# Patient Record
Sex: Female | Born: 1955 | Race: White | Hispanic: No | Marital: Single | Smoking: Current every day smoker
Health system: Southern US, Community
[De-identification: ages and names within clinical notes are randomized; demographics above are authoritative.]

## PROBLEM LIST (undated history)

## (undated) ENCOUNTER — Emergency Department (HOSPITAL_COMMUNITY): Admission: EM | Payer: Self-pay | Source: Home / Self Care

## (undated) DIAGNOSIS — R569 Unspecified convulsions: Secondary | ICD-10-CM

## (undated) DIAGNOSIS — M5020 Other cervical disc displacement, unspecified cervical region: Secondary | ICD-10-CM

## (undated) DIAGNOSIS — K859 Acute pancreatitis without necrosis or infection, unspecified: Secondary | ICD-10-CM

## (undated) DIAGNOSIS — G8929 Other chronic pain: Secondary | ICD-10-CM

## (undated) DIAGNOSIS — F32A Depression, unspecified: Secondary | ICD-10-CM

## (undated) DIAGNOSIS — K5792 Diverticulitis of intestine, part unspecified, without perforation or abscess without bleeding: Secondary | ICD-10-CM

## (undated) DIAGNOSIS — F1020 Alcohol dependence, uncomplicated: Secondary | ICD-10-CM

## (undated) DIAGNOSIS — F329 Major depressive disorder, single episode, unspecified: Secondary | ICD-10-CM

## (undated) DIAGNOSIS — F419 Anxiety disorder, unspecified: Secondary | ICD-10-CM

## (undated) HISTORY — PX: CERVICAL FUSION: SHX112

## (undated) HISTORY — PX: CHOLECYSTECTOMY: SHX55

## (undated) HISTORY — PX: CARPAL TUNNEL RELEASE: SHX101

## (undated) HISTORY — PX: ANKLE SURGERY: SHX546

## (undated) HISTORY — PX: KNEE SURGERY: SHX244

## (undated) HISTORY — PX: ABDOMINAL HYSTERECTOMY: SHX81

---

## 1998-02-06 ENCOUNTER — Ambulatory Visit (HOSPITAL_COMMUNITY): Admission: RE | Admit: 1998-02-06 | Discharge: 1998-02-06 | Payer: Self-pay | Admitting: *Deleted

## 1998-07-07 ENCOUNTER — Emergency Department (HOSPITAL_COMMUNITY): Admission: EM | Admit: 1998-07-07 | Discharge: 1998-07-07 | Payer: Self-pay | Admitting: Emergency Medicine

## 1998-07-11 ENCOUNTER — Encounter: Payer: Self-pay | Admitting: Gastroenterology

## 1998-07-11 ENCOUNTER — Inpatient Hospital Stay (HOSPITAL_COMMUNITY): Admission: EM | Admit: 1998-07-11 | Discharge: 1998-07-14 | Payer: Self-pay | Admitting: Gastroenterology

## 1998-07-12 ENCOUNTER — Encounter: Payer: Self-pay | Admitting: Gastroenterology

## 1998-08-16 ENCOUNTER — Encounter: Payer: Self-pay | Admitting: Gastroenterology

## 1998-08-16 ENCOUNTER — Ambulatory Visit (HOSPITAL_COMMUNITY): Admission: RE | Admit: 1998-08-16 | Discharge: 1998-08-16 | Payer: Self-pay | Admitting: Gastroenterology

## 1998-08-17 ENCOUNTER — Emergency Department (HOSPITAL_COMMUNITY): Admission: EM | Admit: 1998-08-17 | Discharge: 1998-08-17 | Payer: Self-pay | Admitting: Emergency Medicine

## 1998-08-17 ENCOUNTER — Encounter: Payer: Self-pay | Admitting: Emergency Medicine

## 1998-08-21 ENCOUNTER — Encounter: Payer: Self-pay | Admitting: Gastroenterology

## 1998-08-21 ENCOUNTER — Ambulatory Visit (HOSPITAL_COMMUNITY): Admission: RE | Admit: 1998-08-21 | Discharge: 1998-08-21 | Payer: Self-pay | Admitting: Gastroenterology

## 2002-01-29 ENCOUNTER — Emergency Department (HOSPITAL_COMMUNITY): Admission: EM | Admit: 2002-01-29 | Discharge: 2002-01-29 | Payer: Self-pay | Admitting: Emergency Medicine

## 2002-03-13 ENCOUNTER — Emergency Department (HOSPITAL_COMMUNITY): Admission: EM | Admit: 2002-03-13 | Discharge: 2002-03-13 | Payer: Self-pay | Admitting: Emergency Medicine

## 2002-03-13 ENCOUNTER — Encounter: Payer: Self-pay | Admitting: Emergency Medicine

## 2002-03-21 ENCOUNTER — Emergency Department (HOSPITAL_COMMUNITY): Admission: EM | Admit: 2002-03-21 | Discharge: 2002-03-21 | Payer: Self-pay | Admitting: Emergency Medicine

## 2002-04-03 ENCOUNTER — Emergency Department (HOSPITAL_COMMUNITY): Admission: EM | Admit: 2002-04-03 | Discharge: 2002-04-03 | Payer: Self-pay | Admitting: Emergency Medicine

## 2002-10-22 ENCOUNTER — Emergency Department (HOSPITAL_COMMUNITY): Admission: EM | Admit: 2002-10-22 | Discharge: 2002-10-22 | Payer: Self-pay | Admitting: Emergency Medicine

## 2002-11-05 ENCOUNTER — Encounter: Payer: Self-pay | Admitting: *Deleted

## 2002-11-05 ENCOUNTER — Emergency Department (HOSPITAL_COMMUNITY): Admission: EM | Admit: 2002-11-05 | Discharge: 2002-11-05 | Payer: Self-pay | Admitting: Emergency Medicine

## 2002-12-10 ENCOUNTER — Emergency Department (HOSPITAL_COMMUNITY): Admission: EM | Admit: 2002-12-10 | Discharge: 2002-12-10 | Payer: Self-pay | Admitting: Emergency Medicine

## 2002-12-13 ENCOUNTER — Ambulatory Visit (HOSPITAL_BASED_OUTPATIENT_CLINIC_OR_DEPARTMENT_OTHER): Admission: RE | Admit: 2002-12-13 | Discharge: 2002-12-13 | Payer: Self-pay | Admitting: Orthopedic Surgery

## 2002-12-16 ENCOUNTER — Emergency Department (HOSPITAL_COMMUNITY): Admission: EM | Admit: 2002-12-16 | Discharge: 2002-12-16 | Payer: Self-pay | Admitting: Emergency Medicine

## 2002-12-16 ENCOUNTER — Encounter: Payer: Self-pay | Admitting: Emergency Medicine

## 2002-12-23 ENCOUNTER — Emergency Department (HOSPITAL_COMMUNITY): Admission: EM | Admit: 2002-12-23 | Discharge: 2002-12-23 | Payer: Self-pay | Admitting: Emergency Medicine

## 2002-12-28 ENCOUNTER — Emergency Department (HOSPITAL_COMMUNITY): Admission: EM | Admit: 2002-12-28 | Discharge: 2002-12-28 | Payer: Self-pay | Admitting: Emergency Medicine

## 2002-12-28 ENCOUNTER — Emergency Department (HOSPITAL_COMMUNITY): Admission: EM | Admit: 2002-12-28 | Discharge: 2002-12-29 | Payer: Self-pay | Admitting: *Deleted

## 2002-12-28 ENCOUNTER — Encounter: Payer: Self-pay | Admitting: Emergency Medicine

## 2003-01-13 ENCOUNTER — Emergency Department (HOSPITAL_COMMUNITY): Admission: EM | Admit: 2003-01-13 | Discharge: 2003-01-13 | Payer: Self-pay | Admitting: Emergency Medicine

## 2003-02-02 ENCOUNTER — Emergency Department (HOSPITAL_COMMUNITY): Admission: EM | Admit: 2003-02-02 | Discharge: 2003-02-02 | Payer: Self-pay | Admitting: Emergency Medicine

## 2003-02-03 ENCOUNTER — Encounter: Payer: Self-pay | Admitting: Emergency Medicine

## 2003-02-16 ENCOUNTER — Encounter: Payer: Self-pay | Admitting: Emergency Medicine

## 2003-02-16 ENCOUNTER — Emergency Department (HOSPITAL_COMMUNITY): Admission: EM | Admit: 2003-02-16 | Discharge: 2003-02-16 | Payer: Self-pay | Admitting: Emergency Medicine

## 2003-03-19 ENCOUNTER — Emergency Department (HOSPITAL_COMMUNITY): Admission: EM | Admit: 2003-03-19 | Discharge: 2003-03-19 | Payer: Self-pay | Admitting: Emergency Medicine

## 2003-03-25 ENCOUNTER — Emergency Department (HOSPITAL_COMMUNITY): Admission: EM | Admit: 2003-03-25 | Discharge: 2003-03-25 | Payer: Self-pay | Admitting: Emergency Medicine

## 2003-05-13 ENCOUNTER — Emergency Department (HOSPITAL_COMMUNITY): Admission: EM | Admit: 2003-05-13 | Discharge: 2003-05-13 | Payer: Self-pay | Admitting: Emergency Medicine

## 2005-04-10 ENCOUNTER — Emergency Department (HOSPITAL_COMMUNITY): Admission: EM | Admit: 2005-04-10 | Discharge: 2005-04-10 | Payer: Self-pay | Admitting: Emergency Medicine

## 2005-04-22 ENCOUNTER — Emergency Department (HOSPITAL_COMMUNITY): Admission: EM | Admit: 2005-04-22 | Discharge: 2005-04-22 | Payer: Self-pay | Admitting: Emergency Medicine

## 2005-04-23 ENCOUNTER — Emergency Department (HOSPITAL_COMMUNITY): Admission: EM | Admit: 2005-04-23 | Discharge: 2005-04-23 | Payer: Self-pay | Admitting: Emergency Medicine

## 2005-04-27 ENCOUNTER — Emergency Department (HOSPITAL_COMMUNITY): Admission: EM | Admit: 2005-04-27 | Discharge: 2005-04-27 | Payer: Self-pay | Admitting: Emergency Medicine

## 2005-05-01 ENCOUNTER — Emergency Department (HOSPITAL_COMMUNITY): Admission: EM | Admit: 2005-05-01 | Discharge: 2005-05-02 | Payer: Self-pay | Admitting: Emergency Medicine

## 2005-05-03 ENCOUNTER — Emergency Department (HOSPITAL_COMMUNITY): Admission: EM | Admit: 2005-05-03 | Discharge: 2005-05-04 | Payer: Self-pay | Admitting: *Deleted

## 2005-05-05 ENCOUNTER — Inpatient Hospital Stay (HOSPITAL_COMMUNITY): Admission: EM | Admit: 2005-05-05 | Discharge: 2005-05-11 | Payer: Self-pay | Admitting: Emergency Medicine

## 2005-05-16 ENCOUNTER — Emergency Department (HOSPITAL_COMMUNITY): Admission: EM | Admit: 2005-05-16 | Discharge: 2005-05-16 | Payer: Self-pay | Admitting: Emergency Medicine

## 2005-05-20 ENCOUNTER — Emergency Department (HOSPITAL_COMMUNITY): Admission: EM | Admit: 2005-05-20 | Discharge: 2005-05-20 | Payer: Self-pay | Admitting: Emergency Medicine

## 2005-06-09 ENCOUNTER — Emergency Department (HOSPITAL_COMMUNITY): Admission: EM | Admit: 2005-06-09 | Discharge: 2005-06-10 | Payer: Self-pay | Admitting: Emergency Medicine

## 2005-07-07 ENCOUNTER — Emergency Department (HOSPITAL_COMMUNITY): Admission: EM | Admit: 2005-07-07 | Discharge: 2005-07-08 | Payer: Self-pay | Admitting: Emergency Medicine

## 2005-11-06 ENCOUNTER — Emergency Department (HOSPITAL_COMMUNITY): Admission: EM | Admit: 2005-11-06 | Discharge: 2005-11-06 | Payer: Self-pay | Admitting: Emergency Medicine

## 2005-12-08 ENCOUNTER — Emergency Department (HOSPITAL_COMMUNITY): Admission: EM | Admit: 2005-12-08 | Discharge: 2005-12-09 | Payer: Self-pay | Admitting: Emergency Medicine

## 2005-12-28 ENCOUNTER — Emergency Department (HOSPITAL_COMMUNITY): Admission: EM | Admit: 2005-12-28 | Discharge: 2005-12-29 | Payer: Self-pay | Admitting: Emergency Medicine

## 2006-01-31 ENCOUNTER — Inpatient Hospital Stay (HOSPITAL_COMMUNITY): Admission: EM | Admit: 2006-01-31 | Discharge: 2006-02-04 | Payer: Self-pay | Admitting: Emergency Medicine

## 2006-02-04 ENCOUNTER — Inpatient Hospital Stay (HOSPITAL_COMMUNITY): Admission: RE | Admit: 2006-02-04 | Discharge: 2006-02-11 | Payer: Self-pay | Admitting: Psychiatry

## 2006-02-05 ENCOUNTER — Ambulatory Visit: Payer: Self-pay | Admitting: Psychiatry

## 2006-02-17 ENCOUNTER — Encounter: Payer: Self-pay | Admitting: Gastroenterology

## 2006-02-23 ENCOUNTER — Emergency Department (HOSPITAL_COMMUNITY): Admission: EM | Admit: 2006-02-23 | Discharge: 2006-02-23 | Payer: Self-pay | Admitting: Emergency Medicine

## 2006-10-22 ENCOUNTER — Encounter: Payer: Self-pay | Admitting: Emergency Medicine

## 2006-10-23 ENCOUNTER — Inpatient Hospital Stay (HOSPITAL_COMMUNITY): Admission: EM | Admit: 2006-10-23 | Discharge: 2006-10-27 | Payer: Self-pay | Admitting: Internal Medicine

## 2007-01-03 ENCOUNTER — Emergency Department (HOSPITAL_COMMUNITY): Admission: EM | Admit: 2007-01-03 | Discharge: 2007-01-03 | Payer: Self-pay | Admitting: Emergency Medicine

## 2007-01-03 ENCOUNTER — Inpatient Hospital Stay (HOSPITAL_COMMUNITY): Admission: AD | Admit: 2007-01-03 | Discharge: 2007-01-07 | Payer: Self-pay | Admitting: Psychiatry

## 2007-01-03 ENCOUNTER — Ambulatory Visit: Payer: Self-pay | Admitting: Psychiatry

## 2007-01-06 ENCOUNTER — Emergency Department (HOSPITAL_COMMUNITY): Admission: EM | Admit: 2007-01-06 | Discharge: 2007-01-06 | Payer: Self-pay | Admitting: Emergency Medicine

## 2007-04-14 ENCOUNTER — Emergency Department (HOSPITAL_COMMUNITY): Admission: EM | Admit: 2007-04-14 | Discharge: 2007-04-14 | Payer: Self-pay | Admitting: Emergency Medicine

## 2007-07-26 ENCOUNTER — Emergency Department (HOSPITAL_COMMUNITY): Admission: EM | Admit: 2007-07-26 | Discharge: 2007-07-26 | Payer: Self-pay | Admitting: Emergency Medicine

## 2007-08-08 ENCOUNTER — Emergency Department (HOSPITAL_COMMUNITY): Admission: EM | Admit: 2007-08-08 | Discharge: 2007-08-08 | Payer: Self-pay | Admitting: Family Medicine

## 2008-02-26 ENCOUNTER — Emergency Department (HOSPITAL_COMMUNITY): Admission: EM | Admit: 2008-02-26 | Discharge: 2008-02-26 | Payer: Self-pay | Admitting: Emergency Medicine

## 2008-03-02 ENCOUNTER — Emergency Department (HOSPITAL_COMMUNITY): Admission: EM | Admit: 2008-03-02 | Discharge: 2008-03-02 | Payer: Self-pay | Admitting: Emergency Medicine

## 2008-03-10 ENCOUNTER — Emergency Department (HOSPITAL_COMMUNITY): Admission: EM | Admit: 2008-03-10 | Discharge: 2008-03-10 | Payer: Self-pay | Admitting: Emergency Medicine

## 2009-04-01 ENCOUNTER — Emergency Department (HOSPITAL_COMMUNITY): Admission: EM | Admit: 2009-04-01 | Discharge: 2009-04-01 | Payer: Self-pay | Admitting: Emergency Medicine

## 2009-06-25 ENCOUNTER — Emergency Department (HOSPITAL_BASED_OUTPATIENT_CLINIC_OR_DEPARTMENT_OTHER): Admission: EM | Admit: 2009-06-25 | Discharge: 2009-06-25 | Payer: Self-pay | Admitting: Emergency Medicine

## 2009-06-27 ENCOUNTER — Emergency Department (HOSPITAL_COMMUNITY): Admission: EM | Admit: 2009-06-27 | Discharge: 2009-06-29 | Payer: Self-pay | Admitting: Emergency Medicine

## 2009-10-18 ENCOUNTER — Emergency Department (HOSPITAL_COMMUNITY): Admission: EM | Admit: 2009-10-18 | Discharge: 2009-10-18 | Payer: Self-pay | Admitting: Emergency Medicine

## 2009-10-22 ENCOUNTER — Inpatient Hospital Stay (HOSPITAL_COMMUNITY): Admission: EM | Admit: 2009-10-22 | Discharge: 2009-10-26 | Payer: Self-pay | Admitting: Emergency Medicine

## 2009-12-06 ENCOUNTER — Emergency Department (HOSPITAL_COMMUNITY): Admission: EM | Admit: 2009-12-06 | Discharge: 2009-12-06 | Payer: Self-pay | Admitting: Emergency Medicine

## 2010-01-23 ENCOUNTER — Emergency Department (HOSPITAL_COMMUNITY): Admission: EM | Admit: 2010-01-23 | Discharge: 2010-01-24 | Payer: Self-pay | Admitting: Emergency Medicine

## 2010-04-10 ENCOUNTER — Emergency Department (HOSPITAL_COMMUNITY): Admission: EM | Admit: 2010-04-10 | Discharge: 2010-04-10 | Payer: Self-pay | Admitting: Emergency Medicine

## 2011-01-04 LAB — POCT I-STAT, CHEM 8
Chloride: 104 mEq/L (ref 96–112)
Creatinine, Ser: 0.7 mg/dL (ref 0.4–1.2)
Glucose, Bld: 85 mg/dL (ref 70–99)
Potassium: 3.8 mEq/L (ref 3.5–5.1)

## 2011-01-04 LAB — DIFFERENTIAL
Basophils Relative: 1 % (ref 0–1)
Basophils Relative: 1 % (ref 0–1)
Eosinophils Absolute: 0.4 10*3/uL (ref 0.0–0.7)
Monocytes Absolute: 0.2 10*3/uL (ref 0.1–1.0)
Monocytes Absolute: 0.3 10*3/uL (ref 0.1–1.0)
Monocytes Relative: 4 % (ref 3–12)
Monocytes Relative: 6 % (ref 3–12)
Neutro Abs: 1.6 10*3/uL — ABNORMAL LOW (ref 1.7–7.7)
Neutrophils Relative %: 57 % (ref 43–77)

## 2011-01-04 LAB — BASIC METABOLIC PANEL
BUN: 8 mg/dL (ref 6–23)
CO2: 30 mEq/L (ref 19–32)
CO2: 30 mEq/L (ref 19–32)
Calcium: 9.6 mg/dL (ref 8.4–10.5)
Chloride: 103 mEq/L (ref 96–112)
GFR calc Af Amer: >60 mL/min (ref >60)
GFR calc Af Amer: >60 mL/min (ref >60)
GFR calc non Af Amer: >60 mL/min (ref >60)
GFR calc non Af Amer: >60 mL/min (ref >60)
Glucose, Bld: 91 mg/dL (ref 70–99)
Glucose, Bld: 97 mg/dL (ref 70–99)
Potassium: 3.6 mEq/L (ref 3.5–5.1)
Potassium: 3.6 mEq/L (ref 3.5–5.1)
Potassium: 4.7 mEq/L (ref 3.5–5.1)
Sodium: 139 mEq/L (ref 135–145)
Sodium: 140 mEq/L (ref 135–145)
Sodium: 142 mEq/L (ref 135–145)

## 2011-01-04 LAB — CBC
HCT: 33.9 % — ABNORMAL LOW (ref 36.0–46.0)
HCT: 34.7 % — ABNORMAL LOW (ref 36.0–46.0)
Hemoglobin: 11.4 g/dL — ABNORMAL LOW (ref 12.0–15.0)
Hemoglobin: 11.5 g/dL — ABNORMAL LOW (ref 12.0–15.0)
Hemoglobin: 11.6 g/dL — ABNORMAL LOW (ref 12.0–15.0)
MCHC: 33.5 g/dL (ref 30.0–36.0)
MCHC: 33.6 g/dL (ref 30.0–36.0)
MCV: 95.3 fL (ref 78.0–100.0)
MCV: 95.4 fL (ref 78.0–100.0)
MCV: 95.6 fL (ref 78.0–100.0)
RBC: 3.39 MIL/uL — ABNORMAL LOW (ref 3.87–5.11)
RBC: 3.55 MIL/uL — ABNORMAL LOW (ref 3.87–5.11)
RBC: 3.6 MIL/uL — ABNORMAL LOW (ref 3.87–5.11)
RBC: 3.64 MIL/uL — ABNORMAL LOW (ref 3.87–5.11)
RBC: 3.96 MIL/uL (ref 3.87–5.11)
RDW: 13.8 % (ref 11.5–15.5)
RDW: 14 % (ref 11.5–15.5)
WBC: 3.7 10*3/uL — ABNORMAL LOW (ref 4.0–10.5)
WBC: 4.1 10*3/uL (ref 4.0–10.5)
WBC: 4.8 10*3/uL (ref 4.0–10.5)

## 2011-01-04 LAB — COMPREHENSIVE METABOLIC PANEL
ALT: 14 U/L (ref 0–35)
Albumin: 3.3 g/dL — ABNORMAL LOW (ref 3.5–5.2)
Alkaline Phosphatase: 72 U/L (ref 39–117)
BUN: 7 mg/dL (ref 6–23)
Potassium: 3.4 mEq/L — ABNORMAL LOW (ref 3.5–5.1)
Sodium: 144 mEq/L (ref 135–145)
Total Protein: 6.1 g/dL (ref 6.0–8.3)

## 2011-01-04 LAB — RAPID URINE DRUG SCREEN, HOSP PERFORMED
Amphetamines: NOT DETECTED
Barbiturates: NOT DETECTED
Cocaine: NOT DETECTED
Opiates: NOT DETECTED

## 2011-01-04 LAB — LIPID PANEL
HDL: 88 mg/dL (ref >39)
VLDL: 14 mg/dL (ref 0–40)

## 2011-01-04 LAB — TROPONIN I
Troponin I: 0.01 ng/mL (ref 0.00–0.06)
Troponin I: 0.01 ng/mL (ref 0.00–0.06)
Troponin I: 0.02 ng/mL (ref 0.00–0.06)

## 2011-01-04 LAB — CARDIAC PANEL(CRET KIN+CKTOT+MB+TROPI)
CK, MB: 1 ng/mL (ref 0.3–4.0)
Total CK: 48 U/L (ref 7–177)
Troponin I: 0.02 ng/mL (ref 0.00–0.06)

## 2011-01-04 LAB — CK TOTAL AND CKMB (NOT AT ARMC)
CK, MB: 1.3 ng/mL (ref 0.3–4.0)
CK, MB: 1.4 ng/mL (ref 0.3–4.0)
Relative Index: INVALID (ref 0.0–2.5)
Total CK: 60 U/L (ref 7–177)

## 2011-01-04 LAB — FERRITIN: Ferritin: 123 ng/mL (ref 10–291)

## 2011-01-04 LAB — HEMOGLOBIN A1C: Hgb A1c MFr Bld: 5.4 % (ref 4.6–6.1)

## 2011-01-04 LAB — PHOSPHORUS: Phosphorus: 3.7 mg/dL (ref 2.3–4.6)

## 2011-01-04 LAB — RETICULOCYTES: Retic Count, Absolute: 42.4 10*3/uL (ref 19.0–186.0)

## 2011-01-04 LAB — IRON AND TIBC
Iron: 59 ug/dL (ref 42–135)
TIBC: 261 ug/dL (ref 250–470)

## 2011-01-04 LAB — APTT: aPTT: 32 seconds (ref 24–37)

## 2011-01-04 LAB — VITAMIN B12: Vitamin B-12: 675 pg/mL (ref 211–911)

## 2011-01-07 LAB — URINALYSIS, ROUTINE W REFLEX MICROSCOPIC
Glucose, UA: NEGATIVE mg/dL
Ketones, ur: NEGATIVE mg/dL
Leukocytes, UA: NEGATIVE
Nitrite: NEGATIVE
Protein, ur: NEGATIVE mg/dL

## 2011-01-07 LAB — COMPREHENSIVE METABOLIC PANEL
AST: 29 U/L (ref 0–37)
Albumin: 4.6 g/dL (ref 3.5–5.2)
BUN: 6 mg/dL (ref 6–23)
Calcium: 9.3 mg/dL (ref 8.4–10.5)
Creatinine, Ser: 0.63 mg/dL (ref 0.4–1.2)
GFR calc Af Amer: >60 mL/min (ref >60)
Total Bilirubin: 0.6 mg/dL (ref 0.3–1.2)
Total Protein: 7.4 g/dL (ref 6.0–8.3)

## 2011-01-07 LAB — RAPID URINE DRUG SCREEN, HOSP PERFORMED
Benzodiazepines: NOT DETECTED
Cocaine: NOT DETECTED
Tetrahydrocannabinol: NOT DETECTED

## 2011-01-07 LAB — URINE MICROSCOPIC-ADD ON

## 2011-01-07 LAB — DIFFERENTIAL
Basophils Absolute: 0 10*3/uL (ref 0.0–0.1)
Lymphocytes Relative: 32 % (ref 12–46)
Lymphs Abs: 2.8 10*3/uL (ref 0.7–4.0)
Monocytes Absolute: 0.4 10*3/uL (ref 0.1–1.0)
Neutro Abs: 5.1 10*3/uL (ref 1.7–7.7)

## 2011-01-07 LAB — CBC
HCT: 44.9 % (ref 36.0–46.0)
MCHC: 33.6 g/dL (ref 30.0–36.0)
MCV: 92.9 fL (ref 78.0–100.0)
Platelets: 343 10*3/uL (ref 150–400)
RDW: 13.2 % (ref 11.5–15.5)

## 2011-01-19 LAB — CBC
Hemoglobin: 14.1 g/dL (ref 12.0–15.0)
MCHC: 33.6 g/dL (ref 30.0–36.0)
MCV: 95.1 fL (ref 78.0–100.0)
RBC: 4.4 MIL/uL (ref 3.87–5.11)

## 2011-01-19 LAB — URINE MICROSCOPIC-ADD ON

## 2011-01-19 LAB — URINE CULTURE

## 2011-01-19 LAB — COMPREHENSIVE METABOLIC PANEL
ALT: 15 U/L (ref 0–35)
Alkaline Phosphatase: 74 U/L (ref 39–117)
CO2: 26 mEq/L (ref 19–32)
Chloride: 112 mEq/L (ref 96–112)
GFR calc non Af Amer: >60 mL/min (ref >60)
Glucose, Bld: 77 mg/dL (ref 70–99)
Potassium: 4.1 mEq/L (ref 3.5–5.1)
Sodium: 146 mEq/L — ABNORMAL HIGH (ref 135–145)

## 2011-01-19 LAB — DIFFERENTIAL
Basophils Relative: 1 % (ref 0–1)
Eosinophils Absolute: 0.2 10*3/uL (ref 0.0–0.7)
Neutrophils Relative %: 58 % (ref 43–77)

## 2011-01-19 LAB — URINALYSIS, ROUTINE W REFLEX MICROSCOPIC
Specific Gravity, Urine: 1.016 (ref 1.005–1.030)
Urobilinogen, UA: 0.2 mg/dL (ref 0.0–1.0)
pH: 5.5 (ref 5.0–8.0)

## 2011-01-19 LAB — TROPONIN I: Troponin I: 0.01 ng/mL (ref 0.00–0.06)

## 2011-01-19 LAB — LIPASE, BLOOD: Lipase: 21 U/L (ref 11–59)

## 2011-01-19 LAB — RAPID URINE DRUG SCREEN, HOSP PERFORMED
Benzodiazepines: POSITIVE — AB
Cocaine: NOT DETECTED
Tetrahydrocannabinol: NOT DETECTED

## 2011-01-23 LAB — URINE MICROSCOPIC-ADD ON

## 2011-01-23 LAB — POCT TOXICOLOGY PANEL

## 2011-01-23 LAB — CBC
HCT: 37.6 % (ref 36.0–46.0)
Hemoglobin: 12.8 g/dL (ref 12.0–15.0)
MCHC: 34.2 g/dL (ref 30.0–36.0)
MCHC: 34.9 g/dL (ref 30.0–36.0)
MCV: 94.4 fL (ref 78.0–100.0)
MCV: 93.7 fL (ref 78.0–100.0)
Platelets: 229 10*3/uL (ref 150–400)
RBC: 3.98 MIL/uL (ref 3.87–5.11)
RBC: 4.1 MIL/uL (ref 3.87–5.11)
RDW: 13.4 % (ref 11.5–15.5)
WBC: 5.6 10*3/uL (ref 4.0–10.5)

## 2011-01-23 LAB — DIFFERENTIAL
Eosinophils Absolute: 0.1 10*3/uL (ref 0.0–0.7)
Eosinophils Absolute: 0.1 10*3/uL (ref 0.0–0.7)
Eosinophils Relative: 3 % (ref 0–5)
Lymphocytes Relative: 38 % (ref 12–46)
Lymphs Abs: 2.3 10*3/uL (ref 0.7–4.0)
Lymphs Abs: 2.1 10*3/uL (ref 0.7–4.0)
Monocytes Absolute: 0.3 10*3/uL (ref 0.1–1.0)
Monocytes Relative: 5 % (ref 3–12)
Neutro Abs: 2.3 10*3/uL (ref 1.7–7.7)
Neutrophils Relative %: 46 % (ref 43–77)

## 2011-01-23 LAB — COMPREHENSIVE METABOLIC PANEL
ALT: 33 U/L (ref 0–35)
AST: 60 U/L — ABNORMAL HIGH (ref 0–37)
Albumin: 4.4 g/dL (ref 3.5–5.2)
BUN: 9 mg/dL (ref 6–23)
CO2: 24 mEq/L (ref 19–32)
CO2: 26 mEq/L (ref 19–32)
Calcium: 8.6 mg/dL (ref 8.4–10.5)
Calcium: 9.5 mg/dL (ref 8.4–10.5)
Chloride: 105 mEq/L (ref 96–112)
Creatinine, Ser: 0.6 mg/dL (ref 0.4–1.2)
Creatinine, Ser: 0.6 mg/dL (ref 0.4–1.2)
GFR calc Af Amer: >60 mL/min (ref >60)
GFR calc non Af Amer: >60 mL/min (ref >60)
Glucose, Bld: 86 mg/dL (ref 70–99)
Sodium: 142 mEq/L (ref 135–145)
Total Protein: 6.7 g/dL (ref 6.0–8.3)

## 2011-01-23 LAB — URINALYSIS, ROUTINE W REFLEX MICROSCOPIC
Bilirubin Urine: NEGATIVE
Glucose, UA: NEGATIVE mg/dL
Protein, ur: NEGATIVE mg/dL
Specific Gravity, Urine: 1.003 — ABNORMAL LOW (ref 1.005–1.030)
Urobilinogen, UA: 0.2 mg/dL (ref 0.0–1.0)
pH: 6 (ref 5.0–8.0)
pH: 6 (ref 5.0–8.0)

## 2011-01-23 LAB — HEMOCCULT GUIAC POC 1CARD (OFFICE): Fecal Occult Bld: NEGATIVE

## 2011-01-23 LAB — RAPID URINE DRUG SCREEN, HOSP PERFORMED
Amphetamines: NOT DETECTED
Barbiturates: NOT DETECTED
Benzodiazepines: NOT DETECTED
Cocaine: NOT DETECTED
Opiates: NOT DETECTED

## 2011-01-23 LAB — LIPASE, BLOOD: Lipase: 20 U/L (ref 11–59)

## 2011-01-23 LAB — URINE CULTURE

## 2011-01-23 LAB — ACETAMINOPHEN LEVEL: Acetaminophen (Tylenol), Serum: <10 ug/mL — ABNORMAL LOW (ref 10–30)

## 2011-01-23 LAB — SALICYLATE LEVEL: Salicylate Lvl: <4 mg/dL (ref 2.8–20.0)

## 2011-01-26 LAB — URINALYSIS, ROUTINE W REFLEX MICROSCOPIC
Bilirubin Urine: NEGATIVE
Ketones, ur: NEGATIVE mg/dL
Protein, ur: NEGATIVE mg/dL
Specific Gravity, Urine: 1.001 — ABNORMAL LOW (ref 1.005–1.030)
Urobilinogen, UA: 0.2 mg/dL (ref 0.0–1.0)

## 2011-01-26 LAB — URINE MICROSCOPIC-ADD ON

## 2011-01-26 LAB — URINE CULTURE: Colony Count: >=100000

## 2011-03-03 NOTE — Op Note (Signed)
[name], [name]              ACCOUNT NO.:  [id number]   MEDICAL RECORD NO.:  [id number]          PATIENT TYPE:  EMS   LOCATION:  MINO                         FACILITY:  [facility]   PHYSICIAN:  Harvie Junior, M.D.   DATE OF BIRTH:  07-Feb-1956   DATE OF PROCEDURE:  03/09/2008  DATE OF DISCHARGE:  03/10/2008                               OPERATIVE REPORT   PREOPERATIVE DIAGNOSIS:  Carpal tunnel syndrome, right.   POSTOPERATIVE DIAGNOSIS:  Carpal tunnel syndrome, right.   PRINCIPAL PROCEDURE:  Right carpal tunnel release....   SURGEON:  Harvie Junior, MD.   ASSISTANT:  Marshia Ly, P.A.   ANESTHESIA:  General.   BRIEF HISTORY:  [name] is a female with a long history of having  had work-related injuries, which ultimately led to her making a  diagnosis of carpal tunnel syndrome, which was treated conservatively  for a period of time.  Because of the failure of conservative care, she  is also taken to the operating room for a right carpal tunnel release...   PROCEDURE:  The patient was taken to the operating room, and after  adequate anesthesia was obtained with general anesthetic, the patient  was placed supine on the operating table.  The right arm was prepped and  draped in the usual sterile fashion.  Following this, a curved incision  was made just ulnar to the midline wrist crease, subcutaneous tissue,  taken down to the level of the lower carpal ligaments clearly  identified.  A small rent was made.  A freer elevator was used to make  sure the nerve was not adherent on the undersurface, and then the volar  carpal ligament was extended proximally and distally.  The nerve was  identified.  The motor branch of the nerve was identified.  The wound  was copiously and thoroughly irrigated and suction dry.  The skin was  then closed with a combination of interrupted and running 4-0 nylon  suture.  A sterile compressive dressing was applied as well as a volar  plaster.   The patient was taken to the recovery room and was noted to be  in a satisfactory condition.  Estimated blood loss for this procedure  was none.      Harvie Junior, M.D.     Ranae Plumber  D:  03/28/2008  T:  03/29/2008  Job:  [id number]

## 2011-03-06 NOTE — Op Note (Signed)
NAME:  [name], RAUTIO                        ACCOUNT NO.:  [id number]   MEDICAL RECORD NO.:  [id number]                   PATIENT TYPE:  AMB   LOCATION:  DSC                                  FACILITY:  [facility]   PHYSICIAN:  Feliberto Gottron. Turner Daniels, M.D.                DATE OF BIRTH:  [birth year]   DATE OF PROCEDURE:  12/13/2002  DATE OF DISCHARGE:                                 OPERATIVE REPORT   PREOPERATIVE DIAGNOSES:  Right knee medial meniscal tear.   POSTOPERATIVE DIAGNOSES:  Right knee chondromalacia patella, grade 3.   OPERATION PERFORMED:  Right knee arthroscopic debridement of chondromalacia,  grade 3 from the patella.   SURGEON:  Feliberto Gottron. Turner Daniels, M.D.   ASSISTANTLaural Benes. Jannet Mantis.   ANESTHESIA:  General LMA.   ESTIMATED BLOOD LOSS:  Minimal.   FLUIDS REPLACED:  1L crystalloids.   DRAINS:  None.   TOURNIQUET TIME:  None.   INDICATIONS FOR PROCEDURE:  The patient is a 55 year old woman who sustained  a blow to the anterior aspect of the right knee while at work.  There was a  laceration that went on to heal.  She continued to have peripatellar knee  pain.  MRI scan was accomplished, showing some evidence of chondromalacia  patella and supposedly a posterior horn medial meniscal tear.  I read the  MRI scan and it was consistent with a posterior horn medial meniscal tear.  She was taken for surgical intervention having failed conservative treatment  with anti-inflammatory medicines, observation and exercises.   DESCRIPTION OF PROCEDURE:  The patient was identified by arm band and take  to the operating room at [facility].  Appropriate  anesthetic monitors were attached.  General LMA anesthesia was induced with  the patient in the supine position.  Lateral post applied to the table.  The  right lower extremity was prepped and draped in the usual sterile fashion  from the ankle to the midthigh and the inferomedial and inferolateral  peripatellar  portal region was infiltrated with 2 to 3 cc of 0.5% Marcaine  with epinephrine solution and then standard portals were made with a #11  blade in the inferomedial and inferolateral peripatellar regions.  The  arthroscope was introduced through the inferolateral portal, the outflow  through the inferomedial portal and diagnostic arthroscopy revealed grade 3  chondromalacia of the patella, somewhat extensive requiring debridement with  a 3.5 gator sucker shaver back to stable margins along the apex then the  medial facet.  The trochlea, medial and lateral compartments were in  excellent condition.  There was some grade 1 chondromalacia of the lateral  tibial condyle but this did not require debridement.  The menisci were  thoroughly probed and found to be stable and despite looking at the medial  meniscus from both the medial and lateral portals, no significant tearing of  the medial meniscus  was noted.  Photographic documentation was made of these  findings.  The gutters were cleared.  The scope was taken to the medial side  of the PCL clearing the posterior compartment.  At this point the knee was  washed out with normal saline solution, the arthroscopic instruments  removed.  A dressing of Xeroform, 4 x 4 dressing sponges, Webril and Ace  wrap was applied.  The patient was awakened and taken to the recovery room  without difficulty.                                                 Feliberto Gottron. Turner Daniels, M.D.    Ovid Curd  D:  12/13/2002  T:  12/13/2002  Job:  [id number]

## 2011-03-06 NOTE — Discharge Summary (Signed)
[name], [name]              ACCOUNT NO.:  [id number]   MEDICAL RECORD NO.:  [id number]          PATIENT TYPE:  INP   LOCATION:  4742                         FACILITY:  [facility]   PHYSICIAN:  Michaelyn Barter, M.D. DATE OF BIRTH:  [birth year]   DATE OF ADMISSION:  01/30/2006  DATE OF DISCHARGE:  02/04/2006                                 DISCHARGE SUMMARY   PRIMARY CARE PHYSICIAN:  Unassigned.   FINAL DIAGNOSES:  1.  Alcohol abuse.  2.  Depression.  3.  Suicidal ideation.  4.  Right arm pain.   SECONDARY DIAGNOSIS:  Hypertension.   PROCEDURES:  1.  X-ray of the right humerus, January 30, 2006.  2.  MRI of the right shoulder, February 02, 2006.   HISTORY OF PRESENT ILLNESS:  [name] is a 55 year old female who states  that approximately 10 months ago she lost her job, her car and her house.  After this, she began to drink heavily.  For at least the past 10 months  prior to this admission, she has drank at least one 6-pack per day.  On some  days, she has consumed up to two 6-packs per day.  She indicated that she  felt as though she wanted to stop this.  On the date of her admission, she  complained of some suicidal ideation.  She complained of feeling worthless,  and she requested help for her alcohol abuse.  In addition, she complained  of some chest pain which was characterized as sharp in nature.  No radiation  was accompanied by this.  For past medical history, please see note dictated  by Dr. Freeman Caldron on January 31, 2006.   HOSPITAL COURSE:  1.  Alcohol intoxication.  The patient was admitted to telemetry bed and      started on a detox protocol using Ativan.  IV fluids were also provided      to the patient.  The patient did not demonstrate any symptoms of alcohol      withdrawal or delirium tremens over the course of her hospitalization.      She did, however, complain of some shakes, but otherwise, she manifested      no other symptoms or signs of hallucinations or  signs of full blown      withdrawal.  2.  Right shoulder pain.  The patient had an x-ray completed of her right      humerus which revealed no acute findings and mild degenerative joint      disease.  She requested to have an MRI completed of her right shoulder.      The impression was that there was a bone contusion of the greater      tuberosity.  Subacromial and subdeltoid bursitis which is probably post      traumatic.  Small glenohumeral joint effusion which is nonspecific but      probably represents some mild post-traumatic synovitis.  She received      p.r.n. medications for her shoulder pain throughout the course of      hospitalization  3.  Chest pain.  When the patient initially arrived into the hospital, she      complained of some chest pain.  Cardiac enzymes were drawn, and her CK-      MBs were negative.  She did not complain of any other additional chest      pain throughout the course of her hospitalization.  4.  Depression/suicidal ideation.  Psychiatry was consulted, and Dr.      Providence Crosby nurse practitioner responded to the consultation.  The      patient was provided with a one-to-one sitter during her      hospitalization.   CONDITION AT THE TIME OF DISCHARGE:  Was stable.  On the date of discharge,  the patient indicated that she felt better.  Her shoulder was less painful,  and she had complained of some abdominal discomfort; however, that had  resolved.  The patient was transferred to Behavior[facility] at [facility].      Michaelyn Barter, M.D.  Electronically Signed     OR/MEDQ  D:  03/19/2006  T:  03/19/2006  Job:  [id number]

## 2011-03-06 NOTE — Discharge Summary (Signed)
[name], SCHREURS              ACCOUNT NO.:  [id number]   MEDICAL RECORD NO.:  [id number]          PATIENT TYPE:  IPS   LOCATION:  0506                          FACILITY:  [facility]   PHYSICIAN:  Anselm Jungling, MD  DATE OF BIRTH:  Nov 12, 1955   DATE OF ADMISSION:  02/04/2006  DATE OF DISCHARGE:  02/11/2006                                 DISCHARGE SUMMARY   IDENTIFYING DATA/REASON FOR ADMISSION:  The patient is a 55 year old  separated Caucasian female, homeless, with a history of chronic alcoholism  and depression.  She was admitted for alcohol detoxification and treatment  of depression.  She indicated that she had hit rock bottom.  Her husband  had lost her job approximately a year ago, and they had lost her home.  They  subsequently separated, and the patient had been living in a tent for 10  months.  She had previously been treated with Prozac with relatively good  results but had not been on any medication for 10 months.  Her last  alcoholic drink was four days prior to admission.  She had also injured her  shoulder shortly prior to admission.  Please refer to the admission note for  further details pertaining to the symptoms, circumstances and history that  led to her hospitalization.   INITIAL DIAGNOSTIC IMPRESSION:  She was given an initial AXIS I diagnosis of  alcohol dependence, and major depressive disorder recurrent.   MEDICAL/LABORATORY:  The patient was medically and physically assessed by  the psychiatric nurse practitioner upon admission.  She came to [place] with her  shoulder injury, for which she was given Percocet p.r.n. pain.  The patient  was also given Protonix 40 mg daily for GERD, and her usual Premarin 0.3 mg  daily.  There were no significant acute medical issues during her inpatient  psychiatric stay.   HOSPITAL COURSE:  The patient was admitted to the adult inpatient  psychiatric service.  She was placed on a Librium withdrawal protocol and  restarted on  Prozac 20 mg daily.  Later in her stay, Risperdal 0.25 mg  b.i.d. was added to her regimen to address ongoing anxiety that continued  beyond what appeared to be the end of her alcohol detoxification.   She presented as a well-nourished, well-developed, pleasant but very  depressed and sad woman.  Her thoughts and speech were normally organized.  There were no signs or symptoms of psychosis.  She was not exhibiting  suicidal ideation at any time during her stay.  She very much wanted help,  and to get into long term sobriety.   Her hospital course proceeded normally and uneventfully.  At the time of  discharge, she was free of any alcohol withdrawal symptoms and was reporting  good benefit from Risperdal.  She felt ready to continue in outpatient  treatment.  She was given assistance in finding an appropriate, safe  residential situation.   AFTERCARE:  The patient was discharged with a plan to follow-up at the  [facility] for psychiatric medication management on February 15, 2006.   DISCHARGE MEDICATIONS:  1.  Protonix 40 mg daily.  2.  Prozac 20 mg daily.  3.  Premarin 0.3 mg daily.  4.  Risperdal 0.25 mg b.i.d.  5.  Percocet 2 tablets every eight hours as needed for pain.   She was instructed to follow-up with [facility] for shoulder evaluation  following discharge.   DISCHARGE DIAGNOSES:  AXIS I:  Alcohol dependence, early remission.  Major  depressive disorder, recurrent without psychotic features.  AXIS II:  Deferred.  AXIS III:  No acute or chronic illnesses, shoulder injury, status post.  AXIS IV:  Stressors:  Severe.  AXIS V:  GAF on discharge 60.           ______________________________  Anselm Jungling, MD  Electronically Signed     SPB/MEDQ  D:  02/12/2006  T:  02/13/2006  Job:  [id number]

## 2011-03-06 NOTE — H&P (Signed)
[name], [name]              ACCOUNT NO.:  [id number]   MEDICAL RECORD NO.:  [id number]          PATIENT TYPE:  INP   LOCATION:  1829                         FACILITY:  [facility]   PHYSICIAN:  Toby L. Fugate, D.O.   DATE OF BIRTH:  11/02/1955   DATE OF ADMISSION:  01/30/2006  DATE OF DISCHARGE:                                HISTORY & PHYSICAL   PRIMARY CARE PHYSICIAN:  None.   HISTORY OF PRESENT ILLNESS:  [name] is a 55 year old Caucasian female  who has a known history of alcohol dependency. She quit drinking several  years ago. Back about 10 months ago she lost her car, her house, and her  job. After that she began to drink again.  Since that time she has drank at  least one six pack each day. On some days she has had two sick packs. Most  recently over the past week or two she has been begun to have feelings of  suicidal  ideation. She feels like she cannot go on. As a result she came to  the ED. She currently denies suicidal or homicidal ideation. She does have  feelings of worthlessness. She states that she truly needs help for her  alcohol abuse. She does complain of some chest pain. The chest pain is sharp  in nature and there is no radiation and there are no associated symptoms.  The patient says that when she tries to stop drinking she does get the  shakes.   PAST MEDICAL HISTORY:  1.  Colitis.  2.  Gastritis.  3.  Hypertension.   MEDICATIONS:  None.   ALLERGIES:  None.   SOCIAL HISTORY:  The patient drinks one to two six packs a day. She does  smoke one to two packs of cigarettes per day. She denies IV drug abuse. She  is homeless. She occasionally uses marijuana.   FAMILY HISTORY:  Noncontributory.   REVIEW OF SYSTEMS:  A complete 12-point review of systems is obtained and  review is negative except for that stated in the HPI.   PHYSICAL EXAMINATION:  VITAL SIGNS: Temperature 98.4, blood pressure 121/78,  pulse 78, respirations 18.  HEENT: Pupils  equal, round, and reactive to light. Extraocular movements  intact. No scleral icterus. Oropharynx is clear, moist. No erythema or  thrush.  NECK: No JVD. No carotid bruits. No adenopathy.  HEART: Regular rate and rhythm without murmurs, rubs, or gallops.  LUNGS: Clear to auscultation bilaterally with no rales, rhonchi, or wheezes.  ABDOMEN: Positive bowel sounds, nontender, nondistended.  EXTREMITIES: No clubbing, cyanosis, or edema.  NEUROLOGIC: Cranial nerves II-XII intact. No focal deficits. DTRs 2/4 in all  extremities. Strength 5/5 in all extremities.   LABORATORY DATA:  Sodium 140, potassium 3.9, chloride 107, CO2 25, glucose  84, BUN 6, creatinine 0.7, calcium 9.5, total protein 6.9, albumin 4.1, AST  41, ALT 27, amylase 98, lipase 35. Alcohol level 44. Troponin negative times  one. Urine drug screen positive for marijuana.   EKG shows normal sinus rhythm with left anterior fascicular block.   ASSESSMENT/PLAN:  1.  Alcohol intoxication  with history of delirium tremens. I will admit the      patient to a telemetry bed. I will start DT protocol using Ativan. I      would provide IV fluids.  2.  Chest pain. Likely this is due to anxiety. However, cardiac etiology      cannot be ruled out at this time. I will follow cardiac enzymes. I would      repeat an EKG tomorrow.  3.  Hypertension. There is decent control at this point and I will monitor.  4.  The patient is a full code.      Toby L. Fugate, D.O.  Electronically Signed     TLF/MEDQ  D:  01/31/2006  T:  01/31/2006  Job:  [id number]
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
## 2011-03-06 NOTE — H&P (Signed)
[name], NOVIELLO NO.:  [id number]   MEDICAL RECORD NO.:  [id number]          PATIENT TYPE:  IPS   LOCATION:  0508                          FACILITY:  [facility]   PHYSICIAN:  Geoffery Lyons, M.D.      DATE OF BIRTH:  1956/02/25   DATE OF ADMISSION:  01/03/2007  DATE OF DISCHARGE:                       PSYCHIATRIC ADMISSION ASSESSMENT   IDENTIFYING INFORMATION:  This is a voluntary admission to the services  of Dr. Geoffery Lyons.  This is a 55 year old divorced white female.  She  presented to the ED at [facility] at approximately 12:30 on  January 03, 2007.  She stated that she had been experiencing nausea,  vomiting and diarrhea for two days.  She states I don't feel right at  all.  She has also been drinking a 12-pack a day, requesting rehab.  She also complained of feeling dizzy and lightheaded.  In the emergency  room, her UDS was negative.  Her alcohol level was 196.  She was noted  to have a large amount of hemoglobin in her urine and also she was noted  to be homeless.  There was no sponsor available.  The [facility]  money is used up for the month and, hence, she was admitted on an  indigent basis.  Ms. Wherley was most recently hosted by the [facility]  system October 23, 2006 to October 27, 2006 when she was admitted to the  main hospital for treatment of alcoholism by Ativan taper.  After three  days on the medicine service, she was ready for admission to rehab.  However, she refused to go and was released to the care of her daughter.   PAST PSYCHIATRIC HISTORY:  As already stated, her most recent detox that  she acknowledges is October 23, 2006 to October 27, 2006.  She states she  was in the hospital in [place] in February.  She states that that  hospitalization was for a staph infection of her right arm.  She did not  mention alcohol but it is hard to know.  She was also with [place] in 2007,  April 19th to April 26th.  She has not been to [facility] for at least 18 months.   SOCIAL HISTORY:  She went to the 10th grade.  She has a GED.  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  She has been married and divorced four  times.  She has a son 42, a daughter 68.  She states that she is  homeless with no income because no one will let her live with them.   FAMILY HISTORY:  She states her father was an alcoholic.   ALCOHOL/DRUG HISTORY:  She began drinking beer at age 76.  She is  drinking 12 24-ounce beers daily for the past three weeks at least.  She  also smokes a half pack of cigarettes per day.   PRIMARY CARE PHYSICIAN:  She denies.   MEDICAL PROBLEMS:  When admitted October 23, 2006, she was noted to have  alcoholic gastritis and hepatitis, hyponatremia and hypothyroidism.  She  also gives a history for a recent staph infection in her right arm,  pancreatitis, gall bladder removal, hysterectomy and carpal tunnel  repair.   MEDICATIONS:  She was discharged in January on Synthroid 50 mcg, 1 p.o.  q.d. and Prilosec over-the-counter 10 mg, 1 p.o. q.d.   ALLERGIES:  No known drug allergies.   POSITIVE PHYSICAL FINDINGS:  She was medically cleared in the ED.  Tonight, significantly, she does not have tremor.  She does not appear  to be having withdrawal.  She claims to have total body pain.  Earlier,  she was complaining of kidney pain.  As already mentioned, she does in  fact have hematuria.  She was given some Pyridium to help with the  bladder spasm and a repeat UA is ordered.  Unfortunately, that is not  yet available.  Her TSH was checked and that is normal at 2.768 although  she denies having taken her thyroid medicine for at least a month.   MENTAL STATUS EXAM:  Tonight, she is alert and oriented x3.  She is  appropriately groomed, dressed and nourished.  Although she has an odd  color, I would almost wonder if she does not have Addison's disease.  Her mood is depressed.  Her affect is depressed.  Her  thought processes  are clear but not completely logical.  Judgment and insight are poor.  Concentration and memory are intact.  She is not suicidal or homicidal.  She does not have auditory or visual hallucinations.   DIAGNOSES:  AXIS I:  Alcohol dependence.  AXIS II:  Deferred.  AXIS III:  Complaining of bladder pain.  She does in fact have blood in  her urine.  We need to rule out bladder cancer.  She does have a history  for alcoholic gastritis and hepatitis, also hypothyroidism and  hyponatremia in the past.  AXIS IV:  Severe (problems with primary support group, occupational,  housing, and economic issues, the patient states that she is currently  homeless).  AXIS V:  45.   PLAN:  To admit to support through detox and she may need to have a  urology evaluation regarding her hematuria.  Casemanagement will have to  help with post-discharge placement as she states she is homeless.      Mickie Leonarda Salon, P.A.-C.      Geoffery Lyons, M.D.  Electronically Signed    MD/MEDQ  D:  01/04/2007  T:  01/04/2007  Job:  [id number]
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
## 2011-03-06 NOTE — H&P (Signed)
[name], [name]:  [id number]   MEDICAL RECORD NO.:  [id number]          PATIENT TYPE:  EMS   LOCATION:  ED                           FACILITY:  [facility]   PHYSICIAN:  Hettie Holstein, D.O.    DATE OF BIRTH:  08-09-1956   DATE OF ADMISSION:  10/22/2006  DATE OF DISCHARGE:                              HISTORY & PHYSICAL   PRIMARY CARE PHYSICIAN:  Unassigned.   CHIEF COMPLAINT:  The patient seeking help for alcohol dependency and  nausea and vomiting and diarrhea in addition to abdominal pain.   HISTORY OF PRESENT ILLNESS:  [name] is a pleasant 55 year old  female who is homeless at present and known history of alcoholism who  had restarted drinking about 15 months ago. She drinks over a 12-pack on  a daily basis and she had been having inability to tolerate p.o. due to  severe mid epigastric pain. She was diagnosed initial [place] by  upper endoscopy according to the patient with gastritis.  She had been  instructed to take proton pump inhibitors in the past though has not  been able to comply with this.  In any event, she was seen in the  emergency room seeking assistance for detoxification.  She underwent  initially evaluation in the emergency department including a non-  contrast CT scan of the abdomen and evaluation of microscopic hematuria  and there were no stones noted and the patient does not report any flank  tenderness or flank pain.  She is status post total abdominal  hysterectomy and bilateral salpingo-oophorectomy as well as  cholecystectomy.  In any event she is being admitted for detoxification  and management of refractory abdominal pain.   PAST MEDICAL HISTORY:  She is noted to have gastritis and diverticulosis  per the patient's recollection. No known heart disease or kidney or lung  disease that she is aware of.   MEDICATIONS:  She takes no medications.   ALLERGIES:  No known drug allergies.   SOCIAL HISTORY:  She drinks  greater than a 12-pack of __________ per  day.  She denies any present crack cocaine or heroin use.  She is  currently homeless. She does have two children though she does not make  any contact.  A couple of years ago she was living in [place], [place].   FAMILY HISTORY:  Mother is living at age 19.  Had a CVA at age 4.  Father died at age 36. He had lung cancer and also was alcoholic.  She  does have coronary artery disease in her grandparents, age uncertain.   REVIEW OF SYSTEMS:  The patient has had some mid epigastric pain and she  describes no exertional chest discomfort nor any pleuritic discomfort.  Further review negative except for that described in the history of  presenting illness.  She has noticed some tingling in her left arm.   PHYSICAL EXAMINATION:  VITAL SIGNS:  Vital signs revealed a blood  pressure of 121/82, temperature 98, heart rate 78, respirations 18.  Oxygen saturation 95%.  GENERAL:  [name] is quite pleasant in providing  her history.  She  is oriented x3, in no acute distress.  No evidence of DTs at present.  She does smell of alcohol.  NECK:  Supple, nontender.  No palpable thyromegaly or masses.  CARDIOVASCULAR:  Normal S1 and S2.  LUNGS:  Clear bilaterally.  ABDOMEN:  Diffusely tender but most specifically in the left upper as  well as mid epigastric region.  There is no rebound.  EXTREMITIES:  Lower extremities reveal no edema.  NEUROLOGIC:  As noted above.  She appear euthymic and her affect is  flattened.  She moves all four extremities spontaneously.   LABORATORY DATA:  She was noted to have WBC 4.8, hemoglobin 14, platelet  count 260,000, MCV 96.  Sodium 134, potassium 3.5, BUN 7, creatinine  0.6, glucose 89.  Lipase 44.  Alcohol is at the level of __________ .  Urinalysis revealed 2150 wbc's per high-power field.   ASSESSMENT:  1. Alcoholic gastritis.  2. Alcoholism.  3. Homelessness.  4. Hematuria, microscopic.  5. Poor oral  intake.  6. Left hand paresthesias.   PLAN:  At this time we are going to admit [name] for further  observation in alignment with outpatient detox services and will  administer IV fluids with thiamine and folate as well as check a B12  level.  Will follow closely and place her on a DT protocol and get a  routine chest x-ray as well as administer screening for HIV and  hepatitis C and check her urine cytology.      Hettie Holstein, D.O.  Electronically Signed     ESS/MEDQ  D:  10/23/2006  T:  10/23/2006  Job:  [id number]

## 2011-03-06 NOTE — H&P (Signed)
[name], [name]              ACCOUNT NO.:  [id number]   MEDICAL RECORD NO.:  [id number]          PATIENT TYPE:  INP   LOCATION:  1829                         FACILITY:  [facility]   PHYSICIAN:  Toby L. Fugate, D.O.   DATE OF BIRTH:  04/15/1956   DATE OF ADMISSION:  01/30/2006  DATE OF DISCHARGE:                                HISTORY & PHYSICAL   PRIMARY CARE PHYSICIAN:  None.   HISTORY OF PRESENT ILLNESS:  [name] is a 55 year old Caucasian female  who has a known history of alcohol dependency.  She quit drinking several  years ago.  Back about 10 months ago, she lost her job, her car, and her  house.  After this, she began to drink again.  For the past 10 months, she  has drank at least a six pack a day.  On some days, she has drank two six  packs.  She has continued to do this throughout the month.  She feels like  now she cannot go on doing this.  Today she did have suicidal ideations.  Currently, she denies suicidal ideations and homicidal ideations.   DICTATION ENDS HERE.      Toby L. Fugate, D.O.     TLF/MEDQ  D:  01/31/2006  T:  01/31/2006  Job:  [id number]
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
## 2011-03-06 NOTE — Discharge Summary (Signed)
[name], [name]              ACCOUNT NO.:  [id number]   MEDICAL RECORD NO.:  [id number]          PATIENT TYPE:  IPS   LOCATION:  0508                          FACILITY:  [facility]   PHYSICIAN:  Anselm Jungling, MD  DATE OF BIRTH:  14-Apr-1956   DATE OF ADMISSION:  01/03/2007  DATE OF DISCHARGE:  01/07/2007                               DISCHARGE SUMMARY   IDENTIFYING DATA AND REASON FOR ADMISSION:  This was an inpatient  psychiatric admission for [name], a 55 year old single white female  admitted for alcohol dependence.  Her last drink had been on the day  prior to admission.  She was homeless.  Please refer to the admission  note for further details pertaining to the symptoms, circumstances and  history that led to her hospitalization.  She was given an initial Axis  I diagnosis of alcohol dependence.   MEDICAL AND LABORATORY:  The patient came to [place] with a history of  pancreatitis, presumably secondary to alcoholism, and she had been  hospitalized 3 weeks prior to this hospitalization for treatment of  pancreatitis.  She also had history of alcohol-related seizures, GERD,  and hypothyroidism.  She was medically and physically assessed by the  psychiatric nurse practitioner.   The patient had a good deal of gastritic symptoms, including nausea and  burning.  When amylase was checked it was found to be elevated.  The  patient appeared to be increasingly dehydrated due to her inability to  keep food or liquid down.  She was sent to the emergency room briefly,  where she was evaluated and given intravenous rehydration.  She was  returned to the inpatient psychiatric service and reported feeling much,  much better after this.  Apparently there were no signs or symptoms in  the emergency department of any acute pancreatic process.  There were no  further medical issues.   HOSPITAL COURSE:  The patient was admitted to the adult inpatient  psychiatric service.  She presented as a  well-nourished, well-developed  adult female who initially appeared quite ill, but otherwise was alert,  fully oriented, without any signs or symptoms of delirium or psychosis.  Her mood was moderately depressed with a normal range of affect,  appropriate to the situation.  She was absent suicidal ideation.  She  verbalized a desire for help.   She was placed on a Librium withdrawal protocol, given Protonix for  GERD, and restarted on Prozac 20 mg daily.  She was continued on her  usual Synthroid.   She felt ill for the first 48 hours as described above, but after  returning from the emergency room visit felt much better and was able to  participate in various therapeutic groups and activities, including  those geared towards 12-step recovery.   By the fourth hospital day the patient stated that she felt better but  still complained of sweats and jerky sleep with some nausea.   On the fifth hospital day the patient reported feeling much better, and  on the Librium taper she was not experiencing any significant tremor.  She appeared to be appropriate  for discharge and agreed to the following  aftercare plan.   AFTERCARE:  The patient was to follow up at the [facility] program where she  was to see Mr. Cameron Sprang on January 07, 2007, the day of discharge.  She was also given contact information for [employer], the  homeless coalition.   DISCHARGE MEDICATIONS:  1. Protonix 40 mg daily.  2. Prozac 20 mg daily.  3. Synthroid 50 mcg daily.   DISCHARGE DIAGNOSES:  AXIS I:  Alcohol dependence, early remission.  AXIS II:  History of pancreatitis, history of renal stones,  hypothyroidism, gastroesophageal reflux disease, alcoholic gastritis,  history of alcohol-induced seizures.  AXIS IV:  Stressors severe.  AXIS V:  Global assessment of functioning on discharge 60.      Anselm Jungling, MD  Electronically Signed     SPB/MEDQ  D:  01/10/2007  T:  01/10/2007  Job:   [id number]

## 2011-07-30 LAB — URINALYSIS, ROUTINE W REFLEX MICROSCOPIC
Nitrite: NEGATIVE
Specific Gravity, Urine: 1.007
Urobilinogen, UA: 0.2

## 2011-07-30 LAB — COMPREHENSIVE METABOLIC PANEL
Albumin: 4
BUN: 5 — ABNORMAL LOW
CO2: 22
Chloride: 101
Creatinine, Ser: 0.65
GFR calc non Af Amer: >60
Total Bilirubin: 1.1

## 2011-07-30 LAB — DIFFERENTIAL
Basophils Absolute: 0.1
Lymphocytes Relative: 26
Neutro Abs: 4

## 2011-07-30 LAB — CBC
HCT: 42.4
MCHC: 34.6
MCV: 97.3
Platelets: 217
WBC: 6

## 2011-07-30 LAB — TSH: TSH: 2.282

## 2011-07-30 LAB — LIPASE, BLOOD: Lipase: 25

## 2016-05-03 ENCOUNTER — Encounter (HOSPITAL_COMMUNITY): Payer: Self-pay | Admitting: Oncology

## 2016-05-03 ENCOUNTER — Emergency Department (HOSPITAL_COMMUNITY)
Admission: EM | Admit: 2016-05-03 | Discharge: 2016-05-03 | Disposition: A | Discharge status: Home / Self Care | Payer: Medicaid Other | Attending: Physician Assistant | Admitting: Physician Assistant

## 2016-05-03 DIAGNOSIS — M549 Dorsalgia, unspecified: Secondary | ICD-10-CM | POA: Diagnosis not present

## 2016-05-03 DIAGNOSIS — M542 Cervicalgia: Secondary | ICD-10-CM | POA: Insufficient documentation

## 2016-05-03 DIAGNOSIS — R197 Diarrhea, unspecified: Secondary | ICD-10-CM | POA: Diagnosis not present

## 2016-05-03 DIAGNOSIS — F1123 Opioid dependence with withdrawal: Secondary | ICD-10-CM | POA: Insufficient documentation

## 2016-05-03 DIAGNOSIS — Z79899 Other long term (current) drug therapy: Secondary | ICD-10-CM | POA: Insufficient documentation

## 2016-05-03 DIAGNOSIS — R000 Tachycardia, unspecified: Secondary | ICD-10-CM | POA: Diagnosis not present

## 2016-05-03 DIAGNOSIS — F17200 Nicotine dependence, unspecified, uncomplicated: Secondary | ICD-10-CM | POA: Insufficient documentation

## 2016-05-03 DIAGNOSIS — R112 Nausea with vomiting, unspecified: Secondary | ICD-10-CM | POA: Diagnosis present

## 2016-05-03 DIAGNOSIS — F1193 Opioid use, unspecified with withdrawal: Secondary | ICD-10-CM

## 2016-05-03 HISTORY — DX: Other chronic pain: G89.29

## 2016-05-03 HISTORY — DX: Other cervical disc displacement, unspecified cervical region: M50.20

## 2016-05-03 MED ORDER — LOPERAMIDE HCL 2 MG PO CAPS: 2.0000 mg | ORAL_CAPSULE | Freq: Once | ORAL | Status: DC

## 2016-05-03 MED ORDER — ONDANSETRON 8 MG PO TBDP
8.0000 mg | ORAL_TABLET | Freq: Once | ORAL | Status: AC
  Administered 2016-05-03: 8 mg via ORAL
  Filled 2016-05-03: qty 1

## 2016-05-03 MED ORDER — LOPERAMIDE HCL 2 MG PO CAPS
2.0000 mg | ORAL_CAPSULE | Freq: Once | ORAL | Status: AC
  Administered 2016-05-03: 2 mg via ORAL
  Filled 2016-05-03: qty 1

## 2016-05-03 MED ORDER — CYCLOBENZAPRINE HCL 10 MG PO TABS
10.0000 mg | ORAL_TABLET | Freq: Once | ORAL | Status: AC
  Administered 2016-05-03: 10 mg via ORAL
  Filled 2016-05-03: qty 1

## 2016-05-03 MED ORDER — ONDANSETRON 8 MG PO TBDP: 8.0000 mg | ORAL_TABLET | Freq: Once | ORAL | Status: DC

## 2016-05-03 MED ORDER — CYCLOBENZAPRINE HCL 10 MG PO TABS: 10.0000 mg | ORAL_TABLET | Freq: Once | ORAL | Status: DC

## 2016-05-03 NOTE — ED Provider Notes (Signed)
CSN: [id number]     Arrival date & time 05/03/16  2039 History   First MD Initiated Contact with Patient 05/03/16 2130     Chief Complaint  Patient presents with  . Nausea  . Emesis     (Consider location/radiation/quality/duration/timing/severity/associated sxs/prior Treatment) HPI Comments: Patient presents to the emergency department with chief complaint of chronic pain. She states that she has run out of her pain medication. She states that she has been out for the past 6 days. States that she is having withdrawal symptoms including nausea, muscle cramps, and diarrhea. She states that she is trying to find a new pain specialist. She states that she is currently on disability, but used to be a hairdresser and reports this is also her chronic neck pain. There are no other associated symptoms.  The history is provided by the patient. No language interpreter was used.    Past Medical History  Diagnosis Date  . Chronic pain   . Herniated cervical disc    Past Surgical History  Procedure Laterality Date  . Cervical fusion    . Carpel tunnel release Bilateral   . Knee surgery    . Abdominal hysterectomy    . Ankle surgey (right)     No family history on file. Social History  Substance Use Topics  . Smoking status: Current Every Day Smoker  . Smokeless tobacco: Never Used  . Alcohol Use: Yes   OB History    No data available     Review of Systems  Gastrointestinal: Positive for nausea and diarrhea.  Musculoskeletal: Positive for myalgias.  All other systems reviewed and are negative.     Allergies  Ketorolac and Tramadol  Home Medications   Prior to Admission medications   Medication Sig Start Date End Date Taking? Authorizing Provider  FLUoxetine (PROZAC) 40 MG capsule Take 40 mg by mouth daily.   Yes Historical Provider, MD  gabapentin (NEURONTIN) 800 MG tablet Take 800 mg by mouth 4 (four) times daily.   Yes Historical Provider, MD  ondansetron (ZOFRAN) 8 MG  tablet Take 8 mg by mouth daily.   Yes Historical Provider, MD  oxyCODONE-acetaminophen (PERCOCET) 7.5-325 MG tablet Take 1 tablet by mouth every 6 (six) hours as needed for severe pain.   Yes Historical Provider, MD   BP 144/91 mmHg  Pulse 108  Temp(Src) 98.1 F (36.7 C) (Axillary)  Resp 20  SpO2 97% Physical Exam  Constitutional: She is oriented to person, place, and time. She appears well-developed and well-nourished.  HENT:  Head: Normocephalic and atraumatic.  Eyes: Conjunctivae and EOM are normal. Pupils are equal, round, and reactive to light.  Neck: Normal range of motion. Neck supple.  Cardiovascular: Regular rhythm.  Exam reveals no gallop and no friction rub.   No murmur heard. Mild tachycardia  Pulmonary/Chest: Effort normal and breath sounds normal. No respiratory distress. She has no wheezes. She has no rales. She exhibits no tenderness.  Abdominal: Soft. Bowel sounds are normal. She exhibits no distension and no mass. There is no tenderness. There is no rebound and no guarding.  No focal abdominal tenderness, no RLQ tenderness or pain at McBurney's point, no RUQ tenderness or Murphy's sign, no left-sided abdominal tenderness, no fluid wave, or signs of peritonitis   Musculoskeletal: Normal range of motion. She exhibits no edema or tenderness.  Neurological: She is alert and oriented to person, place, and time.  Skin: Skin is warm and dry.  Psychiatric: She has a normal mood  and affect. Her behavior is normal. Judgment and thought content normal.  Nursing note and vitals reviewed.   ED Course  Procedures (including critical care time)   MDM   Final diagnoses:  Opiate withdrawal [facility](HCC)    Patient presents with withdrawal symptoms.  Ran out of her pain meds 6 days ago.  Doubt acute emergent process.  Will treat with flexeril, zofran, and imodium. Chronic pain management follow-up.    Roxy Horsemanobert Tashon Capp, PA-C 05/03/16 2146  Courteney Randall AnLyn Mackuen, MD 05/03/16  2238

## 2016-05-03 NOTE — Discharge Instructions (Signed)
Opioid Withdrawal Opioids are a group of narcotic drugs. They include the street drug heroin. They also include pain medicines, such as morphine, hydrocodone, oxycodone, and fentanyl. Opioid withdrawal is a group of characteristic physical and mental signs and symptoms. It typically occurs if you have been using opioids daily for several weeks or longer and stop using or rapidly decrease use. Opioid withdrawal can also occur if you have used opioids daily for a long time and are given a medicine to block the effect.  SIGNS AND SYMPTOMS Opioid withdrawal includes three or more of the following symptoms:   Depressed, anxious, or irritable mood.  Nausea or vomiting.  Muscle aches or spasms.   Watery eyes.   Runny nose.  Dilated pupils, sweating, or hairs standing on end.  Diarrhea or intestinal cramping.  Yawning.   Fever.  Increased blood pressure.  Fast pulse.  Restlessness or trouble sleeping. These signs and symptoms occur within several hours of stopping or reducing short-acting opioids, such as heroin. They can occur within 3 days of stopping or reducing long-acting opioids, such as methadone. Withdrawal begins within minutes of receiving a drug that blocks the effects of opioids, such as naltrexone or naloxone. DIAGNOSIS  Opioid use disorder is diagnosed by your health care provider. You will be asked about your symptoms, drug and alcohol use, medical history, and use of medicines. A physical exam may be done. Lab tests may be ordered. Your health care provider may have you see a mental health professional.  TREATMENT  The treatment for opioid withdrawal is usually provided by medical doctors with special training in substance use disorders (addiction specialists). The following medicines may be included in treatment:  Opioids given in place of the abused opioid. They turn on opioid receptors in the brain and lessen or prevent withdrawal symptoms. They are gradually  decreased (opioid substitution and taper).  Non-opioids that can lessen certain opioid withdrawal symptoms. They may be used alone or with opioid substitution and taper. Successful long-term recovery usually requires medicine, counseling, and group support. HOME CARE INSTRUCTIONS   Take medicines only as directed by your health care provider.  Check with your health care provider before starting new medicines.  Keep all follow-up visits as directed by your health care provider. SEEK MEDICAL CARE IF:  You are not able to take your medicines as directed.  Your symptoms get worse.  You relapse. SEEK IMMEDIATE MEDICAL CARE IF:  You have serious thoughts about hurting yourself or others.  You have a seizure.  You lose consciousness.   This information is not intended to replace advice given to you by your health care provider. Make sure you discuss any questions you have with your health care provider.   Document Released: 10/08/2003 Document Revised: 10/26/2014 Document Reviewed: 10/18/2013 Elsevier Interactive Patient Education 2016 [employer]. [employer] Guide Chronic Pain [employer] is a great source of information about community services available.  Access by dialing 2-1-1 from anywhere in [place], or by website -  PooledIncome.pl.   Other Local Resources (Updated 10/2015)   Chronic Pain   Services     Phone Number and Address  [facility]  Pain management is offered by board-certified physician specialists, physical therapists, occupational therapists, and other professionals  Support groups [phone] [address], [address], [place], [place] [address]  [facility]  [facility]  Various pain management strategies are offered,  including medication, acupuncture, and manual therapy  Must be referred by primary care doctor  [phone] [address], [address] [place], [place]   [facility] [place]  Pain management clinic  Must be referred by primary care doctor [phone] [address], [address] [place], [place]  [facility]  Pain management is offered by board-certified physician specialists [phone] [address] [place], [place] [id number]

## 2016-05-03 NOTE — ED Notes (Signed)
Per EMS patient c/o nausea and vomiting due to medication w/d.  Patient has severe neck and back pain that is prescribed percocet.  Last refill on medication on 04/15/16 and patient prescription bottle is empty x6 days per EMS.  VS en route BP 130/84 PR 110, O2 97, CBG 121.

## 2016-05-03 NOTE — ED Notes (Signed)
Pt admits to taking 6 of her pain pills per day however d/t date filled and date pt ran out of medication it appears pt was taking 10 pills per day.

## 2016-06-11 ENCOUNTER — Emergency Department (HOSPITAL_COMMUNITY)
Admission: EM | Admit: 2016-06-11 | Discharge: 2016-06-11 | Disposition: A | Discharge status: Home / Self Care | Payer: Medicaid Other | Attending: Emergency Medicine | Admitting: Emergency Medicine

## 2016-06-11 ENCOUNTER — Emergency Department (HOSPITAL_COMMUNITY): Payer: Medicaid Other

## 2016-06-11 ENCOUNTER — Encounter (HOSPITAL_COMMUNITY): Payer: Self-pay | Admitting: Emergency Medicine

## 2016-06-11 DIAGNOSIS — M545 Low back pain, unspecified: Secondary | ICD-10-CM

## 2016-06-11 DIAGNOSIS — G8929 Other chronic pain: Secondary | ICD-10-CM | POA: Insufficient documentation

## 2016-06-11 DIAGNOSIS — M542 Cervicalgia: Secondary | ICD-10-CM | POA: Insufficient documentation

## 2016-06-11 DIAGNOSIS — R51 Headache: Secondary | ICD-10-CM | POA: Insufficient documentation

## 2016-06-11 DIAGNOSIS — F1010 Alcohol abuse, uncomplicated: Secondary | ICD-10-CM | POA: Diagnosis not present

## 2016-06-11 DIAGNOSIS — M549 Dorsalgia, unspecified: Secondary | ICD-10-CM

## 2016-06-11 DIAGNOSIS — F17210 Nicotine dependence, cigarettes, uncomplicated: Secondary | ICD-10-CM | POA: Insufficient documentation

## 2016-06-11 NOTE — ED Notes (Signed)
Patient reports that she fell down an embankment and now has neck and back pain. Patient states she last drank 6 hours ago and had one 24 ounce beer. Patient denies SI/HI, but states she wants detox.from alcohol. Patient states she she lost her house and is now homeless. MAE.

## 2016-06-11 NOTE — ED Triage Notes (Signed)
Per [facility]- Pt seen standing outside the [employer] store states she fell about 2 hrs ago and has neck and back pain hx of spinal fusion. Requesting detox No obvious injury noted and requests pain meds from EMT.   Vitals  118/88 78 hr

## 2016-06-11 NOTE — ED Provider Notes (Signed)
[facility]-EMERGENCY DEPT Provider Note   CSN: [id number] Arrival date & time: 06/11/16  1259     History   Chief Complaint Chief Complaint  Patient presents with  . Back Pain  . Neck Pain  . Alcohol Problem    HPI [name] is a 60 y.o. female.  HPI 60 yo F with PMHx of chronic alcoholism and chronic pain who presents with acute on chronic neck pain after fall. Pt states she has been homeless for last several weeks due to losing her home. She has run out of her pain meds and has been drinking alcohol for her pain. She had several shots this morning then fell while outside of the [facility] store. She states that she tripped and fell backward onto her head and neck. She currently has an aching, throbbing pain in her right>left paraspinal neck. No numbness or weakness. Pain made worse with movement or palpation. No alleviating factors. She is requesting narcotics. No SI, HI, AVH.  Past Medical History:  Diagnosis Date  . Chronic pain   . Herniated cervical disc     There are no active problems to display for this patient.   Past Surgical History:  Procedure Laterality Date  . ABDOMINAL HYSTERECTOMY    . ankle surgey (right)    . carpel tunnel release Bilateral   . CERVICAL FUSION    . KNEE SURGERY      OB History    No data available       Home Medications    Prior to Admission medications   Medication Sig Start Date End Date Taking? Authorizing Provider  cyclobenzaprine (FLEXERIL) 10 MG tablet Take 1 tablet (10 mg total) by mouth once. Patient not taking: Reported on 06/11/2016 05/03/16   Roxy Horseman, PA-C  loperamide (IMODIUM) 2 MG capsule Take 1 capsule (2 mg total) by mouth once. Patient not taking: Reported on 06/11/2016 05/03/16   Roxy Horseman, PA-C  ondansetron (ZOFRAN-ODT) 8 MG disintegrating tablet Take 1 tablet (8 mg total) by mouth once. Patient not taking: Reported on 06/11/2016 05/03/16   Roxy Horseman, PA-C    Family History No family history on  file.  Social History Social History  Substance Use Topics  . Smoking status: Current Every Day Smoker    Packs/day: 1.00    Types: Cigarettes  . Smokeless tobacco: Never Used  . Alcohol use Yes     Allergies   Ketorolac and Tramadol   Review of Systems Review of Systems  Constitutional: Negative for chills and fever.  HENT: Negative for congestion, rhinorrhea and sore throat.   Eyes: Negative for visual disturbance.  Respiratory: Negative for cough, shortness of breath and wheezing.   Cardiovascular: Negative for chest pain and leg swelling.  Gastrointestinal: Negative for abdominal pain, diarrhea, nausea and vomiting.  Genitourinary: Negative for dysuria, flank pain, vaginal bleeding and vaginal discharge.  Musculoskeletal: Positive for back pain, neck pain and neck stiffness.  Skin: Negative for rash.  Allergic/Immunologic: Negative for immunocompromised state.  Neurological: Positive for headaches. Negative for syncope.  Hematological: Does not bruise/bleed easily.  All other systems reviewed and are negative.    Physical Exam Updated Vital Signs BP 113/77 (BP Location: Left Arm)   Pulse 71   Temp 98.1 F (36.7 C) (Oral)   Resp 18   SpO2 99%   Physical Exam  Constitutional: She is oriented to person, place, and time. She appears well-developed and well-nourished. No distress.  HENT:  Head: Normocephalic and atraumatic.  Eyes: Conjunctivae  are normal.  Neck: Neck supple. Spinous process tenderness (mild) and muscular tenderness (right>left) present. Normal range of motion present.  Cardiovascular: Normal rate, regular rhythm and normal heart sounds.  Exam reveals no friction rub.   No murmur heard. Pulmonary/Chest: Effort normal and breath sounds normal. No respiratory distress. She has no wheezes. She has no rales.  Abdominal: She exhibits no distension.  Musculoskeletal: She exhibits no edema.       Lumbar back: She exhibits tenderness (paraspinal  bilaterally). She exhibits no bony tenderness, no swelling and no deformity.  Neurological: She is alert and oriented to person, place, and time. She has normal strength. No cranial nerve deficit or sensory deficit. She exhibits normal muscle tone. GCS eye subscore is 4. GCS verbal subscore is 5. GCS motor subscore is 6.  Skin: Skin is warm. Capillary refill takes less than 2 seconds.  Psychiatric: She has a normal mood and affect.  Nursing note and vitals reviewed.    ED Treatments / Results  Labs (all labs ordered are listed, but only abnormal results are displayed) Labs Reviewed - No data to display  EKG  EKG Interpretation None       Radiology Dg Lumbar Spine Complete  Result Date: 06/11/2016 CLINICAL DATA:  Patient reports that she fell down an embankment today. Low back pain since then. EXAM: LUMBAR SPINE - COMPLETE 4+ VIEW COMPARISON:  CT, 04/03/2010 FINDINGS: No fracture.  No spondylolisthesis. Mild loss of disc height at L2-L3 and L3-L4. Remaining disc spaces are relatively well preserved. There are small endplate osteophytes at L2-L3 and L3-L4. Mild facet degenerative change is noted at L4-L5 and L5-S1. Soft tissues are unremarkable. IMPRESSION: 1. No fracture, spondylolisthesis or acute finding. Electronically Signed   By: Amie Portlandavid  Ormond M.D.   On: 06/11/2016 15:33   Ct Head Wo Contrast  Result Date: 06/11/2016 CLINICAL DATA:  Patient reports that she fell down an embankment and now has neck and back pain, pt. C/o headaches and neck pain, no c-collar in place, Hysterectomy EXAM: CT HEAD WITHOUT CONTRAST CT CERVICAL SPINE WITHOUT CONTRAST TECHNIQUE: Multidetector CT imaging of the head and cervical spine was performed following the standard protocol without intravenous contrast. Multiplanar CT image reconstructions of the cervical spine were also generated. COMPARISON:  05/01/2005 FINDINGS: CT HEAD FINDINGS The ventricles are normal in configuration. There is ventricular and  sulcal enlargement reflecting mild atrophy. No hydrocephalus. There are no parenchymal masses or mass effect. There is no evidence of an infarct. There are no extra-axial masses or abnormal fluid collections. There is no intracranial hemorrhage. No skull fracture. The visualized sinuses and mastoid air cells are clear. Globes and orbits are unremarkable. CT CERVICAL SPINE FINDINGS No acute fracture.  No spondylolisthesis. An anterior cervical spine fusion has been performed from C4 through C7. The fixation screws and associated anterior fusion plate appear well seated without evidence of loosening. Radiolucent disc spacers shows some subsidence at diffuse levels with at least moderate loss of the disc space height. There is minor loss of disc height at C3-C4 and mild loss disc height at C7-T1. Uncovertebral spurring leads to neural foraminal narrowing, greatest on the left at C5-C6 and C6-C7, moderate, and on the right at C4-C5, mild. Soft tissues are unremarkable.  Lung apices are clear. IMPRESSION: HEAD CT:  No acute intracranial abnormalities.  No skull fracture. CERVICAL CT:  No fracture or acute finding. Electronically Signed   By: Amie Portlandavid  Ormond M.D.   On: 06/11/2016 15:30   Ct Cervical  Spine Wo Contrast  Result Date: 06/11/2016 CLINICAL DATA:  Patient reports that she fell down an embankment and now has neck and back pain, pt. C/o headaches and neck pain, no c-collar in place, Hysterectomy EXAM: CT HEAD WITHOUT CONTRAST CT CERVICAL SPINE WITHOUT CONTRAST TECHNIQUE: Multidetector CT imaging of the head and cervical spine was performed following the standard protocol without intravenous contrast. Multiplanar CT image reconstructions of the cervical spine were also generated. COMPARISON:  05/01/2005 FINDINGS: CT HEAD FINDINGS The ventricles are normal in configuration. There is ventricular and sulcal enlargement reflecting mild atrophy. No hydrocephalus. There are no parenchymal masses or mass effect. There  is no evidence of an infarct. There are no extra-axial masses or abnormal fluid collections. There is no intracranial hemorrhage. No skull fracture. The visualized sinuses and mastoid air cells are clear. Globes and orbits are unremarkable. CT CERVICAL SPINE FINDINGS No acute fracture.  No spondylolisthesis. An anterior cervical spine fusion has been performed from C4 through C7. The fixation screws and associated anterior fusion plate appear well seated without evidence of loosening. Radiolucent disc spacers shows some subsidence at diffuse levels with at least moderate loss of the disc space height. There is minor loss of disc height at C3-C4 and mild loss disc height at C7-T1. Uncovertebral spurring leads to neural foraminal narrowing, greatest on the left at C5-C6 and C6-C7, moderate, and on the right at C4-C5, mild. Soft tissues are unremarkable.  Lung apices are clear. IMPRESSION: HEAD CT:  No acute intracranial abnormalities.  No skull fracture. CERVICAL CT:  No fracture or acute finding. Electronically Signed   By: Amie Portland M.D.   On: 06/11/2016 15:30    Procedures Procedures (including critical care time)  Medications Ordered in ED Medications - No data to display   Initial Impression / Assessment and Plan / ED Course  I have reviewed the triage vital signs and the nursing notes.  Pertinent labs & imaging results that were available during my care of the patient were reviewed by me and considered in my medical decision making (see chart for details).  Clinical Course     60 yo F with PMHx of chronic back pain, alcohol abuse who p/w fall while intoxicated. No LOC. Pt well-appearing with no signs of external trauma. VSS and WNL. CT Head, C-Spine show no acute fx. Plain films of L spine unremarkable as well. Lab work shows +ETOH but is o/w unremarkable. No numbness, weakness, or red flag sx to suggest acute radiculopathy or cord compression. Will allow pt to sober.  Pt increasingly  sober and can now ambulate. She admits her complaints are chronic. Confirms no LOC. No SI, HI, or AVH. Will consult SW for assistance with obtaining meds, shelter.  Pt noted to elope while waiting for SW consult. She was amblatory with steady gate. No SI, HI, AVH or indications for IVC. She was HDS with unremarkable labs and imaging.  Final Clinical Impressions(s) / ED Diagnoses   Final diagnoses:  Bilateral low back pain without sciatica  Alcohol abuse  Chronic back pain  Neck pain    New Prescriptions Discharge Medication List as of 06/11/2016  6:29 PM       Shaune Pollack, MD 06/12/16 [phone]

## 2016-06-11 NOTE — ED Notes (Signed)
Writer came out of another patient's room and the patient was not in the hall bed. Bed rails up

## 2016-06-11 NOTE — ED Notes (Signed)
Charge nurse notified

## 2016-07-27 SURGERY — Surgical Case
Anesthesia: *Unknown

## 2018-05-15 ENCOUNTER — Emergency Department (HOSPITAL_COMMUNITY): Payer: Medicaid Other

## 2018-05-15 ENCOUNTER — Emergency Department (HOSPITAL_COMMUNITY)
Admission: EM | Admit: 2018-05-15 | Discharge: 2018-05-16 | Disposition: A | Discharge status: Home / Self Care | Payer: Medicaid Other | Attending: Emergency Medicine | Admitting: Emergency Medicine

## 2018-05-15 ENCOUNTER — Other Ambulatory Visit: Payer: Self-pay

## 2018-05-15 DIAGNOSIS — R4589 Other symptoms and signs involving emotional state: Secondary | ICD-10-CM | POA: Insufficient documentation

## 2018-05-15 DIAGNOSIS — F17210 Nicotine dependence, cigarettes, uncomplicated: Secondary | ICD-10-CM | POA: Insufficient documentation

## 2018-05-15 DIAGNOSIS — F10230 Alcohol dependence with withdrawal, uncomplicated: Secondary | ICD-10-CM | POA: Insufficient documentation

## 2018-05-15 DIAGNOSIS — Z79899 Other long term (current) drug therapy: Secondary | ICD-10-CM | POA: Insufficient documentation

## 2018-05-15 DIAGNOSIS — F10920 Alcohol use, unspecified with intoxication, uncomplicated: Secondary | ICD-10-CM

## 2018-05-15 DIAGNOSIS — F1020 Alcohol dependence, uncomplicated: Secondary | ICD-10-CM | POA: Diagnosis present

## 2018-05-15 LAB — CBC
HCT: 40.5 % (ref 36.0–46.0)
Hemoglobin: 13.8 g/dL (ref 12.0–15.0)
MCH: 33.8 pg (ref 26.0–34.0)
MCHC: 34.1 g/dL (ref 30.0–36.0)
MCV: 99.3 fL (ref 78.0–100.0)
PLATELETS: 287 10*3/uL (ref 150–400)
RBC: 4.08 MIL/uL (ref 3.87–5.11)
RDW: 13.6 % (ref 11.5–15.5)
WBC: 5.7 10*3/uL (ref 4.0–10.5)

## 2018-05-15 LAB — MAGNESIUM: MAGNESIUM: 1.9 mg/dL (ref 1.7–2.4)

## 2018-05-15 LAB — URINALYSIS, ROUTINE W REFLEX MICROSCOPIC
Bilirubin Urine: NEGATIVE
GLUCOSE, UA: NEGATIVE mg/dL
HGB URINE DIPSTICK: NEGATIVE
Ketones, ur: NEGATIVE mg/dL
Leukocytes, UA: NEGATIVE
Nitrite: POSITIVE — AB
PROTEIN: NEGATIVE mg/dL
Specific Gravity, Urine: 1.004 — ABNORMAL LOW (ref 1.005–1.030)
pH: 6 (ref 5.0–8.0)

## 2018-05-15 LAB — COMPREHENSIVE METABOLIC PANEL
ALBUMIN: 3.9 g/dL (ref 3.5–5.0)
ALK PHOS: 59 U/L (ref 38–126)
ALT: 77 U/L — AB (ref 0–44)
AST: 161 U/L — AB (ref 15–41)
Anion gap: 14 (ref 5–15)
BUN: 6 mg/dL — AB (ref 8–23)
CALCIUM: 8.8 mg/dL — AB (ref 8.9–10.3)
CO2: 20 mmol/L — AB (ref 22–32)
CREATININE: 0.46 mg/dL (ref 0.44–1.00)
Chloride: 105 mmol/L (ref 98–111)
GFR calc Af Amer: >60 mL/min (ref >60)
GFR calc non Af Amer: >60 mL/min (ref >60)
Glucose, Bld: 103 mg/dL — ABNORMAL HIGH (ref 70–99)
Potassium: 3.9 mmol/L (ref 3.5–5.1)
SODIUM: 139 mmol/L (ref 135–145)
Total Bilirubin: 0.5 mg/dL (ref 0.3–1.2)
Total Protein: 6.9 g/dL (ref 6.5–8.1)

## 2018-05-15 LAB — RAPID URINE DRUG SCREEN, HOSP PERFORMED
Amphetamines: NOT DETECTED
BARBITURATES: NOT DETECTED
Benzodiazepines: NOT DETECTED
Cocaine: NOT DETECTED
Opiates: NOT DETECTED
Tetrahydrocannabinol: NOT DETECTED

## 2018-05-15 LAB — SALICYLATE LEVEL: Salicylate Lvl: <7 mg/dL (ref 2.8–30.0)

## 2018-05-15 LAB — ACETAMINOPHEN LEVEL: Acetaminophen (Tylenol), Serum: <10 ug/mL — ABNORMAL LOW (ref 10–30)

## 2018-05-15 LAB — ETHANOL: Alcohol, Ethyl (B): 435 mg/dL (ref <10)

## 2018-05-15 LAB — LIPASE, BLOOD: Lipase: 42 U/L (ref 11–51)

## 2018-05-15 MED ORDER — CEPHALEXIN 500 MG PO CAPS: 500.0000 mg | ORAL_CAPSULE | Freq: Four times a day (QID) | ORAL | 0 refills | Status: DC

## 2018-05-15 MED ORDER — CEPHALEXIN 500 MG PO CAPS
500.0000 mg | ORAL_CAPSULE | Freq: Once | ORAL | Status: AC
  Administered 2018-05-15: 500 mg via ORAL
  Filled 2018-05-15: qty 1

## 2018-05-15 MED ORDER — THIAMINE HCL 100 MG/ML IJ SOLN
Freq: Once | INTRAVENOUS | Status: AC
  Administered 2018-05-15: 20:00:00 via INTRAVENOUS
  Filled 2018-05-15: qty 1000

## 2018-05-15 NOTE — ED Notes (Signed)
Patient's husband called. Asked permission from patient to talk to him. Husband Oswald Hillockony Wilhide [phone] Rm 215 ( hotel ) Dennison BullaKenneth Dewalt [phone] ( close friend's cell phone )

## 2018-05-15 NOTE — ED Provider Notes (Addendum)
[facility]-EMERGENCY DEPT Provider Note   CSN: [id number] Arrival date & time: 05/15/18  1713     History   Chief Complaint Chief Complaint  Patient presents with  . Alcohol Intoxication    HPI [name] is a 62 y.o. female with history of chronic pain, depression, alcohol abuse is here for evaluation of "severe depression".  Patient states that in the last year she has been very depressed.  Notes she has had significant family stressors, death of her brother and mother and currently homeless.  She has been self-medicating with alcohol.  She cannot tell me how much she drinks daily but admits to drinking earlier today.  She has been thinking about not wanting to be alive but denies active and current suicidal thoughts or plan, homicidal ideation, auditory or visual hallucinations.  Denies illicit drug use.  Reports a chronic, wet sounding cough.  She denies any recent falls.  She has no fevers, headache, pain in her chest, shortness of breath, abdominal pain, burning with urination.  Per triage, patient was picked up from [facility] [employer] after she vomited at the bar.  There was initial concern for possible seizure prior to arrival.  Patient states that she has a long history of seizure and used to be on medications more than 3 years ago but not currently.  She cannot tell me when the last time she had a seizure was or what medication she used to be on. Per EMS patient did not appear to be postictal on arrival to the scene.  Level 5 caveat applies given possible intoxication.   HPI  Past Medical History:  Diagnosis Date  . Chronic pain   . Herniated cervical disc     There are no active problems to display for this patient.   Past Surgical History:  Procedure Laterality Date  . ABDOMINAL HYSTERECTOMY    . ankle surgey (right)    . carpel tunnel release Bilateral   . CERVICAL FUSION    . KNEE SURGERY       OB History   None      Home Medications     Prior to Admission medications   Medication Sig Start Date End Date Taking? Authorizing Provider  cephALEXin (KEFLEX) 500 MG capsule Take 1 capsule (500 mg total) by mouth 4 (four) times daily for 5 days. 05/15/18 05/20/18  Liberty HandyGibbons, Ajdin Macke J, PA-C  cyclobenzaprine (FLEXERIL) 10 MG tablet Take 1 tablet (10 mg total) by mouth once. Patient not taking: Reported on 06/11/2016 05/03/16   Roxy HorsemanBrowning, Robert, PA-C  loperamide (IMODIUM) 2 MG capsule Take 1 capsule (2 mg total) by mouth once. Patient not taking: Reported on 06/11/2016 05/03/16   Roxy HorsemanBrowning, Robert, PA-C  ondansetron (ZOFRAN-ODT) 8 MG disintegrating tablet Take 1 tablet (8 mg total) by mouth once. Patient not taking: Reported on 06/11/2016 05/03/16   Roxy HorsemanBrowning, Robert, PA-C    Family History No family history on file.  Social History Social History   Tobacco Use  . Smoking status: Current Every Day Smoker    Packs/day: 1.00    Types: Cigarettes  . Smokeless tobacco: Never Used  Substance Use Topics  . Alcohol use: Yes  . Drug use: No     Allergies   Ketorolac and Tramadol   Review of Systems Review of Systems  Unable to perform ROS: Other (question ETOH on board)  Psychiatric/Behavioral: Positive for dysphoric mood.  All other systems reviewed and are negative.    Physical Exam Updated Vital  Signs BP 101/66 (BP Location: Left Arm)   Pulse 81   Temp 97.9 F (36.6 C) (Oral)   Resp 20   SpO2 93%   Physical Exam  Constitutional: She is oriented to person, place, and time. She appears well-developed and well-nourished.  Found asleep in no distress, easily arousable.  Looks disheveled.  HENT:  Head: Normocephalic and atraumatic.  Right Ear: External ear normal.  Left Ear: External ear normal.  Nose: Nose normal.  Moist mucous membranes.  No lateral tongue injury.  No intraoral bleeding.  Eyes: Conjunctivae and EOM are normal.  Neck: Normal range of motion. Neck supple.  No midline C-spine tenderness.  Full active  range of motion of the neck without observed difficulty.  Cardiovascular: Normal rate, regular rhythm and normal heart sounds.  2+ radial and DP pulses bilaterally.  Pulmonary/Chest: Effort normal. She has rales.  Faint inspiratory crackles to lower lobes anteriorly, these clear with forceful cough.  Abdominal: Soft. There is no tenderness.  No suprapubic or CVA tenderness.  Soft.  Musculoskeletal: Normal range of motion. She exhibits no deformity.  Neurological: She is alert and oriented to person, place, and time.  Alert and oriented to self, place, time and event.  Speech is fluent without obvious dysarthria or dysphasia. Strength 5/5 with hand grip and ankle F/E.   Sensation to light touch intact in hands and feet. No truncal sway. No pronator drift. No leg drop.  Normal finger-to-nose and finger tapping.  CN I and VIII not tested. CN II-XII grossly intact bilaterally.   Skin: Skin is warm and dry. Capillary refill takes less than 2 seconds.  Psychiatric: She has a normal mood and affect. Her behavior is normal. Judgment and thought content normal.  Nursing note and vitals reviewed.    ED Treatments / Results  Labs (all labs ordered are listed, but only abnormal results are displayed) Labs Reviewed  COMPREHENSIVE METABOLIC PANEL - Abnormal; Notable for the following components:      Result Value   CO2 20 (*)    Glucose, Bld 103 (*)    BUN 6 (*)    Calcium 8.8 (*)    AST 161 (*)    ALT 77 (*)    All other components within normal limits  ETHANOL - Abnormal; Notable for the following components:   Alcohol, Ethyl (B) 435 (*)    All other components within normal limits  URINALYSIS, ROUTINE W REFLEX MICROSCOPIC - Abnormal; Notable for the following components:   Specific Gravity, Urine 1.004 (*)    Nitrite POSITIVE (*)    Bacteria, UA MANY (*)    All other components within normal limits  ACETAMINOPHEN LEVEL - Abnormal; Notable for the following components:   Acetaminophen  (Tylenol), Serum <10 (*)    All other components within normal limits  CBC  RAPID URINE DRUG SCREEN, HOSP PERFORMED  SALICYLATE LEVEL  LIPASE, BLOOD  MAGNESIUM    EKG None  Radiology Dg Chest 2 View  Result Date: 05/15/2018 CLINICAL DATA:  LOWER lobe crackles on auscultation. EXAM: CHEST - 2 VIEW COMPARISON:  10/21/2009 chest radiograph FINDINGS: The cardiomediastinal silhouette is unremarkable. There is no evidence of focal airspace disease, pulmonary edema, suspicious pulmonary nodule/mass, pleural effusion, or pneumothorax. No acute bony abnormalities are identified. LEFT rib fractures appear remote. Cervical spine fusion hardware noted. IMPRESSION: No active cardiopulmonary disease. Electronically Signed   By: Harmon Pier M.D.   On: 05/15/2018 19:11    Procedures Procedures (including critical care time)  Medications Ordered in ED Medications  sodium chloride 0.9 % 1,000 mL with thiamine 100 mg, folic acid 1 mg, multivitamins adult 10 mL infusion ( Intravenous New Bag/Given 05/15/18 2020)  cephALEXin (KEFLEX) capsule 500 mg (500 mg Oral Given 05/15/18 2020)     Initial Impression / Assessment and Plan / ED Course  I have reviewed the triage vital signs and the nursing notes.  Pertinent labs & imaging results that were available during my care of the patient were reviewed by me and considered in my medical decision making (see chart for details).  Clinical Course as of May 15 2142  Wynelle Link May 15, 2018  1927 AST(!): 161 [CG]  1927 ALT(!): 77 [CG]  1927 Nitrite(!): POSITIVE [CG]  1927 Bacteria, UA(!): MANY [CG]  1934 Alcohol, Ethyl (B)(!!): 435 [CG]    Clinical Course User Index [CG] Liberty Handy, PA-C   62 year old female brought to the ER by [employer] after she vomited at a bar today.  She admits to EtOH use tonight.  Additionally reports depression.  I suspect her vomiting is from acute EtOH intoxication.  She has no fever, abdominal tenderness.  No suprapubic or CVA  tenderness.  Low suspicion for acute abdominal pathology today.  However, given risk of we will obtain abdominal pain lab work including a lipase, urinalysis.  2027: EtOH 435, AST 161, ALT 77, positive nitrates and urine.  Pending lipase.  I reevaluated patient and she is in no distress, no tremors or signs of active withdrawal.  She is receiving Keflex for UTI, banana bag.  Repeat abdominal exam benign.  We will await clinical sober and patient will be considered medically cleared for TTS/psych evaluation.  2145: Re-evaluated pt who is found asleep, NAD.  Labs reviewed and unremarkable.  Continues to reports "severe depression".  No episodes of emesis. No signs of tremors, tachycardia, anxiety or signs of withdrawal.  Discharge per TTS recommendations. Patient will need keflex 500 mg BID x 5 days for UTI. Rx ordered to pharmacy. Final Clinical Impressions(s) / ED Diagnoses   Final diagnoses:  Alcoholic intoxication without complication (HCC)  Dysphoric mood    ED Discharge Orders        Ordered    cephALEXin (KEFLEX) 500 MG capsule  4 times daily     05/15/18 2143         Jerrell Mylar 05/15/18 2223    Gwyneth Sprout, MD 05/15/18 2250

## 2018-05-15 NOTE — ED Triage Notes (Signed)
Patient was picked up at [facility] after she vomited at the bar.  The bartender said she had 3-4 alcoholic drinks prior to the episode.  A companion, who EMS stated was a poor historian, said patient may have had a seizure.  Patient was yelling, "I need detox."  Patient did not appear to be post ictal according to EMS.  [id number].

## 2018-05-16 DIAGNOSIS — F10230 Alcohol dependence with withdrawal, uncomplicated: Secondary | ICD-10-CM | POA: Diagnosis present

## 2018-05-16 DIAGNOSIS — F1020 Alcohol dependence, uncomplicated: Secondary | ICD-10-CM | POA: Diagnosis present

## 2018-05-16 MED ORDER — LORAZEPAM 2 MG/ML IJ SOLN: 0.0000 mg | Freq: Two times a day (BID) | INTRAMUSCULAR | Status: DC

## 2018-05-16 MED ORDER — GABAPENTIN 300 MG PO CAPS: 300.0000 mg | ORAL_CAPSULE | Freq: Four times a day (QID) | ORAL | 0 refills | Status: DC

## 2018-05-16 MED ORDER — LORAZEPAM 1 MG PO TABS: 0.0000 mg | ORAL_TABLET | Freq: Two times a day (BID) | ORAL | Status: DC

## 2018-05-16 MED ORDER — LORAZEPAM 2 MG/ML IJ SOLN: 0.0000 mg | Freq: Four times a day (QID) | INTRAMUSCULAR | Status: DC

## 2018-05-16 MED ORDER — LORAZEPAM 1 MG PO TABS
2.0000 mg | ORAL_TABLET | Freq: Once | ORAL | Status: AC
  Administered 2018-05-16: 2 mg via ORAL
  Filled 2018-05-16: qty 2

## 2018-05-16 MED ORDER — CEPHALEXIN 500 MG PO CAPS: 500.0000 mg | ORAL_CAPSULE | Freq: Four times a day (QID) | ORAL | 0 refills | Status: AC

## 2018-05-16 MED ORDER — VITAMIN B-1 100 MG PO TABS
100.0000 mg | ORAL_TABLET | Freq: Every day | ORAL | Status: DC
  Administered 2018-05-16: 100 mg via ORAL
  Filled 2018-05-16: qty 1

## 2018-05-16 MED ORDER — CEPHALEXIN 500 MG PO CAPS: 500.0000 mg | ORAL_CAPSULE | Freq: Four times a day (QID) | ORAL | 0 refills | Status: DC

## 2018-05-16 MED ORDER — THIAMINE HCL 100 MG/ML IJ SOLN: 100.0000 mg | Freq: Every day | INTRAMUSCULAR | Status: DC

## 2018-05-16 MED ORDER — LORAZEPAM 1 MG PO TABS
0.0000 mg | ORAL_TABLET | Freq: Four times a day (QID) | ORAL | Status: DC
  Administered 2018-05-16: 2 mg via ORAL
  Filled 2018-05-16: qty 2

## 2018-05-16 MED ORDER — GABAPENTIN 300 MG PO CAPS: 300.0000 mg | ORAL_CAPSULE | Freq: Four times a day (QID) | ORAL | Status: DC

## 2018-05-16 NOTE — ED Notes (Signed)
Pt oriented to room and unit.  Pt is calm and cooperative.   Pt denies S/I, H/I, and AVH at this time.  Pt is in no acute distress.  Fluids were encouraged. 15 minute checks and video monitoring in place.

## 2018-05-16 NOTE — BH Assessment (Signed)
Reviewed with Donell SievertSpencer Simon, PA, patient meets inpatient criteria. Per AC, no beds. Will review in morning for bed availability. Dr. Read DriversMolpus and RN notified of disposition. TTS will secure placement.

## 2018-05-16 NOTE — ED Notes (Signed)
Bed: [facility]37 Expected date:  Expected time:  Means of arrival:  Comments: Hold for 28 

## 2018-05-16 NOTE — ED Notes (Signed)
Pt discharged safely after reviewing RX and discharge instructions.  All belongings were returned to patient.  Bus pass given.
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
## 2018-05-16 NOTE — [facility] Suicide Risk Assessment (Signed)
Suicide Risk Assessment  Discharge Assessment   [facility]   Principal Problem: Alcohol dependence with uncomplicated withdrawal [facility](HCC) Discharge Diagnoses:  Patient Active Problem List   Diagnosis Date Noted  . Alcohol dependence with uncomplicated withdrawal (HCC) [F10.230] 05/16/2018    Priority: High    Total Time spent with patient: 45 minutes  Musculoskeletal: Strength & Muscle Tone: within normal limits Gait & Station: normal Patient leans: N/A  Psychiatric Specialty Exam:   Blood pressure 140/81, pulse (!) 106, temperature 98.8 F (37.1 C), temperature source Oral, resp. rate (!) 24, SpO2 97 %.There is no height or weight on file to calculate BMI.  General Appearance: Casual  Eye Contact::  Good  Speech:  Normal Rate409  Volume:  Normal  Mood:  Euthymic  Affect:  Congruent  Thought Process:  Coherent and Descriptions of Associations: Intact  Orientation:  Full (Time, Place, and Person)  Thought Content:  WDL and Logical  Suicidal Thoughts:  No  Homicidal Thoughts:  No  Memory:  Immediate;   Good Recent;   Good Remote;   Good  Judgement:  Fair  Insight:  Fair  Psychomotor Activity:  Normal  Concentration:  Good  Recall:  Good  Fund of Knowledge:Good  Language: Good  Akathisia:  No  Handed:  Right  AIMS (if indicated):     Assets:  Leisure Time Physical Health Resilience Social Support  Sleep:     Cognition: WNL  ADL's:  Intact   Mental Status Per Nursing Assessment::   On Admission:   62 yo female who presented to the ED with alcohol intoxication.  Denies suicidal/homicidal ideations, hallucinations, and withdrawal symptoms.  She initially wanted to go to rehab/recovery but told the Peer support person she did not, wanted to stay here.  This provider explained to her that we do not do detox in the ED but would be happy to help her get to rehab/recovery but she refused the offer, requested discharge.  Given gabapentin Rx at  discharge and explained to her she needed to take it to prevent withdrawal symptoms.  Nodded agreement.  Demographic Factors:  Caucasian  Loss Factors: NA  Historical Factors: NA  Risk Reduction Factors:   Sense of responsibility to family, Positive social support and Positive therapeutic relationship  Continued Clinical Symptoms:  None  Cognitive Features That Contribute To Risk:  None    Suicide Risk:  Minimal: No identifiable suicidal ideation.  Patients presenting with no risk factors but with morbid ruminations; may be classified as minimal risk based on the severity of the depressive symptoms    Plan Of Care/Follow-up recommendations:  Activity:  as tolerated Diet:  heart healthy diet  Emily Jeune, NP 05/16/2018, 4:19 PM
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
## 2018-05-26 ENCOUNTER — Ambulatory Visit (HOSPITAL_COMMUNITY)
Admission: EM | Admit: 2018-05-26 | Discharge: 2018-05-26 | Disposition: A | Discharge status: Home / Self Care | Payer: Medicaid Other | Attending: Family Medicine | Admitting: Family Medicine

## 2018-05-26 ENCOUNTER — Encounter (HOSPITAL_COMMUNITY): Payer: Self-pay | Admitting: Emergency Medicine

## 2018-05-26 DIAGNOSIS — Z760 Encounter for issue of repeat prescription: Secondary | ICD-10-CM | POA: Diagnosis not present

## 2018-05-26 DIAGNOSIS — G629 Polyneuropathy, unspecified: Secondary | ICD-10-CM

## 2018-05-26 DIAGNOSIS — R350 Frequency of micturition: Secondary | ICD-10-CM

## 2018-05-26 DIAGNOSIS — M62838 Other muscle spasm: Secondary | ICD-10-CM | POA: Diagnosis not present

## 2018-05-26 DIAGNOSIS — G9009 Other idiopathic peripheral autonomic neuropathy: Secondary | ICD-10-CM

## 2018-05-26 LAB — POCT URINALYSIS DIP (DEVICE)
BILIRUBIN URINE: NEGATIVE
Glucose, UA: NEGATIVE mg/dL
HGB URINE DIPSTICK: NEGATIVE
Ketones, ur: NEGATIVE mg/dL
LEUKOCYTES UA: NEGATIVE
Nitrite: NEGATIVE
Protein, ur: NEGATIVE mg/dL
Specific Gravity, Urine: 1.01 (ref 1.005–1.030)
Urobilinogen, UA: 0.2 mg/dL (ref 0.0–1.0)
pH: 5.5 (ref 5.0–8.0)

## 2018-05-26 LAB — GLUCOSE, CAPILLARY: Glucose-Capillary: 79 mg/dL (ref 70–99)

## 2018-05-26 MED ORDER — FLUOXETINE HCL 20 MG PO TABS: 20.0000 mg | ORAL_TABLET | Freq: Every day | ORAL | 1 refills | Status: DC

## 2018-05-26 MED ORDER — CYCLOBENZAPRINE HCL 10 MG PO TABS: 10.0000 mg | ORAL_TABLET | Freq: Three times a day (TID) | ORAL | 0 refills | Status: DC

## 2018-05-26 MED ORDER — PANTOPRAZOLE SODIUM 20 MG PO TBEC: 20.0000 mg | DELAYED_RELEASE_TABLET | Freq: Every day | ORAL | 1 refills | Status: DC

## 2018-05-26 MED ORDER — GABAPENTIN 800 MG PO TABS: 800.0000 mg | ORAL_TABLET | Freq: Four times a day (QID) | ORAL | 0 refills | Status: DC

## 2018-05-26 NOTE — Discharge Instructions (Addendum)
Be aware, muscle relaxer medications may cause drowsiness. Please do not drive, operate heavy machinery or make important decisions while on this medication, it can cloud your judgement. ° °

## 2018-05-26 NOTE — ED Provider Notes (Signed)
[facility]   [id number] 05/26/18 Arrival Time: 1352  ASSESSMENT & PLAN:  1. Medication refill   2. Urinary frequency   3. Cervical paraspinal muscle spasm   4. Neuropathy     Meds ordered this encounter  Medications  . FLUoxetine (PROZAC) 20 MG tablet    Sig: Take 1 tablet (20 mg total) by mouth daily.    Dispense:  30 tablet    Refill:  1  . pantoprazole (PROTONIX) 20 MG tablet    Sig: Take 1 tablet (20 mg total) by mouth daily.    Dispense:  30 tablet    Refill:  1  . gabapentin (NEURONTIN) 800 MG tablet    Sig: Take 1 tablet (800 mg total) by mouth 4 (four) times daily.    Dispense:  120 tablet    Refill:  0  . cyclobenzaprine (FLEXERIL) 10 MG tablet    Sig: Take 1 tablet (10 mg total) by mouth 3 (three) times daily.    Dispense:  20 tablet    Refill:  0   Encouraged her to est care with a PCP. May f/u here as needed.  Reviewed expectations re: course of current medical issues. Questions answered. Outlined signs and symptoms indicating need for more acute intervention. Patient verbalized understanding. After Visit Summary given.   SUBJECTIVE: History from: patient. [name] is a 62 y.o. female who presents requesting medication refill. Gabapentin Prozac Protonix Has been on these medicines for many years.  Also reports intermittent urinary frequency without dysuria. On/off for the past several months. Afebrile. No abdominal/pelvic/flank pain reported. Normal PO intake. Occasional caffeine intake.  Also feels that upper back muscle spasms are flaring up recently. Usu takes a muscle relaxer with relief. Requests Rx be sent. No new injury.  Current medical problems include:  Past Medical History:  Diagnosis Date  . Chronic pain   . Herniated cervical disc   Neuropathy of LE bilaterally.  Current Outpatient Medications (Other):  .  cyclobenzaprine (FLEXERIL) 10 MG tablet, Take 1 tablet (10 mg total) by mouth 3 (three) times daily. Marland Kitchen.   FLUoxetine (PROZAC) 20 MG tablet, Take 1 tablet (20 mg total) by mouth daily. Marland Kitchen.  gabapentin (NEURONTIN) 800 MG tablet, Take 1 tablet (800 mg total) by mouth 4 (four) times daily. .  pantoprazole (PROTONIX) 20 MG tablet, Take 1 tablet (20 mg total) by mouth daily.   ROS: As per HPI.   OBJECTIVE:  Vitals:   05/26/18 1425  BP: 125/79  Pulse: 80  Resp: 16  Temp: 97.9 F (36.6 C)  TempSrc: Oral  SpO2: 100%    General appearance: alert; no distress Eyes: PERRLA; EOMI; conjunctiva normal Neck: supple; some muscle "soreness" to left neck musculature; FROM Lungs: clear to auscultation bilaterally Heart: regular rate and rhythm Abdomen: soft, non-tender Back: no CVA tenderness Extremities: no edema; symmetrical with no gross deformities; all extremities with FROM Skin: warm and dry Neurologic: normal gait; normal symmetric reflexes Psychological: alert and cooperative; normal mood and affect  Labs Reviewed  GLUCOSE, CAPILLARY  POCT URINALYSIS DIP (DEVICE)    Allergies  Allergen Reactions  . Ketorolac Hives  . Tramadol Hives    Past Medical History:  Diagnosis Date  . Chronic pain   . Herniated cervical disc    Social History   Socioeconomic History  . Marital status: Legally Separated    Spouse name: Not on file  . Number of children: Not on file  . Years of education: Not on file  .  Highest education level: Not on file  Occupational History  . Not on file  Social Needs  . Financial resource strain: Not on file  . Food insecurity:    Worry: Not on file    Inability: Not on file  . Transportation needs:    Medical: Not on file    Non-medical: Not on file  Tobacco Use  . Smoking status: Current Every Day Smoker    Packs/day: 1.00    Types: Cigarettes  . Smokeless tobacco: Never Used  Substance and Sexual Activity  . Alcohol use: Yes  . Drug use: No  . Sexual activity: Never  Lifestyle  . Physical activity:    Days per week: Not on file    Minutes per  session: Not on file  . Stress: Not on file  Relationships  . Social connections:    Talks on phone: Not on file    Gets together: Not on file    Attends religious service: Not on file    Active member of club or organization: Not on file    Attends meetings of clubs or organizations: Not on file    Relationship status: Not on file  . Intimate partner violence:    Fear of current or ex partner: Not on file    Emotionally abused: Not on file    Physically abused: Not on file    Forced sexual activity: Not on file  Other Topics Concern  . Not on file  Social History Narrative  . Not on file   No family history on file. Past Surgical History:  Procedure Laterality Date  . ABDOMINAL HYSTERECTOMY    . ankle surgey (right)    . carpel tunnel release Bilateral   . CERVICAL FUSION    . KNEE SURGERY       Mardella Layman, MD 05/26/18 [id number]

## 2018-05-26 NOTE — ED Triage Notes (Signed)
Pt states she needs a refill of gabapentin, she has an old bottle from 11/15/2016 for gabapentin 800mg  4 times a day. Pt also states she takes prozac 20mg  once a day. Pt thinks shes supposed to be on protonix.

## 2018-08-05 ENCOUNTER — Encounter (HOSPITAL_COMMUNITY): Payer: Self-pay | Admitting: Emergency Medicine

## 2018-08-05 ENCOUNTER — Ambulatory Visit (HOSPITAL_COMMUNITY)
Admission: EM | Admit: 2018-08-05 | Discharge: 2018-08-05 | Disposition: A | Discharge status: Home / Self Care | Payer: Medicaid Other | Attending: Family Medicine | Admitting: Family Medicine

## 2018-08-05 DIAGNOSIS — G8929 Other chronic pain: Secondary | ICD-10-CM | POA: Diagnosis not present

## 2018-08-05 DIAGNOSIS — Z760 Encounter for issue of repeat prescription: Secondary | ICD-10-CM

## 2018-08-05 DIAGNOSIS — M542 Cervicalgia: Secondary | ICD-10-CM

## 2018-08-05 MED ORDER — FLUOXETINE HCL 20 MG PO TABS: 20.0000 mg | ORAL_TABLET | Freq: Every day | ORAL | 1 refills | Status: DC

## 2018-08-05 MED ORDER — GABAPENTIN 800 MG PO TABS: 800.0000 mg | ORAL_TABLET | Freq: Four times a day (QID) | ORAL | 1 refills | Status: DC

## 2018-08-05 MED ORDER — PANTOPRAZOLE SODIUM 20 MG PO TBEC: 20.0000 mg | DELAYED_RELEASE_TABLET | Freq: Every day | ORAL | 1 refills | Status: DC

## 2018-08-05 MED ORDER — CYCLOBENZAPRINE HCL 10 MG PO TABS: 10.0000 mg | ORAL_TABLET | Freq: Three times a day (TID) | ORAL | 1 refills | Status: DC

## 2018-08-05 NOTE — Discharge Instructions (Addendum)
Medication refills were sent to the pharmacy I put in multiple contacts for primary care providers on your discharge instructions Please try to call around and establish with a primary care for further medication refills.

## 2018-08-05 NOTE — ED Provider Notes (Signed)
[facility]    CSN: [id number] Arrival date & time: 08/05/18  1356     History   Chief Complaint Chief Complaint  Patient presents with  . Medication Refill    HPI [name] is a 62 y.o. female.   Patient is a 62 year old female that presents today for medication refill on all her medicines.  She reports that she is currently in the process of trying a final primary care but it is hard with her Medicaid.  She reports that she called me a health wellness and there was like 200 people in front of her and she would be able to be seen for 4 to 5 months.  She is having chronic neck pain that is constant every day and she takes gabapentin for her pain.  She also takes her Flexeril at bedtime as needed.  She denies any associated or new symptoms to include numbness, tingling, weakness of extremities, saddle paresthesias, fever, body aches, fatigue, night sweats.  Denies any headaches, dizziness, chest pain or shortness of breath.  ROS per HPI        Past Medical History:  Diagnosis Date  . Chronic pain   . Herniated cervical disc     Patient Active Problem List   Diagnosis Date Noted  . Alcohol dependence with uncomplicated withdrawal (HCC) 05/16/2018    Past Surgical History:  Procedure Laterality Date  . ABDOMINAL HYSTERECTOMY    . ankle surgey (right)    . carpel tunnel release Bilateral   . CERVICAL FUSION    . KNEE SURGERY      OB History   None      Home Medications    Prior to Admission medications   Medication Sig Start Date End Date Taking? Authorizing Provider  cyclobenzaprine (FLEXERIL) 10 MG tablet Take 1 tablet (10 mg total) by mouth 3 (three) times daily. 08/05/18   Dahlia Byes A, NP  FLUoxetine (PROZAC) 20 MG tablet Take 1 tablet (20 mg total) by mouth daily. 08/05/18   Dahlia Byes A, NP  gabapentin (NEURONTIN) 800 MG tablet Take 1 tablet (800 mg total) by mouth 4 (four) times daily. 08/05/18   Lakeria Starkman, Gloris Manchester A, NP  pantoprazole  (PROTONIX) 20 MG tablet Take 1 tablet (20 mg total) by mouth daily. 08/05/18   Janace Aris, NP    Family History No family history on file.  Social History Social History   Tobacco Use  . Smoking status: Current Every Day Smoker    Packs/day: 1.00    Types: Cigarettes  . Smokeless tobacco: Never Used  Substance Use Topics  . Alcohol use: Yes  . Drug use: No     Allergies   Ketorolac and Tramadol   Review of Systems Review of Systems   Physical Exam Triage Vital Signs ED Triage Vitals  Enc Vitals Group     BP 08/05/18 1426 (!) 142/87     Pulse Rate 08/05/18 1426 80     Resp 08/05/18 1426 16     Temp 08/05/18 1426 98.5 F (36.9 C)     Temp Source 08/05/18 1426 Oral     SpO2 08/05/18 1426 100 %     Weight --      Height --      Head Circumference --      Peak Flow --      Pain Score 08/05/18 1434 0     Pain Loc --      Pain Edu? --  Excl. in GC? --    No data found.  Updated Vital Signs BP (!) 142/87 (BP Location: Left Arm)   Pulse 80   Temp 98.5 F (36.9 C) (Oral)   Resp 16   SpO2 100%   Visual Acuity Right Eye Distance:   Left Eye Distance:   Bilateral Distance:    Right Eye Near:   Left Eye Near:    Bilateral Near:     Physical Exam  Constitutional: She appears well-developed and well-nourished.  Very pleasant. Non toxic or ill appearing.   HENT:  Head: Normocephalic and atraumatic.  Eyes: Conjunctivae are normal.  Neck:  ROM Limited due to discomfort and previous cervical fusion.   Pulmonary/Chest: Effort normal.  Musculoskeletal: Normal range of motion.  Neurological: She is alert.  Skin: Skin is warm and dry.  Psychiatric: She has a normal mood and affect.  Nursing note and vitals reviewed.    UC Treatments / Results  Labs (all labs ordered are listed, but only abnormal results are displayed) Labs Reviewed - No data to display  EKG None  Radiology No results found.  Procedures Procedures (including critical care  time)  Medications Ordered in UC Medications - No data to display  Initial Impression / Assessment and Plan / UC Course  I have reviewed the triage vital signs and the nursing notes.  Pertinent labs & imaging results that were available during my care of the patient were reviewed by me and considered in my medical decision making (see chart for details).     Refilled pt medications and gave her 4 different primary care contacts to follow up.  Final Clinical Impressions(s) / UC Diagnoses   Final diagnoses:  Medication refill     Discharge Instructions     Medication refills were sent to the pharmacy I put in multiple contacts for primary care providers on your discharge instructions Please try to call around and establish with a primary care for further medication refills.     ED Prescriptions    Medication Sig Dispense Auth. Provider   cyclobenzaprine (FLEXERIL) 10 MG tablet Take 1 tablet (10 mg total) by mouth 3 (three) times daily. 20 tablet Anae Hams A, NP   FLUoxetine (PROZAC) 20 MG tablet Take 1 tablet (20 mg total) by mouth daily. 30 tablet Ladawn Boullion A, NP   gabapentin (NEURONTIN) 800 MG tablet Take 1 tablet (800 mg total) by mouth 4 (four) times daily. 120 tablet Josue Kass A, NP   pantoprazole (PROTONIX) 20 MG tablet Take 1 tablet (20 mg total) by mouth daily. 30 tablet Dahlia Byes A, NP     Controlled Substance Prescriptions [place] Controlled Substance Registry consulted? Not Applicable   Janace Aris, NP 08/05/18 1506

## 2018-08-05 NOTE — ED Triage Notes (Signed)
Pt needs all her medications refilled. Prozac, gabapentin, flexeril, and protonix.

## 2018-08-08 ENCOUNTER — Emergency Department (HOSPITAL_COMMUNITY)
Admission: EM | Admit: 2018-08-08 | Discharge: 2018-08-09 | Disposition: A | Discharge status: Home / Self Care | Payer: Medicaid Other | Attending: Emergency Medicine | Admitting: Emergency Medicine

## 2018-08-08 ENCOUNTER — Encounter (HOSPITAL_COMMUNITY): Payer: Self-pay | Admitting: Emergency Medicine

## 2018-08-08 ENCOUNTER — Emergency Department (HOSPITAL_COMMUNITY): Payer: Medicaid Other

## 2018-08-08 DIAGNOSIS — F17210 Nicotine dependence, cigarettes, uncomplicated: Secondary | ICD-10-CM | POA: Diagnosis not present

## 2018-08-08 DIAGNOSIS — K5792 Diverticulitis of intestine, part unspecified, without perforation or abscess without bleeding: Secondary | ICD-10-CM

## 2018-08-08 DIAGNOSIS — Z79899 Other long term (current) drug therapy: Secondary | ICD-10-CM | POA: Insufficient documentation

## 2018-08-08 DIAGNOSIS — R1030 Lower abdominal pain, unspecified: Secondary | ICD-10-CM | POA: Diagnosis present

## 2018-08-08 LAB — COMPREHENSIVE METABOLIC PANEL
ALK PHOS: 70 U/L (ref 38–126)
ALT: 19 U/L (ref 0–44)
AST: 23 U/L (ref 15–41)
Albumin: 4.2 g/dL (ref 3.5–5.0)
Anion gap: 9 (ref 5–15)
BUN: 7 mg/dL — ABNORMAL LOW (ref 8–23)
CALCIUM: 9.7 mg/dL (ref 8.9–10.3)
CO2: 24 mmol/L (ref 22–32)
CREATININE: 0.71 mg/dL (ref 0.44–1.00)
Chloride: 102 mmol/L (ref 98–111)
Glucose, Bld: 102 mg/dL — ABNORMAL HIGH (ref 70–99)
Potassium: 3.7 mmol/L (ref 3.5–5.1)
Sodium: 135 mmol/L (ref 135–145)
Total Bilirubin: 0.6 mg/dL (ref 0.3–1.2)
Total Protein: 7.8 g/dL (ref 6.5–8.1)

## 2018-08-08 LAB — CBC
HCT: 44.7 % (ref 36.0–46.0)
Hemoglobin: 14.1 g/dL (ref 12.0–15.0)
MCH: 32.9 pg (ref 26.0–34.0)
MCHC: 31.5 g/dL (ref 30.0–36.0)
MCV: 104.4 fL — ABNORMAL HIGH (ref 80.0–100.0)
PLATELETS: 330 10*3/uL (ref 150–400)
RBC: 4.28 MIL/uL (ref 3.87–5.11)
RDW: 13.2 % (ref 11.5–15.5)
WBC: 7.9 10*3/uL (ref 4.0–10.5)
nRBC: 0 % (ref 0.0–0.2)

## 2018-08-08 LAB — URINALYSIS, ROUTINE W REFLEX MICROSCOPIC
Bilirubin Urine: NEGATIVE
Glucose, UA: NEGATIVE mg/dL
Hgb urine dipstick: NEGATIVE
KETONES UR: NEGATIVE mg/dL
Leukocytes, UA: NEGATIVE
NITRITE: NEGATIVE
PROTEIN: NEGATIVE mg/dL
Specific Gravity, Urine: 1.009 (ref 1.005–1.030)
pH: 6 (ref 5.0–8.0)

## 2018-08-08 LAB — LIPASE, BLOOD: LIPASE: 33 U/L (ref 11–51)

## 2018-08-08 MED ORDER — IOHEXOL 300 MG/ML  SOLN
100.0000 mL | Freq: Once | INTRAMUSCULAR | Status: AC | PRN
  Administered 2018-08-08: 100 mL via INTRAVENOUS

## 2018-08-08 MED ORDER — METRONIDAZOLE 500 MG PO TABS
500.0000 mg | ORAL_TABLET | Freq: Once | ORAL | Status: AC
  Administered 2018-08-09: 500 mg via ORAL
  Filled 2018-08-08: qty 1

## 2018-08-08 MED ORDER — OXYCODONE-ACETAMINOPHEN 5-325 MG PO TABS
1.0000 | ORAL_TABLET | Freq: Once | ORAL | Status: AC
  Administered 2018-08-09: 1 via ORAL
  Filled 2018-08-08: qty 1

## 2018-08-08 MED ORDER — ONDANSETRON 4 MG PO TBDP
4.0000 mg | ORAL_TABLET | Freq: Once | ORAL | Status: AC
  Administered 2018-08-09: 4 mg via ORAL
  Filled 2018-08-08: qty 1

## 2018-08-08 MED ORDER — HYDROMORPHONE HCL 1 MG/ML IJ SOLN
1.0000 mg | Freq: Once | INTRAMUSCULAR | Status: AC
  Administered 2018-08-08: 1 mg via INTRAVENOUS
  Filled 2018-08-08: qty 1

## 2018-08-08 MED ORDER — ONDANSETRON HCL 4 MG/2ML IJ SOLN
4.0000 mg | Freq: Once | INTRAMUSCULAR | Status: AC
  Administered 2018-08-08: 4 mg via INTRAVENOUS
  Filled 2018-08-08: qty 2

## 2018-08-08 MED ORDER — CIPROFLOXACIN HCL 500 MG PO TABS
500.0000 mg | ORAL_TABLET | Freq: Once | ORAL | Status: AC
  Administered 2018-08-09: 500 mg via ORAL
  Filled 2018-08-08: qty 1

## 2018-08-08 NOTE — ED Triage Notes (Signed)
Pt presents with lower abd pain x 2 days with dysuria, pain with coughing, pain with walking; hx of complete hysterectomy; normal BM this morning

## 2018-08-08 NOTE — ED Provider Notes (Signed)
[facility] EMERGENCY DEPARTMENT Provider Note   CSN: [id number] Arrival date & time: 08/08/18  1656     History   Chief Complaint Chief Complaint  Patient presents with  . Abdominal Pain  . Dysuria    HPI [name] is a 62 y.o. female.  The history is provided by the patient and medical records.     62 year old female with history of chronic pain, herniated disks in the neck, presenting to the ED with abdominal pain.  Reports this began yesterday and has been precipitously worsening.  Pain localized to lower abdomen, more so on left side but somewhat in the suprapubic region.  She reports some nausea but denies vomiting.  States she has worsening pain with movement or attempting to have a bowel movement.  She has not had any melena or hematochezia.  No fever or chills.  States she also feels a lot of "pressure" when trying to urinate.  She has not noticed any blood in her urine or dysuria.  Prior abdominal surgeries include hysterectomy and cholecystectomy.  States she has never had a colonoscopy.  Past Medical History:  Diagnosis Date  . Chronic pain   . Herniated cervical disc     Patient Active Problem List   Diagnosis Date Noted  . Alcohol dependence with uncomplicated withdrawal (HCC) 05/16/2018    Past Surgical History:  Procedure Laterality Date  . ABDOMINAL HYSTERECTOMY    . ankle surgey (right)    . carpel tunnel release Bilateral   . CERVICAL FUSION    . CHOLECYSTECTOMY    . KNEE SURGERY       OB History   None      Home Medications    Prior to Admission medications   Medication Sig Start Date End Date Taking? Authorizing Provider  cyclobenzaprine (FLEXERIL) 10 MG tablet Take 1 tablet (10 mg total) by mouth 3 (three) times daily. 08/05/18  Yes Bast, Traci A, NP  FLUoxetine (PROZAC) 20 MG tablet Take 1 tablet (20 mg total) by mouth daily. Patient taking differently: Take 20 mg by mouth at bedtime.  08/05/18  Yes Bast, Traci  A, NP  gabapentin (NEURONTIN) 800 MG tablet Take 1 tablet (800 mg total) by mouth 4 (four) times daily. 08/05/18  Yes Bast, Traci A, NP  pantoprazole (PROTONIX) 20 MG tablet Take 1 tablet (20 mg total) by mouth daily. 08/05/18  Yes Janace Aris, NP    Family History History reviewed. No pertinent family history.  Social History Social History   Tobacco Use  . Smoking status: Current Every Day Smoker    Packs/day: 0.50    Types: Cigarettes  . Smokeless tobacco: Never Used  Substance Use Topics  . Alcohol use: Yes  . Drug use: No     Allergies   Ketorolac and Tramadol   Review of Systems Review of Systems  Gastrointestinal: Positive for abdominal pain and nausea.  All other systems reviewed and are negative.    Physical Exam Updated Vital Signs BP 128/84   Pulse 73   Temp 99 F (37.2 C) (Oral)   Resp 16   Ht 5\' 1"  (1.549 m)   Wt 59 kg   SpO2 100%   BMI 24.56 kg/m   Physical Exam  Constitutional: She is oriented to person, place, and time. She appears well-developed and well-nourished.  HENT:  Head: Normocephalic and atraumatic.  Mouth/Throat: Oropharynx is clear and moist.  Eyes: Pupils are equal, round, and reactive to light. Conjunctivae  and EOM are normal.  Neck: Normal range of motion.  Cardiovascular: Normal rate, regular rhythm and normal heart sounds.  Pulmonary/Chest: Effort normal and breath sounds normal.  Abdominal: Soft. Bowel sounds are normal. There is tenderness in the suprapubic area and left lower quadrant. There is guarding (Voluntary). There is no rigidity.  Musculoskeletal: Normal range of motion.  Neurological: She is alert and oriented to person, place, and time.  Skin: Skin is warm and dry.  Psychiatric: She has a normal mood and affect.  Nursing note and vitals reviewed.    ED Treatments / Results  Labs (all labs ordered are listed, but only abnormal results are displayed) Labs Reviewed  COMPREHENSIVE METABOLIC PANEL -  Abnormal; Notable for the following components:      Result Value   Glucose, Bld 102 (*)    BUN 7 (*)    All other components within normal limits  CBC - Abnormal; Notable for the following components:   MCV 104.4 (*)    All other components within normal limits  LIPASE, BLOOD  URINALYSIS, ROUTINE W REFLEX MICROSCOPIC    EKG None  Radiology Ct Abdomen Pelvis W Contrast  Result Date: 08/08/2018 CLINICAL DATA:  Lower abdominal pain EXAM: CT ABDOMEN AND PELVIS WITH CONTRAST TECHNIQUE: Multidetector CT imaging of the abdomen and pelvis was performed using the standard protocol following bolus administration of intravenous contrast. CONTRAST:  OMNIPAQUE IOHEXOL 300 MG/ML  SOLN COMPARISON:  04/03/2010 FINDINGS: Lower chest: Lung bases are clear. No effusions. Heart is normal size. Hepatobiliary: Prior cholecystectomy. Diffuse fatty infiltration of the liver. No focal hepatic abnormality. Pancreas: No focal abnormality or ductal dilatation. Spleen: No focal abnormality.  Normal size. Adrenals/Urinary Tract: No adrenal abnormality. No focal renal abnormality. No stones or hydronephrosis. Urinary bladder is unremarkable. Stomach/Bowel: Colonic diverticulosis in the descending colon and sigmoid colon. Inflammation noted around the upper sigmoid colon compatible with active diverticulitis. Stomach and small bowel decompressed, unremarkable. Appendix is normal. Vascular/Lymphatic: No evidence of aneurysm or adenopathy. Reproductive: Prior hysterectomy.  No adnexal masses. Other: Trace free fluid in the pelvis.  No free air. Musculoskeletal: No acute bony abnormality. IMPRESSION: Descending colonic and sigmoid diverticulosis. Inflammatory stranding around the upper sigmoid colon compatible with active diverticulitis. Trace free fluid in the pelvis. Fatty infiltration of the liver. Electronically Signed   By: Charlett Nose M.D.   On: 08/08/2018 20:32    Procedures Procedures (including critical care  time)  Medications Ordered in ED Medications  HYDROmorphone (DILAUDID) injection 1 mg (1 mg Intravenous Given 08/08/18 1955)  ondansetron (ZOFRAN) injection 4 mg (4 mg Intravenous Given 08/08/18 1955)  iohexol (OMNIPAQUE) 300 MG/ML solution 100 mL (100 mLs Intravenous Contrast Given 08/08/18 2014)  oxyCODONE-acetaminophen (PERCOCET/ROXICET) 5-325 MG per tablet 1 tablet (1 tablet Oral Given 08/09/18 0008)  ondansetron (ZOFRAN-ODT) disintegrating tablet 4 mg (4 mg Oral Given 08/09/18 0008)  ciprofloxacin (CIPRO) tablet 500 mg (500 mg Oral Given 08/09/18 0008)  metroNIDAZOLE (FLAGYL) tablet 500 mg (500 mg Oral Given 08/09/18 0008)     Initial Impression / Assessment and Plan / ED Course  I have reviewed the triage vital signs and the nursing notes.  Pertinent labs & imaging results that were available during my care of the patient were reviewed by me and considered in my medical decision making (see chart for details).  62 year old female here with lower abdominal pain.  Began yesterday and has been worsening.  She is afebrile and nontoxic but does appear somewhat uncomfortable.  She has  tenderness in left lower abdomen and somewhat in the suprapubic region as well.  Voluntary guarding but no rigidity.  Labs overall reassuring.  CT obtained from triage and reveals diverticulitis without abscess or perforation.  No other acute findings.  Discussed results with patient, she does report eating corn and peanuts last night which may have been precipitating factor.  We discussed diet modification, treatment with Cipro/Flagyl as well as pain and nausea control.  She tolerated oral medications here without issue.  She remains afebrile and nontoxic.  Given her reassuring lab work, stable vital signs, and no evidence of complications noted on CT I do feel she is appropriate for trial of outpatient therapy.  She will need close follow-up with her primary care doctor.  She has also never had a colonoscopy so  will give GI follow-up.  She will return here for any new or worsening symptoms.  Final Clinical Impressions(s) / ED Diagnoses   Final diagnoses:  Diverticulitis    ED Discharge Orders         Ordered    ciprofloxacin (CIPRO) 500 MG tablet  Every 12 hours     08/09/18 0012    metroNIDAZOLE (FLAGYL) 500 MG tablet  2 times daily     08/09/18 0012    oxyCODONE-acetaminophen (PERCOCET) 5-325 MG tablet  Every 4 hours PRN     08/09/18 0012    ondansetron (ZOFRAN ODT) 4 MG disintegrating tablet  Every 8 hours PRN     08/09/18 0012           Garlon Hatchet, PA-C 08/09/18 0231    Ward, Layla Maw, DO 08/09/18 [phone]

## 2018-08-08 NOTE — ED Provider Notes (Signed)
Patient placed in Quick Look pathway, seen and evaluated   Chief Complaint: pain with urination.  HPI:  [name] is a 62 y.o. female who presents with lower abd pain x 2 days. Patient reports that the pain is in the lower abdomen and radiates to the left side of the back. Pain increases with coughing, walking. Hx of  hysterectomy; patient reports pain with urination.  ROS: GI: abdominal pain  GU: pain with urination  Physical Exam:  BP 116/79   Pulse 73   Temp 99 F (37.2 C) (Oral)   Resp 18   Ht 5\' 1"  (1.549 m)   Wt 59 kg   SpO2 100%   BMI 24.56 kg/m    Gen: No distress  Neuro: Awake and Alert  Skin: Warm and dry  Abdomen: soft, tender with palpation of the lower abdomen worse on the left.      Initiation of care has begun. The patient has been counseled on the process, plan, and necessity for staying for the completion/evaluation, and the remainder of the medical screening examination    Janne Napoleon, NP 08/08/18 1610    Gwyneth Sprout, MD 08/09/18 1427

## 2018-08-08 NOTE — ED Notes (Signed)
Pt asked RN for more pain medications.  Pt aware that she has been moved to a room and can ask the EDP when they are seen

## 2018-08-09 MED ORDER — ONDANSETRON 4 MG PO TBDP: 4.0000 mg | ORAL_TABLET | Freq: Three times a day (TID) | ORAL | 0 refills | Status: DC | PRN

## 2018-08-09 MED ORDER — OXYCODONE-ACETAMINOPHEN 5-325 MG PO TABS: 1.0000 | ORAL_TABLET | ORAL | 0 refills | Status: DC | PRN

## 2018-08-09 MED ORDER — CIPROFLOXACIN HCL 500 MG PO TABS: 500.0000 mg | ORAL_TABLET | Freq: Two times a day (BID) | ORAL | 0 refills | Status: DC

## 2018-08-09 MED ORDER — METRONIDAZOLE 500 MG PO TABS: 500.0000 mg | ORAL_TABLET | Freq: Two times a day (BID) | ORAL | 0 refills | Status: DC

## 2018-08-09 NOTE — ED Notes (Signed)
Patient verbalizes understanding of discharge instructions. Opportunity for questioning and answers were provided. Armband removed by staff, pt discharged from ED ambulatory.   

## 2018-08-09 NOTE — Discharge Instructions (Signed)
Take the prescribed medication as directed.  Monitor diet like we talked about-- see papers on back for reference. Follow-up with your primary care doctor.  You may also benefit from follow-up with GI doctor Return to the ED for new or worsening symptoms.

## 2018-09-26 ENCOUNTER — Observation Stay (HOSPITAL_COMMUNITY)
Admission: EM | Admit: 2018-09-26 | Discharge: 2018-09-28 | Disposition: A | Discharge status: Home / Self Care | Payer: Medicaid Other | Attending: Physician Assistant | Admitting: Physician Assistant

## 2018-09-26 ENCOUNTER — Emergency Department (HOSPITAL_COMMUNITY): Payer: Medicaid Other

## 2018-09-26 ENCOUNTER — Encounter (HOSPITAL_COMMUNITY): Payer: Self-pay | Admitting: Emergency Medicine

## 2018-09-26 DIAGNOSIS — R109 Unspecified abdominal pain: Secondary | ICD-10-CM

## 2018-09-26 DIAGNOSIS — Z79899 Other long term (current) drug therapy: Secondary | ICD-10-CM | POA: Diagnosis not present

## 2018-09-26 DIAGNOSIS — F17210 Nicotine dependence, cigarettes, uncomplicated: Secondary | ICD-10-CM | POA: Diagnosis not present

## 2018-09-26 DIAGNOSIS — R1032 Left lower quadrant pain: Secondary | ICD-10-CM | POA: Diagnosis present

## 2018-09-26 DIAGNOSIS — M549 Dorsalgia, unspecified: Secondary | ICD-10-CM | POA: Diagnosis present

## 2018-09-26 DIAGNOSIS — Z888 Allergy status to other drugs, medicaments and biological substances status: Secondary | ICD-10-CM | POA: Diagnosis not present

## 2018-09-26 DIAGNOSIS — Z885 Allergy status to narcotic agent status: Secondary | ICD-10-CM | POA: Insufficient documentation

## 2018-09-26 DIAGNOSIS — K760 Fatty (change of) liver, not elsewhere classified: Secondary | ICD-10-CM | POA: Diagnosis not present

## 2018-09-26 HISTORY — DX: Anxiety disorder, unspecified: F41.9

## 2018-09-26 HISTORY — DX: Alcohol dependence, uncomplicated: F10.20

## 2018-09-26 HISTORY — DX: Major depressive disorder, single episode, unspecified: F32.9

## 2018-09-26 HISTORY — DX: Depression, unspecified: F32.A

## 2018-09-26 LAB — CBC
HEMATOCRIT: 42.5 % (ref 36.0–46.0)
Hemoglobin: 13.4 g/dL (ref 12.0–15.0)
MCH: 33.1 pg (ref 26.0–34.0)
MCHC: 31.5 g/dL (ref 30.0–36.0)
MCV: 104.9 fL — AB (ref 80.0–100.0)
Platelets: 305 10*3/uL (ref 150–400)
RBC: 4.05 MIL/uL (ref 3.87–5.11)
RDW: 14.3 % (ref 11.5–15.5)
WBC: 5.1 10*3/uL (ref 4.0–10.5)
nRBC: 0 % (ref 0.0–0.2)

## 2018-09-26 LAB — COMPREHENSIVE METABOLIC PANEL
ALBUMIN: 4 g/dL (ref 3.5–5.0)
ALT: 17 U/L (ref 0–44)
AST: 28 U/L (ref 15–41)
Alkaline Phosphatase: 62 U/L (ref 38–126)
Anion gap: 15 (ref 5–15)
BUN: 5 mg/dL — AB (ref 8–23)
CHLORIDE: 102 mmol/L (ref 98–111)
CO2: 21 mmol/L — AB (ref 22–32)
CREATININE: 0.73 mg/dL (ref 0.44–1.00)
Calcium: 9.5 mg/dL (ref 8.9–10.3)
GFR calc Af Amer: >60 mL/min (ref >60)
GFR calc non Af Amer: >60 mL/min (ref >60)
Glucose, Bld: 102 mg/dL — ABNORMAL HIGH (ref 70–99)
POTASSIUM: 3.9 mmol/L (ref 3.5–5.1)
SODIUM: 138 mmol/L (ref 135–145)
Total Bilirubin: 0.7 mg/dL (ref 0.3–1.2)
Total Protein: 7.4 g/dL (ref 6.5–8.1)

## 2018-09-26 LAB — URINALYSIS, ROUTINE W REFLEX MICROSCOPIC
BILIRUBIN URINE: NEGATIVE
GLUCOSE, UA: NEGATIVE mg/dL
Hgb urine dipstick: NEGATIVE
KETONES UR: NEGATIVE mg/dL
LEUKOCYTES UA: NEGATIVE
Nitrite: NEGATIVE
PH: 6.5 (ref 5.0–8.0)
Protein, ur: NEGATIVE mg/dL

## 2018-09-26 LAB — LIPASE, BLOOD: LIPASE: 37 U/L (ref 11–51)

## 2018-09-26 MED ORDER — CALCIUM CARBONATE ANTACID 500 MG PO CHEW
1.0000 | CHEWABLE_TABLET | ORAL | Status: DC | PRN
  Filled 2018-09-26: qty 1

## 2018-09-26 MED ORDER — ENOXAPARIN SODIUM 40 MG/0.4ML ~~LOC~~ SOLN
40.0000 mg | SUBCUTANEOUS | Status: DC
  Administered 2018-09-27 – 2018-09-28 (×2): 40 mg via SUBCUTANEOUS
  Filled 2018-09-26 (×3): qty 0.4

## 2018-09-26 MED ORDER — HYDROMORPHONE HCL 1 MG/ML IJ SOLN
0.5000 mg | INTRAMUSCULAR | Status: DC | PRN
  Administered 2018-09-26 – 2018-09-28 (×10): 0.5 mg via INTRAVENOUS
  Filled 2018-09-26 (×9): qty 1

## 2018-09-26 MED ORDER — MORPHINE SULFATE (PF) 4 MG/ML IV SOLN
4.0000 mg | Freq: Once | INTRAVENOUS | Status: AC
  Administered 2018-09-26: 4 mg via INTRAVENOUS
  Filled 2018-09-26: qty 1

## 2018-09-26 MED ORDER — SODIUM CHLORIDE 0.9 % IV SOLN
2.0000 g | INTRAVENOUS | Status: DC
  Administered 2018-09-26 – 2018-09-27 (×2): 2 g via INTRAVENOUS
  Filled 2018-09-26 (×3): qty 20

## 2018-09-26 MED ORDER — ONDANSETRON HCL 4 MG/2ML IJ SOLN
4.0000 mg | Freq: Once | INTRAMUSCULAR | Status: AC
  Administered 2018-09-26: 4 mg via INTRAVENOUS
  Filled 2018-09-26: qty 2

## 2018-09-26 MED ORDER — METRONIDAZOLE IN NACL 5-0.79 MG/ML-% IV SOLN
500.0000 mg | Freq: Three times a day (TID) | INTRAVENOUS | Status: DC
  Administered 2018-09-26 – 2018-09-28 (×6): 500 mg via INTRAVENOUS
  Filled 2018-09-26 (×6): qty 100

## 2018-09-26 MED ORDER — PANTOPRAZOLE SODIUM 20 MG PO TBEC
20.0000 mg | DELAYED_RELEASE_TABLET | Freq: Every day | ORAL | Status: DC
  Administered 2018-09-27 – 2018-09-28 (×2): 20 mg via ORAL
  Filled 2018-09-26 (×2): qty 1

## 2018-09-26 MED ORDER — METOPROLOL TARTRATE 5 MG/5ML IV SOLN: 5.0000 mg | Freq: Four times a day (QID) | INTRAVENOUS | Status: DC | PRN

## 2018-09-26 MED ORDER — IOHEXOL 300 MG/ML  SOLN: 100.0000 mL | Freq: Once | INTRAMUSCULAR | Status: DC | PRN

## 2018-09-26 MED ORDER — GABAPENTIN 800 MG PO TABS
800.0000 mg | ORAL_TABLET | Freq: Four times a day (QID) | ORAL | Status: DC
  Filled 2018-09-26 (×3): qty 1

## 2018-09-26 MED ORDER — DIPHENHYDRAMINE HCL 12.5 MG/5ML PO ELIX: 12.5000 mg | ORAL_SOLUTION | Freq: Four times a day (QID) | ORAL | Status: DC | PRN

## 2018-09-26 MED ORDER — ONDANSETRON HCL 4 MG/2ML IJ SOLN
4.0000 mg | Freq: Four times a day (QID) | INTRAMUSCULAR | Status: DC | PRN
  Administered 2018-09-26 – 2018-09-28 (×6): 4 mg via INTRAVENOUS
  Filled 2018-09-26 (×6): qty 2

## 2018-09-26 MED ORDER — HYDROMORPHONE HCL 1 MG/ML IJ SOLN
0.5000 mg | INTRAMUSCULAR | Status: DC | PRN
  Filled 2018-09-26: qty 1

## 2018-09-26 MED ORDER — CYCLOBENZAPRINE HCL 10 MG PO TABS: 10.0000 mg | ORAL_TABLET | Freq: Three times a day (TID) | ORAL | Status: DC | PRN

## 2018-09-26 MED ORDER — ACETAMINOPHEN 325 MG PO TABS: 650.0000 mg | ORAL_TABLET | Freq: Four times a day (QID) | ORAL | Status: DC | PRN

## 2018-09-26 MED ORDER — ONDANSETRON 4 MG PO TBDP
4.0000 mg | ORAL_TABLET | Freq: Four times a day (QID) | ORAL | Status: DC | PRN
  Filled 2018-09-26: qty 1

## 2018-09-26 MED ORDER — HYDROMORPHONE HCL 1 MG/ML IJ SOLN
0.5000 mg | Freq: Once | INTRAMUSCULAR | Status: AC
  Administered 2018-09-26: 0.5 mg via INTRAVENOUS
  Filled 2018-09-26: qty 1

## 2018-09-26 MED ORDER — DIPHENHYDRAMINE HCL 50 MG/ML IJ SOLN: 12.5000 mg | Freq: Four times a day (QID) | INTRAMUSCULAR | Status: DC | PRN

## 2018-09-26 MED ORDER — POLYETHYLENE GLYCOL 3350 17 G PO PACK: 17.0000 g | PACK | Freq: Every day | ORAL | Status: DC | PRN

## 2018-09-26 MED ORDER — ACETAMINOPHEN 650 MG RE SUPP: 650.0000 mg | Freq: Four times a day (QID) | RECTAL | Status: DC | PRN

## 2018-09-26 MED ORDER — IOHEXOL 300 MG/ML  SOLN
100.0000 mL | Freq: Once | INTRAMUSCULAR | Status: AC | PRN
  Administered 2018-09-26: 100 mL via INTRAVENOUS

## 2018-09-26 MED ORDER — OXYCODONE HCL 5 MG PO TABS
5.0000 mg | ORAL_TABLET | ORAL | Status: DC | PRN
  Administered 2018-09-27 – 2018-09-28 (×3): 10 mg via ORAL
  Filled 2018-09-26 (×3): qty 2

## 2018-09-26 MED ORDER — FLUOXETINE HCL 20 MG PO TABS: 20.0000 mg | ORAL_TABLET | Freq: Every day | ORAL | Status: DC

## 2018-09-26 MED ORDER — NICOTINE 21 MG/24HR TD PT24
21.0000 mg | MEDICATED_PATCH | Freq: Every day | TRANSDERMAL | Status: DC
  Administered 2018-09-26 – 2018-09-27 (×2): 21 mg via TRANSDERMAL
  Filled 2018-09-26 (×2): qty 1

## 2018-09-26 MED ORDER — KCL IN DEXTROSE-NACL 20-5-0.45 MEQ/L-%-% IV SOLN
INTRAVENOUS | Status: DC
  Administered 2018-09-26 – 2018-09-28 (×2): via INTRAVENOUS
  Filled 2018-09-26 (×3): qty 1000

## 2018-09-26 NOTE — ED Notes (Signed)
Patient transported to CT 

## 2018-09-26 NOTE — H&P (Signed)
[facility] Consult/Admission Note  [name] July 20, 1956  [id number].    Requesting MD: Effie Shy Chief Complaint/Reason for Consult: abdominal pain   HPI:  Patient is a 63 year old female who presented to [facility] today with sudden onset LLQ pain radiating into L flank and back. Patient reports pain started 2 days ago and has progressively worsened. Worse with movement, coughing, urinating, having BM's. Denies fever, chills. Reports some nausea and dark diarrhea this AM. This feels more severe than past episode of diverticulitis. Denies urinary or vaginal symptoms as well.   She was recently (08/08/18) treated for diverticulitis with PO cipro/flagyl, this appears improved on today's CT but now has minimal prominence of appendix.  PMH significant for chronic pain with herniated disk. Allergic to tramadol and toradol. Not on any blood thinning medications. Past abdominal surgeries include cholecystectomy and abdominal hysterectomy.   ROS: Review of Systems  Constitutional: Negative for chills, diaphoresis and fever.  HENT: Negative for sore throat.   Respiratory: Negative for cough and shortness of breath.   Cardiovascular: Negative for chest pain.  Gastrointestinal: Positive for abdominal pain, melena and nausea. Negative for blood in stool, constipation, diarrhea and vomiting.  Genitourinary: Negative for dysuria.  Skin: Negative for rash.  Neurological: Negative for dizziness and loss of consciousness.  All other systems reviewed and are negative.   No family history on file.  Past Medical History:  Diagnosis Date  . Chronic pain   . Herniated cervical disc     Past Surgical History:  Procedure Laterality Date  . ABDOMINAL HYSTERECTOMY    . ankle surgey (right)    . carpel tunnel release Bilateral   . CERVICAL FUSION    . CHOLECYSTECTOMY    . KNEE SURGERY      Social History:  reports that she has been smoking cigarettes. She has been smoking about 0.50  packs per day. She has never used smokeless tobacco. She reports that she drinks alcohol. She reports that she does not use drugs.  Allergies:  Allergies  Allergen Reactions  . Ketorolac Hives  . Tramadol Hives     (Not in a hospital admission)  Blood pressure (!) 114/100, pulse 64, temperature 98.3 F (36.8 C), temperature source Oral, resp. rate 16, SpO2 100 %. Physical Exam: Physical Exam  Constitutional: She is oriented to person, place, and time. She appears well-developed and well-nourished.  Non-toxic appearance. She does not appear ill. No distress.  HENT:  Head: Normocephalic and atraumatic.  Nose: Nose normal.  Mouth/Throat: Oropharynx is clear and moist and mucous membranes are normal. No oropharyngeal exudate.  Eyes: Pupils are equal, round, and reactive to light. Conjunctivae are normal. Right eye exhibits no discharge. Left eye exhibits no discharge. No scleral icterus.  Neck: Normal range of motion. Neck supple. No thyromegaly present.  Cardiovascular: Normal rate, regular rhythm, normal heart sounds and intact distal pulses.  No murmur heard. Pulses:      Radial pulses are 2+ on the right side, and 2+ on the left side.  Pulmonary/Chest: Effort normal and breath sounds normal. No respiratory distress. She has no wheezes. She has no rhonchi. She has no rales.  Abdominal: Soft. Normal appearance and bowel sounds are normal. She exhibits no distension. There is no hepatosplenomegaly. There is tenderness in the right lower quadrant, suprapubic area and left lower quadrant. There is no rigidity and no guarding.  Worse in LLQ. Midline well healed scar  Musculoskeletal: Normal range of motion. She exhibits no edema, tenderness  or deformity.  Lymphadenopathy:    She has no cervical adenopathy.  Neurological: She is alert and oriented to person, place, and time.  Skin: Skin is warm and dry. No rash noted. She is not diaphoretic.  Psychiatric: Her speech is normal. Her mood  appears anxious.  Nursing note and vitals reviewed.   Results for orders placed or performed during the hospital encounter of 09/26/18 (from the past 48 hour(s))  Lipase, blood     Status: None   Collection Time: 09/26/18 10:59 AM  Result Value Ref Range   Lipase 37 11 - 51 U/L    Comment: Performed at [facility], [address] [place], [place] [address]  Comprehensive metabolic panel     Status: Abnormal   Collection Time: 09/26/18 10:59 AM  Result Value Ref Range   Sodium 138 135 - 145 mmol/L   Potassium 3.9 3.5 - 5.1 mmol/L   Chloride 102 98 - 111 mmol/L   CO2 21 (L) 22 - 32 mmol/L   Glucose, Bld 102 (H) 70 - 99 mg/dL   BUN 5 (L) 8 - 23 mg/dL   Creatinine, Ser 6.040.73 0.44 - 1.00 mg/dL   Calcium 9.5 8.9 - 54.010.3 mg/dL   Total Protein 7.4 6.5 - 8.1 g/dL   Albumin 4.0 3.5 - 5.0 g/dL   AST 28 15 - 41 U/L   ALT 17 0 - 44 U/L   Alkaline Phosphatase 62 38 - 126 U/L   Total Bilirubin 0.7 0.3 - 1.2 mg/dL   GFR calc non Af Amer >60 >60 mL/min   GFR calc Af Amer >60 >60 mL/min   Anion gap 15 5 - 15    Comment: Performed at [facility], [address] [place], [place] [address]  CBC     Status: Abnormal   Collection Time: 09/26/18 10:59 AM  Result Value Ref Range   WBC 5.1 4.0 - 10.5 K/uL   RBC 4.05 3.87 - 5.11 MIL/uL   Hemoglobin 13.4 12.0 - 15.0 g/dL   HCT 14.742.5 82.936.0 - 56.246.0 %   MCV 104.9 (H) 80.0 - 100.0 fL   MCH 33.1 26.0 - 34.0 pg   MCHC 31.5 30.0 - 36.0 g/dL   RDW 13.014.3 86.511.5 - 78.415.5 %   Platelets 305 150 - 400 K/uL   nRBC 0.0 0.0 - 0.2 %    Comment: Performed at [facility], [address] [place], [place] [address]  Urinalysis, Routine w reflex microscopic     Status: Abnormal   Collection Time: 09/26/18 11:07 AM  Result Value Ref Range   Color, Urine YELLOW YELLOW   APPearance CLEAR CLEAR   Specific Gravity, Urine <1.005 (L) 1.005 - 1.030   pH 6.5 5.0 - 8.0   Glucose, UA NEGATIVE NEGATIVE mg/dL   Hgb urine dipstick NEGATIVE NEGATIVE   Bilirubin  Urine NEGATIVE NEGATIVE   Ketones, ur NEGATIVE NEGATIVE mg/dL   Protein, ur NEGATIVE NEGATIVE mg/dL   Nitrite NEGATIVE NEGATIVE   Leukocytes, UA NEGATIVE NEGATIVE    Comment: Microscopic not done on urines with negative protein, blood, leukocytes, nitrite, or glucose < 500 mg/dL. Performed at [facility], [address] [place], [place] [address]    Ct Abdomen Pelvis W Contrast  Result Date: 09/26/2018 CLINICAL DATA:  Left lower quadrant pain. EXAM: CT ABDOMEN AND PELVIS WITH CONTRAST TECHNIQUE: Multidetector CT imaging of the abdomen and pelvis was performed using the standard protocol following bolus administration of  intravenous contrast. CONTRAST:  OMNIPAQUE IOHEXOL 300 MG/ML  SOLN COMPARISON:  08/08/2018 FINDINGS: Lower chest: Normal. Hepatobiliary: Diffuse slight hepatic steatosis. Cholecystectomy. Chronic dilatation of the common hepatic and common bile duct. Pancreas: Unremarkable. No pancreatic ductal dilatation or surrounding inflammatory changes. Spleen: Normal in size without focal abnormality. Adrenals/Urinary Tract: Adrenal glands are normal. Stable 7 mm cyst in the upper pole of the left kidney. The kidneys are otherwise normal. Bladder is normal. Stomach/Bowel: Findings of acute diverticulitis noted in the left lower quadrant on the prior study have resolved. There are multiple diverticula in the sigmoid portion of the colon. There is a tiny amount of residual fluid in the pelvic cul-de-sac,, improved. The stomach and small bowel appear normal including the terminal ileum. The appendix is slightly more prominent than on the prior study and slightly enhances but there is no periappendiceal inflammation. Vascular/Lymphatic: No significant vascular findings are present. No enlarged abdominal or pelvic lymph nodes. Reproductive: Status post hysterectomy. No adnexal masses. Other: No abdominal wall hernia. No free air. Reduced tiny amount of free fluid in the pelvis.  Musculoskeletal: No acute or significant osseous findings. IMPRESSION: 1. Resolution of diverticulitis in proximal sigmoid portion of the colon. 2. Minimal new prominence of the appendix with slight enhancement of the mucosa but without periappendiceal inflammation. 3. Mild chronic hepatic steatosis. Electronically Signed   By: Francene Boyers M.D.   On: 09/26/2018 15:17      Assessment/Plan Hx of diverticulitis Chronic pain  Possible early appendicitis - CT: resolution of diverticulitis, minimal new prominence of appendix with slight mucosal enhancement, no periappendiceal inflammation - this CT finding could be more related to reactive inflammation from recent diverticulitis rather than true appendicitis - WBC 5, afebrile - abdominal exam showed more pain in LLQ than RLQ - can admit for observation and place on abx - if pain more localized tomorrow may proceed with lap appy  FEN: IVF,  VTE: SCDs ID: Rocephin & Flagyl 12/09>> Follow up: TBD  Wells Guiles, [facility] 09/26/2018, 3:59 PM Pager: [phone] Consults: [phone] Mon-Fri 7:00 am-4:30 pm Sat-Sun 7:00 am-11:30 am

## 2018-09-26 NOTE — ED Provider Notes (Signed)
[facility] EMERGENCY DEPARTMENT Provider Note   CSN: [id number] Arrival date & time: 09/26/18  1018     History   Chief Complaint Chief Complaint  Patient presents with  . Abdominal Pain    HPI [name] is a 62 y.o. female.  HPI   62 year old female presents today with complaints of left-sided lower abdominal pain.  Patient notes approximately 2 days ago she developed a sharp pain in the left lower quadrant radiating up to the left upper quadrant and right lower quadrant.  She denies any associated vomiting or fever, but notes nausea..  She reports some dark diarrhea this morning.  She denies any fever at home.  Patient notes a history of diverticulitis in the past, but notes this feels more severe.  She denies any urinary symptoms, denies any vaginal symptoms.   Past Medical History:  Diagnosis Date  . Chronic pain   . Herniated cervical disc     Patient Active Problem List   Diagnosis Date Noted  . Abdominal pain 09/26/2018  . Alcohol dependence with uncomplicated withdrawal (HCC) 05/16/2018    Past Surgical History:  Procedure Laterality Date  . ABDOMINAL HYSTERECTOMY    . ankle surgey (right)    . carpel tunnel release Bilateral   . CERVICAL FUSION    . CHOLECYSTECTOMY    . KNEE SURGERY       OB History   None      Home Medications    Prior to Admission medications   Medication Sig Start Date End Date Taking? Authorizing Provider  calcium carbonate (TUMS - DOSED IN MG ELEMENTAL CALCIUM) 500 MG chewable tablet Chew 1 tablet by mouth as needed for indigestion or heartburn.   Yes [provider]  cyclobenzaprine (FLEXERIL) 10 MG tablet Take 1 tablet (10 mg total) by mouth 3 (three) times daily. 08/05/18  Yes Bast, Traci A, NP  gabapentin (NEURONTIN) 800 MG tablet Take 1 tablet (800 mg total) by mouth 4 (four) times daily. 08/05/18  Yes Bast, Traci A, NP  pantoprazole (PROTONIX) 20 MG tablet Take 1 tablet (20 mg total) by  mouth daily. 08/05/18  Yes Bast, Traci A, NP  FLUoxetine (PROZAC) 20 MG tablet Take 1 tablet (20 mg total) by mouth daily. Patient taking differently: Take 20 mg by mouth at bedtime.  08/05/18   Janace Aris, NP    Family History No family history on file.  Social History Social History   Tobacco Use  . Smoking status: Current Every Day Smoker    Packs/day: 0.50    Types: Cigarettes  . Smokeless tobacco: Never Used  Substance Use Topics  . Alcohol use: Yes  . Drug use: No     Allergies   Ketorolac and Tramadol   Review of Systems Review of Systems  All other systems reviewed and are negative.    Physical Exam Updated Vital Signs BP (!) 114/100   Pulse 64   Temp 98.3 F (36.8 C) (Oral)   Resp 16   SpO2 100%   Physical Exam  Constitutional: She is oriented to person, place, and time. She appears well-developed and well-nourished.  HENT:  Head: Normocephalic and atraumatic.  Eyes: Pupils are equal, round, and reactive to light. Conjunctivae are normal. Right eye exhibits no discharge. Left eye exhibits no discharge. No scleral icterus.  Neck: Normal range of motion. No JVD present. No tracheal deviation present.  Pulmonary/Chest: Effort normal. No stridor.  Neurological: She is alert and oriented to  person, place, and time. Coordination normal.  Psychiatric: She has a normal mood and affect. Her behavior is normal. Judgment and thought content normal.  Nursing note and vitals reviewed.    ED Treatments / Results  Labs (all labs ordered are listed, but only abnormal results are displayed) Labs Reviewed  COMPREHENSIVE METABOLIC PANEL - Abnormal; Notable for the following components:      Result Value   CO2 21 (*)    Glucose, Bld 102 (*)    BUN 5 (*)    All other components within normal limits  CBC - Abnormal; Notable for the following components:   MCV 104.9 (*)    All other components within normal limits  URINALYSIS, ROUTINE W REFLEX MICROSCOPIC -  Abnormal; Notable for the following components:   Specific Gravity, Urine <1.005 (*)    All other components within normal limits  LIPASE, BLOOD  CBC  CREATININE, SERUM    EKG None  Radiology Ct Abdomen Pelvis W Contrast  Result Date: 09/26/2018 CLINICAL DATA:  Left lower quadrant pain. EXAM: CT ABDOMEN AND PELVIS WITH CONTRAST TECHNIQUE: Multidetector CT imaging of the abdomen and pelvis was performed using the standard protocol following bolus administration of intravenous contrast. CONTRAST:  OMNIPAQUE IOHEXOL 300 MG/ML  SOLN COMPARISON:  08/08/2018 FINDINGS: Lower chest: Normal. Hepatobiliary: Diffuse slight hepatic steatosis. Cholecystectomy. Chronic dilatation of the common hepatic and common bile duct. Pancreas: Unremarkable. No pancreatic ductal dilatation or surrounding inflammatory changes. Spleen: Normal in size without focal abnormality. Adrenals/Urinary Tract: Adrenal glands are normal. Stable 7 mm cyst in the upper pole of the left kidney. The kidneys are otherwise normal. Bladder is normal. Stomach/Bowel: Findings of acute diverticulitis noted in the left lower quadrant on the prior study have resolved. There are multiple diverticula in the sigmoid portion of the colon. There is a tiny amount of residual fluid in the pelvic cul-de-sac,, improved. The stomach and small bowel appear normal including the terminal ileum. The appendix is slightly more prominent than on the prior study and slightly enhances but there is no periappendiceal inflammation. Vascular/Lymphatic: No significant vascular findings are present. No enlarged abdominal or pelvic lymph nodes. Reproductive: Status post hysterectomy. No adnexal masses. Other: No abdominal wall hernia. No free air. Reduced tiny amount of free fluid in the pelvis. Musculoskeletal: No acute or significant osseous findings. IMPRESSION: 1. Resolution of diverticulitis in proximal sigmoid portion of the colon. 2. Minimal new prominence of the  appendix with slight enhancement of the mucosa but without periappendiceal inflammation. 3. Mild chronic hepatic steatosis. Electronically Signed   By: Francene Boyers M.D.   On: 09/26/2018 15:17    Procedures Procedures (including critical care time)  Medications Ordered in ED Medications  enoxaparin (LOVENOX) injection 40 mg (has no administration in time range)  dextrose 5 % and 0.45 % NaCl with KCl 20 mEq/L infusion (has no administration in time range)  cefTRIAXone (ROCEPHIN) 2 g in sodium chloride 0.9 % 100 mL IVPB (has no administration in time range)    And  metroNIDAZOLE (FLAGYL) IVPB 500 mg (has no administration in time range)  acetaminophen (TYLENOL) tablet 650 mg (has no administration in time range)    Or  acetaminophen (TYLENOL) suppository 650 mg (has no administration in time range)  oxyCODONE (Oxy IR/ROXICODONE) immediate release tablet 5-10 mg (has no administration in time range)  HYDROmorphone (DILAUDID) injection 0.5 mg (has no administration in time range)  diphenhydrAMINE (BENADRYL) 12.5 MG/5ML elixir 12.5 mg (has no administration in time range)  Or  diphenhydrAMINE (BENADRYL) injection 12.5 mg (has no administration in time range)  polyethylene glycol (MIRALAX / GLYCOLAX) packet 17 g (has no administration in time range)  ondansetron (ZOFRAN-ODT) disintegrating tablet 4 mg (has no administration in time range)    Or  ondansetron (ZOFRAN) injection 4 mg (has no administration in time range)  nicotine (NICODERM CQ - dosed in mg/24 hours) patch 21 mg (has no administration in time range)  metoprolol tartrate (LOPRESSOR) injection 5 mg (has no administration in time range)  calcium carbonate (TUMS - dosed in mg elemental calcium) chewable tablet 200 mg of elemental calcium (has no administration in time range)  cyclobenzaprine (FLEXERIL) tablet 10 mg (has no administration in time range)  FLUoxetine (PROZAC) tablet 20 mg (has no administration in time range)    gabapentin (NEURONTIN) tablet 800 mg (has no administration in time range)  pantoprazole (PROTONIX) EC tablet 20 mg (has no administration in time range)  ondansetron (ZOFRAN) injection 4 mg (4 mg Intravenous Given 09/26/18 1128)  morphine 4 MG/ML injection 4 mg (4 mg Intravenous Given 09/26/18 1129)  HYDROmorphone (DILAUDID) injection 0.5 mg (0.5 mg Intravenous Given 09/26/18 1418)  iohexol (OMNIPAQUE) 300 MG/ML solution 100 mL (100 mLs Intravenous Contrast Given 09/26/18 1507)     Initial Impression / Assessment and Plan / ED Course  I have reviewed the triage vital signs and the nursing notes.  Pertinent labs & imaging results that were available during my care of the patient were reviewed by me and considered in my medical decision making (see chart for details).     Labs: UA, Lipase, CMP, CBC  Imaging: CT abdomen pelvis with contrast  Consults: General surgery  Therapeutics:  Discharge Meds:   Assessment/Plan: 62 year old female presents today with abdominal pain.  Although she has left sided vomiting this does radiate over to the right.  Her labs are reassuring with no elevation in white count.  She is acutely uncomfortable and was given pain medicine here.  Her CT scan is reassuring with no signs of acute diverticulitis although she does have new prominence of the appendix.  General surgery consulted who recommends overnight observation.   Final Clinical Impressions(s) / ED Diagnoses   Final diagnoses:  Abdominal pain, unspecified abdominal location    ED Discharge Orders    None       Rosalio LoudHedges, Lonnell Chaput, PA-C 09/26/18 1619    Gerhard MunchLockwood, Robert, MD 09/27/18 1815

## 2018-09-26 NOTE — ED Triage Notes (Signed)
Pr reports Saturday she had a sudden onset of Left lower pain that radiates into left flank. Pt states it has been constant- helps to lay on left side.

## 2018-09-27 ENCOUNTER — Encounter (HOSPITAL_COMMUNITY): Payer: Self-pay | Admitting: *Deleted

## 2018-09-27 ENCOUNTER — Other Ambulatory Visit: Payer: Self-pay

## 2018-09-27 LAB — BASIC METABOLIC PANEL
Anion gap: 10 (ref 5–15)
BUN: 5 mg/dL — AB (ref 8–23)
CO2: 22 mmol/L (ref 22–32)
Calcium: 9 mg/dL (ref 8.9–10.3)
Chloride: 109 mmol/L (ref 98–111)
Creatinine, Ser: 0.82 mg/dL (ref 0.44–1.00)
GFR calc Af Amer: >60 mL/min (ref >60)
Glucose, Bld: 93 mg/dL (ref 70–99)
Potassium: 4.9 mmol/L (ref 3.5–5.1)
Sodium: 141 mmol/L (ref 135–145)

## 2018-09-27 LAB — CBC
HCT: 40.5 % (ref 36.0–46.0)
Hemoglobin: 12.8 g/dL (ref 12.0–15.0)
MCH: 32.7 pg (ref 26.0–34.0)
MCHC: 31.6 g/dL (ref 30.0–36.0)
MCV: 103.6 fL — ABNORMAL HIGH (ref 80.0–100.0)
Platelets: 139 10*3/uL — ABNORMAL LOW (ref 150–400)
RBC: 3.91 MIL/uL (ref 3.87–5.11)
RDW: 14.3 % (ref 11.5–15.5)
WBC: 3.8 10*3/uL — ABNORMAL LOW (ref 4.0–10.5)
nRBC: 0 % (ref 0.0–0.2)

## 2018-09-27 LAB — SURGICAL PCR SCREEN
MRSA, PCR: NEGATIVE
Staphylococcus aureus: NEGATIVE

## 2018-09-27 MED ORDER — GABAPENTIN 400 MG PO CAPS
800.0000 mg | ORAL_CAPSULE | Freq: Four times a day (QID) | ORAL | Status: DC
  Administered 2018-09-27 – 2018-09-28 (×6): 800 mg via ORAL
  Filled 2018-09-27 (×6): qty 2

## 2018-09-27 NOTE — Progress Notes (Signed)
[facility] Progress Note     Subjective: CC: abdominal pain Patient still describes a shooting pain in L abdomen. Feels hungry. Has not had a BM but is passing flatus. Reports she was having some dark stools prior to coming in. Scheduled with a new PCP in Jan, hoping they can help her get set up for colonoscopy.   Objective: Vital signs in last 24 hours: Temp:  [97.4 F (36.3 C)-98 F (36.7 C)] 98 F (36.7 C) (12/10 0612) Pulse Rate:  [63-72] 72 (12/10 0612) Resp:  [14-17] 17 (12/10 0612) BP: (114-157)/(66-100) 123/66 (12/10 0612) SpO2:  [97 %-100 %] 98 % (12/10 0612) Weight:  [58.1 kg] 58.1 kg (12/09 2338) Last BM Date: 09/26/18  Intake/Output from previous day: 12/09 0701 - 12/10 0700 In: 882.8 [I.V.:557.8; IV Piggyback:325] Out: -  Intake/Output this shift: No intake/output data recorded.  PE: Gen:  Alert, NAD, pleasant Card:  Regular rate and rhythm, pedal pulses 2+ BL Pulm:  Normal effort, clear to auscultation bilaterally Abd: Soft, minimally tender L side, non-distended, bowel sounds present Skin: warm and dry, no rashes  Psych: A&Ox3   Lab Results:  Recent Labs    09/26/18 1059 09/27/18 0205  WBC 5.1 3.8*  HGB 13.4 12.8  HCT 42.5 40.5  PLT 305 139*   BMET Recent Labs    09/26/18 1059 09/27/18 0205  NA 138 141  K 3.9 4.9  CL 102 109  CO2 21* 22  GLUCOSE 102* 93  BUN 5* 5*  CREATININE 0.73 0.82  CALCIUM 9.5 9.0   PT/INR No results for input(s): LABPROT, INR in the last 72 hours. CMP     Component Value Date/Time   NA 141 09/27/2018 0205   K 4.9 09/27/2018 0205   CL 109 09/27/2018 0205   CO2 22 09/27/2018 0205   GLUCOSE 93 09/27/2018 0205   BUN 5 (L) 09/27/2018 0205   CREATININE 0.82 09/27/2018 0205   CALCIUM 9.0 09/27/2018 0205   PROT 7.4 09/26/2018 1059   ALBUMIN 4.0 09/26/2018 1059   AST 28 09/26/2018 1059   ALT 17 09/26/2018 1059   ALKPHOS 62 09/26/2018 1059   BILITOT 0.7 09/26/2018 1059   GFRNONAA >60 09/27/2018  0205   GFRAA >60 09/27/2018 0205   Lipase     Component Value Date/Time   LIPASE 37 09/26/2018 1059       Studies/Results: Ct Abdomen Pelvis W Contrast  Result Date: 09/26/2018 CLINICAL DATA:  Left lower quadrant pain. EXAM: CT ABDOMEN AND PELVIS WITH CONTRAST TECHNIQUE: Multidetector CT imaging of the abdomen and pelvis was performed using the standard protocol following bolus administration of intravenous contrast. CONTRAST:  100mL OMNIPAQUE IOHEXOL 300 MG/ML  SOLN COMPARISON:  08/08/2018 FINDINGS: Lower chest: Normal. Hepatobiliary: Diffuse slight hepatic steatosis. Cholecystectomy. Chronic dilatation of the common hepatic and common bile duct. Pancreas: Unremarkable. No pancreatic ductal dilatation or surrounding inflammatory changes. Spleen: Normal in size without focal abnormality. Adrenals/Urinary Tract: Adrenal glands are normal. Stable 7 mm cyst in the upper pole of the left kidney. The kidneys are otherwise normal. Bladder is normal. Stomach/Bowel: Findings of acute diverticulitis noted in the left lower quadrant on the prior study have resolved. There are multiple diverticula in the sigmoid portion of the colon. There is a tiny amount of residual fluid in the pelvic cul-de-sac,, improved. The stomach and small bowel appear normal including the terminal ileum. The appendix is slightly more prominent than on the prior study and slightly enhances but there is no periappendiceal  inflammation. Vascular/Lymphatic: No significant vascular findings are present. No enlarged abdominal or pelvic lymph nodes. Reproductive: Status post hysterectomy. No adnexal masses. Other: No abdominal wall hernia. No free air. Reduced tiny amount of free fluid in the pelvis. Musculoskeletal: No acute or significant osseous findings. IMPRESSION: 1. Resolution of diverticulitis in proximal sigmoid portion of the colon. 2. Minimal new prominence of the appendix with slight enhancement of the mucosa but without  periappendiceal inflammation. 3. Mild chronic hepatic steatosis. Electronically Signed   By: Francene Boyers M.D.   On: 09/26/2018 15:17    Anti-infectives: Anti-infectives (From admission, onward)   Start     Dose/Rate Route Frequency Ordered Stop   09/26/18 1700  cefTRIAXone (ROCEPHIN) 2 g in sodium chloride 0.9 % 100 mL IVPB     2 g 200 mL/hr over 30 Minutes Intravenous Every 24 hours 09/26/18 1617     09/26/18 1700  metroNIDAZOLE (FLAGYL) IVPB 500 mg     500 mg 100 mL/hr over 60 Minutes Intravenous Every 8 hours 09/26/18 1617         Assessment/Plan Hx of diverticulitis Chronic pain  Abdominal pain - CT: resolution of diverticulitis, minimal new prominence of appendix with slight mucosal enhancement, no periappendiceal inflammation - more likely recurrent pain/inflammation from recent diverticulitis - WBC 3, afebrile - abdominal exam pretty benign today - feed and if tolerating d/c PM vs tomorrow AM  FEN: IVF, soft diet VTE: SCDs ID: Rocephin & Flagyl 12/09>> Follow up: TBD  LOS: 0 days    Wells Guiles , [facility] 09/27/2018, 11:52 AM Pager: [phone] Consults: [phone] Mon-Fri 7:00 am-4:30 pm Sat-Sun 7:00 am-11:30 am

## 2018-09-28 ENCOUNTER — Encounter (HOSPITAL_COMMUNITY): Payer: Self-pay | Admitting: General Practice

## 2018-09-28 MED ORDER — ONDANSETRON 4 MG PO TBDP: 4.0000 mg | ORAL_TABLET | Freq: Four times a day (QID) | ORAL | 0 refills | Status: DC | PRN

## 2018-09-28 MED ORDER — CIPROFLOXACIN HCL 500 MG PO TABS: 500.0000 mg | ORAL_TABLET | Freq: Two times a day (BID) | ORAL | 0 refills | Status: DC

## 2018-09-28 MED ORDER — METRONIDAZOLE 500 MG PO TABS: 500.0000 mg | ORAL_TABLET | Freq: Two times a day (BID) | ORAL | 0 refills | Status: DC

## 2018-09-28 MED ORDER — OXYCODONE HCL 5 MG PO TABS: 5.0000 mg | ORAL_TABLET | Freq: Four times a day (QID) | ORAL | 0 refills | Status: DC | PRN

## 2018-09-28 MED ORDER — NICOTINE 21 MG/24HR TD PT24: 21.0000 mg | MEDICATED_PATCH | Freq: Every day | TRANSDERMAL | 0 refills | Status: DC

## 2018-09-28 MED ORDER — FLUOXETINE HCL 20 MG PO TABS: 20.0000 mg | ORAL_TABLET | Freq: Every day | ORAL | 0 refills | Status: DC

## 2018-09-28 MED ORDER — GABAPENTIN 800 MG PO TABS: 800.0000 mg | ORAL_TABLET | Freq: Four times a day (QID) | ORAL | 0 refills | Status: DC

## 2018-09-28 NOTE — Discharge Instructions (Signed)
Take entire antibiotic prescription. Follow soft/low fiber diet plan for the next 5-6 weeks, then transition to high fiber diet.   Diverticulitis Diverticulitis is when small pockets in your large intestine (colon) get infected or swollen. This causes stomach pain and watery poop (diarrhea). These pouches are called diverticula. They form in people who have a condition called diverticulosis. Follow these instructions at home: Medicines  Take over-the-counter and prescription medicines only as told by your doctor. These include: ? Antibiotics. ? Pain medicines. ? Fiber pills. ? Probiotics. ? Stool softeners.  Do not drive or use heavy machinery while taking prescription pain medicine.  If you were prescribed an antibiotic, take it as told. Do not stop taking it even if you feel better. General instructions  Follow a diet as told by your doctor.  When you feel better, your doctor may tell you to change your diet. You may need to eat a lot of fiber. Fiber makes it easier to poop (have bowel movements). Healthy foods with fiber include: ? Berries. ? Beans. ? Lentils. ? Green vegetables.  Exercise 3 or more times a week. Aim for 30 minutes each time. Exercise enough to sweat and make your heart beat faster.  Keep all follow-up visits as told. This is important. You may need to have an exam of the large intestine. This is called a colonoscopy. Contact a doctor if:  Your pain does not get better.  You have a hard time eating or drinking.  You are not pooping like normal. Get help right away if:  Your pain gets worse.  Your problems do not get better.  Your problems get worse very fast.  You have a fever.  You throw up (vomit) more than one time.  You have poop that is: ? Bloody. ? Black. ? Tarry. Summary  Diverticulitis is when small pockets in your large intestine (colon) get infected or swollen.  Take medicines only as told by your doctor.  Follow a diet as told  by your doctor. This information is not intended to replace advice given to you by your health care provider. Make sure you discuss any questions you have with your health care provider. Document Released: 03/23/2008 Document Revised: 10/22/2016 Document Reviewed: 10/22/2016 Elsevier Interactive Patient Education  2017 Elsevier Inc.  Soft-Food Meal Plan A soft-food meal plan includes foods that are safe and easy to swallow. This meal plan typically is used:  If you are having trouble chewing or swallowing foods.  As a transition meal plan after only having had liquid meals for a long period.  What do I need to know about the soft-food meal plan? A soft-food meal plan includes tender foods that are soft and easy to chew and swallow. In most cases, bite-sized pieces of food are easier to swallow. A bite-sized piece is about  inch or smaller. Foods in this plan do not need to be ground or pureed. Foods that are very hard, crunchy, or sticky should be avoided. Also, breads, cereals, yogurts, and desserts with nuts, seeds, or fruits should be avoided. What foods can I eat? Grains Rice and wild rice. Moist bread, dressing, pasta, and noodles. Well-moistened dry or cooked cereals, such as farina (cooked wheat cereal), oatmeal, or grits. Biscuits, breads, muffins, pancakes, and waffles that have been well moistened. Vegetables Shredded lettuce. Cooked, tender vegetables, including potatoes without skins. Vegetable juices. Broths or creamed soups made with vegetables that are not stringy or chewy. Strained tomatoes (without seeds). Fruits Canned or well-cooked  fruits. Soft (ripe), peeled fresh fruits, such as peaches, nectarines, kiwi, cantaloupe, honeydew melon, and watermelon (without seeds). Soft berries with small seeds, such as strawberries. Fruit juices (without pulp). Meats and Other Protein Sources Moist, tender, lean beef. Mutton. Lamb. Veal. Chicken. [place]. Liver. Ham. Fish without bones.  Eggs. Dairy Milk, milk drinks, and cream. Plain cream cheese and cottage cheese. Plain yogurt. Sweets/Desserts Flavored gelatin desserts. Custard. Plain ice cream, frozen yogurt, sherbet, milk shakes, and malts. Plain cakes and cookies. Plain hard candy. Other Butter, margarine (without trans fat), and cooking oils. Mayonnaise. Cream sauces. Mild spices, salt, and sugar. Syrup, molasses, honey, and jelly. The items listed above may not be a complete list of recommended foods or beverages. Contact your dietitian for more options. What foods are not recommended? Grains Dry bread, toast, crackers that have not been moistened. Coarse or dry cereals, such as bran, granola, and shredded wheat. Tough or chewy crusty breads, such as [place] bread or baguettes. Vegetables Corn. Raw vegetables except shredded lettuce. Cooked vegetables that are tough or stringy. Tough, crisp, fried potatoes and potato skins. Fruits Fresh fruits with skins or seeds or both, such as apples, pears, or grapes. Stringy, high-pulp fruits, such as papaya, pineapple, coconut, or mango. Fruit leather, fruit roll-ups, and all dried fruits. Meats and Other Protein Sources Sausages and hot dogs. Meats with gristle. Fish with bones. Nuts, seeds, and chunky peanut or other nut butters. Sweets/Desserts Cakes or cookies that are very dry or chewy. The items listed above may not be a complete list of foods and beverages to avoid. Contact your dietitian for more information. This information is not intended to replace advice given to you by your health care provider. Make sure you discuss any questions you have with your health care provider. Document Released: 01/12/2008 Document Revised: 03/12/2016 Document Reviewed: 09/01/2013 Elsevier Interactive Patient Education  2017 Elsevier Inc.  Low-Fiber Diet Fiber is found in fruits, vegetables, and whole grains. A low-fiber diet restricts fibrous foods that are not digested in the small  intestine. A diet containing about 10-15 grams of fiber per day is considered low fiber. Low-fiber diets may be used to:  Promote healing and rest the bowel during intestinal flare-ups.  Prevent blockage of a partially obstructed or narrowed gastrointestinal tract.  Reduce fecal weight and volume.  Slow the movement of feces.  You may be on a low-fiber diet as a transitional diet following surgery, after an injury (trauma), or because of a short (acute) or lifelong (chronic) illness. Your health care provider will determine the length of time you need to stay on this diet. What do I need to know about a low-fiber diet? Always check the fiber content on the packaging's Nutrition Facts label, especially on foods from the grains list. Ask your dietitian if you have questions about specific foods that are related to your condition, especially if the food is not listed below. In general, a low-fiber food will have less than 2 g of fiber. What foods can I eat? Grains All breads and crackers made with white flour. Sweet rolls, doughnuts, waffles, pancakes, [place] toast, bagels. Pretzels, Melba toast, zwieback. Well-cooked cereals, such as cornmeal, farina, or cream cereals. Dry cereals that do not contain whole grains, fruit, or nuts, such as refined corn, wheat, rice, and oat cereals. Potatoes prepared any way without skins, plain pastas and noodles, refined white rice. Use white flour for baking and making sauces. Use allowed list of grains for casseroles, dumplings, and puddings. Vegetables  Strained tomato and vegetable juices. Fresh lettuce, cucumber, spinach. Well-cooked (no skin or pulp) or canned vegetables, such as asparagus, bean sprouts, beets, carrots, green beans, mushrooms, potatoes, pumpkin, spinach, yellow squash, tomato sauce/puree, turnips, yams, and zucchini. Keep servings limited to  cup. Fruits All fruit juices except prune juice. Cooked or canned fruits without skin and seeds, such  as applesauce, apricots, cherries, fruit cocktail, grapefruit, grapes, mandarin oranges, melons, peaches, pears, pineapple, and plums. Fresh fruits without skin, such as apricots, avocados, bananas, melons, pineapple, nectarines, and peaches. Keep servings limited to  cup or 1 piece. Meat and Other Protein Sources Ground or well-cooked tender beef, ham, veal, lamb, pork, or poultry. Eggs, plain cheese. Fish, oysters, shrimp, lobster, and other seafood. Liver, organ meats. Smooth nut butters. Dairy All milk products and alternative dairy substitutes, such as soy, rice, almond, and coconut, not containing added whole nuts, seeds, or added fruit. Beverages Decaf coffee, fruit, and vegetable juices or smoothies (small amounts, with no pulp or skins, and with fruits from allowed list), sports drinks, herbal tea. Condiments Ketchup, mustard, vinegar, cream sauce, cheese sauce, cocoa powder. Spices in moderation, such as allspice, basil, bay leaves, celery powder or leaves, cinnamon, cumin powder, curry powder, ginger, mace, marjoram, onion or garlic powder, oregano, paprika, parsley flakes, ground pepper, rosemary, sage, savory, tarragon, thyme, and turmeric. Sweets and Desserts Plain cakes and cookies, pie made with allowed fruit, pudding, custard, cream pie. Gelatin, fruit, ice, sherbet, frozen ice pops. Ice cream, ice milk without nuts. Plain hard candy, honey, jelly, molasses, syrup, sugar, chocolate syrup, gumdrops, marshmallows. Limit overall sugar intake. Fats and Oil Margarine, butter, cream, mayonnaise, salad oils, plain salad dressings made from allowed foods. Choose healthy fats such as olive oil, canola oil, and omega-3 fatty acids (such as found in salmon or tuna) when possible. Other Bouillon, broth, or cream soups made from allowed foods. Any strained soup. Casseroles or mixed dishes made with allowed foods. The items listed above may not be a complete list of recommended foods or beverages.  Contact your dietitian for more options. What foods are not recommended? Grains All whole wheat and whole grain breads and crackers. Multigrains, rye, bran seeds, nuts, or coconut. Cereals containing whole grains, multigrains, bran, coconut, nuts, raisins. Cooked or dry oatmeal, steel-cut oats. Coarse wheat cereals, granola. Cereals advertised as high fiber. Potato skins. Whole grain pasta, wild or brown rice. Popcorn. Coconut flour. Bran, buckwheat, corn bread, multigrains, rye, wheat germ. Vegetables Fresh, cooked or canned vegetables, such as artichokes, asparagus, beet greens, broccoli, Brussels sprouts, cabbage, celery, cauliflower, corn, eggplant, kale, legumes or beans, okra, peas, and tomatoes. Avoid large servings of any vegetables, especially raw vegetables. Fruits Fresh fruits, such as apples with or without skin, berries, cherries, figs, grapes, grapefruit, guavas, kiwis, mangoes, oranges, papayas, pears, persimmons, pineapple, and pomegranate. Prune juice and juices with pulp, stewed or dried prunes. Dried fruits, dates, raisins. Fruit seeds or skins. Avoid large servings of all fresh fruits. Meats and Other Protein Sources Tough, fibrous meats with gristle. Chunky nut butter. Cheese made with seeds, nuts, or other foods not recommended. Nuts, seeds, legumes (beans, including baked beans), dried peas, beans, lentils. Dairy Yogurt or cheese that contains nuts, seeds, or added fruit. Beverages Fruit juices with high pulp, prune juice. Caffeinated coffee and teas. Condiments Coconut, maple syrup, pickles, olives. Sweets and Desserts Desserts, cookies, or candies that contain nuts or coconut, chunky peanut butter, dried fruits. Jams, preserves with seeds, marmalade. Large amounts of sugar and sweets.  Any other dessert made with fruits from the not recommended list. Other Soups made from vegetables that are not recommended or that contain other foods not recommended. The items listed  above may not be a complete list of foods and beverages to avoid. Contact your dietitian for more information. This information is not intended to replace advice given to you by your health care provider. Make sure you discuss any questions you have with your health care provider. Document Released: 03/27/2002 Document Revised: 03/12/2016 Document Reviewed: 08/28/2013 Elsevier Interactive Patient Education  2017 Elsevier Inc.   High-Fiber Diet Fiber, also called dietary fiber, is a type of carbohydrate found in fruits, vegetables, whole grains, and beans. A high-fiber diet can have many health benefits. Your health care provider may recommend a high-fiber diet to help:  Prevent constipation. Fiber can make your bowel movements more regular.  Lower your cholesterol.  Relieve hemorrhoids, uncomplicated diverticulosis, or irritable bowel syndrome.  Prevent overeating as part of a weight-loss plan.  Prevent heart disease, type 2 diabetes, and certain cancers.  What is my plan? The recommended daily intake of fiber includes:  38 grams for men under age 57.  30 grams for men over age 13.  25 grams for women under age 51.  21 grams for women over age 73.  You can get the recommended daily intake of dietary fiber by eating a variety of fruits, vegetables, grains, and beans. Your health care provider may also recommend a fiber supplement if it is not possible to get enough fiber through your diet. What do I need to know about a high-fiber diet?  Fiber supplements have not been widely studied for their effectiveness, so it is better to get fiber through food sources.  Always check the fiber content on thenutrition facts label of any prepackaged food. Look for foods that contain at least 5 grams of fiber per serving.  Ask your dietitian if you have questions about specific foods that are related to your condition, especially if those foods are not listed in the following  section.  Increase your daily fiber consumption gradually. Increasing your intake of dietary fiber too quickly may cause bloating, cramping, or gas.  Drink plenty of water. Water helps you to digest fiber. What foods can I eat? Grains Whole-grain breads. Multigrain cereal. Oats and oatmeal. Brown rice. Barley. Bulgur wheat. Millet. Bran muffins. Popcorn. Rye wafer crackers. Vegetables Sweet potatoes. Spinach. Kale. Artichokes. Cabbage. Broccoli. Green peas. Carrots. Squash. Fruits Berries. Pears. Apples. Oranges. Avocados. Prunes and raisins. Dried figs. Meats and Other Protein Sources [place], kidney, pinto, and soy beans. Split peas. Lentils. Nuts and seeds. Dairy Fiber-fortified yogurt. Beverages Fiber-fortified soy milk. Fiber-fortified orange juice. Other Fiber bars. The items listed above may not be a complete list of recommended foods or beverages. Contact your dietitian for more options. What foods are not recommended? Grains White bread. Pasta made with refined flour. White rice. Vegetables Fried potatoes. Canned vegetables. Well-cooked vegetables. Fruits Fruit juice. Cooked, strained fruit. Meats and Other Protein Sources Fatty cuts of meat. Fried Environmental education officer or fried fish. Dairy Milk. Yogurt. Cream cheese. Sour cream. Beverages Soft drinks. Other Cakes and pastries. Butter and oils. The items listed above may not be a complete list of foods and beverages to avoid. Contact your dietitian for more information. What are some tips for including high-fiber foods in my diet?  Eat a wide variety of high-fiber foods.  Make sure that half of all grains consumed each day are whole grains.  Replace breads and cereals made from refined flour or white flour with whole-grain breads and cereals.  Replace white rice with brown rice, bulgur wheat, or millet.  Start the day with a breakfast that is high in fiber, such as a cereal that contains at least 5 grams of fiber per  serving.  Use beans in place of meat in soups, salads, or pasta.  Eat high-fiber snacks, such as berries, raw vegetables, nuts, or popcorn. This information is not intended to replace advice given to you by your health care provider. Make sure you discuss any questions you have with your health care provider. Document Released: 10/05/2005 Document Revised: 03/12/2016 Document Reviewed: 03/20/2014 Elsevier Interactive Patient Education  [employer].

## 2018-09-28 NOTE — Social Work (Signed)
CSW spoke with pt at bedside, pt given a bus pass to discharge home.  She is aware of which bus to take and was requesting to speak with the RN to decide whether or not she would like to get the flu shot before she leaves today.  CSW signing off. Please consult if any additional needs arise.  Doy HutchingIsabel H Asli Tokarski, LCSWA [facility] Clinical Social Work [phone]

## 2018-09-28 NOTE — Progress Notes (Signed)
[name] to be D/C'd  per MD order. Discussed with the patient and all questions fully answered.  VSS, Skin clean, dry and intact without evidence of skin break down, no evidence of skin tears noted.  IV catheter discontinued intact. Site without signs and symptoms of complications. Dressing and pressure applied.  An After Visit Summary was printed and given to the patient. Patient received prescription.  D/c education completed with patient/family including follow up instructions, medication list, d/c activities limitations if indicated, with other d/c instructions as indicated by MD - patient able to verbalize understanding, all questions fully answered.   Patient instructed to return to ED, call 911, or call MD for any changes in condition.   Patient preferred to walk and D/C home via private auto.

## 2018-09-28 NOTE — Discharge Summary (Signed)
[facility] Discharge Summary   Patient ID: Emily FlakeDarlene Bordon MRN: [id number] DOB/AGE: [id number] y.o.  Admit date: 09/26/2018 Discharge date: 09/28/2018  Admitting Diagnosis: Abdominal pain  Discharge Diagnosis Patient Active Problem List   Diagnosis Date Noted  . Abdominal pain 09/26/2018  . Alcohol dependence with uncomplicated withdrawal (HCC) 05/16/2018    Consultants None  Imaging: Ct Abdomen Pelvis W Contrast  Result Date: 09/26/2018 CLINICAL DATA:  Left lower quadrant pain. EXAM: CT ABDOMEN AND PELVIS WITH CONTRAST TECHNIQUE: Multidetector CT imaging of the abdomen and pelvis was performed using the standard protocol following bolus administration of intravenous contrast. CONTRAST:  100mL OMNIPAQUE IOHEXOL 300 MG/ML  SOLN COMPARISON:  08/08/2018 FINDINGS: Lower chest: Normal. Hepatobiliary: Diffuse slight hepatic steatosis. Cholecystectomy. Chronic dilatation of the common hepatic and common bile duct. Pancreas: Unremarkable. No pancreatic ductal dilatation or surrounding inflammatory changes. Spleen: Normal in size without focal abnormality. Adrenals/Urinary Tract: Adrenal glands are normal. Stable 7 mm cyst in the upper pole of the left kidney. The kidneys are otherwise normal. Bladder is normal. Stomach/Bowel: Findings of acute diverticulitis noted in the left lower quadrant on the prior study have resolved. There are multiple diverticula in the sigmoid portion of the colon. There is a tiny amount of residual fluid in the pelvic cul-de-sac,, improved. The stomach and small bowel appear normal including the terminal ileum. The appendix is slightly more prominent than on the prior study and slightly enhances but there is no periappendiceal inflammation. Vascular/Lymphatic: No significant vascular findings are present. No enlarged abdominal or pelvic lymph nodes. Reproductive: Status post hysterectomy. No adnexal masses. Other: No abdominal wall hernia. No free air.  Reduced tiny amount of free fluid in the pelvis. Musculoskeletal: No acute or significant osseous findings. IMPRESSION: 1. Resolution of diverticulitis in proximal sigmoid portion of the colon. 2. Minimal new prominence of the appendix with slight enhancement of the mucosa but without periappendiceal inflammation. 3. Mild chronic hepatic steatosis. Electronically Signed   By: Francene BoyersJames  Maxwell M.D.   On: 09/26/2018 15:17    Procedures None   Hospital Course:  Patient is a 62 year old female recently treated as an outpatient for diverticulitis who [facility].  Workup showed possible inflammation of appendix but patient's history and exam were not consistent with appendicitis.  Patient was admitted for observation and still not felt to have acute appendicitis, rather some residual pain and inflammation from diverticulitis.  Treated with antibiotics.  Diet was advanced as tolerated.  On 09/28/18, the patient was voiding well, tolerating diet, ambulating well, pain well controlled, vital signs stable and felt stable for discharge home.  Patient will follow up in our office as needed and knows to call with questions or concerns. She will call to set up an appointment after she is scheduled for a colonoscopy.   Physical Exam: Gen:  Alert, NAD, pleasant Card:  Regular rate and rhythm, pedal pulses 2+ BL Pulm:  Normal effort, clear to auscultation bilaterally Abd: Soft, non-tender, non-distended, bowel sounds present Skin: warm and dry, no rashes  Psych: A&Ox3   Allergies as of 09/28/2018      Reactions   Ketorolac Hives   Tramadol Hives      Medication List    TAKE these medications   calcium carbonate 500 MG chewable tablet Commonly known as:  TUMS - dosed in mg elemental calcium Chew 1 tablet by mouth as needed for indigestion or heartburn.   ciprofloxacin 500 MG tablet Commonly known as:  CIPRO Take 1 tablet (500 mg total) by mouth 2 (two) times daily for 14  days.   cyclobenzaprine 10 MG tablet Commonly known as:  FLEXERIL Take 1 tablet (10 mg total) by mouth 3 (three) times daily.   FLUoxetine 20 MG tablet Commonly known as:  PROZAC Take 1 tablet (20 mg total) by mouth at bedtime.   gabapentin 800 MG tablet Commonly known as:  NEURONTIN Take 1 tablet (800 mg total) by mouth 4 (four) times daily.   metroNIDAZOLE 500 MG tablet Commonly known as:  FLAGYL Take 1 tablet (500 mg total) by mouth 2 (two) times daily with a meal for 14 days. DO NOT CONSUME ALCOHOL WHILE TAKING THIS MEDICATION.   nicotine 21 mg/24hr patch Commonly known as:  NICODERM CQ - dosed in mg/24 hours Place 1 patch (21 mg total) onto the skin daily.   ondansetron 4 MG disintegrating tablet Commonly known as:  ZOFRAN-ODT Take 1 tablet (4 mg total) by mouth every 6 (six) hours as needed for nausea.   oxyCODONE 5 MG immediate release tablet Commonly known as:  Oxy IR/ROXICODONE Take 1 tablet (5 mg total) by mouth every 6 (six) hours as needed for moderate pain.   pantoprazole 20 MG tablet Commonly known as:  PROTONIX Take 1 tablet (20 mg total) by mouth daily.        Follow-up Information    [facility]. Call.   Specialty:  General Surgery Why:  Call and schedule to be seen by a colorectal surgeon after colonoscopy.  Contact information: [address] [place] [address] [phone]           Signed: Wells Guiles, [facility] 09/28/2018, 9:22 AM Pager: [phone] Consults: [phone] Mon-Fri 7:00 am-4:30 pm Sat-Sun 7:00 am-11:30 am

## 2018-10-11 ENCOUNTER — Emergency Department (HOSPITAL_COMMUNITY): Payer: Medicaid Other

## 2018-10-11 ENCOUNTER — Emergency Department (HOSPITAL_COMMUNITY)
Admission: EM | Admit: 2018-10-11 | Discharge: 2018-10-12 | Disposition: A | Discharge status: Other Acute Inpatient Hospital | Payer: Medicaid Other | Attending: Emergency Medicine | Admitting: Emergency Medicine

## 2018-10-11 ENCOUNTER — Other Ambulatory Visit: Payer: Self-pay

## 2018-10-11 ENCOUNTER — Encounter (HOSPITAL_COMMUNITY): Payer: Self-pay

## 2018-10-11 DIAGNOSIS — Z79899 Other long term (current) drug therapy: Secondary | ICD-10-CM | POA: Insufficient documentation

## 2018-10-11 DIAGNOSIS — F332 Major depressive disorder, 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 psychotic features: Secondary | ICD-10-CM | POA: Diagnosis present

## 2018-10-11 DIAGNOSIS — R45851 Suicidal ideations: Secondary | ICD-10-CM | POA: Insufficient documentation

## 2018-10-11 DIAGNOSIS — F10230 Alcohol dependence with withdrawal, uncomplicated: Secondary | ICD-10-CM | POA: Diagnosis present

## 2018-10-11 DIAGNOSIS — F1020 Alcohol dependence, uncomplicated: Secondary | ICD-10-CM | POA: Diagnosis present

## 2018-10-11 DIAGNOSIS — F1010 Alcohol abuse, uncomplicated: Secondary | ICD-10-CM | POA: Insufficient documentation

## 2018-10-11 DIAGNOSIS — F17210 Nicotine dependence, cigarettes, uncomplicated: Secondary | ICD-10-CM | POA: Insufficient documentation

## 2018-10-11 NOTE — ED Notes (Signed)
[facility] Expected date:  Expected time:  Means of arrival:  Comments: EMS 62 yo female intoxicated and SI

## 2018-10-11 NOTE — ED Triage Notes (Addendum)
PT BIB EMS FROM A [facility]OTEL C/O SUICIDAL THOUGHTS, ETOH, AND PARANOIA.  PER EMS, PT STS SHE TOOK "SOMETHING THAT WAS LACED", THEN DENIED DRUG USE. PER PT, SHE IS DEPRESSED BECAUSE ALL OF HER FAMILY IS DECEASED. PT STS, "I'LL DRINK MYSELF TO DEATH." PT STS SHE HAS BEEN DRINKING ALL DAY, AS WELL.

## 2018-10-11 NOTE — ED Provider Notes (Signed)
[facility]-EMERGENCY DEPT Provider Note   CSN: [id number] Arrival date & time: 10/11/18  2228     History   Chief Complaint Chief Complaint  Patient presents with  . Suicidal    HPI [name] is a 62 y.o. female with a past medical history of alcohol abuse and dependence, alcohol withdrawal seizures, who presents today for evaluation of suicidal ideations.  She was reportedly brought in by EMS from a hotel.  Patient tells me that she drank "a lot" and that she drank so much she is forgotten how much she drank.  She tells me that she smoked something about 3 hours ago, she is unsure what it was however she is concerned it might of been laced.  She reports she smokes about 4 cigarettes a day.  She tells me that she wants to kill herself by cutting the veins in her arms.  She denies any HI.  She denies AVH.   She reports chronic, unchanged abdominal pain.      HPI  Past Medical History:  Diagnosis Date  . Alcohol dependence (HCC)   . Anxiety   . Chronic pain   . Depression   . Herniated cervical disc     Patient Active Problem List   Diagnosis Date Noted  . Abdominal pain 09/26/2018  . Alcohol dependence with uncomplicated withdrawal (HCC) 05/16/2018    Past Surgical History:  Procedure Laterality Date  . ABDOMINAL HYSTERECTOMY    . ANKLE SURGERY Right   . CARPAL TUNNEL RELEASE Bilateral   . CERVICAL FUSION    . CHOLECYSTECTOMY    . KNEE SURGERY       OB History   No obstetric history on file.      Home Medications    Prior to Admission medications   Medication Sig Start Date End Date Taking? Authorizing Provider  calcium carbonate (TUMS - DOSED IN MG ELEMENTAL CALCIUM) 500 MG chewable tablet Chew 1 tablet by mouth as needed for indigestion or heartburn.    [provider]  ciprofloxacin (CIPRO) 500 MG tablet Take 1 tablet (500 mg total) by mouth 2 (two) times daily for 14 days. 09/28/18 10/12/18  Rayburn, Alphonsus SiasKelly A, PA-C    cyclobenzaprine (FLEXERIL) 10 MG tablet Take 1 tablet (10 mg total) by mouth 3 (three) times daily. 08/05/18   Dahlia ByesBast, Traci A, NP  FLUoxetine (PROZAC) 20 MG tablet Take 1 tablet (20 mg total) by mouth at bedtime. 09/28/18   Rayburn, Alphonsus SiasKelly A, PA-C  gabapentin (NEURONTIN) 800 MG tablet Take 1 tablet (800 mg total) by mouth 4 (four) times daily. 09/28/18   Rayburn, Alphonsus SiasKelly A, PA-C  metroNIDAZOLE (FLAGYL) 500 MG tablet Take 1 tablet (500 mg total) by mouth 2 (two) times daily with a meal for 14 days. DO NOT CONSUME ALCOHOL WHILE TAKING THIS MEDICATION. 09/28/18 10/12/18  Rayburn, Alphonsus SiasKelly A, PA-C  nicotine (NICODERM CQ - DOSED IN MG/24 HOURS) 21 mg/24hr patch Place 1 patch (21 mg total) onto the skin daily. 09/28/18   Rayburn, Tresa EndoKelly A, PA-C  ondansetron (ZOFRAN-ODT) 4 MG disintegrating tablet Take 1 tablet (4 mg total) by mouth every 6 (six) hours as needed for nausea. 09/28/18   Rayburn, Alphonsus SiasKelly A, PA-C  oxyCODONE (OXY IR/ROXICODONE) 5 MG immediate release tablet Take 1 tablet (5 mg total) by mouth every 6 (six) hours as needed for moderate pain. 09/28/18   Rayburn, Alphonsus SiasKelly A, PA-C  pantoprazole (PROTONIX) 20 MG tablet Take 1 tablet (20 mg total) by mouth daily.  08/05/18   Janace ArisBast, Traci A, NP    Family History History reviewed. No pertinent family history.  Social History Social History   Tobacco Use  . Smoking status: Current Every Day Smoker    Packs/day: 0.50    Years: 7.00    Pack years: 3.50    Types: Cigarettes  . Smokeless tobacco: Never Used  Substance Use Topics  . Alcohol use: Yes    Comment: " trying to quit "  . Drug use: Yes    Types: Marijuana     Allergies   Ketorolac and Tramadol   Review of Systems Review of Systems  Constitutional: Negative for chills and fever.  HENT: Negative for congestion.   Eyes: Negative for visual disturbance.  Respiratory: Negative for chest tightness and shortness of breath.   Cardiovascular: Negative for chest pain.  Gastrointestinal: Positive  for abdominal pain (Chronic, unchanged. ). Negative for constipation, diarrhea and nausea.  Musculoskeletal: Positive for neck pain (Chronic, unchanged). Negative for back pain.  Neurological: Negative for weakness and headaches.  Psychiatric/Behavioral: Positive for behavioral problems, dysphoric mood and suicidal ideas.  All other systems reviewed and are negative.    Physical Exam Updated Vital Signs BP 125/80 (BP Location: Right Arm)   Pulse 65   Temp (!) 97.5 F (36.4 C) (Oral)   Resp (!) 24   Ht 5\' 1"  (1.549 m)   Wt 56.7 kg   SpO2 99%   BMI 23.62 kg/m   Physical Exam Vitals signs and nursing note reviewed.  Constitutional:      Appearance: She is well-developed.     Comments: Appears disheveled.   HENT:     Head: Normocephalic and atraumatic.  Eyes:     Conjunctiva/sclera: Conjunctivae normal.  Neck:     Musculoskeletal: Normal range of motion and neck supple. No neck rigidity.  Cardiovascular:     Rate and Rhythm: Normal rate and regular rhythm.     Heart sounds: No murmur.  Pulmonary:     Effort: Pulmonary effort is normal. No respiratory distress.     Breath sounds: Normal breath sounds.  Abdominal:     General: Abdomen is flat. There is no distension.     Palpations: Abdomen is soft.     Tenderness: There is no abdominal tenderness.  Skin:    General: Skin is warm and dry.  Neurological:     Mental Status: She is alert.     Comments: Speech is slightly slurred.   Psychiatric:        Mood and Affect: Affect is tearful.        Speech: Speech is slurred.        Behavior: Behavior is agitated and withdrawn.        Thought Content: Thought content includes suicidal ideation. Thought content does not include homicidal ideation. Thought content includes suicidal plan. Thought content does not include homicidal plan.      ED Treatments / Results  Labs (all labs ordered are listed, but only abnormal results are displayed) Labs Reviewed  CBC WITH  DIFFERENTIAL/PLATELET - Abnormal; Notable for the following components:      Result Value   MCV 101.7 (*)    Neutro Abs 1.3 (*)    All other components within normal limits  RAPID URINE DRUG SCREEN, HOSP PERFORMED  COMPREHENSIVE METABOLIC PANEL  ETHANOL  ACETAMINOPHEN LEVEL  SALICYLATE LEVEL    EKG EKG Interpretation  Date/Time:  Tuesday October 11 2018 23:49:56 EST Ventricular Rate:  68 PR  Interval:    QRS Duration: 101 QT Interval:  446 QTC Calculation: 475 R Axis:   -41 Text Interpretation:  Sinus rhythm Left anterior fascicular block Abnormal R-wave progression, late transition No significant change was found Confirmed by Glynn Octave [phone]) on 10/11/2018 11:53:53 PM   Radiology Dg Chest 2 View  Result Date: 10/11/2018 CLINICAL DATA:  Chest pressure EXAM: CHEST - 2 VIEW COMPARISON:  05/15/2018 FINDINGS: Cardiac shadow is within normal limits. Lungs are well aerated bilaterally. No focal infiltrate or sizable effusion is seen. No acute bony abnormality noted. IMPRESSION: No active cardiopulmonary disease. Electronically Signed   By: Alcide Clever M.D.   On: 10/11/2018 23:24    Procedures Procedures (including critical care time)  Medications Ordered in ED Medications - No data to display   Initial Impression / Assessment and Plan / ED Course  I have reviewed the triage vital signs and the nursing notes.  Pertinent labs & imaging results that were available during my care of the patient were reviewed by me and considered in my medical decision making (see chart for details).    [name] presents today for evaluation of suicidal ideation.  She tells me that she is currently suicidal with a plan to cut the veins in her arms.  She tells me that she feels like she is alone, does not have any family left living and does not have any reason to live.  She also appears intoxicated, tells me that she drank so much that she forgot how much she had.  She denies any new  physical complaints or concerns.  She does have chronic abdominal pain which has been previously evaluated.  At shift change care was transferred to Sharilyn Sites PA-C  who will follow pending studies, re-evaulate and determine disposition.     Final Clinical Impressions(s) / ED Diagnoses   Final diagnoses:  None    ED Discharge Orders    None       Norman Clay 10/12/18 0043    Glynn Octave, MD 10/12/18 [phone]

## 2018-10-12 ENCOUNTER — Inpatient Hospital Stay (HOSPITAL_COMMUNITY)
Admission: AD | Admit: 2018-10-12 | Discharge: 2018-10-14 | DRG: 885 | Disposition: A | Discharge status: Home / Self Care | Payer: Medicaid Other | Source: Intra-hospital | Attending: Psychiatry | Admitting: Psychiatry

## 2018-10-12 ENCOUNTER — Encounter (HOSPITAL_COMMUNITY): Payer: Self-pay | Admitting: *Deleted

## 2018-10-12 ENCOUNTER — Other Ambulatory Visit: Payer: Self-pay

## 2018-10-12 DIAGNOSIS — F332 Major depressive disorder, 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 psychotic features: Secondary | ICD-10-CM | POA: Diagnosis present

## 2018-10-12 DIAGNOSIS — Z79899 Other long term (current) drug therapy: Secondary | ICD-10-CM | POA: Diagnosis not present

## 2018-10-12 DIAGNOSIS — F4310 Post-traumatic stress disorder, unspecified: Secondary | ICD-10-CM | POA: Diagnosis present

## 2018-10-12 DIAGNOSIS — F17210 Nicotine dependence, cigarettes, uncomplicated: Secondary | ICD-10-CM | POA: Diagnosis present

## 2018-10-12 DIAGNOSIS — E876 Hypokalemia: Secondary | ICD-10-CM | POA: Diagnosis present

## 2018-10-12 DIAGNOSIS — F1024 Alcohol dependence with alcohol-induced mood disorder: Secondary | ICD-10-CM | POA: Diagnosis present

## 2018-10-12 DIAGNOSIS — Z90710 Acquired absence of both cervix and uterus: Secondary | ICD-10-CM

## 2018-10-12 DIAGNOSIS — F419 Anxiety disorder, unspecified: Secondary | ICD-10-CM | POA: Diagnosis present

## 2018-10-12 DIAGNOSIS — K219 Gastro-esophageal reflux disease without esophagitis: Secondary | ICD-10-CM | POA: Diagnosis present

## 2018-10-12 DIAGNOSIS — G4700 Insomnia, unspecified: Secondary | ICD-10-CM | POA: Diagnosis present

## 2018-10-12 LAB — CBC WITH DIFFERENTIAL/PLATELET
Abs Immature Granulocytes: 0.01 10*3/uL (ref 0.00–0.07)
Basophils Absolute: 0.1 10*3/uL (ref 0.0–0.1)
Basophils Relative: 1 %
EOS PCT: 3 %
Eosinophils Absolute: 0.2 10*3/uL (ref 0.0–0.5)
HCT: 41.1 % (ref 36.0–46.0)
HEMOGLOBIN: 13.4 g/dL (ref 12.0–15.0)
Immature Granulocytes: 0 %
Lymphocytes Relative: 63 %
Lymphs Abs: 3 10*3/uL (ref 0.7–4.0)
MCH: 33.2 pg (ref 26.0–34.0)
MCHC: 32.6 g/dL (ref 30.0–36.0)
MCV: 101.7 fL — ABNORMAL HIGH (ref 80.0–100.0)
Monocytes Absolute: 0.3 10*3/uL (ref 0.1–1.0)
Monocytes Relative: 5 %
Neutro Abs: 1.3 10*3/uL — ABNORMAL LOW (ref 1.7–7.7)
Neutrophils Relative %: 28 %
Platelets: 296 10*3/uL (ref 150–400)
RBC: 4.04 MIL/uL (ref 3.87–5.11)
RDW: 13.7 % (ref 11.5–15.5)
WBC: 4.8 10*3/uL (ref 4.0–10.5)
nRBC: 0 % (ref 0.0–0.2)

## 2018-10-12 LAB — ACETAMINOPHEN LEVEL

## 2018-10-12 LAB — COMPREHENSIVE METABOLIC PANEL
ALT: 15 U/L (ref 0–44)
AST: 26 U/L (ref 15–41)
Albumin: 4.1 g/dL (ref 3.5–5.0)
Alkaline Phosphatase: 43 U/L (ref 38–126)
Anion gap: 13 (ref 5–15)
BUN: 6 mg/dL — AB (ref 8–23)
CO2: 27 mmol/L (ref 22–32)
Calcium: 9.1 mg/dL (ref 8.9–10.3)
Chloride: 108 mmol/L (ref 98–111)
Creatinine, Ser: 0.55 mg/dL (ref 0.44–1.00)
GFR calc Af Amer: >60 mL/min (ref >60)
GFR calc non Af Amer: >60 mL/min (ref >60)
Glucose, Bld: 88 mg/dL (ref 70–99)
Potassium: 2.7 mmol/L — CL (ref 3.5–5.1)
Sodium: 148 mmol/L — ABNORMAL HIGH (ref 135–145)
Total Bilirubin: 0.5 mg/dL (ref 0.3–1.2)
Total Protein: 6.9 g/dL (ref 6.5–8.1)

## 2018-10-12 LAB — RAPID URINE DRUG SCREEN, HOSP PERFORMED
Amphetamines: NOT DETECTED
Barbiturates: NOT DETECTED
Benzodiazepines: NOT DETECTED
Cocaine: NOT DETECTED
Opiates: NOT DETECTED
Tetrahydrocannabinol: NOT DETECTED

## 2018-10-12 LAB — SALICYLATE LEVEL: Salicylate Lvl: <7 mg/dL (ref 2.8–30.0)

## 2018-10-12 LAB — ETHANOL: Alcohol, Ethyl (B): 346 mg/dL (ref <10)

## 2018-10-12 MED ORDER — GABAPENTIN 800 MG PO TABS
800.0000 mg | ORAL_TABLET | Freq: Four times a day (QID) | ORAL | Status: DC
  Filled 2018-10-12 (×3): qty 1

## 2018-10-12 MED ORDER — LORAZEPAM 1 MG PO TABS: 0.0000 mg | ORAL_TABLET | Freq: Two times a day (BID) | ORAL | Status: DC

## 2018-10-12 MED ORDER — VITAMIN B-1 100 MG PO TABS
100.0000 mg | ORAL_TABLET | Freq: Every day | ORAL | Status: DC
  Administered 2018-10-13 – 2018-10-14 (×2): 100 mg via ORAL
  Filled 2018-10-12 (×4): qty 1

## 2018-10-12 MED ORDER — FLUOXETINE HCL 20 MG PO CAPS
20.0000 mg | ORAL_CAPSULE | Freq: Every day | ORAL | Status: DC
  Filled 2018-10-12: qty 1

## 2018-10-12 MED ORDER — LORAZEPAM 2 MG/ML IJ SOLN: 0.0000 mg | Freq: Two times a day (BID) | INTRAMUSCULAR | Status: DC

## 2018-10-12 MED ORDER — ACETAMINOPHEN 325 MG PO TABS
650.0000 mg | ORAL_TABLET | Freq: Four times a day (QID) | ORAL | Status: DC | PRN
  Administered 2018-10-12: 650 mg via ORAL
  Filled 2018-10-12: qty 2

## 2018-10-12 MED ORDER — GABAPENTIN 300 MG PO CAPS
300.0000 mg | ORAL_CAPSULE | Freq: Three times a day (TID) | ORAL | Status: DC
  Administered 2018-10-12 – 2018-10-13 (×2): 300 mg via ORAL
  Filled 2018-10-12 (×6): qty 1

## 2018-10-12 MED ORDER — LORAZEPAM 1 MG PO TABS
1.0000 mg | ORAL_TABLET | Freq: Three times a day (TID) | ORAL | Status: AC
  Administered 2018-10-13 (×3): 1 mg via ORAL
  Filled 2018-10-12 (×3): qty 1

## 2018-10-12 MED ORDER — THIAMINE HCL 100 MG/ML IJ SOLN: 100.0000 mg | Freq: Every day | INTRAMUSCULAR | Status: DC

## 2018-10-12 MED ORDER — FOLIC ACID 1 MG PO TABS
1.0000 mg | ORAL_TABLET | Freq: Every day | ORAL | Status: DC
  Administered 2018-10-13 – 2018-10-14 (×2): 1 mg via ORAL
  Filled 2018-10-12 (×4): qty 1

## 2018-10-12 MED ORDER — PANTOPRAZOLE SODIUM 20 MG PO TBEC
20.0000 mg | DELAYED_RELEASE_TABLET | Freq: Every day | ORAL | Status: DC
  Administered 2018-10-13 – 2018-10-14 (×2): 20 mg via ORAL
  Filled 2018-10-12 (×2): qty 1
  Filled 2018-10-12: qty 7
  Filled 2018-10-12 (×2): qty 1

## 2018-10-12 MED ORDER — HYDROXYZINE HCL 25 MG PO TABS
25.0000 mg | ORAL_TABLET | Freq: Four times a day (QID) | ORAL | Status: DC | PRN
  Administered 2018-10-13 (×2): 25 mg via ORAL
  Filled 2018-10-12: qty 1
  Filled 2018-10-12: qty 10
  Filled 2018-10-12: qty 1

## 2018-10-12 MED ORDER — LOPERAMIDE HCL 2 MG PO CAPS: 2.0000 mg | ORAL_CAPSULE | ORAL | Status: DC | PRN

## 2018-10-12 MED ORDER — LORAZEPAM 2 MG/ML IJ SOLN: 1.0000 mg | Freq: Four times a day (QID) | INTRAMUSCULAR | Status: DC | PRN

## 2018-10-12 MED ORDER — POTASSIUM CHLORIDE CRYS ER 20 MEQ PO TBCR
40.0000 meq | EXTENDED_RELEASE_TABLET | Freq: Once | ORAL | Status: AC
  Administered 2018-10-12: 40 meq via ORAL
  Filled 2018-10-12: qty 2

## 2018-10-12 MED ORDER — DULOXETINE HCL 30 MG PO CPEP
30.0000 mg | ORAL_CAPSULE | Freq: Every day | ORAL | Status: DC
  Administered 2018-10-13 – 2018-10-14 (×2): 30 mg via ORAL
  Filled 2018-10-12 (×4): qty 1
  Filled 2018-10-12: qty 7

## 2018-10-12 MED ORDER — DICYCLOMINE HCL 10 MG PO CAPS
10.0000 mg | ORAL_CAPSULE | Freq: Once | ORAL | Status: AC
  Administered 2018-10-12: 10 mg via ORAL
  Filled 2018-10-12: qty 1

## 2018-10-12 MED ORDER — ADULT MULTIVITAMIN W/MINERALS CH
1.0000 | ORAL_TABLET | Freq: Every day | ORAL | Status: DC
  Filled 2018-10-12: qty 1

## 2018-10-12 MED ORDER — ONDANSETRON 4 MG PO TBDP: 4.0000 mg | ORAL_TABLET | Freq: Four times a day (QID) | ORAL | Status: DC | PRN

## 2018-10-12 MED ORDER — VITAMIN B-1 100 MG PO TABS: 100.0000 mg | ORAL_TABLET | Freq: Every day | ORAL | Status: DC

## 2018-10-12 MED ORDER — LORAZEPAM 1 MG PO TABS
0.0000 mg | ORAL_TABLET | Freq: Four times a day (QID) | ORAL | Status: DC
  Administered 2018-10-12: 1 mg via ORAL
  Filled 2018-10-12: qty 1

## 2018-10-12 MED ORDER — ACETAMINOPHEN 325 MG PO TABS
650.0000 mg | ORAL_TABLET | Freq: Once | ORAL | Status: AC
  Administered 2018-10-12: 650 mg via ORAL
  Filled 2018-10-12: qty 2

## 2018-10-12 MED ORDER — ONDANSETRON 4 MG PO TBDP
4.0000 mg | ORAL_TABLET | Freq: Once | ORAL | Status: AC
  Administered 2018-10-12: 4 mg via ORAL
  Filled 2018-10-12: qty 1

## 2018-10-12 MED ORDER — VITAMIN B-1 100 MG PO TABS
100.0000 mg | ORAL_TABLET | Freq: Every day | ORAL | Status: DC
  Filled 2018-10-12: qty 1

## 2018-10-12 MED ORDER — THIAMINE HCL 100 MG/ML IJ SOLN: 100.0000 mg | Freq: Once | INTRAMUSCULAR | Status: DC

## 2018-10-12 MED ORDER — LORAZEPAM 1 MG PO TABS: 0.0000 mg | ORAL_TABLET | Freq: Four times a day (QID) | ORAL | Status: DC

## 2018-10-12 MED ORDER — FOLIC ACID 1 MG PO TABS
1.0000 mg | ORAL_TABLET | Freq: Every day | ORAL | Status: DC
  Administered 2018-10-12: 1 mg via ORAL
  Filled 2018-10-12: qty 1

## 2018-10-12 MED ORDER — LORAZEPAM 1 MG PO TABS
1.0000 mg | ORAL_TABLET | Freq: Four times a day (QID) | ORAL | Status: AC
  Administered 2018-10-12 (×3): 1 mg via ORAL
  Filled 2018-10-12 (×3): qty 1

## 2018-10-12 MED ORDER — NICOTINE 21 MG/24HR TD PT24
21.0000 mg | MEDICATED_PATCH | Freq: Every day | TRANSDERMAL | Status: DC
  Administered 2018-10-12 – 2018-10-14 (×3): 21 mg via TRANSDERMAL
  Filled 2018-10-12 (×6): qty 1

## 2018-10-12 MED ORDER — ADULT MULTIVITAMIN W/MINERALS CH
1.0000 | ORAL_TABLET | Freq: Every day | ORAL | Status: DC
  Administered 2018-10-12 – 2018-10-14 (×3): 1 via ORAL
  Filled 2018-10-12 (×6): qty 1

## 2018-10-12 MED ORDER — METRONIDAZOLE 500 MG PO TABS: 500.0000 mg | ORAL_TABLET | Freq: Two times a day (BID) | ORAL | Status: DC

## 2018-10-12 MED ORDER — VITAMIN B-1 100 MG PO TABS
100.0000 mg | ORAL_TABLET | Freq: Every day | ORAL | Status: DC
  Administered 2018-10-12: 100 mg via ORAL
  Filled 2018-10-12: qty 1

## 2018-10-12 MED ORDER — LORAZEPAM 1 MG PO TABS
1.0000 mg | ORAL_TABLET | Freq: Two times a day (BID) | ORAL | Status: DC
  Administered 2018-10-14: 1 mg via ORAL
  Filled 2018-10-12: qty 1

## 2018-10-12 MED ORDER — ONDANSETRON 4 MG PO TBDP
4.0000 mg | ORAL_TABLET | Freq: Four times a day (QID) | ORAL | Status: DC | PRN
  Administered 2018-10-12: 4 mg via ORAL
  Filled 2018-10-12: qty 1

## 2018-10-12 MED ORDER — GABAPENTIN 400 MG PO CAPS
800.0000 mg | ORAL_CAPSULE | Freq: Four times a day (QID) | ORAL | Status: DC
  Filled 2018-10-12: qty 2

## 2018-10-12 MED ORDER — LORAZEPAM 2 MG/ML IJ SOLN: 0.0000 mg | Freq: Four times a day (QID) | INTRAMUSCULAR | Status: DC

## 2018-10-12 MED ORDER — METRONIDAZOLE 500 MG PO TABS
500.0000 mg | ORAL_TABLET | Freq: Two times a day (BID) | ORAL | Status: DC
  Filled 2018-10-12 (×2): qty 1

## 2018-10-12 MED ORDER — MAGNESIUM HYDROXIDE 400 MG/5ML PO SUSP: 30.0000 mL | Freq: Every day | ORAL | Status: DC | PRN

## 2018-10-12 MED ORDER — LORAZEPAM 1 MG PO TABS: 1.0000 mg | ORAL_TABLET | Freq: Every day | ORAL | Status: DC

## 2018-10-12 MED ORDER — NICOTINE 21 MG/24HR TD PT24: 21.0000 mg | MEDICATED_PATCH | Freq: Every day | TRANSDERMAL | Status: DC

## 2018-10-12 MED ORDER — ADULT MULTIVITAMIN W/MINERALS CH
1.0000 | ORAL_TABLET | Freq: Every day | ORAL | Status: DC
  Administered 2018-10-12: 1 via ORAL
  Filled 2018-10-12: qty 1

## 2018-10-12 MED ORDER — FLUOXETINE HCL 20 MG PO CAPS: 20.0000 mg | ORAL_CAPSULE | Freq: Every day | ORAL | Status: DC

## 2018-10-12 MED ORDER — POTASSIUM CHLORIDE CRYS ER 20 MEQ PO TBCR
20.0000 meq | EXTENDED_RELEASE_TABLET | Freq: Two times a day (BID) | ORAL | Status: AC
  Administered 2018-10-12 – 2018-10-13 (×3): 20 meq via ORAL
  Filled 2018-10-12 (×5): qty 1

## 2018-10-12 MED ORDER — LOPERAMIDE HCL 2 MG PO CAPS
4.0000 mg | ORAL_CAPSULE | Freq: Once | ORAL | Status: AC
  Administered 2018-10-12: 4 mg via ORAL
  Filled 2018-10-12: qty 2

## 2018-10-12 MED ORDER — GABAPENTIN 800 MG PO TABS
800.0000 mg | ORAL_TABLET | Freq: Four times a day (QID) | ORAL | Status: DC
  Filled 2018-10-12 (×2): qty 1

## 2018-10-12 MED ORDER — PANTOPRAZOLE SODIUM 20 MG PO TBEC
20.0000 mg | DELAYED_RELEASE_TABLET | Freq: Every day | ORAL | Status: DC
  Administered 2018-10-12: 20 mg via ORAL
  Filled 2018-10-12: qty 1

## 2018-10-12 MED ORDER — ALUM & MAG HYDROXIDE-SIMETH 200-200-20 MG/5ML PO SUSP: 30.0000 mL | ORAL | Status: DC | PRN

## 2018-10-12 MED ORDER — LORAZEPAM 1 MG PO TABS: 1.0000 mg | ORAL_TABLET | Freq: Four times a day (QID) | ORAL | Status: DC | PRN

## 2018-10-12 MED ORDER — CIPROFLOXACIN HCL 500 MG PO TABS
500.0000 mg | ORAL_TABLET | Freq: Two times a day (BID) | ORAL | Status: DC
  Filled 2018-10-12 (×2): qty 1

## 2018-10-12 NOTE — ED Notes (Signed)
Bed: [facility]34 Expected date:  Expected time:  Means of arrival:  Comments: Room 11 
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
## 2018-10-12 NOTE — Tx Team (Signed)
Initial Treatment Plan 10/12/2018 2:16 PM [name] [id number]    PATIENT STRESSORS: Health problems Medication change or noncompliance Substance abuse   PATIENT STRENGTHS: Ability for insight Average or above average intelligence Capable of independent living General fund of knowledge Motivation for treatment/growth   PATIENT IDENTIFIED PROBLEMS: Depression Suicidal thoughts Alcohol abuse "I need to get setup with mental health" "Find some local AA meetings"                     DISCHARGE CRITERIA:  Ability to meet basic life and health needs Improved stabilization in mood, thinking, and/or behavior Reduction of life-threatening or endangering symptoms to within safe limits Verbal commitment to aftercare and medication compliance Withdrawal symptoms are absent or subacute and managed without 24-hour nursing intervention  PRELIMINARY DISCHARGE PLAN: Attend aftercare/continuing care group  PATIENT/FAMILY INVOLVEMENT: This treatment plan has been presented to and reviewed with the patient, Emily FlakeDarlene Gilday, and/or family member, .  The patient and family have been given the opportunity to ask questions and make suggestions.  Twinkle Sockwell, [place], [place]RN 10/12/2018, 2:16 PM

## 2018-10-12 NOTE — Progress Notes (Signed)
[name] is a 62 year old female pt admitted on voluntary basis. On admission, she reports that she has been feeling depressed, has been drinking heavily and does report that she has made some suicidal comments recently. She denies SI currently and is able to contract for safety while in the hospital. She reports that she was on anti-depressants and reports that she stopped them and increased her alcohol consumption after her mother and brother passed away. She reports [place] [facility] [place]. She is visibly tremulous on admission and does appear to be in withdrawal at this time. She reports that she lives in a hotel and reports that her plan is to be able to go back there once she is discharged. Shylah was escorted to the unit, oriented to the milieu and safety maintained.

## 2018-10-12 NOTE — ED Notes (Addendum)
Pt ambulatory w/o difficulty to [facility] with [facility], belongings given to driver. 

## 2018-10-12 NOTE — ED Notes (Signed)
Pt ambulatory w/o difficulty from ED to room 34

## 2018-10-12 NOTE — ED Notes (Signed)
Resting quietly, nad.  PT [facility], pt reports her stomach pain has improved with the protonix.

## 2018-10-12 NOTE — BH Assessment (Signed)
[facility]   10[facility]12/18: [facility] patient has been [facility] to [facility]1 at 1200 today.

## 2018-10-12 NOTE — ED Notes (Signed)
Tom CSW into see 

## 2018-10-12 NOTE — ED Notes (Signed)
[facility] at 1300 [facility]

## 2018-10-12 NOTE — ED Notes (Signed)
Up to the bathroom 

## 2018-10-12 NOTE — ED Notes (Signed)
Pt alert/oriented, denies si/hi/avh at this time.  Pt reports that she lives alone and most of her family is deceased.  Pt reports that she has been drinking heavily since last year after the death of her brother and mother.  [facility]  Pt reports chronic stomach pain, neck/back pain that she takes neurotin and protonix for with some relief.  Pt states that she has been drinking about a fifth every 2 days recently.  Pt also reports hx of depression and has been in treatment previously.  Pt reports that the medications did help, but she has been off of them for several years.  Pt also reports that she has been in treatment for ETOH before (many yrs ago) and at on point she was able to stay sober for 2 yrs.  Pt also reports history of dt/withdrawal seizures. NAD procedures explained, oriented to unit.

## 2018-10-12 NOTE — ED Notes (Signed)
[facility] contacted for transport 
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
## 2018-10-12 NOTE — B[facility] Counselor (Signed)
Clinician attempted to contact's RN to discuss pt's disposiotin however the phone continued ringing.   Redmond Pullingreylese D Trajon Rosete, MS, [facility], [facility] Triage Specialist [phone]

## 2018-10-12 NOTE — ED Notes (Signed)
Up the bathroom  

## 2018-10-12 NOTE — Progress Notes (Signed)
Patient ID: Vilinda FlakeDarlene Vice, female   DOB: [birth year], 62 y.o.   MRN: [id number] DAR Note: Pt observed in be resting with eyes closed; "I have not been able to sleep since yesterday; just a little tired." Pt denied any anxiety, depression, SI, HI or pain at the time of assessment; "I feel much better today." Pt was med compliant. All patient's questions and concerns addressed. Support, encouragement, and safe environment provided. Will continue to monitor for any changes. 15-minute safety checks continue.

## 2018-10-12 NOTE — ED Provider Notes (Addendum)
Assumed care from PA BartleyHammond at shift change.  See prior notes for full H&P.  Briefly, 62 y.o. F here with suicidal ideation.  She is currently intoxicated, forgot how much she drank.  States she wants to kill herself by cutting.  Seems depressed about being alone for the holidays. Labs with ethanol of 346.  Potassium low at 2.7-- was given PO supplementation already.  Plan:  TTS eval pending.    Results for orders placed or performed during the hospital encounter of 10/11/18  Comprehensive metabolic panel  Result Value Ref Range   Sodium 148 (H) 135 - 145 mmol/L   Potassium 2.7 (LL) 3.5 - 5.1 mmol/L   Chloride 108 98 - 111 mmol/L   CO2 27 22 - 32 mmol/L   Glucose, Bld 88 70 - 99 mg/dL   BUN 6 (L) 8 - 23 mg/dL   Creatinine, Ser 1.610.55 0.44 - 1.00 mg/dL   Calcium 9.1 8.9 - 09.610.3 mg/dL   Total Protein 6.9 6.5 - 8.1 g/dL   Albumin 4.1 3.5 - 5.0 g/dL   AST 26 15 - 41 U/L   ALT 15 0 - 44 U/L   Alkaline Phosphatase 43 38 - 126 U/L   Total Bilirubin 0.5 0.3 - 1.2 mg/dL   GFR calc non Af Amer >60 >60 mL/min   GFR calc Af Amer >60 >60 mL/min   Anion gap 13 5 - 15  Ethanol  Result Value Ref Range   Alcohol, Ethyl (B) 346 (HH) <10 mg/dL  Urine rapid drug screen (hosp performed)  Result Value Ref Range   Opiates NONE DETECTED NONE DETECTED   Cocaine NONE DETECTED NONE DETECTED   Benzodiazepines NONE DETECTED NONE DETECTED   Amphetamines NONE DETECTED NONE DETECTED   Tetrahydrocannabinol NONE DETECTED NONE DETECTED   Barbiturates NONE DETECTED NONE DETECTED  CBC with Diff  Result Value Ref Range   WBC 4.8 4.0 - 10.5 K/uL   RBC 4.04 3.87 - 5.11 MIL/uL   Hemoglobin 13.4 12.0 - 15.0 g/dL   HCT 04.541.1 40.936.0 - 81.146.0 %   MCV 101.7 (H) 80.0 - 100.0 fL   MCH 33.2 26.0 - 34.0 pg   MCHC 32.6 30.0 - 36.0 g/dL   RDW 91.413.7 78.211.5 - 95.615.5 %   Platelets 296 150 - 400 K/uL   nRBC 0.0 0.0 - 0.2 %   Neutrophils Relative % 28 %   Neutro Abs 1.3 (L) 1.7 - 7.7 K/uL   Lymphocytes Relative 63 %   Lymphs Abs 3.0  0.7 - 4.0 K/uL   Monocytes Relative 5 %   Monocytes Absolute 0.3 0.1 - 1.0 K/uL   Eosinophils Relative 3 %   Eosinophils Absolute 0.2 0.0 - 0.5 K/uL   Basophils Relative 1 %   Basophils Absolute 0.1 0.0 - 0.1 K/uL   Immature Granulocytes 0 %   Abs Immature Granulocytes 0.01 0.00 - 0.07 K/uL  Acetaminophen level  Result Value Ref Range   Acetaminophen (Tylenol), Serum <10 (L) 10 - 30 ug/mL  Salicylate level  Result Value Ref Range   Salicylate Lvl <7.0 2.8 - 30.0 mg/dL   Dg Chest 2 View  Result Date: 10/11/2018 CLINICAL DATA:  Chest pressure EXAM: CHEST - 2 VIEW COMPARISON:  05/15/2018 FINDINGS: Cardiac shadow is within normal limits. Lungs are well aerated bilaterally. No focal infiltrate or sizable effusion is seen. No acute bony abnormality noted. IMPRESSION: No active cardiopulmonary disease. Electronically Signed   By: Alcide CleverMark  Lukens M.D.   On: 10/11/2018  23:24    TTS has evaluated, recommends overnight observation and reassessment by psychiatry once completely sober.  Oncoming team made aware and will follow-up on psych recommendations.   Garlon HatchetSanders, Khia Dieterich M, PA-C 10/12/18 0530    Garlon HatchetSanders, Lakesha Levinson M, PA-C 10/12/18 [id number]    Glynn Octaveancour, Stephen, MD 10/12/18 [phone]

## 2018-10-12 NOTE — ED Notes (Signed)
Report called to Marchel[facility]manda [facility] [place] [facility]

## 2018-10-12 NOTE — BH Assessment (Signed)
[facility]  Per Emily BeetsJacqueline Norman, DO, this pt requires psychiatric hospitalization at this time.  Emily DageShaleta Forrest, RN, [facility]; [facility] will be ready to receive pt at 12:00.  Pt has signed Voluntary Admission and Consent for Treatment, as well as Consent to Release Information to no one, and signed [facility].  Pt's nurse, Emily CelesteJanie, has been notified, and agrees to send original paperwork along with pt via [name], and to call report to [phone].  [name], [place] Behavioral Health Coordinator [phone]

## 2018-10-12 NOTE — ED Notes (Signed)
Pt is aware of delay in transport

## 2018-10-13 LAB — BASIC METABOLIC PANEL
ANION GAP: 12 (ref 5–15)
BUN: 7 mg/dL — ABNORMAL LOW (ref 8–23)
CO2: 23 mmol/L (ref 22–32)
Calcium: 9.3 mg/dL (ref 8.9–10.3)
Chloride: 105 mmol/L (ref 98–111)
Creatinine, Ser: 0.66 mg/dL (ref 0.44–1.00)
GFR calc non Af Amer: >60 mL/min (ref >60)
Glucose, Bld: 111 mg/dL — ABNORMAL HIGH (ref 70–99)
Potassium: 3.4 mmol/L — ABNORMAL LOW (ref 3.5–5.1)
Sodium: 140 mmol/L (ref 135–145)

## 2018-10-13 LAB — TSH: TSH: 5.187 u[IU]/mL — ABNORMAL HIGH (ref 0.350–4.500)

## 2018-10-13 MED ORDER — GABAPENTIN 400 MG PO CAPS
400.0000 mg | ORAL_CAPSULE | Freq: Three times a day (TID) | ORAL | Status: DC
  Administered 2018-10-13 – 2018-10-14 (×4): 400 mg via ORAL
  Filled 2018-10-13: qty 21
  Filled 2018-10-13 (×5): qty 1
  Filled 2018-10-13: qty 21
  Filled 2018-10-13 (×3): qty 1
  Filled 2018-10-13: qty 21
  Filled 2018-10-13 (×2): qty 1

## 2018-10-13 NOTE — Progress Notes (Addendum)
Patient ID: [name], female   DOB: 08/23/1956, 62 y.o.   MRN: [id number]  Nursing Progress Note [id number]  Data: On initial approach, patient was seen up in the dayroom. Patient presents with flat affect and depressed mood. Patient compliant with scheduled medications. Patient does endorse on-going chronic pain from the previous surgeries to her neck. Patient does have mild withdrawal symptoms, detox protocol in place. Patient completed self-inventory sheet and rates depression, hopelessness, and anxiety 5,0,5 respectively. Patient rates their sleep and appetite as good/fair respectively. Patient states goal for today is to "feel more positive, stay open-minded". Patient is seen attending groups and visible in the milieu. Patient currently denies SI/HI/AVH.   Action: Patient is educated about and provided medication per provider's orders. Patient safety maintained with q15 min safety checks and frequent rounding. High fall risk precautions in place. Emotional support given. 1:1 interaction and active listening provided. Patient encouraged to attend meals, groups, and work on treatment plan and goals. Labs, vital signs and patient behavior monitored throughout shift.   Response: Patient remains safe on the unit at this time and agrees to come to staff with any issues/concerns. Patient is interacting with peers appropriately on the unit. Will continue to support and monitor.

## 2018-10-13 NOTE — [facility] Suicide Risk Assessment (Signed)
[facility] INPATIENT:  Family/Significant Other Suicide Prevention Education  Suicide Prevention Education:  Patient Refusal for Family/Significant Other Suicide Prevention Education: The patient Emily FlakeDarlene Tietje has refused to provide written consent for family/significant other to be provided Family/Significant Other Suicide Prevention Education during admission and/or prior to discharge.  Physician notified.  SPE completed with pt, as pt refused to consent to family contact. SPI pamphlet provided to pt and pt was encouraged to share information with support network, ask questions, and talk about any concerns relating to SPE. Pt denies access to guns/firearms and verbalized understanding of information provided. Mobile Crisis information also provided to pt.   Rona RavensHeather S Bette Brienza LCSW 10/13/2018, 10:44 AM
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
## 2018-10-13 NOTE — Progress Notes (Addendum)
[facility] MD Progress Note  10/13/2018 10:15 AM [name]  MRN:  [id number] Subjective: Patient reports feeling better than she did on admission.  Denies suicidal ideations.  Currently focusing on discharge, expressing concern that if she does not leave within the next 2 days she might lose her hotel room where she resides. Denies significant withdrawal symptoms at this time.  Objective: I have discussed case with treatment team and have met with patient. 62 year old female, presented to ED voluntarily reporting daily , heavy alcohol consumption, with worsening ability to function , associated symptoms, frequent blackouts.Admission BAL 346. Reports depression , particularly after mother and brother passed away last year.  Patient is presenting with partially improved mood, more reactive affect, denies suicidal ideations.  Does endorse chronic sense of sadness following death of loved ones and expresses interest in grief therapy following discharge. She states she feels that alcohol withdrawal symptoms have improved/abated and currently is not in any acute distress or discomfort.  Presents calm, comfortable.  Denies visual disturbances, denies palpitations, denies diarrhea.  Distal tremors are minimal at this time, no diaphoresis is noted. Has been visible on unit, spending time in dayroom. States she slept better last night.  Does not endorse medication side effects- currently on Neurontin, Cymbalta, and on Ativan detox protocol. Labs reviewed-potassium improved to 3.4. TSH mildly elevated at 5.187 Principal Problem:  Alcohol Use Disorder, Alcohol Induced Mood Disorder versus MDD Diagnosis: Active Problems:   Major depressive disorder, 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 psychotic features ([place])  Total Time spent with patient: 20 minutes  Past Psychiatric History:   Past Medical History:  Past Medical History:  Diagnosis Date  . Alcohol dependence ([place])   . Anxiety   . Chronic pain   . Depression    . Herniated cervical disc     Past Surgical History:  Procedure Laterality Date  . ABDOMINAL HYSTERECTOMY    . ANKLE SURGERY Right   . CARPAL TUNNEL RELEASE Bilateral   . CERVICAL FUSION    . CHOLECYSTECTOMY    . KNEE SURGERY     Family History: History reviewed. No pertinent family history. Family Psychiatric  History: Social History:  Social History   Substance and Sexual Activity  Alcohol Use Yes   Comment: " trying to quit "     Social History   Substance and Sexual Activity  Drug Use Yes  . Types: Marijuana    Social History   Socioeconomic History  . Marital status: Single    Spouse name: Not on file  . Number of children: Not on file  . Years of education: Not on file  . Highest education level: Not on file  Occupational History  . Not on file  Social Needs  . Financial resource strain: Somewhat hard  . Food insecurity:    Worry: Sometimes true    Inability: Sometimes true  . Transportation needs:    Medical: No    Non-medical: No  Tobacco Use  . Smoking status: Current Every Day Smoker    Packs/day: 0.50    Years: 7.00    Pack years: 3.50    Types: Cigarettes  . Smokeless tobacco: Never Used  Substance and Sexual Activity  . Alcohol use: Yes    Comment: " trying to quit "  . Drug use: Yes    Types: Marijuana  . Sexual activity: Not Currently  Lifestyle  . Physical activity:    Days per week: Patient refused    Minutes per session: Patient  refused  . Stress: Only a little  Relationships  . Social connections:    Talks on phone: Patient refused    Gets together: Patient refused    Attends religious service: Patient refused    Active member of club or organization: Patient refused    Attends meetings of clubs or organizations: Patient refused    Relationship status: Patient refused  Other Topics Concern  . Not on file  Social History Narrative  . Not on file   Additional Social History:   Sleep: improving   Appetite:  improving    Current Medications: Current Facility-Administered Medications  Medication Dose Route Frequency Provider Last Rate Last Dose  . acetaminophen (TYLENOL) tablet 650 mg  650 mg Oral Q6H PRN Patrecia Pour, NP   650 mg at 10/12/18 1719  . alum & mag hydroxide-simeth (MAALOX/MYLANTA) 200-200-20 MG/5ML suspension 30 mL  30 mL Oral Q4H PRN Patrecia Pour, NP      . DULoxetine (CYMBALTA) DR capsule 30 mg  30 mg Oral Daily Chevonne Bostrom, Myer Peer, MD   30 mg at 10/13/18 0752  . folic acid (FOLVITE) tablet 1 mg  1 mg Oral Daily Patrecia Pour, NP   1 mg at 10/13/18 1017  . gabapentin (NEURONTIN) capsule 400 mg  400 mg Oral TID Serafin Decatur, Myer Peer, MD      . hydrOXYzine (ATARAX/VISTARIL) tablet 25 mg  25 mg Oral Q6H PRN Sharma Covert, MD   25 mg at 10/13/18 5102  . loperamide (IMODIUM) capsule 2-4 mg  2-4 mg Oral PRN Sharma Covert, MD      . LORazepam (ATIVAN) tablet 1 mg  1 mg Oral TID Sharma Covert, MD   1 mg at 10/13/18 0754   Followed by  . [START ON 10/14/2018] LORazepam (ATIVAN) tablet 1 mg  1 mg Oral BID Sharma Covert, MD       Followed by  . [START ON 10/15/2018] LORazepam (ATIVAN) tablet 1 mg  1 mg Oral Daily Clary, Cordie Grice, MD      . magnesium hydroxide (MILK OF MAGNESIA) suspension 30 mL  30 mL Oral Daily PRN Patrecia Pour, NP      . multivitamin with minerals tablet 1 tablet  1 tablet Oral Daily Sharma Covert, MD   1 tablet at 10/13/18 [phone]  . nicotine (NICODERM CQ - dosed in mg/24 hours) patch 21 mg  21 mg Transdermal Daily Jorryn Hershberger, Myer Peer, MD   21 mg at 10/13/18 0759  . ondansetron (ZOFRAN-ODT) disintegrating tablet 4 mg  4 mg Oral Q6H PRN Sharma Covert, MD      . pantoprazole (PROTONIX) EC tablet 20 mg  20 mg Oral Daily Patrecia Pour, NP   20 mg at 10/13/18 0752  . potassium chloride SA (K-DUR,KLOR-CON) CR tablet 20 mEq  20 mEq Oral BID Cleopatra Sardo, Myer Peer, MD   20 mEq at 10/13/18 0752  . thiamine (B-1) injection 100 mg  100 mg Intramuscular Once Sharma Covert, MD      . thiamine (VITAMIN B-1) tablet 100 mg  100 mg Oral Daily Sharma Covert, MD   100 mg at 10/13/18 7782    Lab Results:  Results for orders placed or performed during the hospital encounter of 10/12/18 (from the past 48 hour(s))  Basic metabolic panel     Status: Abnormal   Collection Time: 10/13/18  6:30 AM  Result Value Ref Range   Sodium 140 135 -  145 mmol/L   Potassium 3.4 (L) 3.5 - 5.1 mmol/L   Chloride 105 98 - 111 mmol/L   CO2 23 22 - 32 mmol/L   Glucose, Bld 111 (H) 70 - 99 mg/dL   BUN 7 (L) 8 - 23 mg/dL   Creatinine, Ser 0.66 0.44 - 1.00 mg/dL   Calcium 9.3 8.9 - 10.3 mg/dL   GFR calc non Af Amer >60 >60 mL/min   GFR calc Af Amer >60 >60 mL/min   Anion gap 12 5 - 15    Comment: Performed at [facility], [place] [address], [place], [place] [address]  TSH     Status: Abnormal   Collection Time: 10/13/18  6:30 AM  Result Value Ref Range   TSH 5.187 (H) 0.350 - 4.500 uIU/mL    Comment: Performed by a 3rd Generation assay with a functional sensitivity of <=0.01 uIU/mL. Performed at [facility], [place] [address] [place], [place] [address]     Blood Alcohol level:  Lab Results  Component Value Date   SKA 768 [facility]) 10/11/2018   ETH 435 (HH) 11/57/2620    Metabolic Disorder Labs: Lab Results  Component Value Date   HGBA1C  10/23/2009    5.4 (NOTE) The ADA recommends the following therapeutic goal for glycemic control related to Hgb A1c measurement: Goal of therapy: <6.5 Hgb A1c  Reference: American Diabetes Association: Clinical Practice Recommendations 2010, Diabetes Care, 2010, 33: (Suppl  1).   MPG 108 10/23/2009   No results found for: PROLACTIN Lab Results  Component Value Date   CHOL  10/23/2009    181        ATP III CLASSIFICATION:  <200     mg/dL   Desirable  200-239  mg/dL   Borderline High  >=240    mg/dL   High          TRIG 69 10/23/2009   HDL 88 10/23/2009   CHOLHDL 2.1 10/23/2009   VLDL  14 10/23/2009   LDLCALC  10/23/2009    79        Total Cholesterol/HDL:CHD Risk Coronary Heart Disease Risk Table                     Men   Women  1/2 Average Risk   3.4   3.3  Average Risk       5.0   4.4  2 X Average Risk   9.6   7.1  3 X Average Risk  23.4   11.0        Use the calculated Patient Ratio above and the CHD Risk Table to determine the patient's CHD Risk.        ATP III CLASSIFICATION (LDL):  <100     mg/dL   Optimal  100-129  mg/dL   Near or Above                    Optimal  130-159  mg/dL   Borderline  160-189  mg/dL   High  >190     mg/dL   Very High    Physical Findings: AIMS: Facial and Oral Movements Muscles of Facial Expression: None, normal Lips and Perioral Area: None, normal Jaw: None, normal Tongue: None, normal,Extremity Movements Upper (arms, wrists, hands, fingers): None, normal Lower (legs, knees, ankles, toes): None, normal, Trunk Movements Neck, shoulders, hips: None, normal, Overall Severity Severity of abnormal movements (highest score from questions above): None, normal Incapacitation due  to abnormal movements: None, normal Patient's awareness of abnormal movements (rate only patient's report): No Awareness, Dental Status Current problems with teeth and/or dentures?: No Does patient usually wear dentures?: No  CIWA:  CIWA-Ar Total: 5 COWS:     Musculoskeletal: Strength & Muscle Tone: within normal limits mild distal tremors, no diaphoresis, no psychomotor agitation Gait & Station: normal Patient leans: N/A  Psychiatric Specialty Exam: Physical Exam  ROS does not endorse headache or visual disturbances, no chest pain, no shortness of breath, no diarrhea, no vomiting  Blood pressure (!) 164/92, pulse 96, temperature 98.1 F (36.7 C), temperature source Oral, resp. rate 20, height 5' 1"  (1.549 m), weight 64.9 kg.Body mass index is 27.02 kg/m.  General Appearance: Fairly Groomed  Eye Contact:  Good  Speech:  Normal Rate  Volume:   Normal  Mood:  Reports improving mood  Affect:  More reactive today, smiles briefly at times  Thought Process:  Linear and Descriptions of Associations: Intact  Orientation:  Other:  Fully alert and attentive  Thought Content:  No hallucinations, no delusions, not internally preoccupied  Suicidal Thoughts:  No-at this time denies suicidal or self-injurious ideations, contracts for safety on unit, denies homicidal or violent ideations  Homicidal Thoughts:  No  Memory:  Recent and remote grossly intact  Judgement:  Other:  improving  Insight:  improving   Psychomotor Activity:  Normal  Concentration:  Concentration: Good and Attention Span: Good  Recall:  Good  Fund of Knowledge:  Good  Language:  Good  Akathisia:  Negative  Handed:  Right  AIMS (if indicated):     Assets:  Communication Skills Desire for Improvement Resilience  ADL's:  Intact  Cognition:  WNL  Sleep:  Number of Hours: 6.75   Assessment -  62 year old female, presented to ED voluntarily reporting daily , heavy alcohol consumption, with worsening ability to function , associated symptoms, frequent blackouts.Admission BAL 346. Reports depression , particularly after mother and brother passed away last year.  Patient is reporting improving mood, affect presents reactive, denies suicidal ideations at this time and presents future oriented, focusing on being discharged soon in order not to lose her hotel room where she has been residing.  Currently not presenting with significant withdrawal symptoms and appears calm, comfortable.  Tolerating medications well thus far, denies sedation and presents fully alert and attentive. TSH mildly elevated.  Treatment Plan Summary: Daily contact with patient to assess and evaluate symptoms and progress in treatment, Medication management, Plan Inpatient treatment and Medications as below Encourage group and milieu participation to work on coping skills and symptom reduction Encourage  efforts to work on sobriety and relapse prevention Continue Ativan detox protocol to address alcohol withdrawal symptoms Increase Neurontin to 400 mg 3 times daily for anxiety, pain. Continue Cymbalta 30 mg daily for depression Completing potassium supplementation for hypokalemia Continue Protonix for gastric protection/GERD symptoms Treatment team working on disposition planning options Check T3 and T4.   Jenne Campus, MD 10/13/2018, 10:15 AM

## 2018-10-14 DIAGNOSIS — F1024 Alcohol dependence with alcohol-induced mood disorder: Secondary | ICD-10-CM

## 2018-10-14 LAB — T4, FREE: Free T4: 0.74 ng/dL — ABNORMAL LOW (ref 0.82–1.77)

## 2018-10-14 MED ORDER — METOPROLOL SUCCINATE ER 25 MG PO TB24
25.0000 mg | ORAL_TABLET | Freq: Every day | ORAL | Status: DC
  Administered 2018-10-14: 25 mg via ORAL
  Filled 2018-10-14 (×3): qty 1

## 2018-10-14 MED ORDER — PANTOPRAZOLE SODIUM 20 MG PO TBEC: 20.0000 mg | DELAYED_RELEASE_TABLET | Freq: Every day | ORAL | 1 refills | Status: DC

## 2018-10-14 MED ORDER — METOPROLOL SUCCINATE ER 25 MG PO TB24: 25.0000 mg | ORAL_TABLET | Freq: Every day | ORAL | 0 refills | Status: DC

## 2018-10-14 MED ORDER — HYDROXYZINE HCL 25 MG PO TABS: 25.0000 mg | ORAL_TABLET | Freq: Four times a day (QID) | ORAL | 0 refills | Status: DC | PRN

## 2018-10-14 MED ORDER — DULOXETINE HCL 30 MG PO CPEP: 30.0000 mg | ORAL_CAPSULE | Freq: Every day | ORAL | 0 refills | Status: DC

## 2018-10-14 MED ORDER — NICOTINE 21 MG/24HR TD PT24: 21.0000 mg | MEDICATED_PATCH | Freq: Every day | TRANSDERMAL | 0 refills | Status: DC

## 2018-10-14 MED ORDER — GABAPENTIN 400 MG PO CAPS: 400.0000 mg | ORAL_CAPSULE | Freq: Three times a day (TID) | ORAL | 0 refills | Status: DC

## 2018-10-14 NOTE — Progress Notes (Signed)
D: Patient observed resting in bed at the start of shift. Promptly participated in assessment. States withdrawal is manageable and improving with the doses of ativan. Currently scored at a "5." BP remains elevated as has been the trend during her admission. Patient states she hopes to discharge tomorrow. "I have some family things to take care of. The staff is aware. I need to get home." Patient's affect anxious, flat with depressed and anxious mood. Patient is pleasant and cooperative.  Denies pain.   A: Medicated per orders, prn vistaril given for anxiety. Medication education provided. Level III obs in place for safety. Emotional support offered. Patient encouraged to complete Suicide Safety Plan before discharge. Encouraged to attend and participate in unit programming.  Fall prevention plan in place and reviewed with patient as pt is a high fall risk due to detox protocol.   R: Patient verbalizes understanding of POC, falls prevention education. On reassess, patient is asleep. Patient denies SI/HI/AVH and remains safe on level III obs. Will continue to monitor throughout the night.

## 2018-10-14 NOTE — Progress Notes (Signed)
Patient ID: [name], female   DOB: 12-Jul-1956, 62 y.o.   MRN: [id number]  Nursing Progress Note [id number]  Data: On initial approach, patient is seen up in the dayroom watching television. Patient presents with flat affect and anxious mood. Patient compliant with scheduled medications. Patient denies pain/physical complaints but does appear to have some withdrawal symptoms. Patient completed self-inventory sheet and rates depression, hopelessness, and anxiety 3,0,2 respectively. Patient rates their sleep and appetite as good/good respectively. Patient states goal for today is to "get released and go back to my motel off [address] Patient is seen attending groups and visible in the milieu. Patient currently denies SI/HI/AVH. Patient appears adamant about discharge this morning reporting, "I've already talked to everyone, my plan is in place".  Action: Patient is educated about and provided medication per provider's orders. Patient safety maintained with q15 min safety checks and frequent rounding. High fall risk precautions in place. Emotional support given. 1:1 interaction and active listening provided. Patient encouraged to attend meals, groups, and work on treatment plan and goals. Labs, vital signs and patient behavior monitored throughout shift.   Response: Patient remains safe on the unit at this time and agrees to come to staff with any issues/concerns. Patient is interacting with peers appropriately on the unit. Will continue to support and monitor.

## 2018-10-14 NOTE — Tx Team (Addendum)
Interdisciplinary Treatment and Diagnostic Plan Update  10/14/2018 Time of Session: [id number] Emily FlakeDarlene Harrison MRN: [id number]  Principal Diagnosis: MDD, recurrent, severe  Secondary Diagnoses: Active Problems:   Major depressive disorder, 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 psychotic features (HCC)   Current Medications:  Current Facility-Administered Medications  Medication Dose Route Frequency Provider Last Rate Last Dose  . acetaminophen (TYLENOL) tablet 650 mg  650 mg Oral Q6H PRN Charm RingsLord, Jamison Y, NP   650 mg at 10/12/18 1719  . alum & mag hydroxide-simeth (MAALOX/MYLANTA) 200-200-20 MG/5ML suspension 30 mL  30 mL Oral Q4H PRN Charm RingsLord, Jamison Y, NP      . DULoxetine (CYMBALTA) DR capsule 30 mg  30 mg Oral Daily Cobos, Rockey SituFernando A, MD   30 mg at 10/14/18 0754  . folic acid (FOLVITE) tablet 1 mg  1 mg Oral Daily Charm RingsLord, Jamison Y, NP   1 mg at 10/14/18 0754  . gabapentin (NEURONTIN) capsule 400 mg  400 mg Oral TID Cobos, Rockey SituFernando A, MD   400 mg at 10/14/18 0754  . hydrOXYzine (ATARAX/VISTARIL) tablet 25 mg  25 mg Oral Q6H PRN Antonieta Pertlary, Greg Lawson, MD   25 mg at 10/13/18 2015  . loperamide (IMODIUM) capsule 2-4 mg  2-4 mg Oral PRN Antonieta Pertlary, Greg Lawson, MD      . LORazepam (ATIVAN) tablet 1 mg  1 mg Oral BID Antonieta Pertlary, Greg Lawson, MD   1 mg at 10/14/18 0754   Followed by  . [START ON 10/15/2018] LORazepam (ATIVAN) tablet 1 mg  1 mg Oral Daily Clary, Marlane MingleGreg Lawson, MD      . magnesium hydroxide (MILK OF MAGNESIA) suspension 30 mL  30 mL Oral Daily PRN Charm RingsLord, Jamison Y, NP      . multivitamin with minerals tablet 1 tablet  1 tablet Oral Daily Antonieta Pertlary, Greg Lawson, MD   1 tablet at 10/14/18 0754  . nicotine (NICODERM CQ - dosed in mg/24 hours) patch 21 mg  21 mg Transdermal Daily Cobos, Rockey SituFernando A, MD   21 mg at 10/14/18 0755  . ondansetron (ZOFRAN-ODT) disintegrating tablet 4 mg  4 mg Oral Q6H PRN Antonieta Pertlary, Greg Lawson, MD      . pantoprazole (PROTONIX) EC tablet 20 mg  20 mg Oral Daily Charm RingsLord, Jamison Y, NP   20 mg at  10/14/18 0754  . thiamine (B-1) injection 100 mg  100 mg Intramuscular Once Antonieta Pertlary, Greg Lawson, MD      . thiamine (VITAMIN B-1) tablet 100 mg  100 mg Oral Daily Antonieta Pertlary, Greg Lawson, MD   100 mg at 10/14/18 [id number]   PTA Medications: Medications Prior to Admission  Medication Sig Dispense Refill Last Dose  . calcium carbonate (TUMS - DOSED IN MG ELEMENTAL CALCIUM) 500 MG chewable tablet Chew 1 tablet by mouth as needed for indigestion or heartburn.   Past Month at Unknown time  . [EXPIRED] ciprofloxacin (CIPRO) 500 MG tablet Take 1 tablet (500 mg total) by mouth 2 (two) times daily for 14 days. 28 tablet 0 Past Week at Unknown time  . cyclobenzaprine (FLEXERIL) 10 MG tablet Take 1 tablet (10 mg total) by mouth 3 (three) times daily. 20 tablet 1 Past Week at Unknown time  . FLUoxetine (PROZAC) 20 MG tablet Take 1 tablet (20 mg total) by mouth at bedtime. 30 tablet 0 Past Week at Unknown time  . gabapentin (NEURONTIN) 800 MG tablet Take 1 tablet (800 mg total) by mouth 4 (four) times daily. 60 tablet 0 10/11/2018 at Unknown time  . [EXPIRED] metroNIDAZOLE (  FLAGYL) 500 MG tablet Take 1 tablet (500 mg total) by mouth 2 (two) times daily with a meal for 14 days. DO NOT CONSUME ALCOHOL WHILE TAKING THIS MEDICATION. 28 tablet 0 Past Week at Unknown time  . nicotine (NICODERM CQ - DOSED IN MG/24 HOURS) 21 mg/24hr patch Place 1 patch (21 mg total) onto the skin daily. 28 patch 0   . ondansetron (ZOFRAN-ODT) 4 MG disintegrating tablet Take 1 tablet (4 mg total) by mouth every 6 (six) hours as needed for nausea. 20 tablet 0 Past Week at Unknown time  . pantoprazole (PROTONIX) 20 MG tablet Take 1 tablet (20 mg total) by mouth daily. 30 tablet 1 10/11/2018 at Unknown time    Patient Stressors: Health problems Medication change or noncompliance Substance abuse  Patient Strengths: Ability for insight Average or above average intelligence Capable of independent living General fund of knowledge Motivation for  treatment/growth  Treatment Modalities: Medication Management, Group therapy, Case management,  1 to 1 session with clinician, Psychoeducation, Recreational therapy.   Physician Treatment Plan for Primary Diagnosis: MDD, recurrent, severe Long Term Goal(s): Improvement in symptoms so as ready for discharge Improvement in symptoms so as ready for discharge   Short Term Goals: Ability to identify changes in lifestyle to reduce recurrence of condition will improve Ability to maintain clinical measurements within normal limits will improve Ability to identify changes in lifestyle to reduce recurrence of condition will improve Ability to verbalize feelings will improve Ability to disclose and discuss suicidal ideas Ability to demonstrate self-control will improve Ability to identify and develop effective coping behaviors will improve  Medication Management: Evaluate patient's response, side effects, and tolerance of medication regimen.  Therapeutic Interventions: 1 to 1 sessions, Unit Group sessions and Medication administration.  Evaluation of Outcomes: Adequate for discharge  Physician Treatment Plan for Secondary Diagnosis: Active Problems:   Major depressive disorder, 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 psychotic features (HCC)  Long Term Goal(s): Improvement in symptoms so as ready for discharge Improvement in symptoms so as ready for discharge   Short Term Goals: Ability to identify changes in lifestyle to reduce recurrence of condition will improve Ability to maintain clinical measurements within normal limits will improve Ability to identify changes in lifestyle to reduce recurrence of condition will improve Ability to verbalize feelings will improve Ability to disclose and discuss suicidal ideas Ability to demonstrate self-control will improve Ability to identify and develop effective coping behaviors will improve     Medication Management: Evaluate patient's response, side effects,  and tolerance of medication regimen.  Therapeutic Interventions: 1 to 1 sessions, Unit Group sessions and Medication administration.  Evaluation of Outcomes: Adequate for discharge  RN Treatment Plan for Primary Diagnosis: MDD, recurrent, severe Long Term Goal(s): Knowledge of disease and therapeutic regimen to maintain health will improve  Short Term Goals: Ability to remain free from injury will improve, Ability to verbalize feelings will improve and Ability to disclose and discuss suicidal ideas  Medication Management: RN will administer medications as ordered by provider, will assess and evaluate patient's response and provide education to patient for prescribed medication. RN will report any adverse and/or side effects to prescribing provider.  Therapeutic Interventions: 1 on 1 counseling sessions, Psychoeducation, Medication administration, Evaluate responses to treatment, Monitor vital signs and CBGs as ordered, Perform/monitor CIWA, COWS, AIMS and Fall Risk screenings as ordered, Perform wound care treatments as ordered.  Evaluation of Outcomes: Adequate for discharge  LCSW Treatment Plan for Primary Diagnosis:MDD, recurrent, severe Long Term Goal(s): Safe transition  to appropriate next level of care at discharge, Engage patient in therapeutic group addressing interpersonal concerns.  Short Term Goals: Engage patient in aftercare planning with referrals and resources, Facilitate patient progression through stages of change regarding substance use diagnoses and concerns, Identify triggers associated with mental health/substance abuse issues and Increase skills for wellness and recovery  Therapeutic Interventions: Assess for all discharge needs, 1 to 1 time with Social worker, Explore available resources and support systems, Assess for adequacy in community support network, Educate family and significant other(s) on suicide prevention, Complete Psychosocial Assessment, Interpersonal group  therapy.  Evaluation of Outcomes: Adequate for discharge  Progress in Treatment: Attending groups: Yes. Participating in groups: Yes. Taking medication as prescribed: Yes. Toleration medication: Yes. Family/Significant other contact made: SPE completed with pt; pt declined to consent to collateral contact.  Patient understands diagnosis: Yes. Discussing patient identified problems/goals with staff: Yes. Medical problems stabilized or resolved: Yes. Denies suicidal/homicidal ideation: Yes. Issues/concerns per patient self-inventory: No. Other: n/a  New problem(s) identified: No, Describe:  n/a  New Short Term/Long Term Goal(s): detox, medication management for mood stabilization; elimination of SI thoughts; development of comprehensive mental wellness/sobriety plan.   Patient Goals:  "To detox safely from alcohol and get back home."   Discharge Plan or Barriers: Pt has follow-up appt with her PCP at [facility]: Lambert Rhonda [place]. PT declined additional referrals but was receptive to Hospice Grief counseling and AA information. MHAG pamphlet, Mobile Crisis information, and AA/NA information provided to patient for additional community support and resources.   Reason for Continuation of Hospitalization: none  Estimated Length of Stay: today 10/14/18  Attendees: Patient: [name] 10/14/2018 9:05 AM  Physician: Dr. Jama Flavorsobos MD 10/14/2018 9:05 AM  Nursing: Alexia FreestonePatty RN; Marchelle Folksmanda RN 10/14/2018 9:05 AM  RN Care Manager:x 10/14/2018 9:05 AM  Social Worker: Corrie MckusickHeather Krishav Mamone LCSW 10/14/2018 9:05 AM  Recreational Therapist: x 10/14/2018 9:05 AM  Other: Armandina StammerAgnes Nwoko NP; Marciano SequinJanet Sykes NP 10/14/2018 9:05 AM  Other:  10/14/2018 9:05 AM  Other: 10/14/2018 9:05 AM    Scribe for Treatment Team: Rona RavensHeather S Aliyha Fornes, LCSW 10/14/2018 9:05 AM

## 2018-10-14 NOTE — Progress Notes (Signed)
Recreation Therapy Notes  Date: 12.27.19 Time: 0930 Location: [address] Dayroom  Group Topic: Stress Management  Goal Area(s) Addresses:  Patient will engage in stress management practice. Patient will be able to identify stress management techniques.  Behavioral Response: Engaged  Intervention: Stress Management  Activity :  Mindful Meditation.  LRT introduced the stress management technique of meditation.  LRT played a meditation that allowed patients to observe the sounds around them, sensations they may have been feeling on their skin and how they were feeling in the moment.  Patients were to listen and follow along as meditation played to engage in the activity.    Education:  Stress Management, Discharge Planning.   Education Outcome: Acknowledges Education  Clinical Observations/Feedback:  Pt attended and participated in group.    Daneisha Surges, LRT/CTRS         Honore Wipperfurth A 10/14/2018 11:38 AM 

## 2018-10-14 NOTE — Plan of Care (Signed)
  Problem: Education: Goal: Knowledge of [facility] Education information/materials will improve Outcome: Progressing Goal: Verbalization of understanding the information provided will improve Outcome: Progressing   

## 2018-10-14 NOTE — Discharge Summary (Addendum)
Physician Discharge Summary Note  Patient:  [name] is an 62 y.o., female  MRN:  [id number]  DOB:  March 27, 1956  Patient phone:  [phone] (home)   Patient address:   [address] [place] [address],   Total Time spent with patient: Greater than 30 minutes  Date of Admission:  10/12/2018  Date of Discharge: 10-14-18  Reason for Admission: Worsening alcoholism & depression.  Principal Problem: Alcohol dependence with alcohol-induced mood disorder [facility])  Discharge Diagnoses: Principal Problem:   Alcohol dependence with alcohol-induced mood disorder (HCC) Active Problems:   Major depressive disorder, 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 psychotic features (HCC)  Past Psychiatric History: Alcoholism, Major depression  Past Medical History:  Past Medical History:  Diagnosis Date  . Alcohol dependence (HCC)   . Anxiety   . Chronic pain   . Depression   . Herniated cervical disc     Past Surgical History:  Procedure Laterality Date  . ABDOMINAL HYSTERECTOMY    . ANKLE SURGERY Right   . CARPAL TUNNEL RELEASE Bilateral   . CERVICAL FUSION    . CHOLECYSTECTOMY    . KNEE SURGERY     Family History: History reviewed. No pertinent family history.  Family Psychiatric  History: See H&P  Social History:  Social History   Substance and Sexual Activity  Alcohol Use Yes   Comment: " trying to quit "     Social History   Substance and Sexual Activity  Drug Use Yes  . Types: Marijuana    Social History   Socioeconomic History  . Marital status: Single    Spouse name: Not on file  . Number of children: Not on file  . Years of education: Not on file  . Highest education level: Not on file  Occupational History  . Not on file  Social Needs  . Financial resource strain: Somewhat hard  . Food insecurity:    Worry: Sometimes true    Inability: Sometimes true  . Transportation needs:    Medical: No    Non-medical: No  Tobacco Use  . Smoking  status: Current Every Day Smoker    Packs/day: 0.50    Years: 7.00    Pack years: 3.50    Types: Cigarettes  . Smokeless tobacco: Never Used  Substance and Sexual Activity  . Alcohol use: Yes    Comment: " trying to quit "  . Drug use: Yes    Types: Marijuana  . Sexual activity: Not Currently  Lifestyle  . Physical activity:    Days per week: Patient refused    Minutes per session: Patient refused  . Stress: Only a little  Relationships  . Social connections:    Talks on phone: Patient refused    Gets together: Patient refused    Attends religious service: Patient refused    Active member of club or organization: Patient refused    Attends meetings of clubs or organizations: Patient refused    Relationship status: Patient refused  Other Topics Concern  . Not on file  Social History Narrative  . Not on file   Hospital Course: (Per Md's admission evaluation): 62 year old female, presented to the ED via EMS on 12/24, reporting alcohol dependence, depression. Currently denies having suicidal ideations. States she has been drinking daily, heavily. Admission BAL 346, UDS negative. Reports " I could not speak clearly, I could not walk, I have been blacking out more often, I realize I was going to die if I  did not stop drinking ". She also reports depression related to loss of loved ones, particularly since 2018, when her mother and a brother died within a month of each other. States she has been drinking more heavily since then. Endorses some neuro-vegetative symptoms as below.   [name] [facility] detox[facility] & mood stabilization treatments. She came to the hospital ED complaining of drinking too much alcohol & her depression was worsening. Her BAL on admission was 346 per the toxicology tests reports. She was recommended for alcohol detox. Her detoxification treatment was achieved using Ativan detox protocols on a tapering dose format.  Besides the  detoxification treatments, [name] also was medicated and discharged on; Duloxetine 30 mg for depression, Neurontin 400 mg for substance withdrawal syndrome/agitation, Hydroxyzine 25 mg prn for anxiety & Nicotine patch 21 mg for smoking cessation. She also received other medication regimen for her other medical issues presented. She tolerated her treatment regimen without any significant adverse effects and or reactions reported. She was enrolled & participated in the AA/NA meetings & group counseling sessions being offered and held on this unit. She learned coping skills.  [name] has completed detoxification treatments & her mood is currently stable. She is being discharged to her home to continue mental health & substance abuse treatments on an outpatient basis as noted below. She has been given all the necessary information needed to make this appointments without problems.   Upon discharge, [name] denies any SIHI, AVH, delusional thoughts, paranoia & or substance withdrawal symptoms.She received a 7 days worth supple samples of her [facility] discharge medications.  She was able to engage in safety planning including [facility] or contact emergency services if she feels unable to maintain her own safety or the safety of others. Pt had no further questions, comments or concerns. [facility] personal belongings in no apparent distress with all personal belongings.   Physical Findings: AIMS: Facial and Oral Movements Muscles of Facial Expression: None, normal Lips and Perioral Area: None, normal Jaw: None, normal Tongue: None, normal,Extremity Movements Upper (arms, wrists, hands, fingers): None, normal Lower (legs, knees, ankles, toes): None, normal, Trunk Movements Neck, shoulders, hips: None, normal, Overall Severity Severity of abnormal movements (highest score from questions above): None, normal Incapacitation due to abnormal movements: None, normal Patient's awareness of  abnormal movements (rate only patient's report): No Awareness, Dental Status Current problems with teeth and/or dentures?: No Does patient usually wear dentures?: No  CIWA:  CIWA-Ar Total: 4 COWS:     Musculoskeletal: Strength & Muscle Tone: within normal limits Gait & Station: normal Patient leans: N/A  Psychiatric Specialty Exam: Physical Exam  Nursing note and vitals reviewed. Constitutional: She appears well-developed.  HENT:  Head: Normocephalic.  Eyes: Pupils are equal, round, and reactive to light.  Neck: Normal range of motion.  Cardiovascular:  Hx. HTN  Respiratory: Effort normal.  GI: Soft.  Genitourinary:    Genitourinary Comments: Deferred   Musculoskeletal: Normal range of motion.  Neurological: She is alert.  Skin: Skin is warm.    Review of Systems  Constitutional: Negative.   HENT: Negative.   Eyes: Negative.   Respiratory: Negative.  Negative for cough and shortness of breath.   Cardiovascular: Negative.  Negative for chest pain and palpitations.  Gastrointestinal: Negative.  Negative for abdominal pain, heartburn, nausea and vomiting.  Genitourinary: Negative.   Musculoskeletal: Negative.   Skin: Negative.   Neurological: Negative.   Endo/Heme/Allergies: Negative.  Psychiatric/Behavioral: Positive for depression (Stable) and substance abuse (Hx. alcoholism). Negative for hallucinations, memory loss and suicidal ideas. The patient has insomnia (Stable). The patient is not nervous/anxious (Stable).     Blood pressure (!) 166/103, pulse 82, temperature 98.9 F (37.2 C), resp. rate 18, height 5\' 1"  (1.549 m), weight 64.9 kg.Body mass index is 27.02 kg/m.  See Md's discharge SRA   Have you used any form of tobacco in the last 30 days? (Cigarettes, Smokeless Tobacco, Cigars, and/or Pipes): Yes  Has this patient used any form of tobacco in the last 30 days? (Cigarettes, Smokeless Tobacco, Cigars, and/or Pipes): Yes, an FDA-approved tobacco cessation  medication was offered at discharge.  Blood Alcohol level:  Lab Results  Component Value Date   ETH 346 [facility]) 10/11/2018   ETH 435 (HH) 05/15/2018   Metabolic Disorder Labs:  Lab Results  Component Value Date   HGBA1C  10/23/2009    5.4 (NOTE) The ADA recommends the following therapeutic goal for glycemic control related to Hgb A1c measurement: Goal of therapy: <6.5 Hgb A1c  Reference: American Diabetes Association: Clinical Practice Recommendations 2010, Diabetes Care, 2010, 33: (Suppl  1).   MPG 108 10/23/2009   No results found for: PROLACTIN Lab Results  Component Value Date   CHOL  10/23/2009    181        ATP III CLASSIFICATION:  <200     mg/dL   Desirable  161-096  mg/dL   Borderline High  >=045    mg/dL   High          TRIG 69 10/23/2009   HDL 88 10/23/2009   CHOLHDL 2.1 10/23/2009   VLDL 14 10/23/2009   LDLCALC  10/23/2009    79        Total Cholesterol/HDL:CHD Risk Coronary Heart Disease Risk Table                     Men   Women  1/2 Average Risk   3.4   3.3  Average Risk       5.0   4.4  2 X Average Risk   9.6   7.1  3 X Average Risk  23.4   11.0        Use the calculated Patient Ratio above and the CHD Risk Table to determine the patient's CHD Risk.        ATP III CLASSIFICATION (LDL):  <100     mg/dL   Optimal  409-811  mg/dL   Near or Above                    Optimal  130-159  mg/dL   Borderline  914-782  mg/dL   High  >956     mg/dL   Very High   See Psychiatric Specialty Exam and Suicide Risk Assessment completed by Attending Physician prior to discharge.  Discharge destination:  Home  Is patient on multiple antipsychotic therapies at discharge:  No   Has Patient had three or more failed trials of antipsychotic monotherapy by history:  No  Recommended Plan for Multiple Antipsychotic Therapies: NA  Allergies as of 10/14/2018      Reactions   Ketorolac Hives   Tramadol Hives      Medication List    STOP taking these medications    calcium carbonate 500 MG chewable tablet Commonly known as:  TUMS - dosed in mg elemental calcium   ciprofloxacin 500 MG tablet Commonly known  as:  CIPRO   cyclobenzaprine 10 MG tablet Commonly known as:  FLEXERIL   FLUoxetine 20 MG tablet Commonly known as:  PROZAC   gabapentin 800 MG tablet Commonly known as:  NEURONTIN Replaced by:  gabapentin 400 MG capsule   metroNIDAZOLE 500 MG tablet Commonly known as:  FLAGYL   ondansetron 4 MG disintegrating tablet Commonly known as:  ZOFRAN-ODT     TAKE these medications     Indication  DULoxetine 30 MG capsule Commonly known as:  CYMBALTA Take 1 capsule (30 mg total) by mouth daily. For depression Start taking on:  October 15, 2018  Indication:  Major Depressive Disorder   gabapentin 400 MG capsule Commonly known as:  NEURONTIN Take 1 capsule (400 mg total) by mouth 3 (three) times daily. For agitation Replaces:  gabapentin 800 MG tablet  Indication:  Agitation   hydrOXYzine 25 MG tablet Commonly known as:  ATARAX/VISTARIL Take 1 tablet (25 mg total) by mouth every 6 (six) hours as needed. For anxiety  Indication:  Feeling Anxious   nicotine 21 mg/24hr patch Commonly known as:  NICODERM CQ - dosed in mg/24 hours Place 1 patch (21 mg total) onto the skin daily. (May buy from over the counter): Smoking cessation What changed:  additional instructions  Indication:  Nicotine Addiction   pantoprazole 20 MG tablet Commonly known as:  PROTONIX Take 1 tablet (20 mg total) by mouth daily. For acid reflux What changed:  additional instructions  Indication:  Gastroesophageal Reflux Disease      Follow-up Information    [facility] Follow up on 10/20/2018.   Specialty:  Family Medicine Why:  Hospital follow-up/medication management with Dr. Leretha PolSantiago on Thursday, 10/20/2018 at 9:20AM. Please arrive 15 minutes early and bring: photo ID, Medicaid card, and hospital discharge paperwork to this appt. Thank you.  Contact  information: [address] [place] [id number] [phone]         Follow-up recommendations: Activity:  As tolerated Diet: As recommended by your primary care doctor. Keep all scheduled follow-up appointments as recommended.   Comments: Patient is instructed prior to discharge to: Take all medications as prescribed by his/her mental healthcare provider. Report any adverse effects and or reactions from the medicines to his/her outpatient provider promptly. Patient has been instructed & cautioned: To not engage in alcohol and or illegal drug use while on prescription medicines. In the event of worsening symptoms, patient is instructed to call the crisis hotline, 911 and or go to the nearest ED for appropriate evaluation and treatment of symptoms. To follow-up with his/her primary care provider for your other medical issues, concerns and or health care needs.   Signed: Armandina StammerAgnes Nwoko, NP, PMHNP, FNP-BC 10/14/2018, 11:52 AM   Patient seen, Suicide Assessment Completed.  Disposition Plan Reviewed

## 2018-10-14 NOTE — Progress Notes (Signed)
Patient ID: Emily FlakeDarlene Cayton, female   DOB: 1955/11/16, 62 y.o.   MRN: [id number]  Discharge Note  D) Patient discharged to lobby. Patient states readiness for discharge. Patient denies SI/HI, AVH and is not delusional or psychotic.   A) Written and verbal discharge instructions given to the patient. Patient accepting to information and verbalized understanding. Patient agrees to the discharge plan. Opportunity for questions and concerns presented to patient. Patient denied any further questions or concerns. All belongings returned to patient. Patient signed for return of belongings and discharge paperwork. Patient has completed their Suicide Safety Plan and has been provided Suicide Prevention Education. Patient provided an opportunity to complete and return Patient Satisfaction Survey.   R) Patient safely escorted to the lobby. [facility], prescriptions, personal belongings, bus pass, follow-up appointment in place and discharge paperwork.   **Patient refused to wait for her sample of Metoprolol (due to traveling by bus in the dark) but was provided with prescription. Patient was provided with first dose before discharge.

## 2018-10-14 NOTE — [facility] Suicide Risk Assessment (Addendum)
[facility]   Principal Problem:  Alcohol Use Disorder, Alcohol Induced Mood Disorder versus MDD Discharge Diagnoses: Active Problems:   Major depressive disorder, 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 psychotic features (HCC)   Total Time spent with patient: 30 minutes  Musculoskeletal: Strength & Muscle Tone: within normal limits Gait & Station: normal Patient leans: N/A  Psychiatric Specialty Exam: ROS denies headache , no chest pain, no shortness of breath, no vomiting   Blood pressure (!) 166/103, pulse 82, temperature 98.9 F (37.2 C), resp. rate 18, height 5\' 1"  (1.549 m), weight 64.9 kg.Body mass index is 27.02 kg/m.  General Appearance: improving grooming   Eye Contact::  Good  Speech:  Normal Rate409  Volume:  Normal  Mood:  reports she is feeling better  Affect:  improving , vaguely anxious  Thought Process:  Linear and Descriptions of Associations: Intact  Orientation:  Other:  fully alert and attentive, oriented x 3   Thought Content:  no hallucinations, no delusions, not internally preoccupied   Suicidal Thoughts:  No denies suicidal or self injurious ideations, denies homicidal or violent ideations  Homicidal Thoughts:  No  Memory:  recent and remote grossly intact   Judgement:  Fair  Insight:  Fair  Psychomotor Activity:  improved, minimal tremors, no diaphoresis, no resltlessness or agitation  Concentration:  Good  Recall:  Good  Fund of Knowledge:Good  Language: Good  Akathisia:  Negative  Handed:  Right  AIMS (if indicated):     Assets:  Desire for Improvement Resilience  Sleep:  Number of Hours: 6.75  Cognition: WNL  ADL's:  Intact   Mental Status Per Nursing Assessment::   On Admission:  NA  Demographic Factors:  62, lives in a motel, has two adult children, currently on SSI  Loss Factors: Alcohol dependence, loss of loved ones   Historical Factors: History of alcohol dependence, history of depression. Denies prior history of  suicide attempts  Risk Reduction Factors:   Living with another person, especially a relative and Positive coping skills or problem solving skills  Continued Clinical Symptoms:  At this time patient reports she is feeling better. Does present calmer, with improving mood and a more reactive affect . No thought disorder, no suicidal or self injurious ideations, no homicidal or violent ideations, no hallucinations, no delusions , does not appear internally preoccupied. Future oriented, and insists on leaving today because states if she does not return to motel room she is renting today her belongings will be thrown out, room will be given to someone else, and she will have nowhere else to go. No disruptive or agitated behaviors on unit . Minimal distal tremors, no diaphoresis or restlessness, oriented x 3. Denies headache, denies visual disturbances, denies vomiting . BP remains elevated,currently not tachycardic. Denies medication side effects.  Cognitive Features That Contribute To Risk:  No gross cognitive deficits noted upon discharge. Is alert , attentive, and oriented x 3    Suicide Risk:   Mild  Follow-up Information    [facility] Follow up on 10/20/2018.   Specialty:  Family Medicine Why:  Hospital follow-up/medication management with Dr. Leretha PolSantiago on Thursday, 10/20/2018 at 9:20AM. Please arrive 15 minutes early and bring: photo ID, Medicaid card, and hospital discharge paperwork to this appt. Thank you.  Contact information: [address] [place] [place] [phone]          Plan Of Care/Follow-up recommendations:  Activity:  as tolerated  Diet:  heart healthy Tests:  NA Other:  See below Patient is requesting discharge today and has submitted letter requesting discharge . As above, she fears losing her current place of residence if she does not return today. At this time there are no grounds for involuntary commitment . I have reviewed with  patient risk of residual/persistent  withdrawal symptoms, to include serious complications of WDL such as seizures or DTs. She states she feels her WDL symptoms have improved significantly already. We reviewed lab findings , including slightly increased TSH, low T4. Patient to follow up with PCP for retesting and management. Patient also aware BP remains elevated ( no associated symptoms at this time). Not tachycardic at this time, and she does state she has been told she had elevated BP in the past, even when sober  . We discussed starting medications for this , and reviewed with hospitalist. She agrees to start Toprol XL 25 mgrs QDAY for HTN.   Side effects reviewed- she will follow up with  her PCP , has appointment on 1/2 . She agrees to go to ED if any worsening or new symptoms. Craige CottaFernando A Cobos, MD 10/14/2018, 11:26 AM

## 2018-10-14 NOTE — Progress Notes (Addendum)
  [facility] :  Will you be returning to the same living situation after discharge:  Yes,  home ([employer]) At discharge, do you have transportation home?: Yes,  bus Do you have the ability to pay for your medications: Medicaid  Release of information consent forms completed and submitted to medical records by CSW.   Patient to Follow up at: Follow-up Information    [facility] Follow up on 10/20/2018.   Specialty:  Family Medicine Why:  Hospital follow-up/medication management with Dr. Leretha PolSantiago on Thursday, 10/20/2018 at 9:20AM. Please arrive 15 minutes early and bring: photo ID, Medicaid card, and hospital discharge paperwork to this appt. Thank you.  Contact information: [address] [place] [phone]        Pt declined additional referrals but was provided with Hospice Grief counseling information and [place].  Next level of care provider has [facility] to [facility] Link:no  Safety Planning and Suicide Prevention discussed: Yes,  SPE completed with pt; pt declined to consent to family contact.   Have you used any form of tobacco in the last 30 days? (Cigarettes, Smokeless Tobacco, Cigars, and/or Pipes): Yes  Has patient been referred to the Quitline?: Patient refused referral  Patient has been referred for addiction treatment: Pt. refused referral  Rona RavensHeather S Yena Tisby, LCSW 10/14/2018, 9:09 AM

## 2018-10-15 LAB — T3, FREE: T3, Free: 3.2 pg/mL (ref 2.0–4.4)

## 2018-10-20 ENCOUNTER — Ambulatory Visit: Payer: Self-pay | Admitting: Family Medicine

## 2018-10-21 ENCOUNTER — Other Ambulatory Visit: Payer: Self-pay

## 2018-10-21 ENCOUNTER — Emergency Department (HOSPITAL_COMMUNITY)
Admission: EM | Admit: 2018-10-21 | Discharge: 2018-10-21 | Disposition: A | Discharge status: Home / Self Care | Payer: Medicaid Other | Attending: Emergency Medicine | Admitting: Emergency Medicine

## 2018-10-21 ENCOUNTER — Emergency Department (HOSPITAL_COMMUNITY)
Admission: EM | Admit: 2018-10-21 | Discharge: 2018-10-22 | Disposition: A | Discharge status: Home / Self Care | Payer: Medicaid Other | Source: Home / Self Care | Attending: Emergency Medicine | Admitting: Emergency Medicine

## 2018-10-21 ENCOUNTER — Encounter (HOSPITAL_COMMUNITY): Payer: Self-pay

## 2018-10-21 ENCOUNTER — Encounter (HOSPITAL_COMMUNITY): Payer: Self-pay | Admitting: Emergency Medicine

## 2018-10-21 DIAGNOSIS — Y908 Blood alcohol level of 240 mg/100 ml or more: Secondary | ICD-10-CM | POA: Insufficient documentation

## 2018-10-21 DIAGNOSIS — Z79899 Other long term (current) drug therapy: Secondary | ICD-10-CM | POA: Insufficient documentation

## 2018-10-21 DIAGNOSIS — F1010 Alcohol abuse, uncomplicated: Secondary | ICD-10-CM

## 2018-10-21 DIAGNOSIS — F10229 Alcohol dependence with intoxication, unspecified: Secondary | ICD-10-CM | POA: Insufficient documentation

## 2018-10-21 DIAGNOSIS — F17210 Nicotine dependence, cigarettes, uncomplicated: Secondary | ICD-10-CM | POA: Diagnosis not present

## 2018-10-21 DIAGNOSIS — Z5321 Procedure and treatment not carried out due to patient leaving prior to being seen by health care provider: Secondary | ICD-10-CM | POA: Insufficient documentation

## 2018-10-21 DIAGNOSIS — F332 Major depressive disorder, 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 psychotic features: Secondary | ICD-10-CM | POA: Insufficient documentation

## 2018-10-21 DIAGNOSIS — F1020 Alcohol dependence, uncomplicated: Secondary | ICD-10-CM | POA: Diagnosis present

## 2018-10-21 DIAGNOSIS — F10920 Alcohol use, unspecified with intoxication, uncomplicated: Secondary | ICD-10-CM

## 2018-10-21 DIAGNOSIS — F331 Major depressive disorder, recurrent, moderate: Secondary | ICD-10-CM

## 2018-10-21 LAB — COMPREHENSIVE METABOLIC PANEL
ALK PHOS: 52 U/L (ref 38–126)
ALT: 19 U/L (ref 0–44)
AST: 73 U/L — ABNORMAL HIGH (ref 15–41)
Albumin: 4.2 g/dL (ref 3.5–5.0)
Anion gap: 18 — ABNORMAL HIGH (ref 5–15)
BUN: 6 mg/dL — ABNORMAL LOW (ref 8–23)
CALCIUM: 9.6 mg/dL (ref 8.9–10.3)
CO2: 20 mmol/L — ABNORMAL LOW (ref 22–32)
Chloride: 100 mmol/L (ref 98–111)
Creatinine, Ser: 0.84 mg/dL (ref 0.44–1.00)
GFR calc Af Amer: >60 mL/min (ref >60)
GFR calc non Af Amer: >60 mL/min (ref >60)
Glucose, Bld: 88 mg/dL (ref 70–99)
Potassium: 4.9 mmol/L (ref 3.5–5.1)
Sodium: 138 mmol/L (ref 135–145)
Total Bilirubin: 1.8 mg/dL — ABNORMAL HIGH (ref 0.3–1.2)
Total Protein: 7.6 g/dL (ref 6.5–8.1)

## 2018-10-21 LAB — ETHANOL: Alcohol, Ethyl (B): 346 mg/dL (ref <10)

## 2018-10-21 LAB — CBC
HCT: 40.3 % (ref 36.0–46.0)
Hemoglobin: 13.7 g/dL (ref 12.0–15.0)
MCH: 34.1 pg — AB (ref 26.0–34.0)
MCHC: 34 g/dL (ref 30.0–36.0)
MCV: 100.2 fL — AB (ref 80.0–100.0)
Platelets: 271 10*3/uL (ref 150–400)
RBC: 4.02 MIL/uL (ref 3.87–5.11)
RDW: 13.8 % (ref 11.5–15.5)
WBC: 6.4 10*3/uL (ref 4.0–10.5)
nRBC: 0.3 % — ABNORMAL HIGH (ref 0.0–0.2)

## 2018-10-21 LAB — ACETAMINOPHEN LEVEL: Acetaminophen (Tylenol), Serum: <10 ug/mL — ABNORMAL LOW (ref 10–30)

## 2018-10-21 LAB — SALICYLATE LEVEL: Salicylate Lvl: <7 mg/dL (ref 2.8–30.0)

## 2018-10-21 MED ORDER — LORAZEPAM 1 MG PO TABS: 0.0000 mg | ORAL_TABLET | Freq: Four times a day (QID) | ORAL | Status: DC

## 2018-10-21 MED ORDER — ONDANSETRON HCL 4 MG PO TABS: 4.0000 mg | ORAL_TABLET | Freq: Three times a day (TID) | ORAL | Status: DC | PRN

## 2018-10-21 MED ORDER — VITAMIN B-1 100 MG PO TABS: 100.0000 mg | ORAL_TABLET | Freq: Every day | ORAL | Status: DC

## 2018-10-21 MED ORDER — LORAZEPAM 2 MG/ML IJ SOLN: 0.0000 mg | Freq: Four times a day (QID) | INTRAMUSCULAR | Status: DC

## 2018-10-21 MED ORDER — LORAZEPAM 2 MG/ML IJ SOLN: 0.0000 mg | Freq: Two times a day (BID) | INTRAMUSCULAR | Status: DC

## 2018-10-21 MED ORDER — LORAZEPAM 1 MG PO TABS: 0.0000 mg | ORAL_TABLET | Freq: Two times a day (BID) | ORAL | Status: DC

## 2018-10-21 MED ORDER — ACETAMINOPHEN 325 MG PO TABS: 650.0000 mg | ORAL_TABLET | ORAL | Status: DC | PRN

## 2018-10-21 MED ORDER — ONDANSETRON 4 MG PO TBDP
4.0000 mg | ORAL_TABLET | Freq: Once | ORAL | Status: AC
  Administered 2018-10-21: 4 mg via ORAL
  Filled 2018-10-21: qty 1

## 2018-10-21 MED ORDER — THIAMINE HCL 100 MG/ML IJ SOLN: 100.0000 mg | Freq: Every day | INTRAMUSCULAR | Status: DC

## 2018-10-21 MED ORDER — ACETAMINOPHEN 325 MG PO TABS
650.0000 mg | ORAL_TABLET | Freq: Once | ORAL | Status: AC
  Administered 2018-10-21: 650 mg via ORAL
  Filled 2018-10-21: qty 2

## 2018-10-21 NOTE — ED Notes (Signed)
TTS at bedside. 

## 2018-10-21 NOTE — ED Triage Notes (Signed)
Pt left earlier after she was asked to wait in the lobby. She presents now stating that she drank a fifth of vodka and is feeling suicidal and depressed. A&Ox3.

## 2018-10-21 NOTE — ED Notes (Signed)
EDP at bedside  

## 2018-10-21 NOTE — ED Provider Notes (Signed)
[facility]-EMERGENCY DEPT Provider Note   CSN: [id number] Arrival date & time: 10/21/18  2027     History   Chief Complaint Chief Complaint  Patient presents with  . Suicidal    HPI [name] is a 63 y.o. female.  HPI Patient reports she has been binge drinking for about 4 days.  She is not sure what time she had her last drink.  She reports that she has been drinking because she is severely depressed.  She reports she has had deaths in the family and her [facility] with [facility].  She reports she really wants to stop drinking.  He reports he does not want to "die like this".  Patient does not have any plan for hurting or killing herself.  She denies any desire to hurt or kill anybody else.  Reports she just wants something to help her sleep.  She reports that she is very afraid that she will go into withdrawal seizures if she stops drinking. Past Medical History:  Diagnosis Date  . Alcohol dependence (HCC)   . Anxiety   . Chronic pain   . Depression   . Herniated cervical disc     Patient Active Problem List   Diagnosis Date Noted  . Alcohol dependence with alcohol-induced mood disorder (HCC)   . 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 Abdominal pain 09/26/2018  . Alcohol dependence with uncomplicated withdrawal (HCC) 05/16/2018    Past Surgical History:  Procedure Laterality Date  . ABDOMINAL HYSTERECTOMY    . ANKLE SURGERY Right   . CARPAL TUNNEL RELEASE Bilateral   . CERVICAL FUSION    . CHOLECYSTECTOMY    . KNEE SURGERY       OB History   No obstetric history on file.      Home Medications    Prior to Admission medications   Medication Sig Start Date End Date Taking? Authorizing Provider  DULoxetine (CYMBALTA) 30 MG capsule Take 1 capsule (30 mg total) by  mouth daily. For depression 10/15/18   Armandina StammerNwoko, Agnes I, NP  gabapentin (NEURONTIN) 400 MG capsule Take 1 capsule (400 mg total) by mouth 3 (three) times daily. For agitation 10/14/18   Armandina StammerNwoko, Agnes I, NP  hydrOXYzine (ATARAX/VISTARIL) 25 MG tablet Take 1 tablet (25 mg total) by mouth every 6 (six) hours as needed. For anxiety 10/14/18   Armandina StammerNwoko, Agnes I, NP  metoprolol succinate (TOPROL-XL) 25 MG 24 hr tablet Take 1 tablet (25 mg total) by mouth daily. For blood pressure 10/14/18   Aldean BakerSykes, Janet E, NP  nicotine (NICODERM CQ - DOSED IN MG/24 HOURS) 21 mg/24hr patch Place 1 patch (21 mg total) onto the skin daily. (May buy from over the counter): Smoking cessation 10/14/18   Armandina StammerNwoko, Agnes I, NP  pantoprazole (PROTONIX) 20 MG tablet Take 1 tablet (20 mg total) by mouth daily. For acid reflux 10/14/18   Sanjuana KavaNwoko, Agnes I, NP    Family History History reviewed. No pertinent family history.  Social History Social History   Tobacco Use  . Smoking status: Current Every Day Smoker    Packs/day: 0.50    Years: 7.00    Pack years: 3.50    Types: Cigarettes  . Smokeless tobacco: Never Used  Substance Use Topics  . Alcohol use: Yes    Comment: " trying to quit "  . Drug  use: Yes    Types: Marijuana     Allergies   Ketorolac and Tramadol   Review of Systems Review of Systems 10 Systems reviewed and are negative for acute change except as noted in the HPI.  Physical Exam Updated Vital Signs BP 132/85 (BP Location: Left Arm)   Pulse 82   Temp 98.7 F (37.1 C) (Oral)   Resp 16   SpO2 98%   Physical Exam Constitutional:      Comments: Patient is sleeping quietly as I enter the room.  No respiratory distress.  Patient awakened to voice and light stimulus.  Patient smells strongly of alcohol.  HENT:     Head: Normocephalic and atraumatic.     Mouth/Throat:     Mouth: Mucous membranes are moist.  Eyes:     Extraocular Movements: Extraocular movements intact.  Cardiovascular:     Rate and  Rhythm: Normal rate and regular rhythm.  Pulmonary:     Effort: Pulmonary effort is normal.     Breath sounds: Normal breath sounds.  Abdominal:     General: There is no distension.     Palpations: Abdomen is soft.     Tenderness: There is no abdominal tenderness. There is no guarding.  Musculoskeletal: Normal range of motion.        General: No swelling, tenderness, deformity or signs of injury.     Right lower leg: No edema.     Left lower leg: No edema.  Skin:    General: Skin is warm and dry.  Neurological:     Mental Status: She is oriented to person, place, and time.     Motor: No weakness.     Coordination: Coordination normal.  Psychiatric:     Comments: Patient is clearly intoxicated.  She is cooperative and interactive.      ED Treatments / Results  Labs (all labs ordered are listed, but only abnormal results are displayed) Labs Reviewed  COMPREHENSIVE METABOLIC PANEL - Abnormal; Notable for the following components:      Result Value   CO2 20 (*)    BUN 6 (*)    AST 73 (*)    Total Bilirubin 1.8 (*)    Anion gap 18 (*)    All other components within normal limits  CBC - Abnormal; Notable for the following components:   MCV 100.2 (*)    MCH 34.1 (*)    nRBC 0.3 (*)    All other components within normal limits  ETHANOL  SALICYLATE LEVEL  ACETAMINOPHEN LEVEL  RAPID URINE DRUG SCREEN, HOSP PERFORMED    EKG None  Radiology No results found.  Procedures Procedures (including critical care time)  Medications Ordered in ED Medications - No data to display   Initial Impression / Assessment and Plan / ED Course  I have reviewed the triage vital signs and the nursing notes.  Pertinent labs & imaging results that were available during my care of the patient were reviewed by me and considered in my medical decision making (see chart for details).    Patient presents acutely intoxicated.  She reports that she wants help to stop drinking.  Patient does  not endorse being actively suicidal or homicidal.  She does report being severely depressed.  She will need to clear alcohol slightly before TTS evaluation.  She is acutely intoxicated.  She had reported suicidal ideation upon presentation.  At this time she reports severe depression due to loss of family members but is not  actively endorsing suicidal ideation to me currently. She will need to clear alcohol adequately for meaningful interview as she is currently severely intoxicated. Patient does not meet IVC criteria at this time. If patient clears alcohol adequately for safe discharge, steady gait and oriented, does not endorse SI/HI, will be appropriate for D/C with resources.  Final Clinical Impressions(s) / ED Diagnoses   Final diagnoses:  Acute alcoholic intoxication without complication (HCC)  Alcohol abuse  Moderate episode of recurrent major depressive disorder [facility])    ED Discharge Orders    None       Arby Barrette, MD 10/21/18 2337

## 2018-10-21 NOTE — ED Triage Notes (Signed)
Per EMS-states she drank 1/2 of a %th of Vodka today-wants to get sober-has court on 1/6

## 2018-10-21 NOTE — ED Notes (Signed)
Pt sleeping in floor.

## 2018-10-22 NOTE — ED Notes (Signed)
Pt denies SI/HI/AVH. Pt given discharge instructions including f/u appointments. Pt states understanding. Pt states receipt of all belongings.   

## 2018-10-22 NOTE — BHH Counselor (Signed)
Per Everardo Pacific, RN pt is demanding to leave and will be discharged by EDP.   Redmond Pulling, MS, [facility], [facility] Triage Specialist [phone]

## 2018-10-22 NOTE — ED Notes (Signed)
Patient awakened requesting discharge.  MD notified.  [name] taxi called for transport.  Patient is paying for transport.
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
## 2018-10-27 ENCOUNTER — Encounter (HOSPITAL_COMMUNITY): Payer: Self-pay

## 2018-10-27 ENCOUNTER — Emergency Department (HOSPITAL_COMMUNITY)
Admission: EM | Admit: 2018-10-27 | Discharge: 2018-10-27 | Discharge status: Against Medical Advice | Payer: Medicaid Other | Attending: Emergency Medicine | Admitting: Emergency Medicine

## 2018-10-27 DIAGNOSIS — F329 Major depressive disorder, single episode, unspecified: Secondary | ICD-10-CM | POA: Insufficient documentation

## 2018-10-27 DIAGNOSIS — Y908 Blood alcohol level of 240 mg/100 ml or more: Secondary | ICD-10-CM | POA: Insufficient documentation

## 2018-10-27 DIAGNOSIS — Z046 Encounter for general psychiatric examination, requested by authority: Secondary | ICD-10-CM | POA: Insufficient documentation

## 2018-10-27 DIAGNOSIS — Z5329 Procedure and treatment not carried out because of patient's decision for other reasons: Secondary | ICD-10-CM | POA: Insufficient documentation

## 2018-10-27 DIAGNOSIS — F10920 Alcohol use, unspecified with intoxication, uncomplicated: Secondary | ICD-10-CM

## 2018-10-27 LAB — CBC
HCT: 37.8 % (ref 36.0–46.0)
Hemoglobin: 12.9 g/dL (ref 12.0–15.0)
MCH: 32.7 pg (ref 26.0–34.0)
MCHC: 34.1 g/dL (ref 30.0–36.0)
MCV: 95.7 fL (ref 80.0–100.0)
Platelets: 198 10*3/uL (ref 150–400)
RBC: 3.95 MIL/uL (ref 3.87–5.11)
RDW: 14 % (ref 11.5–15.5)
WBC: 5.5 10*3/uL (ref 4.0–10.5)
nRBC: 0 % (ref 0.0–0.2)

## 2018-10-27 LAB — COMPREHENSIVE METABOLIC PANEL
ALT: 58 U/L — AB (ref 0–44)
AST: 117 U/L — ABNORMAL HIGH (ref 15–41)
Albumin: 4.1 g/dL (ref 3.5–5.0)
Alkaline Phosphatase: 45 U/L (ref 38–126)
Anion gap: 15 (ref 5–15)
BUN: <5 mg/dL — ABNORMAL LOW (ref 8–23)
CO2: 23 mmol/L (ref 22–32)
CREATININE: 0.65 mg/dL (ref 0.44–1.00)
Calcium: 9 mg/dL (ref 8.9–10.3)
Chloride: 101 mmol/L (ref 98–111)
GFR calc Af Amer: >60 mL/min (ref >60)
GFR calc non Af Amer: >60 mL/min (ref >60)
Glucose, Bld: 108 mg/dL — ABNORMAL HIGH (ref 70–99)
Potassium: 3.2 mmol/L — ABNORMAL LOW (ref 3.5–5.1)
Sodium: 139 mmol/L (ref 135–145)
Total Bilirubin: 0.7 mg/dL (ref 0.3–1.2)
Total Protein: 7 g/dL (ref 6.5–8.1)

## 2018-10-27 LAB — ETHANOL: Alcohol, Ethyl (B): 413 mg/dL (ref <10)

## 2018-10-27 NOTE — ED Notes (Signed)
Pt ambulated without difficulty; steady gait noted. Getting dressed to leave with [employer]

## 2018-10-27 NOTE — ED Triage Notes (Signed)
Pt here voluntary with [employer] for depression and alcohol intoxication. Pt denies SI/HI.

## 2018-10-27 NOTE — ED Triage Notes (Signed)
Dr.Tegeler came to evaluate pt in triage. Pt currently denying all thoughts of self harm or thoughts. Pt requesting to leave with [employer]. Per MD, pt is safe to leave at this time

## 2018-10-27 NOTE — ED Provider Notes (Signed)
8:57 PM Nursing requested I come see the patient in triage due to concern for possible SI.  On my initial evaluation, patient denies any suicidal ideation.  She reports that she does not want herself and was recently seen for similar symptoms.  She says that she does not have any suicidal ideation and needs to go pay her bills tomorrow.  She reports she is not having any other complaints.  She denies any other symptoms on arrival.  Patient was able to ambulate without difficulty and although alcohol level was elevated, she clinically does not appear significantly intoxicated.  She was able to ambulate without any assistance and did not have any gait abnormality.  Given her denial of suicidal ideation currently and her otherwise well appearance, do not feel patient needs involuntary commitment at this time and we feel she is safe to go home with law enforcement without any further work-up in the emergency department at this time.  We feel she is able to leave against medical days at this time.     Clinical Impression: 1. Acute alcoholic intoxication without complication (HCC)     Disposition: AMA  Condition: Stable  I have discussed the results, Dx and Tx plan with the pt(& family if present). He/she/they expressed understanding and agree(s) with the plan. Discharge instructions discussed at great length. Strict return precautions discussed and pt &/or family have verbalized understanding of the instructions. No further questions at time of discharge.    New Prescriptions   No medications on file    Follow Up: [facility] [address] [place] [address] [phone] Schedule an appointment as soon as possible for a visit        Tegeler, Canary Brim, MD 10/28/18 0140

## 2018-10-27 NOTE — ED Notes (Signed)
Pt now c.o Suicidal ideation

## 2018-10-27 NOTE — Discharge Instructions (Signed)
Your work-up today showed intoxication however otherwise you are well-appearing.  As you denied any suicidal ideation currently, we do not feel it is necessary to have you speak with psychiatry or do further work-up at this time.  As we did not do a full exam and work-up, you will be leaving AGAINST MEDICAL ADVICE however if you have any new symptoms or symptoms change, please return to the nearest emergency department.

## 2018-11-01 ENCOUNTER — Emergency Department (HOSPITAL_COMMUNITY)
Admission: EM | Admit: 2018-11-01 | Discharge: 2018-11-02 | Discharge status: Against Medical Advice | Payer: Medicaid Other | Attending: Emergency Medicine | Admitting: Emergency Medicine

## 2018-11-01 ENCOUNTER — Encounter (HOSPITAL_COMMUNITY): Payer: Self-pay | Admitting: Emergency Medicine

## 2018-11-01 ENCOUNTER — Other Ambulatory Visit: Payer: Self-pay

## 2018-11-01 DIAGNOSIS — Z79899 Other long term (current) drug therapy: Secondary | ICD-10-CM | POA: Diagnosis not present

## 2018-11-01 DIAGNOSIS — F1010 Alcohol abuse, uncomplicated: Secondary | ICD-10-CM

## 2018-11-01 DIAGNOSIS — F10920 Alcohol use, unspecified with intoxication, uncomplicated: Secondary | ICD-10-CM | POA: Diagnosis not present

## 2018-11-01 DIAGNOSIS — F332 Major depressive disorder, 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 psychotic features: Secondary | ICD-10-CM | POA: Insufficient documentation

## 2018-11-01 DIAGNOSIS — F329 Major depressive disorder, single episode, unspecified: Secondary | ICD-10-CM

## 2018-11-01 DIAGNOSIS — F1020 Alcohol dependence, uncomplicated: Secondary | ICD-10-CM | POA: Diagnosis not present

## 2018-11-01 DIAGNOSIS — F32A Depression, unspecified: Secondary | ICD-10-CM

## 2018-11-01 DIAGNOSIS — F17210 Nicotine dependence, cigarettes, uncomplicated: Secondary | ICD-10-CM | POA: Insufficient documentation

## 2018-11-01 DIAGNOSIS — F10929 Alcohol use, unspecified with intoxication, unspecified: Secondary | ICD-10-CM | POA: Diagnosis present

## 2018-11-01 NOTE — ED Notes (Signed)
[facility] Expected date:  Expected time:  Means of arrival:  Comments: 63 yr old ETOH

## 2018-11-01 NOTE — ED Triage Notes (Signed)
Pt arriving via [facility] from [facility] for intoxication. Pt requesting detox and states she is heavily depressed.

## 2018-11-02 ENCOUNTER — Encounter (HOSPITAL_COMMUNITY): Payer: Self-pay | Admitting: Behavioral Health

## 2018-11-02 LAB — COMPREHENSIVE METABOLIC PANEL
ALT: 19 U/L (ref 0–44)
AST: 33 U/L (ref 15–41)
Albumin: 3.7 g/dL (ref 3.5–5.0)
Alkaline Phosphatase: 36 U/L — ABNORMAL LOW (ref 38–126)
Anion gap: 11 (ref 5–15)
BUN: 8 mg/dL (ref 8–23)
CO2: 24 mmol/L (ref 22–32)
Calcium: 9.3 mg/dL (ref 8.9–10.3)
Chloride: 109 mmol/L (ref 98–111)
Creatinine, Ser: 0.57 mg/dL (ref 0.44–1.00)
GFR calc non Af Amer: >60 mL/min (ref >60)
Glucose, Bld: 90 mg/dL (ref 70–99)
Potassium: 3.7 mmol/L (ref 3.5–5.1)
Sodium: 144 mmol/L (ref 135–145)
Total Bilirubin: 0.7 mg/dL (ref 0.3–1.2)
Total Protein: 6.3 g/dL — ABNORMAL LOW (ref 6.5–8.1)

## 2018-11-02 LAB — CBC WITH DIFFERENTIAL/PLATELET
Abs Immature Granulocytes: 0.01 10*3/uL (ref 0.00–0.07)
Basophils Absolute: 0 10*3/uL (ref 0.0–0.1)
Basophils Relative: 1 %
Eosinophils Absolute: 0.1 10*3/uL (ref 0.0–0.5)
Eosinophils Relative: 2 %
HCT: 35.4 % — ABNORMAL LOW (ref 36.0–46.0)
HEMOGLOBIN: 11.5 g/dL — AB (ref 12.0–15.0)
Immature Granulocytes: 0 %
LYMPHS PCT: 54 %
Lymphs Abs: 2.4 10*3/uL (ref 0.7–4.0)
MCH: 33.4 pg (ref 26.0–34.0)
MCHC: 32.5 g/dL (ref 30.0–36.0)
MCV: 102.9 fL — ABNORMAL HIGH (ref 80.0–100.0)
Monocytes Absolute: 0.3 10*3/uL (ref 0.1–1.0)
Monocytes Relative: 6 %
Neutro Abs: 1.7 10*3/uL (ref 1.7–7.7)
Neutrophils Relative %: 37 %
Platelets: 177 10*3/uL (ref 150–400)
RBC: 3.44 MIL/uL — AB (ref 3.87–5.11)
RDW: 14.5 % (ref 11.5–15.5)
WBC: 4.6 10*3/uL (ref 4.0–10.5)
nRBC: 0 % (ref 0.0–0.2)

## 2018-11-02 LAB — ETHANOL: Alcohol, Ethyl (B): 302 mg/dL (ref <10)

## 2018-11-02 MED ORDER — DULOXETINE HCL 30 MG PO CPEP: 30.0000 mg | ORAL_CAPSULE | Freq: Every day | ORAL | Status: DC

## 2018-11-02 MED ORDER — PANTOPRAZOLE SODIUM 20 MG PO TBEC: 20.0000 mg | DELAYED_RELEASE_TABLET | Freq: Every day | ORAL | Status: DC

## 2018-11-02 MED ORDER — METOPROLOL SUCCINATE ER 25 MG PO TB24
25.0000 mg | ORAL_TABLET | Freq: Every day | ORAL | Status: DC
  Administered 2018-11-02: 25 mg via ORAL
  Filled 2018-11-02: qty 1

## 2018-11-02 MED ORDER — LORAZEPAM 1 MG PO TABS: 0.0000 mg | ORAL_TABLET | Freq: Two times a day (BID) | ORAL | Status: DC

## 2018-11-02 MED ORDER — GABAPENTIN 400 MG PO CAPS: 400.0000 mg | ORAL_CAPSULE | Freq: Three times a day (TID) | ORAL | Status: DC

## 2018-11-02 MED ORDER — VITAMIN B-1 100 MG PO TABS: 100.0000 mg | ORAL_TABLET | Freq: Every day | ORAL | Status: DC

## 2018-11-02 MED ORDER — HYDROXYZINE HCL 25 MG PO TABS: 25.0000 mg | ORAL_TABLET | Freq: Four times a day (QID) | ORAL | Status: DC | PRN

## 2018-11-02 MED ORDER — LORAZEPAM 1 MG PO TABS
0.0000 mg | ORAL_TABLET | Freq: Four times a day (QID) | ORAL | Status: DC
  Administered 2018-11-02: 1 mg via ORAL
  Filled 2018-11-02: qty 1

## 2018-11-02 MED ORDER — NICOTINE 21 MG/24HR TD PT24: 21.0000 mg | MEDICATED_PATCH | Freq: Every day | TRANSDERMAL | Status: DC
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
## 2018-11-02 NOTE — ED Provider Notes (Signed)
[facility]-EMERGENCY DEPT Provider Note   CSN: [id number] Arrival date & time: 11/01/18  2234  Time seen 11:50 PM   History   Chief Complaint Chief Complaint  Patient presents with  . Alcohol Intoxication    HPI [name] is a 63 y.o. female.  HPI patient has a history of alcoholism and this is her fourth ED visit in January for being intoxicated and requesting to be detoxed and then is discharged.  When I ask her what is going on tonight she states "I am an alcoholic and I have seizures".  She cannot remember when the last time she had detox or rehab and cannot tell me the last time she was sober.  She has been seen in the ED several times this month and has not followed through on any outpatient treatment that was recommended.  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  She states "I want help".  She denies feeling suicidal or homicidal.  She states she has a long history of depression.  She states she called [facility] today and they told her she would have to be sober before they could evaluate her.  [facility]  PCP Patient, No Pcp Per   Past Medical History:  Diagnosis Date  . Alcohol dependence (HCC)   . Anxiety   . Chronic pain   . Depression   . Herniated cervical disc     Patient Active Problem List   Diagnosis Date Noted  . Alcohol dependence with alcohol-induced mood disorder (HCC)   . 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 Abdominal pain 09/26/2018  . Alcohol dependence with uncomplicated withdrawal (HCC) 05/16/2018    Past Surgical History:  Procedure Laterality Date  . ABDOMINAL HYSTERECTOMY    . ANKLE SURGERY Right   . CARPAL TUNNEL RELEASE Bilateral   . CERVICAL FUSION    . CHOLECYSTECTOMY    . KNEE SURGERY        OB History   No obstetric history on file.      Home Medications    Prior to Admission medications   Medication Sig Start Date End Date Taking? Authorizing Provider  DULoxetine (CYMBALTA) 30 MG capsule Take 1 capsule (30 mg total) by mouth daily. For depression 10/15/18   Armandina StammerNwoko, Agnes I, NP  gabapentin (NEURONTIN) 400 MG capsule Take 1 capsule (400 mg total) by mouth 3 (three) times daily. For agitation 10/14/18   Armandina StammerNwoko, Agnes I, NP  hydrOXYzine (ATARAX/VISTARIL) 25 MG tablet Take 1 tablet (25 mg total) by mouth every 6 (six) hours as needed. For anxiety 10/14/18   Armandina StammerNwoko, Agnes I, NP  metoprolol succinate (TOPROL-XL) 25 MG 24 hr tablet Take 1 tablet (25 mg total) by mouth daily. For blood pressure 10/14/18   Aldean BakerSykes, Janet E, NP  nicotine (NICODERM CQ - DOSED IN MG/24 HOURS) 21 mg/24hr patch Place 1 patch (21 mg total) onto the skin daily. (May buy from over the counter): Smoking cessation 10/14/18   Armandina StammerNwoko, Agnes I, NP  pantoprazole (PROTONIX) 20 MG tablet Take 1 tablet (20 mg total) by mouth daily. For acid reflux 10/14/18   Armandina StammerNwoko, Agnes I, NP    Family History No family history on file.  Social History Social History   Tobacco Use  . Smoking status: Current Every Day Smoker  Packs/day: 0.50    Years: 7.00    Pack years: 3.50    Types: Cigarettes  . Smokeless tobacco: Never Used  Substance Use Topics  . Alcohol use: Yes    Comment: " trying to quit "  . Drug use: Yes    Types: Marijuana  Patient states she is on disability because she has plates in her spine   Allergies   Ketorolac and Tramadol   Review of Systems Review of Systems  All other systems reviewed and are negative.    Physical Exam Updated Vital Signs BP 120/81 (BP Location: Right Arm)   Pulse 76   Temp 97.7 F (36.5 C) (Oral)   SpO2 96%   Vital signs normal    Physical Exam Vitals signs and nursing note reviewed.  Constitutional:      General: She is not in acute distress.     Appearance: Normal appearance. She is well-developed. She is not ill-appearing or toxic-appearing.     Comments: Patient appears to be intoxicated.  HENT:     Head: Normocephalic and atraumatic.     Right Ear: External ear normal.     Left Ear: External ear normal.     Nose: Nose normal. No mucosal edema or rhinorrhea.     Mouth/Throat:     Dentition: No dental abscesses.     Pharynx: No uvula swelling.  Eyes:     Conjunctiva/sclera: Conjunctivae normal.     Pupils: Pupils are equal, round, and reactive to light.  Neck:     Musculoskeletal: Full passive range of motion without pain, normal range of motion and neck supple.  Cardiovascular:     Rate and Rhythm: Normal rate and regular rhythm.     Heart sounds: Normal heart sounds. No murmur. No friction rub. No gallop.   Pulmonary:     Effort: Pulmonary effort is normal. No respiratory distress.     Breath sounds: Normal breath sounds. No wheezing, rhonchi or rales.  Chest:     Chest wall: No tenderness or crepitus.  Abdominal:     General: Bowel sounds are normal. There is no distension.     Palpations: Abdomen is soft.     Tenderness: There is no abdominal tenderness. There is no guarding or rebound.  Musculoskeletal: Normal range of motion.        General: No tenderness.     Comments: Moves all extremities well.   Skin:    General: Skin is warm and dry.     Coloration: Skin is not pale.     Findings: No erythema or rash.  Neurological:     Mental Status: She is alert and oriented to person, place, and time.     Cranial Nerves: No cranial nerve deficit.  Psychiatric:        Mood and Affect: Affect is flat.        Speech: Speech is slurred.        Behavior: Behavior normal.      ED Treatments / Results  Labs (all labs ordered are listed, but only abnormal results are displayed) Labs Reviewed  COMPREHENSIVE METABOLIC PANEL - Abnormal; Notable for the following components:      Result Value   Total Protein 6.3 (*)     Alkaline Phosphatase 36 (*)    All other components within normal limits  ETHANOL - Abnormal; Notable for the following components:   Alcohol, Ethyl (B) 302 (*)    All other components within normal limits  CBC WITH DIFFERENTIAL/PLATELET - Abnormal; Notable for the following components:   RBC 3.44 (*)    Hemoglobin 11.5 (*)    HCT 35.4 (*)    MCV 102.9 (*)    All other components within normal limits    EKG None  Radiology No results found.  Procedures Procedures (including critical care time)  Medications Ordered in ED Medications  LORazepam (ATIVAN) tablet 0-4 mg (1 mg Oral Given 11/02/18 0705)  LORazepam (ATIVAN) tablet 0-4 mg (has no administration in time range)  thiamine (VITAMIN B-1) tablet 100 mg (has no administration in time range)     Initial Impression / Assessment and Plan / ED Course  I have reviewed the triage vital signs and the nursing notes.  Pertinent labs & imaging results that were available during my care of the patient were reviewed by me and considered in my medical decision making (see chart for details).     Laboratory testing was done.  CIWA protocol was started.  Recheck at 5:30 AM patient sleeping, she is easily awakened.  I asked her how she feels and she goes "not well".  She states she is having some discomfort in her stomach from being stressed.  She states she is afraid that she is going to start having the shakes forearm withdrawal.  She feels at this point she is going to be able to talk to TTS.  When I look at her she is not having any tremor.  7 AM patient is sleeping in her stretcher in the hall.  She is waiting for TTS evaluation.  If patient decides she wants to leave she can, she has expressed no suicidal or homicidal ideation.  Final Clinical Impressions(s) / ED Diagnoses   Final diagnoses:  Alcoholic intoxication without complication (HCC)  ETOH abuse  Depression, unspecified depression type    Disposition pending  Devoria Albe, MD, Concha Pyo, MD 11/02/18 [id number]

## 2018-11-02 NOTE — ED Notes (Signed)
Patient states she would like to leave.  States she has an appointment at [facility] today and would like to go ahead and leave.  Currently alert and oriented, ate breakfast without difficulty.  Given bus pass.

## 2018-11-02 NOTE — Discharge Instructions (Signed)
For your behavioral health needs you are advised to follow up with [facility].  New patients are seen at their walk-in clinic.  Walk-in hours are Monday - Friday from 8:00 am - 12:00 pm, and from 1:00 pm - 3:00 pm.  Walk-in patients are seen on a first come, first served basis, so try to arrive as early as possible for the best chance of being seen the same day.  There is an initial fee of $22.50: ° °     [facility] °     [address] °     [place], [place] [address] °     [phone] °

## 2018-11-02 NOTE — BH Assessment (Signed)
[facility] Assessment Progress Note  Per Caryn Beeakia Starkes-Perry, PMHNP, this pt does not require psychiatric hospitalization at this time.  [facility] to [facility].  This has been included in pt's discharge instructions.  Pt would also benefit from seeing [facility]; they will be asked to speak to pt.  Pt's nurse has been notified.  Emily Canninghomas Mckenze Slone, MA Triage Specialist [phone]

## 2018-11-14 ENCOUNTER — Encounter (HOSPITAL_COMMUNITY): Payer: Self-pay

## 2018-11-14 ENCOUNTER — Other Ambulatory Visit: Payer: Self-pay

## 2018-11-14 ENCOUNTER — Emergency Department (HOSPITAL_COMMUNITY): Payer: Medicaid Other

## 2018-11-14 ENCOUNTER — Emergency Department (HOSPITAL_COMMUNITY)
Admission: EM | Admit: 2018-11-14 | Discharge: 2018-11-14 | Disposition: A | Discharge status: Home / Self Care | Payer: Medicaid Other | Attending: Emergency Medicine | Admitting: Emergency Medicine

## 2018-11-14 DIAGNOSIS — Z79899 Other long term (current) drug therapy: Secondary | ICD-10-CM | POA: Insufficient documentation

## 2018-11-14 DIAGNOSIS — Y9259 Other trade areas as the place of occurrence of the external cause: Secondary | ICD-10-CM | POA: Insufficient documentation

## 2018-11-14 DIAGNOSIS — Y999 Unspecified external cause status: Secondary | ICD-10-CM | POA: Diagnosis not present

## 2018-11-14 DIAGNOSIS — F17210 Nicotine dependence, cigarettes, uncomplicated: Secondary | ICD-10-CM | POA: Diagnosis not present

## 2018-11-14 DIAGNOSIS — S0990XA Unspecified injury of head, initial encounter: Secondary | ICD-10-CM | POA: Diagnosis present

## 2018-11-14 DIAGNOSIS — Y9389 Activity, other specified: Secondary | ICD-10-CM | POA: Insufficient documentation

## 2018-11-14 DIAGNOSIS — F10920 Alcohol use, unspecified with intoxication, uncomplicated: Secondary | ICD-10-CM | POA: Insufficient documentation

## 2018-11-14 LAB — CBC WITH DIFFERENTIAL/PLATELET
Abs Immature Granulocytes: 0.02 10*3/uL (ref 0.00–0.07)
Basophils Absolute: 0.1 10*3/uL (ref 0.0–0.1)
Basophils Relative: 1 %
EOS ABS: 0.1 10*3/uL (ref 0.0–0.5)
EOS PCT: 1 %
HCT: 42.5 % (ref 36.0–46.0)
Hemoglobin: 13.5 g/dL (ref 12.0–15.0)
Immature Granulocytes: 0 %
Lymphocytes Relative: 55 %
Lymphs Abs: 2.7 10*3/uL (ref 0.7–4.0)
MCH: 32.2 pg (ref 26.0–34.0)
MCHC: 31.8 g/dL (ref 30.0–36.0)
MCV: 101.4 fL — ABNORMAL HIGH (ref 80.0–100.0)
Monocytes Absolute: 0.3 10*3/uL (ref 0.1–1.0)
Monocytes Relative: 6 %
Neutro Abs: 1.8 10*3/uL (ref 1.7–7.7)
Neutrophils Relative %: 37 %
PLATELETS: 368 10*3/uL (ref 150–400)
RBC: 4.19 MIL/uL (ref 3.87–5.11)
RDW: 14.5 % (ref 11.5–15.5)
WBC: 4.9 10*3/uL (ref 4.0–10.5)
nRBC: 0 % (ref 0.0–0.2)

## 2018-11-14 LAB — COMPREHENSIVE METABOLIC PANEL
ALT: 18 U/L (ref 0–44)
ANION GAP: 12 (ref 5–15)
AST: 37 U/L (ref 15–41)
Albumin: 4.1 g/dL (ref 3.5–5.0)
Alkaline Phosphatase: 54 U/L (ref 38–126)
BUN: 5 mg/dL — ABNORMAL LOW (ref 8–23)
CO2: 23 mmol/L (ref 22–32)
Calcium: 9 mg/dL (ref 8.9–10.3)
Chloride: 108 mmol/L (ref 98–111)
Creatinine, Ser: 0.71 mg/dL (ref 0.44–1.00)
GFR calc Af Amer: >60 mL/min (ref >60)
GFR calc non Af Amer: >60 mL/min (ref >60)
GLUCOSE: 89 mg/dL (ref 70–99)
Potassium: 4.1 mmol/L (ref 3.5–5.1)
Sodium: 143 mmol/L (ref 135–145)
Total Bilirubin: 0.9 mg/dL (ref 0.3–1.2)
Total Protein: 7 g/dL (ref 6.5–8.1)

## 2018-11-14 LAB — ETHANOL: Alcohol, Ethyl (B): 341 mg/dL (ref <10)

## 2018-11-14 MED ORDER — IBUPROFEN 400 MG PO TABS
400.0000 mg | ORAL_TABLET | Freq: Once | ORAL | Status: AC
  Administered 2018-11-14: 400 mg via ORAL
  Filled 2018-11-14: qty 1

## 2018-11-14 MED ORDER — VITAMIN B-1 100 MG PO TABS
100.0000 mg | ORAL_TABLET | Freq: Once | ORAL | Status: AC
  Administered 2018-11-14: 100 mg via ORAL
  Filled 2018-11-14: qty 1

## 2018-11-14 MED ORDER — ONDANSETRON HCL 4 MG/2ML IJ SOLN
4.0000 mg | Freq: Once | INTRAMUSCULAR | Status: AC
  Administered 2018-11-14: 4 mg via INTRAVENOUS
  Filled 2018-11-14: qty 2

## 2018-11-14 MED ORDER — SODIUM CHLORIDE 0.9 % IV BOLUS
500.0000 mL | Freq: Once | INTRAVENOUS | Status: AC
  Administered 2018-11-14: 500 mL via INTRAVENOUS
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
## 2018-11-24 ENCOUNTER — Encounter

## 2018-11-24 ENCOUNTER — Ambulatory Visit: Payer: Self-pay | Admitting: Family Medicine

## 2018-11-26 ENCOUNTER — Other Ambulatory Visit: Payer: Self-pay

## 2018-11-26 ENCOUNTER — Encounter (HOSPITAL_COMMUNITY): Payer: Self-pay | Admitting: Emergency Medicine

## 2018-11-26 ENCOUNTER — Emergency Department (HOSPITAL_COMMUNITY)
Admission: EM | Admit: 2018-11-26 | Discharge: 2018-11-26 | Disposition: A | Discharge status: Home / Self Care | Payer: Medicaid Other | Attending: Emergency Medicine | Admitting: Emergency Medicine

## 2018-11-26 DIAGNOSIS — F329 Major depressive disorder, single episode, unspecified: Secondary | ICD-10-CM

## 2018-11-26 DIAGNOSIS — F332 Major depressive disorder, 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 psychotic features: Secondary | ICD-10-CM | POA: Diagnosis not present

## 2018-11-26 DIAGNOSIS — Z79899 Other long term (current) drug therapy: Secondary | ICD-10-CM | POA: Insufficient documentation

## 2018-11-26 DIAGNOSIS — F1010 Alcohol abuse, uncomplicated: Secondary | ICD-10-CM | POA: Diagnosis not present

## 2018-11-26 DIAGNOSIS — F17210 Nicotine dependence, cigarettes, uncomplicated: Secondary | ICD-10-CM | POA: Diagnosis not present

## 2018-11-26 DIAGNOSIS — R4589 Other symptoms and signs involving emotional state: Secondary | ICD-10-CM

## 2018-11-26 MED ORDER — CHLORDIAZEPOXIDE HCL 25 MG PO CAPS: ORAL_CAPSULE | ORAL | 0 refills | Status: DC

## 2018-11-26 MED ORDER — GABAPENTIN 400 MG PO CAPS: 400.0000 mg | ORAL_CAPSULE | Freq: Three times a day (TID) | ORAL | 0 refills | Status: DC

## 2018-11-26 MED ORDER — HYDROXYZINE HCL 25 MG PO TABS: 25.0000 mg | ORAL_TABLET | Freq: Four times a day (QID) | ORAL | 0 refills | Status: DC | PRN

## 2018-11-26 MED ORDER — DULOXETINE HCL 30 MG PO CPEP: 30.0000 mg | ORAL_CAPSULE | Freq: Every day | ORAL | 0 refills | Status: DC

## 2018-11-26 NOTE — ED Provider Notes (Signed)
[facility]-EMERGENCY DEPT Provider Note   CSN: [id number] Arrival date & time: 11/26/18  1935     History   Chief Complaint Chief Complaint  Patient presents with  . Medical Clearance    HPI [name] is a 63 y.o. female.  The history is provided by the patient and medical records. No language interpreter was used.   [name] is a 63 y.o. female  with a PMH of alcohol abuse, depression who presents to the Emergency Department complaining of depressed mood lately.  Patient states that she would never hurt herself or hurt anyone else, but struggles with depression and has lost several family members in the last 2 years which has been tough for her.  She was on antidepressants, but ran out of them last week and feels like this is made her depression worse.  She states that she often uses alcohol to numb her pain.  She does not like how she feels when she is drinking, but is scared to quit for concerns of having a seizure.  Denies any homicidal thoughts.  No auditory or visual hallucinations.  She called [facility] who informed her that she will come in the morning for a walk-in appointment Monday through Friday.  She plans on doing this.  She really did not want to wait until Monday to start back on her medications for concerns that her depression may get worse.  Past Medical History:  Diagnosis Date  . Alcohol dependence (HCC)   . Anxiety   . Chronic pain   . Depression   . Herniated cervical disc     Patient Active Problem List   Diagnosis Date Noted  . Alcohol dependence with alcohol-induced mood disorder (HCC)   . 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 Abdominal pain 09/26/2018  . Alcohol dependence with uncomplicated withdrawal (HCC) 05/16/2018    Past Surgical History:  Procedure Laterality Date  .  ABDOMINAL HYSTERECTOMY    . ANKLE SURGERY Right   . CARPAL TUNNEL RELEASE Bilateral   . CERVICAL FUSION    . CHOLECYSTECTOMY    . KNEE SURGERY       OB History   No obstetric history on file.      Home Medications    Prior to Admission medications   Medication Sig Start Date End Date Taking? Authorizing Provider  chlordiazePOXIDE (LIBRIUM) 25 MG capsule 50mg  PO TID x 1D, then 25-50mg  PO BID X 1D, then 25-50mg  PO QD X 1D 11/26/18   Mako Pelfrey, Chase PicketJaime Pilcher, PA-C  DULoxetine (CYMBALTA) 30 MG capsule Take 1 capsule (30 mg total) by mouth daily. For depression 11/26/18   Thamara Leger, Chase PicketJaime Pilcher, PA-C  gabapentin (NEURONTIN) 400 MG capsule Take 1 capsule (400 mg total) by mouth 3 (three) times daily. For agitation 11/26/18   Lavene Penagos, Chase PicketJaime Pilcher, PA-C  hydrOXYzine (ATARAX/VISTARIL) 25 MG tablet Take 1 tablet (25 mg total) by mouth every 6 (six) hours as needed. For anxiety 11/26/18   Cheila Wickstrom, Chase PicketJaime Pilcher, PA-C  metoprolol succinate (TOPROL-XL) 25 MG 24 hr tablet Take 1 tablet (25 mg total) by mouth daily. For blood pressure 10/14/18   Aldean BakerSykes, Janet E, NP  nicotine (NICODERM CQ - DOSED IN MG/24 HOURS) 21 mg/24hr patch Place 1 patch (21 mg total) onto the skin daily. (May buy from over the counter): Smoking cessation 10/14/18   Armandina StammerNwoko, Agnes I, NP  pantoprazole (PROTONIX) 20  MG tablet Take 1 tablet (20 mg total) by mouth daily. For acid reflux 10/14/18   Armandina Stammer I, NP    Family History No family history on file.  Social History Social History   Tobacco Use  . Smoking status: Current Every Day Smoker    Packs/day: 0.50    Years: 7.00    Pack years: 3.50    Types: Cigarettes  . Smokeless tobacco: Never Used  Substance Use Topics  . Alcohol use: Yes    Alcohol/week: 3.0 standard drinks    Types: 3 Standard drinks or equivalent per week    Comment: Daily use ''alcohol'' -- BAAC was 302  . Drug use: Yes    Types: Marijuana     Allergies   Ketorolac and Tramadol   Review of Systems Review  of Systems  Psychiatric/Behavioral: Negative for suicidal ideas.       + Depressed mood  All other systems reviewed and are negative.    Physical Exam Updated Vital Signs BP 120/81 (BP Location: Left Arm)   Pulse 70   Temp 97.8 F (36.6 C) (Oral)   Resp 18   SpO2 98%   Physical Exam Vitals signs and nursing note reviewed.  Constitutional:      General: She is not in acute distress.    Appearance: She is well-developed.  HENT:     Head: Normocephalic and atraumatic.  Neck:     Musculoskeletal: Neck supple.  Cardiovascular:     Rate and Rhythm: Normal rate and regular rhythm.     Heart sounds: Normal heart sounds. No murmur.  Pulmonary:     Effort: Pulmonary effort is normal. No respiratory distress.     Breath sounds: Normal breath sounds.  Abdominal:     General: There is no distension.     Palpations: Abdomen is soft.     Tenderness: There is no abdominal tenderness.  Skin:    General: Skin is warm and dry.  Neurological:     Mental Status: She is alert and oriented to person, place, and time.      ED Treatments / Results  Labs (all labs ordered are listed, but only abnormal results are displayed) Labs Reviewed - No data to display  EKG None  Radiology No results found.  Procedures Procedures (including critical care time)  Medications Ordered in ED Medications - No data to display   Initial Impression / Assessment and Plan / ED Course  I have reviewed the triage vital signs and the nursing notes.  Pertinent labs & imaging results that were available during my care of the patient were reviewed by me and considered in my medical decision making (see chart for details).     [name] is a 63 y.o. female who presents to ED for depressed mood and alcohol abuse.  Afebrile and hemodynamically stable with reassuring exam.  She denies any suicidal or homicidal thoughts.  No auditory or visual hallucinations.  Does not seem to be in acute alcoholic  withdrawal currently.  She does express interest in alcohol cessation.  We discussed Librium as her main concern is going into withdrawals if she attempts to quit.  She would like to try this.  As far as her depression goes, she has been out of her medications for a little over a week now and feels as if her depression is worse than baseline due to this.  I will refill her medications, and recommended that she go to [facility] first thing Monday  morning for further discussion of this.  I again asked if she was having any thoughts of harming herself and she reports that she would never hurt herself, she just feels very lonely and would like to feel better.  She contracts for safety.  Follow-up care, home care instructions and return precautions were discussed.  Outpatient resources provided.  All questions answered.   Final Clinical Impressions(s) / ED Diagnoses   Final diagnoses:  Alcohol abuse  Depressed mood    ED Discharge Orders         Ordered    DULoxetine (CYMBALTA) 30 MG capsule  Daily     11/26/18 2052    gabapentin (NEURONTIN) 400 MG capsule  3 times daily     11/26/18 2052    hydrOXYzine (ATARAX/VISTARIL) 25 MG tablet  Every 6 hours PRN     11/26/18 2052    chlordiazePOXIDE (LIBRIUM) 25 MG capsule     11/26/18 2052           Jacqulyne Gladue, Chase Picket, PA-C 11/26/18 2113    Tilden Fossa, MD 11/27/18 [phone]

## 2018-11-26 NOTE — ED Notes (Signed)
Bed: WLPT1 Expected date:  Expected time:  Means of arrival:  Comments: 

## 2018-11-26 NOTE — ED Notes (Signed)
Refused vitals  [employer] service on the way

## 2018-11-26 NOTE — ED Triage Notes (Signed)
Per [facility], Pt has been drinking today, is a resident of [employer], she said she would be okay, not living.

## 2018-11-26 NOTE — Discharge Instructions (Signed)
It was my pleasure taking care of you today!   Take Librium as directed to help prevent alcohol withdrawal symptoms.  I have refilled your home medications as we discussed.  Take them as directed.  Please follow-up with [facility] for further discussion of your depression.  Return to the emergency department for thoughts of hurting yourself, new or worsening symptoms, any additional concerns.

## 2018-11-26 NOTE — ED Notes (Signed)
Bed: WLPT3 Expected date:  Expected time:  Means of arrival:  Comments: 

## 2018-12-10 ENCOUNTER — Encounter (HOSPITAL_COMMUNITY): Payer: Self-pay | Admitting: Emergency Medicine

## 2018-12-10 ENCOUNTER — Emergency Department (HOSPITAL_COMMUNITY)
Admission: EM | Admit: 2018-12-10 | Discharge: 2018-12-10 | Disposition: A | Discharge status: Home / Self Care | Payer: Medicaid Other | Source: Home / Self Care | Attending: Emergency Medicine | Admitting: Emergency Medicine

## 2018-12-10 DIAGNOSIS — F1010 Alcohol abuse, uncomplicated: Secondary | ICD-10-CM

## 2018-12-10 DIAGNOSIS — F17210 Nicotine dependence, cigarettes, uncomplicated: Secondary | ICD-10-CM | POA: Insufficient documentation

## 2018-12-10 DIAGNOSIS — Z79899 Other long term (current) drug therapy: Secondary | ICD-10-CM | POA: Insufficient documentation

## 2018-12-10 DIAGNOSIS — F329 Major depressive disorder, single episode, unspecified: Secondary | ICD-10-CM

## 2018-12-10 DIAGNOSIS — R109 Unspecified abdominal pain: Secondary | ICD-10-CM

## 2018-12-10 DIAGNOSIS — R112 Nausea with vomiting, unspecified: Secondary | ICD-10-CM

## 2018-12-10 DIAGNOSIS — F32A Depression, unspecified: Secondary | ICD-10-CM

## 2018-12-10 LAB — URINALYSIS, ROUTINE W REFLEX MICROSCOPIC
Bilirubin Urine: NEGATIVE
Glucose, UA: NEGATIVE mg/dL
Ketones, ur: 20 mg/dL — AB
LEUKOCYTE UA: NEGATIVE
Nitrite: NEGATIVE
Protein, ur: NEGATIVE mg/dL
SPECIFIC GRAVITY, URINE: 1.013 (ref 1.005–1.030)
pH: 5 (ref 5.0–8.0)

## 2018-12-10 LAB — CBC WITH DIFFERENTIAL/PLATELET
Abs Immature Granulocytes: 0 10*3/uL (ref 0.00–0.07)
BASOS PCT: 1 %
Basophils Absolute: 0 10*3/uL (ref 0.0–0.1)
Eosinophils Absolute: 0 10*3/uL (ref 0.0–0.5)
Eosinophils Relative: 0 %
HEMATOCRIT: 43.6 % (ref 36.0–46.0)
Hemoglobin: 14 g/dL (ref 12.0–15.0)
IMMATURE GRANULOCYTES: 0 %
Lymphocytes Relative: 55 %
Lymphs Abs: 2.2 10*3/uL (ref 0.7–4.0)
MCH: 33.3 pg (ref 26.0–34.0)
MCHC: 32.1 g/dL (ref 30.0–36.0)
MCV: 103.8 fL — ABNORMAL HIGH (ref 80.0–100.0)
Monocytes Absolute: 0.2 10*3/uL (ref 0.1–1.0)
Monocytes Relative: 5 %
Neutro Abs: 1.6 10*3/uL — ABNORMAL LOW (ref 1.7–7.7)
Neutrophils Relative %: 39 %
Platelets: 144 10*3/uL — ABNORMAL LOW (ref 150–400)
RBC: 4.2 MIL/uL (ref 3.87–5.11)
RDW: 14 % (ref 11.5–15.5)
WBC: 3.9 10*3/uL — ABNORMAL LOW (ref 4.0–10.5)
nRBC: 0 % (ref 0.0–0.2)

## 2018-12-10 LAB — COMPREHENSIVE METABOLIC PANEL
ALT: 107 U/L — ABNORMAL HIGH (ref 0–44)
AST: 223 U/L — ABNORMAL HIGH (ref 15–41)
Albumin: 4.7 g/dL (ref 3.5–5.0)
Alkaline Phosphatase: 79 U/L (ref 38–126)
Anion gap: 17 — ABNORMAL HIGH (ref 5–15)
BUN: 8 mg/dL (ref 8–23)
CO2: 19 mmol/L — ABNORMAL LOW (ref 22–32)
Calcium: 8.5 mg/dL — ABNORMAL LOW (ref 8.9–10.3)
Chloride: 101 mmol/L (ref 98–111)
Creatinine, Ser: 0.59 mg/dL (ref 0.44–1.00)
GFR calc Af Amer: >60 mL/min (ref >60)
GFR calc non Af Amer: >60 mL/min (ref >60)
Glucose, Bld: 84 mg/dL (ref 70–99)
Potassium: 3.9 mmol/L (ref 3.5–5.1)
Sodium: 137 mmol/L (ref 135–145)
TOTAL PROTEIN: 7.3 g/dL (ref 6.5–8.1)
Total Bilirubin: 0.7 mg/dL (ref 0.3–1.2)

## 2018-12-10 LAB — LIPASE, BLOOD: Lipase: 41 U/L (ref 11–51)

## 2018-12-10 MED ORDER — ACETAMINOPHEN 325 MG PO TABS
650.0000 mg | ORAL_TABLET | Freq: Once | ORAL | Status: AC
  Administered 2018-12-10: 650 mg via ORAL
  Filled 2018-12-10: qty 2

## 2018-12-10 MED ORDER — ONDANSETRON HCL 4 MG/2ML IJ SOLN
4.0000 mg | Freq: Once | INTRAMUSCULAR | Status: AC
  Administered 2018-12-10: 4 mg via INTRAVENOUS
  Filled 2018-12-10: qty 2

## 2018-12-10 MED ORDER — THIAMINE HCL 100 MG/ML IJ SOLN
Freq: Once | INTRAVENOUS | Status: AC
  Administered 2018-12-10: 19:00:00 via INTRAVENOUS
  Filled 2018-12-10: qty 1000

## 2018-12-10 MED ORDER — ONDANSETRON 4 MG PO TBDP: 4.0000 mg | ORAL_TABLET | Freq: Three times a day (TID) | ORAL | 0 refills | Status: DC | PRN

## 2018-12-10 NOTE — ED Triage Notes (Signed)
Per [facility] pt from motel room for detox from ETOH. Reports last use 2 hours ago. Reports she has been out of her medications for a while now. Denies SI or HI but is depressed.

## 2018-12-10 NOTE — Discharge Instructions (Addendum)
Stop drinking alcohol! Use zofran as directed as needed for nausea. Stay well hydrated. Alternate between tylenol and ibuprofen as needed for pain. Use the list below to help find assistance with your depression and with your alcohol use. Return to the ER for emergent changes or worsening symptoms.

## 2018-12-10 NOTE — ED Provider Notes (Signed)
[facility]-EMERGENCY DEPT Provider Note   CSN: [id number] Arrival date & time: 12/10/18  1617    History   Chief Complaint Chief Complaint  Patient presents with  . detox  . Depression    HPI    [name] is a 63 y.o. female with a PMHx of alcohol abuse, anxiety, depression, and other conditions listed below, who presents to the ED with complaints of being an alcoholic and wanting to detox.  Patient states that she drinks a 12 pack of beer per day, today she had 4 beers with the last set being just prior to arrival.  She reports being depressed but denies SI, HI, or AVH.  Does not want to hurt herself or anybody else.  She reports having intermittent abdominal pain which she describes as 7/10 intermittent shooting nonradiating left-sided abdominal pain that worsens with movement, with no treatments tried prior to arrival.  She also reports associated nausea and 5 episodes of nonbloody nonbilious emesis this morning.  She was seen on 11/26/2018 in the ED for complaints of wanting detox and being depressed, she contracted for safety at that time, she was given refills of Cymbalta, gabapentin, and Vistaril, and given a Librium taper.  She never filled those prescriptions and still has them.  She states she has been off of her medications for the last 2 weeks including Prozac, gabapentin, and Protonix.  Unclear whether she supposed to be on Cymbalta or not.  She does not use drugs and does not smoke anymore.  She denies any fevers, chills, chest pain, shortness of breath, hematemesis, diarrhea, constipation, dysuria, hematuria, numbness, tingling, focal weakness, SI, HI, AVH, or any other complaints at this time.  The history is provided by the patient and medical records. No language interpreter was used.  Depression  Associated symptoms include abdominal pain. Pertinent negatives include no chest pain and no shortness of breath.    Past Medical History:  Diagnosis  Date  . Alcohol dependence (HCC)   . Anxiety   . Chronic pain   . Depression   . Herniated cervical disc     Patient Active Problem List   Diagnosis Date Noted  . Alcohol dependence with alcohol-induced mood disorder (HCC)   . 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 Abdominal pain 09/26/2018  . Alcohol dependence with uncomplicated withdrawal (HCC) 05/16/2018    Past Surgical History:  Procedure Laterality Date  . ABDOMINAL HYSTERECTOMY    . ANKLE SURGERY Right   . CARPAL TUNNEL RELEASE Bilateral   . CERVICAL FUSION    . CHOLECYSTECTOMY    . KNEE SURGERY       OB History   No obstetric history on file.      Home Medications    Prior to Admission medications   Medication Sig Start Date End Date Taking? Authorizing Provider  chlordiazePOXIDE (LIBRIUM) 25 MG capsule 50mg  PO TID x 1D, then 25-50mg  PO BID X 1D, then 25-50mg  PO QD X 1D 11/26/18   Ward, Chase PicketJaime Pilcher, PA-C  DULoxetine (CYMBALTA) 30 MG capsule Take 1 capsule (30 mg total) by mouth daily. For depression 11/26/18   Ward, Chase PicketJaime Pilcher, PA-C  gabapentin (NEURONTIN) 400 MG capsule Take 1 capsule (400 mg total) by mouth 3 (three) times daily. For agitation 11/26/18   Ward, Chase PicketJaime Pilcher, PA-C  hydrOXYzine (ATARAX/VISTARIL) 25 MG tablet Take 1 tablet (25 mg total) by  mouth every 6 (six) hours as needed. For anxiety 11/26/18   Ward, Chase Picket, PA-C  metoprolol succinate (TOPROL-XL) 25 MG 24 hr tablet Take 1 tablet (25 mg total) by mouth daily. For blood pressure 10/14/18   Aldean Baker, NP  nicotine (NICODERM CQ - DOSED IN MG/24 HOURS) 21 mg/24hr patch Place 1 patch (21 mg total) onto the skin daily. (May buy from over the counter): Smoking cessation 10/14/18   Armandina Stammer I, NP  pantoprazole (PROTONIX) 20 MG tablet Take 1 tablet (20 mg total) by mouth daily. For acid reflux 10/14/18    Armandina Stammer I, NP    Family History No family history on file.  Social History Social History   Tobacco Use  . Smoking status: Current Every Day Smoker    Packs/day: 0.50    Years: 7.00    Pack years: 3.50    Types: Cigarettes  . Smokeless tobacco: Never Used  Substance Use Topics  . Alcohol use: Yes    Alcohol/week: 3.0 standard drinks    Types: 3 Standard drinks or equivalent per week    Comment: Daily use ''alcohol'' -- BAAC was 302  . Drug use: Yes    Types: Marijuana     Allergies   Ketorolac and Tramadol   Review of Systems Review of Systems  Constitutional: Negative for chills and fever.  Respiratory: Negative for shortness of breath.   Cardiovascular: Negative for chest pain.  Gastrointestinal: Positive for abdominal pain, nausea and vomiting. Negative for constipation and diarrhea.  Genitourinary: Negative for dysuria and hematuria.  Musculoskeletal: Negative for arthralgias and myalgias.  Skin: Negative for color change.  Allergic/Immunologic: Negative for immunocompromised state.  Neurological: Negative for weakness and numbness.  Psychiatric/Behavioral: Positive for depression. Negative for confusion, hallucinations and suicidal ideas.   All other systems reviewed and are negative for acute change except as noted in the HPI.    Physical Exam Updated Vital Signs BP (!) 139/99 (BP Location: Right Arm)   Pulse 86   Temp 97.9 F (36.6 C) (Oral)   Resp 19   SpO2 95%   Physical Exam Vitals signs and nursing note reviewed.  Constitutional:      General: She is not in acute distress.    Appearance: Normal appearance. She is well-developed. She is not toxic-appearing.     Comments: Afebrile, nontoxic, NAD  HENT:     Head: Normocephalic and atraumatic.  Eyes:     General:        Right eye: No discharge.        Left eye: No discharge.     Conjunctiva/sclera: Conjunctivae normal.  Neck:     Musculoskeletal: Normal range of motion and neck supple.    Cardiovascular:     Rate and Rhythm: Normal rate and regular rhythm.     Pulses: Normal pulses.     Heart sounds: Normal heart sounds, S1 normal and S2 normal. No murmur. No friction rub. No gallop.   Pulmonary:     Effort: Pulmonary effort is normal. No respiratory distress.     Breath sounds: Normal breath sounds. No decreased breath sounds, wheezing, rhonchi or rales.  Abdominal:     General: Bowel sounds are normal. There is no distension.     Palpations: Abdomen is soft. Abdomen is not rigid.     Tenderness: There is abdominal tenderness in the left upper quadrant and left lower quadrant. There is no right CVA tenderness, left CVA tenderness, guarding or rebound. Negative signs  include Murphy's sign and McBurney's sign.     Comments: Soft, nondistended, +BS throughout, with mild L lateral abd TTP, no r/g/r, neg murphy's, neg mcburney's, no CVA TTP   Musculoskeletal: Normal range of motion.  Skin:    General: Skin is warm and dry.     Findings: No rash.  Neurological:     Mental Status: She is alert and oriented to person, place, and time.     Sensory: Sensation is intact. No sensory deficit.     Motor: Motor function is intact.  Psychiatric:        Attention and Perception: She does not perceive auditory or visual hallucinations.        Mood and Affect: Affect normal. Mood is depressed.        Behavior: Behavior normal.        Thought Content: Thought content does not include homicidal or suicidal ideation. Thought content does not include homicidal or suicidal plan.     Comments: Depressed mood but denies SI/HI/AVH      ED Treatments / Results  Labs (all labs ordered are listed, but only abnormal results are displayed) Labs Reviewed  CBC WITH DIFFERENTIAL/PLATELET - Abnormal; Notable for the following components:      Result Value   WBC 3.9 (*)    MCV 103.8 (*)    Platelets 144 (*)    Neutro Abs 1.6 (*)    All other components within normal limits  COMPREHENSIVE  METABOLIC PANEL - Abnormal; Notable for the following components:   CO2 19 (*)    Calcium 8.5 (*)    AST 223 (*)    ALT 107 (*)    Anion gap 17 (*)    All other components within normal limits  URINALYSIS, ROUTINE W REFLEX MICROSCOPIC - Abnormal; Notable for the following components:   Hgb urine dipstick SMALL (*)    Ketones, ur 20 (*)    Bacteria, UA RARE (*)    All other components within normal limits  LIPASE, BLOOD    EKG None  Radiology No results found.  Procedures Procedures (including critical care time)  Medications Ordered in ED Medications  ondansetron (ZOFRAN) injection 4 mg (4 mg Intravenous Given 12/10/18 1922)  sodium chloride 0.9 % 1,000 mL with thiamine 100 mg, folic acid 1 mg, multivitamins adult 10 mL infusion ( Intravenous New Bag/Given 12/10/18 1922)  acetaminophen (TYLENOL) tablet 650 mg (650 mg Oral Given 12/10/18 1909)     Initial Impression / Assessment and Plan / ED Course  I have reviewed the triage vital signs and the nursing notes.  Pertinent labs & imaging results that were available during my care of the patient were reviewed by me and considered in my medical decision making (see chart for details).        63 y.o. female here with alcohol abuse, depression, nausea, vomiting, and abdominal pain.  She reports that she wants to stop drinking.  Was seen here about 2 weeks ago for very similar complaint and was given Librium as well as refills of Cymbalta and Vistaril and gabapentin, but has not filled them.  On exam, mild abdominal tenderness mostly on the left side, non-peritoneal, otherwise well-appearing.  She denies SI, HI, AVH.  Will get labs but doubt need for imaging, suspect abdominal pain is from alcohol abuse and potentially withdrawal.  She is not tremulous and does not appear to be acutely withdrawing.  She contracts for safety at this time.  Will give banana bag  of fluids as well as Zofran and tylenol and reassess shortly.  9:28 PM CBC  w/diff with mild thrombocytopenia likely from alcohol abuse, otherwise fairly unremarkable. CMP with bicarb 19, AST 223, ALT 107 both likely from alcohol abuse, anion gap mildly elevated at 17 likely from alcohol use. Lipase WNL. U/A without evidence of UTI. Pt feeling better and tolerating PO well. Doubt need for further emergent work up or intervention. Long discussion had with pt regarding importance of alcohol cessation. Will send home with zofran. Use home meds as directed. Outpatient resources given. OTC remedies advised. F/up with resources for help with alcohol use and depression. Pt contracts for safety, doubt need for TTS consultation. I explained the diagnosis and have given explicit precautions to return to the ER including for any other new or worsening symptoms. The patient understands and accepts the medical plan as it's been dictated and I have answered their questions. Discharge instructions concerning home care and prescriptions have been given. The patient is STABLE and is discharged to home in good condition.    Final Clinical Impressions(s) / ED Diagnoses   Final diagnoses:  Alcohol abuse  Left lateral abdominal pain  Nausea and vomiting in adult patient  Depression, unspecified depression type    ED Discharge Orders         Ordered    ondansetron (ZOFRAN ODT) 4 MG disintegrating tablet  Every 8 hours PRN     12/10/18 [address] [place], [place] 12/10/18 2128    Shaune Pollack, MD 12/14/18 0005

## 2018-12-11 ENCOUNTER — Other Ambulatory Visit: Payer: Self-pay

## 2018-12-11 ENCOUNTER — Inpatient Hospital Stay (HOSPITAL_COMMUNITY)
Admission: EM | Admit: 2018-12-11 | Discharge: 2018-12-15 | DRG: 083 | Disposition: A | Discharge status: Home / Self Care | Payer: Medicaid Other | Attending: Internal Medicine | Admitting: Internal Medicine

## 2018-12-11 ENCOUNTER — Emergency Department (HOSPITAL_COMMUNITY): Payer: Medicaid Other

## 2018-12-11 ENCOUNTER — Encounter (HOSPITAL_COMMUNITY): Payer: Self-pay

## 2018-12-11 DIAGNOSIS — W19XXXA Unspecified fall, initial encounter: Secondary | ICD-10-CM | POA: Diagnosis present

## 2018-12-11 DIAGNOSIS — F329 Major depressive disorder, single episode, unspecified: Secondary | ICD-10-CM | POA: Diagnosis present

## 2018-12-11 DIAGNOSIS — S066X9A Traumatic subarachnoid hemorrhage with loss of consciousness of unspecified duration, initial encounter: Secondary | ICD-10-CM | POA: Diagnosis present

## 2018-12-11 DIAGNOSIS — G4089 Other seizures: Secondary | ICD-10-CM | POA: Diagnosis present

## 2018-12-11 DIAGNOSIS — F10231 Alcohol dependence with withdrawal delirium: Secondary | ICD-10-CM | POA: Diagnosis present

## 2018-12-11 DIAGNOSIS — Z9114 Patient's other noncompliance with medication regimen: Secondary | ICD-10-CM | POA: Diagnosis not present

## 2018-12-11 DIAGNOSIS — F17210 Nicotine dependence, cigarettes, uncomplicated: Secondary | ICD-10-CM | POA: Diagnosis present

## 2018-12-11 DIAGNOSIS — G629 Polyneuropathy, unspecified: Secondary | ICD-10-CM

## 2018-12-11 DIAGNOSIS — F32A Depression, unspecified: Secondary | ICD-10-CM

## 2018-12-11 DIAGNOSIS — D6959 Other secondary thrombocytopenia: Secondary | ICD-10-CM | POA: Diagnosis present

## 2018-12-11 DIAGNOSIS — H1130 Conjunctival hemorrhage, unspecified eye: Secondary | ICD-10-CM | POA: Diagnosis present

## 2018-12-11 DIAGNOSIS — E860 Dehydration: Secondary | ICD-10-CM | POA: Diagnosis present

## 2018-12-11 DIAGNOSIS — F10230 Alcohol dependence with withdrawal, uncomplicated: Secondary | ICD-10-CM | POA: Diagnosis present

## 2018-12-11 DIAGNOSIS — I609 Nontraumatic subarachnoid hemorrhage, unspecified: Secondary | ICD-10-CM

## 2018-12-11 DIAGNOSIS — Y9289 Other specified places as the place of occurrence of the external cause: Secondary | ICD-10-CM | POA: Diagnosis not present

## 2018-12-11 DIAGNOSIS — E871 Hypo-osmolality and hyponatremia: Secondary | ICD-10-CM | POA: Diagnosis present

## 2018-12-11 DIAGNOSIS — S062X9A Diffuse traumatic brain injury with loss of consciousness of unspecified duration, initial encounter: Secondary | ICD-10-CM | POA: Diagnosis present

## 2018-12-11 DIAGNOSIS — F10939 Alcohol use, unspecified with withdrawal, unspecified: Secondary | ICD-10-CM

## 2018-12-11 DIAGNOSIS — F1020 Alcohol dependence, uncomplicated: Secondary | ICD-10-CM | POA: Diagnosis present

## 2018-12-11 DIAGNOSIS — K7010 Alcoholic hepatitis without ascites: Secondary | ICD-10-CM | POA: Diagnosis present

## 2018-12-11 DIAGNOSIS — G8929 Other chronic pain: Secondary | ICD-10-CM | POA: Diagnosis present

## 2018-12-11 DIAGNOSIS — Z5900 Homelessness unspecified: Secondary | ICD-10-CM

## 2018-12-11 DIAGNOSIS — D696 Thrombocytopenia, unspecified: Secondary | ICD-10-CM

## 2018-12-11 DIAGNOSIS — R569 Unspecified convulsions: Secondary | ICD-10-CM | POA: Diagnosis not present

## 2018-12-11 DIAGNOSIS — F10931 Alcohol use, unspecified with withdrawal delirium: Secondary | ICD-10-CM

## 2018-12-11 DIAGNOSIS — R109 Unspecified abdominal pain: Secondary | ICD-10-CM | POA: Diagnosis present

## 2018-12-11 DIAGNOSIS — F10239 Alcohol dependence with withdrawal, unspecified: Secondary | ICD-10-CM

## 2018-12-11 LAB — CBC WITH DIFFERENTIAL/PLATELET
Abs Immature Granulocytes: 0.04 10*3/uL (ref 0.00–0.07)
BASOS PCT: 1 %
Basophils Absolute: 0 10*3/uL (ref 0.0–0.1)
EOS ABS: 0 10*3/uL (ref 0.0–0.5)
Eosinophils Relative: 0 %
HCT: 37 % (ref 36.0–46.0)
Hemoglobin: 12.3 g/dL (ref 12.0–15.0)
Immature Granulocytes: 1 %
Lymphocytes Relative: 12 %
Lymphs Abs: 0.7 10*3/uL (ref 0.7–4.0)
MCH: 33.5 pg (ref 26.0–34.0)
MCHC: 33.2 g/dL (ref 30.0–36.0)
MCV: 100.8 fL — ABNORMAL HIGH (ref 80.0–100.0)
Monocytes Absolute: 0.3 10*3/uL (ref 0.1–1.0)
Monocytes Relative: 6 %
Neutro Abs: 4.7 10*3/uL (ref 1.7–7.7)
Neutrophils Relative %: 80 %
Platelets: 136 10*3/uL — ABNORMAL LOW (ref 150–400)
RBC: 3.67 MIL/uL — ABNORMAL LOW (ref 3.87–5.11)
RDW: 13.7 % (ref 11.5–15.5)
WBC: 5.8 10*3/uL (ref 4.0–10.5)
nRBC: 0 % (ref 0.0–0.2)

## 2018-12-11 LAB — ETHANOL: Alcohol, Ethyl (B): <10 mg/dL (ref <10)

## 2018-12-11 LAB — URINALYSIS, ROUTINE W REFLEX MICROSCOPIC
BACTERIA UA: NONE SEEN
Bilirubin Urine: NEGATIVE
GLUCOSE, UA: NEGATIVE mg/dL
Ketones, ur: 20 mg/dL — AB
Leukocytes,Ua: NEGATIVE
Nitrite: NEGATIVE
PH: 7 (ref 5.0–8.0)
PROTEIN: 30 mg/dL — AB
Specific Gravity, Urine: 1.012 (ref 1.005–1.030)

## 2018-12-11 LAB — COMPREHENSIVE METABOLIC PANEL
ALT: 88 U/L — ABNORMAL HIGH (ref 0–44)
AST: 147 U/L — ABNORMAL HIGH (ref 15–41)
Albumin: 4.4 g/dL (ref 3.5–5.0)
Alkaline Phosphatase: 69 U/L (ref 38–126)
Anion gap: 12 (ref 5–15)
BUN: 6 mg/dL — ABNORMAL LOW (ref 8–23)
CO2: 25 mmol/L (ref 22–32)
Calcium: 9 mg/dL (ref 8.9–10.3)
Chloride: 96 mmol/L — ABNORMAL LOW (ref 98–111)
Creatinine, Ser: 0.6 mg/dL (ref 0.44–1.00)
GFR calc non Af Amer: >60 mL/min (ref >60)
Glucose, Bld: 133 mg/dL — ABNORMAL HIGH (ref 70–99)
Potassium: 3.6 mmol/L (ref 3.5–5.1)
Sodium: 133 mmol/L — ABNORMAL LOW (ref 135–145)
TOTAL PROTEIN: 7.1 g/dL (ref 6.5–8.1)
Total Bilirubin: 1.5 mg/dL — ABNORMAL HIGH (ref 0.3–1.2)

## 2018-12-11 LAB — RAPID URINE DRUG SCREEN, HOSP PERFORMED
AMPHETAMINES: NOT DETECTED
Barbiturates: NOT DETECTED
Benzodiazepines: NOT DETECTED
Cocaine: NOT DETECTED
Opiates: NOT DETECTED
Tetrahydrocannabinol: NOT DETECTED

## 2018-12-11 LAB — MRSA PCR SCREENING: MRSA by PCR: NEGATIVE

## 2018-12-11 LAB — CBG MONITORING, ED: GLUCOSE-CAPILLARY: 141 mg/dL — AB (ref 70–99)

## 2018-12-11 LAB — MAGNESIUM: Magnesium: 1.9 mg/dL (ref 1.7–2.4)

## 2018-12-11 MED ORDER — IBUPROFEN 800 MG PO TABS
800.0000 mg | ORAL_TABLET | Freq: Once | ORAL | Status: AC
  Administered 2018-12-11: 800 mg via ORAL
  Filled 2018-12-11: qty 1

## 2018-12-11 MED ORDER — ADULT MULTIVITAMIN W/MINERALS CH
1.0000 | ORAL_TABLET | Freq: Every day | ORAL | Status: DC
  Administered 2018-12-12 – 2018-12-15 (×4): 1 via ORAL
  Filled 2018-12-11 (×4): qty 1

## 2018-12-11 MED ORDER — THIAMINE HCL 100 MG/ML IJ SOLN: 100.0000 mg | Freq: Every day | INTRAMUSCULAR | Status: DC

## 2018-12-11 MED ORDER — OXYCODONE HCL 5 MG PO TABS
5.0000 mg | ORAL_TABLET | Freq: Once | ORAL | Status: AC
  Administered 2018-12-11: 5 mg via ORAL
  Filled 2018-12-11: qty 1

## 2018-12-11 MED ORDER — FOLIC ACID 1 MG PO TABS
1.0000 mg | ORAL_TABLET | Freq: Every day | ORAL | Status: DC
  Administered 2018-12-12 – 2018-12-15 (×4): 1 mg via ORAL
  Filled 2018-12-11 (×4): qty 1

## 2018-12-11 MED ORDER — LORAZEPAM 2 MG/ML IJ SOLN: 1.0000 mg | Freq: Four times a day (QID) | INTRAMUSCULAR | Status: AC | PRN

## 2018-12-11 MED ORDER — PANTOPRAZOLE SODIUM 40 MG PO TBEC
40.0000 mg | DELAYED_RELEASE_TABLET | Freq: Every day | ORAL | Status: DC
  Administered 2018-12-11 – 2018-12-15 (×5): 40 mg via ORAL
  Filled 2018-12-11 (×5): qty 1

## 2018-12-11 MED ORDER — ONDANSETRON 4 MG PO TBDP
4.0000 mg | ORAL_TABLET | Freq: Three times a day (TID) | ORAL | Status: DC | PRN
  Administered 2018-12-11 – 2018-12-15 (×7): 4 mg via ORAL
  Filled 2018-12-11 (×7): qty 1

## 2018-12-11 MED ORDER — VITAMIN B-1 100 MG PO TABS
100.0000 mg | ORAL_TABLET | Freq: Every day | ORAL | Status: DC
  Administered 2018-12-12 – 2018-12-15 (×4): 100 mg via ORAL
  Filled 2018-12-11 (×4): qty 1

## 2018-12-11 MED ORDER — ORAL CARE MOUTH RINSE
15.0000 mL | Freq: Two times a day (BID) | OROMUCOSAL | Status: DC
  Administered 2018-12-11 – 2018-12-14 (×5): 15 mL via OROMUCOSAL

## 2018-12-11 MED ORDER — LORAZEPAM 1 MG PO TABS
1.0000 mg | ORAL_TABLET | Freq: Four times a day (QID) | ORAL | Status: AC | PRN
  Administered 2018-12-12 – 2018-12-13 (×4): 1 mg via ORAL
  Filled 2018-12-11 (×4): qty 1

## 2018-12-11 MED ORDER — THIAMINE HCL 100 MG/ML IJ SOLN: Freq: Once | INTRAVENOUS | Status: DC

## 2018-12-11 MED ORDER — THIAMINE HCL 100 MG/ML IJ SOLN
Freq: Once | INTRAVENOUS | Status: AC
  Administered 2018-12-11: 17:00:00 via INTRAVENOUS
  Filled 2018-12-11: qty 1000

## 2018-12-11 MED ORDER — LEVETIRACETAM 500 MG PO TABS
500.0000 mg | ORAL_TABLET | Freq: Two times a day (BID) | ORAL | Status: DC
  Administered 2018-12-11 – 2018-12-15 (×9): 500 mg via ORAL
  Filled 2018-12-11 (×2): qty 1
  Filled 2018-12-11: qty 2
  Filled 2018-12-11 (×5): qty 1

## 2018-12-11 MED ORDER — LACTATED RINGERS IV SOLN
INTRAVENOUS | Status: DC
  Administered 2018-12-11: 21:00:00 via INTRAVENOUS

## 2018-12-11 NOTE — ED Triage Notes (Signed)
[place] witnessed seizure with fall face first, visible hematoma and facial abrasions. Hx of seizures.  BP 192/100 HR 102 RR 18 Sp02 92 CBG 1870

## 2018-12-11 NOTE — ED Notes (Signed)
EDEKG taken but not printed 

## 2018-12-11 NOTE — ED Notes (Signed)
ED TO INPATIENT HANDOFF REPORT  Name/Age/Gender [name] 63 y.o. female  Code Status Code Status History    Date Active Date Inactive Code Status Order ID Comments User Context   11/02/2018 0538 11/02/2018 1231 Full Code [id number]  Devoria Albe, MD ED   10/21/2018 2330 10/22/2018 0906 Full Code [id number]  Arby Barrette, MD ED   10/12/2018 1408 10/14/2018 1900 Full Code [id number]  Charm Rings, NP Inpatient   10/12/2018 1408 10/12/2018 1408 Full Code [id number]  Charm Rings, NP Inpatient   10/12/2018 0859 10/12/2018 1350 Full Code [id number]  Charm Rings, NP ED   09/26/2018 1617 09/28/2018 2144 Full Code [id number]  Marvel Plan, PA-C ED   05/16/2018 1214 05/16/2018 2019 Full Code [id number]  Charm Rings, NP ED      Home/SNF/Other Home  Chief Complaint seizure; fall  Level of Care/Admitting Diagnosis ED Disposition    ED Disposition Condition Comment   Admit  Hospital Area: [facility] [[id number]]  Level of Care: Stepdown [14]  Admit to SDU based on following criteria: Severe physiological/psychological symptoms:  Any diagnosis requiring assessment & intervention at least every 4 hours on an ongoing basis to obtain desired patient outcomes including stability and rehabilitation  Diagnosis: Subarachnoid hemorrhage (HCC) [430.ICD-9-CM]  Admitting Physician: Rometta Emery [[id number]]  Attending Physician: Rometta Emery [[id number]]  Estimated length of stay: past midnight tomorrow  Certification:: I certify this patient will need inpatient services for at least 2 midnights  PT Class (Do Not Modify): Inpatient [101]  PT Acc Code (Do Not Modify): Private [1]       Medical History Past Medical History:  Diagnosis Date  . Alcohol dependence (HCC)   . Anxiety   . Chronic pain   . Depression   . Herniated cervical disc     Allergies Allergies  Allergen Reactions  . Ketorolac Hives  . Tramadol Hives    IV Location/Drains/Wounds Patient  Lines/Drains/Airways Status   Active Line/Drains/Airways    Name:   Placement date:   Placement time:   Site:   Days:   Peripheral IV 12/11/18 Right Wrist   12/11/18    1549    Wrist   less than 1          Labs/Imaging Results for orders placed or performed during the hospital encounter of 12/11/18 (from the past 48 hour(s))  CBC with Differential     Status: Abnormal   Collection Time: 12/11/18  3:23 PM  Result Value Ref Range   WBC 5.8 4.0 - 10.5 K/uL   RBC 3.67 (L) 3.87 - 5.11 MIL/uL   Hemoglobin 12.3 12.0 - 15.0 g/dL   HCT 01.6 01.0 - 93.2 %   MCV 100.8 (H) 80.0 - 100.0 fL   MCH 33.5 26.0 - 34.0 pg   MCHC 33.2 30.0 - 36.0 g/dL   RDW 35.5 73.2 - 20.2 %   Platelets 136 (L) 150 - 400 K/uL   nRBC 0.0 0.0 - 0.2 %   Neutrophils Relative % 80 %   Neutro Abs 4.7 1.7 - 7.7 K/uL   Lymphocytes Relative 12 %   Lymphs Abs 0.7 0.7 - 4.0 K/uL   Monocytes Relative 6 %   Monocytes Absolute 0.3 0.1 - 1.0 K/uL   Eosinophils Relative 0 %   Eosinophils Absolute 0.0 0.0 - 0.5 K/uL   Basophils Relative 1 %   Basophils Absolute 0.0 0.0 - 0.1 K/uL   Immature Granulocytes 1 %  Abs Immature Granulocytes 0.04 0.00 - 0.07 K/uL    Comment: Performed at [facility], [address] [place], [place] [address]  Comprehensive metabolic panel     Status: Abnormal   Collection Time: 12/11/18  3:23 PM  Result Value Ref Range   Sodium 133 (L) 135 - 145 mmol/L   Potassium 3.6 3.5 - 5.1 mmol/L   Chloride 96 (L) 98 - 111 mmol/L   CO2 25 22 - 32 mmol/L   Glucose, Bld 133 (H) 70 - 99 mg/dL   BUN 6 (L) 8 - 23 mg/dL   Creatinine, Ser 6.040.60 0.44 - 1.00 mg/dL   Calcium 9.0 8.9 - 54.010.3 mg/dL   Total Protein 7.1 6.5 - 8.1 g/dL   Albumin 4.4 3.5 - 5.0 g/dL   AST 981147 (H) 15 - 41 U/L   ALT 88 (H) 0 - 44 U/L   Alkaline Phosphatase 69 38 - 126 U/L   Total Bilirubin 1.5 (H) 0.3 - 1.2 mg/dL   GFR calc non Af Amer >60 >60 mL/min   GFR calc Af Amer >60 >60 mL/min   Anion gap 12 5 - 15     Comment: Performed at [facility], [address] [place], [place] [address]  Magnesium     Status: None   Collection Time: 12/11/18  3:23 PM  Result Value Ref Range   Magnesium 1.9 1.7 - 2.4 mg/dL    Comment: Performed at [facility], [address] [place], [place] [address]  Ethanol     Status: None   Collection Time: 12/11/18  3:24 PM  Result Value Ref Range   Alcohol, Ethyl (B) <10 <10 mg/dL    Comment: (NOTE) Lowest detectable limit for serum alcohol is 10 mg/dL. For medical purposes only. Performed at [facility], [address] [place], [place] [address]   CBG monitoring, ED     Status: Abnormal   Collection Time: 12/11/18  3:50 PM  Result Value Ref Range   Glucose-Capillary 141 (H) 70 - 99 mg/dL   Dg Chest 2 View  Result Date: 12/11/2018 CLINICAL DATA:  Fall.  Seizure. EXAM: CHEST - 2 VIEW COMPARISON:  10/11/2018 FINDINGS: The lungs are clear without focal pneumonia, edema, pneumothorax or pleural effusion. Interstitial markings are diffusely coarsened with chronic features. The cardiopericardial silhouette is within normal limits for size. The visualized bony structures of the thorax are intact. IMPRESSION: No active cardiopulmonary disease. Electronically Signed   By: Kennith CenterEric  Mansell M.D.   On: 12/11/2018 16:38   Ct Head Wo Contrast  Result Date: 12/11/2018 CLINICAL DATA:  Status post seizure today with a fall and blow to the face. Facial abrasions. Initial encounter. EXAM: CT HEAD WITHOUT CONTRAST CT MAXILLOFACIAL WITHOUT CONTRAST CT CERVICAL SPINE WITHOUT CONTRAST TECHNIQUE: Multidetector CT imaging of the head, cervical spine, and maxillofacial structures were performed using the standard protocol without intravenous contrast. Multiplanar CT image reconstructions of the cervical spine and maxillofacial structures were also generated. COMPARISON:  Head and cervical spine CT scans 11/14/2018. FINDINGS: CT HEAD  FINDINGS Brain: A small parenchymal contusion and small amount of subarachnoid hemorrhage are seen in the paramedian left frontal lobe. No other acute abnormality including subdural hemorrhage, infarct, mass lesion, intraventricular hemorrhage, midline shift or hydrocephalus. Cortical atrophy and chronic microvascular ischemic change noted. Vascular: No hyperdense vessel or unexpected calcification. Skull: Intact.  No focal lesion. Other: None. CT MAXILLOFACIAL FINDINGS Osseous: No fracture or mandibular dislocation. No destructive process.  Orbits: The globes are intact and lenses are located. Orbital fat is clear. There is some preseptal hematoma about the periphery of the left eye. Sinuses: Clear. Soft tissues: Soft tissue contusion is seen over the scratch the soft tissue contusion on the left side of the head and forehead is identified. CT CERVICAL SPINE FINDINGS Alignment: Maintained with straightening of lordosis noted. Skull base and vertebrae: No acute fracture. No primary bone lesion or focal pathologic process. Soft tissues and spinal canal: No prevertebral fluid or swelling. No visible canal hematoma. Disc levels: The patient is status post C4-7 discectomy and fusion. As on the prior exam, no osseous fusion across the C4-5 disc interspace is identified. Osseous fusion is seen across C5-6 and C6-7. Hardware is intact. Upper chest: Lung apices are clear. Other: None. IMPRESSION: Small parenchymal contusion in the left frontal lobe with associated small volume of subarachnoid hemorrhage. Negative for midline shift or hydrocephalus. No other acute intracranial normality. Soft tissue contusions about the left side of the head and face without facial bone fracture. No acute abnormality cervical spine. Atrophy and chronic microvascular ischemic change. Status post C4-7 ACDF. Critical Value/emergent results were called by telephone at the time of interpretation on 12/11/2018 at 4:37 pm to [facility] who  verbally acknowledged these results. Electronically Signed   By: Drusilla Kanner M.D.   On: 12/11/2018 16:43   Ct Cervical Spine Wo Contrast  Result Date: 12/11/2018 CLINICAL DATA:  Status post seizure today with a fall and blow to the face. Facial abrasions. Initial encounter. EXAM: CT HEAD WITHOUT CONTRAST CT MAXILLOFACIAL WITHOUT CONTRAST CT CERVICAL SPINE WITHOUT CONTRAST TECHNIQUE: Multidetector CT imaging of the head, cervical spine, and maxillofacial structures were performed using the standard protocol without intravenous contrast. Multiplanar CT image reconstructions of the cervical spine and maxillofacial structures were also generated. COMPARISON:  Head and cervical spine CT scans 11/14/2018. FINDINGS: CT HEAD FINDINGS Brain: A small parenchymal contusion and small amount of subarachnoid hemorrhage are seen in the paramedian left frontal lobe. No other acute abnormality including subdural hemorrhage, infarct, mass lesion, intraventricular hemorrhage, midline shift or hydrocephalus. Cortical atrophy and chronic microvascular ischemic change noted. Vascular: No hyperdense vessel or unexpected calcification. Skull: Intact.  No focal lesion. Other: None. CT MAXILLOFACIAL FINDINGS Osseous: No fracture or mandibular dislocation. No destructive process. Orbits: The globes are intact and lenses are located. Orbital fat is clear. There is some preseptal hematoma about the periphery of the left eye. Sinuses: Clear. Soft tissues: Soft tissue contusion is seen over the scratch the soft tissue contusion on the left side of the head and forehead is identified. CT CERVICAL SPINE FINDINGS Alignment: Maintained with straightening of lordosis noted. Skull base and vertebrae: No acute fracture. No primary bone lesion or focal pathologic process. Soft tissues and spinal canal: No prevertebral fluid or swelling. No visible canal hematoma. Disc levels: The patient is status post C4-7 discectomy and fusion. As on the prior  exam, no osseous fusion across the C4-5 disc interspace is identified. Osseous fusion is seen across C5-6 and C6-7. Hardware is intact. Upper chest: Lung apices are clear. Other: None. IMPRESSION: Small parenchymal contusion in the left frontal lobe with associated small volume of subarachnoid hemorrhage. Negative for midline shift or hydrocephalus. No other acute intracranial normality. Soft tissue contusions about the left side of the head and face without facial bone fracture. No acute abnormality cervical spine. Atrophy and chronic microvascular ischemic change. Status post C4-7 ACDF. Critical Value/emergent results were called by  telephone at the time of interpretation on 12/11/2018 at 4:37 pm to [facility] who verbally acknowledged these results. Electronically Signed   By: Drusilla Kanner M.D.   On: 12/11/2018 16:43   Ct Maxillofacial Wo Contrast  Result Date: 12/11/2018 CLINICAL DATA:  Status post seizure today with a fall and blow to the face. Facial abrasions. Initial encounter. EXAM: CT HEAD WITHOUT CONTRAST CT MAXILLOFACIAL WITHOUT CONTRAST CT CERVICAL SPINE WITHOUT CONTRAST TECHNIQUE: Multidetector CT imaging of the head, cervical spine, and maxillofacial structures were performed using the standard protocol without intravenous contrast. Multiplanar CT image reconstructions of the cervical spine and maxillofacial structures were also generated. COMPARISON:  Head and cervical spine CT scans 11/14/2018. FINDINGS: CT HEAD FINDINGS Brain: A small parenchymal contusion and small amount of subarachnoid hemorrhage are seen in the paramedian left frontal lobe. No other acute abnormality including subdural hemorrhage, infarct, mass lesion, intraventricular hemorrhage, midline shift or hydrocephalus. Cortical atrophy and chronic microvascular ischemic change noted. Vascular: No hyperdense vessel or unexpected calcification. Skull: Intact.  No focal lesion. Other: None. CT MAXILLOFACIAL FINDINGS  Osseous: No fracture or mandibular dislocation. No destructive process. Orbits: The globes are intact and lenses are located. Orbital fat is clear. There is some preseptal hematoma about the periphery of the left eye. Sinuses: Clear. Soft tissues: Soft tissue contusion is seen over the scratch the soft tissue contusion on the left side of the head and forehead is identified. CT CERVICAL SPINE FINDINGS Alignment: Maintained with straightening of lordosis noted. Skull base and vertebrae: No acute fracture. No primary bone lesion or focal pathologic process. Soft tissues and spinal canal: No prevertebral fluid or swelling. No visible canal hematoma. Disc levels: The patient is status post C4-7 discectomy and fusion. As on the prior exam, no osseous fusion across the C4-5 disc interspace is identified. Osseous fusion is seen across C5-6 and C6-7. Hardware is intact. Upper chest: Lung apices are clear. Other: None. IMPRESSION: Small parenchymal contusion in the left frontal lobe with associated small volume of subarachnoid hemorrhage. Negative for midline shift or hydrocephalus. No other acute intracranial normality. Soft tissue contusions about the left side of the head and face without facial bone fracture. No acute abnormality cervical spine. Atrophy and chronic microvascular ischemic change. Status post C4-7 ACDF. Critical Value/emergent results were called by telephone at the time of interpretation on 12/11/2018 at 4:37 pm to [facility] who verbally acknowledged these results. Electronically Signed   By: Drusilla Kanner M.D.   On: 12/11/2018 16:43   None  Pending Labs Unresulted Labs (From admission, onward)    Start     Ordered   12/11/18 1524  Urine rapid drug screen (hosp performed)  ONCE - STAT,   STAT     12/11/18 1524   12/11/18 1523  Urinalysis, Routine w reflex microscopic  (ED ALOC)  Once,   STAT     12/11/18 1524   Signed and Held  HIV antibody (Routine Testing)  Once,   R     Signed  and Held   Signed and Held  Comprehensive metabolic panel  Tomorrow morning,   R     Signed and Held   Signed and Held  CBC  Tomorrow morning,   R     Signed and Held          Vitals/Pain Today's Vitals   12/11/18 1550 12/11/18 1748 12/11/18 1800 12/11/18 1830  BP: 135/81 136/80 136/77 136/73  Pulse: 85 91 86 86  Resp:  Temp:      TempSrc:      SpO2: 100% 97% 91% 99%  PainSc:        Isolation Precautions No active isolations  Medications Medications  levETIRAcetam (KEPPRA) tablet 500 mg (has no administration in time range)  sodium chloride 0.9 % 1,000 mL with thiamine 100 mg, folic acid 1 mg, multivitamins adult 10 mL infusion ( Intravenous New Bag/Given 12/11/18 1637)  ibuprofen (ADVIL,MOTRIN) tablet 800 mg (800 mg Oral Given 12/11/18 1637)    Mobility Walks

## 2018-12-11 NOTE — ED Provider Notes (Signed)
[facility]-EMERGENCY DEPT Provider Note   CSN: [id number] Arrival date & time: 12/11/18  1338    History   Chief Complaint Chief Complaint  Patient presents with  . Seizures  . Fall    HPI [name] is a 63 y.o. female.     63 y/o F with PMH alcohol dependence with withdrawal seizures and DTS, anxiety, depression, chronic pain who presents to the ED via EMS after having a seizure. Patient has no recollection of the vent. Is currently alert and oriented but states the last thing she remembers was waiting to go and get her prescriptions filled. Per EMS, she had a witnessed seizure at the bus stop and fell, hitting her head on the cement. Patient reports she has not been taking any medication for weeks and was trying to quit alcohol on her own. Last drink 2 days ago per her recollection. Reports n/v the last few days and only tolerating water. No hemetemesis or melena. She is supposed to be taking Prozac, protonix, gabapentin but has not been. She was seen 11/26/2018 and given a taper for librium which she never filled. She was seen yesterday in ED for wanting detox. She was stable at that time and sent home and advised to fill her prescriptions and take them to help with w/d. She stated she was going to get them today but had not gotten them yet.      Past Medical History:  Diagnosis Date  . Alcohol dependence (HCC)   . Anxiety   . Chronic pain   . Depression   . Herniated cervical disc     Patient Active Problem List   Diagnosis Date Noted  . Subarachnoid hemorrhage (HCC) 12/11/2018  . Seizure due to alcohol withdrawal (HCC) 12/11/2018  . Alcohol dependence with alcohol-induced mood disorder (HCC)   . 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 Abdominal pain 09/26/2018  . Alcohol dependence with uncomplicated  withdrawal (HCC) 05/16/2018    Past Surgical History:  Procedure Laterality Date  . ABDOMINAL HYSTERECTOMY    . ANKLE SURGERY Right   . CARPAL TUNNEL RELEASE Bilateral   . CERVICAL FUSION    . CHOLECYSTECTOMY    . KNEE SURGERY       OB History   No obstetric history on file.      Home Medications    Prior to Admission medications   Medication Sig Start Date End Date Taking? Authorizing Provider  ondansetron (ZOFRAN ODT) 4 MG disintegrating tablet Take 1 tablet (4 mg total) by mouth every 8 (eight) hours as needed for nausea or vomiting. 12/10/18  Yes Street, Archer, PA-C  chlordiazePOXIDE (LIBRIUM) 25 MG capsule  PO TID x 1D, then 25-50mg  PO BID X 1D, then 25-50mg  PO QD X 1D Patient not taking: Reported on 12/10/2018 11/26/18   Ward, Chase Picket, PA-C  DULoxetine (CYMBALTA) 30 MG capsule Take 1 capsule (30 mg total) by mouth daily. For depression Patient not taking: Reported on 12/10/2018 11/26/18   Ward, Chase Picket, PA-C  gabapentin (NEURONTIN) 400 MG capsule Take 1 capsule (400 mg total) by mouth 3 (three) times daily. For agitation Patient not taking: Reported on 12/10/2018 11/26/18   Ward, Chase Picket, PA-C  hydrOXYzine (ATARAX/VISTARIL) 25 MG tablet Take 1 tablet (25 mg total) by mouth every 6 (six) hours as needed. For anxiety Patient not taking: Reported on 12/10/2018 11/26/18  Ward, Chase Picket, PA-C  metoprolol succinate (TOPROL-XL) 25 MG 24 hr tablet Take 1 tablet (25 mg total) by mouth daily. For blood pressure Patient not taking: Reported on 12/10/2018 10/14/18   Aldean Baker, NP  nicotine (NICODERM CQ - DOSED IN MG/24 HOURS) 21 mg/24hr patch Place 1 patch (21 mg total) onto the skin daily. (May buy from over the counter): Smoking cessation Patient not taking: Reported on 12/10/2018 10/14/18   Armandina Stammer I, NP  pantoprazole (PROTONIX) 20 MG tablet Take 1 tablet (20 mg total) by mouth daily. For acid reflux Patient not taking: Reported on 12/10/2018 10/14/18    Sanjuana Kava, NP    Family History History reviewed. No pertinent family history.  Social History Social History   Tobacco Use  . Smoking status: Current Every Day Smoker    Packs/day: 0.50    Years: 7.00    Pack years: 3.50    Types: Cigarettes  . Smokeless tobacco: Never Used  Substance Use Topics  . Alcohol use: Yes    Alcohol/week: 3.0 standard drinks    Types: 3 Standard drinks or equivalent per week    Comment: Daily use ''alcohol'' -- BAAC was 302  . Drug use: Yes    Types: Marijuana     Allergies   Ketorolac and Tramadol   Review of Systems Review of Systems  Constitutional: Positive for appetite change. Negative for chills and fever.  Eyes: Negative for photophobia, pain, discharge and redness.  Respiratory: Negative for cough and shortness of breath.   Cardiovascular: Negative for chest pain.  Gastrointestinal: Positive for abdominal pain (chronic), nausea and vomiting. Negative for diarrhea.  Genitourinary: Negative for dysuria and flank pain.  Musculoskeletal: Negative for arthralgias, back pain, joint swelling, neck pain and neck stiffness.  Skin: Positive for wound. Negative for rash.  Allergic/Immunologic: Negative for environmental allergies and food allergies.  Neurological: Positive for dizziness, seizures and headaches. Negative for tremors, facial asymmetry, speech difficulty and light-headedness.     Physical Exam Updated Vital Signs BP 137/75   Pulse 68   Temp 97.6 F (36.4 C) (Oral)   Resp 12   Ht 5\' 1"  (1.549 m)   Wt 58.5 kg   SpO2 95%   BMI 24.37 kg/m   Physical Exam Vitals signs and nursing note reviewed.  Constitutional:      General: She is not in acute distress.    Appearance: Normal appearance. She is not ill-appearing, toxic-appearing or diaphoretic.  HENT:     Head: Abrasion and contusion present. No raccoon eyes, Battle's sign, masses, right periorbital erythema, left periorbital erythema or laceration. Hair is normal.      Jaw: There is normal jaw occlusion. No trismus, tenderness, pain on movement or malocclusion.      Comments: No pain with eye movements. Full exreaoccular movements    Right Ear: No hemotympanum.     Left Ear: No hemotympanum.     Mouth/Throat:     Tongue: No lesions. Tongue does not protrude in midline.  Skin:    Comments: Other than her head, there are no obvious signs of injury or trauma. No deformities. Patient moving all 4 extremities without pain  Neurological:     Mental Status: She is alert.     Cranial Nerves: No cranial nerve deficit, dysarthria or facial asymmetry.     Sensory: Sensation is intact.     Motor: Motor function is intact.     Comments: No nystagmus  ED Treatments / Results  Labs (all labs ordered are listed, but only abnormal results are displayed) Labs Reviewed  CBC WITH DIFFERENTIAL/PLATELET - Abnormal; Notable for the following components:      Result Value   RBC 3.67 (*)    MCV 100.8 (*)    Platelets 136 (*)    All other components within normal limits  COMPREHENSIVE METABOLIC PANEL - Abnormal; Notable for the following components:   Sodium 133 (*)    Chloride 96 (*)    Glucose, Bld 133 (*)    BUN 6 (*)    AST 147 (*)    ALT 88 (*)    Total Bilirubin 1.5 (*)    All other components within normal limits  URINALYSIS, ROUTINE W REFLEX MICROSCOPIC - Abnormal; Notable for the following components:   Hgb urine dipstick SMALL (*)    Ketones, ur 20 (*)    Protein, ur 30 (*)    All other components within normal limits  COMPREHENSIVE METABOLIC PANEL - Abnormal; Notable for the following components:   BUN 5 (*)    Calcium 8.5 (*)    Total Protein 6.3 (*)    AST 103 (*)    ALT 68 (*)    Total Bilirubin 1.4 (*)    All other components within normal limits  CBC - Abnormal; Notable for the following components:   RBC 3.52 (*)    Hemoglobin 11.7 (*)    HCT 35.4 (*)    MCV 100.6 (*)    Platelets 125 (*)    All other components within  normal limits  CBG MONITORING, ED - Abnormal; Notable for the following components:   Glucose-Capillary 141 (*)    All other components within normal limits  MRSA PCR SCREENING  RAPID URINE DRUG SCREEN, HOSP PERFORMED  ETHANOL  MAGNESIUM  HIV ANTIBODY (ROUTINE TESTING W REFLEX)    EKG None  Radiology Dg Chest 2 View  Result Date: 12/11/2018 CLINICAL DATA:  Fall.  Seizure. EXAM: CHEST - 2 VIEW COMPARISON:  10/11/2018 FINDINGS: The lungs are clear without focal pneumonia, edema, pneumothorax or pleural effusion. Interstitial markings are diffusely coarsened with chronic features. The cardiopericardial silhouette is within normal limits for size. The visualized bony structures of the thorax are intact. IMPRESSION: No active cardiopulmonary disease. Electronically Signed   By: Kennith Center M.D.   On: 12/11/2018 16:38   Ct Head Wo Contrast  Result Date: 12/12/2018 CLINICAL DATA:  Subarachnoid hemorrhage. EXAM: CT HEAD WITHOUT CONTRAST TECHNIQUE: Contiguous axial images were obtained from the base of the skull through the vertex without intravenous contrast. COMPARISON:  CT scan of December 11, 2018. FINDINGS: Brain: Mild diffuse cortical atrophy is noted. Mild chronic ischemic white matter disease is noted. Stable small left frontal contusion is noted. Stable small amount of associated subarachnoid hemorrhage. No mass effect or midline shift is noted. Ventricular size is within normal limits. No acute infarction or mass lesion is noted. Vascular: No hyperdense vessel or unexpected calcification. Skull: Normal. Negative for fracture or focal lesion. Sinuses/Orbits: No acute finding. Other: Stable contusion involving soft tissues overlying left frontal scalp. IMPRESSION: Stable small left frontal intraparenchymal contusion is noted with associated small amount of subarachnoid hemorrhage. Mild diffuse cortical atrophy and mild chronic ischemic white matter disease is noted as well. Electronically  Signed   By: Lupita Raider, M.D.   On: 12/12/2018 08:27   Ct Head Wo Contrast  Result Date: 12/11/2018 CLINICAL DATA:  Status post seizure today with  a fall and blow to the face. Facial abrasions. Initial encounter. EXAM: CT HEAD WITHOUT CONTRAST CT MAXILLOFACIAL WITHOUT CONTRAST CT CERVICAL SPINE WITHOUT CONTRAST TECHNIQUE: Multidetector CT imaging of the head, cervical spine, and maxillofacial structures were performed using the standard protocol without intravenous contrast. Multiplanar CT image reconstructions of the cervical spine and maxillofacial structures were also generated. COMPARISON:  Head and cervical spine CT scans 11/14/2018. FINDINGS: CT HEAD FINDINGS Brain: A small parenchymal contusion and small amount of subarachnoid hemorrhage are seen in the paramedian left frontal lobe. No other acute abnormality including subdural hemorrhage, infarct, mass lesion, intraventricular hemorrhage, midline shift or hydrocephalus. Cortical atrophy and chronic microvascular ischemic change noted. Vascular: No hyperdense vessel or unexpected calcification. Skull: Intact.  No focal lesion. Other: None. CT MAXILLOFACIAL FINDINGS Osseous: No fracture or mandibular dislocation. No destructive process. Orbits: The globes are intact and lenses are located. Orbital fat is clear. There is some preseptal hematoma about the periphery of the left eye. Sinuses: Clear. Soft tissues: Soft tissue contusion is seen over the scratch the soft tissue contusion on the left side of the head and forehead is identified. CT CERVICAL SPINE FINDINGS Alignment: Maintained with straightening of lordosis noted. Skull base and vertebrae: No acute fracture. No primary bone lesion or focal pathologic process. Soft tissues and spinal canal: No prevertebral fluid or swelling. No visible canal hematoma. Disc levels: The patient is status post C4-7 discectomy and fusion. As on the prior exam, no osseous fusion across the C4-5 disc interspace is  identified. Osseous fusion is seen across C5-6 and C6-7. Hardware is intact. Upper chest: Lung apices are clear. Other: None. IMPRESSION: Small parenchymal contusion in the left frontal lobe with associated small volume of subarachnoid hemorrhage. Negative for midline shift or hydrocephalus. No other acute intracranial normality. Soft tissue contusions about the left side of the head and face without facial bone fracture. No acute abnormality cervical spine. Atrophy and chronic microvascular ischemic change. Status post C4-7 ACDF. Critical Value/emergent results were called by telephone at the time of interpretation on 12/11/2018 at 4:37 pm to [facility] who verbally acknowledged these results. Electronically Signed   By: Drusilla Kanner M.D.   On: 12/11/2018 16:43   Ct Cervical Spine Wo Contrast  Result Date: 12/11/2018 CLINICAL DATA:  Status post seizure today with a fall and blow to the face. Facial abrasions. Initial encounter. EXAM: CT HEAD WITHOUT CONTRAST CT MAXILLOFACIAL WITHOUT CONTRAST CT CERVICAL SPINE WITHOUT CONTRAST TECHNIQUE: Multidetector CT imaging of the head, cervical spine, and maxillofacial structures were performed using the standard protocol without intravenous contrast. Multiplanar CT image reconstructions of the cervical spine and maxillofacial structures were also generated. COMPARISON:  Head and cervical spine CT scans 11/14/2018. FINDINGS: CT HEAD FINDINGS Brain: A small parenchymal contusion and small amount of subarachnoid hemorrhage are seen in the paramedian left frontal lobe. No other acute abnormality including subdural hemorrhage, infarct, mass lesion, intraventricular hemorrhage, midline shift or hydrocephalus. Cortical atrophy and chronic microvascular ischemic change noted. Vascular: No hyperdense vessel or unexpected calcification. Skull: Intact.  No focal lesion. Other: None. CT MAXILLOFACIAL FINDINGS Osseous: No fracture or mandibular dislocation. No destructive  process. Orbits: The globes are intact and lenses are located. Orbital fat is clear. There is some preseptal hematoma about the periphery of the left eye. Sinuses: Clear. Soft tissues: Soft tissue contusion is seen over the scratch the soft tissue contusion on the left side of the head and forehead is identified. CT CERVICAL SPINE FINDINGS Alignment: Maintained  with straightening of lordosis noted. Skull base and vertebrae: No acute fracture. No primary bone lesion or focal pathologic process. Soft tissues and spinal canal: No prevertebral fluid or swelling. No visible canal hematoma. Disc levels: The patient is status post C4-7 discectomy and fusion. As on the prior exam, no osseous fusion across the C4-5 disc interspace is identified. Osseous fusion is seen across C5-6 and C6-7. Hardware is intact. Upper chest: Lung apices are clear. Other: None. IMPRESSION: Small parenchymal contusion in the left frontal lobe with associated small volume of subarachnoid hemorrhage. Negative for midline shift or hydrocephalus. No other acute intracranial normality. Soft tissue contusions about the left side of the head and face without facial bone fracture. No acute abnormality cervical spine. Atrophy and chronic microvascular ischemic change. Status post C4-7 ACDF. Critical Value/emergent results were called by telephone at the time of interpretation on 12/11/2018 at 4:37 pm to [facility] who verbally acknowledged these results. Electronically Signed   By: Drusilla Kannerhomas  Dalessio M.D.   On: 12/11/2018 16:43   Ct Maxillofacial Wo Contrast  Result Date: 12/11/2018 CLINICAL DATA:  Status post seizure today with a fall and blow to the face. Facial abrasions. Initial encounter. EXAM: CT HEAD WITHOUT CONTRAST CT MAXILLOFACIAL WITHOUT CONTRAST CT CERVICAL SPINE WITHOUT CONTRAST TECHNIQUE: Multidetector CT imaging of the head, cervical spine, and maxillofacial structures were performed using the standard protocol without intravenous  contrast. Multiplanar CT image reconstructions of the cervical spine and maxillofacial structures were also generated. COMPARISON:  Head and cervical spine CT scans 11/14/2018. FINDINGS: CT HEAD FINDINGS Brain: A small parenchymal contusion and small amount of subarachnoid hemorrhage are seen in the paramedian left frontal lobe. No other acute abnormality including subdural hemorrhage, infarct, mass lesion, intraventricular hemorrhage, midline shift or hydrocephalus. Cortical atrophy and chronic microvascular ischemic change noted. Vascular: No hyperdense vessel or unexpected calcification. Skull: Intact.  No focal lesion. Other: None. CT MAXILLOFACIAL FINDINGS Osseous: No fracture or mandibular dislocation. No destructive process. Orbits: The globes are intact and lenses are located. Orbital fat is clear. There is some preseptal hematoma about the periphery of the left eye. Sinuses: Clear. Soft tissues: Soft tissue contusion is seen over the scratch the soft tissue contusion on the left side of the head and forehead is identified. CT CERVICAL SPINE FINDINGS Alignment: Maintained with straightening of lordosis noted. Skull base and vertebrae: No acute fracture. No primary bone lesion or focal pathologic process. Soft tissues and spinal canal: No prevertebral fluid or swelling. No visible canal hematoma. Disc levels: The patient is status post C4-7 discectomy and fusion. As on the prior exam, no osseous fusion across the C4-5 disc interspace is identified. Osseous fusion is seen across C5-6 and C6-7. Hardware is intact. Upper chest: Lung apices are clear. Other: None. IMPRESSION: Small parenchymal contusion in the left frontal lobe with associated small volume of subarachnoid hemorrhage. Negative for midline shift or hydrocephalus. No other acute intracranial normality. Soft tissue contusions about the left side of the head and face without facial bone fracture. No acute abnormality cervical spine. Atrophy and  chronic microvascular ischemic change. Status post C4-7 ACDF. Critical Value/emergent results were called by telephone at the time of interpretation on 12/11/2018 at 4:37 pm to [facility] Debanhi Blaker[facility] P.A., who verbally acknowledged these results. Electronically Signed   By: Drusilla Kannerhomas  Dalessio M.D.   On: 12/11/2018 16:43    Procedures Procedures (including critical care time)  Medications Ordered in ED Medications  levETIRAcetam (KEPPRA) tablet 500 mg (500 mg Oral Given  12/12/18 0935)  ondansetron (ZOFRAN-ODT) disintegrating tablet 4 mg (4 mg Oral Given 12/12/18 0606)  LORazepam (ATIVAN) tablet 1 mg (has no administration in time range)    Or  LORazepam (ATIVAN) injection 1 mg (has no administration in time range)  thiamine (VITAMIN B-1) tablet 100 mg (100 mg Oral Given 12/12/18 0935)    Or  thiamine (B-1) injection 100 mg ( Intravenous See Alternative 12/12/18 0935)  folic acid (FOLVITE) tablet 1 mg (1 mg Oral Given 12/12/18 0935)  multivitamin with minerals tablet 1 tablet (1 tablet Oral Given 12/12/18 0935)  pantoprazole (PROTONIX) EC tablet 40 mg (40 mg Oral Given 12/12/18 0935)  MEDLINE mouth rinse (15 mLs Mouth Rinse Not Given 12/12/18 4536)  oxyCODONE (Oxy IR/ROXICODONE) immediate release tablet 5 mg (5 mg Oral Given 12/12/18 1051)  sodium chloride 0.9 % 1,000 mL with thiamine 100 mg, folic acid 1 mg, multivitamins adult 10 mL infusion (150 mL/hr Intravenous Restarted 12/11/18 1922)  ibuprofen (ADVIL,MOTRIN) tablet 800 mg (800 mg Oral Given 12/11/18 1637)  oxyCODONE (Oxy IR/ROXICODONE) immediate release tablet 5 mg (5 mg Oral Given 12/11/18 2136)     Initial Impression / Assessment and Plan / ED Course  I have reviewed the triage vital signs and the nursing notes.  Pertinent labs & imaging results that were available during my care of the patient were reviewed by me and considered in my medical decision making (see chart for details).  Clinical Course as of Dec 12 1525  Sun Dec 11, 2018  1739 I spoke  with neurosurgery on call who advised to admit patient here and repeat CT head in the morning. Keppra 500mg  bid.   [KM]    Clinical Course User Index [KM] Arlyn Dunning, PA-C      CRITICAL CARE Performed by: Arlyn Dunning   Total critical care time: 45 minutes  Critical care time was exclusive of separately billable procedures and treating other patients.  Critical care was necessary to treat or prevent imminent or life-threatening deterioration.  Critical care was time spent personally by me on the following activities: development of treatment plan with patient and/or surrogate as well as nursing, discussions with consultants, evaluation of patient's response to treatment, examination of patient, obtaining history from patient or surrogate, ordering and performing treatments and interventions, ordering and review of laboratory studies, ordering and review of radiographic studies, pulse oximetry and re-evaluation of patient's condition.   Final Clinical Impressions(s) / ED Diagnoses   Final diagnoses:  Subarachnoid hemorrhage (HCC)  Alcohol withdrawal seizure with complication [facility])    ED Discharge Orders    None       Jeral Pinch 12/12/18 1527    Tegeler, Canary Brim, MD 12/13/18 [phone]

## 2018-12-11 NOTE — H&P (Signed)
History and Physical   [name] ZOX:[id number] DOB: [birth year] DOA: 12/11/2018  Referring MD/NP/PA: Dr Rush Landmark  PCP: Patient, No Pcp Per   Outpatient Specialists: None   Patient coming from: Home  Chief Complaint: Seizure  HPI: [name] is a 63 y.o. female with medical history significant of Alcohol abuse with frequent alcohol withdrawal seizures who has been trying to self detoxify.  She was in the ER only yesterday complaining of depression and wanting to quit alcohol.  She has been drinking 12 pack of beer per day.  She had 4 beers yesterday prior to arrival.  She denied any suicidal ideation or homicidal ideation.  She is having abdominal pain a 7 out of 10 when she came in last night.  Patient seen and evaluated.  She was supposed to be on multiple medications including Cymbalta and Protonix which she has not been taking.  [place] have seizure.  She fell and hit her head and brought to the ER for evaluation.  She is now found to have Small parenchymal contusion in the left frontal lobe.  This is associated with subarachnoid hemorrhage.  No midline shift.  Also multiple injuries.  Patient is being admitted to the hospital with alcohol withdrawal as well as observation for the subarachnoid hemorrhage.  ED Course: Temperature is 98 with blood pressure 147/84 her pulse 91 respiratory rate of 20 oxygen sats 95% room air.  White count 5.8 hemoglobin 12.3 but platelets 136.  Sodium is 133 potassium is 3.6 chloride 96 BUN 6 creatinine 0.6 and glucose 133.  Alcohol level is less than 10.  CT head neck and maxillofacial showed the left frontal lobe contusion with subarachnoid hemorrhage.  Also soft tissue contusions about the left side of the head and face but no bony fractures.  Neurosurgery was consulted by the ER and suggested Keppra and admission for observation.  Repeat head CT in the morning.  Patient is therefore being admitted to stepdown  unit.  Review of Systems: As per HPI otherwise 10 point review of systems negative.    Past Medical History:  Diagnosis Date  . Alcohol dependence (HCC)   . Anxiety   . Chronic pain   . Depression   . Herniated cervical disc     Past Surgical History:  Procedure Laterality Date  . ABDOMINAL HYSTERECTOMY    . ANKLE SURGERY Right   . CARPAL TUNNEL RELEASE Bilateral   . CERVICAL FUSION    . CHOLECYSTECTOMY    . KNEE SURGERY       reports that she has been smoking cigarettes. She has a 3.50 pack-year smoking history. She has never used smokeless tobacco. She reports current alcohol use of about 3.0 standard drinks of alcohol per week. She reports current drug use. Drug: Marijuana.  Allergies  Allergen Reactions  . Ketorolac Hives  . Tramadol Hives    History reviewed. No pertinent family history.   Prior to Admission medications   Medication Sig Start Date End Date Taking? Authorizing Provider  ondansetron (ZOFRAN ODT) 4 MG disintegrating tablet Take 1 tablet (4 mg total) by mouth every 8 (eight) hours as needed for nausea or vomiting. 12/10/18  Yes Street, Marshall, PA-C  chlordiazePOXIDE (LIBRIUM) 25 MG capsule  PO TID x 1D, then 25-50mg  PO BID X 1D, then 25-50mg  PO QD X 1D Patient not taking: Reported on 12/10/2018 11/26/18   Ward, Chase Picket, PA-C  DULoxetine (CYMBALTA) 30 MG capsule Take 1 capsule (  30 mg total) by mouth daily. For depression Patient not taking: Reported on 12/10/2018 11/26/18   Ward, Chase Picket, PA-C  gabapentin (NEURONTIN) 400 MG capsule Take 1 capsule (400 mg total) by mouth 3 (three) times daily. For agitation Patient not taking: Reported on 12/10/2018 11/26/18   Ward, Chase Picket, PA-C  hydrOXYzine (ATARAX/VISTARIL) 25 MG tablet Take 1 tablet (25 mg total) by mouth every 6 (six) hours as needed. For anxiety Patient not taking: Reported on 12/10/2018 11/26/18   Ward, Chase Picket, PA-C  metoprolol succinate (TOPROL-XL) 25 MG 24 hr tablet Take 1  tablet (25 mg total) by mouth daily. For blood pressure Patient not taking: Reported on 12/10/2018 10/14/18   Aldean Baker, NP  nicotine (NICODERM CQ - DOSED IN MG/24 HOURS) 21 mg/24hr patch Place 1 patch (21 mg total) onto the skin daily. (May buy from over the counter): Smoking cessation Patient not taking: Reported on 12/10/2018 10/14/18   Armandina Stammer I, NP  pantoprazole (PROTONIX) 20 MG tablet Take 1 tablet (20 mg total) by mouth daily. For acid reflux Patient not taking: Reported on 12/10/2018 10/14/18   Armandina Stammer I, NP    Physical Exam: Vitals:   12/11/18 1500 12/11/18 1530 12/11/18 1550 12/11/18 1748  BP: 140/81 135/81 135/81 136/80  Pulse: 86 86 85 91  Resp: Temp:      TempSrc:      SpO2: 98% 96% 100% 97%      Constitutional: NAD, calm, comfortable, tremulous Vitals:   12/11/18 1500 12/11/18 1530 12/11/18 1550 12/11/18 1748  BP: 140/81 135/81 135/81 136/80  Pulse: 86 86 85 91  Resp: Temp:      TempSrc:      SpO2: 98% 96% 100% 97%   Eyes: PERRL, lids and conjunctivae normal, left frontal area laceration with swelling in the supraorbital area ENMT: Mucous membranes are moist. Posterior pharynx clear of any exudate or lesions.Normal dentition.  Neck: normal, supple, no masses, no thyromegaly Respiratory: clear to auscultation bilaterally, no wheezing, no crackles. Normal respiratory effort. No accessory muscle use.  Cardiovascular: Regular rate and rhythm, no murmurs / rubs / gallops. No extremity edema. 2+ pedal pulses. No carotid bruits.  Abdomen: no tenderness, no masses palpated. No hepatosplenomegaly. Bowel sounds positive.  Musculoskeletal: no clubbing / cyanosis. No joint deformity upper and lower extremities. Good ROM, no contractures. Normal muscle tone.  Skin: no rashes, lesions, ulcers. No induration Neurologic: CN 2-12 grossly intact. Sensation intact, DTR normal. Strength 5/5 in all 4.  Psychiatric: Depressed with slight tremor  and mild agitation    Labs on Admission: I have personally reviewed following labs and imaging studies  CBC: Recent Labs  Lab 12/10/18 1923 12/11/18 1523  WBC 3.9* 5.8  NEUTROABS 1.6* 4.7  HGB 14.0 12.3  HCT 43.6 37.0  MCV 103.8* 100.8*  PLT 144* 136*   Basic Metabolic Panel: Recent Labs  Lab 12/10/18 1923 12/11/18 1523  NA 137 133*  K 3.9 3.6  CL 101 96*  CO2 19* 25  GLUCOSE 84 133*  BUN 8 6*  CREATININE 0.59 0.60  CALCIUM 8.5* 9.0  MG  --  1.9   GFR: CrCl cannot be calculated (Unknown ideal weight.). Liver Function Tests: Recent Labs  Lab 12/10/18 1923 12/11/18 1523  AST 223* 147*  ALT 107* 88*  ALKPHOS 79 69  BILITOT 0.7 1.5*  PROT 7.3 7.1  ALBUMIN 4.7 4.4   Recent Labs  Lab 12/10/18  1923  LIPASE 41   No results for input(s): AMMONIA in the last 168 hours. Coagulation Profile: No results for input(s): INR, PROTIME in the last 168 hours. Cardiac Enzymes: No results for input(s): CKTOTAL, CKMB, CKMBINDEX, TROPONINI in the last 168 hours. BNP (last 3 results) No results for input(s): PROBNP in the last 8760 hours. HbA1C: No results for input(s): HGBA1C in the last 72 hours. CBG: Recent Labs  Lab 12/11/18 1550  GLUCAP 141*   Lipid Profile: No results for input(s): CHOL, HDL, LDLCALC, TRIG, CHOLHDL, LDLDIRECT in the last 72 hours. Thyroid Function Tests: No results for input(s): TSH, T4TOTAL, FREET4, T3FREE, THYROIDAB in the last 72 hours. Anemia Panel: No results for input(s): VITAMINB12, FOLATE, FERRITIN, TIBC, IRON, RETICCTPCT in the last 72 hours. Urine analysis:    Component Value Date/Time   COLORURINE YELLOW 12/10/2018 1923   APPEARANCEUR CLEAR 12/10/2018 1923   LABSPEC 1.013 12/10/2018 1923   PHURINE 5.0 12/10/2018 1923   GLUCOSEU NEGATIVE 12/10/2018 1923   HGBUR SMALL (A) 12/10/2018 1923   BILIRUBINUR NEGATIVE 12/10/2018 1923   KETONESUR 20 (A) 12/10/2018 1923   PROTEINUR NEGATIVE 12/10/2018 1923   UROBILINOGEN 0.2 05/26/2018  1505   NITRITE NEGATIVE 12/10/2018 1923   LEUKOCYTESUR NEGATIVE 12/10/2018 1923   Sepsis Labs: @LABRCNTIP (procalcitonin:4,lacticidven:4) )No results found for this or any previous visit (from the past 240 hour(s)).   Radiological Exams on Admission: Dg Chest 2 View  Result Date: 12/11/2018 CLINICAL DATA:  Fall.  Seizure. EXAM: CHEST - 2 VIEW COMPARISON:  10/11/2018 FINDINGS: The lungs are clear without focal pneumonia, edema, pneumothorax or pleural effusion. Interstitial markings are diffusely coarsened with chronic features. The cardiopericardial silhouette is within normal limits for size. The visualized bony structures of the thorax are intact. IMPRESSION: No active cardiopulmonary disease. Electronically Signed   By: Kennith Center M.D.   On: 12/11/2018 16:38   Ct Head Wo Contrast  Result Date: 12/11/2018 CLINICAL DATA:  Status post seizure today with a fall and blow to the face. Facial abrasions. Initial encounter. EXAM: CT HEAD WITHOUT CONTRAST CT MAXILLOFACIAL WITHOUT CONTRAST CT CERVICAL SPINE WITHOUT CONTRAST TECHNIQUE: Multidetector CT imaging of the head, cervical spine, and maxillofacial structures were performed using the standard protocol without intravenous contrast. Multiplanar CT image reconstructions of the cervical spine and maxillofacial structures were also generated. COMPARISON:  Head and cervical spine CT scans 11/14/2018. FINDINGS: CT HEAD FINDINGS Brain: A small parenchymal contusion and small amount of subarachnoid hemorrhage are seen in the paramedian left frontal lobe. No other acute abnormality including subdural hemorrhage, infarct, mass lesion, intraventricular hemorrhage, midline shift or hydrocephalus. Cortical atrophy and chronic microvascular ischemic change noted. Vascular: No hyperdense vessel or unexpected calcification. Skull: Intact.  No focal lesion. Other: None. CT MAXILLOFACIAL FINDINGS Osseous: No fracture or mandibular dislocation. No destructive process.  Orbits: The globes are intact and lenses are located. Orbital fat is clear. There is some preseptal hematoma about the periphery of the left eye. Sinuses: Clear. Soft tissues: Soft tissue contusion is seen over the scratch the soft tissue contusion on the left side of the head and forehead is identified. CT CERVICAL SPINE FINDINGS Alignment: Maintained with straightening of lordosis noted. Skull base and vertebrae: No acute fracture. No primary bone lesion or focal pathologic process. Soft tissues and spinal canal: No prevertebral fluid or swelling. No visible canal hematoma. Disc levels: The patient is status post C4-7 discectomy and fusion. As on the prior exam, no osseous fusion across the C4-5 disc interspace is identified.  Osseous fusion is seen across C5-6 and C6-7. Hardware is intact. Upper chest: Lung apices are clear. Other: None. IMPRESSION: Small parenchymal contusion in the left frontal lobe with associated small volume of subarachnoid hemorrhage. Negative for midline shift or hydrocephalus. No other acute intracranial normality. Soft tissue contusions about the left side of the head and face without facial bone fracture. No acute abnormality cervical spine. Atrophy and chronic microvascular ischemic change. Status post C4-7 ACDF. Critical Value/emergent results were called by telephone at the time of interpretation on 12/11/2018 at 4:37 pm to [facility] who verbally acknowledged these results. Electronically Signed   By: Drusilla Kanner M.D.   On: 12/11/2018 16:43   Ct Cervical Spine Wo Contrast  Result Date: 12/11/2018 CLINICAL DATA:  Status post seizure today with a fall and blow to the face. Facial abrasions. Initial encounter. EXAM: CT HEAD WITHOUT CONTRAST CT MAXILLOFACIAL WITHOUT CONTRAST CT CERVICAL SPINE WITHOUT CONTRAST TECHNIQUE: Multidetector CT imaging of the head, cervical spine, and maxillofacial structures were performed using the standard protocol without intravenous  contrast. Multiplanar CT image reconstructions of the cervical spine and maxillofacial structures were also generated. COMPARISON:  Head and cervical spine CT scans 11/14/2018. FINDINGS: CT HEAD FINDINGS Brain: A small parenchymal contusion and small amount of subarachnoid hemorrhage are seen in the paramedian left frontal lobe. No other acute abnormality including subdural hemorrhage, infarct, mass lesion, intraventricular hemorrhage, midline shift or hydrocephalus. Cortical atrophy and chronic microvascular ischemic change noted. Vascular: No hyperdense vessel or unexpected calcification. Skull: Intact.  No focal lesion. Other: None. CT MAXILLOFACIAL FINDINGS Osseous: No fracture or mandibular dislocation. No destructive process. Orbits: The globes are intact and lenses are located. Orbital fat is clear. There is some preseptal hematoma about the periphery of the left eye. Sinuses: Clear. Soft tissues: Soft tissue contusion is seen over the scratch the soft tissue contusion on the left side of the head and forehead is identified. CT CERVICAL SPINE FINDINGS Alignment: Maintained with straightening of lordosis noted. Skull base and vertebrae: No acute fracture. No primary bone lesion or focal pathologic process. Soft tissues and spinal canal: No prevertebral fluid or swelling. No visible canal hematoma. Disc levels: The patient is status post C4-7 discectomy and fusion. As on the prior exam, no osseous fusion across the C4-5 disc interspace is identified. Osseous fusion is seen across C5-6 and C6-7. Hardware is intact. Upper chest: Lung apices are clear. Other: None. IMPRESSION: Small parenchymal contusion in the left frontal lobe with associated small volume of subarachnoid hemorrhage. Negative for midline shift or hydrocephalus. No other acute intracranial normality. Soft tissue contusions about the left side of the head and face without facial bone fracture. No acute abnormality cervical spine. Atrophy and  chronic microvascular ischemic change. Status post C4-7 ACDF. Critical Value/emergent results were called by telephone at the time of interpretation on 12/11/2018 at 4:37 pm to [facility] who verbally acknowledged these results. Electronically Signed   By: Drusilla Kanner M.D.   On: 12/11/2018 16:43   Ct Maxillofacial Wo Contrast  Result Date: 12/11/2018 CLINICAL DATA:  Status post seizure today with a fall and blow to the face. Facial abrasions. Initial encounter. EXAM: CT HEAD WITHOUT CONTRAST CT MAXILLOFACIAL WITHOUT CONTRAST CT CERVICAL SPINE WITHOUT CONTRAST TECHNIQUE: Multidetector CT imaging of the head, cervical spine, and maxillofacial structures were performed using the standard protocol without intravenous contrast. Multiplanar CT image reconstructions of the cervical spine and maxillofacial structures were also generated. COMPARISON:  Head and cervical spine  CT scans 11/14/2018. FINDINGS: CT HEAD FINDINGS Brain: A small parenchymal contusion and small amount of subarachnoid hemorrhage are seen in the paramedian left frontal lobe. No other acute abnormality including subdural hemorrhage, infarct, mass lesion, intraventricular hemorrhage, midline shift or hydrocephalus. Cortical atrophy and chronic microvascular ischemic change noted. Vascular: No hyperdense vessel or unexpected calcification. Skull: Intact.  No focal lesion. Other: None. CT MAXILLOFACIAL FINDINGS Osseous: No fracture or mandibular dislocation. No destructive process. Orbits: The globes are intact and lenses are located. Orbital fat is clear. There is some preseptal hematoma about the periphery of the left eye. Sinuses: Clear. Soft tissues: Soft tissue contusion is seen over the scratch the soft tissue contusion on the left side of the head and forehead is identified. CT CERVICAL SPINE FINDINGS Alignment: Maintained with straightening of lordosis noted. Skull base and vertebrae: No acute fracture. No primary bone lesion or  focal pathologic process. Soft tissues and spinal canal: No prevertebral fluid or swelling. No visible canal hematoma. Disc levels: The patient is status post C4-7 discectomy and fusion. As on the prior exam, no osseous fusion across the C4-5 disc interspace is identified. Osseous fusion is seen across C5-6 and C6-7. Hardware is intact. Upper chest: Lung apices are clear. Other: None. IMPRESSION: Small parenchymal contusion in the left frontal lobe with associated small volume of subarachnoid hemorrhage. Negative for midline shift or hydrocephalus. No other acute intracranial normality. Soft tissue contusions about the left side of the head and face without facial bone fracture. No acute abnormality cervical spine. Atrophy and chronic microvascular ischemic change. Status post C4-7 ACDF. Critical Value/emergent results were called by telephone at the time of interpretation on 12/11/2018 at 4:37 pm to [facility] who verbally acknowledged these results. Electronically Signed   By: Drusilla Kanner M.D.   On: 12/11/2018 16:43   Assessment/Plan Principal Problem:   Subarachnoid hemorrhage (HCC) Active Problems:   Alcohol dependence with uncomplicated withdrawal (HCC)   Abdominal pain   Seizure due to alcohol withdrawal (HCC)     #1 subarachnoid hemorrhage: Secondary to trauma.  Will follow recommendations by neurosurgery.  Initiate Keppra 500 twice daily and recheck head CT in the morning  #2 alcohol withdrawal seizure: Patient's alcohol level is less than 10.  We will initiate CIWA protocol.  She is tremulous and evidence of early DTs.  Thiamine and folic acid.  Ativan as needed in addition to the CIWA.  #3 depression: Patient was supposed to be on Cymbalta.  We will discuss if patient is willing to continue taking it instead of ordering it in the hospital but she refused to take it in the outpatient setting.  #4 hyponatremia and dehydration: Secondary to alcoholism and withdrawal.  Hydrate  patient.  #5 tobacco abuse: Initiate nicotine patch.  Counseling provided.   DVT prophylaxis: SCD Code Status: Full code Family Communication: No family at bedside Disposition Plan: To be determined Consults called: Neurosurgery,. Admission status: Inpatient to stepdown bed  Severity of Illness: The appropriate patient status for this patient is INPATIENT. Inpatient status is judged to be reasonable and necessary in order to provide the required intensity of service to ensure the patient's safety. The patient's presenting symptoms, physical exam findings, and initial radiographic and laboratory data in the context of their chronic comorbidities is felt to place them at high risk for further clinical deterioration. Furthermore, it is not anticipated that the patient will be medically stable for discharge from the hospital within 2 midnights of admission. The following factors  support the patient status of inpatient.   " The patient's presenting symptoms include seizure. " The worrisome physical exam findings include multiple bruises in the face. " The initial radiographic and laboratory data are worrisome because of alcohol level less than 10 with CT evidence of subarachnoid bleed. " The chronic co-morbidities include chronic alcoholism.   * I certify that at the point of admission it is my clinical judgment that the patient will require inpatient hospital care spanning beyond 2 midnights from the point of admission due to high intensity of service, high risk for further deterioration and high frequency of surveillance required.Lonia Blood MD [facility] Pager [phone]  If 7PM-7AM, please contact night-coverage [email or web address] Password [facility]  12/11/2018, 6:31 PM

## 2018-12-11 NOTE — ED Notes (Signed)
Bed: UT65 Expected date:  Expected time:  Means of arrival:  Comments: 63 yo seizures, facial injury

## 2018-12-11 NOTE — ED Notes (Signed)
Pt face cleaned, bandage placed on small cut below pt's left eyebrow

## 2018-12-12 ENCOUNTER — Inpatient Hospital Stay (HOSPITAL_COMMUNITY)
Admit: 2018-12-12 | Discharge: 2018-12-12 | Disposition: A | Discharge status: Home / Self Care | Payer: Medicaid Other | Attending: Family Medicine | Admitting: Family Medicine

## 2018-12-12 ENCOUNTER — Encounter (HOSPITAL_COMMUNITY): Payer: Self-pay | Admitting: Radiology

## 2018-12-12 ENCOUNTER — Inpatient Hospital Stay (HOSPITAL_COMMUNITY): Payer: Medicaid Other

## 2018-12-12 DIAGNOSIS — F10239 Alcohol dependence with withdrawal, unspecified: Secondary | ICD-10-CM

## 2018-12-12 DIAGNOSIS — K7010 Alcoholic hepatitis without ascites: Secondary | ICD-10-CM

## 2018-12-12 DIAGNOSIS — D696 Thrombocytopenia, unspecified: Secondary | ICD-10-CM

## 2018-12-12 DIAGNOSIS — R569 Unspecified convulsions: Secondary | ICD-10-CM

## 2018-12-12 DIAGNOSIS — F10230 Alcohol dependence with withdrawal, uncomplicated: Secondary | ICD-10-CM

## 2018-12-12 LAB — COMPREHENSIVE METABOLIC PANEL
ALT: 68 U/L — ABNORMAL HIGH (ref 0–44)
AST: 103 U/L — AB (ref 15–41)
Albumin: 3.8 g/dL (ref 3.5–5.0)
Alkaline Phosphatase: 62 U/L (ref 38–126)
Anion gap: 10 (ref 5–15)
BUN: 5 mg/dL — ABNORMAL LOW (ref 8–23)
CO2: 26 mmol/L (ref 22–32)
Calcium: 8.5 mg/dL — ABNORMAL LOW (ref 8.9–10.3)
Chloride: 100 mmol/L (ref 98–111)
Creatinine, Ser: 0.53 mg/dL (ref 0.44–1.00)
GFR calc Af Amer: >60 mL/min (ref >60)
GFR calc non Af Amer: >60 mL/min (ref >60)
GLUCOSE: 77 mg/dL (ref 70–99)
Potassium: 3.5 mmol/L (ref 3.5–5.1)
Sodium: 136 mmol/L (ref 135–145)
TOTAL PROTEIN: 6.3 g/dL — AB (ref 6.5–8.1)
Total Bilirubin: 1.4 mg/dL — ABNORMAL HIGH (ref 0.3–1.2)

## 2018-12-12 LAB — CBC
HCT: 35.4 % — ABNORMAL LOW (ref 36.0–46.0)
Hemoglobin: 11.7 g/dL — ABNORMAL LOW (ref 12.0–15.0)
MCH: 33.2 pg (ref 26.0–34.0)
MCHC: 33.1 g/dL (ref 30.0–36.0)
MCV: 100.6 fL — ABNORMAL HIGH (ref 80.0–100.0)
Platelets: 125 10*3/uL — ABNORMAL LOW (ref 150–400)
RBC: 3.52 MIL/uL — ABNORMAL LOW (ref 3.87–5.11)
RDW: 13.4 % (ref 11.5–15.5)
WBC: 4 10*3/uL (ref 4.0–10.5)
nRBC: 0 % (ref 0.0–0.2)

## 2018-12-12 MED ORDER — OXYCODONE HCL 5 MG PO TABS
5.0000 mg | ORAL_TABLET | Freq: Four times a day (QID) | ORAL | Status: DC | PRN
  Administered 2018-12-12 – 2018-12-15 (×11): 5 mg via ORAL
  Filled 2018-12-12 (×11): qty 1

## 2018-12-12 NOTE — Evaluation (Signed)
Physical Therapy Evaluation Patient Details Name: [name] MRN: [id number] DOB: 08/11/1956 Today's Date: 12/12/2018   History of Present Illness   [name] is a 63 y.o. female with medical history significant of Alcohol abuse with frequent alcohol withdrawal seizures who has been trying to self detoxify.  Admitted with fall at bus stop due to seizure.  Clinical Impression  Patient presents with decreased mobility due to imbalance and some c/o dizziness.  Needs further work up to exclude vestibular cause, but limited due to EEG.  Feel she will benefit from skilled PT in the acute setting to maximize mobility/safety and balance prior to d/c as limited support available after d/c.     Follow Up Recommendations No PT follow up    Equipment Recommendations  Other (comment)(tba)    Recommendations for Other Services       Precautions / Restrictions Precautions Precautions: Fall Precaution Comments: seizure      Mobility  Bed Mobility Overal bed mobility: Needs Assistance Bed Mobility: Supine to Sit     Supine to sit: Supervision;HOB elevated     General bed mobility comments: cues to rise slowly, c/o dizziness when sitting up initially  Transfers Overall transfer level: Needs assistance Equipment used: None Transfers: Sit to/from Stand Sit to Stand: Min guard         General transfer comment: for balance; mild LOB initially when standing but improved after ambulation in hallway  Ambulation/Gait Ambulation/Gait assistance: Min guard;Supervision Gait Distance (Feet): 150 Feet Assistive device: None Gait Pattern/deviations: Step-through pattern;Decreased stride length;Drifts right/left     General Gait Details: mild imbalance noted, but no true LOB, minguard occasionally given for safety; limited testing due to tech arriving for EEG  Stairs            Wheelchair Mobility    Modified Rankin (Stroke Patients Only)       Balance Overall balance  assessment: Needs assistance   Sitting balance-Leahy Scale: Good       Standing balance-Leahy Scale: Good Standing balance comment: able to place her purse on the floor prior to entering bed, but encouraged to be careful due to SDH                             Pertinent Vitals/Pain Pain Assessment: Faces Faces Pain Scale: Hurts even more Pain Location: L eye/face after fall Pain Descriptors / Indicators: Sore Pain Intervention(s): Monitored during session    Home Living Family/patient expects to be discharged to:: Other (Comment)(possibly alcohol rehab)                      Prior Function Level of Independence: Independent               Hand Dominance        Extremity/Trunk Assessment   Upper Extremity Assessment Upper Extremity Assessment: Overall WFL for tasks assessed    Lower Extremity Assessment Lower Extremity Assessment: Overall WFL for tasks assessed       Communication   Communication: No difficulties  Cognition Arousal/Alertness: Awake/alert Behavior During Therapy: WFL for tasks assessed/performed Overall Cognitive Status: Within Functional Limits for tasks assessed                                        General Comments      Exercises  Assessment/Plan    PT Assessment Patient needs continued PT services  PT Problem List Decreased mobility;Decreased safety awareness;Decreased balance;Decreased knowledge of use of DME;Other (comment)(dizziness)       PT Treatment Interventions Gait training;Therapeutic activities;Patient/family education;Balance training;Functional mobility training;Other (comment)(vestibular rehab)    PT Goals (Current goals can be found in the Care Plan section)  Acute Rehab PT Goals Patient Stated Goal: to go to rehab PT Goal Formulation: With patient Time For Goal Achievement: 12/19/18 Potential to Achieve Goals: Good    Frequency Min 3X/week   Barriers to discharge         Co-evaluation               AM-PAC PT "6 Clicks" Mobility  Outcome Measure Help needed turning from your back to your side while in a flat bed without using bedrails?: None Help needed moving from lying on your back to sitting on the side of a flat bed without using bedrails?: None Help needed moving to and from a bed to a chair (including a wheelchair)?: A Little Help needed standing up from a chair using your arms (e.g., wheelchair or bedside chair)?: A Little Help needed to walk in hospital room?: A Little Help needed climbing 3-5 steps with a railing? : A Little 6 Click Score: 20    End of Session Equipment Utilized During Treatment: Gait belt Activity Tolerance: Patient tolerated treatment well Patient left: in bed;with call bell/phone within reach   PT Visit Diagnosis: Other abnormalities of gait and mobility (R26.89)    Time: 5597-4163 PT Time Calculation (min) (ACUTE ONLY): 18 min   Charges:   PT Evaluation $PT Eval Moderate Complexity: 1 Mod          [name], [facility] [phone] 12/12/2018   [name] 12/12/2018, 4:39 PM

## 2018-12-12 NOTE — Progress Notes (Signed)
PROGRESS NOTE  [name] ZCH:[id number] DOB: 05-11-1956 DOA: 12/11/2018 PCP: Patient, No Pcp Per  Brief History   63 year old woman PMH alcohol dependence, withdrawal seizures, depression, chronic pain presented to the ED department after a seizure in the outpatient setting at a bus stop with a fall and closed head injury striking cement.  We have been attempting to "self detox" and was seen in the emergency department 2/22 and discharged in stable condition at that time.  She was admitted for further evaluation of seizure and treatment of alcohol withdrawal.  A & P  Seizure, presumed secondary to alcohol withdrawal/delirium tremens.  Patient reports history of seizure in the past. --No recurrent seizures thus far.  Minimal withdrawal symptoms.   --Continue Keppra --EEG --Outpatient follow-up with neurology  Traumatic subarachnoid hemorrhage secondary to fall secondary to seizure --CT head shows stability, neurologic exam is nonfocal.  Monitor clinically.  Mild alcoholic hepatitis with elevated transaminases and total bilirubin. --No PT checked but total bilirubin is minimally elevated, good prognosis.  Follow clinically.  Thrombocytopenia probably secondary to myelosuppression from alcohol use  Social. --She reports being homeless.  Social work consultation, case Biochemist, clinical.   Seems to be improving.  Follow-up EEG today.  Continue Keppra.  Continue treatment for withdrawal.  Transfer to telemetry.  DVT prophylaxis: SCDs Code Status: Full Family Communication: none Disposition Plan: home    Brendia Sacks, MD  [facility] Direct contact: see [email or web address]  7PM-7AM contact night coverage as above 12/12/2018, 12:37 PM  LOS: 1 day   Consultants  . Neurosurgery by telephone with EDP  Procedures  .   Antibiotics  .   Interval History/Subjective  Has a headache on the left side pain around her left eye and face from fall.  Otherwise feels okay and  has no other musculoskeletal complaints.  No numbness.  Does feel lightheaded.  Reports that she is homeless, feeling depressed.  Not currently on any medications.  Reports that she had a seizure about 2 years ago.  Objective   Vitals:  Vitals:   12/12/18 0750 12/12/18 0800  BP:  (!) 152/71  Pulse: 65 67  Resp: 18 13  Temp:  97.7 F (36.5 C)  SpO2: 100% 97%    Exam:  Constitutional:  . Appears mildly anxious but comfortable Eyes:  . pupils and irises appear normal . Normal lids  ENMT:  . grossly normal hearing  . Lips appear normal . Bruising to the face on the left noted, forehead, left zygomatic arch, cheek Respiratory:  . CTA bilaterally, no w/r/r.  . Respiratory effort normal.  Cardiovascular:  . RRR, no m/r/g. . Telemetry sinus rhythm . No LE extremity edema   Abdomen:  . Soft, nontender, nondistended Musculoskeletal:  . RUE, LUE, RLE, LLE   o strength and tone normal, no atrophy, no abnormal movements Skin:  . Ecchymosis to the face noted as above Neurologic:  . CN appear grossly intact Psychiatric:  . Mental status o Mood, affect appropriate . judgment and insight appear intact    I have personally reviewed the following:   Today's Data  . Complete metabolic panel notable while for elevated AST, ALT which are stable.  Total bilirubin stable at 1.5. . Hemoglobin 11.7, platelets 125, remainder CBC unremarkable  Lab Data  . Urine drug screen and alcohol were negative  Micro Data  . MRSA PCR negative  Imaging   2/23 CT head, neck, maxillofacial noted.  Small parenchymal contusion left frontal lobe, small volume subarachnoid hemorrhage  without midline shift or hydrocephalus.  Soft tissue contusions left side of the head and face without facial bone fracture.  No acute abnormalities of the cervical spine.  2/24 CT head stable small left frontal intraparenchymal contusion with associated small amount of subarachnoid hemorrhage.  No mass-effect or midline  shift.  Chest x-ray no acute disease  Cardiology Data  . None  Other Data  .   Scheduled Meds: . folic acid  1 mg Oral Daily  . levETIRAcetam  500 mg Oral BID  . mouth rinse  15 mL Mouth Rinse BID  . multivitamin with minerals  1 tablet Oral Daily  . pantoprazole  40 mg Oral Daily  . thiamine  100 mg Oral Daily   Or  . thiamine  100 mg Intravenous Daily   Continuous Infusions:  Principal Problem:   Subarachnoid hemorrhage (HCC) Active Problems:   Alcohol dependence with uncomplicated withdrawal (HCC)   Abdominal pain   Seizure due to alcohol withdrawal (HCC)   LOS: 1 day

## 2018-12-12 NOTE — Care Management Note (Addendum)
Case Management Note  Patient Details  Name: [name] MRN: [id number] Date of Birth: July 23, 1956  Subjective/Objective:                  Discharge Readiness Return to top of Subarachnoid Hemorrhage, Nonsurgical Treatment RRG - ISC  Discharge readiness is indicated by patient meeting Recovery Milestones, including ALL of the following: ? Hemodynamic stability YES ? Pain absent or managed MANAGED ? Mental status at baseline or acceptable for next level of care  ? Neurologic deficit absent or acceptable for next level of care TREMORS AND AGITATION ? Volume or electrolyte abnormalities absent or acceptable for next level of care YES ? Ambulatory[Q] NO ? Oral hydration, medications, and diet ? REGULAR DIET, MEDS  ? Discharge plans and education understood   Action/Plan: Will follow for progression of care and clinical status. Will follow for case management needs none present at this time.  Expected Discharge Date:                  Expected Discharge Plan:     In-House Referral:     Discharge planning Services     Post Acute Care Choice:    Choice offered to:     DME Arranged:    DME Agency:     HH Arranged:    HH Agency:     Status of Service:     If discussed at [employer], dates discussed:    Additional Comments:  Golda Acre, RN 12/12/2018, 10:44 AM

## 2018-12-12 NOTE — Procedures (Signed)
ELECTROENCEPHALOGRAM REPORT   Patient: [name]       Room #: 1418 EEG No. ID: [id number] Age: 64 y.o.        Sex: female Referring Physician: Irene Limbo Report Date:  12/12/2018        Interpreting Physician: Thana Farr  History: [name] is an 63 y.o. female with a history of ETOH abuse presenting with seizure  Medications:  Folic acid, Thiamine, Keppra, MVI, Protonix  Conditions of Recording:  This is a 21 channel routine scalp EEG performed with bipolar and monopolar montages arranged in accordance to the international 10/20 system of electrode placement. One channel was dedicated to EKG recording.  The patient is in the awake state.  Description:  The waking background activity consists of a low voltage, symmetrical, fairly well organized, 10 Hz alpha activity, seen from the parieto-occipital and posterior temporal regions.  Low voltage fast activity, poorly organized, is seen anteriorly and is at times superimposed on more posterior regions.  A mixture of theta and alpha rhythms are seen from the central and temporal regions. The patient does not drowse or sleep. No epileptiform activity is noted.   Hyperventilation and intermittent photic stimulation were not performed.  IMPRESSION: This is a normal awake electroencephalogram. There are no focal lateralizing or epileptiform features.   Thana Farr, MD Neurology [phone] 12/12/2018, 5:33 PM

## 2018-12-12 NOTE — Progress Notes (Signed)
EEG Completed; Results Pending  

## 2018-12-13 DIAGNOSIS — G629 Polyneuropathy, unspecified: Secondary | ICD-10-CM

## 2018-12-13 DIAGNOSIS — F10931 Alcohol use, unspecified with withdrawal delirium: Secondary | ICD-10-CM

## 2018-12-13 DIAGNOSIS — Z5900 Homelessness unspecified: Secondary | ICD-10-CM

## 2018-12-13 DIAGNOSIS — D696 Thrombocytopenia, unspecified: Secondary | ICD-10-CM

## 2018-12-13 DIAGNOSIS — F10939 Alcohol use, unspecified with withdrawal, unspecified: Secondary | ICD-10-CM

## 2018-12-13 DIAGNOSIS — F32A Depression, unspecified: Secondary | ICD-10-CM

## 2018-12-13 DIAGNOSIS — F10231 Alcohol dependence with withdrawal delirium: Secondary | ICD-10-CM

## 2018-12-13 DIAGNOSIS — F329 Major depressive disorder, single episode, unspecified: Secondary | ICD-10-CM

## 2018-12-13 LAB — COMPREHENSIVE METABOLIC PANEL
ALT: 51 U/L — ABNORMAL HIGH (ref 0–44)
AST: 59 U/L — ABNORMAL HIGH (ref 15–41)
Albumin: 3.8 g/dL (ref 3.5–5.0)
Alkaline Phosphatase: 61 U/L (ref 38–126)
Anion gap: 7 (ref 5–15)
BUN: <5 mg/dL — ABNORMAL LOW (ref 8–23)
CO2: 28 mmol/L (ref 22–32)
Calcium: 9.1 mg/dL (ref 8.9–10.3)
Chloride: 103 mmol/L (ref 98–111)
Creatinine, Ser: 0.57 mg/dL (ref 0.44–1.00)
GFR calc Af Amer: >60 mL/min (ref >60)
GFR calc non Af Amer: >60 mL/min (ref >60)
Glucose, Bld: 85 mg/dL (ref 70–99)
Potassium: 2.9 mmol/L — ABNORMAL LOW (ref 3.5–5.1)
Sodium: 138 mmol/L (ref 135–145)
Total Bilirubin: 1.2 mg/dL (ref 0.3–1.2)
Total Protein: 6.3 g/dL — ABNORMAL LOW (ref 6.5–8.1)

## 2018-12-13 LAB — HIV ANTIBODY (ROUTINE TESTING W REFLEX): HIV SCREEN 4TH GENERATION: NONREACTIVE

## 2018-12-13 MED ORDER — POTASSIUM CHLORIDE CRYS ER 20 MEQ PO TBCR
40.0000 meq | EXTENDED_RELEASE_TABLET | ORAL | Status: AC
  Administered 2018-12-13 (×2): 40 meq via ORAL
  Filled 2018-12-13 (×2): qty 2

## 2018-12-13 MED ORDER — GABAPENTIN 300 MG PO CAPS
300.0000 mg | ORAL_CAPSULE | Freq: Once | ORAL | Status: AC
  Administered 2018-12-13: 300 mg via ORAL
  Filled 2018-12-13: qty 1

## 2018-12-13 MED ORDER — DULOXETINE HCL 30 MG PO CPEP
30.0000 mg | ORAL_CAPSULE | Freq: Every day | ORAL | Status: DC
  Administered 2018-12-13 – 2018-12-15 (×3): 30 mg via ORAL
  Filled 2018-12-13 (×3): qty 1

## 2018-12-13 MED ORDER — GABAPENTIN 300 MG PO CAPS
300.0000 mg | ORAL_CAPSULE | Freq: Three times a day (TID) | ORAL | Status: DC
  Administered 2018-12-15 (×2): 300 mg via ORAL
  Filled 2018-12-13 (×2): qty 1

## 2018-12-13 MED ORDER — GABAPENTIN 300 MG PO CAPS
300.0000 mg | ORAL_CAPSULE | Freq: Two times a day (BID) | ORAL | Status: AC
  Administered 2018-12-14 (×2): 300 mg via ORAL
  Filled 2018-12-13 (×2): qty 1

## 2018-12-13 MED ORDER — GABAPENTIN 300 MG PO CAPS: 300.0000 mg | ORAL_CAPSULE | Freq: Three times a day (TID) | ORAL | Status: DC

## 2018-12-13 MED ORDER — GABAPENTIN 300 MG PO CAPS: 300.0000 mg | ORAL_CAPSULE | Freq: Two times a day (BID) | ORAL | Status: DC

## 2018-12-13 NOTE — Progress Notes (Signed)
Spiritual Care Note  Referred by RN with request for shoes and change of clothes from clothes closet. Provided outfit, boots, rain jacket, and courtesy toiletry bag, as well as empathic listening. Tamikia verbalized appreciation for all, finding encouragement in the tangible and emotional support. She is using gratitude, perspective, and hope in a better-supported future to cope.    12/13/18 1200  Clinical Encounter Type  Visited With Patient  Visit Type  (clothes closet request)  Referral From Nurse  Spiritual Encounters  Spiritual Needs Emotional  Stress Factors  Patient Stress Factors Major life changes;Loss of control;Health changes;Financial concerns    Chaplain Hillery Aldo, [facility] 24/7 pager [phone]

## 2018-12-13 NOTE — Evaluation (Signed)
Occupational Therapy Evaluation Patient Details Name: Karri Zola MRN: [id number] DOB: 24-May-1956 Today's Date: 12/13/2018    History of Present Illness  [name] is a 63 y.o. female with medical history significant of Alcohol abuse with frequent alcohol withdrawal seizures who has been trying to self detoxify.  Admitted with fall at bus stop due to seizure.   Clinical Impression   Pt admitted with fall and seizure. Pt currently with functional limitations due to the deficits listed below (see OT Problem List).  Pt will benefit from skilled OT to increase their safety and independence with ADL and functional mobility for ADL to facilitate discharge to venue listed below.      Follow Up Recommendations  Supervision - Intermittent    Equipment Recommendations  None recommended by OT    Recommendations for Other Services       Precautions / Restrictions Precautions Precautions: Fall Precaution Comments: seizure      Mobility Bed Mobility Overal bed mobility: Modified Independent                Transfers Overall transfer level: Needs assistance Equipment used: None Transfers: Sit to/from Stand;Stand Pivot Transfers Sit to Stand: Supervision Stand pivot transfers: Supervision            Balance Overall balance assessment: Needs assistance   Sitting balance-Leahy Scale: Good       Standing balance-Leahy Scale: Good Standing balance comment: able to place her purse on the floor prior to entering bed, but encouraged to be careful due to SDH                           ADL either performed or assessed with clinical judgement   ADL Overall ADL's : Needs assistance/impaired Eating/Feeding: Set up;Sitting   Grooming: Wash/dry face;Set up;Standing   Upper Body Bathing: Set up   Lower Body Bathing: Minimal assistance;Cueing for safety;Cueing for sequencing;Sit to/from stand   Upper Body Dressing : Set up;Sitting   Lower Body Dressing:  Minimal assistance;Sit to/from stand;Cueing for compensatory techniques;Cueing for sequencing;Cueing for safety   Toilet Transfer: Supervision/safety;Cueing for sequencing;Cueing for safety   Toileting- Clothing Manipulation and Hygiene: Supervision/safety;Cueing for safety;Sit to/from stand         General ADL Comments: pt has not A at home and was living at [facility].  Pt concerned they threw away her items.  Pt unsure what her Dc plan will be     Vision Patient Visual Report: No change from baseline              Pertinent Vitals/Pain Pain Score: 3  Pain Location: L eye/face after fall Pain Descriptors / Indicators: Sore Pain Intervention(s): Limited activity within patient's tolerance     Hand Dominance     Extremity/Trunk Assessment Upper Extremity Assessment Upper Extremity Assessment: Overall WFL for tasks assessed           Communication     Cognition Arousal/Alertness: Awake/alert Behavior During Therapy: WFL for tasks assessed/performed Overall Cognitive Status: Within Functional Limits for tasks assessed                                                Home Living Family/patient expects to be discharged to:: Unsure  Prior Functioning/Environment Level of Independence: Independent                 OT Problem List: Decreased strength;Decreased activity tolerance;Impaired balance (sitting and/or standing)      OT Treatment/Interventions: Self-care/ADL training;Patient/family education;DME and/or AE instruction    OT Goals(Current goals can be found in the care plan section) Acute Rehab OT Goals Patient Stated Goal: to go to rehab OT Goal Formulation: With patient Time For Goal Achievement: 12/27/18  OT Frequency: Min 2X/week   Barriers to D/C: Decreased caregiver support       AM-PAC OT "6 Clicks" Daily Activity     Outcome Measure Help from another person eating  meals?: None Help from another person taking care of personal grooming?: None Help from another person toileting, which includes using toliet, bedpan, or urinal?: A Little Help from another person bathing (including washing, rinsing, drying)?: A Little Help from another person to put on and taking off regular upper body clothing?: A Little Help from another person to put on and taking off regular lower body clothing?: A Little 6 Click Score: 20   End of Session Equipment Utilized During Treatment: Gait belt Nurse Communication: Mobility status  Activity Tolerance: Patient tolerated treatment well Patient left: in bed;with bed alarm set;with call bell/phone within reach  OT Visit Diagnosis: Unsteadiness on feet (R26.81);History of falling (Z91.81)                Time:  -    Charges:  OT General Charges $OT Visit: 1 Visit OT Evaluation $OT Eval Low Complexity: 1 Low  Lise Auer, OT Acute Rehabilitation Services [phone] Office- [phone]     Lecretia Buczek, [place] [facility] 12/13/2018, 6:38 PM

## 2018-12-13 NOTE — Progress Notes (Signed)
Nutrition Brief Note  Consult for assessment of nutrition requirements and status.   Wt Readings from Last 15 Encounters:  12/11/18 58.5 kg  11/14/18 64 kg  10/11/18 56.7 kg  09/26/18 58.1 kg  08/08/18 59 kg    Body mass index is 24.37 kg/m. Patient meets criteria for normal weight based on current BMI. Current weight is 129 lb, weight 1/27 was 141 lb, weight 12/24 was 125 lb, weight 12/9 was 128 lb, and weight 10/21 was 130 lb. Question the accuracy of weight recording from 1/27.  Current diet order is Regular. She consumed 80% of breakfast this AM (355 kcal, 11 grams of protein) without any issue. Labs and medications reviewed.   Patient is homeless. History includes alcohol abuse, withdrawal seizures, depression, and chronic pain. Current diagnoses of seizure which is thought to be 2/2 alcohol withdrawal, traumatic subarachnoid hemorrhage 2/2 fall, mild alcoholic hepatitis, and thrombocytopenia.   No nutrition interventions warranted at this time. If nutrition issues arise, please consult RD.     Trenton Gammon, MS, RD, LDN, [facility] Inpatient Clinical Dietitian Pager # [phone] After hours/weekend pager # [phone]

## 2018-12-13 NOTE — Progress Notes (Signed)
PROGRESS NOTE  [name] ZOX:[id number] DOB: 1956-01-20 DOA: 12/11/2018 PCP: Patient, No Pcp Per  Brief History   63 year old woman PMH alcohol dependence, withdrawal seizures, depression, chronic pain presented to the ED department after a seizure in the outpatient setting at a bus stop with a fall and closed head injury striking cement.  We have been attempting to "self detox" and was seen in the emergency department 2/22 and discharged in stable condition at that time.  She was admitted for further evaluation of seizure and treatment of alcohol withdrawal.  A & P  Seizure, presumed secondary to alcohol withdrawal/delirium tremens.  Patient reports history of several seizures in the past associated with alcohol withdrawal. --No recurrence.  EEG was unremarkable --d/w Dr. Wilford Corner neuro hospitalist, given several episodes of seizures in the past, as well as subarachnoid hemorrhage on scan, he does recommend Keppra and outpatient follow-up with neurology.  Traumatic subarachnoid hemorrhage secondary to fall secondary to seizure --Repeat CT head showed stability.  Neurologic exam nonfocal.  No further evaluation suggested.  Alcohol withdrawal, delirium tremens --Improving.  Continue CIWA.  Mild alcoholic hepatitis with elevated transaminases and total bilirubin. --Resolving.  Alcohol use disorder --Patient desires to remain abstinent.  Social work consultation pending.  Thrombocytopenia probably secondary to myelosuppression from alcohol use --Follow-up as an outpatient  Depression --Previously on Cymbalta.  Patient request to restart.  Discussed with Dr. Wilford Corner, okay to resume.  In the antidepressant can lower the seizure threshold but the risk/benefit suggest starting.  Chronic neuropathy --Deviously on gabapentin.  This has been restarted  Social. --Homeless.  Discussed with Child psychotherapist and case management.  Will arrange outpatient follow-up prior to discharge.   Overall  improving.  Anticipate discharge next 48 hours.  DVT prophylaxis: SCDs Code Status: Full Family Communication: none Disposition Plan: home    Brendia Sacks, MD  [facility] Direct contact: see [email or web address]  7PM-7AM contact night coverage as above 12/13/2018, 2:04 PM  LOS: 2 days   Consultants  . Neurosurgery by telephone with EDP  Procedures  . EEG IMPRESSION: This is a normal awake electroencephalogram. There are no focal lateralizing or epileptiform features.  Antibiotics  .   Interval History/Subjective  Overall feels okay but continues to have left-sided head pain and face pain from fall.  Some mild signs of withdrawal per nursing  Objective   Vitals:  Vitals:   12/13/18 0555 12/13/18 1205  BP: 125/77 125/78  Pulse: 68 69  Resp: 20 20  Temp: 98.4 F (36.9 C) 98.5 F (36.9 C)  SpO2: 98% 98%    Exam: Constitutional:   . Appears depressed, anxious Eyes:  . pupils and irises appear normal . Ecchymosis left side of the face, eyelids noted ENMT:  . grossly normal hearing  . Lips appear normal Respiratory:  . CTA bilaterally, no w/r/r.  . Respiratory effort normal.  Cardiovascular:  . RRR, no m/r/g . Telemetry sinus rhythm . No LE extremity edema   Musculoskeletal:  . RUE, LUE, RLE, LLE   o strength and tone normal, no atrophy, no abnormal movements o No tenderness, masses Skin:  . As above Neurologic:  . Grossly nonfocal.  Cranial nerves appear grossly intact. Psychiatric:  . Mental status o Mood depressed, affect appropriate  I have personally reviewed the following:   Today's Data  . Potassium 2.9, AST and ALT trending down.  Total bilirubin is normalized.  Lab Data  . Urine drug screen and alcohol were negative  Micro Data  . MRSA  PCR negative  Imaging   2/23 CT head, neck, maxillofacial noted.  Small parenchymal contusion left frontal lobe, small volume subarachnoid hemorrhage without midline shift or hydrocephalus.  Soft  tissue contusions left side of the head and face without facial bone fracture.  No acute abnormalities of the cervical spine.  2/24 CT head stable small left frontal intraparenchymal contusion with associated small amount of subarachnoid hemorrhage.  No mass-effect or midline shift.  Chest x-ray no acute disease  Cardiology Data  . None  Other Data  .   Scheduled Meds: . DULoxetine  30 mg Oral Daily  . folic acid  1 mg Oral Daily  . gabapentin  300 mg Oral Once   And  . [START ON 12/14/2018] gabapentin  300 mg Oral BID   And  . [START ON 12/15/2018] gabapentin  300 mg Oral TID  . levETIRAcetam  500 mg Oral BID  . mouth rinse  15 mL Mouth Rinse BID  . multivitamin with minerals  1 tablet Oral Daily  . pantoprazole  40 mg Oral Daily  . potassium chloride  40 mEq Oral Q4H  . thiamine  100 mg Oral Daily   Or  . thiamine  100 mg Intravenous Daily   Continuous Infusions:  Principal Problem:   Subarachnoid hemorrhage (HCC) Active Problems:   Alcohol dependence with uncomplicated withdrawal (HCC)   Abdominal pain   Seizure due to alcohol withdrawal (HCC)   LOS: 2 days    Time greater than 35 minutes, greater than 50% counseling patient at bedside, discussion with social work, case management and neurology

## 2018-12-14 LAB — MAGNESIUM: Magnesium: 1.6 mg/dL — ABNORMAL LOW (ref 1.7–2.4)

## 2018-12-14 LAB — BASIC METABOLIC PANEL
Anion gap: 10 (ref 5–15)
BUN: 6 mg/dL — ABNORMAL LOW (ref 8–23)
CO2: 23 mmol/L (ref 22–32)
Calcium: 9.4 mg/dL (ref 8.9–10.3)
Chloride: 101 mmol/L (ref 98–111)
Creatinine, Ser: 0.7 mg/dL (ref 0.44–1.00)
GFR calc Af Amer: >60 mL/min (ref >60)
GFR calc non Af Amer: >60 mL/min (ref >60)
Glucose, Bld: 97 mg/dL (ref 70–99)
Potassium: 4.5 mmol/L (ref 3.5–5.1)
Sodium: 134 mmol/L — ABNORMAL LOW (ref 135–145)

## 2018-12-14 LAB — GLUCOSE, CAPILLARY
GLUCOSE-CAPILLARY: 116 mg/dL — AB (ref 70–99)
Glucose-Capillary: 103 mg/dL — ABNORMAL HIGH (ref 70–99)
Glucose-Capillary: 83 mg/dL (ref 70–99)

## 2018-12-14 MED ORDER — POTASSIUM CHLORIDE CRYS ER 20 MEQ PO TBCR
40.0000 meq | EXTENDED_RELEASE_TABLET | ORAL | Status: DC
  Administered 2018-12-14: 40 meq via ORAL
  Filled 2018-12-14: qty 2

## 2018-12-14 NOTE — Progress Notes (Signed)
Physical Therapy Treatment Patient Details Name: [name] MRN: [id number] DOB: 12/29/1955 Today's Date: 12/14/2018    History of Present Illness  [name] is a 63 y.o. female with medical history significant of Alcohol abuse with frequent alcohol withdrawal seizures who has been trying to self detoxify.  Admitted with fall at bus stop due to seizure.    PT Comments    Pt used bathroom independently and then ambulated in hallway.  Pt appears more steady and denies dizziness with mobility today.  Pt encouraged to ambulate with nursing staff  (for safety due to seizures) during acute stay.   Follow Up Recommendations  No PT follow up     Equipment Recommendations  None recommended by PT    Recommendations for Other Services       Precautions / Restrictions Precautions Precautions: Fall Precaution Comments: seizure    Mobility  Bed Mobility Overal bed mobility: Modified Independent                Transfers Overall transfer level: Modified independent Equipment used: None Transfers: Sit to/from [employer] Sit to Stand: Supervision Stand pivot transfers: Supervision          Ambulation/Gait Ambulation/Gait assistance: Min guard;Supervision Gait Distance (Feet): 340 Feet Assistive device: None Gait Pattern/deviations: Step-through pattern     General Gait Details: no unsteadiness or LOB observed, min/guard for safety initially, pt denies any dizziness with mobility   Stairs             Wheelchair Mobility    Modified Rankin (Stroke Patients Only)       Balance Overall balance assessment: No apparent balance deficits (not formally assessed)(no unsteadiness observed today, no dizziness reported, gait smooth)   Sitting balance-Leahy Scale: Good       Standing balance-Leahy Scale: Good                              Cognition Arousal/Alertness: Awake/alert Behavior During Therapy: WFL for tasks  assessed/performed Overall Cognitive Status: Within Functional Limits for tasks assessed                                        Exercises      General Comments        Pertinent Vitals/Pain Pain Assessment: Faces Pain Score: 2  Faces Pain Scale: Hurts little more Pain Location: L eye/face after fall Pain Descriptors / Indicators: Sore;Tender Pain Intervention(s): Monitored during session;Repositioned    Home Living                      Prior Function            PT Goals (current goals can now be found in the care plan section) Progress towards PT goals: Progressing toward goals    Frequency    Min 3X/week      PT Plan Current plan remains appropriate    Co-evaluation              AM-PAC PT "6 Clicks" Mobility   Outcome Measure  Help needed turning from your back to your side while in a flat bed without using bedrails?: None Help needed moving from lying on your back to sitting on the side of a flat bed without using bedrails?: None Help needed moving to and from a bed to a  chair (including a wheelchair)?: None Help needed standing up from a chair using your arms (e.g., wheelchair or bedside chair)?: A Little Help needed to walk in hospital room?: A Little Help needed climbing 3-5 steps with a railing? : A Little 6 Click Score: 21    End of Session   Activity Tolerance: Patient tolerated treatment well Patient left: in bed;with call bell/phone within reach;with bed alarm set   PT Visit Diagnosis: Other abnormalities of gait and mobility (R26.89)     Time: 9629-5284 PT Time Calculation (min) (ACUTE ONLY): 10 min  Charges:  $Gait Training: 8-22 mins                    [name], PT, DPT Acute Rehabilitation Services Office: [phone] Pager: [phone]  [name] 12/14/2018, 1:05 PM

## 2018-12-14 NOTE — Progress Notes (Signed)
Occupational Therapy Treatment Patient Details Name: [name] MRN: [id number] DOB: Mar 31, 1956 Today's Date: 12/14/2018    History of present illness  Caryl Sidell is a 63 y.o. female with medical history significant of Alcohol abuse with frequent alcohol withdrawal seizures who has been trying to self detoxify.  Admitted with fall at bus stop due to seizure.   OT comments  Pt doing much better this day.  Pt sad regarding lack of Dc plans but is looking forward to discussing with SW  Follow Up Recommendations  Supervision - Intermittent;Home health OT;SNF    Equipment Recommendations  None recommended by OT    Recommendations for Other Services      Precautions / Restrictions Precautions Precautions: Fall Precaution Comments: seizure       Mobility Bed Mobility Overal bed mobility: Modified Independent                Transfers Overall transfer level: Needs assistance Equipment used: None Transfers: Sit to/from [employer] Transfers Sit to Stand: Supervision Stand pivot transfers: Supervision            Balance Overall balance assessment: Needs assistance   Sitting balance-Leahy Scale: Good       Standing balance-Leahy Scale: Good                             ADL either performed or assessed with clinical judgement   ADL Overall ADL's : Needs assistance/impaired Eating/Feeding: Independent   Grooming: Wash/dry face;Set up;Standing   Upper Body Bathing: Set up;Sitting   Lower Body Bathing: Supervison/ safety;Sit to/from stand;Cueing for sequencing;Cueing for safety   Upper Body Dressing : Set up;Sitting   Lower Body Dressing: Supervision/safety;Sit to/from stand;Cueing for sequencing;Cueing for safety       Toileting- Clothing Manipulation and Hygiene: Supervision/safety;Sit to/from stand;Cueing for sequencing;Cueing for safety         General ADL Comments: pt has not A at home and was living at [facility].   Pt unsure  what her Dc plan will be               Cognition Arousal/Alertness: Awake/alert Behavior During Therapy: WFL for tasks assessed/performed Overall Cognitive Status: Within Functional Limits for tasks assessed                                                     Pertinent Vitals/ Pain       Pain Score: 2  Pain Location: L eye/face after fall Pain Descriptors / Indicators: Sore Pain Intervention(s): Limited activity within patient's tolerance;Monitored during session     Prior Functioning/Environment              Frequency  Min 2X/week        Progress Toward Goals  OT Goals(current goals can now be found in the care plan section)  Progress towards OT goals: Progressing toward goals     Plan Discharge plan remains appropriate    Co-evaluation                 AM-PAC OT "6 Clicks" Daily Activity     Outcome Measure   Help from another person eating meals?: None Help from another person taking care of personal grooming?: None Help from another person toileting, which includes using toliet, bedpan, or urinal?:  A Little Help from another person bathing (including washing, rinsing, drying)?: A Little Help from another person to put on and taking off regular upper body clothing?: A Little Help from another person to put on and taking off regular lower body clothing?: A Little 6 Click Score: 20    End of Session    OT Visit Diagnosis: Unsteadiness on feet (R26.81);History of falling (Z91.81)   Activity Tolerance     Patient Left  in chair- with call bell with inreach           Time: 6759-1638 OT Time Calculation (min): 12 min  Charges: OT General Charges $OT Visit: 1 Visit OT Treatments $Self Care/Home Management : 8-22 mins Lise Auer, OT Acute Rehabilitation Services [phone] Office- [phone]      Nylene Inlow, Karin Golden D 12/14/2018, 12:57 PM

## 2018-12-14 NOTE — Progress Notes (Signed)
PROGRESS NOTE  [name] EXN:[id number] DOB: 10-12-1956 DOA: 12/11/2018 PCP: Patient, No Pcp Per   LOS: 3 days   Brief narrative: 63 year old woman PMH alcohol dependence, withdrawal seizures, depression, chronic pain presented to the ED department after a seizure in the outpatient setting at a bus stop with a fall and closed head injury striking cement.  She had  been attempting to "self detox" and was seen in the emergency department 2/22 and discharged in stable condition at that time.  She was admitted for further evaluation of seizure and treatment of alcohol withdrawal.  Subjective: Patient was seen and examined this morning.  Not in distress.  Showed an left orbital area because of underlying contusion and ecchymosis.  Assessment/Plan:  Principal Problem:   Subarachnoid hemorrhage (HCC) Active Problems:   Seizure due to alcohol withdrawal (HCC)   Delirium tremens (HCC)   Thrombocytopenia (HCC)   Homeless   Depression   Neuropathy  Seizure, presumed secondary to alcohol withdrawal/delirium tremens.  Patient reports history of several seizures in the past associated with alcohol withdrawal. --No recurrence.  EEG was unremarkable --d/w Dr. Wilford Corner neuro hospitalist, given several episodes of seizures in the past, as well as subarachnoid hemorrhage on scan, he does recommend Keppra and outpatient follow-up with neurology.  Continue Keppra.  Traumatic subarachnoid hemorrhage secondary to fall secondary to seizure --Repeat CT head showed stability.  Neurologic exam nonfocal.  No further evaluation suggested.  Alcohol withdrawal, delirium tremens --Improving.  Continue CIWA.  Mild alcoholic hepatitis with elevated transaminases and total bilirubin. --Resolving.  Alcohol use disorder --Patient desires to remain abstinent.  Social work consultation obtained.  Resources provided.  Thrombocytopenia probably secondary to myelosuppression from alcohol use --Follow-up as an  outpatient  Depression --Previously on Cymbalta.  Patient request to restart.  Discussed with Dr. Wilford Corner, okay to resume.  In the antidepressant can lower the seizure threshold but the risk/benefit suggest starting.  Chronic neuropathy --Deviously on gabapentin which has been resumed.  Social. --Homeless.  Discussed with Child psychotherapist and case management.  Will arrange outpatient follow-up prior to discharge.  Mobility: Encourage ambulation Diet: Regular diet DVT prophylaxis:  SCDs Code Status:   Code Status: Full Code  Family Communication:  No family at bedside Disposition Plan:  Discharge to shelter tomorrow  Antimicrobials:  Anti-infectives (From admission, onward)   None      Infusions: None   Scheduled Meds: . DULoxetine  30 mg Oral Daily  . folic acid  1 mg Oral Daily  . gabapentin  300 mg Oral BID   And  . [START ON 12/15/2018] gabapentin  300 mg Oral TID  . levETIRAcetam  500 mg Oral BID  . mouth rinse  15 mL Mouth Rinse BID  . multivitamin with minerals  1 tablet Oral Daily  . pantoprazole  40 mg Oral Daily  . thiamine  100 mg Oral Daily   Or  . thiamine  100 mg Intravenous Daily    PRN meds: LORazepam **OR** LORazepam, ondansetron, oxyCODONE   Objective: Vitals:   12/14/18 0539 12/14/18 1342  BP: 126/76 110/73  Pulse: 72 75  Resp: 19 18  Temp: 98.1 F (36.7 C) 98.1 F (36.7 C)  SpO2: 99% 95%    Intake/Output Summary (Last 24 hours) at 12/14/2018 1655 Last data filed at 12/13/2018 2150 Gross per 24 hour  Intake 120 ml  Output 1 ml  Net 119 ml   02/25 0701 - 02/26 0700 In: 720 [P.O.:720] Out: 1 [Urine:1] No intake/output data  recorded. Filed Weights   12/11/18 2215  Weight: 58.5 kg   Body mass index is 24.37 kg/m.   Physical Exam: General exam: Appears calm and comfortable.  Skin: No rashes, lesions or ulcers. Respiratory system: Clear to auscultation bilaterally Cardiovascular system: Regular rate and rhythm, no  murmur Gastrointestinal system: Soft, nontender, nondistended, bowel sound present Central nervous system: Alert, awake, oriented x3 Psychiatry: Mood & affect appropriate.  Extremities: No pedal edema, no calf tenderness  Data Review: I have personally reviewed the laboratory data and studies available.  Recent Labs  Lab 12/10/18 1923 12/11/18 1523 12/12/18 0451  WBC 3.9* 5.8 4.0  NEUTROABS 1.6* 4.7  --   HGB 14.0 12.3 11.7*  HCT 43.6 37.0 35.4*  MCV 103.8* 100.8* 100.6*  PLT 144* 136* 125*   Recent Labs  Lab 12/10/18 1923 12/11/18 1523 12/12/18 0335 12/13/18 0551 12/14/18 1206  NA 137 133* 136 138 134*  K 3.9 3.6 3.5 2.9* 4.5  CL 101 96* 100 103 101  CO2 19* 25 26 28 23   GLUCOSE 84 133* 77 85 97  BUN 8 6* 5* <5* 6*  CREATININE 0.59 0.60 0.53 0.57 0.70  CALCIUM 8.5* 9.0 8.5* 9.1 9.4  MG  --  1.9  --   --  1.6*    Lorin Glass, MD  [facility] 12/14/2018

## 2018-12-15 MED ORDER — ADULT MULTIVITAMIN W/MINERALS CH: 1.0000 | ORAL_TABLET | Freq: Every day | ORAL | 0 refills | Status: AC

## 2018-12-15 MED ORDER — THIAMINE HCL 100 MG PO TABS: 100.0000 mg | ORAL_TABLET | Freq: Every day | ORAL | 0 refills | Status: AC

## 2018-12-15 MED ORDER — MAGNESIUM OXIDE 400 (241.3 MG) MG PO TABS
400.0000 mg | ORAL_TABLET | Freq: Once | ORAL | Status: AC
  Administered 2018-12-15: 400 mg via ORAL
  Filled 2018-12-15: qty 1

## 2018-12-15 MED ORDER — FOLIC ACID 1 MG PO TABS: 1.0000 mg | ORAL_TABLET | Freq: Every day | ORAL | 0 refills | Status: DC

## 2018-12-15 MED ORDER — LEVETIRACETAM 500 MG PO TABS: 500.0000 mg | ORAL_TABLET | Freq: Two times a day (BID) | ORAL | 0 refills | Status: DC

## 2018-12-15 NOTE — Progress Notes (Signed)
Clinical Social Worker following patient for support and discharge needs. CSW met patient at bedside with RN present to discuss discharge to a shelter. CSW gave patient a list of shelters in the area. Patient stated that she receives SSI every month and will receive her check tomorrow. Patient stated she does not want to go to a shelter if its not guaranteed she will get a bed. Patient stated she will not be able to afford a hotel room tonight but will be able to in the morning. Patient asked if she could stay in the ED overnight.   CSW requested that [place] [employer] coverage) come into the room and speak with patient. [place] stated that patient can go into the ED waiting room and sit but stated that patient may or may not be asked to leave. patient stated she is willing to take that chance. CSW explained to patient that if she stay in the ED waiting room she is no longer a patient of the hospital. Patient stated she understands and is willing to just sit in the waiting room until tomorrow. Patient was also encouraged to use resources to find treatment for her substance abuse. CSW signing off.    Rhea Pink, MSW,  [facility]

## 2018-12-15 NOTE — Discharge Instructions (Signed)
Stop alcohol

## 2018-12-15 NOTE — Discharge Summary (Signed)
Physician Discharge Summary  [name] AQT:[id number] DOB: April 15, 1956 DOA: 12/11/2018  PCP: Patient, No Pcp Per  Admit date: 12/11/2018 Discharge date: 12/15/2018  Admitted From: Home Discharge disposition: Homeless shelter   Discharge Diagnosis:   Principal Problem:   Subarachnoid hemorrhage (HCC) Active Problems:   Seizure due to alcohol withdrawal (HCC)   Delirium tremens (HCC)   Thrombocytopenia (HCC)   Homeless   Depression   Neuropathy  Discharge Condition: Improved. Diet recommendation: Regular diet Wound care: None Code status: Full.  History of Present Illness / Brief narrative:   63 year old woman PMH alcohol dependence, withdrawal seizures, depression, chronic pain presented to the ED department after a seizure in the outpatient setting at a bus stop with a fall and closed head injury striking cement. She had  been attempting to "self detox" and was seen in the emergency department 2/22 and discharged in stable condition at that time. She was admitted for further evaluation of seizure and treatment of alcohol withdrawal.   Hospital Course:   Seizure, presumed secondary to alcohol withdrawal/delirium tremens. Patient reports history ofseveral seizures in the past associated with alcohol withdrawal. --No recurrence. EEG was unremarkable --d/w Dr. Claris Pong hospitalist, given several episodes of seizures in the past, as well as subarachnoid hemorrhage on scan, he does recommend Keppra and outpatient follow-up with neurology. Continue Keppra.  Traumatic subarachnoid hemorrhage- secondary to fall secondary to seizure --RepeatCT headshowed stability. Neurologic exam nonfocal. No further evaluation suggested.  Patient has left orbital area ecchymosis and subconjunctival hemorrhage.  No impairment of vision.  Alcohol withdrawal, delirium tremens --Improving. Continue CIWA.  Mild alcoholic hepatitiswith elevated transaminases and total  bilirubin. --Resolving.  Alcohol use disorder --Patient desires to remain abstinent. Social work consultation obtained.  Resources provided.  Thrombocytopeniaprobably secondary to myelosuppression from alcohol use --Follow-up as an outpatient  Depression --Previously on Cymbalta. Patient request to restart. Discussed with Dr. Wilford Corner, okay to resume. In the antidepressant can lower the seizure threshold but the risk/benefit suggest starting.  Chronic neuropathy --Previously on gabapentin which has been resumed.  Social. --Homeless. Discussed with Child psychotherapist and case management. Will arrange outpatient follow-up prior to discharge.   Medical Consultants:    None.   Discharge Exam:   Vitals:   12/14/18 2253 12/15/18 0454  BP: 126/82 122/72  Pulse: 68 74  Resp: 16 14  Temp: 98.4 F (36.9 C) 97.7 F (36.5 C)  SpO2: 100% 97%   Vitals:   12/14/18 0539 12/14/18 1342 12/14/18 2253 12/15/18 0454  BP: 126/76 110/73 126/82 122/72  Pulse: 72 75 68 74  Resp: 19 18 16 14   Temp: 98.1 F (36.7 C) 98.1 F (36.7 C) 98.4 F (36.9 C) 97.7 F (36.5 C)  TempSrc: Oral Oral Oral Oral  SpO2: 99% 95% 100% 97%  Weight:      Height:        General exam: Appears calm and comfortable.  Skin: No rashes, lesions or ulcers. HEENT: Left orbital area ecchymosis, left eye subconjunctival hemorrhage.  No impairment of vision Respiratory system: Clear to auscultation bilaterally Cardiovascular system: Regular rate and rhythm, no murmur Gastrointestinal system: Soft, nontender, nondistended, bowel sound present Central nervous system: Alert, awake monitor x3 Psychiatry: Mood & affect appropriate.  Extremities: No pedal edema, no calf tenderness   The results of significant diagnostics from this hospitalization (including imaging, microbiology, ancillary and laboratory) are listed below for reference.    Procedures and Diagnostic Studies:   Ct Head Wo Contrast  Result Date:  12/12/2018 CLINICAL  DATA:  Subarachnoid hemorrhage. EXAM: CT HEAD WITHOUT CONTRAST TECHNIQUE: Contiguous axial images were obtained from the base of the skull through the vertex without intravenous contrast. COMPARISON:  CT scan of December 11, 2018. FINDINGS: Brain: Mild diffuse cortical atrophy is noted. Mild chronic ischemic white matter disease is noted. Stable small left frontal contusion is noted. Stable small amount of associated subarachnoid hemorrhage. No mass effect or midline shift is noted. Ventricular size is within normal limits. No acute infarction or mass lesion is noted. Vascular: No hyperdense vessel or unexpected calcification. Skull: Normal. Negative for fracture or focal lesion. Sinuses/Orbits: No acute finding. Other: Stable contusion involving soft tissues overlying left frontal scalp. IMPRESSION: Stable small left frontal intraparenchymal contusion is noted with associated small amount of subarachnoid hemorrhage. Mild diffuse cortical atrophy and mild chronic ischemic white matter disease is noted as well. Electronically Signed   By: Lupita Raider, M.D.   On: 12/12/2018 08:27     Labs:   Basic Metabolic Panel: Recent Labs  Lab 12/10/18 1923 12/11/18 1523 12/12/18 0335 12/13/18 0551 12/14/18 1206  NA 137 133* 136 138 134*  K 3.9 3.6 3.5 2.9* 4.5  CL 101 96* 100 103 101  CO2 19* GLUCOSE 84 133* 77 85 97  BUN 8 6* 5* <5* 6*  CREATININE 0.59 0.60 0.53 0.57 0.70  CALCIUM 8.5* 9.0 8.5* 9.1 9.4  MG  --  1.9  --   --  1.6*   GFR Estimated Creatinine Clearance: 59.2 mL/min (by C-G formula based on SCr of 0.7 mg/dL). Liver Function Tests: Recent Labs  Lab 12/10/18 1923 12/11/18 1523 12/12/18 0335 12/13/18 0551  AST 223* 147* 103* 59*  ALT 107* 88* 68* 51*  ALKPHOS 79 69 62 61  BILITOT 0.7 1.5* 1.4* 1.2  PROT 7.3 7.1 6.3* 6.3*  ALBUMIN 4.7 4.4 3.8 3.8   Recent Labs  Lab 12/10/18 1923  LIPASE 41   No results for input(s): AMMONIA in the last 168  hours. Coagulation profile No results for input(s): INR, PROTIME in the last 168 hours.  CBC: Recent Labs  Lab 12/10/18 1923 12/11/18 1523 12/12/18 0451  WBC 3.9* 5.8 4.0  NEUTROABS 1.6* 4.7  --   HGB 14.0 12.3 11.7*  HCT 43.6 37.0 35.4*  MCV 103.8* 100.8* 100.6*  PLT 144* 136* 125*   Cardiac Enzymes: No results for input(s): CKTOTAL, CKMB, CKMBINDEX, TROPONINI in the last 168 hours. BNP: Invalid input(s): POCBNP CBG: Recent Labs  Lab 12/11/18 1550 12/14/18 0748 12/14/18 1156 12/14/18 1638  GLUCAP 141* 103* 83 116*   D-Dimer No results for input(s): DDIMER in the last 72 hours. Hgb A1c No results for input(s): HGBA1C in the last 72 hours. Lipid Profile No results for input(s): CHOL, HDL, LDLCALC, TRIG, CHOLHDL, LDLDIRECT in the last 72 hours. Thyroid function studies No results for input(s): TSH, T4TOTAL, T3FREE, THYROIDAB in the last 72 hours.  Invalid input(s): FREET3 Anemia work up No results for input(s): VITAMINB12, FOLATE, FERRITIN, TIBC, IRON, RETICCTPCT in the last 72 hours. Microbiology Recent Results (from the past 240 hour(s))  MRSA PCR Screening     Status: None   Collection Time: 12/11/18  8:28 PM  Result Value Ref Range Status   MRSA by PCR NEGATIVE NEGATIVE Final    Comment:        The GeneXpert MRSA Assay (FDA approved for NASAL specimens only), is one component of a comprehensive MRSA colonization surveillance program. It is not intended to  diagnose MRSA infection nor to guide or monitor treatment for MRSA infections. Performed at [facility] [address], [place], [place] [address]      Discharge Instructions:   Discharge Instructions    Ambulatory referral to Neurology   Complete by:  As directed    An appointment is requested in approximately: 4 weeks to follow-up seizure   Diet - low sodium heart healthy   Complete by:  As directed    Increase activity slowly   Complete by:  As directed       Allergies as of 12/15/2018      Reactions   Ketorolac Hives   Tramadol Hives      Medication List    STOP taking these medications   chlordiazePOXIDE 25 MG capsule Commonly known as:  LIBRIUM   metoprolol succinate 25 MG 24 hr tablet Commonly known as:  TOPROL-XL     TAKE these medications   DULoxetine 30 MG capsule Commonly known as:  CYMBALTA Take 1 capsule (30 mg total) by mouth daily. For depression   folic acid 1 MG tablet Commonly known as:  FOLVITE Take 1 tablet (1 mg total) by mouth daily for 30 days. Start taking on:  December 16, 2018   gabapentin 400 MG capsule Commonly known as:  NEURONTIN Take 1 capsule (400 mg total) by mouth 3 (three) times daily. For agitation   hydrOXYzine 25 MG tablet Commonly known as:  ATARAX/VISTARIL Take 1 tablet (25 mg total) by mouth every 6 (six) hours as needed. For anxiety   levETIRAcetam 500 MG tablet Commonly known as:  KEPPRA Take 1 tablet (500 mg total) by mouth 2 (two) times daily for 30 days.   multivitamin with minerals Tabs tablet Take 1 tablet by mouth daily for 30 days. Start taking on:  December 16, 2018   nicotine 21 mg/24hr patch Commonly known as:  NICODERM CQ - dosed in mg/24 hours Place 1 patch (21 mg total) onto the skin daily. (May buy from over the counter): Smoking cessation   ondansetron 4 MG disintegrating tablet Commonly known as:  ZOFRAN ODT Take 1 tablet (4 mg total) by mouth every 8 (eight) hours as needed for nausea or vomiting.   pantoprazole 20 MG tablet Commonly known as:  PROTONIX Take 1 tablet (20 mg total) by mouth daily. For acid reflux   thiamine 100 MG tablet Take 1 tablet (100 mg total) by mouth daily for 30 days. Start taking on:  December 16, 2018       Time coordinating discharge: 39 minutes  Signed:  Alexandros Ewan  [facility] 12/15/2018, 11:56 AM

## 2018-12-16 ENCOUNTER — Emergency Department (HOSPITAL_COMMUNITY)
Admission: EM | Admit: 2018-12-16 | Discharge: 2018-12-17 | Disposition: A | Discharge status: Other Acute Inpatient Hospital | Payer: Medicaid Other | Attending: Emergency Medicine | Admitting: Emergency Medicine

## 2018-12-16 ENCOUNTER — Encounter (HOSPITAL_COMMUNITY): Payer: Self-pay

## 2018-12-16 ENCOUNTER — Emergency Department (HOSPITAL_COMMUNITY): Payer: Medicaid Other

## 2018-12-16 ENCOUNTER — Other Ambulatory Visit: Payer: Self-pay

## 2018-12-16 DIAGNOSIS — Z79899 Other long term (current) drug therapy: Secondary | ICD-10-CM | POA: Diagnosis not present

## 2018-12-16 DIAGNOSIS — R51 Headache: Secondary | ICD-10-CM | POA: Diagnosis present

## 2018-12-16 DIAGNOSIS — F32A Depression, unspecified: Secondary | ICD-10-CM

## 2018-12-16 DIAGNOSIS — F332 Major depressive disorder, 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 psychotic features: Secondary | ICD-10-CM | POA: Diagnosis present

## 2018-12-16 DIAGNOSIS — F17210 Nicotine dependence, cigarettes, uncomplicated: Secondary | ICD-10-CM | POA: Diagnosis not present

## 2018-12-16 DIAGNOSIS — W010XXD Fall on same level from slipping, tripping and stumbling without subsequent striking against object, subsequent encounter: Secondary | ICD-10-CM | POA: Insufficient documentation

## 2018-12-16 DIAGNOSIS — S0083XD Contusion of other part of head, subsequent encounter: Secondary | ICD-10-CM | POA: Diagnosis not present

## 2018-12-16 DIAGNOSIS — F1020 Alcohol dependence, uncomplicated: Secondary | ICD-10-CM | POA: Diagnosis not present

## 2018-12-16 DIAGNOSIS — R45851 Suicidal ideations: Secondary | ICD-10-CM | POA: Diagnosis not present

## 2018-12-16 DIAGNOSIS — F333 Major depressive disorder, recurrent, severe with psychotic symptoms: Secondary | ICD-10-CM | POA: Insufficient documentation

## 2018-12-16 DIAGNOSIS — Z5900 Homelessness unspecified: Secondary | ICD-10-CM

## 2018-12-16 DIAGNOSIS — F329 Major depressive disorder, single episode, unspecified: Secondary | ICD-10-CM

## 2018-12-16 DIAGNOSIS — Z634 Disappearance and death of family member: Secondary | ICD-10-CM | POA: Insufficient documentation

## 2018-12-16 DIAGNOSIS — F1024 Alcohol dependence with alcohol-induced mood disorder: Secondary | ICD-10-CM | POA: Diagnosis present

## 2018-12-16 LAB — COMPREHENSIVE METABOLIC PANEL
ALT: 45 U/L — AB (ref 0–44)
AST: 46 U/L — ABNORMAL HIGH (ref 15–41)
Albumin: 4.2 g/dL (ref 3.5–5.0)
Alkaline Phosphatase: 57 U/L (ref 38–126)
Anion gap: 7 (ref 5–15)
BUN: 7 mg/dL — ABNORMAL LOW (ref 8–23)
CO2: 29 mmol/L (ref 22–32)
CREATININE: 0.67 mg/dL (ref 0.44–1.00)
Calcium: 9.8 mg/dL (ref 8.9–10.3)
Chloride: 101 mmol/L (ref 98–111)
GFR calc non Af Amer: >60 mL/min (ref >60)
Glucose, Bld: 90 mg/dL (ref 70–99)
Potassium: 3.4 mmol/L — ABNORMAL LOW (ref 3.5–5.1)
Sodium: 137 mmol/L (ref 135–145)
Total Bilirubin: 0.6 mg/dL (ref 0.3–1.2)
Total Protein: 7.1 g/dL (ref 6.5–8.1)

## 2018-12-16 LAB — SALICYLATE LEVEL

## 2018-12-16 LAB — RAPID URINE DRUG SCREEN, HOSP PERFORMED
Amphetamines: NOT DETECTED
Barbiturates: NOT DETECTED
Benzodiazepines: NOT DETECTED
Cocaine: NOT DETECTED
Opiates: NOT DETECTED
Tetrahydrocannabinol: NOT DETECTED

## 2018-12-16 LAB — CBC
HCT: 36.8 % (ref 36.0–46.0)
Hemoglobin: 11.9 g/dL — ABNORMAL LOW (ref 12.0–15.0)
MCH: 33.7 pg (ref 26.0–34.0)
MCHC: 32.3 g/dL (ref 30.0–36.0)
MCV: 104.2 fL — ABNORMAL HIGH (ref 80.0–100.0)
Platelets: 190 10*3/uL (ref 150–400)
RBC: 3.53 MIL/uL — ABNORMAL LOW (ref 3.87–5.11)
RDW: 13.3 % (ref 11.5–15.5)
WBC: 4.4 10*3/uL (ref 4.0–10.5)
nRBC: 0 % (ref 0.0–0.2)

## 2018-12-16 LAB — ACETAMINOPHEN LEVEL: Acetaminophen (Tylenol), Serum: <10 ug/mL — ABNORMAL LOW (ref 10–30)

## 2018-12-16 LAB — ETHANOL: Alcohol, Ethyl (B): <10 mg/dL (ref <10)

## 2018-12-16 MED ORDER — LEVETIRACETAM 500 MG PO TABS
500.0000 mg | ORAL_TABLET | Freq: Two times a day (BID) | ORAL | Status: DC
  Administered 2018-12-16 – 2018-12-17 (×2): 500 mg via ORAL
  Filled 2018-12-16 (×2): qty 1

## 2018-12-16 MED ORDER — ONDANSETRON HCL 4 MG/2ML IJ SOLN
4.0000 mg | Freq: Once | INTRAMUSCULAR | Status: AC
  Administered 2018-12-16: 4 mg via INTRAVENOUS
  Filled 2018-12-16: qty 2

## 2018-12-16 MED ORDER — LORAZEPAM 1 MG PO TABS
0.0000 mg | ORAL_TABLET | Freq: Four times a day (QID) | ORAL | Status: DC
  Administered 2018-12-17: 1 mg via ORAL
  Filled 2018-12-16: qty 1

## 2018-12-16 MED ORDER — ACETAMINOPHEN 325 MG PO TABS
650.0000 mg | ORAL_TABLET | Freq: Once | ORAL | Status: AC
  Administered 2018-12-16: 650 mg via ORAL
  Filled 2018-12-16: qty 2

## 2018-12-16 MED ORDER — ONDANSETRON 4 MG PO TBDP
4.0000 mg | ORAL_TABLET | Freq: Three times a day (TID) | ORAL | Status: DC | PRN
  Administered 2018-12-16: 4 mg via ORAL
  Filled 2018-12-16: qty 1

## 2018-12-16 MED ORDER — VITAMIN B-1 100 MG PO TABS
100.0000 mg | ORAL_TABLET | Freq: Every day | ORAL | Status: DC
  Administered 2018-12-16 – 2018-12-17 (×2): 100 mg via ORAL
  Filled 2018-12-16 (×2): qty 1

## 2018-12-16 MED ORDER — LORAZEPAM 2 MG/ML IJ SOLN
0.0000 mg | Freq: Four times a day (QID) | INTRAMUSCULAR | Status: DC
  Administered 2018-12-16: 2 mg via INTRAVENOUS
  Filled 2018-12-16: qty 1

## 2018-12-16 MED ORDER — THIAMINE HCL 100 MG/ML IJ SOLN
100.0000 mg | Freq: Every day | INTRAMUSCULAR | Status: DC
  Filled 2018-12-16: qty 2

## 2018-12-16 MED ORDER — LORAZEPAM 1 MG PO TABS: 0.0000 mg | ORAL_TABLET | Freq: Two times a day (BID) | ORAL | Status: DC

## 2018-12-16 MED ORDER — LORAZEPAM 2 MG/ML IJ SOLN: 0.0000 mg | Freq: Two times a day (BID) | INTRAMUSCULAR | Status: DC

## 2018-12-16 NOTE — ED Triage Notes (Addendum)
Patient states "I don't want to live anymore. I don't have anyone." Patient states she last drank alcohol 4 days ago. Patient c/o headache and states she had a subdural hematoma recently. Patient states she has called [facility], [facility], and [facility] and no one will help me. Patient states her plan is take sleeping pills and drink lots of alcohol. Patient denies HI, visual or auditory hallucinations.

## 2018-12-16 NOTE — ED Notes (Signed)
Bed: [facility] Expected date:  Expected time:  Means of arrival:  Comments: Hold for triage 4
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
## 2018-12-16 NOTE — ED Provider Notes (Signed)
[facility]-EMERGENCY DEPT Provider Note   CSN: [id number] Arrival date & time: 12/16/18  1824    History   Chief Complaint Chief Complaint  Patient presents with  . Suicidal  . Headache    HPI [name] is a 63 y.o. female with a past medical history alcohol abuse, depression, anxiety, prior subarachnoid hemorrhage diagnosed on 12/10/2018 after head injury presents to ED for headache and suicidal ideation.  Patient states that she continues to have a headache since her fall.  She has not been taking medications at home to help with her symptoms.  Her last alcoholic drink was 4 days ago.  She Ethiopia suicide he should with the plan of wanting to "drink a lot and take a lot of pills."  States that she has been drinking daily since the death of her mother and her brother.  She states that her son is not speaking to her and that her daughter is going through health issues. States "I've hit rock bottom."  She states that she is also been having visual hallucinations for the past 2 days.  She denies any HI, AH, tremors, seizures, additional head injuries or falls.     HPI  Past Medical History:  Diagnosis Date  . Alcohol dependence (HCC)   . Anxiety   . Chronic pain   . Depression   . Herniated cervical disc     Patient Active Problem List   Diagnosis Date Noted  . Delirium tremens (HCC) 12/13/2018  . Thrombocytopenia (HCC) 12/13/2018  . Homeless 12/13/2018  . Depression 12/13/2018  . Neuropathy 12/13/2018  . Subarachnoid hemorrhage (HCC) 12/11/2018  . Seizure due to alcohol withdrawal (HCC) 12/11/2018  . Alcohol dependence with alcohol-induced mood disorder (HCC)   . 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 Major depressive disorder, 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    Past Surgical History:  Procedure Laterality Date  . ABDOMINAL HYSTERECTOMY    . ANKLE SURGERY Right   .  CARPAL TUNNEL RELEASE Bilateral   . CERVICAL FUSION    . CHOLECYSTECTOMY    . KNEE SURGERY       OB History   No obstetric history on file.      Home Medications    Prior to Admission medications   Medication Sig Start Date End Date Taking? Authorizing Provider  DULoxetine (CYMBALTA) 30 MG capsule Take 1 capsule (30 mg total) by mouth daily. For depression Patient not taking: Reported on 12/10/2018 11/26/18   Ward, Chase Picket, PA-C  folic acid (FOLVITE) 1 MG tablet Take 1 tablet (1 mg total) by mouth daily for 30 days. 12/16/18 01/15/19  Lorin Glass, MD  gabapentin (NEURONTIN) 400 MG capsule Take 1 capsule (400 mg total) by mouth 3 (three) times daily. For agitation Patient not taking: Reported on 12/10/2018 11/26/18   Ward, Chase Picket, PA-C  hydrOXYzine (ATARAX/VISTARIL) 25 MG tablet Take 1 tablet (25 mg total) by mouth every 6 (six) hours as needed. For anxiety Patient not taking: Reported on 12/10/2018 11/26/18   Ward, Chase Picket, PA-C  levETIRAcetam (KEPPRA) 500 MG tablet Take 1 tablet (500 mg total) by mouth 2 (two) times daily for 30 days. 12/15/18 01/14/19  Lorin Glass, MD  Multiple Vitamin (MULTIVITAMIN WITH MINERALS) TABS tablet Take 1 tablet by mouth daily for 30 days. 12/16/18 01/15/19  Lorin Glass, MD  nicotine (NICODERM CQ - DOSED IN MG/24 HOURS) 21 mg/24hr patch Place 1 patch (21 mg total)  onto the skin daily. (May buy from over the counter): Smoking cessation Patient not taking: Reported on 12/10/2018 10/14/18   Armandina Stammer I, NP  ondansetron (ZOFRAN ODT) 4 MG disintegrating tablet Take 1 tablet (4 mg total) by mouth every 8 (eight) hours as needed for nausea or vomiting. 12/10/18   Street, [place], PA-C  pantoprazole (PROTONIX) 20 MG tablet Take 1 tablet (20 mg total) by mouth daily. For acid reflux Patient not taking: Reported on 12/10/2018 10/14/18   Armandina Stammer I, NP  thiamine 100 MG tablet Take 1 tablet (100 mg total) by mouth daily for 30 days. 12/16/18 01/15/19   Lorin Glass, MD    Family History History reviewed. No pertinent family history.  Social History Social History   Tobacco Use  . Smoking status: Current Every Day Smoker    Packs/day: 0.50    Years: 7.00    Pack years: 3.50    Types: Cigarettes  . Smokeless tobacco: Never Used  Substance Use Topics  . Alcohol use: Yes    Alcohol/week: 3.0 standard drinks    Types: 3 Standard drinks or equivalent per week    Comment: daily use.  . Drug use: Yes    Types: Marijuana     Allergies   Ketorolac and Tramadol   Review of Systems Review of Systems  Constitutional: Negative for appetite change, chills and fever.  HENT: Negative for ear pain, rhinorrhea, sneezing and sore throat.   Eyes: Negative for photophobia and visual disturbance.  Respiratory: Negative for cough, chest tightness, shortness of breath and wheezing.   Cardiovascular: Negative for chest pain and palpitations.  Gastrointestinal: Negative for abdominal pain, blood in stool, constipation, diarrhea, nausea and vomiting.  Genitourinary: Negative for dysuria, hematuria and urgency.  Musculoskeletal: Negative for myalgias.  Skin: Negative for rash.  Neurological: Positive for headaches. Negative for dizziness, weakness and light-headedness.  Psychiatric/Behavioral: Positive for suicidal ideas. The patient is nervous/anxious.      Physical Exam Updated Vital Signs BP (!) 158/92   Pulse (!) 101   Temp 98.4 F (36.9 C)   Resp 16   Ht 5\' 1"  (1.549 m)   Wt 58.5 kg   SpO2 100%   BMI 24.37 kg/m   Physical Exam Vitals signs and nursing note reviewed.  Constitutional:      General: She is not in acute distress.    Appearance: She is well-developed.  HENT:     Head: Normocephalic.     Comments: Hematoma to left temple.    Nose: Nose normal.  Eyes:     General: No scleral icterus.       Right eye: No discharge.        Left eye: No discharge.     Conjunctiva/sclera: Conjunctivae normal.     Pupils:  Pupils are equal, round, and reactive to light.  Neck:     Musculoskeletal: Normal range of motion and neck supple.  Cardiovascular:     Rate and Rhythm: Normal rate and regular rhythm.     Heart sounds: Normal heart sounds. No murmur. No friction rub. No gallop.   Pulmonary:     Effort: Pulmonary effort is normal. No respiratory distress.     Breath sounds: Normal breath sounds.  Abdominal:     General: Bowel sounds are normal. There is no distension.     Palpations: Abdomen is soft.     Tenderness: There is no abdominal tenderness. There is no guarding.  Musculoskeletal: Normal range of motion.  Skin:  General: Skin is warm and dry.     Findings: No rash.  Neurological:     Mental Status: She is alert.     Motor: No abnormal muscle tone.     Coordination: Coordination normal.  Psychiatric:        Attention and Perception: She does not perceive auditory or visual hallucinations.        Mood and Affect: Mood is depressed.        Thought Content: Thought content includes suicidal ideation. Thought content includes suicidal plan.      ED Treatments / Results  Labs (all labs ordered are listed, but only abnormal results are displayed) Labs Reviewed  COMPREHENSIVE METABOLIC PANEL - Abnormal; Notable for the following components:      Result Value   Potassium 3.4 (*)    BUN 7 (*)    AST 46 (*)    ALT 45 (*)    All other components within normal limits  ACETAMINOPHEN LEVEL - Abnormal; Notable for the following components:   Acetaminophen (Tylenol), Serum <10 (*)    All other components within normal limits  CBC - Abnormal; Notable for the following components:   RBC 3.53 (*)    Hemoglobin 11.9 (*)    MCV 104.2 (*)    All other components within normal limits  ETHANOL  SALICYLATE LEVEL  RAPID URINE DRUG SCREEN, HOSP PERFORMED    EKG None  Radiology Ct Head Wo Contrast  Result Date: 12/16/2018 CLINICAL DATA:  Headache EXAM: CT HEAD WITHOUT CONTRAST TECHNIQUE:  Contiguous axial images were obtained from the base of the skull through the vertex without intravenous contrast. COMPARISON:  12/12/2018 FINDINGS: Brain: Previously seen small anterior left frontal intraparenchymal hemorrhage again noted. No new hemorrhage, hydrocephalus or midline shift. Mild cerebral atrophy. Vascular: No hyperdense vessel or unexpected calcification. Skull: No acute calvarial abnormality. Sinuses/Orbits: Visualized paranasal sinuses and mastoids clear. Orbital soft tissues unremarkable. Other: None IMPRESSION: Stable small left frontal intraparenchymal hematoma. No new hemorrhage. Electronically Signed   By: Charlett Nose M.D.   On: 12/16/2018 20:43    Procedures Procedures (including critical care time)  Medications Ordered in ED Medications  LORazepam (ATIVAN) injection 0-4 mg (2 mg Intravenous Given 12/16/18 2018)    Or  LORazepam (ATIVAN) tablet 0-4 mg ( Oral See Alternative 12/16/18 2018)  LORazepam (ATIVAN) injection 0-4 mg (has no administration in time range)    Or  LORazepam (ATIVAN) tablet 0-4 mg (has no administration in time range)  thiamine (VITAMIN B-1) tablet 100 mg (100 mg Oral Given 12/16/18 2017)    Or  thiamine (B-1) injection 100 mg ( Intravenous See Alternative 12/16/18 2017)  acetaminophen (TYLENOL) tablet 650 mg (has no administration in time range)  ondansetron (ZOFRAN) injection 4 mg (4 mg Intravenous Given 12/16/18 2018)     Initial Impression / Assessment and Plan / ED Course  I have reviewed the triage vital signs and the nursing notes.  Pertinent labs & imaging results that were available during my care of the patient were reviewed by me and considered in my medical decision making (see chart for details).        63 year old female with a past medical history of alcohol abuse presents to ED for suicidal ideation and continued headache.  Patient seen and evaluated last week after mechanical fall.  She was found to have a subarachnoid  hemorrhage.  She states that she was not discharged home with pain medication.  She endorses suicidal ideation with a  plan of wanting to "drink and take pills."  Her last drink was 4 days ago.  She states that she has been having visual hallucinations but is not currently hallucinating.  Denies any homicidal ideations.  Denies any subsequent head injury, changes sensation, chest pain, tremors or seizures.  On my exam patient is tearful speaking about her worsening depression related to passing of family members and health issues with her children.  No deficits on neurological exam noted.  She is requesting "a shot of pain medicine and something for nausea."  CT of the head is negative for acute abnormality.  EtOH level here is less than 10.  Remainder medical screening lab work is similar to baseline.  Patient is medically cleared for TTS evaluation.    9:39 PM TTS recommends evaluation in the morning by NP.  Please see their note for further detail.    Portions of this note were generated with Scientist, clinical (histocompatibility and immunogenetics). Dictation errors may occur despite best attempts at proofreading.   Final Clinical Impressions(s) / ED Diagnoses   Final diagnoses:  Suicidal ideation    ED Discharge Orders    None       Dietrich Pates, PA-C 12/16/18 2141    Sabas Sous, MD 12/16/18 2246

## 2018-12-16 NOTE — [facility] Counselor (Addendum)
Clinician asked the pt if she could contact family, friend supports to obtain collateral information. Pt declined due to no supports.   Clinician attempted to call pt's nurse to discuss pt's disposition.   Redmond Pulling, MS, [facility], [facility] Triage Specialist [phone]

## 2018-12-17 ENCOUNTER — Encounter (HOSPITAL_COMMUNITY): Payer: Self-pay

## 2018-12-17 ENCOUNTER — Inpatient Hospital Stay (HOSPITAL_COMMUNITY)
Admission: AD | Admit: 2018-12-17 | Discharge: 2018-12-27 | DRG: 885 | Disposition: A | Discharge status: Home / Self Care | Payer: Medicaid Other | Source: Intra-hospital | Attending: Psychiatry | Admitting: Psychiatry

## 2018-12-17 DIAGNOSIS — W1839XD Other fall on same level, subsequent encounter: Secondary | ICD-10-CM

## 2018-12-17 DIAGNOSIS — Z590 Homelessness: Secondary | ICD-10-CM

## 2018-12-17 DIAGNOSIS — S065X9D Traumatic subdural hemorrhage with loss of consciousness of unspecified duration, subsequent encounter: Secondary | ICD-10-CM

## 2018-12-17 DIAGNOSIS — I609 Nontraumatic subarachnoid hemorrhage, unspecified: Secondary | ICD-10-CM

## 2018-12-17 DIAGNOSIS — F332 Major depressive disorder, 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 psychotic features: Principal | ICD-10-CM | POA: Diagnosis present

## 2018-12-17 DIAGNOSIS — R45851 Suicidal ideations: Secondary | ICD-10-CM | POA: Diagnosis present

## 2018-12-17 DIAGNOSIS — Z9049 Acquired absence of other specified parts of digestive tract: Secondary | ICD-10-CM | POA: Diagnosis not present

## 2018-12-17 DIAGNOSIS — G4700 Insomnia, unspecified: Secondary | ICD-10-CM | POA: Diagnosis present

## 2018-12-17 DIAGNOSIS — F1024 Alcohol dependence with alcohol-induced mood disorder: Secondary | ICD-10-CM | POA: Diagnosis present

## 2018-12-17 DIAGNOSIS — Z888 Allergy status to other drugs, medicaments and biological substances status: Secondary | ICD-10-CM

## 2018-12-17 DIAGNOSIS — Z885 Allergy status to narcotic agent status: Secondary | ICD-10-CM

## 2018-12-17 DIAGNOSIS — Z981 Arthrodesis status: Secondary | ICD-10-CM | POA: Diagnosis not present

## 2018-12-17 DIAGNOSIS — F1290 Cannabis use, unspecified, uncomplicated: Secondary | ICD-10-CM | POA: Diagnosis present

## 2018-12-17 DIAGNOSIS — Z79899 Other long term (current) drug therapy: Secondary | ICD-10-CM

## 2018-12-17 DIAGNOSIS — R51 Headache: Secondary | ICD-10-CM | POA: Diagnosis not present

## 2018-12-17 DIAGNOSIS — G8929 Other chronic pain: Secondary | ICD-10-CM | POA: Diagnosis present

## 2018-12-17 DIAGNOSIS — K219 Gastro-esophageal reflux disease without esophagitis: Secondary | ICD-10-CM | POA: Diagnosis present

## 2018-12-17 DIAGNOSIS — F419 Anxiety disorder, unspecified: Secondary | ICD-10-CM | POA: Diagnosis present

## 2018-12-17 DIAGNOSIS — Z90710 Acquired absence of both cervix and uterus: Secondary | ICD-10-CM

## 2018-12-17 MED ORDER — VITAMIN B-1 100 MG PO TABS
100.0000 mg | ORAL_TABLET | Freq: Every day | ORAL | Status: DC
  Administered 2018-12-18 – 2018-12-27 (×10): 100 mg via ORAL
  Filled 2018-12-17 (×13): qty 1

## 2018-12-17 MED ORDER — ALUM & MAG HYDROXIDE-SIMETH 200-200-20 MG/5ML PO SUSP: 30.0000 mL | ORAL | Status: DC | PRN

## 2018-12-17 MED ORDER — THIAMINE HCL 100 MG/ML IJ SOLN: 100.0000 mg | Freq: Every day | INTRAMUSCULAR | Status: DC

## 2018-12-17 MED ORDER — LEVETIRACETAM 500 MG PO TABS
500.0000 mg | ORAL_TABLET | Freq: Two times a day (BID) | ORAL | Status: DC
  Administered 2018-12-17 – 2018-12-27 (×21): 500 mg via ORAL
  Filled 2018-12-17 (×25): qty 1

## 2018-12-17 MED ORDER — QUETIAPINE FUMARATE 50 MG PO TABS
50.0000 mg | ORAL_TABLET | Freq: Once | ORAL | Status: AC
  Administered 2018-12-17: 50 mg via ORAL
  Filled 2018-12-17 (×2): qty 1

## 2018-12-17 MED ORDER — ACETAMINOPHEN 325 MG PO TABS: 650.0000 mg | ORAL_TABLET | Freq: Four times a day (QID) | ORAL | Status: DC | PRN

## 2018-12-17 MED ORDER — ACETAMINOPHEN 325 MG PO TABS
650.0000 mg | ORAL_TABLET | ORAL | Status: DC | PRN
  Administered 2018-12-17: 650 mg via ORAL
  Filled 2018-12-17: qty 2

## 2018-12-17 MED ORDER — GABAPENTIN 400 MG PO CAPS
400.0000 mg | ORAL_CAPSULE | Freq: Three times a day (TID) | ORAL | Status: DC
  Administered 2018-12-17: 400 mg via ORAL
  Filled 2018-12-17: qty 1

## 2018-12-17 MED ORDER — MAGNESIUM HYDROXIDE 400 MG/5ML PO SUSP: 30.0000 mL | Freq: Every day | ORAL | Status: DC | PRN

## 2018-12-17 MED ORDER — TRAZODONE HCL 50 MG PO TABS: 50.0000 mg | ORAL_TABLET | Freq: Every evening | ORAL | Status: DC | PRN

## 2018-12-17 MED ORDER — HYDROXYZINE HCL 25 MG PO TABS
25.0000 mg | ORAL_TABLET | Freq: Three times a day (TID) | ORAL | Status: DC | PRN
  Administered 2018-12-17 – 2018-12-23 (×8): 25 mg via ORAL
  Filled 2018-12-17 (×3): qty 1
  Filled 2018-12-17: qty 4
  Filled 2018-12-17 (×4): qty 1

## 2018-12-17 MED ORDER — CHLORDIAZEPOXIDE HCL 25 MG PO CAPS
25.0000 mg | ORAL_CAPSULE | Freq: Four times a day (QID) | ORAL | Status: DC | PRN
  Administered 2018-12-17 – 2018-12-23 (×6): 25 mg via ORAL
  Filled 2018-12-17 (×5): qty 1

## 2018-12-17 MED ORDER — PANTOPRAZOLE SODIUM 40 MG PO TBEC
40.0000 mg | DELAYED_RELEASE_TABLET | Freq: Every day | ORAL | Status: DC
  Administered 2018-12-17: 40 mg via ORAL
  Filled 2018-12-17: qty 1

## 2018-12-17 MED ORDER — ACETAMINOPHEN 325 MG PO TABS
650.0000 mg | ORAL_TABLET | Freq: Four times a day (QID) | ORAL | Status: DC | PRN
  Administered 2018-12-17 – 2018-12-19 (×3): 650 mg via ORAL
  Filled 2018-12-17 (×3): qty 2

## 2018-12-17 MED ORDER — NICOTINE 21 MG/24HR TD PT24
21.0000 mg | MEDICATED_PATCH | Freq: Every day | TRANSDERMAL | Status: DC
  Administered 2018-12-18 – 2018-12-27 (×10): 21 mg via TRANSDERMAL
  Filled 2018-12-17 (×12): qty 1

## 2018-12-17 MED ORDER — ACETAMINOPHEN 325 MG PO TABS: 650.0000 mg | ORAL_TABLET | ORAL | Status: DC | PRN

## 2018-12-17 MED ORDER — ONDANSETRON 4 MG PO TBDP
4.0000 mg | ORAL_TABLET | Freq: Three times a day (TID) | ORAL | Status: DC | PRN
  Administered 2018-12-18 – 2018-12-24 (×2): 4 mg via ORAL
  Filled 2018-12-17 (×2): qty 1

## 2018-12-17 MED ORDER — RISPERIDONE 0.5 MG PO TABS: 0.5000 mg | ORAL_TABLET | Freq: Two times a day (BID) | ORAL | Status: DC | PRN

## 2018-12-17 MED ORDER — NICOTINE 21 MG/24HR TD PT24
21.0000 mg | MEDICATED_PATCH | Freq: Every day | TRANSDERMAL | Status: DC
  Administered 2018-12-17: 21 mg via TRANSDERMAL
  Filled 2018-12-17: qty 1

## 2018-12-17 MED ORDER — PANTOPRAZOLE SODIUM 40 MG PO TBEC
40.0000 mg | DELAYED_RELEASE_TABLET | Freq: Every day | ORAL | Status: DC
  Administered 2018-12-18 – 2018-12-27 (×10): 40 mg via ORAL
  Filled 2018-12-17 (×13): qty 1

## 2018-12-17 MED ORDER — DULOXETINE HCL 30 MG PO CPEP
30.0000 mg | ORAL_CAPSULE | Freq: Every day | ORAL | Status: DC
  Administered 2018-12-17 – 2018-12-19 (×3): 30 mg via ORAL
  Filled 2018-12-17 (×7): qty 1

## 2018-12-17 MED ORDER — GABAPENTIN 400 MG PO CAPS
400.0000 mg | ORAL_CAPSULE | Freq: Three times a day (TID) | ORAL | Status: DC
  Administered 2018-12-17 – 2018-12-27 (×30): 400 mg via ORAL
  Filled 2018-12-17 (×39): qty 1
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
## 2018-12-17 NOTE — Progress Notes (Addendum)
Pt awake in bed on initial contact. Presents flat with depressed mood. A & O X4 when assessed. Denies HI. Endorsed + SI "If I was to hurt myself, I will just take a bunch of pill and drink some vodka so I'll sleep and don't wake up". Pt verbally contracts for safety. Reports +AVH of "shadows standing over my bed, I hear repetitive music and voices that I can't make out". Rates her pain 8/10 (headache) Left eye is still reddened with bruise to left side of her face sustained from a fall when intoxicated; per pt. Emotional support and encouragement offered to pt. Q 15 minutes safety checks continues with assigned 1:1 staff in attendance at all time.  Pt receptive to care. Tolerated breakfast well. Remains safe on unit without self harm gestures or outburst.

## 2018-12-17 NOTE — Tx Team (Signed)
Initial Treatment Plan 12/17/2018 7:30 PM [name] PVV:[id number]    PATIENT STRESSORS: Financial difficulties Marital or family conflict Substance abuse   PATIENT STRENGTHS: Capable of independent living Manufacturing systems engineer Motivation for treatment/growth   PATIENT IDENTIFIED PROBLEMS:      " I don't want to live"    " I don't have nobody"    "I'm sad"         DISCHARGE CRITERIA:  Adequate post-discharge living arrangements Improved stabilization in mood, thinking, and/or behavior Motivation to continue treatment in a less acute level of care Reduction of life-threatening or endangering symptoms to within safe limits Verbal commitment to aftercare and medication compliance  PRELIMINARY DISCHARGE PLAN: Attend 12-step recovery group Outpatient therapy Placement in alternative living arrangements  PATIENT/FAMILY INVOLVEMENT: This treatment plan has been presented to and reviewed with the patient, [name],  .  The patient has been given the opportunity to ask questions and make suggestions.  Shela Nevin, RN 12/17/2018, 7:30 PM

## 2018-12-17 NOTE — H&P (Signed)
Psychiatric Admission Assessment Adult  Patient Identification: Emily FlakeDarlene Gradilla MRN:  [id number] Date of Evaluation:  12/17/2018 Chief Complaint:  MDD Alcohol use disorder Principal Diagnosis: <principal problem not specified> Diagnosis:  Active Problems:   MDD (major depressive disorder), recurrent severe, without psychosis (HCC)  History of Present Illness: Patient is seen and examined.  Patient is a 63 year old female with a past psychiatric history significant for alcohol dependence with withdrawal seizures, history of DTs, anxiety, depression, chronic pain who originally presented to the [facility] emergency department on 12/11/2018 after having a seizure.  The patient stated she had no recollection of the event.  She was alert and oriented, but per EMS she had a witnessed seizure at a bus stop.  The patient stated she had been treated for seizure disorders in the past as well as depression but had not had any medication for several weeks.  She was also trying to quit alcohol on her own.  Her last drink was reportedly on 12/09/2018.  She was [facility] to [facility] long medical service where she was evaluated after her seizure.  Unfortunately she was found to have a subarachnoid hemorrhage which was diagnosed on 2/22 after her head injury.  She was discharged home on 2/27, but returned to the emergency department on 2/28 because of headache and suicidal ideation.  She stated she continued to have a headache after her fall.  She told the emergency room department that she was suicidal and "I have hit rock bottom".  She was transferred to our facility for evaluation and stabilization.  The patient stated that she felt as though she had multiple losses with death of family members for several years, and her boyfriend was returning to prison.  She had been living at a long-term hotel, but had been evicted because she did not have any money.  She stated she was wanting to go to an [place]  house after discharge.  Associated Signs/Symptoms: Depression Symptoms:  depressed mood, anhedonia, insomnia, psychomotor retardation, fatigue, feelings of worthlessness/guilt, difficulty concentrating, hopelessness, suicidal thoughts without plan, anxiety, loss of energy/fatigue, disturbed sleep, (Hypo) Manic Symptoms:  Hallucinations, Impulsivity, Anxiety Symptoms:  Excessive Worry, Psychotic Symptoms:  Hallucinations: Auditory PTSD Symptoms: Negative Total Time spent with patient: 45 minutes  Past Psychiatric History: Patient has had several hospitalizations at our facility.  The last was in December 2019.  She has been treated with several medications for depression including Cymbalta.  She has a long history of alcohol dependence with hospitalizations dating back as far as 2011.  She stated that after her last hospitalization she was sober for 2 weeks after discharge.  She admitted to multiple times of being and detox as well as substance rehabilitation.  Is the patient at risk to self? Yes.    Has the patient been a risk to self in the past 6 months? Yes.    Has the patient been a risk to self within the distant past? Yes.    Is the patient a risk to others? No.  Has the patient been a risk to others in the past 6 months? No.  Has the patient been a risk to others within the distant past? No.   Prior Inpatient Therapy:   Prior Outpatient Therapy:    Alcohol Screening:   Substance Abuse History in the last 12 months:  Yes.   Consequences of Substance Abuse: Medical Consequences:  : Alcohol related withdrawal seizures, elevated liver function enzymes, cognitive problems. Family Consequences:  : Essentially  no contact with the family that she has left. Blackouts:  : She does not recall the events of this most recent seizure secondary to a blackout. DT's: : She admitted to history of withdrawal hallucinations as well as seizures. Withdrawal Symptoms:    Cramps Diaphoresis Diarrhea Headaches Nausea Tremors Vomiting Previous Psychotropic Medications: Yes  Psychological Evaluations: Yes  Past Medical History:  Past Medical History:  Diagnosis Date  . Alcohol dependence (HCC)   . Anxiety   . Chronic pain   . Depression   . Herniated cervical disc     Past Surgical History:  Procedure Laterality Date  . ABDOMINAL HYSTERECTOMY    . ANKLE SURGERY Right   . CARPAL TUNNEL RELEASE Bilateral   . CERVICAL FUSION    . CHOLECYSTECTOMY    . KNEE SURGERY     Family History: No family history on file. Family Psychiatric  History: Noncontributory Tobacco Screening:   Social History:  Social History   Substance and Sexual Activity  Alcohol Use Yes  . Alcohol/week: 3.0 standard drinks  . Types: 3 Standard drinks or equivalent per week   Comment: daily use.     Social History   Substance and Sexual Activity  Drug Use Yes  . Types: Marijuana    Additional Social History:                           Allergies:   Allergies  Allergen Reactions  . Ketorolac Hives  . Tramadol Hives   Lab Results:  Results for orders placed or performed during the hospital encounter of 12/16/18 (from the past 48 hour(s))  Comprehensive metabolic panel     Status: Abnormal   Collection Time: 12/16/18  8:18 PM  Result Value Ref Range   Sodium 137 135 - 145 mmol/L   Potassium 3.4 (L) 3.5 - 5.1 mmol/L   Chloride 101 98 - 111 mmol/L   CO2 29 22 - 32 mmol/L   Glucose, Bld 90 70 - 99 mg/dL   BUN 7 (L) 8 - 23 mg/dL   Creatinine, Ser 1.61 0.44 - 1.00 mg/dL   Calcium 9.8 8.9 - 09.6 mg/dL   Total Protein 7.1 6.5 - 8.1 g/dL   Albumin 4.2 3.5 - 5.0 g/dL   AST 46 (H) 15 - 41 U/L   ALT 45 (H) 0 - 44 U/L   Alkaline Phosphatase 57 38 - 126 U/L   Total Bilirubin 0.6 0.3 - 1.2 mg/dL   GFR calc non Af Amer >60 >60 mL/min   GFR calc Af Amer >60 >60 mL/min   Anion gap 7 5 - 15    Comment: Performed at [facility], [address], [place], [place] [address]  cbc     Status: Abnormal   Collection Time: 12/16/18  8:18 PM  Result Value Ref Range   WBC 4.4 4.0 - 10.5 K/uL   RBC 3.53 (L) 3.87 - 5.11 MIL/uL   Hemoglobin 11.9 (L) 12.0 - 15.0 g/dL   HCT 98.1 19.1 - 47.8 %   MCV 104.2 (H) 80.0 - 100.0 fL   MCH 33.7 26.0 - 34.0 pg   MCHC 32.3 30.0 - 36.0 g/dL   RDW 29.5 62.1 - 30.8 %   Platelets 190 150 - 400 K/uL   nRBC 0.0 0.0 - 0.2 %    Comment: Performed at [facility], [address] [place], [place] [address]  Ethanol  Status: None   Collection Time: 12/16/18  8:19 PM  Result Value Ref Range   Alcohol, Ethyl (B) <10 <10 mg/dL    Comment: (NOTE) Lowest detectable limit for serum alcohol is 10 mg/dL. For medical purposes only. Performed at [facility], [address] [place], [place] [address]   Salicylate level     Status: None   Collection Time: 12/16/18  8:19 PM  Result Value Ref Range   Salicylate Lvl <7.0 2.8 - 30.0 mg/dL    Comment: Performed at [facility], [address] [place], [place] [address]  Acetaminophen level     Status: Abnormal   Collection Time: 12/16/18  8:19 PM  Result Value Ref Range   Acetaminophen (Tylenol), Serum <10 (L) 10 - 30 ug/mL    Comment: (NOTE) Therapeutic concentrations vary significantly. A range of 10-30 ug/mL  may be an effective concentration for many patients. However, some  are best treated at concentrations outside of this range. Acetaminophen concentrations >150 ug/mL at 4 hours after ingestion  and >50 ug/mL at 12 hours after ingestion are often associated with  toxic reactions. Performed at [facility], [address] [place], [place] [address]   Rapid urine drug screen (hospital performed)     Status: None   Collection Time: 12/16/18 10:30 PM  Result Value Ref Range   Opiates NONE DETECTED NONE DETECTED   Cocaine NONE DETECTED NONE DETECTED   Benzodiazepines  NONE DETECTED NONE DETECTED   Amphetamines NONE DETECTED NONE DETECTED   Tetrahydrocannabinol NONE DETECTED NONE DETECTED   Barbiturates NONE DETECTED NONE DETECTED    Comment: (NOTE) DRUG SCREEN FOR MEDICAL PURPOSES ONLY.  IF CONFIRMATION IS NEEDED FOR ANY PURPOSE, NOTIFY LAB WITHIN 5 DAYS. LOWEST DETECTABLE LIMITS FOR URINE DRUG SCREEN Drug Class                     Cutoff (ng/mL) Amphetamine and metabolites    1000 Barbiturate and metabolites    200 Benzodiazepine                 200 Tricyclics and metabolites     300 Opiates and metabolites        300 Cocaine and metabolites        300 THC                            50 Performed at [facility], [address], [place], [place] [address]     Blood Alcohol level:  Lab Results  Component Value Date   [facility] <10 12/16/2018   ETH <10 12/11/2018    Metabolic Disorder Labs:  Lab Results  Component Value Date   HGBA1C  10/23/2009    5.4 (NOTE) The ADA recommends the following therapeutic goal for glycemic control related to Hgb A1c measurement: Goal of therapy: <6.5 Hgb A1c  Reference: American Diabetes Association: Clinical Practice Recommendations 2010, Diabetes Care, 2010, 33: (Suppl  1).   MPG 108 10/23/2009   No results found for: PROLACTIN Lab Results  Component Value Date   CHOL  10/23/2009    181        ATP III CLASSIFICATION:  <200     mg/dL   Desirable  696-295  mg/dL   Borderline High  >=284    mg/dL   High          TRIG 69 10/23/2009   HDL  88 10/23/2009   CHOLHDL 2.1 10/23/2009   VLDL 14 10/23/2009   LDLCALC  10/23/2009    79        Total Cholesterol/HDL:CHD Risk Coronary Heart Disease Risk Table                     Men   Women  1/2 Average Risk   3.4   3.3  Average Risk       5.0   4.4  2 X Average Risk   9.6   7.1  3 X Average Risk  23.4   11.0        Use the calculated Patient Ratio above and the CHD Risk Table to determine the patient's CHD Risk.        ATP III  CLASSIFICATION (LDL):  <100     mg/dL   Optimal  960-454  mg/dL   Near or Above                    Optimal  130-159  mg/dL   Borderline  098-119  mg/dL   High  >147     mg/dL   Very High    Current Medications: Current Facility-Administered Medications  Medication Dose Route Frequency Provider Last Rate Last Dose  . acetaminophen (TYLENOL) tablet 650 mg  650 mg Oral Q6H PRN Antonieta Pert, MD   650 mg at 12/17/18 1512  . alum & mag hydroxide-simeth (MAALOX/MYLANTA) 200-200-20 MG/5ML suspension 30 mL  30 mL Oral Q4H PRN Laveda Abbe, NP      . chlordiazePOXIDE (LIBRIUM) capsule 25 mg  25 mg Oral QID PRN Antonieta Pert, MD      . DULoxetine (CYMBALTA) DR capsule 30 mg  30 mg Oral Daily Antonieta Pert, MD      . gabapentin (NEURONTIN) capsule 400 mg  400 mg Oral TID Laveda Abbe, NP      . hydrOXYzine (ATARAX/VISTARIL) tablet 25 mg  25 mg Oral TID PRN Laveda Abbe, NP      . levETIRAcetam (KEPPRA) tablet 500 mg  500 mg Oral BID Laveda Abbe, NP      . magnesium hydroxide (MILK OF MAGNESIA) suspension 30 mL  30 mL Oral Daily PRN Laveda Abbe, NP      . Melene Muller ON 12/18/2018] nicotine (NICODERM CQ - dosed in mg/24 hours) patch 21 mg  21 mg Transdermal Daily Laveda Abbe, NP      . ondansetron (ZOFRAN-ODT) disintegrating tablet 4 mg  4 mg Oral Q8H PRN Laveda Abbe, NP      . Melene Muller ON 12/18/2018] pantoprazole (PROTONIX) EC tablet 40 mg  40 mg Oral Daily Laveda Abbe, NP      . risperiDONE (RISPERDAL) tablet 0.5 mg  0.5 mg Oral BID PRN Antonieta Pert, MD      . Melene Muller ON 12/18/2018] thiamine (VITAMIN B-1) tablet 100 mg  100 mg Oral Daily Laveda Abbe, NP       Or  . Melene Muller ON 12/18/2018] thiamine (B-1) injection 100 mg  100 mg Intravenous Daily Laveda Abbe, NP      . traZODone (DESYREL) tablet 50 mg  50 mg Oral QHS PRN Laveda Abbe, NP       PTA Medications: Medications Prior to Admission   Medication Sig Dispense Refill Last Dose  . DULoxetine (CYMBALTA) 30 MG capsule Take 1 capsule (30 mg total) by mouth daily. For depression (  Patient not taking: Reported on 12/10/2018) 30 capsule 0 Not Taking at Unknown time  . folic acid (FOLVITE) 1 MG tablet Take 1 tablet (1 mg total) by mouth daily for 30 days. 30 tablet 0   . gabapentin (NEURONTIN) 400 MG capsule Take 1 capsule (400 mg total) by mouth 3 (three) times daily. For agitation (Patient not taking: Reported on 12/10/2018) 90 capsule 0 Not Taking at Unknown time  . hydrOXYzine (ATARAX/VISTARIL) 25 MG tablet Take 1 tablet (25 mg total) by mouth every 6 (six) hours as needed. For anxiety (Patient not taking: Reported on 12/10/2018) 60 tablet 0 Not Taking at Unknown time  . levETIRAcetam (KEPPRA) 500 MG tablet Take 1 tablet (500 mg total) by mouth 2 (two) times daily for 30 days. 60 tablet 0 12/15/2018  . Multiple Vitamin (MULTIVITAMIN WITH MINERALS) TABS tablet Take 1 tablet by mouth daily for 30 days. 30 tablet 0 unknown  . nicotine (NICODERM CQ - DOSED IN MG/24 HOURS) 21 mg/24hr patch Place 1 patch (21 mg total) onto the skin daily. (May buy from over the counter): Smoking cessation (Patient not taking: Reported on 12/10/2018) 1 patch 0 Not Taking at Unknown time  . ondansetron (ZOFRAN ODT) 4 MG disintegrating tablet Take 1 tablet (4 mg total) by mouth every 8 (eight) hours as needed for nausea or vomiting. 15 tablet 0 Past Week at Unknown time  . pantoprazole (PROTONIX) 20 MG tablet Take 1 tablet (20 mg total) by mouth daily. For acid reflux (Patient not taking: Reported on 12/10/2018) 30 tablet 1 Not Taking at Unknown time  . thiamine 100 MG tablet Take 1 tablet (100 mg total) by mouth daily for 30 days. 30 tablet 0     Musculoskeletal: Strength & Muscle Tone: within normal limits Gait & Station: normal Patient leans: N/A  Psychiatric Specialty Exam: Physical Exam  Nursing note and vitals reviewed. Constitutional: She is oriented to  person, place, and time. She appears well-developed and well-nourished.  HENT:  Head: Normocephalic.  Respiratory: Effort normal.  Neurological: She is alert and oriented to person, place, and time.    ROS  There were no vitals taken for this visit.There is no height or weight on file to calculate BMI.  General Appearance: Disheveled  Eye Contact:  Fair  Speech:  Normal Rate  Volume:  Decreased  Mood:  Depressed  Affect:  Congruent  Thought Process:  Coherent and Descriptions of Associations: Intact  Orientation:  Full (Time, Place, and Person)  Thought Content:  Logical  Suicidal Thoughts:  Yes.  without intent/plan  Homicidal Thoughts:  No  Memory:  Immediate;   Fair Recent;   Fair Remote;   Fair  Judgement:  Impaired  Insight:  Fair  Psychomotor Activity:  Decreased and Psychomotor Retardation  Concentration:  Concentration: Fair and Attention Span: Fair  Recall:  [employer] of Knowledge:  Fair  Language:  Fair  Akathisia:  Negative  Handed:  Right  AIMS (if indicated):     Assets:  Desire for Improvement Resilience  ADL's:  Intact  Cognition:  WNL  Sleep:       Treatment Plan Summary: Daily contact with patient to assess and evaluate symptoms and progress in treatment, Medication management and Plan : Patient is seen and examined.  Patient is a 63 year old female with the above-stated past psychiatric history who presented to the [facility] long emergency department with suicidal ideation.  She will be admitted to the psychiatric unit.  She will be integrated into  the milieu.  She will be encouraged to attend groups.  Her Cymbalta will be continued, and should be given Librium 25 mg p.o. 4 times daily as needed withdrawal symptoms with a CIWA greater than 10.  She will also be continued on gabapentin 400 mg p.o. 3 times daily and her seizure medications specifically Keppra 500 mg p.o. twice daily.  She also complained of some auditory hallucinations while she was in the  medical side of the hospital, and she will have available Risperdal 0.5 mg p.o. twice daily as needed psychosis.  She will also receive thiamine and folic acid during the course of the hospitalization.  She will also have available hydroxyzine for anxiety and trazodone.  Repeat CT scan of her head on 2/28 revealed a small subdural hematoma.  Her drug screen was negative, and her blood alcohol was less than 10.  Her CBC showed a mild anemia with an increased MCV of 104.2.  Her liver function enzymes were elevated with an AST of 46 and an ALT of 45.  Observation Level/Precautions:  Detox 15 minute checks Seizure  Laboratory:  Chemistry Profile  Psychotherapy:    Medications:    Consultations:    Discharge Concerns:    Estimated LOS:  Other:     Physician Treatment Plan for Primary Diagnosis: <principal problem not specified> Long Term Goal(s): Improvement in symptoms so as ready for discharge  Short Term Goals: Ability to identify changes in lifestyle to reduce recurrence of condition will improve, Ability to verbalize feelings will improve, Ability to disclose and discuss suicidal ideas, Ability to demonstrate self-control will improve, Ability to identify and develop effective coping behaviors will improve, Ability to maintain clinical measurements within normal limits will improve and Ability to identify triggers associated with substance abuse/mental health issues will improve  Physician Treatment Plan for Secondary Diagnosis: Active Problems:   MDD (major depressive disorder), recurrent severe, without psychosis (HCC)  Long Term Goal(s): Improvement in symptoms so as ready for discharge  Short Term Goals: Ability to identify changes in lifestyle to reduce recurrence of condition will improve, Ability to verbalize feelings will improve, Ability to disclose and discuss suicidal ideas, Ability to demonstrate self-control will improve, Ability to identify and develop effective coping behaviors  will improve, Ability to maintain clinical measurements within normal limits will improve and Ability to identify triggers associated with substance abuse/mental health issues will improve  I certify that inpatient services furnished can reasonably be expected to improve the patient's condition.    Antonieta Pert, MD 2/29/20203:18 PM

## 2018-12-17 NOTE — [facility] Suicide Risk Assessment (Signed)
[facility] Admission Suicide Risk Assessment   Nursing information obtained from:    Demographic factors:    Current Mental Status:    Loss Factors:    Historical Factors:    Risk Reduction Factors:     Total Time spent with patient: 45 minutes Principal Problem: <principal problem not specified> Diagnosis:  Active Problems:   MDD (major depressive disorder), recurrent severe, without psychosis (HCC)  Subjective Data: Patient is seen and examined.  Patient is a 63 year old female with a past psychiatric history significant for alcohol dependence with withdrawal seizures, history of DTs, anxiety, depression, chronic pain who originally presented to the [facility] emergency department on 12/11/2018 after having a seizure.  The patient stated she had no recollection of the event.  She was alert and oriented, but per EMS she had a witnessed seizure at a bus stop.  The patient stated she had been treated for seizure disorders in the past as well as depression but had not had any medication for several weeks.  She was also trying to quit alcohol on her own.  Her last drink was reportedly on 12/09/2018.  She was admitted to the [facility] long medical service where she was evaluated after her seizure.  Unfortunately she was found to have a subarachnoid hemorrhage which was diagnosed on 2/22 after her head injury.  She was discharged home on 2/27, but returned to the emergency department on 2/28 because of headache and suicidal ideation.  She stated she continued to have a headache after her fall.  She told the emergency room department that she was suicidal and "I have hit rock bottom".  She was transferred to our facility for evaluation and stabilization.  The patient stated that she felt as though she had multiple losses with death of family members for several years, and her boyfriend was returning to prison.  She had been living at a long-term hotel, but had been evicted because she did not have  any money.  She stated she was wanting to go to an [place] house after discharge.  Continued Clinical Symptoms:    The "Alcohol Use Disorders Identification Test", Guidelines for Use in Primary Care, Second Edition.  World Science writer [facility]). Score between 0-7:  no or low risk or alcohol related problems. Score between 8-15:  moderate risk of alcohol related problems. Score between 16-19:  high risk of alcohol related problems. Score 20 or above:  warrants further diagnostic evaluation for alcohol dependence and treatment.   CLINICAL FACTORS:   Depression:   Anhedonia Comorbid alcohol abuse/dependence Hopelessness Impulsivity Insomnia Alcohol/Substance Abuse/Dependencies   Musculoskeletal: Strength & Muscle Tone: within normal limits Gait & Station: normal Patient leans: N/A  Psychiatric Specialty Exam: Physical Exam  Nursing note and vitals reviewed. Constitutional: She is oriented to person, place, and time. She appears well-developed and well-nourished.  HENT:  Head: Normocephalic.  Respiratory: Effort normal.  Neurological: She is alert and oriented to person, place, and time.    ROS  There were no vitals taken for this visit.There is no height or weight on file to calculate BMI.  General Appearance: Disheveled  Eye Contact:  Fair  Speech:  Normal Rate  Volume:  Decreased  Mood:  Depressed  Affect:  Congruent  Thought Process:  Coherent and Descriptions of Associations: Intact  Orientation:  Full (Time, Place, and Person)  Thought Content:  Logical  Suicidal Thoughts:  Yes.  without intent/plan  Homicidal Thoughts:  No  Memory:  Immediate;  Poor Recent;   Poor Remote;   Poor  Judgement:  Intact  Insight:  Fair  Psychomotor Activity:  Decreased and Psychomotor Retardation  Concentration:  Concentration: Fair and Attention Span: Fair  Recall:  [employer] of Knowledge:  Fair  Language:  Fair  Akathisia:  Negative  Handed:  Right  AIMS (if indicated):      Assets:  Desire for Improvement Leisure Time Resilience  ADL's:  Intact  Cognition:  WNL  Sleep:         COGNITIVE FEATURES THAT CONTRIBUTE TO RISK:  None    SUICIDE RISK:   Minimal: No identifiable suicidal ideation.  Patients presenting with no risk factors but with morbid ruminations; may be classified as minimal risk based on the severity of the depressive symptoms  PLAN OF CARE: Patient is seen and examined.  Patient is a 63 year old female with the above-stated past psychiatric history who presented to the [facility] long emergency department with suicidal ideation.  She will be admitted to the psychiatric unit.  She will be integrated into the milieu.  She will be encouraged to attend groups.  Her Cymbalta will be continued, and should be given Librium 25 mg p.o. 4 times daily as needed withdrawal symptoms with a CIWA greater than 10.  She will also be continued on gabapentin 400 mg p.o. 3 times daily and her seizure medications specifically Keppra 500 mg p.o. twice daily.  She also complained of some auditory hallucinations while she was in the medical side of the hospital, and she will have available Risperdal 0.5 mg p.o. twice daily as needed psychosis.  She will also receive thiamine and folic acid during the course of the hospitalization.  She will also have available hydroxyzine for anxiety and trazodone.  Repeat CT scan of her head on 2/28 revealed a small subdural hematoma.  Her drug screen was negative, and her blood alcohol was less than 10.  Her CBC showed a mild anemia with an increased MCV of 104.2.  Her liver function enzymes were elevated with an AST of 46 and an ALT of 45.  I certify that inpatient services furnished can reasonably be expected to improve the patient's condition.   Antonieta Pert, MD 12/17/2018, 3:07 PM

## 2018-12-17 NOTE — Progress Notes (Signed)
Adult Psychoeducational Group Note  Date:  12/17/2018 Time:  9:39 PM  Group Topic/Focus:  Wrap-Up Group:   The focus of this group is to help patients review their daily goal of treatment and discuss progress on daily workbooks.  Participation Level:  Active  Participation Quality:  Appropriate  Affect:  Appropriate  Cognitive:  Appropriate  Insight: Appropriate  Engagement in Group:  Engaged  Modes of Intervention:  Discussion  Additional Comments:  Pt stated her goal was to get admitted and speak with the social worker about sobering up and the psychiatrist about getting back on her meds.  Pt rated the day at a 2/10 because she is feeling depressed.  Kristine Linea 12/17/2018, 9:39 PM
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
## 2018-12-17 NOTE — Progress Notes (Addendum)
Patient is a 63 year old female who had recently been discharged from [facility], presented to [facility]  intoxicated and making suicidal threats- Pt has a hx of chronic depression, alcohol abuse and withdrawal seizures -reports drinking a "1/5 a day"- reports that her last drink was 12/09/18. Pt presents today with a depressed effect/mood, voicing hopelessness, "I don't want to live". Pt reports, if she were out of here she would "overdose on pills and vodka". Pt was calm and cooperative, answering questions appropriately  throughout the admission interview. Patient endorses passive SI- verbally contracting for safety. VS taken- Skin assessment revealed facial bruising and extensive bruising on left thigh (from a recent fall after having a seizure). Admission paperwork completed and signed. Verbal understanding expressed..Fall precautions initiated for safety.

## 2018-12-17 NOTE — [facility] Suicide Risk Assessment (Signed)
[facility] INPATIENT:  Family/Significant Other Suicide Prevention Education  Suicide Prevention Education:  Patient Refusal for Family/Significant Other Suicide Prevention Education: The patient [name] has refused to provide written consent for family/significant other to be provided Family/Significant Other Suicide Prevention Education during admission and/or prior to discharge.  Physician notified.  Patient did not actually refuse, but stated there is nobody to contact.  Her daughter is currently hospitalized at [facility].  Suicide Prevention Education was reviewed thoroughly with patient, including risk factors, warning signs, and what to do.  Mobile Crisis services were described and that telephone number pointed out, with encouragement to patient to put this number in personal cell phone.  Brochure was provided to patient to share with natural supports.  Patient acknowledged the ways in which they are at risk, and how working through each of their issues can gradually start to reduce their risk factors.  Patient was encouraged to think of the information in the context of people in their own lives.  Patient denied having access to firearms  Patient verbalized understanding of information provided.  Patient endorsed a desire to live.      Carloyn Jaeger Grossman-Orr 12/17/2018, 4:49 PM

## 2018-12-17 NOTE — Progress Notes (Signed)
D: Pt denies SI/HI/AVH. Pt stated she was going through withdrawals. Pt stated she had PRN Librium, but pt was informed that her Tox screen and ETOH level was -ve.  A: Pt was offered support and encouragement. Pt was given scheduled medications. Pt was encourage to attend groups. Q 15 minute checks were done for safety.   R: safety maintained on unit.

## 2018-12-18 DIAGNOSIS — F332 Major depressive disorder, 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 psychotic features: Principal | ICD-10-CM

## 2018-12-18 MED ORDER — QUETIAPINE FUMARATE 50 MG PO TABS
50.0000 mg | ORAL_TABLET | Freq: Once | ORAL | Status: AC
  Administered 2018-12-18: 50 mg via ORAL
  Filled 2018-12-18 (×2): qty 1

## 2018-12-18 NOTE — Progress Notes (Signed)
D Pt is observed OOB UAL on the 300 hall today. She endorses a quiet, flat affect and sad demeanor. She wears hospital-issued red patient scrubs. SHe walks up to the med window- approaching this Clinical research associate cautiously and slowly. She avoids making initial eye contact with this Clinical research associate and speaks almost inaudibly.     A SHe completed her daily assessment and on this she wrote she denied SI today and she rated her depressionk, hopelessness and anxiety " 07/28/09", respectively. SHe takes all her scheudled meds as planned, she was given a prn dose of tylenol ( for c/o headache on the right side - from the fall she sustained prior to her admission here to this faicility. She shares that she is deling with feelings of sadness, grief and loneliness related to the recent death of both her mother and her brother. She speaks of how alone she feels.     R Emotional support offered by this Clinical research associate. Pt allowed to share feelings in space. Safeyt maintained.

## 2018-12-18 NOTE — Progress Notes (Signed)
Patient attended AA group meeting.  

## 2018-12-18 NOTE — Progress Notes (Signed)
D: Pt denies SI/HI/AVH. Pt is pleasant and cooperative. Pt stated she had a good day and was feeling better, trying to go to groups and taking one day at a time. "making progress A: Pt was offered support and encouragement. Pt was given scheduled medications. Pt was encourage to attend groups. Q 15 minute checks were done for safety.  R:Pt attends groups and interacts well with peers and staff. Pt is taking medication. Pt has no complaints.Pt receptive to treatment and safety maintained on unit.  Problem: Education: Goal: Emotional status will improve Outcome: Progressing   Problem: Education: Goal: Mental status will improve Outcome: Progressing   Problem: Activity: Goal: Interest or engagement in activities will improve Outcome: Progressing

## 2018-12-18 NOTE — Plan of Care (Signed)
  Problem: Education: Goal: Knowledge of [facility] Education information/materials will improve Outcome: Progressing Goal: Emotional status will improve Outcome: Progressing Goal: Mental status will improve Outcome: Progressing   

## 2018-12-18 NOTE — [facility] Group Notes (Signed)
[facility] LCSW Group Therapy Note  12/18/2018   10:00-11:00AM  Type of Therapy and Topic:  Group Therapy:  Unhealthy versus Healthy Supports, Which Am I?  Participation Level:  Active   Description of Group:  Patients in this group were introduced to the concept that additional supports including self-support are an essential part of recovery.  Initially a discussion was held about the differences between healthy versus unhealthy supports.  Patients were asked to share what unhealthy supports in their lives need to be addressed, as well as what additional healthy supports could be added for greater help in reaching their goals.   A song entitled "My Own Hero" was played and a group discussion ensued in which patients stated they could relate to the song and it inspired them to realize they have be willing to help themselves in order to succeed, because other people cannot achieve sobriety or stability for them.  We discussed adding a variety of healthy supports to address the various needs in patient lives, including becoming more self-supportive.  Therapeutic Goals: 1)  Highlight the differences between healthy and unhealthy supports 2)  Suggest the importance of being a part of one's own support system 2)  Discuss reasons people in one's life may eventually be unable to be continually supportive  3)  Identify the patient's current support system and   4)  elicit commitments to add healthy supports and to become more conscious of being self-supportive   Summary of Patient Progress:  The patient expressed that the unhealthy support which needs to be addressed includes her own self-support, adding that all her family supports are dead.  Healthy supports which could be added for increased stability and happiness include staying at an [facility] (list was given to her this morning), going to Alcoholics Anonymous, getting mental health treatment from a psychiatrist and therapist, and staying on  medicines.  Therapeutic Modalities:   Motivational Interviewing Activity  Lynnell Chad

## 2018-12-18 NOTE — [facility] Counselor (Signed)
Clinical Social Work Note  Pt was given a printed copy of [employer] in [place] and was asked to start making phone calls to secure housing for when she is discharged.  Ambrose Mantle, LCSW 12/18/2018, 9:38 AM

## 2018-12-18 NOTE — Progress Notes (Signed)
[facility] MD Progress Note  12/18/2018 9:39 AM [name]  MRN:  [id number]   Subjective:  [name] reports " I am depressed and I needed to get help."  Evaluation: [name] seen resting in dayroom. Patient present flat, guarded and depressed. Reports a history of seizure and alcohol abuse. States she feels motivated to " get clean" states she is hopeful to get into a [facility] after discharge. Reports she was recently evicted from a motel [address]. Rates her depression 8/10 with 10 being the worse. Denies suicidal or homicidal ideations. Denies auidtory or visual hallunication. Reports a poor appetite. States she is not resting well throughout the night. Patient denies any withdrawal symptoms or alcohol cravings. Support, encouragement and reassurance was provided.   History: per assessment note- Patient is a 63 year old female with a past psychiatric history significant for alcohol dependence with withdrawal seizures, history of DTs, anxiety, depression, chronic pain who originally presented to the [facility] emergency department on 12/11/2018 after having a seizure. The patient stated she had no recollection of the event. She was alert and oriented, but per EMS she had a witnessed seizure at a bus stop. The patient stated she had been treated for seizure disorders in the past as well as depression but had not had any medication for several weekse.  Principal Problem: MDD (major depressive disorder), recurrent severe, without psychosis (HCC) Diagnosis: Principal Problem:   MDD (major depressive disorder), recurrent severe, without psychosis (HCC)  Total Time spent with patient: 15 minutes  Past Psychiatric History:   Past Medical History:  Past Medical History:  Diagnosis Date  . Alcohol dependence (HCC)   . Anxiety   . Chronic pain   . Depression   . Herniated cervical disc     Past Surgical History:  Procedure Laterality Date  . ABDOMINAL HYSTERECTOMY    . ANKLE SURGERY  Right   . CARPAL TUNNEL RELEASE Bilateral   . CERVICAL FUSION    . CHOLECYSTECTOMY    . KNEE SURGERY     Family History: History reviewed. No pertinent family history. Family Psychiatric  History:  Social History:  Social History   Substance and Sexual Activity  Alcohol Use Yes  . Alcohol/week: 3.0 standard drinks  . Types: 3 Standard drinks or equivalent per week   Comment: daily use.     Social History   Substance and Sexual Activity  Drug Use Yes  . Types: Marijuana    Social History   Socioeconomic History  . Marital status: Single    Spouse name: Not on file  . Number of children: Not on file  . Years of education: Not on file  . Highest education level: Not on file  Occupational History  . Not on file  Social Needs  . Financial resource strain: Somewhat hard  . Food insecurity:    Worry: Sometimes true    Inability: Sometimes true  . Transportation needs:    Medical: No    Non-medical: No  Tobacco Use  . Smoking status: Current Every Day Smoker    Packs/day: 0.50    Years: 7.00    Pack years: 3.50    Types: Cigarettes  . Smokeless tobacco: Never Used  Substance and Sexual Activity  . Alcohol use: Yes    Alcohol/week: 3.0 standard drinks    Types: 3 Standard drinks or equivalent per week    Comment: daily use.  . Drug use: Yes    Types: Marijuana  . Sexual activity: Not  Currently  Lifestyle  . Physical activity:    Days per week: Patient refused    Minutes per session: Patient refused  . Stress: Only a little  Relationships  . Social connections:    Talks on phone: Patient refused    Gets together: Patient refused    Attends religious service: Patient refused    Active member of club or organization: Patient refused    Attends meetings of clubs or organizations: Patient refused    Relationship status: Patient refused  Other Topics Concern  . Not on file  Social History Narrative   Pt lives in a [employer]; lives with boyfriend.   Additional  Social History:                         Sleep: Fair  Appetite:  Fair  Current Medications: Current Facility-Administered Medications  Medication Dose Route Frequency Provider Last Rate Last Dose  . acetaminophen (TYLENOL) tablet 650 mg  650 mg Oral Q6H PRN Antonieta Pert, MD   650 mg at 12/18/18 0752  . alum & mag hydroxide-simeth (MAALOX/MYLANTA) 200-200-20 MG/5ML suspension 30 mL  30 mL Oral Q4H PRN Laveda Abbe, NP      . chlordiazePOXIDE (LIBRIUM) capsule 25 mg  25 mg Oral QID PRN Antonieta Pert, MD   25 mg at 12/18/18 0752  . DULoxetine (CYMBALTA) DR capsule 30 mg  30 mg Oral Daily Antonieta Pert, MD   30 mg at 12/18/18 9476  . gabapentin (NEURONTIN) capsule 400 mg  400 mg Oral TID Laveda Abbe, NP   400 mg at 12/18/18 0747  . hydrOXYzine (ATARAX/VISTARIL) tablet 25 mg  25 mg Oral TID PRN Laveda Abbe, NP   25 mg at 12/17/18 2152  . levETIRAcetam (KEPPRA) tablet 500 mg  500 mg Oral BID Laveda Abbe, NP   500 mg at 12/18/18 0747  . magnesium hydroxide (MILK OF MAGNESIA) suspension 30 mL  30 mL Oral Daily PRN Laveda Abbe, NP      . nicotine (NICODERM CQ - dosed in mg/24 hours) patch 21 mg  21 mg Transdermal Daily Laveda Abbe, NP   21 mg at 12/18/18 0746  . ondansetron (ZOFRAN-ODT) disintegrating tablet 4 mg  4 mg Oral Q8H PRN Laveda Abbe, NP   4 mg at 12/18/18 5465  . pantoprazole (PROTONIX) EC tablet 40 mg  40 mg Oral Daily Laveda Abbe, NP   40 mg at 12/18/18 0748  . risperiDONE (RISPERDAL) tablet 0.5 mg  0.5 mg Oral BID PRN Antonieta Pert, MD      . thiamine (VITAMIN B-1) tablet 100 mg  100 mg Oral Daily Laveda Abbe, NP   100 mg at 12/18/18 0354   Or  . thiamine (B-1) injection 100 mg  100 mg Intravenous Daily Laveda Abbe, NP        Lab Results:  Results for orders placed or performed during the hospital encounter of 12/16/18 (from the past 48 hour(s))   Comprehensive metabolic panel     Status: Abnormal   Collection Time: 12/16/18  8:18 PM  Result Value Ref Range   Sodium 137 135 - 145 mmol/L   Potassium 3.4 (L) 3.5 - 5.1 mmol/L   Chloride 101 98 - 111 mmol/L   CO2 29 22 - 32 mmol/L   Glucose, Bld 90 70 - 99 mg/dL   BUN 7 (L) 8 - 23 mg/dL  Creatinine, Ser 0.67 0.44 - 1.00 mg/dL   Calcium 9.8 8.9 - 47.4 mg/dL   Total Protein 7.1 6.5 - 8.1 g/dL   Albumin 4.2 3.5 - 5.0 g/dL   AST 46 (H) 15 - 41 U/L   ALT 45 (H) 0 - 44 U/L   Alkaline Phosphatase 57 38 - 126 U/L   Total Bilirubin 0.6 0.3 - 1.2 mg/dL   GFR calc non Af Amer >60 >60 mL/min   GFR calc Af Amer >60 >60 mL/min   Anion gap 7 5 - 15    Comment: Performed at [facility], [address] [place], [place] [address]  cbc     Status: Abnormal   Collection Time: 12/16/18  8:18 PM  Result Value Ref Range   WBC 4.4 4.0 - 10.5 K/uL   RBC 3.53 (L) 3.87 - 5.11 MIL/uL   Hemoglobin 11.9 (L) 12.0 - 15.0 g/dL   HCT 38.7 56.4 - 33.2 %   MCV 104.2 (H) 80.0 - 100.0 fL   MCH 33.7 26.0 - 34.0 pg   MCHC 32.3 30.0 - 36.0 g/dL   RDW 95.1 88.4 - 16.6 %   Platelets 190 150 - 400 K/uL   nRBC 0.0 0.0 - 0.2 %    Comment: Performed at [facility], [address] [place], [place] [address]  Ethanol     Status: None   Collection Time: 12/16/18  8:19 PM  Result Value Ref Range   Alcohol, Ethyl (B) <10 <10 mg/dL    Comment: (NOTE) Lowest detectable limit for serum alcohol is 10 mg/dL. For medical purposes only. Performed at [facility], [address] [place], [place] [address]   Salicylate level     Status: None   Collection Time: 12/16/18  8:19 PM  Result Value Ref Range   Salicylate Lvl <7.0 2.8 - 30.0 mg/dL    Comment: Performed at [facility], [address] [place], [place] [address]  Acetaminophen level     Status: Abnormal   Collection Time: 12/16/18  8:19 PM  Result Value Ref Range   Acetaminophen  (Tylenol), Serum <10 (L) 10 - 30 ug/mL    Comment: (NOTE) Therapeutic concentrations vary significantly. A range of 10-30 ug/mL  may be an effective concentration for many patients. However, some  are best treated at concentrations outside of this range. Acetaminophen concentrations >150 ug/mL at 4 hours after ingestion  and >50 ug/mL at 12 hours after ingestion are often associated with  toxic reactions. Performed at [facility], [address] [place], [place] [address]   Rapid urine drug screen (hospital performed)     Status: None   Collection Time: 12/16/18 10:30 PM  Result Value Ref Range   Opiates NONE DETECTED NONE DETECTED   Cocaine NONE DETECTED NONE DETECTED   Benzodiazepines NONE DETECTED NONE DETECTED   Amphetamines NONE DETECTED NONE DETECTED   Tetrahydrocannabinol NONE DETECTED NONE DETECTED   Barbiturates NONE DETECTED NONE DETECTED    Comment: (NOTE) DRUG SCREEN FOR MEDICAL PURPOSES ONLY.  IF CONFIRMATION IS NEEDED FOR ANY PURPOSE, NOTIFY LAB WITHIN 5 DAYS. LOWEST DETECTABLE LIMITS FOR URINE DRUG SCREEN Drug Class                     Cutoff (ng/mL) Amphetamine and metabolites    1000 Barbiturate and metabolites    200 Benzodiazepine  200 Tricyclics and metabolites     300 Opiates and metabolites        300 Cocaine and metabolites        300 THC                            50 Performed at [facility], [address] [place], [place] [address]     Blood Alcohol level:  Lab Results  Component Value Date   [facility] <10 12/16/2018   ETH <10 12/11/2018    Metabolic Disorder Labs: Lab Results  Component Value Date   HGBA1C  10/23/2009    5.4 (NOTE) The ADA recommends the following therapeutic goal for glycemic control related to Hgb A1c measurement: Goal of therapy: <6.5 Hgb A1c  Reference: American Diabetes Association: Clinical Practice Recommendations 2010, Diabetes Care, 2010, 33: (Suppl  1).   MPG  108 10/23/2009   No results found for: PROLACTIN Lab Results  Component Value Date   CHOL  10/23/2009    181        ATP III CLASSIFICATION:  <200     mg/dL   Desirable  604-540  mg/dL   Borderline High  >=981    mg/dL   High          TRIG 69 10/23/2009   HDL 88 10/23/2009   CHOLHDL 2.1 10/23/2009   VLDL 14 10/23/2009   LDLCALC  10/23/2009    79        Total Cholesterol/HDL:CHD Risk Coronary Heart Disease Risk Table                     Men   Women  1/2 Average Risk   3.4   3.3  Average Risk       5.0   4.4  2 X Average Risk   9.6   7.1  3 X Average Risk  23.4   11.0        Use the calculated Patient Ratio above and the CHD Risk Table to determine the patient's CHD Risk.        ATP III CLASSIFICATION (LDL):  <100     mg/dL   Optimal  191-478  mg/dL   Near or Above                    Optimal  130-159  mg/dL   Borderline  295-621  mg/dL   High  >308     mg/dL   Very High    Physical Findings: AIMS:  , ,  ,  ,    CIWA:    COWS:     Musculoskeletal: Strength & Muscle Tone: within normal limits Gait & Station: normal Patient leans: N/A  Psychiatric Specialty Exam: Physical Exam  Vitals reviewed. Constitutional: She appears well-developed.  Cardiovascular: Normal rate.  Neurological: She is alert.  Psychiatric: She has a normal mood and affect. Her behavior is normal.    Review of Systems  Psychiatric/Behavioral: Positive for depression. Negative for suicidal ideas. The patient is nervous/anxious.   All other systems reviewed and are negative.   Blood pressure 108/85, pulse 79, temperature 98.6 F (37 C), temperature source Oral, resp. rate 20, height  (1.549 m), weight 59.4 kg.Body mass index is 24.75 kg/m.  General Appearance: Casual  Eye Contact:  Fair  Speech:  Clear and Coherent  Volume:  Normal  Mood:  Anxious and Depressed  Affect:  Congruent  Thought Process:  Coherent  Orientation:  Full (Time, Place, and Person)  Thought Content:   Logical  Suicidal Thoughts:  No  Homicidal Thoughts:  No  Memory:  Immediate;   Fair Recent;   Fair Remote;   Fair  Judgement:  Fair  Insight:  Fair  Psychomotor Activity:  Normal  Concentration:  Concentration: Fair  Recall:  [employer]Fair  Fund of Knowledge:  Fair  Language:  Fair  Akathisia:  No  Handed:  Right  AIMS (if indicated):     Assets:  Communication Skills Desire for Improvement Resilience Social Support  ADL's:  Intact  Cognition:  WNL  Sleep:  Number of Hours: 6.5     Treatment Plan Summary: Daily contact with patient to assess and evaluate symptoms and progress in treatment and Medication management   Continue with current treatment plan on 12/18/2018 as listed below except where noted  Major depressive disorder:   Continue Cymbalta 30 mg p.o daily  Continue Neurontin 400 mg p.o. 3 times daily  Continue Zyprexa 50 mg p.o.  Continue Resporal 0.5 mg twice daily as needed  CIWA protocol alcohol Librium as needed monitoring s    CSW to continue working on discharge disposition Patient encouraged to participate with daily milieu  Emily Rackanika N Leilanee Righetti, NP 12/18/2018, 9:39 AM

## 2018-12-18 NOTE — [facility] Group Notes (Signed)
[facility] Group Notes:  (Nursing/MHT/Case Management/Adjunct)  Date:  12/18/2018  Time:  6:10 PM  Type of Therapy:  Nurse Education  Participation Level:  Active  Participation Quality:  Appropriate and Attentive  Affect:  Appropriate  Cognitive:  Alert and Appropriate  Insight:  Good  Engagement in Group:  Engaged  Modes of Intervention:  Activity and Discussion  Summary of Progress/Problems: In this group, the RN reviewed healthy coping strategies. The patients were taught why they struggle with symptoms of depression and how to work through them. The patient was cooperative and participated.  Kirstie Mirza 12/18/2018, 6:10 PM

## 2018-12-19 MED ORDER — DULOXETINE HCL 60 MG PO CPEP
60.0000 mg | ORAL_CAPSULE | Freq: Every day | ORAL | Status: DC
  Administered 2018-12-20 – 2018-12-27 (×8): 60 mg via ORAL
  Filled 2018-12-19 (×10): qty 1

## 2018-12-19 MED ORDER — HYDROCODONE-ACETAMINOPHEN 5-325 MG PO TABS
1.0000 | ORAL_TABLET | Freq: Four times a day (QID) | ORAL | Status: DC | PRN
  Filled 2018-12-19: qty 2

## 2018-12-19 MED ORDER — RISPERIDONE 2 MG PO TABS
2.0000 mg | ORAL_TABLET | Freq: Two times a day (BID) | ORAL | Status: DC | PRN
  Administered 2018-12-19 – 2018-12-20 (×2): 2 mg via ORAL
  Filled 2018-12-19 (×2): qty 1

## 2018-12-19 MED ORDER — DULOXETINE HCL 30 MG PO CPEP
30.0000 mg | ORAL_CAPSULE | Freq: Once | ORAL | Status: AC
  Administered 2018-12-19: 30 mg via ORAL
  Filled 2018-12-19 (×2): qty 1

## 2018-12-19 MED ORDER — HYDROCODONE-ACETAMINOPHEN 5-325 MG PO TABS
2.0000 | ORAL_TABLET | Freq: Four times a day (QID) | ORAL | Status: DC | PRN
  Administered 2018-12-19 – 2018-12-21 (×7): 2 via ORAL
  Filled 2018-12-19 (×6): qty 2

## 2018-12-19 NOTE — [facility] Group Notes (Signed)
     Spiritual Care Group Therapy   Type of Therapy and Topic: Chaplain group.Chaplain engaged group in discussion regarding grief, loss, and supports.   Participation level: Active   Modes of Intervention: Discussion, Education and Socialization   Summary of Progress/Problems: Patient remained present throughout group and participated appropriately to the group discussion.  [name], LCSW-A Clinical Social Worker

## 2018-12-19 NOTE — Progress Notes (Signed)
[facility] MD Progress Note  12/19/2018 12:43 PM [name]  MRN:  [id number] Subjective: Patient is a 63 year old female with a past psychiatric history significant for alcohol dependence, major depression versus substance-induced mood disorder, recent alcohol related withdrawal seizure.  She was admitted on 12/17/2018 with suicidal ideation.  Objective: Patient is seen and examined.  Patient is a 63 year old female with the above-stated past psychiatric history who is seen in follow-up.  Her biggest complaint today is pain.  She is having trouble from the pain from the injury to her skull from the fall at the seizure.  I had previously reviewed her medications and she keeps coming up with a contraindication for any narcotics because of an allergy to tramadol, and she has a history of reflux with a self-reported history of a GI bleed from ibuprofen or other nonsteroidals.  She stated today that she could take hydrocodone, and I told her that she could have that.  I ask about hot packs as well as ice packs and she said it just makes it hurt worse.  She stated she is ready to go to rehab, and social work said this morning there were some openings at [facility].  Hopefully she will get excepted there quickly.  She is also having problems sleeping.  I asked her which medication had been more helpful the Risperdal or the Seroquel and she said the Risperdal had been.  I told her we would increase that.  She continues to complain of anxiety symptoms, and I increased her Cymbalta to 60 mg p.o. daily.  She denied any auditory or visual hallucinations since she was admitted to the hospital.  Her vital signs are stable, she is afebrile.  Principal Problem: MDD (major depressive disorder), recurrent severe, without psychosis (HCC) Diagnosis: Principal Problem:   MDD (major depressive disorder), recurrent severe, without psychosis (HCC)  Total Time spent with patient: 15 minutes  Past Psychiatric History: See admission  H&P  Past Medical History:  Past Medical History:  Diagnosis Date  . Alcohol dependence (HCC)   . Anxiety   . Chronic pain   . Depression   . Herniated cervical disc     Past Surgical History:  Procedure Laterality Date  . ABDOMINAL HYSTERECTOMY    . ANKLE SURGERY Right   . CARPAL TUNNEL RELEASE Bilateral   . CERVICAL FUSION    . CHOLECYSTECTOMY    . KNEE SURGERY     Family History: History reviewed. No pertinent family history. Family Psychiatric  History: See admission H&P Social History:  Social History   Substance and Sexual Activity  Alcohol Use Yes  . Alcohol/week: 3.0 standard drinks  . Types: 3 Standard drinks or equivalent per week   Comment: daily use.     Social History   Substance and Sexual Activity  Drug Use Yes  . Types: Marijuana    Social History   Socioeconomic History  . Marital status: Single    Spouse name: Not on file  . Number of children: Not on file  . Years of education: Not on file  . Highest education level: Not on file  Occupational History  . Not on file  Social Needs  . Financial resource strain: Somewhat hard  . Food insecurity:    Worry: Sometimes true    Inability: Sometimes true  . Transportation needs:    Medical: No    Non-medical: No  Tobacco Use  . Smoking status: Current Every Day Smoker    Packs/day: 0.50  Years: 7.00    Pack years: 3.50    Types: Cigarettes  . Smokeless tobacco: Never Used  Substance and Sexual Activity  . Alcohol use: Yes    Alcohol/week: 3.0 standard drinks    Types: 3 Standard drinks or equivalent per week    Comment: daily use.  . Drug use: Yes    Types: Marijuana  . Sexual activity: Not Currently  Lifestyle  . Physical activity:    Days per week: Patient refused    Minutes per session: Patient refused  . Stress: Only a little  Relationships  . Social connections:    Talks on phone: Patient refused    Gets together: Patient refused    Attends religious service: Patient  refused    Active member of club or organization: Patient refused    Attends meetings of clubs or organizations: Patient refused    Relationship status: Patient refused  Other Topics Concern  . Not on file  Social History Narrative   Pt lives in a [employer]; lives with boyfriend.   Additional Social History:                         Sleep: Fair  Appetite:  Fair  Current Medications: Current Facility-Administered Medications  Medication Dose Route Frequency Provider Last Rate Last Dose  . acetaminophen (TYLENOL) tablet 650 mg  650 mg Oral Q6H PRN Antonieta Pert, MD   650 mg at 12/19/18 0328  . alum & mag hydroxide-simeth (MAALOX/MYLANTA) 200-200-20 MG/5ML suspension 30 mL  30 mL Oral Q4H PRN Laveda Abbe, NP      . chlordiazePOXIDE (LIBRIUM) capsule 25 mg  25 mg Oral QID PRN Antonieta Pert, MD   25 mg at 12/19/18 0744  . DULoxetine (CYMBALTA) DR capsule 30 mg  30 mg Oral Daily Antonieta Pert, MD   30 mg at 12/19/18 0744  . gabapentin (NEURONTIN) capsule 400 mg  400 mg Oral TID Laveda Abbe, NP   400 mg at 12/19/18 1156  . hydrOXYzine (ATARAX/VISTARIL) tablet 25 mg  25 mg Oral TID PRN Laveda Abbe, NP   25 mg at 12/18/18 2145  . levETIRAcetam (KEPPRA) tablet 500 mg  500 mg Oral BID Laveda Abbe, NP   500 mg at 12/19/18 0744  . magnesium hydroxide (MILK OF MAGNESIA) suspension 30 mL  30 mL Oral Daily PRN Laveda Abbe, NP      . nicotine (NICODERM CQ - dosed in mg/24 hours) patch 21 mg  21 mg Transdermal Daily Laveda Abbe, NP   21 mg at 12/19/18 0744  . ondansetron (ZOFRAN-ODT) disintegrating tablet 4 mg  4 mg Oral Q8H PRN Laveda Abbe, NP   4 mg at 12/18/18 6384  . pantoprazole (PROTONIX) EC tablet 40 mg  40 mg Oral Daily Laveda Abbe, NP   40 mg at 12/19/18 0744  . risperiDONE (RISPERDAL) tablet 0.5 mg  0.5 mg Oral BID PRN Antonieta Pert, MD      . thiamine (VITAMIN B-1) tablet 100 mg  100 mg  Oral Daily Laveda Abbe, NP   100 mg at 12/19/18 5364   Or  . thiamine (B-1) injection 100 mg  100 mg Intravenous Daily Laveda Abbe, NP        Lab Results: No results found for this or any previous visit (from the past 48 hour(s)).  Blood Alcohol level:  Lab Results  Component Value Date  ETH <10 12/16/2018   ETH <10 12/11/2018    Metabolic Disorder Labs: Lab Results  Component Value Date   HGBA1C  10/23/2009    5.4 (NOTE) The ADA recommends the following therapeutic goal for glycemic control related to Hgb A1c measurement: Goal of therapy: <6.5 Hgb A1c  Reference: American Diabetes Association: Clinical Practice Recommendations 2010, Diabetes Care, 2010, 33: (Suppl  1).   MPG 108 10/23/2009   No results found for: PROLACTIN Lab Results  Component Value Date   CHOL  10/23/2009    181        ATP III CLASSIFICATION:  <200     mg/dL   Desirable  [id number]  mg/dL   Borderline High  >=045>=240    mg/dL   High          TRIG 69 10/23/2009   HDL 88 10/23/2009   CHOLHDL 2.1 10/23/2009   VLDL 14 10/23/2009   LDLCALC  10/23/2009    79        Total Cholesterol/HDL:CHD Risk Coronary Heart Disease Risk Table                     Men   Women  1/2 Average Risk   3.4   3.3  Average Risk       5.0   4.4  2 X Average Risk   9.6   7.1  3 X Average Risk  23.4   11.0        Use the calculated Patient Ratio above and the CHD Risk Table to determine the patient's CHD Risk.        ATP III CLASSIFICATION (LDL):  <100     mg/dL   Optimal  [id number]  mg/dL   Near or Above                    Optimal  130-159  mg/dL   Borderline  [id number]  mg/dL   High  >956>190     mg/dL   Very High    Physical Findings: AIMS:  , ,  ,  ,    CIWA:  CIWA-Ar Total: 11 COWS:     Musculoskeletal: Strength & Muscle Tone: within normal limits Gait & Station: normal Patient leans: N/A  Psychiatric Specialty Exam: Physical Exam  Nursing note and vitals reviewed. Constitutional: She is oriented  to person, place, and time. She appears well-developed and well-nourished.  HENT:  Head: Normocephalic and atraumatic.  Respiratory: Effort normal.  Neurological: She is alert and oriented to person, place, and time.    ROS  Blood pressure 106/78, pulse 85, temperature 98.3 F (36.8 C), temperature source Oral, resp. rate 20, height 5\' 1"  (1.549 m), weight 59.4 kg.Body mass index is 24.75 kg/m.  General Appearance: Casual  Eye Contact:  Fair  Speech:  Normal Rate  Volume:  Normal  Mood:  Anxious and Depressed  Affect:  Congruent  Thought Process:  Coherent and Descriptions of Associations: Intact  Orientation:  Full (Time, Place, and Person)  Thought Content:  Logical  Suicidal Thoughts:  No  Homicidal Thoughts:  No  Memory:  Immediate;   Fair Recent;   Fair Remote;   Fair  Judgement:  Intact  Insight:  Fair  Psychomotor Activity:  Increased  Concentration:  Concentration: Fair and Attention Span: Fair  Recall:  FiservFair  Fund of Knowledge:  Fair  Language:  Fair  Akathisia:  Negative  Handed:  Right  AIMS (if indicated):  Assets:  Communication Skills Desire for Improvement Physical Health Resilience  ADL's:  Intact  Cognition:  WNL  Sleep:  Number of Hours: 5.75     Treatment Plan Summary: Daily contact with patient to assess and evaluate symptoms and progress in treatment, Medication management and Plan : Patient is seen and examined.  Patient is a 63 year old female with the above-stated past psychiatric history who is seen in follow-up.  Patient continues to complain from pain from the fall that led to her external hematoma as well as her subdural hematoma.  Initially on admission her medications flagged allergies to tramadol as well as nonsteroidal anti-inflammatories.  She stated she can take hydrocodone.  I have written for hydrocodone today for pain.  I have also increased her Cymbalta to 60 mg p.o. daily for her anxiety and depression.  She also is not sleeping  well, and I asked her today about which medication had been better either the Risperdal or the Seroquel.  She felt like the Risperdal was better, so I have increased her Risperdal dosage to 2 mg p.o. nightly. 1.  Continue Librium 25 mg p.o. 4 times daily as needed withdrawal symptoms CIWA greater than 10. 2.  Increase Cymbalta to 60 mg p.o. daily for anxiety and depression. 3.  Increase gabapentin to 600 mg p.o. 3 times daily for anxiety and chronic pain. 4.  Continue Keppra 500 mg p.o. twice daily for seizure disorder. 5.  Add hydrocodone 5/325 1-2 tabs p.o. every 6 hours as needed pain. 6.  Continue pantoprazole 40 mg p.o. daily for GERD. 7.  Continue Zofran 4 mg p.o. every 8 hours as needed nausea and vomiting. 8.  Stop Seroquel 50 mg p.o. nightly. 9.  Increase Risperdal to 2 mg p.o. nightly for auditory hallucinations and sleep. 10.  Disposition planning-hopefully she will get into [facility] for substance abuse rehabilitation. 11.  Recheck CMP for abnormalities and liver function enzymes and electrolytes.  Antonieta Pert, MD 12/19/2018, 12:43 PM

## 2018-12-19 NOTE — Plan of Care (Signed)
Progress note  D: pt presented to the medication window; compliant with medication administration. Pt states she slept well, except that she woke up with head pain that wouldn't allow her to go back to sleep. Pt also has complaints of dizziness, blurred vision, tremors, diarrhea, cravings, agitation, nausea and irritability. Pt rates her depression/hopelessness/anxiety a 2/3/2 out of 10 respectively. Pt states her goal for today is to talk to the dr and try and to make the right decisions. Pt will achieve this by talking to staff who can advise her. Pt denies si/hi/ah/vh and verbally agrees to approach staff if these become apparent or before harming herself/others while at [facility].  A: pt provided support and encouragement. Pt given medication per protocol and standing orders. Q62m safety checks implemented and continued.   Pt progressing in the following metrics  Problem: Education: Goal: Verbalization of understanding the information provided will improve Outcome: Progressing   Problem: Activity: Goal: Sleeping patterns will improve Outcome: Progressing   Problem: Coping: Goal: Ability to verbalize frustrations and anger appropriately will improve Outcome: Progressing Goal: Ability to demonstrate self-control will improve Outcome: Progressing

## 2018-12-19 NOTE — Progress Notes (Signed)
D: Pt denies SI/HI/AVH. Pt is pleasant and cooperative. Pt focused on pain medication. Pt stated she wanted to go to [facility] on D/C A: Pt was offered support and encouragement. Pt was given scheduled medications. Pt was encourage to attend groups. Q 15 minute checks were done for safety.  R:Pt attends groups and interacts well with peers and staff. Pt is taking medication. Pt has no complaints.Pt receptive to treatment and safety maintained on unit.

## 2018-12-19 NOTE — Progress Notes (Signed)
Recreation Therapy Notes  Date:  3.2.20 Time: 0930 Location: [address] Hall Dayroom  Group Topic: Stress Management  Goal Area(s) Addresses:  Patient will identify positive stress management techniques. Patient will identify benefits of using stress management post d/c.  Behavioral Response:  Engaged    Intervention: Stress Management  Activity :  Meditation.  LRT introduced the stress management technique of meditation.  LRT played a meditation that focused on having a productive and meaningful day.  Patients were to listen and follow along as meditation played.  Education:  Stress Management, Discharge Planning.   Education Outcome: Acknowledges Education  Clinical Observations/Feedback: Pt attend and participated in group.     Caroll Rancher, LRT/CTRS     Caroll Rancher A 12/19/2018 11:15 AM

## 2018-12-19 NOTE — Tx Team (Signed)
Interdisciplinary Treatment and Diagnostic Plan Update  12/19/2018 Time of Session: 9:00am [name] MRN: [id number]  Principal Diagnosis: MDD (major depressive disorder), recurrent severe, without psychosis (HCC)  Secondary Diagnoses: Principal Problem:   MDD (major depressive disorder), recurrent severe, without psychosis (HCC)   Current Medications:  Current Facility-Administered Medications  Medication Dose Route Frequency Provider Last Rate Last Dose  . acetaminophen (TYLENOL) tablet 650 mg  650 mg Oral Q6H PRN Antonieta Pert, MD   650 mg at 12/19/18 0328  . alum & mag hydroxide-simeth (MAALOX/MYLANTA) 200-200-20 MG/5ML suspension 30 mL  30 mL Oral Q4H PRN Laveda Abbe, NP      . chlordiazePOXIDE (LIBRIUM) capsule 25 mg  25 mg Oral QID PRN Antonieta Pert, MD   25 mg at 12/19/18 0744  . DULoxetine (CYMBALTA) DR capsule 30 mg  30 mg Oral Daily Antonieta Pert, MD   30 mg at 12/19/18 0744  . gabapentin (NEURONTIN) capsule 400 mg  400 mg Oral TID Laveda Abbe, NP   400 mg at 12/19/18 0744  . hydrOXYzine (ATARAX/VISTARIL) tablet 25 mg  25 mg Oral TID PRN Laveda Abbe, NP   25 mg at 12/18/18 2145  . levETIRAcetam (KEPPRA) tablet 500 mg  500 mg Oral BID Laveda Abbe, NP   500 mg at 12/19/18 0744  . magnesium hydroxide (MILK OF MAGNESIA) suspension 30 mL  30 mL Oral Daily PRN Laveda Abbe, NP      . nicotine (NICODERM CQ - dosed in mg/24 hours) patch 21 mg  21 mg Transdermal Daily Laveda Abbe, NP   21 mg at 12/19/18 0744  . ondansetron (ZOFRAN-ODT) disintegrating tablet 4 mg  4 mg Oral Q8H PRN Laveda Abbe, NP   4 mg at 12/18/18 8295  . pantoprazole (PROTONIX) EC tablet 40 mg  40 mg Oral Daily Laveda Abbe, NP   40 mg at 12/19/18 0744  . risperiDONE (RISPERDAL) tablet 0.5 mg  0.5 mg Oral BID PRN Antonieta Pert, MD      . thiamine (VITAMIN B-1) tablet 100 mg  100 mg Oral Daily Laveda Abbe, NP    100 mg at 12/19/18 6213   Or  . thiamine (B-1) injection 100 mg  100 mg Intravenous Daily Laveda Abbe, NP       PTA Medications: Medications Prior to Admission  Medication Sig Dispense Refill Last Dose  . DULoxetine (CYMBALTA) 30 MG capsule Take 1 capsule (30 mg total) by mouth daily. For depression (Patient not taking: Reported on 12/10/2018) 30 capsule 0 Not Taking at Unknown time  . folic acid (FOLVITE) 1 MG tablet Take 1 tablet (1 mg total) by mouth daily for 30 days. 30 tablet 0   . gabapentin (NEURONTIN) 400 MG capsule Take 1 capsule (400 mg total) by mouth 3 (three) times daily. For agitation (Patient not taking: Reported on 12/10/2018) 90 capsule 0 Not Taking at Unknown time  . hydrOXYzine (ATARAX/VISTARIL) 25 MG tablet Take 1 tablet (25 mg total) by mouth every 6 (six) hours as needed. For anxiety (Patient not taking: Reported on 12/10/2018) 60 tablet 0 Not Taking at Unknown time  . levETIRAcetam (KEPPRA) 500 MG tablet Take 1 tablet (500 mg total) by mouth 2 (two) times daily for 30 days. 60 tablet 0 12/15/2018  . Multiple Vitamin (MULTIVITAMIN WITH MINERALS) TABS tablet Take 1 tablet by mouth daily for 30 days. 30 tablet 0 unknown  . nicotine (NICODERM CQ - DOSED IN  MG/24 HOURS) 21 mg/24hr patch Place 1 patch (21 mg total) onto the skin daily. (May buy from over the counter): Smoking cessation (Patient not taking: Reported on 12/10/2018) 1 patch 0 Not Taking at Unknown time  . ondansetron (ZOFRAN ODT) 4 MG disintegrating tablet Take 1 tablet (4 mg total) by mouth every 8 (eight) hours as needed for nausea or vomiting. 15 tablet 0 Past Week at Unknown time  . pantoprazole (PROTONIX) 20 MG tablet Take 1 tablet (20 mg total) by mouth daily. For acid reflux (Patient not taking: Reported on 12/10/2018) 30 tablet 1 Not Taking at Unknown time  . thiamine 100 MG tablet Take 1 tablet (100 mg total) by mouth daily for 30 days. 30 tablet 0     Patient Stressors: Financial difficulties Marital  or family conflict Substance abuse  Patient Strengths: Capable of independent living Barrister's clerk for treatment/growth  Treatment Modalities: Medication Management, Group therapy, Case management,  1 to 1 session with clinician, Psychoeducation, Recreational therapy.   Physician Treatment Plan for Primary Diagnosis: MDD (major depressive disorder), recurrent severe, without psychosis (HCC) Long Term Goal(s): Improvement in symptoms so as ready for discharge Improvement in symptoms so as ready for discharge   Short Term Goals: Ability to identify changes in lifestyle to reduce recurrence of condition will improve Ability to verbalize feelings will improve Ability to disclose and discuss suicidal ideas Ability to demonstrate self-control will improve Ability to identify and develop effective coping behaviors will improve Ability to maintain clinical measurements within normal limits will improve Ability to identify triggers associated with substance abuse/mental health issues will improve Ability to identify changes in lifestyle to reduce recurrence of condition will improve Ability to verbalize feelings will improve Ability to disclose and discuss suicidal ideas Ability to demonstrate self-control will improve Ability to identify and develop effective coping behaviors will improve Ability to maintain clinical measurements within normal limits will improve Ability to identify triggers associated with substance abuse/mental health issues will improve  Medication Management: Evaluate patient's response, side effects, and tolerance of medication regimen.  Therapeutic Interventions: 1 to 1 sessions, Unit Group sessions and Medication administration.  Evaluation of Outcomes: Progressing  Physician Treatment Plan for Secondary Diagnosis: Principal Problem:   MDD (major depressive disorder), recurrent severe, without psychosis (HCC)  Long Term Goal(s): Improvement in  symptoms so as ready for discharge Improvement in symptoms so as ready for discharge   Short Term Goals: Ability to identify changes in lifestyle to reduce recurrence of condition will improve Ability to verbalize feelings will improve Ability to disclose and discuss suicidal ideas Ability to demonstrate self-control will improve Ability to identify and develop effective coping behaviors will improve Ability to maintain clinical measurements within normal limits will improve Ability to identify triggers associated with substance abuse/mental health issues will improve Ability to identify changes in lifestyle to reduce recurrence of condition will improve Ability to verbalize feelings will improve Ability to disclose and discuss suicidal ideas Ability to demonstrate self-control will improve Ability to identify and develop effective coping behaviors will improve Ability to maintain clinical measurements within normal limits will improve Ability to identify triggers associated with substance abuse/mental health issues will improve     Medication Management: Evaluate patient's response, side effects, and tolerance of medication regimen.  Therapeutic Interventions: 1 to 1 sessions, Unit Group sessions and Medication administration.  Evaluation of Outcomes: Progressing   RN Treatment Plan for Primary Diagnosis: MDD (major depressive disorder), recurrent severe, without psychosis (HCC) Long Term Goal(s):  Knowledge of disease and therapeutic regimen to maintain health will improve  Short Term Goals: Ability to demonstrate self-control, Ability to participate in decision making will improve and Ability to identify and develop effective coping behaviors will improve  Medication Management: RN will administer medications as ordered by provider, will assess and evaluate patient's response and provide education to patient for prescribed medication. RN will report any adverse and/or side effects to  prescribing provider.  Therapeutic Interventions: 1 on 1 counseling sessions, Psychoeducation, Medication administration, Evaluate responses to treatment, Monitor vital signs and CBGs as ordered, Perform/monitor CIWA, COWS, AIMS and Fall Risk screenings as ordered, Perform wound care treatments as ordered.  Evaluation of Outcomes: Progressing   LCSW Treatment Plan for Primary Diagnosis: MDD (major depressive disorder), recurrent severe, without psychosis (HCC) Long Term Goal(s): Safe transition to appropriate next level of care at discharge, Engage patient in therapeutic group addressing interpersonal concerns.  Short Term Goals: Engage patient in aftercare planning with referrals and resources, Increase social support, Increase emotional regulation, Identify triggers associated with mental health/substance abuse issues and Increase skills for wellness and recovery  Therapeutic Interventions: Assess for all discharge needs, 1 to 1 time with Social worker, Explore available resources and support systems, Assess for adequacy in community support network, Educate family and significant other(s) on suicide prevention, Complete Psychosocial Assessment, Interpersonal group therapy.  Evaluation of Outcomes: Progressing   Progress in Treatment: Attending groups: Yes. Participating in groups: Yes. Taking medication as prescribed: Yes. Toleration medication: Yes. Family/Significant other contact made: No, will contact:  completed with patient. Patient understands diagnosis: Yes. Discussing patient identified problems/goals with staff: Yes. Medical problems stabilized or resolved: Yes. Denies suicidal/homicidal ideation: Yes. Issues/concerns per patient self-inventory: Yes.  New problem(s) identified: Yes, Describe:  Patient is homeless. She is requesting residential treatment. She has court dates later this month, so [facility] may be her best option.  New Short Term/Long Term Goal(s): detox,  medication management for mood stabilization; elimination of SI thoughts; development of comprehensive mental wellness/sobriety plan.  Patient Goals:  "Stay sober, or I will die." Wants residential treatment.  Discharge Plan or Barriers: [facility] referral, [facility] with outpatient follow up as a back up.  Reason for Continuation of Hospitalization: Anxiety Depression Withdrawal symptoms  Estimated Length of Stay: 3-5 days  Attendees: Patient: [name] 12/19/2018 10:19 AM  Physician: Marguerita Merles 12/19/2018 10:19 AM  Nursing: Casimiro Needle, RN 12/19/2018 10:19 AM  RN Care Manager: 12/19/2018 10:19 AM  Social Worker: [name], LCSWA 12/19/2018 10:19 AM  Recreational Therapist:  12/19/2018 10:19 AM  Other:  12/19/2018 10:19 AM  Other:  12/19/2018 10:19 AM  Other: 12/19/2018 10:19 AM    Scribe for Treatment Team: Darreld Mclean, LCSWA 12/19/2018 10:19 AM

## 2018-12-19 NOTE — Progress Notes (Signed)
CSW faxed [facility] referral. [facility] confirms they have female bed availability, but don't expect to review patient's referral until tomorrow.   CSW to follow up.  [name], LCSW-A Clinical Social Worker

## 2018-12-20 LAB — COMPREHENSIVE METABOLIC PANEL
ALT: 32 U/L (ref 0–44)
AST: 31 U/L (ref 15–41)
Albumin: 4 g/dL (ref 3.5–5.0)
Alkaline Phosphatase: 54 U/L (ref 38–126)
Anion gap: 10 (ref 5–15)
BUN: 10 mg/dL (ref 8–23)
CHLORIDE: 108 mmol/L (ref 98–111)
CO2: 19 mmol/L — ABNORMAL LOW (ref 22–32)
Calcium: 9.1 mg/dL (ref 8.9–10.3)
Creatinine, Ser: 0.73 mg/dL (ref 0.44–1.00)
GFR calc Af Amer: >60 mL/min (ref >60)
GFR calc non Af Amer: >60 mL/min (ref >60)
GLUCOSE: 147 mg/dL — AB (ref 70–99)
Potassium: 3.8 mmol/L (ref 3.5–5.1)
Sodium: 137 mmol/L (ref 135–145)
Total Bilirubin: 0.4 mg/dL (ref 0.3–1.2)
Total Protein: 6.6 g/dL (ref 6.5–8.1)

## 2018-12-20 NOTE — Progress Notes (Signed)
D: Pt denies SI/HI/AVH. Pt is pleasant and cooperative. Pt stated she was ready to go to [facility] . Pt visible in dayroom watching TV with peers. Pt stated she had good day.  A: Pt was offered support and encouragement. Pt was given scheduled medications. Pt was encourage to attend groups. Q 15 minute checks were done for safety.   R:Pt attends groups and interacts well with peers and staff. Pt is taking medication. Pt has no complaints.Pt receptive to treatment and safety maintained on unit.

## 2018-12-20 NOTE — [facility] Group Notes (Signed)
Adult Psychoeducational Group Note  Date:  12/20/2018 Time:  8:43 PM  Group Topic/Focus:  Wrap-Up Group:   The focus of this group is to help patients review their daily goal of treatment and discuss progress on daily workbooks.  Participation Level:  Active  Participation Quality:  Appropriate and Attentive  Affect:  Appropriate  Cognitive:  Alert and Appropriate  Insight: Appropriate and Good  Engagement in Group:  Engaged  Modes of Intervention:  Discussion and Education  Additional Comments:  Pt attended and participated in wrap up group this evening. Pt rated their day a 5/10, due to them getting helpful information from mental health group. Pt completed their goal to feel better about themselves and to have hope.   Chrisandra Netters 12/20/2018, 8:43 PM

## 2018-12-20 NOTE — Plan of Care (Signed)
  Problem: Activity: Goal: Interest or engagement in activities will improve Outcome: Progressing   Problem: Coping: Goal: Ability to verbalize frustrations and anger appropriately will improve Outcome: Progressing   D: Pt alert and oriented on the unit. Pt engaging with RN staff and other pts. Pt denies SI/HI, A/VH. Pt's goal for today is "Go to a rehab center." Pt also participated during unit groups and activities. Pt is pleasant and cooperative. A: Education, support and encouragement provided, q15 minute safety checks remain in effect. Medications administered per MD orders. R: No reactions/side effects to medicine noted. Pt denies any concerns at this time, and verbally contracts for safety. Pt ambulating on the unit with no issues. Pt remains safe on and off the unit.

## 2018-12-20 NOTE — Progress Notes (Addendum)
[facility] MD Progress Note  12/20/2018 10:59 AM [name]  MRN:  [id number] Subjective:  "I'm starting to get some hope back."  [name] found sitting in dayroom. She reports improving mood and shows fuller range of affect. She does report some anxiety regarding discharge planning. Pain (leg and headache) from seizure-related fall prior to admission continues to be a concern but is improving with PRN Norco started yesterday. Gabapentin and Cymbalta were also increased yesterday. She appears future-oriented, stating she is looking forward to [facility] admission and returning to AA. She has been participating in groups. States she has realized she needs to stay sober for daughter and grandchildren.  Her fiance is currently in prison, which she states will help her stay sober as they typically drink together. Risperdal was restarted last night. Denies AVH. She reports improved sleep but waking up twice due to pain. Record shows 6.5 hours of sleep. Denies SI. Denies medication side effects. Denies withdrawal symptoms.  From admission H&P: Patient is a 63 year old female with a past psychiatric history significant for alcohol dependence with withdrawal seizures, history of DTs, anxiety, depression, chronic pain who originally presented to the [facility] emergency department on 12/11/2018 after having a seizure. The patient stated she had no recollection of the event. She was alert and oriented, but per EMS she had a witnessed seizure at a bus stop. The patient stated she had been treated for seizure disorders in the past as well as depression but had not had any medication for several weeks. She was also trying to quit alcohol on her own. Her last drink was reportedly on 12/09/2018. She was admitted to the [facility] long medical service where she was evaluated after her seizure.Unfortunately she was found to have a subarachnoid hemorrhage which was diagnosed on 2/22 after her head injury. She  was discharged home on 2/27, but returned to the emergency department on 2/28 because of headache and suicidal ideation. She stated she continued to have a headache after her fall.   Principal Problem: MDD (major depressive disorder), recurrent severe, without psychosis (HCC) Diagnosis: Principal Problem:   MDD (major depressive disorder), recurrent severe, without psychosis (HCC)  Total Time spent with patient: 20 minutes  Past Psychiatric History: See admission H&P  Past Medical History:  Past Medical History:  Diagnosis Date  . Alcohol dependence (HCC)   . Anxiety   . Chronic pain   . Depression   . Herniated cervical disc     Past Surgical History:  Procedure Laterality Date  . ABDOMINAL HYSTERECTOMY    . ANKLE SURGERY Right   . CARPAL TUNNEL RELEASE Bilateral   . CERVICAL FUSION    . CHOLECYSTECTOMY    . KNEE SURGERY     Family History: History reviewed. No pertinent family history. Family Psychiatric  History: See admission H&P Social History:  Social History   Substance and Sexual Activity  Alcohol Use Yes  . Alcohol/week: 3.0 standard drinks  . Types: 3 Standard drinks or equivalent per week   Comment: daily use.     Social History   Substance and Sexual Activity  Drug Use Yes  . Types: Marijuana    Social History   Socioeconomic History  . Marital status: Single    Spouse name: Not on file  . Number of children: Not on file  . Years of education: Not on file  . Highest education level: Not on file  Occupational History  . Not on file  Social Needs  .  Financial resource strain: Somewhat hard  . Food insecurity:    Worry: Sometimes true    Inability: Sometimes true  . Transportation needs:    Medical: No    Non-medical: No  Tobacco Use  . Smoking status: Current Every Day Smoker    Packs/day: 0.50    Years: 7.00    Pack years: 3.50    Types: Cigarettes  . Smokeless tobacco: Never Used  Substance and Sexual Activity  . Alcohol use: Yes     Alcohol/week: 3.0 standard drinks    Types: 3 Standard drinks or equivalent per week    Comment: daily use.  . Drug use: Yes    Types: Marijuana  . Sexual activity: Not Currently  Lifestyle  . Physical activity:    Days per week: Patient refused    Minutes per session: Patient refused  . Stress: Only a little  Relationships  . Social connections:    Talks on phone: Patient refused    Gets together: Patient refused    Attends religious service: Patient refused    Active member of club or organization: Patient refused    Attends meetings of clubs or organizations: Patient refused    Relationship status: Patient refused  Other Topics Concern  . Not on file  Social History Narrative   Pt lives in a [employer]; lives with boyfriend.   Additional Social History:         Sleep: Fair  Appetite:  Fair  Current Medications: Current Facility-Administered Medications  Medication Dose Route Frequency Provider Last Rate Last Dose  . alum & mag hydroxide-simeth (MAALOX/MYLANTA) 200-200-20 MG/5ML suspension 30 mL  30 mL Oral Q4H PRN Laveda Abbe, NP      . chlordiazePOXIDE (LIBRIUM) capsule 25 mg  25 mg Oral QID PRN Antonieta Pert, MD   25 mg at 12/19/18 0744  . DULoxetine (CYMBALTA) DR capsule 60 mg  60 mg Oral Daily Antonieta Pert, MD   60 mg at 12/20/18 0744  . gabapentin (NEURONTIN) capsule 400 mg  400 mg Oral TID Laveda Abbe, NP   400 mg at 12/20/18 0744  . HYDROcodone-acetaminophen (NORCO/VICODIN) 5-325 MG per tablet 2 tablet  2 tablet Oral Q6H PRN Antonieta Pert, MD   2 tablet at 12/20/18 0601  . hydrOXYzine (ATARAX/VISTARIL) tablet 25 mg  25 mg Oral TID PRN Laveda Abbe, NP   25 mg at 12/19/18 2125  . levETIRAcetam (KEPPRA) tablet 500 mg  500 mg Oral BID Laveda Abbe, NP   500 mg at 12/20/18 0744  . magnesium hydroxide (MILK OF MAGNESIA) suspension 30 mL  30 mL Oral Daily PRN Laveda Abbe, NP      . nicotine (NICODERM CQ -  dosed in mg/24 hours) patch 21 mg  21 mg Transdermal Daily Laveda Abbe, NP   21 mg at 12/20/18 0744  . ondansetron (ZOFRAN-ODT) disintegrating tablet 4 mg  4 mg Oral Q8H PRN Laveda Abbe, NP   4 mg at 12/18/18 0677  . pantoprazole (PROTONIX) EC tablet 40 mg  40 mg Oral Daily Laveda Abbe, NP   40 mg at 12/20/18 0744  . risperiDONE (RISPERDAL) tablet 2 mg  2 mg Oral BID PRN Antonieta Pert, MD   2 mg at 12/19/18 2125  . thiamine (VITAMIN B-1) tablet 100 mg  100 mg Oral Daily Laveda Abbe, NP   100 mg at 12/20/18 0744   Or  . thiamine (B-1) injection 100  mg  100 mg Intravenous Daily Laveda AbbeParks, Laurie Britton, NP        Lab Results:  Results for orders placed or performed during the hospital encounter of 12/17/18 (from the past 48 hour(s))  Comprehensive metabolic panel     Status: Abnormal   Collection Time: 12/20/18  6:34 AM  Result Value Ref Range   Sodium 137 135 - 145 mmol/L   Potassium 3.8 3.5 - 5.1 mmol/L   Chloride 108 98 - 111 mmol/L   CO2 19 (L) 22 - 32 mmol/L   Glucose, Bld 147 (H) 70 - 99 mg/dL   BUN 10 8 - 23 mg/dL   Creatinine, Ser 4.090.73 0.44 - 1.00 mg/dL   Calcium 9.1 8.9 - 81.110.3 mg/dL   Total Protein 6.6 6.5 - 8.1 g/dL   Albumin 4.0 3.5 - 5.0 g/dL   AST 31 15 - 41 U/L   ALT 32 0 - 44 U/L   Alkaline Phosphatase 54 38 - 126 U/L   Total Bilirubin 0.4 0.3 - 1.2 mg/dL   GFR calc non Af Amer >60 >60 mL/min   GFR calc Af Amer >60 >60 mL/min   Anion gap 10 5 - 15    Comment: [facility], [address] [place], [place] [address]    Blood Alcohol level:  Lab Results  Component Value Date   [facility] <10 12/16/2018   ETH <10 12/11/2018    Metabolic Disorder Labs: Lab Results  Component Value Date   HGBA1C  10/23/2009    5.4 (NOTE) The ADA recommends the following therapeutic goal for glycemic control related to Hgb A1c measurement: Goal of therapy: <6.5 Hgb A1c  Reference: American Diabetes Association:  Clinical Practice Recommendations 2010, Diabetes Care, 2010, 33: (Suppl  1).   MPG 108 10/23/2009   No results found for: PROLACTIN Lab Results  Component Value Date   CHOL  10/23/2009    181        ATP III CLASSIFICATION:  <200     mg/dL   Desirable  [id number]  mg/dL   Borderline High  >=308>=240    mg/dL   High          TRIG 69 10/23/2009   HDL 88 10/23/2009   CHOLHDL 2.1 10/23/2009   VLDL 14 10/23/2009   LDLCALC  10/23/2009    79        Total Cholesterol/HDL:CHD Risk Coronary Heart Disease Risk Table                     Men   Women  1/2 Average Risk   3.4   3.3  Average Risk       5.0   4.4  2 X Average Risk   9.6   7.1  3 X Average Risk  23.4   11.0        Use the calculated Patient Ratio above and the CHD Risk Table to determine the patient's CHD Risk.        ATP III CLASSIFICATION (LDL):  <100     mg/dL   Optimal  [id number]  mg/dL   Near or Above                    Optimal  130-159  mg/dL   Borderline  [phone]  mg/dL   High  >841>190     mg/dL   Very High    Physical Findings: AIMS:  , ,  ,  ,  CIWA:  CIWA-Ar Total: 11 COWS:     Musculoskeletal: Strength & Muscle Tone: within normal limits Gait & Station: normal Patient leans: N/A  Psychiatric Specialty Exam: Physical Exam  Nursing note and vitals reviewed. Constitutional: She is oriented to person, place, and time. She appears well-developed and well-nourished.  Cardiovascular: Normal rate.  Respiratory: Effort normal.  Neurological: She is alert and oriented to person, place, and time.    Review of Systems  Constitutional: Negative.   Respiratory: Negative for cough and shortness of breath.   Cardiovascular: Negative for chest pain.  Gastrointestinal: Negative for nausea and vomiting.  Musculoskeletal: Positive for myalgias.  Neurological: Positive for headaches.  Psychiatric/Behavioral: Positive for depression and substance abuse (ETOH). Negative for hallucinations, memory loss and suicidal ideas. The  patient is nervous/anxious. The patient does not have insomnia.     Blood pressure 117/84, pulse (!) 110, temperature 99 F (37.2 C), temperature source Oral, resp. rate 16, height 5\' 1"  (1.549 m), weight 59.4 kg.Body mass index is 24.75 kg/m.  General Appearance: Fairly Groomed  Eye Contact:  Good  Speech:  Normal Rate  Volume:  Normal  Mood:  Anxious  Affect:  Congruent  Thought Process:  Coherent  Orientation:  Full (Time, Place, and Person)  Thought Content:  WDL  Suicidal Thoughts:  No  Homicidal Thoughts:  No  Memory:  Immediate;   Good Recent;   Fair  Judgement:  Intact  Insight:  Fair  Psychomotor Activity:  Normal  Concentration:  Concentration: Good  Recall:  Good  Fund of Knowledge:  Fair  Language:  Good  Akathisia:  No  Handed:  Right  AIMS (if indicated):     Assets:  Communication Skills Desire for Improvement Leisure Time Resilience  ADL's:  Intact  Cognition:  WNL  Sleep:  Number of Hours: 6.5     Treatment Plan Summary: Daily contact with patient to assess and evaluate symptoms and progress in treatment and Medication management   Continue inpatient hospitalization.  Continue Cymbalta 60 mg PO daily for mood/anxiety Continue Vistaril 25 mg PO TID PRN anxiety Continue gabapentin 400 mg PO TID for pain Continue Risperdal 2 mg PO BID PRN psychosis Continue Librium 25 mg PO QID PRN CIWA>10 Continue Norco 5-325 mg PO Q6HR PRN pain Continue Keppra 500 mg PO BID for seizures Continue Protonix 40 mg PO daily for GERD Continue thiamine 100 mg PO daily for supplementation  Patient will participate in the therapeutic group milieu.  Discharge disposition in progress.   Aldean Baker, NP 12/20/2018, 10:59 AM   With NP progress note

## 2018-12-21 MED ORDER — HYDROCODONE-ACETAMINOPHEN 5-325 MG PO TABS
1.0000 | ORAL_TABLET | Freq: Three times a day (TID) | ORAL | Status: DC | PRN
  Administered 2018-12-21 – 2018-12-22 (×2): 1 via ORAL
  Filled 2018-12-21 (×2): qty 1

## 2018-12-21 MED ORDER — HYDROCODONE-ACETAMINOPHEN 5-325 MG PO TABS: 2.0000 | ORAL_TABLET | Freq: Three times a day (TID) | ORAL | Status: DC | PRN

## 2018-12-21 NOTE — Progress Notes (Signed)
The patient attended the evening A.A.meeting and was appropriate.  

## 2018-12-21 NOTE — Progress Notes (Signed)
CSW left voicemail for Amy, intake coordinator at [facility]. CSW spoke with desk staff who confirms patient has been accepted and is waiting on an available bed (CSW told on Monday 03/02 that beds were available). CSW will follow up with [facility] in the hopes of patient discharging directly to the facility as soon as tomorrow.  [name], LCSW-A Clinical Social Worker

## 2018-12-21 NOTE — Progress Notes (Signed)
Patient ID: [name], female   DOB: [birth year], 63 y.o.   MRN: [id number] D: Patient in dayroom on approach. Pt reports she is upset that her pain medicine dosage was decreased without her knowledge. Pt mood and affect appeared anxious. Pt denies SI/HI/AVH and pain. Pt attended and participated in evening NA group. Cooperative with assessment. No acute distressed noted at this time.   A: Medications administered as prescribed. Support and encouragement provided as needed. Pt encouraged to discuss feelings and come to staff with any question or concerns.   R: Patient remains safe and complaint with medications.

## 2018-12-21 NOTE — Progress Notes (Addendum)
[facility] MD Progress Note  12/21/2018 4:34 PM Tanishia Lemaster  MRN:  [id number] Subjective: Patient reports some improvement compared to how she felt at admission.  In particular states that her mood has improved partially and that she now feels more hopeful, motivated and going to a rehab setting, hopeful to get into [facility].  Currently does not endorse medication side effects.  Reports some residual withdrawal symptoms, mainly a vague sense of restlessness, but overall acknowledges significant improvement.  Reports headache-still significant but gradually improving-secondary to recent head trauma.  Objective: I have discussed case with treatment team and have met with patient 63 year old female, history of alcohol dependence, recent seizure, felt to be related to withdrawal.  Recent head injury/subarachnoid hemorrhage on 2/22 following a witnessed seizure.  Describes history of depression/ grief symptoms related to death of loved ones.  Today describes feeling better than she did on admission.  She does present with improving grooming, improved eye contact and a partially improved range of affect.  Currently future oriented and as above interested and motivated and going to a rehab setting at discharge.  Currently presents calm and in no acute distress but states she still feels vaguely/subjectively restless.  Mild tachycardia.  No diaphoresis.  No distal tremors noted.  Denies visual disturbances.  Headache post head injury gradually improving but still significant and requiring analgesia.  No disruptive or agitated behaviors on unit.  Pleasant on approach.  Really tolerating medications well, denies side effects. More visible on unit, spending time in dayroom, more interactive with peers and staff.  Principal Problem: MDD (major depressive disorder), recurrent severe, without psychosis ([place]) Diagnosis: Principal Problem:   MDD (major depressive disorder), recurrent severe, without psychosis  ([place])  Total Time spent with patient: 20 minutes  Past Psychiatric History: See admission H&P  Past Medical History:  Past Medical History:  Diagnosis Date  . Alcohol dependence ([place])   . Anxiety   . Chronic pain   . Depression   . Herniated cervical disc     Past Surgical History:  Procedure Laterality Date  . ABDOMINAL HYSTERECTOMY    . ANKLE SURGERY Right   . CARPAL TUNNEL RELEASE Bilateral   . CERVICAL FUSION    . CHOLECYSTECTOMY    . KNEE SURGERY     Family History: History reviewed. No pertinent family history. Family Psychiatric  History: See admission H&P Social History:  Social History   Substance and Sexual Activity  Alcohol Use Yes  . Alcohol/week: 3.0 standard drinks  . Types: 3 Standard drinks or equivalent per week   Comment: daily use.     Social History   Substance and Sexual Activity  Drug Use Yes  . Types: Marijuana    Social History   Socioeconomic History  . Marital status: Single    Spouse name: Not on file  . Number of children: Not on file  . Years of education: Not on file  . Highest education level: Not on file  Occupational History  . Not on file  Social Needs  . Financial resource strain: Somewhat hard  . Food insecurity:    Worry: Sometimes true    Inability: Sometimes true  . Transportation needs:    Medical: No    Non-medical: No  Tobacco Use  . Smoking status: Current Every Day Smoker    Packs/day: 0.50    Years: 7.00    Pack years: 3.50    Types: Cigarettes  . Smokeless tobacco: Never Used  Substance and Sexual  Activity  . Alcohol use: Yes    Alcohol/week: 3.0 standard drinks    Types: 3 Standard drinks or equivalent per week    Comment: daily use.  . Drug use: Yes    Types: Marijuana  . Sexual activity: Not Currently  Lifestyle  . Physical activity:    Days per week: Patient refused    Minutes per session: Patient refused  . Stress: Only a little  Relationships  . Social connections:    Talks on phone:  Patient refused    Gets together: Patient refused    Attends religious service: Patient refused    Active member of club or organization: Patient refused    Attends meetings of clubs or organizations: Patient refused    Relationship status: Patient refused  Other Topics Concern  . Not on file  Social History Narrative   Pt lives in a [place] [address]; lives with boyfriend.   Additional Social History:         Sleep: Improving  Appetite:  Improving  Current Medications: Current Facility-Administered Medications  Medication Dose Route Frequency Provider Last Rate Last Dose  . alum & mag hydroxide-simeth (MAALOX/MYLANTA) 200-200-20 MG/5ML suspension 30 mL  30 mL Oral Q4H PRN Ethelene Hal, NP      . chlordiazePOXIDE (LIBRIUM) capsule 25 mg  25 mg Oral QID PRN Sharma Covert, MD   25 mg at 12/19/18 0744  . DULoxetine (CYMBALTA) DR capsule 60 mg  60 mg Oral Daily Sharma Covert, MD   60 mg at 12/21/18 0746  . gabapentin (NEURONTIN) capsule 400 mg  400 mg Oral TID Ethelene Hal, NP   400 mg at 12/21/18 1214  . HYDROcodone-acetaminophen (NORCO/VICODIN) 5-325 MG per tablet 2 tablet  2 tablet Oral Q6H PRN Sharma Covert, MD   2 tablet at 12/21/18 1214  . hydrOXYzine (ATARAX/VISTARIL) tablet 25 mg  25 mg Oral TID PRN Ethelene Hal, NP   25 mg at 12/21/18 0746  . levETIRAcetam (KEPPRA) tablet 500 mg  500 mg Oral BID Ethelene Hal, NP   500 mg at 12/21/18 0746  . magnesium hydroxide (MILK OF MAGNESIA) suspension 30 mL  30 mL Oral Daily PRN Ethelene Hal, NP      . nicotine (NICODERM CQ - dosed in mg/24 hours) patch 21 mg  21 mg Transdermal Daily Ethelene Hal, NP   21 mg at 12/21/18 0748  . ondansetron (ZOFRAN-ODT) disintegrating tablet 4 mg  4 mg Oral Q8H PRN Ethelene Hal, NP   4 mg at 12/18/18 9323  . pantoprazole (PROTONIX) EC tablet 40 mg  40 mg Oral Daily Ethelene Hal, NP   40 mg at 12/21/18 0746  . risperiDONE  (RISPERDAL) tablet 2 mg  2 mg Oral BID PRN Sharma Covert, MD   2 mg at 12/20/18 2119  . thiamine (VITAMIN B-1) tablet 100 mg  100 mg Oral Daily Ethelene Hal, NP   100 mg at 12/21/18 5573   Or  . thiamine (B-1) injection 100 mg  100 mg Intravenous Daily Ethelene Hal, NP        Lab Results:  Results for orders placed or performed during the hospital encounter of 12/17/18 (from the past 48 hour(s))  Comprehensive metabolic panel     Status: Abnormal   Collection Time: 12/20/18  6:34 AM  Result Value Ref Range   Sodium 137 135 - 145 mmol/L   Potassium 3.8 3.5 - 5.1 mmol/L  Chloride 108 98 - 111 mmol/L   CO2 19 (L) 22 - 32 mmol/L   Glucose, Bld 147 (H) 70 - 99 mg/dL   BUN 10 8 - 23 mg/dL   Creatinine, Ser 0.73 0.44 - 1.00 mg/dL   Calcium 9.1 8.9 - 10.3 mg/dL   Total Protein 6.6 6.5 - 8.1 g/dL   Albumin 4.0 3.5 - 5.0 g/dL   AST 31 15 - 41 U/L   ALT 32 0 - 44 U/L   Alkaline Phosphatase 54 38 - 126 U/L   Total Bilirubin 0.4 0.3 - 1.2 mg/dL   GFR calc non Af Amer >60 >60 mL/min   GFR calc Af Amer >60 >60 mL/min   Anion gap 10 5 - 15    Comment: Performed at [facility], [place] [address], [place], [place] [address]    Blood Alcohol level:  Lab Results  Component Value Date   [facility] <10 12/16/2018   ETH <10 77/82/4235    Metabolic Disorder Labs: Lab Results  Component Value Date   HGBA1C  10/23/2009    5.4 (NOTE) The ADA recommends the following therapeutic goal for glycemic control related to Hgb A1c measurement: Goal of therapy: <6.5 Hgb A1c  Reference: American Diabetes Association: Clinical Practice Recommendations 2010, Diabetes Care, 2010, 33: (Suppl  1).   MPG 108 10/23/2009   No results found for: PROLACTIN Lab Results  Component Value Date   CHOL  10/23/2009    181        ATP III CLASSIFICATION:  <200     mg/dL   Desirable  200-239  mg/dL   Borderline High  >=240    mg/dL   High          TRIG 69 10/23/2009   HDL 88  10/23/2009   CHOLHDL 2.1 10/23/2009   VLDL 14 10/23/2009   LDLCALC  10/23/2009    79        Total Cholesterol/HDL:CHD Risk Coronary Heart Disease Risk Table                     Men   Women  1/2 Average Risk   3.4   3.3  Average Risk       5.0   4.4  2 X Average Risk   9.6   7.1  3 X Average Risk  23.4   11.0        Use the calculated Patient Ratio above and the CHD Risk Table to determine the patient's CHD Risk.        ATP III CLASSIFICATION (LDL):  <100     mg/dL   Optimal  100-129  mg/dL   Near or Above                    Optimal  130-159  mg/dL   Borderline  160-189  mg/dL   High  >190     mg/dL   Very High    Physical Findings: AIMS:  , ,  ,  ,    CIWA:  CIWA-Ar Total: 11 COWS:     Musculoskeletal: Strength & Muscle Tone: within normal limits-no significant psychomotor agitation or restlessness noted, no distal tremors noted. Gait & Station: normal Patient leans: N/A  Psychiatric Specialty Exam: Physical Exam  Nursing note and vitals reviewed. Constitutional: She is oriented to person, place, and time. She appears well-developed and well-nourished.  Cardiovascular: Normal rate.  Respiratory: Effort normal.  Neurological: She is alert  and oriented to person, place, and time.    Review of Systems  Constitutional: Negative.   Respiratory: Negative for cough and shortness of breath.   Cardiovascular: Negative for chest pain.  Gastrointestinal: Negative for nausea and vomiting.  Musculoskeletal: Positive for myalgias.  Neurological: Positive for headaches.  Psychiatric/Behavioral: Positive for depression and substance abuse (ETOH). Negative for hallucinations, memory loss and suicidal ideas. The patient is nervous/anxious. The patient does not have insomnia.   Reports headache stemming from recent head injury.  No chest pain, no shortness of breath, no vomiting.  Blood pressure 112/77, pulse (!) 110, temperature 98.8 F (37.1 C), temperature source Oral, resp.  rate 16, height 5' 1"  (1.549 m), weight 59.4 kg.Body mass index is 24.75 kg/m.  General Appearance: Improving grooming  Eye Contact:  Good  Speech:  Normal Rate  Volume:  Normal  Mood:  Improving mood, reports she is feeling better  Affect:  Affect noted to be more reactive, smiles at times appropriately during session  Thought Process:  Linear and Descriptions of Associations: Intact  Orientation:  Other:  Fully alert and attentive  Thought Content:  No hallucinations, no delusions  Suicidal Thoughts:  No today denies suicidal or self-injurious ideations, contracts for safety on unit, does not endorse any homicidal or violent ideations  Homicidal Thoughts:  No  Memory:  Recent and remote grossly intact  Judgement:  Other:  Improving  Insight:  Fair and Improving  Psychomotor Activity:  Normal-no psychomotor restlessness or agitation  Concentration:  Concentration: Good and Attention Span: Good  Recall:  Good  Fund of Knowledge:  Good  Language:  Good  Akathisia:  No  Handed:  Right  AIMS (if indicated):     Assets:  Communication Skills Desire for Improvement Leisure Time Resilience  ADL's:  Intact  Cognition:  WNL  Sleep:  Number of Hours: 6   Assessment:  63 year old female, history of alcohol dependence, recent seizure, felt to be related to withdrawal.  Recent head injury/subarachnoid hemorrhage on 2/22 following a witnessed seizure.  Describes history of depression/ grief symptoms related to death of loved ones.   Patient reports improving mood and does present with a fuller range of affect, smiling at times during session, more future oriented and focused on going to a rehab setting at discharge.  Feels motivated in working on sobriety/relapse prevention efforts.  She continues to report some mild subjective symptoms of withdrawal but does not appear to be in any acute distress or discomfort at this time.  She does complain of a headache following her recent head trauma/head  injury, which she states is gradually subsiding.  Of note she is fully alert, attentive, no evidence of delirium or confusion at this time.  There have been no seizures since her admission.   Treatment Plan Summary: Daily contact with patient to assess and evaluate symptoms and progress in treatment and Medication management  Treatment plan reviewed as below today 3/4 Encourage efforts to work on sobriety and relapse prevention Encourage group and milieu participation to work on Radiographer, therapeutic and symptom reduction Treatment team working on disposition planning options-as above interested in going to [facility]  Continue Cymbalta 60 mg PO daily for mood/anxiety Continue Vistaril 25 mg PO TID PRN anxiety Continue Nabapentin 400 mg PO TID for pain Continue Risperdal 2 mg PO BID PRN psychosis Continue Librium 25 mg PO QID PRN CIWA>10 Decrease Norco to 5-325 mg PO Q8HR PRN pain, as reports pain level gradually subsiding  Continue  Keppra 500 mg PO BID for seizures Continue Protonix 40 mg PO daily for GERD Continue Thiamine 100 mg PO daily for supplementation   Jenne Campus, MD 12/21/2018, 4:34 PM   Patient ID: Luvenia Heller, female   DOB: [birth year], 63 y.o.   MRN: [id number]

## 2018-12-21 NOTE — Plan of Care (Signed)
  Problem: Activity: Goal: Interest or engagement in activities will improve Outcome: Progressing   Problem: Coping: Goal: Ability to verbalize frustrations and anger appropriately will improve Outcome: Progressing   D: Pt alert and oriented on the unit and denies SI/HI, A/VH. Pt participated during unit groups and activities and is cooperative. Pt rated her depression a 4.5, feelings of hopelessness a 5, and anxiety a 4, with 10 being the worst. Pt's goal for today is "to get into [facility] as soon as possible and go from there."  A: Education, support and encouragement provided, q15 minute safety checks remain in effect. Medications administered per MD orders.  R: No reactions/side effects to medicine noted. Pt denies any concerns at this time, and verbally contracts for safety. Pt ambulating on the unit with no issues. Pt remains safe on and off the unit.

## 2018-12-21 NOTE — BHH Group Notes (Signed)
Occupational Therapy Group Note  Date:  12/21/2018 Time:  12:27 PM  Group Topic/Focus:  Stress Management  Participation Level:  Active  Participation Quality:  Appropriate  Affect:  Blunted  Cognitive:  Appropriate  Insight: Improving  Engagement in Group:  Engaged  Modes of Intervention:  Activity, Discussion, Education and Socialization  Additional Comments:    S: "When I am drinking, I turn mean and cannot act like I used "  O: Stress management group completed to use as productive coping strategy, to help mitigate maladaptive coping to integrate in functional BADL/IADL. Stress management tool worksheet discussed to educate on unhealthy vs healthy coping skills to manage stress to improve community integration. Coping strategies taught include: relaxation based- deep breathing, counting to 10, taking a 1 minute vacation, acceptance, stress balls, relaxation audio/video, visual/mental imagery. Positive mental attitude- gratitude, acceptance, cognitive reframing, positive self talk, anger management. Coping skills bingo played with education given on variety of coping skills between bingo calls. Pts encouraged to share experience with various coping skills and share what has worked for them with others. Coloring and relaxation guide handouts given at the end of the session.   A: Pt presents with blunted affect, engaged and participatory offering many personal examples pertaining to her daily life. She shares that she often uses substance use and suppression in regards to stress. She is willing to practice some thought based management strategies as well as relaxation.  P: OT group will be x1 per week while pt inpatient.  Dalphine Handing, MSOT, OTR/L Behavioral Health OT/ Acute Relief OT PHP Office: [phone]  Dalphine Handing 12/21/2018, 12:27 PM

## 2018-12-22 MED ORDER — HYDROCODONE-ACETAMINOPHEN 5-325 MG PO TABS
1.0000 | ORAL_TABLET | Freq: Four times a day (QID) | ORAL | Status: DC | PRN
  Administered 2018-12-22 – 2018-12-25 (×9): 1 via ORAL
  Filled 2018-12-22 (×9): qty 1

## 2018-12-22 NOTE — Plan of Care (Signed)
D: Adalyn has been concerned about her daughter, who she said was undergoing emergency surgery at [facility] today. She denied SI, HI, and AVH. Her concern was her head pain, and she felt she had not been adequately apprised of the hydrocodone dosing change. She rated her depression 3/10, hopelessness 3/10, and anxiety 3/10. She has been pleasant and cooperative today.   A: Meds given as ordered, including PRN hydrocodone with relief noted. Q15 safety checks maintained. Support/encouragement offered.  R: Pt remains free from harm and continues with treatment. Will continue to monitor for needs/safety.   Problem: Education: Goal: Knowledge of [facility] Education information/materials will improve Outcome: Progressing Goal: Emotional status will improve Outcome: Progressing Goal: Mental status will improve Outcome: Progressing Goal: Verbalization of understanding the information provided will improve Outcome: Progressing   Problem: Coping: Goal: Ability to verbalize frustrations and anger appropriately will improve Outcome: Progressing Goal: Ability to demonstrate self-control will improve Outcome: Adequate for Discharge   Problem: Physical Regulation: Goal: Ability to maintain clinical measurements within normal limits will improve Outcome: Adequate for Discharge   Problem: Physical Regulation: Goal: Ability to maintain clinical measurements within normal limits will improve Outcome: Adequate for Discharge   Problem: Safety: Goal: Periods of time without injury will increase Outcome: Adequate for Discharge   Problem: Education: Goal: Knowledge of disease or condition will improve Outcome: Progressing Goal: Understanding of discharge needs will improve Outcome: Progressing   Problem: Safety: Goal: Ability to remain free from injury will improve Outcome: Adequate for Discharge

## 2018-12-22 NOTE — Progress Notes (Addendum)
[facility] MD Progress Note  12/22/2018 9:44 AM [name]  MRN:  [id number]   Subjective: Patient reports today that she is feeling much better.  She denies any suicidal homicidal ideations and denies any hallucinations.  She reports that her appetite is been fair but she only eats small meals to begin with.  She reports her sleep is not good because of the pain in the side of her head from her subdural hematoma.  She reports that her biggest concern at the moment is that her pain medication was decreased.  She states that my problem was vodka not pills.  She denies any withdrawal symptoms and denies any medication side effects.  She feels that the Cymbalta has greatly assisted her and her improvement.  She states that she is hoping to get a bed at [facility] today.  Objective: Patient's chart and findings reviewed and discussed with treatment team.  Patient presents in the day room interacting with peers appropriately.  Patient is pleasant, calm, and cooperative.  Discussed and educated patient on her pain medications about why they were decreased and that she would not be allowed to have these medications when she goes to [facility].  States understanding, but was informed that she did not have to worry about her pain while she was at the hospital.  Principal Problem: MDD (major depressive disorder), recurrent severe, without psychosis (HCC) Diagnosis: Principal Problem:   MDD (major depressive disorder), recurrent severe, without psychosis (HCC)  Total Time spent with patient: 30 minutes  Past Psychiatric History: See H&P  Past Medical History:  Past Medical History:  Diagnosis Date  . Alcohol dependence (HCC)   . Anxiety   . Chronic pain   . Depression   . Herniated cervical disc     Past Surgical History:  Procedure Laterality Date  . ABDOMINAL HYSTERECTOMY    . ANKLE SURGERY Right   . CARPAL TUNNEL RELEASE Bilateral   . CERVICAL FUSION    . CHOLECYSTECTOMY    . KNEE SURGERY     Family  History: History reviewed. No pertinent family history. Family Psychiatric  History: See H&P Social History:  Social History   Substance and Sexual Activity  Alcohol Use Yes  . Alcohol/week: 3.0 standard drinks  . Types: 3 Standard drinks or equivalent per week   Comment: daily use.     Social History   Substance and Sexual Activity  Drug Use Yes  . Types: Marijuana    Social History   Socioeconomic History  . Marital status: Single    Spouse name: Not on file  . Number of children: Not on file  . Years of education: Not on file  . Highest education level: Not on file  Occupational History  . Not on file  Social Needs  . Financial resource strain: Somewhat hard  . Food insecurity:    Worry: Sometimes true    Inability: Sometimes true  . Transportation needs:    Medical: No    Non-medical: No  Tobacco Use  . Smoking status: Current Every Day Smoker    Packs/day: 0.50    Years: 7.00    Pack years: 3.50    Types: Cigarettes  . Smokeless tobacco: Never Used  Substance and Sexual Activity  . Alcohol use: Yes    Alcohol/week: 3.0 standard drinks    Types: 3 Standard drinks or equivalent per week    Comment: daily use.  . Drug use: Yes    Types: Marijuana  . Sexual  activity: Not Currently  Lifestyle  . Physical activity:    Days per week: Patient refused    Minutes per session: Patient refused  . Stress: Only a little  Relationships  . Social connections:    Talks on phone: Patient refused    Gets together: Patient refused    Attends religious service: Patient refused    Active member of club or organization: Patient refused    Attends meetings of clubs or organizations: Patient refused    Relationship status: Patient refused  Other Topics Concern  . Not on file  Social History Narrative   Pt lives in a [employer]; lives with boyfriend.   Additional Social History:                         Sleep: Fair  Appetite:  Fair  Current  Medications: Current Facility-Administered Medications  Medication Dose Route Frequency Provider Last Rate Last Dose  . alum & mag hydroxide-simeth (MAALOX/MYLANTA) 200-200-20 MG/5ML suspension 30 mL  30 mL Oral Q4H PRN Laveda Abbe, NP      . chlordiazePOXIDE (LIBRIUM) capsule 25 mg  25 mg Oral QID PRN Antonieta Pert, MD   25 mg at 12/22/18 1324  . DULoxetine (CYMBALTA) DR capsule 60 mg  60 mg Oral Daily Antonieta Pert, MD   60 mg at 12/22/18 0753  . gabapentin (NEURONTIN) capsule 400 mg  400 mg Oral TID Laveda Abbe, NP   400 mg at 12/22/18 0753  . HYDROcodone-acetaminophen (NORCO/VICODIN) 5-325 MG per tablet 1 tablet  1 tablet Oral Q6H PRN Money, [name], FNP      . hydrOXYzine (ATARAX/VISTARIL) tablet 25 mg  25 mg Oral TID PRN Laveda Abbe, NP   25 mg at 12/21/18 2119  . levETIRAcetam (KEPPRA) tablet 500 mg  500 mg Oral BID Laveda Abbe, NP   500 mg at 12/22/18 0753  . magnesium hydroxide (MILK OF MAGNESIA) suspension 30 mL  30 mL Oral Daily PRN Laveda Abbe, NP      . nicotine (NICODERM CQ - dosed in mg/24 hours) patch 21 mg  21 mg Transdermal Daily Laveda Abbe, NP   21 mg at 12/22/18 0754  . ondansetron (ZOFRAN-ODT) disintegrating tablet 4 mg  4 mg Oral Q8H PRN Laveda Abbe, NP   4 mg at 12/18/18 4010  . pantoprazole (PROTONIX) EC tablet 40 mg  40 mg Oral Daily Laveda Abbe, NP   40 mg at 12/22/18 0753  . thiamine (VITAMIN B-1) tablet 100 mg  100 mg Oral Daily Laveda Abbe, NP   100 mg at 12/22/18 2725   Or  . thiamine (B-1) injection 100 mg  100 mg Intravenous Daily Laveda Abbe, NP        Lab Results: No results found for this or any previous visit (from the past 48 hour(s)).  Blood Alcohol level:  Lab Results  Component Value Date   ETH <10 12/16/2018   ETH <10 12/11/2018    Metabolic Disorder Labs: Lab Results  Component Value Date   HGBA1C  10/23/2009    5.4 (NOTE) The ADA  recommends the following therapeutic goal for glycemic control related to Hgb A1c measurement: Goal of therapy: <6.5 Hgb A1c  Reference: American Diabetes Association: Clinical Practice Recommendations 2010, Diabetes Care, 2010, 33: (Suppl  1).   MPG 108 10/23/2009   No results found for: PROLACTIN Lab Results  Component Value Date  CHOL  10/23/2009    181        ATP III CLASSIFICATION:  <200     mg/dL   Desirable  117-356  mg/dL   Borderline High  >=701    mg/dL   High          TRIG 69 10/23/2009   HDL 88 10/23/2009   CHOLHDL 2.1 10/23/2009   VLDL 14 10/23/2009   LDLCALC  10/23/2009    79        Total Cholesterol/HDL:CHD Risk Coronary Heart Disease Risk Table                     Men   Women  1/2 Average Risk   3.4   3.3  Average Risk       5.0   4.4  2 X Average Risk   9.6   7.1  3 X Average Risk  23.4   11.0        Use the calculated Patient Ratio above and the CHD Risk Table to determine the patient's CHD Risk.        ATP III CLASSIFICATION (LDL):  <100     mg/dL   Optimal  410-301  mg/dL   Near or Above                    Optimal  130-159  mg/dL   Borderline  314-388  mg/dL   High  >875     mg/dL   Very High    Physical Findings: AIMS:  , ,  ,  ,    CIWA:  CIWA-Ar Total: 9 COWS:     Musculoskeletal: Strength & Muscle Tone: within normal limits Gait & Station: normal Patient leans: N/A  Psychiatric Specialty Exam: Physical Exam  Nursing note and vitals reviewed. Constitutional: She is oriented to person, place, and time. She appears well-developed and well-nourished.  Cardiovascular: Normal rate.  Respiratory: Effort normal.  Musculoskeletal: Normal range of motion.  Neurological: She is alert and oriented to person, place, and time.  Skin: Skin is warm.    Review of Systems  Constitutional: Negative.   HENT: Negative.   Eyes: Negative.   Respiratory: Negative.   Cardiovascular: Negative.   Gastrointestinal: Negative.   Genitourinary: Negative.    Musculoskeletal: Negative.   Skin: Negative.   Neurological: Negative.   Endo/Heme/Allergies: Negative.   Psychiatric/Behavioral: Positive for depression and substance abuse. Negative for hallucinations and suicidal ideas. The patient is nervous/anxious.     Blood pressure (!) 130/92, pulse 87, temperature (!) 97.5 F (36.4 C), temperature source Oral, resp. rate 16, height 5\' 1"  (1.549 m), weight 59.4 kg.Body mass index is 24.75 kg/m.  General Appearance: Casual  Eye Contact:  Good  Speech:  Clear and Coherent and Garbled  Volume:  Normal  Mood:  Anxious and Depressed  Affect:  Congruent  Thought Process:  Coherent and Descriptions of Associations: Intact  Orientation:  Full (Time, Place, and Person)  Thought Content:  WDL  Suicidal Thoughts:  No  Homicidal Thoughts:  No  Memory:  Immediate;   Good Recent;   Good Remote;   Good  Judgement:  Fair  Insight:  Good  Psychomotor Activity:  Normal  Concentration:  Concentration: Good and Attention Span: Good  Recall:  Good  Fund of Knowledge:  Good  Language:  Good  Akathisia:  No  Handed:  Right  AIMS (if indicated):     Assets:  Communication Skills Desire  for Improvement Financial Resources/Insurance Resilience Social Support Transportation  ADL's:  Intact  Cognition:  WNL  Sleep:  Number of Hours: 6.5   Problems addressed MDD severe recurrent without psychosis 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 Jama Flavors and decided to increase the frequency to every 6 hours as needed. Increase Vicodin 5-325 mg every 6 hours as needed for pain Continue Cymbalta 60 mg p.o. daily for anxiety and depression Continue Neurontin 400 mg p.o. 3 times daily for withdrawal symptoms Continue Librium 25 mg p.o.  4 times a day as needed for withdrawal symptoms Continue Vistaril 25 mg p.o. 3 times daily PRN for anxiety Continue Keppra 500 mg p.o. twice daily for seizure disorder Encourage group therapy participation Consult with CSW for placement at [facility] was informed that they will contact him back at 10:30 AM for bed availability  Maryfrances Bunnell, FNP 12/22/2018, 9:44 AM   Agree with NP progress note.

## 2018-12-22 NOTE — Progress Notes (Signed)
D: Patient observed up and visible on unit. Patient states, "My preference is to go to [facility] however I was in touch with an [address] tonight who says they have a bed. They were going to discuss my case tonight and I left the unit phone number and my code with them. I'm hoping they call tomorrow. I plan to do my 90 meetings in 90 days. It worked last time. I am intent on staying clean. I am lucky I didn't die when I fell and hit my head." Patient's affect anxious with congruent mood. Pleasant and cooperative.  Headache presently at a 5/10 after patient received pain medication shortly before shift change. No other physical complaints.   A: Medicated per orders, prn vistaril given for anxiety and in hopes it will help her sleep. Medication education provided. Level III obs in place for safety. Emotional support offered. Patient encouraged to complete Suicide Safety Plan before discharge. Encouraged to attend and participate in unit programming. Encouraged to speak with SW tomorrow and this Clinical research associate will pass to day RN information patient provided this evening.   R: Patient verbalizes understanding of POC. On reassess, patient is resting in bed. Patient denies SI/HI/AVH and remains safe on level III obs. Will continue to monitor throughout the night.

## 2018-12-22 NOTE — [facility] Group Notes (Signed)
[facility] LCSW Group Therapy Note  Date/Time 12/22/2018 1:30 PM  Type of Therapy/Topic:  Group Therapy:  Feelings about Diagnosis  Participation Level:  Active   Mood: Exasperated  Description of Group:    This group will allow patients to explore their thoughts and feelings about diagnoses they have received. Patients will be guided to explore their level of understanding and acceptance of these diagnoses. Facilitator will encourage patients to process their thoughts and feelings about the reactions of others to their diagnosis, and will guide patients in identifying ways to discuss their diagnosis with significant others in their lives. This group will be process-oriented, with patients participating in exploration of their own experiences as well as giving and receiving support and challenge from other group members.   Therapeutic Goals: 1. Patient will demonstrate understanding of diagnosis as evidence by identifying two or more symptoms of the disorder:  2. Patient will be able to express two feelings regarding the diagnosis 3. Patient will demonstrate ability to communicate their needs through discussion and/or role plays  Summary of Patient Progress: [name] attended the entire group. She stated that she has been feeling exasperated with her Alcohol Use Disorder because she does not want to have this disease. [name] would like to be able to communicate in a more positive manner with other people.   Therapeutic Modalities:   Cognitive Behavioral Therapy Brief Therapy Feelings Identification    Marian Sorrow, MSW Intern 12/22/2018 4:25 PM

## 2018-12-22 NOTE — Progress Notes (Addendum)
CSW spoke with patient on unit. Patient is waiting to go to [facility], she has been accepted and is waiting for an open bed. Per Amy at [facility], there were no discharges today and the earliest patient would be able to come would be Monday. This is contingent upon weekend discharges.   Patient reports anxiety and depression. Her daughter is reportedly having surgery to remove a cyst on her pancrease today at [facility]. Patient reports she will call her daughter after surgery, around 6pm and she is hoping for the best. She reports she has prayed and written in her journal and remains hopeful.   CSW spoke with patient regarding [facility] update. Patient understanding and hopeful she can go directly to treatment. Expresses anxiety and depression but hope for entering treatment and remaining sober. She reports she went to [facility] years ago and maintained 3 years of sobriety afterward.  CSW asked patient to start calling [facility] as a back up plan. CSW will follow up with [facility] again tomorrow.   [name], LCSW-A Clinical Social Worker

## 2018-12-23 MED ORDER — ACAMPROSATE CALCIUM 333 MG PO TBEC
666.0000 mg | DELAYED_RELEASE_TABLET | Freq: Three times a day (TID) | ORAL | Status: DC
  Administered 2018-12-23 – 2018-12-27 (×11): 666 mg via ORAL
  Filled 2018-12-23 (×19): qty 2
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
## 2018-12-23 NOTE — Progress Notes (Signed)
CSW met with patient on unit. Patient expressed situational SI and fear of relapsing if discharged due to loneliness. Patient reports she is afraid to relapse on alcohol and fears she may fall or injure herself fatally.   She reports additional anxiety relating to her adult daughter being medically hospitalized at [facility]. She hopes to hear from her daughter this evening.  She is forthcoming, pleasant, and cooperative with treatment.  Stephanie Acre, LCSW-A Clinical Social Worker

## 2018-12-23 NOTE — Tx Team (Signed)
Interdisciplinary Treatment and Diagnostic Plan Update  12/23/2018 Time of Session: 9:00am [name] MRN: [id number]  Principal Diagnosis: MDD (major depressive disorder), recurrent severe, without psychosis (HCC)  Secondary Diagnoses: Principal Problem:   MDD (major depressive disorder), recurrent severe, without psychosis (HCC) Active Problems:   Alcohol dependence with alcohol-induced mood disorder (HCC)   Subarachnoid hemorrhage (HCC)   Current Medications:  Current Facility-Administered Medications  Medication Dose Route Frequency Provider Last Rate Last Dose  . alum & mag hydroxide-simeth (MAALOX/MYLANTA) 200-200-20 MG/5ML suspension 30 mL  30 mL Oral Q4H PRN Laveda Abbe, NP      . chlordiazePOXIDE (LIBRIUM) capsule 25 mg  25 mg Oral QID PRN Antonieta Pert, MD   25 mg at 12/22/18 1191  . DULoxetine (CYMBALTA) DR capsule 60 mg  60 mg Oral Daily Antonieta Pert, MD   60 mg at 12/23/18 0753  . gabapentin (NEURONTIN) capsule 400 mg  400 mg Oral TID Laveda Abbe, NP   400 mg at 12/23/18 0753  . HYDROcodone-acetaminophen (NORCO/VICODIN) 5-325 MG per tablet 1 tablet  1 tablet Oral Q6H PRN Money, [name], FNP   1 tablet at 12/23/18 0537  . hydrOXYzine (ATARAX/VISTARIL) tablet 25 mg  25 mg Oral TID PRN Laveda Abbe, NP   25 mg at 12/22/18 2131  . levETIRAcetam (KEPPRA) tablet 500 mg  500 mg Oral BID Laveda Abbe, NP   500 mg at 12/23/18 0754  . magnesium hydroxide (MILK OF MAGNESIA) suspension 30 mL  30 mL Oral Daily PRN Laveda Abbe, NP      . nicotine (NICODERM CQ - dosed in mg/24 hours) patch 21 mg  21 mg Transdermal Daily Laveda Abbe, NP   21 mg at 12/23/18 0753  . ondansetron (ZOFRAN-ODT) disintegrating tablet 4 mg  4 mg Oral Q8H PRN Laveda Abbe, NP   4 mg at 12/18/18 4782  . pantoprazole (PROTONIX) EC tablet 40 mg  40 mg Oral Daily Laveda Abbe, NP   40 mg at 12/23/18 0753  . thiamine (VITAMIN B-1)  tablet 100 mg  100 mg Oral Daily Laveda Abbe, NP   100 mg at 12/23/18 9562   Or  . thiamine (B-1) injection 100 mg  100 mg Intravenous Daily Laveda Abbe, NP       PTA Medications: Medications Prior to Admission  Medication Sig Dispense Refill Last Dose  . DULoxetine (CYMBALTA) 30 MG capsule Take 1 capsule (30 mg total) by mouth daily. For depression (Patient not taking: Reported on 12/10/2018) 30 capsule 0 Not Taking at Unknown time  . folic acid (FOLVITE) 1 MG tablet Take 1 tablet (1 mg total) by mouth daily for 30 days. 30 tablet 0   . gabapentin (NEURONTIN) 400 MG capsule Take 1 capsule (400 mg total) by mouth 3 (three) times daily. For agitation (Patient not taking: Reported on 12/10/2018) 90 capsule 0 Not Taking at Unknown time  . hydrOXYzine (ATARAX/VISTARIL) 25 MG tablet Take 1 tablet (25 mg total) by mouth every 6 (six) hours as needed. For anxiety (Patient not taking: Reported on 12/10/2018) 60 tablet 0 Not Taking at Unknown time  . levETIRAcetam (KEPPRA) 500 MG tablet Take 1 tablet (500 mg total) by mouth 2 (two) times daily for 30 days. 60 tablet 0 12/15/2018  . Multiple Vitamin (MULTIVITAMIN WITH MINERALS) TABS tablet Take 1 tablet by mouth daily for 30 days. 30 tablet 0 unknown  . nicotine (NICODERM CQ - DOSED IN MG/24 HOURS)  21 mg/24hr patch Place 1 patch (21 mg total) onto the skin daily. (May buy from over the counter): Smoking cessation (Patient not taking: Reported on 12/10/2018) 1 patch 0 Not Taking at Unknown time  . ondansetron (ZOFRAN ODT) 4 MG disintegrating tablet Take 1 tablet (4 mg total) by mouth every 8 (eight) hours as needed for nausea or vomiting. 15 tablet 0 Past Week at Unknown time  . pantoprazole (PROTONIX) 20 MG tablet Take 1 tablet (20 mg total) by mouth daily. For acid reflux (Patient not taking: Reported on 12/10/2018) 30 tablet 1 Not Taking at Unknown time  . thiamine 100 MG tablet Take 1 tablet (100 mg total) by mouth daily for 30 days. 30 tablet  0     Patient Stressors: Financial difficulties Marital or family conflict Substance abuse  Patient Strengths: Capable of independent living Barrister's clerk for treatment/growth  Treatment Modalities: Medication Management, Group therapy, Case management,  1 to 1 session with clinician, Psychoeducation, Recreational therapy.   Physician Treatment Plan for Primary Diagnosis: MDD (major depressive disorder), recurrent severe, without psychosis (HCC) Long Term Goal(s): Improvement in symptoms so as ready for discharge Improvement in symptoms so as ready for discharge   Short Term Goals: Ability to identify changes in lifestyle to reduce recurrence of condition will improve Ability to verbalize feelings will improve Ability to disclose and discuss suicidal ideas Ability to demonstrate self-control will improve Ability to identify and develop effective coping behaviors will improve Ability to maintain clinical measurements within normal limits will improve Ability to identify triggers associated with substance abuse/mental health issues will improve Ability to identify changes in lifestyle to reduce recurrence of condition will improve Ability to verbalize feelings will improve Ability to disclose and discuss suicidal ideas Ability to demonstrate self-control will improve Ability to identify and develop effective coping behaviors will improve Ability to maintain clinical measurements within normal limits will improve Ability to identify triggers associated with substance abuse/mental health issues will improve  Medication Management: Evaluate patient's response, side effects, and tolerance of medication regimen.  Therapeutic Interventions: 1 to 1 sessions, Unit Group sessions and Medication administration.  Evaluation of Outcomes: Progressing  Physician Treatment Plan for Secondary Diagnosis: Principal Problem:   MDD (major depressive disorder), recurrent severe,  without psychosis (HCC) Active Problems:   Alcohol dependence with alcohol-induced mood disorder (HCC)   Subarachnoid hemorrhage (HCC)  Long Term Goal(s): Improvement in symptoms so as ready for discharge Improvement in symptoms so as ready for discharge   Short Term Goals: Ability to identify changes in lifestyle to reduce recurrence of condition will improve Ability to verbalize feelings will improve Ability to disclose and discuss suicidal ideas Ability to demonstrate self-control will improve Ability to identify and develop effective coping behaviors will improve Ability to maintain clinical measurements within normal limits will improve Ability to identify triggers associated with substance abuse/mental health issues will improve Ability to identify changes in lifestyle to reduce recurrence of condition will improve Ability to verbalize feelings will improve Ability to disclose and discuss suicidal ideas Ability to demonstrate self-control will improve Ability to identify and develop effective coping behaviors will improve Ability to maintain clinical measurements within normal limits will improve Ability to identify triggers associated with substance abuse/mental health issues will improve     Medication Management: Evaluate patient's response, side effects, and tolerance of medication regimen.  Therapeutic Interventions: 1 to 1 sessions, Unit Group sessions and Medication administration.  Evaluation of Outcomes: Progressing   RN Treatment Plan for  Primary Diagnosis: MDD (major depressive disorder), recurrent severe, without psychosis (HCC) Long Term Goal(s): Knowledge of disease and therapeutic regimen to maintain health will improve  Short Term Goals: Ability to demonstrate self-control, Ability to participate in decision making will improve and Ability to identify and develop effective coping behaviors will improve  Medication Management: RN will administer medications as  ordered by provider, will assess and evaluate patient's response and provide education to patient for prescribed medication. RN will report any adverse and/or side effects to prescribing provider.  Therapeutic Interventions: 1 on 1 counseling sessions, Psychoeducation, Medication administration, Evaluate responses to treatment, Monitor vital signs and CBGs as ordered, Perform/monitor CIWA, COWS, AIMS and Fall Risk screenings as ordered, Perform wound care treatments as ordered.  Evaluation of Outcomes: Progressing   LCSW Treatment Plan for Primary Diagnosis: MDD (major depressive disorder), recurrent severe, without psychosis (HCC) Long Term Goal(s): Safe transition to appropriate next level of care at discharge, Engage patient in therapeutic group addressing interpersonal concerns.  Short Term Goals: Engage patient in aftercare planning with referrals and resources, Increase social support, Increase emotional regulation, Identify triggers associated with mental health/substance abuse issues and Increase skills for wellness and recovery  Therapeutic Interventions: Assess for all discharge needs, 1 to 1 time with Social worker, Explore available resources and support systems, Assess for adequacy in community support network, Educate family and significant other(s) on suicide prevention, Complete Psychosocial Assessment, Interpersonal group therapy.  Evaluation of Outcomes: Progressing   Progress in Treatment: Attending groups: Yes. Participating in groups: Yes. Taking medication as prescribed: Yes. Toleration medication: Yes. Family/Significant other contact made: No, will contact:  completed with patient. Patient understands diagnosis: Yes. Discussing patient identified problems/goals with staff: Yes. Medical problems stabilized or resolved: Yes. Denies suicidal/homicidal ideation: Yes. Issues/concerns per patient self-inventory: Yes.  New problem(s) identified: Yes, Describe:  Patient  is homeless. She is requesting residential treatment. She has court dates later this month, so [facility] may be her best option.  New Short Term/Long Term Goal(s): detox, medication management for mood stabilization; elimination of SI thoughts; development of comprehensive mental wellness/sobriety plan.  Patient Goals:  "Stay sober, or I will die." Wants residential treatment.  Discharge Plan or Barriers: [facility] referral, [facility] with outpatient follow up as a back up.  Reason for Continuation of Hospitalization: Anxiety Depression Withdrawal symptoms  Estimated Length of Stay: Monday  Attendees: Patient: [name] 12/23/2018 8:33 AM  Physician: Marguerita Merles 12/23/2018 8:33 AM  Nursing: Casimiro Needle RN 12/23/2018 8:33 AM  RN Care Manager: 12/23/2018 8:33 AM  Social Worker: [name], Theresia Majors 12/23/2018 8:33 AM  Recreational Therapist:  12/23/2018 8:33 AM  Other:  12/23/2018 8:33 AM  Other:  12/23/2018 8:33 AM  Other: 12/23/2018 8:33 AM    Scribe for Treatment Team: Darreld Mclean, LCSWA 12/23/2018 8:33 AM

## 2018-12-23 NOTE — Progress Notes (Signed)
CSW spoke with [name] in admissions at [facility]. There were no discharges yesterday. Admissions reports that if a patient discharges over the weekend, they will call CSW. Patient would not be able to come to [facility] earlier than Tuesday (03/10).  [name], LCSW-A Clinical Social Worker

## 2018-12-23 NOTE — Plan of Care (Signed)
  Problem: Safety: Goal: Ability to remain free from injury will improve Outcome: Progressing   Problem: Education: Goal: Utilization of techniques to improve thought processes will improve Outcome: Progressing Goal: Knowledge of the prescribed therapeutic regimen will improve Outcome: Progressing   Problem: Activity: Goal: Interest or engagement in leisure activities will improve Outcome: Progressing Goal: Imbalance in normal sleep/wake cycle will improve Outcome: Progressing   

## 2018-12-23 NOTE — Progress Notes (Signed)
[facility] MD Progress Note  12/23/2018 2:55 PM [name]  MRN:  [id number] Subjective: Reports gradual/partial improvement since admission.  Continues to describe significant headache stemming from recent head trauma.  Currently denies suicidal ideations and is future oriented, expressing high level of motivation in going to a rehab at discharge or also willing to consider going to an [facility] setting. States she feels she is at high risk of relapsing if returning to the community at this time, and states" I know if I go back to drinking I would likely die".  Currently does not endorse medication side effects.  Objective: I have discussed case with treatment team and have met with patient 63 year old female, history of alcohol dependence, recent seizure, felt to be related to withdrawal- sustained head injury/subarachnoid hemorrhage on 2/22 following a witnessed seizure.  Describes history of depression/ grief symptoms related to death of loved ones.  As above, patient describes partially improved mood although does remain vaguely depressed and constricted, briefly tearful when discussing losses/loss of loved ones.  Denies suicidal ideations and is future oriented and focusing on disposition options.  Describes subjective lingering symptoms of withdrawal such as feeling vaguely jittery but does not appear to be in any acute distress or discomfort and vitals are stable.. Currently does not endorse medication side effects.  As above, expresses significant concern regarding potential for relapse should she return to the community at this time, and a high level of interest in going to a sober/safe/structured setting No disruptive or agitated behaviors on unit, polite on approach.  Principal Problem: MDD (major depressive disorder), recurrent severe, without psychosis ([place]) Diagnosis: Principal Problem:   MDD (major depressive disorder), recurrent severe, without psychosis ([place]) Active Problems:    Alcohol dependence with alcohol-induced mood disorder ([place])   Subarachnoid hemorrhage ([place])  Total Time spent with patient: 20 minutes  Past Psychiatric History: See admission H&P  Past Medical History:  Past Medical History:  Diagnosis Date  . Alcohol dependence ([place])   . Anxiety   . Chronic pain   . Depression   . Herniated cervical disc     Past Surgical History:  Procedure Laterality Date  . ABDOMINAL HYSTERECTOMY    . ANKLE SURGERY Right   . CARPAL TUNNEL RELEASE Bilateral   . CERVICAL FUSION    . CHOLECYSTECTOMY    . KNEE SURGERY     Family History: History reviewed. No pertinent family history. Family Psychiatric  History: See admission H&P Social History:  Social History   Substance and Sexual Activity  Alcohol Use Yes  . Alcohol/week: 3.0 standard drinks  . Types: 3 Standard drinks or equivalent per week   Comment: daily use.     Social History   Substance and Sexual Activity  Drug Use Yes  . Types: Marijuana    Social History   Socioeconomic History  . Marital status: Single    Spouse name: Not on file  . Number of children: Not on file  . Years of education: Not on file  . Highest education level: Not on file  Occupational History  . Not on file  Social Needs  . Financial resource strain: Somewhat hard  . Food insecurity:    Worry: Sometimes true    Inability: Sometimes true  . Transportation needs:    Medical: No    Non-medical: No  Tobacco Use  . Smoking status: Current Every Day Smoker    Packs/day: 0.50    Years: 7.00    Pack years: 3.50  Types: Cigarettes  . Smokeless tobacco: Never Used  Substance and Sexual Activity  . Alcohol use: Yes    Alcohol/week: 3.0 standard drinks    Types: 3 Standard drinks or equivalent per week    Comment: daily use.  . Drug use: Yes    Types: Marijuana  . Sexual activity: Not Currently  Lifestyle  . Physical activity:    Days per week: Patient refused    Minutes per session: Patient refused   . Stress: Only a little  Relationships  . Social connections:    Talks on phone: Patient refused    Gets together: Patient refused    Attends religious service: Patient refused    Active member of club or organization: Patient refused    Attends meetings of clubs or organizations: Patient refused    Relationship status: Patient refused  Other Topics Concern  . Not on file  Social History Narrative   Pt lives in a [address]; lives with boyfriend.   Additional Social History:         Sleep: Improving  Appetite:  Improving  Current Medications: Current Facility-Administered Medications  Medication Dose Route Frequency Provider Last Rate Last Dose  . alum & mag hydroxide-simeth (MAALOX/MYLANTA) 200-200-20 MG/5ML suspension 30 mL  30 mL Oral Q4H PRN Ethelene Hal, NP      . chlordiazePOXIDE (LIBRIUM) capsule 25 mg  25 mg Oral QID PRN Sharma Covert, MD   25 mg at 12/23/18 1210  . DULoxetine (CYMBALTA) DR capsule 60 mg  60 mg Oral Daily Sharma Covert, MD   60 mg at 12/23/18 0753  . gabapentin (NEURONTIN) capsule 400 mg  400 mg Oral TID Ethelene Hal, NP   400 mg at 12/23/18 1210  . HYDROcodone-acetaminophen (NORCO/VICODIN) 5-325 MG per tablet 1 tablet  1 tablet Oral Q6H PRN Money, Lowry Ram, FNP   1 tablet at 12/23/18 1132  . hydrOXYzine (ATARAX/VISTARIL) tablet 25 mg  25 mg Oral TID PRN Ethelene Hal, NP   25 mg at 12/22/18 2131  . levETIRAcetam (KEPPRA) tablet 500 mg  500 mg Oral BID Ethelene Hal, NP   500 mg at 12/23/18 0754  . magnesium hydroxide (MILK OF MAGNESIA) suspension 30 mL  30 mL Oral Daily PRN Ethelene Hal, NP      . nicotine (NICODERM CQ - dosed in mg/24 hours) patch 21 mg  21 mg Transdermal Daily Ethelene Hal, NP   21 mg at 12/23/18 0753  . ondansetron (ZOFRAN-ODT) disintegrating tablet 4 mg  4 mg Oral Q8H PRN Ethelene Hal, NP   4 mg at 12/18/18 5809  . pantoprazole (PROTONIX) EC tablet 40 mg  40 mg Oral  Daily Ethelene Hal, NP   40 mg at 12/23/18 0753  . thiamine (VITAMIN B-1) tablet 100 mg  100 mg Oral Daily Ethelene Hal, NP   100 mg at 12/23/18 9833   Or  . thiamine (B-1) injection 100 mg  100 mg Intravenous Daily Ethelene Hal, NP        Lab Results:  No results found for this or any previous visit (from the past 48 hour(s)).  Blood Alcohol level:  Lab Results  Component Value Date   ETH <10 12/16/2018   ETH <10 82/50/5397    Metabolic Disorder Labs: Lab Results  Component Value Date   HGBA1C  10/23/2009    5.4 (NOTE) The ADA recommends the following therapeutic goal for glycemic control related to  Hgb A1c measurement: Goal of therapy: <6.5 Hgb A1c  Reference: American Diabetes Association: Clinical Practice Recommendations 2010, Diabetes Care, 2010, 33: (Suppl  1).   MPG 108 10/23/2009   No results found for: PROLACTIN Lab Results  Component Value Date   CHOL  10/23/2009    181        ATP III CLASSIFICATION:  <200     mg/dL   Desirable  200-239  mg/dL   Borderline High  >=240    mg/dL   High          TRIG 69 10/23/2009   HDL 88 10/23/2009   CHOLHDL 2.1 10/23/2009   VLDL 14 10/23/2009   LDLCALC  10/23/2009    79        Total Cholesterol/HDL:CHD Risk Coronary Heart Disease Risk Table                     Men   Women  1/2 Average Risk   3.4   3.3  Average Risk       5.0   4.4  2 X Average Risk   9.6   7.1  3 X Average Risk  23.4   11.0        Use the calculated Patient Ratio above and the CHD Risk Table to determine the patient's CHD Risk.        ATP III CLASSIFICATION (LDL):  <100     mg/dL   Optimal  100-129  mg/dL   Near or Above                    Optimal  130-159  mg/dL   Borderline  160-189  mg/dL   High  >190     mg/dL   Very High    Physical Findings: AIMS:  , ,  ,  ,    CIWA:  CIWA-Ar Total: 9 COWS:     Musculoskeletal: Strength & Muscle Tone: within normal limits-no significant psychomotor agitation or  restlessness noted, no distal tremors noted. Gait & Station: normal Patient leans: N/A  Psychiatric Specialty Exam: Physical Exam  Nursing note and vitals reviewed. Constitutional: She is oriented to person, place, and time. She appears well-developed and well-nourished.  Cardiovascular: Normal rate.  Respiratory: Effort normal.  Neurological: She is alert and oriented to person, place, and time.    Review of Systems  Constitutional: Negative.   Respiratory: Negative for cough and shortness of breath.   Cardiovascular: Negative for chest pain.  Gastrointestinal: Negative for nausea and vomiting.  Musculoskeletal: Positive for myalgias.  Neurological: Positive for headaches.  Psychiatric/Behavioral: Positive for depression and substance abuse (ETOH). Negative for hallucinations, memory loss and suicidal ideas. The patient is nervous/anxious. The patient does not have insomnia.   Reports headache stemming from recent head injury.  No chest pain, no shortness of breath, no vomiting.  Blood pressure 128/89, pulse 76, temperature 98.1 F (36.7 C), resp. rate 18, height 5' 1"  (1.549 m), weight 59.4 kg.Body mass index is 24.75 kg/m.  General Appearance: Improving grooming  Eye Contact:  Good  Speech:  Normal Rate  Volume:  Normal  Mood:  Less depressed than on admission, partially improved mood  Affect:  Anxious, vaguely constricted, overall more reactive, smiles at times appropriately during session  Thought Process:  Linear and Descriptions of Associations: Intact  Orientation:  Other:  Fully alert and attentive  Thought Content:  No hallucinations, no delusions  Suicidal Thoughts:  No today denies  suicidal or self-injurious ideations, contracts for safety on unit, does not endorse any homicidal or violent ideations  Homicidal Thoughts:  No  Memory:  Recent and remote grossly intact  Judgement:  Other:  Improving  Insight:  Fair and Improving  Psychomotor Activity:  Normal-no  psychomotor restlessness or agitation  Concentration:  Concentration: Good and Attention Span: Good  Recall:  Good  Fund of Knowledge:  Good  Language:  Good  Akathisia:  No  Handed:  Right  AIMS (if indicated):     Assets:  Communication Skills Desire for Improvement Leisure Time Resilience  ADL's:  Intact  Cognition:  WNL  Sleep:  Number of Hours: 7   Assessment:  63 year old female, history of alcohol dependence, recent seizure, felt to be related to withdrawal.  Recent head injury/subarachnoid hemorrhage on 2/22 following a witnessed seizure.  Describes history of depression/ grief symptoms related to death of loved ones.   Patient is presenting with partially improved mood and range of affect, although remains vaguely anxious and constricted.  She currently denies suicidal ideations, and is future oriented, focused mainly on ensuring that she is discharged to a safe/sober/structured setting such as a rehab or possibly an [employer]. She describes concerns regarding potential for relapse, and fear of medical/health consequences should this occur.  I have discussed case with treatment team and with CSW.  Patient has been tentatively accepted into [facility] rehab setting, but no beds are available as of today yet.  Currently denies medication side effects.  Agrees to Campral trial for alcohol dependence/potential cravings.  Side effects reviewed.  Treatment Plan Summary: Daily contact with patient to assess and evaluate symptoms and progress in treatment and Medication management  Treatment plan reviewed as below today 3/6 Encourage efforts to work on sobriety and relapse prevention Encourage group and milieu participation to work on Radiographer, therapeutic and symptom reduction Treatment team working on disposition planning options-as above interested in going to [facility]  Continue Cymbalta 60 mg PO daily for mood/anxiety Continue Vistaril 25 mg PO TID PRN anxiety Continue Gabapentin  400 mg PO TID for pain Continue Librium 25 mg PO QID PRN CIWA>10 Continue Norco  5-325 mg PO Q8HR PRN pain Continue Keppra 500 mg PO BID for seizures Continue Protonix 40 mg PO daily for GERD Continue Thiamine 100 mg PO daily for supplementation Start Campral 666 mgrs TID for alcohol dependence   Jenne Campus, MD 12/23/2018, 2:55 PM   Patient ID: [name], female   DOB: 02-18-1956, 63 y.o.   MRN: [id number]

## 2018-12-23 NOTE — Progress Notes (Signed)
D: Pt was in dayroom upon initial approach.  Pt presents with anxious affect and mood.  She reports her day was "pretty good" and her goal is "trying to get into [facility]."  Pt reports "hopefully Monday something will happen."  Pt denies SI/HI, denies hallucinations, reports pain from headache of 5/10.  Pt has been visible in milieu interacting with peers and staff appropriately.  Pt attended evening group.    A: Introduced self to pt.  Met with pt 1:1.  Actively listened to pt and offered support and encouragement.  PRN medication administered for anxiety.  Q15 minute safety checks maintained.  R: Pt is safe on the unit.  Pt is compliant with medication.  Pt verbally contracts for safety.  Will continue to monitor and assess.

## 2018-12-23 NOTE — Progress Notes (Signed)
D Patient is observed by this writer- OOB UAL On the 300 hall today. SHe tolerates this well. She meets Clinical research associate in cafe' during breakfast and speaks to Clinical research associate appropriately, she remains depressed, flat and sad. She sqays ' Im feeling so much better.... Im so glad I came in the hospital".     A She cont to c/o "bad" pain at her head ( where she fell and hit her head prior to admission. She completed her daily assessment and on this she wrote she deneid SI today and she rated her depression, hopelessness and anxeity " 5/5/4", respectively. She is planning to be discharged at the beginning of next week.     R Safety in place.

## 2018-12-24 ENCOUNTER — Encounter (HOSPITAL_COMMUNITY): Payer: Self-pay | Admitting: Behavioral Health

## 2018-12-24 MED ORDER — HYDROXYZINE HCL 50 MG PO TABS
50.0000 mg | ORAL_TABLET | Freq: Every evening | ORAL | Status: DC | PRN
  Administered 2018-12-24 – 2018-12-26 (×3): 50 mg via ORAL
  Filled 2018-12-24 (×3): qty 1

## 2018-12-24 MED ORDER — HYDROXYZINE HCL 25 MG PO TABS: 25.0000 mg | ORAL_TABLET | Freq: Two times a day (BID) | ORAL | Status: DC | PRN

## 2018-12-24 NOTE — Progress Notes (Signed)
D: Pt was in dayroom upon initial approach.  Pt presents with anxious affect and appropriate mood.  She reports her day has been "all right."  Her goal is to "listen to NA if they come."  She reports the best part of her day was "the 2 meetings we had, they were both good."  Pt denies SI/HI, denies hallucinations.  Pt has been visible in milieu interacting with peers and staff appropriately.  Pt attended evening group.    A: Met with pt 1:1.  Actively listened to pt and offered support and encouragement.  PRN medication administered for sleep.  Q15 minute safety checks maintained.  R: Pt is safe on the unit.  Pt is compliant with medication.  Pt verbally contracts for safety.  Will continue to monitor and assess.

## 2018-12-24 NOTE — Progress Notes (Signed)
[facility]  12/24/2018 8:46 AM Emily FlakeDarlene Harrison  MRN:  [id number]  Subjective: Patient states, " I feel better. I really have high hopes of going to treatment after I leave and getting better. I am very motivated this time and I do not want to die."    Objective: Face to face evaluation completed, case discussed with treatment team and chart reviewed. In brief; this is a 63 year old female, history of alcohol dependence, recent seizure, felt to be related to withdrawal- sustained head injury/subarachnoid hemorrhage on 2/22 following a witnessed seizure.  Describes history of depression/ grief symptoms related to death of loved ones.     Today, patient is alert and oriented x3, calm and cooperative. She describes mood as improved, feeling l;ess depressed and less anxious. She enies suicidal ideations, psychotic symptoms, or homicidal thoughts. We spent an extensive amount of time talking about her after care plans and she remains fut re oriented and motivated to receive treatment for her alcohol abuse. She has expressed her goal is to go to [facility] following discharge and LCSW continues to work on this plan. She reports some interruptions in sleep despite taking Vistaril 25 mg as needed. We discussed other treatment options and she reports she had tried Trazodone and Seroquel in the past which were not effective. We discussed increasing Vistaril and she agreed. She denies concerns with appetite. Reports medications are tolerated well without side effects. She is active for all unit activities presenting without severe emotional instability. She is negative for withdrawal symptoms. There are no disruptive or agitated behaviors on unit.      Principal Problem: MDD (major depressive disorder), recurrent severe, without psychosis (HCC) Diagnosis: Principal Problem:   MDD (major depressive disorder), recurrent severe, without psychosis (HCC) Active Problems:   Alcohol dependence with  alcohol-induced mood disorder (HCC)   Subarachnoid hemorrhage (HCC)  Total Time spent with patient: 20 minutes  Past Psychiatric History: See admission H&P  Past Medical History:  Past Medical History:  Diagnosis Date  . Alcohol dependence (HCC)   . Anxiety   . Chronic pain   . Depression   . Herniated cervical disc     Past Surgical History:  Procedure Laterality Date  . ABDOMINAL HYSTERECTOMY    . ANKLE SURGERY Right   . CARPAL TUNNEL RELEASE Bilateral   . CERVICAL FUSION    . CHOLECYSTECTOMY    . KNEE SURGERY     Family History: History reviewed. No pertinent family history. Family Psychiatric  History: See admission H&P Social History:  Social History   Substance and Sexual Activity  Alcohol Use Yes  . Alcohol/week: 3.0 standard drinks  . Types: 3 Standard drinks or equivalent per week   Comment: daily use.     Social History   Substance and Sexual Activity  Drug Use Yes  . Types: Marijuana    Social History   Socioeconomic History  . Marital status: Single    Spouse name: Not on file  . Number of children: Not on file  . Years of education: Not on file  . Highest education level: Not on file  Occupational History  . Not on file  Social Needs  . Financial resource strain: Somewhat hard  . Food insecurity:    Worry: Sometimes true    Inability: Sometimes true  . Transportation needs:    Medical: No    Non-medical: No  Tobacco Use  . Smoking status: Current Every Day Smoker    Packs/day: 0.50  Years: 7.00    Pack years: 3.50    Types: Cigarettes  . Smokeless tobacco: Never Used  Substance and Sexual Activity  . Alcohol use: Yes    Alcohol/week: 3.0 standard drinks    Types: 3 Standard drinks or equivalent per week    Comment: daily use.  . Drug use: Yes    Types: Marijuana  . Sexual activity: Not Currently  Lifestyle  . Physical activity:    Days per week: Patient refused    Minutes per session: Patient refused  . Stress: Only a  little  Relationships  . Social connections:    Talks on phone: Patient refused    Gets together: Patient refused    Attends religious service: Patient refused    Active member of club or organization: Patient refused    Attends meetings of clubs or organizations: Patient refused    Relationship status: Patient refused  Other Topics Concern  . Not on file  Social History Narrative   Pt lives in a [employer] [employer]; lives with boyfriend.   Additional Social History:         Sleep: Improving  Appetite:  Improving  Current Medications: Current Facility-Administered Medications  Medication Dose Route Frequency Provider Last Rate Last Dose  . acamprosate (CAMPRAL) tablet 666 mg  666 mg Oral TID WC Cobos, Rockey Situ, MD   666 mg at 12/24/18 0612  . alum & mag hydroxide-simeth (MAALOX/MYLANTA) 200-200-20 MG/5ML suspension 30 mL  30 mL Oral Q4H PRN Laveda Abbe, NP      . chlordiazePOXIDE (LIBRIUM) capsule 25 mg  25 mg Oral QID PRN Antonieta Pert, MD   25 mg at 12/23/18 1210  . DULoxetine (CYMBALTA) DR capsule 60 mg  60 mg Oral Daily Antonieta Pert, MD   60 mg at 12/24/18 0743  . gabapentin (NEURONTIN) capsule 400 mg  400 mg Oral TID Laveda Abbe, NP   400 mg at 12/24/18 0743  . HYDROcodone-acetaminophen (NORCO/VICODIN) 5-325 MG per tablet 1 tablet  1 tablet Oral Q6H PRN Money, [name], FNP   1 tablet at 12/24/18 1610  . hydrOXYzine (ATARAX/VISTARIL) tablet 25 mg  25 mg Oral BID PRN Denzil Magnuson, NP      . hydrOXYzine (ATARAX/VISTARIL) tablet 50 mg  50 mg Oral QHS PRN Denzil Magnuson, NP      . levETIRAcetam (KEPPRA) tablet 500 mg  500 mg Oral BID Laveda Abbe, NP   500 mg at 12/24/18 0743  . magnesium hydroxide (MILK OF MAGNESIA) suspension 30 mL  30 mL Oral Daily PRN Laveda Abbe, NP      . nicotine (NICODERM CQ - dosed in mg/24 hours) patch 21 mg  21 mg Transdermal Daily Laveda Abbe, NP   21 mg at 12/24/18 0743  . ondansetron  (ZOFRAN-ODT) disintegrating tablet 4 mg  4 mg Oral Q8H PRN Laveda Abbe, NP   4 mg at 12/18/18 9604  . pantoprazole (PROTONIX) EC tablet 40 mg  40 mg Oral Daily Laveda Abbe, NP   40 mg at 12/24/18 5409  . thiamine (VITAMIN B-1) tablet 100 mg  100 mg Oral Daily Laveda Abbe, NP   100 mg at 12/24/18 8119   Or  . thiamine (B-1) injection 100 mg  100 mg Intravenous Daily Laveda Abbe, NP        Lab Results:  No results found for this or any previous visit (from the past 48 hour(s)).  Blood Alcohol level:  Lab Results  Component Value Date   ETH <10 12/16/2018   ETH <10 12/11/2018    Metabolic Disorder Labs: Lab Results  Component Value Date   HGBA1C  10/23/2009    5.4 (NOTE) The ADA recommends the following therapeutic goal for glycemic control related to Hgb A1c measurement: Goal of therapy: <6.5 Hgb A1c  Reference: American Diabetes Association: Clinical Practice Recommendations 2010, Diabetes Care, 2010, 33: (Suppl  1).   MPG 108 10/23/2009   No results found for: PROLACTIN Lab Results  Component Value Date   CHOL  10/23/2009    181        ATP III CLASSIFICATION:  <200     mg/dL   Desirable  748-270  mg/dL   Borderline High  >=786    mg/dL   High          TRIG 69 10/23/2009   HDL 88 10/23/2009   CHOLHDL 2.1 10/23/2009   VLDL 14 10/23/2009   LDLCALC  10/23/2009    79        Total Cholesterol/HDL:CHD Risk Coronary Heart Disease Risk Table                     Men   Women  1/2 Average Risk   3.4   3.3  Average Risk       5.0   4.4  2 X Average Risk   9.6   7.1  3 X Average Risk  23.4   11.0        Use the calculated Patient Ratio above and the CHD Risk Table to determine the patient's CHD Risk.        ATP III CLASSIFICATION (LDL):  <100     mg/dL   Optimal  754-492  mg/dL   Near or Above                    Optimal  130-159  mg/dL   Borderline  010-071  mg/dL   High  >219     mg/dL   Very High    Physical Findings: AIMS:   , ,  ,  ,    CIWA:  CIWA-Ar Total: 9 COWS:     Musculoskeletal: Strength & Muscle Tone: within normal limits-no significant psychomotor agitation or restlessness noted, no distal tremors noted. Gait & Station: normal Patient leans: N/A  Psychiatric Specialty Exam: Physical Exam  Nursing note and vitals reviewed. Constitutional: She is oriented to person, place, and time. She appears well-developed and well-nourished.  Cardiovascular: Normal rate.  Respiratory: Effort normal.  Neurological: She is alert and oriented to person, place, and time.    Review of Systems  Constitutional: Negative.   Respiratory: Negative for cough and shortness of breath.   Cardiovascular: Negative for chest pain.  Gastrointestinal: Negative for nausea and vomiting.  Musculoskeletal: Positive for myalgias.  Neurological: Positive for headaches.  Psychiatric/Behavioral: Positive for depression and substance abuse (ETOH). Negative for hallucinations, memory loss and suicidal ideas. The patient is nervous/anxious. The patient does not have insomnia.   Reports headache stemming from recent head injury.  No chest pain, no shortness of breath, no vomiting.  Blood pressure 137/89, pulse 74, temperature 97.9 F (36.6 C), temperature source Oral, resp. rate 16, height 5\' 1"  (1.549 m), weight 59.4 kg.Body mass index is 24.75 kg/m.  General Appearance: Improving grooming  Eye Contact:  Good  Speech:  Normal Rate  Volume:  Normal  Mood:  " mood is better.  Less depressed and anxious."  Affect:  anxious but approrpaite   Thought Process:  Linear and Descriptions of Associations: Intact  Orientation:  Other:  Fully alert and attentive  Thought Content:  No hallucinations, no delusions  Suicidal Thoughts:  No today denies suicidal or self-injurious ideations  Homicidal Thoughts:  No  Memory:  Recent and remote grossly intact  Judgement:  Other:  Improving  Insight:  Fair and Improving  Psychomotor Activity:   Normal-no psychomotor restlessness or agitation  Concentration:  Concentration: Good and Attention Span: Good  Recall:  Good  Fund of Knowledge:  Good  Language:  Good  Akathisia:  No  Handed:  Right  AIMS (if indicated):     Assets:  Communication Skills Desire for Improvement Leisure Time Resilience  ADL's:  Intact  Cognition:  WNL  Sleep:  Number of Hours: 6.75    Treatment Plan Summary: Daily contact with patient to assess and evaluate symptoms and progress in treatment and Medication management  Treatment plan reviewed 12/24/2018.  Continue to encourage efforts to work on sobriety and relapse prevention Continue Encourage group and milieu participation to work on Pharmacologist and symptom reduction Treatment team working on disposition planning options-as above interested in going to [facility]  Continue Cymbalta 60 mg PO daily for mood/anxiety Continue Vistaril 25 mg PO TID PRN anxiety Continue Gabapentin 400 mg PO TID for pain Continue Librium 25 mg PO QID PRN CIWA>10 Continue Norco  5-325 mg PO Q8HR PRN pain Continue Keppra 500 mg PO BID for seizures Continue Protonix 40 mg PO daily for GERD Continue Thiamine 100 mg PO daily for supplementation Continue Campral 666 mgrs TID for alcohol dependence   Denzil Magnuson, NP 12/24/2018, 8:46 AM    Patient ID: [name], female   DOB: 25-Dec-1955, 63 y.o.   MRN: [id number]

## 2018-12-24 NOTE — Progress Notes (Signed)
Patient did attend the evening speaker AA meeting.  

## 2018-12-24 NOTE — [facility] Group Notes (Signed)
LCSW Group Therapy Note  12/24/2018   10:00-11:00am   Type of Therapy and Topic:  Group Therapy: Anger Cues and Responses  Participation Level:  Active   Description of Group:   In this group, patients learned how to recognize the physical, cognitive, emotional, and behavioral responses they have to anger-provoking situations.  They identified a recent time they became angry and how they reacted.  They analyzed how their reaction was possibly beneficial and how it was possibly unhelpful.  The group discussed a variety of healthier coping skills that could help with such a situation in the future.  An emphasis was placed on medication management, therapy and support groups.  Therapeutic Goals: 1. Patients will remember their last incident of anger and how they felt emotionally and physically, what their thoughts were at the time, and how they behaved. 2. Patients will identify how their behavior at that time worked for them, as well as how it worked against them. 3. Patients will explore possible new behaviors to use in future anger situations. 4. Patients will learn that anger itself is normal and cannot be eliminated, and that healthier reactions can assist with resolving conflict rather than worsening situations.  Summary of Patient Progress:  Patient was present and engaged in discussion. Patient reported struggling with grief and wanting to "not die now". Patient talked about going to treatment at discharge. Patient shared she is opening up the therapy and to really learning how to say things and cope with emotions instead of drinking.Patient appeared to really benefit from today's group.  Therapeutic Modalities:   Cognitive Behavioral Therapy  Shellia Cleverly

## 2018-12-24 NOTE — [facility] Group Notes (Signed)
[facility] Group Notes:  (Nursing)  Date:  12/24/2018  Time: 1:15 Type of Therapy:  Nurse Education  Participation Level:  Active  Participation Quality:  Attentive  Affect:  Appropriate  Cognitive:  Appropriate  Insight:  Appropriate  Engagement in Group:  Engaged  Modes of Intervention:  Education  Summary of Progress/Problems: Identifying Needs/ Life Skills group  Shela Nevin 12/24/2018, 2:10 PM

## 2018-12-24 NOTE — Plan of Care (Signed)
  Problem: Health Behavior/Discharge Planning: Goal: Identification of resources available to assist in meeting health care needs will improve Outcome: Progressing   

## 2018-12-24 NOTE — Progress Notes (Signed)
D Pt is observed OOB UAL on the 300 hall today. She tolerates this fairly well. She wears hospital-issue patient scrubs . She endorses a flat, depressed affect. She is very emneshd and focused on what her daughter thinks .     A She completed her daily assessment and on this she wrote she denied SI today, She rated   her depression, hopelessness and anxiety    " 5/4/5", respectively. She remains quite depressed, guarded and worried about ehr sobriety and her future.     R Safety in place.

## 2018-12-25 MED ORDER — HYDROCODONE-ACETAMINOPHEN 7.5-325 MG/15ML PO SOLN: 10.0000 mL | Freq: Four times a day (QID) | ORAL | Status: DC | PRN

## 2018-12-25 MED ORDER — HYDROCODONE-ACETAMINOPHEN 5-325 MG PO TABS: 1.5000 | ORAL_TABLET | Freq: Four times a day (QID) | ORAL | Status: DC | PRN

## 2018-12-25 MED ORDER — HYDROCODONE-ACETAMINOPHEN 7.5-325 MG/15ML PO SOLN
10.0000 mL | Freq: Four times a day (QID) | ORAL | Status: DC | PRN
  Administered 2018-12-25 – 2018-12-26 (×4): 10 mL via ORAL
  Filled 2018-12-25 (×4): qty 15

## 2018-12-25 NOTE — Progress Notes (Signed)
D Pt is observed on the unit. She presents in hospital-issued red patient scrubs. SHe has a healing left eye ecchymotic bruise ( x about 2 weeks) s/p fall with hematoma formation- why she is receiving liquid vicodin. She says she still " hurts all of the time" and says her head pain is mostly " unbearable " without the pain medicine.      A Her affect remains flat, depressed and disjointed. SHe is irritable. Unhappy. She complains constantly about her daughter and her head pain. She did complete her daily assessment and on it she wrote she has experienced SI today - but she contracts with this nurse to not hurt herslef and to ask for staff help if she can no longer stay safe. S he rated her depression,  Hopelessness and anxiety " 5/3/6", respectively and says she is hoping to " go to [facility]" when she leaves [facility].      R Safety is in place.

## 2018-12-25 NOTE — [facility] Group Notes (Signed)
LCSW Group Therapy Note   12/25/2018 1:15pm   Type of Therapy and Topic:  Group Therapy:  Overcoming Obstacles   Participation Level:  Active   Description of Group:    In this group patients will be encouraged to explore what they see as obstacles to their own wellness and recovery. They will be guided to discuss their thoughts, feelings, and behaviors related to these obstacles. The group will process together ways to cope with barriers, with attention given to specific choices patients can make. Each patient will be challenged to identify changes they are motivated to make in order to overcome their obstacles. This group will be process-oriented, with patients participating in exploration of their own experiences as well as giving and receiving support and challenge from other group members.   Therapeutic Goals: 1. Patient will identify personal and current obstacles as they relate to admission. 2. Patient will identify barriers that currently interfere with their wellness or overcoming obstacles.  3. Patient will identify feelings, thought process and behaviors related to these barriers. 4. Patient will identify two changes they are willing to make to overcome these obstacles:      Summary of Patient Progress   [name] was attentive and engaged during today's processing group. She shared that her biggest obstacle involves getting into [facility]. "I'm hoping to get in on Tuesday. I really need more days of structure and sober time before I try my luck at an [facility]." [name] continues to show progress and improving insight in the group setting. She plans to follow-up at [facility] and [facility] for support. She is also active in [place].    Therapeutic Modalities:   Cognitive Behavioral Therapy Solution Focused Therapy Motivational Interviewing Relapse Prevention Therapy  Rona Ravens, LCSW 12/25/2018 11:20 AM

## 2018-12-25 NOTE — Progress Notes (Signed)
[facility] MD Progress Note  12/25/2018 12:13 PM [name]  MRN:  [id number]  Subjective: Patient states, " I am just really worried about my daughter. I feel better as far as my plan but her health concerns me "    Objective: Face to face evaluation completed, case discussed with treatment team and chart reviewed. In brief; this is a 63 year old female, history of alcohol dependence, recent seizure, felt to be related to withdrawal- sustained head injury/subarachnoid hemorrhage on 2/22 following a witnessed seizure.  Describes history of depression/ grief symptoms related to death of loved ones.     Today, patient is alert and oriented x3, calm and cooperative. As per staff, patient was given her hydrocodone 5 mg/ acetaminophen 325 mg PO although it was noted that she removed it from her mother and it was found in her hand. This was discussed with MD so the medication was discontinued. Per nursing, patient was upset that the medication was discontinued and she asked ot speak with the MD. Medication remains discontinued at this time. She continues to present with a anxious affect although continues to endorse overall improvement in mood compared to admission.  She has been on cam[pral for several days however, reports a rash that started yesterday although reports she is unsure if the rash was secondary to Campral or body wash. She did not take morning dose of Campral.  She denies suicidal ideations, psychotic symptoms, or homicidal thoughts. She appears to be somewhat somatic at times. Her discharge  goal is to go to [facility] following discharge there are no updates at this time. She denies any active withdrawal sym,tpoms. She reports no medication related side effects. She continues to endorse wakefulness throughout the night. She denies concerns with appetite. She is active for all unit activities presenting without severe emotional instability.There are no disruptive or agitated behaviors on unit. She is  contracting for safety.       Principal Problem: MDD (major depressive disorder), recurrent severe, without psychosis (HCC) Diagnosis: Principal Problem:   MDD (major depressive disorder), recurrent severe, without psychosis (HCC) Active Problems:   Alcohol dependence with alcohol-induced mood disorder (HCC)   Subarachnoid hemorrhage (HCC)  Total Time spent with patient: 20 minutes  Past Psychiatric History: See admission H&P  Past Medical History:  Past Medical History:  Diagnosis Date  . Alcohol dependence (HCC)   . Anxiety   . Chronic pain   . Depression   . Herniated cervical disc     Past Surgical History:  Procedure Laterality Date  . ABDOMINAL HYSTERECTOMY    . ANKLE SURGERY Right   . CARPAL TUNNEL RELEASE Bilateral   . CERVICAL FUSION    . CHOLECYSTECTOMY    . KNEE SURGERY     Family History: History reviewed. No pertinent family history. Family Psychiatric  History: See admission H&P Social History:  Social History   Substance and Sexual Activity  Alcohol Use Yes  . Alcohol/week: 3.0 standard drinks  . Types: 3 Standard drinks or equivalent per week   Comment: daily use.     Social History   Substance and Sexual Activity  Drug Use Yes  . Types: Marijuana    Social History   Socioeconomic History  . Marital status: Single    Spouse name: Not on file  . Number of children: Not on file  . Years of education: Not on file  . Highest education level: Not on file  Occupational History  . Not on file  Social Needs  .  Financial resource strain: Somewhat hard  . Food insecurity:    Worry: Sometimes true    Inability: Sometimes true  . Transportation needs:    Medical: No    Non-medical: No  Tobacco Use  . Smoking status: Current Every Day Smoker    Packs/day: 0.50    Years: 7.00    Pack years: 3.50    Types: Cigarettes  . Smokeless tobacco: Never Used  Substance and Sexual Activity  . Alcohol use: Yes    Alcohol/week: 3.0 standard drinks     Types: 3 Standard drinks or equivalent per week    Comment: daily use.  . Drug use: Yes    Types: Marijuana  . Sexual activity: Not Currently  Lifestyle  . Physical activity:    Days per week: Patient refused    Minutes per session: Patient refused  . Stress: Only a little  Relationships  . Social connections:    Talks on phone: Patient refused    Gets together: Patient refused    Attends religious service: Patient refused    Active member of club or organization: Patient refused    Attends meetings of clubs or organizations: Patient refused    Relationship status: Patient refused  Other Topics Concern  . Not on file  Social History Narrative   Pt lives in a [employer]; lives with boyfriend.   Additional Social History:         Sleep: some sleep disturbance   Appetite:  Improving  Current Medications: Current Facility-Administered Medications  Medication Dose Route Frequency Provider Last Rate Last Dose  . acamprosate (CAMPRAL) tablet 666 mg  666 mg Oral TID WC Cobos, Rockey SituFernando A, MD   666 mg at 12/25/18 1129  . alum & mag hydroxide-simeth (MAALOX/MYLANTA) 200-200-20 MG/5ML suspension 30 mL  30 mL Oral Q4H PRN Laveda AbbeParks, Laurie Britton, NP      . chlordiazePOXIDE (LIBRIUM) capsule 25 mg  25 mg Oral QID PRN Antonieta Pertlary, Greg Lawson, MD   25 mg at 12/23/18 1210  . DULoxetine (CYMBALTA) DR capsule 60 mg  60 mg Oral Daily Antonieta Pertlary, Greg Lawson, MD   60 mg at 12/25/18 0743  . gabapentin (NEURONTIN) capsule 400 mg  400 mg Oral TID Laveda AbbeParks, Laurie Britton, NP   400 mg at 12/25/18 1129  . hydrOXYzine (ATARAX/VISTARIL) tablet 25 mg  25 mg Oral BID PRN Denzil Magnusonhomas, Jennelle Pinkstaff, NP      . hydrOXYzine (ATARAX/VISTARIL) tablet 50 mg  50 mg Oral QHS PRN Denzil Magnusonhomas, Jemell Town, NP   50 mg at 12/24/18 2134  . levETIRAcetam (KEPPRA) tablet 500 mg  500 mg Oral BID Laveda AbbeParks, Laurie Britton, NP   500 mg at 12/25/18 0743  . magnesium hydroxide (MILK OF MAGNESIA) suspension 30 mL  30 mL Oral Daily PRN Laveda AbbeParks, Laurie Britton, NP       . nicotine (NICODERM CQ - dosed in mg/24 hours) patch 21 mg  21 mg Transdermal Daily Laveda AbbeParks, Laurie Britton, NP   21 mg at 12/25/18 0744  . ondansetron (ZOFRAN-ODT) disintegrating tablet 4 mg  4 mg Oral Q8H PRN Laveda AbbeParks, Laurie Britton, NP   4 mg at 12/24/18 1015  . pantoprazole (PROTONIX) EC tablet 40 mg  40 mg Oral Daily Laveda AbbeParks, Laurie Britton, NP   40 mg at 12/25/18 0743  . thiamine (VITAMIN B-1) tablet 100 mg  100 mg Oral Daily Laveda AbbeParks, Laurie Britton, NP   100 mg at 12/25/18 [id number]   Or  . thiamine (B-1) injection 100 mg  100 mg Intravenous  Daily Laveda Abbe, NP        Lab Results:  No results found for this or any previous visit (from the past 48 hour(s)).  Blood Alcohol level:  Lab Results  Component Value Date   ETH <10 12/16/2018   ETH <10 12/11/2018    Metabolic Disorder Labs: Lab Results  Component Value Date   HGBA1C  10/23/2009    5.4 (NOTE) The ADA recommends the following therapeutic goal for glycemic control related to Hgb A1c measurement: Goal of therapy: <6.5 Hgb A1c  Reference: American Diabetes Association: Clinical Practice Recommendations 2010, Diabetes Care, 2010, 33: (Suppl  1).   MPG 108 10/23/2009   No results found for: PROLACTIN Lab Results  Component Value Date   CHOL  10/23/2009    181        ATP III CLASSIFICATION:  <200     mg/dL   Desirable  269-485  mg/dL   Borderline High  >=462    mg/dL   High          TRIG 69 10/23/2009   HDL 88 10/23/2009   CHOLHDL 2.1 10/23/2009   VLDL 14 10/23/2009   LDLCALC  10/23/2009    79        Total Cholesterol/HDL:CHD Risk Coronary Heart Disease Risk Table                     Men   Women  1/2 Average Risk   3.4   3.3  Average Risk       5.0   4.4  2 X Average Risk   9.6   7.1  3 X Average Risk  23.4   11.0        Use the calculated Patient Ratio above and the CHD Risk Table to determine the patient's CHD Risk.        ATP III CLASSIFICATION (LDL):  <100     mg/dL   Optimal  703-500  mg/dL    Near or Above                    Optimal  130-159  mg/dL   Borderline  938-182  mg/dL   High  >993     mg/dL   Very High    Physical Findings: AIMS:  , ,  ,  ,    CIWA:  CIWA-Ar Total: 9 COWS:     Musculoskeletal: Strength & Muscle Tone: within normal limits-no significant psychomotor agitation or restlessness noted, no distal tremors noted. Gait & Station: normal Patient leans: N/A  Psychiatric Specialty Exam: Physical Exam  Nursing note and vitals reviewed. Constitutional: She is oriented to person, place, and time. She appears well-developed and well-nourished.  Cardiovascular: Normal rate.  Respiratory: Effort normal.  Neurological: She is alert and oriented to person, place, and time.    Review of Systems  Constitutional: Negative.   Respiratory: Negative for cough and shortness of breath.   Cardiovascular: Negative for chest pain.  Gastrointestinal: Negative for nausea and vomiting.  Musculoskeletal: Positive for myalgias.  Neurological: Positive for headaches.  Psychiatric/Behavioral: Positive for depression and substance abuse (ETOH). Negative for hallucinations, memory loss and suicidal ideas. The patient is nervous/anxious. The patient does not have insomnia.   Reports headache stemming from recent head injury.  No chest pain, no shortness of breath, no vomiting.  Blood pressure 130/90, pulse 78, temperature 98.2 F (36.8 C), temperature source Oral, resp. rate 16, height 5\' 1"  (1.549 m),  weight 59.4 kg.Body mass index is 24.75 kg/m.  General Appearance: Improving grooming  Eye Contact:  Good  Speech:  Normal Rate  Volume:  Normal  Mood:  " mood is better. Less depressed and anxious."  Affect:  anxious   Thought Process:  Linear and Descriptions of Associations: Intact  Orientation:  Other:  Fully alert and attentive  Thought Content:  No hallucinations, no delusions  Suicidal Thoughts:  No today denies suicidal or self-injurious ideations  Homicidal Thoughts:   No  Memory:  Recent and remote grossly intact  Judgement:  Other:  Improving  Insight:  Fair and Improving  Psychomotor Activity:  Normal-no psychomotor restlessness or agitation  Concentration:  Concentration: Good and Attention Span: Good  Recall:  Good  Fund of Knowledge:  Good  Language:  Good  Akathisia:  No  Handed:  Right  AIMS (if indicated):     Assets:  Communication Skills Desire for Improvement Leisure Time Resilience  ADL's:  Intact  Cognition:  WNL  Sleep:  Number of Hours: 5.25    Treatment Plan Summary: Daily contact with patient to assess and evaluate symptoms and progress in treatment and Medication management  Treatment plan reviewed 12/25/2018.  Continue to encourage efforts to work on sobriety and relapse prevention Continue Encourage group and milieu participation to work on Pharmacologist and symptom reduction Treatment team working on disposition planning options-as above interested in going to [facility]  Continue Cymbalta 60 mg PO daily for mood/anxiety Continue Vistaril 25 mg PO BID PRN anxiety and Vistaril 50 mg po daily at bedtime as needed for insomnia  Continue Gabapentin 400 mg PO TID for pain Continue Librium 25 mg PO QID PRN CIWA>10 Disconitnued Norco  5-325 mg PO Q8HR PRN pain Continue Keppra 500 mg PO BID for seizures Continue Protonix 40 mg PO daily for GERD Continue Thiamine 100 mg PO daily for supplementation Continue Campral 666 mgrs TID for alcohol dependence   Denzil Magnuson, NP 12/25/2018, 12:13 PM    Patient ID: [name], female   DOB: 1955-12-26, 63 y.o.   MRN: [id number]

## 2018-12-25 NOTE — Progress Notes (Signed)
Pt walked away from medication window after writer gave her hydrocodone 5 mg/ acetaminophen 325 mg PO.  Writer saw her move her hand to her mouth as she was walking away and asked pt to return to medication window.  Writer asked to see her hand and the medication was in her hand.  Pt then took the medication and was monitored for cheeking.  Pt swallowed the tablet.  MHT heard pt's roommate ask her "are they going to give you anything?" right after this took place.

## 2018-12-25 NOTE — Progress Notes (Addendum)
D: Pt was in dayroom upon initial approach.  Pt presents with anxious affect and mood.  She discussed how "I met my goals today.  I read my AA book, read the blue book, rested when I had a headache."  Pt denies SI/HI, denies hallucinations, reports pain from headache of 8/10.  Pt has been visible in milieu interacting with peers and staff appropriately.  Pt attended evening group.    A: Met with pt 1:1.  Actively listened to pt and offered support and encouragement.   PRN medication administered for pain and sleep.  Q15 minute safety checks maintained.  R: Pt is safe on the unit.  Pt is compliant with medications.  Pt verbally contracts for safety.  Will continue to monitor and assess.

## 2018-12-26 MED ORDER — HYDROCODONE-ACETAMINOPHEN 7.5-325 MG/15ML PO SOLN
10.0000 mL | Freq: Two times a day (BID) | ORAL | Status: DC | PRN
  Administered 2018-12-26 – 2018-12-27 (×2): 10 mL via ORAL
  Filled 2018-12-26 (×2): qty 15

## 2018-12-26 NOTE — Progress Notes (Signed)
Adult Psychoeducational Group Note  Date:  12/26/2018 Time:  1:56 PM  Group Topic/Focus:  Goals Group:   The focus of this group is to help patients establish daily goals to achieve during treatment and discuss how the patient can incorporate goal setting into their daily lives to aide in recovery.  Participation Level:  Active  Participation Quality:  Appropriate  Affect:  Appropriate  Cognitive:  Alert  Insight: Appropriate  Engagement in Group:  Engaged  Modes of Intervention:  Discussion  Additional Comments:  Pt attended group and participated in discussion.  Zigmund Linse R Yaseen Gilberg 12/26/2018, 1:56 PM

## 2018-12-26 NOTE — Progress Notes (Signed)
D:  Patient's self inventory sheet, patient has poor sleep, sleep medication not heolpful.  Fair appetite, normal energy level, good concentration.  Rated depression, hopeless and anxiety #4.  Denied withdrawals.  Checked cramping, agitation, irritability, anxious, worried.  SI sometimes.  Physical problems, lightheaded, pain, dizziness, headaches, blurred vision, left head trauma, left leg pain.  Patient denied SI while talking to nurse this morning, contracts for safety  Plans to find out about [facility], be happy, positive and talk to MD/SW.  Staff has been great/helpful.  Does have discharge plans. A:  Medications administered per MD orders.  Emotional support and encouragement given patient. R:  Denied SI and HI while talking to patient today, contracts for safety.  Denied A/V hallucinations.   Safety maintained with 15 minute checks.

## 2018-12-26 NOTE — Progress Notes (Signed)
Patient has been accepted to [facility], she can arrive at 1:00pm tomorrow.  Per Dr.Cobos, patient does not meet criteria to be IVC'd for transportation. CSW will follow up with Social Work Chiropodist regarding a Conservation officer, nature.   [name], LCSW-A Clinical Social Worker

## 2018-12-26 NOTE — Plan of Care (Signed)
  Problem: Safety: Goal: Periods of time without injury will increase Outcome: Progressing Note:  Pt has not harmed self or others tonight.  She denies SI/HI and verbally contracts for safety.    

## 2018-12-26 NOTE — Progress Notes (Addendum)
[facility] Progress Note  12/26/2018 2:13 PM [name]  MRN:  [id number] Subjective:  "I'm good. I'm going to [facility] tomorrow."  [name] found sitting in the dayroom. Reports improving mood and appears future-oriented with plan to go to [facility] tomorrow for rehab. She does continue to report headache from seizure-related fall prior to admission. Per nursing report she has been requesting pain medication frequently. Patient states understanding that [facility] and that plan is to titrate down for discontinuation prior to discharge. Denies SI/HI/AVH. Denies medication side effects. No agitated or disruptive behaviors on the unit.  From admission H&P: Patient is a 63 year old female with a past psychiatric history significant for alcohol dependence with withdrawal seizures, history of DTs, anxiety, depression, chronic pain who [facility] department on 12/11/2018 after having a seizure. The patient stated she had no recollection of the event. She was alert and oriented, but per EMS she had a witnessed seizure at a bus stop. The patient stated she had been treated for seizure disorders in the past as well as depression but had not had any medication for several weeks. She was also trying to quit alcohol on her own. Her last drink was reportedly on 12/09/2018. [facility] where she was evaluated after her seizure.Unfortunately she was found to have a subarachnoid hemorrhage which was diagnosed on 2/22 after her head injury. She was discharged home on 2/27, but returned to the emergency department on 2/28 because of headache and suicidal ideation. She stated she continued to have a headache after her fall.   Principal Problem: MDD (major depressive disorder), recurrent severe, without psychosis (HCC) Diagnosis: Principal Problem:   MDD (major depressive  disorder), recurrent severe, without psychosis (HCC) Active Problems:   Alcohol dependence with alcohol-induced mood disorder (HCC)   Subarachnoid hemorrhage (HCC)  Total Time spent with patient: 15 minutes  Past Psychiatric History: See admission H&P  Past Medical History:  Past Medical History:  Diagnosis Date  . Alcohol dependence (HCC)   . Anxiety   . Chronic pain   . Depression   . Herniated cervical disc     Past Surgical History:  Procedure Laterality Date  . ABDOMINAL HYSTERECTOMY    . ANKLE SURGERY Right   . CARPAL TUNNEL RELEASE Bilateral   . CERVICAL FUSION    . CHOLECYSTECTOMY    . KNEE SURGERY     Family History: History reviewed. No pertinent family history. Family Psychiatric  History: See admission H&P Social History:  Social History   Substance and Sexual Activity  Alcohol Use Yes  . Alcohol/week: 3.0 standard drinks  . Types: 3 Standard drinks or equivalent per week   Comment: daily use.     Social History   Substance and Sexual Activity  Drug Use Yes  . Types: Marijuana    Social History   Socioeconomic History  . Marital status: Single    Spouse name: Not on file  . Number of children: Not on file  . Years of education: Not on file  . Highest education level: Not on file  Occupational History  . Not on file  Social Needs  . Financial resource strain: Somewhat hard  . Food insecurity:    Worry: Sometimes true    Inability: Sometimes true  . Transportation needs:    Medical: No    Non-medical: No  Tobacco Use  .  Smoking status: Current Every Day Smoker    Packs/day: 0.50    Years: 7.00    Pack years: 3.50    Types: Cigarettes  . Smokeless tobacco: Never Used  Substance and Sexual Activity  . Alcohol use: Yes    Alcohol/week: 3.0 standard drinks    Types: 3 Standard drinks or equivalent per week    Comment: daily use.  . Drug use: Yes    Types: Marijuana  . Sexual activity: Not Currently  Lifestyle  . Physical activity:     Days per week: Patient refused    Minutes per session: Patient refused  . Stress: Only a little  Relationships  . Social connections:    Talks on phone: Patient refused    Gets together: Patient refused    Attends religious service: Patient refused    Active member of club or organization: Patient refused    Attends meetings of clubs or organizations: Patient refused    Relationship status: Patient refused  Other Topics Concern  . Not on file  Social History Narrative   Pt lives in a [employer]; lives with boyfriend.   Additional Social History:                         Sleep: Good  Appetite:  Good  Current Medications: Current Facility-Administered Medications  Medication Dose Route Frequency Provider Last Rate Last Dose  . acamprosate (CAMPRAL) tablet 666 mg  666 mg Oral TID WC Porshe Fleagle, Rockey Situ, MD   666 mg at 12/26/18 1216  . alum & mag hydroxide-simeth (MAALOX/MYLANTA) 200-200-20 MG/5ML suspension 30 mL  30 mL Oral Q4H PRN Laveda Abbe, NP      . DULoxetine (CYMBALTA) DR capsule 60 mg  60 mg Oral Daily Antonieta Pert, MD   60 mg at 12/26/18 1610  . gabapentin (NEURONTIN) capsule 400 mg  400 mg Oral TID Laveda Abbe, NP   400 mg at 12/26/18 1218  . HYDROcodone-acetaminophen (HYCET) 7.5-325 mg/15 ml solution 10 mL  10 mL Oral Q12H PRN Melton Walls, Rockey Situ, MD      . hydrOXYzine (ATARAX/VISTARIL) tablet 25 mg  25 mg Oral BID PRN Denzil Magnuson, NP      . hydrOXYzine (ATARAX/VISTARIL) tablet 50 mg  50 mg Oral QHS PRN Denzil Magnuson, NP   50 mg at 12/25/18 2116  . levETIRAcetam (KEPPRA) tablet 500 mg  500 mg Oral BID Laveda Abbe, NP   500 mg at 12/26/18 9604  . magnesium hydroxide (MILK OF MAGNESIA) suspension 30 mL  30 mL Oral Daily PRN Laveda Abbe, NP      . nicotine (NICODERM CQ - dosed in mg/24 hours) patch 21 mg  21 mg Transdermal Daily Laveda Abbe, NP   21 mg at 12/26/18 0806  . ondansetron (ZOFRAN-ODT)  disintegrating tablet 4 mg  4 mg Oral Q8H PRN Laveda Abbe, NP   4 mg at 12/24/18 1015  . pantoprazole (PROTONIX) EC tablet 40 mg  40 mg Oral Daily Laveda Abbe, NP   40 mg at 12/26/18 5409  . thiamine (VITAMIN B-1) tablet 100 mg  100 mg Oral Daily Laveda Abbe, NP   100 mg at 12/26/18 8119   Or  . thiamine (B-1) injection 100 mg  100 mg Intravenous Daily Laveda Abbe, NP        Lab Results: No results found for this or any previous visit (from the past 48  hour(s)).  Blood Alcohol level:  Lab Results  Component Value Date   ETH <10 12/16/2018   ETH <10 12/11/2018    Metabolic Disorder Labs: Lab Results  Component Value Date   HGBA1C  10/23/2009    5.4 (NOTE) The ADA recommends the following therapeutic goal for glycemic control related to Hgb A1c measurement: Goal of therapy: <6.5 Hgb A1c  Reference: American Diabetes Association: Clinical Practice Recommendations 2010, Diabetes Care, 2010, 33: (Suppl  1).   MPG 108 10/23/2009   No results found for: PROLACTIN Lab Results  Component Value Date   CHOL  10/23/2009    181        ATP III CLASSIFICATION:  <200     mg/dL   Desirable  737-106  mg/dL   Borderline High  >=269    mg/dL   High          TRIG 69 10/23/2009   HDL 88 10/23/2009   CHOLHDL 2.1 10/23/2009   VLDL 14 10/23/2009   LDLCALC  10/23/2009    79        Total Cholesterol/HDL:CHD Risk Coronary Heart Disease Risk Table                     Men   Women  1/2 Average Risk   3.4   3.3  Average Risk       5.0   4.4  2 X Average Risk   9.6   7.1  3 X Average Risk  23.4   11.0        Use the calculated Patient Ratio above and the CHD Risk Table to determine the patient's CHD Risk.        ATP III CLASSIFICATION (LDL):  <100     mg/dL   Optimal  485-462  mg/dL   Near or Above                    Optimal  130-159  mg/dL   Borderline  703-500  mg/dL   High  >938     mg/dL   Very High    Physical Findings: AIMS: Facial and Oral  Movements Muscles of Facial Expression: None, normal Lips and Perioral Area: None, normal Jaw: None, normal Tongue: None, normal,Extremity Movements Upper (arms, wrists, hands, fingers): None, normal Lower (legs, knees, ankles, toes): None, normal, Trunk Movements Neck, shoulders, hips: None, normal, Overall Severity Severity of abnormal movements (highest score from questions above): None, normal Incapacitation due to abnormal movements: None, normal Patient's awareness of abnormal movements (rate only patient's report): No Awareness, Dental Status Current problems with teeth and/or dentures?: No Does patient usually wear dentures?: No  CIWA:  CIWA-Ar Total: 1 COWS:  COWS Total Score: 1  Musculoskeletal: Strength & Muscle Tone: within normal limits Gait & Station: normal Patient leans: N/A  Psychiatric Specialty Exam: Physical Exam  Nursing note and vitals reviewed. Constitutional: She is oriented to person, place, and time. She appears well-developed and well-nourished.  Cardiovascular: Normal rate.  Respiratory: Effort normal.  Neurological: She is alert and oriented to person, place, and time.    Review of Systems  Constitutional: Negative.   Neurological: Positive for headaches.  Psychiatric/Behavioral: Positive for substance abuse (hx ETOH). Negative for depression, hallucinations, memory loss and suicidal ideas. The patient is not nervous/anxious and does not have insomnia.     Blood pressure 102/83, pulse 72, temperature 97.7 F (36.5 C), temperature source Oral, resp. rate 16, height 5\' 1"  (1.549  m), weight 59.4 kg.Body mass index is 24.75 kg/m.  General Appearance: Casual  Eye Contact:  Fair  Speech:  Normal Rate  Volume:  Normal  Mood:  Euthymic  Affect:  Congruent  Thought Process:  Coherent  Orientation:  Full (Time, Place, and Person)  Thought Content:  WDL  Suicidal Thoughts:  No  Homicidal Thoughts:  No  Memory:  Immediate;   Fair Recent;   Fair   Judgement:  Fair  Insight:  Fair  Psychomotor Activity:  Normal  Concentration:  Concentration: Good  Recall:  Fair  Fund of Knowledge:  Fair  Language:  Good  Akathisia:  No  Handed:  Right  AIMS (if indicated):     Assets:  Communication Skills Desire for Improvement Leisure Time Resilience  ADL's:  Intact  Cognition:  WNL  Sleep:  Number of Hours: 6     Treatment Plan Summary: Daily contact with patient to assess and evaluate symptoms and progress in treatment and Medication management   Continue inpatient hospitalization.  Decrease Hycet 7.5-325 mg/15 mL to 10 mL Q12HR PRN pain Continue Cymbalta 60 mg PO daily for mood Continue gabapentin 400 mg PO TID for pain Continue Vistaril 25 mg PO BID PRN anxiety and 50 mg PO QHS PRN insomnia Continue Keppra 500 mg PO BID for seizure disorder Continue Protonix 40 mg PO daily for GERD  Patient will participate in the therapeutic group milieu.  Discharge disposition in progress.   Aldean Baker, NP 12/26/2018, 2:13 PM   Agree with NP progress note

## 2018-12-26 NOTE — Progress Notes (Signed)
Patient attended grief and loss group facilitated by Ethel Rana, MS, [facility], N[facility].   Group focuses on change and loss experienced by group members; topics include loss due to death, loss of relationships, loss of a place, loss of sense of self, etc. Group members are invited to share the changes and losses that are currently affecting their lives. Facilitators tie together shared experiences between group members and validate emotions felt by participants.  Group members connected their experiences of grieving themselves, grieving changes in relationships, and dealing with others' expectations for how the individuals should grieve to one another. Group members created connections based on their grief experiences. Group members expressed gratitude for a group devoted to grief and reported it was the first time grief was addressed in treatment.  Patient [name] was present through and participated in group discussion. Pt discussed the loss of her family members and how she used alcohol to cope with her grief. Pt discussed losing her housing and her boyfriend's recent prison sentence and how she is grieving these large changes in her life. Pt expressed anxiety over not knowing where she will go next or what her plan is following discharge. Pt reported she wants to seek grief services at hospice following discharge.  Ethel Rana, MS, [facility], [facility]

## 2018-12-27 MED ORDER — DULOXETINE HCL 60 MG PO CPEP: 60.0000 mg | ORAL_CAPSULE | Freq: Every day | ORAL | 0 refills | Status: DC

## 2018-12-27 MED ORDER — LEVETIRACETAM 500 MG PO TABS: 500.0000 mg | ORAL_TABLET | Freq: Two times a day (BID) | ORAL | 0 refills | Status: DC

## 2018-12-27 MED ORDER — HYDROXYZINE HCL 50 MG PO TABS: 50.0000 mg | ORAL_TABLET | Freq: Every evening | ORAL | 0 refills | Status: DC | PRN

## 2018-12-27 MED ORDER — HYDROXYZINE HCL 25 MG PO TABS: 25.0000 mg | ORAL_TABLET | Freq: Two times a day (BID) | ORAL | 0 refills | Status: DC | PRN

## 2018-12-27 MED ORDER — ACAMPROSATE CALCIUM 333 MG PO TBEC: 666.0000 mg | DELAYED_RELEASE_TABLET | Freq: Three times a day (TID) | ORAL | 0 refills | Status: DC

## 2018-12-27 MED ORDER — PANTOPRAZOLE SODIUM 40 MG PO TBEC: 40.0000 mg | DELAYED_RELEASE_TABLET | Freq: Every day | ORAL | 0 refills | Status: DC

## 2018-12-27 MED ORDER — GABAPENTIN 400 MG PO CAPS: 400.0000 mg | ORAL_CAPSULE | Freq: Three times a day (TID) | ORAL | 0 refills | Status: DC

## 2018-12-27 NOTE — Progress Notes (Signed)
D:  Patient's self inventory sheet, patient has fair sleep, sleep medication not helpful.  Fair appetite, normal energy level, good concentration.  Rated depression and hopeless 4, anxiety 5.  Denied withdrawals, then checked cramping, agitation, irritability, worried, anxious.  Denied SI.  Physical problems, lightheaded, pain, headaches, blurred vision, L head trauma, leg, neck fusion.  Physical pain, worst pain in past 24 hours is #8.   Head, neck, L leg, pain medication helpful.  Goal is [facility] today.  Call [facility] today.  Plans to listen to others and take their advice.  Concerned about medications for head pain. A:  Medications administered per MD orders.  Emotional support and encouragement given patient. R:  Denied SI and HI, contracts for safety.  Denied A/V hallucinations.  Safety maintained with 15 minute checks.

## 2018-12-27 NOTE — Progress Notes (Signed)
  [facility] Adult Case Management Discharge Plan :  Will you be returning to the same living situation after discharge:  No. Going to [facility] At discharge, do you have transportation home?: Yes,  taxi voucher Do you have the ability to pay for your medications: Yes,  Medicaid.  Release of information consent forms completed and in the chart. Taxi voucher on chart.  Patient to Follow up at: Follow-up Information    [facility]. Go on 12/27/2018.   Why:  Your intake appointment is at 1:00pm on 12/27/2018. Referral faxed 03/02. Auth # [id number] SH [id number] Contact information: [address] [place] [address] [phone]           Next level of care provider has access to [facility] Link:no  Safety Planning and Suicide Prevention discussed: Yes,  with patient  Have you used any form of tobacco in the last 30 days? (Cigarettes, Smokeless Tobacco, Cigars, and/or Pipes): Yes  Has patient been referred to the Quitline?: Patient refused referral  Patient has been referred for addiction treatment: Yes  Darreld Mclean, LCSWA 12/27/2018, 10:31 AM

## 2018-12-27 NOTE — [facility] Suicide Risk Assessment (Signed)
[facility] Discharge Suicide Risk Assessment   Principal Problem: MDD (major depressive disorder), recurrent severe, without psychosis (HCC) Discharge Diagnoses: Principal Problem:   MDD (major depressive disorder), recurrent severe, without psychosis (HCC) Active Problems:   Alcohol dependence with alcohol-induced mood disorder (HCC)   Subarachnoid hemorrhage (HCC)   Total Time spent with patient: 15 minutes  Musculoskeletal: Strength & Muscle Tone: within normal limits Gait & Station: normal Patient leans: N/A  Psychiatric Specialty Exam: Review of Systems  Neurological: Positive for headaches.  All other systems reviewed and are negative.   Blood pressure 126/87, pulse 63, temperature 97.8 F (36.6 C), temperature source Oral, resp. rate 16, height 5\' 1"  (1.549 m), weight 59.4 kg.Body mass index is 24.75 kg/m.  General Appearance: Casual  Eye Contact::  Fair  Speech:  Normal Rate409  Volume:  Normal  Mood:  Anxious  Affect:  Congruent  Thought Process:  Coherent and Descriptions of Associations: Intact  Orientation:  Full (Time, Place, and Person)  Thought Content:  Logical  Suicidal Thoughts:  No  Homicidal Thoughts:  No  Memory:  Immediate;   Fair Recent;   Fair Remote;   Fair  Judgement:  Intact  Insight:  Fair  Psychomotor Activity:  Increased  Concentration:  Fair  Recall:  [employer] of Knowledge:Fair  Language: Fair  Akathisia:  Negative  Handed:  Right  AIMS (if indicated):     Assets:  Communication Skills Desire for Improvement Resilience  Sleep:  Number of Hours: 6.25  Cognition: WNL  ADL's:  Intact   Mental Status Per Nursing Assessment::   On Admission:  Suicidal ideation indicated by patient  Demographic Factors:  Divorced or widowed, Caucasian and Living alone  Loss Factors: Financial problems/change in socioeconomic status  Historical Factors: Impulsivity  Risk Reduction Factors:   Positive coping skills or problem solving  skills  Continued Clinical Symptoms:  Depression:   Comorbid alcohol abuse/dependence Impulsivity Alcohol/Substance Abuse/Dependencies  Cognitive Features That Contribute To Risk:  None    Suicide Risk:  Minimal: No identifiable suicidal ideation.  Patients presenting with no risk factors but with morbid ruminations; may be classified as minimal risk based on the severity of the depressive symptoms  Follow-up Information    [facility]. Go on 12/27/2018.   Why:  Your intake appointment is at 1:00pm on 12/27/2018. Referral faxed 03/02. Auth # [id number] SH [id number] Contact information: [address] [place] [address] [phone]           Plan Of Care/Follow-up recommendations:  Activity:  ad lib  Antonieta Pert, MD 12/27/2018, 8:23 AM

## 2018-12-27 NOTE — Progress Notes (Signed)
Patient ID: [name], female   DOB: [birth year], 63 y.o.   MRN: [id number]   D: Patient anxious on approach. Fixating on when she can get the pain medication. Assured me that she wasn't craving it because of hx of alcohol abuse but seems to have some trouble understanding that it is an addictive drug and it is being tapered down. Does not appear to be in any physical distress when observed interacting and walking around on the unit. Talked about going to [facility] tomorrow.  A: Staff will monitor on q 15 minute, follow treatment plan, and give medications as ordered. R: Cooperative on the unit.

## 2018-12-27 NOTE — [facility] Group Notes (Signed)
Adult Psychoeducational Group Note  Date:  12/27/2018 Time:  10:07 AM  Group Topic/Focus:  Goals Group:   The focus of this group is to help patients establish daily goals to achieve during treatment and discuss how the patient can incorporate goal setting into their daily lives to aide in recovery.  Participation Level:  Active  Participation Quality:  Appropriate  Affect:  Appropriate  Cognitive:  Appropriate  Insight: Appropriate  Engagement in Group:  Engaged  Modes of Intervention:  Discussion  Additional Comments:  Pt participated in morning goals group.   [name] [name] 12/27/2018, 10:07 AM

## 2018-12-27 NOTE — Discharge Summary (Signed)
Physician Discharge Summary Note  Patient:  [name] is an 63 y.o., female MRN:  [id number] DOB:  March 30, 1956 Patient phone:  [phone] (home)  Patient address:   [address] [place] [address],  Total Time spent with patient: 20 minutes  Date of Admission:  12/17/2018 Date of Discharge: 12/27/18  Reason for Admission:  Worsening depression with SI  Principal Problem: MDD (major depressive disorder), recurrent severe, without psychosis (HCC) Discharge Diagnoses: Principal Problem:   MDD (major depressive disorder), recurrent severe, without psychosis (HCC) Active Problems:   Alcohol dependence with alcohol-induced mood disorder (HCC)   Subarachnoid hemorrhage (HCC)   Past Psychiatric History: Patient has had several hospitalizations at our facility.  The last was in December 2019.  She has been treated with several medications for depression including Cymbalta.  She has a long history of alcohol dependence with hospitalizations dating back as far as 2011.  She stated that after her last hospitalization she was sober for 2 weeks after discharge.  She admitted to multiple times of being and detox as well as substance rehabilitation.  Past Medical History:  Past Medical History:  Diagnosis Date  . Alcohol dependence (HCC)   . Anxiety   . Chronic pain   . Depression   . Herniated cervical disc     Past Surgical History:  Procedure Laterality Date  . ABDOMINAL HYSTERECTOMY    . ANKLE SURGERY Right   . CARPAL TUNNEL RELEASE Bilateral   . CERVICAL FUSION    . CHOLECYSTECTOMY    . KNEE SURGERY     Family History: History reviewed. No pertinent family history. Family Psychiatric  History: Denies Social History:  Social History   Substance and Sexual Activity  Alcohol Use Yes  . Alcohol/week: 3.0 standard drinks  . Types: 3 Standard drinks or equivalent per week   Comment: daily use.     Social History   Substance and Sexual Activity  Drug Use  Yes  . Types: Marijuana    Social History   Socioeconomic History  . Marital status: Single    Spouse name: Not on file  . Number of children: Not on file  . Years of education: Not on file  . Highest education level: Not on file  Occupational History  . Not on file  Social Needs  . Financial resource strain: Somewhat hard  . Food insecurity:    Worry: Sometimes true    Inability: Sometimes true  . Transportation needs:    Medical: No    Non-medical: No  Tobacco Use  . Smoking status: Current Every Day Smoker    Packs/day: 0.50    Years: 7.00    Pack years: 3.50    Types: Cigarettes  . Smokeless tobacco: Never Used  Substance and Sexual Activity  . Alcohol use: Yes    Alcohol/week: 3.0 standard drinks    Types: 3 Standard drinks or equivalent per week    Comment: daily use.  . Drug use: Yes    Types: Marijuana  . Sexual activity: Not Currently  Lifestyle  . Physical activity:    Days per week: Patient refused    Minutes per session: Patient refused  . Stress: Only a little  Relationships  . Social connections:    Talks on phone: Patient refused    Gets together: Patient refused    Attends religious service: Patient refused    Active member of club or organization: Patient refused    Attends meetings of clubs  or organizations: Patient refused    Relationship status: Patient refused  Other Topics Concern  . Not on file  Social History Narrative   Pt lives in a [employer]; lives with boyfriend.    Hospital Course:   12/17/18 [facility] MD Assessment: 63 year old female with a past psychiatric history significant for alcohol dependence with withdrawal seizures, history of DTs, anxiety, depression, chronic pain who originally presented to the [facility] emergency department on 12/11/2018 after having a seizure. The patient stated she had no recollection of the event. She was alert and oriented, but per EMS she had a witnessed seizure at a bus stop. The  patient stated she had been treated for seizure disorders in the past as well as depression but had not had any medication for several weeks. She was also trying to quit alcohol on her own. Her last drink was reportedly on 12/09/2018. She was admitted to the [facility] long medical service where she was evaluated after her seizure.Unfortunately she was found to have a subarachnoid hemorrhage which was diagnosed on 2/22 after her head injury. She was discharged home on 2/27, but returned to the emergency department on 2/28 because of headache and suicidal ideation. She stated she continued to have a headache after her fall. She told the emergency room department that she was suicidal and "I have hit rock bottom". She was transferred to our facility for evaluation and stabilization. The patient stated that she felt as though she had multiple losses with death of family members for several years, and her boyfriend was returning to prison. She had been living at a long-term hotel, but had been evicted because she did not have any Areya Lemmerman. She stated she was wanting to go to an [facility] after discharge.  Patient remained on the [facility] unit for 11 days. The patient stabilized on medication and therapy. Patient was discharged on Campral 666 mg TID, Cymbalta 60 mg Daily, Gabapentin 400 mg TID, Vistaril 25 mg BID and 50 mg QHS, Keppra 500 BID. Patient has shown improvement with improved mood, affect, sleep, appetite, and interaction. Patient has attended group and participated. Patient has been seen in the day room interacting with peers and staff appropriately. Patient denies any SI/HI/AVH and contracts for safety. Patient agrees to follow up at [facility] abuse treat[facility] center. Patient is provided with prescriptions for their medications upon discharge.   Physical Findings: AIMS: Facial and Oral Movements Muscles of Facial Expression: None, normal Lips and Perioral Area: None, normal Jaw:  None, normal Tongue: None, normal,Extremity Movements Upper (arms, wrists, hands, fingers): None, normal Lower (legs, knees, ankles, toes): None, normal, Trunk Movements Neck, shoulders, hips: None, normal, Overall Severity Severity of abnormal movements (highest score from questions above): None, normal Incapacitation due to abnormal movements: None, normal Patient's awareness of abnormal movements (rate only patient's report): No Awareness, Dental Status Current problems with teeth and/or dentures?: No Does patient usually wear dentures?: No  CIWA:  CIWA-Ar Total: 1 COWS:  COWS Total Score: 1  Musculoskeletal: Strength & Muscle Tone: within normal limits Gait & Station: normal Patient leans: N/A  Psychiatric Specialty Exam: Physical Exam  Nursing note and vitals reviewed. Constitutional: She is oriented to person, place, and time. She appears well-developed and well-nourished.  Cardiovascular: Normal rate.  Respiratory: Effort normal.  Musculoskeletal: Normal range of motion.  Neurological: She is alert and oriented to person, place, and time.  Skin: Skin is warm.    Review of Systems  Constitutional: Negative.   HENT: Negative.   Eyes: Negative.   Respiratory: Negative.   Cardiovascular: Negative.   Gastrointestinal: Negative.   Genitourinary: Negative.   Musculoskeletal: Negative.   Skin: Negative.   Neurological: Negative.   Endo/Heme/Allergies: Negative.   Psychiatric/Behavioral: Negative.     Blood pressure 126/87, pulse 63, temperature 97.8 F (36.6 C), temperature source Oral, resp. rate 16, height  (1.549 m), weight 59.4 kg.Body mass index is 24.75 kg/m.  General Appearance: Casual  Eye Contact:  Good  Speech:  Clear and Coherent and Normal Rate  Volume:  Normal  Mood:  Euthymic  Affect:  Congruent  Thought Process:  Coherent and Descriptions of Associations: Intact  Orientation:  Full (Time, Place, and Person)  Thought Content:  WDL  Suicidal  Thoughts:  No  Homicidal Thoughts:  No  Memory:  Immediate;   Good Recent;   Good Remote;   Good  Judgement:  Fair  Insight:  Fair  Psychomotor Activity:  Normal  Concentration:  Concentration: Good and Attention Span: Good  Recall:  Good  Fund of Knowledge:  Good  Language:  Good  Akathisia:  No  Handed:  Right  AIMS (if indicated):     Assets:  Communication Skills Desire for Improvement Financial Resources/Insurance Housing Physical Health Social Support Transportation  ADL's:  Intact  Cognition:  WNL  Sleep:  Number of Hours: 6.25     Have you used any form of tobacco in the last 30 days? (Cigarettes, Smokeless Tobacco, Cigars, and/or Pipes): Yes  Has this patient used any form of tobacco in the last 30 days? (Cigarettes, Smokeless Tobacco, Cigars, and/or Pipes) Yes, Yes, A prescription for an FDA-approved tobacco cessation medication was offered at discharge and the patient refused  Blood Alcohol level:  Lab Results  Component Value Date   [facility] <10 12/16/2018   ETH <10 12/11/2018    Metabolic Disorder Labs:  Lab Results  Component Value Date   HGBA1C  10/23/2009    5.4 (NOTE) The ADA recommends the following therapeutic goal for glycemic control related to Hgb A1c measurement: Goal of therapy: <6.5 Hgb A1c  Reference: American Diabetes Association: Clinical Practice Recommendations 2010, Diabetes Care, 2010, 33: (Suppl  1).   MPG 108 10/23/2009   No results found for: PROLACTIN Lab Results  Component Value Date   CHOL  10/23/2009    181        ATP III CLASSIFICATION:  <200     mg/dL   Desirable  161-096  mg/dL   Borderline High  >=045    mg/dL   High          TRIG 69 10/23/2009   HDL 88 10/23/2009   CHOLHDL 2.1 10/23/2009   VLDL 14 10/23/2009   LDLCALC  10/23/2009    79        Total Cholesterol/HDL:CHD Risk Coronary Heart Disease Risk Table                     Men   Women  1/2 Average Risk   3.4   3.3  Average Risk       5.0   4.4  2 X Average  Risk   9.6   7.1  3 X Average Risk  23.4   11.0        Use the calculated Patient Ratio above and the CHD Risk Table to determine the patient's CHD Risk.        ATP III CLASSIFICATION (  LDL):  <100     mg/dL   Optimal  354-656  mg/dL   Near or Above                    Optimal  130-159  mg/dL   Borderline  812-751  mg/dL   High  >700     mg/dL   Very High    See Psychiatric Specialty Exam and Suicide Risk Assessment completed by Attending Physician prior to discharge.  Discharge destination:  Home  Is patient on multiple antipsychotic therapies at discharge:  No   Has Patient had three or more failed trials of antipsychotic monotherapy by history:  No  Recommended Plan for Multiple Antipsychotic Therapies: NA   Allergies as of 12/27/2018      Reactions   Ketorolac Hives   Tramadol Hives      Medication List    STOP taking these medications   folic acid 1 MG tablet Commonly known as:  FOLVITE   nicotine 21 mg/24hr patch Commonly known as:  NICODERM CQ - dosed in mg/24 hours   ondansetron 4 MG disintegrating tablet Commonly known as:  Zofran ODT     TAKE these medications     Indication  acamprosate 333 MG tablet Commonly known as:  CAMPRAL Take 2 tablets (666 mg total) by mouth 3 (three) times daily with meals.  Indication:  Excessive Use of Alcohol   DULoxetine 60 MG capsule Commonly known as:  CYMBALTA Take 1 capsule (60 mg total) by mouth daily. Start taking on:  December 28, 2018 What changed:    medication strength  how much to take  additional instructions  Indication:  Major Depressive Disorder   gabapentin 400 MG capsule Commonly known as:  NEURONTIN Take 1 capsule (400 mg total) by mouth 3 (three) times daily. What changed:  additional instructions  Indication:  Abuse or Misuse of Alcohol, Alcohol Withdrawal Syndrome   hydrOXYzine 25 MG tablet Commonly known as:  ATARAX/VISTARIL Take 1 tablet (25 mg total) by mouth 2 (two) times daily as  needed for anxiety. What changed:    when to take this  reasons to take this  additional instructions  Indication:  Feeling Anxious   hydrOXYzine 50 MG tablet Commonly known as:  ATARAX/VISTARIL Take 1 tablet (50 mg total) by mouth at bedtime as needed (insomnia). What changed:  You were already taking a medication with the same name, and this prescription was added. Make sure you understand how and when to take each.  Indication:  Feeling Anxious   levETIRAcetam 500 MG tablet Commonly known as:  KEPPRA Take 1 tablet (500 mg total) by mouth 2 (two) times daily for 30 days.  Indication:  Per PCP   multivitamin with minerals Tabs tablet Take 1 tablet by mouth daily for 30 days.  Indication:  Per PCP   pantoprazole 40 MG tablet Commonly known as:  PROTONIX Take 1 tablet (40 mg total) by mouth daily. Start taking on:  December 28, 2018 What changed:    medication strength  how much to take  additional instructions  Indication:  Indigestion, Gastroesophageal Reflux Disease   thiamine 100 MG tablet Take 1 tablet (100 mg total) by mouth daily for 30 days.  Indication:  Per PCP      Follow-up Information    [facility]. Go on 12/27/2018.   Why:  Your intake appointment is at 1:00pm on 12/27/2018. Referral faxed 03/02. Auth # [id number]  [facility] [address] Contact information: [address] [place] [address] [phone]           Follow-up recommendations: Continue activity as tolerated. Continue diet as recommended by your PCP. Ensure to keep all appointments with outpatient providers.    Comments: Patient is instructed prior to discharge to: Take all medications as prescribed by his/her mental healthcare provider. Report any adverse effects and or reactions from the medicines to his/her outpatient provider promptly. Patient has been instructed & cautioned: To not engage in alcohol and or illegal drug use while on prescription medicines. In  the event of worsening symptoms, patient is instructed to call the crisis hotline, 911 and or go to the nearest ED for appropriate evaluation and treatment of symptoms. To follow-up with his/her primary care provider for your other medical issues, concerns and or health care needs.   Signed: [name] Narayan Scull, FNP 12/27/2018, 9:03 AM

## 2018-12-27 NOTE — Progress Notes (Signed)
Discharge Note:  Patient discharged with taxi voucher.  Patient denied SI and HI.  Denied A/V hallucinations.  Suicide prevention information given and discussed with patient who stated she understood and had no questions.  My3 app also given to patient.  Patient stated she received all her belongings, clothing, toiletries, misc items, etc.  Patient stated she appreciated all assistance received from [facility] staff.  All required discharge information given to patient.  Survey completed by patient and put in appropriate box.

## 2019-01-05 ENCOUNTER — Ambulatory Visit (HOSPITAL_COMMUNITY)
Admission: EM | Admit: 2019-01-05 | Discharge: 2019-01-05 | Disposition: A | Discharge status: Home / Self Care | Payer: Medicaid Other | Attending: Family Medicine | Admitting: Family Medicine

## 2019-01-05 ENCOUNTER — Other Ambulatory Visit: Payer: Self-pay

## 2019-01-05 ENCOUNTER — Encounter (HOSPITAL_COMMUNITY): Payer: Self-pay

## 2019-01-05 DIAGNOSIS — M545 Low back pain, unspecified: Secondary | ICD-10-CM

## 2019-01-05 DIAGNOSIS — M542 Cervicalgia: Secondary | ICD-10-CM

## 2019-01-05 DIAGNOSIS — G8929 Other chronic pain: Secondary | ICD-10-CM

## 2019-01-05 MED ORDER — PREDNISONE 20 MG PO TABS: ORAL_TABLET | ORAL | 0 refills | Status: DC

## 2019-01-05 MED ORDER — CYCLOBENZAPRINE HCL 5 MG PO TABS: 5.0000 mg | ORAL_TABLET | Freq: Every day | ORAL | 0 refills | Status: DC

## 2019-01-05 MED ORDER — FLUOXETINE HCL 20 MG PO TABS: 20.0000 mg | ORAL_TABLET | Freq: Every day | ORAL | 4 refills | Status: DC

## 2019-01-05 MED ORDER — GABAPENTIN 400 MG PO CAPS: 400.0000 mg | ORAL_CAPSULE | Freq: Three times a day (TID) | ORAL | 0 refills | Status: DC

## 2019-01-05 NOTE — Discharge Instructions (Addendum)
Call the orthopedic office listed below for help with your back pain

## 2019-01-05 NOTE — ED Provider Notes (Signed)
[facility]    CSN: [id number] Arrival date & time: 01/05/19  1721     History   Chief Complaint Chief Complaint  Patient presents with  . Back Pain  . Neck Pain    HPI [name] is a 63 y.o. female.   This is a 63 year old woman who has multiple visits for alcohol intoxication.  She comes in today complaining about neck pain.  Patient presents to Urgent Care with complaints of acute exacerbation of chronic neck and lower back pain, worse since 5 days ago. Patient states she has had multiple surgeries, she has been lifting objects recently and this has irritated her sore areas.    Patient's most recent surgery was done in [place] where a 14-hour procedure was done on her cervical spine followed by many months in a cervical collar.  She says she has neuropathy and takes Keppra and gabapentin for that.  She has had no new sensory changes or motor changes.  She complains of soreness in her back bilaterally as well as in her neck.  She is able to move her neck freely in all directions.  She has had no fever, cough, urinary symptoms or saddle anesthesia.  She states that in the past she has been given hydrocodone or Vicodin for her pain.  She does not have a primary care doctor.  She does not have a neurology consultant helping her.  Patient recently moved and said she had to lift a number of heavy boxes.  She is done moving now.  She does have a roommate.     Past Medical History:  Diagnosis Date  . Alcohol dependence (HCC)   . Anxiety   . Chronic pain   . Depression   . Herniated cervical disc     Patient Active Problem List   Diagnosis Date Noted  . Suicidal ideation   . Delirium tremens (HCC) 12/13/2018  . Thrombocytopenia (HCC) 12/13/2018  . Homeless 12/13/2018  . Depression 12/13/2018  . Neuropathy 12/13/2018  . Subarachnoid hemorrhage (HCC) 12/11/2018  . Seizure due to alcohol withdrawal (HCC) 12/11/2018  . Alcohol dependence with alcohol-induced  mood disorder (HCC)   . 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 Major depressive disorder, 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    Past Surgical History:  Procedure Laterality Date  . ABDOMINAL HYSTERECTOMY    . ANKLE SURGERY Right   . CARPAL TUNNEL RELEASE Bilateral   . CERVICAL FUSION    . CHOLECYSTECTOMY    . KNEE SURGERY      OB History   No obstetric history on file.      Home Medications    Prior to Admission medications   Medication Sig Start Date End Date Taking? Authorizing Provider  levETIRAcetam (KEPPRA) 500 MG tablet Take 1 tablet (500 mg total) by mouth 2 (two) times daily for 30 days. 12/27/18 01/26/19 Yes Money, [name], FNP  Multiple Vitamin (MULTIVITAMIN WITH MINERALS) TABS tablet Take 1 tablet by mouth daily for 30 days. 12/16/18 01/15/19 Yes Dahal, Melina Schools, MD  pantoprazole (PROTONIX) 40 MG tablet Take 1 tablet (40 mg total) by mouth daily. 12/28/18  Yes Money, [name], FNP  thiamine 100 MG tablet Take 1 tablet (100 mg total) by mouth daily for 30 days. 12/16/18 01/15/19 Yes Dahal, Melina Schools, MD  cyclobenzaprine (FLEXERIL) 5 MG tablet Take 1 tablet (5 mg total) by mouth at bedtime. 01/05/19   Elvina Sidle, MD  FLUoxetine (PROZAC) 20  MG tablet Take 1 tablet (20 mg total) by mouth daily. 01/05/19   Elvina Sidle, MD  gabapentin (NEURONTIN) 400 MG capsule Take 1 capsule (400 mg total) by mouth 3 (three) times daily. 01/05/19   Elvina Sidle, MD  predniSONE (DELTASONE) 20 MG tablet Two daily with food 01/05/19   Elvina Sidle, MD    Family History History reviewed. No pertinent family history.  Social History Social History   Tobacco Use  . Smoking status: Current Every Day Smoker    Packs/day: 0.50    Years: 7.00    Pack years: 3.50    Types: Cigarettes  . Smokeless tobacco: Never Used  Substance Use Topics  . Alcohol use: Yes    Alcohol/week: 3.0 standard drinks    Types: 3  Standard drinks or equivalent per week    Comment: daily use.  . Drug use: Yes    Types: Marijuana     Allergies   Ketorolac and Tramadol   Review of Systems Review of Systems  Musculoskeletal: Positive for back pain and neck pain.  All other systems reviewed and are negative.    Physical Exam Triage Vital Signs ED Triage Vitals [01/05/19 1733]  Enc Vitals Group     BP      Pulse      Resp      Temp      Temp src      SpO2      Weight 128 lb 15.5 oz (58.5 kg)     Height 5\' 1"  (1.549 m)     Head Circumference      Peak Flow      Pain Score 10     Pain Loc      Pain Edu?      Excl. in GC?    No data found.  Updated Vital Signs BP 129/85 (BP Location: Right Arm)   Pulse 82   Temp 98.1 F (36.7 C) (Oral)   Resp 18   Ht 5\' 1"  (1.549 m)   Wt 58.5 kg   SpO2 98%   BMI 24.37 kg/m    Physical Exam Vitals signs and nursing note reviewed.  Constitutional:      Appearance: Normal appearance.  HENT:     Head: Normocephalic and atraumatic.     Right Ear: External ear normal.     Left Ear: External ear normal.     Nose: Nose normal.     Mouth/Throat:     Mouth: Mucous membranes are moist.  Eyes:     Conjunctiva/sclera: Conjunctivae normal.  Neck:     Musculoskeletal: Normal range of motion and neck supple.  Cardiovascular:     Rate and Rhythm: Normal rate and regular rhythm.     Heart sounds: Normal heart sounds.  Pulmonary:     Effort: Pulmonary effort is normal.     Breath sounds: Normal breath sounds.  Musculoskeletal: Normal range of motion.     Comments: Patient moves her neck in all directions normally.  She is tender over the superior iliac crests posteriorly just below the belt line.  There is no deformity at the back and no surgical scars there.  Skin:    General: Skin is warm and dry.  Neurological:     General: No focal deficit present.     Mental Status: She is alert and oriented to person, place, and time.  Psychiatric:        Mood and  Affect: Mood normal.  Thought Content: Thought content normal.      UC Treatments / Results  Labs (all labs ordered are listed, but only abnormal results are displayed) Labs Reviewed - No data to display  EKG None  Radiology No results found.  Procedures Procedures (including critical care time)  Medications Ordered in UC Medications - No data to display  Initial Impression / Assessment and Plan / UC Course  I have reviewed the triage vital signs and the nursing notes.  Pertinent labs & imaging results that were available during my care of the patient were reviewed by me and considered in my medical decision making (see chart for details).    Final Clinical Impressions(s) / UC Diagnoses   Final diagnoses:  Chronic neck pain  Recurrent low back pain     Discharge Instructions     Call the orthopedic office listed below for help with your back pain    ED Prescriptions    Medication Sig Dispense Auth. Provider   cyclobenzaprine (FLEXERIL) 5 MG tablet Take 1 tablet (5 mg total) by mouth at bedtime. 7 tablet Elvina Sidle, MD   predniSONE (DELTASONE) 20 MG tablet Two daily with food 10 tablet Elvina Sidle, MD   gabapentin (NEURONTIN) 400 MG capsule Take 1 capsule (400 mg total) by mouth 3 (three) times daily. 90 capsule Elvina Sidle, MD   FLUoxetine (PROZAC) 20 MG tablet Take 1 tablet (20 mg total) by mouth daily. 30 tablet Elvina Sidle, MD     Controlled Substance Prescriptions [place] Controlled Substance Registry consulted? Not Applicable   Elvina Sidle, MD 01/05/19 1752

## 2019-01-05 NOTE — ED Triage Notes (Signed)
Patient presents to Urgent Care with complaints of acute exacerbation of chronic neck and lower back pain, worse since 5 days ago. Patient states she has had multiple surgeries, she has been lifting objects recently and this has irritated her sore areas.

## 2019-01-05 NOTE — ED Notes (Signed)
Patient verbalizes understanding of discharge instructions. Opportunity for questioning and answers were provided. Patient discharged from [facility] by MD. 

## 2019-01-08 ENCOUNTER — Encounter (HOSPITAL_COMMUNITY): Payer: Self-pay

## 2019-01-08 ENCOUNTER — Emergency Department (HOSPITAL_COMMUNITY)
Admission: EM | Admit: 2019-01-08 | Discharge: 2019-01-08 | Disposition: A | Discharge status: Home / Self Care | Payer: Medicaid Other | Attending: Emergency Medicine | Admitting: Emergency Medicine

## 2019-01-08 ENCOUNTER — Other Ambulatory Visit: Payer: Self-pay

## 2019-01-08 DIAGNOSIS — G8929 Other chronic pain: Secondary | ICD-10-CM | POA: Insufficient documentation

## 2019-01-08 DIAGNOSIS — M545 Low back pain, unspecified: Secondary | ICD-10-CM

## 2019-01-08 DIAGNOSIS — F17210 Nicotine dependence, cigarettes, uncomplicated: Secondary | ICD-10-CM | POA: Insufficient documentation

## 2019-01-08 DIAGNOSIS — G894 Chronic pain syndrome: Secondary | ICD-10-CM

## 2019-01-08 MED ORDER — HYDROMORPHONE HCL 1 MG/ML IJ SOLN
1.0000 mg | Freq: Once | INTRAMUSCULAR | Status: AC
  Administered 2019-01-08: 1 mg via INTRAMUSCULAR
  Filled 2019-01-08: qty 1

## 2019-01-08 MED ORDER — HYDROCODONE-ACETAMINOPHEN 5-325 MG PO TABS: 1.0000 | ORAL_TABLET | Freq: Four times a day (QID) | ORAL | 0 refills | Status: DC | PRN

## 2019-01-08 NOTE — ED Provider Notes (Signed)
[facility]-EMERGENCY DEPT Provider Note   CSN: [id number] Arrival date & time: 01/08/19  1157    History   Chief Complaint Chief Complaint  Patient presents with  . multiple pain sites    HPI [name] is a 63 y.o. female.     Patient presenting with a complaint of lumbar low back pain that started to become more severe just a few days ago.  Patient was seen at urgent care on March 19 predominantly for the neck pain at that time.  Patient states now the low back pain is worse however that was hurting at that time.  She was put on Flexeril and prednisone which she is taking but is not helping.  Patient does have a history of chronic pain problems.  Chronic pain herniated cervical disc.  Was admitted for depression February 28 but it was not for an overdose.  Patient has had some difficulty with alcohol dependence.  Patient denies any numbness or weakness to her feet.  But the pain does radiate into the buttocks area bilaterally.  But more so to the left.  Patient also with complaint of headache but that is unchanged her neck pain is also unchanged and that is more to her baseline chronic pain she states.     Past Medical History:  Diagnosis Date  . Alcohol dependence (HCC)   . Anxiety   . Chronic pain   . Depression   . Herniated cervical disc     Patient Active Problem List   Diagnosis Date Noted  . Suicidal ideation   . Delirium tremens (HCC) 12/13/2018  . Thrombocytopenia (HCC) 12/13/2018  . Homeless 12/13/2018  . Depression 12/13/2018  . Neuropathy 12/13/2018  . Subarachnoid hemorrhage (HCC) 12/11/2018  . Seizure due to alcohol withdrawal (HCC) 12/11/2018  . Alcohol dependence with alcohol-induced mood disorder (HCC)   . 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 Major depressive disorder, 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    Past Surgical History:  Procedure  Laterality Date  . ABDOMINAL HYSTERECTOMY    . ANKLE SURGERY Right   . CARPAL TUNNEL RELEASE Bilateral   . CERVICAL FUSION    . CHOLECYSTECTOMY    . KNEE SURGERY       OB History   No obstetric history on file.      Home Medications    Prior to Admission medications   Medication Sig Start Date End Date Taking? Authorizing Provider  cyclobenzaprine (FLEXERIL) 5 MG tablet Take 1 tablet (5 mg total) by mouth at bedtime. 01/05/19   Elvina Sidle, MD  FLUoxetine (PROZAC) 20 MG tablet Take 1 tablet (20 mg total) by mouth daily. 01/05/19   Elvina Sidle, MD  gabapentin (NEURONTIN) 400 MG capsule Take 1 capsule (400 mg total) by mouth 3 (three) times daily. 01/05/19   Elvina Sidle, MD  HYDROcodone-acetaminophen (NORCO/VICODIN) 5-325 MG tablet Take 1 tablet by mouth every 6 (six) hours as needed for moderate pain. 01/08/19   Vanetta Mulders, MD  levETIRAcetam (KEPPRA) 500 MG tablet Take 1 tablet (500 mg total) by mouth 2 (two) times daily for 30 days. 12/27/18 01/26/19  Money, [name], FNP  Multiple Vitamin (MULTIVITAMIN WITH MINERALS) TABS tablet Take 1 tablet by mouth daily for 30 days. 12/16/18 01/15/19  Lorin Glass, MD  pantoprazole (PROTONIX) 40 MG tablet Take 1 tablet (40 mg total) by mouth daily. 12/28/18   Money, [name], FNP  predniSONE (DELTASONE) 20 MG tablet  Two daily with food 01/05/19   Elvina Sidle, MD  thiamine 100 MG tablet Take 1 tablet (100 mg total) by mouth daily for 30 days. 12/16/18 01/15/19  Lorin Glass, MD    Family History History reviewed. No pertinent family history.  Social History Social History   Tobacco Use  . Smoking status: Current Every Day Smoker    Packs/day: 0.50    Years: 7.00    Pack years: 3.50    Types: Cigarettes  . Smokeless tobacco: Never Used  Substance Use Topics  . Alcohol use: Not Currently    Alcohol/week: 3.0 standard drinks    Types: 3 Standard drinks or equivalent per week    Comment: patient last drank 35 days ago  .  Drug use: Not Currently    Types: Marijuana     Allergies   Ketorolac and Tramadol   Review of Systems Review of Systems  Constitutional: Negative for chills and fever.  HENT: Negative for rhinorrhea and sore throat.   Eyes: Negative for visual disturbance.  Respiratory: Negative for cough and shortness of breath.   Cardiovascular: Negative for chest pain and leg swelling.  Gastrointestinal: Negative for abdominal pain, diarrhea, nausea and vomiting.  Genitourinary: Negative for dysuria and hematuria.  Musculoskeletal: Positive for back pain and neck pain.  Skin: Negative for rash.  Neurological: Positive for headaches. Negative for dizziness, weakness and light-headedness.  Hematological: Does not bruise/bleed easily.  Psychiatric/Behavioral: Negative for confusion.     Physical Exam Updated Vital Signs BP (!) 141/91 (BP Location: Right Arm)   Pulse 67   Temp 97.8 F (36.6 C) (Oral)   Resp 17   Ht 1.549 m ( )   Wt 61.2 kg   SpO2 98%   BMI 25.51 kg/m   Physical Exam Vitals signs and nursing note reviewed.  Constitutional:      General: She is not in acute distress.    Appearance: Normal appearance. She is well-developed. She is not toxic-appearing.  HENT:     Head: Normocephalic and atraumatic.     Nose: No congestion.     Mouth/Throat:     Mouth: Mucous membranes are moist.  Eyes:     Extraocular Movements: Extraocular movements intact.     Conjunctiva/sclera: Conjunctivae normal.     Pupils: Pupils are equal, round, and reactive to light.  Neck:     Musculoskeletal: Normal range of motion and neck supple.  Cardiovascular:     Rate and Rhythm: Normal rate and regular rhythm.     Heart sounds: Normal heart sounds. No murmur.  Pulmonary:     Effort: Pulmonary effort is normal. No respiratory distress.     Breath sounds: Normal breath sounds.  Abdominal:     General: Bowel sounds are normal.     Palpations: Abdomen is soft.     Tenderness: There is no  abdominal tenderness.  Musculoskeletal: Normal range of motion.     Right lower leg: No edema.     Left lower leg: No edema.     Comments: Lumbar area with no point tenderness.  Skin:    General: Skin is warm and dry.     Capillary Refill: Capillary refill takes less than 2 seconds.  Neurological:     General: No focal deficit present.     Mental Status: She is alert and oriented to person, place, and time.     Cranial Nerves: No cranial nerve deficit.     Sensory: No sensory deficit.  Motor: No weakness.      ED Treatments / Results  Labs (all labs ordered are listed, but only abnormal results are displayed) Labs Reviewed - No data to display  EKG None  Radiology No results found.  Procedures Procedures (including critical care time)  Medications Ordered in ED Medications  HYDROmorphone (DILAUDID) injection 1 mg (1 mg Intramuscular Given 01/08/19 1424)     Initial Impression / Assessment and Plan / ED Course  I have reviewed the triage vital signs and the nursing notes.  Pertinent labs & imaging results that were available during my care of the patient were reviewed by me and considered in my medical decision making (see chart for details).       Patient's narcotic database was screened.  Patient has not had any narcotics recently.  Patient does have a history of alcohol abuse.  But does not really have a history of narcotic abuse.  Does have a history of chronic pain but states that the low back pain suddenly got worse shortly prior to the visit by acute care clinic.  No fall or injury no neuro deficits of the lower extremities.  No palpable back pain along the lumbar area.  Patient currently taking prednisone and Flexeril.  Will add on a short course of hydrocodone to help with pain relief and give referral to orthopedics and referral to wellness clinic for temporary primary care.  Review of her admission on February 28 shows that it was for depression she did  voice some suicidal ideation at that time but there was no overdose attempt.  Patient denies any suicidal thoughts at this time.   Final Clinical Impressions(s) / ED Diagnoses   Final diagnoses:  Acute midline low back pain without sciatica  Chronic pain syndrome    ED Discharge Orders         Ordered    HYDROcodone-acetaminophen (NORCO/VICODIN) 5-325 MG tablet  Every 6 hours PRN     01/08/19 1506           Vanetta Mulders, MD 01/08/19 1516

## 2019-01-08 NOTE — ED Triage Notes (Signed)
Patient c/o having a seizure a month ago and is now having pain in her head, neck, hips, and legs. Patient states she has been taking aleve, Goody's and Tylenol with no relief.

## 2019-01-08 NOTE — Discharge Instructions (Addendum)
Take the hydrocodone short course to try to help with the back pain.  Continue to take the prednisone is provided by the urgent care.  Follow-up with the wellness clinic as a primary care doctor while you are seeking a permanent primary care doctor.  Also make a call to orthopedics to follow-up for the back pain.

## 2019-01-08 NOTE — ED Notes (Signed)
ED Provider at bedside. 

## 2019-01-10 ENCOUNTER — Emergency Department (HOSPITAL_COMMUNITY)
Admission: EM | Admit: 2019-01-10 | Discharge: 2019-01-10 | Disposition: A | Discharge status: Home / Self Care | Payer: Medicaid Other | Attending: Emergency Medicine | Admitting: Emergency Medicine

## 2019-01-10 ENCOUNTER — Encounter (HOSPITAL_COMMUNITY): Payer: Self-pay | Admitting: Emergency Medicine

## 2019-01-10 ENCOUNTER — Other Ambulatory Visit: Payer: Self-pay

## 2019-01-10 DIAGNOSIS — M545 Low back pain: Secondary | ICD-10-CM | POA: Diagnosis present

## 2019-01-10 DIAGNOSIS — M5416 Radiculopathy, lumbar region: Secondary | ICD-10-CM | POA: Diagnosis not present

## 2019-01-10 DIAGNOSIS — Z79899 Other long term (current) drug therapy: Secondary | ICD-10-CM | POA: Diagnosis not present

## 2019-01-10 DIAGNOSIS — Z590 Homelessness: Secondary | ICD-10-CM | POA: Insufficient documentation

## 2019-01-10 DIAGNOSIS — F17210 Nicotine dependence, cigarettes, uncomplicated: Secondary | ICD-10-CM | POA: Diagnosis not present

## 2019-01-10 MED ORDER — HYDROMORPHONE HCL 1 MG/ML IJ SOLN
1.0000 mg | Freq: Once | INTRAMUSCULAR | Status: AC
  Administered 2019-01-10: 1 mg via INTRAMUSCULAR

## 2019-01-10 MED ORDER — HYDROCODONE-ACETAMINOPHEN 5-325 MG PO TABS: 1.0000 | ORAL_TABLET | Freq: Four times a day (QID) | ORAL | 0 refills | Status: DC | PRN

## 2019-01-10 MED ORDER — HYDROMORPHONE HCL 1 MG/ML IJ SOLN
1.0000 mg | Freq: Once | INTRAMUSCULAR | Status: DC
  Filled 2019-01-10: qty 1

## 2019-01-10 MED ORDER — ONDANSETRON 4 MG PO TBDP
4.0000 mg | ORAL_TABLET | Freq: Once | ORAL | Status: AC
  Administered 2019-01-10: 4 mg via ORAL
  Filled 2019-01-10: qty 1

## 2019-01-10 NOTE — ED Triage Notes (Addendum)
Reports chronic back pain with this episode lasting 4 days.  Has appt with orthopedist on Thursday. Seen at [facility] for same 2 days ago.  States they did not give her enough pain medication to last her.

## 2019-01-10 NOTE — ED Provider Notes (Signed)
[facility] EMERGENCY DEPARTMENT Provider Note   CSN: [id number] Arrival date & time: 01/10/19  1416    History   Chief Complaint Chief Complaint  Patient presents with  . Back Pain    HPI [name] is a 63 y.o. female.     The history is provided by the patient.  Back Pain  Location:  Lumbar spine Quality:  Aching, shooting, stabbing and stiffness Stiffness is present:  All day Radiates to:  R posterior upper leg, R knee and R foot Pain severity:  Severe Pain is:  Same all the time Onset quality:  Gradual Duration:  1 week Timing:  Constant Progression:  Unchanged Chronicity:  Recurrent Context comment:  Moved to a new home prior to the bad pain starting and is not supposed to lift but did that day while moving. Relieved by:  Nothing Worsened by:  Bending, ambulation and movement Ineffective treatments: flexeril, aleve, prednisone. Associated symptoms: leg pain and tingling   Associated symptoms: no abdominal pain, no abdominal swelling, no bladder incontinence, no bowel incontinence, no dysuria, no fever, no numbness, no pelvic pain and no weakness   Risk factors comment:  Hx of chronic neck pain and fusion, known DDD in the lower back   Past Medical History:  Diagnosis Date  . Alcohol dependence (HCC)   . Anxiety   . Chronic pain   . Depression   . Herniated cervical disc     Patient Active Problem List   Diagnosis Date Noted  . Suicidal ideation   . Delirium tremens (HCC) 12/13/2018  . Thrombocytopenia (HCC) 12/13/2018  . Homeless 12/13/2018  . Depression 12/13/2018  . Neuropathy 12/13/2018  . Subarachnoid hemorrhage (HCC) 12/11/2018  . Seizure due to alcohol withdrawal (HCC) 12/11/2018  . Alcohol dependence with alcohol-induced mood disorder (HCC)   . 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 Major depressive disorder, 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     Past Surgical History:  Procedure Laterality Date  . ABDOMINAL HYSTERECTOMY    . ANKLE SURGERY Right   . CARPAL TUNNEL RELEASE Bilateral   . CERVICAL FUSION    . CHOLECYSTECTOMY    . KNEE SURGERY       OB History   No obstetric history on file.      Home Medications    Prior to Admission medications   Medication Sig Start Date End Date Taking? Authorizing Provider  cyclobenzaprine (FLEXERIL) 5 MG tablet Take 1 tablet (5 mg total) by mouth at bedtime. 01/05/19   Elvina Sidle, MD  FLUoxetine (PROZAC) 20 MG tablet Take 1 tablet (20 mg total) by mouth daily. 01/05/19   Elvina Sidle, MD  gabapentin (NEURONTIN) 400 MG capsule Take 1 capsule (400 mg total) by mouth 3 (three) times daily. 01/05/19   Elvina Sidle, MD  HYDROcodone-acetaminophen (NORCO/VICODIN) 5-325 MG tablet Take 1 tablet by mouth every 6 (six) hours as needed for moderate pain. 01/08/19   Vanetta Mulders, MD  levETIRAcetam (KEPPRA) 500 MG tablet Take 1 tablet (500 mg total) by mouth 2 (two) times daily for 30 days. 12/27/18 01/26/19  Money, [name], FNP  Multiple Vitamin (MULTIVITAMIN WITH MINERALS) TABS tablet Take 1 tablet by mouth daily for 30 days. 12/16/18 01/15/19  Lorin Glass, MD  pantoprazole (PROTONIX) 40 MG tablet Take 1 tablet (40 mg total) by mouth daily. 12/28/18   Money, [name], FNP  predniSONE (DELTASONE) 20 MG tablet Two daily with food 01/05/19  Elvina SidleLauenstein, Kurt, MD  thiamine 100 MG tablet Take 1 tablet (100 mg total) by mouth daily for 30 days. 12/16/18 01/15/19  Lorin Glassahal, Binaya, MD    Family History History reviewed. No pertinent family history.  Social History Social History   Tobacco Use  . Smoking status: Current Every Day Smoker    Packs/day: 0.50    Years: 7.00    Pack years: 3.50    Types: Cigarettes  . Smokeless tobacco: Never Used  Substance Use Topics  . Alcohol use: Not Currently    Alcohol/week: 3.0 standard drinks    Types: 3 Standard drinks or equivalent per week     Comment: patient last drank 35 days ago  . Drug use: Not Currently    Types: Marijuana     Allergies   Ketorolac and Tramadol   Review of Systems Review of Systems  Constitutional: Negative for fever.  Gastrointestinal: Negative for abdominal pain and bowel incontinence.  Genitourinary: Negative for bladder incontinence, dysuria and pelvic pain.  Musculoskeletal: Positive for back pain.  Neurological: Positive for tingling. Negative for weakness and numbness.  All other systems reviewed and are negative.    Physical Exam Updated Vital Signs BP (!) 136/107 (BP Location: Right Arm)   Pulse (!) 101   Temp 98.6 F (37 C) (Oral)   Resp 16   Ht 5\' 1"  (1.549 m)   Wt 61.2 kg   SpO2 99%   BMI 25.51 kg/m   Physical Exam Vitals signs and nursing note reviewed.  Constitutional:      General: She is not in acute distress.    Appearance: She is well-developed.  HENT:     Head: Normocephalic and atraumatic.  Eyes:     Pupils: Pupils are equal, round, and reactive to light.  Neck:     Musculoskeletal: Normal range of motion and neck supple. Normal range of motion. No neck rigidity, spinous process tenderness or muscular tenderness.  Cardiovascular:     Rate and Rhythm: Normal rate and regular rhythm.     Heart sounds: Normal heart sounds. No murmur.  Pulmonary:     Effort: Pulmonary effort is normal.     Breath sounds: Normal breath sounds. No wheezing or rales.  Abdominal:     General: There is no distension.     Palpations: Abdomen is soft.     Tenderness: There is no abdominal tenderness.  Musculoskeletal:        General: No tenderness.     Lumbar back: She exhibits decreased range of motion and pain. She exhibits no bony tenderness, no swelling, no deformity and normal pulse.       Back:     Right lower leg: No edema.     Left lower leg: No edema.  Skin:    General: Skin is warm and dry.     Findings: No rash.  Neurological:     General: No focal deficit present.      Mental Status: She is alert and oriented to person, place, and time.     Coordination: Coordination normal.     Deep Tendon Reflexes:     Reflex Scores:      Patellar reflexes are 1+ on the right side and 1+ on the left side.    Comments: 5/5 strength in bilateral lower ext.  Mild decreased sensation subjectively on the right lower ext.  Normal color and DP pulse  Psychiatric:        Mood and Affect: Mood normal.  Thought Content: Thought content normal.      ED Treatments / Results  Labs (all labs ordered are listed, but only abnormal results are displayed) Labs Reviewed - No data to display  EKG None  Radiology No results found.  Procedures Procedures (including critical care time)  Medications Ordered in ED Medications  HYDROmorphone (DILAUDID) injection 1 mg (has no administration in time range)  ondansetron (ZOFRAN-ODT) disintegrating tablet 4 mg (has no administration in time range)     Initial Impression / Assessment and Plan / ED Course  I have reviewed the triage vital signs and the nursing notes.  Pertinent labs & imaging results that were available during my care of the patient were reviewed by me and considered in my medical decision making (see chart for details).       Pt with gradual onset of back pain suggestive of radiculopathy.  No neurovascular compromise and no incontinence.  Pt has no infectious sx, hx of CA  or other red flags concerning for pathologic back pain.  Pt is able to ambulate but is painful.  Normal strength and reflexes on exam.  Denies trauma. Will give pt pain control and to return for developement of above sx.  Pt has appointment with Dr. Betha Loa on Thursday and out of pain meds from the ER from Sunday.  Pt does not belong to pain clinic and not routinely on pain killers.  Will give ppx for 2 more days until she sees the orthopeadist.     Final Clinical Impressions(s) / ED Diagnoses   Final diagnoses:  Lumbar  radiculopathy, acute    ED Discharge Orders         Ordered    HYDROcodone-acetaminophen (NORCO/VICODIN) 5-325 MG tablet  Every 6 hours PRN     03 /24/20 1441           Gwyneth Sprout, MD 01/10/19 1443

## 2019-01-10 NOTE — Discharge Instructions (Addendum)
Continue to use aleve or ibuprofen as well as pain medication

## 2019-01-12 ENCOUNTER — Other Ambulatory Visit: Payer: Self-pay

## 2019-01-12 ENCOUNTER — Emergency Department (HOSPITAL_COMMUNITY)
Admission: EM | Admit: 2019-01-12 | Discharge: 2019-01-12 | Disposition: A | Discharge status: Home / Self Care | Payer: Medicaid Other | Attending: Emergency Medicine | Admitting: Emergency Medicine

## 2019-01-12 ENCOUNTER — Encounter (HOSPITAL_COMMUNITY): Payer: Self-pay | Admitting: *Deleted

## 2019-01-12 DIAGNOSIS — M545 Low back pain: Secondary | ICD-10-CM | POA: Diagnosis present

## 2019-01-12 DIAGNOSIS — Z79899 Other long term (current) drug therapy: Secondary | ICD-10-CM | POA: Insufficient documentation

## 2019-01-12 DIAGNOSIS — M5441 Lumbago with sciatica, right side: Secondary | ICD-10-CM | POA: Diagnosis not present

## 2019-01-12 DIAGNOSIS — G8929 Other chronic pain: Secondary | ICD-10-CM | POA: Insufficient documentation

## 2019-01-12 DIAGNOSIS — F17210 Nicotine dependence, cigarettes, uncomplicated: Secondary | ICD-10-CM | POA: Diagnosis not present

## 2019-01-12 MED ORDER — LIDOCAINE 5 % EX PTCH
1.0000 | MEDICATED_PATCH | CUTANEOUS | Status: DC
  Administered 2019-01-12: 1 via TRANSDERMAL
  Filled 2019-01-12: qty 1

## 2019-01-12 MED ORDER — HYDROCODONE-ACETAMINOPHEN 5-325 MG PO TABS
1.0000 | ORAL_TABLET | Freq: Once | ORAL | Status: AC
  Administered 2019-01-12: 1 via ORAL
  Filled 2019-01-12: qty 1

## 2019-01-12 NOTE — ED Notes (Signed)
Pt unahappy about care received. Gave pt experience paperwork. Pt has orthopedic appt today at 1415. Was given pain medicine here

## 2019-01-12 NOTE — ED Provider Notes (Signed)
[facility] EMERGENCY DEPARTMENT Provider Note   CSN: [id number] Arrival date & time: 01/12/19  1118    History   Chief Complaint Chief Complaint  Patient presents with  . Back Pain    HPI [name] is a 63 y.o. female.     The history is provided by the patient and medical records. No language interpreter was used.  Back Pain  Associated symptoms: no fever and no numbness      63 year old female with history of chronic back pain, alcohol abuse, anxiety presenting for evaluation of back pain.  Patient states she performed some heavy lifting approximately a week ago and since then she has had persistent worsening back pain.  She also report been diagnosed with early signs of degenerative disc disease.  She states that she has chronic back pain.  She denies having fever chills bowel bladder incontinence or saddle anesthesia.  She mentioned she is able to ambulate.  This is patient's fourth visits for similar complaint.  Patient initially seen at her PCPs office 1 week ago.  Patient received Flexeril, prednisone, gabapentin, and Prozac.  She was seen 3 days later for the same complaint.  Subsequently discharged home with a prescription for hydrocodone.  2 days after that she was seen in the ED again and once again was also prescribed another short course of hydrocodone after receiving pain medication in the ED which includes Dilaudid.  Past Medical History:  Diagnosis Date  . Alcohol dependence (HCC)   . Anxiety   . Chronic pain   . Depression   . Herniated cervical disc     Patient Active Problem List   Diagnosis Date Noted  . Suicidal ideation   . Delirium tremens (HCC) 12/13/2018  . Thrombocytopenia (HCC) 12/13/2018  . Homeless 12/13/2018  . Depression 12/13/2018  . Neuropathy 12/13/2018  . Subarachnoid hemorrhage (HCC) 12/11/2018  . Seizure due to alcohol withdrawal (HCC) 12/11/2018  . Alcohol dependence with alcohol-induced mood disorder (HCC)    . 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 Major depressive disorder, 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    Past Surgical History:  Procedure Laterality Date  . ABDOMINAL HYSTERECTOMY    . ANKLE SURGERY Right   . CARPAL TUNNEL RELEASE Bilateral   . CERVICAL FUSION    . CHOLECYSTECTOMY    . KNEE SURGERY       OB History   No obstetric history on file.      Home Medications    Prior to Admission medications   Medication Sig Start Date End Date Taking? Authorizing Provider  cyclobenzaprine (FLEXERIL) 5 MG tablet Take 1 tablet (5 mg total) by mouth at bedtime. 01/05/19   Elvina Sidle, MD  FLUoxetine (PROZAC) 20 MG tablet Take 1 tablet (20 mg total) by mouth daily. 01/05/19   Elvina Sidle, MD  gabapentin (NEURONTIN) 400 MG capsule Take 1 capsule (400 mg total) by mouth 3 (three) times daily. 01/05/19   Elvina Sidle, MD  HYDROcodone-acetaminophen (NORCO/VICODIN) 5-325 MG tablet Take 1 tablet by mouth every 6 (six) hours as needed for moderate pain. 01/10/19   Gwyneth Sprout, MD  levETIRAcetam (KEPPRA) 500 MG tablet Take 1 tablet (500 mg total) by mouth 2 (two) times daily for 30 days. 12/27/18 01/26/19  Money, [name], FNP  Multiple Vitamin (MULTIVITAMIN WITH MINERALS) TABS tablet Take 1 tablet by mouth daily for 30 days. 12/16/18 01/15/19  Lorin Glass, MD  pantoprazole (PROTONIX) 40 MG  tablet Take 1 tablet (40 mg total) by mouth daily. 12/28/18   Money, [name], FNP  predniSONE (DELTASONE) 20 MG tablet Two daily with food 01/05/19   Elvina Sidle, MD  thiamine 100 MG tablet Take 1 tablet (100 mg total) by mouth daily for 30 days. 12/16/18 01/15/19  Lorin Glass, MD    Family History No family history on file.  Social History Social History   Tobacco Use  . Smoking status: Current Every Day Smoker    Packs/day: 0.50    Years: 7.00    Pack years: 3.50    Types: Cigarettes  . Smokeless  tobacco: Never Used  Substance Use Topics  . Alcohol use: Not Currently    Alcohol/week: 3.0 standard drinks    Types: 3 Standard drinks or equivalent per week    Comment: patient last drank 35 days ago  . Drug use: Not Currently    Types: Marijuana     Allergies   Ketorolac and Tramadol   Review of Systems Review of Systems  Constitutional: Negative for fever.  Musculoskeletal: Positive for back pain.  Neurological: Negative for numbness.     Physical Exam Updated Vital Signs There were no vitals taken for this visit.  Physical Exam Vitals signs and nursing note reviewed.  Constitutional:      General: She is not in acute distress.    Appearance: She is well-developed.  HENT:     Head: Atraumatic.  Eyes:     Conjunctiva/sclera: Conjunctivae normal.  Neck:     Musculoskeletal: Neck supple.  Abdominal:     Palpations: Abdomen is soft.     Tenderness: There is no abdominal tenderness.  Musculoskeletal:        General: Tenderness (Back: Tenderness to lumbar spine and paralumbar spinal muscle with full range of motion and no overlying skin changes.  Normal hip flexion extension bilaterally.) present.  Skin:    Capillary Refill: Capillary refill takes less than 2 seconds.     Findings: No rash.  Neurological:     Mental Status: She is alert.     Comments: Able to ambulate.  Patellar deep tendon reflex intact bilaterally      ED Treatments / Results  Labs (all labs ordered are listed, but only abnormal results are displayed) Labs Reviewed - No data to display  EKG None  Radiology No results found.  Procedures Procedures (including critical care time)  Medications Ordered in ED Medications  lidocaine (LIDODERM) 5 % 1 patch (1 patch Transdermal Patch Applied 01/12/19 1144)  HYDROcodone-acetaminophen (NORCO/VICODIN) 5-325 MG per tablet 1 tablet (1 tablet Oral Given 01/12/19 1143)     Initial Impression / Assessment and Plan / ED Course  I have reviewed  the triage vital signs and the nursing notes.  Pertinent labs & imaging results that were available during my care of the patient were reviewed by me and considered in my medical decision making (see chart for details).        BP (!) 116/92 (BP Location: Right Arm)   Pulse 89   Temp 98 F (36.7 C) (Oral)   Resp 18   Ht  (1.549 m)   Wt 61.2 kg   SpO2 100%   BMI 25.49 kg/m    Final Clinical Impressions(s) / ED Diagnoses   Final diagnoses:  Chronic right-sided low back pain with right-sided sciatica    ED Discharge Orders    None     11:42 AM Patient here with low back  pain likely radicular pain.  This is ongoing for the past week.  This is her fourth ER visits for the same complaint.  She does have an appointment with orthopedic in 3 hours.  She is here requesting for pain control.  She does not have any red flags.  I offered x-ray patient declined.  Will provide Lidoderm patch and 1 hydrocodone for pain.  Encourage patient to follow-up with orthopedist for further care.   Fayrene Helper, PA-C 01/12/19 1203    Cathren Laine, MD 01/12/19 1440

## 2019-01-12 NOTE — ED Triage Notes (Signed)
Pt reports sever back pain . Pt has an appt today but pain feels like a knife in her back.

## 2019-01-12 NOTE — Discharge Instructions (Signed)
Please follow up with your orthopedist today at 2:15 as previously scheduled.

## 2019-01-16 NOTE — Progress Notes (Signed)
Patient ID: [name], female   DOB: 1956/10/03, 63 y.o.   MRN: [id number]     [name], is a 63 y.o. female  GYB:[id number]  AJG:[id number]  DOB - 23-May-1956  Subjective:  Chief Complaint and HPI: [name] is a 63 y.o. female here today to establish care and for a follow up visit After being seen in the ED multiple times; most recently 01/12/2019. H/o C-spine fusion.  +LBP and R leg radiculopathy.  This is not new.  She was previously managed by pain clinic.  She would like a referral.  Says Bp is normal when she is not in pain.  Hydrocodone helps some with pain.  Sober from alcohol almost 2 months.  No urinary s/sx.    From ED note: 63 year old female with history of chronic back pain, alcohol abuse, anxiety presenting for evaluation of back pain.  Patient states she performed some heavy lifting approximately a week ago and since then she has had persistent worsening back pain.  She also report been diagnosed with early signs of degenerative disc disease.  She states that she has chronic back pain.  She denies having fever chills bowel bladder incontinence or saddle anesthesia.  She mentioned she is able to ambulate.  This is patient's fourth visits for similar complaint.  Patient initially seen at her PCPs office 1 week ago.  Patient received Flexeril, prednisone, gabapentin, and Prozac.  She was seen 3 days later for the same complaint.  Subsequently discharged home with a prescription for hydrocodone.  2 days after that she was seen in the ED again and once again was also prescribed another short course of hydrocodone after receiving pain medication in the ED which includes Dilaudid.  From A/P: Patient here with low back pain likely radicular pain.  This is ongoing for the past week.  This is her fourth ER visits for the same complaint.  She does have an appointment with orthopedic in 3 hours.  She is here requesting for pain control.  She does not have any red flags.  I  offered x-ray patient declined.  Will provide Lidoderm patch and 1 hydrocodone for pain.  Encourage patient to follow-up with orthopedist for further care  ED/Hospital notes reviewed.     ROS:   Constitutional:  No f/c, No night sweats, No unexplained weight loss. EENT:  No vision changes, No blurry vision, No hearing changes. No mouth, throat, or ear problems.  Respiratory: No cough, No SOB Cardiac: No CP, no palpitations GI:  No abd pain, No N/V/D. GU: No Urinary s/sx Musculoskeletal: chronic neck and lower back Neuro: No headache, no dizziness, no motor weakness.  Skin: No rash Endocrine:  No polydipsia. No polyuria.  Psych: Denies SI/HI  No problems updated.  ALLERGIES: Allergies  Allergen Reactions   Ketorolac Hives   Tramadol Hives    PAST MEDICAL HISTORY: Past Medical History:  Diagnosis Date   Alcohol dependence (HCC)    Anxiety    Chronic pain    Depression    Herniated cervical disc     MEDICATIONS AT HOME: Prior to Admission medications   Medication Sig Start Date End Date Taking? Authorizing Provider  acetaminophen-codeine (TYLENOL #3) 300-30 MG tablet Take 1 tablet by mouth every 4 (four) hours as needed for moderate pain. 01/18/19   Anders Simmonds, PA-C  cyclobenzaprine (FLEXERIL) 5 MG tablet Take 1 tablet (5 mg total) by mouth at bedtime. 01/05/19   Elvina Sidle, MD  FLUoxetine (PROZAC) 20 MG tablet  Take 1 tablet (20 mg total) by mouth daily. 01/05/19   Elvina Sidle, MD  gabapentin (NEURONTIN) 400 MG capsule Take 1 capsule (400 mg total) by mouth 3 (three) times daily. 01/05/19   Elvina Sidle, MD  HYDROcodone-acetaminophen (NORCO/VICODIN) 5-325 MG tablet Take 1 tablet by mouth every 6 (six) hours as needed for moderate pain. 01/10/19   Gwyneth Sprout, MD  levETIRAcetam (KEPPRA) 500 MG tablet Take 1 tablet (500 mg total) by mouth 2 (two) times daily for 30 days. 12/27/18 01/26/19  Money, [name], FNP  methocarbamol (ROBAXIN) 500 MG tablet  Take 2 tablets (1,000 mg total) by mouth every 8 (eight) hours as needed for muscle spasms. 01/18/19   Anders Simmonds, PA-C  pantoprazole (PROTONIX) 40 MG tablet Take 1 tablet (40 mg total) by mouth daily. 12/28/18   Money, [name], FNP     Objective:  EXAM:   Vitals:   01/18/19 1107  BP: (!) 147/97  Pulse: 82  Resp: 16  Temp: 98 F (36.7 C)  TempSrc: Oral  SpO2: 100%  Weight: 134 lb 6.4 oz (61 kg)  Height:  (1.549 m)    General appearance : A&OX3. NAD. Non-toxic-appearing HEENT: Atraumatic and Normocephalic.  PERRLA. EOM intact.   Neck: supple, no JVD. No cervical lymphadenopathy. No thyromegaly Chest/Lungs:  Breathing-non-labored, Good air entry bilaterally, breath sounds normal without rales, rhonchi, or wheezing  CVS: S1 S2 regular, no murmurs, gallops, rubs  Extremities: Bilateral Lower Ext shows no edema, both legs are warm to touch with = pulse throughout Neurology:  CN II-XII grossly intact, Non focal.   Psych:  TP linear. J/I WNL. Normal speech. Appropriate eye contact and affect.  Skin:  No Rash  Data Review Lab Results  Component Value Date   HGBA1C  10/23/2009    5.4 (NOTE) The ADA recommends the following therapeutic goal for glycemic control related to Hgb A1c measurement: Goal of therapy: <6.5 Hgb A1c  Reference: American Diabetes Association: Clinical Practice Recommendations 2010, Diabetes Care, 2010, 33: (Suppl  1).     Assessment & Plan   1. Degenerative disc disease, cervical No red flags - Ambulatory referral to Pain Clinic - methocarbamol (ROBAXIN) 500 MG tablet; Take 2 tablets (1,000 mg total) by mouth every 8 (eight) hours as needed for muscle spasms.  Dispense: 90 tablet; Refill: 1 - acetaminophen-codeine (TYLENOL #3) 300-30 MG tablet; Take 1 tablet by mouth every 4 (four) hours as needed for moderate pain.  Dispense: 20 tablet; Refill: 0  2. Degenerative disc disease, lumbar No red flags - Ambulatory referral to Pain Clinic -  methocarbamol (ROBAXIN) 500 MG tablet; Take 2 tablets (1,000 mg total) by mouth every 8 (eight) hours as needed for muscle spasms.  Dispense: 90 tablet; Refill: 1 - acetaminophen-codeine (TYLENOL #3) 300-30 MG tablet; Take 1 tablet by mouth every 4 (four) hours as needed for moderate pain.  Dispense: 20 tablet; Refill: 0  3. Alcohol dependence with alcohol-induced mood disorder (HCC) Sober X 2 months-congratulated and keep up cessation  4. Radiculopathy, unspecified spinal region - Ambulatory referral to Pain Clinic - methocarbamol (ROBAXIN) 500 MG tablet; Take 2 tablets (1,000 mg total) by mouth every 8 (eight) hours as needed for muscle spasms.  Dispense: 90 tablet; Refill: 1 - acetaminophen-codeine (TYLENOL #3) 300-30 MG tablet; Take 1 tablet by mouth every 4 (four) hours as needed for moderate pain.  Dispense: 20 tablet; Refill: 0  5. Elevated BP without diagnosis of hypertension Check BP OOO and record 3-5  times/week   6. Encounter for examination following treatment at hospital No change  Patient have been counseled extensively about nutrition and exercise  Return in about 2 months (around 03/20/2019) for assign PCP;  f/up BP.  The patient was given clear instructions to go to ER or return to medical center if symptoms don't improve, worsen or new problems develop. The patient verbalized understanding. The patient was told to call to get lab results if they haven't heard anything in the next week.     Georgian Co, PA-C [facility] [place], [place] [phone]   01/18/2019, 11:36 AM

## 2019-01-18 ENCOUNTER — Ambulatory Visit: Payer: Medicaid Other | Attending: Family Medicine | Admitting: Physician Assistant

## 2019-01-18 ENCOUNTER — Other Ambulatory Visit: Payer: Self-pay

## 2019-01-18 VITALS — BP 147/97 | HR 82 | Temp 98.0°F | Resp 16 | Ht 61.0 in | Wt 134.4 lb

## 2019-01-18 DIAGNOSIS — F1024 Alcohol dependence with alcohol-induced mood disorder: Secondary | ICD-10-CM | POA: Diagnosis not present

## 2019-01-18 DIAGNOSIS — G8929 Other chronic pain: Secondary | ICD-10-CM | POA: Insufficient documentation

## 2019-01-18 DIAGNOSIS — Z79899 Other long term (current) drug therapy: Secondary | ICD-10-CM | POA: Insufficient documentation

## 2019-01-18 DIAGNOSIS — M5136 Other intervertebral disc degeneration, lumbar region: Secondary | ICD-10-CM | POA: Diagnosis not present

## 2019-01-18 DIAGNOSIS — M5030 Other cervical disc degeneration, unspecified cervical region: Secondary | ICD-10-CM | POA: Insufficient documentation

## 2019-01-18 DIAGNOSIS — Z09 Encounter for follow-up examination after completed treatment for conditions other than malignant neoplasm: Secondary | ICD-10-CM

## 2019-01-18 DIAGNOSIS — M5410 Radiculopathy, site unspecified: Secondary | ICD-10-CM

## 2019-01-18 DIAGNOSIS — M5116 Intervertebral disc disorders with radiculopathy, lumbar region: Secondary | ICD-10-CM | POA: Insufficient documentation

## 2019-01-18 DIAGNOSIS — F329 Major depressive disorder, single episode, unspecified: Secondary | ICD-10-CM | POA: Insufficient documentation

## 2019-01-18 DIAGNOSIS — R030 Elevated blood-pressure reading, without diagnosis of hypertension: Secondary | ICD-10-CM | POA: Diagnosis not present

## 2019-01-18 MED ORDER — ACETAMINOPHEN-CODEINE #3 300-30 MG PO TABS: 1.0000 | ORAL_TABLET | ORAL | 0 refills | Status: DC | PRN

## 2019-01-18 MED ORDER — METHOCARBAMOL 500 MG PO TABS: 1000.0000 mg | ORAL_TABLET | Freq: Three times a day (TID) | ORAL | 1 refills | Status: DC | PRN

## 2019-01-18 NOTE — Patient Instructions (Signed)
Check blood pressure out of the office 3 times/week and record and bring to next visit

## 2019-01-20 ENCOUNTER — Encounter (HOSPITAL_COMMUNITY): Payer: Self-pay

## 2019-01-20 ENCOUNTER — Emergency Department (HOSPITAL_COMMUNITY)
Admission: EM | Admit: 2019-01-20 | Discharge: 2019-01-20 | Disposition: A | Discharge status: Home / Self Care | Payer: Medicaid Other | Attending: Emergency Medicine | Admitting: Emergency Medicine

## 2019-01-20 ENCOUNTER — Other Ambulatory Visit: Payer: Self-pay

## 2019-01-20 DIAGNOSIS — M542 Cervicalgia: Secondary | ICD-10-CM | POA: Insufficient documentation

## 2019-01-20 DIAGNOSIS — F17210 Nicotine dependence, cigarettes, uncomplicated: Secondary | ICD-10-CM | POA: Insufficient documentation

## 2019-01-20 DIAGNOSIS — G8929 Other chronic pain: Secondary | ICD-10-CM | POA: Diagnosis not present

## 2019-01-20 DIAGNOSIS — M549 Dorsalgia, unspecified: Secondary | ICD-10-CM | POA: Diagnosis present

## 2019-01-20 DIAGNOSIS — Z79899 Other long term (current) drug therapy: Secondary | ICD-10-CM | POA: Insufficient documentation

## 2019-01-20 MED ORDER — OXYCODONE HCL 5 MG PO TABS
5.0000 mg | ORAL_TABLET | Freq: Once | ORAL | Status: AC
  Administered 2019-01-20: 5 mg via ORAL
  Filled 2019-01-20: qty 1

## 2019-01-20 MED ORDER — ACETAMINOPHEN 325 MG PO TABS
650.0000 mg | ORAL_TABLET | Freq: Once | ORAL | Status: AC
  Administered 2019-01-20: 650 mg via ORAL
  Filled 2019-01-20: qty 2

## 2019-01-20 MED ORDER — LIDOCAINE 5 % EX PTCH: 1.0000 | MEDICATED_PATCH | CUTANEOUS | 0 refills | Status: AC

## 2019-01-20 MED ORDER — IBUPROFEN 800 MG PO TABS
800.0000 mg | ORAL_TABLET | Freq: Once | ORAL | Status: AC
  Administered 2019-01-20: 800 mg via ORAL
  Filled 2019-01-20: qty 1

## 2019-01-20 MED ORDER — LIDOCAINE 5 % EX PTCH
1.0000 | MEDICATED_PATCH | CUTANEOUS | Status: DC
  Administered 2019-01-20: 1 via TRANSDERMAL
  Filled 2019-01-20: qty 1

## 2019-01-20 MED ORDER — IBUPROFEN 600 MG PO TABS: 600.0000 mg | ORAL_TABLET | Freq: Three times a day (TID) | ORAL | 0 refills | Status: AC | PRN

## 2019-01-20 NOTE — ED Provider Notes (Signed)
[facility]-EMERGENCY DEPT Provider Note   CSN: [id number] Arrival date & time: 01/20/19  1221    History   Chief Complaint Chief Complaint  Patient presents with   Neck Pain   Back Pain    HPI [name] is a 63 y.o. female.     The history is provided by the patient.  Neck Pain  Pain location:  Generalized neck (diffuse back and neck pain, chronic) Quality:  Aching Pain radiates to:  Does not radiate Pain severity:  Mild Pain is:  Unable to specify Onset quality:  Gradual Timing:  Intermittent Progression:  Waxing and waning Chronicity:  Chronic Context comment:  Awaiting to see new pain specialist next week. Use to be at other pain office but has moved. Used to be on opiods. No new trauma or symptoms, just worsening pain today than usual. Relieved by:  NSAIDs Worsened by:  Twisting Ineffective treatments:  None tried Associated symptoms: no bladder incontinence, no bowel incontinence, no chest pain, no fever, no headaches, no leg pain, no numbness, no paresis, no photophobia, no syncope, no tingling, no visual change, no weakness and no weight loss   Back Pain  Associated symptoms: no abdominal pain, no bladder incontinence, no bowel incontinence, no chest pain, no dysuria, no fever, no headaches, no leg pain, no numbness, no tingling, no weakness and no weight loss     Past Medical History:  Diagnosis Date   Alcohol dependence (HCC)    Anxiety    Chronic pain    Depression    Herniated cervical disc     Patient Active Problem List   Diagnosis Date Noted   Suicidal ideation    Delirium tremens (HCC) 12/13/2018   Thrombocytopenia (HCC) 12/13/2018   Homeless 12/13/2018   Depression 12/13/2018   Neuropathy 12/13/2018   Subarachnoid hemorrhage (HCC) 12/11/2018   Seizure due to alcohol withdrawal (HCC) 12/11/2018   Alcohol dependence with alcohol-induced mood disorder (HCC)    MDD (major depressive disorder), recurrent  severe, without psychosis (HCC) 10/12/2018   Major depressive disorder, 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    Past Surgical History:  Procedure Laterality Date   ABDOMINAL HYSTERECTOMY     ANKLE SURGERY Right    CARPAL TUNNEL RELEASE Bilateral    CERVICAL FUSION     CHOLECYSTECTOMY     KNEE SURGERY       OB History   No obstetric history on file.      Home Medications    Prior to Admission medications   Medication Sig Start Date End Date Taking? Authorizing Provider  acetaminophen-codeine (TYLENOL #3) 300-30 MG tablet Take 1 tablet by mouth every 4 (four) hours as needed for moderate pain. 01/18/19   Anders Simmonds, PA-C  celecoxib (CELEBREX) 200 MG capsule Take 200 mg by mouth 2 (two) times daily. 01/12/19   [provider]  cyclobenzaprine (FLEXERIL) 5 MG tablet Take 1 tablet (5 mg total) by mouth at bedtime. 01/05/19   Elvina Sidle, MD  FLUoxetine (PROZAC) 20 MG capsule Take 20 mg by mouth daily. 01/16/19   [provider]  FLUoxetine (PROZAC) 20 MG tablet Take 1 tablet (20 mg total) by mouth daily. 01/05/19   Elvina Sidle, MD  gabapentin (NEURONTIN) 400 MG capsule Take 1 capsule (400 mg total) by mouth 3 (three) times daily. 01/05/19   Elvina Sidle, MD  gabapentin (NEURONTIN) 800 MG tablet Take 800 mg by mouth 3 (three) times daily. 01/16/19   [provider]  HYDROcodone-acetaminophen (NORCO/VICODIN) 5-325 MG tablet Take 1 tablet by mouth every 6 (six) hours as needed for moderate pain. 01/10/19   Gwyneth Sprout, MD  ibuprofen (ADVIL,MOTRIN) 600 MG tablet Take 1 tablet (600 mg total) by mouth every 8 (eight) hours as needed for up to 20 days. 01/20/19 02/09/19  Izzac Rockett, DO  levETIRAcetam (KEPPRA) 500 MG tablet Take 1 tablet (500 mg total) by mouth 2 (two) times daily for 30 days. 12/27/18 01/26/19  Money, [name], FNP  lidocaine (LIDODERM) 5 % Place 1 patch onto the skin daily for 30 days. Remove & Discard  patch within 12 hours or as directed by MD 01/20/19 02/19/19  Virgina Norfolk, DO  methocarbamol (ROBAXIN) 500 MG tablet Take 2 tablets (1,000 mg total) by mouth every 8 (eight) hours as needed for muscle spasms. 01/18/19   Anders Simmonds, PA-C  pantoprazole (PROTONIX) 40 MG tablet Take 1 tablet (40 mg total) by mouth daily. 12/28/18   Money, [name], FNP    Family History History reviewed. No pertinent family history.  Social History Social History   Tobacco Use   Smoking status: Current Every Day Smoker    Packs/day: 0.50    Years: 7.00    Pack years: 3.50    Types: Cigarettes   Smokeless tobacco: Never Used  Substance Use Topics   Alcohol use: Not Currently    Alcohol/week: 3.0 standard drinks    Types: 3 Standard drinks or equivalent per week    Comment: patient last drank 35 days ago   Drug use: Not Currently    Types: Marijuana     Allergies   Cephalexin; Ketorolac; and Tramadol   Review of Systems Review of Systems  Constitutional: Negative for chills, fever and weight loss.  HENT: Negative for ear pain and sore throat.   Eyes: Negative for photophobia, pain and visual disturbance.  Respiratory: Negative for cough and shortness of breath.   Cardiovascular: Negative for chest pain, palpitations and syncope.  Gastrointestinal: Negative for abdominal pain, bowel incontinence and vomiting.  Genitourinary: Negative for bladder incontinence, dysuria and hematuria.  Musculoskeletal: Positive for back pain and neck pain. Negative for arthralgias.  Skin: Negative for color change and rash.  Neurological: Negative for tingling, seizures, syncope, weakness, numbness and headaches.  All other systems reviewed and are negative.    Physical Exam Updated Vital Signs BP (!) 143/95    Pulse 89    Temp 98.3 F (36.8 C) (Oral)    Resp 16    Ht 5\' 1"  (1.549 m)    Wt 60.9 kg    SpO2 99%    BMI 25.37 kg/m   Physical Exam Vitals signs and nursing note reviewed.    Constitutional:      General: She is not in acute distress.    Appearance: She is well-developed.  HENT:     Head: Normocephalic and atraumatic.     Mouth/Throat:     Mouth: Mucous membranes are moist.  Eyes:     Conjunctiva/sclera: Conjunctivae normal.     Pupils: Pupils are equal, round, and reactive to light.  Neck:     Musculoskeletal: Normal range of motion and neck supple. Muscular tenderness present.     Comments: No midline spinal pain, TTP to paraspinal muscles bilaterally Cardiovascular:     Rate and Rhythm: Normal rate and regular rhythm.     Pulses: Normal pulses.     Heart sounds: Normal heart sounds. No murmur.  Pulmonary:  Effort: Pulmonary effort is normal. No respiratory distress.     Breath sounds: Normal breath sounds.  Abdominal:     Palpations: Abdomen is soft.     Tenderness: There is no abdominal tenderness.  Musculoskeletal: Normal range of motion.        General: Tenderness present. No swelling or deformity.     Comments: No midline spinal tenderness, paraspinal muscle tenderness in upper thoracic spine  Skin:    General: Skin is warm and dry.  Neurological:     Mental Status: She is alert and oriented to person, place, and time.     Cranial Nerves: No cranial nerve deficit.     Motor: No weakness.     Comments: 5+/5 strength, normal sensation, no drift, normal finger to nose finger  Psychiatric:        Mood and Affect: Mood normal.      ED Treatments / Results  Labs (all labs ordered are listed, but only abnormal results are displayed) Labs Reviewed - No data to display  EKG None  Radiology No results found.  Procedures Procedures (including critical care time)  Medications Ordered in ED Medications  oxyCODONE (Oxy IR/ROXICODONE) immediate release tablet 5 mg (has no administration in time range)  ibuprofen (ADVIL,MOTRIN) tablet 800 mg (has no administration in time range)  acetaminophen (TYLENOL) tablet 650 mg (has no  administration in time range)  lidocaine (LIDODERM) 5 % 1 patch (has no administration in time range)     Initial Impression / Assessment and Plan / ED Course  I have reviewed the triage vital signs and the nursing notes.  Pertinent labs & imaging results that were available during my care of the patient were reviewed by me and considered in my medical decision making (see chart for details).     [name] is a 63 year old female with history of chronic pain who presents to the ED with chronic back and neck pain.  Denies any new trauma.  Normal vitals.  Normal neurological exam.  Denies any numbness, tingling, weakness.  No loss of bowel or bladder.  No saddle anesthesia.  Pain is mostly in the upper back and neck as well.  No signs to suggest meningitis.  She used to be on chronic opioids and then moved and is awaiting new pain medicine doctor which she is supposed to see on Monday.  Patient was given dose of Roxicodone here in the ED.  Given Motrin, Tylenol lidocaine patch.  Given prescription for lidocaine patch and Motrin.  Patient has no midline spinal pain.  Overall likely visit today is for chronic issues.  Given return precautions and discharged from ED in good condition.  This chart was dictated using voice recognition software.  Despite best efforts to proofread,  errors can occur which can change the documentation meaning.    Final Clinical Impressions(s) / ED Diagnoses   Final diagnoses:  Chronic back pain, unspecified back location, unspecified back pain laterality    ED Discharge Orders         Ordered    lidocaine (LIDODERM) 5 %  Every 24 hours     01/20/19 1252    ibuprofen (ADVIL,MOTRIN) 600 MG tablet  Every 8 hours PRN     01/20/19 1252           Virgina Norfolk, DO 01/20/19 1254

## 2019-01-20 NOTE — ED Triage Notes (Addendum)
Patient c/o right neck pain that radiates into the right neck and right lower back pain that radiates into the right hip x 2 years, but worse in the past 3 weeks. Patient states she has an appointment with pain management in 3 days at 1500.

## 2019-01-20 NOTE — Discharge Instructions (Addendum)
Continue to use Motrin, Tylenol, lidocaine patches for her chronic back and neck pain.  Make sure you make your appointment with your chronic pain physician on Monday.

## 2019-01-31 ENCOUNTER — Encounter (HOSPITAL_COMMUNITY): Payer: Self-pay

## 2019-01-31 ENCOUNTER — Other Ambulatory Visit: Payer: Self-pay

## 2019-01-31 ENCOUNTER — Emergency Department (HOSPITAL_COMMUNITY)
Admission: EM | Admit: 2019-01-31 | Discharge: 2019-01-31 | Discharge status: Against Medical Advice | Payer: Medicaid Other | Attending: Emergency Medicine | Admitting: Emergency Medicine

## 2019-01-31 DIAGNOSIS — F17210 Nicotine dependence, cigarettes, uncomplicated: Secondary | ICD-10-CM | POA: Diagnosis not present

## 2019-01-31 DIAGNOSIS — Z79899 Other long term (current) drug therapy: Secondary | ICD-10-CM | POA: Insufficient documentation

## 2019-01-31 DIAGNOSIS — F1010 Alcohol abuse, uncomplicated: Secondary | ICD-10-CM | POA: Diagnosis not present

## 2019-01-31 DIAGNOSIS — M542 Cervicalgia: Secondary | ICD-10-CM | POA: Diagnosis present

## 2019-01-31 HISTORY — DX: Unspecified convulsions: R56.9

## 2019-01-31 HISTORY — DX: Diverticulitis of intestine, part unspecified, without perforation or abscess without bleeding: K57.92

## 2019-01-31 LAB — COMPREHENSIVE METABOLIC PANEL
ALT: 21 U/L (ref 0–44)
AST: 36 U/L (ref 15–41)
Albumin: 4.1 g/dL (ref 3.5–5.0)
Alkaline Phosphatase: 50 U/L (ref 38–126)
Anion gap: 12 (ref 5–15)
BUN: 9 mg/dL (ref 8–23)
CO2: 23 mmol/L (ref 22–32)
Calcium: 8.8 mg/dL — ABNORMAL LOW (ref 8.9–10.3)
Chloride: 110 mmol/L (ref 98–111)
Creatinine, Ser: 0.53 mg/dL (ref 0.44–1.00)
GFR calc Af Amer: >60 mL/min (ref >60)
GFR calc non Af Amer: >60 mL/min (ref >60)
Glucose, Bld: 94 mg/dL (ref 70–99)
Potassium: 3.2 mmol/L — ABNORMAL LOW (ref 3.5–5.1)
Sodium: 145 mmol/L (ref 135–145)
Total Bilirubin: 0.5 mg/dL (ref 0.3–1.2)
Total Protein: 6.8 g/dL (ref 6.5–8.1)

## 2019-01-31 LAB — CBC
HCT: 42.1 % (ref 36.0–46.0)
Hemoglobin: 14 g/dL (ref 12.0–15.0)
MCH: 32.6 pg (ref 26.0–34.0)
MCHC: 33.3 g/dL (ref 30.0–36.0)
MCV: 97.9 fL (ref 80.0–100.0)
Platelets: 347 10*3/uL (ref 150–400)
RBC: 4.3 MIL/uL (ref 3.87–5.11)
RDW: 13 % (ref 11.5–15.5)
WBC: 5.5 10*3/uL (ref 4.0–10.5)
nRBC: 0 % (ref 0.0–0.2)

## 2019-01-31 LAB — ETHANOL: Alcohol, Ethyl (B): 397 mg/dL (ref <10)

## 2019-01-31 MED ORDER — POTASSIUM CHLORIDE CRYS ER 20 MEQ PO TBCR: 40.0000 meq | EXTENDED_RELEASE_TABLET | Freq: Once | ORAL | Status: DC

## 2019-01-31 MED ORDER — SODIUM CHLORIDE 0.9 % IV BOLUS: 1000.0000 mL | Freq: Once | INTRAVENOUS | Status: DC

## 2019-01-31 NOTE — ED Notes (Signed)
Bed: WA09 Expected date:  Expected time:  Means of arrival:  Comments: EMS ETOH

## 2019-01-31 NOTE — ED Notes (Addendum)
Date and time results received: 01/31/19 1213 (use smartphrase ".now" to insert current time)  Test: ETOH Critical Value: 397  Name of Provider Notified: Nanavati  Orders Received? Or Actions Taken? no

## 2019-01-31 NOTE — ED Notes (Signed)
RN attempted IV start x2. Pt moving around and RN unable to get bloodwork or IV.  Nurse tech attempting blood draw at this time.

## 2019-01-31 NOTE — ED Triage Notes (Signed)
Pt reports drinking a "fifth of vodka" daily, was found on the ground outside by [employer] , pt is lethargic and slurring words. Pt states "please help me or I'll die"

## 2019-01-31 NOTE — ED Notes (Signed)
Upon entering pt room, found pt to be gone. All belongings were taken, and pt mask was lying on the bed. Pt was seen by staff leaving the department and sitting outside. Pt did not have IV access.

## 2019-01-31 NOTE — ED Provider Notes (Signed)
[facility]-EMERGENCY DEPT Provider Note   CSN: [id number] Arrival date & time: 01/31/19  1153    History   Chief Complaint Chief Complaint  Patient presents with  . Alcohol Problem    wants detox from alcohol    HPI [name] is a 63 y.o. female.     HPI   [name] is a 63 y.o. female, with a history of EtOH abuse, anxiety, chronic pain, presenting to the ED with chronic neck pain.  She states, "I am in so much pain I cannot move," though she would turn her head to follow me throughout the interview.  She confirms she has had this pain before. Patient states she is cared for by pain management, [facility] [place]. States, "I usually have percocet, but there was a mix up with Medicaid and they would not refill my medicine." She states she typically takes one 5-325 mg oxycodone-APAP every 6 hours.  Her last dose was 4 days ago.  She also states she would like detox from alcohol.  Drinks a fifth of vodka daily with last drink this morning shortly prior to coming to the ED. States she would like to go to rehab.  Denies illicit drug use.  Denies recent illness.  Denies SI/HI. Denies falls/trauma, weakness, numbness, chest pain, shortness of breath, abdominal pain, cough, fever, N/V/C/D, diaphoresis, seizures, tremors, or any other complaints.    Past Medical History:  Diagnosis Date  . Alcohol dependence (HCC)   . Anxiety   . Chronic pain   . Depression   . Diverticulitis   . Herniated cervical disc   . Seizures (HCC)    alcoholic seizures    Patient Active Problem List   Diagnosis Date Noted  . Suicidal ideation   . Delirium tremens (HCC) 12/13/2018  . Thrombocytopenia (HCC) 12/13/2018  . Homeless 12/13/2018  . Depression 12/13/2018  . Neuropathy 12/13/2018  . Subarachnoid hemorrhage (HCC) 12/11/2018  . Seizure due to alcohol withdrawal (HCC) 12/11/2018  . Alcohol dependence with alcohol-induced mood disorder (HCC)    . 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 Major depressive disorder, 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    Past Surgical History:  Procedure Laterality Date  . ABDOMINAL HYSTERECTOMY    . ANKLE SURGERY Right   . CARPAL TUNNEL RELEASE Bilateral   . CERVICAL FUSION    . CHOLECYSTECTOMY    . KNEE SURGERY       OB History   No obstetric history on file.      Home Medications    Prior to Admission medications   Medication Sig Start Date End Date Taking? Authorizing Provider  acetaminophen-codeine (TYLENOL #3) 300-30 MG tablet Take 1 tablet by mouth every 4 (four) hours as needed for moderate pain. 01/18/19   Anders SimmondsMcClung, Angela M, PA-C  celecoxib (CELEBREX) 200 MG capsule Take 200 mg by mouth 2 (two) times daily. 01/12/19   [provider]  cyclobenzaprine (FLEXERIL) 5 MG tablet Take 1 tablet (5 mg total) by mouth at bedtime. 01/05/19   Elvina SidleLauenstein, Kurt, MD  FLUoxetine (PROZAC) 20 MG tablet Take 1 tablet (20 mg total) by mouth daily. 01/05/19   Elvina SidleLauenstein, Kurt, MD  gabapentin (NEURONTIN) 400 MG capsule Take 1 capsule (400 mg total) by mouth 3 (three) times daily. 01/05/19   Elvina SidleLauenstein, Kurt, MD  gabapentin (NEURONTIN) 800 MG tablet Take 800 mg by mouth 3 (three) times daily. 01/16/19   [provider]  HYDROcodone-acetaminophen (NORCO/VICODIN) 5-325 MG tablet Take 1 tablet by mouth every 6 (six) hours as needed for moderate pain. 01/10/19   Gwyneth Sprout, MD  ibuprofen (ADVIL,MOTRIN) 600 MG tablet Take 1 tablet (600 mg total) by mouth every 8 (eight) hours as needed for up to 20 days. Patient taking differently: Take 600 mg by mouth every 8 (eight) hours as needed for fever, headache or moderate pain.  01/20/19 02/09/19  Curatolo, Adam, DO  levETIRAcetam (KEPPRA) 500 MG tablet Take 1 tablet (500 mg total) by mouth 2 (two) times daily for 30 days. 12/27/18 01/26/19  Money, [name], FNP  lidocaine  (LIDODERM) 5 % Place 1 patch onto the skin daily for 30 days. Remove & Discard patch within 12 hours or as directed by MD 01/20/19 02/19/19  Virgina Norfolk, DO  methocarbamol (ROBAXIN) 500 MG tablet Take 2 tablets (1,000 mg total) by mouth every 8 (eight) hours as needed for muscle spasms. 01/18/19   Anders Simmonds, PA-C  pantoprazole (PROTONIX) 40 MG tablet Take 1 tablet (40 mg total) by mouth daily. 12/28/18   Money, [name], FNP    Family History No family history on file.  Social History Social History   Tobacco Use  . Smoking status: Current Every Day Smoker    Packs/day: 0.50    Years: 7.00    Pack years: 3.50    Types: Cigarettes  . Smokeless tobacco: Never Used  Substance Use Topics  . Alcohol use: Yes    Comment: drinks a fifth of vodka daily  . Drug use: Not Currently    Types: Marijuana     Allergies   Cephalexin; Ketorolac; and Tramadol   Review of Systems Review of Systems  Constitutional: Negative for chills, diaphoresis and fever.  Respiratory: Negative for cough and shortness of breath.   Cardiovascular: Negative for chest pain and leg swelling.  Gastrointestinal: Negative for abdominal pain, diarrhea, nausea and vomiting.  Musculoskeletal: Positive for neck pain.  Neurological: Negative for dizziness, tremors, syncope, weakness, light-headedness, numbness and headaches.  Psychiatric/Behavioral: Negative for confusion.  All other systems reviewed and are negative.    Physical Exam Updated Vital Signs BP 127/87 (BP Location: Right Arm)   Pulse 70   Temp 98.4 F (36.9 C) (Oral)   Resp 13   Ht 5\' 1"  (1.549 m)   Wt 60.9 kg   SpO2 98%   BMI 25.37 kg/m   Physical Exam Vitals signs and nursing note reviewed.  Constitutional:      General: She is not in acute distress.    Appearance: She is well-developed. She is not diaphoretic.  HENT:     Head: Normocephalic and atraumatic.     Mouth/Throat:     Mouth: Mucous membranes are moist.     Pharynx:  Oropharynx is clear.  Eyes:     Extraocular Movements: Extraocular movements intact.     Conjunctiva/sclera: Conjunctivae normal.     Pupils: Pupils are equal, round, and reactive to light.  Neck:     Musculoskeletal: Neck supple.  Cardiovascular:     Rate and Rhythm: Normal rate and regular rhythm.     Pulses: Normal pulses.          Radial pulses are 2+ on the right side and 2+ on the left side.       Posterior tibial pulses are 2+ on the right side and 2+ on the left side.     Heart sounds: Normal heart sounds.  Comments: Tactile temperature in the extremities appropriate and equal bilaterally. Pulmonary:     Effort: Pulmonary effort is normal. No respiratory distress.     Breath sounds: Normal breath sounds.  Abdominal:     Palpations: Abdomen is soft.     Tenderness: There is no abdominal tenderness. There is no guarding.  Musculoskeletal:     Right lower leg: No edema.     Left lower leg: No edema.     Comments: Patient indicates she has tenderness throughout the neck and back.  No swelling, color change, deformity, or instability noted. Normal motor function intact in all extremities. No midline spinal tenderness.  Despite the patient's complaint of pain, she is noted to rotate her head to follow me around the room, as well as tilt her head to look at her feet and up at the ceiling, all without noted hesitation or difficulty. She is able to sit up from a supine position without assistance and at various points during the interview was noted to be able to twist in her back to roll from side to side.  Lymphadenopathy:     Cervical: No cervical adenopathy.  Skin:    General: Skin is warm and dry.  Neurological:     Mental Status: She is alert and oriented to person, place, and time.     Comments: Sensation grossly intact to light touch in the extremities.  Grip strengths equal bilaterally.  Strength 5/5 in all extremities. No gait disturbance. Coordination intact. Cranial  nerves III-XII grossly intact. No facial droop.  No tremor.  Psychiatric:        Mood and Affect: Mood and affect normal.        Speech: Speech normal.        Behavior: Behavior normal.      ED Treatments / Results  Labs (all labs ordered are listed, but only abnormal results are displayed) Labs Reviewed  COMPREHENSIVE METABOLIC PANEL - Abnormal; Notable for the following components:      Result Value   Potassium 3.2 (*)    Calcium 8.8 (*)    All other components within normal limits  ETHANOL - Abnormal; Notable for the following components:   Alcohol, Ethyl (B) 397 (*)    All other components within normal limits  CBC  RAPID URINE DRUG SCREEN, HOSP PERFORMED    EKG None  Radiology No results found.  Procedures Procedures (including critical care time)  Medications Ordered in ED Medications  potassium chloride SA (K-DUR,KLOR-CON) CR tablet 40 mEq (has no administration in time range)  sodium chloride 0.9 % bolus 1,000 mL (has no administration in time range)     Initial Impression / Assessment and Plan / ED Course  I have reviewed the triage vital signs and the nursing notes.  Pertinent labs & imaging results that were available during my care of the patient were reviewed by me and considered in my medical decision making (see chart for details).  Clinical Course as of Jan 31 1531  Tue Jan 31, 2019  1220 When SPO2 when drop to this level, it was noted it was associated with poor or unorganized waveform. SPO2 96-98% with good waveform.  SpO2: 92 % [SJ]  1438 Patient no longer in the room. Her purse is gone, monitoring wires are on the bed, and identification wristband is on the floor next to the bed.   [SJ]    Clinical Course User Index [SJ] Shanell Aden C, PA-C  Patient presents with a complaint of chronic neck pain.  She states she is out of her home pain medication. Patient is nontoxic appearing, afebrile, not tachycardic, not tachypneic, not  hypotensive, maintains adequate SPO2 on room air, and is in no apparent distress.  When I spoke with the patient, she actually denied alcohol abuse multiple times.  Only when the RN asked about the information patient had given triage, did the patient admit to alcohol abuse and stated, "Oh, and I would like detox." Patient does not present with signs of acute alcohol or opioid withdrawal. Patient was given outpatient detox resources by behavioral health counselor  Ethanol noted to be 397, however, patient was noted to be spontaneously alert, oriented, have adequate coordination, and was noted to ambulate without assistance or signs of difficulty. She did have some slurred speech, but this improved over ED course and was thought to be due to alcohol consumption.   Patient asked for narcotic prescription on multiple occasions while here in the ED, but I declined to honor this request, explaining my position each time.  Patient eloped. She clearly asked the triage RN if the door in front of her was the exit. Triage RN noted patient walked quickly and with a steady gait out of the department.  Patient eloped prior to potassium administration.    Final Clinical Impressions(s) / ED Diagnoses   Final diagnoses:  Alcohol abuse    ED Discharge Orders    None       Concepcion Living 01/31/19 1533    Derwood Kaplan, MD 01/31/19 1649

## 2019-01-31 NOTE — ED Notes (Addendum)
Pt aware of urine sample 

## 2019-02-08 ENCOUNTER — Encounter (HOSPITAL_COMMUNITY): Payer: Self-pay

## 2019-02-08 ENCOUNTER — Emergency Department (HOSPITAL_COMMUNITY)
Admission: EM | Admit: 2019-02-08 | Discharge: 2019-02-09 | Disposition: A | Discharge status: Home / Self Care | Payer: Medicaid Other | Attending: Emergency Medicine | Admitting: Emergency Medicine

## 2019-02-08 ENCOUNTER — Other Ambulatory Visit: Payer: Self-pay

## 2019-02-08 DIAGNOSIS — S0101XA Laceration without foreign body of scalp, initial encounter: Secondary | ICD-10-CM | POA: Insufficient documentation

## 2019-02-08 DIAGNOSIS — Y999 Unspecified external cause status: Secondary | ICD-10-CM | POA: Insufficient documentation

## 2019-02-08 DIAGNOSIS — Y939 Activity, unspecified: Secondary | ICD-10-CM | POA: Insufficient documentation

## 2019-02-08 DIAGNOSIS — D62 Acute posthemorrhagic anemia: Secondary | ICD-10-CM

## 2019-02-08 DIAGNOSIS — S0003XA Contusion of scalp, initial encounter: Secondary | ICD-10-CM | POA: Insufficient documentation

## 2019-02-08 DIAGNOSIS — F17210 Nicotine dependence, cigarettes, uncomplicated: Secondary | ICD-10-CM | POA: Insufficient documentation

## 2019-02-08 DIAGNOSIS — Z79899 Other long term (current) drug therapy: Secondary | ICD-10-CM | POA: Insufficient documentation

## 2019-02-08 DIAGNOSIS — Y929 Unspecified place or not applicable: Secondary | ICD-10-CM | POA: Insufficient documentation

## 2019-02-08 DIAGNOSIS — F10220 Alcohol dependence with intoxication, uncomplicated: Secondary | ICD-10-CM

## 2019-02-08 DIAGNOSIS — S0990XA Unspecified injury of head, initial encounter: Secondary | ICD-10-CM | POA: Diagnosis present

## 2019-02-08 DIAGNOSIS — S40021A Contusion of right upper arm, initial encounter: Secondary | ICD-10-CM
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
## 2019-02-09 ENCOUNTER — Emergency Department (HOSPITAL_COMMUNITY): Payer: Medicaid Other

## 2019-02-09 LAB — CBC WITH DIFFERENTIAL/PLATELET
Abs Immature Granulocytes: 0.04 10*3/uL (ref 0.00–0.07)
Basophils Absolute: 0 10*3/uL (ref 0.0–0.1)
Basophils Relative: 0 %
Eosinophils Absolute: 0 10*3/uL (ref 0.0–0.5)
Eosinophils Relative: 0 %
HCT: 31.4 % — ABNORMAL LOW (ref 36.0–46.0)
Hemoglobin: 10.6 g/dL — ABNORMAL LOW (ref 12.0–15.0)
Immature Granulocytes: 1 %
Lymphocytes Relative: 25 %
Lymphs Abs: 1.8 10*3/uL (ref 0.7–4.0)
MCH: 32.8 pg (ref 26.0–34.0)
MCHC: 33.8 g/dL (ref 30.0–36.0)
MCV: 97.2 fL (ref 80.0–100.0)
Monocytes Absolute: 0.3 10*3/uL (ref 0.1–1.0)
Monocytes Relative: 4 %
Neutro Abs: 4.8 10*3/uL (ref 1.7–7.7)
Neutrophils Relative %: 70 %
Platelets: 154 10*3/uL (ref 150–400)
RBC: 3.23 MIL/uL — ABNORMAL LOW (ref 3.87–5.11)
RDW: 12.9 % (ref 11.5–15.5)
WBC: 6.9 10*3/uL (ref 4.0–10.5)
nRBC: 0 % (ref 0.0–0.2)

## 2019-02-09 LAB — BASIC METABOLIC PANEL WITH GFR
Anion gap: 14 (ref 5–15)
BUN: 7 mg/dL — ABNORMAL LOW (ref 8–23)
CO2: 22 mmol/L (ref 22–32)
Calcium: 8.1 mg/dL — ABNORMAL LOW (ref 8.9–10.3)
Chloride: 102 mmol/L (ref 98–111)
Creatinine, Ser: 0.56 mg/dL (ref 0.44–1.00)
GFR calc Af Amer: >60 mL/min
GFR calc non Af Amer: >60 mL/min
Glucose, Bld: 98 mg/dL (ref 70–99)
Potassium: 3.3 mmol/L — ABNORMAL LOW (ref 3.5–5.1)
Sodium: 138 mmol/L (ref 135–145)

## 2019-02-09 LAB — ETHANOL: Alcohol, Ethyl (B): 290 mg/dL — ABNORMAL HIGH (ref <10)

## 2019-02-09 MED ORDER — HYDROGEN PEROXIDE 3 % EX SOLN
CUTANEOUS | Status: AC
  Administered 2019-02-09: 1
  Filled 2019-02-09: qty 946

## 2019-02-09 MED ORDER — LIDOCAINE-EPINEPHRINE (PF) 2 %-1:200000 IJ SOLN
20.0000 mL | Freq: Once | INTRAMUSCULAR | Status: AC
  Administered 2019-02-09: 20 mL
  Filled 2019-02-09: qty 20

## 2019-02-09 MED ORDER — ONDANSETRON 8 MG PO TBDP
8.0000 mg | ORAL_TABLET | Freq: Once | ORAL | Status: AC
  Administered 2019-02-09: 8 mg via ORAL
  Filled 2019-02-09: qty 1

## 2019-02-09 MED ORDER — SODIUM CHLORIDE 0.9 % IV BOLUS
1000.0000 mL | Freq: Once | INTRAVENOUS | Status: AC
  Administered 2019-02-09: 1000 mL via INTRAVENOUS
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
## 2019-02-09 NOTE — ED Notes (Signed)
Obtained vitals for discharge and had a manual BP of 74/54. Patient complaining of dizziness and lightheadedness. MD made aware. Will continue to monitor patient.

## 2019-02-09 NOTE — ED Notes (Signed)
Patient states "I am not feeling well, I threw up." Appears that patient spit water out on the floor. MD notified. Will continue to monitor patient.

## 2019-02-09 NOTE — ED Notes (Signed)
Patient able to ambulate with minimal assistance and A&O x4 upon discharge.

## 2019-02-16 ENCOUNTER — Encounter (HOSPITAL_COMMUNITY): Payer: Self-pay | Admitting: Emergency Medicine

## 2019-02-16 ENCOUNTER — Other Ambulatory Visit: Payer: Self-pay

## 2019-02-16 ENCOUNTER — Emergency Department (HOSPITAL_COMMUNITY)
Admission: EM | Admit: 2019-02-16 | Discharge: 2019-02-16 | Disposition: A | Discharge status: Home / Self Care | Payer: Medicaid Other | Attending: Emergency Medicine | Admitting: Emergency Medicine

## 2019-02-16 DIAGNOSIS — Z4802 Encounter for removal of sutures: Secondary | ICD-10-CM | POA: Diagnosis not present

## 2019-02-16 DIAGNOSIS — R51 Headache: Secondary | ICD-10-CM | POA: Diagnosis not present

## 2019-02-16 DIAGNOSIS — F17210 Nicotine dependence, cigarettes, uncomplicated: Secondary | ICD-10-CM | POA: Diagnosis not present

## 2019-02-16 DIAGNOSIS — Z79899 Other long term (current) drug therapy: Secondary | ICD-10-CM | POA: Diagnosis not present

## 2019-02-16 DIAGNOSIS — Z5189 Encounter for other specified aftercare: Secondary | ICD-10-CM

## 2019-02-16 DIAGNOSIS — S0101XD Laceration without foreign body of scalp, subsequent encounter: Secondary | ICD-10-CM | POA: Diagnosis not present

## 2019-02-16 MED ORDER — ONDANSETRON 4 MG PO TBDP
4.0000 mg | ORAL_TABLET | Freq: Once | ORAL | Status: AC
  Administered 2019-02-16: 4 mg via ORAL
  Filled 2019-02-16: qty 1

## 2019-02-16 MED ORDER — ACETAMINOPHEN 500 MG PO TABS: 500.0000 mg | ORAL_TABLET | Freq: Four times a day (QID) | ORAL | 0 refills | Status: DC | PRN

## 2019-02-16 MED ORDER — DOXYCYCLINE HYCLATE 100 MG PO CAPS: 100.0000 mg | ORAL_CAPSULE | Freq: Two times a day (BID) | ORAL | 0 refills | Status: AC

## 2019-02-16 MED ORDER — DOXYCYCLINE HYCLATE 100 MG PO TABS
100.0000 mg | ORAL_TABLET | Freq: Once | ORAL | Status: AC
  Administered 2019-02-16: 100 mg via ORAL
  Filled 2019-02-16: qty 1

## 2019-02-16 MED ORDER — ACETAMINOPHEN 325 MG PO TABS
650.0000 mg | ORAL_TABLET | Freq: Once | ORAL | Status: AC
  Administered 2019-02-16: 650 mg via ORAL
  Filled 2019-02-16: qty 2

## 2019-02-16 NOTE — ED Notes (Signed)
ED Provider at bedside. 

## 2019-02-16 NOTE — Discharge Instructions (Addendum)
Keep your wound clean. Take the antibiotic as prescribed until gone. Report to the urgent care in 2-3 days for wound recheck. If you develop worsening pain, redness, or swelling surrounding your wound, or persistent milky yellow drainage from your wound, report back to the emergency department for further evaluation.
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
## 2019-02-16 NOTE — ED Triage Notes (Signed)
Pt here to get staples out of posterior head that she had placed on 4/22.

## 2019-03-15 ENCOUNTER — Ambulatory Visit: Payer: Medicaid Other

## 2019-03-29 ENCOUNTER — Encounter (HOSPITAL_COMMUNITY): Payer: Self-pay

## 2019-03-29 ENCOUNTER — Other Ambulatory Visit: Payer: Self-pay

## 2019-03-29 ENCOUNTER — Emergency Department (HOSPITAL_COMMUNITY)
Admission: EM | Admit: 2019-03-29 | Discharge: 2019-03-29 | Disposition: A | Discharge status: Home / Self Care | Payer: Medicaid Other | Attending: Emergency Medicine | Admitting: Emergency Medicine

## 2019-03-29 DIAGNOSIS — Z5321 Procedure and treatment not carried out due to patient leaving prior to being seen by health care provider: Secondary | ICD-10-CM | POA: Insufficient documentation

## 2019-03-29 DIAGNOSIS — R51 Headache: Secondary | ICD-10-CM | POA: Diagnosis present
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
## 2019-04-05 ENCOUNTER — Emergency Department (HOSPITAL_COMMUNITY)
Admission: EM | Admit: 2019-04-05 | Discharge: 2019-04-06 | Disposition: A | Discharge status: Home / Self Care | Payer: Medicaid Other | Attending: Emergency Medicine | Admitting: Emergency Medicine

## 2019-04-05 DIAGNOSIS — Y939 Activity, unspecified: Secondary | ICD-10-CM | POA: Diagnosis not present

## 2019-04-05 DIAGNOSIS — Z590 Homelessness: Secondary | ICD-10-CM | POA: Diagnosis not present

## 2019-04-05 DIAGNOSIS — Y999 Unspecified external cause status: Secondary | ICD-10-CM | POA: Insufficient documentation

## 2019-04-05 DIAGNOSIS — Y929 Unspecified place or not applicable: Secondary | ICD-10-CM | POA: Insufficient documentation

## 2019-04-05 DIAGNOSIS — S0990XA Unspecified injury of head, initial encounter: Secondary | ICD-10-CM | POA: Diagnosis present

## 2019-04-05 DIAGNOSIS — F17210 Nicotine dependence, cigarettes, uncomplicated: Secondary | ICD-10-CM | POA: Insufficient documentation

## 2019-04-05 DIAGNOSIS — S0101XA Laceration without foreign body of scalp, initial encounter: Secondary | ICD-10-CM | POA: Insufficient documentation

## 2019-04-06 ENCOUNTER — Encounter (HOSPITAL_COMMUNITY): Payer: Self-pay | Admitting: Emergency Medicine

## 2019-04-06 ENCOUNTER — Emergency Department (HOSPITAL_COMMUNITY): Payer: Medicaid Other

## 2019-04-06 ENCOUNTER — Other Ambulatory Visit: Payer: Self-pay

## 2019-04-06 LAB — CBC WITH DIFFERENTIAL/PLATELET
Abs Immature Granulocytes: 0.03 10*3/uL (ref 0.00–0.07)
Basophils Absolute: 0.1 10*3/uL (ref 0.0–0.1)
Basophils Relative: 1 %
Eosinophils Absolute: 0.1 10*3/uL (ref 0.0–0.5)
Eosinophils Relative: 1 %
HCT: 37.8 % (ref 36.0–46.0)
Hemoglobin: 12.6 g/dL (ref 12.0–15.0)
Immature Granulocytes: 0 %
Lymphocytes Relative: 26 %
Lymphs Abs: 2.7 10*3/uL (ref 0.7–4.0)
MCH: 31 pg (ref 26.0–34.0)
MCHC: 33.3 g/dL (ref 30.0–36.0)
MCV: 92.9 fL (ref 80.0–100.0)
Monocytes Absolute: 0.4 10*3/uL (ref 0.1–1.0)
Monocytes Relative: 4 %
Neutro Abs: 7.2 10*3/uL (ref 1.7–7.7)
Neutrophils Relative %: 68 %
Platelets: 213 10*3/uL (ref 150–400)
RBC: 4.07 MIL/uL (ref 3.87–5.11)
RDW: 15.1 % (ref 11.5–15.5)
WBC: 10.5 10*3/uL (ref 4.0–10.5)
nRBC: 0 % (ref 0.0–0.2)

## 2019-04-06 LAB — BASIC METABOLIC PANEL
Anion gap: 19 — ABNORMAL HIGH (ref 5–15)
BUN: 7 mg/dL — ABNORMAL LOW (ref 8–23)
CO2: 21 mmol/L — ABNORMAL LOW (ref 22–32)
Calcium: 10.5 mg/dL — ABNORMAL HIGH (ref 8.9–10.3)
Chloride: 95 mmol/L — ABNORMAL LOW (ref 98–111)
Creatinine, Ser: 0.7 mg/dL (ref 0.44–1.00)
GFR calc Af Amer: >60 mL/min (ref >60)
GFR calc non Af Amer: >60 mL/min (ref >60)
Glucose, Bld: 113 mg/dL — ABNORMAL HIGH (ref 70–99)
Potassium: 4.3 mmol/L (ref 3.5–5.1)
Sodium: 135 mmol/L (ref 135–145)

## 2019-04-06 LAB — PROTIME-INR
INR: 1.1 (ref 0.8–1.2)
Prothrombin Time: 13.6 seconds (ref 11.4–15.2)

## 2019-04-06 LAB — ETHANOL: Alcohol, Ethyl (B): 336 mg/dL (ref <10)

## 2019-04-06 MED ORDER — METOCLOPRAMIDE HCL 10 MG PO TABS: 10.0000 mg | ORAL_TABLET | Freq: Four times a day (QID) | ORAL | 0 refills | Status: DC | PRN

## 2019-04-06 MED ORDER — HYDROCODONE-ACETAMINOPHEN 5-325 MG PO TABS
1.0000 | ORAL_TABLET | Freq: Once | ORAL | Status: AC
  Administered 2019-04-06: 1 via ORAL
  Filled 2019-04-06: qty 1

## 2019-04-06 MED ORDER — LIDOCAINE-EPINEPHRINE (PF) 2 %-1:200000 IJ SOLN
20.0000 mL | Freq: Once | INTRAMUSCULAR | Status: AC
  Administered 2019-04-06: 20 mL
  Filled 2019-04-06: qty 20

## 2019-04-06 MED ORDER — FENTANYL CITRATE (PF) 100 MCG/2ML IJ SOLN
50.0000 ug | Freq: Once | INTRAMUSCULAR | Status: AC
  Administered 2019-04-06: 02:00:00 50 ug via INTRAVENOUS
  Filled 2019-04-06 (×2): qty 2

## 2019-04-06 MED ORDER — HYDROCODONE-ACETAMINOPHEN 5-325 MG PO TABS: 1.0000 | ORAL_TABLET | Freq: Four times a day (QID) | ORAL | 0 refills | Status: DC | PRN

## 2019-04-06 MED ORDER — ONDANSETRON 4 MG PO TBDP
4.0000 mg | ORAL_TABLET | Freq: Once | ORAL | Status: AC
  Administered 2019-04-06: 04:00:00 4 mg via ORAL
  Filled 2019-04-06: qty 1

## 2019-04-06 MED ORDER — METOCLOPRAMIDE HCL 10 MG PO TABS
5.0000 mg | ORAL_TABLET | Freq: Once | ORAL | Status: AC
  Administered 2019-04-06: 5 mg via ORAL
  Filled 2019-04-06: qty 1
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
## 2019-04-06 NOTE — Discharge Instructions (Addendum)
As discussed as not able to fully close your wounds secondary to the extensive swelling in the back of your head.  This will cause the scarring to be a little bit worse than previously but should heal fine otherwise.  Please keep the wounds clean and dry as much as possible if you must cover them and put some type of Vaseline or Neosporin on it.
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
## 2019-04-06 NOTE — ED Notes (Signed)
Suture cart at bedside 

## 2019-04-06 NOTE — ED Notes (Signed)
Pt able to ambulate on the hallway with steady gate.

## 2019-05-02 ENCOUNTER — Encounter (HOSPITAL_COMMUNITY): Payer: Self-pay

## 2019-05-02 ENCOUNTER — Other Ambulatory Visit: Payer: Self-pay

## 2019-05-02 ENCOUNTER — Emergency Department (HOSPITAL_COMMUNITY)
Admission: EM | Admit: 2019-05-02 | Discharge: 2019-05-02 | Disposition: A | Discharge status: Home / Self Care | Payer: Medicaid Other | Attending: Emergency Medicine | Admitting: Emergency Medicine

## 2019-05-02 DIAGNOSIS — Z79899 Other long term (current) drug therapy: Secondary | ICD-10-CM | POA: Insufficient documentation

## 2019-05-02 DIAGNOSIS — S0101XD Laceration without foreign body of scalp, subsequent encounter: Secondary | ICD-10-CM | POA: Diagnosis present

## 2019-05-02 DIAGNOSIS — R42 Dizziness and giddiness: Secondary | ICD-10-CM | POA: Diagnosis not present

## 2019-05-02 DIAGNOSIS — R51 Headache: Secondary | ICD-10-CM | POA: Diagnosis not present

## 2019-05-02 DIAGNOSIS — Z5189 Encounter for other specified aftercare: Secondary | ICD-10-CM

## 2019-05-02 DIAGNOSIS — Z4802 Encounter for removal of sutures: Secondary | ICD-10-CM | POA: Diagnosis not present

## 2019-05-02 DIAGNOSIS — F17210 Nicotine dependence, cigarettes, uncomplicated: Secondary | ICD-10-CM | POA: Insufficient documentation

## 2019-05-02 LAB — BASIC METABOLIC PANEL
Anion gap: 14 (ref 5–15)
BUN: <5 mg/dL — ABNORMAL LOW (ref 8–23)
CO2: 18 mmol/L — ABNORMAL LOW (ref 22–32)
Calcium: 8.7 mg/dL — ABNORMAL LOW (ref 8.9–10.3)
Chloride: 101 mmol/L (ref 98–111)
Creatinine, Ser: 0.52 mg/dL (ref 0.44–1.00)
GFR calc Af Amer: >60 mL/min (ref >60)
GFR calc non Af Amer: >60 mL/min (ref >60)
Glucose, Bld: 114 mg/dL — ABNORMAL HIGH (ref 70–99)
Potassium: 3.4 mmol/L — ABNORMAL LOW (ref 3.5–5.1)
Sodium: 133 mmol/L — ABNORMAL LOW (ref 135–145)

## 2019-05-02 LAB — CBC
HCT: 37.7 % (ref 36.0–46.0)
Hemoglobin: 11.9 g/dL — ABNORMAL LOW (ref 12.0–15.0)
MCH: 29.5 pg (ref 26.0–34.0)
MCHC: 31.6 g/dL (ref 30.0–36.0)
MCV: 93.3 fL (ref 80.0–100.0)
Platelets: 202 10*3/uL (ref 150–400)
RBC: 4.04 MIL/uL (ref 3.87–5.11)
RDW: 16.7 % — ABNORMAL HIGH (ref 11.5–15.5)
WBC: 3.8 10*3/uL — ABNORMAL LOW (ref 4.0–10.5)
nRBC: 0 % (ref 0.0–0.2)

## 2019-05-02 MED ORDER — ONDANSETRON 4 MG PO TBDP
4.0000 mg | ORAL_TABLET | Freq: Once | ORAL | Status: AC
  Administered 2019-05-02: 4 mg via ORAL
  Filled 2019-05-02: qty 1

## 2019-05-02 MED ORDER — SODIUM CHLORIDE 0.9% FLUSH: 3.0000 mL | Freq: Once | INTRAVENOUS | Status: DC

## 2019-05-02 NOTE — ED Notes (Signed)
Pt given dc instructions pt verbalizes understanding. Pt wheeled out to the lobby by wheelchair.
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
## 2019-05-08 ENCOUNTER — Encounter (HOSPITAL_COMMUNITY): Payer: Self-pay

## 2019-05-08 ENCOUNTER — Emergency Department (HOSPITAL_COMMUNITY): Payer: Medicaid Other

## 2019-05-08 ENCOUNTER — Other Ambulatory Visit: Payer: Self-pay

## 2019-05-08 ENCOUNTER — Emergency Department (HOSPITAL_COMMUNITY)
Admission: EM | Admit: 2019-05-08 | Discharge: 2019-05-09 | Disposition: A | Discharge status: Home / Self Care | Payer: Medicaid Other | Attending: Emergency Medicine | Admitting: Emergency Medicine

## 2019-05-08 DIAGNOSIS — F0781 Postconcussional syndrome: Secondary | ICD-10-CM

## 2019-05-08 DIAGNOSIS — R5381 Other malaise: Secondary | ICD-10-CM | POA: Diagnosis not present

## 2019-05-08 DIAGNOSIS — R42 Dizziness and giddiness: Secondary | ICD-10-CM | POA: Diagnosis present

## 2019-05-08 DIAGNOSIS — F17210 Nicotine dependence, cigarettes, uncomplicated: Secondary | ICD-10-CM | POA: Insufficient documentation

## 2019-05-08 DIAGNOSIS — Z79899 Other long term (current) drug therapy: Secondary | ICD-10-CM | POA: Diagnosis not present

## 2019-05-08 LAB — URINALYSIS, ROUTINE W REFLEX MICROSCOPIC
Bacteria, UA: NONE SEEN
Bilirubin Urine: NEGATIVE
Glucose, UA: NEGATIVE mg/dL
Hgb urine dipstick: NEGATIVE
Ketones, ur: NEGATIVE mg/dL
Leukocytes,Ua: NEGATIVE
Nitrite: NEGATIVE
Protein, ur: NEGATIVE mg/dL
Specific Gravity, Urine: 1.001 — ABNORMAL LOW (ref 1.005–1.030)
pH: 7 (ref 5.0–8.0)

## 2019-05-08 LAB — RAPID URINE DRUG SCREEN, HOSP PERFORMED
Amphetamines: NOT DETECTED
Barbiturates: NOT DETECTED
Benzodiazepines: NOT DETECTED
Cocaine: NOT DETECTED
Opiates: NOT DETECTED
Tetrahydrocannabinol: NOT DETECTED

## 2019-05-08 LAB — CBC WITH DIFFERENTIAL/PLATELET
Abs Immature Granulocytes: 0.01 10*3/uL (ref 0.00–0.07)
Basophils Absolute: 0 10*3/uL (ref 0.0–0.1)
Basophils Relative: 1 %
Eosinophils Absolute: 0 10*3/uL (ref 0.0–0.5)
Eosinophils Relative: 1 %
HCT: 34.4 % — ABNORMAL LOW (ref 36.0–46.0)
Hemoglobin: 10.6 g/dL — ABNORMAL LOW (ref 12.0–15.0)
Immature Granulocytes: 0 %
Lymphocytes Relative: 43 %
Lymphs Abs: 1.7 10*3/uL (ref 0.7–4.0)
MCH: 29.1 pg (ref 26.0–34.0)
MCHC: 30.8 g/dL (ref 30.0–36.0)
MCV: 94.5 fL (ref 80.0–100.0)
Monocytes Absolute: 0.6 10*3/uL (ref 0.1–1.0)
Monocytes Relative: 15 %
Neutro Abs: 1.5 10*3/uL — ABNORMAL LOW (ref 1.7–7.7)
Neutrophils Relative %: 40 %
Platelets: 192 10*3/uL (ref 150–400)
RBC: 3.64 MIL/uL — ABNORMAL LOW (ref 3.87–5.11)
RDW: 17.1 % — ABNORMAL HIGH (ref 11.5–15.5)
WBC: 3.9 10*3/uL — ABNORMAL LOW (ref 4.0–10.5)
nRBC: 0 % (ref 0.0–0.2)

## 2019-05-08 MED ORDER — HYDROCODONE-ACETAMINOPHEN 5-325 MG PO TABS
1.0000 | ORAL_TABLET | Freq: Once | ORAL | Status: AC
  Administered 2019-05-08: 23:00:00 1 via ORAL
  Filled 2019-05-08: qty 1

## 2019-05-08 MED ORDER — SODIUM CHLORIDE 0.9 % IV BOLUS
500.0000 mL | Freq: Once | INTRAVENOUS | Status: AC
  Administered 2019-05-08: 500 mL via INTRAVENOUS
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
## 2019-05-08 NOTE — ED Triage Notes (Signed)
Pt BIBA from home. Pt has multiple sources of chronic pain: head, neck, ankle, tremors, hands, nausea, etc.  Pt requesting 3 5-325 oxy.

## 2019-05-08 NOTE — ED Notes (Addendum)
Pt dressed into a gown and on monitor. Pt has call bell within reach and knows to call staff if assistance is needed.

## 2019-05-08 NOTE — ED Notes (Signed)
Pt ambulatory from triage to room 11

## 2019-05-08 NOTE — ED Notes (Signed)
Pt asked security to recheck her vital signs claiming she was feeling like she was going to pass out. This Probation officer check pt's vitals and pt said she was feeling dizzy and claims she had a seizure at 1600.

## 2019-05-09 LAB — COMPREHENSIVE METABOLIC PANEL
ALT: 28 U/L (ref 0–44)
AST: 36 U/L (ref 15–41)
Albumin: 4 g/dL (ref 3.5–5.0)
Alkaline Phosphatase: 77 U/L (ref 38–126)
Anion gap: 12 (ref 5–15)
BUN: <5 mg/dL — ABNORMAL LOW (ref 8–23)
CO2: 25 mmol/L (ref 22–32)
Calcium: 9.1 mg/dL (ref 8.9–10.3)
Chloride: 96 mmol/L — ABNORMAL LOW (ref 98–111)
Creatinine, Ser: 0.79 mg/dL (ref 0.44–1.00)
GFR calc Af Amer: >60 mL/min (ref >60)
GFR calc non Af Amer: >60 mL/min (ref >60)
Glucose, Bld: 103 mg/dL — ABNORMAL HIGH (ref 70–99)
Potassium: 3 mmol/L — ABNORMAL LOW (ref 3.5–5.1)
Sodium: 133 mmol/L — ABNORMAL LOW (ref 135–145)
Total Bilirubin: 0.7 mg/dL (ref 0.3–1.2)
Total Protein: 6.7 g/dL (ref 6.5–8.1)

## 2019-05-09 LAB — LIPASE, BLOOD: Lipase: 50 U/L (ref 11–51)

## 2019-05-09 MED ORDER — ACETAMINOPHEN 325 MG PO TABS
650.0000 mg | ORAL_TABLET | Freq: Once | ORAL | Status: AC
  Administered 2019-05-09: 650 mg via ORAL
  Filled 2019-05-09: qty 2

## 2019-05-09 MED ORDER — ONDANSETRON 4 MG PO TBDP
4.0000 mg | ORAL_TABLET | Freq: Once | ORAL | Status: AC
  Administered 2019-05-09: 4 mg via ORAL
  Filled 2019-05-09: qty 1

## 2019-05-09 MED ORDER — POTASSIUM CHLORIDE CRYS ER 20 MEQ PO TBCR
60.0000 meq | EXTENDED_RELEASE_TABLET | Freq: Once | ORAL | Status: AC
  Administered 2019-05-09: 01:00:00 60 meq via ORAL
  Filled 2019-05-09: qty 3

## 2019-05-09 NOTE — ED Notes (Signed)
Pt given fluid and crackers. Pt knows to notify staff with how she tolerates food/fluids.

## 2019-05-09 NOTE — ED Notes (Signed)
Reviewed discharge instructions with pt. Pt verbalized understanding. Pt provided with sandwich, crackers and drink. Pt ambulated to lobby with steady gait.

## 2019-05-09 NOTE — ED Notes (Signed)
Pt tolerated PO challenge
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
## 2019-05-15 ENCOUNTER — Encounter (HOSPITAL_COMMUNITY): Payer: Self-pay

## 2019-05-15 ENCOUNTER — Ambulatory Visit (HOSPITAL_COMMUNITY)
Admission: EM | Admit: 2019-05-15 | Discharge: 2019-05-15 | Disposition: A | Discharge status: Home / Self Care | Payer: Medicaid Other | Attending: Emergency Medicine | Admitting: Emergency Medicine

## 2019-05-15 ENCOUNTER — Other Ambulatory Visit: Payer: Self-pay

## 2019-05-15 DIAGNOSIS — L0291 Cutaneous abscess, unspecified: Secondary | ICD-10-CM

## 2019-05-15 MED ORDER — MUPIROCIN CALCIUM 2 % EX CREA: 1.0000 "application " | TOPICAL_CREAM | Freq: Two times a day (BID) | CUTANEOUS | 0 refills | Status: DC

## 2019-05-15 MED ORDER — HYDROCODONE-ACETAMINOPHEN 5-325 MG PO TABS: 2.0000 | ORAL_TABLET | ORAL | 0 refills | Status: DC | PRN

## 2019-05-15 MED ORDER — DOXYCYCLINE HYCLATE 100 MG PO CAPS: 100.0000 mg | ORAL_CAPSULE | Freq: Two times a day (BID) | ORAL | 0 refills | Status: DC

## 2019-05-15 MED ORDER — LIDOCAINE-EPINEPHRINE (PF) 2 %-1:200000 IJ SOLN
INTRAMUSCULAR | Status: AC
  Filled 2019-05-15: qty 20
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
## 2019-05-15 NOTE — Discharge Instructions (Signed)
Keep your wound clean and dry; wash it gently twice a day with soap and water.  Apply the antibiotic cream prescribed and a bandage twice a day.    Take the antibiotic doxycycline twice a day for 10 days.    Follow-up with the surgeon listed in 2 to 3 days for wound recheck.
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
## 2019-05-24 ENCOUNTER — Other Ambulatory Visit: Payer: Self-pay

## 2019-05-24 ENCOUNTER — Ambulatory Visit (HOSPITAL_COMMUNITY)
Admission: EM | Admit: 2019-05-24 | Discharge: 2019-05-24 | Disposition: A | Discharge status: Home / Self Care | Payer: Medicaid Other | Attending: Emergency Medicine | Admitting: Emergency Medicine

## 2019-05-24 ENCOUNTER — Encounter (HOSPITAL_COMMUNITY): Payer: Self-pay | Admitting: Emergency Medicine

## 2019-05-24 DIAGNOSIS — G44329 Chronic post-traumatic headache, not intractable: Secondary | ICD-10-CM | POA: Diagnosis not present

## 2019-05-24 MED ORDER — MELOXICAM 7.5 MG PO TABS: 7.5000 mg | ORAL_TABLET | Freq: Every day | ORAL | 0 refills | Status: DC

## 2019-05-24 NOTE — Discharge Instructions (Signed)
May try daily meloxicam. Don't take additional ibuprofen or aleve.  Continue with daily wound care.  Please follow up with a PCP to establish care and manage your medications.

## 2019-05-24 NOTE — ED Provider Notes (Signed)
[facility]    CSN: [id number] Arrival date & time: 05/24/19  1513     History   Chief Complaint Chief Complaint  Patient presents with  . Headache    HPI [name] is a 63 y.o. female.   [name] presents with complaints of headache. She has had frequent headaches, especially in relation to two different head trauma's. Has had three head CT's since 4/22. Last head ct was 7/20, negative. This is not a new or changing of headache. Her dizziness has improved. No vision changes. Has been taking aleve which helps some. She was seen here 7/27 and had I&D to the area which had been sutured previously, due to abscess presence. Finished the antibiotics and has been applying topical ointment as well. Without significant drainage. Did go see a Careers advisersurgeon and was told "there is nothing else I can do."  She states she was discharged as a patient from her p[facility]. She is requesting medication refills today, also requesting tylenol with codeine refill. She is allergic to toradol, states she has been taking aleve and tolerates, however. History of alcohol dependence, anxiety, chronic pain, depression, seizures, delirium tremens.     ROS per HPI, negative if not otherwise mentioned.      Past Medical History:  Diagnosis Date  . Alcohol dependence (HCC)   . Anxiety   . Chronic pain   . Depression   . Diverticulitis   . Herniated cervical disc   . Seizures (HCC)    alcoholic seizures    Patient Active Problem List   Diagnosis Date Noted  . Suicidal ideation   . Delirium tremens (HCC) 12/13/2018  . Thrombocytopenia (HCC) 12/13/2018  . Homeless 12/13/2018  . Depression 12/13/2018  . Neuropathy 12/13/2018  . Subarachnoid hemorrhage (HCC) 12/11/2018  . Seizure due to alcohol withdrawal (HCC) 12/11/2018  . Alcohol dependence with alcohol-induced mood disorder (HCC)   . 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  Major depressive disorder, 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    Past Surgical History:  Procedure Laterality Date  . ABDOMINAL HYSTERECTOMY    . ANKLE SURGERY Right   . CARPAL TUNNEL RELEASE Bilateral   . CERVICAL FUSION    . CHOLECYSTECTOMY    . KNEE SURGERY      OB History   No obstetric history on file.      Home Medications    Prior to Admission medications   Medication Sig Start Date End Date Taking? Authorizing Provider  acetaminophen (TYLENOL) 500 MG tablet Take 1 tablet (500 mg total) by mouth every 6 (six) hours as needed for moderate pain. 02/16/19  Yes Robinson, [place] N, PA-C  FLUoxetine (PROZAC) 20 MG tablet Take 1 tablet (20 mg total) by mouth daily. 01/05/19  Yes Elvina SidleLauenstein, Kurt, MD  pantoprazole (PROTONIX) 40 MG tablet Take 1 tablet (40 mg total) by mouth daily. 12/28/18  Yes Money, Gerlene Burdockravis B, FNP  gabapentin (NEURONTIN) 400 MG capsule Take 1 capsule (400 mg total) by mouth 3 (three) times daily. Patient taking differently: Take 400 mg by mouth 3 (three) times daily. Ran out of this medicine 01/05/19   Elvina SidleLauenstein, Kurt, MD  gabapentin (NEURONTIN) 800 MG tablet Take 800 mg by mouth 3 (three) times daily. 01/16/19   [provider]  levETIRAcetam (KEPPRA) 500 MG tablet Take 1 tablet (500 mg total) by mouth 2 (two) times daily for 30 days. 12/27/18 01/26/19  Money, Gerlene Burdockravis B, FNP  meloxicam (MOBIC) 7.5 MG tablet Take 1 tablet (7.5 mg total) by mouth daily. 05/24/19   Georgetta HaberBurky,  B, NP  metoCLOPramide (REGLAN) 10 MG tablet Take 1 tablet (10 mg total) by mouth every 6 (six) hours as needed for nausea (nausea/headache). 04/06/19 05/24/19  Mesner, Barbara CowerJason, MD    Family History History reviewed. No pertinent family history.  Social History Social History   Tobacco Use  . Smoking status: Current Every Day Smoker    Packs/day: 0.50    Years: 7.00    Pack years: 3.50    Types: Cigarettes  . Smokeless tobacco: Never Used  Substance Use Topics   . Alcohol use: Yes    Comment: drinks a fifth of vodka daily  . Drug use: Not Currently    Types: Marijuana     Allergies   Cephalexin, Ketorolac, and Tramadol   Review of Systems Review of Systems   Physical Exam Triage Vital Signs ED Triage Vitals  Enc Vitals Group     BP 05/24/19 1603 (!) 151/95     Pulse Rate 05/24/19 1603 68     Resp 05/24/19 1603 18     Temp 05/24/19 1603 98.4 F (36.9 C)     Temp Source 05/24/19 1603 Oral     SpO2 05/24/19 1603 100 %     Weight --      Height --      Head Circumference --      Peak Flow --      Pain Score 05/24/19 1557 8     Pain Loc --      Pain Edu? --      Excl. in GC? --    No data found.  Updated Vital Signs BP (!) 151/95 (BP Location: Left Arm)   Pulse 68   Temp 98.4 F (36.9 C) (Oral)   Resp 18   SpO2 100%    Physical Exam Constitutional:      General: She is not in acute distress.    Appearance: She is well-developed.  HENT:     Head:     Comments: Healing wound, no obvious fluctuance, some granulated tissue still present but no significant redness or drainage visualized. See photo  Cardiovascular:     Rate and Rhythm: Normal rate.     Heart sounds: Normal heart sounds.  Pulmonary:     Effort: Pulmonary effort is normal.  Skin:    General: Skin is warm.  Neurological:     Mental Status: She is alert and oriented to person, place, and time.     Cranial Nerves: No cranial nerve deficit.     Sensory: No sensory deficit.        UC Treatments / Results  Labs (all labs ordered are listed, but only abnormal results are displayed) Labs Reviewed - No data to display  EKG   Radiology No results found.  Procedures Procedures (including critical care time)  Medications Ordered in UC Medications - No data to display  Initial Impression / Assessment and Plan / UC Course  I have reviewed the triage vital signs and the nursing notes.  Pertinent labs & imaging results that were available during  my care of the patient were reviewed by me and considered in my medical decision making (see chart for details).     Posterior head wound appears to be well healing. No acute neurological findings today. Discussed that we won't be continuing controlled substances for headache at his time. meloxicam provided with precautions  given. Resources for PCP provided. Return precautions provided. Patient verbalized understanding and agreeable to plan. Ambulatory out of clinic without difficulty.    Final Clinical Impressions(s) / UC Diagnoses   Final diagnoses:  Chronic post-traumatic headache, not intractable     Discharge Instructions     May try daily meloxicam. Don't take additional ibuprofen or aleve.  Continue with daily wound care.  Please follow up with a PCP to establish care and manage your medications.     ED Prescriptions    Medication Sig Dispense Auth. Provider   meloxicam (MOBIC) 7.5 MG tablet Take 1 tablet (7.5 mg total) by mouth daily. 20 tablet Zigmund Gottron, NP     Controlled Substance Prescriptions [place] Controlled Substance Registry consulted? Not Applicable   Zigmund Gottron, NP 05/24/19 1751

## 2019-05-24 NOTE — ED Triage Notes (Signed)
Patient seen at [facility] 05/15/2019  Patient has seen a surgeon-no change to current care plan  Patient continues to have pain.  No new symptoms

## 2019-06-13 ENCOUNTER — Ambulatory Visit: Payer: Medicaid Other | Admitting: Family Medicine

## 2019-06-29 ENCOUNTER — Ambulatory Visit: Payer: Medicaid Other | Admitting: Family Medicine

## 2019-06-29 NOTE — Progress Notes (Deleted)
   Subjective:    Patient ID: [name], female    DOB: [birth year], 63 y.o.   MRN: [id number]   CC: Establish care  HPI: [name] is a 63 year old female presenting to establish care:  Past medical history:  MDD without psychotic features  History of homelessness  Alcohol use, with history of delirium tremens  Subarachnoid hemorrhage  Chronic pain  Previously followed with [facility], discharged from this practice.  Social history:  Medications:  Previous surgeries:  Significant family history:  Health maintenance: Due for hepatitis C screening, Tdap, Pap smear, mammogram, colonoscopy, and flu shot  PMP aware reviewed --   Smoking status reviewed  Review of Systems Per HPI    Objective:  There were no vitals taken for this visit. Vitals and nursing note reviewed  General: NAD, pleasant Cardiac: RRR, normal heart sounds, no murmurs Respiratory: CTAB, normal effort Abdomen: soft, nontender, nondistended Extremities: no edema or cyanosis. WWP. Skin: warm and dry, no rashes noted Neuro: alert and oriented, no focal deficits Psych: normal affect  Assessment & Plan:    No problem-specific Assessment & Plan notes found for this encounter.    [facility] Resident PGY-2

## 2019-07-14 ENCOUNTER — Other Ambulatory Visit: Payer: Self-pay

## 2019-07-14 ENCOUNTER — Encounter (HOSPITAL_COMMUNITY): Payer: Self-pay

## 2019-07-14 ENCOUNTER — Ambulatory Visit (HOSPITAL_COMMUNITY)
Admission: EM | Admit: 2019-07-14 | Discharge: 2019-07-14 | Disposition: A | Discharge status: Home / Self Care | Payer: Medicaid Other | Attending: Emergency Medicine | Admitting: Emergency Medicine

## 2019-07-14 DIAGNOSIS — M545 Low back pain, unspecified: Secondary | ICD-10-CM

## 2019-07-14 DIAGNOSIS — G8929 Other chronic pain: Secondary | ICD-10-CM

## 2019-07-14 DIAGNOSIS — R300 Dysuria: Secondary | ICD-10-CM | POA: Diagnosis not present

## 2019-07-14 DIAGNOSIS — N898 Other specified noninflammatory disorders of vagina: Secondary | ICD-10-CM

## 2019-07-14 DIAGNOSIS — R1032 Left lower quadrant pain: Secondary | ICD-10-CM | POA: Diagnosis not present

## 2019-07-14 LAB — POCT URINALYSIS DIP (DEVICE)
Bilirubin Urine: NEGATIVE
Glucose, UA: NEGATIVE mg/dL
Hgb urine dipstick: NEGATIVE
Ketones, ur: NEGATIVE mg/dL
Leukocytes,Ua: NEGATIVE
Nitrite: NEGATIVE
Protein, ur: NEGATIVE mg/dL
Specific Gravity, Urine: 1.01 (ref 1.005–1.030)
Urobilinogen, UA: 0.2 mg/dL (ref 0.0–1.0)
pH: 7 (ref 5.0–8.0)

## 2019-07-14 MED ORDER — MICONAZOLE NITRATE 2 % EX CREA: 1.0000 "application " | TOPICAL_CREAM | Freq: Two times a day (BID) | CUTANEOUS | 0 refills | Status: DC
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
## 2019-07-14 NOTE — Discharge Instructions (Signed)
Apply the prescribed cream to your perineal area for the irritation.    Stay hydrated with 8 to 10 glasses of water each day.    Go to the emergency department if you develop acute worsening pain.  Return here or follow-up with your primary care provider if you have continued symptoms or new symptoms such as fever, chills, vaginal discharge, pelvic pain, vomiting, diarrhea, constipation, or other concerns.
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
## 2019-08-04 ENCOUNTER — Emergency Department (HOSPITAL_COMMUNITY)
Admission: EM | Admit: 2019-08-04 | Discharge: 2019-08-04 | Disposition: A | Discharge status: Home / Self Care | Payer: Medicaid Other | Attending: Emergency Medicine | Admitting: Emergency Medicine

## 2019-08-04 ENCOUNTER — Emergency Department (HOSPITAL_COMMUNITY): Payer: Medicaid Other

## 2019-08-04 ENCOUNTER — Other Ambulatory Visit: Payer: Self-pay

## 2019-08-04 ENCOUNTER — Encounter (HOSPITAL_COMMUNITY): Payer: Self-pay | Admitting: Emergency Medicine

## 2019-08-04 DIAGNOSIS — M791 Myalgia, unspecified site: Secondary | ICD-10-CM | POA: Diagnosis not present

## 2019-08-04 DIAGNOSIS — R059 Cough, unspecified: Secondary | ICD-10-CM

## 2019-08-04 DIAGNOSIS — R110 Nausea: Secondary | ICD-10-CM | POA: Insufficient documentation

## 2019-08-04 DIAGNOSIS — R5383 Other fatigue: Secondary | ICD-10-CM | POA: Diagnosis not present

## 2019-08-04 DIAGNOSIS — R531 Weakness: Secondary | ICD-10-CM | POA: Insufficient documentation

## 2019-08-04 DIAGNOSIS — F17210 Nicotine dependence, cigarettes, uncomplicated: Secondary | ICD-10-CM | POA: Insufficient documentation

## 2019-08-04 DIAGNOSIS — Z20828 Contact with and (suspected) exposure to other viral communicable diseases: Secondary | ICD-10-CM | POA: Diagnosis not present

## 2019-08-04 DIAGNOSIS — R509 Fever, unspecified: Secondary | ICD-10-CM | POA: Diagnosis not present

## 2019-08-04 DIAGNOSIS — R519 Headache, unspecified: Secondary | ICD-10-CM | POA: Insufficient documentation

## 2019-08-04 DIAGNOSIS — M25559 Pain in unspecified hip: Secondary | ICD-10-CM | POA: Insufficient documentation

## 2019-08-04 DIAGNOSIS — R05 Cough: Secondary | ICD-10-CM | POA: Diagnosis not present

## 2019-08-04 DIAGNOSIS — Z79899 Other long term (current) drug therapy: Secondary | ICD-10-CM | POA: Diagnosis not present

## 2019-08-04 DIAGNOSIS — M542 Cervicalgia: Secondary | ICD-10-CM | POA: Insufficient documentation

## 2019-08-04 DIAGNOSIS — R630 Anorexia: Secondary | ICD-10-CM | POA: Insufficient documentation

## 2019-08-04 DIAGNOSIS — R197 Diarrhea, unspecified: Secondary | ICD-10-CM | POA: Insufficient documentation

## 2019-08-04 DIAGNOSIS — R42 Dizziness and giddiness: Secondary | ICD-10-CM | POA: Insufficient documentation

## 2019-08-04 HISTORY — DX: Acute pancreatitis without necrosis or infection, unspecified: K85.90

## 2019-08-04 LAB — COMPREHENSIVE METABOLIC PANEL
ALT: 10 U/L (ref 0–44)
AST: 19 U/L (ref 15–41)
Albumin: 3.9 g/dL (ref 3.5–5.0)
Alkaline Phosphatase: 51 U/L (ref 38–126)
Anion gap: 10 (ref 5–15)
BUN: 5 mg/dL — ABNORMAL LOW (ref 8–23)
CO2: 21 mmol/L — ABNORMAL LOW (ref 22–32)
Calcium: 9.4 mg/dL (ref 8.9–10.3)
Chloride: 107 mmol/L (ref 98–111)
Creatinine, Ser: 0.61 mg/dL (ref 0.44–1.00)
GFR calc Af Amer: >60 mL/min (ref >60)
GFR calc non Af Amer: >60 mL/min (ref >60)
Glucose, Bld: 149 mg/dL — ABNORMAL HIGH (ref 70–99)
Potassium: 3.6 mmol/L (ref 3.5–5.1)
Sodium: 138 mmol/L (ref 135–145)
Total Bilirubin: 0.3 mg/dL (ref 0.3–1.2)
Total Protein: 6.6 g/dL (ref 6.5–8.1)

## 2019-08-04 LAB — CBC
HCT: 35.7 % — ABNORMAL LOW (ref 36.0–46.0)
Hemoglobin: 11.4 g/dL — ABNORMAL LOW (ref 12.0–15.0)
MCH: 29.5 pg (ref 26.0–34.0)
MCHC: 31.9 g/dL (ref 30.0–36.0)
MCV: 92.2 fL (ref 80.0–100.0)
Platelets: 200 10*3/uL (ref 150–400)
RBC: 3.87 MIL/uL (ref 3.87–5.11)
RDW: 20.3 % — ABNORMAL HIGH (ref 11.5–15.5)
WBC: 6 10*3/uL (ref 4.0–10.5)
nRBC: 0 % (ref 0.0–0.2)

## 2019-08-04 LAB — LIPASE, BLOOD: Lipase: 47 U/L (ref 11–51)

## 2019-08-04 LAB — URINALYSIS, ROUTINE W REFLEX MICROSCOPIC
Bilirubin Urine: NEGATIVE
Glucose, UA: NEGATIVE mg/dL
Hgb urine dipstick: NEGATIVE
Ketones, ur: NEGATIVE mg/dL
Leukocytes,Ua: NEGATIVE
Nitrite: NEGATIVE
Protein, ur: NEGATIVE mg/dL
Specific Gravity, Urine: 1.016 (ref 1.005–1.030)
pH: 6 (ref 5.0–8.0)

## 2019-08-04 MED ORDER — GABAPENTIN 100 MG PO CAPS: 200.0000 mg | ORAL_CAPSULE | Freq: Three times a day (TID) | ORAL | Status: DC

## 2019-08-04 MED ORDER — GABAPENTIN 100 MG PO CAPS: 200.0000 mg | ORAL_CAPSULE | Freq: Three times a day (TID) | ORAL | 0 refills | Status: DC

## 2019-08-04 MED ORDER — HYDROCODONE-ACETAMINOPHEN 5-325 MG PO TABS
1.0000 | ORAL_TABLET | Freq: Once | ORAL | Status: AC
  Administered 2019-08-04: 1 via ORAL
  Filled 2019-08-04: qty 1

## 2019-08-04 MED ORDER — SODIUM CHLORIDE 0.9% FLUSH
3.0000 mL | Freq: Once | INTRAVENOUS | Status: AC
  Administered 2019-08-04: 3 mL via INTRAVENOUS

## 2019-08-04 MED ORDER — ONDANSETRON 4 MG PO TBDP
4.0000 mg | ORAL_TABLET | Freq: Once | ORAL | Status: AC
  Administered 2019-08-04: 4 mg via ORAL
  Filled 2019-08-04: qty 1

## 2019-08-04 MED ORDER — PANTOPRAZOLE SODIUM 40 MG PO TBEC: 40.0000 mg | DELAYED_RELEASE_TABLET | Freq: Every day | ORAL | Status: DC

## 2019-08-04 MED ORDER — PANTOPRAZOLE SODIUM 40 MG PO TBEC: 40.0000 mg | DELAYED_RELEASE_TABLET | Freq: Every day | ORAL | 0 refills | Status: DC

## 2019-08-04 NOTE — Discharge Instructions (Addendum)
Your gabapentin and pantoprazole has been reordered. Please take, as prescribed.   Please return to the ED or seek medical attention should you develop any fevers or chills uncontrolled with Tylenol or ibuprofen, new or worsening chest pain, shortness of breath, intractable nausea vomiting, or inability to tolerate food or liquid by mouth.  You were given narcotic and or sedative medications while in the emergency department. Do not drive. Do not use machinery or power tools. Do not sign legal documents. Do not drink alcohol. Do not take sleeping pills. Do not supervise children by yourself. Do not participate in activities that require climbing or being in high places.

## 2019-08-04 NOTE — ED Triage Notes (Addendum)
Left sided abd pain , diziness , loer back pain hip pain and weakness n/v and fever states is out all her meds since july
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
## 2019-08-05 LAB — NOVEL CORONAVIRUS, NAA (HOSP ORDER, SEND-OUT TO REF LAB; TAT 18-24 HRS): SARS-CoV-2, NAA: NOT DETECTED

## 2019-08-07 ENCOUNTER — Telehealth (HOSPITAL_COMMUNITY): Payer: Self-pay

## 2019-10-10 ENCOUNTER — Other Ambulatory Visit: Payer: Self-pay | Admitting: Nurse Practitioner

## 2019-10-10 DIAGNOSIS — Z1231 Encounter for screening mammogram for malignant neoplasm of breast: Secondary | ICD-10-CM

## 2019-11-24 ENCOUNTER — Ambulatory Visit: Payer: Medicaid Other

## 2020-01-01 ENCOUNTER — Ambulatory Visit: Payer: Medicaid Other

## 2020-02-01 ENCOUNTER — Ambulatory Visit: Payer: Medicaid Other

## 2020-02-28 ENCOUNTER — Inpatient Hospital Stay (HOSPITAL_COMMUNITY)
Admission: EM | Admit: 2020-02-28 | Discharge: 2020-03-04 | DRG: 392 | Disposition: A | Discharge status: Home / Self Care | Payer: Medicaid Other | Attending: Internal Medicine | Admitting: Internal Medicine

## 2020-02-28 ENCOUNTER — Emergency Department (HOSPITAL_COMMUNITY): Payer: Medicaid Other

## 2020-02-28 ENCOUNTER — Encounter (HOSPITAL_COMMUNITY): Payer: Self-pay | Admitting: Internal Medicine

## 2020-02-28 DIAGNOSIS — G629 Polyneuropathy, unspecified: Secondary | ICD-10-CM

## 2020-02-28 DIAGNOSIS — R630 Anorexia: Secondary | ICD-10-CM | POA: Diagnosis present

## 2020-02-28 DIAGNOSIS — F332 Major depressive disorder, 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 psychotic features: Secondary | ICD-10-CM | POA: Diagnosis present

## 2020-02-28 DIAGNOSIS — F419 Anxiety disorder, unspecified: Secondary | ICD-10-CM | POA: Diagnosis present

## 2020-02-28 DIAGNOSIS — Z888 Allergy status to other drugs, medicaments and biological substances status: Secondary | ICD-10-CM

## 2020-02-28 DIAGNOSIS — K219 Gastro-esophageal reflux disease without esophagitis: Secondary | ICD-10-CM | POA: Diagnosis not present

## 2020-02-28 DIAGNOSIS — K5732 Diverticulitis of large intestine without perforation or abscess without bleeding: Principal | ICD-10-CM | POA: Diagnosis present

## 2020-02-28 DIAGNOSIS — R110 Nausea: Secondary | ICD-10-CM | POA: Diagnosis present

## 2020-02-28 DIAGNOSIS — G4089 Other seizures: Secondary | ICD-10-CM | POA: Diagnosis present

## 2020-02-28 DIAGNOSIS — F1021 Alcohol dependence, in remission: Secondary | ICD-10-CM | POA: Diagnosis present

## 2020-02-28 DIAGNOSIS — Z20822 Contact with and (suspected) exposure to covid-19: Secondary | ICD-10-CM | POA: Diagnosis present

## 2020-02-28 DIAGNOSIS — F1024 Alcohol dependence with alcohol-induced mood disorder: Secondary | ICD-10-CM | POA: Diagnosis present

## 2020-02-28 DIAGNOSIS — Z79899 Other long term (current) drug therapy: Secondary | ICD-10-CM

## 2020-02-28 DIAGNOSIS — G8929 Other chronic pain: Secondary | ICD-10-CM | POA: Diagnosis present

## 2020-02-28 DIAGNOSIS — F17210 Nicotine dependence, cigarettes, uncomplicated: Secondary | ICD-10-CM | POA: Diagnosis present

## 2020-02-28 DIAGNOSIS — K5792 Diverticulitis of intestine, part unspecified, without perforation or abscess without bleeding: Secondary | ICD-10-CM | POA: Diagnosis not present

## 2020-02-28 LAB — COMPREHENSIVE METABOLIC PANEL
ALT: 28 U/L (ref 0–44)
AST: 33 U/L (ref 15–41)
Albumin: 4.2 g/dL (ref 3.5–5.0)
Alkaline Phosphatase: 65 U/L (ref 38–126)
Anion gap: 13 (ref 5–15)
BUN: 6 mg/dL — ABNORMAL LOW (ref 8–23)
CO2: 25 mmol/L (ref 22–32)
Calcium: 9.9 mg/dL (ref 8.9–10.3)
Chloride: 101 mmol/L (ref 98–111)
Creatinine, Ser: 0.71 mg/dL (ref 0.44–1.00)
GFR calc Af Amer: >60 mL/min (ref >60)
GFR calc non Af Amer: >60 mL/min (ref >60)
Glucose, Bld: 89 mg/dL (ref 70–99)
Potassium: 3.8 mmol/L (ref 3.5–5.1)
Sodium: 139 mmol/L (ref 135–145)
Total Bilirubin: 0.8 mg/dL (ref 0.3–1.2)
Total Protein: 7.4 g/dL (ref 6.5–8.1)

## 2020-02-28 LAB — CBC
HCT: 42.2 % (ref 36.0–46.0)
Hemoglobin: 13.5 g/dL (ref 12.0–15.0)
MCH: 32.4 pg (ref 26.0–34.0)
MCHC: 32 g/dL (ref 30.0–36.0)
MCV: 101.2 fL — ABNORMAL HIGH (ref 80.0–100.0)
Platelets: 262 10*3/uL (ref 150–400)
RBC: 4.17 MIL/uL (ref 3.87–5.11)
RDW: 14.6 % (ref 11.5–15.5)
WBC: 6.4 10*3/uL (ref 4.0–10.5)
nRBC: 0 % (ref 0.0–0.2)

## 2020-02-28 LAB — URINALYSIS, ROUTINE W REFLEX MICROSCOPIC
Bilirubin Urine: NEGATIVE
Glucose, UA: NEGATIVE mg/dL
Hgb urine dipstick: NEGATIVE
Ketones, ur: NEGATIVE mg/dL
Leukocytes,Ua: NEGATIVE
Nitrite: NEGATIVE
Protein, ur: NEGATIVE mg/dL
Specific Gravity, Urine: 1.003 — ABNORMAL LOW (ref 1.005–1.030)
pH: 6 (ref 5.0–8.0)

## 2020-02-28 LAB — LIPASE, BLOOD: Lipase: 33 U/L (ref 11–51)

## 2020-02-28 LAB — TROPONIN I (HIGH SENSITIVITY)
Troponin I (High Sensitivity): 3 ng/L (ref <18)
Troponin I (High Sensitivity): 4 ng/L (ref <18)

## 2020-02-28 MED ORDER — ACETAMINOPHEN 325 MG PO TABS: 650.0000 mg | ORAL_TABLET | Freq: Four times a day (QID) | ORAL | Status: DC | PRN

## 2020-02-28 MED ORDER — ALBUTEROL SULFATE (2.5 MG/3ML) 0.083% IN NEBU: 2.5000 mg | INHALATION_SOLUTION | RESPIRATORY_TRACT | Status: DC | PRN

## 2020-02-28 MED ORDER — IOHEXOL 300 MG/ML  SOLN
100.0000 mL | Freq: Once | INTRAMUSCULAR | Status: AC | PRN
  Administered 2020-02-28: 100 mL via INTRAVENOUS

## 2020-02-28 MED ORDER — PANTOPRAZOLE SODIUM 40 MG PO TBEC
40.0000 mg | DELAYED_RELEASE_TABLET | Freq: Every day | ORAL | Status: DC
  Administered 2020-02-29 – 2020-03-03 (×4): 40 mg via ORAL
  Filled 2020-02-28 (×4): qty 1

## 2020-02-28 MED ORDER — SODIUM CHLORIDE 0.9% FLUSH
3.0000 mL | Freq: Once | INTRAVENOUS | Status: AC
  Administered 2020-02-28: 3 mL via INTRAVENOUS

## 2020-02-28 MED ORDER — HYDROCODONE-ACETAMINOPHEN 5-325 MG PO TABS
1.0000 | ORAL_TABLET | ORAL | Status: DC | PRN
  Administered 2020-02-29 – 2020-03-02 (×4): 2 via ORAL
  Filled 2020-02-28 (×4): qty 2

## 2020-02-28 MED ORDER — SODIUM CHLORIDE 0.9 % IV BOLUS
500.0000 mL | Freq: Once | INTRAVENOUS | Status: AC
  Administered 2020-02-28: 500 mL via INTRAVENOUS

## 2020-02-28 MED ORDER — ONDANSETRON HCL 4 MG/2ML IJ SOLN
4.0000 mg | Freq: Four times a day (QID) | INTRAMUSCULAR | Status: DC | PRN
  Administered 2020-02-29 – 2020-03-02 (×5): 4 mg via INTRAVENOUS
  Filled 2020-02-28 (×5): qty 2

## 2020-02-28 MED ORDER — METRONIDAZOLE IN NACL 5-0.79 MG/ML-% IV SOLN
500.0000 mg | Freq: Once | INTRAVENOUS | Status: AC
  Administered 2020-02-28: 500 mg via INTRAVENOUS
  Filled 2020-02-28: qty 100

## 2020-02-28 MED ORDER — HYDROMORPHONE HCL 1 MG/ML IJ SOLN
0.5000 mg | Freq: Once | INTRAMUSCULAR | Status: AC
  Administered 2020-02-28: 0.5 mg via INTRAVENOUS
  Filled 2020-02-28: qty 1

## 2020-02-28 MED ORDER — CIPROFLOXACIN IN D5W 400 MG/200ML IV SOLN
400.0000 mg | Freq: Two times a day (BID) | INTRAVENOUS | Status: DC
  Administered 2020-02-29 – 2020-03-03 (×8): 400 mg via INTRAVENOUS
  Filled 2020-02-28 (×9): qty 200

## 2020-02-28 MED ORDER — HYDROMORPHONE HCL 1 MG/ML IJ SOLN
1.0000 mg | INTRAMUSCULAR | Status: DC | PRN
  Administered 2020-02-29 – 2020-03-02 (×12): 1 mg via INTRAVENOUS
  Filled 2020-02-28 (×12): qty 1

## 2020-02-28 MED ORDER — ACETAMINOPHEN 650 MG RE SUPP: 650.0000 mg | Freq: Four times a day (QID) | RECTAL | Status: DC | PRN

## 2020-02-28 MED ORDER — CIPROFLOXACIN IN D5W 400 MG/200ML IV SOLN
400.0000 mg | Freq: Once | INTRAVENOUS | Status: AC
  Administered 2020-02-28: 400 mg via INTRAVENOUS
  Filled 2020-02-28: qty 200

## 2020-02-28 MED ORDER — HYDROMORPHONE HCL 1 MG/ML IJ SOLN
1.0000 mg | Freq: Once | INTRAMUSCULAR | Status: AC
  Administered 2020-02-28: 1 mg via INTRAVENOUS
  Filled 2020-02-28: qty 1

## 2020-02-28 MED ORDER — ONDANSETRON HCL 4 MG/2ML IJ SOLN
4.0000 mg | Freq: Once | INTRAMUSCULAR | Status: AC
  Administered 2020-02-28: 4 mg via INTRAVENOUS
  Filled 2020-02-28: qty 2

## 2020-02-28 MED ORDER — METRONIDAZOLE IN NACL 5-0.79 MG/ML-% IV SOLN
500.0000 mg | Freq: Two times a day (BID) | INTRAVENOUS | Status: DC
  Administered 2020-02-29 – 2020-03-04 (×8): 500 mg via INTRAVENOUS
  Filled 2020-02-28 (×8): qty 100

## 2020-02-28 MED ORDER — ONDANSETRON HCL 4 MG PO TABS
4.0000 mg | ORAL_TABLET | Freq: Four times a day (QID) | ORAL | Status: DC | PRN
  Administered 2020-02-29: 4 mg via ORAL
  Filled 2020-02-28: qty 1

## 2020-02-28 MED ORDER — GABAPENTIN 100 MG PO CAPS
200.0000 mg | ORAL_CAPSULE | Freq: Three times a day (TID) | ORAL | Status: DC
  Administered 2020-02-28 – 2020-03-03 (×13): 200 mg via ORAL
  Filled 2020-02-28 (×13): qty 2

## 2020-02-28 MED ORDER — FENTANYL CITRATE (PF) 100 MCG/2ML IJ SOLN
25.0000 ug | Freq: Once | INTRAMUSCULAR | Status: AC
  Administered 2020-02-28: 25 ug via INTRAVENOUS
  Filled 2020-02-28: qty 2

## 2020-02-28 MED ORDER — ENOXAPARIN SODIUM 40 MG/0.4ML ~~LOC~~ SOLN
40.0000 mg | SUBCUTANEOUS | Status: DC
  Administered 2020-02-29 – 2020-03-03 (×4): 40 mg via SUBCUTANEOUS
  Filled 2020-02-28 (×4): qty 0.4

## 2020-02-28 MED ORDER — POTASSIUM CHLORIDE IN NACL 20-0.45 MEQ/L-% IV SOLN
INTRAVENOUS | Status: DC
  Filled 2020-02-28 (×3): qty 1000

## 2020-02-28 MED ORDER — POLYETHYLENE GLYCOL 3350 17 G PO PACK: 17.0000 g | PACK | Freq: Every day | ORAL | Status: DC | PRN

## 2020-02-28 NOTE — ED Notes (Signed)
Previous triage note written by this RN

## 2020-02-28 NOTE — ED Provider Notes (Signed)
[facility] EMERGENCY DEPARTMENT Provider Note   CSN: [id number] Arrival date & time: 02/28/20  1451     History No chief complaint on file.   [name] is a 64 y.o. female.  HPI    Patient with multiple medical issues including history of prior abdominal surgery, pancreatitis, diverticulitis, now presents with left-sided abdominal pain. Pain began about 4 days ago.  Since about the time it has been persistent, with associated anorexia, nausea.  Pain is focally in the left side, radiating from the flank towards the inguinal area.  Pain is worse with bowel movements, and not improved with anything in particular. No associated fever, no chest pain, no dyspnea.  Patient has a primary care appointment scheduled in 2 days, but due to persistency of pain she presents for evaluation. Past Medical History:  Diagnosis Date  . Alcohol dependence (HCC)   . Anxiety   . Chronic pain   . Depression   . Diverticulitis   . Herniated cervical disc   . Pancreatitis   . Seizures (HCC)    alcoholic seizures    Patient Active Problem List   Diagnosis Date Noted  . Acute diverticulitis 02/28/2020  . GERD (gastroesophageal reflux disease) 02/28/2020  . Suicidal ideation   . Delirium tremens (HCC) 12/13/2018  . Thrombocytopenia (HCC) 12/13/2018  . Homeless 12/13/2018  . Depression 12/13/2018  . Neuropathy 12/13/2018  . Subarachnoid hemorrhage (HCC) 12/11/2018  . Seizure due to alcohol withdrawal (HCC) 12/11/2018  . Alcohol dependence with alcohol-induced mood disorder (HCC)   . 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 Major depressive disorder, 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    Past Surgical History:  Procedure Laterality Date  . ABDOMINAL HYSTERECTOMY    . ANKLE SURGERY Right   . CARPAL TUNNEL RELEASE Bilateral   . CERVICAL FUSION    . CHOLECYSTECTOMY    . KNEE SURGERY       OB  History   No obstetric history on file.     History reviewed. No pertinent family history.  Social History   Tobacco Use  . Smoking status: Current Every Day Smoker    Packs/day: 0.50    Years: 7.00    Pack years: 3.50    Types: Cigarettes  . Smokeless tobacco: Never Used  Substance Use Topics  . Alcohol use: Yes    Comment: drinks a fifth of vodka daily  . Drug use: Not Currently    Types: Marijuana    Home Medications Prior to Admission medications   Medication Sig Start Date End Date Taking? Authorizing Provider  gabapentin (NEURONTIN) 100 MG capsule Take 2 capsules (200 mg total) by mouth 3 (three) times daily for 14 days. 08/04/19 02/28/20 Yes Lorelee New, PA-C  gabapentin (NEURONTIN) 800 MG tablet Take 800 mg by mouth 3 (three) times daily.   Yes [provider]  naproxen sodium (ALEVE) 220 MG tablet Take 220 mg by mouth daily as needed (For pain).   Yes [provider]  pantoprazole (PROTONIX) 40 MG tablet Take 1 tablet (40 mg total) by mouth daily. 08/04/19  Yes Lorelee New, PA-C  levETIRAcetam (KEPPRA) 500 MG tablet Take 1 tablet (500 mg total) by mouth 2 (two) times daily for 30 days. Patient not taking: Reported on 02/28/2020 12/27/18 01/26/19  Money, [name], FNP  metoCLOPramide (REGLAN) 10 MG tablet Take 1 tablet (10 mg total) by mouth every 6 (six) hours as needed for  nausea (nausea/headache). 04/06/19 05/24/19  Mesner, Barbara Cower, MD    Allergies    Cephalexin, Ketorolac, Prednisone, and Tramadol  Review of Systems   Review of Systems  Constitutional:       Per HPI, otherwise negative  HENT:       Per HPI, otherwise negative  Respiratory:       Per HPI, otherwise negative  Cardiovascular:       Per HPI, otherwise negative  Gastrointestinal: Positive for abdominal pain and nausea. Negative for vomiting.  Endocrine:       Negative aside from HPI  Genitourinary:       Neg aside from HPI   Musculoskeletal:       Per HPI, otherwise  negative  Skin: Negative.   Neurological: Negative for syncope.    Physical Exam Updated Vital Signs BP 128/82   Pulse 66   Temp 98.2 F (36.8 C) (Oral)   Resp 16   SpO2 97%   Physical Exam Vitals and nursing note reviewed.  Constitutional:      General: She is not in acute distress.    Appearance: She is well-developed.  HENT:     Head: Normocephalic and atraumatic.  Eyes:     Conjunctiva/sclera: Conjunctivae normal.  Cardiovascular:     Rate and Rhythm: Normal rate and regular rhythm.  Pulmonary:     Effort: Pulmonary effort is normal. No respiratory distress.     Breath sounds: Normal breath sounds. No stridor.  Abdominal:     General: There is no distension.     Tenderness: There is abdominal tenderness in the periumbilical area, left upper quadrant and left lower quadrant.  Skin:    General: Skin is warm and dry.  Neurological:     Mental Status: She is alert and oriented to person, place, and time.     Cranial Nerves: No cranial nerve deficit.      ED Results / Procedures / Treatments   Labs (all labs ordered are listed, but only abnormal results are displayed) Labs Reviewed  COMPREHENSIVE METABOLIC PANEL - Abnormal; Notable for the following components:      Result Value   BUN 6 (*)    All other components within normal limits  CBC - Abnormal; Notable for the following components:   MCV 101.2 (*)    All other components within normal limits  URINALYSIS, ROUTINE W REFLEX MICROSCOPIC - Abnormal; Notable for the following components:   Specific Gravity, Urine 1.003 (*)    All other components within normal limits  SARS CORONAVIRUS 2 BY RT PCR (HOSPITAL ORDER, PERFORMED IN [facility])  LIPASE, BLOOD  BASIC METABOLIC PANEL  CBC  TROPONIN I (HIGH SENSITIVITY)  TROPONIN I (HIGH SENSITIVITY)    EKG EKG Interpretation  Date/Time:  Wednesday Feb 28 2020 15:27:34 EDT Ventricular Rate:  70 PR Interval:  146 QRS Duration: 80 QT  Interval:  392 QTC Calculation: 423 R Axis:   -42 Text Interpretation: Normal sinus rhythm Left axis deviation Baseline wander Artifact Abnormal ECG Confirmed by Gerhard Munch [phone]) on 02/28/2020 5:59:44 PM   Radiology DG Chest 2 View  Result Date: 02/28/2020 CLINICAL DATA:  Chest pain. EXAM: CHEST - 2 VIEW COMPARISON:  August 04, 2019 FINDINGS: There is no evidence of acute infiltrate, pleural effusion or pneumothorax. The heart size and mediastinal contours are within normal limits. A radiopaque fusion plate and screws are seen overlying the lower cervical spine. The visualized skeletal structures are unremarkable. Radiopaque surgical clips are  seen overlying the right upper quadrant. IMPRESSION: No active cardiopulmonary disease. Electronically Signed   By: Aram Candela M.D.   On: 02/28/2020 16:34   CT Abdomen Pelvis W Contrast  Result Date: 02/28/2020 CLINICAL DATA:  64 year old female with history of abdominal pain and dysuria. Suspected abdominal infection or abscess. EXAM: CT ABDOMEN AND PELVIS WITH CONTRAST TECHNIQUE: Multidetector CT imaging of the abdomen and pelvis was performed using the standard protocol following bolus administration of intravenous contrast. CONTRAST:  OMNIPAQUE IOHEXOL 300 MG/ML  SOLN COMPARISON:  CT the abdomen and pelvis 09/26/2018. FINDINGS: Lower chest: Unremarkable. Hepatobiliary: Diffuse low attenuation throughout the hepatic parenchyma, indicative of hepatic steatosis. No suspicious cystic or solid hepatic lesions. No intra or extrahepatic biliary ductal dilatation. Status post cholecystectomy. Pancreas: No pancreatic mass. No pancreatic ductal dilatation. No pancreatic or peripancreatic fluid collections or inflammatory changes. Spleen: Unremarkable. Adrenals/Urinary Tract: Subcentimeter low-attenuation lesion in the upper pole the left kidney, too small to characterize, statistically likely to represent a tiny cyst. Right kidney and bilateral  adrenal glands are normal in appearance. No hydroureteronephrosis. Urinary bladder is normal in appearance. Stomach/Bowel: Normal appearance of the stomach. No pathologic dilatation of small bowel or colon. Numerous colonic diverticulae are noted, particularly in the sigmoid colon where there are some subtle surrounding inflammatory changes in the sigmoid mesocolon, concerning for acute diverticulitis. No discrete diverticular abscess or signs of frank perforation are noted at this time. Normal appendix. Vascular/Lymphatic: Aortic atherosclerosis, without evidence of aneurysm or dissection in the abdominal or pelvic vasculature. No lymphadenopathy noted in the abdomen or pelvis. Reproductive: Status post hysterectomy. Ovaries are not confidently identified may be surgically absent or atrophic. Other: No significant volume of ascites.  No pneumoperitoneum. Musculoskeletal: There are no aggressive appearing lytic or blastic lesions noted in the visualized portions of the skeleton. IMPRESSION: 1. Findings are concerning for very early acute diverticulitis. No diverticular abscess or signs of frank perforation at this time. 2. Hepatic steatosis. 3. Aortic atherosclerosis. Electronically Signed   By: Trudie Reed M.D.   On: 02/28/2020 20:24    Procedures Procedures (including critical care time)  Medications Ordered in ED Medications  ciprofloxacin (CIPRO) IVPB 400 mg (has no administration in time range)  metroNIDAZOLE (FLAGYL) IVPB 500 mg (has no administration in time range)  HYDROmorphone (DILAUDID) injection 1 mg (has no administration in time range)  0.45 % NaCl with KCl 20 mEq / L infusion (has no administration in time range)  pantoprazole (PROTONIX) EC tablet 40 mg (has no administration in time range)  gabapentin (NEURONTIN) capsule 200 mg (has no administration in time range)  enoxaparin (LOVENOX) injection 40 mg (has no administration in time range)  acetaminophen (TYLENOL) tablet 650 mg  (has no administration in time range)    Or  acetaminophen (TYLENOL) suppository 650 mg (has no administration in time range)  HYDROcodone-acetaminophen (NORCO/VICODIN) 5-325 MG per tablet 1-2 tablet (has no administration in time range)  polyethylene glycol (MIRALAX / GLYCOLAX) packet 17 g (has no administration in time range)  ondansetron (ZOFRAN) tablet 4 mg (has no administration in time range)    Or  ondansetron (ZOFRAN) injection 4 mg (has no administration in time range)  albuterol (PROVENTIL) (2.5 MG/3ML) 0.083% nebulizer solution 2.5 mg (has no administration in time range)  sodium chloride flush (NS) 0.9 % injection 3 mL (3 mLs Intravenous Given 02/28/20 1859)  sodium chloride 0.9 % bolus 500 mL (0 mLs Intravenous Stopped 02/28/20 2140)  ondansetron (ZOFRAN) injection 4 mg (4 mg  Intravenous Given 02/28/20 1900)  fentaNYL (SUBLIMAZE) injection 25 mcg (25 mcg Intravenous Given 02/28/20 1900)  HYDROmorphone (DILAUDID) injection 0.5 mg (0.5 mg Intravenous Given 02/28/20 2048)  iohexol (OMNIPAQUE) 300 MG/ML solution 100 mL (100 mLs Intravenous Contrast Given 02/28/20 2008)  ciprofloxacin (CIPRO) IVPB 400 mg (0 mg Intravenous Stopped 02/28/20 2236)  metroNIDAZOLE (FLAGYL) IVPB 500 mg (0 mg Intravenous Stopped 02/28/20 2236)  HYDROmorphone (DILAUDID) injection 1 mg (1 mg Intravenous Given 02/28/20 2253)    ED Course  I have reviewed the triage vital signs and the nursing notes.  Pertinent labs & imaging results that were available during my care of the patient were reviewed by me and considered in my medical decision making (see chart for details).  Clinical Course as of Feb 28 2344  Wed Feb 28, 2020  1726 Appearance: CLEAR [BS]    Clinical Course User Index [BS] Gabrielle Dare, [place]   MDM Rules/Calculators/A&P                      11:45 PM Patient continues to complain of substantial abdominal pain. We have reviewed findings again, including CT, labs. Patient has received  initial IV antibiotic, ciprofloxacin, Flagyl, and multiple doses of analgesia. Given persistent pain, though the patient's vital signs have improved since arrival, she will require admission for pain control, ongoing therapy.  MDM Number of Diagnoses or Management Options Acute diverticulitis: new, needed workup   Amount and/or Complexity of Data Reviewed Clinical lab tests: reviewed Tests in the radiology section of CPT: reviewed Tests in the medicine section of CPT: reviewed Decide to obtain previous medical records or to obtain history from someone other than the patient: yes Review and summarize past medical records: yes Discuss the patient with other providers: yes Independent visualization of images, tracings, or specimens: yes  Risk of Complications, Morbidity, and/or Mortality Presenting problems: high Diagnostic procedures: high Management options: high   Final Clinical Impression(s) / ED Diagnoses Final diagnoses:  Acute diverticulitis     Carmin Muskrat, MD 02/28/20 2347

## 2020-02-28 NOTE — ED Triage Notes (Signed)
Pt arrives pov with reports of chest pain, abd pain, dysuria, pain when having bowel movements. Hx diverticulitis, pancreatitis. States her bladder feels "like its going to fall out". Reports back pain with 2 degenerative discs. Pt has an appointment on Friday but states she could not wait due to pain.

## 2020-02-28 NOTE — H&P (Signed)
History and Physical    Kalasia Crafton NID:[id number] DOB: Jan 04, 1956 DOA: 02/28/2020  PCP: Patient, No Pcp Per   Patient coming from:  Home  Chief Complaint:  Abdominal pain   HPI: Felisia Balcom is a 64 y.o. female with medical history significant of diverticulosis with prior history of acute diverticulitis, approximately 10 years ago, anxiety/depression, severe alcohol use disorder (ceased 2 months ago) with prior history of alcohol withdrawal seizures, delirium tremens who came to hospital for evaluation of abdominal pain.  Patient stated that over the past 5 days, she started to experience left lower quadrant abdominal pain, which she described as sharp, colicky, but 10/10 in intensity, radiating towards umbilicus, worse with bowel movements and urination.  Reports associated nausea but no vomiting.  Denies any fever chills, loss of breath or chest pain.  She appears to be very anxious from above-mentioned pain.  Due to discomfort, she decided to come to ED tonight for evaluation. In the ED she was febrile, overall stable hemodynamically.  No hypoxia or tachypnea noted.  CBC and chemistry within normal limits, without leukocytosis.  Negative troponin level. Urinalysis negative for infectious process CT abdomen and pelvis demonstrated acute sigmoid diverticulitis, in early stage.  Patient treated with IV Cipro and Flagyl, pain control and due to severity of her symptoms, she will be admitted to hospital for further evaluation and treatment   Review of Systems: Review of Systems  Constitutional: Negative for chills and fever.  HENT: Negative.   Eyes: Negative.   Respiratory: Negative for cough, shortness of breath and wheezing.   Cardiovascular: Negative for chest pain, palpitations and leg swelling.  Gastrointestinal: Positive for abdominal pain, nausea and vomiting.  Genitourinary: Negative.   Musculoskeletal: Negative.   Skin: Negative.   Neurological: Negative.     Endo/Heme/Allergies: Negative.   Psychiatric/Behavioral: Positive for depression.   Past Medical History:  Diagnosis Date  . Alcohol dependence (HCC)   . Anxiety   . Chronic pain   . Depression   . Diverticulitis   . Herniated cervical disc   . Pancreatitis   . Seizures (HCC)    alcoholic seizures    Past Surgical History:  Procedure Laterality Date  . ABDOMINAL HYSTERECTOMY    . ANKLE SURGERY Right   . CARPAL TUNNEL RELEASE Bilateral   . CERVICAL FUSION    . CHOLECYSTECTOMY    . KNEE SURGERY     Social History  reports that she has been smoking cigarettes. She has a 3.50 pack-year smoking history. She has never used smokeless tobacco. She reports current alcohol use. She reports previous drug use. Drug: Marijuana.  Allergies  Allergen Reactions  . Cephalexin Other (See Comments)    Unknown  . Ketorolac Hives  . Prednisone Swelling  . Tramadol Hives   History reviewed. No pertinent family history. Patient does not have immediate family, her parents are deceased.  Prior to Admission medications   Medication Sig Start Date End Date Taking? Authorizing Provider  gabapentin (NEURONTIN) 100 MG capsule Take 2 capsules (200 mg total) by mouth 3 (three) times daily for 14 days. 08/04/19 02/28/20 Yes Lorelee New, PA-C  gabapentin (NEURONTIN) 800 MG tablet Take 800 mg by mouth 3 (three) times daily.   Yes [provider]  naproxen sodium (ALEVE) 220 MG tablet Take 220 mg by mouth daily as needed (For pain).   Yes [provider]  pantoprazole (PROTONIX) 40 MG tablet Take 1 tablet (40 mg total) by mouth daily. 08/04/19  Yes Green,  Sharion Settler, PA-C  levETIRAcetam (KEPPRA) 500 MG tablet Take 1 tablet (500 mg total) by mouth 2 (two) times daily for 30 days. Patient not taking: Reported on 02/28/2020 12/27/18 01/26/19  Money, [name], FNP  metoCLOPramide (REGLAN) 10 MG tablet Take 1 tablet (10 mg total) by mouth every 6 (six) hours as needed for nausea  (nausea/headache). 04/06/19 05/24/19  Marily Memos, MD   Physical Exam: Vitals:   02/28/20 2100 02/28/20 2130 02/28/20 2200 02/28/20 2230  BP: (!) 147/86 (!) 148/108 (!) 152/90 128/82  Pulse: 66 66 81 66  Resp: 15 16 17 16   Temp:      TempSrc:      SpO2: 98% 100% 99% 97%    Constitutional: NAD, calm, appears uncomfortable due to pain Vitals:   02/28/20 2100 02/28/20 2130 02/28/20 2200 02/28/20 2230  BP: (!) 147/86 (!) 148/108 (!) 152/90 128/82  Pulse: 66 66 81 66  Resp: 15 16 17 16   Temp:      TempSrc:      SpO2: 98% 100% 99% 97%   Eyes: PERRL, lids and conjunctivae normal ENMT: Mucous membranes are moist. Posterior pharynx clear of any exudate or lesions.Normal dentition.  Neck: normal, supple, no masses, no thyromegaly Respiratory: clear to auscultation bilaterally, no wheezing, no crackles. Normal respiratory effort. No accessory muscle use.  Cardiovascular: Regular rate and rhythm, no murmurs / rubs / gallops. No extremity edema. 2+ pedal pulses. No carotid bruits.  Abdomen: Tenderness in left lower quadrant, no masses palpated. No hepatosplenomegaly. Bowel sounds positive.  Musculoskeletal: no clubbing / cyanosis. No joint deformity upper and lower extremities. Good ROM, no contractures. Normal muscle tone.  Skin: no rashes, lesions, ulcers. No induration Neurologic: CN 2-12 grossly intact. Sensation intact, DTR normal. Strength 5/5 in all 4.  Psychiatric: Normal judgment and insight. Alert and oriented x 3.  Appears to be anxious   Labs on Admission: I have personally reviewed following labs and imaging studies  CBC: Recent Labs  Lab 02/28/20 1557  WBC 6.4  HGB 13.5  HCT 42.2  MCV 101.2*  PLT 262    Basic Metabolic Panel: Recent Labs  Lab 02/28/20 1557  NA 139  K 3.8  CL 101  CO2 25  GLUCOSE 89  BUN 6*  CREATININE 0.71  CALCIUM 9.9    GFR: CrCl cannot be calculated (Unknown ideal weight.).  Liver Function Tests: Recent Labs  Lab 02/28/20 1557    AST 33  ALT 28  ALKPHOS 65  BILITOT 0.8  PROT 7.4  ALBUMIN 4.2    Urine analysis:    Component Value Date/Time   COLORURINE YELLOW 02/28/2020 1618   APPEARANCEUR CLEAR 02/28/2020 1618   LABSPEC 1.003 (L) 02/28/2020 1618   PHURINE 6.0 02/28/2020 1618   GLUCOSEU NEGATIVE 02/28/2020 1618   HGBUR NEGATIVE 02/28/2020 1618   BILIRUBINUR NEGATIVE 02/28/2020 1618   KETONESUR NEGATIVE 02/28/2020 1618   PROTEINUR NEGATIVE 02/28/2020 1618   UROBILINOGEN 0.2 07/14/2019 1451   NITRITE NEGATIVE 02/28/2020 1618   LEUKOCYTESUR NEGATIVE 02/28/2020 1618    Radiological Exams on Admission: DG Chest 2 View  Result Date: 02/28/2020 CLINICAL DATA:  Chest pain. EXAM: CHEST - 2 VIEW COMPARISON:  August 04, 2019 FINDINGS: There is no evidence of acute infiltrate, pleural effusion or pneumothorax. The heart size and mediastinal contours are within normal limits. A radiopaque fusion plate and screws are seen overlying the lower cervical spine. The visualized skeletal structures are unremarkable. Radiopaque surgical clips are seen overlying the right upper  quadrant. IMPRESSION: No active cardiopulmonary disease. Electronically Signed   By: Virgina Norfolk M.D.   On: 02/28/2020 16:34   CT Abdomen Pelvis W Contrast  Result Date: 02/28/2020 CLINICAL DATA:  64 year old female with history of abdominal pain and dysuria. Suspected abdominal infection or abscess. EXAM: CT ABDOMEN AND PELVIS WITH CONTRAST TECHNIQUE: Multidetector CT imaging of the abdomen and pelvis was performed using the standard protocol following bolus administration of intravenous contrast. CONTRAST:  14mL OMNIPAQUE IOHEXOL 300 MG/ML  SOLN COMPARISON:  CT the abdomen and pelvis 09/26/2018. FINDINGS: Lower chest: Unremarkable. Hepatobiliary: Diffuse low attenuation throughout the hepatic parenchyma, indicative of hepatic steatosis. No suspicious cystic or solid hepatic lesions. No intra or extrahepatic biliary ductal dilatation. Status post  cholecystectomy. Pancreas: No pancreatic mass. No pancreatic ductal dilatation. No pancreatic or peripancreatic fluid collections or inflammatory changes. Spleen: Unremarkable. Adrenals/Urinary Tract: Subcentimeter low-attenuation lesion in the upper pole the left kidney, too small to characterize, statistically likely to represent a tiny cyst. Right kidney and bilateral adrenal glands are normal in appearance. No hydroureteronephrosis. Urinary bladder is normal in appearance. Stomach/Bowel: Normal appearance of the stomach. No pathologic dilatation of small bowel or colon. Numerous colonic diverticulae are noted, particularly in the sigmoid colon where there are some subtle surrounding inflammatory changes in the sigmoid mesocolon, concerning for acute diverticulitis. No discrete diverticular abscess or signs of frank perforation are noted at this time. Normal appendix. Vascular/Lymphatic: Aortic atherosclerosis, without evidence of aneurysm or dissection in the abdominal or pelvic vasculature. No lymphadenopathy noted in the abdomen or pelvis. Reproductive: Status post hysterectomy. Ovaries are not confidently identified may be surgically absent or atrophic. Other: No significant volume of ascites.  No pneumoperitoneum. Musculoskeletal: There are no aggressive appearing lytic or blastic lesions noted in the visualized portions of the skeleton. IMPRESSION: 1. Findings are concerning for very early acute diverticulitis. No diverticular abscess or signs of frank perforation at this time. 2. Hepatic steatosis. 3. Aortic atherosclerosis. Electronically Signed   By: Vinnie Langton M.D.   On: 02/28/2020 20:24   EKG: Independently reviewed.   Assessment/Plan  Principal Problem:   Acute diverticulitis Active Problems:   Major depressive disorder, 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 psychotic features ([place])   Alcohol dependence with alcohol-induced mood disorder (HCC)   Neuropathy   GERD (gastroesophageal reflux  disease)   Acute sigmoid diverticulitis Due to severe pain, patient would require admission under observation for mostly pain control. Continue IV antibiotics, Cipro Flagyl.  IV Dilaudid for pain control, as well IV fluids. Clear liquid diet and advance as tolerated  Anxiety/depression Patient appears to be very anxious, although does not take any chronic medication.  History of severe alcohol use disorder History of prior delirium tremens and alcohol withdrawal seizures Patient stated that she has been sober for 2 months.   Currently does not have symptoms of alcohol withdrawal  Peripheral neuropathy      Continue gabapentin Gastroesophageal reflux disease       Continue pantoprazole   DVT prophylaxis: Lovenox  Code Status:        Full code Family Communication:  No immediate family  Disposition Plan:   Patient is from:  Home   Anticipated DC to:  Home  Anticipated DC date:  03/01/2020  Anticipated DC barriers: None Consults called:  None Admission status:  Observation   Severity of Illness: The appropriate patient status for this patient is OBSERVATION. Observation status is judged to be reasonable and necessary in order to provide the required intensity of service  to ensure the patient's safety. The patient's presenting symptoms, physical exam findings, and initial radiographic and laboratory data in the context of their medical condition is felt to place them at decreased risk for further clinical deterioration. Furthermore, it is anticipated that the patient will be medically stable for discharge from the hospital within 2 midnights of admission. The following factors support the patient status of observation.   " The patient's presenting symptoms include: Abdominal pain, nausea " The physical exam findings include: Severe left lower quad abdominal pain " The initial radiographic and laboratory data are : Mild acute diverticulitis on CT  Youlanda Roys MD [facility]  How to contact the [facility] Attending or Consulting provider 7A - 7P or covering provider during after hours 7P -7A, for this patient?   1. Check the care team in [facility] and look for a) attending/consulting [facility] provider listed and b) the [facility] team listed 2. Log into [email or web address] and use [facility]'s universal password to access. If you do not have the password, please contact the hospital operator. 3. Locate the [facility] provider you are looking for under [facility] and page to a number that you can be directly reached. 4. If you still have difficulty reaching the provider, please page the [facility] (Director on Call) for the Hospitalists listed on amion for assistance.  02/28/2020, 11:24 PM

## 2020-02-29 ENCOUNTER — Other Ambulatory Visit: Payer: Self-pay

## 2020-02-29 ENCOUNTER — Encounter (HOSPITAL_COMMUNITY): Payer: Self-pay | Admitting: Internal Medicine

## 2020-02-29 DIAGNOSIS — K5792 Diverticulitis of intestine, part unspecified, without perforation or abscess without bleeding: Secondary | ICD-10-CM | POA: Diagnosis present

## 2020-02-29 DIAGNOSIS — F1021 Alcohol dependence, in remission: Secondary | ICD-10-CM | POA: Diagnosis present

## 2020-02-29 DIAGNOSIS — G629 Polyneuropathy, unspecified: Secondary | ICD-10-CM | POA: Diagnosis present

## 2020-02-29 DIAGNOSIS — Z888 Allergy status to other drugs, medicaments and biological substances status: Secondary | ICD-10-CM | POA: Diagnosis not present

## 2020-02-29 DIAGNOSIS — F332 Major depressive disorder, 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 psychotic features: Secondary | ICD-10-CM | POA: Diagnosis present

## 2020-02-29 DIAGNOSIS — K219 Gastro-esophageal reflux disease without esophagitis: Secondary | ICD-10-CM | POA: Diagnosis present

## 2020-02-29 DIAGNOSIS — G8929 Other chronic pain: Secondary | ICD-10-CM | POA: Diagnosis present

## 2020-02-29 DIAGNOSIS — R630 Anorexia: Secondary | ICD-10-CM | POA: Diagnosis present

## 2020-02-29 DIAGNOSIS — F17210 Nicotine dependence, cigarettes, uncomplicated: Secondary | ICD-10-CM | POA: Diagnosis present

## 2020-02-29 DIAGNOSIS — F1024 Alcohol dependence with alcohol-induced mood disorder: Secondary | ICD-10-CM | POA: Diagnosis present

## 2020-02-29 DIAGNOSIS — Z79899 Other long term (current) drug therapy: Secondary | ICD-10-CM | POA: Diagnosis not present

## 2020-02-29 DIAGNOSIS — K5732 Diverticulitis of large intestine without perforation or abscess without bleeding: Secondary | ICD-10-CM | POA: Diagnosis present

## 2020-02-29 DIAGNOSIS — R110 Nausea: Secondary | ICD-10-CM | POA: Diagnosis present

## 2020-02-29 DIAGNOSIS — F419 Anxiety disorder, unspecified: Secondary | ICD-10-CM | POA: Diagnosis present

## 2020-02-29 DIAGNOSIS — G4089 Other seizures: Secondary | ICD-10-CM | POA: Diagnosis present

## 2020-02-29 DIAGNOSIS — Z20822 Contact with and (suspected) exposure to covid-19: Secondary | ICD-10-CM | POA: Diagnosis present

## 2020-02-29 LAB — CBC
HCT: 43.9 % (ref 36.0–46.0)
Hemoglobin: 14 g/dL (ref 12.0–15.0)
MCH: 32.6 pg (ref 26.0–34.0)
MCHC: 31.9 g/dL (ref 30.0–36.0)
MCV: 102.1 fL — ABNORMAL HIGH (ref 80.0–100.0)
Platelets: 264 10*3/uL (ref 150–400)
RBC: 4.3 MIL/uL (ref 3.87–5.11)
RDW: 14.7 % (ref 11.5–15.5)
WBC: 4.5 10*3/uL (ref 4.0–10.5)
nRBC: 0 % (ref 0.0–0.2)

## 2020-02-29 LAB — BASIC METABOLIC PANEL
Anion gap: 13 (ref 5–15)
BUN: 6 mg/dL — ABNORMAL LOW (ref 8–23)
CO2: 21 mmol/L — ABNORMAL LOW (ref 22–32)
Calcium: 9.4 mg/dL (ref 8.9–10.3)
Chloride: 105 mmol/L (ref 98–111)
Creatinine, Ser: 0.67 mg/dL (ref 0.44–1.00)
GFR calc Af Amer: >60 mL/min (ref >60)
GFR calc non Af Amer: >60 mL/min (ref >60)
Glucose, Bld: 98 mg/dL (ref 70–99)
Potassium: 3.7 mmol/L (ref 3.5–5.1)
Sodium: 139 mmol/L (ref 135–145)

## 2020-02-29 LAB — SARS CORONAVIRUS 2 BY RT PCR (HOSPITAL ORDER, PERFORMED IN ~~LOC~~ HOSPITAL LAB): SARS Coronavirus 2: NEGATIVE

## 2020-02-29 MED ORDER — LACTATED RINGERS IV SOLN: INTRAVENOUS | Status: DC

## 2020-02-29 NOTE — Progress Notes (Signed)
Pt arrived to 4e10 from ED. Pt ambulated to the bed independently. CHG bath given. VSS. Pt c/o of left sided abd pain. Pt was educated to the unit and room. Pt's belongings in the closet. Call light within reach. Will continue to monitor.   Judithann Sheen, RN

## 2020-02-29 NOTE — TOC Initial Note (Signed)
Transition of Care [facility]) - Initial/Assessment Note    Patient Details  Name: [name] MRN: [id number] Date of Birth: 05-21-1956  [facility] of [facility]) CM/SW Contact:    [name], [place] Phone Number: 02/29/2020, 5:16 PM  Clinical Narrative:                  CSW visit with patient at bedside. CSW introduced self and explained reason for consult. Patient states lives with  her significant other, [name]  at [employer] in [place]. She receives SSI and he receives disability. Patient explained, at one point she was the only one with income and paying Extended Stay what she could until [name] started getting disability. She states now the owe over 7,000.00 in rent to the hotel and  they can not afford to pay. She states they have court on Monday and then 30 days to leave. Patient states she has contacted [facility]. Patient states she does not want to stay in shelter. CSW encourage patient to seek assistance from the [employer]) and [employer]. CSW explained housing is a major issue and affordable housing usually has long waiting list. CSW encourage to consider other extended stay lodges in the area or in surrounding cities. Patient was advise she can use [place] or  the [facility] as their permeant address while in transition (but must have picture ID). Patient states she understands. Patient inquired about apartments and studios for rent, CSW explained, she can visit the [employer] and search afford housing, etc on the Internet. CSW advised she will be given some affordable housing resources tomorrow but most of them require applicant to apply online. Patient states she understands.  [name], MSW, LCSWA Clinical Social Worker     Barriers to Discharge: Continued Medical Work up   Patient Goals and CMS Choice        Expected Discharge Plan and Services         Living  arrangements for the past 2 months: Hotel/Motel                                      Prior Living Arrangements/Services Living arrangements for the past 2 months: Hotel/Motel Lives with:: Self, Significant Other                   Activities of Daily Living Home Assistive Devices/Equipment: Eyeglasses ADL Screening (condition at time of admission) Patient's cognitive ability adequate to safely complete daily activities?: Yes Is the patient deaf or have difficulty hearing?: No Does the patient have difficulty seeing, even when wearing glasses/contacts?: No Does the patient have difficulty concentrating, remembering, or making decisions?: No Patient able to express need for assistance with ADLs?: Yes Does the patient have difficulty dressing or bathing?: No Independently performs ADLs?: Yes (appropriate for developmental age) Does the patient have difficulty walking or climbing stairs?: Yes Weakness of Legs: Both Weakness of Arms/Hands: None  Permission Sought/Granted                  Emotional Assessment Appearance:: Appears stated age Attitude/Demeanor/Rapport: Engaged Affect (typically observed): Appropriate, Pleasant Orientation: : Oriented to Self, Oriented to Place, Oriented to  Time, Oriented to Situation Alcohol / Substance Use: Alcohol Use Psych Involvement: No (comment)  Admission diagnosis:  Acute diverticulitis [K57.92] Diverticulitis [K57.92] Patient Active Problem List   Diagnosis  Date Noted  . Diverticulitis 02/29/2020  . Acute diverticulitis 02/28/2020  . GERD (gastroesophageal reflux disease) 02/28/2020  . Suicidal ideation   . Delirium tremens (HCC) 12/13/2018  . Thrombocytopenia (HCC) 12/13/2018  . Homeless 12/13/2018  . Depression 12/13/2018  . Neuropathy 12/13/2018  . Subarachnoid hemorrhage (HCC) 12/11/2018  . Seizure due to alcohol withdrawal (HCC) 12/11/2018  . Alcohol dependence with alcohol-induced mood disorder (HCC)   . MDD  (major depressive disorder), recurrent severe, without psychosis (HCC) 10/12/2018  . Major depressive disorder, 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   PCP:  Patient, No Pcp Per Pharmacy:   [id number] - [place], [place] - [address] [place] [address] Phone: [phone] Fax: [phone]     Social Determinants of Health (SDOH) Interventions    Readmission Risk Interventions No flowsheet data found.

## 2020-02-29 NOTE — Progress Notes (Signed)
PROGRESS NOTE    [name]    Code Status: Full Code  JKD:[id number] DOB: [birth year] DOA: 02/28/2020 LOS: 0 days  PCP: Patient, No Pcp Per CC: No chief complaint on file.      Hospital Summary   This is a 64 year old female with history of diverticulosis, anxiety/depression, alcohol abuse currently in remission and history of alcohol withdrawal seizures who came to the hospital for evaluation of abdominal pain and was admitted on 5/12 for sigmoid diverticulitis without abscess on imaging and was started on Cipro and Flagyl and clear liquid diet.  Never had a colonoscopy before.  Continue to have abdominal pain and was changed to n.p.o. on 5/13.    A & P   Principal Problem:   Acute diverticulitis Active Problems:   Major depressive disorder, 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 psychotic features (HCC)   Alcohol dependence with alcohol-induced mood disorder (HCC)   Neuropathy   GERD (gastroesophageal reflux disease)   Diverticulitis   1. Acute sigmoid diverticulitis without abscess a. Continues to require frequent IV Dilaudid for pain control b. No leukocytosis or fevers c. Not tolerating clear liquid diet d. Continue Cipro/Flagyl e. De-escalate diet to n.p.o. except for sips and ice chips and hopefully can advance tomorrow f. will change half-normal saline with KCl to lactated Ringer's g. She will need a colonoscopy in 6 to 8 weeks post hospitalization  2. Anxiety/depression a. Currently stable off medications  3. Peripheral neuropathy on gabapentin  4. GERD on pantoprazole  5. History of severe alcohol abuse with delirium tremens and alcohol withdrawal seizures, currently in remission a. Reportedly last drink 2 months ago b. No signs of alcohol withdrawal  DVT prophylaxis: Lovenox Family Communication: Patient updated at bedside Disposition Plan:  Status is: Inpatient  Remains inpatient appropriate because:IV treatments appropriate due to intensity of illness or  inability to take PO   Dispo: The patient is from: Home              Anticipated d/c is to: Home              Anticipated d/c date is: 2 days              Patient currently is not medically stable to d/c.           Pressure injury documentation    None  Consultants  None  Procedures  None  Antibiotics   Anti-infectives (From admission, onward)   Start     Dose/Rate Route Frequency Ordered Stop   02/29/20 1000  ciprofloxacin (CIPRO) IVPB 400 mg     400 mg 200 mL/hr over 60 Minutes Intravenous Every 12 hours 02/28/20 2305 03/07/20 0959   02/29/20 1000  metroNIDAZOLE (FLAGYL) IVPB 500 mg     500 mg 100 mL/hr over 60 Minutes Intravenous Every 12 hours 02/28/20 2305 03/07/20 0959   02/28/20 2130  ciprofloxacin (CIPRO) IVPB 400 mg     400 mg 200 mL/hr over 60 Minutes Intravenous  Once 02/28/20 2122 02/28/20 2236   02/28/20 2130  metroNIDAZOLE (FLAGYL) IVPB 500 mg     500 mg 100 mL/hr over 60 Minutes Intravenous  Once 02/28/20 2122 02/28/20 2236        Subjective   States that she is having significant left lower quadrant pain requiring frequent IV Dilaudid with minimal improvement from admission and poor p.o. intake.  Denies fevers, nausea or vomiting.  Has not had a bowel movement since yesterday morning.  No issues overnight and no other  complaints. Objective   Vitals:   02/29/20 0328 02/29/20 0407 02/29/20 0822 02/29/20 1124  BP:  119/82 113/80 (!) 157/86  Pulse:  72 72 71  Resp: 16 19 18 20   Temp:  (!) 97.5 F (36.4 C) (!) 97.5 F (36.4 C) 97.6 F (36.4 C)  TempSrc:  Oral Oral Oral  SpO2: 96% 96% 92% 100%  Weight:      Height:        Intake/Output Summary (Last 24 hours) at 02/29/2020 1654 Last data filed at 02/29/2020 1517 Gross per 24 hour  Intake 2284.65 ml  Output --  Net 2284.65 ml   Filed Weights   02/29/20 0228  Weight: 61.7 kg    Examination:  Physical Exam Vitals and nursing note reviewed.  Constitutional:      Appearance:  Normal appearance.  HENT:     Head: Normocephalic and atraumatic.  Eyes:     Conjunctiva/sclera: Conjunctivae normal.  Cardiovascular:     Rate and Rhythm: Normal rate and regular rhythm.  Pulmonary:     Effort: Pulmonary effort is normal.     Breath sounds: Normal breath sounds.  Abdominal:     Tenderness: There is abdominal tenderness.     Comments: LLQ tenderness  Musculoskeletal:        General: No swelling or tenderness.  Skin:    Coloration: Skin is not jaundiced or pale.  Neurological:     Mental Status: She is alert. Mental status is at baseline.  Psychiatric:        Mood and Affect: Mood normal.        Behavior: Behavior normal.     Data Reviewed: I have personally reviewed following labs and imaging studies  CBC: Recent Labs  Lab 02/28/20 1557 02/29/20 0111  WBC 6.4 4.5  HGB 13.5 14.0  HCT 42.2 43.9  MCV 101.2* 102.1*  PLT 262 264   Basic Metabolic Panel: Recent Labs  Lab 02/28/20 1557 02/29/20 0111  NA 139 139  K 3.8 3.7  CL 101 105  CO2 25 21*  GLUCOSE 89 98  BUN 6* 6*  CREATININE 0.71 0.67  CALCIUM 9.9 9.4   GFR: Estimated Creatinine Clearance: 59.9 mL/min (by C-G formula based on SCr of 0.67 mg/dL). Liver Function Tests: Recent Labs  Lab 02/28/20 1557  AST 33  ALT 28  ALKPHOS 65  BILITOT 0.8  PROT 7.4  ALBUMIN 4.2   Recent Labs  Lab 02/28/20 1557  LIPASE 33   No results for input(s): AMMONIA in the last 168 hours. Coagulation Profile: No results for input(s): INR, PROTIME in the last 168 hours. Cardiac Enzymes: No results for input(s): CKTOTAL, CKMB, CKMBINDEX, TROPONINI in the last 168 hours. BNP (last 3 results) No results for input(s): PROBNP in the last 8760 hours. HbA1C: No results for input(s): HGBA1C in the last 72 hours. CBG: No results for input(s): GLUCAP in the last 168 hours. Lipid Profile: No results for input(s): CHOL, HDL, LDLCALC, TRIG, CHOLHDL, LDLDIRECT in the last 72 hours. Thyroid Function Tests: No  results for input(s): TSH, T4TOTAL, FREET4, T3FREE, THYROIDAB in the last 72 hours. Anemia Panel: No results for input(s): VITAMINB12, FOLATE, FERRITIN, TIBC, IRON, RETICCTPCT in the last 72 hours. Sepsis Labs: No results for input(s): PROCALCITON, LATICACIDVEN in the last 168 hours.  Recent Results (from the past 240 hour(s))  SARS Coronavirus 2 by RT PCR (hospital order, performed in [facility] hospital lab) Nasopharyngeal Nasopharyngeal Swab     Status: None   Collection  Time: 02/28/20 10:56 PM   Specimen: Nasopharyngeal Swab  Result Value Ref Range Status   SARS Coronavirus 2 NEGATIVE NEGATIVE Final    Comment: (NOTE) SARS-CoV-2 target nucleic acids are NOT DETECTED. The SARS-CoV-2 RNA is generally detectable in upper and lower respiratory specimens during the acute phase of infection. The lowest concentration of SARS-CoV-2 viral copies this assay can detect is 250 copies / mL. A negative result does not preclude SARS-CoV-2 infection and should not be used as the sole basis for treatment or other patient management decisions.  A negative result may occur with improper specimen collection / handling, submission of specimen other than nasopharyngeal swab, presence of viral mutation(s) within the areas targeted by this assay, and inadequate number of viral copies (<250 copies / mL). A negative result must be combined with clinical observations, patient history, and epidemiological information. Fact Sheet for Patients:   BoilerBrush.com.cy Fact Sheet for Healthcare Providers: https://pope.com/ This test is not yet approved or cleared  by the [place] FDA and has been authorized for detection and/or diagnosis of SARS-CoV-2 by FDA under an Emergency Use Authorization (EUA).  This EUA will remain in effect (meaning this test can be used) for the duration of the COVID-19 declaration under Section 564(b)(1) of the Act, 21  U.S.C. section 360bbb-3(b)(1), unless the authorization is terminated or revoked sooner. Performed at [facility], [address], [place], [place] [address]          Radiology Studies: DG Chest 2 View  Result Date: 02/28/2020 CLINICAL DATA:  Chest pain. EXAM: CHEST - 2 VIEW COMPARISON:  August 04, 2019 FINDINGS: There is no evidence of acute infiltrate, pleural effusion or pneumothorax. The heart size and mediastinal contours are within normal limits. A radiopaque fusion plate and screws are seen overlying the lower cervical spine. The visualized skeletal structures are unremarkable. Radiopaque surgical clips are seen overlying the right upper quadrant. IMPRESSION: No active cardiopulmonary disease. Electronically Signed   By: Aram Candela M.D.   On: 02/28/2020 16:34   CT Abdomen Pelvis W Contrast  Result Date: 02/28/2020 CLINICAL DATA:  64 year old female with history of abdominal pain and dysuria. Suspected abdominal infection or abscess. EXAM: CT ABDOMEN AND PELVIS WITH CONTRAST TECHNIQUE: Multidetector CT imaging of the abdomen and pelvis was performed using the standard protocol following bolus administration of intravenous contrast. CONTRAST:  OMNIPAQUE IOHEXOL 300 MG/ML  SOLN COMPARISON:  CT the abdomen and pelvis 09/26/2018. FINDINGS: Lower chest: Unremarkable. Hepatobiliary: Diffuse low attenuation throughout the hepatic parenchyma, indicative of hepatic steatosis. No suspicious cystic or solid hepatic lesions. No intra or extrahepatic biliary ductal dilatation. Status post cholecystectomy. Pancreas: No pancreatic mass. No pancreatic ductal dilatation. No pancreatic or peripancreatic fluid collections or inflammatory changes. Spleen: Unremarkable. Adrenals/Urinary Tract: Subcentimeter low-attenuation lesion in the upper pole the left kidney, too small to characterize, statistically likely to represent a tiny cyst. Right kidney and bilateral adrenal glands are normal in  appearance. No hydroureteronephrosis. Urinary bladder is normal in appearance. Stomach/Bowel: Normal appearance of the stomach. No pathologic dilatation of small bowel or colon. Numerous colonic diverticulae are noted, particularly in the sigmoid colon where there are some subtle surrounding inflammatory changes in the sigmoid mesocolon, concerning for acute diverticulitis. No discrete diverticular abscess or signs of frank perforation are noted at this time. Normal appendix. Vascular/Lymphatic: Aortic atherosclerosis, without evidence of aneurysm or dissection in the abdominal or pelvic vasculature. No lymphadenopathy noted in the abdomen or pelvis. Reproductive: Status post hysterectomy. Ovaries are  not confidently identified may be surgically absent or atrophic. Other: No significant volume of ascites.  No pneumoperitoneum. Musculoskeletal: There are no aggressive appearing lytic or blastic lesions noted in the visualized portions of the skeleton. IMPRESSION: 1. Findings are concerning for very early acute diverticulitis. No diverticular abscess or signs of frank perforation at this time. 2. Hepatic steatosis. 3. Aortic atherosclerosis. Electronically Signed   By: Vinnie Langton M.D.   On: 02/28/2020 20:24        Scheduled Meds: . enoxaparin (LOVENOX) injection  40 mg Subcutaneous Q24H  . gabapentin  200 mg Oral TID  . pantoprazole  40 mg Oral Daily   Continuous Infusions: . 0.45 % NaCl with KCl 20 mEq / L 125 mL/hr at 02/29/20 0847  . ciprofloxacin 400 mg (02/29/20 0843)  . metronidazole 500 mg (02/29/20 1122)     Time spent: 28 minutes with over 50% of the time coordinating the patient's care    Harold Hedge, DO [facility] Pager [phone]  Call night coverage person covering after 7pm

## 2020-03-01 LAB — CBC
HCT: 40.1 % (ref 36.0–46.0)
Hemoglobin: 12.3 g/dL (ref 12.0–15.0)
MCH: 31.9 pg (ref 26.0–34.0)
MCHC: 30.7 g/dL (ref 30.0–36.0)
MCV: 104.2 fL — ABNORMAL HIGH (ref 80.0–100.0)
Platelets: 220 10*3/uL (ref 150–400)
RBC: 3.85 MIL/uL — ABNORMAL LOW (ref 3.87–5.11)
RDW: 14.2 % (ref 11.5–15.5)
WBC: 4.4 10*3/uL (ref 4.0–10.5)
nRBC: 0 % (ref 0.0–0.2)

## 2020-03-01 LAB — BASIC METABOLIC PANEL
Anion gap: 8 (ref 5–15)
BUN: <5 mg/dL — ABNORMAL LOW (ref 8–23)
CO2: 25 mmol/L (ref 22–32)
Calcium: 9.3 mg/dL (ref 8.9–10.3)
Chloride: 112 mmol/L — ABNORMAL HIGH (ref 98–111)
Creatinine, Ser: 0.71 mg/dL (ref 0.44–1.00)
GFR calc Af Amer: >60 mL/min (ref >60)
GFR calc non Af Amer: >60 mL/min (ref >60)
Glucose, Bld: 88 mg/dL (ref 70–99)
Potassium: 5 mmol/L (ref 3.5–5.1)
Sodium: 145 mmol/L (ref 135–145)

## 2020-03-01 LAB — MAGNESIUM: Magnesium: 1.7 mg/dL (ref 1.7–2.4)

## 2020-03-01 NOTE — TOC Progression Note (Addendum)
Transition of Care [facility]) - Progression Note    Patient Details  Name: [name] MRN: [id number] Date of Birth: [birth year]  Transition [facility]) CM/SW [place], [place] Phone Number: 03/01/2020, 10:05 AM  Clinical Narrative:     CSW met with patient today and provided [place] programs, [place] [facility], and other Housing resources in [place]. Patient states no other questions or concerns art this time.  Clinical Social Worker will sign off for now as social work intervention is no longer needed. Please consult [place] if new need arises.    Thurmond Butts, LCSWA Clinical Social Worker [phone]      Barriers to Discharge: Continued Medical Work up  Expected Discharge Plan and Services         Living arrangements for the past 2 months: Hotel/Motel                                       Social Determinants of Health (SDOH) Interventions    Readmission Risk Interventions No flowsheet data found.

## 2020-03-01 NOTE — Progress Notes (Signed)
PROGRESS NOTE    [name]    Code Status: Full Code  XBD:[id number] DOB: 07/19/1956 DOA: 02/28/2020 LOS: 1 days  PCP: Patient, No Pcp Per CC: No chief complaint on file.      Hospital Summary   This is a 64 year old female with history of diverticulosis, anxiety/depression, alcohol abuse currently in remission and history of alcohol withdrawal seizures who came to the hospital for evaluation of abdominal pain and was admitted on 5/12 for sigmoid diverticulitis without abscess on imaging and was started on Cipro and Flagyl and clear liquid diet.  Never had a colonoscopy before.  Continue to have abdominal pain and was changed to n.p.o. on 5/13.    A & P   Principal Problem:   Acute diverticulitis Active Problems:   Major depressive disorder, 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 psychotic features (HCC)   Alcohol dependence with alcohol-induced mood disorder (HCC)   Neuropathy   GERD (gastroesophageal reflux disease)   Diverticulitis   1. Acute sigmoid diverticulitis without abscess a. Improved pain today and able to walk around a bit b. Continue Cipro/Flagyl c. Tolerated n.p.o., will slowly advance diet and start with clear liquid diet today.  Advance to full liquids tomorrow if pain improved d. She will need a colonoscopy in 6 to 8 weeks post hospitalization  2. Anxiety/depression a. Currently stable off medications  3. Peripheral neuropathy on gabapentin  4. GERD on pantoprazole  5. History of severe alcohol abuse with delirium tremens and alcohol withdrawal seizures, currently in remission a. Reportedly last drink 2 months ago b. No signs of alcohol withdrawal  DVT prophylaxis: Lovenox Family Communication: Patient updated at bedside Disposition Plan:  Status is: Inpatient  Remains inpatient appropriate because:IV treatments appropriate due to intensity of illness or inability to take PO   Dispo: The patient is from: Home              Anticipated d/c is to: Home           Anticipated d/c date is: 2 days              Patient currently is not medically stable to d/c.           Pressure injury documentation    None  Consultants  None  Procedures  None  Antibiotics   Anti-infectives (From admission, onward)   Start     Dose/Rate Route Frequency Ordered Stop   02/29/20 1000  ciprofloxacin (CIPRO) IVPB 400 mg     400 mg 200 mL/hr over 60 Minutes Intravenous Every 12 hours 02/28/20 2305 03/07/20 0959   02/29/20 1000  metroNIDAZOLE (FLAGYL) IVPB 500 mg     500 mg 100 mL/hr over 60 Minutes Intravenous Every 12 hours 02/28/20 2305 03/07/20 0959   02/28/20 2130  ciprofloxacin (CIPRO) IVPB 400 mg     400 mg 200 mL/hr over 60 Minutes Intravenous  Once 02/28/20 2122 02/28/20 2236   02/28/20 2130  metroNIDAZOLE (FLAGYL) IVPB 500 mg     500 mg 100 mL/hr over 60 Minutes Intravenous  Once 02/28/20 2122 02/28/20 2236        Subjective   Walking around the room states that her pain has improved but still present.  Had an episode of diarrhea yesterday but no recurrent issues.  She thinks she can tolerate clear liquid diet today.  No other issues. Objective   Vitals:   02/29/20 2342 03/01/20 0341 03/01/20 0408 03/01/20 0802  BP: 113/79 (!) 162/91 126/73 (!) 154/80  Pulse:  89 90 72  Resp: 16 16  16   Temp: 97.8 F (36.6 C) 98.2 F (36.8 C)  98.4 F (36.9 C)  TempSrc: Oral Oral  Oral  SpO2: 95% 99%  95%  Weight:  62.6 kg    Height:        Intake/Output Summary (Last 24 hours) at 03/01/2020 1743 Last data filed at 03/01/2020 0845 Gross per 24 hour  Intake 0 ml  Output --  Net 0 ml   Filed Weights   02/29/20 0228 03/01/20 0341  Weight: 61.7 kg 62.6 kg    Examination:  Physical Exam Vitals and nursing note reviewed.  Constitutional:      Appearance: Normal appearance.  HENT:     Head: Normocephalic and atraumatic.  Eyes:     Conjunctiva/sclera: Conjunctivae normal.  Cardiovascular:     Rate and Rhythm: Normal rate and  regular rhythm.  Pulmonary:     Effort: Pulmonary effort is normal.     Breath sounds: Normal breath sounds.  Abdominal:     General: Abdomen is flat.     Comments: LLQ mild tenderness to palpation  Musculoskeletal:        General: No swelling or tenderness.  Skin:    Coloration: Skin is not jaundiced or pale.  Neurological:     Mental Status: She is alert. Mental status is at baseline.  Psychiatric:        Mood and Affect: Mood normal.        Behavior: Behavior normal.     Data Reviewed: I have personally reviewed following labs and imaging studies  CBC: Recent Labs  Lab 02/28/20 1557 02/29/20 0111 03/01/20 0433  WBC 6.4 4.5 4.4  HGB 13.5 14.0 12.3  HCT 42.2 43.9 40.1  MCV 101.2* 102.1* 104.2*  PLT 262 264 220   Basic Metabolic Panel: Recent Labs  Lab 02/28/20 1557 02/29/20 0111 03/01/20 0433  NA 139 139 145  K 3.8 3.7 5.0  CL 101 105 112*  CO2 25 21* 25  GLUCOSE 89 98 88  BUN 6* 6* <5*  CREATININE 0.71 0.67 0.71  CALCIUM 9.9 9.4 9.3  MG  --   --  1.7   GFR: Estimated Creatinine Clearance: 60.2 mL/min (by C-G formula based on SCr of 0.71 mg/dL). Liver Function Tests: Recent Labs  Lab 02/28/20 1557  AST 33  ALT 28  ALKPHOS 65  BILITOT 0.8  PROT 7.4  ALBUMIN 4.2   Recent Labs  Lab 02/28/20 1557  LIPASE 33   No results for input(s): AMMONIA in the last 168 hours. Coagulation Profile: No results for input(s): INR, PROTIME in the last 168 hours. Cardiac Enzymes: No results for input(s): CKTOTAL, CKMB, CKMBINDEX, TROPONINI in the last 168 hours. BNP (last 3 results) No results for input(s): PROBNP in the last 8760 hours. HbA1C: No results for input(s): HGBA1C in the last 72 hours. CBG: No results for input(s): GLUCAP in the last 168 hours. Lipid Profile: No results for input(s): CHOL, HDL, LDLCALC, TRIG, CHOLHDL, LDLDIRECT in the last 72 hours. Thyroid Function Tests: No results for input(s): TSH, T4TOTAL, FREET4, T3FREE, THYROIDAB in the  last 72 hours. Anemia Panel: No results for input(s): VITAMINB12, FOLATE, FERRITIN, TIBC, IRON, RETICCTPCT in the last 72 hours. Sepsis Labs: No results for input(s): PROCALCITON, LATICACIDVEN in the last 168 hours.  Recent Results (from the past 240 hour(s))  SARS Coronavirus 2 by RT PCR (hospital order, performed in [facility] hospital lab) Nasopharyngeal Nasopharyngeal Swab  Status: None   Collection Time: 02/28/20 10:56 PM   Specimen: Nasopharyngeal Swab  Result Value Ref Range Status   SARS Coronavirus 2 NEGATIVE NEGATIVE Final    Comment: (NOTE) SARS-CoV-2 target nucleic acids are NOT DETECTED. The SARS-CoV-2 RNA is generally detectable in upper and lower respiratory specimens during the acute phase of infection. The lowest concentration of SARS-CoV-2 viral copies this assay can detect is 250 copies / mL. A negative result does not preclude SARS-CoV-2 infection and should not be used as the sole basis for treatment or other patient management decisions.  A negative result may occur with improper specimen collection / handling, submission of specimen other than nasopharyngeal swab, presence of viral mutation(s) within the areas targeted by this assay, and inadequate number of viral copies (<250 copies / mL). A negative result must be combined with clinical observations, patient history, and epidemiological information. Fact Sheet for Patients:   StrictlyIdeas.no Fact Sheet for Healthcare Providers: BankingDealers.co.za This test is not yet approved or cleared  by the [place] FDA and has been authorized for detection and/or diagnosis of SARS-CoV-2 by FDA under an Emergency Use Authorization (EUA).  This EUA will remain in effect (meaning this test can be used) for the duration of the COVID-19 declaration under Section 564(b)(1) of the Act, 21 U.S.C. section 360bbb-3(b)(1), unless the authorization is terminated or revoked  sooner. Performed at [facility], [place] [address], [place], [place] [address]          Radiology Studies: CT Abdomen Pelvis W Contrast  Result Date: 02/28/2020 CLINICAL DATA:  64 year old female with history of abdominal pain and dysuria. Suspected abdominal infection or abscess. EXAM: CT ABDOMEN AND PELVIS WITH CONTRAST TECHNIQUE: Multidetector CT imaging of the abdomen and pelvis was performed using the standard protocol following bolus administration of intravenous contrast. CONTRAST:  141mL OMNIPAQUE IOHEXOL 300 MG/ML  SOLN COMPARISON:  CT the abdomen and pelvis 09/26/2018. FINDINGS: Lower chest: Unremarkable. Hepatobiliary: Diffuse low attenuation throughout the hepatic parenchyma, indicative of hepatic steatosis. No suspicious cystic or solid hepatic lesions. No intra or extrahepatic biliary ductal dilatation. Status post cholecystectomy. Pancreas: No pancreatic mass. No pancreatic ductal dilatation. No pancreatic or peripancreatic fluid collections or inflammatory changes. Spleen: Unremarkable. Adrenals/Urinary Tract: Subcentimeter low-attenuation lesion in the upper pole the left kidney, too small to characterize, statistically likely to represent a tiny cyst. Right kidney and bilateral adrenal glands are normal in appearance. No hydroureteronephrosis. Urinary bladder is normal in appearance. Stomach/Bowel: Normal appearance of the stomach. No pathologic dilatation of small bowel or colon. Numerous colonic diverticulae are noted, particularly in the sigmoid colon where there are some subtle surrounding inflammatory changes in the sigmoid mesocolon, concerning for acute diverticulitis. No discrete diverticular abscess or signs of frank perforation are noted at this time. Normal appendix. Vascular/Lymphatic: Aortic atherosclerosis, without evidence of aneurysm or dissection in the abdominal or pelvic vasculature. No lymphadenopathy noted in the abdomen or pelvis. Reproductive: Status post  hysterectomy. Ovaries are not confidently identified may be surgically absent or atrophic. Other: No significant volume of ascites.  No pneumoperitoneum. Musculoskeletal: There are no aggressive appearing lytic or blastic lesions noted in the visualized portions of the skeleton. IMPRESSION: 1. Findings are concerning for very early acute diverticulitis. No diverticular abscess or signs of frank perforation at this time. 2. Hepatic steatosis. 3. Aortic atherosclerosis. Electronically Signed   By: Vinnie Langton M.D.   On: 02/28/2020 20:24        Scheduled Meds:  enoxaparin (LOVENOX) injection  40 mg Subcutaneous Q24H   gabapentin  200 mg Oral TID   pantoprazole  40 mg Oral Daily   Continuous Infusions:  ciprofloxacin 400 mg (03/01/20 1217)   metronidazole 500 mg (03/01/20 1058)     Time spent: 25 minutes with over 50% of the time coordinating the patient's care    Jae Dire, DO [facility] Pager [phone]  Call night coverage person covering after 7pm

## 2020-03-01 NOTE — Progress Notes (Signed)
Chaplain responded to Spiritual Consult indicating patient is experiencing major life transition.  Chaplain initiated ministry of presence by listening to [name]'s cares and concerns.  Her husband was present at bedside and spoke of his concerns as well.  Chaplain prayed with [name], carrying their cares, concerns and fears to the [facility], beseeching relief from their current situation and restoration to a fruitful life.  Chaplain is standing by for additional support as needed.  Vernell Morgans Chaplain Resident

## 2020-03-02 LAB — CBC
HCT: 37.8 % (ref 36.0–46.0)
Hemoglobin: 11.9 g/dL — ABNORMAL LOW (ref 12.0–15.0)
MCH: 32.1 pg (ref 26.0–34.0)
MCHC: 31.5 g/dL (ref 30.0–36.0)
MCV: 101.9 fL — ABNORMAL HIGH (ref 80.0–100.0)
Platelets: 242 10*3/uL (ref 150–400)
RBC: 3.71 MIL/uL — ABNORMAL LOW (ref 3.87–5.11)
RDW: 13.6 % (ref 11.5–15.5)
WBC: 3.7 10*3/uL — ABNORMAL LOW (ref 4.0–10.5)
nRBC: 0 % (ref 0.0–0.2)

## 2020-03-02 LAB — BASIC METABOLIC PANEL
Anion gap: 8 (ref 5–15)
BUN: <5 mg/dL — ABNORMAL LOW (ref 8–23)
CO2: 27 mmol/L (ref 22–32)
Calcium: 9.3 mg/dL (ref 8.9–10.3)
Chloride: 107 mmol/L (ref 98–111)
Creatinine, Ser: 0.67 mg/dL (ref 0.44–1.00)
GFR calc Af Amer: >60 mL/min (ref >60)
GFR calc non Af Amer: >60 mL/min (ref >60)
Glucose, Bld: 84 mg/dL (ref 70–99)
Potassium: 4.2 mmol/L (ref 3.5–5.1)
Sodium: 142 mmol/L (ref 135–145)

## 2020-03-02 MED ORDER — OXYCODONE-ACETAMINOPHEN 7.5-325 MG PO TABS
1.0000 | ORAL_TABLET | Freq: Four times a day (QID) | ORAL | Status: DC | PRN
  Administered 2020-03-02 – 2020-03-04 (×5): 1 via ORAL
  Filled 2020-03-02 (×5): qty 1

## 2020-03-02 MED ORDER — HYDROMORPHONE HCL 1 MG/ML IJ SOLN
0.5000 mg | INTRAMUSCULAR | Status: DC | PRN
  Administered 2020-03-02 – 2020-03-04 (×7): 0.5 mg via INTRAVENOUS
  Filled 2020-03-02 (×7): qty 1

## 2020-03-02 MED ORDER — SODIUM CHLORIDE 0.9 % IV SOLN
INTRAVENOUS | Status: DC | PRN
  Administered 2020-03-02 – 2020-03-03 (×2): 500 mL via INTRAVENOUS

## 2020-03-02 NOTE — Progress Notes (Signed)
PROGRESS NOTE    [name]    Code Status: Full Code  OHY:[id number] DOB: 27-Sep-1956 DOA: 02/28/2020 LOS: 2 days  PCP: Patient, No Pcp Per CC: No chief complaint on file.      Hospital Summary   This is a 64 year old female with history of diverticulosis, anxiety/depression, alcohol abuse currently in remission and history of alcohol withdrawal seizures who came to the hospital for evaluation of abdominal pain and was admitted on 5/12 for sigmoid diverticulitis without abscess on imaging and was started on Cipro and Flagyl and clear liquid diet.  Never had a colonoscopy before.  Continue to have abdominal pain and was changed to n.p.o. on 5/13.    A & P   Principal Problem:   Acute diverticulitis Active Problems:   Major depressive disorder, 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 psychotic features ([place])   Alcohol dependence with alcohol-induced mood disorder (HCC)   Neuropathy   GERD (gastroesophageal reflux disease)   Diverticulitis   1. Acute sigmoid diverticulitis without abscess a. Pain improving but still persistent requiring IV pain meds b. Continue Cipro/Flagyl c. Patient wishes to advance diet, advance to full liquid  d. Change Vicodin for Percocet (allergy to Percocet in the past when she was following with pain management-and is to reestablish with pain management once discharged) e. titrate off Dilaudid if able, will change to 0.5 mg as needed f. Once she is tolerating pain on p.o. meds, remains afebrile and tolerating p.o. intake will discharge on 7 to 10 days of Augmentin, hopefully in the next day or 2 g. Never had a colonoscopy. she will need a colonoscopy in 6 to 8 weeks post hospitalization  2. Anxiety/depression a. Currently stable off medications  3. Peripheral neuropathy on gabapentin  4. GERD on pantoprazole  5. History of severe alcohol abuse with delirium tremens and alcohol withdrawal seizures, currently in remission a. Reportedly last drink 2 months  ago b. No signs of alcohol withdrawal  DVT prophylaxis: Lovenox Family Communication: Patient updated at bedside Disposition Plan: Continues to require IV antibiotics and pain meds, hopefully discharge in the next 24 to 48 hours if tolerating advancement of diet and pain. Status is: Inpatient  Remains inpatient appropriate because:IV treatments appropriate due to intensity of illness or inability to take PO   Dispo: The patient is from: Home              Anticipated d/c is to: Home              Anticipated d/c date is: 2 days              Patient currently is not medically stable to d/c.      Pressure injury documentation    None  Consultants  None  Procedures  None  Antibiotics   Anti-infectives (From admission, onward)   Start     Dose/Rate Route Frequency Ordered Stop   02/29/20 1000  ciprofloxacin (CIPRO) IVPB 400 mg     400 mg 200 mL/hr over 60 Minutes Intravenous Every 12 hours 02/28/20 2305 03/07/20 0959   02/29/20 1000  metroNIDAZOLE (FLAGYL) IVPB 500 mg     500 mg 100 mL/hr over 60 Minutes Intravenous Every 12 hours 02/28/20 2305 03/07/20 0959   02/28/20 2130  ciprofloxacin (CIPRO) IVPB 400 mg     400 mg 200 mL/hr over 60 Minutes Intravenous  Once 02/28/20 2122 02/28/20 2236   02/28/20 2130  metroNIDAZOLE (FLAGYL) IVPB 500 mg     500 mg 100 mL/hr  over 60 Minutes Intravenous  Once 02/28/20 2122 02/28/20 2236        Subjective   Need a long discussion regarding her pain today and how she is very frustrated she still hospitalized.  She does not want to continue using IV pain meds and wants to go home but understands she needs to remain hospitalized until this can get under better control.  She states that Vicodin does not help her and Percocet used to help in the past when she follow with pain management.  She is wondering if she can change Vicodin for Percocet for better tolerance.  She is going to try and hold off on further IV Dilaudid unless absolutely  necessary to help with discharge planning.  Also states that she wishes to advance her diet and feels she could tolerate full liquids.  Objective   Vitals:   03/01/20 2323 03/02/20 0416 03/02/20 0727 03/02/20 1128  BP: (!) 141/87 124/79 (!) 143/83 121/72  Pulse: 60 72 (!) 59 72  Resp: 14 14 20 20   Temp: 97.8 F (36.6 C) 97.6 F (36.4 C) 97.6 F (36.4 C) 97.6 F (36.4 C)  TempSrc: Oral Oral Oral Oral  SpO2: 98% 100% 94% 100%  Weight:      Height:       No intake or output data in the 24 hours ending 03/02/20 1525 Filed Weights   02/29/20 0228 03/01/20 0341  Weight: 61.7 kg 62.6 kg    Examination:  Physical Exam Vitals and nursing note reviewed.  Constitutional:      Appearance: Normal appearance.  HENT:     Head: Normocephalic and atraumatic.  Eyes:     Conjunctiva/sclera: Conjunctivae normal.  Cardiovascular:     Rate and Rhythm: Normal rate and regular rhythm.  Pulmonary:     Effort: Pulmonary effort is normal.     Breath sounds: Normal breath sounds.  Abdominal:     General: Abdomen is flat.     Palpations: Abdomen is soft.     Tenderness: There is abdominal tenderness.  Musculoskeletal:        General: No swelling or tenderness.  Skin:    Coloration: Skin is not jaundiced or pale.  Neurological:     Mental Status: She is alert. Mental status is at baseline.  Psychiatric:        Mood and Affect: Mood normal.        Behavior: Behavior normal.     Data Reviewed: I have personally reviewed following labs and imaging studies  CBC: Recent Labs  Lab 02/28/20 1557 02/29/20 0111 03/01/20 0433 03/02/20 0653  WBC 6.4 4.5 4.4 3.7*  HGB 13.5 14.0 12.3 11.9*  HCT 42.2 43.9 40.1 37.8  MCV 101.2* 102.1* 104.2* 101.9*  PLT 262 264 220 242   Basic Metabolic Panel: Recent Labs  Lab 02/28/20 1557 02/29/20 0111 03/01/20 0433 03/02/20 0653  NA 139 139 145 142  K 3.8 3.7 5.0 4.2  CL 101 105 112* 107  CO2 25 21* 25 27  GLUCOSE 89 98 88 84  BUN 6* 6* <5*  <5*  CREATININE 0.71 0.67 0.71 0.67  CALCIUM 9.9 9.4 9.3 9.3  MG  --   --  1.7  --    GFR: Estimated Creatinine Clearance: 60.2 mL/min (by C-G formula based on SCr of 0.67 mg/dL). Liver Function Tests: Recent Labs  Lab 02/28/20 1557  AST 33  ALT 28  ALKPHOS 65  BILITOT 0.8  PROT 7.4  ALBUMIN 4.2  Recent Labs  Lab 02/28/20 1557  LIPASE 33   No results for input(s): AMMONIA in the last 168 hours. Coagulation Profile: No results for input(s): INR, PROTIME in the last 168 hours. Cardiac Enzymes: No results for input(s): CKTOTAL, CKMB, CKMBINDEX, TROPONINI in the last 168 hours. BNP (last 3 results) No results for input(s): PROBNP in the last 8760 hours. HbA1C: No results for input(s): HGBA1C in the last 72 hours. CBG: No results for input(s): GLUCAP in the last 168 hours. Lipid Profile: No results for input(s): CHOL, HDL, LDLCALC, TRIG, CHOLHDL, LDLDIRECT in the last 72 hours. Thyroid Function Tests: No results for input(s): TSH, T4TOTAL, FREET4, T3FREE, THYROIDAB in the last 72 hours. Anemia Panel: No results for input(s): VITAMINB12, FOLATE, FERRITIN, TIBC, IRON, RETICCTPCT in the last 72 hours. Sepsis Labs: No results for input(s): PROCALCITON, LATICACIDVEN in the last 168 hours.  Recent Results (from the past 240 hour(s))  SARS Coronavirus 2 by RT PCR (hospital order, performed in [facility] hospital lab) Nasopharyngeal Nasopharyngeal Swab     Status: None   Collection Time: 02/28/20 10:56 PM   Specimen: Nasopharyngeal Swab  Result Value Ref Range Status   SARS Coronavirus 2 NEGATIVE NEGATIVE Final    Comment: (NOTE) SARS-CoV-2 target nucleic acids are NOT DETECTED. The SARS-CoV-2 RNA is generally detectable in upper and lower respiratory specimens during the acute phase of infection. The lowest concentration of SARS-CoV-2 viral copies this assay can detect is 250 copies / mL. A negative result does not preclude SARS-CoV-2 infection and should not be used as  the sole basis for treatment or other patient management decisions.  A negative result may occur with improper specimen collection / handling, submission of specimen other than nasopharyngeal swab, presence of viral mutation(s) within the areas targeted by this assay, and inadequate number of viral copies (<250 copies / mL). A negative result must be combined with clinical observations, patient history, and epidemiological information. Fact Sheet for Patients:   BoilerBrush.com.cy Fact Sheet for Healthcare Providers: https://pope.com/ This test is not yet approved or cleared  by the [place] FDA and has been authorized for detection and/or diagnosis of SARS-CoV-2 by FDA under an Emergency Use Authorization (EUA).  This EUA will remain in effect (meaning this test can be used) for the duration of the COVID-19 declaration under Section 564(b)(1) of the Act, 21 U.S.C. section 360bbb-3(b)(1), unless the authorization is terminated or revoked sooner. Performed at [facility], [address], [place], [place] [address]          Radiology Studies: No results found.      Scheduled Meds: . enoxaparin (LOVENOX) injection  40 mg Subcutaneous Q24H  . gabapentin  200 mg Oral TID  . pantoprazole  40 mg Oral Daily   Continuous Infusions: . ciprofloxacin 400 mg (03/02/20 1101)  . metronidazole 500 mg (03/02/20 0919)     Time spent: 35 minutes with over 50% of the time coordinating the patient's care    Jae Dire, DO [facility] Pager [phone]  Call night coverage person covering after 7pm

## 2020-03-03 NOTE — Plan of Care (Signed)
Plan of care reviewed:  Problem: Clinical Measurements: Goal: Will remain free from infection Outcome: Progressing: remained afebrile. IV antibiotics Flagyl and Ciprofloxacin q 12 hr.   Problem: Clinical Measurements: Goal: Cardiovascular complication will be avoided Outcome: Progressing: hemodynamically stable. BP with in normal limits. Not on Tele-cardiac monitor.   Problem: Clinical Measurements: Goal: Respiratory complications will improve Outcome: Progressing: on room air, no SOB.   Problem: Activity: Goal: Risk for activity intolerance will decrease Outcome: Progressing: ambulated independently. Tolerated activities well.   Problem: Nutrition: Goal: Adequate nutrition will be maintained Outcome: Progressing: on full liquid diet with good appetite. Denied nausea, no vomiting. Active bowel sound all 4 quadrants.    Problem: Pain Managment: Goal: General experience of comfort will improve Outcome: Progressing: pain tolerated well, Dilaudid and Percocet pain med given PRN. Pt able to sleep well tonight.   Problem: Elimination: Goal: Will not experience complications related to bowel motility Outcome: Progressing: stated having 3 times of watery diarrhea yesterday. She hasn't eaten any solid food. Normal urination.   No acute distress noted. We will continue to monitor.  Filiberto Pinks, RN

## 2020-03-03 NOTE — Progress Notes (Signed)
PROGRESS NOTE    [name]    Code Status: Full Code  BMW:[id number] DOB: 12/14/1955 DOA: 02/28/2020 LOS: 3 days  PCP: Patient, No Pcp Per CC: No chief complaint on file.      Hospital Summary   This is a 64 year old female with history of diverticulosis, anxiety/depression, alcohol abuse currently in remission and history of alcohol withdrawal seizures who came to the hospital for evaluation of abdominal pain and was admitted on 5/12 for sigmoid diverticulitis without abscess on imaging and was started on Cipro and Flagyl and clear liquid diet.  Never had a colonoscopy before.  Continue to have abdominal pain and was changed to n.p.o. on 5/13.    A & P   Principal Problem:   Acute diverticulitis Active Problems:   Major depressive disorder, 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 psychotic features (HCC)   Alcohol dependence with alcohol-induced mood disorder (HCC)   Neuropathy   GERD (gastroesophageal reflux disease)   Diverticulitis   1. Acute sigmoid diverticulitis without abscess a. Pain improving but still persistent requiring IV pain meds b. Day 5/7-10 antibiotics, currently IV Cipro/Flagyl. c. Once she is tolerating pain on p.o. meds, remains afebrile and tolerating p.o. intake will discharge on 7 to 10 days of Augmentin, hopefully in the next day or 2 d. Advance to soft diet today e. Having diarrhea occasionally since admission with decreasing white count no signs of C. difficile. Hold laxatives f. Never had a colonoscopy. she will need a colonoscopy in 6 to 8 weeks post hospitalization  2. Anxiety/depression a. Currently stable off medications  3. Peripheral neuropathy on gabapentin  4. GERD on pantoprazole  5. History of severe alcohol abuse with delirium tremens and alcohol withdrawal seizures, currently in remission a. Reportedly last drink 2 months ago b. No signs of alcohol withdrawal  DVT prophylaxis: Lovenox Family Communication: Patient updated at  bedside Disposition Plan: Continues to require IV antibiotics and IV pain meds for persistent but improved left lower quadrant pain. Hopefully discharge tomorrow on oral antibiotics if tolerating diet Status is: Inpatient  Remains inpatient appropriate because:IV treatments appropriate due to intensity of illness or inability to take PO   Dispo: The patient is from: Home              Anticipated d/c is to: Home              Anticipated d/c date is: 2 days              Patient currently is not medically stable to d/c.      Pressure injury documentation    None  Consultants  None  Procedures  None  Antibiotics   Anti-infectives (From admission, onward)   Start     Dose/Rate Route Frequency Ordered Stop   02/29/20 1000  ciprofloxacin (CIPRO) IVPB 400 mg     400 mg 200 mL/hr over 60 Minutes Intravenous Every 12 hours 02/28/20 2305 03/07/20 0959   02/29/20 1000  metroNIDAZOLE (FLAGYL) IVPB 500 mg     500 mg 100 mL/hr over 60 Minutes Intravenous Every 12 hours 02/28/20 2305 03/07/20 0959   02/28/20 2130  ciprofloxacin (CIPRO) IVPB 400 mg     400 mg 200 mL/hr over 60 Minutes Intravenous  Once 02/28/20 2122 02/28/20 2236   02/28/20 2130  metroNIDAZOLE (FLAGYL) IVPB 500 mg     500 mg 100 mL/hr over 60 Minutes Intravenous  Once 02/28/20 2122 02/28/20 2236        Subjective  Continues to complain of left lower quadrant pain. She is hungry and wishes to eat something today. She is really hoping to go home today but is concerned about leaving with persistent pain and is hoping to stay one more night for IV antibiotics which I agree after discussion with her. No fever, nausea or vomiting. Is having some diarrhea. No other issues.  Objective   Vitals:   03/02/20 2355 03/03/20 0332 03/03/20 0811 03/03/20 1150  BP: 132/78 (!) 144/81 (!) 145/91 123/81  Pulse: 67 (!) 58 66 65  Resp: 12 12 19 20   Temp: 97.9 F (36.6 C) 97.8 F (36.6 C) 99 F (37.2 C) 97.6 F (36.4 C)   TempSrc: Oral Oral Axillary Oral  SpO2: 96% 94% 100% 100%  Weight:  61.4 kg    Height:        Intake/Output Summary (Last 24 hours) at 03/03/2020 1404 Last data filed at 03/03/2020 0900 Gross per 24 hour  Intake 1224.79 ml  Output --  Net 1224.79 ml   Filed Weights   02/29/20 0228 03/01/20 0341 03/03/20 0332  Weight: 61.7 kg 62.6 kg 61.4 kg    Examination:  Physical Exam Vitals and nursing note reviewed.  Constitutional:      Appearance: Normal appearance.  HENT:     Head: Normocephalic and atraumatic.  Eyes:     Conjunctiva/sclera: Conjunctivae normal.  Cardiovascular:     Rate and Rhythm: Normal rate and regular rhythm.  Pulmonary:     Effort: Pulmonary effort is normal.     Breath sounds: Normal breath sounds.  Abdominal:     General: Abdomen is flat.     Tenderness: There is abdominal tenderness.  Musculoskeletal:        General: No swelling or tenderness.  Skin:    Coloration: Skin is not jaundiced or pale.  Neurological:     Mental Status: She is alert. Mental status is at baseline.  Psychiatric:        Mood and Affect: Mood normal.        Behavior: Behavior normal.     Data Reviewed: I have personally reviewed following labs and imaging studies  CBC: Recent Labs  Lab 02/28/20 1557 02/29/20 0111 03/01/20 0433 03/02/20 0653  WBC 6.4 4.5 4.4 3.7*  HGB 13.5 14.0 12.3 11.9*  HCT 42.2 43.9 40.1 37.8  MCV 101.2* 102.1* 104.2* 101.9*  PLT 262 264 220 242   Basic Metabolic Panel: Recent Labs  Lab 02/28/20 1557 02/29/20 0111 03/01/20 0433 03/02/20 0653  NA 139 139 145 142  K 3.8 3.7 5.0 4.2  CL 101 105 112* 107  CO2 25 21* 25 27  GLUCOSE 89 98 88 84  BUN 6* 6* <5* <5*  CREATININE 0.71 0.67 0.71 0.67  CALCIUM 9.9 9.4 9.3 9.3  MG  --   --  1.7  --    GFR: Estimated Creatinine Clearance: 59.7 mL/min (by C-G formula based on SCr of 0.67 mg/dL). Liver Function Tests: Recent Labs  Lab 02/28/20 1557  AST 33  ALT 28  ALKPHOS 65  BILITOT  0.8  PROT 7.4  ALBUMIN 4.2   Recent Labs  Lab 02/28/20 1557  LIPASE 33   No results for input(s): AMMONIA in the last 168 hours. Coagulation Profile: No results for input(s): INR, PROTIME in the last 168 hours. Cardiac Enzymes: No results for input(s): CKTOTAL, CKMB, CKMBINDEX, TROPONINI in the last 168 hours. BNP (last 3 results) No results for input(s): PROBNP in the last 8760  hours. HbA1C: No results for input(s): HGBA1C in the last 72 hours. CBG: No results for input(s): GLUCAP in the last 168 hours. Lipid Profile: No results for input(s): CHOL, HDL, LDLCALC, TRIG, CHOLHDL, LDLDIRECT in the last 72 hours. Thyroid Function Tests: No results for input(s): TSH, T4TOTAL, FREET4, T3FREE, THYROIDAB in the last 72 hours. Anemia Panel: No results for input(s): VITAMINB12, FOLATE, FERRITIN, TIBC, IRON, RETICCTPCT in the last 72 hours. Sepsis Labs: No results for input(s): PROCALCITON, LATICACIDVEN in the last 168 hours.  Recent Results (from the past 240 hour(s))  SARS Coronavirus 2 by RT PCR (hospital order, performed in [facility] hospital lab) Nasopharyngeal Nasopharyngeal Swab     Status: None   Collection Time: 02/28/20 10:56 PM   Specimen: Nasopharyngeal Swab  Result Value Ref Range Status   SARS Coronavirus 2 NEGATIVE NEGATIVE Final    Comment: (NOTE) SARS-CoV-2 target nucleic acids are NOT DETECTED. The SARS-CoV-2 RNA is generally detectable in upper and lower respiratory specimens during the acute phase of infection. The lowest concentration of SARS-CoV-2 viral copies this assay can detect is 250 copies / mL. A negative result does not preclude SARS-CoV-2 infection and should not be used as the sole basis for treatment or other patient management decisions.  A negative result may occur with improper specimen collection / handling, submission of specimen other than nasopharyngeal swab, presence of viral mutation(s) within the areas targeted by this assay, and  inadequate number of viral copies (<250 copies / mL). A negative result must be combined with clinical observations, patient history, and epidemiological information. Fact Sheet for Patients:   StrictlyIdeas.no Fact Sheet for Healthcare Providers: BankingDealers.co.za This test is not yet approved or cleared  by the [place] FDA and has been authorized for detection and/or diagnosis of SARS-CoV-2 by FDA under an Emergency Use Authorization (EUA).  This EUA will remain in effect (meaning this test can be used) for the duration of the COVID-19 declaration under Section 564(b)(1) of the Act, 21 U.S.C. section 360bbb-3(b)(1), unless the authorization is terminated or revoked sooner. Performed at [facility], [address], [place], [place] [address]          Radiology Studies: No results found.      Scheduled Meds: . enoxaparin (LOVENOX) injection  40 mg Subcutaneous Q24H  . gabapentin  200 mg Oral TID  . pantoprazole  40 mg Oral Daily   Continuous Infusions: . sodium chloride 500 mL (03/02/20 2224)  . ciprofloxacin 400 mg (03/03/20 1310)  . metronidazole 500 mg (03/03/20 1037)     Time spent: 30 minutes with over 50% of the time coordinating the patient's care    Harold Hedge, DO [facility] Pager [phone]  Call night coverage person covering after 7pm

## 2020-03-04 LAB — CBC
HCT: 37.3 % (ref 36.0–46.0)
Hemoglobin: 12.3 g/dL (ref 12.0–15.0)
MCH: 33 pg (ref 26.0–34.0)
MCHC: 33 g/dL (ref 30.0–36.0)
MCV: 100 fL (ref 80.0–100.0)
Platelets: 280 10*3/uL (ref 150–400)
RBC: 3.73 MIL/uL — ABNORMAL LOW (ref 3.87–5.11)
RDW: 14 % (ref 11.5–15.5)
WBC: 3.8 10*3/uL — ABNORMAL LOW (ref 4.0–10.5)
nRBC: 0 % (ref 0.0–0.2)

## 2020-03-04 MED ORDER — AMOXICILLIN-POT CLAVULANATE 875-125 MG PO TABS: 1.0000 | ORAL_TABLET | Freq: Two times a day (BID) | ORAL | 0 refills | Status: AC

## 2020-03-04 MED ORDER — OXYCODONE-ACETAMINOPHEN 7.5-325 MG PO TABS: 1.0000 | ORAL_TABLET | Freq: Four times a day (QID) | ORAL | 0 refills | Status: AC | PRN

## 2020-03-04 NOTE — Plan of Care (Signed)
Continue to monitor

## 2020-03-04 NOTE — Discharge Summary (Signed)
Physician Discharge Summary  [name] DDU:[id number] DOB: [birth year]   PCP: Patient, No Pcp Per  Admit date: 02/28/2020 Discharge date: 03/04/2020 Length of Stay: 4 days   Code Status: Full Code  Admitted From:  Home Discharged to:   Home Home Health:  None  Equipment/Devices:  None Discharge Condition: Improvinge  Recommendations for Outpatient Follow-up   1. Follow up with PCP in 1 week 2. Follow up BMP/CBC  3. Needs outpatient colonoscopy scheduled  Hospital Summary  This is a 64 year old female with history of diverticulosis, anxiety/depression, alcohol abuse currently in remission and history of alcohol withdrawal seizures who came to the hospital for evaluation of abdominal pain and was admitted on 5/12 for sigmoid diverticulitis without abscess on imaging and was started on Cipro and Flagyl and clear liquid diet.  Never had a colonoscopy before.  Continue to have abdominal pain and was changed to n.p.o. on 5/13.  Diet was slowly advanced following 5/13 and patient was able to tolerate p.o. intake and soft diet prior to discharge on 5/17.  However, she did admit to some occasional left lower quadrant pain but stated that she wanted to go home and continue medical therapy at home after discussion plan was to go home with Augmentin for total 10 days antibiotics, continue soft diet, follow-up labs in PCP appointment in the next week and return to the hospital if any worsening symptoms.  A & P   Principal Problem:   Acute diverticulitis Active Problems:   Major depressive disorder, 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 psychotic features (HCC)   Alcohol dependence with alcohol-induced mood disorder (HCC)   Neuropathy   GERD (gastroesophageal reflux disease)   Diverticulitis     1. Acute sigmoid diverticulitis without abscess a. Completed 5/10 days IV antibiotics on Cipro/Flagyl with improved but not resolved symptoms and tolerated soft diet b. Discharged on Augmentin for total 10  days antibiotics c. Follow-up CBC and BMP with PCP appointment this week d. GI referral as she has never had a colonoscopy. She will need a colonoscopy in 6 to 8 weeks post hospitalization  2. Anxiety/depression a. Currently stable off medications  3. Peripheral neuropathy on gabapentin  4. GERD on pantoprazole  5. History of severe alcohol abuse with delirium tremens and alcohol withdrawal seizures, currently in remission a. Reportedly last drink 2 months ago b. No signs of alcohol withdrawal     Consultants  . None  Procedures  . None  Antibiotics   Anti-infectives (From admission, onward)   Start     Dose/Rate Route Frequency Ordered Stop   03/04/20 0000  amoxicillin-clavulanate (AUGMENTIN) 875-125 MG tablet     1 tablet Oral 2 times daily 03/04/20 0846 03/09/20 2359   02/29/20 1000  ciprofloxacin (CIPRO) IVPB 400 mg     400 mg 200 mL/hr over 60 Minutes Intravenous Every 12 hours 02/28/20 2305 03/07/20 0959   02/29/20 1000  metroNIDAZOLE (FLAGYL) IVPB 500 mg     500 mg 100 mL/hr over 60 Minutes Intravenous Every 12 hours 02/28/20 2305 03/07/20 0959   02/28/20 2130  ciprofloxacin (CIPRO) IVPB 400 mg     400 mg 200 mL/hr over 60 Minutes Intravenous  Once 02/28/20 2122 02/28/20 2236   02/28/20 2130  metroNIDAZOLE (FLAGYL) IVPB 500 mg     500 mg 100 mL/hr over 60 Minutes Intravenous  Once 02/28/20 2122 02/28/20 2236       Subjective  Patient seen and examined at bedside no acute distress and resting comfortably.  No events  overnight.  Tolerating diet. In good spirits.  She is requesting to be discharged and complete medical therapy at home which we discussed at length.  Denies any chest pain, shortness of breath, fever, nausea, vomiting, urinary.  Admits to intermittent mild left lower quadrant abdominal pain and tolerating p.o. intake.  Had pain last night after having broccoli and gas pains. Otherwise ROS negative    Objective   Discharge Exam: Vitals:    03/04/20 0437 03/04/20 0812  BP: 138/84 113/77  Pulse: 72 72  Resp: 18 18  Temp: 98.2 F (36.8 C) 97.9 F (36.6 C)  SpO2: 96% 97%   Vitals:   03/04/20 0013 03/04/20 0437 03/04/20 0533 03/04/20 0812  BP: 137/83 138/84  113/77  Pulse: 81 72  72  Resp:  18  18  Temp: 98 F (36.7 C) 98.2 F (36.8 C)  97.9 F (36.6 C)  TempSrc: Oral Oral  Oral  SpO2: 98% 96%  97%  Weight:   61.9 kg   Height:        Physical Exam Vitals and nursing note reviewed.  Constitutional:      Appearance: Normal appearance.  HENT:     Head: Normocephalic and atraumatic.  Eyes:     Conjunctiva/sclera: Conjunctivae normal.  Cardiovascular:     Rate and Rhythm: Normal rate and regular rhythm.  Pulmonary:     Effort: Pulmonary effort is normal.     Breath sounds: Normal breath sounds.  Abdominal:     General: Abdomen is flat. There is no distension.     Palpations: Abdomen is soft.     Comments: Mild left lower quadrant tenderness to palpation  Musculoskeletal:        General: No swelling or tenderness.  Skin:    Coloration: Skin is not jaundiced or pale.  Neurological:     Mental Status: She is alert. Mental status is at baseline.  Psychiatric:        Mood and Affect: Mood normal.        Behavior: Behavior normal.       The results of significant diagnostics from this hospitalization (including imaging, microbiology, ancillary and laboratory) are listed below for reference.     Microbiology: Recent Results (from the past 240 hour(s))  SARS Coronavirus 2 by RT PCR (hospital order, performed in [facility] hospital lab) Nasopharyngeal Nasopharyngeal Swab     Status: None   Collection Time: 02/28/20 10:56 PM   Specimen: Nasopharyngeal Swab  Result Value Ref Range Status   SARS Coronavirus 2 NEGATIVE NEGATIVE Final    Comment: (NOTE) SARS-CoV-2 target nucleic acids are NOT DETECTED. The SARS-CoV-2 RNA is generally detectable in upper and lower respiratory specimens during the acute phase  of infection. The lowest concentration of SARS-CoV-2 viral copies this assay can detect is 250 copies / mL. A negative result does not preclude SARS-CoV-2 infection and should not be used as the sole basis for treatment or other patient management decisions.  A negative result may occur with improper specimen collection / handling, submission of specimen other than nasopharyngeal swab, presence of viral mutation(s) within the areas targeted by this assay, and inadequate number of viral copies (<250 copies / mL). A negative result must be combined with clinical observations, patient history, and epidemiological information. Fact Sheet for Patients:   StrictlyIdeas.no Fact Sheet for Healthcare Providers: BankingDealers.co.za This test is not yet approved or cleared  by the [place] FDA and has been authorized for detection and/or diagnosis of SARS-CoV-2  by FDA under an Emergency Use Authorization (EUA).  This EUA will remain in effect (meaning this test can be used) for the duration of the COVID-19 declaration under Section 564(b)(1) of the Act, 21 U.S.C. section 360bbb-3(b)(1), unless the authorization is terminated or revoked sooner. Performed at [facility], [address] [place], [place] [address]      Labs: BNP (last 3 results) No results for input(s): BNP in the last 8760 hours. Basic Metabolic Panel: Recent Labs  Lab 02/28/20 1557 02/29/20 0111 03/01/20 0433 03/02/20 0653  NA 139 139 145 142  K 3.8 3.7 5.0 4.2  CL 101 105 112* 107  CO2 25 21* 25 27  GLUCOSE 89 98 88 84  BUN 6* 6* <5* <5*  CREATININE 0.71 0.67 0.71 0.67  CALCIUM 9.9 9.4 9.3 9.3  MG  --   --  1.7  --    Liver Function Tests: Recent Labs  Lab 02/28/20 1557  AST 33  ALT 28  ALKPHOS 65  BILITOT 0.8  PROT 7.4  ALBUMIN 4.2   Recent Labs  Lab 02/28/20 1557  LIPASE 33   No results for input(s): AMMONIA in the last 168  hours. CBC: Recent Labs  Lab 02/28/20 1557 02/29/20 0111 03/01/20 0433 03/02/20 0653 03/04/20 0242  WBC 6.4 4.5 4.4 3.7* 3.8*  HGB 13.5 14.0 12.3 11.9* 12.3  HCT 42.2 43.9 40.1 37.8 37.3  MCV 101.2* 102.1* 104.2* 101.9* 100.0  PLT 262 264 220 242 280   Cardiac Enzymes: No results for input(s): CKTOTAL, CKMB, CKMBINDEX, TROPONINI in the last 168 hours. BNP: Invalid input(s): POCBNP CBG: No results for input(s): GLUCAP in the last 168 hours. D-Dimer No results for input(s): DDIMER in the last 72 hours. Hgb A1c No results for input(s): HGBA1C in the last 72 hours. Lipid Profile No results for input(s): CHOL, HDL, LDLCALC, TRIG, CHOLHDL, LDLDIRECT in the last 72 hours. Thyroid function studies No results for input(s): TSH, T4TOTAL, T3FREE, THYROIDAB in the last 72 hours.  Invalid input(s): FREET3 Anemia work up No results for input(s): VITAMINB12, FOLATE, FERRITIN, TIBC, IRON, RETICCTPCT in the last 72 hours. Urinalysis    Component Value Date/Time   COLORURINE YELLOW 02/28/2020 1618   APPEARANCEUR CLEAR 02/28/2020 1618   LABSPEC 1.003 (L) 02/28/2020 1618   PHURINE 6.0 02/28/2020 1618   GLUCOSEU NEGATIVE 02/28/2020 1618   HGBUR NEGATIVE 02/28/2020 1618   BILIRUBINUR NEGATIVE 02/28/2020 1618   KETONESUR NEGATIVE 02/28/2020 1618   PROTEINUR NEGATIVE 02/28/2020 1618   UROBILINOGEN 0.2 07/14/2019 1451   NITRITE NEGATIVE 02/28/2020 1618   LEUKOCYTESUR NEGATIVE 02/28/2020 1618   Sepsis Labs Invalid input(s): PROCALCITONIN,  WBC,  LACTICIDVEN Microbiology Recent Results (from the past 240 hour(s))  SARS Coronavirus 2 by RT PCR (hospital order, performed in [facility] hospital lab) Nasopharyngeal Nasopharyngeal Swab     Status: None   Collection Time: 02/28/20 10:56 PM   Specimen: Nasopharyngeal Swab  Result Value Ref Range Status   SARS Coronavirus 2 NEGATIVE NEGATIVE Final    Comment: (NOTE) SARS-CoV-2 target nucleic acids are NOT DETECTED. The SARS-CoV-2 RNA is  generally detectable in upper and lower respiratory specimens during the acute phase of infection. The lowest concentration of SARS-CoV-2 viral copies this assay can detect is 250 copies / mL. A negative result does not preclude SARS-CoV-2 infection and should not be used as the sole basis for treatment or other patient management decisions.  A negative result may occur with improper specimen collection / handling, submission  of specimen other than nasopharyngeal swab, presence of viral mutation(s) within the areas targeted by this assay, and inadequate number of viral copies (<250 copies / mL). A negative result must be combined with clinical observations, patient history, and epidemiological information. Fact Sheet for Patients:   BoilerBrush.com.cy Fact Sheet for Healthcare Providers: https://pope.com/ This test is not yet approved or cleared  by the [place] FDA and has been authorized for detection and/or diagnosis of SARS-CoV-2 by FDA under an Emergency Use Authorization (EUA).  This EUA will remain in effect (meaning this test can be used) for the duration of the COVID-19 declaration under Section 564(b)(1) of the Act, 21 U.S.C. section 360bbb-3(b)(1), unless the authorization is terminated or revoked sooner. Performed at [facility], [address], [place], [place] [address]     Discharge Instructions     Discharge Instructions    Ambulatory referral to Gastroenterology   Complete by: As directed    Screening/diagnostic colonoscopy 6-8 weeks post diverticulitis. Never had a colonoscopy   What is the reason for referral?: Colonoscopy   DIET SOFT   Complete by: As directed    Diet - low sodium heart healthy   Complete by: As directed    Discharge instructions   Complete by: As directed    You were seen and examined in the hospital for diverticulitis and cared for by a hospitalist.   Upon Discharge:  - take  Augmentin twice daily for 5 days for a total of 10 days of antibiotics. If you have persistent/worsening/unctonrollable diarrhea while taking this medication then you should contact your primary care physician as this can be a sign of a GI infection.  - if your abdominal symptoms worsen, you have vomiting and are not tolerating eating then you should return to the ED, as we discussed - you can take Percocet for pain over the next three days and after that switch to Tylenol if still needed - Make an appointment with your primary care physician within 7 days and Get lab work prior to your follow up appointment with your PCP - establish care with a GI doctor for a colonoscopy in 6-8 weeks Bring all home medications to your appointment to review Request that your primary physician go over all hospital tests and procedures/radiological results at the follow up.   Please get all hospital records sent to your physician by signing a hospital release before you go home.   Read the complete instructions along with all the possible side effects for all the medicines you take and that have been prescribed to you. Take any new medicines after you have completely understood and accept all the possible adverse reactions/side effects.   If you have any questions about your discharge medications or the care you received while you were in the hospital, you can call the unit and asked to speak with the hospitalist on call. Once you are discharged, your primary care physician will handle any further medical issues. Please note that NO REFILLS for any discharge medications will be authorized, as it is imperative that you return to your primary care physician (or establish a relationship with a primary care physician if you do not have one) for your aftercare needs so that they can reassess your need for medications and monitor your lab values.   Do not drive, operate heavy machinery, perform activities at heights, swimming  or participation in water activities or provide baby sitting services if your were admitted for loss of consciousness/seizures  or if you are on sedating medications including, but not limited to benzodiazepines, sleep medications, narcotic pain medications, etc., until you have been cleared to do so by a medical doctor.   Do not take more than prescribed medications.   Wear a seat belt while driving.  If you have smoked or chewed Tobacco in the last 2 years please stop smoking; also stop any regular Alcohol and/or any Recreational drug use including marijuana.  If you experience worsening of your admission symptoms or develop shortness of breath, chest pain, suicidal or homicidal thoughts or experience a life threatening emergency, you must seek medical attention immediately by calling 911 or calling your PCP immediately.   Increase activity slowly   Complete by: As directed      Allergies as of 03/04/2020      Reactions   Cephalexin Other (See Comments)   Unknown   Ketorolac Hives   Prednisone Swelling   Tramadol Hives      Medication List    TAKE these medications   amoxicillin-clavulanate 875-125 MG tablet Commonly known as: Augmentin Take 1 tablet by mouth 2 (two) times daily for 5 days.   gabapentin 800 MG tablet Commonly known as: NEURONTIN Take 800 mg by mouth 3 (three) times daily.   gabapentin 100 MG capsule Commonly known as: NEURONTIN Take 2 capsules (200 mg total) by mouth 3 (three) times daily for 14 days.   levETIRAcetam 500 MG tablet Commonly known as: KEPPRA Take 1 tablet (500 mg total) by mouth 2 (two) times daily for 30 days.   naproxen sodium 220 MG tablet Commonly known as: ALEVE Take 220 mg by mouth daily as needed (For pain).   oxyCODONE-acetaminophen 7.5-325 MG tablet Commonly known as: PERCOCET Take 1 tablet by mouth every 6 (six) hours as needed for up to 3 days for moderate pain.   pantoprazole 40 MG tablet Commonly known as: PROTONIX Take  1 tablet (40 mg total) by mouth daily.       Allergies  Allergen Reactions  . Cephalexin Other (See Comments)    Unknown  . Ketorolac Hives  . Prednisone Swelling  . Tramadol Hives    Time coordinating discharge: Over 30 minutes   SIGNED:   Jae DireJared E Takahiro Godinho, D.O. [facility] Pager: [phone]  03/04/2020, 11:50 AM

## 2020-03-04 NOTE — Progress Notes (Signed)
IV discontinued at this time. Discharge instructions reviewed with patient. All questions answered.

## 2020-03-04 NOTE — Progress Notes (Signed)
Plan of care reviewed. Pt's progressing. Neuro intact, alert and oriented x 4. Ambulated independently in her room. She's hemodynamically stable. Remained afebrile.   Complained left lower quadrant abdominal pain, pain med Dilaudid and Percocet given PRN, able to tolerated well. Pt stated she had 3 times of watery stool today.  Continue to treat with ATB Ciprofloxacin and Metronidazole IV q 12 hrs.   No immediate distress noted tonight.  Will continue to monitor.  Filiberto Pinks, RN

## 2020-03-04 NOTE — Plan of Care (Signed)
Discharge to home °

## 2020-03-05 ENCOUNTER — Encounter: Payer: Self-pay | Admitting: Gastroenterology

## 2020-03-11 ENCOUNTER — Emergency Department (HOSPITAL_COMMUNITY)
Admission: EM | Admit: 2020-03-11 | Discharge: 2020-03-12 | Discharge status: Against Medical Advice | Payer: Medicaid Other | Attending: Emergency Medicine | Admitting: Emergency Medicine

## 2020-03-11 ENCOUNTER — Other Ambulatory Visit: Payer: Self-pay

## 2020-03-11 ENCOUNTER — Encounter (HOSPITAL_COMMUNITY): Payer: Self-pay | Admitting: Emergency Medicine

## 2020-03-11 DIAGNOSIS — Z5321 Procedure and treatment not carried out due to patient leaving prior to being seen by health care provider: Secondary | ICD-10-CM | POA: Insufficient documentation

## 2020-03-11 DIAGNOSIS — R109 Unspecified abdominal pain: Secondary | ICD-10-CM | POA: Insufficient documentation

## 2020-03-11 LAB — COMPREHENSIVE METABOLIC PANEL
ALT: 37 U/L (ref 0–44)
AST: 56 U/L — ABNORMAL HIGH (ref 15–41)
Albumin: 4 g/dL (ref 3.5–5.0)
Alkaline Phosphatase: 68 U/L (ref 38–126)
Anion gap: 11 (ref 5–15)
BUN: 6 mg/dL — ABNORMAL LOW (ref 8–23)
CO2: 22 mmol/L (ref 22–32)
Calcium: 9.6 mg/dL (ref 8.9–10.3)
Chloride: 105 mmol/L (ref 98–111)
Creatinine, Ser: 0.61 mg/dL (ref 0.44–1.00)
GFR calc Af Amer: >60 mL/min (ref >60)
GFR calc non Af Amer: >60 mL/min (ref >60)
Glucose, Bld: 90 mg/dL (ref 70–99)
Potassium: 3.7 mmol/L (ref 3.5–5.1)
Sodium: 138 mmol/L (ref 135–145)
Total Bilirubin: 0.4 mg/dL (ref 0.3–1.2)
Total Protein: 6.9 g/dL (ref 6.5–8.1)

## 2020-03-11 LAB — CBC
HCT: 40 % (ref 36.0–46.0)
Hemoglobin: 13 g/dL (ref 12.0–15.0)
MCH: 32.3 pg (ref 26.0–34.0)
MCHC: 32.5 g/dL (ref 30.0–36.0)
MCV: 99.5 fL (ref 80.0–100.0)
Platelets: 422 10*3/uL — ABNORMAL HIGH (ref 150–400)
RBC: 4.02 MIL/uL (ref 3.87–5.11)
RDW: 13.5 % (ref 11.5–15.5)
WBC: 6.2 10*3/uL (ref 4.0–10.5)
nRBC: 0 % (ref 0.0–0.2)

## 2020-03-11 LAB — URINALYSIS, ROUTINE W REFLEX MICROSCOPIC
Bilirubin Urine: NEGATIVE
Glucose, UA: NEGATIVE mg/dL
Hgb urine dipstick: NEGATIVE
Ketones, ur: NEGATIVE mg/dL
Leukocytes,Ua: NEGATIVE
Nitrite: POSITIVE — AB
Protein, ur: NEGATIVE mg/dL
Specific Gravity, Urine: <1.005 — ABNORMAL LOW (ref 1.005–1.030)
pH: 5.5 (ref 5.0–8.0)

## 2020-03-11 LAB — URINALYSIS, MICROSCOPIC (REFLEX)
Bacteria, UA: NONE SEEN
RBC / HPF: NONE SEEN RBC/hpf (ref 0–5)
WBC, UA: NONE SEEN WBC/hpf (ref 0–5)

## 2020-03-11 LAB — LIPASE, BLOOD: Lipase: 32 U/L (ref 11–51)

## 2020-03-11 MED ORDER — SODIUM CHLORIDE 0.9% FLUSH: 3.0000 mL | Freq: Once | INTRAVENOUS | Status: DC

## 2020-03-11 NOTE — ED Notes (Signed)
Patient stated that she was going home,asn asked for a taxi to be called.

## 2020-03-11 NOTE — ED Triage Notes (Signed)
Pt has ETOH, called EMS X2, from hotel.  2nd time states she has abdominal pain ever since being discharged 6 days ago from hospital.  140/96 88Pulse 18RR 98%RA 97.8 temp 95CBG

## 2020-03-12 NOTE — ED Notes (Signed)
Pt did not answer for vitals.  

## 2020-03-23 ENCOUNTER — Emergency Department (HOSPITAL_COMMUNITY)
Admission: EM | Admit: 2020-03-23 | Discharge: 2020-03-23 | Disposition: A | Discharge status: Home / Self Care | Payer: Medicaid Other | Attending: Emergency Medicine | Admitting: Emergency Medicine

## 2020-03-23 ENCOUNTER — Other Ambulatory Visit: Payer: Self-pay

## 2020-03-23 ENCOUNTER — Encounter (HOSPITAL_COMMUNITY): Payer: Self-pay | Admitting: Emergency Medicine

## 2020-03-23 DIAGNOSIS — Z5321 Procedure and treatment not carried out due to patient leaving prior to being seen by health care provider: Secondary | ICD-10-CM | POA: Insufficient documentation

## 2020-03-23 DIAGNOSIS — R1032 Left lower quadrant pain: Secondary | ICD-10-CM | POA: Insufficient documentation

## 2020-03-23 LAB — CBC
HCT: 41 % (ref 36.0–46.0)
Hemoglobin: 13.8 g/dL (ref 12.0–15.0)
MCH: 32.5 pg (ref 26.0–34.0)
MCHC: 33.7 g/dL (ref 30.0–36.0)
MCV: 96.5 fL (ref 80.0–100.0)
Platelets: 366 10*3/uL (ref 150–400)
RBC: 4.25 MIL/uL (ref 3.87–5.11)
RDW: 13.2 % (ref 11.5–15.5)
WBC: 5.7 10*3/uL (ref 4.0–10.5)
nRBC: 0 % (ref 0.0–0.2)

## 2020-03-23 LAB — URINALYSIS, ROUTINE W REFLEX MICROSCOPIC
Bilirubin Urine: NEGATIVE
Glucose, UA: NEGATIVE mg/dL
Hgb urine dipstick: NEGATIVE
Ketones, ur: NEGATIVE mg/dL
Leukocytes,Ua: NEGATIVE
Nitrite: NEGATIVE
Protein, ur: NEGATIVE mg/dL
Specific Gravity, Urine: 1.002 — ABNORMAL LOW (ref 1.005–1.030)
pH: 5 (ref 5.0–8.0)

## 2020-03-23 LAB — COMPREHENSIVE METABOLIC PANEL
ALT: 62 U/L — ABNORMAL HIGH (ref 0–44)
AST: 57 U/L — ABNORMAL HIGH (ref 15–41)
Albumin: 4 g/dL (ref 3.5–5.0)
Alkaline Phosphatase: 120 U/L (ref 38–126)
Anion gap: 11 (ref 5–15)
BUN: 5 mg/dL — ABNORMAL LOW (ref 8–23)
CO2: 22 mmol/L (ref 22–32)
Calcium: 9.3 mg/dL (ref 8.9–10.3)
Chloride: 104 mmol/L (ref 98–111)
Creatinine, Ser: 0.55 mg/dL (ref 0.44–1.00)
GFR calc Af Amer: >60 mL/min (ref >60)
GFR calc non Af Amer: >60 mL/min (ref >60)
Glucose, Bld: 88 mg/dL (ref 70–99)
Potassium: 3.9 mmol/L (ref 3.5–5.1)
Sodium: 137 mmol/L (ref 135–145)
Total Bilirubin: 0.4 mg/dL (ref 0.3–1.2)
Total Protein: 7.1 g/dL (ref 6.5–8.1)

## 2020-03-23 LAB — LIPASE, BLOOD: Lipase: 30 U/L (ref 11–51)

## 2020-03-23 MED ORDER — SODIUM CHLORIDE 0.9% FLUSH: 3.0000 mL | Freq: Once | INTRAVENOUS | Status: DC

## 2020-03-23 NOTE — ED Notes (Signed)
Pt said she will be okay she does not need to be seen by a doctor

## 2020-03-23 NOTE — ED Triage Notes (Signed)
Pt states she was just dc from the hospital for diverticulitis and her pain is not getting any better left lower quadrant pain.

## 2020-04-16 ENCOUNTER — Ambulatory Visit: Payer: Medicaid Other | Admitting: Gastroenterology

## 2020-06-05 ENCOUNTER — Other Ambulatory Visit: Payer: Self-pay

## 2020-06-05 ENCOUNTER — Emergency Department (HOSPITAL_COMMUNITY)
Admission: EM | Admit: 2020-06-05 | Discharge: 2020-06-06 | Disposition: A | Discharge status: Home / Self Care | Payer: Medicaid Other | Attending: Emergency Medicine | Admitting: Emergency Medicine

## 2020-06-05 DIAGNOSIS — S92355A Nondisplaced fracture of fifth metatarsal bone, left foot, initial encounter for closed fracture: Secondary | ICD-10-CM | POA: Diagnosis not present

## 2020-06-05 DIAGNOSIS — Z590 Homelessness: Secondary | ICD-10-CM | POA: Diagnosis not present

## 2020-06-05 DIAGNOSIS — W19XXXA Unspecified fall, initial encounter: Secondary | ICD-10-CM | POA: Insufficient documentation

## 2020-06-05 DIAGNOSIS — Y939 Activity, unspecified: Secondary | ICD-10-CM | POA: Insufficient documentation

## 2020-06-05 DIAGNOSIS — F17210 Nicotine dependence, cigarettes, uncomplicated: Secondary | ICD-10-CM | POA: Diagnosis not present

## 2020-06-05 DIAGNOSIS — Y929 Unspecified place or not applicable: Secondary | ICD-10-CM | POA: Insufficient documentation

## 2020-06-05 DIAGNOSIS — Y999 Unspecified external cause status: Secondary | ICD-10-CM | POA: Diagnosis not present

## 2020-06-05 DIAGNOSIS — F1010 Alcohol abuse, uncomplicated: Secondary | ICD-10-CM

## 2020-06-05 DIAGNOSIS — S99922A Unspecified injury of left foot, initial encounter: Secondary | ICD-10-CM | POA: Diagnosis present

## 2020-06-05 DIAGNOSIS — R109 Unspecified abdominal pain: Secondary | ICD-10-CM | POA: Diagnosis not present

## 2020-06-05 DIAGNOSIS — S40012A Contusion of left shoulder, initial encounter: Secondary | ICD-10-CM

## 2020-06-05 LAB — COMPREHENSIVE METABOLIC PANEL
ALT: 10 U/L (ref 0–44)
AST: 24 U/L (ref 15–41)
Albumin: 3.5 g/dL (ref 3.5–5.0)
Alkaline Phosphatase: 79 U/L (ref 38–126)
Anion gap: 12 (ref 5–15)
BUN: <5 mg/dL — ABNORMAL LOW (ref 8–23)
CO2: 23 mmol/L (ref 22–32)
Calcium: 9.1 mg/dL (ref 8.9–10.3)
Chloride: 96 mmol/L — ABNORMAL LOW (ref 98–111)
Creatinine, Ser: 0.71 mg/dL (ref 0.44–1.00)
GFR calc Af Amer: >60 mL/min (ref >60)
GFR calc non Af Amer: >60 mL/min (ref >60)
Glucose, Bld: 98 mg/dL (ref 70–99)
Potassium: 3.1 mmol/L — ABNORMAL LOW (ref 3.5–5.1)
Sodium: 131 mmol/L — ABNORMAL LOW (ref 135–145)
Total Bilirubin: 0.6 mg/dL (ref 0.3–1.2)
Total Protein: 6.8 g/dL (ref 6.5–8.1)

## 2020-06-05 LAB — URINALYSIS, ROUTINE W REFLEX MICROSCOPIC
Bilirubin Urine: NEGATIVE
Glucose, UA: NEGATIVE mg/dL
Hgb urine dipstick: NEGATIVE
Ketones, ur: NEGATIVE mg/dL
Leukocytes,Ua: NEGATIVE
Nitrite: NEGATIVE
Protein, ur: NEGATIVE mg/dL
Specific Gravity, Urine: 1.002 — ABNORMAL LOW (ref 1.005–1.030)
pH: 6 (ref 5.0–8.0)

## 2020-06-05 LAB — CBC
HCT: 44.5 % (ref 36.0–46.0)
Hemoglobin: 14.6 g/dL (ref 12.0–15.0)
MCH: 32.8 pg (ref 26.0–34.0)
MCHC: 32.8 g/dL (ref 30.0–36.0)
MCV: 100 fL (ref 80.0–100.0)
Platelets: 283 10*3/uL (ref 150–400)
RBC: 4.45 MIL/uL (ref 3.87–5.11)
RDW: 13.4 % (ref 11.5–15.5)
WBC: 5.5 10*3/uL (ref 4.0–10.5)
nRBC: 0 % (ref 0.0–0.2)

## 2020-06-05 LAB — CBG MONITORING, ED: Glucose-Capillary: 94 mg/dL (ref 70–99)

## 2020-06-05 LAB — LIPASE, BLOOD: Lipase: 34 U/L (ref 11–51)

## 2020-06-05 NOTE — ED Triage Notes (Signed)
Pt reports flare up of diverticulitis. EMS was called for seizure while walking to the bus stop. Seizure was not witnessed and pt was totally A/O x 4 on FD arrival. Also reports alcohol problem and being depressed. Has had a twisted tea today.

## 2020-06-06 ENCOUNTER — Emergency Department (HOSPITAL_COMMUNITY): Payer: Medicaid Other

## 2020-06-06 MED ORDER — GABAPENTIN 300 MG PO CAPS
300.0000 mg | ORAL_CAPSULE | Freq: Three times a day (TID) | ORAL | 0 refills | Status: DC
Start: 2020-06-06 — End: 2020-06-06

## 2020-06-06 MED ORDER — PANTOPRAZOLE SODIUM 40 MG PO TBEC
40.0000 mg | DELAYED_RELEASE_TABLET | Freq: Every day | ORAL | 2 refills | Status: DC
Start: 2020-06-06 — End: 2020-06-09

## 2020-06-06 MED ORDER — OXYCODONE-ACETAMINOPHEN 5-325 MG PO TABS
1.0000 | ORAL_TABLET | Freq: Once | ORAL | Status: AC
  Administered 2020-06-06: 1 via ORAL
  Filled 2020-06-06: qty 1

## 2020-06-06 MED ORDER — GABAPENTIN 300 MG PO CAPS
300.0000 mg | ORAL_CAPSULE | Freq: Three times a day (TID) | ORAL | 0 refills | Status: DC
Start: 2020-06-06 — End: 2020-09-02

## 2020-06-06 MED ORDER — HYDROCODONE-ACETAMINOPHEN 5-325 MG PO TABS: 1.0000 | ORAL_TABLET | Freq: Four times a day (QID) | ORAL | 0 refills | Status: DC | PRN

## 2020-06-06 MED ORDER — ONDANSETRON 4 MG PO TBDP
4.0000 mg | ORAL_TABLET | Freq: Once | ORAL | Status: AC
  Administered 2020-06-06: 4 mg via ORAL
  Filled 2020-06-06: qty 1

## 2020-06-06 NOTE — ED Notes (Signed)
Got patient into a gown on the monitor patient is resting with call bell in reach 

## 2020-06-08 ENCOUNTER — Ambulatory Visit (HOSPITAL_COMMUNITY)
Admission: EM | Admit: 2020-06-08 | Discharge: 2020-06-09 | Disposition: A | Discharge status: Other Acute Inpatient Hospital | Payer: Medicaid Other | Attending: Behavioral Health | Admitting: Behavioral Health

## 2020-06-08 ENCOUNTER — Other Ambulatory Visit: Payer: Self-pay

## 2020-06-08 DIAGNOSIS — Z20822 Contact with and (suspected) exposure to covid-19: Secondary | ICD-10-CM | POA: Insufficient documentation

## 2020-06-08 DIAGNOSIS — F17210 Nicotine dependence, cigarettes, uncomplicated: Secondary | ICD-10-CM | POA: Insufficient documentation

## 2020-06-08 DIAGNOSIS — G8929 Other chronic pain: Secondary | ICD-10-CM | POA: Insufficient documentation

## 2020-06-08 DIAGNOSIS — I444 Left anterior fascicular block: Secondary | ICD-10-CM | POA: Insufficient documentation

## 2020-06-08 DIAGNOSIS — F332 Major depressive disorder, 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 psychotic features: Secondary | ICD-10-CM | POA: Insufficient documentation

## 2020-06-08 DIAGNOSIS — F1020 Alcohol dependence, uncomplicated: Secondary | ICD-10-CM | POA: Insufficient documentation

## 2020-06-08 DIAGNOSIS — F322 Major depressive disorder, single episode, severe without psychotic features: Secondary | ICD-10-CM

## 2020-06-08 DIAGNOSIS — F419 Anxiety disorder, unspecified: Secondary | ICD-10-CM | POA: Insufficient documentation

## 2020-06-08 DIAGNOSIS — R45851 Suicidal ideations: Secondary | ICD-10-CM | POA: Insufficient documentation

## 2020-06-09 ENCOUNTER — Other Ambulatory Visit: Payer: Self-pay

## 2020-06-09 ENCOUNTER — Emergency Department (HOSPITAL_COMMUNITY)
Admission: EM | Admit: 2020-06-09 | Discharge: 2020-06-09 | Disposition: A | Discharge status: Home / Self Care | Payer: Medicaid Other | Attending: Emergency Medicine | Admitting: Emergency Medicine

## 2020-06-09 ENCOUNTER — Ambulatory Visit (HOSPITAL_COMMUNITY)
Admission: EM | Admit: 2020-06-09 | Discharge: 2020-06-10 | Disposition: A | Discharge status: Home / Self Care | Payer: Medicaid Other | Source: Home / Self Care

## 2020-06-09 ENCOUNTER — Encounter (HOSPITAL_COMMUNITY): Payer: Self-pay | Admitting: Emergency Medicine

## 2020-06-09 DIAGNOSIS — G8929 Other chronic pain: Secondary | ICD-10-CM | POA: Insufficient documentation

## 2020-06-09 DIAGNOSIS — F419 Anxiety disorder, unspecified: Secondary | ICD-10-CM | POA: Diagnosis not present

## 2020-06-09 DIAGNOSIS — R45851 Suicidal ideations: Secondary | ICD-10-CM | POA: Insufficient documentation

## 2020-06-09 DIAGNOSIS — F1010 Alcohol abuse, uncomplicated: Secondary | ICD-10-CM | POA: Insufficient documentation

## 2020-06-09 DIAGNOSIS — Z590 Homelessness: Secondary | ICD-10-CM | POA: Insufficient documentation

## 2020-06-09 DIAGNOSIS — R569 Unspecified convulsions: Secondary | ICD-10-CM | POA: Insufficient documentation

## 2020-06-09 DIAGNOSIS — F1024 Alcohol dependence with alcohol-induced mood disorder: Secondary | ICD-10-CM

## 2020-06-09 DIAGNOSIS — R109 Unspecified abdominal pain: Secondary | ICD-10-CM | POA: Insufficient documentation

## 2020-06-09 DIAGNOSIS — M2550 Pain in unspecified joint: Secondary | ICD-10-CM | POA: Diagnosis not present

## 2020-06-09 DIAGNOSIS — Z79899 Other long term (current) drug therapy: Secondary | ICD-10-CM | POA: Diagnosis not present

## 2020-06-09 DIAGNOSIS — E876 Hypokalemia: Secondary | ICD-10-CM | POA: Insufficient documentation

## 2020-06-09 DIAGNOSIS — F332 Major depressive disorder, 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 psychotic features: Secondary | ICD-10-CM

## 2020-06-09 DIAGNOSIS — F329 Major depressive disorder, single episode, unspecified: Secondary | ICD-10-CM | POA: Diagnosis present

## 2020-06-09 DIAGNOSIS — F17210 Nicotine dependence, cigarettes, uncomplicated: Secondary | ICD-10-CM | POA: Insufficient documentation

## 2020-06-09 DIAGNOSIS — K5792 Diverticulitis of intestine, part unspecified, without perforation or abscess without bleeding: Secondary | ICD-10-CM | POA: Insufficient documentation

## 2020-06-09 DIAGNOSIS — Z20822 Contact with and (suspected) exposure to covid-19: Secondary | ICD-10-CM | POA: Diagnosis not present

## 2020-06-09 DIAGNOSIS — K859 Acute pancreatitis without necrosis or infection, unspecified: Secondary | ICD-10-CM | POA: Insufficient documentation

## 2020-06-09 LAB — CBC
HCT: 37.1 % (ref 36.0–46.0)
Hemoglobin: 12.2 g/dL (ref 12.0–15.0)
MCH: 33.2 pg (ref 26.0–34.0)
MCHC: 32.9 g/dL (ref 30.0–36.0)
MCV: 100.8 fL — ABNORMAL HIGH (ref 80.0–100.0)
Platelets: 311 10*3/uL (ref 150–400)
RBC: 3.68 MIL/uL — ABNORMAL LOW (ref 3.87–5.11)
RDW: 13.3 % (ref 11.5–15.5)
WBC: 4.2 10*3/uL (ref 4.0–10.5)
nRBC: 0 % (ref 0.0–0.2)

## 2020-06-09 LAB — COMPREHENSIVE METABOLIC PANEL
ALT: 11 U/L (ref 0–44)
AST: 24 U/L (ref 15–41)
Albumin: 3.2 g/dL — ABNORMAL LOW (ref 3.5–5.0)
Alkaline Phosphatase: 71 U/L (ref 38–126)
Anion gap: 15 (ref 5–15)
BUN: 9 mg/dL (ref 8–23)
CO2: 21 mmol/L — ABNORMAL LOW (ref 22–32)
Calcium: 9 mg/dL (ref 8.9–10.3)
Chloride: 94 mmol/L — ABNORMAL LOW (ref 98–111)
Creatinine, Ser: 0.93 mg/dL (ref 0.44–1.00)
GFR calc Af Amer: >60 mL/min (ref >60)
GFR calc non Af Amer: >60 mL/min (ref >60)
Glucose, Bld: 122 mg/dL — ABNORMAL HIGH (ref 70–99)
Potassium: 2.6 mmol/L — CL (ref 3.5–5.1)
Sodium: 130 mmol/L — ABNORMAL LOW (ref 135–145)
Total Bilirubin: 0.6 mg/dL (ref 0.3–1.2)
Total Protein: 6.3 g/dL — ABNORMAL LOW (ref 6.5–8.1)

## 2020-06-09 LAB — POCT URINE DRUG SCREEN - MANUAL ENTRY (I-SCREEN)
POC Amphetamine UR: NOT DETECTED
POC Buprenorphine (BUP): NOT DETECTED
POC Cocaine UR: NOT DETECTED
POC Marijuana UR: NOT DETECTED
POC Methadone UR: NOT DETECTED
POC Methamphetamine UR: NOT DETECTED
POC Morphine: POSITIVE — AB
POC Oxazepam (BZO): NOT DETECTED
POC Oxycodone UR: NOT DETECTED
POC Secobarbital (BAR): NOT DETECTED

## 2020-06-09 LAB — BASIC METABOLIC PANEL
Anion gap: 9 (ref 5–15)
BUN: 8 mg/dL (ref 8–23)
CO2: 27 mmol/L (ref 22–32)
Calcium: 8.9 mg/dL (ref 8.9–10.3)
Chloride: 100 mmol/L (ref 98–111)
Creatinine, Ser: 0.77 mg/dL (ref 0.44–1.00)
GFR calc Af Amer: >60 mL/min (ref >60)
GFR calc non Af Amer: >60 mL/min (ref >60)
Glucose, Bld: 104 mg/dL — ABNORMAL HIGH (ref 70–99)
Potassium: 3.2 mmol/L — ABNORMAL LOW (ref 3.5–5.1)
Sodium: 136 mmol/L (ref 135–145)

## 2020-06-09 LAB — CBC WITH DIFFERENTIAL/PLATELET
Abs Immature Granulocytes: 0.03 10*3/uL (ref 0.00–0.07)
Basophils Absolute: 0 10*3/uL (ref 0.0–0.1)
Basophils Relative: 0 %
Eosinophils Absolute: 0.1 10*3/uL (ref 0.0–0.5)
Eosinophils Relative: 1 %
HCT: 37.7 % (ref 36.0–46.0)
Hemoglobin: 12.2 g/dL (ref 12.0–15.0)
Immature Granulocytes: 0 %
Lymphocytes Relative: 25 %
Lymphs Abs: 1.7 10*3/uL (ref 0.7–4.0)
MCH: 33.5 pg (ref 26.0–34.0)
MCHC: 32.4 g/dL (ref 30.0–36.0)
MCV: 103.6 fL — ABNORMAL HIGH (ref 80.0–100.0)
Monocytes Absolute: 0.5 10*3/uL (ref 0.1–1.0)
Monocytes Relative: 7 %
Neutro Abs: 4.5 10*3/uL (ref 1.7–7.7)
Neutrophils Relative %: 67 %
Platelets: 302 10*3/uL (ref 150–400)
RBC: 3.64 MIL/uL — ABNORMAL LOW (ref 3.87–5.11)
RDW: 13.3 % (ref 11.5–15.5)
WBC: 6.9 10*3/uL (ref 4.0–10.5)
nRBC: 0 % (ref 0.0–0.2)

## 2020-06-09 LAB — HEMOGLOBIN A1C
Hgb A1c MFr Bld: 5.3 % (ref 4.8–5.6)
Mean Plasma Glucose: 105.41 mg/dL

## 2020-06-09 LAB — SARS CORONAVIRUS 2 BY RT PCR (HOSPITAL ORDER, PERFORMED IN ~~LOC~~ HOSPITAL LAB): SARS Coronavirus 2: NEGATIVE

## 2020-06-09 LAB — MAGNESIUM
Magnesium: 1.6 mg/dL — ABNORMAL LOW (ref 1.7–2.4)
Magnesium: 1.8 mg/dL (ref 1.7–2.4)

## 2020-06-09 LAB — LIPID PANEL
Cholesterol: 148 mg/dL (ref 0–200)
HDL: 74 mg/dL (ref >40)
LDL Cholesterol: 58 mg/dL (ref 0–99)
Total CHOL/HDL Ratio: 2 RATIO
Triglycerides: 79 mg/dL (ref <150)
VLDL: 16 mg/dL (ref 0–40)

## 2020-06-09 LAB — POC SARS CORONAVIRUS 2 AG: SARS Coronavirus 2 Ag: NEGATIVE

## 2020-06-09 LAB — BILIRUBIN, DIRECT: Bilirubin, Direct: 0.1 mg/dL (ref 0.0–0.2)

## 2020-06-09 LAB — POC SARS CORONAVIRUS 2 AG -  ED: SARS Coronavirus 2 Ag: NEGATIVE

## 2020-06-09 LAB — TSH: TSH: 2.625 u[IU]/mL (ref 0.350–4.500)

## 2020-06-09 LAB — ETHANOL: Alcohol, Ethyl (B): <10 mg/dL (ref <10)

## 2020-06-09 MED ORDER — POTASSIUM CHLORIDE 10 MEQ/100ML IV SOLN
10.0000 meq | INTRAVENOUS | Status: AC
  Administered 2020-06-09 (×2): 10 meq via INTRAVENOUS
  Filled 2020-06-09 (×2): qty 100

## 2020-06-09 MED ORDER — IBUPROFEN 200 MG PO TABS
600.0000 mg | ORAL_TABLET | Freq: Once | ORAL | Status: AC
  Administered 2020-06-09: 600 mg via ORAL
  Filled 2020-06-09: qty 3

## 2020-06-09 MED ORDER — TRAZODONE HCL 50 MG PO TABS: 50.0000 mg | ORAL_TABLET | Freq: Every evening | ORAL | Status: DC | PRN

## 2020-06-09 MED ORDER — HYDROCODONE-ACETAMINOPHEN 5-325 MG PO TABS
1.0000 | ORAL_TABLET | Freq: Once | ORAL | Status: AC
  Administered 2020-06-09: 1 via ORAL
  Filled 2020-06-09: qty 1

## 2020-06-09 MED ORDER — MAGNESIUM HYDROXIDE 400 MG/5ML PO SUSP: 30.0000 mL | Freq: Every day | ORAL | Status: DC | PRN

## 2020-06-09 MED ORDER — ALUM & MAG HYDROXIDE-SIMETH 200-200-20 MG/5ML PO SUSP: 30.0000 mL | ORAL | Status: DC | PRN

## 2020-06-09 MED ORDER — MAGNESIUM SULFATE 2 GM/50ML IV SOLN
2.0000 g | Freq: Once | INTRAVENOUS | Status: AC
  Administered 2020-06-09: 2 g via INTRAVENOUS
  Filled 2020-06-09: qty 50

## 2020-06-09 MED ORDER — SODIUM CHLORIDE 0.9 % IV BOLUS
500.0000 mL | Freq: Once | INTRAVENOUS | Status: AC
  Administered 2020-06-09: 500 mL via INTRAVENOUS

## 2020-06-09 MED ORDER — GABAPENTIN 400 MG PO CAPS: 800.0000 mg | ORAL_CAPSULE | Freq: Three times a day (TID) | ORAL | Status: DC

## 2020-06-09 MED ORDER — ACETAMINOPHEN 325 MG PO TABS: 650.0000 mg | ORAL_TABLET | Freq: Four times a day (QID) | ORAL | Status: DC | PRN

## 2020-06-09 MED ORDER — ACETAMINOPHEN 325 MG PO TABS
650.0000 mg | ORAL_TABLET | Freq: Four times a day (QID) | ORAL | Status: DC | PRN
  Administered 2020-06-09 – 2020-06-10 (×2): 650 mg via ORAL
  Filled 2020-06-09 (×3): qty 2

## 2020-06-09 MED ORDER — MELATONIN 5 MG PO TABS: 5.0000 mg | ORAL_TABLET | Freq: Every evening | ORAL | Status: DC | PRN

## 2020-06-09 MED ORDER — HYDROXYZINE HCL 25 MG PO TABS
25.0000 mg | ORAL_TABLET | Freq: Once | ORAL | Status: AC
  Administered 2020-06-09: 25 mg via ORAL
  Filled 2020-06-09: qty 1

## 2020-06-09 MED ORDER — POTASSIUM CHLORIDE CRYS ER 20 MEQ PO TBCR
40.0000 meq | EXTENDED_RELEASE_TABLET | Freq: Once | ORAL | Status: AC
  Administered 2020-06-09: 40 meq via ORAL
  Filled 2020-06-09: qty 2

## 2020-06-09 NOTE — ED Notes (Signed)
A&O x4. Cooperative and calm. Wanting to make sure she gets help. States "if she doesn't get help, it'll be the end of me." oriented to staff and unit. Will continue to monitor for safety. In restroom when provider came to see pt. Provider stated would come back when pt is out

## 2020-06-09 NOTE — ED Notes (Signed)
Pt requesting additional pain medication. PA aware.

## 2020-06-09 NOTE — ED Triage Notes (Signed)
Pt states that she was sent over due to her low potassium that was drawn at [facility].
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
## 2020-06-09 NOTE — ED Triage Notes (Signed)
Pt presents with SI, depression, requesting rehab.

## 2020-06-09 NOTE — ED Notes (Signed)
CHARGE NURSE JENEEN AND CHARGE NURSE SUSAN NOTIFIED, PT BEING TRANSFERRED BY [facility] FOR EVALUATION FOR CRITICAL POTASSIUM 2.6.

## 2020-06-09 NOTE — ED Notes (Signed)
Attempted IV times 2. Unsuccessful  

## 2020-06-09 NOTE — ED Notes (Addendum)
Patient given water and coffee.

## 2020-06-09 NOTE — ED Provider Notes (Signed)
[facility]-EMERGENCY DEPT Provider Note   CSN: [id number] Arrival date & time: 06/09/20  9024     History Chief Complaint  Patient presents with  . Abnormal Lab    [name] is a 64 y.o. female.  [name] is a 64 y.o. female with a history of depression, anxiety, alcohol dependence, diverticulitis, pancreatitis, who presents to the emergency department from [facility] for evaluation of low potassium and magnesium.  She presented to the behavioral [facility] overnight expressing depression and anxiety with suicidal ideations, they evaluated her and plan to continue to observe her overnight and reevaluate in the morning, they checked basic lab work and patient was found to have a potassium of 2.6 and a magnesium of 1.6.  She was previously having diarrhea, but she states this has been improving.  When she was seen in the hospital on 8/18 she had a potassium 3.1.  She states she has had some fatigue, denies any diarrhea or blood in the stool today, no vomiting.  Does not appear that she takes any diuretics.  States she was having some abdominal pain but this is been overall improving, no fevers or chills.  No other aggravating or alleviating factors.  She continues to endorse anxiety, depression and suicidal ideations.        Past Medical History:  Diagnosis Date  . Alcohol dependence (HCC)   . Anxiety   . Chronic pain   . Depression   . Diverticulitis   . Herniated cervical disc   . Pancreatitis   . Seizures (HCC)    alcoholic seizures    Patient Active Problem List   Diagnosis Date Noted  . Diverticulitis 02/29/2020  . Acute diverticulitis 02/28/2020  . GERD (gastroesophageal reflux disease) 02/28/2020  . Suicidal ideation   . Delirium tremens (HCC) 12/13/2018  . Thrombocytopenia (HCC) 12/13/2018  . Homeless 12/13/2018  . Depression 12/13/2018  . Neuropathy 12/13/2018  . Subarachnoid hemorrhage (HCC) 12/11/2018  .  Seizure due to alcohol withdrawal (HCC) 12/11/2018  . Alcohol dependence with alcohol-induced mood disorder (HCC)   . 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 Major depressive disorder, 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    Past Surgical History:  Procedure Laterality Date  . ABDOMINAL HYSTERECTOMY    . ANKLE SURGERY Right   . CARPAL TUNNEL RELEASE Bilateral   . CERVICAL FUSION    . CHOLECYSTECTOMY    . KNEE SURGERY       OB History   No obstetric history on file.     No family history on file.  Social History   Tobacco Use  . Smoking status: Current Every Day Smoker    Packs/day: 0.50    Years: 7.00    Pack years: 3.50    Types: Cigarettes  . Smokeless tobacco: Never Used  Vaping Use  . Vaping Use: Never used  Substance Use Topics  . Alcohol use: Yes    Comment: drinks a fifth of vodka daily  . Drug use: Not Currently    Types: Marijuana    Home Medications Prior to Admission medications   Medication Sig Start Date End Date Taking? Authorizing Provider  gabapentin (NEURONTIN) 300 MG capsule Take 1 capsule (300 mg total) by mouth 3 (three) times daily. 06/06/20  Yes Raeford Razor, MD  pantoprazole (PROTONIX) 40 MG tablet Take 1 tablet (40 mg total) by mouth daily. 08/04/19  Yes Lorelee New, PA-C  HYDROcodone-acetaminophen (NORCO/VICODIN)  5-325 MG tablet Take 1 tablet by mouth every 6 (six) hours as needed. Patient not taking: Reported on 06/09/2020 06/06/20   Raeford Razor, MD  metoCLOPramide (REGLAN) 10 MG tablet Take 1 tablet (10 mg total) by mouth every 6 (six) hours as needed for nausea (nausea/headache). 04/06/19 05/24/19  Mesner, Barbara Cower, MD    Allergies    Cephalexin, Ketorolac, Prednisone, and Tramadol  Review of Systems   Review of Systems  Constitutional: Positive for fatigue. Negative for chills and fever.  HENT: Negative.   Respiratory: Negative for cough and shortness of  breath.   Cardiovascular: Negative for chest pain.  Gastrointestinal: Positive for abdominal pain and diarrhea. Negative for nausea and vomiting.  Genitourinary: Negative for dysuria and frequency.  Musculoskeletal: Positive for arthralgias (Recent foot fracture). Negative for myalgias.  Skin: Negative for color change and rash.  Neurological: Negative for dizziness, syncope and light-headedness.  Psychiatric/Behavioral: Positive for dysphoric mood and suicidal ideas. The patient is nervous/anxious.     Physical Exam Updated Vital Signs BP (!) 143/88   Pulse 93   Temp 97.6 F (36.4 C) (Oral)   Resp 18   SpO2 100%   Physical Exam Vitals and nursing note reviewed.  Constitutional:      General: She is not in acute distress.    Appearance: Normal appearance. She is well-developed. She is not ill-appearing or diaphoretic.  HENT:     Head: Normocephalic and atraumatic.  Eyes:     General:        Right eye: No discharge.        Left eye: No discharge.     Pupils: Pupils are equal, round, and reactive to light.  Cardiovascular:     Rate and Rhythm: Normal rate and regular rhythm.     Heart sounds: Normal heart sounds.  Pulmonary:     Effort: Pulmonary effort is normal. No respiratory distress.     Breath sounds: Normal breath sounds. No wheezing or rales.     Comments: Respirations equal and unlabored, patient able to speak in full sentences, lungs clear to auscultation bilaterally Abdominal:     General: Bowel sounds are normal. There is no distension.     Palpations: Abdomen is soft. There is no mass.     Tenderness: There is no abdominal tenderness. There is no guarding.     Comments: Abdomen soft, nondistended, patient endorses, but has no guarding or peritoneal signs, with distraction patient does not endorse pain  Musculoskeletal:        General: No deformity.     Cervical back: Neck supple.     Comments: Right foot with mild swelling over the fifth metatarsal, history of  recent fracture, CAM Walker boot at bedside with patient  Skin:    General: Skin is warm and dry.     Capillary Refill: Capillary refill takes less than 2 seconds.  Neurological:     Mental Status: She is alert.     Coordination: Coordination normal.     Comments: Speech is clear, able to follow commands Moves extremities without ataxia, coordination intact  Psychiatric:        Attention and Perception: She does not perceive auditory or visual hallucinations.        Mood and Affect: Mood is depressed. Affect is flat.        Speech: Speech normal.        Behavior: Behavior normal.        Thought Content: Thought content includes suicidal ideation.  Thought content does not include homicidal ideation.     ED Results / Procedures / Treatments   Labs (all labs ordered are listed, but only abnormal results are displayed) Labs Reviewed  BASIC METABOLIC PANEL - Abnormal; Notable for the following components:      Result Value   Potassium 3.2 (*)    Glucose, Bld 104 (*)    All other components within normal limits  CBC - Abnormal; Notable for the following components:   RBC 3.68 (*)    MCV 100.8 (*)    All other components within normal limits  MAGNESIUM    EKG None  Radiology No results found.  Procedures Procedures (including critical care time)  Medications Ordered in ED Medications  potassium chloride 10 mEq in 100 mL IVPB (0 mEq Intravenous Stopped 06/09/20 1355)  magnesium sulfate IVPB 2 g 50 mL (0 g Intravenous Stopped 06/09/20 1202)  potassium chloride SA (KLOR-CON) CR tablet 40 mEq (40 mEq Oral Given 06/09/20 0915)  sodium chloride 0.9 % bolus 500 mL (0 mLs Intravenous Stopped 06/09/20 1203)  HYDROcodone-acetaminophen (NORCO/VICODIN) 5-325 MG per tablet 1 tablet (1 tablet Oral Given 06/09/20 0946)  ibuprofen (ADVIL) tablet 600 mg (600 mg Oral Given 06/09/20 1159)    ED Course  I have reviewed the triage vital signs and the nursing notes.  Pertinent labs & imaging  results that were available during my care of the patient were reviewed by me and considered in my medical decision making (see chart for details).    MDM Rules/Calculators/A&P                          64 year old sent from behavioral health urgent care for evaluation of low potassium and magnesium.  She presented there overnight with worsening depression and anxiety endorsing SI.  They had planned to observe the patient and reevaluate in the morning, but with electrolyte abnormalities on screening labs she was transferred to the ED for further evaluation.  Patient is not on any diuretics, she was recently having diarrhea, and during recent ED visit had a potassium of 3.1, her diarrhea is improving now but I suspect this is the cause of her electrolyte derangements.  EKG with some nonspecific T wave abnormalities likely due to electrolyte derangements, no other acute abnormalities.  Will recheck electrolytes but in the meantime will give p.o. as well as IV potassium and IV magnesium replacement as well as small fluid bolus.  Patient complaining of pain in her foot, she was prescribed Norco for this fracture, will give dose of pain medication here in the ED while treating electrolyte derangements.  Patient continues to endorse SI.  Lab work repeated here shows a potassium of 3.2 and normal magnesium which is reassuring, no other significant electrolyte derangements, patient has received 2 rounds of IV potassium, 40 mEqs of oral potassium and 2 g of IV magnesium.  She has remained hemodynamically stable during her evaluation.  She continues to endorse suicidal ideations.  At this time she is medically cleared, I have spoken with NP Malachy Chamber at behavioral health urgent care who states that patient can be transferred back for continued observation.  Final Clinical Impression(s) / ED Diagnoses Final diagnoses:  Hypokalemia  Hypomagnesemia  Suicidal ideation    Rx / DC Orders ED Discharge Orders     None       Legrand Rams 06/09/20 1420    Raeford Razor, MD 06/11/20 1116

## 2020-06-09 NOTE — ED Notes (Signed)
Pt resting at present.  No distress noted, monitoring for safety.

## 2020-06-09 NOTE — ED Notes (Signed)
Pt arrived from [facility] after being transported there this morning because of critical lab values. Pt was anxious and worried about being discharged when she returned to [facility] Pt stated that she was confused about what was happening. RN explained why pt left for the ED this morning and reassured her that she is not being discharged from the observation unit and will be reassessed by the Provider. Education, support, and encouragement provided and pt verbally contracts for safety. Pt ambulating on the unit with no issues. Pt remains safe on the unit.

## 2020-06-09 NOTE — ED Notes (Signed)
RN assessments completed with LPN and MHT present. 

## 2020-06-09 NOTE — ED Notes (Signed)
Patient given sandwich and soda. 

## 2020-06-09 NOTE — ED Notes (Signed)
Pt resting at present, no distress noted, monitoring for safety. 

## 2020-06-09 NOTE — ED Provider Notes (Signed)
Behavioral Health Admission [facility]BC & OBS)  Date: 06/09/20 Patient Name: [name] MRN: [id number] Chief Complaint:  Chief Complaint  Patient presents with  . Suicidal  . Depression   Chief Complaint/Presenting Problem: Pt states she does not want to live anymore. She has plan to overdose on muscle relaxers and alcohol.  Diagnoses: MMD Final diagnoses:  None   HPI: [name]  64 y.o. Who [facility] health services. The patient arrived with a fracture boot to her right foot and complained of having a steel plate in her neck and shoulder. The patience she has been drinking consistently every day for an entire year. She denies using substances, and she is positive for Morphine. The patient voiced she has no one who supports her and is there for her.  The patient does not appear to be responding to internal or external stimuli. Neither is the patient presenting with any delusional thinking. The patient admits to auditory or visual hallucinations--the patient admits to Suicidal Ideation, but she denies homicidal or self-harm ideations. The patient is not presenting with any psychotic or paranoid behaviors. During an encounter with the patient, she was able to answer questions appropriately. . PHQ 2-9:    [facility]  Thoughts that you would be better off dead, or of hurting yourself in some way Nearly every day  PHQ-9 Total Score 27        [facility]) [facility] from 10/21/2018 in [facility]  C-SSRS RISK CATEGORY High Risk High Risk High Risk       Total Time spent with patient: 45 minutes  Musculoskeletal  Strength & Muscle Tone: within normal limits Gait & Station: normal Patient leans: N/A  Psychiatric Specialty Exam   Presentation General Appearance: Appropriate for Environment;Casual  Eye Contact:Fair  Speech:Clear and Coherent  Speech Volume:Normal  Handedness:Right   Mood and Affect  Mood:Anxious;Depressed;Hopeless  Affect:Appropriate;Depressed;Congruent;Labile   Thought Process  Thought Processes:Coherent;Goal Directed  Descriptions of Associations:Intact  Orientation:Full (Time, Place and Person)  Thought Content:Logical  Hallucinations:Hallucinations: None  Ideas of Reference:None  Suicidal Thoughts:Suicidal Thoughts: Yes, Active SI Active Intent and/or Plan: With Plan SI Passive Intent and/or Plan: Without Plan  Homicidal Thoughts:No data recorded  Sensorium  Memory:Remote Good  Judgment:Poor  Insight:Lacking   Executive Functions  Concentration:Poor  Attention Span:Good  Recall:Other (comment)  Fund of Knowledge:Good  Language:Good   Psychomotor Activity  Psychomotor Activity:Psychomotor Activity: Normal   Assets  Assets:Communication Skills   Sleep  Sleep:Sleep: Poor Number of Hours of Sleep: 4   Physical Exam Constitutional:      Appearance: Normal appearance.  HENT:     Head: Normocephalic.     Right Ear: Tympanic membrane normal.     Left Ear: Tympanic membrane normal.     Nose: Nose normal.  Eyes:     Conjunctiva/sclera: Conjunctivae normal.     Pupils: Pupils are equal, round, and reactive to light.  Cardiovascular:     Rate and Rhythm: Normal rate.  Pulmonary:     Effort: Pulmonary effort is normal.  Musculoskeletal:        General: Signs of injury present.     Cervical back: Rigidity and tenderness present.  Neurological:     Mental Status: She is alert.    Review of Systems  Musculoskeletal: Positive for back pain.  Psychiatric/Behavioral: Positive for depression, substance abuse and suicidal ideas. The patient is nervous/anxious and has insomnia.   All other systems reviewed and are negative.   Blood pressure  127/88, pulse 84, temperature 98.6 F (37 C), temperature source Oral, resp. rate 16, SpO2 100 %. There is no height or weight on file to calculate BMI.  Past Psychiatric History:   Is the patient at risk to self? Yes   Has the patient been a risk to self in the past 6 months? No .    Has the patient been a risk to self within the distant past? Yes   Is the patient a risk to others? Yes   Has the patient been a risk to others in the past 6 months? Yes   Has the patient been a risk to others within the distant past? No   Past Medical History:  Past Medical History:  Diagnosis Date  . Alcohol dependence (HCC)   . Anxiety   . Chronic pain   . Depression   . Diverticulitis   . Herniated cervical disc   . Pancreatitis   . Seizures (HCC)    alcoholic seizures    Past Surgical History:  Procedure Laterality Date  . ABDOMINAL HYSTERECTOMY    . ANKLE SURGERY Right   . CARPAL TUNNEL RELEASE Bilateral   . CERVICAL FUSION    . CHOLECYSTECTOMY    . KNEE SURGERY      Family History: History reviewed. No pertinent family history.  Social History:  Social History   Socioeconomic History  . Marital status: Single    Spouse name: Not on file  . Number of children: Not on file  . Years of education: Not on file  . Highest education level: Not on file  Occupational History  . Not on file  Tobacco Use  . Smoking status: Current Every Day Smoker    Packs/day: 0.50    Years: 7.00    Pack years: 3.50    Types: Cigarettes  . Smokeless tobacco: Never Used  Vaping Use  . Vaping Use: Never used  Substance and Sexual Activity  . Alcohol use: Yes    Comment: drinks a fifth of vodka daily  . Drug use: Not Currently    Types: Marijuana  . Sexual activity: Not Currently  Other Topics Concern  . Not on file  Social History Narrative   Pt lives in a [employer]; lives with boyfriend.   Social Determinants of Health   Financial Resource Strain:   . Difficulty of Paying Living Expenses:  Not on file  Food Insecurity:   . Worried About Programme researcher, broadcasting/film/video in the Last Year: Not on file  . Ran Out of Food in the Last Year: Not on file  Transportation Needs:   . Lack of Transportation (Medical): Not on file  . Lack of Transportation (Non-Medical): Not on file  Physical Activity:   . Days of Exercise per Week: Not on file  . Minutes of Exercise per Session: Not on file  Stress:   . Feeling of Stress : Not on file  Social Connections:   . Frequency of Communication with Friends and Family: Not on file  . Frequency of Social Gatherings with Friends and Family: Not on file  . Attends Religious Services: Not on file  . Active Member of Clubs or Organizations: Not on file  . Attends Banker Meetings: Not on file  . Marital Status: Not on file  Intimate  Partner Violence:   . Fear of Current or Ex-Partner: Not on file  . Emotionally Abused: Not on file  . Physically Abused: Not on file  . Sexually Abused: Not on file    SDOH:  SDOH Screenings   Alcohol Screen:   . Last Alcohol Screening Score (AUDIT): Not on file  Depression (PHQ2-9): Medium Risk  . PHQ-2 Score: 27  Financial Resource Strain:   . Difficulty of Paying Living Expenses: Not on file  Food Insecurity:   . Worried About Programme researcher, broadcasting/film/video in the Last Year: Not on file  . Ran Out of Food in the Last Year: Not on file  Housing:   . Last Housing Risk Score: Not on file  Physical Activity:   . Days of Exercise per Week: Not on file  . Minutes of Exercise per Session: Not on file  Social Connections:   . Frequency of Communication with Friends and Family: Not on file  . Frequency of Social Gatherings with Friends and Family: Not on file  . Attends Religious Services: Not on file  . Active Member of Clubs or Organizations: Not on file  . Attends Banker Meetings: Not on file  . Marital Status: Not on file  Stress:   . Feeling of Stress : Not on file  Tobacco Use: High Risk  .  Smoking Tobacco Use: Current Every Day Smoker  . Smokeless Tobacco Use: Never Used  Transportation Needs:   . Freight forwarder (Medical): Not on file  . Lack of Transportation (Non-Medical): Not on file    Last Labs:  Admission on 06/08/2020  Component Date Value Ref Range Status  . POC Amphetamine UR 06/09/2020 None Detected  None Detected Preliminary  . POC Secobarbital (BAR) 06/09/2020 None Detected  None Detected Preliminary  . POC Buprenorphine (BUP) 06/09/2020 None Detected  None Detected Preliminary  . POC Oxazepam (BZO) 06/09/2020 None Detected  None Detected Preliminary  . POC Cocaine UR 06/09/2020 None Detected  None Detected Preliminary  . POC Methamphetamine UR 06/09/2020 None Detected  None Detected Preliminary  . POC Morphine 06/09/2020 Positive* None Detected Preliminary  . POC Oxycodone UR 06/09/2020 None Detected  None Detected Preliminary  . POC Methadone UR 06/09/2020 None Detected  None Detected Preliminary  . POC Marijuana UR 06/09/2020 None Detected  None Detected Preliminary  . SARS Coronavirus 2 Ag 06/09/2020 Negative  Negative Preliminary  . SARS Coronavirus 2 Ag 06/09/2020 NEGATIVE  NEGATIVE Final   Comment: (NOTE) SARS-CoV-2 antigen NOT DETECTED.   Negative results are presumptive.  Negative results do not preclude SARS-CoV-2 infection and should not be used as the sole basis for treatment or other patient management decisions, including infection  control decisions, particularly in the presence of clinical signs and  symptoms consistent with COVID-19, or in those who have been in contact with the virus.  Negative results must be combined with clinical observations, patient history, and epidemiological information. The expected result is Negative.  Fact Sheet for Patients: https://sanders-williams.net/  Fact Sheet for Healthcare Providers: https://martinez.com/   This test is not yet approved or cleared by the  [place] FDA and  has been authorized for detection and/or diagnosis of SARS-CoV-2 by FDA under an Emergency Use Authorization (EUA).  This EUA will remain in effect (meaning this test can be used) for the duration of  the C  OVID-19 declaration under Section 564(b)(1) of the Act, 21 U.S.C. section 360bbb-3(b)(1), unless the authorization is terminated or revoked sooner.    Admission on 06/05/2020, Discharged on 06/06/2020  Component Date Value Ref Range Status  . Lipase 06/05/2020 34  11 - 51 U/L Final   Performed at [facility], [address] [place], [place] [address]  . Sodium 06/05/2020 131* 135 - 145 mmol/L Final  . Potassium 06/05/2020 3.1* 3.5 - 5.1 mmol/L Final  . Chloride 06/05/2020 96* 98 - 111 mmol/L Final  . CO2 06/05/2020 23  22 - 32 mmol/L Final  . Glucose, Bld 06/05/2020 98  70 - 99 mg/dL Final   Glucose reference range applies only to samples taken after fasting for at least 8 hours.  . BUN 06/05/2020 <5* 8 - 23 mg/dL Final  . Creatinine, Ser 06/05/2020 0.71  0.44 - 1.00 mg/dL Final  . Calcium 95/62/1308 9.1  8.9 - 10.3 mg/dL Final  . Total Protein 06/05/2020 6.8  6.5 - 8.1 g/dL Final  . Albumin 65/78/4696 3.5  3.5 - 5.0 g/dL Final  . AST 29/52/8413 24  15 - 41 U/L Final  . ALT 06/05/2020 10  0 - 44 U/L Final  . Alkaline Phosphatase 06/05/2020 79  38 - 126 U/L Final  . Total Bilirubin 06/05/2020 0.6  0.3 - 1.2 mg/dL Final  . GFR calc non Af Amer 06/05/2020 >60  >60 mL/min Final  . GFR calc Af Amer 06/05/2020 >60  >60 mL/min Final  . Anion gap 06/05/2020 12  5 - 15 Final   Performed at [facility], [address], [place], [place] [address]  . WBC 06/05/2020 5.5  4.0 - 10.5 K/uL Final  . RBC 06/05/2020 4.45  3.87 - 5.11 MIL/uL Final  . Hemoglobin 06/05/2020 14.6  12.0 - 15.0 g/dL Final  . HCT 02/72/5366 44.5  36 - 46 % Final  . MCV 06/05/2020 100.0  80.0 - 100.0 fL Final  . MCH 06/05/2020 32.8  26.0 - 34.0 pg Final  .  MCHC 06/05/2020 32.8  30.0 - 36.0 g/dL Final  . RDW 44/12/4740 13.4  11.5 - 15.5 % Final  . Platelets 06/05/2020 283  150 - 400 K/uL Final  . nRBC 06/05/2020 0.0  0.0 - 0.2 % Final   Performed at [facility], [address] [place], [place] [address]  . Color, Urine 06/05/2020 STRAW* YELLOW Final  . APPearance 06/05/2020 CLEAR  CLEAR Final  . Specific Gravity, Urine 06/05/2020 1.002* 1.005 - 1.030 Final  . pH 06/05/2020 6.0  5.0 - 8.0 Final  . Glucose, UA 06/05/2020 NEGATIVE  NEGATIVE mg/dL Final  . Hgb urine dipstick 06/05/2020 NEGATIVE  NEGATIVE Final  . Bilirubin Urine 06/05/2020 NEGATIVE  NEGATIVE Final  . Ketones, ur 06/05/2020 NEGATIVE  NEGATIVE mg/dL Final  . Protein, ur 87/56/4332 NEGATIVE  NEGATIVE mg/dL Final  . Nitrite 95/18/8416 NEGATIVE  NEGATIVE Final  . Glori Luis 06/05/2020 NEGATIVE  NEGATIVE Final   Performed at [facility], [address], [place], [place] [address]  . Glucose-Capillary 06/05/2020 94  70 - 99 mg/dL Final   Glucose reference range applies only to samples taken after fasting for at least 8 hours.  Admission on 03/23/2020, Discharged on 03/23/2020  Component Date Value Ref Range Status  . Lipase 03/23/2020 30  11 - 51 U/L Final   Performed at [facility], [address], [place], [place] [address]  . Sodium 03/23/2020  137  135 - 145 mmol/L Final  . Potassium 03/23/2020 3.9  3.5 - 5.1 mmol/L Final  . Chloride 03/23/2020 104  98 - 111 mmol/L Final  . CO2 03/23/2020 22  22 - 32 mmol/L Final  . Glucose, Bld 03/23/2020 88  70 - 99 mg/dL Final   Glucose reference range applies only to samples taken after fasting for at least 8 hours.  . BUN 03/23/2020 5* 8 - 23 mg/dL Final  . Creatinine, Ser 03/23/2020 0.55  0.44 - 1.00 mg/dL Final  . Calcium 69/62/9528 9.3  8.9 - 10.3 mg/dL Final  . Total Protein 03/23/2020 7.1  6.5 - 8.1 g/dL Final  . Albumin 41/32/4401 4.0  3.5 - 5.0 g/dL Final  . AST 02/72/5366 57* 15 - 41 U/L Final  . ALT  03/23/2020 62* 0 - 44 U/L Final  . Alkaline Phosphatase 03/23/2020 120  38 - 126 U/L Final  . Total Bilirubin 03/23/2020 0.4  0.3 - 1.2 mg/dL Final  . GFR calc non Af Amer 03/23/2020 >60  >60 mL/min Final  . GFR calc Af Amer 03/23/2020 >60  >60 mL/min Final  . Anion gap 03/23/2020 11  5 - 15 Final   Performed at [facility], [address], [place], [place] [address]  . WBC 03/23/2020 5.7  4.0 - 10.5 K/uL Final  . RBC 03/23/2020 4.25  3.87 - 5.11 MIL/uL Final  . Hemoglobin 03/23/2020 13.8  12.0 - 15.0 g/dL Final  . HCT 74/25/9563 41.0  36 - 46 % Final  . MCV 03/23/2020 96.5  80.0 - 100.0 fL Final  . MCH 03/23/2020 32.5  26.0 - 34.0 pg Final  . MCHC 03/23/2020 33.7  30.0 - 36.0 g/dL Final  . RDW 87/56/4332 13.2  11.5 - 15.5 % Final  . Platelets 03/23/2020 366  150 - 400 K/uL Final  . nRBC 03/23/2020 0.0  0.0 - 0.2 % Final   Performed at [facility], [address] [place], [place] [address]  . Color, Urine 03/23/2020 STRAW* YELLOW Final  . APPearance 03/23/2020 CLEAR  CLEAR Final  . Specific Gravity, Urine 03/23/2020 1.002* 1.005 - 1.030 Final  . pH 03/23/2020 5.0  5.0 - 8.0 Final  . Glucose, UA 03/23/2020 NEGATIVE  NEGATIVE mg/dL Final  . Hgb urine dipstick 03/23/2020 NEGATIVE  NEGATIVE Final  . Bilirubin Urine 03/23/2020 NEGATIVE  NEGATIVE Final  . Ketones, ur 03/23/2020 NEGATIVE  NEGATIVE mg/dL Final  . Protein, ur 41/66/0630 NEGATIVE  NEGATIVE mg/dL Final  . Nitrite 16/10/930 NEGATIVE  NEGATIVE Final  . Glori Luis 03/23/2020 NEGATIVE  NEGATIVE Final   Performed at [facility], [address] [place], [place] [address]  Admission on 03/11/2020, Discharged on 03/12/2020  Component Date Value Ref Range Status  . Lipase 03/11/2020 32  11 - 51 U/L Final   Performed at [facility], [address], [place], [place] [address]  . Sodium 03/11/2020 138  135 - 145 mmol/L Final  . Potassium 03/11/2020 3.7  3.5 - 5.1 mmol/L Final  . Chloride 03/11/2020 105   98 - 111 mmol/L Final  . CO2 03/11/2020 22  22 - 32 mmol/L Final  . Glucose, Bld 03/11/2020 90  70 - 99 mg/dL Final   Glucose reference range applies only to samples taken after fasting for at least 8 hours.  . BUN 03/11/2020 6* 8 - 23 mg/dL Final  . Creatinine, Ser 03/11/2020 0.61  0.44 - 1.00 mg/dL Final  .  Calcium 03/11/2020 9.6  8.9 - 10.3 mg/dL Final  . Total Protein 03/11/2020 6.9  6.5 - 8.1 g/dL Final  . Albumin 16/07/9603 4.0  3.5 - 5.0 g/dL Final  . AST 54/06/8118 56* 15 - 41 U/L Final  . ALT 03/11/2020 37  0 - 44 U/L Final  . Alkaline Phosphatase 03/11/2020 68  38 - 126 U/L Final  . Total Bilirubin 03/11/2020 0.4  0.3 - 1.2 mg/dL Final  . GFR calc non Af Amer 03/11/2020 >60  >60 mL/min Final  . GFR calc Af Amer 03/11/2020 >60  >60 mL/min Final  . Anion gap 03/11/2020 11  5 - 15 Final   Performed at [facility], [address], [place], [place] [address]  . WBC 03/11/2020 6.2  4.0 - 10.5 K/uL Final  . RBC 03/11/2020 4.02  3.87 - 5.11 MIL/uL Final  . Hemoglobin 03/11/2020 13.0  12.0 - 15.0 g/dL Final  . HCT 95/62/1308 40.0  36 - 46 % Final  . MCV 03/11/2020 99.5  80.0 - 100.0 fL Final  . MCH 03/11/2020 32.3  26.0 - 34.0 pg Final  . MCHC 03/11/2020 32.5  30.0 - 36.0 g/dL Final  . RDW 65/78/4696 13.5  11.5 - 15.5 % Final  . Platelets 03/11/2020 422* 150 - 400 K/uL Final  . nRBC 03/11/2020 0.0  0.0 - 0.2 % Final   Performed at [facility], [address], [place], [place] [address]  . Color, Urine 03/11/2020 YELLOW  YELLOW Final  . APPearance 03/11/2020 CLEAR  CLEAR Final  . Specific Gravity, Urine 03/11/2020 <1.005* 1.005 - 1.030 Final  . pH 03/11/2020 5.5  5.0 - 8.0 Final  . Glucose, UA 03/11/2020 NEGATIVE  NEGATIVE mg/dL Final  . Hgb urine dipstick 03/11/2020 NEGATIVE  NEGATIVE Final  . Bilirubin Urine 03/11/2020 NEGATIVE  NEGATIVE Final  . Ketones, ur 03/11/2020 NEGATIVE  NEGATIVE mg/dL Final  . Protein, ur 41/32/4401 NEGATIVE  NEGATIVE mg/dL Final  .  Nitrite 02/72/5366 POSITIVE* NEGATIVE Final  . Glori Luis 03/11/2020 NEGATIVE  NEGATIVE Final   Performed at [facility], [address], [place], [place] [address]  . RBC / HPF 03/11/2020 NONE SEEN  0 - 5 RBC/hpf Final  . WBC, UA 03/11/2020 NONE SEEN  0 - 5 WBC/hpf Final  . Bacteria, UA 03/11/2020 NONE SEEN  NONE SEEN Final  . Squamous Epithelial / LPF 03/11/2020 0-5  0 - 5 Final   Performed at [facility], [address], [place], [place] [address]  Admission on 02/28/2020, Discharged on 03/04/2020  Component Date Value Ref Range Status  . Lipase 02/28/2020 33  11 - 51 U/L Final   Performed at [facility], [address] [place], [place] [address]  . Sodium 02/28/2020 139  135 - 145 mmol/L Final  . Potassium 02/28/2020 3.8  3.5 - 5.1 mmol/L Final  . Chloride 02/28/2020 101  98 - 111 mmol/L Final  . CO2 02/28/2020 25  22 - 32 mmol/L Final  . Glucose, Bld 02/28/2020 89  70 - 99 mg/dL Final   Glucose reference range applies only to samples taken after fasting for at least 8 hours.  . BUN 02/28/2020 6* 8 - 23 mg/dL Final  . Creatinine, Ser 02/28/2020 0.71  0.44 - 1.00 mg/dL Final  . Calcium 56/43/3295 9.9  8.9 - 10.3 mg/dL Final  . Total Protein 02/28/2020 7.4  6.5 - 8.1 g/dL Final  . Albumin 18/84/1660 4.2  3.5 - 5.0 g/dL Final  . AST 80/99/8338 33  15 - 41 U/L Final  . ALT 02/28/2020 28  0 - 44 U/L Final  . Alkaline Phosphatase 02/28/2020 65  38 - 126 U/L Final  . Total Bilirubin 02/28/2020 0.8  0.3 - 1.2 mg/dL Final  . GFR calc non Af Amer 02/28/2020 >60  >60 mL/min Final  . GFR calc Af Amer 02/28/2020 >60  >60 mL/min Final  . Anion gap 02/28/2020 13  5 - 15 Final   Performed at [facility], [address], [place], [place] [address]  . WBC 02/28/2020 6.4  4.0 - 10.5 K/uL Final  . RBC 02/28/2020 4.17  3.87 - 5.11 MIL/uL Final  . Hemoglobin 02/28/2020 13.5  12.0 - 15.0 g/dL Final  . HCT 97/67/3419 42.2  36 - 46 % Final  . MCV 02/28/2020 101.2* 80.0 -  100.0 fL Final  . MCH 02/28/2020 32.4  26.0 - 34.0 pg Final  . MCHC 02/28/2020 32.0  30.0 - 36.0 g/dL Final  . RDW 37/90/2409 14.6  11.5 - 15.5 % Final  . Platelets 02/28/2020 262  150 - 400 K/uL Final  . nRBC 02/28/2020 0.0  0.0 - 0.2 % Final   Performed at [facility], [address] [place], [place] [address]  . Color, Urine 02/28/2020 YELLOW  YELLOW Final  . APPearance 02/28/2020 CLEAR  CLEAR Final  . Specific Gravity, Urine 02/28/2020 1.003* 1.005 - 1.030 Final  . pH 02/28/2020 6.0  5.0 - 8.0 Final  . Glucose, UA 02/28/2020 NEGATIVE  NEGATIVE mg/dL Final  . Hgb urine dipstick 02/28/2020 NEGATIVE  NEGATIVE Final  . Bilirubin Urine 02/28/2020 NEGATIVE  NEGATIVE Final  . Ketones, ur 02/28/2020 NEGATIVE  NEGATIVE mg/dL Final  . Protein, ur 99/24/2683 NEGATIVE  NEGATIVE mg/dL Final  . Nitrite 41/96/2229 NEGATIVE  NEGATIVE Final  . Glori Luis 02/28/2020 NEGATIVE  NEGATIVE Final   Performed at [facility], [address], [place], [place] [address]  . Troponin I (High Sensitivity) 02/28/2020 3  <18 ng/L Final   Comment: (NOTE) Elevated high sensitivity troponin I (hsTnI) values and significant  changes across serial measurements may suggest ACS but many other  chronic and acute conditions are known to elevate hsTnI results.  Refer to the "Links" section for chest pain algorithms and additional  guidance. Performed at [facility], [address], [place], [place] [address]   . Troponin I (High Sensitivity) 02/28/2020 4  <18 ng/L Final   Comment: (NOTE) Elevated high sensitivity troponin I (hsTnI) values and significant  changes across serial measurements may suggest ACS but many other  chronic and acute conditions are known to elevate hsTnI results.  Refer to the "Links" section for chest pain algorithms and additional  guidance. Performed at [facility], [address] [place], [place] [address]   . SARS Coronavirus 2 02/28/2020 NEGATIVE  NEGATIVE  Final   Comment: (NOTE) SARS-CoV-2 target nucleic acids are NOT DETECTED. The SARS-CoV-2 RNA is generally detectable in upper and lower respiratory specimens during the acute phase of infection. The lowest concentration of SARS-CoV-2 viral copies this assay can detect is 250 copies / mL. A negative result does not preclude SARS-CoV-2 infection and should not be used as the sole basis for treatment or other patient management decisions.  A negative result may occur with improper specimen collection / handling, submission of specimen other than nasopharyngeal swab, presence of viral mutation(s) within the  areas targeted by this assay, and inadequate number of viral copies (<250 copies / mL). A negative result must be combined with clinical observations, patient history, and epidemiological information. Fact Sheet for Patients:   BoilerBrush.com.cy Fact Sheet for Healthcare Providers: https://pope.com/ This test is not yet approved or cleared                           by the [place] FDA and has been authorized for detection and/or diagnosis of SARS-CoV-2 by FDA under an Emergency Use Authorization (EUA).  This EUA will remain in effect (meaning this test can be used) for the duration of the COVID-19 declaration under Section 564(b)(1) of the Act, 21 U.S.C. section 360bbb-3(b)(1), unless the authorization is terminated or revoked sooner. Performed at [facility], [address] [place], [place] [address]   . Sodium 02/29/2020 139  135 - 145 mmol/L Final  . Potassium 02/29/2020 3.7  3.5 - 5.1 mmol/L Final  . Chloride 02/29/2020 105  98 - 111 mmol/L Final  . CO2 02/29/2020 21* 22 - 32 mmol/L Final  . Glucose, Bld 02/29/2020 98  70 - 99 mg/dL Final   Glucose reference range applies only to samples taken after fasting for at least 8 hours.  . BUN 02/29/2020 6* 8 - 23 mg/dL Final  . Creatinine, Ser 02/29/2020 0.67  0.44 - 1.00  mg/dL Final  . Calcium 09/32/3557 9.4  8.9 - 10.3 mg/dL Final  . GFR calc non Af Amer 02/29/2020 >60  >60 mL/min Final  . GFR calc Af Amer 02/29/2020 >60  >60 mL/min Final  . Anion gap 02/29/2020 13  5 - 15 Final   Performed at [facility], [address], [place], [place] [address]  . WBC 02/29/2020 4.5  4.0 - 10.5 K/uL Final  . RBC 02/29/2020 4.30  3.87 - 5.11 MIL/uL Final  . Hemoglobin 02/29/2020 14.0  12.0 - 15.0 g/dL Final  . HCT 54/27/0623 43.9  36 - 46 % Final  . MCV 02/29/2020 102.1* 80.0 - 100.0 fL Final  . MCH 02/29/2020 32.6  26.0 - 34.0 pg Final  . MCHC 02/29/2020 31.9  30.0 - 36.0 g/dL Final  . RDW 76/28/3151 14.7  11.5 - 15.5 % Final  . Platelets 02/29/2020 264  150 - 400 K/uL Final  . nRBC 02/29/2020 0.0  0.0 - 0.2 % Final   Performed at [facility], [address], [place], [place] [address]  . WBC 03/01/2020 4.4  4.0 - 10.5 K/uL Final  . RBC 03/01/2020 3.85* 3.87 - 5.11 MIL/uL Final  . Hemoglobin 03/01/2020 12.3  12.0 - 15.0 g/dL Final  . HCT 73/71/0626 40.1  36 - 46 % Final  . MCV 03/01/2020 104.2* 80.0 - 100.0 fL Final  . MCH 03/01/2020 31.9  26.0 - 34.0 pg Final  . MCHC 03/01/2020 30.7  30.0 - 36.0 g/dL Final  . RDW 94/85/4627 14.2  11.5 - 15.5 % Final  . Platelets 03/01/2020 220  150 - 400 K/uL Final  . nRBC 03/01/2020 0.0  0.0 - 0.2 % Final   Performed at [facility], [address], [place], [place] [address]  . Sodium 03/01/2020 145  135 - 145 mmol/L Final  . Potassium 03/01/2020 5.0  3.5 - 5.1 mmol/L Final   NO VISIBLE HEMOLYSIS  . Chloride 03/01/2020 112* 98 - 111 mmol/L Final  . CO2 03/01/2020 25  22 -  32 mmol/L Final  . Glucose, Bld 03/01/2020 88  70 - 99 mg/dL Final   Glucose reference range applies only to samples taken after fasting for at least 8 hours.  . BUN 03/01/2020 <5* 8 - 23 mg/dL Final  . Creatinine, Ser 03/01/2020 0.71  0.44 - 1.00 mg/dL Final  . Calcium 81/19/1478 9.3  8.9 - 10.3 mg/dL Final  . GFR calc non Af Amer  03/01/2020 >60  >60 mL/min Final  . GFR calc Af Amer 03/01/2020 >60  >60 mL/min Final  . Anion gap 03/01/2020 8  5 - 15 Final   Performed at [facility], [address] [place], [place] [address]  . Magnesium 03/01/2020 1.7  1.7 - 2.4 mg/dL Final   Performed at [facility], [address], [place], [place] [address]  . WBC 03/02/2020 3.7* 4.0 - 10.5 K/uL Final  . RBC 03/02/2020 3.71* 3.87 - 5.11 MIL/uL Final  . Hemoglobin 03/02/2020 11.9* 12.0 - 15.0 g/dL Final  . HCT 57/84/6962 37.8  36 - 46 % Final  . MCV 03/02/2020 101.9* 80.0 - 100.0 fL Final  . MCH 03/02/2020 32.1  26.0 - 34.0 pg Final  . MCHC 03/02/2020 31.5  30.0 - 36.0 g/dL Final  . RDW 95/28/4132 13.6  11.5 - 15.5 % Final  . Platelets 03/02/2020 242  150 - 400 K/uL Final  . nRBC 03/02/2020 0.0  0.0 - 0.2 % Final   Performed at [facility], [address] [place], [place] [address]  . Sodium 03/02/2020 142  135 - 145 mmol/L Final  . Potassium 03/02/2020 4.2  3.5 - 5.1 mmol/L Final  . Chloride 03/02/2020 107  98 - 111 mmol/L Final  . CO2 03/02/2020 27  22 - 32 mmol/L Final  . Glucose, Bld 03/02/2020 84  70 - 99 mg/dL Final   Glucose reference range applies only to samples taken after fasting for at least 8 hours.  . BUN 03/02/2020 <5* 8 - 23 mg/dL Final  . Creatinine, Ser 03/02/2020 0.67  0.44 - 1.00 mg/dL Final  . Calcium 27/25/3664 9.3  8.9 - 10.3 mg/dL Final  . GFR calc non Af Amer 03/02/2020 >60  >60 mL/min Final  . GFR calc Af Amer 03/02/2020 >60  >60 mL/min Final  . Anion gap 03/02/2020 8  5 - 15 Final   Performed at [facility], [address] [place], [place] [address]  . WBC 03/04/2020 3.8* 4.0 - 10.5 K/uL Final  . RBC 03/04/2020 3.73* 3.87 - 5.11 MIL/uL Final  . Hemoglobin 03/04/2020 12.3  12.0 - 15.0 g/dL Final  . HCT 42/59/5638 37.3  36 - 46 % Final  . MCV 03/04/2020 100.0  80.0 - 100.0 fL Final  . MCH 03/04/2020 33.0  26.0 - 34.0 pg Final  . MCHC 03/04/2020 33.0  30.0 - 36.0 g/dL  Final  . RDW 75/64/3329 14.0  11.5 - 15.5 % Final  . Platelets 03/04/2020 280  150 - 400 K/uL Final  . nRBC 03/04/2020 0.0  0.0 - 0.2 % Final   Performed at [facility], [address], [place], [place] [address]    Allergies: Cephalexin, Ketorolac, Prednisone, and Tramadol  PTA Medications: (Not in a hospital admission)   Medical Decision Making  The patient will remain under observation overnight and reassess in the A.M. to determine if she meets the criteria for psychiatric inpatient admission or can be discharged.  Recommendations  Based on  my evaluation the patient does not appear to have an emergency medical condition.  Gillermo Murdoch, NP 06/09/20  3:16 AM

## 2020-06-09 NOTE — ED Notes (Signed)
Safe transport called to take patient to [facility] 

## 2020-06-09 NOTE — ED Notes (Signed)
Pt sleeping at present, no distress noted, calm & cooperative, monitoring for safety. °

## 2020-06-09 NOTE — ED Provider Notes (Addendum)
Report received from Dr. Marvene Staff, and patient has been cleared to return to [facility].  Patient has been accepted. Provider to provider call completed, this nurse practitioner communicated with nursing staff that patient is returning to [facility]. WIll continue to observe patient and reassess tomorrow as needed for inpatient or outpatient level of care.

## 2020-06-09 NOTE — ED Notes (Signed)
Pt presents with SI, plan to overdose on pills and drink alcohol.  Denies HI or AVH.  Complaint of depression, crying spells and hopelessness. History of alcohol abuse, drinking alcohol daily for past year.  Reports she stopped 3 days ago and is requesting rehab.  Fracture of right foot noted.  Pt with rt shoe boot on.  Pt is high fall risk.  Skin search completed. Pt pleasant, cooperative.  Monitoring for safety, no distress noted.

## 2020-06-09 NOTE — ED Notes (Addendum)
LAB CALLED TO REPORT A CRITICAL SERUM POTASSIUM OF 2.6. NP JACQUELINE THOMPSON NOTIFIED.

## 2020-06-10 MED ORDER — DULOXETINE HCL 30 MG PO CPEP
30.0000 mg | ORAL_CAPSULE | Freq: Every day | ORAL | Status: DC
  Administered 2020-06-10: 30 mg via ORAL
  Filled 2020-06-10: qty 14
  Filled 2020-06-10: qty 1

## 2020-06-10 MED ORDER — GABAPENTIN 300 MG PO CAPS: 300.0000 mg | ORAL_CAPSULE | Freq: Three times a day (TID) | ORAL | Status: DC

## 2020-06-10 MED ORDER — PANTOPRAZOLE SODIUM 40 MG PO TBEC
40.0000 mg | DELAYED_RELEASE_TABLET | Freq: Every day | ORAL | Status: DC
  Administered 2020-06-10: 40 mg via ORAL
  Filled 2020-06-10: qty 1

## 2020-06-10 MED ORDER — DULOXETINE HCL 30 MG PO CPEP: 30.0000 mg | ORAL_CAPSULE | Freq: Every day | ORAL | 0 refills | Status: DC

## 2020-06-10 NOTE — Discharge Instructions (Addendum)
Take medications as prescribed

## 2020-06-10 NOTE — ED Notes (Signed)
Pt sleeping at present, no distress noted, monitoring for safety. 

## 2020-06-10 NOTE — ED Notes (Addendum)
Pt alert and oriented on the unit. Education, support, and encouragement provided. Discharge summary, medications and follow up appointments reviewed with pt and uicide prevention resources provided. Pt's belongings in locker returned and belongings sheet signed. Pt denies SI/HI, A/VH or any concerns at this time. Pt ambulatory on and off unit. Pt discharged to [place] to be transported to [facility] via [employer].

## 2020-06-10 NOTE — ED Provider Notes (Signed)
FBC/OBS ASAP Discharge Summary  Date and Time: 06/10/2020 12:35 PM  Name: [name]  MRN:  [id number]   Discharge Diagnoses:  Final diagnoses:  None    Subjective: Patient reports today that she feels much better after she got some sleep last night.  She states that she was going to [facility] but states that she is open to going to anyone else.  She states that she does not feel like the Prozac really helped her that much.  She states that she is continued to take the Neurontin as well as her Protonix that was previously prescribed to her.  She states that she would like to try Cymbalta because of the discussed starting her on it but she is not going back to see them because her appointment was canceled.  She denies having any suicidal or homicidal ideations and denies any hallucinations today.  Patient reports that she has been spending a lot of her Detron Carras by staying in hotels because she has been to other shelters but feels like some of them are not a good appropriate place for her to be at.  She is informed her [employer] and is informed of the criteria for being there and patient states that she can follow-up with them and feels that that would be a safe place for her to go.  Stay Summary: Patient is a 64 year old female who presented to the [facility] voluntarily.  Patient showed a potassium level and was sent to the ED for evaluation and was given potassium her potassium level was 3.2.  Patient has a history of substance abuse with alcohol.  She was then transferred back to the [facility] and reports today that she is feeling better.  Patient was started on Cymbalta 30 mg p.o. daily and was instructed continue her Neurontin 300 mg p.o. 4 times daily.  Daily.  Patient had continued to deny any suicidal homicidal ideations and denied any hallucinations.  Patient stated that she had nowhere to go and had no family in this area anymore to where she had housing.  Patient has been using her [employer]gett to  pay for hotels and felt that she needed to have a separate.  Patient has agreed to go to [facility] rescue mission if she is informed of their criteria for admission.  Patient stated that she would be able to do this.  Patient was seen ambulating multiple times and ambulated with this provider across the courtyard outside with just socks on and showed no deficits in ambulation.  Patient was discharged to [facility] rescue mission with prescriptions for her medication, samples, as well as provided transportation via safe transport. Also did note during rescue mission contacted [place] and stated that the patient had difficulty climbing a ladder to get onto a second bunk.  Informed them that the patient had ambulated fine here and they stated understanding and agreement but however they were going to have to find someone else for her to stay because they had no bottom bunks open.  They stated that they were going to refer her to [facility].  Total Time spent with patient: 30 minutes  Past Psychiatric History: SI, homeless, MDD, ETOH abuse Past Medical History:  Past Medical History:  Diagnosis Date  . Alcohol dependence (HCC)   . Anxiety   . Chronic pain   . Depression   . Diverticulitis   . Herniated cervical disc   . Pancreatitis   . Seizures (HCC)    alcoholic seizures  Past Surgical History:  Procedure Laterality Date  . ABDOMINAL HYSTERECTOMY    . ANKLE SURGERY Right   . CARPAL TUNNEL RELEASE Bilateral   . CERVICAL FUSION    . CHOLECYSTECTOMY    . KNEE SURGERY     Family History: No family history on file. Family Psychiatric History: None reported Social History:  Social History   Substance and Sexual Activity  Alcohol Use Yes   Comment: drinks a fifth of vodka daily     Social History   Substance and Sexual Activity  Drug Use Not Currently  . Types: Marijuana    Social History   Socioeconomic History  . Marital status: Single    Spouse name: Not on file  . Number of  children: Not on file  . Years of education: Not on file  . Highest education level: Not on file  Occupational History  . Not on file  Tobacco Use  . Smoking status: Current Every Day Smoker    Packs/day: 0.50    Years: 7.00    Pack years: 3.50    Types: Cigarettes  . Smokeless tobacco: Never Used  Vaping Use  . Vaping Use: Never used  Substance and Sexual Activity  . Alcohol use: Yes    Comment: drinks a fifth of vodka daily  . Drug use: Not Currently    Types: Marijuana  . Sexual activity: Not Currently  Other Topics Concern  . Not on file  Social History Narrative   Pt lives in a [employer]; lives with boyfriend.   Social Determinants of Health   Financial Resource Strain:   . Difficulty of Paying Living Expenses: Not on file  Food Insecurity:   . Worried About Programme researcher, broadcasting/film/video in the Last Year: Not on file  . Ran Out of Food in the Last Year: Not on file  Transportation Needs:   . Lack of Transportation (Medical): Not on file  . Lack of Transportation (Non-Medical): Not on file  Physical Activity:   . Days of Exercise per Week: Not on file  . Minutes of Exercise per Session: Not on file  Stress:   . Feeling of Stress : Not on file  Social Connections:   . Frequency of Communication with Friends and Family: Not on file  . Frequency of Social Gatherings with Friends and Family: Not on file  . Attends Religious Services: Not on file  . Active Member of Clubs or Organizations: Not on file  . Attends Banker Meetings: Not on file  . Marital Status: Not on file   SDOH:  SDOH Screenings   Alcohol Screen:   . Last Alcohol Screening Score (AUDIT): Not on file  Depression (PHQ2-9): Medium Risk  . PHQ-2 Score: 27  Financial Resource Strain:   . Difficulty of Paying Living Expenses: Not on file  Food Insecurity:   . Worried About Programme researcher, broadcasting/film/video in the Last Year: Not on file  . Ran Out of Food in the Last Year: Not on file  Housing:   . Last  Housing Risk Score: Not on file  Physical Activity:   . Days of Exercise per Week: Not on file  . Minutes of Exercise per Session: Not on file  Social Connections:   . Frequency of Communication with Friends and Family: Not on file  . Frequency of Social Gatherings with Friends and Family: Not on file  . Attends Religious Services: Not on file  . Active Member of Clubs  or Organizations: Not on file  . Attends Banker Meetings: Not on file  . Marital Status: Not on file  Stress:   . Feeling of Stress : Not on file  Tobacco Use: High Risk  . Smoking Tobacco Use: Current Every Day Smoker  . Smokeless Tobacco Use: Never Used  Transportation Needs:   . Freight forwarder (Medical): Not on file  . Lack of Transportation (Non-Medical): Not on file    Has this patient used any form of tobacco in the last 30 days? (Cigarettes, Smokeless Tobacco, Cigars, and/or Pipes) A prescription for an FDA-approved tobacco cessation medication was offered at discharge and the patient refused  Current Medications:  Current Facility-Administered Medications  Medication Dose Route Frequency Provider Last Rate Last Admin  . acetaminophen (TYLENOL) tablet 650 mg  650 mg Oral Q6H PRN Maryagnes Amos, FNP   650 mg at 06/10/20 2440  . alum & mag hydroxide-simeth (MAALOX/MYLANTA) 200-200-20 MG/5ML suspension 30 mL  30 mL Oral Q4H PRN Rosario Adie, Juel Burrow, FNP      . DULoxetine (CYMBALTA) DR capsule 30 mg  30 mg Oral Daily Melquisedec Journey, [name], FNP   30 mg at 06/10/20 1221  . gabapentin (NEURONTIN) capsule 300 mg  300 mg Oral TID Quantel Mcinturff, Feliz Beam B, FNP      . magnesium hydroxide (MILK OF MAGNESIA) suspension 30 mL  30 mL Oral Daily PRN Rosario Adie, Juel Burrow, FNP      . pantoprazole (PROTONIX) EC tablet 40 mg  40 mg Oral Daily Broc Caspers, [name], FNP   40 mg at 06/10/20 1221   Current Outpatient Medications  Medication Sig Dispense Refill  . gabapentin (NEURONTIN) 300 MG capsule Take 1 capsule (300  mg total) by mouth 3 (three) times daily. 90 capsule 0  . HYDROcodone-acetaminophen (NORCO/VICODIN) 5-325 MG tablet Take 1 tablet by mouth every 6 (six) hours as needed. (Patient not taking: Reported on 06/09/2020) 15 tablet 0  . pantoprazole (PROTONIX) 40 MG tablet Take 1 tablet (40 mg total) by mouth daily. 30 tablet 0    PTA Medications: (Not in a hospital admission)   Musculoskeletal  Strength & Muscle Tone: within normal limits Gait & Station: normal Patient leans: N/A  Psychiatric Specialty Exam  Presentation  General Appearance: Appropriate for Environment;Casual  Eye Contact:Good  Speech:Clear and Coherent;Normal Rate  Speech Volume:Normal  Handedness:Right   Mood and Affect  Mood:Anxious;Depressed  Affect:Congruent;Appropriate   Thought Process  Thought Processes:Coherent  Descriptions of Associations:Intact  Orientation:Full (Time, Place and Person)  Thought Content:WDL  Hallucinations:Hallucinations: None  Ideas of Reference:None  Suicidal Thoughts:Suicidal Thoughts: No SI Active Intent and/or Plan: With Plan SI Passive Intent and/or Plan: Without Plan  Homicidal Thoughts:Homicidal Thoughts: No   Sensorium  Memory:Immediate Good;Recent Good;Remote Good  Judgment:Good  Insight:Good   Executive Functions  Concentration:Good  Attention Span:Good  Recall:Good  Fund of Knowledge:Good  Language:Good   Psychomotor Activity  Psychomotor Activity:Psychomotor Activity: Normal   Assets  Assets:Communication Skills;Desire for Improvement;Financial Resources/Insurance;Transportation   Sleep  Sleep:Sleep: Good Number of Hours of Sleep: 4   Physical Exam  Physical Exam Vitals and nursing note reviewed.  Constitutional:      Appearance: She is well-developed.  Cardiovascular:     Rate and Rhythm: Normal rate.  Pulmonary:     Effort: Pulmonary effort is normal.  Musculoskeletal:        General: Normal range of motion.  Skin:     General: Skin is warm.  Neurological:  Mental Status: She is alert and oriented to person, place, and time.    Review of Systems  Constitutional: Negative.   HENT: Negative.   Eyes: Negative.   Respiratory: Negative.   Cardiovascular: Negative.   Gastrointestinal: Negative.   Genitourinary: Negative.   Musculoskeletal: Negative.   Skin: Negative.   Neurological: Negative.   Endo/Heme/Allergies: Negative.   Psychiatric/Behavioral: Positive for depression and substance abuse.   Blood pressure (!) 156/74, pulse 63, temperature 97.8 F (36.6 C), temperature source Temporal, resp. rate 18, SpO2 100 %. There is no height or weight on file to calculate BMI.  Demographic Factors:  Caucasian and Low socioeconomic status  Loss Factors: NA  Historical Factors: NA  Risk Reduction Factors:   Religious beliefs about death, Living with another person, especially a relative, Positive social support, Positive therapeutic relationship and receives funds through SSI  Continued Clinical Symptoms:  Alcohol/Substance Abuse/Dependencies Previous Psychiatric Diagnoses and Treatments  Cognitive Features That Contribute To Risk:  None    Suicide Risk:  Mild:  Suicidal ideation of limited frequency, intensity, duration, and specificity.  There are no identifiable plans, no associated intent, mild dysphoria and related symptoms, good self-control (both objective and subjective assessment), few other risk factors, and identifiable protective factors, including available and accessible social support.  Plan Of Care/Follow-up recommendations:  Continue activity as tolerated. Continue diet as recommended by your PCP. Ensure to keep all appointments with outpatient providers.  [facility]  Maryfrances Bunnellravis B Keaundre Thelin, [place] 06/10/2020, 12:35 PM

## 2020-06-10 NOTE — ED Notes (Signed)
Pt ambulatory, alert, oriented and resting in her chair bed comfortably. Pt denies SI/HI, A/VH. Education, support and encouragement provided. No reactions/side effects to medicine noted. Pt denies any concerns at this time, and verbally contracts for safety. Pt ambulating on the unit with no issues. Pt remains safe on the unit.Marland Kitchen

## 2020-06-10 NOTE — ED Notes (Signed)
Patient given meal; sandwich, soda

## 2020-06-10 NOTE — ED Notes (Signed)
Patient is outside  

## 2020-06-12 NOTE — ED Provider Notes (Signed)
[facility] EMERGENCY DEPARTMENT Provider Note   CSN: [id number] Arrival date & time: 06/05/20  1320     History No chief complaint on file.   [name] is a 64 y.o. female.  HPI   64 year old female with multiple complaints.  She reports recent fall with right shoulder and right foot pain.  History alcohol abuse and she is concerned that she continues to drink.  Also some abdominal pain and think she is having a flare of diverticulitis.  No vomiting or diarrhea.  No blood in stool.  No acute urinary complaints.  Past Medical History:  Diagnosis Date  . Alcohol dependence (HCC)   . Anxiety   . Chronic pain   . Depression   . Diverticulitis   . Herniated cervical disc   . Pancreatitis   . Seizures (HCC)    alcoholic seizures    Patient Active Problem List   Diagnosis Date Noted  . Diverticulitis 02/29/2020  . Acute diverticulitis 02/28/2020  . GERD (gastroesophageal reflux disease) 02/28/2020  . Suicidal ideation   . Delirium tremens (HCC) 12/13/2018  . Thrombocytopenia (HCC) 12/13/2018  . Homeless 12/13/2018  . Depression 12/13/2018  . Neuropathy 12/13/2018  . Subarachnoid hemorrhage (HCC) 12/11/2018  . Seizure due to alcohol withdrawal (HCC) 12/11/2018  . Alcohol dependence with alcohol-induced mood disorder (HCC)   . 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 Major depressive disorder, 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    Past Surgical History:  Procedure Laterality Date  . ABDOMINAL HYSTERECTOMY    . ANKLE SURGERY Right   . CARPAL TUNNEL RELEASE Bilateral   . CERVICAL FUSION    . CHOLECYSTECTOMY    . KNEE SURGERY       OB History   No obstetric history on file.     No family history on file.  Social History   Tobacco Use  . Smoking status: Current Every Day Smoker    Packs/day: 0.50    Years: 7.00    Pack years: 3.50    Types: Cigarettes  .  Smokeless tobacco: Never Used  Vaping Use  . Vaping Use: Never used  Substance Use Topics  . Alcohol use: Yes    Comment: drinks a fifth of vodka daily  . Drug use: Not Currently    Types: Marijuana    Home Medications Prior to Admission medications   Medication Sig Start Date End Date Taking? Authorizing Provider  DULoxetine (CYMBALTA) 30 MG capsule Take 1 capsule (30 mg total) by mouth daily. 06/11/20   Money, [name], FNP  gabapentin (NEURONTIN) 300 MG capsule Take 1 capsule (300 mg total) by mouth 3 (three) times daily. 06/06/20   Raeford Razor, MD  HYDROcodone-acetaminophen (NORCO/VICODIN) 5-325 MG tablet Take 1 tablet by mouth every 6 (six) hours as needed. Patient not taking: Reported on 06/09/2020 06/06/20   Raeford Razor, MD  pantoprazole (PROTONIX) 40 MG tablet Take 1 tablet (40 mg total) by mouth daily. 08/04/19   Lorelee New, PA-C  metoCLOPramide (REGLAN) 10 MG tablet Take 1 tablet (10 mg total) by mouth every 6 (six) hours as needed for nausea (nausea/headache). 04/06/19 05/24/19  Mesner, Barbara Cower, MD    Allergies    Cephalexin, Ketorolac, Prednisone, and Tramadol  Review of Systems   Review of Systems All systems reviewed and negative, other than as noted in HPI.  Physical Exam Updated Vital Signs BP (!) 126/92   Pulse 91   Temp 98.4  F (36.9 C) (Oral)   Resp 18   SpO2 100%   Physical Exam Vitals and nursing note reviewed.  Constitutional:      General: She is not in acute distress.    Appearance: She is well-developed.  HENT:     Head: Normocephalic and atraumatic.  Eyes:     General:        Right eye: No discharge.        Left eye: No discharge.     Conjunctiva/sclera: Conjunctivae normal.  Cardiovascular:     Rate and Rhythm: Normal rate and regular rhythm.     Heart sounds: Normal heart sounds. No murmur heard.  No friction rub. No gallop.   Pulmonary:     Effort: Pulmonary effort is normal. No respiratory distress.     Breath sounds: Normal  breath sounds.  Abdominal:     General: There is no distension.     Palpations: Abdomen is soft.     Tenderness: There is no abdominal tenderness.  Musculoskeletal:        General: Tenderness present.     Cervical back: Neck supple.     Comments: Tenderness to palpation over mid to lateral right foot.  Can actively range of the ankle.  Tenderness to palpation of proximal humerus/shoulder.  Increased pain with range of motion.  No midline spinal tenderness.  Skin:    General: Skin is warm and dry.  Neurological:     Mental Status: She is alert.  Psychiatric:        Behavior: Behavior normal.        Thought Content: Thought content normal.     ED Results / Procedures / Treatments   Labs (all labs ordered are listed, but only abnormal results are displayed) Labs Reviewed  COMPREHENSIVE METABOLIC PANEL - Abnormal; Notable for the following components:      Result Value   Sodium 131 (*)    Potassium 3.1 (*)    Chloride 96 (*)    BUN <5 (*)    All other components within normal limits  URINALYSIS, ROUTINE W REFLEX MICROSCOPIC - Abnormal; Notable for the following components:   Color, Urine STRAW (*)    Specific Gravity, Urine 1.002 (*)    All other components within normal limits  LIPASE, BLOOD  CBC  CBG MONITORING, ED    EKG None  Radiology No results found.  Procedures Procedures (including critical care time)  Medications Ordered in ED Medications  oxyCODONE-acetaminophen (PERCOCET/ROXICET) 5-325 MG per tablet 1 tablet (1 tablet Oral Given 06/06/20 1252)  ondansetron (ZOFRAN-ODT) disintegrating tablet 4 mg (4 mg Oral Given 06/06/20 1252)    ED Course  I have reviewed the triage vital signs and the nursing notes.  Pertinent labs & imaging results that were available during my care of the patient were reviewed by me and considered in my medical decision making (see chart for details).    MDM Rules/Calculators/A&P                          64 year old female with  right foot and shoulder pain after mechanical fall.  Fifth metatarsal fracture.  Ortho follow-up..  Pain medication.  Plain abdominal pain although abdominal exam is benign.  Alcohol abuse.  She can follow-up as an outpatient with resources for this.  Final Clinical Impression(s) / ED Diagnoses Final diagnoses:  Closed nondisplaced fracture of fifth metatarsal bone of left foot, initial encounter  Contusion of left  shoulder, initial encounter  Abdominal pain, unspecified abdominal location  Alcohol abuse    Rx / DC Orders ED Discharge Orders         Ordered    HYDROcodone-acetaminophen (NORCO/VICODIN) 5-325 MG tablet  Every 6 hours PRN        06/06/20 1342    pantoprazole (PROTONIX) 40 MG tablet  Daily,   Status:  Discontinued        06/06/20 1354    gabapentin (NEURONTIN) 300 MG capsule  3 times daily,   Status:  Discontinued        06/06/20 1354    gabapentin (NEURONTIN) 300 MG capsule  3 times daily        06/06/20 1355           Raeford Razor, MD 06/12/20 1016

## 2020-06-13 IMAGING — CR DG CHEST 2V
2 series · 2 of 2 positions shown · non-contrast
Comparison: 10/11/2018

CLINICAL DATA: Fall.  Seizure.

EXAM:
CHEST - 2 VIEW

[w chest lat]
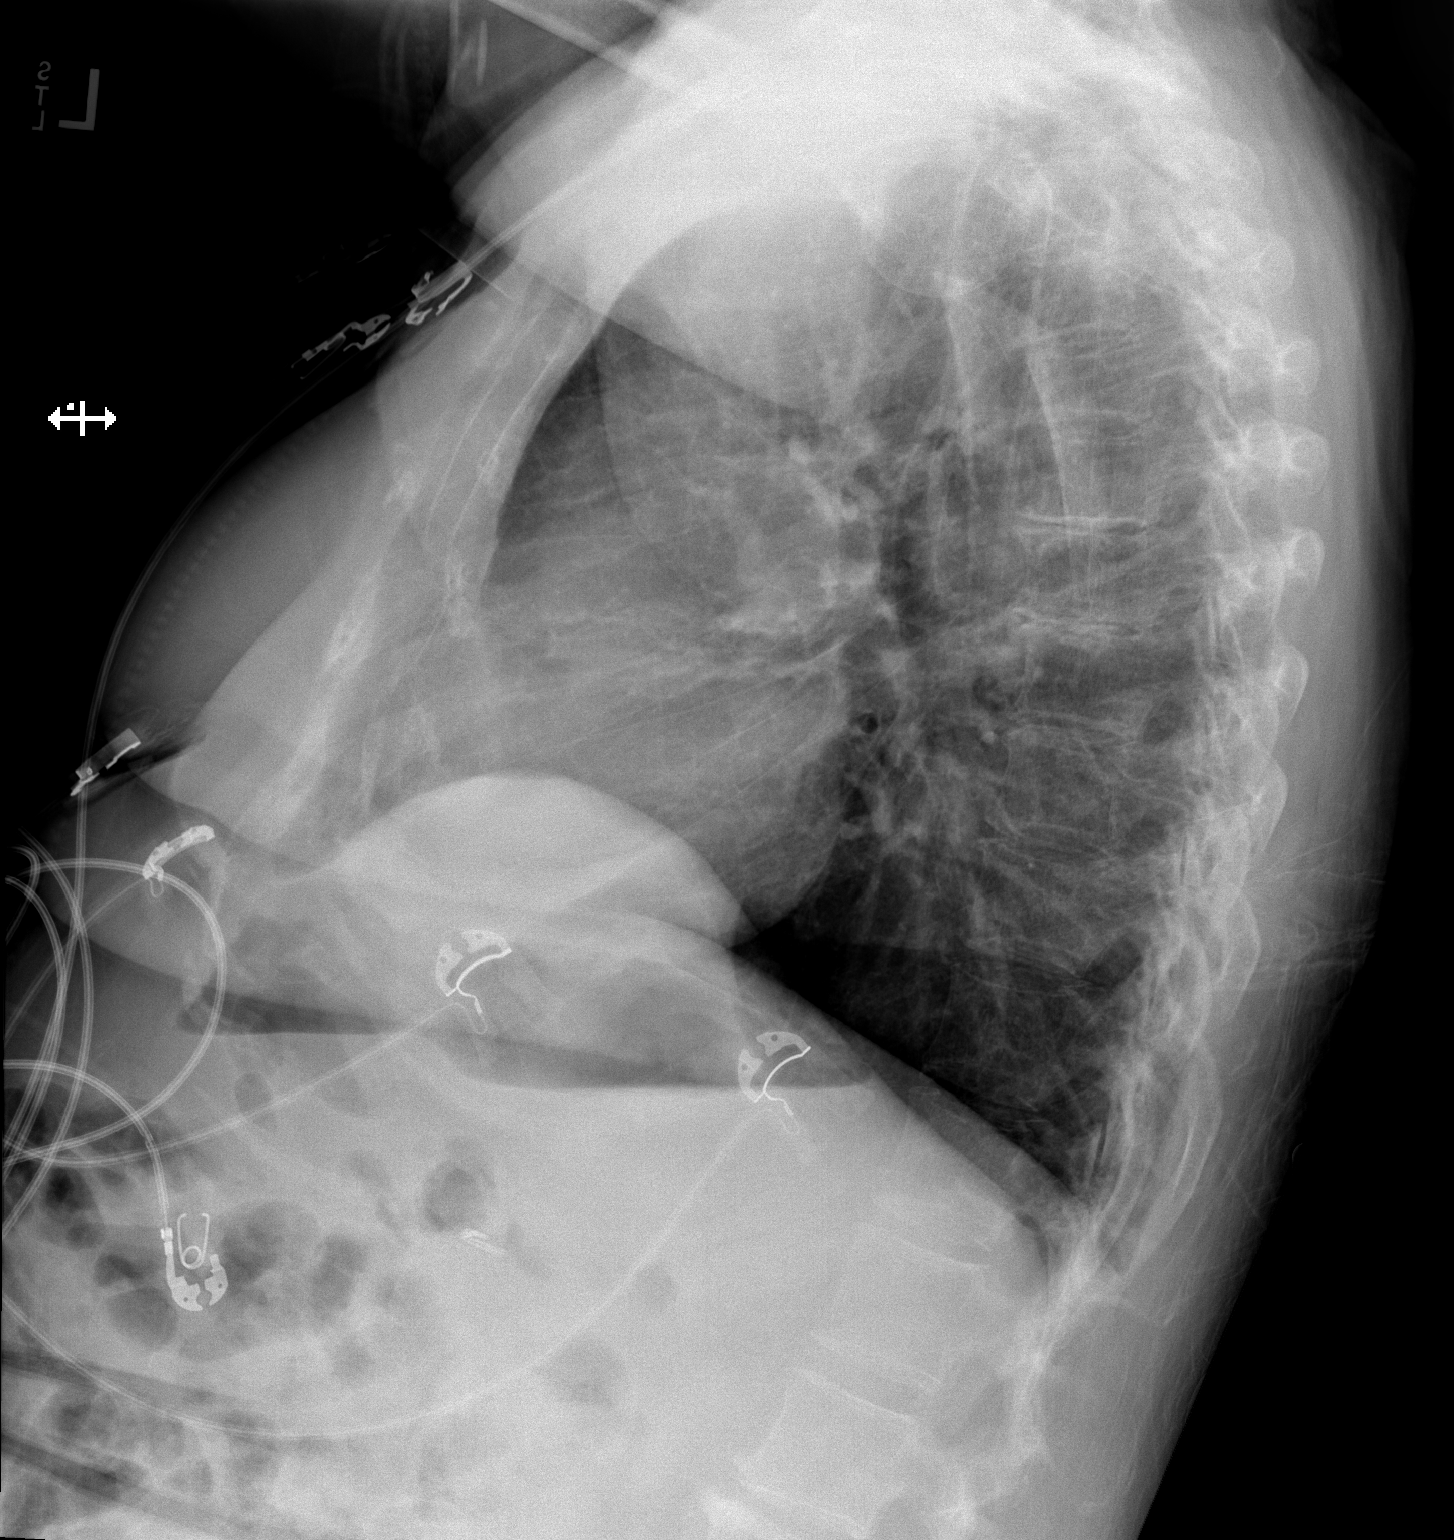

[x chest ap]
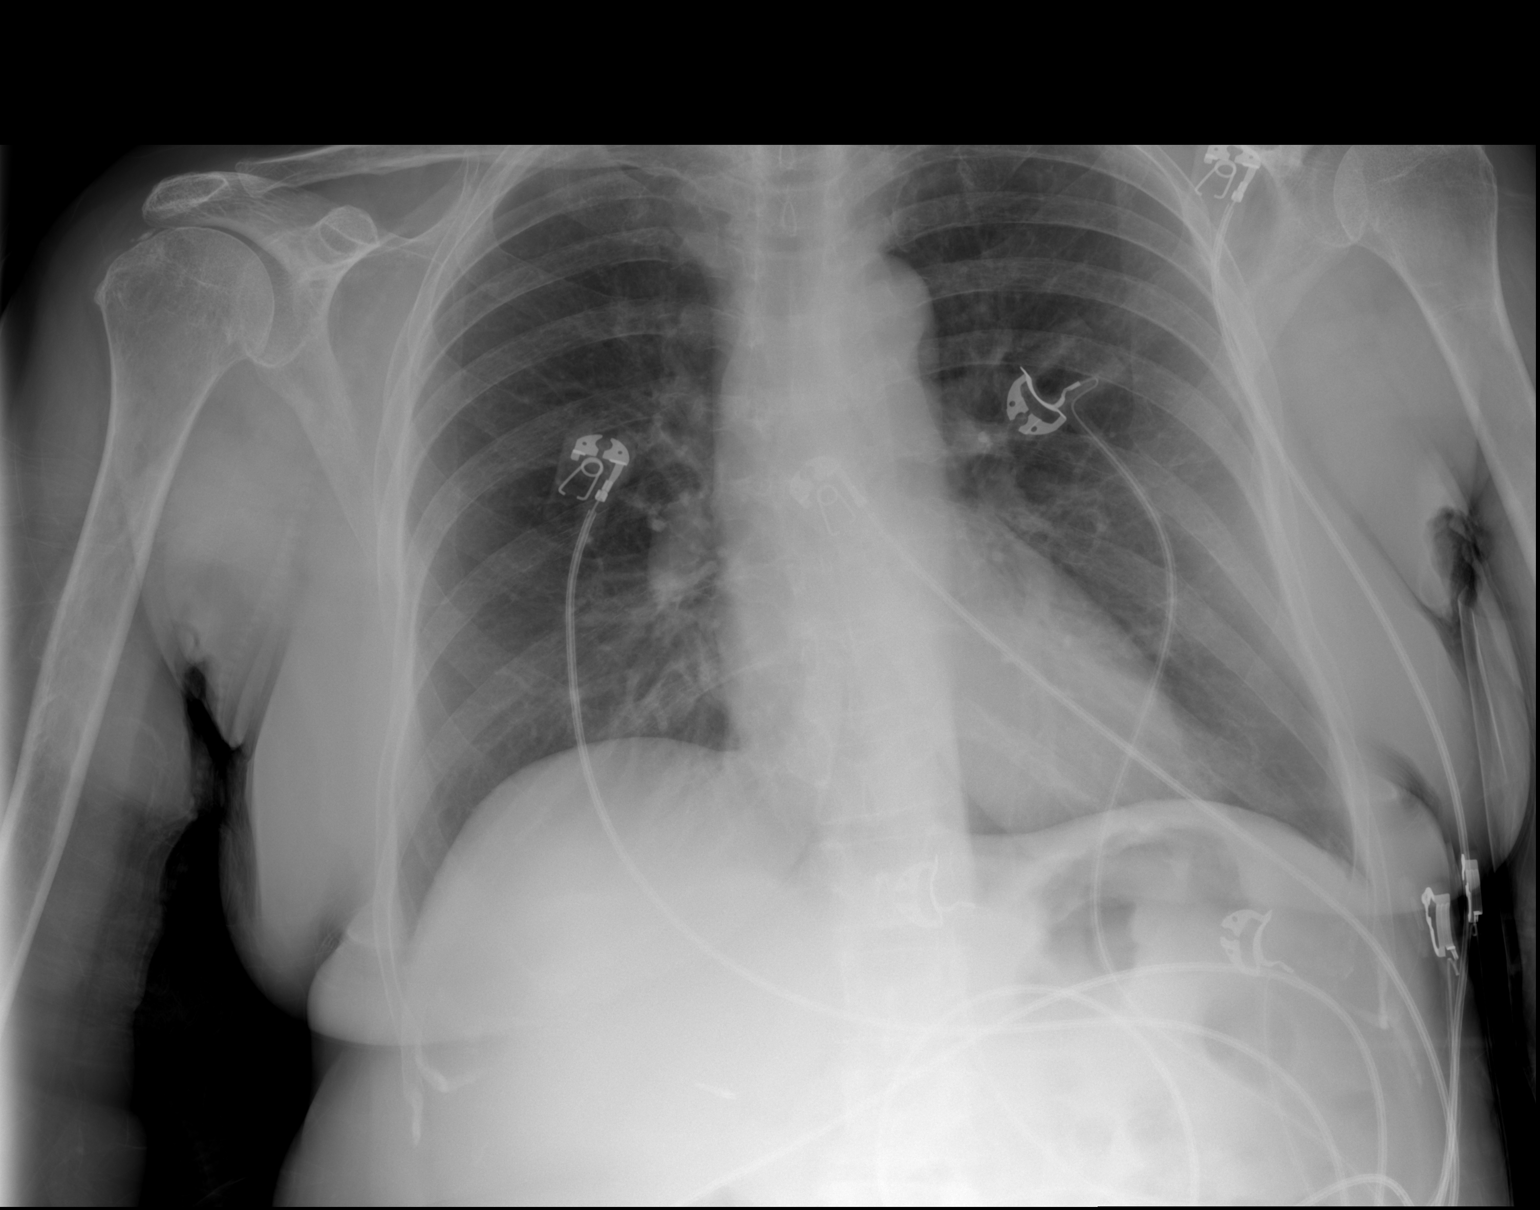

[2 of 2 positions shown; findings below may reference images not displayed]

FINDINGS: The lungs are clear without focal pneumonia, edema, pneumothorax or
pleural effusion. Interstitial markings are diffusely coarsened with
chronic features. The cardiopericardial silhouette is within normal
limits for size. The visualized bony structures of the thorax are
intact.
IMPRESSION: No active cardiopulmonary disease.

## 2020-06-18 IMAGING — CT CT HEAD W/O CM
3 series · 15 of 47 positions shown, 18 images · non-contrast
Comparison: 12/12/2018

CLINICAL DATA: Headache

EXAM:
CT HEAD WITHOUT CONTRAST
TECHNIQUE: Contiguous axial images were obtained from the base of the skull
through the vertex without intravenous contrast.

[Series 2: head wo · axial · 0.47mm/px · z∈[+1464,+1589]mm · 9 of 30 slices shown, 12 images]
[im 3/30  brain]
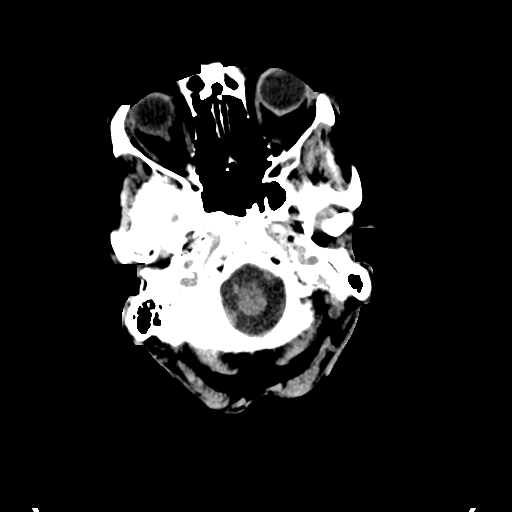
[im 3/30  bone]
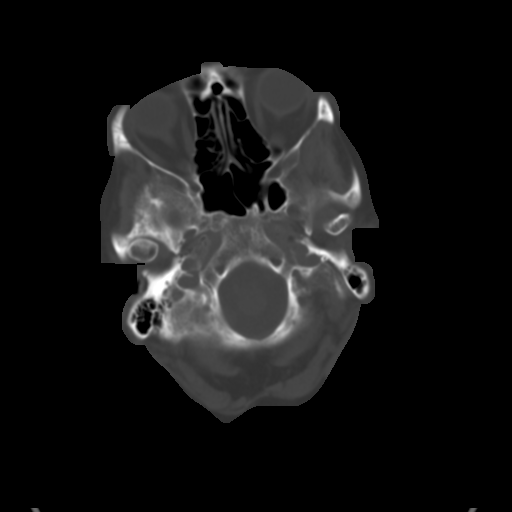
[im 6/30  brain]
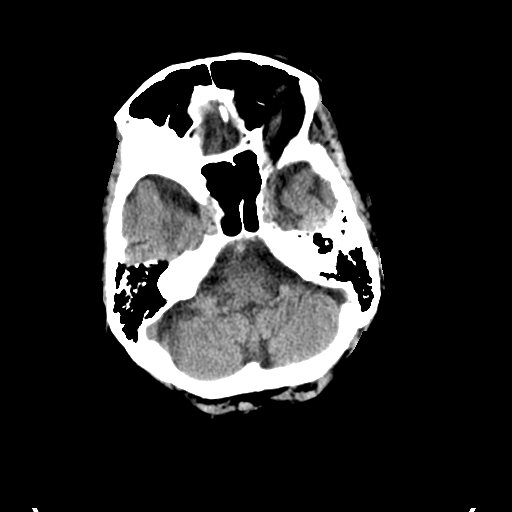
[im 9/30  brain]
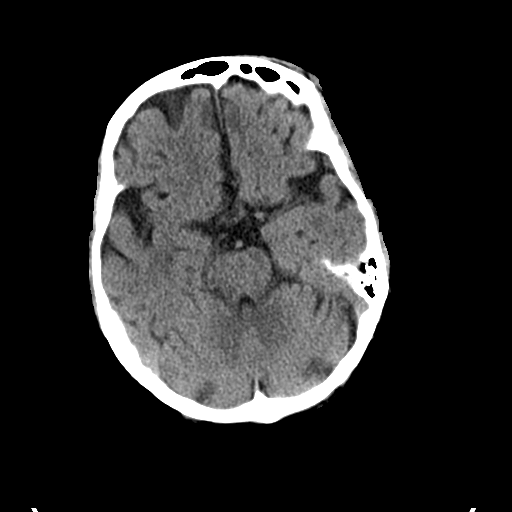
[im 12/30  brain]
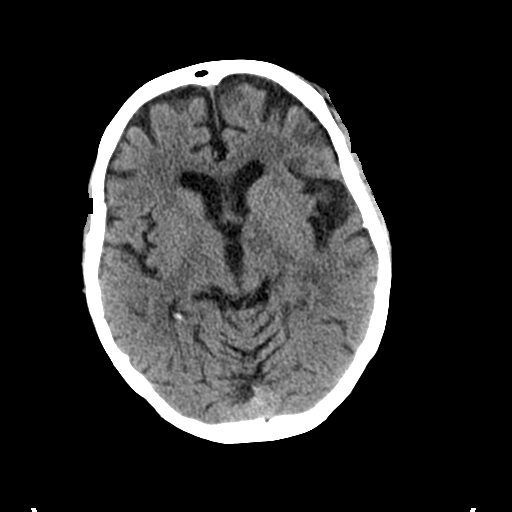
[im 16/30  brain]
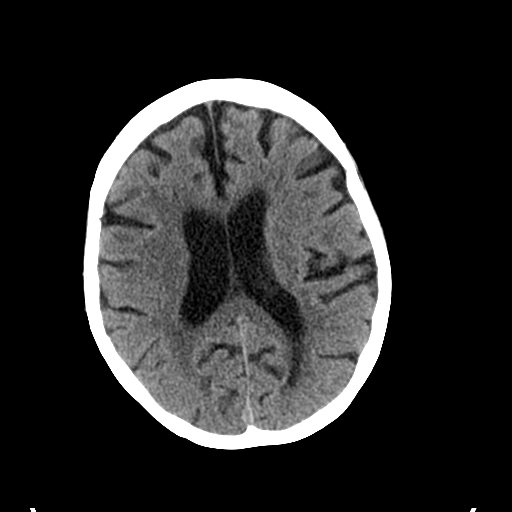
[im 16/30  bone]
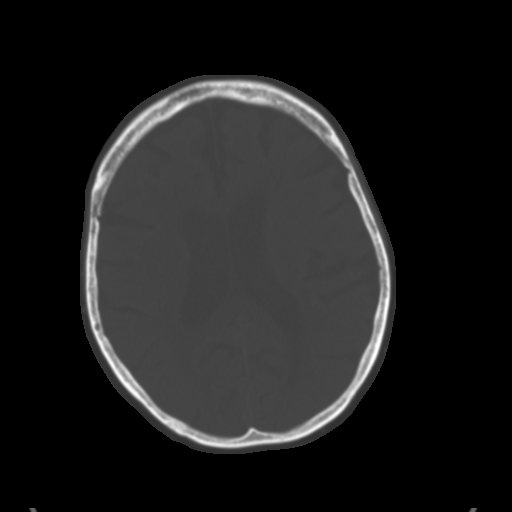
[im 19/30  brain]
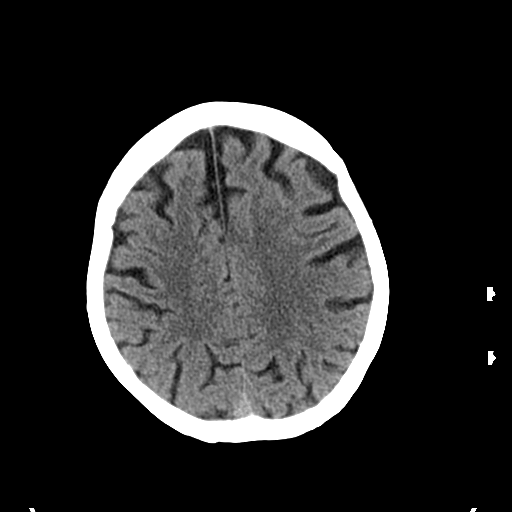
[im 22/30  brain]
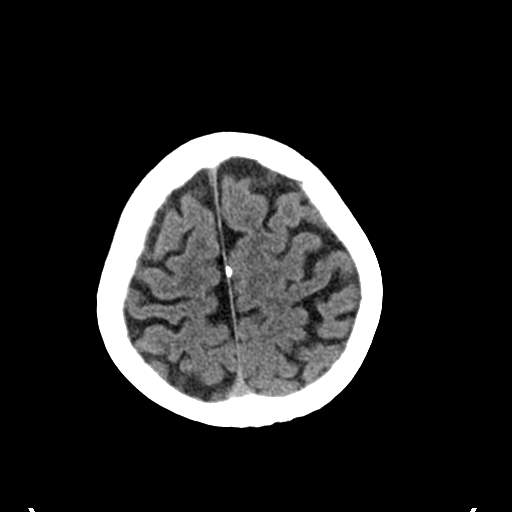
[im 25/30  brain]
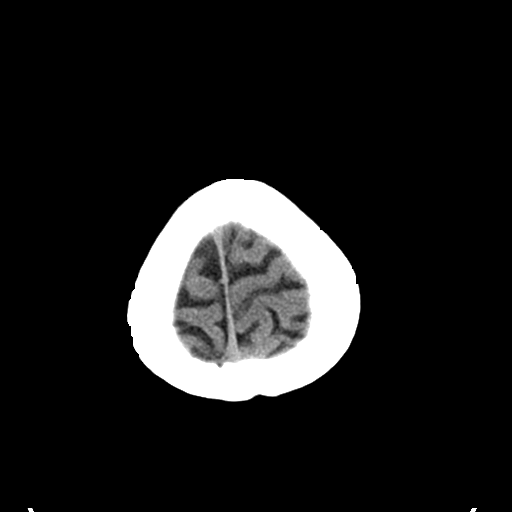
[im 28/30  brain]
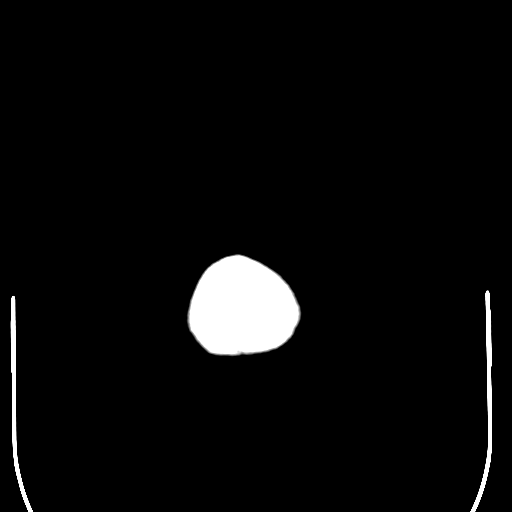
[im 28/30  bone]
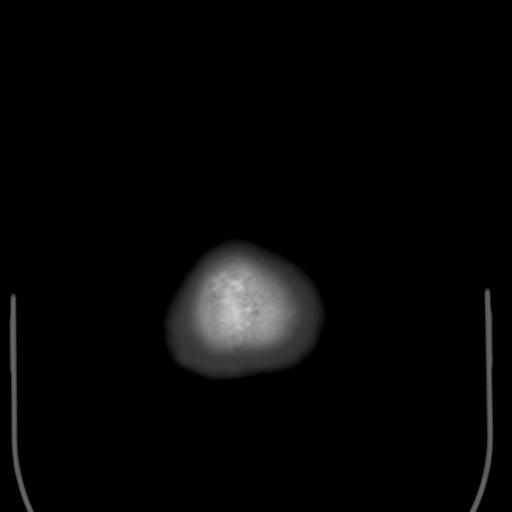

[Series 5: coronal soft tissue · coronal · 0.29mm/px · 3 of 69 slices shown]
[im 23/69  brain]
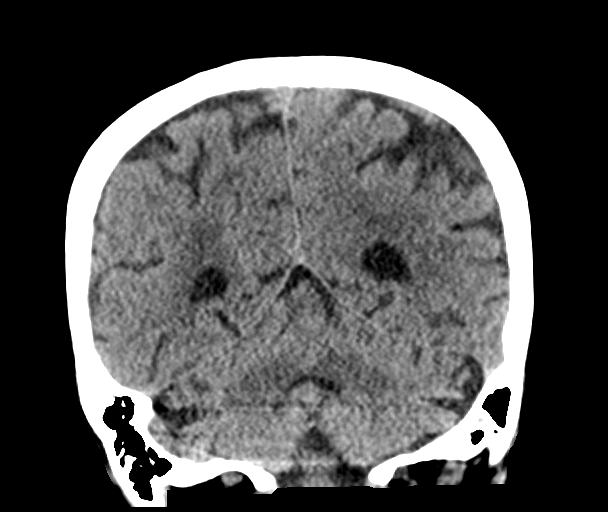
[im 31/69  brain]
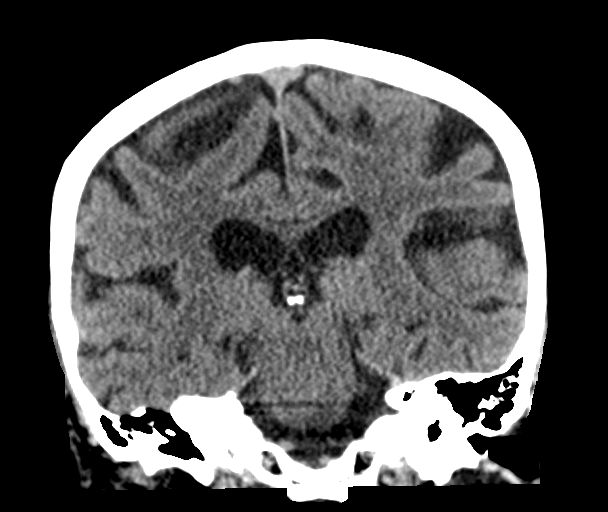
[im 38/69  brain]
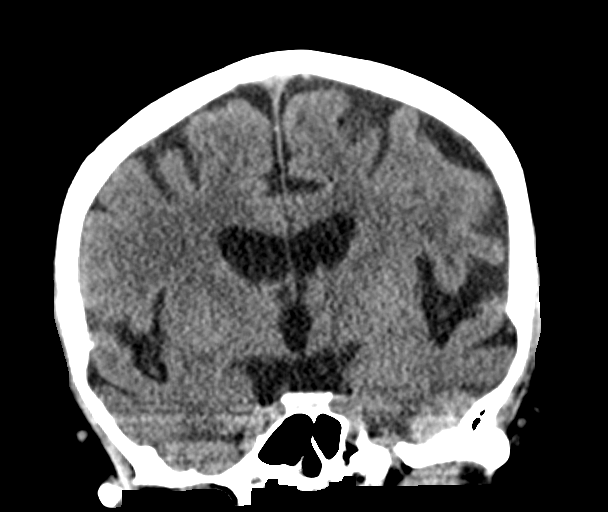

[Series 6: sagittal soft tissue · sagittal · 0.29mm/px · 3 of 53 slices shown]
[im 18/53  brain]
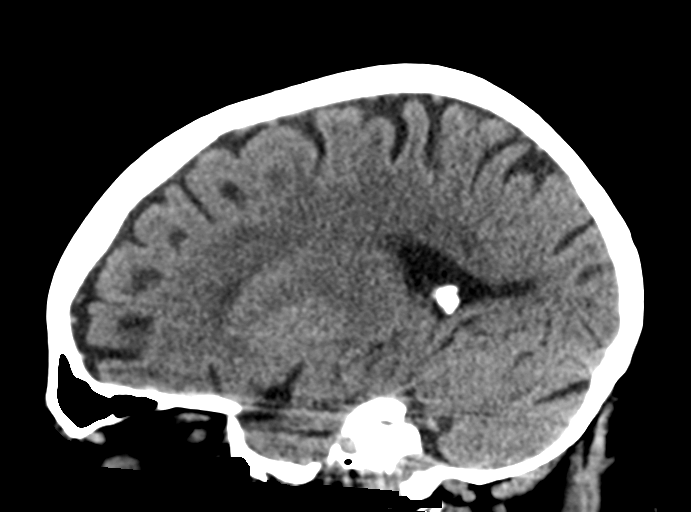
[im 27/53  brain]
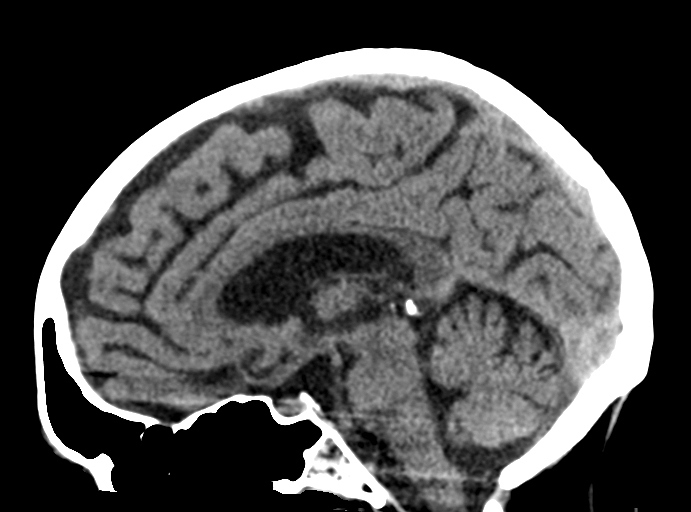
[im 35/53  brain]
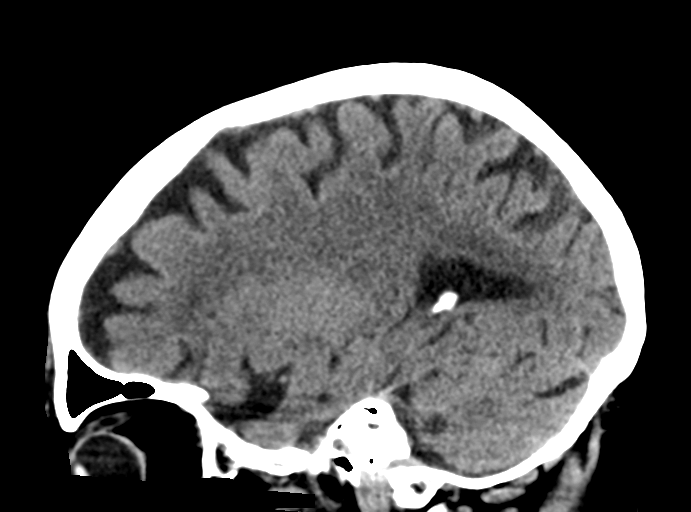

[15 of 47 positions shown; findings below may reference images not displayed]

FINDINGS: Brain: Previously seen small anterior left frontal intraparenchymal
hemorrhage again noted. No new hemorrhage, hydrocephalus or midline
shift. Mild cerebral atrophy.

Vascular: No hyperdense vessel or unexpected calcification.

Skull: No acute calvarial abnormality.

Sinuses/Orbits: Visualized paranasal sinuses and mastoids clear.
Orbital soft tissues unremarkable.

Other: None
IMPRESSION: Stable small left frontal intraparenchymal hematoma. No new
hemorrhage.

## 2020-07-14 ENCOUNTER — Encounter (HOSPITAL_COMMUNITY): Payer: Self-pay | Admitting: *Deleted

## 2020-07-14 ENCOUNTER — Ambulatory Visit (HOSPITAL_COMMUNITY)
Admission: EM | Admit: 2020-07-14 | Discharge: 2020-07-14 | Disposition: A | Discharge status: Home / Self Care | Payer: Medicaid Other

## 2020-07-14 ENCOUNTER — Other Ambulatory Visit: Payer: Self-pay

## 2020-07-14 DIAGNOSIS — S42492D Other displaced fracture of lower end of left humerus, subsequent encounter for fracture with routine healing: Secondary | ICD-10-CM

## 2020-07-14 DIAGNOSIS — M79602 Pain in left arm: Secondary | ICD-10-CM

## 2020-07-14 NOTE — ED Triage Notes (Signed)
PT reports Pain control needs. PT  Reports broke Rt arm at end of August. Pt had Rt pain on RT arm at [facility]. Pt reports pain 10/10 rt arm.

## 2020-07-14 NOTE — ED Provider Notes (Signed)
[facility]    CSN: [id number] Arrival date & time: 07/14/20  1114      History   Chief Complaint Chief Complaint  Patient presents with  . Arm Pain    HPI [name] is a 64 y.o. female.   Broke left arm about a month ago, had surgical repair 3 weeks ago through [facility] and since has had poorly controlled pain. Was on hydromorphone the first 2 weeks post-operatively, and at post-op appt with Surgeon 4 days ago was given norco and told that she would need to establish with Pain Mgmt for any further pain medication as it's been several weeks now since operation and incision has healed well without complication. Was then seen in the ED the following day for her pain and no new cause for pain was found - told to take norco given prior day. She states she cannot pick this script up because of her insurance and is here requesting pain medication. Takes high dose gabapentin and cymbalta daily for years for neuropathic pain and has tried tylenol and voltaren gel without benefit. States she's allergic to NSAIDs, tramadol and prednisone. Has not yet been able to establish with Pain Mgmt as she moved to [place] area recently. Denies fever, chills, drainage from wound, discoloration.      Past Medical History:  Diagnosis Date  . Alcohol dependence (HCC)   . Anxiety   . Chronic pain   . Depression   . Diverticulitis   . Herniated cervical disc   . Pancreatitis   . Seizures (HCC)    alcoholic seizures    Patient Active Problem List   Diagnosis Date Noted  . Diverticulitis 02/29/2020  . Acute diverticulitis 02/28/2020  . GERD (gastroesophageal reflux disease) 02/28/2020  . Suicidal ideation   . Delirium tremens (HCC) 12/13/2018  . Thrombocytopenia (HCC) 12/13/2018  . Homeless 12/13/2018  . Depression 12/13/2018  . Neuropathy 12/13/2018  . Subarachnoid hemorrhage (HCC) 12/11/2018  . Seizure due to alcohol withdrawal (HCC) 12/11/2018  . Alcohol dependence  with alcohol-induced mood disorder (HCC)   . 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 Major depressive disorder, 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    Past Surgical History:  Procedure Laterality Date  . ABDOMINAL HYSTERECTOMY    . ANKLE SURGERY Right   . CARPAL TUNNEL RELEASE Bilateral   . CERVICAL FUSION    . CHOLECYSTECTOMY    . KNEE SURGERY      OB History   No obstetric history on file.      Home Medications    Prior to Admission medications   Medication Sig Start Date End Date Taking? Authorizing Provider  DULoxetine (CYMBALTA) 30 MG capsule Take 1 capsule (30 mg total) by mouth daily. 06/11/20  Yes Money, [name], FNP  gabapentin (NEURONTIN) 300 MG capsule Take 1 capsule (300 mg total) by mouth 3 (three) times daily. 06/06/20  Yes Raeford Razor, MD  pantoprazole (PROTONIX) 40 MG tablet Take 1 tablet (40 mg total) by mouth daily. 08/04/19  Yes Lorelee New, PA-C  gabapentin (NEURONTIN) 800 MG tablet Take 800 mg by mouth 3 (three) times daily. 07/12/20   [provider]  HYDROcodone-acetaminophen (NORCO/VICODIN) 5-325 MG tablet Take 1 tablet by mouth every 6 (six) hours as needed. Patient not taking: Reported on 06/09/2020 06/06/20   Raeford Razor, MD  metoCLOPramide (REGLAN) 10 MG tablet Take 1 tablet (10 mg total) by mouth every 6 (six) hours as  needed for nausea (nausea/headache). 04/06/19 05/24/19  Mesner, Barbara Cower, MD    Family History History reviewed. No pertinent family history.  Social History Social History   Tobacco Use  . Smoking status: Current Every Day Smoker    Packs/day: 0.50    Years: 7.00    Pack years: 3.50    Types: Cigarettes  . Smokeless tobacco: Never Used  Vaping Use  . Vaping Use: Never used  Substance Use Topics  . Alcohol use: Yes    Comment: drinks a fifth of vodka daily  . Drug use: Not Currently    Types: Marijuana     Allergies   Cephalexin,  Ketorolac, Prednisone, and Tramadol   Review of Systems Review of Systems PER HPI    Physical Exam Triage Vital Signs ED Triage Vitals  Enc Vitals Group     BP 07/14/20 1305 (!) 164/103     Pulse Rate 07/14/20 1305 78     Resp 07/14/20 1305 16     Temp 07/14/20 1305 98.4 F (36.9 C)     Temp Source 07/14/20 1305 Oral     SpO2 --      Weight 07/14/20 1308 130 lb (59 kg)     Height 07/14/20 1308 5\' 1"  (1.549 m)     Head Circumference --      Peak Flow --      Pain Score 07/14/20 1307 10     Pain Loc --      Pain Edu? --      Excl. in GC? --    No data found.  Updated Vital Signs BP (!) 164/103 (BP Location: Left Arm)   Pulse 78   Temp 98.4 F (36.9 C) (Oral)   Resp 16   Ht 5\' 1"  (1.549 m)   Wt 130 lb (59 kg)   BMI 24.56 kg/m   Visual Acuity Right Eye Distance:   Left Eye Distance:   Bilateral Distance:    Right Eye Near:   Left Eye Near:    Bilateral Near:     Physical Exam Vitals and nursing note reviewed.  Constitutional:      Appearance: Normal appearance. She is not ill-appearing.  HENT:     Head: Atraumatic.     Mouth/Throat:     Mouth: Mucous membranes are moist.  Eyes:     Extraocular Movements: Extraocular movements intact.     Conjunctiva/sclera: Conjunctivae normal.  Cardiovascular:     Rate and Rhythm: Normal rate and regular rhythm.     Pulses: Normal pulses.     Heart sounds: Normal heart sounds.  Pulmonary:     Effort: Pulmonary effort is normal.     Breath sounds: Normal breath sounds.  Musculoskeletal:        General: No swelling.     Cervical back: Normal range of motion and neck supple.     Comments: Left wrist wrapped in ace wrap Surgical incision appears to be healing well, no edema, erythema, drainage at site Good ROM of fingers left hand  Skin:    General: Skin is warm and dry.     Coloration: Skin is not pale.     Findings: No erythema.  Neurological:     Mental Status: She is alert and oriented to person, place, and  time.     Sensory: No sensory deficit.  Psychiatric:        Thought Content: Thought content normal.        Judgment: Judgment normal.  Comments: agitated    UC Treatments / Results  Labs (all labs ordered are listed, but only abnormal results are displayed) Labs Reviewed - No data to display  EKG   Radiology No results found.  Procedures Procedures (including critical care time)  Medications Ordered in UC Medications - No data to display  Initial Impression / Assessment and Plan / UC Course  I have reviewed the triage vital signs and the nursing notes.  Pertinent labs & imaging results that were available during my care of the patient were reviewed by me and considered in my medical decision making (see chart for details).     Patient 3 weeks post-op at this point and no apparent complications with injury or procedure that should warrant her pain being this significantly poorly controlled. Reviewed ED chart, imaging and exam there stable as well. Discussed with patient to continue her current pain regimen and tylenol, RICE and call surgeon in the AM and see if they can transfer the script to her insurance's preferred pharmacy or make other pain recommendations while she awaits establishing with Pain Mgmt for ongoing pain control. Patient is very dissatisfied with this and leaves prior to discharge paperwork.   Final Clinical Impressions(s) / UC Diagnoses   Final diagnoses:  Closed bicondylar fracture of distal end of left humerus with routine healing, subsequent encounter  Left arm pain   Discharge Instructions   None    ED Prescriptions    None     I have reviewed the PDMP during this encounter.   Particia Nearing, [place] 07/14/20 2014

## 2020-07-24 ENCOUNTER — Other Ambulatory Visit: Payer: Self-pay | Admitting: Physician Assistant

## 2020-07-24 ENCOUNTER — Ambulatory Visit: Payer: Self-pay

## 2020-07-24 ENCOUNTER — Telehealth: Payer: Self-pay | Admitting: Physician Assistant

## 2020-07-24 ENCOUNTER — Telehealth: Payer: Self-pay

## 2020-07-24 ENCOUNTER — Telehealth: Payer: Self-pay | Admitting: *Deleted

## 2020-07-24 ENCOUNTER — Encounter: Payer: Self-pay | Admitting: Orthopaedic Surgery

## 2020-07-24 ENCOUNTER — Ambulatory Visit (INDEPENDENT_AMBULATORY_CARE_PROVIDER_SITE_OTHER): Payer: Medicaid Other | Admitting: Orthopaedic Surgery

## 2020-07-24 DIAGNOSIS — M79602 Pain in left arm: Secondary | ICD-10-CM

## 2020-07-24 MED ORDER — HYDROCODONE-ACETAMINOPHEN 5-325 MG PO TABS: 1.0000 | ORAL_TABLET | Freq: Every day | ORAL | 0 refills | Status: DC | PRN

## 2020-07-24 NOTE — Telephone Encounter (Signed)
Called [facility]. She has not picked up yet. Rx (hydrocodone) has been canceled.

## 2020-07-24 NOTE — Telephone Encounter (Signed)
See other message

## 2020-07-24 NOTE — Telephone Encounter (Signed)
Can someone please send this Rx to correct pharm. She has called several times. I did call her back to let her know yall are in the OR this PM.

## 2020-07-24 NOTE — Telephone Encounter (Signed)
[facility]  [facility] you [facility] to make sure she didn't already pick it up there as she specifically told  me to make sure and send there.

## 2020-07-24 NOTE — Telephone Encounter (Signed)
She specifically states [facility].  [facility] will you make sure [facility] not [facility]?

## 2020-07-24 NOTE — Telephone Encounter (Signed)
Pharmacy called regarding a Rx from an August 2021 visit.  RNCM advised not to fill Rx more than 71 days old.

## 2020-07-24 NOTE — Progress Notes (Signed)
Office Visit Note   Patient: [name]           Date of Birth: 1956/01/08           MRN: [id number] Visit Date: 07/24/2020              Requested by: [facility] [address] [place],  [place] [address] PCP: [facility]   Assessment & Plan: Visit Diagnoses:  1. Pain of left upper extremity     Plan: Impression is 4 weeks status post ORIF left humeral shaft fracture and radial nerve palsy.  At this point, we will reinitiate physical therapy and an internal prescription has been sent done.  We will also provide her with a removable wrist splint so that she does not develop a flexion contracture from her radial nerve palsy.  She will follow up with [place] in 2 weeks time for repeat evaluation and 2 view x-rays of the left humerus.  At that point, we will refer her to Dr. Alvester Morin for nerve conduction study.  I have agreed to call in 1 narcotic pain medicine prescription.  Follow-Up Instructions: Return in about 2 weeks (around 08/07/2020).   Orders:  Orders Placed This Encounter  Procedures  . XR Humerus Left  . XR Cervical Spine 2 or 3 views  . XR Forearm Left   No orders of the defined types were placed in this encounter.     Procedures: No procedures performed   Clinical Data: No additional findings.   Subjective: Chief Complaint  Patient presents with  . 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  Injury occurred 2 days prior and was treated operatively at [facility].  She has since moved to [facility] and is here to establish care.  She notes that she has had significant pain to the left upper extremity as well as weakness and paresthesias.  She was given 1 prescription of Norco postoperatively but has been taking Tylenol over the past few weeks.  She notes remote history of being established at [facility] pain  cl[facility] but was discharged about 1 year ago.  She has been to 1 physical therapy session and is currently wearing a glove and stockinette to her left hand.  She notes remote history of cervical spine surgery to include fusion at C4-5, C5-6 and C6-7 several years ago.  The left upper extremity weakness and paresthesias she is currently having started following the humeral shaft fracture.  Review of Systems as detailed in HPI.  All others reviewed and are negative.   Objective: Vital Signs: There were no vitals taken for this visit.  Physical Exam well-developed well-nourished female no acute distress.  Alert and oriented x3.  Ortho Exam examination of her left upper extremity reveals moderate tenderness to the fracture site.  She has moderate swelling to the hand.  She has had decreased sensation to the forearm and dorsum of the hand.  She has very minimal wrist extension.  Specialty Comments:  No specialty comments available.  Imaging: XR Cervical Spine 2 or 3 views  Result Date: 07/24/2020 X-rays demonstrate previous fusion C4-5, C5-6 and C6-7 with moderate degenerative disc disease above and below  XR Forearm Left  Result Date: 07/24/2020 No acute or structural abnormalities  XR Humerus Left  Result Date: 07/24/2020 X-rays demonstrate stable hardware and fracture  PMFS History: Patient Active Problem List   Diagnosis Date Noted  . Diverticulitis 02/29/2020  . Acute diverticulitis 02/28/2020  . GERD (gastroesophageal reflux disease) 02/28/2020  . Suicidal ideation   . Delirium tremens (HCC) 12/13/2018  . Thrombocytopenia (HCC) 12/13/2018  . Homeless 12/13/2018  . Depression 12/13/2018  . Neuropathy 12/13/2018  . Subarachnoid hemorrhage (HCC) 12/11/2018  . Seizure due to alcohol withdrawal (HCC) 12/11/2018  . Alcohol dependence with alcohol-induced mood disorder (HCC)   . 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 Major  depressive disorder, 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   Past Medical History:  Diagnosis Date  . Alcohol dependence (HCC)   . Anxiety   . Chronic pain   . Depression   . Diverticulitis   . Herniated cervical disc   . Pancreatitis   . Seizures (HCC)    alcoholic seizures    History reviewed. No pertinent family history.  Past Surgical History:  Procedure Laterality Date  . ABDOMINAL HYSTERECTOMY    . ANKLE SURGERY Right   . CARPAL TUNNEL RELEASE Bilateral   . CERVICAL FUSION    . CHOLECYSTECTOMY    . KNEE SURGERY     Social History   Occupational History  . Not on file  Tobacco Use  . Smoking status: Current Every Day Smoker    Packs/day: 0.50    Years: 7.00    Pack years: 3.50    Types: Cigarettes  . Smokeless tobacco: Never Used  Vaping Use  . Vaping Use: Never used  Substance and Sexual Activity  . Alcohol use: Yes    Comment: drinks a fifth of vodka daily  . Drug use: Not Currently    Types: Marijuana  . Sexual activity: Not Currently

## 2020-07-24 NOTE — Telephone Encounter (Signed)
Patient came in. She stated prescription for [facility] she [facility] [address] address is [address] Call back:[phone]

## 2020-07-24 NOTE — Telephone Encounter (Signed)
[facility]. Please call patient when [facility]. Patient phone number is [phone]. She does not want any medications to [place] on [address].

## 2020-07-24 NOTE — Telephone Encounter (Signed)
Patient called. Says the hydrocodone should had been sent to [id number] - [place], [place] - [address]

## 2020-07-24 NOTE — Telephone Encounter (Signed)
Can you send over to correct pharm. Thanks.

## 2020-07-25 NOTE — Telephone Encounter (Signed)
Ok, thanks.

## 2020-07-26 ENCOUNTER — Telehealth: Payer: Self-pay | Admitting: Orthopaedic Surgery

## 2020-07-26 ENCOUNTER — Telehealth: Payer: Self-pay | Admitting: Physician Assistant

## 2020-07-26 NOTE — Telephone Encounter (Signed)
Patient called. She would like a refill on hydrocodone. Says she will run out tomorrow. Her call back number is [phone].

## 2020-07-26 NOTE — Telephone Encounter (Signed)
See message.

## 2020-07-26 NOTE — Telephone Encounter (Signed)
Ok to call in tramadol if not history of seizures.   50mg  1 tas every 6 hours #30 no refills

## 2020-07-26 NOTE — Telephone Encounter (Signed)
See previous message

## 2020-07-26 NOTE — Telephone Encounter (Signed)
Patient called. Says she can not take tramadol and has 1 pain pill left. She would like a call back. [phone]

## 2020-07-27 ENCOUNTER — Other Ambulatory Visit: Payer: Self-pay

## 2020-07-27 ENCOUNTER — Emergency Department (HOSPITAL_COMMUNITY): Payer: Medicaid Other

## 2020-07-27 ENCOUNTER — Emergency Department (HOSPITAL_COMMUNITY)
Admission: EM | Admit: 2020-07-27 | Discharge: 2020-07-27 | Disposition: A | Discharge status: Home / Self Care | Payer: Medicaid Other | Attending: Emergency Medicine | Admitting: Emergency Medicine

## 2020-07-27 DIAGNOSIS — G8929 Other chronic pain: Secondary | ICD-10-CM | POA: Diagnosis not present

## 2020-07-27 DIAGNOSIS — Y92811 Bus as the place of occurrence of the external cause: Secondary | ICD-10-CM | POA: Insufficient documentation

## 2020-07-27 DIAGNOSIS — F17210 Nicotine dependence, cigarettes, uncomplicated: Secondary | ICD-10-CM | POA: Diagnosis not present

## 2020-07-27 DIAGNOSIS — W228XXA Striking against or struck by other objects, initial encounter: Secondary | ICD-10-CM | POA: Diagnosis not present

## 2020-07-27 DIAGNOSIS — M79602 Pain in left arm: Secondary | ICD-10-CM | POA: Insufficient documentation

## 2020-07-27 MED ORDER — DICLOFENAC SODIUM 1 % EX GEL: 2.0000 g | Freq: Four times a day (QID) | CUTANEOUS | 0 refills | Status: DC

## 2020-07-27 MED ORDER — HYDROCODONE-ACETAMINOPHEN 5-325 MG PO TABS
1.0000 | ORAL_TABLET | Freq: Once | ORAL | Status: AC
  Administered 2020-07-27: 1 via ORAL
  Filled 2020-07-27: qty 1

## 2020-07-27 NOTE — Discharge Instructions (Signed)
You can use the Voltaren gel as needed. Follow-up with your orthopedist and pain management provider. Return to the ER if you start to experience additional injuries, pain, swelling or fever.

## 2020-07-27 NOTE — ED Provider Notes (Signed)
[facility] EMERGENCY DEPARTMENT Provider Note   CSN: [id number] Arrival date & time: 07/27/20  0725     History Chief Complaint  Patient presents with   Arm Pain    [name] is a 64 y.o. female with a past medical history of chronic pain, alcohol abuse presenting to the ED with left arm pain.  Patient had surgery on left humerus for a humeral shaft fracture and radial nerve palsy status post ORIF 4 weeks ago.  Evaluated by orthopedic surgery 3 days ago.  Recommended physical therapy and was given medications for pain.  Reports ongoing pain.  States that she is trying to establish care with pain management.  Reports "I have chronic pain and it is really bad."  Reports ongoing weakness or numbness related to her radial nerve palsy.  Had an incident yesterday when she accidentally hit her left elbow on a chair in the bus as the bus driver started before she sat down.  HPI     Past Medical History:  Diagnosis Date   Alcohol dependence (HCC)    Anxiety    Chronic pain    Depression    Diverticulitis    Herniated cervical disc    Pancreatitis    Seizures (HCC)    alcoholic seizures    Patient Active Problem List   Diagnosis Date Noted   Diverticulitis 02/29/2020   Acute diverticulitis 02/28/2020   GERD (gastroesophageal reflux disease) 02/28/2020   Suicidal ideation    Delirium tremens (HCC) 12/13/2018   Thrombocytopenia (HCC) 12/13/2018   Homeless 12/13/2018   Depression 12/13/2018   Neuropathy 12/13/2018   Subarachnoid hemorrhage (HCC) 12/11/2018   Seizure due to alcohol withdrawal (HCC) 12/11/2018   Alcohol dependence with alcohol-induced mood disorder (HCC)    MDD (major depressive disorder), recurrent severe, without psychosis (HCC) 10/12/2018   Major depressive disorder, 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    Past Surgical History:  Procedure Laterality Date   ABDOMINAL HYSTERECTOMY      ANKLE SURGERY Right    CARPAL TUNNEL RELEASE Bilateral    CERVICAL FUSION     CHOLECYSTECTOMY     KNEE SURGERY       OB History   No obstetric history on file.     No family history on file.  Social History   Tobacco Use   Smoking status: Current Every Day Smoker    Packs/day: 0.50    Years: 7.00    Pack years: 3.50    Types: Cigarettes   Smokeless tobacco: Never Used  Vaping Use   Vaping Use: Never used  Substance Use Topics   Alcohol use: Yes    Comment: drinks a fifth of vodka daily   Drug use: Not Currently    Types: Marijuana    Home Medications Prior to Admission medications   Medication Sig Start Date End Date Taking? Authorizing Provider  DULoxetine (CYMBALTA) 30 MG capsule Take 1 capsule (30 mg total) by mouth daily. 06/11/20   Money, [name], FNP  gabapentin (NEURONTIN) 300 MG capsule Take 1 capsule (300 mg total) by mouth 3 (three) times daily. 06/06/20   Raeford Razor, MD  gabapentin (NEURONTIN) 800 MG tablet Take 800 mg by mouth 3 (three) times daily. 07/12/20   [provider]  HYDROcodone-acetaminophen (NORCO) 5-325 MG tablet Take 1-2 tablets by mouth daily as needed. 07/24/20   Cristie Hem, PA-C  pantoprazole (PROTONIX) 40 MG tablet Take 1 tablet (40 mg total) by mouth daily.  08/04/19   Lorelee New, PA-C  metoCLOPramide (REGLAN) 10 MG tablet Take 1 tablet (10 mg total) by mouth every 6 (six) hours as needed for nausea (nausea/headache). 04/06/19 05/24/19  Mesner, Barbara Cower, MD    Allergies    Cephalexin, Ketorolac, Prednisone, and Tramadol  Review of Systems   Review of Systems  Constitutional: Negative for chills and fever.  Musculoskeletal: Positive for arthralgias and myalgias.  Skin: Negative for wound.  Neurological: Positive for numbness.    Physical Exam Updated Vital Signs BP (!) 139/91    Pulse 85    Temp 98.1 F (36.7 C) (Oral)    Resp 18    Ht 5\' 1"  (1.549 m)    Wt 59 kg    SpO2 100%    BMI 24.56 kg/m   Physical  Exam Vitals and nursing note reviewed.  Constitutional:      General: She is not in acute distress.    Appearance: She is well-developed. She is not diaphoretic.  HENT:     Head: Normocephalic and atraumatic.  Eyes:     General: No scleral icterus.    Conjunctiva/sclera: Conjunctivae normal.  Pulmonary:     Effort: Pulmonary effort is normal. No respiratory distress.  Musculoskeletal:        General: Tenderness present.     Cervical back: Normal range of motion.     Comments: Tenderness to the left elbow with some swelling noted.  Reports decreased sensation to left forearm laterally.  Pain with extension of the wrist noted.  2+ radial pulse noted bilaterally.  Compartments are soft.  Full range of motion of bilateral elbows and shoulder.  Skin:    Findings: No rash.  Neurological:     Mental Status: She is alert.     Sensory: Sensory deficit present.     ED Results / Procedures / Treatments   Labs (all labs ordered are listed, but only abnormal results are displayed) Labs Reviewed - No data to display  EKG None  Radiology DG Elbow Complete Left  Result Date: 07/27/2020 CLINICAL DATA:  Injury to the left elbow with left arm pain. EXAM: LEFT ELBOW - COMPLETE 3+ VIEW COMPARISON:  July 24 2020 FINDINGS: Patient status post prior orthopedic fixation of the left humerus with orthopedic side plate fixated by horizontal screws. There is fracture line through the distal left humerus which is unchanged compared to prior exam of July 24, 2020. There is no dislocation. IMPRESSION: Fracture line through the distal left humerus which is unchanged compared to prior exam. No dislocation. Electronically Signed   By: July 26, 2020 M.D.   On: 07/27/2020 10:48    Procedures Procedures (including critical care time)  Medications Ordered in ED Medications  HYDROcodone-acetaminophen (NORCO/VICODIN) 5-325 MG per tablet 1 tablet (1 tablet Oral Given 07/27/20 1004)    ED Course  I have  reviewed the triage vital signs and the nursing notes.  Pertinent labs & imaging results that were available during my care of the patient were reviewed by me and considered in my medical decision making (see chart for details).    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  Evaluated by orthopedist 3 days ago and was given  a prescription for Norco #14 pills.  Had an incident yesterday where she hit her left elbow on a bus seat.  Reports worsening pain since then.  Physical exam findings consistent with radial nerve palsy with decreased sensation and movement although no changes based on her orthopedic note 3 days ago.  Equal and intact distal pulses bilaterally.  X-ray shows fracture line that is unchanged from prior exam, no acute findings.  Pain controlled here.  Told patient that I am unable to prescribe more opioid pain medication as she will need to follow-up with orthopedist and pain management about this.  Return precautions given.  All imaging, if done today, including plain films, CT scans, and ultrasounds, independently reviewed by me, and interpretations confirmed via formal radiology reads.  Patient is hemodynamically stable, in NAD, and able to ambulate in the ED. Evaluation does not show pathology that would require ongoing emergent intervention or inpatient treatment. I explained the diagnosis to the patient. Pain has been managed and has no complaints prior to discharge. Patient is comfortable with above plan and is stable for discharge at this time. All questions were answered prior to disposition. Strict return precautions for returning to the ED were discussed. Encouraged follow up with PCP.   An After Visit Summary was printed and given to the patient.   Portions of this note were generated with  Scientist, clinical (histocompatibility and immunogenetics). Dictation errors may occur despite best attempts at proofreading.  Final Clinical Impression(s) / ED Diagnoses Final diagnoses:  Chronic pain of left upper extremity    Rx / DC Orders ED Discharge Orders    None       Dietrich Pates, PA-C 07/27/20 1111    Wynetta Fines, MD 07/28/20 [phone]

## 2020-07-27 NOTE — ED Notes (Signed)
Patient /co pian left arm, states it has been hurting since her surgery 3 weeks ago, however is worse today and she can't see her PCP until Monday.

## 2020-07-27 NOTE — ED Triage Notes (Signed)
Pt here for eval of continued L arm pain post surgery at [facility]. Has pain management appt at [facility] on Monday, but need something to "get her through the weekend."

## 2020-07-29 ENCOUNTER — Other Ambulatory Visit: Payer: Self-pay | Admitting: Physician Assistant

## 2020-07-29 ENCOUNTER — Telehealth: Payer: Self-pay | Admitting: Orthopaedic Surgery

## 2020-07-29 MED ORDER — HYDROCODONE-ACETAMINOPHEN 5-325 MG PO TABS: 1.0000 | ORAL_TABLET | Freq: Every day | ORAL | 0 refills | Status: DC | PRN

## 2020-07-29 NOTE — Telephone Encounter (Signed)
Patient aware. She states she is going to pain clinic at the end of this month.

## 2020-07-29 NOTE — Telephone Encounter (Signed)
Patient called said she has chronic pain in her arm and asked if she can get something stronger for pain? Patient said she can not take being in such pain. Patient said she can not function at all. The number to contact patient is [phone]

## 2020-07-29 NOTE — Telephone Encounter (Signed)
Sent in one more norco rx.  We will need to tylenol 3 if she cannot take tramadol after this

## 2020-07-31 NOTE — Telephone Encounter (Signed)
See previous msg.

## 2020-08-01 ENCOUNTER — Telehealth: Payer: Self-pay | Admitting: Orthopaedic Surgery

## 2020-08-01 ENCOUNTER — Ambulatory Visit: Payer: Self-pay

## 2020-08-01 ENCOUNTER — Telehealth: Payer: Self-pay

## 2020-08-01 ENCOUNTER — Ambulatory Visit (INDEPENDENT_AMBULATORY_CARE_PROVIDER_SITE_OTHER): Payer: Medicaid Other | Admitting: Orthopaedic Surgery

## 2020-08-01 DIAGNOSIS — S42332A Displaced oblique fracture of shaft of humerus, left arm, initial encounter for closed fracture: Secondary | ICD-10-CM | POA: Insufficient documentation

## 2020-08-01 MED ORDER — GABAPENTIN 100 MG PO CAPS: 100.0000 mg | ORAL_CAPSULE | Freq: Three times a day (TID) | ORAL | 3 refills | Status: DC

## 2020-08-01 MED ORDER — OXYCODONE-ACETAMINOPHEN 5-325 MG PO TABS
1.0000 | ORAL_TABLET | Freq: Three times a day (TID) | ORAL | 0 refills | Status: DC | PRN
Start: 2020-08-01 — End: 2020-09-02

## 2020-08-01 NOTE — Telephone Encounter (Signed)
Left voicemail for Dareen Piano to call me back

## 2020-08-01 NOTE — Telephone Encounter (Signed)
Patient came in the office she is waiting for prescription to be sent to the pharmacy for hydrocodone. She stated [facility] doesn't feel comfortable refilling any pain meds for her.?? She stated Dr.Anderson would like a call from Dr.Xu.  Dr.Anderson:[phone]  Patient:[phone].

## 2020-08-01 NOTE — Telephone Encounter (Signed)
Patient called asking for an update about Dr. Roda Shutters to call pharmacy on file. Patient states pharmacist is waiting to release medication but cant until doctor approves. Please call patient at [phone].

## 2020-08-01 NOTE — Telephone Encounter (Signed)
See other msg.   Dr. Roda Shutters is waiting to speak to Dr. Dareen Piano first before approving more pain meds.

## 2020-08-01 NOTE — Progress Notes (Signed)
Office Visit Note   Patient: [name]           Date of Birth: 05-13-1956           MRN: [id number] Visit Date: 08/01/2020              Requested by: [facility] [address] [place],  [place] [address] PCP: [facility]   Assessment & Plan: Visit Diagnoses:  1. Closed displaced oblique fracture of shaft of left humerus, initial encounter     Plan: Overall I think this is a good sign that her radial nerve is recovering.  She will continue to wear her extension splint.  I made a referral to OT as well.  Prescription for Percocet and Neurontin.  She has an upcoming appointment with the pain clinic in about 10 days.  Follow-up in 4 weeks with two-view x-rays of the left humerus.  Follow-Up Instructions: Return in about 4 weeks (around 08/29/2020).   Orders:  Orders Placed This Encounter  Procedures  . XR Humerus Left  . Ambulatory referral to Occupational Therapy   Meds ordered this encounter  Medications  . gabapentin (NEURONTIN) 100 MG capsule    Sig: Take 1 capsule (100 mg total) by mouth 3 (three) times daily.    Dispense:  30 capsule    Refill:  3  . oxyCODONE-acetaminophen (PERCOCET) 5-325 MG tablet    Sig: Take 1 tablet by mouth every 8 (eight) hours as needed for severe pain.    Dispense:  30 tablet    Refill:  0      Procedures: No procedures performed   Clinical Data: No additional findings.   Subjective: No chief complaint on file.   Philisha returns today for her 1 month follow-up status post ORIF of left humerus fracture with radial nerve palsy at [facility].  She reports shooting numbing pain down into the fingertips.  She has noticed that she is able to extend her wrist.   Review of Systems  Constitutional: Negative.   HENT: Negative.   Eyes: Negative.   Respiratory: Negative.   Cardiovascular: Negative.   Endocrine: Negative.   Musculoskeletal: Negative.   Neurological:  Negative.   Hematological: Negative.   Psychiatric/Behavioral: Negative.   All other systems reviewed and are negative.    Objective: Vital Signs: There were no vitals taken for this visit.  Physical Exam Vitals and nursing note reviewed.  Constitutional:      Appearance: She is well-developed.  Pulmonary:     Effort: Pulmonary effort is normal.  Skin:    General: Skin is warm.     Capillary Refill: Capillary refill takes less than 2 seconds.  Neurological:     Mental Status: She is alert and oriented to person, place, and time.  Psychiatric:        Behavior: Behavior normal.        Thought Content: Thought content normal.        Judgment: Judgment normal.     Ortho Exam Surgical scar is fully healed.  She has sensation to the radial nerve distribution.  Wrist extensor intact.  EIP and EPL are not intact. Specialty Comments:  No specialty comments available.  Imaging: XR Humerus Left  Result Date: 08/01/2020 Stable fixation of humeral shaft fracture without complication.    PMFS History: Patient Active Problem List   Diagnosis Date Noted  . Closed displaced oblique fracture of shaft of left humerus 08/01/2020  .  Diverticulitis 02/29/2020  . Acute diverticulitis 02/28/2020  . GERD (gastroesophageal reflux disease) 02/28/2020  . Suicidal ideation   . Delirium tremens (HCC) 12/13/2018  . Thrombocytopenia (HCC) 12/13/2018  . Homeless 12/13/2018  . Depression 12/13/2018  . Neuropathy 12/13/2018  . Subarachnoid hemorrhage (HCC) 12/11/2018  . Seizure due to alcohol withdrawal (HCC) 12/11/2018  . Alcohol dependence with alcohol-induced mood disorder (HCC)   . 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 Major depressive disorder, 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   Past Medical History:  Diagnosis Date  . Alcohol dependence (HCC)   . Anxiety   . Chronic pain   . Depression   .  Diverticulitis   . Herniated cervical disc   . Pancreatitis   . Seizures (HCC)    alcoholic seizures    No family history on file.  Past Surgical History:  Procedure Laterality Date  . ABDOMINAL HYSTERECTOMY    . ANKLE SURGERY Right   . CARPAL TUNNEL RELEASE Bilateral   . CERVICAL FUSION    . CHOLECYSTECTOMY    . KNEE SURGERY     Social History   Occupational History  . Not on file  Tobacco Use  . Smoking status: Current Every Day Smoker    Packs/day: 0.50    Years: 7.00    Pack years: 3.50    Types: Cigarettes  . Smokeless tobacco: Never Used  Vaping Use  . Vaping Use: Never used  Substance and Sexual Activity  . Alcohol use: Yes    Comment: drinks a fifth of vodka daily  . Drug use: Not Currently    Types: Marijuana  . Sexual activity: Not Currently

## 2020-08-01 NOTE — Telephone Encounter (Signed)
Pending.... Dr Roda Shutters called Dr. Dareen Piano no answer LMOM.

## 2020-08-01 NOTE — Telephone Encounter (Signed)
Patient called in saying that medicare is asking why did dr Roda Shutters increase her medication . Medicare is not wanting to refill until they speak with dr Roda Shutters

## 2020-08-07 ENCOUNTER — Ambulatory Visit: Payer: Medicaid Other | Admitting: Orthopaedic Surgery

## 2020-08-09 ENCOUNTER — Telehealth: Payer: Self-pay | Admitting: Orthopaedic Surgery

## 2020-08-09 NOTE — Telephone Encounter (Signed)
Patient called about an updated of refill sent to pharmacy. Please send to pharmacy on file. Patient phone number is [phone].

## 2020-08-09 NOTE — Telephone Encounter (Signed)
See other msg

## 2020-08-09 NOTE — Telephone Encounter (Signed)
Patient called. Would like a RX for oxycodone sent to [facility] on [address]. Her call back number is [phone]

## 2020-08-12 ENCOUNTER — Other Ambulatory Visit: Payer: Self-pay | Admitting: Physician Assistant

## 2020-08-12 MED ORDER — HYDROCODONE-ACETAMINOPHEN 5-325 MG PO TABS: 1.0000 | ORAL_TABLET | Freq: Every day | ORAL | 0 refills | Status: DC | PRN

## 2020-08-12 NOTE — Telephone Encounter (Signed)
Sent in norco

## 2020-08-13 ENCOUNTER — Telehealth: Payer: Self-pay | Admitting: Orthopaedic Surgery

## 2020-08-13 NOTE — Telephone Encounter (Signed)
Patient called advised she has not been scheduled to the pain clinic yet. Dr Ewing Schlein is suppose to see her. Patient asked if Dr Roda Shutters will prescribe her pain medicine until she goes to the pain clinic? The number to contact patient is  [phone]

## 2020-08-14 ENCOUNTER — Other Ambulatory Visit: Payer: Self-pay

## 2020-08-14 DIAGNOSIS — S42332A Displaced oblique fracture of shaft of humerus, left arm, initial encounter for closed fracture: Secondary | ICD-10-CM

## 2020-08-15 ENCOUNTER — Other Ambulatory Visit: Payer: Self-pay | Admitting: Physician Assistant

## 2020-08-15 NOTE — Telephone Encounter (Signed)
Can do tramadol but nothing stronger

## 2020-08-15 NOTE — Telephone Encounter (Signed)
FYI  Advised patient. States she is allergic to Tramadol. She states she is in so much pain. Her PCP won't give her any pain meds either. I did advise her she can go to urgent care or ED if she's in so much pain. She also states she  doesn't know when she will get into pain clinic with Dr Cristela Felt. I did advise her to call to check status. We also put in our own Pain clinic referral which is pending approval.

## 2020-08-16 ENCOUNTER — Telehealth: Payer: Self-pay

## 2020-08-16 ENCOUNTER — Other Ambulatory Visit: Payer: Self-pay

## 2020-08-16 MED ORDER — ACETAMINOPHEN-CODEINE #3 300-30 MG PO TABS: ORAL_TABLET | ORAL | 0 refills | Status: DC

## 2020-08-16 NOTE — Telephone Encounter (Signed)
Can you send this in 1 every 6-8 hours #30 no refills

## 2020-08-16 NOTE — Telephone Encounter (Signed)
See other msg

## 2020-08-16 NOTE — Telephone Encounter (Signed)
Called into pharm  

## 2020-08-16 NOTE — Telephone Encounter (Signed)
Called patient no answer LMOM.

## 2020-08-16 NOTE — Telephone Encounter (Signed)
PA DONE    Confirmation #:[id number] WPrior Approval [id number] Status:APPROVED.

## 2020-08-16 NOTE — Telephone Encounter (Signed)
We can try tylenol #3, but if she cannot do this either, she will have to wait for pain clinic

## 2020-08-16 NOTE — Telephone Encounter (Signed)
Patient called she is requesting rx refill I advised her last message and she is okay with taking tylenol #3 she would like rx to be sent in today if possible. call back:[phone]

## 2020-08-19 ENCOUNTER — Telehealth: Payer: Self-pay

## 2020-08-19 ENCOUNTER — Telehealth: Payer: Self-pay | Admitting: Orthopaedic Surgery

## 2020-08-19 NOTE — Telephone Encounter (Signed)
See other msg

## 2020-08-19 NOTE — Telephone Encounter (Signed)
Patient called and is asking for her pain management referral be sent to [facility] in [place]

## 2020-08-19 NOTE — Telephone Encounter (Signed)
I agree.  No pain killers are indicated at this time.

## 2020-08-19 NOTE — Telephone Encounter (Signed)
Patient called in wanting prescription . Says she also would like a ref to be seen at pain management center

## 2020-08-19 NOTE — Telephone Encounter (Signed)
Pt called asking if Dr. Roda Shutters would send in a rx for hydrocodone because she is waiting on a appt and she hasn't been able to get into pain management and her pain levels continue to rise. Pt states she would like a supply for 30 days 3x a day   [phone]

## 2020-08-19 NOTE — Telephone Encounter (Signed)
I've advised patient multiple times-unable to fill any narcotics. She is allergic to Tramadol. Last Rx was written for Tylenol # 3 10/29. She is awaiting pain clinic appt. (takes Approx. 6-8 weeks for review). She calls everyday-multiples times a day asking for narcotics.    Can someone please call her as I've spoken to her multiple times trying to advise this and she doesn't understand!

## 2020-08-20 NOTE — Telephone Encounter (Signed)
Lindsey spoke to patient  

## 2020-08-20 NOTE — Telephone Encounter (Signed)
Left vm to call me back

## 2020-08-21 ENCOUNTER — Telehealth: Payer: Self-pay | Admitting: Orthopaedic Surgery

## 2020-08-21 NOTE — Telephone Encounter (Signed)
Please advise 

## 2020-08-21 NOTE — Telephone Encounter (Signed)
Patient called asking if Dr. Roda Shutters can prescribe her tylenol 3 until Nov 11th which is her appt with pain management. Patient is asking for pre authorization too when and if medication is called in. Please call patient at [phone].

## 2020-08-22 NOTE — Telephone Encounter (Signed)
Unfortunately, as we discussed over the phone, we cannot do this.  Only over the counter medication at this point or I can call in an nsaid

## 2020-08-23 NOTE — Telephone Encounter (Signed)
I called pt and advised of message below. She voiced understanding and will call with any other questions.

## 2020-08-29 ENCOUNTER — Ambulatory Visit: Payer: Medicaid Other | Admitting: Orthopaedic Surgery

## 2020-09-01 ENCOUNTER — Encounter (HOSPITAL_COMMUNITY): Payer: Self-pay | Admitting: Obstetrics and Gynecology

## 2020-09-01 ENCOUNTER — Emergency Department (HOSPITAL_COMMUNITY)
Admission: EM | Admit: 2020-09-01 | Discharge: 2020-09-02 | Disposition: A | Discharge status: Other Acute Inpatient Hospital | Payer: Medicaid Other | Attending: Emergency Medicine | Admitting: Emergency Medicine

## 2020-09-01 ENCOUNTER — Emergency Department (HOSPITAL_COMMUNITY): Payer: Medicaid Other

## 2020-09-01 ENCOUNTER — Other Ambulatory Visit: Payer: Self-pay

## 2020-09-01 DIAGNOSIS — E860 Dehydration: Secondary | ICD-10-CM | POA: Insufficient documentation

## 2020-09-01 DIAGNOSIS — F332 Major depressive disorder, 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 psychotic features: Secondary | ICD-10-CM | POA: Insufficient documentation

## 2020-09-01 DIAGNOSIS — Z5900 Homelessness unspecified: Secondary | ICD-10-CM | POA: Insufficient documentation

## 2020-09-01 DIAGNOSIS — Z20822 Contact with and (suspected) exposure to covid-19: Secondary | ICD-10-CM | POA: Diagnosis not present

## 2020-09-01 DIAGNOSIS — M79602 Pain in left arm: Secondary | ICD-10-CM | POA: Diagnosis not present

## 2020-09-01 DIAGNOSIS — F17210 Nicotine dependence, cigarettes, uncomplicated: Secondary | ICD-10-CM | POA: Diagnosis not present

## 2020-09-01 DIAGNOSIS — R45851 Suicidal ideations: Secondary | ICD-10-CM | POA: Diagnosis not present

## 2020-09-01 DIAGNOSIS — R197 Diarrhea, unspecified: Secondary | ICD-10-CM | POA: Diagnosis not present

## 2020-09-01 DIAGNOSIS — F4310 Post-traumatic stress disorder, unspecified: Secondary | ICD-10-CM | POA: Diagnosis not present

## 2020-09-01 DIAGNOSIS — F10920 Alcohol use, unspecified with intoxication, uncomplicated: Secondary | ICD-10-CM

## 2020-09-01 DIAGNOSIS — K219 Gastro-esophageal reflux disease without esophagitis: Secondary | ICD-10-CM | POA: Diagnosis not present

## 2020-09-01 DIAGNOSIS — Y906 Blood alcohol level of 120-199 mg/100 ml: Secondary | ICD-10-CM | POA: Insufficient documentation

## 2020-09-01 DIAGNOSIS — R1110 Vomiting, unspecified: Secondary | ICD-10-CM | POA: Insufficient documentation

## 2020-09-01 DIAGNOSIS — F10229 Alcohol dependence with intoxication, unspecified: Secondary | ICD-10-CM | POA: Diagnosis not present

## 2020-09-01 DIAGNOSIS — R1013 Epigastric pain: Secondary | ICD-10-CM | POA: Insufficient documentation

## 2020-09-01 DIAGNOSIS — F1210 Cannabis abuse, uncomplicated: Secondary | ICD-10-CM

## 2020-09-01 LAB — LIPASE, BLOOD: Lipase: 25 U/L (ref 11–51)

## 2020-09-01 LAB — URINALYSIS, ROUTINE W REFLEX MICROSCOPIC
Bilirubin Urine: NEGATIVE
Glucose, UA: NEGATIVE mg/dL
Hgb urine dipstick: NEGATIVE
Ketones, ur: 5 mg/dL — AB
Leukocytes,Ua: NEGATIVE
Nitrite: NEGATIVE
Protein, ur: NEGATIVE mg/dL
Specific Gravity, Urine: 1.026 (ref 1.005–1.030)
pH: 5 (ref 5.0–8.0)

## 2020-09-01 LAB — CBC
HCT: 42.1 % (ref 36.0–46.0)
Hemoglobin: 14.1 g/dL (ref 12.0–15.0)
MCH: 31.8 pg (ref 26.0–34.0)
MCHC: 33.5 g/dL (ref 30.0–36.0)
MCV: 95 fL (ref 80.0–100.0)
Platelets: 318 10*3/uL (ref 150–400)
RBC: 4.43 MIL/uL (ref 3.87–5.11)
RDW: 13.3 % (ref 11.5–15.5)
WBC: 12.3 10*3/uL — ABNORMAL HIGH (ref 4.0–10.5)
nRBC: 0 % (ref 0.0–0.2)

## 2020-09-01 LAB — COMPREHENSIVE METABOLIC PANEL
ALT: 36 U/L (ref 0–44)
AST: 54 U/L — ABNORMAL HIGH (ref 15–41)
Albumin: 4.4 g/dL (ref 3.5–5.0)
Alkaline Phosphatase: 93 U/L (ref 38–126)
Anion gap: 20 — ABNORMAL HIGH (ref 5–15)
BUN: 21 mg/dL (ref 8–23)
CO2: 16 mmol/L — ABNORMAL LOW (ref 22–32)
Calcium: 8.9 mg/dL (ref 8.9–10.3)
Chloride: 100 mmol/L (ref 98–111)
Creatinine, Ser: 0.78 mg/dL (ref 0.44–1.00)
GFR, Estimated: >60 mL/min (ref >60)
Glucose, Bld: 175 mg/dL — ABNORMAL HIGH (ref 70–99)
Potassium: 3.1 mmol/L — ABNORMAL LOW (ref 3.5–5.1)
Sodium: 136 mmol/L (ref 135–145)
Total Bilirubin: 0.5 mg/dL (ref 0.3–1.2)
Total Protein: 7.3 g/dL (ref 6.5–8.1)

## 2020-09-01 LAB — ETHANOL: Alcohol, Ethyl (B): 253 mg/dL — ABNORMAL HIGH (ref <10)

## 2020-09-01 MED ORDER — LORAZEPAM 1 MG PO TABS: 0.0000 mg | ORAL_TABLET | Freq: Two times a day (BID) | ORAL | Status: DC

## 2020-09-01 MED ORDER — SODIUM CHLORIDE 0.9 % IV BOLUS
2000.0000 mL | Freq: Once | INTRAVENOUS | Status: AC
  Administered 2020-09-01: 2000 mL via INTRAVENOUS

## 2020-09-01 MED ORDER — FENTANYL CITRATE (PF) 100 MCG/2ML IJ SOLN
50.0000 ug | Freq: Once | INTRAMUSCULAR | Status: AC
  Administered 2020-09-01: 50 ug via INTRAVENOUS
  Filled 2020-09-01: qty 2

## 2020-09-01 MED ORDER — ACETAMINOPHEN 325 MG PO TABS: 650.0000 mg | ORAL_TABLET | ORAL | Status: DC | PRN

## 2020-09-01 MED ORDER — DULOXETINE HCL 30 MG PO CPEP: 30.0000 mg | ORAL_CAPSULE | Freq: Every day | ORAL | Status: DC

## 2020-09-01 MED ORDER — POTASSIUM CHLORIDE CRYS ER 20 MEQ PO TBCR
40.0000 meq | EXTENDED_RELEASE_TABLET | Freq: Once | ORAL | Status: AC
  Administered 2020-09-01: 40 meq via ORAL
  Filled 2020-09-01: qty 2

## 2020-09-01 MED ORDER — THIAMINE HCL 100 MG/ML IJ SOLN: 100.0000 mg | Freq: Every day | INTRAMUSCULAR | Status: DC

## 2020-09-01 MED ORDER — FOLIC ACID 1 MG PO TABS: 1.0000 mg | ORAL_TABLET | Freq: Every day | ORAL | Status: DC

## 2020-09-01 MED ORDER — SODIUM CHLORIDE (PF) 0.9 % IJ SOLN
INTRAMUSCULAR | Status: AC
  Filled 2020-09-01: qty 50

## 2020-09-01 MED ORDER — LORAZEPAM 1 MG PO TABS: 1.0000 mg | ORAL_TABLET | ORAL | Status: DC | PRN

## 2020-09-01 MED ORDER — THIAMINE HCL 100 MG PO TABS: 100.0000 mg | ORAL_TABLET | Freq: Every day | ORAL | Status: DC

## 2020-09-01 MED ORDER — PANTOPRAZOLE SODIUM 40 MG IV SOLR
40.0000 mg | Freq: Once | INTRAVENOUS | Status: AC
  Administered 2020-09-01: 40 mg via INTRAVENOUS
  Filled 2020-09-01: qty 40

## 2020-09-01 MED ORDER — SODIUM CHLORIDE 0.9 % IV BOLUS: 1000.0000 mL | Freq: Once | INTRAVENOUS | Status: DC

## 2020-09-01 MED ORDER — GABAPENTIN 300 MG PO CAPS
300.0000 mg | ORAL_CAPSULE | Freq: Three times a day (TID) | ORAL | Status: DC
  Administered 2020-09-01: 300 mg via ORAL
  Filled 2020-09-01: qty 1

## 2020-09-01 MED ORDER — ADULT MULTIVITAMIN W/MINERALS CH: 1.0000 | ORAL_TABLET | Freq: Every day | ORAL | Status: DC

## 2020-09-01 MED ORDER — LORAZEPAM 2 MG/ML IJ SOLN
1.0000 mg | INTRAMUSCULAR | Status: DC | PRN
  Administered 2020-09-02: 2 mg via INTRAVENOUS
  Filled 2020-09-01: qty 1

## 2020-09-01 MED ORDER — ONDANSETRON HCL 4 MG/2ML IJ SOLN
4.0000 mg | Freq: Once | INTRAMUSCULAR | Status: AC
  Administered 2020-09-01: 4 mg via INTRAVENOUS
  Filled 2020-09-01: qty 2

## 2020-09-01 MED ORDER — PANTOPRAZOLE SODIUM 40 MG PO TBEC: 40.0000 mg | DELAYED_RELEASE_TABLET | Freq: Every day | ORAL | Status: DC

## 2020-09-01 MED ORDER — LORAZEPAM 1 MG PO TABS: 0.0000 mg | ORAL_TABLET | Freq: Four times a day (QID) | ORAL | Status: DC

## 2020-09-01 MED ORDER — IOHEXOL 300 MG/ML  SOLN
100.0000 mL | Freq: Once | INTRAMUSCULAR | Status: AC | PRN
  Administered 2020-09-01: 100 mL via INTRAVENOUS

## 2020-09-01 NOTE — ED Triage Notes (Signed)
Patient presents to the ER from the hotel lobby. Patient reports abdominal pain and left arm pain. Patient reports she broke her arm 7 weeks ago. Patient reports she has had 8 shots of vodka. Patient has been seen multiple times in the past for ETOH

## 2020-09-01 NOTE — ED Provider Notes (Signed)
[facility]-EMERGENCY DEPT Provider Note   CSN: [id number] Arrival date & time: 09/01/20  1801     History Chief Complaint  Patient presents with  . Abdominal Pain    [name] is a 64 y.o. female hx of alcohol dependence, pancreatitis, here presenting with abdominal pain and left arm pain and suicidal ideation.  Patient states that she is in a bad spot right now.  She has been relapsing and drinking alcohol.  She states that she has been drinking daily for the last several weeks.  She has been living in a hotel as well.  She states that she had surgery of the left arm several weeks ago and it has been hurting.  She is no longer on pain medicine anymore.  Patient also has some epigastric and lower abdominal pain as well.  Patient denies any toxic alcohol ingestion or other drug use. Finally patient states that she wants to kill herself.  She points to cut her wrists and bleed to death.  The history is provided by the patient.       Past Medical History:  Diagnosis Date  . Alcohol dependence (HCC)   . Anxiety   . Chronic pain   . Depression   . Diverticulitis   . Herniated cervical disc   . Pancreatitis   . Seizures (HCC)    alcoholic seizures    Patient Active Problem List   Diagnosis Date Noted  . Closed displaced oblique fracture of shaft of left humerus 08/01/2020  . Diverticulitis 02/29/2020  . Acute diverticulitis 02/28/2020  . GERD (gastroesophageal reflux disease) 02/28/2020  . Suicidal ideation   . Delirium tremens (HCC) 12/13/2018  . Thrombocytopenia (HCC) 12/13/2018  . Homeless 12/13/2018  . Depression 12/13/2018  . Neuropathy 12/13/2018  . Subarachnoid hemorrhage (HCC) 12/11/2018  . Seizure due to alcohol withdrawal (HCC) 12/11/2018  . Alcohol dependence with alcohol-induced mood disorder (HCC)   . 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 Major depressive disorder, recurrent severe  without psychotic features (HCC) 10/12/2018    Past Surgical History:  Procedure Laterality Date  . ABDOMINAL HYSTERECTOMY    . ANKLE SURGERY Right   . CARPAL TUNNEL RELEASE Bilateral   . CERVICAL FUSION    . CHOLECYSTECTOMY    . KNEE SURGERY       OB History   No obstetric history on file.     No family history on file.  Social History   Tobacco Use  . Smoking status: Current Every Day Smoker    Packs/day: 0.50    Years: 7.00    Pack years: 3.50    Types: Cigarettes  . Smokeless tobacco: Never Used  Vaping Use  . Vaping Use: Never used  Substance Use Topics  . Alcohol use: Yes    Comment: drinks a fifth of vodka daily  . Drug use: Not Currently    Types: Marijuana    Home Medications Prior to Admission medications   Medication Sig Start Date End Date Taking? Authorizing Provider  acetaminophen-codeine (TYLENOL #3) 300-30 MG tablet 1 tab every 6-8 hours prn pain 08/16/20   Cristie Hem, PA-C  diclofenac Sodium (VOLTAREN) 1 % GEL Apply 2 g topically 4 (four) times daily. 07/27/20   Khatri, Hina, PA-C  DULoxetine (CYMBALTA) 30 MG capsule Take 1 capsule (30 mg total) by mouth daily. 06/11/20   Money, [name], FNP  gabapentin (NEURONTIN) 100 MG capsule Take 1 capsule (100 mg total)  by mouth 3 (three) times daily. 08/01/20   Tarry Kos, MD  gabapentin (NEURONTIN) 300 MG capsule Take 1 capsule (300 mg total) by mouth 3 (three) times daily. 06/06/20   Raeford Razor, MD  gabapentin (NEURONTIN) 800 MG tablet Take 800 mg by mouth 3 (three) times daily. 07/12/20   [provider]  HYDROcodone-acetaminophen (NORCO) 5-325 MG tablet Take 1-2 tablets by mouth daily as needed. 08/12/20   Cristie Hem, PA-C  oxyCODONE-acetaminophen (PERCOCET) 5-325 MG tablet Take 1 tablet by mouth every 8 (eight) hours as needed for severe pain. 08/01/20   Tarry Kos, MD  pantoprazole (PROTONIX) 40 MG tablet Take 1 tablet (40 mg total) by mouth daily. 08/04/19   Lorelee New,  PA-C  metoCLOPramide (REGLAN) 10 MG tablet Take 1 tablet (10 mg total) by mouth every 6 (six) hours as needed for nausea (nausea/headache). 04/06/19 05/24/19  Mesner, Barbara Cower, MD    Allergies    Cephalexin, Ketorolac, Prednisone, and Tramadol  Review of Systems   Review of Systems  Gastrointestinal: Positive for abdominal pain.  Musculoskeletal:       L elbow pain   All other systems reviewed and are negative.   Physical Exam Updated Vital Signs BP (!) 149/92   Pulse 99   Temp 98 F (36.7 C) (Oral)   Resp 18   SpO2 99%   Physical Exam Vitals and nursing note reviewed.  Constitutional:      Comments: Intoxicated, dehydrated   HENT:     Head: Normocephalic.  Eyes:     Extraocular Movements: Extraocular movements intact.     Pupils: Pupils are equal, round, and reactive to light.  Cardiovascular:     Rate and Rhythm: Normal rate and regular rhythm.     Heart sounds: Normal heart sounds.  Pulmonary:     Effort: Pulmonary effort is normal.     Breath sounds: Normal breath sounds.  Abdominal:     Comments: + epigastric tenderness, mild lower abdominal tenderness   Skin:    Capillary Refill: Capillary refill takes less than 2 seconds.     Comments: L elbow previous surgical scar healing well, no obvious cellulitis, nl ROM L elbow   Neurological:     General: No focal deficit present.     Mental Status: She is oriented to person, place, and time.  Psychiatric:        Mood and Affect: Mood normal.        Behavior: Behavior normal.     ED Results / Procedures / Treatments   Labs (all labs ordered are listed, but only abnormal results are displayed) Labs Reviewed  COMPREHENSIVE METABOLIC PANEL - Abnormal; Notable for the following components:      Result Value   Potassium 3.1 (*)    CO2 16 (*)    Glucose, Bld 175 (*)    AST 54 (*)    Anion gap 20 (*)    All other components within normal limits  CBC - Abnormal; Notable for the following components:   WBC 12.3 (*)     All other components within normal limits  URINALYSIS, ROUTINE W REFLEX MICROSCOPIC - Abnormal; Notable for the following components:   APPearance HAZY (*)    Ketones, ur 5 (*)    All other components within normal limits  ETHANOL - Abnormal; Notable for the following components:   Alcohol, Ethyl (B) 253 (*)    All other components within normal limits  LIPASE, BLOOD  EKG None  Radiology No results found.  Procedures Procedures (including critical care time)  Medications Ordered in ED Medications  ondansetron (ZOFRAN) injection 4 mg (has no administration in time range)  fentaNYL (SUBLIMAZE) injection 50 mcg (has no administration in time range)  pantoprazole (PROTONIX) injection 40 mg (has no administration in time range)  potassium chloride SA (KLOR-CON) CR tablet 40 mEq (40 mEq Oral Given 09/01/20 2057)  sodium chloride 0.9 % bolus 2,000 mL (2,000 mLs Intravenous New Bag/Given 09/01/20 2056)    ED Course  I have reviewed the triage vital signs and the nursing notes.  Pertinent labs & imaging results that were available during my care of the patient were reviewed by me and considered in my medical decision making (see chart for details).    MDM Rules/Calculators/A&P                         Rochell Puett is a 64 y.o. female here presenting with abdominal pain and alcohol ingestion and suicidal ideation.  Patient has recently relapsed on alcohol.  Patient has been vomiting and having diarrhea as well.  Abdominal pain likely from alcohol gastritis or diverticulitis.  Will get CBC and CMP and alcohol level and CT abdomen pelvis .  Patient also has suicidal ideation which can be from alcohol or primary psych issue.  If her CT is unremarkable, plan to repeat chemistry and make sure her anion gap closes and then she will be medically cleared for psych eval   11:16 PM CT unremarkable. Tachycardia resolved. Repeat BMP pending. Signed out to Dr. Read Drivers in the ED. If repeat BMP  showed that AG is closing, she is medically cleared for psych eval.   Final Clinical Impression(s) / ED Diagnoses Final diagnoses:  None    Rx / DC Orders ED Discharge Orders    None       Charlynne Pander, MD 09/01/20 2317

## 2020-09-02 ENCOUNTER — Ambulatory Visit (HOSPITAL_COMMUNITY)
Admission: EM | Admit: 2020-09-02 | Discharge: 2020-09-03 | Payer: Medicaid Other | Attending: Behavioral Health | Admitting: Behavioral Health

## 2020-09-02 ENCOUNTER — Encounter (HOSPITAL_COMMUNITY): Payer: Self-pay

## 2020-09-02 ENCOUNTER — Other Ambulatory Visit: Payer: Self-pay

## 2020-09-02 DIAGNOSIS — K859 Acute pancreatitis without necrosis or infection, unspecified: Secondary | ICD-10-CM | POA: Insufficient documentation

## 2020-09-02 DIAGNOSIS — F17200 Nicotine dependence, unspecified, uncomplicated: Secondary | ICD-10-CM | POA: Insufficient documentation

## 2020-09-02 DIAGNOSIS — F332 Major depressive disorder, 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 psychotic features: Secondary | ICD-10-CM | POA: Insufficient documentation

## 2020-09-02 DIAGNOSIS — F1010 Alcohol abuse, uncomplicated: Secondary | ICD-10-CM

## 2020-09-02 DIAGNOSIS — F1014 Alcohol abuse with alcohol-induced mood disorder: Secondary | ICD-10-CM | POA: Insufficient documentation

## 2020-09-02 DIAGNOSIS — Z20822 Contact with and (suspected) exposure to covid-19: Secondary | ICD-10-CM | POA: Insufficient documentation

## 2020-09-02 DIAGNOSIS — Z5901 Sheltered homelessness: Secondary | ICD-10-CM | POA: Insufficient documentation

## 2020-09-02 DIAGNOSIS — F331 Major depressive disorder, recurrent, moderate: Secondary | ICD-10-CM

## 2020-09-02 LAB — BASIC METABOLIC PANEL
Anion gap: 14 (ref 5–15)
BUN: 15 mg/dL (ref 8–23)
CO2: 19 mmol/L — ABNORMAL LOW (ref 22–32)
Calcium: 8.3 mg/dL — ABNORMAL LOW (ref 8.9–10.3)
Chloride: 105 mmol/L (ref 98–111)
Creatinine, Ser: 0.7 mg/dL (ref 0.44–1.00)
GFR, Estimated: >60 mL/min (ref >60)
Glucose, Bld: 63 mg/dL — ABNORMAL LOW (ref 70–99)
Potassium: 3.7 mmol/L (ref 3.5–5.1)
Sodium: 138 mmol/L (ref 135–145)

## 2020-09-02 LAB — RAPID URINE DRUG SCREEN, HOSP PERFORMED
Amphetamines: NOT DETECTED
Barbiturates: NOT DETECTED
Benzodiazepines: NOT DETECTED
Cocaine: NOT DETECTED
Opiates: NOT DETECTED
Tetrahydrocannabinol: POSITIVE — AB

## 2020-09-02 LAB — RESPIRATORY PANEL BY RT PCR (FLU A&B, COVID)
Influenza A by PCR: NEGATIVE
Influenza A by PCR: NEGATIVE
Influenza B by PCR: NEGATIVE
Influenza B by PCR: NEGATIVE
SARS Coronavirus 2 by RT PCR: NEGATIVE
SARS Coronavirus 2 by RT PCR: NEGATIVE

## 2020-09-02 LAB — MAGNESIUM: Magnesium: 1.8 mg/dL (ref 1.7–2.4)

## 2020-09-02 LAB — PHOSPHORUS: Phosphorus: 3.1 mg/dL (ref 2.5–4.6)

## 2020-09-02 LAB — POC SARS CORONAVIRUS 2 AG -  ED: SARS Coronavirus 2 Ag: NEGATIVE

## 2020-09-02 MED ORDER — PANTOPRAZOLE SODIUM 40 MG PO TBEC
40.0000 mg | DELAYED_RELEASE_TABLET | Freq: Every day | ORAL | Status: DC
  Administered 2020-09-02 – 2020-09-03 (×2): 40 mg via ORAL
  Filled 2020-09-02: qty 1
  Filled 2020-09-02: qty 14
  Filled 2020-09-02: qty 1

## 2020-09-02 MED ORDER — LORAZEPAM 1 MG PO TABS: 1.0000 mg | ORAL_TABLET | Freq: Two times a day (BID) | ORAL | Status: DC

## 2020-09-02 MED ORDER — THIAMINE HCL 100 MG PO TABS
100.0000 mg | ORAL_TABLET | Freq: Every day | ORAL | Status: DC
  Administered 2020-09-03: 100 mg via ORAL
  Filled 2020-09-02: qty 1

## 2020-09-02 MED ORDER — LORAZEPAM 1 MG PO TABS: 1.0000 mg | ORAL_TABLET | Freq: Every day | ORAL | Status: DC

## 2020-09-02 MED ORDER — MAGNESIUM HYDROXIDE 400 MG/5ML PO SUSP: 30.0000 mL | Freq: Every day | ORAL | Status: DC | PRN

## 2020-09-02 MED ORDER — ONDANSETRON 4 MG PO TBDP
4.0000 mg | ORAL_TABLET | Freq: Four times a day (QID) | ORAL | Status: DC | PRN
  Administered 2020-09-02: 4 mg via ORAL
  Filled 2020-09-02: qty 1

## 2020-09-02 MED ORDER — HYDROXYZINE HCL 25 MG PO TABS
25.0000 mg | ORAL_TABLET | Freq: Four times a day (QID) | ORAL | Status: DC | PRN
  Administered 2020-09-03: 25 mg via ORAL
  Filled 2020-09-02 (×3): qty 1

## 2020-09-02 MED ORDER — ACETAMINOPHEN 325 MG PO TABS
650.0000 mg | ORAL_TABLET | Freq: Four times a day (QID) | ORAL | Status: DC | PRN
  Administered 2020-09-02 (×3): 650 mg via ORAL
  Filled 2020-09-02 (×4): qty 2

## 2020-09-02 MED ORDER — GABAPENTIN 300 MG PO CAPS
300.0000 mg | ORAL_CAPSULE | Freq: Three times a day (TID) | ORAL | Status: DC
  Administered 2020-09-02 – 2020-09-03 (×4): 300 mg via ORAL
  Filled 2020-09-02: qty 42
  Filled 2020-09-02 (×4): qty 1

## 2020-09-02 MED ORDER — LOPERAMIDE HCL 2 MG PO CAPS: 2.0000 mg | ORAL_CAPSULE | ORAL | Status: DC | PRN

## 2020-09-02 MED ORDER — ALUM & MAG HYDROXIDE-SIMETH 200-200-20 MG/5ML PO SUSP: 30.0000 mL | ORAL | Status: DC | PRN

## 2020-09-02 MED ORDER — LORAZEPAM 1 MG PO TABS: 1.0000 mg | ORAL_TABLET | Freq: Four times a day (QID) | ORAL | Status: DC | PRN

## 2020-09-02 MED ORDER — PANTOPRAZOLE SODIUM 40 MG PO TBEC: 40.0000 mg | DELAYED_RELEASE_TABLET | Freq: Every day | ORAL | 0 refills | Status: DC

## 2020-09-02 MED ORDER — THIAMINE HCL 100 MG/ML IJ SOLN
100.0000 mg | Freq: Once | INTRAMUSCULAR | Status: AC
  Administered 2020-09-02: 100 mg via INTRAMUSCULAR
  Filled 2020-09-02: qty 2

## 2020-09-02 MED ORDER — FLUOXETINE HCL 20 MG PO CAPS: 20.0000 mg | ORAL_CAPSULE | Freq: Every day | ORAL | 0 refills | Status: DC

## 2020-09-02 MED ORDER — LORAZEPAM 1 MG PO TABS: 1.0000 mg | ORAL_TABLET | Freq: Three times a day (TID) | ORAL | Status: DC

## 2020-09-02 MED ORDER — LORAZEPAM 1 MG PO TABS
1.0000 mg | ORAL_TABLET | Freq: Four times a day (QID) | ORAL | Status: DC
  Administered 2020-09-02 – 2020-09-03 (×5): 1 mg via ORAL
  Filled 2020-09-02 (×5): qty 1

## 2020-09-02 MED ORDER — HYDROXYZINE HCL 25 MG PO TABS: 25.0000 mg | ORAL_TABLET | Freq: Four times a day (QID) | ORAL | 0 refills | Status: DC | PRN

## 2020-09-02 MED ORDER — ADULT MULTIVITAMIN W/MINERALS CH
1.0000 | ORAL_TABLET | Freq: Every day | ORAL | Status: DC
  Administered 2020-09-02 – 2020-09-03 (×2): 1 via ORAL
  Filled 2020-09-02 (×2): qty 1

## 2020-09-02 MED ORDER — GABAPENTIN 300 MG PO CAPS: 300.0000 mg | ORAL_CAPSULE | Freq: Three times a day (TID) | ORAL | 0 refills | Status: DC

## 2020-09-02 NOTE — ED Provider Notes (Signed)
Nursing notes and vitals signs, including pulse oximetry, reviewed.  Summary of this visit's results, reviewed by myself:  EKG:  EKG Interpretation  Date/Time:    Ventricular Rate:    PR Interval:    QRS Duration:   QT Interval:    QTC Calculation:   R Axis:     Text Interpretation:         Labs:  Results for orders placed or performed during the hospital encounter of 09/01/20 (from the past 24 hour(s))  Lipase, blood     Status: None   Collection Time: 09/01/20  6:21 PM  Result Value Ref Range   Lipase 25 11 - 51 U/L  Comprehensive metabolic panel     Status: Abnormal   Collection Time: 09/01/20  6:21 PM  Result Value Ref Range   Sodium 136 135 - 145 mmol/L   Potassium 3.1 (L) 3.5 - 5.1 mmol/L   Chloride 100 98 - 111 mmol/L   CO2 16 (L) 22 - 32 mmol/L   Glucose, Bld 175 (H) 70 - 99 mg/dL   BUN 21 8 - 23 mg/dL   Creatinine, Ser 9.51 0.44 - 1.00 mg/dL   Calcium 8.9 8.9 - 88.4 mg/dL   Total Protein 7.3 6.5 - 8.1 g/dL   Albumin 4.4 3.5 - 5.0 g/dL   AST 54 (H) 15 - 41 U/L   ALT 36 0 - 44 U/L   Alkaline Phosphatase 93 38 - 126 U/L   Total Bilirubin 0.5 0.3 - 1.2 mg/dL   GFR, Estimated >16 >60 mL/min   Anion gap 20 (H) 5 - 15  CBC     Status: Abnormal   Collection Time: 09/01/20  6:21 PM  Result Value Ref Range   WBC 12.3 (H) 4.0 - 10.5 K/uL   RBC 4.43 3.87 - 5.11 MIL/uL   Hemoglobin 14.1 12.0 - 15.0 g/dL   HCT 63.0 36 - 46 %   MCV 95.0 80.0 - 100.0 fL   MCH 31.8 26.0 - 34.0 pg   MCHC 33.5 30.0 - 36.0 g/dL   RDW 16.0 10.9 - 32.3 %   Platelets 318 150 - 400 K/uL   nRBC 0.0 0.0 - 0.2 %  Urinalysis, Routine w reflex microscopic     Status: Abnormal   Collection Time: 09/01/20  6:21 PM  Result Value Ref Range   Color, Urine YELLOW YELLOW   APPearance HAZY (A) CLEAR   Specific Gravity, Urine 1.026 1.005 - 1.030   pH 5.0 5.0 - 8.0   Glucose, UA NEGATIVE NEGATIVE mg/dL   Hgb urine dipstick NEGATIVE NEGATIVE   Bilirubin Urine NEGATIVE NEGATIVE   Ketones, ur 5 (A)  NEGATIVE mg/dL   Protein, ur NEGATIVE NEGATIVE mg/dL   Nitrite NEGATIVE NEGATIVE   Leukocytes,Ua NEGATIVE NEGATIVE  Ethanol     Status: Abnormal   Collection Time: 09/01/20  6:21 PM  Result Value Ref Range   Alcohol, Ethyl (B) 253 (H) <10 mg/dL  Basic metabolic panel     Status: Abnormal   Collection Time: 09/02/20 12:01 AM  Result Value Ref Range   Sodium 138 135 - 145 mmol/L   Potassium 3.7 3.5 - 5.1 mmol/L   Chloride 105 98 - 111 mmol/L   CO2 19 (L) 22 - 32 mmol/L   Glucose, Bld 63 (L) 70 - 99 mg/dL   BUN 15 8 - 23 mg/dL   Creatinine, Ser 5.57 0.44 - 1.00 mg/dL   Calcium 8.3 (L) 8.9 - 10.3 mg/dL  GFR, Estimated >60 >60 mL/min   Anion gap 14 5 - 15  Magnesium     Status: None   Collection Time: 09/02/20 12:01 AM  Result Value Ref Range   Magnesium 1.8 1.7 - 2.4 mg/dL  Phosphorus     Status: None   Collection Time: 09/02/20 12:01 AM  Result Value Ref Range   Phosphorus 3.1 2.5 - 4.6 mg/dL  Rapid urine drug screen (hospital performed)     Status: Abnormal   Collection Time: 09/02/20 12:05 AM  Result Value Ref Range   Opiates NONE DETECTED NONE DETECTED   Cocaine NONE DETECTED NONE DETECTED   Benzodiazepines NONE DETECTED NONE DETECTED   Amphetamines NONE DETECTED NONE DETECTED   Tetrahydrocannabinol POSITIVE (A) NONE DETECTED   Barbiturates NONE DETECTED NONE DETECTED    Imaging Studies: CT ABDOMEN PELVIS W CONTRAST  Result Date: 09/01/2020 CLINICAL DATA:  Abdominal pain EXAM: CT ABDOMEN AND PELVIS WITH CONTRAST TECHNIQUE: Multidetector CT imaging of the abdomen and pelvis was performed using the standard protocol following bolus administration of intravenous contrast. CONTRAST:  OMNIPAQUE IOHEXOL 300 MG/ML  SOLN COMPARISON:  02/28/2020 FINDINGS: Lower chest: No acute pleural or parenchymal lung disease. Hepatobiliary: Stable hepatic steatosis. No focal liver abnormality. The gallbladder is surgically absent. Pancreas: Unremarkable. No pancreatic ductal dilatation  or surrounding inflammatory changes. Spleen: Normal in size without focal abnormality. Adrenals/Urinary Tract: Adrenal glands are unremarkable. Kidneys are normal, without renal calculi, focal lesion, or hydronephrosis. Bladder is unremarkable. Stomach/Bowel: No bowel obstruction or ileus. Diverticulosis of the distal colon without diverticulitis. Normal appendix right lower quadrant. Vascular/Lymphatic: No significant vascular findings are present. No enlarged abdominal or pelvic lymph nodes. Reproductive: Status post hysterectomy. No adnexal masses. Other: No free fluid or free gas.  No abdominal wall hernia. Musculoskeletal: No acute or destructive bony lesions. Reconstructed images demonstrate no additional findings. IMPRESSION: 1. No acute intra-abdominal or intrapelvic process. 2. Diverticulosis with no evidence of diverticulitis. Electronically Signed   By: Sharlet Salina M.D.   On: 09/01/2020 23:12   2:11 AM Anion gap closing.    Darion Juhasz, Jonny Ruiz, MD 09/02/20 [phone]

## 2020-09-02 NOTE — ED Notes (Signed)
Pt changed into hospital provided scrubs and belongings gathered. Pt verifies all belongings are gathered and secured.

## 2020-09-02 NOTE — Discharge Instructions (Signed)
For your shelter needs, contact the following service providers:       [facility] (operated by [facility])      [address] [place], [place] [address]      [phone]       [facility]      [address]      [place], [place] [address]      [phone]  For day shelter and other supportive services for the homeless, contact the [facility]):       [facility]      [address]      [place], [place] [address]      [phone]  For transitional housing, contact one of the following agencies.  They provide longer term housing than a shelter, but there is an application process:       [facility]      [address]      [place], [place] [address]      [phone]       [facility]      [address], [place] [address]      [phone]

## 2020-09-02 NOTE — ED Notes (Signed)
Patient sitting on bed with eyes opened. No distress noted.

## 2020-09-02 NOTE — ED Notes (Signed)
Patient sleeping heavily, had to be touched on shoulder while loudly saying name to arouse. Patient reports pain in arm - chronic - and meds given. Patient denies SI/HI/AVH at this time. In no distress.

## 2020-09-02 NOTE — ED Notes (Signed)
Patient is resting in bed with eyes closed. Respirations even and non labored no distress noted.

## 2020-09-02 NOTE — ED Notes (Signed)
Pt resting on pull out with eyes closed regular respirations in no acute distress. Will further assess with med administration.  
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
## 2020-09-02 NOTE — ED Provider Notes (Signed)
Behavioral Health Admission H&P [facility](FBC & OBS)  Date: 09/02/20 Patient Name: [name] MRN: [id number] Chief Complaint:  Chief Complaint  Patient presents with  . Suicidal  . Alcohol Problem      Diagnoses: Alcohol abuse Major depression disorder recurrent, severe  HPI: [name] is a 64 y.o. female hx of alcohol dependence, pancreatitis, [facility] due to her presenting here with suicidal ideation because she relapsed from being sober for two weeks. The patient voiced three weeks she fell and broke her left elbow, and her pain is constant. Her doctor took her off her pain medications. The patient expressed it was not helping. The patient was initially seen in the ER for abdominal pain, left arm pain, and suicidal ideation.  The patient states that she is in a "bad" spot right now.  She has been relapsing and drinking alcohol.  She says that she has been drinking daily for the last several weeks.  She has been living in a hotel as well.  She states that she had surgery on the left arm several weeks ago after hurting it. She is no longer on pain medicine anymore.  The patient also has some epigastric and lower abdominal pain as well.  Patient denies any toxic alcohol ingestion or other drug use. Finally patient states that she wants to kill herself.  She points to cut her wrists and bleed to death. She was started on the Ativan Detox protocol.  PHQ 2-9:    [facility]  Thoughts that you would be better off dead, or of hurting yourself in some way Nearly every day  PHQ-9 Total Score 27        ED from 09/01/2020 in [facility] (Dis[facility]) [facility] 12[facility]16/2018 [facility]  C-SSRS RISK CATEGORY High Risk High Risk High Risk       Total Time spent with patient: 20  minutes  Musculoskeletal  Strength & Muscle Tone: within normal limits Gait & Station: normal Patient leans: N/A  Psychiatric Specialty Exam  Presentation General Appearance: Appropriate for Environment;Casual  Eye Contact:Good  Speech:Clear and Coherent;Normal Rate  Speech Volume:Normal  Handedness:Right   Mood and Affect  Mood:Anxious;Depressed  Affect:Congruent;Appropriate   Thought Process  Thought Processes:Coherent  Descriptions of Associations:Intact  Orientation:Full (Time, Place and Person)  Thought Content:Illogical;WDL  Hallucinations:Hallucinations: None  Ideas of Reference:None  Suicidal Thoughts:Suicidal Thoughts: Yes, Active SI Active Intent and/or Plan: With Intent;With Plan;With Means to Carry Out;With Access to Means  Homicidal Thoughts:Homicidal Thoughts: No   Sensorium  Memory:Immediate Good;Recent Good;Remote Good  Judgment:Good  Insight:Good   Executive Functions  Concentration:Good  Attention Span:Good  Recall:Good  Fund of Knowledge:Good  Language:Good   Psychomotor Activity  Psychomotor Activity:Psychomotor Activity: Normal   Assets  Assets:Communication Skills;Desire for Improvement;Financial Resources/Insurance;Transportation   Sleep  Sleep:Sleep: Good   Physical Exam Vitals and nursing note reviewed.  Constitutional:      Appearance: Normal appearance. She is normal weight.  HENT:     Right Ear: Tympanic membrane normal.     Left Ear: Tympanic membrane normal.     Nose: Nose normal.     Mouth/Throat:     Mouth: Mucous membranes are moist.  Cardiovascular:     Rate and Rhythm: Tachycardia present.  Pulmonary:     Effort: Pulmonary effort is normal.  Musculoskeletal:  General: Normal range of motion.     Cervical back: Normal range of motion and neck supple.  Skin:    General: Skin is warm and dry.  Neurological:     General: No focal deficit present.     Mental Status: She is alert and  oriented to person, place, and time. Mental status is at baseline.  Psychiatric:        Attention and Perception: Attention and perception normal.        Mood and Affect: Mood is anxious. Affect is blunt.        Speech: Speech normal.        Behavior: Behavior is uncooperative.        Thought Content: Thought content normal.        Cognition and Memory: Cognition and memory normal.    Review of Systems  Psychiatric/Behavioral: Positive for depression, substance abuse and suicidal ideas. The patient is nervous/anxious.   All other systems reviewed and are negative.   Blood pressure (!) 148/88, pulse (!) 101, temperature 98.7 F (37.1 C), temperature source Oral, resp. rate 17, SpO2 100 %. There is no height or weight on file to calculate BMI.  Past Psychiatric History:    Is the patient at risk to self? Yes  Has the patient been a risk to self in the past 6 months? Yes .    Has the patient been a risk to self within the distant past? Yes   Is the patient a risk to others? No   Has the patient been a risk to others in the past 6 months? No   Has the patient been a risk to others within the distant past? No   Past Medical History:  Past Medical History:  Diagnosis Date  . Alcohol dependence (HCC)   . Anxiety   . Chronic pain   . Depression   . Diverticulitis   . Herniated cervical disc   . Pancreatitis   . Seizures (HCC)    alcoholic seizures    Past Surgical History:  Procedure Laterality Date  . ABDOMINAL HYSTERECTOMY    . ANKLE SURGERY Right   . CARPAL TUNNEL RELEASE Bilateral   . CERVICAL FUSION    . CHOLECYSTECTOMY    . KNEE SURGERY      Family History: History reviewed. No pertinent family history.  Social History:  Social History   Socioeconomic History  . Marital status: Single    Spouse name: Not on file  . Number of children: Not on file  . Years of education: Not on file  . Highest education level: Not on file  Occupational History  . Not on file   Tobacco Use  . Smoking status: Current Every Day Smoker    Packs/day: 0.50    Years: 7.00    Pack years: 3.50    Types: Cigarettes  . Smokeless tobacco: Never Used  Vaping Use  . Vaping Use: Never used  Substance and Sexual Activity  . Alcohol use: Yes    Comment: drinks a fifth of vodka daily  . Drug use: Not Currently    Types: Marijuana  . Sexual activity: Not Currently  Other Topics Concern  . Not on file  Social History Narrative   Pt lives in a [employer]; lives with boyfriend.   Social Determinants of Health   Financial Resource Strain:   . Difficulty of Paying Living Expenses: Not on file  Food Insecurity:   . Worried About [employer] of  Food in the Last Year: Not on file  . Ran Out of Food in the Last Year: Not on file  Transportation Needs:   . Lack of Transportation (Medical): Not on file  . Lack of Transportation (Non-Medical): Not on file  Physical Activity:   . Days of Exercise per Week: Not on file  . Minutes of Exercise per Session: Not on file  Stress:   . Feeling of Stress : Not on file  Social Connections:   . Frequency of Communication with Friends and Family: Not on file  . Frequency of Social Gatherings with Friends and Family: Not on file  . Attends Religious Services: Not on file  . Active Member of Clubs or Organizations: Not on file  . Attends Banker Meetings: Not on file  . Marital Status: Not on file  Intimate Partner Violence:   . Fear of Current or Ex-Partner: Not on file  . Emotionally Abused: Not on file  . Physically Abused: Not on file  . Sexually Abused: Not on file    SDOH:  SDOH Screenings   Alcohol Screen:   . Last Alcohol Screening Score (AUDIT): Not on file  Depression (PHQ2-9): Medium Risk  . PHQ-2 Score: 27  Financial Resource Strain:   . Difficulty of Paying Living Expenses: Not on file  Food Insecurity:   . Worried About Programme researcher, broadcasting/film/video in the Last Year: Not on file  . Ran Out of Food in the Last  Year: Not on file  Housing:   . Last Housing Risk Score: Not on file  Physical Activity:   . Days of Exercise per Week: Not on file  . Minutes of Exercise per Session: Not on file  Social Connections:   . Frequency of Communication with Friends and Family: Not on file  . Frequency of Social Gatherings with Friends and Family: Not on file  . Attends Religious Services: Not on file  . Active Member of Clubs or Organizations: Not on file  . Attends Banker Meetings: Not on file  . Marital Status: Not on file  Stress:   . Feeling of Stress : Not on file  Tobacco Use: High Risk  . Smoking Tobacco Use: Current Every Day Smoker  . Smokeless Tobacco Use: Never Used  Transportation Needs:   . Freight forwarder (Medical): Not on file  . Lack of Transportation (Non-Medical): Not on file    Last Labs:  Admission on 09/02/2020  Component Date Value Ref Range Status  . SARS Coronavirus 2 Ag 09/02/2020 Negative  Negative Preliminary  Admission on 09/01/2020, Discharged on 09/02/2020  Component Date Value Ref Range Status  . Lipase 09/01/2020 25  11 - 51 U/L Final   Performed at [facility], [address] [place], [place] [address]  . Sodium 09/01/2020 136  135 - 145 mmol/L Final  . Potassium 09/01/2020 3.1* 3.5 - 5.1 mmol/L Final  . Chloride 09/01/2020 100  98 - 111 mmol/L Final  . CO2 09/01/2020 16* 22 - 32 mmol/L Final  . Glucose, Bld 09/01/2020 175* 70 - 99 mg/dL Final   Glucose reference range applies only to samples taken after fasting for at least 8 hours.  . BUN 09/01/2020 21  8 - 23 mg/dL Final  . Creatinine, Ser 09/01/2020 0.78  0.44 - 1.00 mg/dL Final  . Calcium 60/45/4098 8.9  8.9 - 10.3 mg/dL Final  . Total Protein 09/01/2020 7.3  6.5 - 8.1 g/dL Final  .  Albumin 09/01/2020 4.4  3.5 - 5.0 g/dL Final  . AST 75/07/2584 54* 15 - 41 U/L Final  . ALT 09/01/2020 36  0 - 44 U/L Final  . Alkaline Phosphatase 09/01/2020 93  38 - 126 U/L Final  .  Total Bilirubin 09/01/2020 0.5  0.3 - 1.2 mg/dL Final  . GFR, Estimated 09/01/2020 >60  >60 mL/min Final   Comment: (NOTE) Calculated using the CKD-EPI Creatinine Equation (2021)   . Anion gap 09/01/2020 20* 5 - 15 Final   Performed at [facility], [address], [place], [place] [address]  . WBC 09/01/2020 12.3* 4.0 - 10.5 K/uL Final  . RBC 09/01/2020 4.43  3.87 - 5.11 MIL/uL Final  . Hemoglobin 09/01/2020 14.1  12.0 - 15.0 g/dL Final  . HCT 42/35/3614 42.1  36 - 46 % Final  . MCV 09/01/2020 95.0  80.0 - 100.0 fL Final  . MCH 09/01/2020 31.8  26.0 - 34.0 pg Final  . MCHC 09/01/2020 33.5  30.0 - 36.0 g/dL Final  . RDW 43/15/4008 13.3  11.5 - 15.5 % Final  . Platelets 09/01/2020 318  150 - 400 K/uL Final  . nRBC 09/01/2020 0.0  0.0 - 0.2 % Final   Performed at [facility], [address] [place], [place] [address]  . Color, Urine 09/01/2020 YELLOW  YELLOW Final  . APPearance 09/01/2020 HAZY* CLEAR Final  . Specific Gravity, Urine 09/01/2020 1.026  1.005 - 1.030 Final  . pH 09/01/2020 5.0  5.0 - 8.0 Final  . Glucose, UA 09/01/2020 NEGATIVE  NEGATIVE mg/dL Final  . Hgb urine dipstick 09/01/2020 NEGATIVE  NEGATIVE Final  . Bilirubin Urine 09/01/2020 NEGATIVE  NEGATIVE Final  . Ketones, ur 09/01/2020 5* NEGATIVE mg/dL Final  . Protein, ur 50/93/2671 NEGATIVE  NEGATIVE mg/dL Final  . Nitrite 24/58/0998 NEGATIVE  NEGATIVE Final  . Glori Luis 09/01/2020 NEGATIVE  NEGATIVE Final   Performed at [facility], [address], [place], [place] [address]  . Alcohol, Ethyl (B) 09/01/2020 253* <10 mg/dL Final   Comment: (NOTE) Lowest detectable limit for serum alcohol is 10 mg/dL.  For medical purposes only. Performed at [facility], [address] [place], [place] [address]   . Opiates 09/02/2020 NONE DETECTED  NONE DETECTED Final  . Cocaine 09/02/2020 NONE DETECTED  NONE DETECTED Final  . Benzodiazepines  09/02/2020 NONE DETECTED  NONE DETECTED Final  . Amphetamines 09/02/2020 NONE DETECTED  NONE DETECTED Final  . Tetrahydrocannabinol 09/02/2020 POSITIVE* NONE DETECTED Final  . Barbiturates 09/02/2020 NONE DETECTED  NONE DETECTED Final   Comment: (NOTE) DRUG SCREEN FOR MEDICAL PURPOSES ONLY.  IF CONFIRMATION IS NEEDED FOR ANY PURPOSE, NOTIFY LAB WITHIN 5 DAYS.  LOWEST DETECTABLE LIMITS FOR URINE DRUG SCREEN Drug Class                     Cutoff (ng/mL) Amphetamine and metabolites    1000 Barbiturate and metabolites    200 Benzodiazepine                 200 Tricyclics and metabolites     300 Opiates and metabolites        300 Cocaine and metabolites        300 THC                            50 Performed at [facility], [address]  [place], [place] [address]   . Sodium 09/02/2020 138  135 - 145 mmol/L Final  . Potassium 09/02/2020 3.7  3.5 - 5.1 mmol/L Final  . Chloride 09/02/2020 105  98 - 111 mmol/L Final  . CO2 09/02/2020 19* 22 - 32 mmol/L Final  . Glucose, Bld 09/02/2020 63* 70 - 99 mg/dL Final   Glucose reference range applies only to samples taken after fasting for at least 8 hours.  . BUN 09/02/2020 15  8 - 23 mg/dL Final  . Creatinine, Ser 09/02/2020 0.70  0.44 - 1.00 mg/dL Final  . Calcium 91/47/8295 8.3* 8.9 - 10.3 mg/dL Final  . GFR, Estimated 09/02/2020 >60  >60 mL/min Final   Comment: (NOTE) Calculated using the CKD-EPI Creatinine Equation (2021)   . Anion gap 09/02/2020 14  5 - 15 Final   Performed at [facility], [address] [place], [place] [address]  . Magnesium 09/02/2020 1.8  1.7 - 2.4 mg/dL Final   Performed at [facility], [address], [place], [place] [address]  . Phosphorus 09/02/2020 3.1  2.5 - 4.6 mg/dL Final   Performed at [facility], [address] [place], [place] [address]  . SARS Coronavirus 2 by RT PCR 09/02/2020 NEGATIVE  NEGATIVE Final   Comment:  (NOTE) SARS-CoV-2 target nucleic acids are NOT DETECTED.  The SARS-CoV-2 RNA is generally detectable in upper respiratoy specimens during the acute phase of infection. The lowest concentration of SARS-CoV-2 viral copies this assay can detect is 131 copies/mL. A negative result does not preclude SARS-Cov-2 infection and should not be used as the sole basis for treatment or other patient management decisions. A negative result may occur with  improper specimen collection/handling, submission of specimen other than nasopharyngeal swab, presence of viral mutation(s) within the areas targeted by this assay, and inadequate number of viral copies (<131 copies/mL). A negative result must be combined with clinical observations, patient history, and epidemiological information. The expected result is Negative.  Fact Sheet for Patients:  https://www.moore.com/  Fact Sheet for Healthcare Providers:  https://www.young.biz/  This test is no                          t yet approved or cleared by the [place] FDA and  has been authorized for detection and/or diagnosis of SARS-CoV-2 by FDA under an Emergency Use Authorization (EUA). This EUA will remain  in effect (meaning this test can be used) for the duration of the COVID-19 declaration under Section 564(b)(1) of the Act, 21 U.S.C. section 360bbb-3(b)(1), unless the authorization is terminated or revoked sooner.    . Influenza A by PCR 09/02/2020 NEGATIVE  NEGATIVE Final  . Influenza B by PCR 09/02/2020 NEGATIVE  NEGATIVE Final   Comment: (NOTE) The Xpert Xpress SARS-CoV-2/FLU/RSV assay is intended as an aid in  the diagnosis of influenza from Nasopharyngeal swab specimens and  should not be used as a sole basis for treatment. Nasal washings and  aspirates are unacceptable for Xpert Xpress SARS-CoV-2/FLU/RSV  testing.  Fact Sheet for Patients: https://www.moore.com/  Fact  Sheet for Healthcare Providers: https://www.young.biz/  This test is not yet approved or cleared by the [place] FDA and  has been authorized for detection and/or diagnosis of SARS-CoV-2 by  FDA under an Emergency Use Authorization (EUA). This EUA will remain  in effect (meaning this test can be used) for the duration of the  Covid-19 declaration under Section 564(b)(1)  of the Act, 21  U.S.C. section 360bbb-3(b)(1), unless the authorization is  terminated or revoked. Performed at [facility], [address], [place], [place] [address]   Admission on 06/09/2020, Discharged on 06/09/2020  Component Date Value Ref Range Status  . Sodium 06/09/2020 136  135 - 145 mmol/L Final  . Potassium 06/09/2020 3.2* 3.5 - 5.1 mmol/L Final  . Chloride 06/09/2020 100  98 - 111 mmol/L Final  . CO2 06/09/2020 27  22 - 32 mmol/L Final  . Glucose, Bld 06/09/2020 104* 70 - 99 mg/dL Final   Glucose reference range applies only to samples taken after fasting for at least 8 hours.  . BUN 06/09/2020 8  8 - 23 mg/dL Final  . Creatinine, Ser 06/09/2020 0.77  0.44 - 1.00 mg/dL Final  . Calcium 52/84/1324 8.9  8.9 - 10.3 mg/dL Final  . GFR calc non Af Amer 06/09/2020 >60  >60 mL/min Final  . GFR calc Af Amer 06/09/2020 >60  >60 mL/min Final  . Anion gap 06/09/2020 9  5 - 15 Final   Performed at [facility], [address], [place], [place] [address]  . WBC 06/09/2020 4.2  4.0 - 10.5 K/uL Final  . RBC 06/09/2020 3.68* 3.87 - 5.11 MIL/uL Final  . Hemoglobin 06/09/2020 12.2  12.0 - 15.0 g/dL Final  . HCT 72/53/6644 37.1  36 - 46 % Final  . MCV 06/09/2020 100.8* 80.0 - 100.0 fL Final  . MCH 06/09/2020 33.2  26.0 - 34.0 pg Final  . MCHC 06/09/2020 32.9  30.0 - 36.0 g/dL Final  . RDW 03/47/4259 13.3  11.5 - 15.5 % Final  . Platelets 06/09/2020 311  150 - 400 K/uL Final  . nRBC 06/09/2020 0.0  0.0 - 0.2 % Final   Performed at [facility], [address], [place], [place] [address]  . Magnesium 06/09/2020 1.8  1.7 - 2.4 mg/dL Final   Performed at [facility], [address], [place], [place] [address]  Admission on 06/08/2020, Discharged on 06/09/2020  Component Date Value Ref Range Status  . SARS Coronavirus 2 06/09/2020 NEGATIVE  NEGATIVE Final   Comment: (NOTE) SARS-CoV-2 target nucleic acids are NOT DETECTED.  The SARS-CoV-2 RNA is generally detectable in upper and lower respiratory specimens during the acute phase of infection. The lowest concentration of SARS-CoV-2 viral copies this assay can detect is 250 copies / mL. A negative result does not preclude SARS-CoV-2 infection and should not be used as the sole basis for treatment or other patient management decisions.  A negative result may occur with improper specimen collection / handling, submission of specimen other than nasopharyngeal swab, presence of viral mutation(s) within the areas targeted by this assay, and inadequate number of viral copies (<250 copies / mL). A negative result must be combined with clinical observations, patient history, and epidemiological information.  Fact Sheet for Patients:   BoilerBrush.com.cy  Fact Sheet for Healthcare Providers: https://pope.com/  This test is not yet approved or                           cleared by the [place] FDA and has been authorized for detection and/or diagnosis of SARS-CoV-2 by FDA under an Emergency Use Authorization (EUA).  This EUA will remain in effect (meaning this test can be used) for the duration of the COVID-19 declaration under Section 564(b)(1) of the Act, 21 U.S.C. section  360bbb-3(b)(1), unless the authorization is terminated or revoked sooner.  Performed at [facility], [address], [place], [place] [address]   . WBC 06/09/2020 6.9  4.0 - 10.5 K/uL Final  . RBC 06/09/2020 3.64* 3.87 - 5.11  MIL/uL Final  . Hemoglobin 06/09/2020 12.2  12.0 - 15.0 g/dL Final  . HCT 95/62/1308 37.7  36 - 46 % Final  . MCV 06/09/2020 103.6* 80.0 - 100.0 fL Final  . MCH 06/09/2020 33.5  26.0 - 34.0 pg Final  . MCHC 06/09/2020 32.4  30.0 - 36.0 g/dL Final  . RDW 65/78/4696 13.3  11.5 - 15.5 % Final  . Platelets 06/09/2020 302  150 - 400 K/uL Final  . nRBC 06/09/2020 0.0  0.0 - 0.2 % Final  . Neutrophils Relative % 06/09/2020 67  % Final  . Neutro Abs 06/09/2020 4.5  1.7 - 7.7 K/uL Final  . Lymphocytes Relative 06/09/2020 25  % Final  . Lymphs Abs 06/09/2020 1.7  0.7 - 4.0 K/uL Final  . Monocytes Relative 06/09/2020 7  % Final  . Monocytes Absolute 06/09/2020 0.5  0.1 - 1.0 K/uL Final  . Eosinophils Relative 06/09/2020 1  % Final  . Eosinophils Absolute 06/09/2020 0.1  0.0 - 0.5 K/uL Final  . Basophils Relative 06/09/2020 0  % Final  . Basophils Absolute 06/09/2020 0.0  0.0 - 0.1 K/uL Final  . Immature Granulocytes 06/09/2020 0  % Final  . Abs Immature Granulocytes 06/09/2020 0.03  0.00 - 0.07 K/uL Final   Performed at [facility], [address] [place], [place] [address]  . Sodium 06/09/2020 130* 135 - 145 mmol/L Final  . Potassium 06/09/2020 2.6* 3.5 - 5.1 mmol/L Final   Comment: CRITICAL RESULT CALLED TO, READ BACK BY AND VERIFIED WITH: L.LONDON RN @ [id number] 06/09/20 BY C.EDENS   . Chloride 06/09/2020 94* 98 - 111 mmol/L Final  . CO2 06/09/2020 21* 22 - 32 mmol/L Final  . Glucose, Bld 06/09/2020 122* 70 - 99 mg/dL Final   Glucose reference range applies only to samples taken after fasting for at least 8 hours.  . BUN 06/09/2020 9  8 - 23 mg/dL Final  . Creatinine, Ser 06/09/2020 0.93  0.44 - 1.00 mg/dL Final  . Calcium 44/10/270 9.0  8.9 - 10.3 mg/dL Final  . Total Protein 06/09/2020 6.3* 6.5 - 8.1 g/dL Final  . Albumin 53/66/4403 3.2* 3.5 - 5.0 g/dL Final  . AST 47/42/5956 24  15 - 41 U/L Final  . ALT 06/09/2020 11  0 - 44 U/L Final  . Alkaline Phosphatase 06/09/2020 71  38 -  126 U/L Final  . Total Bilirubin 06/09/2020 0.6  0.3 - 1.2 mg/dL Final  . GFR calc non Af Amer 06/09/2020 >60  >60 mL/min Final  . GFR calc Af Amer 06/09/2020 >60  >60 mL/min Final  . Anion gap 06/09/2020 15  5 - 15 Final   Performed at [facility], [address] [place], [place] [address]  . Hgb A1c MFr Bld 06/09/2020 5.3  4.8 - 5.6 % Final   Comment: (NOTE) Pre diabetes:          5.7%-6.4%  Diabetes:              >6.4%  Glycemic control for   <7.0% adults with diabetes   . Mean Plasma Glucose 06/09/2020 105.41  mg/dL Final   Performed at [facility], [address], [place], [place] [address]  .  Magnesium 06/09/2020 1.6* 1.7 - 2.4 mg/dL Final   Performed at [facility], [address] [place], [place] [address]  . Alcohol, Ethyl (B) 06/09/2020 <10  <10 mg/dL Final   Comment: (NOTE) Lowest detectable limit for serum alcohol is 10 mg/dL.  For medical purposes only. Performed at [facility], [address], [place], [place] [address]   . Cholesterol 06/09/2020 148  0 - 200 mg/dL Final  . Triglycerides 06/09/2020 79  <150 mg/dL Final  . HDL 09/81/1914 74  >40 mg/dL Final  . Total CHOL/HDL Ratio 06/09/2020 2.0  RATIO Final  . VLDL 06/09/2020 16  0 - 40 mg/dL Final  . LDL Cholesterol 06/09/2020 58  0 - 99 mg/dL Final   Comment:        Total Cholesterol/HDL:CHD Risk Coronary Heart Disease Risk Table                     Men   Women  1/2 Average Risk   3.4   3.3  Average Risk       5.0   4.4  2 X Average Risk   9.6   7.1  3 X Average Risk  23.4   11.0        Use the calculated Patient Ratio above and the CHD Risk Table to determine the patient's CHD Risk.        ATP III CLASSIFICATION (LDL):  <100     mg/dL   Optimal  782-956  mg/dL   Near or Above                    Optimal  130-159  mg/dL   Borderline  213-086  mg/dL   High  >578     mg/dL   Very High Performed at [facility], [address] [place], [place] [address]   . TSH  06/09/2020 2.625  0.350 - 4.500 uIU/mL Final   Comment: Performed by a 3rd Generation assay with a functional sensitivity of <=0.01 uIU/mL. Performed at [facility], [address], [place], [place] [address]   . POC Amphetamine UR 06/09/2020 None Detected  None Detected Preliminary  . POC Secobarbital (BAR) 06/09/2020 None Detected  None Detected Preliminary  . POC Buprenorphine (BUP) 06/09/2020 None Detected  None Detected Preliminary  . POC Oxazepam (BZO) 06/09/2020 None Detected  None Detected Preliminary  . POC Cocaine UR 06/09/2020 None Detected  None Detected Preliminary  . POC Methamphetamine UR 06/09/2020 None Detected  None Detected Preliminary  . POC Morphine 06/09/2020 Positive* None Detected Preliminary  . POC Oxycodone UR 06/09/2020 None Detected  None Detected Preliminary  . POC Methadone UR 06/09/2020 None Detected  None Detected Preliminary  . POC Marijuana UR 06/09/2020 None Detected  None Detected Preliminary  . SARS Coronavirus 2 Ag 06/09/2020 Negative  Negative Preliminary  . SARS Coronavirus 2 Ag 06/09/2020 NEGATIVE  NEGATIVE Final   Comment: (NOTE) SARS-CoV-2 antigen NOT DETECTED.   Negative results are presumptive.  Negative results do not preclude SARS-CoV-2 infection and should not be used as the sole basis for treatment or other patient management decisions, including infection  control decisions, particularly in the presence of clinical signs and  symptoms consistent with COVID-19, or in those who have been in contact with the virus.  Negative results must be combined with clinical observations, patient history, and epidemiological information. The expected result is Negative.  Fact Sheet for Patients: https://sanders-williams.net/  Fact Sheet for Healthcare Providers: https://martinez.com/   This test is not yet approved or cleared by the [place] FDA and  has been authorized for detection and/or diagnosis of  SARS-CoV-2 by FDA under an Emergency Use Authorization (EUA).  This EUA will remain in effect (meaning this test can be used) for the duration of  the C                          OVID-19 declaration under Section 564(b)(1) of the Act, 21 U.S.C. section 360bbb-3(b)(1), unless the authorization is terminated or revoked sooner.    . Bilirubin, Direct 06/09/2020 0.1  0.0 - 0.2 mg/dL Final   Performed at [facility], [address] [place], [place] [address]  Admission on 06/05/2020, Discharged on 06/06/2020  Component Date Value Ref Range Status  . Lipase 06/05/2020 34  11 - 51 U/L Final   Performed at [facility], [address] [place], [place] [address]  . Sodium 06/05/2020 131* 135 - 145 mmol/L Final  . Potassium 06/05/2020 3.1* 3.5 - 5.1 mmol/L Final  . Chloride 06/05/2020 96* 98 - 111 mmol/L Final  . CO2 06/05/2020 23  22 - 32 mmol/L Final  . Glucose, Bld 06/05/2020 98  70 - 99 mg/dL Final   Glucose reference range applies only to samples taken after fasting for at least 8 hours.  . BUN 06/05/2020 <5* 8 - 23 mg/dL Final  . Creatinine, Ser 06/05/2020 0.71  0.44 - 1.00 mg/dL Final  . Calcium 53/61/4431 9.1  8.9 - 10.3 mg/dL Final  . Total Protein 06/05/2020 6.8  6.5 - 8.1 g/dL Final  . Albumin 54/00/8676 3.5  3.5 - 5.0 g/dL Final  . AST 19/50/9326 24  15 - 41 U/L Final  . ALT 06/05/2020 10  0 - 44 U/L Final  . Alkaline Phosphatase 06/05/2020 79  38 - 126 U/L Final  . Total Bilirubin 06/05/2020 0.6  0.3 - 1.2 mg/dL Final  . GFR calc non Af Amer 06/05/2020 >60  >60 mL/min Final  . GFR calc Af Amer 06/05/2020 >60  >60 mL/min Final  . Anion gap 06/05/2020 12  5 - 15 Final   Performed at [facility], [address] [place], [place] [address]  . WBC 06/05/2020 5.5  4.0 - 10.5 K/uL Final  . RBC 06/05/2020 4.45  3.87 - 5.11 MIL/uL Final  . Hemoglobin 06/05/2020 14.6  12.0 - 15.0 g/dL Final  . HCT 80/99/8338 44.5  36 - 46 % Final  . MCV 06/05/2020 100.0  80.0 -  100.0 fL Final  . MCH 06/05/2020 32.8  26.0 - 34.0 pg Final  . MCHC 06/05/2020 32.8  30.0 - 36.0 g/dL Final  . RDW 25/02/3975 13.4  11.5 - 15.5 % Final  . Platelets 06/05/2020 283  150 - 400 K/uL Final  . nRBC 06/05/2020 0.0  0.0 - 0.2 % Final   Performed at [facility], [address] [place], [place] [address]  . Color, Urine 06/05/2020 STRAW* YELLOW Final  . APPearance 06/05/2020 CLEAR  CLEAR Final  . Specific Gravity, Urine 06/05/2020 1.002* 1.005 - 1.030 Final  . pH 06/05/2020 6.0  5.0 - 8.0 Final  . Glucose, UA 06/05/2020 NEGATIVE  NEGATIVE mg/dL Final  . Hgb urine dipstick 06/05/2020 NEGATIVE  NEGATIVE Final  . Bilirubin Urine 06/05/2020 NEGATIVE  NEGATIVE Final  . Ketones, ur 06/05/2020 NEGATIVE  NEGATIVE mg/dL Final  .  Protein, ur 06/05/2020 NEGATIVE  NEGATIVE mg/dL Final  . Nitrite 16/07/9603 NEGATIVE  NEGATIVE Final  . Glori Luis 06/05/2020 NEGATIVE  NEGATIVE Final   Performed at [facility], [address], [place], [place] [address]  . Glucose-Capillary 06/05/2020 94  70 - 99 mg/dL Final   Glucose reference range applies only to samples taken after fasting for at least 8 hours.  Admission on 03/23/2020, Discharged on 03/23/2020  Component Date Value Ref Range Status  . Lipase 03/23/2020 30  11 - 51 U/L Final   Performed at [facility], [address], [place], [place] [address]  . Sodium 03/23/2020 137  135 - 145 mmol/L Final  . Potassium 03/23/2020 3.9  3.5 - 5.1 mmol/L Final  . Chloride 03/23/2020 104  98 - 111 mmol/L Final  . CO2 03/23/2020 22  22 - 32 mmol/L Final  . Glucose, Bld 03/23/2020 88  70 - 99 mg/dL Final   Glucose reference range applies only to samples taken after fasting for at least 8 hours.  . BUN 03/23/2020 5* 8 - 23 mg/dL Final  . Creatinine, Ser 03/23/2020 0.55  0.44 - 1.00 mg/dL Final  . Calcium 78/29/5621 9.3  8.9 - 10.3 mg/dL Final  . Total Protein 03/23/2020 7.1  6.5 - 8.1 g/dL Final  . Albumin 30/86/5784 4.0  3.5 - 5.0  g/dL Final  . AST 69/62/9528 57* 15 - 41 U/L Final  . ALT 03/23/2020 62* 0 - 44 U/L Final  . Alkaline Phosphatase 03/23/2020 120  38 - 126 U/L Final  . Total Bilirubin 03/23/2020 0.4  0.3 - 1.2 mg/dL Final  . GFR calc non Af Amer 03/23/2020 >60  >60 mL/min Final  . GFR calc Af Amer 03/23/2020 >60  >60 mL/min Final  . Anion gap 03/23/2020 11  5 - 15 Final   Performed at [facility], [address] [place], [place] [address]  . WBC 03/23/2020 5.7  4.0 - 10.5 K/uL Final  . RBC 03/23/2020 4.25  3.87 - 5.11 MIL/uL Final  . Hemoglobin 03/23/2020 13.8  12.0 - 15.0 g/dL Final  . HCT 40/07/2724 41.0  36 - 46 % Final  . MCV 03/23/2020 96.5  80.0 - 100.0 fL Final  . MCH 03/23/2020 32.5  26.0 - 34.0 pg Final  . MCHC 03/23/2020 33.7  30.0 - 36.0 g/dL Final  . RDW 36/64/4034 13.2  11.5 - 15.5 % Final  . Platelets 03/23/2020 366  150 - 400 K/uL Final  . nRBC 03/23/2020 0.0  0.0 - 0.2 % Final   Performed at [facility], [address], [place], [place] [address]  . Color, Urine 03/23/2020 STRAW* YELLOW Final  . APPearance 03/23/2020 CLEAR  CLEAR Final  . Specific Gravity, Urine 03/23/2020 1.002* 1.005 - 1.030 Final  . pH 03/23/2020 5.0  5.0 - 8.0 Final  . Glucose, UA 03/23/2020 NEGATIVE  NEGATIVE mg/dL Final  . Hgb urine dipstick 03/23/2020 NEGATIVE  NEGATIVE Final  . Bilirubin Urine 03/23/2020 NEGATIVE  NEGATIVE Final  . Ketones, ur 03/23/2020 NEGATIVE  NEGATIVE mg/dL Final  . Protein, ur 56/38/7564 NEGATIVE  NEGATIVE mg/dL Final  . Nitrite 33/29/5188 NEGATIVE  NEGATIVE Final  . Glori Luis 03/23/2020 NEGATIVE  NEGATIVE Final   Performed at [facility], [address], [place], [place] [address]  Admission on 03/11/2020, Discharged on 03/12/2020  Component Date Value Ref Range Status  . Lipase 03/11/2020 32  11 - 51 U/L Final  Performed at [facility], [address] [place], [place] [address]  . Sodium 03/11/2020 138  135 - 145 mmol/L Final  . Potassium  03/11/2020 3.7  3.5 - 5.1 mmol/L Final  . Chloride 03/11/2020 105  98 - 111 mmol/L Final  . CO2 03/11/2020 22  22 - 32 mmol/L Final  . Glucose, Bld 03/11/2020 90  70 - 99 mg/dL Final   Glucose reference range applies only to samples taken after fasting for at least 8 hours.  . BUN 03/11/2020 6* 8 - 23 mg/dL Final  . Creatinine, Ser 03/11/2020 0.61  0.44 - 1.00 mg/dL Final  . Calcium 40/98/1191 9.6  8.9 - 10.3 mg/dL Final  . Total Protein 03/11/2020 6.9  6.5 - 8.1 g/dL Final  . Albumin 47/82/9562 4.0  3.5 - 5.0 g/dL Final  . AST 13/05/6577 56* 15 - 41 U/L Final  . ALT 03/11/2020 37  0 - 44 U/L Final  . Alkaline Phosphatase 03/11/2020 68  38 - 126 U/L Final  . Total Bilirubin 03/11/2020 0.4  0.3 - 1.2 mg/dL Final  . GFR calc non Af Amer 03/11/2020 >60  >60 mL/min Final  . GFR calc Af Amer 03/11/2020 >60  >60 mL/min Final  . Anion gap 03/11/2020 11  5 - 15 Final   Performed at [facility], [address] [place], [place] [address]  . WBC 03/11/2020 6.2  4.0 - 10.5 K/uL Final  . RBC 03/11/2020 4.02  3.87 - 5.11 MIL/uL Final  . Hemoglobin 03/11/2020 13.0  12.0 - 15.0 g/dL Final  . HCT 95/28/4132 40.0  36 - 46 % Final  . MCV 03/11/2020 99.5  80.0 - 100.0 fL Final  . MCH 03/11/2020 32.3  26.0 - 34.0 pg Final  . MCHC 03/11/2020 32.5  30.0 - 36.0 g/dL Final  . RDW 44/10/270 13.5  11.5 - 15.5 % Final  . Platelets 03/11/2020 422* 150 - 400 K/uL Final  . nRBC 03/11/2020 0.0  0.0 - 0.2 % Final   Performed at [facility], [address] [place], [place] [address]  . Color, Urine 03/11/2020 YELLOW  YELLOW Final  . APPearance 03/11/2020 CLEAR  CLEAR Final  . Specific Gravity, Urine 03/11/2020 <1.005* 1.005 - 1.030 Final  . pH 03/11/2020 5.5  5.0 - 8.0 Final  . Glucose, UA 03/11/2020 NEGATIVE  NEGATIVE mg/dL Final  . Hgb urine dipstick 03/11/2020 NEGATIVE  NEGATIVE Final  . Bilirubin Urine 03/11/2020 NEGATIVE  NEGATIVE Final  . Ketones, ur 03/11/2020 NEGATIVE  NEGATIVE mg/dL Final   . Protein, ur 40/34/7425 NEGATIVE  NEGATIVE mg/dL Final  . Nitrite 95/63/8756 POSITIVE* NEGATIVE Final  . Glori Luis 03/11/2020 NEGATIVE  NEGATIVE Final   Performed at [facility], [address] [place], [place] [address]  . RBC / HPF 03/11/2020 NONE SEEN  0 - 5 RBC/hpf Final  . WBC, UA 03/11/2020 NONE SEEN  0 - 5 WBC/hpf Final  . Bacteria, UA 03/11/2020 NONE SEEN  NONE SEEN Final  . Squamous Epithelial / LPF 03/11/2020 0-5  0 - 5 Final   Performed at [facility], [address], [place], [place] [address]  Admission on 02/28/2020, Discharged on 03/04/2020  Component Date Value Ref Range Status  . Lipase 02/28/2020 33  11 - 51 U/L Final   Performed at [facility], [address] [place], [place] [address]  . Sodium 02/28/2020 139  135 - 145 mmol/L Final  .  Potassium 02/28/2020 3.8  3.5 - 5.1 mmol/L Final  . Chloride 02/28/2020 101  98 - 111 mmol/L Final  . CO2 02/28/2020 25  22 - 32 mmol/L Final  . Glucose, Bld 02/28/2020 89  70 - 99 mg/dL Final   Glucose reference range applies only to samples taken after fasting for at least 8 hours.  . BUN 02/28/2020 6* 8 - 23 mg/dL Final  . Creatinine, Ser 02/28/2020 0.71  0.44 - 1.00 mg/dL Final  . Calcium 16/07/9603 9.9  8.9 - 10.3 mg/dL Final  . Total Protein 02/28/2020 7.4  6.5 - 8.1 g/dL Final  . Albumin 54/06/8118 4.2  3.5 - 5.0 g/dL Final  . AST 14/78/2956 33  15 - 41 U/L Final  . ALT 02/28/2020 28  0 - 44 U/L Final  . Alkaline Phosphatase 02/28/2020 65  38 - 126 U/L Final  . Total Bilirubin 02/28/2020 0.8  0.3 - 1.2 mg/dL Final  . GFR calc non Af Amer 02/28/2020 >60  >60 mL/min Final  . GFR calc Af Amer 02/28/2020 >60  >60 mL/min Final  . Anion gap 02/28/2020 13  5 - 15 Final   Performed at [facility], [address] [place], [place] [address]  . WBC 02/28/2020 6.4  4.0 - 10.5 K/uL Final  . RBC 02/28/2020 4.17  3.87 - 5.11 MIL/uL Final  . Hemoglobin 02/28/2020 13.5  12.0 - 15.0 g/dL Final  . HCT  65/78/4696 42.2  36 - 46 % Final  . MCV 02/28/2020 101.2* 80.0 - 100.0 fL Final  . MCH 02/28/2020 32.4  26.0 - 34.0 pg Final  . MCHC 02/28/2020 32.0  30.0 - 36.0 g/dL Final  . RDW 29/52/8413 14.6  11.5 - 15.5 % Final  . Platelets 02/28/2020 262  150 - 400 K/uL Final  . nRBC 02/28/2020 0.0  0.0 - 0.2 % Final   Performed at [facility], [address], [place], [place] [address]  . Color, Urine 02/28/2020 YELLOW  YELLOW Final  . APPearance 02/28/2020 CLEAR  CLEAR Final  . Specific Gravity, Urine 02/28/2020 1.003* 1.005 - 1.030 Final  . pH 02/28/2020 6.0  5.0 - 8.0 Final  . Glucose, UA 02/28/2020 NEGATIVE  NEGATIVE mg/dL Final  . Hgb urine dipstick 02/28/2020 NEGATIVE  NEGATIVE Final  . Bilirubin Urine 02/28/2020 NEGATIVE  NEGATIVE Final  . Ketones, ur 02/28/2020 NEGATIVE  NEGATIVE mg/dL Final  . Protein, ur 02/72/5366 NEGATIVE  NEGATIVE mg/dL Final  . Nitrite 44/12/4740 NEGATIVE  NEGATIVE Final  . Glori Luis 02/28/2020 NEGATIVE  NEGATIVE Final   Performed at [facility], [address], [place], [place] [address]  . Troponin I (High Sensitivity) 02/28/2020 3  <18 ng/L Final   Comment: (NOTE) Elevated high sensitivity troponin I (hsTnI) values and significant  changes across serial measurements may suggest ACS but many other  chronic and acute conditions are known to elevate hsTnI results.  Refer to the "Links" section for chest pain algorithms and additional  guidance. Performed at [facility], [address], [place], [place] [address]   . Troponin I (High Sensitivity) 02/28/2020 4  <18 ng/L Final   Comment: (NOTE) Elevated high sensitivity troponin I (hsTnI) values and significant  changes across serial measurements may suggest ACS but many other  chronic and acute conditions are known to elevate hsTnI results.  Refer to the "Links" section for chest pain algorithms and additional  guidance. Performed at [facility], [address],  [place], [place] [address]   . SARS Coronavirus 2 02/28/2020 NEGATIVE  NEGATIVE Final   Comment: (NOTE) SARS-CoV-2 target nucleic acids are NOT DETECTED. The SARS-CoV-2 RNA is generally detectable in upper and lower respiratory specimens during the acute phase of infection. The lowest concentration of SARS-CoV-2 viral copies this assay can detect is 250 copies / mL. A negative result does not preclude SARS-CoV-2 infection and should not be used as the sole basis for treatment or other patient management decisions.  A negative result may occur with improper specimen collection / handling, submission of specimen other than nasopharyngeal swab, presence of viral mutation(s) within the areas targeted by this assay, and inadequate number of viral copies (<250 copies / mL). A negative result must be combined with clinical observations, patient history, and epidemiological information. Fact Sheet for Patients:   BoilerBrush.com.cy Fact Sheet for Healthcare Providers: https://pope.com/ This test is not yet approved or cleared                           by the [place] FDA and has been authorized for detection and/or diagnosis of SARS-CoV-2 by FDA under an Emergency Use Authorization (EUA).  This EUA will remain in effect (meaning this test can be used) for the duration of the COVID-19 declaration under Section 564(b)(1) of the Act, 21 U.S.C. section 360bbb-3(b)(1), unless the authorization is terminated or revoked sooner. Performed at [facility], [address], [place], [place] [address]   . Sodium 02/29/2020 139  135 - 145 mmol/L Final  . Potassium 02/29/2020 3.7  3.5 - 5.1 mmol/L Final  . Chloride 02/29/2020 105  98 - 111 mmol/L Final  . CO2 02/29/2020 21* 22 - 32 mmol/L Final  . Glucose, Bld 02/29/2020 98  70 - 99 mg/dL Final   Glucose reference range applies only to samples taken after fasting for at least 8 hours.  . BUN  02/29/2020 6* 8 - 23 mg/dL Final  . Creatinine, Ser 02/29/2020 0.67  0.44 - 1.00 mg/dL Final  . Calcium 82/95/6213 9.4  8.9 - 10.3 mg/dL Final  . GFR calc non Af Amer 02/29/2020 >60  >60 mL/min Final  . GFR calc Af Amer 02/29/2020 >60  >60 mL/min Final  . Anion gap 02/29/2020 13  5 - 15 Final   Performed at [facility], [address] [place], [place] [address]  . WBC 02/29/2020 4.5  4.0 - 10.5 K/uL Final  . RBC 02/29/2020 4.30  3.87 - 5.11 MIL/uL Final  . Hemoglobin 02/29/2020 14.0  12.0 - 15.0 g/dL Final  . HCT 84/69/6295 43.9  36 - 46 % Final  . MCV 02/29/2020 102.1* 80.0 - 100.0 fL Final  . MCH 02/29/2020 32.6  26.0 - 34.0 pg Final  . MCHC 02/29/2020 31.9  30.0 - 36.0 g/dL Final  . RDW 28/41/3244 14.7  11.5 - 15.5 % Final  . Platelets 02/29/2020 264  150 - 400 K/uL Final  . nRBC 02/29/2020 0.0  0.0 - 0.2 % Final   Performed at [facility], [address] [place], [place] [address]  . WBC 03/01/2020 4.4  4.0 - 10.5 K/uL Final  . RBC 03/01/2020 3.85* 3.87 - 5.11 MIL/uL Final  . Hemoglobin 03/01/2020 12.3  12.0 - 15.0 g/dL Final  . HCT 25/36/6440 40.1  36 - 46 % Final  . MCV 03/01/2020 104.2* 80.0 - 100.0 fL Final  . [facility] 03/01/2020  31.9  26.0 - 34.0 pg Final  . MCHC 03/01/2020 30.7  30.0 - 36.0 g/dL Final  . RDW 32/44/0102 14.2  11.5 - 15.5 % Final  . Platelets 03/01/2020 220  150 - 400 K/uL Final  . nRBC 03/01/2020 0.0  0.0 - 0.2 % Final   Performed at [facility], [address] [place], [place] [address]  . Sodium 03/01/2020 145  135 - 145 mmol/L Final  . Potassium 03/01/2020 5.0  3.5 - 5.1 mmol/L Final   NO VISIBLE HEMOLYSIS  . Chloride 03/01/2020 112* 98 - 111 mmol/L Final  . CO2 03/01/2020 25  22 - 32 mmol/L Final  . Glucose, Bld 03/01/2020 88  70 - 99 mg/dL Final   Glucose reference range applies only to samples taken after fasting for at least 8 hours.  . BUN 03/01/2020 <5* 8 - 23 mg/dL Final  . Creatinine, Ser 03/01/2020 0.71  0.44 - 1.00 mg/dL  Final  . Calcium 64/40/3474 9.3  8.9 - 10.3 mg/dL Final  . GFR calc non Af Amer 03/01/2020 >60  >60 mL/min Final  . GFR calc Af Amer 03/01/2020 >60  >60 mL/min Final  . Anion gap 03/01/2020 8  5 - 15 Final   Performed at [facility], [address], [place], [place] [address]  . Magnesium 03/01/2020 1.7  1.7 - 2.4 mg/dL Final   Performed at [facility], [address], [place], [place] [address]  . WBC 03/02/2020 3.7* 4.0 - 10.5 K/uL Final  . RBC 03/02/2020 3.71* 3.87 - 5.11 MIL/uL Final  . Hemoglobin 03/02/2020 11.9* 12.0 - 15.0 g/dL Final  . HCT 43/32/9518 37.8  36 - 46 % Final  . MCV 03/02/2020 101.9* 80.0 - 100.0 fL Final  . MCH 03/02/2020 32.1  26.0 - 34.0 pg Final  . MCHC 03/02/2020 31.5  30.0 - 36.0 g/dL Final  . RDW 84/16/6063 13.6  11.5 - 15.5 % Final  . Platelets 03/02/2020 242  150 - 400 K/uL Final  . nRBC 03/02/2020 0.0  0.0 - 0.2 % Final   Performed at [facility], [address], [place], [place] [address]  . Sodium 03/02/2020 142  135 - 145 mmol/L Final  . Potassium 03/02/2020 4.2  3.5 - 5.1 mmol/L Final  . Chloride 03/02/2020 107  98 - 111 mmol/L Final  . CO2 03/02/2020 27  22 - 32 mmol/L Final  . Glucose, Bld 03/02/2020 84  70 - 99 mg/dL Final   Glucose reference range applies only to samples taken after fasting for at least 8 hours.  . BUN 03/02/2020 <5* 8 - 23 mg/dL Final  . Creatinine, Ser 03/02/2020 0.67  0.44 - 1.00 mg/dL Final  . Calcium 09/32/3557 9.3  8.9 - 10.3 mg/dL Final  . GFR calc non Af Amer 03/02/2020 >60  >60 mL/min Final  . GFR calc Af Amer 03/02/2020 >60  >60 mL/min Final  . Anion gap 03/02/2020 8  5 - 15 Final   Performed at [facility], [address] [place], [place] [address]  . WBC 03/04/2020 3.8* 4.0 - 10.5 K/uL Final  . RBC 03/04/2020 3.73* 3.87 - 5.11 MIL/uL Final  . Hemoglobin 03/04/2020 12.3  12.0 - 15.0 g/dL Final  . HCT 54/27/0623 37.3  36 - 46 % Final  . MCV 03/04/2020 100.0  80.0 - 100.0 fL Final  . MCH  03/04/2020 33.0  26.0 - 34.0 pg Final  . MCHC  03/04/2020 33.0  30.0 - 36.0 g/dL Final  . RDW 57/26/2035 14.0  11.5 - 15.5 % Final  . Platelets 03/04/2020 280  150 - 400 K/uL Final  . nRBC 03/04/2020 0.0  0.0 - 0.2 % Final   Performed at [facility], [address], [place], [place] [address]    Allergies: Cephalexin, Cephalosporins, Ketorolac, Prednisone, and Tramadol  PTA Medications: (Not in a hospital admission)   Medical Decision Making    Recommendations  Based on my evaluation the patient does not appear to have an emergency medical condition.  The patient is a safety risk to herself and currently is requiring psychiatric inpatient admission for stabilization and treatment.  Gillermo Murdoch, NP 09/02/20  6:39 AM

## 2020-09-02 NOTE — ED Notes (Signed)
Patient states she wants to detox. Patient given support and encouragement.

## 2020-09-02 NOTE — Progress Notes (Addendum)
Patient has been accepted to [facility] on Tuesday, 09/03/20 at 7:45am for a screening for admission appointment.   Patient has been notified to contact Mrs. June [phone], admissions) to complete her phone screening, prior to her appointment tomorrow morning. Patient expressed understanding and ensured CSW that she would call this evening.    Patient will need to be transported to [facility] via [employer]. Patient is expected to be at facility by 7:45am.   Facility address is : [address] [employer] [address], [place], [place] [address]. For any additional questions, call [phone] and ask for Mrs. June.     Reola Calkins, NP notified.  Cecile Sheerer, RN notified.     Baldo Daub, MSW, LCSW Clinical Social Worker [facility]

## 2020-09-02 NOTE — Discharge Instructions (Signed)

## 2020-09-02 NOTE — ED Provider Notes (Signed)
Behavioral Health Progress Note  Date and Time: 09/02/2020 4:47 PM Name: [name] MRN:  [id number]  Subjective: Patient reports today that she is feeling better.  She states that her biggest concern is trying to get off of alcohol again.  She states she has been to multiple rehabs and detox facilities.  She states that she feels that she needs to go somewhere to stay for a while.  She states that her husband is also abuses alcohol and she has not been able to stay with him because she feels that his daughter became involved and moved him out of the house and now she has nowhere to stay.  She reports that she relapsed after becoming depressed and been at [facility] around other people who are drinking.  Patient states that she needs a safe place to stay in order to become sober again and stay clean.  Patient states that she is interested in going to [facility] or to [facility] residential.  Objective: Patient presents calm, cooperative, and pleasant.  Patient is denying any suicidal homicidal ideations and denies any hallucinations.  Patient is more interested in substance abuse treatment and mainly in residential treatment.  Social work assisted today with getting patient placed at [facility] residential and plan is for patient to discharge tomorrow morning and arrive there at 7:45 AM.  Patient stated understanding and agreement to plan and continued to deny any suicidal or homicidal ideations.  Patient also did telephone interview with intake specialist at residential rehab facility.  Diagnosis:  Final diagnoses:  MDD (major depressive disorder), recurrent episode, moderate (HCC)  Alcohol abuse    Total Time spent with patient: 30 minutes  Past Psychiatric History: Alcohol abuse, MDD, multiple hospitalizations, multiple rehab facility admissions Past Medical History:  Past Medical History:  Diagnosis Date  . Alcohol dependence (HCC)   . Anxiety   . Chronic pain   . Depression   . Diverticulitis    . Herniated cervical disc   . Pancreatitis   . Seizures (HCC)    alcoholic seizures    Past Surgical History:  Procedure Laterality Date  . ABDOMINAL HYSTERECTOMY    . ANKLE SURGERY Right   . CARPAL TUNNEL RELEASE Bilateral   . CERVICAL FUSION    . CHOLECYSTECTOMY    . KNEE SURGERY     Family History: History reviewed. No pertinent family history. Family Psychiatric  History: None reported Social History:  Social History   Substance and Sexual Activity  Alcohol Use Yes   Comment: drinks a fifth of vodka daily     Social History   Substance and Sexual Activity  Drug Use Not Currently  . Types: Marijuana    Social History   Socioeconomic History  . Marital status: Single    Spouse name: Not on file  . Number of children: Not on file  . Years of education: Not on file  . Highest education level: Not on file  Occupational History  . Not on file  Tobacco Use  . Smoking status: Current Every Day Smoker    Packs/day: 0.50    Years: 7.00    Pack years: 3.50    Types: Cigarettes  . Smokeless tobacco: Never Used  Vaping Use  . Vaping Use: Never used  Substance and Sexual Activity  . Alcohol use: Yes    Comment: drinks a fifth of vodka daily  . Drug use: Not Currently    Types: Marijuana  . Sexual activity: Not Currently  Other Topics  Concern  . Not on file  Social History Narrative   Pt lives in a [employer]; lives with boyfriend.   Social Determinants of Health   Financial Resource Strain:   . Difficulty of Paying Living Expenses: Not on file  Food Insecurity:   . Worried About Programme researcher, broadcasting/film/video in the Last Year: Not on file  . Ran Out of Food in the Last Year: Not on file  Transportation Needs:   . Lack of Transportation (Medical): Not on file  . Lack of Transportation (Non-Medical): Not on file  Physical Activity:   . Days of Exercise per Week: Not on file  . Minutes of Exercise per Session: Not on file  Stress:   . Feeling of Stress : Not on file   Social Connections:   . Frequency of Communication with Friends and Family: Not on file  . Frequency of Social Gatherings with Friends and Family: Not on file  . Attends Religious Services: Not on file  . Active Member of Clubs or Organizations: Not on file  . Attends Banker Meetings: Not on file  . Marital Status: Not on file   SDOH:  SDOH Screenings   Alcohol Screen:   . Last Alcohol Screening Score (AUDIT): Not on file  Depression (PHQ2-9): Medium Risk  . PHQ-2 Score: 27  Financial Resource Strain:   . Difficulty of Paying Living Expenses: Not on file  Food Insecurity:   . Worried About Programme researcher, broadcasting/film/video in the Last Year: Not on file  . Ran Out of Food in the Last Year: Not on file  Housing:   . Last Housing Risk Score: Not on file  Physical Activity:   . Days of Exercise per Week: Not on file  . Minutes of Exercise per Session: Not on file  Social Connections:   . Frequency of Communication with Friends and Family: Not on file  . Frequency of Social Gatherings with Friends and Family: Not on file  . Attends Religious Services: Not on file  . Active Member of Clubs or Organizations: Not on file  . Attends Banker Meetings: Not on file  . Marital Status: Not on file  Stress:   . Feeling of Stress : Not on file  Tobacco Use: High Risk  . Smoking Tobacco Use: Current Every Day Smoker  . Smokeless Tobacco Use: Never Used  Transportation Needs:   . Freight forwarder (Medical): Not on file  . Lack of Transportation (Non-Medical): Not on file   Additional Social History:                         Sleep: Good  Appetite:  Good  Current Medications:  Current Facility-Administered Medications  Medication Dose Route Frequency Provider Last Rate Last Admin  . acetaminophen (TYLENOL) tablet 650 mg  650 mg Oral Q6H PRN Gillermo Murdoch, NP   650 mg at 09/02/20 1631  . alum & mag hydroxide-simeth (MAALOX/MYLANTA) 200-200-20 MG/5ML  suspension 30 mL  30 mL Oral Q4H PRN Gillermo Murdoch, NP      . gabapentin (NEURONTIN) capsule 300 mg  300 mg Oral TID Milana Salay, [name], FNP   300 mg at 09/02/20 1630  . hydrOXYzine (ATARAX/VISTARIL) tablet 25 mg  25 mg Oral Q6H PRN Gillermo Murdoch, NP      . loperamide (IMODIUM) capsule 2-4 mg  2-4 mg Oral PRN Gillermo Murdoch, NP      . LORazepam (ATIVAN)  tablet 1 mg  1 mg Oral Q6H PRN Gillermo Murdoch, NP      . LORazepam (ATIVAN) tablet 1 mg  1 mg Oral QID Gillermo Murdoch, NP   1 mg at 09/02/20 1433   Followed by  . [START ON 09/03/2020] LORazepam (ATIVAN) tablet 1 mg  1 mg Oral TID Gillermo Murdoch, NP       Followed by  . [START ON 09/04/2020] LORazepam (ATIVAN) tablet 1 mg  1 mg Oral BID Gillermo Murdoch, NP       Followed by  . [START ON 09/06/2020] LORazepam (ATIVAN) tablet 1 mg  1 mg Oral Daily Gillermo Murdoch, NP      . magnesium hydroxide (MILK OF MAGNESIA) suspension 30 mL  30 mL Oral Daily PRN Gillermo Murdoch, NP      . multivitamin with minerals tablet 1 tablet  1 tablet Oral Daily Gillermo Murdoch, NP   1 tablet at 09/02/20 0925  . ondansetron (ZOFRAN-ODT) disintegrating tablet 4 mg  4 mg Oral Q6H PRN Gillermo Murdoch, NP   4 mg at 09/02/20 0538  . pantoprazole (PROTONIX) EC tablet 40 mg  40 mg Oral Daily Evalin Shawhan, [name], FNP   40 mg at 09/02/20 0925  . [START ON 09/03/2020] thiamine tablet 100 mg  100 mg Oral Daily Gillermo Murdoch, NP       Current Outpatient Medications  Medication Sig Dispense Refill  . acetaminophen-codeine (TYLENOL #3) 300-30 MG tablet 1 tab every 6-8 hours prn pain (Patient taking differently: Take 1 tablet by mouth every 6 (six) hours as needed for moderate pain. ) 30 tablet 0  . FLUoxetine (PROZAC) 20 MG capsule Take 20 mg by mouth at bedtime.     . gabapentin (NEURONTIN) 800 MG tablet Take 800 mg by mouth 3 (three) times daily. Takes 2 capsules in the morning, 1 capsule in the afternoon and 1 capsule  at night    . pantoprazole (PROTONIX) 40 MG tablet Take 1 tablet (40 mg total) by mouth daily. 30 tablet 0    Labs  Lab Results:  Admission on 09/02/2020  Component Date Value Ref Range Status  . SARS Coronavirus 2 Ag 09/02/2020 Negative  Negative Preliminary  . SARS Coronavirus 2 by RT PCR 09/02/2020 NEGATIVE  NEGATIVE Final   Comment: (NOTE) SARS-CoV-2 target nucleic acids are NOT DETECTED.  The SARS-CoV-2 RNA is generally detectable in upper respiratoy specimens during the acute phase of infection. The lowest concentration of SARS-CoV-2 viral copies this assay can detect is 131 copies/mL. A negative result does not preclude SARS-Cov-2 infection and should not be used as the sole basis for treatment or other patient management decisions. A negative result may occur with  improper specimen collection/handling, submission of specimen other than nasopharyngeal swab, presence of viral mutation(s) within the areas targeted by this assay, and inadequate number of viral copies (<131 copies/mL). A negative result must be combined with clinical observations, patient history, and epidemiological information. The expected result is Negative.  Fact Sheet for Patients:  https://www.moore.com/  Fact Sheet for Healthcare Providers:  https://www.young.biz/  This test is no                          t yet approved or cleared by the [place] FDA and  has been authorized for detection and/or diagnosis of SARS-CoV-2 by FDA under an Emergency Use Authorization (EUA). This EUA will remain  in effect (meaning this test can be used) for the  duration of the COVID-19 declaration under Section 564(b)(1) of the Act, 21 U.S.C. section 360bbb-3(b)(1), unless the authorization is terminated or revoked sooner.    . Influenza A by PCR 09/02/2020 NEGATIVE  NEGATIVE Final  . Influenza B by PCR 09/02/2020 NEGATIVE  NEGATIVE Final   Comment: (NOTE) The Xpert Xpress  SARS-CoV-2/FLU/RSV assay is intended as an aid in  the diagnosis of influenza from Nasopharyngeal swab specimens and  should not be used as a sole basis for treatment. Nasal washings and  aspirates are unacceptable for Xpert Xpress SARS-CoV-2/FLU/RSV  testing.  Fact Sheet for Patients: https://www.moore.com/  Fact Sheet for Healthcare Providers: https://www.young.biz/  This test is not yet approved or cleared by the [place] FDA and  has been authorized for detection and/or diagnosis of SARS-CoV-2 by  FDA under an Emergency Use Authorization (EUA). This EUA will remain  in effect (meaning this test can be used) for the duration of the  Covid-19 declaration under Section 564(b)(1) of the Act, 21  U.S.C. section 360bbb-3(b)(1), unless the authorization is  terminated or revoked. Performed at [facility], [address], [place], [place] [address]   Admission on 09/01/2020, Discharged on 09/02/2020  Component Date Value Ref Range Status  . Lipase 09/01/2020 25  11 - 51 U/L Final   Performed at [facility], [address], [place], [place] [address]  . Sodium 09/01/2020 136  135 - 145 mmol/L Final  . Potassium 09/01/2020 3.1* 3.5 - 5.1 mmol/L Final  . Chloride 09/01/2020 100  98 - 111 mmol/L Final  . CO2 09/01/2020 16* 22 - 32 mmol/L Final  . Glucose, Bld 09/01/2020 175* 70 - 99 mg/dL Final   Glucose reference range applies only to samples taken after fasting for at least 8 hours.  . BUN 09/01/2020 21  8 - 23 mg/dL Final  . Creatinine, Ser 09/01/2020 0.78  0.44 - 1.00 mg/dL Final  . Calcium 13/05/6577 8.9  8.9 - 10.3 mg/dL Final  . Total Protein 09/01/2020 7.3  6.5 - 8.1 g/dL Final  . Albumin 46/96/2952 4.4  3.5 - 5.0 g/dL Final  . AST 84/13/2440 54* 15 - 41 U/L Final  . ALT 09/01/2020 36  0 - 44 U/L Final  . Alkaline Phosphatase 09/01/2020 93  38 - 126 U/L Final  . Total Bilirubin 09/01/2020 0.5  0.3 - 1.2  mg/dL Final  . GFR, Estimated 09/01/2020 >60  >60 mL/min Final   Comment: (NOTE) Calculated using the CKD-EPI Creatinine Equation (2021)   . Anion gap 09/01/2020 20* 5 - 15 Final   Performed at [facility], [address] [place], [place] [address]  . WBC 09/01/2020 12.3* 4.0 - 10.5 K/uL Final  . RBC 09/01/2020 4.43  3.87 - 5.11 MIL/uL Final  . Hemoglobin 09/01/2020 14.1  12.0 - 15.0 g/dL Final  . HCT 53/66/4403 42.1  36 - 46 % Final  . MCV 09/01/2020 95.0  80.0 - 100.0 fL Final  . MCH 09/01/2020 31.8  26.0 - 34.0 pg Final  . MCHC 09/01/2020 33.5  30.0 - 36.0 g/dL Final  . RDW 47/42/5956 13.3  11.5 - 15.5 % Final  . Platelets 09/01/2020 318  150 - 400 K/uL Final  . nRBC 09/01/2020 0.0  0.0 - 0.2 % Final   Performed at [facility], [address], [place], [place] [address]  . Color, Urine 09/01/2020 YELLOW  YELLOW Final  . APPearance 09/01/2020 HAZY* CLEAR Final  .  Specific Gravity, Urine 09/01/2020 1.026  1.005 - 1.030 Final  . pH 09/01/2020 5.0  5.0 - 8.0 Final  . Glucose, UA 09/01/2020 NEGATIVE  NEGATIVE mg/dL Final  . Hgb urine dipstick 09/01/2020 NEGATIVE  NEGATIVE Final  . Bilirubin Urine 09/01/2020 NEGATIVE  NEGATIVE Final  . Ketones, ur 09/01/2020 5* NEGATIVE mg/dL Final  . Protein, ur 16/07/9603 NEGATIVE  NEGATIVE mg/dL Final  . Nitrite 54/06/8118 NEGATIVE  NEGATIVE Final  . Glori Luis 09/01/2020 NEGATIVE  NEGATIVE Final   Performed at [facility], [address] [place], [place] [address]  . Alcohol, Ethyl (B) 09/01/2020 253* <10 mg/dL Final   Comment: (NOTE) Lowest detectable limit for serum alcohol is 10 mg/dL.  For medical purposes only. Performed at [facility], [address] [place], [place] [address]   . Opiates 09/02/2020 NONE DETECTED  NONE DETECTED Final  . Cocaine 09/02/2020 NONE DETECTED  NONE DETECTED Final  . Benzodiazepines 09/02/2020 NONE DETECTED  NONE DETECTED  Final  . Amphetamines 09/02/2020 NONE DETECTED  NONE DETECTED Final  . Tetrahydrocannabinol 09/02/2020 POSITIVE* NONE DETECTED Final  . Barbiturates 09/02/2020 NONE DETECTED  NONE DETECTED Final   Comment: (NOTE) DRUG SCREEN FOR MEDICAL PURPOSES ONLY.  IF CONFIRMATION IS NEEDED FOR ANY PURPOSE, NOTIFY LAB WITHIN 5 DAYS.  LOWEST DETECTABLE LIMITS FOR URINE DRUG SCREEN Drug Class                     Cutoff (ng/mL) Amphetamine and metabolites    1000 Barbiturate and metabolites    200 Benzodiazepine                 200 Tricyclics and metabolites     300 Opiates and metabolites        300 Cocaine and metabolites        300 THC                            50 Performed at [facility], [address] [place], [place] [address]   . Sodium 09/02/2020 138  135 - 145 mmol/L Final  . Potassium 09/02/2020 3.7  3.5 - 5.1 mmol/L Final  . Chloride 09/02/2020 105  98 - 111 mmol/L Final  . CO2 09/02/2020 19* 22 - 32 mmol/L Final  . Glucose, Bld 09/02/2020 63* 70 - 99 mg/dL Final   Glucose reference range applies only to samples taken after fasting for at least 8 hours.  . BUN 09/02/2020 15  8 - 23 mg/dL Final  . Creatinine, Ser 09/02/2020 0.70  0.44 - 1.00 mg/dL Final  . Calcium 78/46/9629 8.3* 8.9 - 10.3 mg/dL Final  . GFR, Estimated 09/02/2020 >60  >60 mL/min Final   Comment: (NOTE) Calculated using the CKD-EPI Creatinine Equation (2021)   . Anion gap 09/02/2020 14  5 - 15 Final   Performed at [facility], [address], [place], [place] [address]  . Magnesium 09/02/2020 1.8  1.7 - 2.4 mg/dL Final   Performed at [facility], [address], [place], [place] [address]  . Phosphorus 09/02/2020 3.1  2.5 - 4.6 mg/dL Final   Performed at [facility], [address], [place], [place] [address]  . SARS Coronavirus 2 by RT PCR 09/02/2020 NEGATIVE  NEGATIVE Final   Comment: (NOTE) SARS-CoV-2 target nucleic acids are  NOT DETECTED.  The SARS-CoV-2 RNA is generally detectable in upper respiratoy specimens  during the acute phase of infection. The lowest concentration of SARS-CoV-2 viral copies this assay can detect is 131 copies/mL. A negative result does not preclude SARS-Cov-2 infection and should not be used as the sole basis for treatment or other patient management decisions. A negative result may occur with  improper specimen collection/handling, submission of specimen other than nasopharyngeal swab, presence of viral mutation(s) within the areas targeted by this assay, and inadequate number of viral copies (<131 copies/mL). A negative result must be combined with clinical observations, patient history, and epidemiological information. The expected result is Negative.  Fact Sheet for Patients:  https://www.moore.com/  Fact Sheet for Healthcare Providers:  https://www.young.biz/  This test is no                          t yet approved or cleared by the [place] FDA and  has been authorized for detection and/or diagnosis of SARS-CoV-2 by FDA under an Emergency Use Authorization (EUA). This EUA will remain  in effect (meaning this test can be used) for the duration of the COVID-19 declaration under Section 564(b)(1) of the Act, 21 U.S.C. section 360bbb-3(b)(1), unless the authorization is terminated or revoked sooner.    . Influenza A by PCR 09/02/2020 NEGATIVE  NEGATIVE Final  . Influenza B by PCR 09/02/2020 NEGATIVE  NEGATIVE Final   Comment: (NOTE) The Xpert Xpress SARS-CoV-2/FLU/RSV assay is intended as an aid in  the diagnosis of influenza from Nasopharyngeal swab specimens and  should not be used as a sole basis for treatment. Nasal washings and  aspirates are unacceptable for Xpert Xpress SARS-CoV-2/FLU/RSV  testing.  Fact Sheet for Patients: https://www.moore.com/  Fact Sheet for Healthcare  Providers: https://www.young.biz/  This test is not yet approved or cleared by the [place] FDA and  has been authorized for detection and/or diagnosis of SARS-CoV-2 by  FDA under an Emergency Use Authorization (EUA). This EUA will remain  in effect (meaning this test can be used) for the duration of the  Covid-19 declaration under Section 564(b)(1) of the Act, 21  U.S.C. section 360bbb-3(b)(1), unless the authorization is  terminated or revoked. Performed at [facility], [address] [place], [place] [address]   Admission on 06/09/2020, Discharged on 06/09/2020  Component Date Value Ref Range Status  . Sodium 06/09/2020 136  135 - 145 mmol/L Final  . Potassium 06/09/2020 3.2* 3.5 - 5.1 mmol/L Final  . Chloride 06/09/2020 100  98 - 111 mmol/L Final  . CO2 06/09/2020 27  22 - 32 mmol/L Final  . Glucose, Bld 06/09/2020 104* 70 - 99 mg/dL Final   Glucose reference range applies only to samples taken after fasting for at least 8 hours.  . BUN 06/09/2020 8  8 - 23 mg/dL Final  . Creatinine, Ser 06/09/2020 0.77  0.44 - 1.00 mg/dL Final  . Calcium 60/45/4098 8.9  8.9 - 10.3 mg/dL Final  . GFR calc non Af Amer 06/09/2020 >60  >60 mL/min Final  . GFR calc Af Amer 06/09/2020 >60  >60 mL/min Final  . Anion gap 06/09/2020 9  5 - 15 Final   Performed at [facility], [address] [place], [place] [address]  . WBC 06/09/2020 4.2  4.0 - 10.5 K/uL Final  . RBC 06/09/2020 3.68* 3.87 - 5.11 MIL/uL Final  . Hemoglobin 06/09/2020 12.2  12.0 - 15.0 g/dL Final  . HCT 78/29/5621 37.1  36 - 46 % Final  .  MCV 06/09/2020 100.8* 80.0 - 100.0 fL Final  . MCH 06/09/2020 33.2  26.0 - 34.0 pg Final  . MCHC 06/09/2020 32.9  30.0 - 36.0 g/dL Final  . RDW 13/05/6577 13.3  11.5 - 15.5 % Final  . Platelets 06/09/2020 311  150 - 400 K/uL Final  . nRBC 06/09/2020 0.0  0.0 - 0.2 % Final   Performed at [facility], [address] [place], [place] [address]  . Magnesium 06/09/2020 1.8  1.7 - 2.4 mg/dL Final   Performed at [facility], [address] [place], [place] [address]  Admission on 06/08/2020, Discharged on 06/09/2020  Component Date Value Ref Range Status  . SARS Coronavirus 2 06/09/2020 NEGATIVE  NEGATIVE Final   Comment: (NOTE) SARS-CoV-2 target nucleic acids are NOT DETECTED.  The SARS-CoV-2 RNA is generally detectable in upper and lower respiratory specimens during the acute phase of infection. The lowest concentration of SARS-CoV-2 viral copies this assay can detect is 250 copies / mL. A negative result does not preclude SARS-CoV-2 infection and should not be used as the sole basis for treatment or other patient management decisions.  A negative result may occur with improper specimen collection / handling, submission of specimen other than nasopharyngeal swab, presence of viral mutation(s) within the areas targeted by this assay, and inadequate number of viral copies (<250 copies / mL). A negative result must be combined with clinical observations, patient history, and epidemiological information.  Fact Sheet for Patients:   BoilerBrush.com.cy  Fact Sheet for Healthcare Providers: https://pope.com/  This test is not yet approved or                           cleared by the [place] FDA and has been authorized for detection and/or diagnosis of SARS-CoV-2 by FDA under an Emergency Use Authorization (EUA).  This EUA will remain in effect (meaning this test can be used) for the duration of the COVID-19 declaration under Section 564(b)(1) of the Act, 21 U.S.C. section 360bbb-3(b)(1), unless the authorization is terminated or revoked sooner.  Performed at [facility], [address], [place], [place] [address]   . WBC 06/09/2020 6.9  4.0 - 10.5 K/uL Final  . RBC 06/09/2020 3.64* 3.87 - 5.11 MIL/uL Final  .  Hemoglobin 06/09/2020 12.2  12.0 - 15.0 g/dL Final  . HCT 10/21/7251 37.7  36 - 46 % Final  . MCV 06/09/2020 103.6* 80.0 - 100.0 fL Final  . MCH 06/09/2020 33.5  26.0 - 34.0 pg Final  . MCHC 06/09/2020 32.4  30.0 - 36.0 g/dL Final  . RDW 66/44/0347 13.3  11.5 - 15.5 % Final  . Platelets 06/09/2020 302  150 - 400 K/uL Final  . nRBC 06/09/2020 0.0  0.0 - 0.2 % Final  . Neutrophils Relative % 06/09/2020 67  % Final  . Neutro Abs 06/09/2020 4.5  1.7 - 7.7 K/uL Final  . Lymphocytes Relative 06/09/2020 25  % Final  . Lymphs Abs 06/09/2020 1.7  0.7 - 4.0 K/uL Final  . Monocytes Relative 06/09/2020 7  % Final  . Monocytes Absolute 06/09/2020 0.5  0.1 - 1.0 K/uL Final  . Eosinophils Relative 06/09/2020 1  % Final  . Eosinophils Absolute 06/09/2020 0.1  0.0 - 0.5 K/uL Final  . Basophils Relative 06/09/2020 0  % Final  . Basophils Absolute 06/09/2020 0.0  0.0 - 0.1 K/uL Final  . Immature Granulocytes 06/09/2020 0  %  Final  . Abs Immature Granulocytes 06/09/2020 0.03  0.00 - 0.07 K/uL Final   Performed at [facility], [address] [place], [place] [address]  . Sodium 06/09/2020 130* 135 - 145 mmol/L Final  . Potassium 06/09/2020 2.6* 3.5 - 5.1 mmol/L Final   Comment: CRITICAL RESULT CALLED TO, READ BACK BY AND VERIFIED WITH: L.LONDON RN @ 0254 06/09/20 BY C.EDENS   . Chloride 06/09/2020 94* 98 - 111 mmol/L Final  . CO2 06/09/2020 21* 22 - 32 mmol/L Final  . Glucose, Bld 06/09/2020 122* 70 - 99 mg/dL Final   Glucose reference range applies only to samples taken after fasting for at least 8 hours.  . BUN 06/09/2020 9  8 - 23 mg/dL Final  . Creatinine, Ser 06/09/2020 0.93  0.44 - 1.00 mg/dL Final  . Calcium 27/03/2375 9.0  8.9 - 10.3 mg/dL Final  . Total Protein 06/09/2020 6.3* 6.5 - 8.1 g/dL Final  . Albumin 28/31/5176 3.2* 3.5 - 5.0 g/dL Final  . AST 16/04/3709 24  15 - 41 U/L Final  . ALT 06/09/2020 11  0 - 44 U/L Final  . Alkaline Phosphatase 06/09/2020 71  38 - 126 U/L Final  .  Total Bilirubin 06/09/2020 0.6  0.3 - 1.2 mg/dL Final  . GFR calc non Af Amer 06/09/2020 >60  >60 mL/min Final  . GFR calc Af Amer 06/09/2020 >60  >60 mL/min Final  . Anion gap 06/09/2020 15  5 - 15 Final   Performed at [facility], [address], [place], [place] [address]  . Hgb A1c MFr Bld 06/09/2020 5.3  4.8 - 5.6 % Final   Comment: (NOTE) Pre diabetes:          5.7%-6.4%  Diabetes:              >6.4%  Glycemic control for   <7.0% adults with diabetes   . Mean Plasma Glucose 06/09/2020 105.41  mg/dL Final   Performed at [facility], [address], [place], [place] [address]  . Magnesium 06/09/2020 1.6* 1.7 - 2.4 mg/dL Final   Performed at [facility], [address] [place], [place] [address]  . Alcohol, Ethyl (B) 06/09/2020 <10  <10 mg/dL Final   Comment: (NOTE) Lowest detectable limit for serum alcohol is 10 mg/dL.  For medical purposes only. Performed at [facility] [facility], [address], [place], [place] [address]   . Cholesterol 06/09/2020 148  0 - 200 mg/dL Final  . Triglycerides 06/09/2020 79  <150 mg/dL Final  . HDL 82/99/3716 74  >40 mg/dL Final  . Total CHOL/HDL Ratio 06/09/2020 2.0  RATIO Final  . VLDL 06/09/2020 16  0 - 40 mg/dL Final  . LDL Cholesterol 06/09/2020 58  0 - 99 mg/dL Final   Comment:        Total Cholesterol/HDL:CHD Risk Coronary Heart Disease Risk Table                     Men   Women  1/2 Average Risk   3.4   3.3  Average Risk       5.0   4.4  2 X Average Risk   9.6   7.1  3 X Average Risk  23.4   11.0        Use the calculated Patient Ratio above and the CHD Risk Table to determine the patient's CHD Risk.  ATP III CLASSIFICATION (LDL):  <100     mg/dL   Optimal  161-096  mg/dL   Near or Above                    Optimal  130-159  mg/dL   Borderline  045-409  mg/dL   High  >811     mg/dL   Very High Performed at [facility], [address] [place], [place] [address]   . TSH 06/09/2020 2.625   0.350 - 4.500 uIU/mL Final   Comment: Performed by a 3rd Generation assay with a functional sensitivity of <=0.01 uIU/mL. Performed at [facility], [address], [place], [place] [address]   . POC Amphetamine UR 06/09/2020 None Detected  None Detected Preliminary  . POC Secobarbital (BAR) 06/09/2020 None Detected  None Detected Preliminary  . POC Buprenorphine (BUP) 06/09/2020 None Detected  None Detected Preliminary  . POC Oxazepam (BZO) 06/09/2020 None Detected  None Detected Preliminary  . POC Cocaine UR 06/09/2020 None Detected  None Detected Preliminary  . POC Methamphetamine UR 06/09/2020 None Detected  None Detected Preliminary  . POC Morphine 06/09/2020 Positive* None Detected Preliminary  . POC Oxycodone UR 06/09/2020 None Detected  None Detected Preliminary  . POC Methadone UR 06/09/2020 None Detected  None Detected Preliminary  . POC Marijuana UR 06/09/2020 None Detected  None Detected Preliminary  . SARS Coronavirus 2 Ag 06/09/2020 Negative  Negative Preliminary  . SARS Coronavirus 2 Ag 06/09/2020 NEGATIVE  NEGATIVE Final   Comment: (NOTE) SARS-CoV-2 antigen NOT DETECTED.   Negative results are presumptive.  Negative results do not preclude SARS-CoV-2 infection and should not be used as the sole basis for treatment or other patient management decisions, including infection  control decisions, particularly in the presence of clinical signs and  symptoms consistent with COVID-19, or in those who have been in contact with the virus.  Negative results must be combined with clinical observations, patient history, and epidemiological information. The expected result is Negative.  Fact Sheet for Patients: https://sanders-williams.net/  Fact Sheet for Healthcare Providers: https://martinez.com/   This test is not yet approved or cleared by the [place] FDA and  has been authorized for detection and/or diagnosis of SARS-CoV-2 by FDA  under an Emergency Use Authorization (EUA).  This EUA will remain in effect (meaning this test can be used) for the duration of  the C                          OVID-19 declaration under Section 564(b)(1) of the Act, 21 U.S.C. section 360bbb-3(b)(1), unless the authorization is terminated or revoked sooner.    . Bilirubin, Direct 06/09/2020 0.1  0.0 - 0.2 mg/dL Final   Performed at [facility], [address] [place], [place] [address]  Admission on 06/05/2020, Discharged on 06/06/2020  Component Date Value Ref Range Status  . Lipase 06/05/2020 34  11 - 51 U/L Final   Performed at [facility], [address], [place], [place] [address]  . Sodium 06/05/2020 131* 135 - 145 mmol/L Final  . Potassium 06/05/2020 3.1* 3.5 - 5.1 mmol/L Final  . Chloride 06/05/2020 96* 98 - 111 mmol/L Final  . CO2 06/05/2020 23  22 - 32 mmol/L Final  . Glucose, Bld 06/05/2020 98  70 - 99 mg/dL Final   Glucose reference range applies only to samples taken after fasting for at least 8  hours.  . BUN 06/05/2020 <5* 8 - 23 mg/dL Final  . Creatinine, Ser 06/05/2020 0.71  0.44 - 1.00 mg/dL Final  . Calcium 01/56/1537 9.1  8.9 - 10.3 mg/dL Final  . Total Protein 06/05/2020 6.8  6.5 - 8.1 g/dL Final  . Albumin 94/32/7614 3.5  3.5 - 5.0 g/dL Final  . AST 70/92/9574 24  15 - 41 U/L Final  . ALT 06/05/2020 10  0 - 44 U/L Final  . Alkaline Phosphatase 06/05/2020 79  38 - 126 U/L Final  . Total Bilirubin 06/05/2020 0.6  0.3 - 1.2 mg/dL Final  . GFR calc non Af Amer 06/05/2020 >60  >60 mL/min Final  . GFR calc Af Amer 06/05/2020 >60  >60 mL/min Final  . Anion gap 06/05/2020 12  5 - 15 Final   Performed at [facility], [address] [place], [place] [address]  . WBC 06/05/2020 5.5  4.0 - 10.5 K/uL Final  . RBC 06/05/2020 4.45  3.87 - 5.11 MIL/uL Final  . Hemoglobin 06/05/2020 14.6  12.0 - 15.0 g/dL Final  . HCT 70/96/4383 44.5  36 - 46 % Final  . MCV 06/05/2020 100.0  80.0 - 100.0 fL Final  . MCH  06/05/2020 32.8  26.0 - 34.0 pg Final  . MCHC 06/05/2020 32.8  30.0 - 36.0 g/dL Final  . RDW 81/84/0375 13.4  11.5 - 15.5 % Final  . Platelets 06/05/2020 283  150 - 400 K/uL Final  . nRBC 06/05/2020 0.0  0.0 - 0.2 % Final   Performed at [facility], [address] [place], [place] [address]  . Color, Urine 06/05/2020 STRAW* YELLOW Final  . APPearance 06/05/2020 CLEAR  CLEAR Final  . Specific Gravity, Urine 06/05/2020 1.002* 1.005 - 1.030 Final  . pH 06/05/2020 6.0  5.0 - 8.0 Final  . Glucose, UA 06/05/2020 NEGATIVE  NEGATIVE mg/dL Final  . Hgb urine dipstick 06/05/2020 NEGATIVE  NEGATIVE Final  . Bilirubin Urine 06/05/2020 NEGATIVE  NEGATIVE Final  . Ketones, ur 06/05/2020 NEGATIVE  NEGATIVE mg/dL Final  . Protein, ur 77/12/4033 NEGATIVE  NEGATIVE mg/dL Final  . Nitrite 24/81/8590 NEGATIVE  NEGATIVE Final  . Glori Luis 06/05/2020 NEGATIVE  NEGATIVE Final   Performed at [facility], [address], [place], [place] [address]  . Glucose-Capillary 06/05/2020 94  70 - 99 mg/dL Final   Glucose reference range applies only to samples taken after fasting for at least 8 hours.  Admission on 03/23/2020, Discharged on 03/23/2020  Component Date Value Ref Range Status  . Lipase 03/23/2020 30  11 - 51 U/L Final   Performed at [facility], [address] [place], [place] [address]  . Sodium 03/23/2020 137  135 - 145 mmol/L Final  . Potassium 03/23/2020 3.9  3.5 - 5.1 mmol/L Final  . Chloride 03/23/2020 104  98 - 111 mmol/L Final  . CO2 03/23/2020 22  22 - 32 mmol/L Final  . Glucose, Bld 03/23/2020 88  70 - 99 mg/dL Final   Glucose reference range applies only to samples taken after fasting for at least 8 hours.  . BUN 03/23/2020 5* 8 - 23 mg/dL Final  . Creatinine, Ser 03/23/2020 0.55  0.44 - 1.00 mg/dL Final  . Calcium 69/50/7225 9.3  8.9 - 10.3 mg/dL Final  . Total Protein 03/23/2020 7.1  6.5 - 8.1 g/dL Final  . Albumin 75/02/1832 4.0  3.5 - 5.0 g/dL Final  . AST  58/25/1898 57*  15 - 41 U/L Final  . ALT 03/23/2020 62* 0 - 44 U/L Final  . Alkaline Phosphatase 03/23/2020 120  38 - 126 U/L Final  . Total Bilirubin 03/23/2020 0.4  0.3 - 1.2 mg/dL Final  . GFR calc non Af Amer 03/23/2020 >60  >60 mL/min Final  . GFR calc Af Amer 03/23/2020 >60  >60 mL/min Final  . Anion gap 03/23/2020 11  5 - 15 Final   Performed at [facility], [address] [place], [place] [id number]  . WBC 03/23/2020 5.7  4.0 - 10.5 K/uL Final  . RBC 03/23/2020 4.25  3.87 - 5.11 MIL/uL Final  . Hemoglobin 03/23/2020 13.8  12.0 - 15.0 g/dL Final  . HCT 02/72/536606/02/2020 41.0  36 - 46 % Final  . MCV 03/23/2020 96.5  80.0 - 100.0 fL Final  . MCH 03/23/2020 32.5  26.0 - 34.0 pg Final  . MCHC 03/23/2020 33.7  30.0 - 36.0 g/dL Final  . RDW 44/03/474206/02/2020 13.2  11.5 - 15.5 % Final  . Platelets 03/23/2020 366  150 - 400 K/uL Final  . nRBC 03/23/2020 0.0  0.0 - 0.2 % Final   Performed at [facility], [address] [place], [place] [id number]  . Color, Urine 03/23/2020 STRAW* YELLOW Final  . APPearance 03/23/2020 CLEAR  CLEAR Final  . Specific Gravity, Urine 03/23/2020 1.002* 1.005 - 1.030 Final  . pH 03/23/2020 5.0  5.0 - 8.0 Final  . Glucose, UA 03/23/2020 NEGATIVE  NEGATIVE mg/dL Final  . Hgb urine dipstick 03/23/2020 NEGATIVE  NEGATIVE Final  . Bilirubin Urine 03/23/2020 NEGATIVE  NEGATIVE Final  . Ketones, ur 03/23/2020 NEGATIVE  NEGATIVE mg/dL Final  . Protein, ur 87/56/433206/02/2020 NEGATIVE  NEGATIVE mg/dL Final  . Nitrite 95/18/841606/02/2020 NEGATIVE  NEGATIVE Final  . Glori LuisLeukocytes,Ua 03/23/2020 NEGATIVE  NEGATIVE Final   Performed at [facility], [address] [place], [place] [address]  Admission on 03/11/2020, Discharged on 03/12/2020  Component Date Value Ref Range Status  . Lipase 03/11/2020 32  11 - 51 U/L Final   Performed at [facility], [address] [place], [place] [address]  . Sodium 03/11/2020 138  135 - 145 mmol/L Final  . Potassium 03/11/2020 3.7  3.5 - 5.1  mmol/L Final  . Chloride 03/11/2020 105  98 - 111 mmol/L Final  . CO2 03/11/2020 22  22 - 32 mmol/L Final  . Glucose, Bld 03/11/2020 90  70 - 99 mg/dL Final   Glucose reference range applies only to samples taken after fasting for at least 8 hours.  . BUN 03/11/2020 6* 8 - 23 mg/dL Final  . Creatinine, Ser 03/11/2020 0.61  0.44 - 1.00 mg/dL Final  . Calcium 93/23/557305/24/2021 9.6  8.9 - 10.3 mg/dL Final  . Total Protein 03/11/2020 6.9  6.5 - 8.1 g/dL Final  . Albumin 22/02/542705/24/2021 4.0  3.5 - 5.0 g/dL Final  . AST 06/23/762805/24/2021 56* 15 - 41 U/L Final  . ALT 03/11/2020 37  0 - 44 U/L Final  . Alkaline Phosphatase 03/11/2020 68  38 - 126 U/L Final  . Total Bilirubin 03/11/2020 0.4  0.3 - 1.2 mg/dL Final  . GFR calc non Af Amer 03/11/2020 >60  >60 mL/min Final  . GFR calc Af Amer 03/11/2020 >60  >60 mL/min Final  . Anion gap 03/11/2020 11  5 - 15 Final   Performed at [facility], [address] [place], [place] [address]  .  WBC 03/11/2020 6.2  4.0 - 10.5 K/uL Final  . RBC 03/11/2020 4.02  3.87 - 5.11 MIL/uL Final  . Hemoglobin 03/11/2020 13.0  12.0 - 15.0 g/dL Final  . HCT 30/86/5784 40.0  36 - 46 % Final  . MCV 03/11/2020 99.5  80.0 - 100.0 fL Final  . MCH 03/11/2020 32.3  26.0 - 34.0 pg Final  . MCHC 03/11/2020 32.5  30.0 - 36.0 g/dL Final  . RDW 69/62/9528 13.5  11.5 - 15.5 % Final  . Platelets 03/11/2020 422* 150 - 400 K/uL Final  . nRBC 03/11/2020 0.0  0.0 - 0.2 % Final   Performed at [facility], [address], [place], [place] [address]  . Color, Urine 03/11/2020 YELLOW  YELLOW Final  . APPearance 03/11/2020 CLEAR  CLEAR Final  . Specific Gravity, Urine 03/11/2020 <1.005* 1.005 - 1.030 Final  . pH 03/11/2020 5.5  5.0 - 8.0 Final  . Glucose, UA 03/11/2020 NEGATIVE  NEGATIVE mg/dL Final  . Hgb urine dipstick 03/11/2020 NEGATIVE  NEGATIVE Final  . Bilirubin Urine 03/11/2020 NEGATIVE  NEGATIVE Final  . Ketones, ur 03/11/2020 NEGATIVE  NEGATIVE mg/dL Final  . Protein, ur  40/07/2724 NEGATIVE  NEGATIVE mg/dL Final  . Nitrite 36/64/4034 POSITIVE* NEGATIVE Final  . Glori Luis 03/11/2020 NEGATIVE  NEGATIVE Final   Performed at [facility], [address], [place], [place] [address]  . RBC / HPF 03/11/2020 NONE SEEN  0 - 5 RBC/hpf Final  . WBC, UA 03/11/2020 NONE SEEN  0 - 5 WBC/hpf Final  . Bacteria, UA 03/11/2020 NONE SEEN  NONE SEEN Final  . Squamous Epithelial / LPF 03/11/2020 0-5  0 - 5 Final   Performed at [facility], [address] [place], [place] [address]  Admission on 02/28/2020, Discharged on 03/04/2020  Component Date Value Ref Range Status  . Lipase 02/28/2020 33  11 - 51 U/L Final   Performed at [facility], [address], [place], [place] [address]  . Sodium 02/28/2020 139  135 - 145 mmol/L Final  . Potassium 02/28/2020 3.8  3.5 - 5.1 mmol/L Final  . Chloride 02/28/2020 101  98 - 111 mmol/L Final  . CO2 02/28/2020 25  22 - 32 mmol/L Final  . Glucose, Bld 02/28/2020 89  70 - 99 mg/dL Final   Glucose reference range applies only to samples taken after fasting for at least 8 hours.  . BUN 02/28/2020 6* 8 - 23 mg/dL Final  . Creatinine, Ser 02/28/2020 0.71  0.44 - 1.00 mg/dL Final  . Calcium 29/51/8841 9.9  8.9 - 10.3 mg/dL Final  . Total Protein 02/28/2020 7.4  6.5 - 8.1 g/dL Final  . Albumin 66/03/3015 4.2  3.5 - 5.0 g/dL Final  . AST 10/27/3233 33  15 - 41 U/L Final  . ALT 02/28/2020 28  0 - 44 U/L Final  . Alkaline Phosphatase 02/28/2020 65  38 - 126 U/L Final  . Total Bilirubin 02/28/2020 0.8  0.3 - 1.2 mg/dL Final  . GFR calc non Af Amer 02/28/2020 >60  >60 mL/min Final  . GFR calc Af Amer 02/28/2020 >60  >60 mL/min Final  . Anion gap 02/28/2020 13  5 - 15 Final   Performed at [facility], [address] [place], [place] [address]  . WBC 02/28/2020 6.4  4.0 - 10.5 K/uL Final  . RBC 02/28/2020 4.17  3.87 - 5.11 MIL/uL Final  . Hemoglobin 02/28/2020 13.5  12.0 - 15.0 g/dL Final  . HCT 16/07/9603 42.2  36  - 46 % Final  . MCV 02/28/2020 101.2* 80.0 - 100.0 fL Final  . MCH 02/28/2020 32.4  26.0 - 34.0 pg Final  . MCHC 02/28/2020 32.0  30.0 - 36.0 g/dL Final  . RDW 54/06/8118 14.6  11.5 - 15.5 % Final  . Platelets 02/28/2020 262  150 - 400 K/uL Final  . nRBC 02/28/2020 0.0  0.0 - 0.2 % Final   Performed at [facility], [address] [place], [place] [address]  . Color, Urine 02/28/2020 YELLOW  YELLOW Final  . APPearance 02/28/2020 CLEAR  CLEAR Final  . Specific Gravity, Urine 02/28/2020 1.003* 1.005 - 1.030 Final  . pH 02/28/2020 6.0  5.0 - 8.0 Final  . Glucose, UA 02/28/2020 NEGATIVE  NEGATIVE mg/dL Final  . Hgb urine dipstick 02/28/2020 NEGATIVE  NEGATIVE Final  . Bilirubin Urine 02/28/2020 NEGATIVE  NEGATIVE Final  . Ketones, ur 02/28/2020 NEGATIVE  NEGATIVE mg/dL Final  . Protein, ur 95/62/1308 NEGATIVE  NEGATIVE mg/dL Final  . Nitrite 65/78/4696 NEGATIVE  NEGATIVE Final  . Glori Luis 02/28/2020 NEGATIVE  NEGATIVE Final   Performed at [facility], [address] [place], [place] [address]  . Troponin I (High Sensitivity) 02/28/2020 3  <18 ng/L Final   Comment: (NOTE) Elevated high sensitivity troponin I (hsTnI) values and significant  changes across serial measurements may suggest ACS but many other  chronic and acute conditions are known to elevate hsTnI results.  Refer to the "Links" section for chest pain algorithms and additional  guidance. Performed at [facility], [address], [place], [place] [address]   . Troponin I (High Sensitivity) 02/28/2020 4  <18 ng/L Final   Comment: (NOTE) Elevated high sensitivity troponin I (hsTnI) values and significant  changes across serial measurements may suggest ACS but many other  chronic and acute conditions are known to elevate hsTnI results.  Refer to the "Links" section for chest pain algorithms and additional  guidance. Performed at [facility], [address] [place], [place] [address]   .  SARS Coronavirus 2 02/28/2020 NEGATIVE  NEGATIVE Final   Comment: (NOTE) SARS-CoV-2 target nucleic acids are NOT DETECTED. The SARS-CoV-2 RNA is generally detectable in upper and lower respiratory specimens during the acute phase of infection. The lowest concentration of SARS-CoV-2 viral copies this assay can detect is 250 copies / mL. A negative result does not preclude SARS-CoV-2 infection and should not be used as the sole basis for treatment or other patient management decisions.  A negative result may occur with improper specimen collection / handling, submission of specimen other than nasopharyngeal swab, presence of viral mutation(s) within the areas targeted by this assay, and inadequate number of viral copies (<250 copies / mL). A negative result must be combined with clinical observations, patient history, and epidemiological information. Fact Sheet for Patients:   BoilerBrush.com.cy Fact Sheet for Healthcare Providers: https://pope.com/ This test is not yet approved or cleared                           by the [place] FDA and has been authorized for detection and/or diagnosis of SARS-CoV-2 by FDA under an Emergency Use Authorization (EUA).  This EUA will remain in effect (meaning this test can be used) for the duration of the COVID-19 declaration under Section 564(b)(1) of the Act, 21 U.S.C. section 360bbb-3(b)(1), unless  the authorization is terminated or revoked sooner. Performed at [facility], [address] [place], [place] [address]   . Sodium 02/29/2020 139  135 - 145 mmol/L Final  . Potassium 02/29/2020 3.7  3.5 - 5.1 mmol/L Final  . Chloride 02/29/2020 105  98 - 111 mmol/L Final  . CO2 02/29/2020 21* 22 - 32 mmol/L Final  . Glucose, Bld 02/29/2020 98  70 - 99 mg/dL Final   Glucose reference range applies only to samples taken after fasting for at least 8 hours.  . BUN 02/29/2020 6* 8 - 23 mg/dL Final   . Creatinine, Ser 02/29/2020 0.67  0.44 - 1.00 mg/dL Final  . Calcium 60/45/4098 9.4  8.9 - 10.3 mg/dL Final  . GFR calc non Af Amer 02/29/2020 >60  >60 mL/min Final  . GFR calc Af Amer 02/29/2020 >60  >60 mL/min Final  . Anion gap 02/29/2020 13  5 - 15 Final   Performed at [facility], [address] [place], [place] [address]  . WBC 02/29/2020 4.5  4.0 - 10.5 K/uL Final  . RBC 02/29/2020 4.30  3.87 - 5.11 MIL/uL Final  . Hemoglobin 02/29/2020 14.0  12.0 - 15.0 g/dL Final  . HCT 78/29/5621 43.9  36 - 46 % Final  . MCV 02/29/2020 102.1* 80.0 - 100.0 fL Final  . MCH 02/29/2020 32.6  26.0 - 34.0 pg Final  . MCHC 02/29/2020 31.9  30.0 - 36.0 g/dL Final  . RDW 30/86/5784 14.7  11.5 - 15.5 % Final  . Platelets 02/29/2020 264  150 - 400 K/uL Final  . nRBC 02/29/2020 0.0  0.0 - 0.2 % Final   Performed at [facility], [address] [place], [place] [address]  . WBC 03/01/2020 4.4  4.0 - 10.5 K/uL Final  . RBC 03/01/2020 3.85* 3.87 - 5.11 MIL/uL Final  . Hemoglobin 03/01/2020 12.3  12.0 - 15.0 g/dL Final  . HCT 52/84/1324 40.1  36 - 46 % Final  . MCV 03/01/2020 104.2* 80.0 - 100.0 fL Final  . MCH 03/01/2020 31.9  26.0 - 34.0 pg Final  . MCHC 03/01/2020 30.7  30.0 - 36.0 g/dL Final  . RDW 40/07/2724 14.2  11.5 - 15.5 % Final  . Platelets 03/01/2020 220  150 - 400 K/uL Final  . nRBC 03/01/2020 0.0  0.0 - 0.2 % Final   Performed at [facility], [address] [place], [place] [address]  . Sodium 03/01/2020 145  135 - 145 mmol/L Final  . Potassium 03/01/2020 5.0  3.5 - 5.1 mmol/L Final   NO VISIBLE HEMOLYSIS  . Chloride 03/01/2020 112* 98 - 111 mmol/L Final  . CO2 03/01/2020 25  22 - 32 mmol/L Final  . Glucose, Bld 03/01/2020 88  70 - 99 mg/dL Final   Glucose reference range applies only to samples taken after fasting for at least 8 hours.  . BUN 03/01/2020 <5* 8 - 23 mg/dL Final  . Creatinine, Ser 03/01/2020 0.71  0.44 - 1.00 mg/dL Final  . Calcium 03/47/4259 9.3   8.9 - 10.3 mg/dL Final  . GFR calc non Af Amer 03/01/2020 >60  >60 mL/min Final  . GFR calc Af Amer 03/01/2020 >60  >60 mL/min Final  . Anion gap 03/01/2020 8  5 - 15 Final   Performed at [facility], [address], [place], [place] [address]  . Magnesium 03/01/2020 1.7  1.7 - 2.4 mg/dL Final  Performed at [facility], [address] [place], [place] [address]  . WBC 03/02/2020 3.7* 4.0 - 10.5 K/uL Final  . RBC 03/02/2020 3.71* 3.87 - 5.11 MIL/uL Final  . Hemoglobin 03/02/2020 11.9* 12.0 - 15.0 g/dL Final  . HCT 60/45/4098 37.8  36 - 46 % Final  . MCV 03/02/2020 101.9* 80.0 - 100.0 fL Final  . MCH 03/02/2020 32.1  26.0 - 34.0 pg Final  . MCHC 03/02/2020 31.5  30.0 - 36.0 g/dL Final  . RDW 11/91/4782 13.6  11.5 - 15.5 % Final  . Platelets 03/02/2020 242  150 - 400 K/uL Final  . nRBC 03/02/2020 0.0  0.0 - 0.2 % Final   Performed at [facility], [address] [place], [place] [address]  . Sodium 03/02/2020 142  135 - 145 mmol/L Final  . Potassium 03/02/2020 4.2  3.5 - 5.1 mmol/L Final  . Chloride 03/02/2020 107  98 - 111 mmol/L Final  . CO2 03/02/2020 27  22 - 32 mmol/L Final  . Glucose, Bld 03/02/2020 84  70 - 99 mg/dL Final   Glucose reference range applies only to samples taken after fasting for at least 8 hours.  . BUN 03/02/2020 <5* 8 - 23 mg/dL Final  . Creatinine, Ser 03/02/2020 0.67  0.44 - 1.00 mg/dL Final  . Calcium 30/86/5784 9.3  8.9 - 10.3 mg/dL Final  . GFR calc non Af Amer 03/02/2020 >60  >60 mL/min Final  . GFR calc Af Amer 03/02/2020 >60  >60 mL/min Final  . Anion gap 03/02/2020 8  5 - 15 Final   Performed at [facility], [address], [place], [place] [address]  . WBC 03/04/2020 3.8* 4.0 - 10.5 K/uL Final  . RBC 03/04/2020 3.73* 3.87 - 5.11 MIL/uL Final  . Hemoglobin 03/04/2020 12.3  12.0 - 15.0 g/dL Final  . HCT 52/84/1324 37.3  36 - 46 % Final  . MCV 03/04/2020 100.0  80.0 - 100.0 fL Final  . MCH 03/04/2020 33.0  26.0 - 34.0 pg  Final  . MCHC 03/04/2020 33.0  30.0 - 36.0 g/dL Final  . RDW 40/07/2724 14.0  11.5 - 15.5 % Final  . Platelets 03/04/2020 280  150 - 400 K/uL Final  . nRBC 03/04/2020 0.0  0.0 - 0.2 % Final   Performed at [facility], [address], [place], [place] [address]    Blood Alcohol level:  Lab Results  Component Value Date   ETH 253 (H) 09/01/2020   ETH <10 06/09/2020    Metabolic Disorder Labs: Lab Results  Component Value Date   HGBA1C 5.3 06/09/2020   MPG 105.41 06/09/2020   MPG 108 10/23/2009   No results found for: PROLACTIN Lab Results  Component Value Date   CHOL 148 06/09/2020   TRIG 79 06/09/2020   HDL 74 06/09/2020   CHOLHDL 2.0 06/09/2020   VLDL 16 06/09/2020   LDLCALC 58 06/09/2020   LDLCALC  10/23/2009    79        Total Cholesterol/HDL:CHD Risk Coronary Heart Disease Risk Table                     Men   Women  1/2 Average Risk   3.4   3.3  Average Risk       5.0   4.4  2 X Average Risk   9.6   7.1  3 X Average Risk  23.4  11.0        Use the calculated Patient Ratio above and the CHD Risk Table to determine the patient's CHD Risk.        ATP III CLASSIFICATION (LDL):  <100     mg/dL   Optimal  161-096  mg/dL   Near or Above                    Optimal  130-159  mg/dL   Borderline  045-409  mg/dL   High  >811     mg/dL   Very High    Therapeutic Lab Levels: No results found for: LITHIUM No results found for: VALPROATE No components found for:  CBMZ  Physical Findings   AIMS     [facility] Ad[facility] (Dis[facility]  AIMS Total Score 0 0    AUDIT     [facility] Ad[facility] ([facility]  Alcohol Use Disorder Identification Test Final Score (AUDIT) 34 34    PHQ2-9      ED from 06/08/2020 in [facility]  PHQ-2 Total Score 6  PHQ-9 Total Score 27       Musculoskeletal  Strength & Muscle Tone: within normal limits Gait & Station: normal Patient leans: N/A  Psychiatric Specialty Exam  Presentation  General Appearance: Appropriate for Environment;Casual  Eye Contact:Good  Speech:Blocked;Clear and Coherent  Speech Volume:Normal  Handedness:Right   Mood and Affect  Mood:Anxious  Affect:Appropriate;Congruent   Thought Process  Thought Processes:Coherent;Goal Directed  Descriptions of Associations:Intact  Orientation:Full (Time, Place and Person)  Thought Content:WDL  Hallucinations:Hallucinations: None  Ideas of Reference:None  Suicidal Thoughts:Suicidal Thoughts: No SI Active Intent and/or Plan: With Intent;With Plan;With Means to Carry Out;With Access to Means  Homicidal Thoughts:Homicidal Thoughts: No   Sensorium  Memory:Immediate Good;Recent Good;Remote Good  Judgment:Good  Insight:Good   Executive Functions  Concentration:Good  Attention Span:Good  Recall:Good  Fund of Knowledge:Good  Language:Good   Psychomotor Activity  Psychomotor Activity:Psychomotor Activity: Normal   Assets  Assets:Communication Skills;Desire for Improvement;Financial Resources/Insurance;Social Support   Sleep  Sleep:Sleep: Good   Physical Exam  Physical Exam Vitals and nursing note reviewed.  Constitutional:      Appearance: She is well-developed.  HENT:     Head: Normocephalic.  Eyes:     Pupils: Pupils are equal, round, and reactive to light.  Cardiovascular:     Rate and Rhythm: Normal rate.  Pulmonary:     Effort: Pulmonary effort is normal.  Musculoskeletal:        General: Normal range of motion.  Neurological:     Mental Status: She is alert and oriented to person, place, and time.    Review of Systems  Constitutional: Negative.   HENT: Negative.   Eyes: Negative.    Respiratory: Negative.   Cardiovascular: Negative.   Gastrointestinal: Negative.   Genitourinary: Negative.   Musculoskeletal: Negative.   Skin: Negative.   Neurological: Negative.   Endo/Heme/Allergies: Negative.   Psychiatric/Behavioral: Positive for substance abuse.   Blood pressure (!) 152/87, pulse 94, temperature 98.4 F (36.9 C), temperature source Oral, resp. rate 20, SpO2 96 %. There is no height or weight on file to calculate BMI.  Treatment Plan Summary: Daily contact with patient to assess and evaluate symptoms and progress in treatment and Medication  management  Continue Ativan detox protocol Restart gabapentin 300 mg p.o. 3 times daily, restart Protonix 40 mg p.o. daily Patient will [facility] to [facility] tomorrow morning for residential rehabilitation  Maryfrances Bunnell, FNP 09/02/2020 4:47 PM

## 2020-09-02 NOTE — ED Notes (Signed)
Pt resting on pull out with eyes closed unlabored respirations through night in no acute distress. Safety maintained.  

## 2020-09-02 NOTE — ED Triage Notes (Signed)
Patient arrives via [facility] from [facility] with complaints of SI, depression & ongoing arm pain. Patient reports relapsing on alcohol after being sober for two months which increases her depression and suicidal thoughts. Patient reports plan to cut wrists. Patient reports history of AVH with drinking, of voices and 'the devil', denies current AVH. Patient denies HI. Patient is slow to respond with flat affect. Patient alert & oriented, calm & cooperative, in no acute distress at this time.

## 2020-09-03 ENCOUNTER — Telehealth (HOSPITAL_COMMUNITY): Payer: Self-pay

## 2020-09-03 NOTE — Telephone Encounter (Signed)
Care Management - Follow Up [facility] Discharges    Writer left a HIPPA compliant voice mail message for the patient.  Per NP, patient was referred for outpatient services with [facility].    Writer left name and phone number for the patent to call back if further assistance is needed.

## 2020-09-03 NOTE — ED Notes (Signed)
Patient awake, asking for scheduled medications. Informed that she doesn't have any meds due in the middle of the nigh. Pt did receive PRN.

## 2020-09-03 NOTE — ED Provider Notes (Signed)
FBC/OBS ASAP Discharge Summary  Date and Time: 09/03/2020 5:15 AM  Name: [name]  MRN:  [id number]   Discharge Diagnoses:  Final diagnoses:  MDD (major depressive disorder), recurrent episode, moderate (HCC)  Alcohol abuse     Gallacher a 64 y.o. female hx of alcohol dependence, pancreatitis, transferred from [facility] ED to [facility] due to her presenting here with suicidal ideation because she relapsed from being sober for two weeks. The patient voiced three weeks she fell and broke her left elbow, and her pain is constant. Her doctor took her off her pain medications. The patient expressed it was not helping. The patient was initially seen in the ER for abdominal pain, left arm pain, and suicidal ideation.  The patient states that she is in a "bad" spot right now.  She has been relapsing and drinking alcohol.  She says that she has been drinking daily for the last several weeks.  She has been living in a hotel as well.  She states that she had surgery on the left arm several weeks ago after hurting it. She is no longer on pain medicine anymore.  The patient also has some epigastric and lower abdominal pain as well.  Patient denies any toxic alcohol ingestion or other drug use.  Stay Summary: Patient was admitted for continuous assessment. She was placed on ativan detox protocol. Gabapentin 300 mg TID for alcohol use disorder was restarted. Protonix was restarted for GERD. Patient presents calm, cooperative, and pleasant.  Patient is denying any suicidal homicidal ideations and denies any hallucinations.  Patient is more interested in substance abuse treatment and mainly in residential treatment.  Social work assisted with getting patient placed at [facility] residential and plan is for patient to discharge this morning and arrive there at 7:45 AM.  Patient stated understanding and agreement to plan and continued to deny any suicidal or homicidal ideations.   Total Time spent with patient: 15  minutes  Past Psychiatric History: Alcohol Use Disorder  Past Medical History:  Past Medical History:  Diagnosis Date  . Alcohol dependence (HCC)   . Anxiety   . Chronic pain   . Depression   . Diverticulitis   . Herniated cervical disc   . Pancreatitis   . Seizures (HCC)    alcoholic seizures    Past Surgical History:  Procedure Laterality Date  . ABDOMINAL HYSTERECTOMY    . ANKLE SURGERY Right   . CARPAL TUNNEL RELEASE Bilateral   . CERVICAL FUSION    . CHOLECYSTECTOMY    . KNEE SURGERY     Family History: History reviewed. No pertinent family history.  Social History:  Social History   Substance and Sexual Activity  Alcohol Use Yes   Comment: drinks a fifth of vodka daily     Social History   Substance and Sexual Activity  Drug Use Not Currently  . Types: Marijuana    Social History   Socioeconomic History  . Marital status: Single    Spouse name: Not on file  . Number of children: Not on file  . Years of education: Not on file  . Highest education level: Not on file  Occupational History  . Not on file  Tobacco Use  . Smoking status: Current Every Day Smoker    Packs/day: 0.50    Years: 7.00    Pack years: 3.50    Types: Cigarettes  . Smokeless tobacco: Never Used  Vaping Use  . Vaping Use: Never used  Substance and  Sexual Activity  . Alcohol use: Yes    Comment: drinks a fifth of vodka daily  . Drug use: Not Currently    Types: Marijuana  . Sexual activity: Not Currently  Other Topics Concern  . Not on file  Social History Narrative   Pt lives in a [employer]; lives with boyfriend.   Social Determinants of Health   Financial Resource Strain:   . Difficulty of Paying Living Expenses: Not on file  Food Insecurity:   . Worried About Programme researcher, broadcasting/film/video in the Last Year: Not on file  . Ran Out of Food in the Last Year: Not on file  Transportation Needs:   . Lack of Transportation (Medical): Not on file  . Lack of Transportation  (Non-Medical): Not on file  Physical Activity:   . Days of Exercise per Week: Not on file  . Minutes of Exercise per Session: Not on file  Stress:   . Feeling of Stress : Not on file  Social Connections:   . Frequency of Communication with Friends and Family: Not on file  . Frequency of Social Gatherings with Friends and Family: Not on file  . Attends Religious Services: Not on file  . Active Member of Clubs or Organizations: Not on file  . Attends Banker Meetings: Not on file  . Marital Status: Not on file   SDOH:  SDOH Screenings   Alcohol Screen:   . Last Alcohol Screening Score (AUDIT): Not on file  Depression (PHQ2-9): Medium Risk  . PHQ-2 Score: 27  Financial Resource Strain:   . Difficulty of Paying Living Expenses: Not on file  Food Insecurity:   . Worried About Programme researcher, broadcasting/film/video in the Last Year: Not on file  . Ran Out of Food in the Last Year: Not on file  Housing:   . Last Housing Risk Score: Not on file  Physical Activity:   . Days of Exercise per Week: Not on file  . Minutes of Exercise per Session: Not on file  Social Connections:   . Frequency of Communication with Friends and Family: Not on file  . Frequency of Social Gatherings with Friends and Family: Not on file  . Attends Religious Services: Not on file  . Active Member of Clubs or Organizations: Not on file  . Attends Banker Meetings: Not on file  . Marital Status: Not on file  Stress:   . Feeling of Stress : Not on file  Tobacco Use: High Risk  . Smoking Tobacco Use: Current Every Day Smoker  . Smokeless Tobacco Use: Never Used  Transportation Needs:   . Freight forwarder (Medical): Not on file  . Lack of Transportation (Non-Medical): Not on file    Has this patient used any form of tobacco in the last 30 days? (Cigarettes, Smokeless Tobacco, Cigars, and/or Pipes) A prescription for an FDA-approved tobacco cessation medication was offered at discharge and the  patient refused  Current Medications:  Current Facility-Administered Medications  Medication Dose Route Frequency Provider Last Rate Last Admin  . acetaminophen (TYLENOL) tablet 650 mg  650 mg Oral Q6H PRN Gillermo Murdoch, NP   650 mg at 09/02/20 2311  . alum & mag hydroxide-simeth (MAALOX/MYLANTA) 200-200-20 MG/5ML suspension 30 mL  30 mL Oral Q4H PRN Gillermo Murdoch, NP      . gabapentin (NEURONTIN) capsule 300 mg  300 mg Oral TID Money, [name], FNP   300 mg at 09/02/20 2311  . hydrOXYzine (  ATARAX/VISTARIL) tablet 25 mg  25 mg Oral Q6H PRN Gillermo Murdochhompson, Jacqueline, NP   25 mg at 09/03/20 0334  . loperamide (IMODIUM) capsule 2-4 mg  2-4 mg Oral PRN Gillermo Murdochhompson, Jacqueline, NP      . LORazepam (ATIVAN) tablet 1 mg  1 mg Oral Q6H PRN Gillermo Murdochhompson, Jacqueline, NP      . LORazepam (ATIVAN) tablet 1 mg  1 mg Oral QID Gillermo Murdochhompson, Jacqueline, NP   1 mg at 09/02/20 2311   Followed by  . LORazepam (ATIVAN) tablet 1 mg  1 mg Oral TID Gillermo Murdochhompson, Jacqueline, NP       Followed by  . [START ON 09/04/2020] LORazepam (ATIVAN) tablet 1 mg  1 mg Oral BID Gillermo Murdochhompson, Jacqueline, NP       Followed by  . [START ON 09/06/2020] LORazepam (ATIVAN) tablet 1 mg  1 mg Oral Daily Gillermo Murdochhompson, Jacqueline, NP      . magnesium hydroxide (MILK OF MAGNESIA) suspension 30 mL  30 mL Oral Daily PRN Gillermo Murdochhompson, Jacqueline, NP      . multivitamin with minerals tablet 1 tablet  1 tablet Oral Daily Gillermo Murdochhompson, Jacqueline, NP   1 tablet at 09/02/20 0925  . ondansetron (ZOFRAN-ODT) disintegrating tablet 4 mg  4 mg Oral Q6H PRN Gillermo Murdochhompson, Jacqueline, NP   4 mg at 09/02/20 0538  . pantoprazole (PROTONIX) EC tablet 40 mg  40 mg Oral Daily Money, Gerlene Burdockravis B, FNP   40 mg at 09/02/20 0925  . thiamine tablet 100 mg  100 mg Oral Daily Gillermo Murdochhompson, Jacqueline, NP       Current Outpatient Medications  Medication Sig Dispense Refill  . gabapentin (NEURONTIN) 300 MG capsule Take 1 capsule (300 mg total) by mouth 3 (three) times daily. 90 capsule 0  .  hydrOXYzine (ATARAX/VISTARIL) 25 MG tablet Take 1 tablet (25 mg total) by mouth every 6 (six) hours as needed for anxiety. 30 tablet 0  . pantoprazole (PROTONIX) 40 MG tablet Take 1 tablet (40 mg total) by mouth daily. 30 tablet 0    PTA Medications: (Not in a hospital admission)   Musculoskeletal  Strength & Muscle Tone: within normal limits Gait & Station: normal Patient leans: N/A  Psychiatric Specialty Exam  Presentation  General Appearance: Appropriate for Environment;Casual  Eye Contact:Good  Speech:Blocked;Clear and Coherent  Speech Volume:Normal  Handedness:Right   Mood and Affect  Mood:Anxious  Affect:Appropriate;Congruent   Thought Process  Thought Processes:Coherent;Goal Directed  Descriptions of Associations:Intact  Orientation:Full (Time, Place and Person)  Thought Content:WDL  Hallucinations:Hallucinations: None  Ideas of Reference:None  Suicidal Thoughts:Suicidal Thoughts: No SI Active Intent and/or Plan: With Intent;With Plan;With Means to Carry Out;With Access to Means  Homicidal Thoughts:Homicidal Thoughts: No   Sensorium  Memory:Immediate Good;Recent Good;Remote Good  Judgment:Good  Insight:Good   Executive Functions  Concentration:Good  Attention Span:Good  Recall:Good  Fund of Knowledge:Good  Language:Good   Psychomotor Activity  Psychomotor Activity:Psychomotor Activity: Normal   Assets  Assets:Communication Skills;Desire for Improvement;Financial Resources/Insurance;Social Support   Sleep  Sleep:Sleep: Good   Physical Exam  Physical Exam Constitutional:      General: She is not in acute distress.    Appearance: She is not ill-appearing, toxic-appearing or diaphoretic.  HENT:     Head: Normocephalic.     Right Ear: External ear normal.     Left Ear: External ear normal.  Eyes:     Conjunctiva/sclera: Conjunctivae normal.     Pupils: Pupils are equal, round, and reactive to light.  Cardiovascular:  Rate and Rhythm: Normal rate.  Pulmonary:     Effort: Pulmonary effort is normal. No respiratory distress.  Musculoskeletal:        General: Normal range of motion.  Skin:    General: Skin is warm and dry.  Neurological:     Mental Status: She is alert and oriented to person, place, and time.  Psychiatric:        Mood and Affect: Mood is anxious and depressed.        Thought Content: Thought content is not paranoid or delusional. Thought content does not include homicidal or suicidal ideation.    Review of Systems  Constitutional: Negative for chills, diaphoresis, fever, malaise/fatigue and weight loss.  HENT: Negative for congestion.   Respiratory: Negative for cough and shortness of breath.   Cardiovascular: Negative for chest pain and palpitations.  Gastrointestinal: Negative for diarrhea, nausea and vomiting.  Neurological: Negative for dizziness and seizures.  Psychiatric/Behavioral: Positive for depression and substance abuse. Negative for hallucinations, memory loss and suicidal ideas. The patient is nervous/anxious. The patient does not have insomnia.   All other systems reviewed and are negative.  Blood pressure (!) 152/87, pulse 94, temperature 98.4 F (36.9 C), temperature source Oral, resp. rate 20, SpO2 96 %. There is no height or weight on file to calculate BMI.  Demographic Factors:  Caucasian and Low socioeconomic status  Loss Factors: Decline in physical health  Historical Factors: Family history of mental illness or substance abuse  Risk Reduction Factors:   Sense of responsibility to family, Religious beliefs about death and Living with another person, especially a relative  Continued Clinical Symptoms:  Depression:   Hopelessness  Cognitive Features That Contribute To Risk:  None    Suicide Risk:  Mild:  Suicidal ideation of limited frequency, intensity, duration, and specificity.  There are no identifiable plans, no associated intent, mild dysphoria  and related symptoms, good self-control (both objective and subjective assessment), few other risk factors, and identifiable protective factors, including available and accessible social support.    Disposition: Social work assisted with getting patient placed at [facility] residential and plan is for patient to discharge this morning and arrive there at 7:45 AM.   Jackelyn Poling, NP 09/03/2020, 5:15 AM
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
## 2020-09-04 ENCOUNTER — Emergency Department (HOSPITAL_COMMUNITY): Payer: Medicaid Other

## 2020-09-04 ENCOUNTER — Emergency Department (HOSPITAL_COMMUNITY)
Admission: EM | Admit: 2020-09-04 | Discharge: 2020-09-05 | Disposition: A | Discharge status: Home / Self Care | Payer: Medicaid Other | Attending: Emergency Medicine | Admitting: Emergency Medicine

## 2020-09-04 DIAGNOSIS — M79602 Pain in left arm: Secondary | ICD-10-CM

## 2020-09-04 DIAGNOSIS — F17210 Nicotine dependence, cigarettes, uncomplicated: Secondary | ICD-10-CM | POA: Diagnosis not present

## 2020-09-04 NOTE — Discharge Instructions (Addendum)
You were evaluated in the Emergency Department and after careful evaluation, we did not find any emergent condition requiring admission or further testing in the hospital.  Your exam/testing today was overall reassuring.  Call [phone] for social work assistance.  Please return to the Emergency Department if you experience any worsening of your condition.  Thank you for allowing [place] to be a part of your care.
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
## 2020-10-14 ENCOUNTER — Other Ambulatory Visit: Payer: Self-pay

## 2020-10-14 ENCOUNTER — Encounter (HOSPITAL_COMMUNITY): Payer: Self-pay | Admitting: *Deleted

## 2020-10-14 ENCOUNTER — Emergency Department (HOSPITAL_COMMUNITY)
Admission: EM | Admit: 2020-10-14 | Discharge: 2020-10-15 | Disposition: A | Discharge status: Other Acute Inpatient Hospital | Payer: Medicaid Other | Attending: Emergency Medicine | Admitting: Emergency Medicine

## 2020-10-14 DIAGNOSIS — Y906 Blood alcohol level of 120-199 mg/100 ml: Secondary | ICD-10-CM | POA: Insufficient documentation

## 2020-10-14 DIAGNOSIS — F17210 Nicotine dependence, cigarettes, uncomplicated: Secondary | ICD-10-CM | POA: Insufficient documentation

## 2020-10-14 DIAGNOSIS — Z5900 Homelessness unspecified: Secondary | ICD-10-CM | POA: Insufficient documentation

## 2020-10-14 DIAGNOSIS — Z01818 Encounter for other preprocedural examination: Secondary | ICD-10-CM | POA: Insufficient documentation

## 2020-10-14 DIAGNOSIS — R45851 Suicidal ideations: Secondary | ICD-10-CM | POA: Diagnosis not present

## 2020-10-14 DIAGNOSIS — F1020 Alcohol dependence, uncomplicated: Secondary | ICD-10-CM

## 2020-10-14 DIAGNOSIS — F1010 Alcohol abuse, uncomplicated: Secondary | ICD-10-CM

## 2020-10-14 DIAGNOSIS — Z20822 Contact with and (suspected) exposure to covid-19: Secondary | ICD-10-CM | POA: Diagnosis not present

## 2020-10-14 DIAGNOSIS — F332 Major depressive disorder, 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 psychotic features: Secondary | ICD-10-CM | POA: Diagnosis not present

## 2020-10-14 DIAGNOSIS — F1290 Cannabis use, unspecified, uncomplicated: Secondary | ICD-10-CM | POA: Insufficient documentation

## 2020-10-14 DIAGNOSIS — F10229 Alcohol dependence with intoxication, unspecified: Secondary | ICD-10-CM | POA: Diagnosis present

## 2020-10-14 LAB — RAPID URINE DRUG SCREEN, HOSP PERFORMED
Amphetamines: NOT DETECTED
Barbiturates: NOT DETECTED
Benzodiazepines: NOT DETECTED
Cocaine: NOT DETECTED
Opiates: NOT DETECTED
Tetrahydrocannabinol: NOT DETECTED

## 2020-10-14 LAB — CBC
HCT: 40.3 % (ref 36.0–46.0)
Hemoglobin: 13.4 g/dL (ref 12.0–15.0)
MCH: 30.5 pg (ref 26.0–34.0)
MCHC: 33.3 g/dL (ref 30.0–36.0)
MCV: 91.8 fL (ref 80.0–100.0)
Platelets: 281 10*3/uL (ref 150–400)
RBC: 4.39 MIL/uL (ref 3.87–5.11)
RDW: 13.8 % (ref 11.5–15.5)
WBC: 4.4 10*3/uL (ref 4.0–10.5)
nRBC: 0 % (ref 0.0–0.2)

## 2020-10-14 LAB — COMPREHENSIVE METABOLIC PANEL
ALT: 16 U/L (ref 0–44)
AST: 26 U/L (ref 15–41)
Albumin: 3.8 g/dL (ref 3.5–5.0)
Alkaline Phosphatase: 72 U/L (ref 38–126)
Anion gap: 12 (ref 5–15)
BUN: 7 mg/dL — ABNORMAL LOW (ref 8–23)
CO2: 24 mmol/L (ref 22–32)
Calcium: 8.4 mg/dL — ABNORMAL LOW (ref 8.9–10.3)
Chloride: 108 mmol/L (ref 98–111)
Creatinine, Ser: 0.59 mg/dL (ref 0.44–1.00)
GFR, Estimated: >60 mL/min (ref >60)
Glucose, Bld: 85 mg/dL (ref 70–99)
Potassium: 3.3 mmol/L — ABNORMAL LOW (ref 3.5–5.1)
Sodium: 144 mmol/L (ref 135–145)
Total Bilirubin: 0.2 mg/dL — ABNORMAL LOW (ref 0.3–1.2)
Total Protein: 6.7 g/dL (ref 6.5–8.1)

## 2020-10-14 LAB — ETHANOL: Alcohol, Ethyl (B): 296 mg/dL — ABNORMAL HIGH (ref <10)

## 2020-10-14 LAB — RESP PANEL BY RT-PCR (FLU A&B, COVID) ARPGX2
Influenza A by PCR: NEGATIVE
Influenza B by PCR: NEGATIVE
SARS Coronavirus 2 by RT PCR: NEGATIVE

## 2020-10-14 LAB — ACETAMINOPHEN LEVEL: Acetaminophen (Tylenol), Serum: <10 ug/mL — ABNORMAL LOW (ref 10–30)

## 2020-10-14 LAB — SALICYLATE LEVEL: Salicylate Lvl: <7 mg/dL — ABNORMAL LOW (ref 7.0–30.0)

## 2020-10-14 MED ORDER — POTASSIUM CHLORIDE CRYS ER 20 MEQ PO TBCR
40.0000 meq | EXTENDED_RELEASE_TABLET | Freq: Once | ORAL | Status: AC
  Administered 2020-10-15: 03:00:00 40 meq via ORAL
  Filled 2020-10-14: qty 2

## 2020-10-14 MED ORDER — ACETAMINOPHEN 325 MG PO TABS: 650.0000 mg | ORAL_TABLET | Freq: Once | ORAL | Status: DC

## 2020-10-14 NOTE — ED Provider Notes (Signed)
[facility]-EMERGENCY DEPT Provider Note   CSN: [id number] Arrival date & time: 10/14/20  1742     History Chief Complaint  Patient presents with  . Medical Clearance    [name] is a 64 y.o. female.  The history is provided by the patient and medical records. No language interpreter was used.  Mental Health Problem Presenting symptoms: depression and suicidal thoughts   Presenting symptoms: no agitation, no hallucinations, no homicidal ideas, no suicidal threats and no suicide attempt   Degree of incapacity (severity):  Severe Onset quality:  Gradual Timing:  Constant Progression:  Worsening Chronicity:  New Context: alcohol use   Treatment compliance:  Untreated Relieved by:  Nothing Worsened by:  Alcohol Ineffective treatments:  None tried Associated symptoms: no abdominal pain, no appetite change, no chest pain, no fatigue, no feelings of worthlessness and no headaches   Risk factors: hx of mental illness        Past Medical History:  Diagnosis Date  . Alcohol dependence (HCC)   . Anxiety   . Chronic pain   . Depression   . Diverticulitis   . Herniated cervical disc   . Pancreatitis   . Seizures (HCC)    alcoholic seizures    Patient Active Problem List   Diagnosis Date Noted  . Closed displaced oblique fracture of shaft of left humerus 08/01/2020  . Diverticulitis 02/29/2020  . Acute diverticulitis 02/28/2020  . GERD (gastroesophageal reflux disease) 02/28/2020  . Suicidal ideation   . Delirium tremens (HCC) 12/13/2018  . Thrombocytopenia (HCC) 12/13/2018  . Homeless 12/13/2018  . Depression 12/13/2018  . Neuropathy 12/13/2018  . Subarachnoid hemorrhage (HCC) 12/11/2018  . Seizure due to alcohol withdrawal (HCC) 12/11/2018  . Alcohol dependence with alcohol-induced mood disorder (HCC)   . 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 Major depressive disorder, recurrent severe  without psychotic features (HCC) 10/12/2018    Past Surgical History:  Procedure Laterality Date  . ABDOMINAL HYSTERECTOMY    . ANKLE SURGERY Right   . CARPAL TUNNEL RELEASE Bilateral   . CERVICAL FUSION    . CHOLECYSTECTOMY    . KNEE SURGERY       OB History   No obstetric history on file.     No family history on file.  Social History   Tobacco Use  . Smoking status: Current Every Day Smoker    Packs/day: 0.50    Years: 7.00    Pack years: 3.50    Types: Cigarettes  . Smokeless tobacco: Never Used  Vaping Use  . Vaping Use: Never used  Substance Use Topics  . Alcohol use: Yes    Comment: drinks a fifth of vodka daily  . Drug use: Not Currently    Types: Marijuana    Home Medications Prior to Admission medications   Medication Sig Start Date End Date Taking? Authorizing Provider  gabapentin (NEURONTIN) 300 MG capsule Take 1 capsule (300 mg total) by mouth 3 (three) times daily. 09/02/20   Money, [name], FNP  hydrOXYzine (ATARAX/VISTARIL) 25 MG tablet Take 1 tablet (25 mg total) by mouth every 6 (six) hours as needed for anxiety. 09/02/20   Money, [name], FNP  pantoprazole (PROTONIX) 40 MG tablet Take 1 tablet (40 mg total) by mouth daily. 09/02/20   Money, [name], FNP  DULoxetine (CYMBALTA) 30 MG capsule Take 1 capsule (30 mg total) by mouth daily. Patient not taking: Reported on 09/01/2020 06/11/20 09/02/20  Money, [name], FNP  metoCLOPramide (REGLAN) 10 MG tablet Take 1 tablet (10 mg total) by mouth every 6 (six) hours as needed for nausea (nausea/headache). 04/06/19 05/24/19  Mesner, Barbara Cower, MD    Allergies    Cephalexin, Cephalosporins, Ketorolac, Prednisone, and Tramadol  Review of Systems   Review of Systems  Constitutional: Negative for appetite change, chills, fatigue and fever.  HENT: Negative for congestion.   Eyes: Negative for visual disturbance.  Respiratory: Negative for cough, chest tightness, shortness of breath and wheezing.    Cardiovascular: Negative for chest pain and palpitations.  Gastrointestinal: Negative for abdominal pain, constipation, diarrhea, nausea and vomiting.  Genitourinary: Negative for dysuria, flank pain and frequency.  Musculoskeletal: Negative for back pain, neck pain and neck stiffness.  Skin: Negative for rash and wound.  Neurological: Negative for dizziness, light-headedness and headaches.  Psychiatric/Behavioral: Positive for suicidal ideas. Negative for agitation, confusion, hallucinations and homicidal ideas.  All other systems reviewed and are negative.   Physical Exam Updated Vital Signs BP 108/61 (BP Location: Right Arm)   Pulse 69   Temp 98.1 F (36.7 C) (Oral)   Resp 16   SpO2 96%   Physical Exam Vitals and nursing note reviewed.  Constitutional:      General: She is not in acute distress.    Appearance: She is well-developed and well-nourished. She is not ill-appearing, toxic-appearing or diaphoretic.  HENT:     Head: Normocephalic and atraumatic.     Right Ear: External ear normal.     Left Ear: External ear normal.     Nose: Nose normal. No congestion or rhinorrhea.     Mouth/Throat:     Mouth: Oropharynx is clear and moist. Mucous membranes are moist.     Pharynx: No oropharyngeal exudate or posterior oropharyngeal erythema.  Eyes:     Extraocular Movements: Extraocular movements intact and EOM normal.     Conjunctiva/sclera: Conjunctivae normal.     Pupils: Pupils are equal, round, and reactive to light.  Cardiovascular:     Rate and Rhythm: Normal rate.     Pulses: Normal pulses.     Heart sounds: No murmur heard.   Pulmonary:     Effort: Pulmonary effort is normal. No respiratory distress.     Breath sounds: No stridor. No wheezing, rhonchi or rales.  Chest:     Chest wall: No tenderness.  Abdominal:     General: Abdomen is flat. There is no distension.     Tenderness: There is no abdominal tenderness. There is no right CVA tenderness, left CVA  tenderness, guarding or rebound.  Musculoskeletal:        General: Tenderness present.     Cervical back: Normal range of motion and neck supple.     Right lower leg: No edema.     Left lower leg: No edema.  Skin:    General: Skin is warm.     Coloration: Skin is not pale.     Findings: No erythema or rash.  Neurological:     General: No focal deficit present.     Mental Status: She is alert and oriented to person, place, and time.     Sensory: No sensory deficit.     Motor: No weakness or abnormal muscle tone.     Deep Tendon Reflexes: Reflexes are normal and symmetric.  Psychiatric:        Mood and Affect: Mood is depressed.        Thought Content: Thought content is  not paranoid. Thought content includes suicidal ideation. Thought content does not include homicidal ideation. Thought content does not include homicidal or suicidal plan.     ED Results / Procedures / Treatments   Labs (all labs ordered are listed, but only abnormal results are displayed) Labs Reviewed  COMPREHENSIVE METABOLIC PANEL - Abnormal; Notable for the following components:      Result Value   Potassium 3.3 (*)    BUN 7 (*)    Calcium 8.4 (*)    Total Bilirubin 0.2 (*)    All other components within normal limits  ETHANOL - Abnormal; Notable for the following components:   Alcohol, Ethyl (B) 296 (*)    All other components within normal limits  SALICYLATE LEVEL - Abnormal; Notable for the following components:   Salicylate Lvl <7.0 (*)    All other components within normal limits  ACETAMINOPHEN LEVEL - Abnormal; Notable for the following components:   Acetaminophen (Tylenol), Serum <10 (*)    All other components within normal limits  RESP PANEL BY RT-PCR (FLU A&B, COVID) ARPGX2  CBC  RAPID URINE DRUG SCREEN, HOSP PERFORMED    EKG None  Radiology No results found.  Procedures Procedures (including critical care time)  Medications Ordered in ED Medications  acetaminophen (TYLENOL)  tablet 650 mg (has no administration in time range)  potassium chloride SA (KLOR-CON) CR tablet 40 mEq (has no administration in time range)    ED Course  I have reviewed the triage vital signs and the nursing notes.  Pertinent labs & imaging results that were available during my care of the patient were reviewed by me and considered in my medical decision making (see chart for details).    MDM Rules/Calculators/A&P                         Vilinda FlakeDarlene Cornick is a 64 y.o. female with a past medical history significant for alcohol abuse, depression, GERD, diverticulosis, chronic pain from previous traumatic injuries, and seizures who presents with suicidal ideation and alcohol use.  Patient reports that she is having suicidal thoughts but does not have a plan how she would kill herself.  She reports that she "does not want to live".  She reports that during the holidays, she had worsened thoughts of suicide now that everyone in her family is dead.  She reports no homicidal ideation.  She does report auditory hallucinations hearing family and people.  She is very concerned.  She reports he been trying to drink alcohol to "numb the pain" from both the depression as well as her chronic pain in her left elbow and arm.  She says that she had trauma and has a plate in screws in her arm that always hurts her.  She reports does not go to pain clinic and does not see anybody for pain management.  She reports she has been "drinking a lot" to get to feeling better.  She denies any headache, neck pain, or torso pains.  Denies nausea, vomiting, constipation, diarrhea, or urinary symptoms.  No Covid exposures or symptoms she reports.  On exam, patient has some tenderness in her left elbow which she reports is chronic.  She also has pain with left elbow movement.  She denies any abdominal tenderness or chest tenderness on exam.  Lungs are clear.  Patient has symmetric grip strength and sensation in extremities.  Good  pulses lower extremities.  Patient is alert and oriented.   Clinical  aspect patient is intoxicated given her report of alcohol today and her appearance.  She reports no new injuries and reports that "my pain is chronic".  With this, we will get screening labs for her medical clearance and then when they return, we will give her either Tylenol or Motrin to help with the pain.  Anticipate TTS consultation for suicidal ideation with depression and alcohol abuse when work-up is completed.  10:43 PM Patient's laboratory testing shows mild hypokalemia.  Oral potassium pill mentation ordered.  Alcohol is elevated at 296.  Suspect this is contributing to her symptoms.  Flu and Covid negative.   Now that her labs have completed.  When she sobers up, she will be medically clear for TTS evaluation.    Final Clinical Impression(s) / ED Diagnoses Final diagnoses:  Alcohol abuse  Suicidal ideation     Clinical Impression: 1. Alcohol abuse   2. Suicidal ideation     Disposition: Awaiting TTS evaluation for alcohol intoxication and suicidal ideation recurrence.  This note was prepared with assistance of Conservation officer, historic buildings. Occasional wrong-word or sound-a-like substitutions may have occurred due to the inherent limitations of voice recognition software.     Rayburn Mundis, Canary Brim, MD 10/14/20 [phone]

## 2020-10-14 NOTE — ED Triage Notes (Signed)
Pt BIB EMS and presents with ETOH intoxication and vague notions of SI. Hx shizophrenia and bipolar and has been non compliant with medications. Pt stated to EMS that her plan was to "OD on something" but didn't know what she would use or how to obtain. Pt ambulatory upon arrival to ED>

## 2020-10-15 ENCOUNTER — Encounter (HOSPITAL_COMMUNITY): Payer: Self-pay

## 2020-10-15 ENCOUNTER — Ambulatory Visit (HOSPITAL_COMMUNITY)
Admission: EM | Admit: 2020-10-15 | Discharge: 2020-10-15 | Disposition: A | Discharge status: Home / Self Care | Payer: Medicaid Other | Source: Home / Self Care

## 2020-10-15 DIAGNOSIS — F1020 Alcohol dependence, uncomplicated: Secondary | ICD-10-CM

## 2020-10-15 DIAGNOSIS — F332 Major depressive disorder, 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 psychotic features: Secondary | ICD-10-CM

## 2020-10-15 DIAGNOSIS — F1094 Alcohol use, unspecified with alcohol-induced mood disorder: Secondary | ICD-10-CM | POA: Insufficient documentation

## 2020-10-15 MED ORDER — HYDROXYZINE HCL 25 MG PO TABS: 25.0000 mg | ORAL_TABLET | Freq: Four times a day (QID) | ORAL | 0 refills | Status: DC | PRN

## 2020-10-15 MED ORDER — ONDANSETRON 4 MG PO TBDP
4.0000 mg | ORAL_TABLET | Freq: Four times a day (QID) | ORAL | Status: DC | PRN
  Filled 2020-10-15: qty 1

## 2020-10-15 MED ORDER — LORAZEPAM 1 MG PO TABS
1.0000 mg | ORAL_TABLET | Freq: Four times a day (QID) | ORAL | Status: DC | PRN
  Administered 2020-10-15: 08:00:00 1 mg via ORAL
  Filled 2020-10-15: qty 1

## 2020-10-15 MED ORDER — GABAPENTIN 300 MG PO CAPS
600.0000 mg | ORAL_CAPSULE | Freq: Three times a day (TID) | ORAL | Status: DC
  Administered 2020-10-15 (×2): 600 mg via ORAL
  Filled 2020-10-15: qty 42
  Filled 2020-10-15 (×2): qty 2

## 2020-10-15 MED ORDER — HYDROXYZINE HCL 25 MG PO TABS
25.0000 mg | ORAL_TABLET | Freq: Four times a day (QID) | ORAL | Status: DC | PRN
  Filled 2020-10-15: qty 10

## 2020-10-15 MED ORDER — PANTOPRAZOLE SODIUM 40 MG PO TBEC: 40.0000 mg | DELAYED_RELEASE_TABLET | Freq: Every day | ORAL | 0 refills | Status: DC

## 2020-10-15 MED ORDER — LOPERAMIDE HCL 2 MG PO CAPS
2.0000 mg | ORAL_CAPSULE | ORAL | Status: DC | PRN
  Administered 2020-10-15: 08:00:00 4 mg via ORAL
  Filled 2020-10-15: qty 2

## 2020-10-15 MED ORDER — THIAMINE HCL 100 MG PO TABS: 100.0000 mg | ORAL_TABLET | Freq: Every day | ORAL | Status: DC

## 2020-10-15 MED ORDER — MAGNESIUM HYDROXIDE 400 MG/5ML PO SUSP: 30.0000 mL | Freq: Every day | ORAL | Status: DC | PRN

## 2020-10-15 MED ORDER — ACETAMINOPHEN 325 MG PO TABS
650.0000 mg | ORAL_TABLET | Freq: Four times a day (QID) | ORAL | Status: DC | PRN
  Administered 2020-10-15: 650 mg via ORAL
  Filled 2020-10-15: qty 2

## 2020-10-15 MED ORDER — PANTOPRAZOLE SODIUM 40 MG PO TBEC
40.0000 mg | DELAYED_RELEASE_TABLET | Freq: Every day | ORAL | Status: DC
  Administered 2020-10-15: 10:00:00 40 mg via ORAL
  Filled 2020-10-15: qty 7
  Filled 2020-10-15: qty 1

## 2020-10-15 MED ORDER — GABAPENTIN 300 MG PO CAPS: 600.0000 mg | ORAL_CAPSULE | Freq: Three times a day (TID) | ORAL | Status: DC

## 2020-10-15 MED ORDER — ALUM & MAG HYDROXIDE-SIMETH 200-200-20 MG/5ML PO SUSP: 30.0000 mL | ORAL | Status: DC | PRN

## 2020-10-15 MED ORDER — GABAPENTIN 300 MG PO CAPS: 600.0000 mg | ORAL_CAPSULE | Freq: Three times a day (TID) | ORAL | 0 refills | Status: DC
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
## 2020-10-15 NOTE — ED Notes (Signed)
Patient arrives as direct admit from [facility] for alcohol intoxication and passive suicidal thoughts with idea to OD but doen't know how. Patient reports very very depressed, recently left [facility] because 'they just want my money'. Reports all family dead so she had 'no Christmas'. Patient complains of chronic arm pain as well as homelessness. States her children don't talk to her. Patient alert and oriented, in no acute distress at this time.

## 2020-10-15 NOTE — ED Provider Notes (Signed)
FBC/OBS ASAP Discharge Summary  Date and Time: 10/15/2020 11:03 AM  Name: [name]  MRN:  [id number]   Discharge Diagnoses:  Final diagnoses:  MDD (major depressive disorder), recurrent severe, without psychosis (HCC)  Alcohol use disorder, severe, dependence (HCC)    Subjective: Patient reports today that she is doing well.  She denies any suicidal homicidal ideations and denies any hallucinations.  Patient reports that she needs to go to detox for substance abuse due to her excessive alcohol use.  Patient did state that she is not wanting to go back to [facility].  She states that she relapsed on alcohol approximately 8 days ago and that because of her relapse this is made her feel suicidal at times.  However she reports that she is not suicidal today and only wants to go to substance abuse treatment.  She was informed that [facility] [place] because they do not have any available beds.  She was also declined at [facility] due to having Medicaid.  Patient was accepted at [facility] in [place] and can arrive at 5 PM today.  Patient was provided with 7-day samples of her medications and 30-day prescriptions. Patient is changed her mind and decided that she did not want to go to [facility] and stated that she wanted discharged home to be with her husband.  She states they live in a tent behind [place] and that she cannot leave him behind without him knowing where she is.  She is informed that she can contact [facility] if she changes her mind about going.  Stay Summary: Patient is a 64 year old female with a known history of MDD and alcohol use disorder that presented to [facility] reporting suicidal ideations and then was transferred to the [facility] for cutaneous assessment.  Patient presented reporting that she relapsed on alcohol approximately 8 days ago and that she needs to get clean and sober again.  Patient stated that due to her substance use she has started feeling more  depressed and having suicidal thoughts of overdosing.  Patient reported that she was at [facility] house approximately 3 weeks and remained sober and has limited support in the [place] area.  Patient was admitted to the continuous observation unit and restarted on home medications.  This morning the patient denies any suicidal or homicidal ideations and denies any hallucinations.  Patient reports that she will substance abuse treatment.  Patient was established with [facility] in [place] but then later refused and wanted to be discharged back to the street with her husband.  Patient was still provided with 7-day samples of her medications as well as 30-day prescriptions and informed of open access hours as well as provide her with additional resources for [facility] if she later agrees to go in for treatment.  Total Time spent with patient: 30 minutes  Past Psychiatric History: Alcohol dependence, anxiety, MDD, previous hospitalizations Past Medical History:  Past Medical History:  Diagnosis Date  . Alcohol dependence (HCC)   . Anxiety   . Chronic pain   . Depression   . Diverticulitis   . Herniated cervical disc   . Pancreatitis   . Seizures (HCC)    alcoholic seizures    Past Surgical History:  Procedure Laterality Date  . ABDOMINAL HYSTERECTOMY    . ANKLE SURGERY Right   . CARPAL TUNNEL RELEASE Bilateral   . CERVICAL FUSION    . CHOLECYSTECTOMY    . KNEE SURGERY  Family History: History reviewed. No pertinent family history. Family Psychiatric History: None reported Social History:  Social History   Substance and Sexual Activity  Alcohol Use Yes   Comment: drinks a fifth of vodka daily     Social History   Substance and Sexual Activity  Drug Use Not Currently  . Types: Marijuana    Social History   Socioeconomic History  . Marital status: Single    Spouse name: Not on file  . Number of children: Not on file  . Years of education: Not on file  . Highest education level: Not  on file  Occupational History  . Not on file  Tobacco Use  . Smoking status: Current Every Day Smoker    Packs/day: 0.50    Years: 7.00    Pack years: 3.50    Types: Cigarettes  . Smokeless tobacco: Never Used  Vaping Use  . Vaping Use: Never used  Substance and Sexual Activity  . Alcohol use: Yes    Comment: drinks a fifth of vodka daily  . Drug use: Not Currently    Types: Marijuana  . Sexual activity: Not Currently  Other Topics Concern  . Not on file  Social History Narrative   Pt lives in a [address] [employer]; lives with boyfriend.   Social Determinants of Health   Financial Resource Strain: Not on file  Food Insecurity: Not on file  Transportation Needs: Not on file  Physical Activity: Not on file  Stress: Not on file  Social Connections: Not on file   SDOH:  SDOH Screenings   Alcohol Screen: Not on file  Depression (PHQ2-9): Medium Risk  . PHQ-2 Score: 23  Financial Resource Strain: Not on file  Food Insecurity: Not on file  Housing: Not on file  Physical Activity: Not on file  Social Connections: Not on file  Stress: Not on file  Tobacco Use: High Risk  . Smoking Tobacco Use: Current Every Day Smoker  . Smokeless Tobacco Use: Never Used  Transportation Needs: Not on file    Has this patient used any form of tobacco in the last 30 days? (Cigarettes, Smokeless Tobacco, Cigars, and/or Pipes) A prescription for an FDA-approved tobacco cessation medication was offered at discharge and the patient refused  Current Medications:  Current Facility-Administered Medications  Medication Dose Route Frequency Provider Last Rate Last Admin  . acetaminophen (TYLENOL) tablet 650 mg  650 mg Oral Q6H PRN Nira ConnBerry, Jason A, NP   650 mg at 10/15/20 0815  . alum & mag hydroxide-simeth (MAALOX/MYLANTA) 200-200-20 MG/5ML suspension 30 mL  30 mL Oral Q4H PRN Nira ConnBerry, Jason A, NP      . gabapentin (NEURONTIN) capsule 600 mg  600 mg Oral TID Nira ConnBerry, Jason A, NP   600 mg at 10/15/20 0954  .  hydrOXYzine (ATARAX/VISTARIL) tablet 25 mg  25 mg Oral Q6H PRN Nira ConnBerry, Jason A, NP      . loperamide (IMODIUM) capsule 2-4 mg  2-4 mg Oral PRN Nira ConnBerry, Jason A, NP   4 mg at 10/15/20 0815  . LORazepam (ATIVAN) tablet 1 mg  1 mg Oral Q6H PRN Nira ConnBerry, Jason A, NP   1 mg at 10/15/20 0815  . magnesium hydroxide (MILK OF MAGNESIA) suspension 30 mL  30 mL Oral Daily PRN Nira ConnBerry, Jason A, NP      . ondansetron (ZOFRAN-ODT) disintegrating tablet 4 mg  4 mg Oral Q6H PRN Nira ConnBerry, Jason A, NP      . pantoprazole (PROTONIX) EC tablet 40 mg  40 mg Oral Daily Nira Conn A, NP   40 mg at 10/15/20 0954  . [START ON 10/16/2020] thiamine tablet 100 mg  100 mg Oral Daily Jackelyn Poling, NP       Current Outpatient Medications  Medication Sig Dispense Refill  . gabapentin (NEURONTIN) 300 MG capsule Take 2 capsules (600 mg total) by mouth 3 (three) times daily. 180 capsule 0  . hydrOXYzine (ATARAX/VISTARIL) 25 MG tablet Take 1 tablet (25 mg total) by mouth every 6 (six) hours as needed for anxiety. 30 tablet 0  . pantoprazole (PROTONIX) 40 MG tablet Take 1 tablet (40 mg total) by mouth daily. 30 tablet 0    PTA Medications: (Not in a hospital admission)   Musculoskeletal  Strength & Muscle Tone: within normal limits Gait & Station: normal Patient leans: N/A  Psychiatric Specialty Exam  Presentation  General Appearance: Appropriate for Environment; Disheveled  Eye Contact:Good  Speech:Clear and Coherent; Normal Rate  Speech Volume:Decreased  Handedness:Right   Mood and Affect  Mood:Anxious  Affect:Appropriate; Congruent   Thought Process  Thought Processes:Coherent  Descriptions of Associations:Intact  Orientation:Full (Time, Place and Person)  Thought Content:WDL  Hallucinations:Hallucinations: None  Ideas of Reference:None  Suicidal Thoughts:Suicidal Thoughts: No SI Active Intent and/or Plan: Without Intent; With Plan  Homicidal Thoughts:Homicidal Thoughts: No   Sensorium   Memory:Immediate Good; Recent Good; Remote Good  Judgment:Intact  Insight:Fair   Executive Functions  Concentration:Good  Attention Span:Good  Recall:Good  Fund of Knowledge:Good  Language:Good   Psychomotor Activity  Psychomotor Activity:Psychomotor Activity: Normal   Assets  Assets:Communication Skills; Desire for Improvement; Financial Resources/Insurance; Transportation; Social Support   Sleep  Sleep:Sleep: Good   Physical Exam  Physical Exam Vitals and nursing note reviewed.  Constitutional:      Appearance: She is well-developed.  HENT:     Head: Normocephalic.  Eyes:     Pupils: Pupils are equal, round, and reactive to light.  Cardiovascular:     Rate and Rhythm: Normal rate.  Pulmonary:     Effort: Pulmonary effort is normal.  Musculoskeletal:        General: Normal range of motion.  Neurological:     Mental Status: She is alert and oriented to person, place, and time.    Review of Systems  Constitutional: Negative.   HENT: Negative.   Eyes: Negative.   Respiratory: Negative.   Cardiovascular: Negative.   Gastrointestinal: Negative.   Genitourinary: Negative.   Musculoskeletal: Negative.   Skin: Negative.   Neurological: Negative.   Endo/Heme/Allergies: Negative.   Psychiatric/Behavioral: Positive for substance abuse.   Blood pressure (!) 160/92, pulse 89, temperature 97.8 F (36.6 C), temperature source Temporal, resp. rate 16, SpO2 98 %. There is no height or weight on file to calculate BMI.   Demographic Factors:  Age 30 or older, Caucasian and Low socioeconomic status  Loss Factors: NA  Historical Factors: NA  Risk Reduction Factors:   Sense of responsibility to family, Living with another person, especially a relative, Positive social support and Positive therapeutic relationship  Continued Clinical Symptoms:  Alcohol/Substance Abuse/Dependencies Previous Psychiatric Diagnoses and Treatments  Cognitive Features That  Contribute To Risk:  None    Suicide Risk:  Minimal: No identifiable suicidal ideation.  Patients presenting with no risk factors but with morbid ruminations; may be classified as minimal risk based on the severity of the depressive symptoms  Plan Of Care/Follow-up recommendations:  Continue activity as tolerated. Continue diet as recommended by your PCP. Ensure to keep  all appointments with outpatient providers.  Disposition: Discharge home  Maryfrances Bunnell, FNP 10/15/2020, 11:03 AM

## 2020-10-15 NOTE — Discharge Instructions (Addendum)

## 2020-10-15 NOTE — ED Notes (Signed)
Pt resting in no acute distress. RR even and unlabored. Safety maintained. 

## 2020-10-15 NOTE — ED Notes (Signed)
Pt resting on pull out with eyes closed unlabored respirations through night in no acute distress. Safety maintained. Delaying CIWA to day shift as to allow patient to obtain more sleep.

## 2020-10-15 NOTE — ED Notes (Signed)
Patient compliant with skin assessment, triage. Shown to obs area, complaining of nausea. Ginger ale and crackers provided.

## 2020-10-15 NOTE — ED Notes (Signed)
Patient A&O x 4, ambulatory. Patient discharged in no acute distress. Patient denied SI/HI, A/VH upon discharge. Patient verbalized understanding of all discharge instructions explained by staff, to include follow up care, RX's and safety plan. Pt belongings returned to patient from locker intact. Patient escorted to lobby via staff for transport to destination. Safety maintained.

## 2020-10-15 NOTE — ED Notes (Signed)
Called report to [place], Charity fundraiser at [facility]

## 2020-10-15 NOTE — ED Notes (Signed)
[phone] prior to transport. Nira Conn, NP is accepting provider

## 2020-10-15 NOTE — ED Provider Notes (Signed)
Behavioral Health Admission H&P [facility] OBS)  Date: 10/15/20 Patient Name: [name] MRN: [id number] Chief Complaint:  Chief Complaint  Patient presents with  . Alcohol Intoxication  . Suicidal      Diagnoses:  Final diagnoses:  MDD (major depressive disorder), recurrent severe, without psychosis (HCC)  Alcohol use disorder, severe, dependence (HCC)    HPI: [name] is a 64 y.o. female with a history of MDD and alcohol use disorder who presented to [facility] due to SI. She was transferred to [facility] for continuous assessment. On evaluation patient is alert and oriented x 4, pleasant, and cooperative. Speech is clear and coherent. Mood is depressed and affect is congruent with mood. Thought process is coherent and thought content is logical. Denies auditory and visual hallucinations. No indication that patient is responding to internal stimuli. No evidence of delusional thought content. Continues to reports SI with thoughts of overdosing. However, she states that she was raised christian and does not want to kill herself.  Denies homicidal ideations. Reports daily use of alcohol; however, she does not quantify how much. Denies use of other substances. She states that she was sober for approximately 3 weeks while at [place] house. She states that she started using alcohol again 8 days ago and was asked to leave the house. She states that her parents and siblings are all deceased and her children do not speak to her. She states that she has no social support and this has triggered her depression. She states that he finance of about 15 years also has an alcohol related problem and that their relationship has continued to deteriorate. She states that they are both homeless. She would like assistance with substance abuse treatment and finding housing. States that she does not want to return to the [employer].    PHQ 2-9:  Flowsheet Row ED from 10/14/2020 in [facility]OSP[facility]-EMERGENCY DEPT ED from 06/08/2020 in [facility]  Thoughts that you would be better off dead, or of hurting yourself in some way Several days Nearly every day  PHQ-9 Total Score 23 27      Flowsheet Row ED from 10/14/2020 in [facility] ED [facility]2020 [facility] ([facility]  C-SSRS RISK CATEGORY High Risk High Risk High Risk       Total Time spent with patient: 20 minutes  Musculoskeletal  Strength & Muscle Tone: within normal limits Gait & Station: normal Patient leans: N/A  Psychiatric Specialty Exam  Presentation General Appearance: Fairly Groomed  Eye Contact:Good  Speech:Clear and Coherent  Speech Volume:Normal  Handedness:Right   Mood and Affect  Mood:Anxious; Depressed  Affect:Depressed; Congruent   Thought Process  Thought Processes:Coherent  Descriptions of Associations:Intact  Orientation:Full (Time, Place and Person)  Thought Content:WDL  Hallucinations:Hallucinations: None  Ideas of Reference:None  Suicidal Thoughts:Suicidal Thoughts: Yes, Active SI Active Intent and/or Plan: Without Intent; With Plan  Homicidal Thoughts:Homicidal Thoughts: No   Sensorium  Memory:Immediate Good; Recent Good; Remote Good  Judgment:Intact  Insight:Fair   Executive Functions  Concentration:Good  Attention Span:Good  Recall:Good  Fund of Knowledge:Good  Language:Good   Psychomotor Activity  Psychomotor Activity:Psychomotor Activity: Normal   Assets  Assets:Communication Skills; Desire for Improvement   Sleep  Sleep:Sleep: Fair   Physical Exam Constitutional:      General: She is not in acute distress.    Appearance: She is not ill-appearing,  toxic-appearing or diaphoretic.  HENT:     Head: Normocephalic.     Right Ear: External ear normal.     Left  Ear: External ear normal.  Eyes:     Conjunctiva/sclera: Conjunctivae normal.     Pupils: Pupils are equal, round, and reactive to light.  Cardiovascular:     Rate and Rhythm: Normal rate.  Pulmonary:     Effort: Pulmonary effort is normal. No respiratory distress.  Musculoskeletal:        General: Normal range of motion.  Skin:    General: Skin is warm and dry.  Neurological:     Mental Status: She is alert and oriented to person, place, and time.  Psychiatric:        Mood and Affect: Mood is anxious and depressed.        Thought Content: Thought content is not paranoid or delusional. Thought content includes suicidal ideation. Thought content does not include homicidal ideation. Thought content includes suicidal plan.    Review of Systems  Constitutional: Negative for chills, diaphoresis, fever, malaise/fatigue and weight loss.  HENT: Negative for congestion.   Respiratory: Negative for cough and shortness of breath.   Cardiovascular: Negative for chest pain and palpitations.  Gastrointestinal: Negative for diarrhea, nausea and vomiting.  Musculoskeletal: Positive for joint pain.  Neurological: Negative for dizziness and seizures.  Psychiatric/Behavioral: Positive for depression, substance abuse and suicidal ideas. Negative for hallucinations and memory loss. The patient is nervous/anxious and has insomnia.   All other systems reviewed and are negative.   Blood pressure 119/89, pulse 72, temperature 97.8 F (36.6 C), temperature source Temporal, resp. rate 18, SpO2 98 %. There is no height or weight on file to calculate BMI.  Past Psychiatric History: MDD, alcohol use disorder  Is the patient at risk to self? Yes  Has the patient been a risk to self in the past 6 months? Yes .    Has the patient been a risk to self within the distant past? Yes   Is the patient a risk to others? No   Has the patient been a risk to others in the past 6 months? No   Has the patient been a risk  to others within the distant past? No   Past Medical History:  Past Medical History:  Diagnosis Date  . Alcohol dependence (HCC)   . Anxiety   . Chronic pain   . Depression   . Diverticulitis   . Herniated cervical disc   . Pancreatitis   . Seizures (HCC)    alcoholic seizures    Past Surgical History:  Procedure Laterality Date  . ABDOMINAL HYSTERECTOMY    . ANKLE SURGERY Right   . CARPAL TUNNEL RELEASE Bilateral   . CERVICAL FUSION    . CHOLECYSTECTOMY    . KNEE SURGERY      Family History: History reviewed. No pertinent family history.  Social History:  Social History   Socioeconomic History  . Marital status: Single    Spouse name: Not on file  . Number of children: Not on file  . Years of education: Not on file  . Highest education level: Not on file  Occupational History  . Not on file  Tobacco Use  . Smoking status: Current Every Day Smoker    Packs/day: 0.50    Years: 7.00    Pack years: 3.50    Types: Cigarettes  . Smokeless tobacco: Never Used  Vaping Use  . Vaping Use: Never used  Substance and Sexual Activity  . Alcohol use: Yes    Comment: drinks a fifth of vodka daily  . Drug use: Not Currently    Types: Marijuana  . Sexual activity: Not Currently  Other Topics Concern  . Not on file  Social History Narrative   Pt lives in a [employer]; lives with boyfriend.   Social Determinants of Health   Financial Resource Strain: Not on file  Food Insecurity: Not on file  Transportation Needs: Not on file  Physical Activity: Not on file  Stress: Not on file  Social Connections: Not on file  Intimate Partner Violence: Not on file    SDOH:  SDOH Screenings   Alcohol Screen: Not on file  Depression (PHQ2-9): Medium Risk  . PHQ-2 Score: 23  Financial Resource Strain: Not on file  Food Insecurity: Not on file  Housing: Not on file  Physical Activity: Not on file  Social Connections: Not on file  Stress: Not on file  Tobacco Use: High Risk  .  Smoking Tobacco Use: Current Every Day Smoker  . Smokeless Tobacco Use: Never Used  Transportation Needs: Not on file    Last Labs:  Admission on 10/14/2020, Discharged on 10/15/2020  Component Date Value Ref Range Status  . Sodium 10/14/2020 144  135 - 145 mmol/L Final  . Potassium 10/14/2020 3.3* 3.5 - 5.1 mmol/L Final  . Chloride 10/14/2020 108  98 - 111 mmol/L Final  . CO2 10/14/2020 24  22 - 32 mmol/L Final  . Glucose, Bld 10/14/2020 85  70 - 99 mg/dL Final   Glucose reference range applies only to samples taken after fasting for at least 8 hours.  . BUN 10/14/2020 7* 8 - 23 mg/dL Final  . Creatinine, Ser 10/14/2020 0.59  0.44 - 1.00 mg/dL Final  . Calcium 16/10/960412/27/2021 8.4* 8.9 - 10.3 mg/dL Final  . Total Protein 10/14/2020 6.7  6.5 - 8.1 g/dL Final  . Albumin 54/09/811912/27/2021 3.8  3.5 - 5.0 g/dL Final  . AST 14/78/295612/27/2021 26  15 - 41 U/L Final  . ALT 10/14/2020 16  0 - 44 U/L Final  . Alkaline Phosphatase 10/14/2020 72  38 - 126 U/L Final  . Total Bilirubin 10/14/2020 0.2* 0.3 - 1.2 mg/dL Final  . GFR, Estimated 10/14/2020 >60  >60 mL/min Final   Comment: (NOTE) Calculated using the CKD-EPI Creatinine Equation (2021)   . Anion gap 10/14/2020 12  5 - 15 Final   Performed at [facility], [address] [place], [place] [address]  . Alcohol, Ethyl (B) 10/14/2020 296* <10 mg/dL Final   Comment: (NOTE) Lowest detectable limit for serum alcohol is 10 mg/dL.  For medical purposes only. Performed at [facility], [address] [place], [place] [address]   . Salicylate Lvl 10/14/2020 <7.0* 7.0 - 30.0 mg/dL Final   Performed at [facility], [address] [place], [place] [address]  . Acetaminophen (Tylenol), Serum 10/14/2020 <10* 10 - 30 ug/mL Final   Comment: (NOTE) Therapeutic concentrations vary significantly. A range of 10-30 ug/mL  may be an effective concentration for many patients. However, some  are best treated at  concentrations outside of this range. Acetaminophen concentrations >150 ug/mL at 4 hours after ingestion  and >50 ug/mL at 12 hours after ingestion are often associated with  toxic reactions.  Performed at [facility], [address] [place], [place] [address]   . WBC 10/14/2020 4.4  4.0 - 10.5  K/uL Final  . RBC 10/14/2020 4.39  3.87 - 5.11 MIL/uL Final  . Hemoglobin 10/14/2020 13.4  12.0 - 15.0 g/dL Final  . HCT 16/07/9603 40.3  36.0 - 46.0 % Final  . MCV 10/14/2020 91.8  80.0 - 100.0 fL Final  . MCH 10/14/2020 30.5  26.0 - 34.0 pg Final  . MCHC 10/14/2020 33.3  30.0 - 36.0 g/dL Final  . RDW 54/06/8118 13.8  11.5 - 15.5 % Final  . Platelets 10/14/2020 281  150 - 400 K/uL Final  . nRBC 10/14/2020 0.0  0.0 - 0.2 % Final   Performed at [facility], [address] [place], [place] [address]  . Opiates 10/14/2020 NONE DETECTED  NONE DETECTED Final  . Cocaine 10/14/2020 NONE DETECTED  NONE DETECTED Final  . Benzodiazepines 10/14/2020 NONE DETECTED  NONE DETECTED Final  . Amphetamines 10/14/2020 NONE DETECTED  NONE DETECTED Final  . Tetrahydrocannabinol 10/14/2020 NONE DETECTED  NONE DETECTED Final  . Barbiturates 10/14/2020 NONE DETECTED  NONE DETECTED Final   Comment: (NOTE) DRUG SCREEN FOR MEDICAL PURPOSES ONLY.  IF CONFIRMATION IS NEEDED FOR ANY PURPOSE, NOTIFY LAB WITHIN 5 DAYS.  LOWEST DETECTABLE LIMITS FOR URINE DRUG SCREEN Drug Class                     Cutoff (ng/mL) Amphetamine and metabolites    1000 Barbiturate and metabolites    200 Benzodiazepine                 200 Tricyclics and metabolites     300 Opiates and metabolites        300 Cocaine and metabolites        300 THC                            50 Performed at [facility], [address], [place], [place] [address]   . SARS Coronavirus 2 by RT PCR 10/14/2020 NEGATIVE  NEGATIVE Final   Comment: (NOTE) SARS-CoV-2 target nucleic acids are NOT  DETECTED.  The SARS-CoV-2 RNA is generally detectable in upper respiratory specimens during the acute phase of infection. The lowest concentration of SARS-CoV-2 viral copies this assay can detect is 138 copies/mL. A negative result does not preclude SARS-Cov-2 infection and should not be used as the sole basis for treatment or other patient management decisions. A negative result may occur with  improper specimen collection/handling, submission of specimen other than nasopharyngeal swab, presence of viral mutation(s) within the areas targeted by this assay, and inadequate number of viral copies(<138 copies/mL). A negative result must be combined with clinical observations, patient history, and epidemiological information. The expected result is Negative.  Fact Sheet for Patients:  BloggerCourse.com  Fact Sheet for Healthcare Providers:  SeriousBroker.it  This test is no                          t yet approved or cleared by the [place] FDA and  has been authorized for detection and/or diagnosis of SARS-CoV-2 by FDA under an Emergency Use Authorization (EUA). This EUA will remain  in effect (meaning this test can be used) for the duration of the COVID-19 declaration under Section 564(b)(1) of the Act, 21 U.S.C.section 360bbb-3(b)(1), unless the authorization is terminated  or revoked sooner.      . Influenza A by PCR 10/14/2020 NEGATIVE  NEGATIVE Final  . Influenza  B by PCR 10/14/2020 NEGATIVE  NEGATIVE Final   Comment: (NOTE) The Xpert Xpress SARS-CoV-2/FLU/RSV plus assay is intended as an aid in the diagnosis of influenza from Nasopharyngeal swab specimens and should not be used as a sole basis for treatment. Nasal washings and aspirates are unacceptable for Xpert Xpress SARS-CoV-2/FLU/RSV testing.  Fact Sheet for Patients: BloggerCourse.com  Fact Sheet for Healthcare  Providers: SeriousBroker.it  This test is not yet approved or cleared by the [place] FDA and has been authorized for detection and/or diagnosis of SARS-CoV-2 by FDA under an Emergency Use Authorization (EUA). This EUA will remain in effect (meaning this test can be used) for the duration of the COVID-19 declaration under Section 564(b)(1) of the Act, 21 U.S.C. section 360bbb-3(b)(1), unless the authorization is terminated or revoked.  Performed at [facility], [address] [place], [place] [address]   Admission on 09/02/2020, Discharged on 09/03/2020  Component Date Value Ref Range Status  . SARS Coronavirus 2 Ag 09/02/2020 Negative  Negative Preliminary  . SARS Coronavirus 2 by RT PCR 09/02/2020 NEGATIVE  NEGATIVE Final   Comment: (NOTE) SARS-CoV-2 target nucleic acids are NOT DETECTED.  The SARS-CoV-2 RNA is generally detectable in upper respiratoy specimens during the acute phase of infection. The lowest concentration of SARS-CoV-2 viral copies this assay can detect is 131 copies/mL. A negative result does not preclude SARS-Cov-2 infection and should not be used as the sole basis for treatment or other patient management decisions. A negative result may occur with  improper specimen collection/handling, submission of specimen other than nasopharyngeal swab, presence of viral mutation(s) within the areas targeted by this assay, and inadequate number of viral copies (<131 copies/mL). A negative result must be combined with clinical observations, patient history, and epidemiological information. The expected result is Negative.  Fact Sheet for Patients:  https://www.moore.com/  Fact Sheet for Healthcare Providers:  https://www.young.biz/  This test is no                          t yet approved or cleared by the [place] FDA and  has been authorized for detection and/or diagnosis  of SARS-CoV-2 by FDA under an Emergency Use Authorization (EUA). This EUA will remain  in effect (meaning this test can be used) for the duration of the COVID-19 declaration under Section 564(b)(1) of the Act, 21 U.S.C. section 360bbb-3(b)(1), unless the authorization is terminated or revoked sooner.    . Influenza A by PCR 09/02/2020 NEGATIVE  NEGATIVE Final  . Influenza B by PCR 09/02/2020 NEGATIVE  NEGATIVE Final   Comment: (NOTE) The Xpert Xpress SARS-CoV-2/FLU/RSV assay is intended as an aid in  the diagnosis of influenza from Nasopharyngeal swab specimens and  should not be used as a sole basis for treatment. Nasal washings and  aspirates are unacceptable for Xpert Xpress SARS-CoV-2/FLU/RSV  testing.  Fact Sheet for Patients: https://www.moore.com/  Fact Sheet for Healthcare Providers: https://www.young.biz/  This test is not yet approved or cleared by the [place] FDA and  has been authorized for detection and/or diagnosis of SARS-CoV-2 by  FDA under an Emergency Use Authorization (EUA). This EUA will remain  in effect (meaning this test can be used) for the duration of the  Covid-19 declaration under Section 564(b)(1) of the Act, 21  U.S.C. section 360bbb-3(b)(1), unless the authorization is  terminated or revoked. Performed at [facility], [address], [place], [place] [address]   Admission on  09/01/2020, Discharged on 09/02/2020  Component Date Value Ref Range Status  . Lipase 09/01/2020 25  11 - 51 U/L Final   Performed at [facility], [address], [place], [place] [address]  . Sodium 09/01/2020 136  135 - 145 mmol/L Final  . Potassium 09/01/2020 3.1* 3.5 - 5.1 mmol/L Final  . Chloride 09/01/2020 100  98 - 111 mmol/L Final  . CO2 09/01/2020 16* 22 - 32 mmol/L Final  . Glucose, Bld 09/01/2020 175* 70 - 99 mg/dL Final   Glucose reference range applies only to samples taken after fasting for  at least 8 hours.  . BUN 09/01/2020 21  8 - 23 mg/dL Final  . Creatinine, Ser 09/01/2020 0.78  0.44 - 1.00 mg/dL Final  . Calcium 22/29/7989 8.9  8.9 - 10.3 mg/dL Final  . Total Protein 09/01/2020 7.3  6.5 - 8.1 g/dL Final  . Albumin 21/19/4174 4.4  3.5 - 5.0 g/dL Final  . AST 06/01/4817 54* 15 - 41 U/L Final  . ALT 09/01/2020 36  0 - 44 U/L Final  . Alkaline Phosphatase 09/01/2020 93  38 - 126 U/L Final  . Total Bilirubin 09/01/2020 0.5  0.3 - 1.2 mg/dL Final  . GFR, Estimated 09/01/2020 >60  >60 mL/min Final   Comment: (NOTE) Calculated using the CKD-EPI Creatinine Equation (2021)   . Anion gap 09/01/2020 20* 5 - 15 Final   Performed at [facility], [address] [place], [place] [address]  . WBC 09/01/2020 12.3* 4.0 - 10.5 K/uL Final  . RBC 09/01/2020 4.43  3.87 - 5.11 MIL/uL Final  . Hemoglobin 09/01/2020 14.1  12.0 - 15.0 g/dL Final  . HCT 97/11/6376 42.1  36.0 - 46.0 % Final  . MCV 09/01/2020 95.0  80.0 - 100.0 fL Final  . MCH 09/01/2020 31.8  26.0 - 34.0 pg Final  . MCHC 09/01/2020 33.5  30.0 - 36.0 g/dL Final  . RDW 58/85/0277 13.3  11.5 - 15.5 % Final  . Platelets 09/01/2020 318  150 - 400 K/uL Final  . nRBC 09/01/2020 0.0  0.0 - 0.2 % Final   Performed at [facility], [address], [place], [place] [address]  . Color, Urine 09/01/2020 YELLOW  YELLOW Final  . APPearance 09/01/2020 HAZY* CLEAR Final  . Specific Gravity, Urine 09/01/2020 1.026  1.005 - 1.030 Final  . pH 09/01/2020 5.0  5.0 - 8.0 Final  . Glucose, UA 09/01/2020 NEGATIVE  NEGATIVE mg/dL Final  . Hgb urine dipstick 09/01/2020 NEGATIVE  NEGATIVE Final  . Bilirubin Urine 09/01/2020 NEGATIVE  NEGATIVE Final  . Ketones, ur 09/01/2020 5* NEGATIVE mg/dL Final  . Protein, ur 86/76/7209 NEGATIVE  NEGATIVE mg/dL Final  . Nitrite 47/06/6282 NEGATIVE  NEGATIVE Final  . Glori Luis 09/01/2020 NEGATIVE  NEGATIVE Final   Performed at [facility], [address], [place], [place] [address]  . Alcohol, Ethyl (B) 09/01/2020 253* <10 mg/dL Final   Comment: (NOTE) Lowest detectable limit for serum alcohol is 10 mg/dL.  For medical purposes only. Performed at [facility], [address] [place], [place] [address]   . Opiates 09/02/2020 NONE DETECTED  NONE DETECTED Final  . Cocaine 09/02/2020 NONE DETECTED  NONE DETECTED Final  . Benzodiazepines 09/02/2020 NONE DETECTED  NONE DETECTED Final  . Amphetamines 09/02/2020 NONE DETECTED  NONE DETECTED Final  . Tetrahydrocannabinol 09/02/2020 POSITIVE* NONE DETECTED Final  . Barbiturates 09/02/2020 NONE DETECTED  NONE DETECTED Final  Comment: (NOTE) DRUG SCREEN FOR MEDICAL PURPOSES ONLY.  IF CONFIRMATION IS NEEDED FOR ANY PURPOSE, NOTIFY LAB WITHIN 5 DAYS.  LOWEST DETECTABLE LIMITS FOR URINE DRUG SCREEN Drug Class                     Cutoff (ng/mL) Amphetamine and metabolites    1000 Barbiturate and metabolites    200 Benzodiazepine                 200 Tricyclics and metabolites     300 Opiates and metabolites        300 Cocaine and metabolites        300 THC                            50 Performed at [facility], [address] [place], [place] [address]   . Sodium 09/02/2020 138  135 - 145 mmol/L Final  . Potassium 09/02/2020 3.7  3.5 - 5.1 mmol/L Final  . Chloride 09/02/2020 105  98 - 111 mmol/L Final  . CO2 09/02/2020 19* 22 - 32 mmol/L Final  . Glucose, Bld 09/02/2020 63* 70 - 99 mg/dL Final   Glucose reference range applies only to samples taken after fasting for at least 8 hours.  . BUN 09/02/2020 15  8 - 23 mg/dL Final  . Creatinine, Ser 09/02/2020 0.70  0.44 - 1.00 mg/dL Final  . Calcium 60/45/4098 8.3* 8.9 - 10.3 mg/dL Final  . GFR, Estimated 09/02/2020 >60  >60 mL/min Final   Comment: (NOTE) Calculated using the CKD-EPI Creatinine Equation (2021)   . Anion gap 09/02/2020 14  5 - 15 Final   Performed at [facility], [address] [place], [place] [address]  . Magnesium 09/02/2020 1.8  1.7 - 2.4 mg/dL Final   Performed at [facility], [address], [place], [place] [address]  . Phosphorus 09/02/2020 3.1  2.5 - 4.6 mg/dL Final   Performed at [facility], [address], [place], [place] [address]  . SARS Coronavirus 2 by RT PCR 09/02/2020 NEGATIVE  NEGATIVE Final   Comment: (NOTE) SARS-CoV-2 target nucleic acids are NOT DETECTED.  The SARS-CoV-2 RNA is generally detectable in upper respiratoy specimens during the acute phase of infection. The lowest concentration of SARS-CoV-2 viral copies this assay can detect is 131 copies/mL. A negative result does not preclude SARS-Cov-2 infection and should not be used as the sole basis for treatment or other patient management decisions. A negative result may occur with  improper specimen collection/handling, submission of specimen other than nasopharyngeal swab, presence of viral mutation(s) within the areas targeted by this assay, and inadequate number of viral copies (<131 copies/mL). A negative result must be combined with clinical observations, patient history, and epidemiological information. The expected result is Negative.  Fact Sheet for Patients:  https://www.moore.com/  Fact Sheet for Healthcare Providers:  https://www.young.biz/  This test is no                          t yet approved or cleared by the [place] FDA and  has been authorized for detection and/or diagnosis of SARS-CoV-2 by FDA under an Emergency Use Authorization (EUA). This EUA will remain  in effect (meaning this test can be used) for the duration of the COVID-19 declaration under Section 564(b)(1) of the Act, 21 U.S.C. section  360bbb-3(b)(1), unless the authorization is terminated or revoked sooner.    . Influenza A by PCR 09/02/2020 NEGATIVE  NEGATIVE Final  . Influenza B by  PCR 09/02/2020 NEGATIVE  NEGATIVE Final   Comment: (NOTE) The Xpert Xpress SARS-CoV-2/FLU/RSV assay is intended as an aid in  the diagnosis of influenza from Nasopharyngeal swab specimens and  should not be used as a sole basis for treatment. Nasal washings and  aspirates are unacceptable for Xpert Xpress SARS-CoV-2/FLU/RSV  testing.  Fact Sheet for Patients: https://www.moore.com/  Fact Sheet for Healthcare Providers: https://www.young.biz/  This test is not yet approved or cleared by the [place] FDA and  has been authorized for detection and/or diagnosis of SARS-CoV-2 by  FDA under an Emergency Use Authorization (EUA). This EUA will remain  in effect (meaning this test can be used) for the duration of the  Covid-19 declaration under Section 564(b)(1) of the Act, 21  U.S.C. section 360bbb-3(b)(1), unless the authorization is  terminated or revoked. Performed at [facility], [address] [place], [place] [address]   Admission on 06/09/2020, Discharged on 06/09/2020  Component Date Value Ref Range Status  . Sodium 06/09/2020 136  135 - 145 mmol/L Final  . Potassium 06/09/2020 3.2* 3.5 - 5.1 mmol/L Final  . Chloride 06/09/2020 100  98 - 111 mmol/L Final  . CO2 06/09/2020 27  22 - 32 mmol/L Final  . Glucose, Bld 06/09/2020 104* 70 - 99 mg/dL Final   Glucose reference range applies only to samples taken after fasting for at least 8 hours.  . BUN 06/09/2020 8  8 - 23 mg/dL Final  . Creatinine, Ser 06/09/2020 0.77  0.44 - 1.00 mg/dL Final  . Calcium 52/84/1324 8.9  8.9 - 10.3 mg/dL Final  . GFR calc non Af Amer 06/09/2020 >60  >60 mL/min Final  . GFR calc Af Amer 06/09/2020 >60  >60 mL/min Final  . Anion gap 06/09/2020 9  5 - 15 Final   Performed at [facility], [address] [place], [place] [address]  . WBC 06/09/2020 4.2  4.0 - 10.5 K/uL Final  . RBC 06/09/2020 3.68* 3.87 - 5.11 MIL/uL Final   . Hemoglobin 06/09/2020 12.2  12.0 - 15.0 g/dL Final  . HCT 72/53/6644 37.1  36.0 - 46.0 % Final  . MCV 06/09/2020 100.8* 80.0 - 100.0 fL Final  . MCH 06/09/2020 33.2  26.0 - 34.0 pg Final  . MCHC 06/09/2020 32.9  30.0 - 36.0 g/dL Final  . RDW 03/47/4259 13.3  11.5 - 15.5 % Final  . Platelets 06/09/2020 311  150 - 400 K/uL Final  . nRBC 06/09/2020 0.0  0.0 - 0.2 % Final   Performed at [facility], [address], [place], [place] [address]  . Magnesium 06/09/2020 1.8  1.7 - 2.4 mg/dL Final   Performed at [facility], [address], [place], [place] [address]  Admission on 06/08/2020, Discharged on 06/09/2020  Component Date Value Ref Range Status  . SARS Coronavirus 2 06/09/2020 NEGATIVE  NEGATIVE Final   Comment: (NOTE) SARS-CoV-2 target nucleic acids are NOT DETECTED.  The SARS-CoV-2 RNA is generally detectable in upper and lower respiratory specimens during the acute phase of infection. The lowest concentration of SARS-CoV-2 viral copies this assay can detect is 250 copies / mL. A negative result does not preclude SARS-CoV-2 infection and should not be used as the sole basis for treatment or other patient management decisions.  A  negative result may occur with improper specimen collection / handling, submission of specimen other than nasopharyngeal swab, presence of viral mutation(s) within the areas targeted by this assay, and inadequate number of viral copies (<250 copies / mL). A negative result must be combined with clinical observations, patient history, and epidemiological information.  Fact Sheet for Patients:   BoilerBrush.com.cy  Fact Sheet for Healthcare Providers: https://pope.com/  This test is not yet approved or                           cleared by the [place] FDA and has been authorized for detection and/or diagnosis of SARS-CoV-2 by FDA under an Emergency Use  Authorization (EUA).  This EUA will remain in effect (meaning this test can be used) for the duration of the COVID-19 declaration under Section 564(b)(1) of the Act, 21 U.S.C. section 360bbb-3(b)(1), unless the authorization is terminated or revoked sooner.  Performed at [facility], [address] [place], [place] [address]   . WBC 06/09/2020 6.9  4.0 - 10.5 K/uL Final  . RBC 06/09/2020 3.64* 3.87 - 5.11 MIL/uL Final  . Hemoglobin 06/09/2020 12.2  12.0 - 15.0 g/dL Final  . HCT 19/14/7829 37.7  36.0 - 46.0 % Final  . MCV 06/09/2020 103.6* 80.0 - 100.0 fL Final  . MCH 06/09/2020 33.5  26.0 - 34.0 pg Final  . MCHC 06/09/2020 32.4  30.0 - 36.0 g/dL Final  . RDW 56/21/3086 13.3  11.5 - 15.5 % Final  . Platelets 06/09/2020 302  150 - 400 K/uL Final  . nRBC 06/09/2020 0.0  0.0 - 0.2 % Final  . Neutrophils Relative % 06/09/2020 67  % Final  . Neutro Abs 06/09/2020 4.5  1.7 - 7.7 K/uL Final  . Lymphocytes Relative 06/09/2020 25  % Final  . Lymphs Abs 06/09/2020 1.7  0.7 - 4.0 K/uL Final  . Monocytes Relative 06/09/2020 7  % Final  . Monocytes Absolute 06/09/2020 0.5  0.1 - 1.0 K/uL Final  . Eosinophils Relative 06/09/2020 1  % Final  . Eosinophils Absolute 06/09/2020 0.1  0.0 - 0.5 K/uL Final  . Basophils Relative 06/09/2020 0  % Final  . Basophils Absolute 06/09/2020 0.0  0.0 - 0.1 K/uL Final  . Immature Granulocytes 06/09/2020 0  % Final  . Abs Immature Granulocytes 06/09/2020 0.03  0.00 - 0.07 K/uL Final   Performed at [facility], [address] [place], [place] [address]  . Sodium 06/09/2020 130* 135 - 145 mmol/L Final  . Potassium 06/09/2020 2.6* 3.5 - 5.1 mmol/L Final   Comment: CRITICAL RESULT CALLED TO, READ BACK BY AND VERIFIED WITH: L.LONDON RN @ [phone] 06/09/20 BY C.EDENS   . Chloride 06/09/2020 94* 98 - 111 mmol/L Final  . CO2 06/09/2020 21* 22 - 32 mmol/L Final  . Glucose, Bld 06/09/2020 122* 70 - 99 mg/dL Final   Glucose reference range applies only to  samples taken after fasting for at least 8 hours.  . BUN 06/09/2020 9  8 - 23 mg/dL Final  . Creatinine, Ser 06/09/2020 0.93  0.44 - 1.00 mg/dL Final  . Calcium 52/84/1324 9.0  8.9 - 10.3 mg/dL Final  . Total Protein 06/09/2020 6.3* 6.5 - 8.1 g/dL Final  . Albumin 40/07/2724 3.2* 3.5 - 5.0 g/dL Final  . AST 36/64/4034 24  15 - 41 U/L Final  . ALT 06/09/2020 11  0 - 44 U/L Final  . Alkaline  Phosphatase 06/09/2020 71  38 - 126 U/L Final  . Total Bilirubin 06/09/2020 0.6  0.3 - 1.2 mg/dL Final  . GFR calc non Af Amer 06/09/2020 >60  >60 mL/min Final  . GFR calc Af Amer 06/09/2020 >60  >60 mL/min Final  . Anion gap 06/09/2020 15  5 - 15 Final   Performed at [facility], [address] [place], [place] [address]  . Hgb A1c MFr Bld 06/09/2020 5.3  4.8 - 5.6 % Final   Comment: (NOTE) Pre diabetes:          5.7%-6.4%  Diabetes:              >6.4%  Glycemic control for   <7.0% adults with diabetes   . Mean Plasma Glucose 06/09/2020 105.41  mg/dL Final   Performed at [facility], [address], [place], [place] [address]  . Magnesium 06/09/2020 1.6* 1.7 - 2.4 mg/dL Final   Performed at [facility], [address], [place], [place] [address]  . Alcohol, Ethyl (B) 06/09/2020 <10  <10 mg/dL Final   Comment: (NOTE) Lowest detectable limit for serum alcohol is 10 mg/dL.  For medical purposes only. Performed at [facility], [address], [place], [place] [address]   . Cholesterol 06/09/2020 148  0 - 200 mg/dL Final  . Triglycerides 06/09/2020 79  <150 mg/dL Final  . HDL 58/59/2924 74  >40 mg/dL Final  . Total CHOL/HDL Ratio 06/09/2020 2.0  RATIO Final  . VLDL 06/09/2020 16  0 - 40 mg/dL Final  . LDL Cholesterol 06/09/2020 58  0 - 99 mg/dL Final   Comment:        Total Cholesterol/HDL:CHD Risk Coronary Heart Disease Risk Table                     Men   Women  1/2 Average Risk   3.4   3.3  Average Risk       5.0   4.4  2 X Average Risk   9.6   7.1  3 X  Average Risk  23.4   11.0        Use the calculated Patient Ratio above and the CHD Risk Table to determine the patient's CHD Risk.        ATP III CLASSIFICATION (LDL):  <100     mg/dL   Optimal  462-863  mg/dL   Near or Above                    Optimal  130-159  mg/dL   Borderline  817-711  mg/dL   High  >657     mg/dL   Very High Performed at [facility], [address], [place], [place] [address]   . TSH 06/09/2020 2.625  0.350 - 4.500 uIU/mL Final   Comment: Performed by a 3rd Generation assay with a functional sensitivity of <=0.01 uIU/mL. Performed at [facility], [address], [place], [place] [address]   . POC Amphetamine UR 06/09/2020 None Detected  None Detected Preliminary  . POC Secobarbital (BAR) 06/09/2020 None Detected  None Detected Preliminary  . POC Buprenorphine (BUP) 06/09/2020 None Detected  None Detected Preliminary  . POC Oxazepam (BZO) 06/09/2020 None Detected  None Detected Preliminary  . POC Cocaine UR 06/09/2020 None Detected  None Detected Preliminary  . POC Methamphetamine UR 06/09/2020 None Detected  None Detected Preliminary  . POC  Morphine 06/09/2020 Positive* None Detected Preliminary  . POC Oxycodone UR 06/09/2020 None Detected  None Detected Preliminary  . POC Methadone UR 06/09/2020 None Detected  None Detected Preliminary  . POC Marijuana UR 06/09/2020 None Detected  None Detected Preliminary  . SARS Coronavirus 2 Ag 06/09/2020 Negative  Negative Preliminary  . SARS Coronavirus 2 Ag 06/09/2020 NEGATIVE  NEGATIVE Final   Comment: (NOTE) SARS-CoV-2 antigen NOT DETECTED.   Negative results are presumptive.  Negative results do not preclude SARS-CoV-2 infection and should not be used as the sole basis for treatment or other patient management decisions, including infection  control decisions, particularly in the presence of clinical signs and  symptoms consistent with COVID-19, or in those who have been in contact with the virus.   Negative results must be combined with clinical observations, patient history, and epidemiological information. The expected result is Negative.  Fact Sheet for Patients: https://sanders-williams.net/  Fact Sheet for Healthcare Providers: https://martinez.com/   This test is not yet approved or cleared by the [place] FDA and  has been authorized for detection and/or diagnosis of SARS-CoV-2 by FDA under an Emergency Use Authorization (EUA).  This EUA will remain in effect (meaning this test can be used) for the duration of  the C                          OVID-19 declaration under Section 564(b)(1) of the Act, 21 U.S.C. section 360bbb-3(b)(1), unless the authorization is terminated or revoked sooner.    . Bilirubin, Direct 06/09/2020 0.1  0.0 - 0.2 mg/dL Final   Performed at [facility], [address] [place], [place] [address]  Admission on 06/05/2020, Discharged on 06/06/2020  Component Date Value Ref Range Status  . Lipase 06/05/2020 34  11 - 51 U/L Final   Performed at [facility], [address], [place], [place] [address]  . Sodium 06/05/2020 131* 135 - 145 mmol/L Final  . Potassium 06/05/2020 3.1* 3.5 - 5.1 mmol/L Final  . Chloride 06/05/2020 96* 98 - 111 mmol/L Final  . CO2 06/05/2020 23  22 - 32 mmol/L Final  . Glucose, Bld 06/05/2020 98  70 - 99 mg/dL Final   Glucose reference range applies only to samples taken after fasting for at least 8 hours.  . BUN 06/05/2020 <5* 8 - 23 mg/dL Final  . Creatinine, Ser 06/05/2020 0.71  0.44 - 1.00 mg/dL Final  . Calcium 09/81/1914 9.1  8.9 - 10.3 mg/dL Final  . Total Protein 06/05/2020 6.8  6.5 - 8.1 g/dL Final  . Albumin 78/29/5621 3.5  3.5 - 5.0 g/dL Final  . AST 30/86/5784 24  15 - 41 U/L Final  . ALT 06/05/2020 10  0 - 44 U/L Final  . Alkaline Phosphatase 06/05/2020 79  38 - 126 U/L Final  . Total Bilirubin 06/05/2020 0.6  0.3 - 1.2 mg/dL Final  . GFR calc non Af Amer  06/05/2020 >60  >60 mL/min Final  . GFR calc Af Amer 06/05/2020 >60  >60 mL/min Final  . Anion gap 06/05/2020 12  5 - 15 Final   Performed at [facility], [address] [place], [place] [address]  . WBC 06/05/2020 5.5  4.0 - 10.5 K/uL Final  . RBC 06/05/2020 4.45  3.87 - 5.11 MIL/uL Final  . Hemoglobin 06/05/2020 14.6  12.0 - 15.0 g/dL Final  . HCT 52/84/1324 44.5  36.0 - 46.0 % Final  .  MCV 06/05/2020 100.0  80.0 - 100.0 fL Final  . MCH 06/05/2020 32.8  26.0 - 34.0 pg Final  . MCHC 06/05/2020 32.8  30.0 - 36.0 g/dL Final  . RDW 16/07/9603 13.4  11.5 - 15.5 % Final  . Platelets 06/05/2020 283  150 - 400 K/uL Final  . nRBC 06/05/2020 0.0  0.0 - 0.2 % Final   Performed at [facility], [address], [place], [place] [address]  . Color, Urine 06/05/2020 STRAW* YELLOW Final  . APPearance 06/05/2020 CLEAR  CLEAR Final  . Specific Gravity, Urine 06/05/2020 1.002* 1.005 - 1.030 Final  . pH 06/05/2020 6.0  5.0 - 8.0 Final  . Glucose, UA 06/05/2020 NEGATIVE  NEGATIVE mg/dL Final  . Hgb urine dipstick 06/05/2020 NEGATIVE  NEGATIVE Final  . Bilirubin Urine 06/05/2020 NEGATIVE  NEGATIVE Final  . Ketones, ur 06/05/2020 NEGATIVE  NEGATIVE mg/dL Final  . Protein, ur 11/91/4782 NEGATIVE  NEGATIVE mg/dL Final  . Nitrite 95/62/1308 NEGATIVE  NEGATIVE Final  . Glori Luis 06/05/2020 NEGATIVE  NEGATIVE Final   Performed at [facility], [address] [place], [place] [address]  . Glucose-Capillary 06/05/2020 94  70 - 99 mg/dL Final   Glucose reference range applies only to samples taken after fasting for at least 8 hours.    Allergies: Cephalexin, Cephalosporins, Ketorolac, Prednisone, and Tramadol  PTA Medications: (Not in a hospital admission)   Medical Decision Making  Patient was medically cleared in the ED Labs reviewed  Initiate CIWA protocol with Ativan 1 mg every 6 hours prn for CIWA >10  Resume home medications -gabapentin 600 mg TID for chronic pain/alcohol  withdrawal -hydroxyzine 25 mg every 6 hours prn anxiety -protonix 40 mg daily for GERD -thiamine 100 mg daily for nutritional supplementation    Recommendations  Based on my evaluation the patient does not appear to have an emergency medical condition.  Jackelyn Poling, NP 10/15/20  3:59 AM

## 2020-10-15 NOTE — Progress Notes (Signed)
Patient has been accepted to [facility] for detox services at 5:00pm.   Reola Calkins, NP notified  Sharion Dove, RN notified     Baldo Daub, MSW, LCSW Clinical Social Worker (Facility Based Crisis) [facility]

## 2020-10-15 NOTE — ED Notes (Signed)
Pt sitting up on side of bed. Calm, cooperative with staff. Pt complain of generalized aches and diarrhea from withdrawal symptoms. Pt states, "I'm having withdrawals. I"m very depressed. I'm not really suicidal just depressed. I need somewhere to go to help me beat this ETOH addiction. I know I have a problem and I can't do it by myself". Support given. Tylenol, Immodium and Ativan given for withdrawal sx. MD aware of pt request. Safety maintained.

## 2021-05-09 ENCOUNTER — Emergency Department (HOSPITAL_COMMUNITY)
Admission: EM | Admit: 2021-05-09 | Discharge: 2021-05-10 | Disposition: A | Discharge status: Home / Self Care | Payer: Medicare Other | Attending: Emergency Medicine | Admitting: Emergency Medicine

## 2021-05-09 DIAGNOSIS — M545 Low back pain, unspecified: Secondary | ICD-10-CM | POA: Diagnosis not present

## 2021-05-09 DIAGNOSIS — R531 Weakness: Secondary | ICD-10-CM | POA: Insufficient documentation

## 2021-05-09 DIAGNOSIS — M542 Cervicalgia: Secondary | ICD-10-CM | POA: Insufficient documentation

## 2021-05-09 DIAGNOSIS — R197 Diarrhea, unspecified: Secondary | ICD-10-CM | POA: Insufficient documentation

## 2021-05-09 DIAGNOSIS — R42 Dizziness and giddiness: Secondary | ICD-10-CM | POA: Insufficient documentation

## 2021-05-09 DIAGNOSIS — G8929 Other chronic pain: Secondary | ICD-10-CM | POA: Diagnosis not present

## 2021-05-09 DIAGNOSIS — M79602 Pain in left arm: Secondary | ICD-10-CM | POA: Insufficient documentation

## 2021-05-09 DIAGNOSIS — F1010 Alcohol abuse, uncomplicated: Secondary | ICD-10-CM | POA: Diagnosis not present

## 2021-05-09 DIAGNOSIS — F17210 Nicotine dependence, cigarettes, uncomplicated: Secondary | ICD-10-CM | POA: Insufficient documentation

## 2021-05-09 DIAGNOSIS — Y908 Blood alcohol level of 240 mg/100 ml or more: Secondary | ICD-10-CM | POA: Insufficient documentation

## 2021-05-09 DIAGNOSIS — R1032 Left lower quadrant pain: Secondary | ICD-10-CM | POA: Diagnosis not present

## 2021-05-09 DIAGNOSIS — F10129 Alcohol abuse with intoxication, unspecified: Secondary | ICD-10-CM | POA: Diagnosis present

## 2021-05-09 NOTE — ED Triage Notes (Signed)
EMS reports called by concerned by stand pt laying in parking lot and homeless. EMS arrival pt ETOH on board denies any medical concerns/problems. States she thinks she has cancer

## 2021-05-10 ENCOUNTER — Other Ambulatory Visit: Payer: Self-pay

## 2021-05-10 ENCOUNTER — Emergency Department (HOSPITAL_COMMUNITY): Payer: Medicare Other

## 2021-05-10 DIAGNOSIS — F1010 Alcohol abuse, uncomplicated: Secondary | ICD-10-CM | POA: Diagnosis not present

## 2021-05-10 LAB — RAPID URINE DRUG SCREEN, HOSP PERFORMED
Amphetamines: NOT DETECTED
Barbiturates: NOT DETECTED
Benzodiazepines: NOT DETECTED
Cocaine: NOT DETECTED
Opiates: NOT DETECTED
Tetrahydrocannabinol: NOT DETECTED

## 2021-05-10 LAB — COMPREHENSIVE METABOLIC PANEL
ALT: 62 U/L — ABNORMAL HIGH (ref 0–44)
AST: 129 U/L — ABNORMAL HIGH (ref 15–41)
Albumin: 3.8 g/dL (ref 3.5–5.0)
Alkaline Phosphatase: 90 U/L (ref 38–126)
Anion gap: 14 (ref 5–15)
BUN: <5 mg/dL — ABNORMAL LOW (ref 8–23)
CO2: 20 mmol/L — ABNORMAL LOW (ref 22–32)
Calcium: 9.2 mg/dL (ref 8.9–10.3)
Chloride: 104 mmol/L (ref 98–111)
Creatinine, Ser: 0.54 mg/dL (ref 0.44–1.00)
GFR, Estimated: >60 mL/min (ref >60)
Glucose, Bld: 94 mg/dL (ref 70–99)
Potassium: 3.5 mmol/L (ref 3.5–5.1)
Sodium: 138 mmol/L (ref 135–145)
Total Bilirubin: 0.7 mg/dL (ref 0.3–1.2)
Total Protein: 7 g/dL (ref 6.5–8.1)

## 2021-05-10 LAB — ACETAMINOPHEN LEVEL: Acetaminophen (Tylenol), Serum: <10 ug/mL — ABNORMAL LOW (ref 10–30)

## 2021-05-10 LAB — CBC WITH DIFFERENTIAL/PLATELET
Abs Immature Granulocytes: 0.01 10*3/uL (ref 0.00–0.07)
Basophils Absolute: 0.1 10*3/uL (ref 0.0–0.1)
Basophils Relative: 1 %
Eosinophils Absolute: 0.1 10*3/uL (ref 0.0–0.5)
Eosinophils Relative: 1 %
HCT: 40.9 % (ref 36.0–46.0)
Hemoglobin: 14.2 g/dL (ref 12.0–15.0)
Immature Granulocytes: 0 %
Lymphocytes Relative: 52 %
Lymphs Abs: 2.7 10*3/uL (ref 0.7–4.0)
MCH: 34 pg (ref 26.0–34.0)
MCHC: 34.7 g/dL (ref 30.0–36.0)
MCV: 97.8 fL (ref 80.0–100.0)
Monocytes Absolute: 0.3 10*3/uL (ref 0.1–1.0)
Monocytes Relative: 7 %
Neutro Abs: 2 10*3/uL (ref 1.7–7.7)
Neutrophils Relative %: 39 %
Platelets: 215 10*3/uL (ref 150–400)
RBC: 4.18 MIL/uL (ref 3.87–5.11)
RDW: 13.8 % (ref 11.5–15.5)
WBC: 5.1 10*3/uL (ref 4.0–10.5)
nRBC: 0 % (ref 0.0–0.2)

## 2021-05-10 LAB — ETHANOL: Alcohol, Ethyl (B): 304 mg/dL (ref <10)

## 2021-05-10 LAB — CBG MONITORING, ED: Glucose-Capillary: 83 mg/dL (ref 70–99)

## 2021-05-10 LAB — SALICYLATE LEVEL: Salicylate Lvl: <7 mg/dL — ABNORMAL LOW (ref 7.0–30.0)

## 2021-05-10 MED ORDER — SODIUM CHLORIDE 0.9 % IV BOLUS
1000.0000 mL | Freq: Once | INTRAVENOUS | Status: AC
  Administered 2021-05-10: 1000 mL via INTRAVENOUS

## 2021-05-10 MED ORDER — LORAZEPAM 2 MG/ML IJ SOLN: 1.0000 mg | Freq: Once | INTRAMUSCULAR | Status: DC

## 2021-05-10 MED ORDER — GABAPENTIN 300 MG PO CAPS: 600.0000 mg | ORAL_CAPSULE | Freq: Three times a day (TID) | ORAL | 0 refills | Status: DC

## 2021-05-10 MED ORDER — LORAZEPAM 1 MG PO TABS
1.0000 mg | ORAL_TABLET | Freq: Once | ORAL | Status: AC
  Administered 2021-05-10: 1 mg via ORAL
  Filled 2021-05-10: qty 1

## 2021-05-10 MED ORDER — NAPROXEN 250 MG PO TABS
500.0000 mg | ORAL_TABLET | Freq: Once | ORAL | Status: AC
  Administered 2021-05-10: 500 mg via ORAL
  Filled 2021-05-10: qty 2

## 2021-05-10 NOTE — ED Triage Notes (Signed)
Pt c/o chronic left arm pain, chronic head pain, Chronic neck pain following head trauma 1 year ago.   EOTH on board per EMS.

## 2021-05-10 NOTE — ED Notes (Signed)
TTS at bedside. 

## 2021-05-10 NOTE — ED Provider Notes (Signed)
Emergency Medicine Provider Triage Evaluation Note  [name] , a 65 y.o. female  was evaluated in triage.  Pt complains of alcohol abuse relapse.  States she is a recovering alcoholic, but had ETOH today.  Complains of feeling dizzy for several weeks.  States she is homeless.  Complains of chronic pain in extremities.  Review of Systems  Positive: Dizziness, painful joints Negative: Fever, chills  Physical Exam  BP 114/77 (BP Location: Right Arm)   Pulse 81   Temp 99.1 F (37.3 C) (Oral)   Resp 16   Ht 5\' 1"  (1.549 m)   Wt 56.7 kg   SpO2 98%   BMI 23.62 kg/m  Gen:   Awake, no distress   Resp:  Normal effort  MSK:   Moves extremities without difficulty  Other:    Medical Decision Making  Medically screening exam initiated at 2:05 AM.  Appropriate orders placed.  Lailee Hoelzel was informed that the remainder of the evaluation will be completed by another provider, this initial triage assessment does not replace that evaluation, and the importance of remaining in the ED until their evaluation is complete.  Alcohol problem   [name], Roxy Horseman 05/10/21 0206    05/12/21, MD 05/10/21 [phone]

## 2021-05-10 NOTE — ED Triage Notes (Signed)
Pt c/o of dizziness for PA. Further workup required. Acuity level changed

## 2021-05-10 NOTE — BH Assessment (Signed)
Comprehensive Clinical Assessment (CCA) Note  05/10/2021 [name] [id number]  Disposition: Per Ophelia Shoulder, NP, patient is psych cleared for discharge with referral resources for [facility] Treatment and Detox Centers  The patient demonstrates the following risk factors for suicide: Chronic risk factors for suicide include: psychiatric disorder of depression, substance use disorder, and chronic pain. Acute risk factors for suicide include: social withdrawal/isolation and loss (financial, interpersonal, professional). Protective factors for this patient include: responsibility to others (children, family), hope for the future, and religious beliefs against suicide. Considering these factors, the overall suicide risk at this point appears to be low. Patient is appropriate for outpatient follow up.   AIMS    Flowsheet Row [facility] Ad[facility] ([facility]) [facility]  AIMS Total Score 0 0      AUDIT    Flowsheet Row [facility]) [facility] Ad[facility] ([facility]charged) [facility]  Alcohol Use Disorder Identification Test Final Score (AUDIT) 34 34      PHQ2-9    Flowsheet Row ED from 05/09/2021 in [facility]Y DEPARTMENT ED from [facility]/14/2020 in [facility] from 06/08/2020 in [facility]  PHQ-2 Total Score 4 6 6   PHQ-9 Total Score 17 23 27       Flowsheet Row ED from 05/09/2021 in [facility] EMERGENCY DEPARTMENT ED from 10/15/2020 in [facility] ED from 10[facility]14/2020 in [facility]  C-SSRS RISK CATEGORY No Risk Moderate Risk High Risk        Chief Complaint:  Chief Complaint   Patient presents with   Generalized Chronic Pain   Alcohol Intoxication   Visit Diagnosis: F10.20 Alcohol Use Disorder Severe   CCA Screening, Triage and Referral (STR)  Patient Reported Information How did you hear about [facility]? Self  What Is the Reason for Your Visit/Call Today? Patient presents to the [facility] seeking help for her alcohol problem.  Patient states that she relapsed in November after being sober for a couple years.  She was at an  at the [facility] for 5-6 weeks only to be discharged because of her relapse and to become homeless.  Patient states that she has most recently been living in hotels, but sounds like she has run out of money because she is requesting to taty in the hospital for a week, get some ativan to keep from having withdrawal seizures and she states that she wants to get back on her Gabapentin and Prozac.  When patient was informed that we would have to seek detox placement for her, she stated "where would that be?"  When she was informed that it would be either in [facility] at one of the [facility], patient states that she did not want to go there and stated that she used to work there and there was no public transportation there.  Again she reiterated her desire to stay in the hospital so that she could get a cheaper room across town when she receives her disability check next week.  Informed her that she most likely would not meet medical admission criteria at the hospital and that [facility] had no beds today. Patient also endorses drinking more heavily than usual and states that she drank 1 pint of vodka and 12 beers yesterday. Last drink  was sometime yesterday afternoon. For the last 6 months she endorses drinking a 12 pack of beer daily.  Patient states that she has experienced suicidal thoughts in the past, but states that she has never acted on them.  Patient states that she was hospitalized at [facility] many years ago and her chart reflects that she was  at [facility] one year ago.  Patient denies HI/Psychosis.  She states that she has been drinking daily, but would not quantify an amount.  Patient denies any drug use. She states, "I drink as much as I can get."  Patient states that she has no support, she states that she has not been able to sleep or eat. Patient states that she has no support, she states that she has not been able to sleep or eat.  Patient is alert and oriented.  Her judgment, insight and impulse control are chronically impaired due to her alcohol use.  Her thoughts are organized and her memory is intact.  She is depressed and has a flat affect.  Patient does not appear to be responding to any internal stimuli.  How Long Has This Been Causing You Problems? > than 6 months  What Do You Feel Would Help You the Most Today? Alcohol or Drug Use Treatment   Have You Recently Had Any Thoughts About Hurting Yourself? No  Are You Planning to Commit Suicide/Harm Yourself At This time? No   Have you Recently Had Thoughts About Hurting Someone Karolee Ohslse? No  Are You Planning to Harm Someone at This Time? No  Explanation: No data recorded  Have You Used Any Alcohol or Drugs in the Past 24 Hours? Yes  How Long Ago Did You Use Drugs or Alcohol? No data recorded What Did You Use and How Much? yesterday, pint of liquor and 12 beers   Do You Currently Have a Therapist/Psychiatrist? No  Name of Therapist/Psychiatrist: No data recorded  Have You Been Recently Discharged From Any Office Practice or Programs? No  Explanation of Discharge From Practice/Program: No data recorded    CCA Screening Triage Referral Assessment Type of Contact: Tele-Assessment  Telemedicine Service Delivery:   Is this Initial or Reassessment? Initial Assessment  Date Telepsych consult ordered in [facility]:  05/10/21  Time [facility]psych [facility]  1241  [facility]  Provider Location: Other (comment) (off site)   Collateral  Involvement: None   Does Patient Have a Court Appointed Legal Guardian? No data recorded Name and Contact of Legal Guardian: NA  If Minor and Not Living with Parent(s), Who has Custody? NA  Is CPS involved or ever been involved? Never  Is APS involved or ever been involved? Never   Patient Determined To Be At Risk for Harm To Self or Others Based on Review of Patient Reported Information or Presenting Complaint? No  Method: No data recorded Availability of Means: No data recorded Intent: No data recorded Notification Required: No data recorded Additional Information for Danger to Others Potential: No data recorded Additional Comments for Danger to Others Potential: No data recorded Are There Guns or Other Weapons in Your Home? No data recorded Types of Guns/Weapons: No data recorded Are These Weapons Safely Secured?                            No data recorded Who Could Verify You Are Able To Have These Secured: No data recorded Do You Have any Outstanding Charges,  Pending Court Dates, Parole/Probation? No data recorded Contacted To Inform of Risk of Harm To Self or Others: Unable to Contact:    Does Patient Present under Involuntary Commitment? No  IVC Papers Initial File Date: No data recorded  [place] of Residence: [place]   Patient Currently Receiving the Following Services: Not Receiving Services   Determination of Need: Emergent (2 hours)   Options For Referral: -- (Patient is recommended for inpatient SA detox and treatment)     CCA Biopsychosocial Patient Reported Schizophrenia/Schizoaffective Diagnosis in Past: No   Strengths: Pt has history of maintaining sobriety for up to five years.   Mental Health Symptoms Depression:   Change in energy/activity; Fatigue; Hopelessness; Increase/decrease in appetite; Worthlessness; Sleep (too much or little); Irritability   Duration of Depressive symptoms:  Duration of Depressive Symptoms: Greater than two  weeks   Mania:   None   Anxiety:    Difficulty concentrating; Fatigue; Irritability; Sleep; Tension; Worrying   Psychosis:   None   Duration of Psychotic symptoms:    Trauma:   Avoids reminders of event; Detachment from others; Emotional numbing; Guilt/shame   Obsessions:   None   Compulsions:   None   Inattention:   N/A   Hyperactivity/Impulsivity:   N/A   Oppositional/Defiant Behaviors:   N/A   Emotional Irregularity:   Chronic feelings of emptiness; Intense/unstable relationships   Other Mood/Personality Symptoms:   NA    Mental Status Exam Appearance and self-care  Stature:   Average   Weight:   Average weight   Clothing:   Casual   Grooming:   Neglected   Cosmetic use:   Age appropriate   Posture/gait:   Normal   Motor activity:   Not Remarkable   Sensorium  Attention:   Normal   Concentration:   Normal   Orientation:   X5   Recall/memory:   Normal   Affect and Mood  Affect:   Depressed   Mood:   Depressed; Pessimistic; Worthless   Relating  Eye contact:   Normal   Facial expression:   Depressed; Sad   Attitude toward examiner:   Cooperative   Thought and Language  Speech flow:  Normal   Thought content:   Appropriate to Mood and Circumstances   Preoccupation:   None   Hallucinations:   Other (Comment)   Organization:  No data recorded  [employer] of Knowledge:   Average   Intelligence:   Average   Abstraction:   Normal   Judgement:   Impaired   Reality Testing:   Adequate   Insight:   Gaps   Decision Making:   Normal   Social Functioning  Social Maturity:   Isolates   Social Judgement:   Victimized   Stress  Stressors:   Relationship; Financial; Grief/losses; Family conflict; Housing   Coping Ability:   Deficient supports; Exhausted   Skill Deficits:   Responsibility   Supports:   Support needed     Religion: Religion/Spirituality Are You A  Religious Person?: Yes What is Your Religious Affiliation?: Christian  Leisure/Recreation: Leisure / Recreation Do You Have Hobbies?: Yes Leisure and Hobbies: Drawing, watching movies.  Exercise/Diet: Exercise/Diet Do You Exercise?: Yes What Type of Exercise Do You Do?: Run/Walk How Many Times a Week Do You Exercise?: 4-5 times a week Have You Gained or Lost A Significant Amount of Weight in the Past Six Months?: Yes-Lost Number of Pounds Lost?: 10 Do You Follow a Special Diet?: No Do  You Have Any Trouble Sleeping?: Yes Explanation of Sleeping Difficulties: Pt reports poor sleep.   CCA Employment/Education Employment/Work Situation: Employment / Work Systems developer: On disability Why is Patient on Disability: neck fusion. "my vertabrae started overlapping. " How Long has Patient Been on Disability: 4 years Patient's Job has Been Impacted by Current Illness: No Has Patient ever Been in the [employer]?: No  Education: Education Is Patient Currently Attending School?: No Last Grade Completed: 12 Did You Attend College?: No Did You Have An Individualized Education Program (IIEP): No Did You Have Any Difficulty At School?: No Patient's Education Has Been Impacted by Current Illness: No   CCA Family/Childhood History Family and Relationship History: Family history Marital status: Single Does patient have children?: Yes How many children?: 2 How is patient's relationship with their children?: "They shun me"  Childhood History:  Childhood History By whom was/is the patient raised?: Mother, Grandparents Did patient suffer any verbal/emotional/physical/sexual abuse as a child?: No Did patient suffer from severe childhood neglect?: No Witnessed domestic violence?: No Has patient been affected by domestic violence as an adult?: No  Child/Adolescent Assessment:     CCA Substance Use Alcohol/Drug Use: Alcohol / Drug Use Pain Medications: See  MAR Prescriptions: See MAR Over the Counter: See MAR History of alcohol / drug use?: Yes Longest period of sobriety (when/how long): 5 years Negative Consequences of Use: Financial, Personal relationships, Armed forces operational officer, Work / School Withdrawal Symptoms: None (no current withdrawal symptoms, but has a history of seizures) Substance #1 Name of Substance 1: alcohol 1 - Age of First Use: UTA 1 - Amount (size/oz): 1 pint liquor, 12 beers 1 - Frequency: daily 1 - Duration: since onset 1 - Last Use / Amount: 12 beers and a pint yesterday 1 - Method of Aquiring: stores 1- Route of Use: oral                       ASAM's:  Six Dimensions of Multidimensional Assessment  Dimension 1:  Acute Intoxication and/or Withdrawal Potential:   Dimension 1:  Description of individual's past and current experiences of substance use and withdrawal: Patient is a chronic alcoholic who has withdrawal complications and requires detox  Dimension 2:  Biomedical Conditions and Complications:   Dimension 2:  Description of patient's biomedical conditions and  complications: Patient has chronic pain issues that she self-medicates with alcohol  Dimension 3:  Emotional, Behavioral, or Cognitive Conditions and Complications:  Dimension 3:  Description of emotional, behavioral, or cognitive conditions and complications: Patient uses alcohol to self-medicate her emotions which exacerbates her depression  Dimension 4:  Readiness to Change:  Dimension 4:  Description of Readiness to Change criteria: Patient was living in a recovery house and relapsed and is drinking daily  Dimension 5:  Relapse, Continued use, or Continued Problem Potential:  Dimension 5:  Relapse, continued use, or continued problem potential critiera description: Patient is a chronic relapser  Dimension 6:  Recovery/Living Environment:  Dimension 6:  Recovery/Iiving environment criteria description: Patient is homeless and has no support  ASAM Severity  Score:    ASAM Recommended Level of Treatment:     Substance use Disorder (SUD) Substance Use Disorder (SUD)  Checklist Symptoms of Substance Use: Continued use despite having a persistent/recurrent physical/psychological problem caused/exacerbated by use, Continued use despite persistent or recurrent social, interpersonal problems, caused or exacerbated by use, Evidence of tolerance, Evidence of withdrawal (Comment), Large amounts of time spent to obtain, use or recover from  the substance(s), Persistent desire or unsuccessful efforts to cut down or control use, Recurrent use that results in a failure to fulfill major role obligations (work, school, home), Repeated use in physically hazardous situations, Substance(s) often taken in larger amounts or over longer times than was intended, Social, occupational, recreational activities given up or reduced due to use  Recommendations for Services/Supports/Treatments: Recommendations for Services/Supports/Treatments Recommendations For Services/Supports/Treatments: Other (Comment) (SA detox and residential treatment)  Discharge Disposition:    DSM5 Diagnoses: Patient Active Problem List   Diagnosis Date Noted   Alcohol-induced mood disorder (HCC)    Closed displaced oblique fracture of shaft of left humerus 08/01/2020   Diverticulitis 02/29/2020   Acute diverticulitis 02/28/2020   GERD (gastroesophageal reflux disease) 02/28/2020   Suicidal ideation    Delirium tremens (HCC) 12/13/2018   Thrombocytopenia (HCC) 12/13/2018   Homeless 12/13/2018   Depression 12/13/2018   Neuropathy 12/13/2018   Subarachnoid hemorrhage (HCC) 12/11/2018   Seizure due to alcohol withdrawal (HCC) 12/11/2018   Alcohol dependence with alcohol-induced mood disorder (HCC)    MDD (major depressive disorder), recurrent severe, 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

## 2021-05-10 NOTE — BH Assessment (Signed)
Clinician made contact with pt's team via internal IM requesting to move pt into a room with the Tele-Assessment machine so pt's MH Assessment can be completed. Pt is currently in the hallway and in the process of having an IV placed; pt is a hard stick and the IV team is working with her now. At 1129 clinician made contact with pt's team again stating she must leave at 1200 so would not be able to complete pt's assessment in time. Pt's nurse, Irving Burton RN, expressed an understanding. TTS to see pt once she is done getting her IV in and in a room.

## 2021-05-10 NOTE — Discharge Instructions (Signed)
[facility] - Admissions are currently completed Monday through Friday at 8am; both appointments and walk-ins are accepted.  Any individual that is a [place] resident may present for a substance abuse screening and assessment for admission.  A person may be referred by numerous sources or self-refer.   Potential clients will be screened for medical necessity and appropriateness for the program.  Clients must meet criteria for high-intensity residential treatment services.  If clinically appropriate, a client will continue with the comprehensive clinical assessment and intake process, as well as enrollment in the [facility].  Address: [address] [place], [place] [address] Admin Hours: Mon-Fri 8AM to 5PM Center Hours: 24/7 Phone: [phone] Fax: [phone]  [facility] (Detox) Facility Based Crisis:  These are 3 locations for services: Please call before arrival   Address: [address] [place], [place] [address] Phone: [phone]  Address: [address], [place], [place] [address] Phone#: [phone]  Address: [address], [place], [place] [address] Phone#: [phone]   [facility] (offers outpatient therapy and intensive outpatient substance abuse therapy).  [address], [place], [place] [address] Phone: [phone]  [facility]: Offers FREE recovery skills classes, support groups, 1:1 Peer Support, and Compeer Classes. [address], [place], [place] [address] Phone: [phone] (Call to complete intake).  [facility] [address] [place], [place] [address] Phone: [phone] ext: [phone] [facility] provides food, shelter and other programs and services to the homeless men of [place] through our men's program.  By offering safe shelter, three meals a day, clean clothing, Biblical counseling,  financial planning, vocational training, GED/education and employment assistance, we've helped mend the shattered lives of many homeless men since opening in 1974.  We have approximately 267 beds available, with a max of 312 beds including mats for emergency situations and currently house an average of 270 men a night.  Prospective Client Check-In Information Photo ID Required (State/ Out of State/ DOC) - if photo ID is not available, clients are required to have a printout of a police/sheriff's criminal history report. Help out with chores around the Mission. No sex offender of any type (pending, charged, registered and/or any other sex related offenses) will be permitted to check in. Must be willing to abide by all rules, regulations, and policies established by the [facility]. The following will be provided - shelter, food, clothing, and biblical counseling. If you or someone you know is in need of assistance at our men's shelter in [place], [place], please call [phone] ext. [phone].  [facility]-will provide timely access to mental health services for children and adolescents (4-17) and adults presenting in a mental health crisis. The program is designed for those who need urgent Behavioral Health or Substance Use treatment and are not experiencing a medical crisis that would typically require an emergency room visit.    [address] [place], [place] [address] Phone: [phone] [email or web address]  [facility]: Phone#: [phone]  The [facility] SA Intensive Outpatient Program (SAIOP) means structured individual and group addiction activities and services that are provided at an outpatient program designed to assist adult and adolescent consumers to begin recovery and learn skills for recovery maintenance. The [facility] SAIOP program is offered at least 3 hours a day, 3 days a week.SAIOP services shall  include a structured program consisting of,   but not limited to, the following services: Individual counseling and support; Group counseling and support; Family counseling, training or support; Biochemical assays to identify recent drug use (e.g., urine drug screens); Strategies for relapse prevention to include community and social support systems in treatment; Life skills; Crisis contingency planning; Disease Management; and Treatment support activities that have been adapted or specifically designed for persons with physical disabilities, or persons with co-occurring disorders of mental illness and substance abuse/dependence or mental retardation/developmental disability and substance abuse/dependence. Phone: [phone]  Address:   [facility] [place] [facility] will also offer the following outpatient services: (Monday through Friday 8am-5pm)    Partial Hospitalization Program (PHP)  Substance Abuse Intensive Outpatient Program (SA-IOP)  Group Therapy  Medication Management  Peer Living Room We also provide (24/7):  Assessments: Our mental health clinician and providers will conduct a focused mental health evaluation, assessing for immediate safety concerns and further mental health needs. Referral: Our team will provide resources and help connect to community based mental health treatment, when indicated, including psychotherapy, psychiatry, and other specialized behavioral health or substance use disorder services (for those not already in treatment). Transitional Care: Our team providers in person bridging and/or telephonic follow-up during the patient's transition to outpatient services.  [facility] 24-Hour Call Center: 1-800-256-2452 Behavioral Health Crisis Line: [phone]  

## 2021-05-10 NOTE — Progress Notes (Signed)
CSW provided the following resources:   [facility] - Admissions are currently completed Monday through Friday at 8am; both appointments and walk-ins are accepted.  Any individual that is a [facility] resident may present for a substance abuse screening and assessment for admission.  A person may be referred by numerous sources or self-refer.   Potential clients will be screened for medical necessity and appropriateness for the program.  Clients must meet criteria for high-intensity residential treatment services.  If clinically appropriate, a client will continue with the comprehensive clinical assessment and intake process, as well as enrollment in the [facility].  Address: [address] [place], [place] [address] Admin Hours: Mon-Fri 8AM to [facility] Hours: 24/7 Phone: [phone] Fax: [phone]  [facility] (Det[facility]) Facility Based Crisis:  These are 3 locations for services: Please call before arrival    [facility] Address: [address] [place], [place] [address] Phone: [phone]  [facility] Address: [address] [place], [place] [address] Phone#: [phone]  [facility] Address: [address] [place], [place] [address] Phone#: [phone]   [facility] (offers outpatient therapy and intensive outpatient substance abuse therapy).  [address] [place], [place] [address] Phone: [phone]  [facility]: Offers FREE recovery skills classes, support groups, 1:1 Peer Support, and Compeer Classes. [address], [place], [place] [address] Phone: [phone] (Call to complete intake).  [facility] [address] [place], [place] [address] Phone: [phone] ext: [phone] The [facility] provides food, shelter and other programs and services to the homeless men of  [place] through our [employer].  By offering safe shelter, three meals a day, clean clothing, Biblical counseling, financial planning, vocational training, GED/education and employment assistance, we've helped mend the shattered lives of many homeless men since opening in 1974.  We have approximately 267 beds available, with a max of 312 beds including mats for emergency situations and currently house an average of 270 men a night.  Prospective Client Check-In Information Photo ID Required (State/ Out of State/ [facility]) - if photo ID is not available, clients are required to have a printout of a police/sheriff's criminal history report. Help out with chores around the [facility]. No sex offender of any type (pending, charged, registered and/or any other sex related offenses) will be permitted to check in. Must be willing to abide by all rules, regulations, and policies established by the [employer]. The following will be provided - shelter, food, clothing, and biblical counseling. If you or someone you know is in need of assistance at our [facility] shelter in [place], [place], please call [phone] ext. [phone].  [facility]-will provide timely access to mental health services for children and adolescents (4-17) and adults presenting in a mental health crisis. The program is designed for those who need urgent Behavioral Health or Substance Use treatment and are not experiencing a medical crisis that would typically require an emergency room visit.    [address] [place], [place] [address] Phone: [phone] [email or web address]  [facility]: Phone#: [phone]  The [facility] SA Intensive Outpatient Program (SAIOP) means structured individual and group addiction activities and services that are provided at an outpatient program designed to assist adult and adolescent consumers to begin recovery and  learn skills for recovery maintenance. The [facility] SAIOP program is offered at  least 3 hours a day, 3 days a week.SAIOP services shall include a structured program consisting of, but not limited to, the following services: Individual counseling and support; Group counseling and support; Family counseling, training or support; Biochemical assays to identify recent drug use (e.g., urine drug screens); Strategies for relapse prevention to include community and social support systems in treatment; Life skills; Crisis contingency planning; Disease Management; and Treatment support activities that have been adapted or specifically designed for persons with physical disabilities, or persons with co-occurring disorders of mental illness and substance abuse/dependence or mental retardation/developmental disability and substance abuse/dependence. Phone: [phone]  Address:   The [facility] will also offer the following outpatient services: (Monday through Friday 8am-5pm)   Partial Hospitalization Program (PHP) Substance Abuse Intensive Outpatient Program (SA-IOP) Group Therapy Medication Management Peer Living Room We also provide (24/7):  Assessments: Our mental health clinician and providers will conduct a focused mental health evaluation, assessing for immediate safety concerns and further mental health needs. Referral: Our team will provide resources and help connect to community based mental health treatment, when indicated, including psychotherapy, psychiatry, and other specialized behavioral health or substance use disorder services (for those not already in treatment). Transitional Care: Our team providers in person bridging and/or telephonic follow-up during the patient's transition to outpatient services.  The [facility] 24-Hour Call Center: [phone] Behavioral Health Crisis Line: [phone]  Crissie Reese, MSW, LCSW-A, [place] Phone:  [phone] Disposition/TOC

## 2021-05-10 NOTE — ED Provider Notes (Signed)
[facility] EMERGENCY DEPARTMENT Provider Note   CSN: [id number] Arrival date & time: 05/09/21  2217     History Chief Complaint  Patient presents with   Generalized Chronic Pain   Alcohol Intoxication    Shyana Kulakowski is a 65 y.o. female with a PMHx of alcohol abuse with alcoholic withdrawal seizures, chronic pain, pancreatitis, herniated cervical disc. She is presenting to the ED for several days of dizziness and chronic pain in her extremities and low back.  Patient states that her pain is worse in her left arm has a history of surgery, as well as neck pain, and headache. Her chronic pain is not worse than her baseline. Patient also states that she fell and is unsure if she hit her head but she states that she did lose consciousness and was picked up by EMS. She notes that she is homeless and sleeps in the woods. Patient also endorses drinking more heavily than usual and states that she drank 1 pint of vodka and 12 beers yesterday. Last drink was sometime yesterday afternoon. For the last 6 months she endorses drinking a 12 pack of beer daily. She has a hx of withdrawal seizures and hallucinations but has not had any episodes recently. Patient also endorses left lower quadrant abd pain, nausea, vomiting, and diarrhea. She denies any fevers, chills, chest pain, SOB, melena, hematochezia, dysuria, hematuria, or any other sxs at this time. Patient states that she has smoked for about 10-11 years but now has cut back and has only had a couple cigarettes a day.   The history is provided by the patient.  Alcohol Intoxication Associated symptoms include abdominal pain. Pertinent negatives include no chest pain and no shortness of breath.      Past Medical History:  Diagnosis Date   Alcohol dependence (HCC)    Anxiety    Chronic pain    Depression    Diverticulitis    Herniated cervical disc    Pancreatitis    Seizures (HCC)    alcoholic seizures    Patient Active  Problem List   Diagnosis Date Noted   Alcohol-induced mood disorder (HCC)    Closed displaced oblique fracture of shaft of left humerus 08/01/2020   Diverticulitis 02/29/2020   Acute diverticulitis 02/28/2020   GERD (gastroesophageal reflux disease) 02/28/2020   Suicidal ideation    Delirium tremens (HCC) 12/13/2018   Thrombocytopenia (HCC) 12/13/2018   Homeless 12/13/2018   Depression 12/13/2018   Neuropathy 12/13/2018   Subarachnoid hemorrhage (HCC) 12/11/2018   Seizure due to alcohol withdrawal (HCC) 12/11/2018   Alcohol dependence with alcohol-induced mood disorder (HCC)    MDD (major depressive disorder), recurrent severe, 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     No family history on file.  Social History   Tobacco Use   Smoking status: Every Day    Packs/day: 0.50    Years: 7.00    Pack years: 3.50    Types: Cigarettes   Smokeless tobacco: Never  Vaping Use   Vaping Use: Never used  Substance Use Topics   Alcohol use: Yes    Comment: drinks a fifth of vodka daily   Drug use: Not Currently    Types: Marijuana    Home Medications Prior to Admission medications   Medication Sig Start Date End Date Taking? Authorizing Provider  gabapentin (NEURONTIN) 300 MG capsule Take 2 capsules (600 mg total) by mouth 3 (three) times daily. 10/15/20   Money, [name], FNP  hydrOXYzine (ATARAX/VISTARIL) 25 MG tablet Take 1 tablet (25 mg total) by mouth every 6 (six) hours as needed for anxiety. 10/15/20   Money, [name], FNP  pantoprazole (PROTONIX) 40 MG tablet Take 1 tablet (40 mg  total) by mouth daily. 10/15/20   Money, [name], FNP  DULoxetine (CYMBALTA) 30 MG capsule Take 1 capsule (30 mg total) by mouth daily. Patient not taking: Reported on 09/01/2020 06/11/20 09/02/20  Money, [name], FNP  metoCLOPramide (REGLAN) 10 MG tablet Take 1 tablet (10 mg total) by mouth every 6 (six) hours as needed for nausea (nausea/headache). 04/06/19 05/24/19  Mesner, Barbara Cower, MD    Allergies    Cephalexin, Cephalosporins, Ketorolac, Prednisone, and Tramadol  Review of Systems   Review of Systems  Constitutional:  Negative for chills and fever.  HENT:  Negative for nosebleeds, sore throat and tinnitus.   Respiratory:  Negative for chest tightness and shortness of breath.   Cardiovascular:  Negative for chest pain.  Gastrointestinal:  Positive for abdominal pain, diarrhea, nausea and vomiting. Negative for blood in stool.  Genitourinary:  Negative for dysuria and hematuria.  Musculoskeletal:  Positive for arthralgias, back pain and neck pain.  Neurological:  Positive for dizziness and syncope. Negative for seizures and light-headedness.  Psychiatric/Behavioral:  Negative for confusion and hallucinations.    Physical Exam Updated Vital Signs BP (!) 141/71 (BP Location: Right Arm)   Pulse 77   Temp 98.1 F (36.7 C) (Oral)   Resp 18   Ht 5\' 1"  (1.549 m)   Wt 56.7 kg   SpO2 97%   BMI 23.62 kg/m   Physical Exam Constitutional:      General: She is not in acute distress.    Appearance: Normal appearance.  HENT:     Head: Normocephalic and atraumatic.  Eyes:     Extraocular Movements: Extraocular movements intact.     Pupils: Pupils are equal, round, and reactive to light.  Cardiovascular:     Rate and Rhythm: Normal rate and regular rhythm.     Pulses: Normal pulses.     Heart sounds: Normal heart sounds. No murmur heard. Pulmonary:     Effort: Pulmonary effort is normal. No respiratory distress.     Breath sounds: Normal breath sounds. No wheezing, rhonchi or rales.   Abdominal:     General: Bowel sounds are normal. There is no distension.     Palpations: Abdomen is soft.     Tenderness: There is abdominal tenderness.     Comments: Mild LLQ tenderness to palpation   Musculoskeletal:        General: No deformity.     Right lower leg: No edema.     Left lower leg: No edema.  Skin:    General: Skin is warm and dry.  Neurological:     Mental Status: She is alert and oriented to person, place, and time.     Cranial Nerves: No cranial nerve deficit.     Sensory: No sensory deficit.     Motor: Weakness present.  Comments: Bilateral 3/5 strength lower extremities (unable to hold legs up)  Psychiatric:        Mood and Affect: Mood normal.        Behavior: Behavior normal.        Thought Content: Thought content normal.    ED Results / Procedures / Treatments   Labs (all labs ordered are listed, but only abnormal results are displayed) Labs Reviewed  COMPREHENSIVE METABOLIC PANEL - Abnormal; Notable for the following components:      Result Value   CO2 20 (*)    BUN <5 (*)    AST 129 (*)    ALT 62 (*)    All other components within normal limits  SALICYLATE LEVEL - Abnormal; Notable for the following components:   Salicylate Lvl <7.0 (*)    All other components within normal limits  ACETAMINOPHEN LEVEL - Abnormal; Notable for the following components:   Acetaminophen (Tylenol), Serum <10 (*)    All other components within normal limits  ETHANOL - Abnormal; Notable for the following components:   Alcohol, Ethyl (B) 304 (*)    All other components within normal limits  RAPID URINE DRUG SCREEN, HOSP PERFORMED  CBC WITH DIFFERENTIAL/PLATELET  CBG MONITORING, ED    EKG None  Radiology CT Head Wo Contrast  Result Date: 05/10/2021 CLINICAL DATA:  Syncope, simple, normal neuro exam EXAM: CT HEAD WITHOUT CONTRAST TECHNIQUE: Contiguous axial images were obtained from the base of the skull through the vertex without intravenous contrast.  COMPARISON:  05/08/2019 FINDINGS: Brain: No evidence for acute hemorrhage, mass lesion, midline shift, hydrocephalus or large infarct. Stable cerebral atrophy. Mild low-density in the white matter is similar to the previous examination. Vascular: No hyperdense vessel or unexpected calcification. Skull: Normal. Negative for fracture or focal lesion. Sinuses/Orbits: New mucosal thickening in the posterior right ethmoid air cells. Other: None. IMPRESSION: 1. No acute intracranial abnormality. 2. Right posterior ethmoid sinus disease. Electronically Signed   By: Richarda Overlie M.D.   On: 05/10/2021 10:32    Procedures Procedures   Medications Ordered in ED Medications  sodium chloride 0.9 % bolus 1,000 mL (1,000 mLs Intravenous New Bag/Given 05/10/21 1154)  LORazepam (ATIVAN) tablet 1 mg (1 mg Oral Given 05/10/21 1035)  naproxen (NAPROSYN) tablet 500 mg (500 mg Oral Given 05/10/21 1405)    ED Course  I have reviewed the triage vital signs and the nursing notes.  Pertinent labs & imaging results that were available during my care of the patient were reviewed by me and considered in my medical decision making (see chart for details).    MDM Rules/Calculators/A&P                           Patient is a 64 yo female presenting with multiple complaints- dizziness, chronic low back, neck, and left arm pain, n/v/d associated with alcohol abuse relapse, and abdominal pain. Patient states that she has been drinking about a 12 pack of beer past 6 months and states that yesterday she drank a 12 pack of beer as well as a pint of vodka.  She has a history of alcohol withdrawal seizures as well as hallucinations.  Ethanol level was 304 overnight.  She is not diaphoretic and does not have any tremors now. Patient given fluid bolus and 1mg  ativan. CT head ordered and showed no acute intracranial abnormality. Patient states that she does not wish to quit drinking at this time. Consult to  TTS placed for worsening  depression, alcohol abuse, and homelessness and patient was given [facility]. Patient stable to be discharged.   Final Clinical Impression(s) / ED Diagnoses Final diagnoses:  ETOH abuse  Chronic low back pain, unspecified back pain laterality, unspecified whether sciatica present    Rx / DC Orders ED Discharge Orders     None        Chauncey Manntway, Benton Tooker N, DO 05/10/21 1421    Tegeler, Canary Brimhristopher J, MD 05/11/21 1002

## 2021-05-10 NOTE — ED Notes (Signed)
Reviewed discharge instructions with patient. Follow-up care and medications reviewed. Patient  verbalized understanding. Patient A&Ox4, VSS, and ambulatory with steady gait upon discharge.  °

## 2021-07-20 ENCOUNTER — Emergency Department (HOSPITAL_COMMUNITY)
Admission: EM | Admit: 2021-07-20 | Discharge: 2021-07-21 | Disposition: A | Discharge status: Home / Self Care | Payer: Medicare Other | Attending: Emergency Medicine | Admitting: Emergency Medicine

## 2021-07-20 ENCOUNTER — Encounter (HOSPITAL_COMMUNITY): Payer: Self-pay

## 2021-07-20 ENCOUNTER — Other Ambulatory Visit: Payer: Self-pay

## 2021-07-20 ENCOUNTER — Emergency Department (HOSPITAL_COMMUNITY): Payer: Medicare Other

## 2021-07-20 DIAGNOSIS — R10817 Generalized abdominal tenderness: Secondary | ICD-10-CM | POA: Insufficient documentation

## 2021-07-20 DIAGNOSIS — F17210 Nicotine dependence, cigarettes, uncomplicated: Secondary | ICD-10-CM | POA: Diagnosis not present

## 2021-07-20 DIAGNOSIS — R1110 Vomiting, unspecified: Secondary | ICD-10-CM

## 2021-07-20 DIAGNOSIS — M7989 Other specified soft tissue disorders: Secondary | ICD-10-CM | POA: Diagnosis present

## 2021-07-20 DIAGNOSIS — R600 Localized edema: Secondary | ICD-10-CM | POA: Diagnosis not present

## 2021-07-20 DIAGNOSIS — I872 Venous insufficiency (chronic) (peripheral): Secondary | ICD-10-CM | POA: Diagnosis not present

## 2021-07-20 DIAGNOSIS — E876 Hypokalemia: Secondary | ICD-10-CM | POA: Diagnosis not present

## 2021-07-20 LAB — COMPREHENSIVE METABOLIC PANEL
ALT: 41 U/L (ref 0–44)
AST: 63 U/L — ABNORMAL HIGH (ref 15–41)
Albumin: 4 g/dL (ref 3.5–5.0)
Alkaline Phosphatase: 82 U/L (ref 38–126)
Anion gap: 13 (ref 5–15)
BUN: 7 mg/dL — ABNORMAL LOW (ref 8–23)
CO2: 26 mmol/L (ref 22–32)
Calcium: 9.7 mg/dL (ref 8.9–10.3)
Chloride: 93 mmol/L — ABNORMAL LOW (ref 98–111)
Creatinine, Ser: 0.44 mg/dL (ref 0.44–1.00)
GFR, Estimated: >60 mL/min (ref >60)
Glucose, Bld: 93 mg/dL (ref 70–99)
Potassium: 2.8 mmol/L — ABNORMAL LOW (ref 3.5–5.1)
Sodium: 132 mmol/L — ABNORMAL LOW (ref 135–145)
Total Bilirubin: 1 mg/dL (ref 0.3–1.2)
Total Protein: 7.4 g/dL (ref 6.5–8.1)

## 2021-07-20 LAB — CBC WITH DIFFERENTIAL/PLATELET
Abs Immature Granulocytes: 0.01 10*3/uL (ref 0.00–0.07)
Basophils Absolute: 0 10*3/uL (ref 0.0–0.1)
Basophils Relative: 1 %
Eosinophils Absolute: 0 10*3/uL (ref 0.0–0.5)
Eosinophils Relative: 1 %
HCT: 39.9 % (ref 36.0–46.0)
Hemoglobin: 13.9 g/dL (ref 12.0–15.0)
Immature Granulocytes: 0 %
Lymphocytes Relative: 29 %
Lymphs Abs: 1.4 10*3/uL (ref 0.7–4.0)
MCH: 34.4 pg — ABNORMAL HIGH (ref 26.0–34.0)
MCHC: 34.8 g/dL (ref 30.0–36.0)
MCV: 98.8 fL (ref 80.0–100.0)
Monocytes Absolute: 0.5 10*3/uL (ref 0.1–1.0)
Monocytes Relative: 10 %
Neutro Abs: 3 10*3/uL (ref 1.7–7.7)
Neutrophils Relative %: 59 %
Platelets: 144 10*3/uL — ABNORMAL LOW (ref 150–400)
RBC: 4.04 MIL/uL (ref 3.87–5.11)
RDW: 13.7 % (ref 11.5–15.5)
WBC: 5 10*3/uL (ref 4.0–10.5)
nRBC: 0 % (ref 0.0–0.2)

## 2021-07-20 LAB — BRAIN NATRIURETIC PEPTIDE: B Natriuretic Peptide: 128.7 pg/mL — ABNORMAL HIGH (ref 0.0–100.0)

## 2021-07-20 NOTE — ED Triage Notes (Signed)
Per EMS- Patient was picked up at a bus stop on [address]. Patient reports that she has been unable to walk due to swelling of her legs and feet. L>R EMS reports that they were unable to feel a pedal pulse on the left, but pedal pulse present to the right foot. Patient reports swelling x 2 weeks.

## 2021-07-20 NOTE — ED Provider Notes (Signed)
Emergency Medicine Provider Triage Evaluation Note  [name] , a 65 y.o. female  was evaluated in triage.  Pt complains of bilateral leg pain, L>R. Pt has chronic pain and swelling, worse in the past 3 days. Reports color change of the feet bilaterally. Chronic abd pain. No fever, cp, sob, cough.   Review of Systems  Positive: Bilat foot pain Negative: fever  Physical Exam  BP 133/86   Pulse 97   Temp 97.7 F (36.5 C) (Oral)   Resp 18   Ht 5\' 1"  (1.549 m)   Wt 59 kg   SpO2 100%   BMI 24.56 kg/m  Gen:   Awake, no distress   Resp:  Normal effort  MSK:   Swelling of bilateral feet and lower legs. Feet ara darker colored, diffuse tender with slow cap refill. Feet are cold bilaterally.  Pedal pulses present bilaterally    Medical Decision Making  Medically screening exam initiated at 7:51 PM.  Appropriate orders placed.  [name] was informed that the remainder of the evaluation will be completed by another provider, this initial triage assessment does not replace that evaluation, and the importance of remaining in the ED until their evaluation is complete.  Labs, cxr   Emily Flake, PA-C 07/20/21 2202    2203, MD 07/20/21 2342

## 2021-07-21 ENCOUNTER — Encounter (HOSPITAL_COMMUNITY): Payer: Self-pay

## 2021-07-21 ENCOUNTER — Emergency Department (HOSPITAL_COMMUNITY): Payer: Medicare Other

## 2021-07-21 DIAGNOSIS — I872 Venous insufficiency (chronic) (peripheral): Secondary | ICD-10-CM | POA: Diagnosis not present

## 2021-07-21 LAB — URINALYSIS, ROUTINE W REFLEX MICROSCOPIC
Bilirubin Urine: NEGATIVE
Glucose, UA: NEGATIVE mg/dL
Hgb urine dipstick: NEGATIVE
Ketones, ur: NEGATIVE mg/dL
Leukocytes,Ua: NEGATIVE
Nitrite: NEGATIVE
Protein, ur: NEGATIVE mg/dL
Specific Gravity, Urine: 1 — ABNORMAL LOW (ref 1.005–1.030)
pH: 7 (ref 5.0–8.0)

## 2021-07-21 LAB — CBG MONITORING, ED: Glucose-Capillary: 95 mg/dL (ref 70–99)

## 2021-07-21 LAB — MAGNESIUM: Magnesium: 1.7 mg/dL (ref 1.7–2.4)

## 2021-07-21 LAB — LIPASE, BLOOD: Lipase: 27 U/L (ref 11–51)

## 2021-07-21 MED ORDER — FENTANYL CITRATE PF 50 MCG/ML IJ SOSY
50.0000 ug | PREFILLED_SYRINGE | Freq: Once | INTRAMUSCULAR | Status: AC
  Administered 2021-07-21: 50 ug via INTRAVENOUS
  Filled 2021-07-21: qty 1

## 2021-07-21 MED ORDER — ONDANSETRON HCL 4 MG/2ML IJ SOLN
4.0000 mg | Freq: Once | INTRAMUSCULAR | Status: AC
  Administered 2021-07-21: 4 mg via INTRAVENOUS
  Filled 2021-07-21: qty 2

## 2021-07-21 MED ORDER — IOHEXOL 350 MG/ML SOLN
80.0000 mL | Freq: Once | INTRAVENOUS | Status: AC | PRN
  Administered 2021-07-21: 80 mL via INTRAVENOUS

## 2021-07-21 MED ORDER — ONDANSETRON 4 MG PO TBDP
4.0000 mg | ORAL_TABLET | Freq: Once | ORAL | Status: AC
  Administered 2021-07-21: 4 mg via ORAL
  Filled 2021-07-21: qty 1

## 2021-07-21 MED ORDER — HYDROCODONE-ACETAMINOPHEN 5-325 MG PO TABS
1.0000 | ORAL_TABLET | Freq: Once | ORAL | Status: AC
  Administered 2021-07-21: 1 via ORAL
  Filled 2021-07-21: qty 1

## 2021-07-21 MED ORDER — POTASSIUM CHLORIDE CRYS ER 20 MEQ PO TBCR
40.0000 meq | EXTENDED_RELEASE_TABLET | Freq: Once | ORAL | Status: AC
  Administered 2021-07-21: 40 meq via ORAL
  Filled 2021-07-21: qty 2

## 2021-07-21 MED ORDER — HYDROCODONE-ACETAMINOPHEN 5-325 MG PO TABS: 1.0000 | ORAL_TABLET | ORAL | 0 refills | Status: DC | PRN

## 2021-07-21 MED ORDER — POTASSIUM CHLORIDE 10 MEQ/100ML IV SOLN
10.0000 meq | Freq: Once | INTRAVENOUS | Status: AC
  Administered 2021-07-21: 10 meq via INTRAVENOUS
  Filled 2021-07-21: qty 100

## 2021-07-21 MED ORDER — GABAPENTIN 300 MG PO CAPS: 600.0000 mg | ORAL_CAPSULE | Freq: Three times a day (TID) | ORAL | 0 refills | Status: DC

## 2021-07-21 MED ORDER — POTASSIUM CHLORIDE CRYS ER 20 MEQ PO TBCR: 20.0000 meq | EXTENDED_RELEASE_TABLET | Freq: Two times a day (BID) | ORAL | 0 refills | Status: DC

## 2021-07-21 MED ORDER — ONDANSETRON 4 MG PO TBDP: 4.0000 mg | ORAL_TABLET | Freq: Three times a day (TID) | ORAL | 0 refills | Status: DC | PRN

## 2021-07-21 NOTE — Discharge Instructions (Addendum)
Your potassium is low so you will need to take the potassium supplements prescribed.  You are also being prescribed narcotic pain medicine.  Do not take this in combination with alcohol or other medicines/drugs.  If you develop new or worsening abdominal pain, fever, uncontrolled vomiting, new or worsening leg pain or swelling, chest pain, shortness of breath, or any other new/concerning symptoms then return to the ER for evaluation.

## 2021-07-21 NOTE — ED Provider Notes (Signed)
[facility]-EMERGENCY DEPT Provider Note   CSN: [id number] Arrival date & time: 07/20/21  1826     History Chief Complaint  Patient presents with   Leg Swelling    [name] is a 65 y.o. female.  HPI 65 year old female presents with a chief complaint of leg swelling.  This is been ongoing for the last couple weeks but worse over the last few days.  Is gotten to the point that is so painful she can barely walk without holding onto something.  She does endorse some vomiting over the last few days as well as some upper abdominal pain.  She has chronic alcohol abuse.  She denies chest pain, shortness of breath.  No fevers.  She has tried [place] powder but is not helping.  When she vomited it was yellow but nonbloody.  Past Medical History:  Diagnosis Date   Alcohol dependence (HCC)    Anxiety    Chronic pain    Depression    Diverticulitis    Herniated cervical disc    Pancreatitis    Seizures (HCC)    alcoholic seizures    Patient Active Problem List   Diagnosis Date Noted   Alcohol-induced mood disorder (HCC)    Closed displaced oblique fracture of shaft of left humerus 08/01/2020   Diverticulitis 02/29/2020   Acute diverticulitis 02/28/2020   GERD (gastroesophageal reflux disease) 02/28/2020   Suicidal ideation    Delirium tremens (HCC) 12/13/2018   Thrombocytopenia (HCC) 12/13/2018   Homeless 12/13/2018   Depression 12/13/2018   Neuropathy 12/13/2018   Subarachnoid hemorrhage (HCC) 12/11/2018   Seizure due to alcohol withdrawal (HCC) 12/11/2018   Alcohol dependence with alcohol-induced mood disorder (HCC)    MDD (major depressive disorder), recurrent severe, 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     History reviewed. No pertinent family history.  Social History   Tobacco Use   Smoking status: Every Day    Packs/day: 0.25    Years: 7.00    Pack years: 1.75    Types: Cigarettes   Smokeless tobacco: Never  Vaping Use   Vaping Use: Never used  Substance Use Topics   Alcohol use: Yes    Comment: drinks a fifth of vodka daily   Drug use: Not Currently    Types: Marijuana    Home Medications Prior to Admission medications   Medication Sig Start Date End Date Taking? Authorizing Provider  HYDROcodone-acetaminophen (NORCO) 5-325 MG tablet Take 1 tablet by mouth every 4 (four) hours as needed. 07/21/21  Yes Pricilla Loveless, MD  loperamide (IMODIUM) 1 MG/5ML solution Take 1 mg by mouth daily as needed for diarrhea or loose stools.   Yes [provider]  ondansetron (ZOFRAN ODT) 4 MG disintegrating tablet Take 1 tablet (4 mg total) by mouth every 8 (eight) hours as needed for nausea or vomiting. 07/21/21  Yes Pricilla Loveless, MD  potassium chloride SA (KLOR-CON) 20 MEQ tablet Take 1 tablet (20 mEq total) by mouth 2 (two) times daily for 5 days. 07/21/21 07/26/21 Yes  Pricilla Loveless, MD  gabapentin (NEURONTIN) 300 MG capsule Take 2 capsules (600 mg total) by mouth 3 (three) times daily. 07/21/21 08/20/21  Pricilla Loveless, MD  hydrOXYzine (ATARAX/VISTARIL) 25 MG tablet Take 1 tablet (25 mg total) by mouth every 6 (six) hours as needed for anxiety. Patient not taking: Reported on 07/20/2021 10/15/20   Money, [name], FNP  pantoprazole (PROTONIX) 40 MG tablet Take 1 tablet (40 mg total) by mouth daily. Patient not taking: Reported on 07/20/2021 10/15/20   Money, [name], FNP  DULoxetine (CYMBALTA) 30 MG capsule Take 1 capsule (30 mg total) by mouth daily. Patient not taking: Reported on 09/01/2020 06/11/20 09/02/20  Money, [name],  FNP  metoCLOPramide (REGLAN) 10 MG tablet Take 1 tablet (10 mg total) by mouth every 6 (six) hours as needed for nausea (nausea/headache). 04/06/19 05/24/19  Mesner, Barbara Cower, MD    Allergies    Cephalexin, Cephalosporins, Ketorolac, Prednisone, and Tramadol  Review of Systems   Review of Systems  Constitutional:  Negative for fever.  Respiratory:  Negative for shortness of breath.   Cardiovascular:  Positive for leg swelling. Negative for chest pain.  Gastrointestinal:  Positive for abdominal pain, nausea and vomiting.  All other systems reviewed and are negative.  Physical Exam Updated Vital Signs BP 106/80 (BP Location: Right Arm)   Pulse 80   Temp 98 F (36.7 C) (Oral)   Resp 16   Ht 5\' 1"  (1.549 m)   Wt 59 kg   SpO2 99%   BMI 24.56 kg/m   Physical Exam Vitals and nursing note reviewed.  Constitutional:      General: She is not in acute distress.    Appearance: She is well-developed.  HENT:     Head: Normocephalic and atraumatic.     Right Ear: External ear normal.     Left Ear: External ear normal.     Nose: Nose normal.  Eyes:     General:        Right eye: No discharge.        Left eye: No discharge.  Cardiovascular:     Rate and Rhythm: Normal rate and regular rhythm.     Pulses:          Dorsalis pedis pulses are 2+ on the right side and 2+ on the left side.     Heart sounds: Normal heart sounds.  Pulmonary:     Effort: Pulmonary effort is normal.     Breath sounds: Normal breath sounds. No wheezing, rhonchi or rales.  Abdominal:     Palpations: Abdomen is soft.     Tenderness: There is abdominal tenderness (generalized, worst in upper abdomen).  Musculoskeletal:     Comments: Bilateral feet and lower legs are edematous with some pitting edema, most prominent in the feet.  Mild tenderness.  They are shiny and red but not hot.  Most consistent with stasis dermatitis.  Skin:    General: Skin is warm and dry.  Neurological:     Mental Status: She is alert.   Psychiatric:        Mood and Affect: Mood is not anxious.    ED Results / Procedures / Treatments   Labs (all labs ordered are listed, but only abnormal results are displayed) Labs Reviewed  CBC WITH DIFFERENTIAL/PLATELET - Abnormal; Notable for the following components:      Result Value   MCH 34.4 (*)    Platelets 144 (*)    All other components within normal  limits  COMPREHENSIVE METABOLIC PANEL - Abnormal; Notable for the following components:   Sodium 132 (*)    Potassium 2.8 (*)    Chloride 93 (*)    BUN 7 (*)    AST 63 (*)    All other components within normal limits  BRAIN NATRIURETIC PEPTIDE - Abnormal; Notable for the following components:   B Natriuretic Peptide 128.7 (*)    All other components within normal limits  URINALYSIS, ROUTINE W REFLEX MICROSCOPIC - Abnormal; Notable for the following components:   APPearance HAZY (*)    Specific Gravity, Urine 1.000 (*)    All other components within normal limits  MAGNESIUM  LIPASE, BLOOD  CBG MONITORING, ED    EKG EKG Interpretation  Date/Time:  Monday July 21 2021 00:29:14 EDT Ventricular Rate:  89 PR Interval:  158 QRS Duration: 90 QT Interval:  386 QTC Calculation: 470 R Axis:   -51 Text Interpretation: Sinus rhythm Probable left atrial enlargement Left anterior fascicular block Abnormal R-wave progression, late transition similar to 2021 Confirmed by Pricilla Loveless [phone]) on 07/21/2021 1:04:53 AM  Radiology DG Chest 2 View  Result Date: 07/20/2021 CLINICAL DATA:  Leg swelling. EXAM: CHEST - 2 VIEW COMPARISON:  Chest x-ray 02/28/2020. FINDINGS: The heart size and mediastinal contours are within normal limits. Both lungs are clear. Cervical spinal fusion plate is visualized. IMPRESSION: No active cardiopulmonary disease. Electronically Signed   By: Darliss Cheney M.D.   On: 07/20/2021 21:23   CT ABDOMEN PELVIS W CONTRAST  Result Date: 07/21/2021 CLINICAL DATA:  Unable to walk due to swelling of her  legs and feet. EXAM: CT ABDOMEN AND PELVIS WITH CONTRAST TECHNIQUE: Multidetector CT imaging of the abdomen and pelvis was performed using the standard protocol following bolus administration of intravenous contrast. CONTRAST:  86mL OMNIPAQUE IOHEXOL 350 MG/ML SOLN COMPARISON:  September 01, 2020 FINDINGS: Lower chest: No acute abnormality. Hepatobiliary: There is diffuse fatty infiltration of the liver parenchyma. No focal liver abnormality is seen. Status post cholecystectomy. No biliary dilatation. Pancreas: Unremarkable. No pancreatic ductal dilatation or surrounding inflammatory changes. Spleen: Normal in size without focal abnormality. Adrenals/Urinary Tract: Adrenal glands are unremarkable. Kidneys are normal, without renal calculi or hydronephrosis. A 5 mm focus of parenchymal low attenuation is seen within the anterior aspect of the upper pole of the left kidney. This is too small to characterize by CT examination. The urinary bladder is partially contracted. Mild, diffuse urinary bladder wall thickening is seen Stomach/Bowel: Stomach is within normal limits. Appendix appears normal. No evidence of bowel wall thickening, distention, or inflammatory changes. Noninflamed diverticula are seen throughout the descending and sigmoid colon. Vascular/Lymphatic: No significant vascular findings are present. No enlarged abdominal or pelvic lymph nodes. Reproductive: Status post hysterectomy. No adnexal masses. Other: No abdominal wall hernia or abnormality. No abdominopelvic ascites. Musculoskeletal: No acute or significant osseous findings. IMPRESSION: 1. Mild, diffuse urinary bladder wall thickening, which may be, in part secondary to the partially contracted nature of the urinary bladder. Sequelae associated with mild cystitis can not be excluded. Correlation with urinalysis is recommended. 2. Hepatic steatosis. 3. Colonic diverticulosis. 4. Evidence of prior cholecystectomy and hysterectomy. Electronically Signed    By: Aram Candela M.D.   On: 07/21/2021 03:05    Procedures Procedures   Medications Ordered in ED Medications  fentaNYL (SUBLIMAZE) injection 50 mcg (50 mcg Intravenous Given 07/21/21 0206)  ondansetron (ZOFRAN) injection 4 mg (4 mg Intravenous Given 07/21/21 0206)  potassium chloride SA (KLOR-CON) CR tablet 40  mEq (40 mEq Oral Given 07/21/21 0207)  potassium chloride 10 mEq in 100 mL IVPB (0 mEq Intravenous Stopped 07/21/21 0405)  iohexol (OMNIPAQUE) 350 MG/ML injection 80 mL (80 mLs Intravenous Contrast Given 07/21/21 0242)  HYDROcodone-acetaminophen (NORCO/VICODIN) 5-325 MG per tablet 1 tablet (1 tablet Oral Given 07/21/21 0532)  ondansetron (ZOFRAN-ODT) disintegrating tablet 4 mg (4 mg Oral Given 07/21/21 0532)    ED Course  I have reviewed the triage vital signs and the nursing notes.  Pertinent labs & imaging results that were available during my care of the patient were reviewed by me and considered in my medical decision making (see chart for details).    MDM Rules/Calculators/A&P                           Patient has not had any vomiting in the emergency department.  She has had intermittent nausea.  Is able to tolerate p.o.  CT is unremarkable and urine does not show UTI.  Labs do show hypokalemia.  QTC is under 500.  Given p.o. and IV potassium and will discharge on p.o. potassium.  Her leg swelling is likely stasis dermatitis.  Highly doubt bilateral DVT.  Not consistent with CHF, especially with no dyspnea.  She was given compression stockings.  She reports that her pain is so bad she cannot walk but after some pain control here she is able to ambulate to the bathroom on her own.  We will give a short course of pain meds.  She is also requesting a refill of her gabapentin.  She otherwise appears stable for discharge home. Final Clinical Impression(s) / ED Diagnoses Final diagnoses:  Stasis dermatitis of both legs  Hypokalemia  Vomiting in adult    Rx / DC Orders ED  Discharge Orders          Ordered    HYDROcodone-acetaminophen (NORCO) 5-325 MG tablet  Every 4 hours PRN        07/21/21 0621    gabapentin (NEURONTIN) 300 MG capsule  3 times daily        07/21/21 0621    ondansetron (ZOFRAN ODT) 4 MG disintegrating tablet  Every 8 hours PRN        07/21/21 0621    potassium chloride SA (KLOR-CON) 20 MEQ tablet  2 times daily        07/21/21 2355             Pricilla Loveless, MD 07/21/21 [phone]

## 2021-07-28 ENCOUNTER — Emergency Department (HOSPITAL_COMMUNITY)
Admission: EM | Admit: 2021-07-28 | Discharge: 2021-07-29 | Disposition: A | Discharge status: Home / Self Care | Payer: Medicare Other | Attending: Emergency Medicine | Admitting: Emergency Medicine

## 2021-07-28 ENCOUNTER — Encounter (HOSPITAL_COMMUNITY): Payer: Self-pay

## 2021-07-28 ENCOUNTER — Other Ambulatory Visit: Payer: Self-pay

## 2021-07-28 DIAGNOSIS — G629 Polyneuropathy, unspecified: Secondary | ICD-10-CM | POA: Insufficient documentation

## 2021-07-28 DIAGNOSIS — W19XXXA Unspecified fall, initial encounter: Secondary | ICD-10-CM | POA: Diagnosis not present

## 2021-07-28 DIAGNOSIS — F17210 Nicotine dependence, cigarettes, uncomplicated: Secondary | ICD-10-CM | POA: Diagnosis not present

## 2021-07-28 DIAGNOSIS — R600 Localized edema: Secondary | ICD-10-CM | POA: Diagnosis not present

## 2021-07-28 DIAGNOSIS — R609 Edema, unspecified: Secondary | ICD-10-CM

## 2021-07-28 LAB — CBC WITH DIFFERENTIAL/PLATELET
Abs Immature Granulocytes: 0.03 10*3/uL (ref 0.00–0.07)
Basophils Absolute: 0.1 10*3/uL (ref 0.0–0.1)
Basophils Relative: 1 %
Eosinophils Absolute: 0.1 10*3/uL (ref 0.0–0.5)
Eosinophils Relative: 3 %
HCT: 41.3 % (ref 36.0–46.0)
Hemoglobin: 12.8 g/dL (ref 12.0–15.0)
Immature Granulocytes: 1 %
Lymphocytes Relative: 32 %
Lymphs Abs: 1.6 10*3/uL (ref 0.7–4.0)
MCH: 33.9 pg (ref 26.0–34.0)
MCHC: 31 g/dL (ref 30.0–36.0)
MCV: 109.3 fL — ABNORMAL HIGH (ref 80.0–100.0)
Monocytes Absolute: 0.4 10*3/uL (ref 0.1–1.0)
Monocytes Relative: 9 %
Neutro Abs: 2.7 10*3/uL (ref 1.7–7.7)
Neutrophils Relative %: 54 %
Platelets: 459 10*3/uL — ABNORMAL HIGH (ref 150–400)
RBC: 3.78 MIL/uL — ABNORMAL LOW (ref 3.87–5.11)
RDW: 14.3 % (ref 11.5–15.5)
WBC: 4.9 10*3/uL (ref 4.0–10.5)
nRBC: 0 % (ref 0.0–0.2)

## 2021-07-28 LAB — BASIC METABOLIC PANEL
Anion gap: 8 (ref 5–15)
BUN: 8 mg/dL (ref 8–23)
CO2: 24 mmol/L (ref 22–32)
Calcium: 8.9 mg/dL (ref 8.9–10.3)
Chloride: 105 mmol/L (ref 98–111)
Creatinine, Ser: 0.62 mg/dL (ref 0.44–1.00)
GFR, Estimated: >60 mL/min (ref >60)
Glucose, Bld: 67 mg/dL — ABNORMAL LOW (ref 70–99)
Potassium: 3.6 mmol/L (ref 3.5–5.1)
Sodium: 137 mmol/L (ref 135–145)

## 2021-07-28 NOTE — ED Provider Notes (Signed)
Emergency Medicine Provider Triage Evaluation Note  [name] , a 65 y.o. female  was evaluated in triage.  Pt complains of bilateral lower extremity pain.  She states that she has sharp, stabbing, aching pain in both lower extremities.  Endorses associated nausea without vomiting.  Denies fevers.  She was seen on 07/20/2021 for same and was discharged with stasis dermatitis and prescribed pain medication and Zofran.  Since then she states that the pain has moved up both her legs to her knees at this point.  She is having difficulty walking around house and endorses fall last week.  She denies hitting her head or LOC.  Review of Systems  Positive: Bilateral lower extremity swelling Negative: Fevers  Physical Exam  BP 125/82 (BP Location: Left Arm)   Pulse 65   Temp 98.2 F (36.8 C) (Oral)   Resp 16   Ht 5\' 1"  (1.549 m)   Wt 60.8 kg   SpO2 100%   BMI 25.32 kg/m  Gen:   Awake, no distress   Resp:  Normal effort  MSK:   Limited range of motion of bilateral ankles and knees due to swelling and pain Other:  Bilateral lower extremities with 2+ pitting edema.  Pulses equal bilaterally.  Swelling appears equal bilaterally.  There is redness overlying the skin.  No open ulcers or sores.  Medical Decision Making  Medically screening exam initiated at 10:21 AM.  Appropriate orders placed.  Tiasha Helvie was informed that the remainder of the evaluation will be completed by another provider, this initial triage assessment does not replace that evaluation, and the importance of remaining in the ED until their evaluation is complete.     [name], PA-C 07/28/21 1023    09/27/21, MD 07/28/21 [phone]

## 2021-07-28 NOTE — ED Triage Notes (Signed)
Patient c/o bilateral leg swelling L>R. Patient has redness from the calf down. Patient was seen approx 3 weeks ago for the same. Patient c/o increased difficulty walking.  Patient also reports that she had a fall 2 days ago, but did not have LOC or hit her head.

## 2021-07-29 DIAGNOSIS — R600 Localized edema: Secondary | ICD-10-CM | POA: Diagnosis not present

## 2021-07-29 LAB — CBG MONITORING, ED: Glucose-Capillary: 87 mg/dL (ref 70–99)

## 2021-07-29 MED ORDER — FUROSEMIDE 20 MG PO TABS
10.0000 mg | ORAL_TABLET | Freq: Once | ORAL | Status: AC
  Administered 2021-07-29: 10 mg via ORAL
  Filled 2021-07-29: qty 0.5

## 2021-07-29 MED ORDER — POTASSIUM CHLORIDE CRYS ER 20 MEQ PO TBCR: 20.0000 meq | EXTENDED_RELEASE_TABLET | Freq: Two times a day (BID) | ORAL | 0 refills | Status: DC

## 2021-07-29 MED ORDER — PANTOPRAZOLE SODIUM 40 MG PO TBEC: 40.0000 mg | DELAYED_RELEASE_TABLET | Freq: Every day | ORAL | 0 refills | Status: DC

## 2021-07-29 MED ORDER — METOCLOPRAMIDE HCL 10 MG PO TABS
5.0000 mg | ORAL_TABLET | Freq: Once | ORAL | Status: AC
  Administered 2021-07-29: 5 mg via ORAL
  Filled 2021-07-29: qty 1

## 2021-07-29 MED ORDER — FUROSEMIDE 20 MG PO TABS: 20.0000 mg | ORAL_TABLET | Freq: Every day | ORAL | 0 refills | Status: DC

## 2021-07-29 NOTE — ED Provider Notes (Signed)
[facility]-EMERGENCY DEPT Provider Note  CSN: [id number] Arrival date & time: 07/28/21 4259  Chief Complaint(s) Leg Swelling and Fall  HPI [name] is a 65 y.o. female    Fall This is a recurrent problem. The current episode started 2 days ago. Pertinent negatives include no chest pain, no abdominal pain, no headaches and no shortness of breath. The symptoms are aggravated by walking. She has tried nothing for the symptoms.   Falls are related to her BLE neuropathy and new edema. States she get sharp shooting pain up from her feet from time to time that cause her to fall. She was seen last week for the same and given Norco for pain which she ran out of.  Work-up for heart failure was negative.  Fall did not result in head trauma. No injuries from fall.  Past Medical History Past Medical History:  Diagnosis Date   Alcohol dependence (HCC)    Anxiety    Chronic pain    Depression    Diverticulitis    Herniated cervical disc    Pancreatitis    Seizures (HCC)    alcoholic seizures   Patient Active Problem List   Diagnosis Date Noted   Alcohol-induced mood disorder (HCC)    Closed displaced oblique fracture of shaft of left humerus 08/01/2020   Diverticulitis 02/29/2020   Acute diverticulitis 02/28/2020   GERD (gastroesophageal reflux disease) 02/28/2020   Suicidal ideation    Delirium tremens (HCC) 12/13/2018   Thrombocytopenia (HCC) 12/13/2018   Homeless 12/13/2018   Depression 12/13/2018   Neuropathy 12/13/2018   Subarachnoid hemorrhage (HCC) 12/11/2018   Seizure due to alcohol withdrawal (HCC) 12/11/2018   Alcohol dependence with alcohol-induced mood disorder (HCC)    MDD (major depressive disorder), recurrent severe, 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 Authorizing Provider  furosemide (LASIX) 20 MG tablet Take 1 tablet (20 mg total) by mouth daily for 10 days. 07/29/21 08/08/21 Yes Andelyn Spade, Amadeo Garnet, MD  gabapentin (NEURONTIN) 300 MG capsule Take 2 capsules (600 mg total) by mouth 3 (three) times daily. 07/21/21 08/20/21  Pricilla Loveless, MD  HYDROcodone-acetaminophen (NORCO) 5-325 MG tablet Take 1 tablet by mouth every 4 (four) hours as needed. 07/21/21   Pricilla Loveless, MD  hydrOXYzine (ATARAX/VISTARIL) 25 MG tablet Take 1 tablet (25 mg total) by mouth every 6 (six) hours as needed for anxiety. Patient not taking: Reported on 07/20/2021 10/15/20   Money, [name], FNP  loperamide (IMODIUM) 1 MG/5ML solution Take 1 mg by mouth daily as needed for diarrhea or loose stools.    [provider]  ondansetron (ZOFRAN ODT) 4 MG disintegrating tablet Take 1 tablet (4 mg total) by mouth every 8 (eight) hours as needed for nausea or vomiting. 07/21/21   Pricilla Loveless, MD  pantoprazole (PROTONIX) 40 MG tablet Take 1 tablet (40 mg total) by mouth daily. 07/29/21   Renell Coaxum, Amadeo Garnet, MD  potassium chloride SA (KLOR-CON) 20 MEQ tablet Take 1 tablet (20 mEq total) by mouth 2 (two) times daily for 10 days. 07/29/21 08/08/21  Nira Conn, MD  DULoxetine (CYMBALTA) 30 MG capsule Take 1 capsule (30 mg total) by mouth daily. Patient not taking: Reported on 09/01/2020 06/11/20 09/02/20  Money, [name], FNP  metoCLOPramide (REGLAN) 10 MG tablet Take  1 tablet (10 mg total) by mouth every 6 (six) hours as needed for nausea (nausea/headache). 04/06/19 05/24/19  Mesner, Barbara Cower, MD                                                                                                                                    Past Surgical History Past Surgical History:  Procedure Laterality Date   ABDOMINAL HYSTERECTOMY     ANKLE SURGERY Right    CARPAL TUNNEL RELEASE Bilateral    CERVICAL  FUSION     CHOLECYSTECTOMY     KNEE SURGERY     Family History History reviewed. No pertinent family history.  Social History Social History   Tobacco Use   Smoking status: Every Day    Packs/day: 0.25    Years: 7.00    Pack years: 1.75    Types: Cigarettes   Smokeless tobacco: Never  Vaping Use   Vaping Use: Never used  Substance Use Topics   Alcohol use: Not Currently    Comment: drinks a fifth of vodka daily   Drug use: Not Currently    Types: Marijuana   Allergies Cephalexin, Cephalosporins, Ketorolac, Prednisone, and Tramadol  Review of Systems Review of Systems  Respiratory:  Negative for shortness of breath.   Cardiovascular:  Negative for chest pain.  Gastrointestinal:  Negative for abdominal pain.  Neurological:  Negative for headaches.  All other systems are reviewed and are negative for acute change except as noted in the HPI  Physical Exam Vital Signs  I have reviewed the triage vital signs BP 135/87   Pulse 66   Temp 98.2 F (36.8 C) (Oral)   Resp 18   Ht 5\' 1"  (1.549 m)   Wt 60.8 kg   SpO2 99%   BMI 25.32 kg/m   Physical Exam Vitals reviewed.  Constitutional:      General: She is not in acute distress.    Appearance: She is well-developed. She is not diaphoretic.  HENT:     Head: Normocephalic and atraumatic.     Right Ear: External ear normal.     Left Ear: External ear normal.     Nose: Nose normal.  Eyes:     General: No scleral icterus.    Conjunctiva/sclera: Conjunctivae normal.  Neck:     Trachea: Phonation normal.  Cardiovascular:     Rate and Rhythm: Normal rate and regular rhythm.  Pulmonary:     Effort: Pulmonary effort is normal. No respiratory distress.     Breath sounds: No stridor.  Abdominal:     General: There is no distension.  Musculoskeletal:        General: Normal range of motion.     Cervical back: Normal range of motion.     Right lower leg: 2+ Pitting Edema present.     Left lower leg: 2+ Pitting Edema  present.  Skin:    Findings: No erythema or  wound.  Neurological:     Mental Status: She is alert and oriented to person, place, and time.  Psychiatric:        Behavior: Behavior normal.    ED Results and Treatments Labs (all labs ordered are listed, but only abnormal results are displayed) Labs Reviewed  BASIC METABOLIC PANEL - Abnormal; Notable for the following components:      Result Value   Glucose, Bld 67 (*)    All other components within normal limits  CBC WITH DIFFERENTIAL/PLATELET - Abnormal; Notable for the following components:   RBC 3.78 (*)    MCV 109.3 (*)    Platelets 459 (*)    All other components within normal limits  CBG MONITORING, ED                                                                                                                         EKG  EKG Interpretation  Date/Time:    Ventricular Rate:    PR Interval:    QRS Duration:   QT Interval:    QTC Calculation:   R Axis:     Text Interpretation:         Radiology No results found.  Pertinent labs & imaging results that were available during my care of the patient were reviewed by me and considered in my medical decision making (see MDM for details).  Medications Ordered in ED Medications  furosemide (LASIX) tablet 10 mg (10 mg Oral Given 07/29/21 0425)  metoCLOPramide (REGLAN) tablet 5 mg (5 mg Oral Given 07/29/21 0426)                                                                                                                                     Procedures Procedures  (including critical care time)  Medical Decision Making / ED Course I have reviewed the nursing notes for this encounter and the patient's prior records (if available in EHR or on provided paperwork).  Kaysey Berndt was evaluated in Emergency Department on 07/29/2021 for the symptoms described in the history of present illness. She was evaluated in the context of the global COVID-19 pandemic, which  necessitated consideration that the patient might be at risk for infection with the SARS-CoV-2 virus that causes COVID-19. Institutional protocols and algorithms that pertain to the evaluation of patients at risk for COVID-19 are in a state of rapid change based  on information released by regulatory bodies including the CDC and federal and state organizations. These policies and algorithms were followed during the patient's care in the ED.     Fall 2 days ago related to bilateral extremity neuropathy and edema. No head trauma loss of consciousness.. No signs of injuries noted on exam requiring imaging or work-up.  Labs reassuring without renal sufficiency.  No anemia.  No significant electrolyte derangements. Patient noted to have mild hypoglycemia during initial labs.  A repeat CBG it was within normal limits.   Pertinent labs & imaging results that were available during my care of the patient were reviewed by me and considered in my medical decision making:  Will prescribe patient a short course of Lasix with an adjunct potassium supplement. Recommend she follow-up with her primary care provider for additional management.  Final Clinical Impression(s) / ED Diagnoses Final diagnoses:  Peripheral edema   The patient appears reasonably screened and/or stabilized for discharge and I doubt any other medical condition or other [facility] requiring further screening, evaluation, or treatment in the ED at this time prior to discharge. Safe for discharge with strict return precautions.  Disposition: Discharge  Condition: Good  I have discussed the results, Dx and Tx plan with the patient/family who expressed understanding and agree(s) with the plan. Discharge instructions discussed at length. The patient/family was given strict return precautions who verbalized understanding of the instructions. No further questions at time of discharge.    ED Discharge Orders          Ordered    furosemide (LASIX)  20 MG tablet  Daily        07/29/21 0403    potassium chloride SA (KLOR-CON) 20 MEQ tablet  2 times daily        07/29/21 0406    pantoprazole (PROTONIX) 40 MG tablet  Daily        07/29/21 0407             Follow Up: [facility] [address] [place] [address] [phone]  Call  to schedule an appointment for close follow up     This chart was dictated using voice recognition software.  Despite best efforts to proofread,  errors can occur which can change the documentation meaning.    Nira Conn, MD 07/29/21 [phone]

## 2021-08-05 ENCOUNTER — Ambulatory Visit: Payer: Medicare Other | Admitting: Orthopaedic Surgery

## 2022-03-04 ENCOUNTER — Other Ambulatory Visit: Payer: Self-pay

## 2022-03-04 ENCOUNTER — Emergency Department (HOSPITAL_COMMUNITY): Payer: 59

## 2022-03-04 ENCOUNTER — Inpatient Hospital Stay (HOSPITAL_COMMUNITY)
Admission: EM | Admit: 2022-03-04 | Discharge: 2022-03-11 | DRG: 897 | Disposition: A | Discharge status: Skilled Nursing Facility | Payer: 59 | Attending: Infectious Diseases | Admitting: Infectious Diseases

## 2022-03-04 ENCOUNTER — Encounter (HOSPITAL_COMMUNITY): Payer: Self-pay | Admitting: Infectious Diseases

## 2022-03-04 DIAGNOSIS — G9340 Encephalopathy, unspecified: Secondary | ICD-10-CM | POA: Diagnosis present

## 2022-03-04 DIAGNOSIS — Z9049 Acquired absence of other specified parts of digestive tract: Secondary | ICD-10-CM | POA: Diagnosis not present

## 2022-03-04 DIAGNOSIS — F10231 Alcohol dependence with withdrawal delirium: Principal | ICD-10-CM | POA: Diagnosis present

## 2022-03-04 DIAGNOSIS — Z881 Allergy status to other antibiotic agents status: Secondary | ICD-10-CM | POA: Diagnosis not present

## 2022-03-04 DIAGNOSIS — F10931 Alcohol use, unspecified with withdrawal delirium: Secondary | ICD-10-CM

## 2022-03-04 DIAGNOSIS — E876 Hypokalemia: Secondary | ICD-10-CM | POA: Diagnosis present

## 2022-03-04 DIAGNOSIS — Z888 Allergy status to other drugs, medicaments and biological substances status: Secondary | ICD-10-CM

## 2022-03-04 DIAGNOSIS — G8384 Todd's paralysis (postepileptic): Secondary | ICD-10-CM | POA: Diagnosis present

## 2022-03-04 DIAGNOSIS — F419 Anxiety disorder, unspecified: Secondary | ICD-10-CM | POA: Diagnosis present

## 2022-03-04 DIAGNOSIS — Z79899 Other long term (current) drug therapy: Secondary | ICD-10-CM | POA: Diagnosis not present

## 2022-03-04 DIAGNOSIS — R4701 Aphasia: Secondary | ICD-10-CM | POA: Diagnosis present

## 2022-03-04 DIAGNOSIS — G8191 Hemiplegia, unspecified affecting right dominant side: Secondary | ICD-10-CM | POA: Diagnosis present

## 2022-03-04 DIAGNOSIS — R531 Weakness: Secondary | ICD-10-CM | POA: Diagnosis not present

## 2022-03-04 DIAGNOSIS — Z20822 Contact with and (suspected) exposure to covid-19: Secondary | ICD-10-CM | POA: Diagnosis present

## 2022-03-04 DIAGNOSIS — Z981 Arthrodesis status: Secondary | ICD-10-CM | POA: Diagnosis not present

## 2022-03-04 DIAGNOSIS — R569 Unspecified convulsions: Secondary | ICD-10-CM

## 2022-03-04 DIAGNOSIS — G8929 Other chronic pain: Secondary | ICD-10-CM | POA: Diagnosis present

## 2022-03-04 DIAGNOSIS — Y900 Blood alcohol level of less than 20 mg/100 ml: Secondary | ICD-10-CM | POA: Diagnosis present

## 2022-03-04 DIAGNOSIS — F32A Depression, unspecified: Secondary | ICD-10-CM | POA: Diagnosis present

## 2022-03-04 DIAGNOSIS — F10939 Alcohol use, unspecified with withdrawal, unspecified: Secondary | ICD-10-CM

## 2022-03-04 DIAGNOSIS — F17210 Nicotine dependence, cigarettes, uncomplicated: Secondary | ICD-10-CM | POA: Diagnosis present

## 2022-03-04 LAB — PROTIME-INR
INR: 1 (ref 0.8–1.2)
Prothrombin Time: 13 seconds (ref 11.4–15.2)

## 2022-03-04 LAB — COMPREHENSIVE METABOLIC PANEL
ALT: 21 U/L (ref 0–44)
AST: 39 U/L (ref 15–41)
Albumin: 3.3 g/dL — ABNORMAL LOW (ref 3.5–5.0)
Alkaline Phosphatase: 76 U/L (ref 38–126)
Anion gap: 10 (ref 5–15)
BUN: <5 mg/dL — ABNORMAL LOW (ref 8–23)
CO2: 25 mmol/L (ref 22–32)
Calcium: 9.1 mg/dL (ref 8.9–10.3)
Chloride: 106 mmol/L (ref 98–111)
Creatinine, Ser: 0.58 mg/dL (ref 0.44–1.00)
GFR, Estimated: >60 mL/min (ref >60)
Glucose, Bld: 111 mg/dL — ABNORMAL HIGH (ref 70–99)
Potassium: 2.8 mmol/L — ABNORMAL LOW (ref 3.5–5.1)
Sodium: 141 mmol/L (ref 135–145)
Total Bilirubin: 0.7 mg/dL (ref 0.3–1.2)
Total Protein: 6.4 g/dL — ABNORMAL LOW (ref 6.5–8.1)

## 2022-03-04 LAB — DIFFERENTIAL
Abs Immature Granulocytes: 0.02 10*3/uL (ref 0.00–0.07)
Basophils Absolute: 0 10*3/uL (ref 0.0–0.1)
Basophils Relative: 1 %
Eosinophils Absolute: 0 10*3/uL (ref 0.0–0.5)
Eosinophils Relative: 1 %
Immature Granulocytes: 1 %
Lymphocytes Relative: 37 %
Lymphs Abs: 1.5 10*3/uL (ref 0.7–4.0)
Monocytes Absolute: 0.4 10*3/uL (ref 0.1–1.0)
Monocytes Relative: 8 %
Neutro Abs: 2.2 10*3/uL (ref 1.7–7.7)
Neutrophils Relative %: 52 %

## 2022-03-04 LAB — CBC
HCT: 40.6 % (ref 36.0–46.0)
Hemoglobin: 13.2 g/dL (ref 12.0–15.0)
MCH: 34.4 pg — ABNORMAL HIGH (ref 26.0–34.0)
MCHC: 32.5 g/dL (ref 30.0–36.0)
MCV: 105.7 fL — ABNORMAL HIGH (ref 80.0–100.0)
Platelets: 180 10*3/uL (ref 150–400)
RBC: 3.84 MIL/uL — ABNORMAL LOW (ref 3.87–5.11)
RDW: 15.3 % (ref 11.5–15.5)
WBC: 4.2 10*3/uL (ref 4.0–10.5)
nRBC: 0 % (ref 0.0–0.2)

## 2022-03-04 LAB — RESP PANEL BY RT-PCR (FLU A&B, COVID) ARPGX2
Influenza A by PCR: NEGATIVE
Influenza B by PCR: NEGATIVE
SARS Coronavirus 2 by RT PCR: NEGATIVE

## 2022-03-04 LAB — ETHANOL: Alcohol, Ethyl (B): <10 mg/dL (ref <10)

## 2022-03-04 LAB — BASIC METABOLIC PANEL
Anion gap: 11 (ref 5–15)
BUN: <5 mg/dL — ABNORMAL LOW (ref 8–23)
CO2: 25 mmol/L (ref 22–32)
Calcium: 9 mg/dL (ref 8.9–10.3)
Chloride: 104 mmol/L (ref 98–111)
Creatinine, Ser: 0.54 mg/dL (ref 0.44–1.00)
GFR, Estimated: >60 mL/min (ref >60)
Glucose, Bld: 94 mg/dL (ref 70–99)
Potassium: 3.2 mmol/L — ABNORMAL LOW (ref 3.5–5.1)
Sodium: 140 mmol/L (ref 135–145)

## 2022-03-04 LAB — I-STAT CHEM 8, ED
BUN: <3 mg/dL — ABNORMAL LOW (ref 8–23)
Calcium, Ion: 1.01 mmol/L — ABNORMAL LOW (ref 1.15–1.40)
Chloride: 103 mmol/L (ref 98–111)
Creatinine, Ser: 0.5 mg/dL (ref 0.44–1.00)
Glucose, Bld: 109 mg/dL — ABNORMAL HIGH (ref 70–99)
HCT: 41 % (ref 36.0–46.0)
Hemoglobin: 13.9 g/dL (ref 12.0–15.0)
Potassium: 2.8 mmol/L — ABNORMAL LOW (ref 3.5–5.1)
Sodium: 139 mmol/L (ref 135–145)
TCO2: 27 mmol/L (ref 22–32)

## 2022-03-04 LAB — HIV ANTIBODY (ROUTINE TESTING W REFLEX): HIV Screen 4th Generation wRfx: NONREACTIVE

## 2022-03-04 LAB — APTT: aPTT: 23 seconds — ABNORMAL LOW (ref 24–36)

## 2022-03-04 LAB — CBG MONITORING, ED: Glucose-Capillary: 127 mg/dL — ABNORMAL HIGH (ref 70–99)

## 2022-03-04 LAB — MAGNESIUM: Magnesium: 1.7 mg/dL (ref 1.7–2.4)

## 2022-03-04 MED ORDER — THIAMINE HCL 100 MG/ML IJ SOLN
100.0000 mg | Freq: Every day | INTRAMUSCULAR | Status: DC
  Administered 2022-03-04 – 2022-03-05 (×2): 100 mg via INTRAVENOUS
  Filled 2022-03-04 (×3): qty 2

## 2022-03-04 MED ORDER — POTASSIUM CHLORIDE 10 MEQ/100ML IV SOLN
10.0000 meq | INTRAVENOUS | Status: AC
  Administered 2022-03-04 (×4): 10 meq via INTRAVENOUS
  Filled 2022-03-04 (×4): qty 100

## 2022-03-04 MED ORDER — LORAZEPAM 2 MG/ML IJ SOLN: 2.0000 mg | Freq: Once | INTRAMUSCULAR | Status: DC

## 2022-03-04 MED ORDER — THIAMINE HCL 100 MG PO TABS
100.0000 mg | ORAL_TABLET | Freq: Every day | ORAL | Status: DC
  Administered 2022-03-06 – 2022-03-11 (×6): 100 mg via ORAL
  Filled 2022-03-04 (×6): qty 1

## 2022-03-04 MED ORDER — ENOXAPARIN SODIUM 40 MG/0.4ML IJ SOSY
40.0000 mg | PREFILLED_SYRINGE | INTRAMUSCULAR | Status: DC
  Administered 2022-03-04 – 2022-03-11 (×8): 40 mg via SUBCUTANEOUS
  Filled 2022-03-04 (×8): qty 0.4

## 2022-03-04 MED ORDER — ASPIRIN 300 MG RE SUPP: 300.0000 mg | Freq: Once | RECTAL | Status: DC

## 2022-03-04 MED ORDER — POTASSIUM CHLORIDE 10 MEQ/100ML IV SOLN
10.0000 meq | Freq: Once | INTRAVENOUS | Status: AC
  Administered 2022-03-04: 10 meq via INTRAVENOUS
  Filled 2022-03-04: qty 100

## 2022-03-04 MED ORDER — POTASSIUM CHLORIDE 10 MEQ/100ML IV SOLN: 10.0000 meq | INTRAVENOUS | Status: DC

## 2022-03-04 MED ORDER — LORAZEPAM 2 MG/ML IJ SOLN
1.0000 mg | Freq: Four times a day (QID) | INTRAMUSCULAR | Status: DC
  Administered 2022-03-05 – 2022-03-07 (×6): 1 mg via INTRAVENOUS
  Filled 2022-03-04 (×7): qty 1

## 2022-03-04 MED ORDER — LORAZEPAM 1 MG PO TABS: 1.0000 mg | ORAL_TABLET | ORAL | Status: AC | PRN

## 2022-03-04 MED ORDER — SODIUM CHLORIDE 0.9 % IV SOLN
2000.0000 mg | Freq: Once | INTRAVENOUS | Status: AC
  Administered 2022-03-04: 2000 mg via INTRAVENOUS
  Filled 2022-03-04: qty 20

## 2022-03-04 MED ORDER — LORAZEPAM 2 MG/ML IJ SOLN
2.0000 mg | INTRAMUSCULAR | Status: DC | PRN
  Administered 2022-03-04: 2 mg via INTRAVENOUS
  Filled 2022-03-04: qty 1

## 2022-03-04 MED ORDER — IOHEXOL 350 MG/ML SOLN
100.0000 mL | Freq: Once | INTRAVENOUS | Status: AC | PRN
  Administered 2022-03-04: 100 mL via INTRAVENOUS

## 2022-03-04 MED ORDER — MAGNESIUM SULFATE 2 GM/50ML IV SOLN
2.0000 g | Freq: Once | INTRAVENOUS | Status: AC
  Administered 2022-03-04: 2 g via INTRAVENOUS
  Filled 2022-03-04: qty 50

## 2022-03-04 MED ORDER — LORAZEPAM 2 MG/ML IJ SOLN
2.0000 mg | Freq: Four times a day (QID) | INTRAMUSCULAR | Status: AC
  Administered 2022-03-04 – 2022-03-05 (×4): 2 mg via INTRAVENOUS
  Filled 2022-03-04 (×4): qty 1

## 2022-03-04 MED ORDER — POTASSIUM CHLORIDE 10 MEQ/100ML IV SOLN
10.0000 meq | INTRAVENOUS | Status: AC
  Administered 2022-03-04 (×2): 10 meq via INTRAVENOUS
  Filled 2022-03-04 (×2): qty 100

## 2022-03-04 MED ORDER — LORAZEPAM 2 MG/ML IJ SOLN
1.0000 mg | INTRAMUSCULAR | Status: AC | PRN
  Administered 2022-03-04 – 2022-03-06 (×3): 2 mg via INTRAVENOUS
  Filled 2022-03-04 (×3): qty 1

## 2022-03-04 MED ORDER — MAGNESIUM SULFATE 2 GM/50ML IV SOLN: 2.0000 g | Freq: Once | INTRAVENOUS | Status: DC

## 2022-03-04 MED ORDER — LORAZEPAM 2 MG/ML IJ SOLN
2.0000 mg | Freq: Once | INTRAMUSCULAR | Status: AC
  Administered 2022-03-04: 2 mg via INTRAVENOUS
  Filled 2022-03-04: qty 1

## 2022-03-04 NOTE — Progress Notes (Addendum)
Started cEEG study in ED18.  Educated RN regarding patient event button and how to manage EEG when patient relocates to floor. ?

## 2022-03-04 NOTE — ED Provider Notes (Signed)
?[place] ?Provider Note ? ? ?CSN: [id number] ?Arrival date & time: 03/04/22  [id number] ? ?An emergency department physician performed an initial assessment on this suspected stroke patient at [phone] [place]). ? ?History ? ?Chief Complaint  ?Patient presents with  ? Code Stroke  ? ? ?Desere Tuszynski is a 66 y.o. female. ? ?66 yo F with a history of seizure-like activity thought to be due to alcohol withdrawal in the past comes in with a chief complaints of what sounds like a generalized seizure.  This happened about an hour ago after which was noted that she had right-sided weakness and slurred speech which is new for her.  No improvement in route.  Patient has trouble communicating level 5 caveat altered mental status. ? ? ? ?  ? ?Home Medications ?Prior to Admission medications   ?Medication Sig Start Date End Date Taking? Authorizing Provider  ?furosemide (LASIX) 20 MG tablet Take 1 tablet (20 mg total) by mouth daily for 10 days. 07/29/21 08/08/21  Fatima Blank, MD  ?gabapentin (NEURONTIN) 300 MG capsule Take 2 capsules (600 mg total) by mouth 3 (three) times daily. 07/21/21 08/20/21  Sherwood Gambler, MD  ?HYDROcodone-acetaminophen (NORCO) 5-325 MG tablet Take 1 tablet by mouth every 4 (four) hours as needed. 07/21/21   Sherwood Gambler, MD  ?hydrOXYzine (ATARAX/VISTARIL) 25 MG tablet Take 1 tablet (25 mg total) by mouth every 6 (six) hours as needed for anxiety. ?Patient not taking: Reported on 07/20/2021 10/15/20   Money, Lowry Ram, FNP  ?loperamide (IMODIUM) 1 MG/5ML solution Take 1 mg by mouth daily as needed for diarrhea or loose stools.    [provider]  ?ondansetron (ZOFRAN ODT) 4 MG disintegrating tablet Take 1 tablet (4 mg total) by mouth every 8 (eight) hours as needed for nausea or vomiting. 07/21/21   Sherwood Gambler, MD  ?pantoprazole (PROTONIX) 40 MG tablet Take 1 tablet (40 mg total) by mouth daily. 07/29/21   Cardama, Grayce Sessions, MD  ?potassium  chloride SA (KLOR-CON) 20 MEQ tablet Take 1 tablet (20 mEq total) by mouth 2 (two) times daily for 10 days. 07/29/21 08/08/21  Fatima Blank, MD  ?DULoxetine (CYMBALTA) 30 MG capsule Take 1 capsule (30 mg total) by mouth daily. ?Patient not taking: Reported on 09/01/2020 06/11/20 09/02/20  Money, Lowry Ram, FNP  ?metoCLOPramide (REGLAN) 10 MG tablet Take 1 tablet (10 mg total) by mouth every 6 (six) hours as needed for nausea (nausea/headache). 04/06/19 05/24/19  Mesner, Corene Cornea, MD  ?   ? ?Allergies    ?Cephalexin, Cephalosporins, Ketorolac, Prednisone, and Tramadol   ? ?Review of Systems   ?Review of Systems ? ?Physical Exam ?Updated Vital Signs ?BP (!) 127/113   Pulse 77   Temp 97.8 ?F (36.6 ?C) (Oral)   Resp 14   SpO2 99%  ?Physical Exam ?Vitals and nursing note reviewed.  ?Constitutional:   ?   General: She is not in acute distress. ?   Appearance: She is well-developed. She is not diaphoretic.  ?HENT:  ?   Head: Normocephalic and atraumatic.  ?Eyes:  ?   Pupils: Pupils are equal, round, and reactive to light.  ?Cardiovascular:  ?   Rate and Rhythm: Normal rate and regular rhythm.  ?   Heart sounds: No murmur heard. ?  No friction rub. No gallop.  ?Pulmonary:  ?   Effort: Pulmonary effort is normal.  ?   Breath sounds: No wheezing or rales.  ?Abdominal:  ?   General:  There is no distension.  ?   Palpations: Abdomen is soft.  ?   Tenderness: There is no abdominal tenderness.  ?Musculoskeletal:     ?   General: No tenderness.  ?   Cervical back: Normal range of motion and neck supple.  ?Skin: ?   General: Skin is warm and dry.  ?Neurological:  ?   Mental Status: She is alert.  ?   Comments: Slurred speech, R sided weakness  ?Psychiatric:     ?   Behavior: Behavior normal.  ? ? ?ED Results / Procedures / Treatments   ?Labs ?(all labs ordered are listed, but only abnormal results are displayed) ?Labs Reviewed  ?APTT - Abnormal; Notable for the following components:  ?    Result Value  ? aPTT 23 (*)   ? All  other components within normal limits  ?CBC - Abnormal; Notable for the following components:  ? RBC 3.84 (*)   ? MCV 105.7 (*)   ? MCH 34.4 (*)   ? All other components within normal limits  ?COMPREHENSIVE METABOLIC PANEL - Abnormal; Notable for the following components:  ? Potassium 2.8 (*)   ? Glucose, Bld 111 (*)   ? BUN <5 (*)   ? Total Protein 6.4 (*)   ? Albumin 3.3 (*)   ? All other components within normal limits  ?I-STAT CHEM 8, ED - Abnormal; Notable for the following components:  ? Potassium 2.8 (*)   ? BUN <3 (*)   ? Glucose, Bld 109 (*)   ? Calcium, Ion 1.01 (*)   ? All other components within normal limits  ?CBG MONITORING, ED - Abnormal; Notable for the following components:  ? Glucose-Capillary 127 (*)   ? All other components within normal limits  ?RESP PANEL BY RT-PCR (FLU A&B, COVID) ARPGX2  ?ETHANOL  ?PROTIME-INR  ?DIFFERENTIAL  ?RAPID URINE DRUG SCREEN, HOSP PERFORMED  ?URINALYSIS, ROUTINE W REFLEX MICROSCOPIC  ?MAGNESIUM  ?HIV ANTIBODY (ROUTINE TESTING W REFLEX)  ? ? ?EKG ?EKG Interpretation ? ?Date/Time:  Wednesday Mar 04 2022 08:11:26 EDT ?Ventricular Rate:  80 ?PR Interval:  176 ?QRS Duration: 103 ?QT Interval:  397 ?QTC Calculation: 458 ?R Axis:   -44 ?Text Interpretation: Sinus rhythm Left anterior fascicular block Abnormal R-wave progression, late transition Borderline T wave abnormalities No significant change since last tracing Confirmed by Deno Etienne [phone]) on 03/04/2022 8:28:53 AM ? ?Radiology ?CT HEAD CODE STROKE WO CONTRAST ? ?Result Date: 03/04/2022 ?CLINICAL DATA:  Code stroke.  Neuro deficit, acute, stroke suspected EXAM: CT HEAD WITHOUT CONTRAST TECHNIQUE: Contiguous axial images were obtained from the base of the skull through the vertex without intravenous contrast. RADIATION DOSE REDUCTION: This exam was performed according to the departmental dose-optimization program which includes automated exposure control, adjustment of the mA and/or kV according to patient size and/or  use of iterative reconstruction technique. COMPARISON:  CT head 05/10/2021. FINDINGS: Brain: No evidence of acute large vascular territory infarction, hemorrhage, hydrocephalus, extra-axial collection or mass lesion/mass effect. Vascular: No hyperdense vessel identified. Calcific intracranial atherosclerosis. Skull: No acute fracture Sinuses/Orbits: Paranasal sinus mucosal thickening. No acute orbital Other: No mastoid effusions ASPECTS [facility] Stroke Program Early CT Score) total score (0-10 with 10 being normal): 10. IMPRESSION: No evidence of acute intracranial abnormality. ASPECTS is 10. Code stroke imaging results were communicated on 03/04/2022 at 8:32 am to provider Dr. Cheral Marker via secure text paging. Electronically Signed   By: Margaretha Sheffield M.D.   On: 03/04/2022 08:32  ? ?CT  ANGIO HEAD NECK W WO CM W PERF (CODE STROKE) ? ?Result Date: 03/04/2022 ?CLINICAL DATA:  Neuro deficit, acute, stroke suspected EXAM: CT ANGIOGRAPHY HEAD AND NECK CT PERFUSION BRAIN TECHNIQUE: Multidetector CT imaging of the head and neck was performed using the standard protocol during bolus administration of intravenous contrast. Multiplanar CT image reconstructions and MIPs were obtained to evaluate the vascular anatomy. Carotid stenosis measurements (when applicable) are obtained utilizing NASCET criteria, using the distal internal carotid diameter as the denominator. Multiphase CT imaging of the brain was performed following IV bolus contrast injection. Subsequent parametric perfusion maps were calculated using RAPID software. RADIATION DOSE REDUCTION: This exam was performed according to the departmental dose-optimization program which includes automated exposure control, adjustment of the mA and/or kV according to patient size and/or use of iterative reconstruction technique. CONTRAST:  163mL OMNIPAQUE IOHEXOL 350 MG/ML SOLN COMPARISON:  None Available. FINDINGS: CTA NECK Aortic arch: Trace calcified plaque. Great vessel  origins are patent. Right carotid system: Patent. Mild calcified plaque at the bifurcation. No stenosis. Left carotid system: Patent. Mild calcified plaque at the bifurcation. No stenosis. Vertebral arteries: Patent.

## 2022-03-04 NOTE — ED Notes (Signed)
Pt cleaned up. New chuck, sheet, and diaper applied.  ?

## 2022-03-04 NOTE — Progress Notes (Signed)
Admission Note  ?  ?Patient received handbook detailing rights and responsibilities and visitor guidelines. ? ? ?

## 2022-03-04 NOTE — Progress Notes (Addendum)
1600 Patient admitted to unit from ED via stretcher. Vital signs stable. Patient attempted to get out of bed multiple times by climbing over the rails. Patient was redirected verbally. Patient came in with EEG nodes placed on her scalp and she tried to remove those from her person. Patient tried to take off telemetry monitoring stickers from her chest. Patient was verbally redirected. Patient's speech is slurred and at times unintelligible.  ? ?Patient did NOT have a PIV in the right hand.  ? ?1800 Patient's right AC PIV was malpositioned and therefore removed. Pharmacy was messaged with request to adjust the timings for scheduled IV Potassium and Magnesium to begin at 2000, by which time a PIV may be placed in the patient by vascular access team.  ?

## 2022-03-04 NOTE — ED Triage Notes (Signed)
Pt BIB EMS due to a seizure but had right sided deficits. Pt came in as code stroke. Pt is alert but not talking. Hypertension. Has hx of ETOH and seizures.  ?

## 2022-03-04 NOTE — Progress Notes (Signed)
EEG complete - results pending 

## 2022-03-04 NOTE — Procedures (Signed)
Patient Name: [name]  ?MRN: [id number]  ?Epilepsy Attending: Lora Havens  ?Referring Physician/Provider: Kerney Elbe, MD ?Date: 03/04/2222  ?Duration: 36.47 mins ? ?Patient history: 66 year old female with EtOH dependency and a history of seizures as well as EtOH withdrawal, presenting via EMS as a Code Stroke for evaluation of right sided weakness after a seizure at home.  EEG to evaluate for seizure ? ?Level of alertness:  lethargic  ? ?AEDs during EEG study: Ativan, Keppra ? ?Technical aspects: This EEG study was done with scalp electrodes positioned according to the 10-20 International system of electrode placement. Electrical activity was acquired at a sampling rate of 500Hz  and reviewed with a high frequency filter of 70Hz  and a low frequency filter of 1Hz . EEG data were recorded continuously and digitally stored.  ? ?Description: No clear posterior dominant rhythm was seen.  At the beginning of the study EEG showed sharply contoured rhythmic 3 to 5 Hz theta-delta slowing in left hemisphere admixed with sharp waves which appeared quasiperiodic at 1 Hz.  Right hemisphere showed low amplitude 2 to 3 Hz delta slowing admixed with 13 to 14 Hz beta activity. ? ?At around 1116 patient was noted to have right facial twitching followed by head and eye forced deviation and figure-of-four posturing and eventually subtle whole-body twitching.  Concomitant EEG showed 2 to 3 Hz delta slowing admixed with 12 to 13 Hz beta activity in left temporal region which involved all of left hemisphere and then spread to right hemisphere.  Duration of seizure was about 3 minutes. ? ?After the seizure, EEG showed continuous generalized and lateralized left hemisphere 3 to 6 Hz theta-delta slowing. ? ?At around 1134, another event was noted during which patient had right head and eye deviation followed by subtle whole-body twitching. Concomitant EEG showed 2 to 3 Hz delta slowing admixed with 12 to 13 Hz beta activity in  left temporal region which involved all of left hemisphere and then spread to right hemisphere.  Duration of seizure was about 2 minutes. ? ?Hyperventilation and photic stimulation were not performed.    ? ?ABNORMALITY ?-Focal motor seizure, left temporal region ?-Continuous slow, generalized and lateralized left hemisphere ? ?IMPRESSION: ?This study showed 2 seizures as described above at 1116 and 1134, lasting 3 and 2 minutes respectively, arising from left temporal region.  Additionally there is cortical dysfunction in left hemisphere likely secondary to underlying structural abnormality.  There is also moderate to severe diffuse encephalopathy, nonspecific etiology but likely related to seizures . ? ?[name]  ? ?

## 2022-03-04 NOTE — Code Documentation (Signed)
Ms Jannessa Ogden is a 66 yr old female with a history of ETOH abuse, seizures, chronic pain who was BIB [facility] for seizure. She is on no known blood thinner. Reportedly she was last known well this morning at 0710. Pt arrived at 0811, EDP activated code stroke for aphasia and rt weakness at 0811.  ?Pt taken to CT at 0814. She was met by [facility] and Neurologist there. CTNC neg for acute hemorrhage per Dr. Cheral Marker. Pt with aphasia and rt sided weakness. Please see flowsheet for details. After IV access obtained, CTA and P preformed. CTA neg for LVO per Dr. Cheral Marker. Pt returned to room 18. Loaded with 2 G Keppra. Pt will need q 30 min VS and NIHSS until outside of window, 1140. Bedside handoff with [place]. Pt not candidate for TNK as low suspicion for stroke. Not candidate for NIR as LVO negative. ?

## 2022-03-04 NOTE — ED Notes (Signed)
EEG tech at bedside. Pt appears to be having seizure Dr. Adela Lank notified and at bedside. ?

## 2022-03-04 NOTE — Consult Note (Addendum)
NEURO HOSPITALIST CONSULT NOTE   Requestig physician: Dr. Adela Lank  Reason for Consult: Acute onset of right sided weakness and global aphasia  History obtained from:  EDP and Chart     HPI:                                                                                                                                          [name] is an 66 y.o. female with a PMHx of EtOH dependence, anxiety, chronic pain, depression, alcohol withdrawal seizures, who had witnessed GTC activity at her place of residence at about 0700, after which she was noted to have right sided weakness and global aphasia, which per report was new for her. She was brought in  by EMS who felt that the weakness was most likely Todd's paralysis, but her mutism and weakness persisted in the ED and a Code Stroke was called.   Past Medical History:  Diagnosis Date   Alcohol dependence (HCC)    Anxiety    Chronic pain    Depression    Diverticulitis    Herniated cervical disc    Pancreatitis    Seizures (HCC)    alcoholic seizures    Past Surgical History:  Procedure Laterality Date   ABDOMINAL HYSTERECTOMY     ANKLE SURGERY Right    CARPAL TUNNEL RELEASE Bilateral    CERVICAL FUSION     CHOLECYSTECTOMY     KNEE SURGERY      No family history on file.          Social History:  reports that she has been smoking cigarettes. She has a 1.75 pack-year smoking history. She has never used smokeless tobacco. She reports that she does not currently use alcohol. She reports that she does not currently use drugs after having used the following drugs: Marijuana.  Allergies  Allergen Reactions   Cephalexin Other (See Comments)    Unknown   Cephalosporins Hives   Ketorolac Hives   Prednisone Swelling   Tramadol Hives    HOME MEDICATIONS:                                                                                                                      No current facility-administered  medications on file prior  to encounter.   Current Outpatient Medications on File Prior to Encounter  Medication Sig Dispense Refill   furosemide (LASIX) 20 MG tablet Take 1 tablet (20 mg total) by mouth daily for 10 days. 10 tablet 0   gabapentin (NEURONTIN) 300 MG capsule Take 2 capsules (600 mg total) by mouth 3 (three) times daily. 180 capsule 0   HYDROcodone-acetaminophen (NORCO) 5-325 MG tablet Take 1 tablet by mouth every 4 (four) hours as needed. 10 tablet 0   hydrOXYzine (ATARAX/VISTARIL) 25 MG tablet Take 1 tablet (25 mg total) by mouth every 6 (six) hours as needed for anxiety. (Patient not taking: Reported on 07/20/2021) 30 tablet 0   loperamide (IMODIUM) 1 MG/5ML solution Take 1 mg by mouth daily as needed for diarrhea or loose stools.     ondansetron (ZOFRAN ODT) 4 MG disintegrating tablet Take 1 tablet (4 mg total) by mouth every 8 (eight) hours as needed for nausea or vomiting. 10 tablet 0   pantoprazole (PROTONIX) 40 MG tablet Take 1 tablet (40 mg total) by mouth daily. 30 tablet 0   potassium chloride SA (KLOR-CON) 20 MEQ tablet Take 1 tablet (20 mEq total) by mouth 2 (two) times daily for 10 days. 20 tablet 0   [DISCONTINUED] DULoxetine (CYMBALTA) 30 MG capsule Take 1 capsule (30 mg total) by mouth daily. (Patient not taking: Reported on 09/01/2020) 30 capsule 0   [DISCONTINUED] metoCLOPramide (REGLAN) 10 MG tablet Take 1 tablet (10 mg total) by mouth every 6 (six) hours as needed for nausea (nausea/headache). 6 tablet 0     ROS:                                                                                                                                       Unable to obtain due to global aphasia.    BP (!) 193/96   Pulse 98   Temp 97.8 F (36.6 C) (Oral)   Resp 19   SpO2 92%     General Examination:                                                                                                       Physical Exam  HEENT-  [place]/AT   Lungs- Respirations  unlabored Extremities- No edema   Neurological Examination Mental Status: Awake with global aphasia. No speech output. Not following any commands. Has a blank-appearing stare and does not make eye contact. No attempts to communicate.  Cranial Nerves: II: Does not reliably blink  to threat in left or right temporal visual fields. PERRL at 3 mm.   III,IV, VI: Eyes are conjugate at the midline but some roving EOM and low-amplitude saccades are noted. Reacts with rapid blinking and chaotic eye movements to penlight, which resolve when penlight is moved away.  V: Does not react to cold temp on R or L. Does react to eyelid stimulation bilaterally VII: Subtle weakness of right lower quadrant of face.  VIII: Unable to assess.  IX,X: Non-cooperative XI: Head is near the midline XII: Does not protrude tongue to command Motor/Sensory: Does not move any extremity to commands. Moves left side more than left when transferring from CT table to stretcher. When BUE are passively elevated and released on multiple trials, LUE consistently remains elevated, but RUE drops about 10 inches, then remains in mid-air. No response to light pinch of either upper extremity.  Right foot is noted to be externally rotated when lying supine with legs extended. Passive elevation and release of each leg results in LLE remaining elevated, but RLE drifts back to the bed. Withdrawal to noxious plantar stimulation is brisker on the left than the right.  Deep Tendon Reflexes: 1+ bilateral brachioradialis and patellae Plantars: Mute bilaterally  Cerebellar/Gait: Unable to assess   Lab Results: Basic Metabolic Panel: No results for input(s): NA, K, CL, CO2, GLUCOSE, BUN, CREATININE, CALCIUM, MG, PHOS in the last 168 hours.  CBC: No results for input(s): WBC, NEUTROABS, HGB, HCT, MCV, PLT in the last 168 hours.  Cardiac Enzymes: No results for input(s): CKTOTAL, CKMB, CKMBINDEX, TROPONINI in the last 168 hours.  Lipid  Panel: No results for input(s): CHOL, TRIG, HDL, CHOLHDL, VLDL, LDLCALC in the last 168 hours.  Imaging: CT HEAD CODE STROKE WO CONTRAST  Result Date: 03/04/2022 CLINICAL DATA:  Code stroke.  Neuro deficit, acute, stroke suspected EXAM: CT HEAD WITHOUT CONTRAST TECHNIQUE: Contiguous axial images were obtained from the base of the skull through the vertex without intravenous contrast. RADIATION DOSE REDUCTION: This exam was performed according to the departmental dose-optimization program which includes automated exposure control, adjustment of the mA and/or kV according to patient size and/or use of iterative reconstruction technique. COMPARISON:  CT head 05/10/2021. FINDINGS: Brain: No evidence of acute large vascular territory infarction, hemorrhage, hydrocephalus, extra-axial collection or mass lesion/mass effect. Vascular: No hyperdense vessel identified. Calcific intracranial atherosclerosis. Skull: No acute fracture Sinuses/Orbits: Paranasal sinus mucosal thickening. No acute orbital Other: No mastoid effusions ASPECTS [facility] Stroke Program Early CT Score) total score (0-10 with 10 being normal): 10. IMPRESSION: No evidence of acute intracranial abnormality. ASPECTS is 10. Code stroke imaging results were communicated on 03/04/2022 at 8:32 am to provider Dr. Otelia Limes via secure text paging. Electronically Signed   By: Feliberto Harts M.D.   On: 03/04/2022 08:32     Assessment: 66 year old female with EtOH dependency and a history of seizures as well as EtOH withdrawal, presenting via EMS as a Code Stroke for evaluation of right sided weakness after a seizure at home.  1. Exam reveals global aphasia and right sided weakness. Findings best localize as focal hypofunction of the left frontal and temporal cortices. DDx includes acute stroke co-occurring with a seizure, versus EtOH withdrawal or epileptic seizure followed by Todd's paralysis and postictal mutism. 2. TNK not indicated due to seizure at  onset of weakness. Risks of hemorrhage significantly outweigh potential benefits given significantly higher likelihood of seizure as the primary etiology for her presentation.  3. CT head:  No  evidence of acute intracranial abnormality. ASPECTS is 10 4. Will need CTA/CTP to fully rule out acute stroke and/or LVO  Recommendations: 1. STAT CTA of head and neck with CTP has been ordered.  2. Frequent neuro checks 3. Seizure precautions.  4. Administering 2000 mg IV Keppra x 1. Will not continue as scheduled dosing unless EEG provides an indication, as the seizure is felt most likely to be due to EtOH withdrawal.  5. EtOH level pending 6. Tox screen pending 7 CIWA protocol with PRN Ativan (ordered) 8. Continue her Neurontin 9. STAT EEG (ordered) 10. May need MRI brain if CTA/CTP unrevealing and suspected postictal state does not resolve.  11. Permissive HTN. Correct SBP if > 220.  12. 300 mg rectal ASA x 1 now (ordered)  Addendum: - No LVO on CTA. - CTP with left cerebral hemisphere increased signal on CBV map but no delayed perfusion. Findings are most consistent with focal post-seizure hyperemia, but will still need to rule out possible ongoing subclinical seizure activity with EEG  Addendum: - Had 2 seizures while EEG was being placed, consisting of right head deviation and figure-4 posturing to the right, evolving to full-body jerking. The seizures occurred after Keppra load was completed, first spell at 10:56 AM lasting about 3 minutes. Second episode with similar semiology also lasting 3 minutes, occurring about 10 minutes after the first. Then had a 3rd spell that appeared to be the start of a seizure with right sided symptoms that did not progress to GTC seizure.   - She was administered 2 mg IV Ativan and has not had recurrence of seizure activity.  - Will start her on a scheduled benzodiazepine with taper for possible EtOH withdrawal seizures: 2 mg IV q6h x 4 doses, then 1 mg q6h x 8  doses, then switch to PRN only.  - Now being switched to LTM EEG  Electronically signed: Dr. Caryl Pina 03/04/2022, 8:37 AM

## 2022-03-04 NOTE — ED Notes (Signed)
EEG at bedside.

## 2022-03-04 NOTE — H&P (Signed)
Date: 03/04/2022               Patient Name:  [name] MRN: [id number]  DOB: 1956/09/18 Age / Sex: 66 y.o., female   PCP: [facility]         Medical Service: Internal Medicine Teaching Service         Attending Physician: Dr. Ninetta Lights, Lacretia Leigh, MD    First Contact: Fredonia Highland, MD Pager: MB [phone]  Second Contact: Marolyn Haller  Pager: PC [phone]       After Hours (After 5p/  First Contact Pager: [phone]  weekends / holidays): Second Contact Pager: [phone]   SUBJECTIVE   Chief Complaint: Witnessed seizure  History of Present Illness: [name] is a 66 year old female with history of alcohol dependence alcohol withdrawal seizures who presented by EMS for witness seizure.  Patient was brought to ED and had persistent mutism and weakness so a code stroke was called.  CT head and CT angio completed to rule out acute stroke.  Patient unable to provide history on my exam.  I called numbers in patient's chart and could not reach [name] (daughter) or [name] (significant other).  I did reach [name], best friend of patient, and she witnessed seizure.  [name] has been friends with [name] for many years but they lost contact.  Almost a month ago [name] come back to [place] and they have been spending time together.  [name] mentioned she found patient sitting in stool and intoxicated 1 month ago.  Patient and her significant other drink beer all day starting in the morning.  [name] has cut back on her alcohol use this month.  She is not drinking first thing in the morning and has coffee with [name] instead.  This morning patient could not fix coffee as she was shaking. She thought patient could be having a stroke. Significant other was there and noted this was like her prior seizures.  [name] said she noticed odd facial movement on patient as well. She called EMS. She also noted patient took her gabapentin with her coffee.    Level 5 caveat, patient altered and unable to provide medications or history. Information taking from chart.   Meds:   Past Medical History:  Diagnosis Date   Alcohol dependence (HCC)    Anxiety    Chronic pain    Depression    Diverticulitis    Herniated cervical disc    Pancreatitis    Seizures (HCC)    alcoholic seizures    Past Surgical History:  Procedure Laterality Date   ABDOMINAL HYSTERECTOMY     ANKLE SURGERY Right    CARPAL TUNNEL RELEASE Bilateral    CERVICAL FUSION     CHOLECYSTECTOMY     KNEE SURGERY      Social:  Lives With: Significant other in hotel Support: To my knowledge, patients significant other and friend in chart. Daughters phone disconnected . Social History   Tobacco Use   Smoking status: Every Day    Packs/day: 0.25    Years: 7.00    Pack years: 1.75    Types: Cigarettes   Smokeless tobacco: Never  Vaping Use   Vaping Use: Never used  Substance Use Topics   Alcohol use: Not Currently    Comment: drinks a fifth of vodka daily   Drug use: Not Currently    Types: Marijuana     Family History: No family history on file.   Allergies: Allergies  as of 03/04/2022 - Review Complete 03/04/2022  Allergen Reaction Noted   Cephalexin Other (See Comments) 01/20/2019   Cephalosporins Hives 09/01/2020   Ketorolac Hives 05/03/2016   Prednisone Swelling 02/28/2019   Tramadol Hives 05/03/2016    Review of Systems: Unable to perform due to AMS.   OBJECTIVE:   Physical Exam: Blood pressure 132/80, pulse 66, temperature 97.8 F (36.6 C), temperature source Oral, resp. rate 15, SpO2 100 %.  Constitutional: Somnolent women, EEG in place, vitals stable  HENT: normocephalic atraumatic, mucous membranes moist Eyes: conjunctiva non-erythematous Neck: supple Cardiovascular: regular rate and rhythm, no m/r/g Pulmonary/Chest: normal work of breathing on room air, lungs clear to auscultation bilaterally Abdominal: soft, BS+ MSK: normal bulk  and tone Skin: warm and dry Neurological: Mental Status: Patient is somnolent. Opens eyes to voice .  Cranial Nerves: II: Pupils equal, round, and reactive to light.   III: Opens eyes to voice  IV: not assessed V: Corneal reflex intact VI: Not assessed VII: Facial movement symmetric. Closes eyes VIII: hearing is intact to voice X: not assessed  XI: not assessed XII: not assessed Motor: Patient not responding to stimuli Sensory: Unable to assess   ASSESSMENT & PLAN:    Assessment & Plan by Problem: Principal Problem:   Seizure (HCC)   [name] is a 66 y.o. with pertinent PMH of alcohol use disorder complicated by alcohol withdrawal seizures who presented with witnessed seizure like activity.   #Acute Encephalopathy - Patient decreased alcohol intake at home. Last drink reported to be yesterday at 8 PM. Patient alcohol level <10. Patient was evaluated and no stroke seen on CT Head or CTA. Expect alcohol withdrawal seizure at this time. -Appreciate neurology consult and recommendations.  - CIWA with ativan protocol - Follow up EEG - Thiamine  - Admit to progressive unit. Frequent Neuro checks. If patient not improving will obtain recommended MRI.   #Hypokalemia - K 2.8, given 30 meq in ED.  - Check BMP, follow up magnesium   Diet: NPO VTE: Enoxaparin IVF: None,None Code: Full  Prior to Admission Living Arrangement: Home living with significant other Anticipated Discharge Location: Home Barriers to Discharge: Improvement in mental status   Dispo: Admit patient to Inpatient with expected length of stay greater than 2 midnights.  Signed: Cleotilde Neer, MD Internal Medicine Resident PGY-3 Pager: [phone]  03/04/2022, 2:26 PM

## 2022-03-05 DIAGNOSIS — R569 Unspecified convulsions: Secondary | ICD-10-CM | POA: Diagnosis not present

## 2022-03-05 DIAGNOSIS — F10931 Alcohol use, unspecified with withdrawal delirium: Secondary | ICD-10-CM

## 2022-03-05 LAB — RAPID URINE DRUG SCREEN, HOSP PERFORMED
Amphetamines: NOT DETECTED
Barbiturates: NOT DETECTED
Benzodiazepines: POSITIVE — AB
Cocaine: NOT DETECTED
Opiates: NOT DETECTED
Tetrahydrocannabinol: NOT DETECTED

## 2022-03-05 LAB — URINALYSIS, ROUTINE W REFLEX MICROSCOPIC
Bilirubin Urine: NEGATIVE
Glucose, UA: NEGATIVE mg/dL
Hgb urine dipstick: NEGATIVE
Ketones, ur: NEGATIVE mg/dL
Leukocytes,Ua: NEGATIVE
Nitrite: NEGATIVE
Protein, ur: NEGATIVE mg/dL
Specific Gravity, Urine: 1.012 (ref 1.005–1.030)
pH: 8 (ref 5.0–8.0)

## 2022-03-05 LAB — PHOSPHORUS: Phosphorus: 2.2 mg/dL — ABNORMAL LOW (ref 2.5–4.6)

## 2022-03-05 LAB — BASIC METABOLIC PANEL
Anion gap: 10 (ref 5–15)
BUN: <5 mg/dL — ABNORMAL LOW (ref 8–23)
CO2: 26 mmol/L (ref 22–32)
Calcium: 9.2 mg/dL (ref 8.9–10.3)
Chloride: 105 mmol/L (ref 98–111)
Creatinine, Ser: 0.52 mg/dL (ref 0.44–1.00)
GFR, Estimated: >60 mL/min (ref >60)
Glucose, Bld: 83 mg/dL (ref 70–99)
Potassium: 3.4 mmol/L — ABNORMAL LOW (ref 3.5–5.1)
Sodium: 141 mmol/L (ref 135–145)

## 2022-03-05 LAB — MAGNESIUM: Magnesium: 2.1 mg/dL (ref 1.7–2.4)

## 2022-03-05 MED ORDER — POTASSIUM CHLORIDE 10 MEQ/100ML IV SOLN
10.0000 meq | INTRAVENOUS | Status: AC
  Administered 2022-03-05 (×2): 10 meq via INTRAVENOUS
  Filled 2022-03-05 (×2): qty 100

## 2022-03-05 MED ORDER — SODIUM CHLORIDE 0.9 % IV SOLN
500.0000 mg | Freq: Once | INTRAVENOUS | Status: AC
  Administered 2022-03-05: 500 mg via INTRAVENOUS
  Filled 2022-03-05: qty 3.85

## 2022-03-05 MED ORDER — DEXTROSE 5 % IV SOLN
20.0000 mmol | Freq: Once | INTRAVENOUS | Status: AC
  Administered 2022-03-05: 20 mmol via INTRAVENOUS
  Filled 2022-03-05: qty 6.67

## 2022-03-05 NOTE — Procedures (Addendum)
Patient Name: [name]  MRN: [id number]  Epilepsy Attending: Lora Havens  Referring Physician/Provider: Kerney Elbe, MD Duration: 03/04/2022 1145 to 03/05/2022 1145   Patient history: 66 year old female with EtOH dependency and a history of seizures as well as EtOH withdrawal, presenting via EMS as a Code Stroke for evaluation of right sided weakness after a seizure at home.  EEG to evaluate for seizure   Level of alertness: awake, asleep    AEDs during EEG study: Ativan   Technical aspects: This EEG study was done with scalp electrodes positioned according to the 10-20 International system of electrode placement. Electrical activity was acquired at a sampling rate of 500Hz  and reviewed with a high frequency filter of 70Hz  and a low frequency filter of 1Hz . EEG data were recorded continuously and digitally stored.    Description: No clear posterior dominant rhythm was seen.  Sleep was characterized by vertex waves, sleep spindles (12 to 14 Hz), maximal frontocentral region. EEG also showed continuous and maximal left posterior temporal region 3 to 5 Hz theta-delta slowing in left hemisphere which at times appear rhythmic.  Sharp waves are also noted in left posterior temporal region which at times appeared quasiperiodic at 1 Hz.  There is also excessive 13 to 15 Hz generalized beta activity.  Hyperventilation and photic stimulation were not performed.      ABNORMALITY -Sharp wave, left posterior temporal region -Intermittent rhythmic delta slowing, left posterior temporal region -Continuous slow, left hemisphere, maximal left posterior temporal region -Excessive beta, generalized   IMPRESSION: This study showed evidence of epileptogenicity in left posterior temporal region. There is cortical dysfunction in left hemisphere, maximal left posterior temporal region likely secondary to underlying structural abnormality and high risk of seizure recurrence.    Additionally there is  moderate diffuse encephalopathy, nonspecific to etiology. The excessive beta activity is most likely due to benzodiazepine use and is a benign EEG pattern.  No definite seizures were seen during the study.   Ebonie Westerlund Barbra Sarks

## 2022-03-05 NOTE — Progress Notes (Signed)
LTM maint complete - no skin breakdown under: Fp1 Fp2 A1 [facility] monitored, Event button test confirmed by [facility].

## 2022-03-05 NOTE — Progress Notes (Addendum)
Subjective: Has been agitated overnight.  Per sitter at bedside patient does understand commands but still keeps trying to get out of the bed.  Patient was in restraints and had just received IV Ativan before my arrival.  ROS: Unable to obtain due to poor mental status  Examination  Vital signs in last 24 hours: Temp:  [97.7 F (36.5 C)-98.9 F (37.2 C)] 98.5 F (36.9 C) (05/18 0749) Pulse Rate:  [66-83] 76 (05/18 0749) Resp:  [13-20] 18 (05/18 0749) BP: (119-157)/(75-113) 137/75 (05/18 0749) SpO2:  [93 %-100 %] 98 % (05/18 0749)  General: lying in bed, NAD Neuro: Exam was limited as patient had just received IV Ativan 2 mg.  She did wince to noxious stimuli but did not follow commands, PERRLA, no forced gaze deviation, withdraws to noxious stimuli in all 4 extremities.  Per sitter at bedside was able to spontaneously move all 4 extremities symmetrically before administration of Ativan  Basic Metabolic Panel: Recent Labs  Lab 03/04/22 0841 03/04/22 0843 03/04/22 1559 03/05/22 0640  NA 141 139 140 141  K 2.8* 2.8* 3.2* 3.4*  CL 106 103 104 105  CO2 25  --  25 26  GLUCOSE 111* 109* 94 83  BUN <5* <3* <5* <5*  CREATININE 0.58 0.50 0.54 0.52  CALCIUM 9.1  --  9.0 9.2  MG  --   --  1.7 2.1  PHOS  --   --   --  2.2*    CBC: Recent Labs  Lab 03/04/22 0841 03/04/22 0843  WBC 4.2  --   NEUTROABS 2.2  --   HGB 13.2 13.9  HCT 40.6 41.0  MCV 105.7*  --   PLT 180  --      Coagulation Studies: Recent Labs    03/04/22 0841  LABPROT 13.0  INR 1.0    Imaging CT head without contrast 03/04/2022: No evidence of acute intracranial abnormality. ASPECTS is 10.  CT angio head and neck with and without contrast 03/04/2022: No large vessel occlusion, hemodynamically significant stenosis, or evidence of dissection.   CT perfusion 03/04/2022: Perfusion imaging demonstrates no evidence of core infarction or penumbra. Visually, there is increased blood volume and blood flow on the  left with decreased mean transit time. There is also relatively increased greater in left MCA branches than on the right on the CTA. This suggests hyperemia on the left.  ASSESSMENT AND PLAN: 66 year old female with alcohol use disorder and history of seizures who presented with right-sided weakness after seizure at home.  Breakthrough seizure Todd's palsy Alcohol use disorder Agitation Acute encephalopathy, due to seizure and medications -Etiology of breakthrough seizures: Patient is not on any AEDs other than gabapentin.  Alcohol level on arrival was less than 10.  Urine drug screen was negative except for benzos which were administered to patient.  Urine analysis is pending. -No definite seizures overnight but continues to have intermittent rhythmic delta activity in right posterior temporal region with high risk of seizure recurrence  Recommendations -We will give one-time dose of IV phenobarbital 500 mg to help with agitation , increased risk of seizure recurrence and alcohol withdrawal -Continue Ativan 1mg  every 6 hours.  We will plan to taper off as EEG and patient's mental status improves -Continue LTM EEG today.  Will likely discontinue tomorrow if EEG is improved and no seizures overnight -Continue CIWA protocol, thiamine -Continue seizure precautions As needed IV Ativan 2 mg for seizure-like activity -Management of rest of comorbidities per primary team -Plan  discussed with Dr. Ninetta Lights  I have spent a total of  36  minutes with the patient reviewing hospital notes,  test results, labs and examining the patient as well as establishing an assessment and plan.  > 50% of time was spent in direct patient care.  Lindie Spruce Epilepsy [facility] For questions after 5pm please refer to AMION to reach the Neurologist on call

## 2022-03-05 NOTE — Progress Notes (Signed)
   Subjective: Patient was seen at bedside during rounds this morning.  She is alert and able to converse with [place].  States that she has had a few seizures this morning.  However, she is unable to answer other questions appropriately.  She states that we are currently [place] at a school.  When asked if she has any questions, she states that she likes broccoli and lentils.   Objective:  Vital signs in last 24 hours: Vitals:   03/04/22 1559 03/04/22 1625 03/04/22 1950 03/05/22 0439  BP: 133/77 133/77 (!) 157/89 (!) 156/93  Pulse: 83 83 68 80  Resp: 20  19 18   Temp: 98.9 F (37.2 C)  97.9 F (36.6 C) 97.7 F (36.5 C)  TempSrc: Axillary  Oral Oral  SpO2: 98%  97% 97%   General: NAD, nl appearance HE: Normocephalic, atraumatic, EOMI, Conjunctivae normal ENT: No congestion, no rhinorrhea, no exudate or erythema  Cardiovascular: Normal rate, regular rhythm. No murmurs, rubs, or gallops Pulmonary: Effort normal, breath sounds normal. No wheezes, rales, or rhonchi Abdominal: soft, nontender, bowel sounds present Musculoskeletal: no swelling, deformity, injury or tenderness in extremities Skin: Warm, dry, no bruising, erythema, or rash Neuro: Alert but unable to answer questions appropriately. States we are at a school currently. Psychiatric/Behavioral: normal mood, normal behavior     Assessment/Plan:  Principal Problem:   Seizure (HCC)  #Alcohol withdrawal seizures Patient with recent abrupt discontinuation of alcohol intake at home. Stroke work-up was unrevealing.  The patient is alert and responds with incorrect answers to questions today, though this is an improvement from her exam prior. There were no definite seizures on EEG today, though patient states she has had multiple seizures today.  - Appreciate neurology consult and recommendations - CIWA with ativan - Scheduled benzodiazepine with taper: 1 mg IV q6h x 8 doses, then switch to PRN only - Thiamine  - Frequent Neuro checks.   - If patient not improving will obtain recommended MRI   #Hypokalemia, improving Patient presented here with potassium of 2.8.  Most recent potassium is 3.4 this morning.  Magnesium is 2.1. -Trend BMP -Repleting electrolytes as needed  Prior to Admission Living Arrangement: In a hotel with significant other Barriers to Discharge: Medical stability Dispo: Anticipated discharge pending medical stability  , MD 03/05/2022, 6:52 AM Pager: [phone]  After 5pm on weekdays and 1pm on weekends: On Call pager [phone]

## 2022-03-05 NOTE — Plan of Care (Signed)
  Problem: Clinical Measurements: Goal: Will remain free from infection Outcome: Progressing Goal: Respiratory complications will improve Outcome: Progressing   Problem: Activity: Goal: Risk for activity intolerance will decrease Outcome: Progressing   Problem: Coping: Goal: Level of anxiety will decrease Outcome: Progressing   

## 2022-03-05 NOTE — Progress Notes (Signed)
EEG maintenance performed.  No skin breakdown observed at electrode sites A1, Fp2.

## 2022-03-06 ENCOUNTER — Inpatient Hospital Stay (HOSPITAL_COMMUNITY): Payer: 59

## 2022-03-06 DIAGNOSIS — R569 Unspecified convulsions: Secondary | ICD-10-CM | POA: Diagnosis not present

## 2022-03-06 DIAGNOSIS — F10931 Alcohol use, unspecified with withdrawal delirium: Secondary | ICD-10-CM | POA: Diagnosis not present

## 2022-03-06 LAB — PHOSPHORUS: Phosphorus: 3.8 mg/dL (ref 2.5–4.6)

## 2022-03-06 LAB — BASIC METABOLIC PANEL
Anion gap: 11 (ref 5–15)
BUN: 3 mg/dL — ABNORMAL LOW (ref 8–23)
CO2: 25 mmol/L (ref 22–32)
Calcium: 8.5 mg/dL — ABNORMAL LOW (ref 8.9–10.3)
Chloride: 102 mmol/L (ref 98–111)
Creatinine, Ser: 0.44 mg/dL (ref 0.44–1.00)
GFR, Estimated: >60 mL/min (ref >60)
Glucose, Bld: 75 mg/dL (ref 70–99)
Potassium: 3.1 mmol/L — ABNORMAL LOW (ref 3.5–5.1)
Sodium: 138 mmol/L (ref 135–145)

## 2022-03-06 LAB — MAGNESIUM: Magnesium: 1.8 mg/dL (ref 1.7–2.4)

## 2022-03-06 MED ORDER — POTASSIUM CHLORIDE 20 MEQ PO PACK
40.0000 meq | PACK | ORAL | Status: AC
  Administered 2022-03-06 (×2): 40 meq via ORAL
  Filled 2022-03-06 (×2): qty 2

## 2022-03-06 MED ORDER — ACETAMINOPHEN 325 MG PO TABS
650.0000 mg | ORAL_TABLET | Freq: Four times a day (QID) | ORAL | Status: DC | PRN
  Administered 2022-03-06 – 2022-03-07 (×2): 650 mg via ORAL
  Filled 2022-03-06 (×2): qty 2

## 2022-03-06 MED ORDER — LORAZEPAM 2 MG/ML IJ SOLN
2.0000 mg | Freq: Once | INTRAMUSCULAR | Status: AC
Start: 2022-03-06 — End: 2022-03-06
  Administered 2022-03-06: 2 mg via INTRAVENOUS
  Filled 2022-03-06: qty 1

## 2022-03-06 NOTE — Progress Notes (Signed)
Pt brought down to MRI for exam. Prepared pt for exam and began scanning pt, tolerated exam well for majority of pre contrast imaging as appeared to be sleeping. Toward end of pre contrast imaging pt woke up and began moving continuously while beating on the inside of the scanner bore. Exam stopped and called RN for potential further meds to be administered. RN administered further meds and I reattempted remaining images but as soon as the scanner started making noise, pt would immediately wake up and begin moving again and beating on inside of scanner bore. Further imaging aborted. Unable to obtain further diagnostic images including post-contrast images. Order changed to Brain WO to reflect this. Exam completed as a limited study. Able to obtain AX DWI, Sag T1, AX T2, AX T2 FLAIR, and AX MPGR.

## 2022-03-06 NOTE — Progress Notes (Signed)
   Subjective: Patient was seen at bedside during rounds this morning.  She appears more alert compared to yesterday.  She states that she has been having some pain in her neck and her right ankle from prior surgeries.  States that her left hand was swollen when she was initially brought in by EMS.  She is not able to state her name at this time but does say we are in a hospital. No other complaints or concerns at this time.  Objective:  Vital signs in last 24 hours: Vitals:   03/05/22 1612 03/05/22 1933 03/05/22 2309 03/06/22 0332  BP: (!) 149/75 (!) 149/102 (!) 136/114 (!) 144/94  Pulse: 98 86 90 88  Resp: 16 18 18 16   Temp: 98.4 F (36.9 C) 98.3 F (36.8 C) 97.8 F (36.6 C) (!) 97.5 F (36.4 C)  TempSrc: Oral Oral Oral Oral  SpO2: 100% 96% 100% 100%   General: NAD, nl appearance HE: Normocephalic, atraumatic, EOMI, Conjunctivae normal ENT: No congestion, no rhinorrhea, no exudate or erythema  Cardiovascular: Normal rate, regular rhythm. No murmurs, rubs, or gallops Pulmonary: Effort normal, breath sounds normal. No wheezes, rales, or rhonchi Abdominal: soft, nontender, bowel sounds present Musculoskeletal: no swelling, deformity, injury or tenderness in extremities Skin: Warm, dry, no bruising, erythema, or rash Neuro: Alert and oriented to place only (says that we are in a hospital). No other focal deficits appreciated Psychiatric/Behavioral: normal mood, normal behavior      Assessment/Plan:  Principal Problem:   Seizure ([place]) Active Problems:   Alcohol withdrawal syndrome, with delirium ([place])   #Alcohol withdrawal seizures Patient with recent abrupt discontinuation of alcohol intake at home. Stroke work-up was unrevealing.  The patient has been more alert over the past few days though not quite at her baseline. EEG today appears stable compared to prior though does show slowing in left posterior temporal region. Neurology has been consulted and are planning on obtaining  MRI to see if they will pursue another EEG. - Appreciate neurology consult and recommendations - MRI pending - CIWA with ativan - Scheduled benzodiazepine with taper: 1 mg IV q6h x 8 doses, then switch to PRN only - Thiamine  - Frequent Neuro checks   #Hypokalemia, improving Patient presented here with potassium of 2.8.  Most recent potassium is 3.1 this morning.  Magnesium is 1.8. -Trend BMP -Repleting electrolytes as needed  Prior to Admission Living Arrangement: In a hotel with significant other Barriers to Discharge: Medical stability Dispo: Anticipated discharge pending medical stability  Orvis Brill, MD 03/06/2022, 6:43 AM Pager: [phone]  After 5pm on weekdays and 1pm on weekends: On Call pager [phone]

## 2022-03-06 NOTE — Progress Notes (Signed)
Transport just arrived to take pt down for an MRI.... 2 mg of Ativan given before procedure as ordered.

## 2022-03-06 NOTE — Progress Notes (Signed)
LTM EEG discontinued - no skin breakdown at unhook.   

## 2022-03-06 NOTE — Progress Notes (Signed)
Subjective: NAEO. Was eating breakfast with help of sitter.   ROS: Unable to obtain due to poor mental status  Examination  Vital signs in last 24 hours: Temp:  [97.5 F (36.4 C)-98.5 F (36.9 C)] 98.5 F (36.9 C) (05/19 1152) Pulse Rate:  [63-106] 106 (05/19 1152) Resp:  [16-20] 18 (05/19 1152) BP: (115-149)/(75-114) 115/79 (05/19 1152) SpO2:  [96 %-100 %] 98 % (05/19 1152)  General: lying in bed, NAD Neuro: awake, oriented to slef, able to say hospital when given two options, follows simple commands, able to name objects like watch and thumb, but keeps perseverating about being in 10 pain in her head, PERLA, EOMI, no facial asymmetry, 4/5 in all extremities  Basic Metabolic Panel: Recent Labs  Lab 03/04/22 0841 03/04/22 0843 03/04/22 1559 03/05/22 0640 03/06/22 0319  NA 141 139 140 141 138  K 2.8* 2.8* 3.2* 3.4* 3.1*  CL 106 103 104 105 102  CO2 25  --  25 26 25   GLUCOSE 111* 109* 94 83 75  BUN <5* <3* <5* <5* 3*  CREATININE 0.58 0.50 0.54 0.52 0.44  CALCIUM 9.1  --  9.0 9.2 8.5*  MG  --   --  1.7 2.1 1.8  PHOS  --   --   --  2.2* 3.8    CBC: Recent Labs  Lab 03/04/22 0841 03/04/22 0843  WBC 4.2  --   NEUTROABS 2.2  --   HGB 13.2 13.9  HCT 40.6 41.0  MCV 105.7*  --   PLT 180  --      Coagulation Studies: Recent Labs    03/04/22 0841  LABPROT 13.0  INR 1.0    Imaging MR brain w and wo pending  ASSESSMENT AND PLAN: 66 year old female with alcohol use disorder and history of seizures who presented with right-sided weakness after seizure at home.   Breakthrough seizure Todd's palsy, resolved Alcohol use disorder Agitation, improving Acute encephalopathy, due to seizure and medications -Etiology of breakthrough seizures: Patient is not on any AEDs other than gabapentin.  Alcohol level on arrival was less than 10.  Urine drug screen was negative except for benzos which were administered to patient.  Urine analysis is pending. -No definite seizures  overnight but continues to have intermittent rhythmic delta activity in right posterior temporal region with high risk of seizure recurrence   Recommendations -Continue Ativan 1mg  every 6 hours.  We will plan to taper off as EEG and patient's mental status improves -DC LTM EEG today and obtain MRI brain w and wo contrast to lok for acute abnormality -Continue CIWA protocol, thiamine -Continue seizure precautions As needed IV Ativan 2 mg for seizure-like activity -Management of rest of comorbidities per primary team -Plan discussed with medicine team   I have spent a total of  35 minutes with the patient reviewing hospital notes,  test results, labs and examining the patient as well as establishing an assessment and plan.  > 50% of time was spent in direct patient care.  Zeb Comfort Epilepsy [facility] For questions after 5pm please refer to AMION to reach the Neurologist on call

## 2022-03-06 NOTE — Procedures (Addendum)
Patient Name: [name]  MRN: [id number]  Epilepsy Attending: Lora Havens  Referring Physician/Provider: Kerney Elbe, MD Duration: 03/05/2022 1145 to 03/06/2022 1035   Patient history: 67 year old female with EtOH dependency and a history of seizures as well as EtOH withdrawal, presenting via EMS as a Code Stroke for evaluation of right sided weakness after a seizure at home.  EEG to evaluate for seizure   Level of alertness: awake, asleep    AEDs during EEG study: Ativan   Technical aspects: This EEG study was done with scalp electrodes positioned according to the 10-20 International system of electrode placement. Electrical activity was acquired at a sampling rate of 500Hz  and reviewed with a high frequency filter of 70Hz  and a low frequency filter of 1Hz . EEG data were recorded continuously and digitally stored.    Description: No clear posterior dominant rhythm was seen.  Sleep was characterized by vertex waves, sleep spindles (12 to 14 Hz), maximal frontocentral region. EEG also showed continuous and maximal left posterior temporal region 3 to 5 Hz theta-delta slowing in left hemisphere which at times appear rhythmic.  Sharp waves are also noted in left posterior temporal region which at times appeared quasiperiodic at 1 Hz.  There is also excessive 13 to 15 Hz generalized beta activity.  Hyperventilation and photic stimulation were not performed.      ABNORMALITY -Sharp wave, left posterior temporal region -Intermittent rhythmic delta slowing, left posterior temporal region -Continuous slow, left hemisphere, maximal left posterior temporal region -Excessive beta, generalized   IMPRESSION: This study showed evidence of epileptogenicity in left posterior temporal region. There is cortical dysfunction in left hemisphere, maximal left posterior temporal region likely secondary to underlying structural abnormality and high risk of seizure recurrence.     Additionally there is  moderate diffuse encephalopathy, nonspecific to etiology. The excessive beta activity is most likely due to benzodiazepine use and is a benign EEG pattern.  No definite seizures were seen during the study.     Denorris Reust Barbra Sarks

## 2022-03-06 NOTE — Progress Notes (Signed)
Was called by MRI dept that pt was unable to sit still for the las 15 min and was asked if I could medicate her some more..... 2 mg of Ativan given.

## 2022-03-06 NOTE — TOC Initial Note (Addendum)
Transition of Care [facility]) - Initial/Assessment Note    Patient Details  Name: [name] MRN: [id number] Date of Birth: 1955/10/31  Transition of Care [facility] CM/SW Contact:    Pollie Friar, RN Phone Number: 03/06/2022, 1:38 PM  Clinical Narrative:                 CM met with the patient and she is still showing some confusion. CM asked her about reaching out to her family/ friends and she was in agreement. Daughter and SO numbers do not work. CM was able to talk with her friend: [name]. [name] says the patient and her SO live at [facility] and drink a lot. Patient has tried to cut back more recently.  Pt uses taxis for transportation. Will ask for PT/OT assessment from MD.  [name] is willing to check on her more frequently at d/c.  TOC following.  Expected Discharge Plan: Home/Self Care Barriers to Discharge: Continued Medical Work up   Patient Goals and CMS Choice        Expected Discharge Plan and Services Expected Discharge Plan: Home/Self Care   Discharge Planning Services: CM Consult   Living arrangements for the past 2 months: Hotel/Motel                                      Prior Living Arrangements/Services Living arrangements for the past 2 months: Hotel/Motel Lives with:: Significant Other Patient language and need for interpreter reviewed:: Yes Do you feel safe going back to the place where you live?: Yes      Need for Family Participation in Patient Care: Yes (Comment)     Criminal Activity/Legal Involvement Pertinent to Current Situation/Hospitalization: No - Comment as needed  Activities of Daily Living      Permission Sought/Granted                  Emotional Assessment Appearance:: Appears stated age Attitude/Demeanor/Rapport: Other (comment) (slow to answer) Affect (typically observed): Accepting Orientation: : Oriented to Self   Psych Involvement: No (comment)  Admission diagnosis:  Seizure ([place]) [R56.9] Right  sided weakness [R53.1] Patient Active Problem List   Diagnosis Date Noted   Seizure ([place]) 03/04/2022   Alcohol-induced mood disorder ([place])    Closed displaced oblique fracture of shaft of left humerus 08/01/2020   Diverticulitis 02/29/2020   Acute diverticulitis 02/28/2020   GERD (gastroesophageal reflux disease) 02/28/2020   Suicidal ideation    Alcohol withdrawal syndrome, with delirium ([place]) 12/13/2018   Thrombocytopenia ([place]) 12/13/2018   Homeless 12/13/2018   Depression 12/13/2018   Neuropathy 12/13/2018   Subarachnoid hemorrhage ([place]) 12/11/2018   Seizure due to alcohol withdrawal ([place]) 12/11/2018   Alcohol dependence with alcohol-induced mood disorder ([place])    MDD (major depressive disorder), recurrent severe, without psychosis ([place]) 10/12/2018   Major depressive disorder, 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 psychotic features ([place]) 10/12/2018   Alcohol use disorder, severe, dependence ([place]) 05/16/2018   PCP:  Care, [place] Primary Pharmacy:   [id number] [place] [place] [address]Phone:: [phone]:: [phone]    Social Determinants of Health (SDOH) Interventions    Readmission Risk Interventions     View : No data to display.

## 2022-03-07 DIAGNOSIS — F10931 Alcohol use, unspecified with withdrawal delirium: Secondary | ICD-10-CM | POA: Diagnosis not present

## 2022-03-07 DIAGNOSIS — R531 Weakness: Secondary | ICD-10-CM | POA: Diagnosis not present

## 2022-03-07 DIAGNOSIS — R569 Unspecified convulsions: Secondary | ICD-10-CM | POA: Diagnosis not present

## 2022-03-07 LAB — BASIC METABOLIC PANEL
Anion gap: 7 (ref 5–15)
BUN: 6 mg/dL — ABNORMAL LOW (ref 8–23)
CO2: 23 mmol/L (ref 22–32)
Calcium: 9 mg/dL (ref 8.9–10.3)
Chloride: 105 mmol/L (ref 98–111)
Creatinine, Ser: 0.55 mg/dL (ref 0.44–1.00)
GFR, Estimated: >60 mL/min (ref >60)
Glucose, Bld: 99 mg/dL (ref 70–99)
Potassium: 3.7 mmol/L (ref 3.5–5.1)
Sodium: 135 mmol/L (ref 135–145)

## 2022-03-07 LAB — PHOSPHORUS: Phosphorus: 3.7 mg/dL (ref 2.5–4.6)

## 2022-03-07 LAB — MAGNESIUM: Magnesium: 1.9 mg/dL (ref 1.7–2.4)

## 2022-03-07 MED ORDER — OXYCODONE-ACETAMINOPHEN 5-325 MG PO TABS
1.0000 | ORAL_TABLET | ORAL | Status: DC | PRN
  Administered 2022-03-07 – 2022-03-08 (×3): 2 via ORAL
  Filled 2022-03-07 (×3): qty 2

## 2022-03-07 MED ORDER — LORAZEPAM 1 MG PO TABS
1.0000 mg | ORAL_TABLET | Freq: Once | ORAL | Status: AC
  Administered 2022-03-07: 1 mg via ORAL
  Filled 2022-03-07: qty 1

## 2022-03-07 NOTE — Evaluation (Signed)
Occupational Therapy Evaluation Patient Details Name: [name] MRN: [id number] DOB: 12/26/1955 Today's Date: 03/07/2022   History of Present Illness Pt is 66 yo female who presents with seizures upon trying to decrease her EtOH intake. CT unremarkable. EEG showed L temporal focal motor seizure. PMH: EtOH abuse.   Clinical Impression   Pt dependent on her significant other at baseline for ADLs and mobility, has used RW in the past. Pt questionable historian, difficulty with word finding. Pt initially stating she lived in an apartment, however with further discussion lived in an extended stay motel, and was "living in the woods" prior to that. Pt currently mod-max A for ADLs and mod A +2 for bed mobility. Pt with decreased initiation and slow processing with tasks during session, needing increased verbal cues. Pt disoriented to time and situation, thinking it is 72 and unsure if she is in [place]. Pt presenting with impairments listed below, will follow acutely. Recommend SNF at d/c.     Recommendations for follow up therapy are one component of a multi-disciplinary discharge planning process, led by the attending physician.  Recommendations may be updated based on patient status, additional functional criteria and insurance authorization.   Follow Up Recommendations  Skilled nursing-short term rehab (<3 hours/day)    Assistance Recommended at Discharge Frequent or constant Supervision/Assistance  Patient can return home with the following A lot of help with walking and/or transfers;A lot of help with bathing/dressing/bathroom;Assistance with cooking/housework;Direct supervision/assist for medications management;Direct supervision/assist for financial management;Assist for transportation;Help with stairs or ramp for entrance    Functional Status Assessment  Patient has had a recent decline in their functional status and demonstrates the ability to make significant improvements in  function in a reasonable and predictable amount of time.  Equipment Recommendations  None recommended by OT;Other (comment) (defer to next venue of care)    Recommendations for Other Services       Precautions / Restrictions Precautions Precautions: Fall Precaution Comments: pt has sitter Restrictions Weight Bearing Restrictions: No      Mobility Bed Mobility               General bed mobility comments: pt received in chair    Transfers Overall transfer level: Needs assistance Equipment used: 2 person hand held assist Transfers: Sit to/from Stand Sit to Stand: Mod assist, +2 safety/equipment                  Balance Overall balance assessment: Needs assistance Sitting-balance support: Feet supported, No upper extremity supported Sitting balance-Leahy Scale: Fair Sitting balance - Comments: able to maintain balance sitting edge of chair, but unable to correct for challenges   Standing balance support: Bilateral upper extremity supported, During functional activity, Reliant on assistive device for balance Standing balance-Leahy Scale: Poor Standing balance comment: reliant on 1 person/RW support                           ADL either performed or assessed with clinical judgement   ADL Overall ADL's : Needs assistance/impaired Eating/Feeding: Minimal assistance;Sitting   Grooming: Minimal assistance;Sitting   Upper Body Bathing: Moderate assistance;Sitting   Lower Body Bathing: Moderate assistance;Sitting/lateral leans;Sit to/from stand   Upper Body Dressing : Sitting;Moderate assistance   Lower Body Dressing: Maximal assistance;Sitting/lateral leans   Toilet Transfer: Moderate assistance;+2 for physical assistance;Rolling walker (2 wheels);Ambulation;Regular Glass blower/designer Details (indicate cue type and reason): simulated in room Toileting- Clothing Manipulation and Hygiene: Sitting/lateral lean;Moderate assistance  Functional mobility during ADLs: Moderate assistance;+2 for physical assistance       Vision Baseline Vision/History: 1 Wears glasses Additional Comments: WFL for basic tasks, not formally assessed     Perception     Praxis      Pertinent Vitals/Pain Pain Assessment Pain Assessment: Faces Pain Score: 6  Pain Location: neck, arms, R foot Pain Descriptors / Indicators: Aching, Sore Pain Intervention(s): Limited activity within patient's tolerance, Monitored during session     Hand Dominance Right   Extremity/Trunk Assessment Upper Extremity Assessment Upper Extremity Assessment: Generalized weakness (decreased ROM R>L, can flex shoulders to ~85* , 3/5 grasp strength bilaterally, pain with R shoulder flexion)   Lower Extremity Assessment Lower Extremity Assessment: Defer to PT evaluation RLE Deficits / Details: she reports she had screws in R foot and then they were removed 3 years ago and she has had pain in that foot since then RLE Sensation: history of peripheral neuropathy RLE Coordination: decreased gross motor   Cervical / Trunk Assessment Cervical / Trunk Assessment: Kyphotic   Communication Communication Communication: Expressive difficulties (some word salad)   Cognition Arousal/Alertness: Awake/alert Behavior During Therapy: Flat affect Overall Cognitive Status: Impaired/Different from baseline Area of Impairment: Orientation, Attention, Memory, Following commands, Safety/judgement, Awareness, Problem solving                 Orientation Level: Disoriented to, Time Current Attention Level: Sustained Memory: Decreased short-term memory Following Commands: Follows one step commands with increased time Safety/Judgement: Decreased awareness of safety, Decreased awareness of deficits Awareness: Intellectual Problem Solving: Slow processing, Requires verbal cues, Requires tactile cues, Difficulty sequencing General Comments: pt states that it is 1973. She  can state where she is but questions whether she is right later on. Frequently mixes up words. Slow to speak and process info. Decreased body awareness and awareness of safety.     General Comments  VSS on RA, pt has sitter    Exercises     Shoulder Instructions      Home Living Family/patient expects to be discharged to:: Unsure                                 Additional Comments: initially pt reporting she lives in an apartment, then reports she has been living at the Extended Stay hotal, prior to that she reports she was staying in a tent in the woods because they didn't have any rooms. She reports that she stays with her fiance and they are both on disability. All info questionable though as pt with STM deficits and confused      Prior Functioning/Environment Prior Level of Function : Needs assist             Mobility Comments: pt reports she has used RW in past and that she cannot ambulate without assist from her fiance ADLs Comments: reports that fiance assists her to bathe and dress        OT Problem List: Decreased strength;Decreased range of motion;Decreased activity tolerance;Impaired balance (sitting and/or standing);Decreased coordination;Decreased cognition;Decreased safety awareness;Decreased knowledge of use of DME or AE;Impaired UE functional use      OT Treatment/Interventions: Self-care/ADL training;Therapeutic exercise;Therapeutic activities;DME and/or AE instruction;Visual/perceptual remediation/compensation;Patient/family education;Balance training;Cognitive remediation/compensation    OT Goals(Current goals can be found in the care plan section) Acute Rehab OT Goals Patient Stated Goal: none stated OT Goal Formulation: With patient Time For Goal Achievement: 03/21/22 Potential to Achieve Goals:  Good ADL Goals Pt Will Perform Upper Body Dressing: with min assist;sitting;with caregiver independent in assisting Pt Will Perform Lower Body  Dressing: with min assist;sit to/from stand;sitting/lateral leans;with adaptive equipment;with caregiver independent in assisting Pt Will Transfer to Toilet: with min guard assist;ambulating;regular height toilet Pt Will Perform Tub/Shower Transfer: Shower transfer;Tub transfer;shower seat;rolling walker;ambulating;with mod assist Additional ADL Goal #1: Pt will follow 2 step instruction with 75% accuracy in prep for ADLs  OT Frequency: Min 2X/week    Co-evaluation PT/OT/SLP Co-Evaluation/Treatment: Yes Reason for Co-Treatment: Complexity of the patient's impairments (multi-system involvement) PT goals addressed during session: Mobility/safety with mobility;Balance;Proper use of DME OT goals addressed during session: ADL's and self-care;Proper use of Adaptive equipment and DME      AM-PAC OT "6 Clicks" Daily Activity     Outcome Measure Help from another person eating meals?: A Little Help from another person taking care of personal grooming?: A Little Help from another person toileting, which includes using toliet, bedpan, or urinal?: A Lot Help from another person bathing (including washing, rinsing, drying)?: A Lot Help from another person to put on and taking off regular upper body clothing?: A Lot Help from another person to put on and taking off regular lower body clothing?: A Lot 6 Click Score: 14   End of Session Equipment Utilized During Treatment: Gait belt;Rolling walker (2 wheels) Nurse Communication: Mobility status  Activity Tolerance: Patient tolerated treatment well Patient left: in chair;with call bell/phone within reach;with chair alarm set;with nursing/sitter in room  OT Visit Diagnosis: Unsteadiness on feet (R26.81);Other abnormalities of gait and mobility (R26.89);Muscle weakness (generalized) (M62.81);Other symptoms and signs involving cognitive function;Other symptoms and signs involving the nervous system (R29.898)                Time: [id number] OT Time  Calculation (min): 25 min Charges:  OT General Charges $OT Visit: 1 Visit OT Evaluation $OT Eval Moderate Complexity: 1 [address], OTD, OTR/L Acute Rehab [phone]) [phone] - [place] 03/07/2022, 12:19 PM

## 2022-03-07 NOTE — Progress Notes (Signed)
Subjective: Awake and interacting with PT.   Objective: Current vital signs: BP 116/79 (BP Location: Left Arm)   Pulse 77   Temp 98.3 F (36.8 C) (Oral)   Resp 17   SpO2 98%  Vital signs in last 24 hours: Temp:  [97.4 F (36.3 C)-98.6 F (37 C)] 98.3 F (36.8 C) (05/20 0726) Pulse Rate:  [63-106] 77 (05/20 0726) Resp:  [14-20] 17 (05/20 0726) BP: (99-134)/(68-79) 116/79 (05/20 0726) SpO2:  [97 %-100 %] 98 % (05/20 0726)  Intake/Output from previous day: 05/19 0701 - 05/20 0700 In: 520 [P.O.:520] Out: 250 [Urine:250] Intake/Output this shift: No intake/output data recorded. Nutritional status:  Diet Order             Diet regular Room service appropriate? Yes; Fluid consistency: Thin  Diet effective now                  HEENT: Foley/AT Lungs: Respirations unlabored Ext: Bruising to BUE from what patient states was a fall previously   Neurologic Exam: Ment: Awake, oriented to self, able to say hospital on one request but then did not answer when asked the same question later. Identifies the city as [place] and then appears to be in doubt of her own answer. Speech is fluent but slow and tangential. Follows simple commands. CN: PERRL. EOMI. Face symmetric.  Motor: 5/5 x 4 Sensory: Intact to touch x 4.  Cerebellar: No ataxia noted.  Gait: Ambulates with assistance. Gait is unsteady, requiring supervision.    Lab Results: Results for orders placed or performed during the hospital encounter of 03/04/22 (from the past 48 hour(s))  Urine rapid drug screen (hosp performed)     Status: Abnormal   Collection Time: 03/05/22  7:36 AM  Result Value Ref Range   Opiates NONE DETECTED NONE DETECTED   Cocaine NONE DETECTED NONE DETECTED   Benzodiazepines POSITIVE (A) NONE DETECTED   Amphetamines NONE DETECTED NONE DETECTED   Tetrahydrocannabinol NONE DETECTED NONE DETECTED   Barbiturates NONE DETECTED NONE DETECTED    Comment: (NOTE) DRUG SCREEN FOR MEDICAL PURPOSES ONLY.   IF CONFIRMATION IS NEEDED FOR ANY PURPOSE, NOTIFY LAB WITHIN 5 DAYS.  LOWEST DETECTABLE LIMITS FOR URINE DRUG SCREEN Drug Class                     Cutoff (ng/mL) Amphetamine and metabolites    1000 Barbiturate and metabolites    200 Benzodiazepine                 200 Tricyclics and metabolites     300 Opiates and metabolites        300 Cocaine and metabolites        300 THC                            50 Performed at [facility], [address] [place], [place] [address]   Urinalysis, Routine w reflex microscopic Urine, Clean Catch     Status: None   Collection Time: 03/05/22  7:36 AM  Result Value Ref Range   Color, Urine YELLOW YELLOW   APPearance CLEAR CLEAR   Specific Gravity, Urine 1.012 1.005 - 1.030   pH 8.0 5.0 - 8.0   Glucose, UA NEGATIVE NEGATIVE mg/dL   Hgb urine dipstick NEGATIVE NEGATIVE   Bilirubin Urine NEGATIVE NEGATIVE   Ketones, ur NEGATIVE NEGATIVE mg/dL   Protein, ur NEGATIVE NEGATIVE mg/dL  Nitrite NEGATIVE NEGATIVE   Leukocytes,Ua NEGATIVE NEGATIVE    Comment: Performed at [facility], [address] [place], [place] [address]  Basic metabolic panel     Status: Abnormal   Collection Time: 03/06/22  3:19 AM  Result Value Ref Range   Sodium 138 135 - 145 mmol/L   Potassium 3.1 (L) 3.5 - 5.1 mmol/L    Comment: SLIGHT HEMOLYSIS   Chloride 102 98 - 111 mmol/L   CO2 25 22 - 32 mmol/L   Glucose, Bld 75 70 - 99 mg/dL    Comment: Glucose reference range applies only to samples taken after fasting for at least 8 hours.   BUN 3 (L) 8 - 23 mg/dL   Creatinine, Ser 0.37 0.44 - 1.00 mg/dL   Calcium 8.5 (L) 8.9 - 10.3 mg/dL   GFR, Estimated >04 >88 mL/min    Comment: (NOTE) Calculated using the CKD-EPI Creatinine Equation (2021)    Anion gap 11 5 - 15    Comment: Performed at [facility] Lab, [address] [place], [place] [address]  Magnesium     Status: None   Collection Time: 03/06/22  3:19 AM  Result Value Ref Range   Magnesium  1.8 1.7 - 2.4 mg/dL    Comment: Performed at [facility], [address], [place], [place] [address]  Phosphorus     Status: None   Collection Time: 03/06/22  3:19 AM  Result Value Ref Range   Phosphorus 3.8 2.5 - 4.6 mg/dL    Comment: Performed at [facility], [address] [place], [place] [address]  Basic metabolic panel     Status: Abnormal   Collection Time: 03/07/22  2:57 AM  Result Value Ref Range   Sodium 135 135 - 145 mmol/L   Potassium 3.7 3.5 - 5.1 mmol/L   Chloride 105 98 - 111 mmol/L   CO2 23 22 - 32 mmol/L   Glucose, Bld 99 70 - 99 mg/dL    Comment: Glucose reference range applies only to samples taken after fasting for at least 8 hours.   BUN 6 (L) 8 - 23 mg/dL   Creatinine, Ser 0.34 0.44 - 1.00 mg/dL   Calcium 9.0 8.9 - 91.7 mg/dL   GFR, Estimated >91 >50 mL/min    Comment: (NOTE) Calculated using the CKD-EPI Creatinine Equation (2021)    Anion gap 7 5 - 15    Comment: Performed at [facility] Lab, [address] [place], [place] [address]  Magnesium     Status: None   Collection Time: 03/07/22  2:57 AM  Result Value Ref Range   Magnesium 1.9 1.7 - 2.4 mg/dL    Comment: Performed at [facility], [address], [place], [place] [address]  Phosphorus     Status: None   Collection Time: 03/07/22  2:57 AM  Result Value Ref Range   Phosphorus 3.7 2.5 - 4.6 mg/dL    Comment: Performed at [facility], [address] [place], [place] [address]    Recent Results (from the past 240 hour(s))  Resp Panel by RT-PCR (Flu A&B, Covid) Nasopharyngeal Swab     Status: None   Collection Time: 03/04/22  8:10 AM   Specimen: Nasopharyngeal Swab; Nasopharyngeal(NP) swabs in vial transport medium  Result Value Ref Range Status   SARS Coronavirus 2 by RT PCR NEGATIVE NEGATIVE Final    Comment: (NOTE) SARS-CoV-2 target nucleic acids are NOT DETECTED.  The SARS-CoV-2 RNA is generally detectable in upper respiratory specimens during the acute  phase of infection. The lowest concentration of SARS-CoV-2 viral copies this assay can detect is 138 copies/mL. A negative result does not preclude SARS-Cov-2 infection and should not be used as the sole basis for treatment or other patient management decisions. A negative result may occur with  improper specimen collection/handling, submission of specimen other than nasopharyngeal swab, presence of viral mutation(s) within the areas targeted by this assay, and inadequate number of viral copies(<138 copies/mL). A negative result must be combined with clinical observations, patient history, and epidemiological information. The expected result is Negative.  Fact Sheet for Patients:  BloggerCourse.com  Fact Sheet for Healthcare Providers:  SeriousBroker.it  This test is no t yet approved or cleared by the [place] FDA and  has been authorized for detection and/or diagnosis of SARS-CoV-2 by FDA under an Emergency Use Authorization (EUA). This EUA will remain  in effect (meaning this test can be used) for the duration of the COVID-19 declaration under Section 564(b)(1) of the Act, 21 U.S.C.section 360bbb-3(b)(1), unless the authorization is terminated  or revoked sooner.       Influenza A by PCR NEGATIVE NEGATIVE Final   Influenza B by PCR NEGATIVE NEGATIVE Final    Comment: (NOTE) The Xpert Xpress SARS-CoV-2/FLU/RSV plus assay is intended as an aid in the diagnosis of influenza from Nasopharyngeal swab specimens and should not be used as a sole basis for treatment. Nasal washings and aspirates are unacceptable for Xpert Xpress SARS-CoV-2/FLU/RSV testing.  Fact Sheet for Patients: BloggerCourse.com  Fact Sheet for Healthcare Providers: SeriousBroker.it  This test is not yet approved or cleared by the [place] FDA and has been authorized for detection and/or diagnosis of  SARS-CoV-2 by FDA under an Emergency Use Authorization (EUA). This EUA will remain in effect (meaning this test can be used) for the duration of the COVID-19 declaration under Section 564(b)(1) of the Act, 21 U.S.C. section 360bbb-3(b)(1), unless the authorization is terminated or revoked.  Performed at [facility], [address] [place], [place] [address]     Lipid Panel No results for input(s): CHOL, TRIG, HDL, CHOLHDL, VLDL, LDLCALC in the last 72 hours.  Studies/Results: MR BRAIN WO CONTRAST  Result Date: 03/06/2022 CLINICAL DATA:  Seizure disorder, right-sided weakness EXAM: MRI HEAD WITHOUT CONTRAST TECHNIQUE: Multiplanar, multiecho pulse sequences of the brain and surrounding structures were obtained without intravenous contrast. COMPARISON:  No prior MRI trauma correlation is made with CT head 03/04/2022 FINDINGS: Evaluation is limited by motion and is further limited as the patient declined to complete the study. Only axial diffusion-weighted sequences, T2, FLAIR, and SWI sequences and sagittal T1 sequence were obtained. Brain: No restricted diffusion to suggest acute or subacute infarct. No acute hemorrhage, mass, mass effect, or midline shift. No hydrocephalus or extra-axial collection. No hemosiderin deposition to suggest remote hemorrhage. Vascular: Normal arterial flow voids. Skull and upper cervical spine: Normal marrow signal on available sequences. Sinuses/Orbits: Mucosal thickening in the maxillary sinuses and ethmoid air cells. The orbits are unremarkable. Other: Fluid in the left mastoid air cells. IMPRESSION: Evaluation is limited by motion and study incompleteness. Within this limitation, no acute intracranial process. No evidence of acute or subacute infarct Electronically Signed   By: Wiliam Ke M.D.   On: 03/06/2022 17:45    Medications: Scheduled:  enoxaparin (LOVENOX) injection  40 mg Subcutaneous Q24H   LORazepam  1 mg Intravenous Q6H   thiamine  100  mg  Oral Daily   Or   thiamine  100 mg Intravenous Daily   Assessment: 66 year old female with alcohol use disorder and history of alcohol withdrawal seizures who presented on 5/17 with right-sided weakness after a seizure at home. CTP on initial presentation revealed left cerebral hemisphere increased signal on CBV map but no delayed perfusion, findings being most consistent with focal post-seizure hyperemia. EEG was then obtained which showed  2 seizures lasting 3 and 2 minutes respectively, arising from the left temporal region; EEG also showed cortical dysfunction in the left hemisphere likely secondary to underlying structural abnormality, on a background of generalized slowing. She was started on scheduled Ativan for EtOH withdrawal.  - Exam today reveals: No significant change from yesterday's documented exam. Cognitively impaired with poor orientation.  - MRI brain without contrast: Evaluation is limited by motion and study incompleteness. Within this limitation, no acute intracranial process. No evidence of acute or subacute infarct - Breakthrough seizure with Todd's palsy, resolved - Alcohol use disorder - Agitation, improving - Acute encephalopathy, due to seizure and medications - Etiology of breakthrough seizures: Most likely due to EtOH withdrawal. Alcohol level on arrival was less than 10. Patient is not on any AEDs other than gabapentin. Urine drug screen was negative except for benzos which had been administered to patient.   - Urinalysis negative.  - LTM yesterday revealed no definite seizures overnight but she continued to have intermittent rhythmic delta activity in the right posterior temporal region with high risk of seizure recurrence. LTM was discontinued yesterday in order to obtain MRI brain.    Recommendations: - Continue Ativan 1mg  every 6 hours.  We will plan to taper off as patient's mental status improves - Continue CIWA protocol, thiamine - Restart her home Neurontin -  Continue seizure precautions - As needed IV Ativan 2 mg for seizure-like activity - Management of rest of comorbidities per primary team - Neurohospitalist team will sign off. Please call if there are additional questions.     LOS: 3 days   @Electronically  signed: Dr. Caryl PinaEric Clyde Upshaw@ 03/07/2022  7:32 AM

## 2022-03-07 NOTE — Plan of Care (Signed)

## 2022-03-07 NOTE — Progress Notes (Signed)
   Subjective: Patient was seen at bedside during rounds this morning.  She is alert and continues to note some pain in her neck (she state she had a cervical spine fusion in the past). Other than her pain she states she has lost her pocketbook and has not been able to get in contact with her husband. Per nursing she has had no seizure activity.   Objective:  Vital signs in last 24 hours: Vitals:   03/07/22 0025 03/07/22 0438 03/07/22 0726 03/07/22 1248  BP: 113/79 130/79 116/79 114/78  Pulse: 73 64 77 79  Resp: 16 18 17 19   Temp: 98.6 F (37 C) (!) 97.4 F (36.3 C) 98.3 F (36.8 C) 97.7 F (36.5 C)  TempSrc: Oral Oral Oral Oral  SpO2: 98% 97% 98% 100%   General: NAD, nl appearance HE: Normocephalic, atraumatic, EOMI, Conjunctivae normal ENT: No congestion, no rhinorrhea, no exudate or erythema  Cardiovascular: Normal rate, regular rhythm. No murmurs, rubs, or gallops Pulmonary: Effort normal, breath sounds normal. No wheezes, rales, or rhonchi Abdominal: soft, nontender, bowel sounds present Musculoskeletal: no swelling, deformity, injury or tenderness in extremities Skin: Warm, dry, no bruising, erythema, or rash Neuro: Alert and oriented to person only says that she is in [place] at [employer], she does not know the year, states the president is Obama, when asked how much money she has with a nickel quarter and a dime she says a dime. No other focal deficits appreciated. No seizure activity.  Psychiatric/Behavioral: normal mood, normal behavior      Assessment/Plan:  Principal Problem:   Seizure (HCC) Active Problems:   Alcohol withdrawal syndrome, with delirium (HCC)   #Alcohol withdrawal seizures Patient with recent abrupt discontinuation of alcohol intake at home. Stroke work-up was unrevealing.  The patient has been more alert over the past few days though not quite at her baseline and has stated she continues to feel confused. MRI of the brain was obtained which  showed no evidence of acute pathology. EEG yesterday showed slowing in left posterior temporal region. Neurology planned to hold off on further EEG if MRI brain normal and they have now signed off.  - Appreciate neurology consult and recommendations - MRI pending - CIWA with ativan - PRN ativan for seizure activity  - Thiamine  - Frequent Neuro checks   #Hypokalemia,Resolved Patient presented here with potassium of 2.8.  Normokalemic this AM.  -Trend BMP -Repleting electrolytes as needed  Prior to Admission Living Arrangement: In a hotel with significant other Barriers to Discharge: Medical stability, unable to speak with SO or daughter listed in chart.  Dispo: Anticipated discharge pending medical stability, c/s TOC for assistance with placemnent   , MD 03/07/2022, 12:56 PM Pager: [phone]  After 5pm on weekdays and 1pm on weekends: On Call pager [phone]

## 2022-03-07 NOTE — Progress Notes (Signed)
A consult was placed to IV Therapy to restart the pt's iv for medication;  pt has multiple bruises noted bilaterally;  will have another IV RN assess with ultrasound when able.

## 2022-03-07 NOTE — Evaluation (Signed)
Physical Therapy Evaluation Patient Details Name: [name] MRN: [id number] DOB: 1956/01/11 Today's Date: 03/07/2022  History of Present Illness  Pt is 66 yo female who presents with seizures upon trying to decrease her EtOH intake. CT unremarkable. EEG showed L temporal focal motor seizure. PMH: EtOH abuse.  Clinical Impression  Pt admitted with above diagnosis. Pt is confused on eval so PLOF questionable. Seems though that she was living at [facility] with her fiance and prior to that was living in a [place] in the woods. She reports that her fiance has to help her ambulate, bathe, and dress. She is a poor historian and is having some word finding difficulties along with "word salad". Pt c/o pain in neck, BUE's, and R foot which she reports is daily for her. She required mod A +2 to ambulate with RW and maintained a fwd flexed posture with unsafe, shuffling steps despite verbal and tactile cues. Recommend SNF at d/c.  Pt currently with functional limitations due to the deficits listed below (see PT Problem List). Pt will benefit from skilled PT to increase their independence and safety with mobility to allow discharge to the venue listed below.          Recommendations for follow up therapy are one component of a multi-disciplinary discharge planning process, led by the attending physician.  Recommendations may be updated based on patient status, additional functional criteria and insurance authorization.  Follow Up Recommendations Skilled nursing-short term rehab (<3 hours/day)    Assistance Recommended at Discharge Frequent or constant Supervision/Assistance  Patient can return home with the following  A lot of help with walking and/or transfers;A lot of help with bathing/dressing/bathroom;Assistance with cooking/housework;Direct supervision/assist for medications management;Direct supervision/assist for financial management;Assist for transportation;Help with stairs or ramp for  entrance    Equipment Recommendations None recommended by PT  Recommendations for Other Services       Functional Status Assessment Patient has had a recent decline in their functional status and demonstrates the ability to make significant improvements in function in a reasonable and predictable amount of time.     Precautions / Restrictions Precautions Precautions: Fall Restrictions Weight Bearing Restrictions: No      Mobility  Bed Mobility               General bed mobility comments: pt received in chair    Transfers Overall transfer level: Needs assistance Equipment used: 2 person hand held assist Transfers: Sit to/from Stand Sit to Stand: Mod assist, +2 safety/equipment           General transfer comment: pt unsteady, needed UE support, all mvmts very slow    Ambulation/Gait Ambulation/Gait assistance: Mod assist, +2 safety/equipment, +2 physical assistance Gait Distance (Feet): 40 Feet Assistive device: Rolling walker (2 wheels) Gait Pattern/deviations: Shuffle, Trunk flexed Gait velocity: decreased Gait velocity interpretation: <1.31 ft/sec, indicative of household ambulator   General Gait Details: pt with very small, shuffling steps. Trunk flexed with RW too far fwd but could not understand the concept of standing within it. Pt hit door frame on L leaving room and needed assist to correct. Very unsafe with mobility  Stairs            Wheelchair Mobility    Modified Rankin (Stroke Patients Only)       Balance Overall balance assessment: Needs assistance Sitting-balance support: Feet supported, No upper extremity supported Sitting balance-Leahy Scale: Fair Sitting balance - Comments: able to maintain balance sitting edge of chair, but unable to  correct for challenges   Standing balance support: Bilateral upper extremity supported, During functional activity, Reliant on assistive device for balance Standing balance-Leahy Scale:  Poor Standing balance comment: heavily reliant on UE support as well as needing external assist                             Pertinent Vitals/Pain Pain Assessment Pain Assessment: Faces Faces Pain Scale: Hurts even more Pain Location: neck, arms, R foot Pain Descriptors / Indicators: Aching, Sore Pain Intervention(s): Limited activity within patient's tolerance, Monitored during session    Home Living Family/patient expects to be discharged to:: Unsure                   Additional Comments: pt has been living at the [facility], prior to that she reports she was staying in a tent in the woods because they didn't have any rooms. She reports that she stays with her fiance and they are both on disability. All info questionable though as pt with STM deficits and confused    Prior Function Prior Level of Function : Needs assist             Mobility Comments: pt reports she has used RW in past and that she cannot ambulate without assist from her fiance ADLs Comments: reports that fiance assists her to bathe and dress     Hand Dominance   Dominant Hand: Right    Extremity/Trunk Assessment   Upper Extremity Assessment Upper Extremity Assessment: Defer to OT evaluation    Lower Extremity Assessment Lower Extremity Assessment: Generalized weakness;RLE deficits/detail RLE Deficits / Details: she reports she had screws in R foot and then they were removed 3 years ago and she has had pain in that foot since then RLE Sensation: history of peripheral neuropathy RLE Coordination: decreased gross motor    Cervical / Trunk Assessment Cervical / Trunk Assessment: Kyphotic  Communication   Communication: Expressive difficulties (some word salad)  Cognition Arousal/Alertness: Awake/alert Behavior During Therapy: Flat affect Overall Cognitive Status: Impaired/Different from baseline Area of Impairment: Orientation, Attention, Memory, Following commands,  Safety/judgement, Awareness, Problem solving                 Orientation Level: Disoriented to, Time Current Attention Level: Sustained Memory: Decreased short-term memory Following Commands: Follows one step commands with increased time Safety/Judgement: Decreased awareness of safety, Decreased awareness of deficits Awareness: Intellectual Problem Solving: Slow processing, Requires verbal cues, Requires tactile cues, Difficulty sequencing General Comments: pt states that it is 1974. She can state where she is but questions whether she is right later on. Frequently mixes up words. Slow to speak and process info. Decreased body awareness and awareness of safety.        General Comments      Exercises     Assessment/Plan    PT Assessment Patient needs continued PT services  PT Problem List Decreased strength;Decreased activity tolerance;Decreased balance;Decreased mobility;Decreased coordination;Decreased cognition;Decreased knowledge of use of DME;Decreased safety awareness;Decreased knowledge of precautions;Pain;Impaired sensation       PT Treatment Interventions DME instruction;Gait training;Functional mobility training;Therapeutic activities;Therapeutic exercise;Balance training;Neuromuscular re-education;Cognitive remediation;Patient/family education    PT Goals (Current goals can be found in the Care Plan section)  Acute Rehab PT Goals Patient Stated Goal: get in touch with her fiance PT Goal Formulation: With patient Time For Goal Achievement: 03/21/22 Potential to Achieve Goals: Good    Frequency Min 2X/week  Co-evaluation PT/OT/SLP Co-Evaluation/Treatment: Yes Reason for Co-Treatment: Complexity of the patient's impairments (multi-system involvement);Necessary to address cognition/behavior during functional activity;For patient/therapist safety PT goals addressed during session: Mobility/safety with mobility;Balance;Proper use of DME         AM-PAC PT  "6 Clicks" Mobility  Outcome Measure Help needed turning from your back to your side while in a flat bed without using bedrails?: A Little Help needed moving from lying on your back to sitting on the side of a flat bed without using bedrails?: A Little Help needed moving to and from a bed to a chair (including a wheelchair)?: A Lot Help needed standing up from a chair using your arms (e.g., wheelchair or bedside chair)?: Total Help needed to walk in hospital room?: Total Help needed climbing 3-5 steps with a railing? : Total 6 Click Score: 11    End of Session Equipment Utilized During Treatment: Gait belt Activity Tolerance: Patient tolerated treatment well Patient left: in chair;with call bell/phone within reach;with chair alarm set Nurse Communication: Mobility status PT Visit Diagnosis: Unsteadiness on feet (R26.81);Difficulty in walking, not elsewhere classified (R26.2);Muscle weakness (generalized) (M62.81)    Time: 8676-1950 PT Time Calculation (min) (ACUTE ONLY): 24 min   Charges:   PT Evaluation $PT Eval Moderate Complexity: 1 Mod          Lyanne Co, PT  Acute Rehab Services  Pager [phone] Office [phone]   Lawana Chambers Dravon Nott 03/07/2022, 11:26 AM

## 2022-03-08 LAB — CBC
HCT: 35.9 % — ABNORMAL LOW (ref 36.0–46.0)
Hemoglobin: 12.1 g/dL (ref 12.0–15.0)
MCH: 34.7 pg — ABNORMAL HIGH (ref 26.0–34.0)
MCHC: 33.7 g/dL (ref 30.0–36.0)
MCV: 102.9 fL — ABNORMAL HIGH (ref 80.0–100.0)
Platelets: 229 10*3/uL (ref 150–400)
RBC: 3.49 MIL/uL — ABNORMAL LOW (ref 3.87–5.11)
RDW: 14.8 % (ref 11.5–15.5)
WBC: 3.2 10*3/uL — ABNORMAL LOW (ref 4.0–10.5)
nRBC: 0 % (ref 0.0–0.2)

## 2022-03-08 LAB — BASIC METABOLIC PANEL
Anion gap: 6 (ref 5–15)
BUN: 6 mg/dL — ABNORMAL LOW (ref 8–23)
CO2: 23 mmol/L (ref 22–32)
Calcium: 8.8 mg/dL — ABNORMAL LOW (ref 8.9–10.3)
Chloride: 107 mmol/L (ref 98–111)
Creatinine, Ser: 0.53 mg/dL (ref 0.44–1.00)
GFR, Estimated: >60 mL/min (ref >60)
Glucose, Bld: 93 mg/dL (ref 70–99)
Potassium: 3.6 mmol/L (ref 3.5–5.1)
Sodium: 136 mmol/L (ref 135–145)

## 2022-03-08 MED ORDER — GABAPENTIN 100 MG PO CAPS
100.0000 mg | ORAL_CAPSULE | Freq: Three times a day (TID) | ORAL | Status: DC
  Administered 2022-03-08 – 2022-03-11 (×12): 100 mg via ORAL
  Filled 2022-03-08 (×12): qty 1

## 2022-03-08 MED ORDER — ACETAMINOPHEN 325 MG PO TABS
650.0000 mg | ORAL_TABLET | ORAL | Status: DC | PRN
  Administered 2022-03-08 – 2022-03-09 (×3): 650 mg via ORAL
  Filled 2022-03-08 (×3): qty 2

## 2022-03-08 NOTE — Hospital Course (Signed)
#  Hypokalemia, resolved Patient presented here with potassium of 2.8.  Normokalemic this AM.  -Trend BMP -Repleting electrolytes as needed

## 2022-03-08 NOTE — Social Work (Signed)
Pt is not oriented today, CSW contacted all emergency contacts with no answer from either. Pt daughter number is not active. CSW will follow up with pt SO.

## 2022-03-08 NOTE — NC FL2 (Addendum)
[place] MEDICAID FL2 LEVEL OF CARE SCREENING TOOL     IDENTIFICATION  Patient Name: [name] Birthdate: 05-Jul-1956 Sex: female Admission Date (Current Location): 03/04/2022  [facility] and [place] Number:  Producer, television/film/video and Address:  [facility], [address] [place], [place] [address]      Provider Number: [id number]  Attending Physician Name and Address:  Ginnie Smart, MD  Relative Name and Phone Number:  Demetrius Charity) Storms,Tony Significant other     [phone]    Current Level of Care: Hospital Recommended Level of Care: Skilled Nursing Facility Prior Approval Number:    Date Approved/Denied:   PASRR Number: [id number]   Discharge Plan: SNF    Current Diagnoses: Patient Active Problem List   Diagnosis Date Noted   Right sided weakness    Seizure (HCC) 03/04/2022   Alcohol-induced mood disorder (HCC)    Closed displaced oblique fracture of shaft of left humerus 08/01/2020   Diverticulitis 02/29/2020   Acute diverticulitis 02/28/2020   GERD (gastroesophageal reflux disease) 02/28/2020   Suicidal ideation    Alcohol withdrawal syndrome, with delirium (HCC) 12/13/2018   Thrombocytopenia (HCC) 12/13/2018   Homeless 12/13/2018   Depression 12/13/2018   Neuropathy 12/13/2018   Subarachnoid hemorrhage (HCC) 12/11/2018   Seizure due to alcohol withdrawal (HCC) 12/11/2018   Alcohol dependence with alcohol-induced mood disorder (HCC)    MDD (major depressive disorder), recurrent severe, without psychosis (HCC) 10/12/2018   Major depressive disorder, 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   Alcohol use disorder, severe, dependence (HCC) 05/16/2018    Orientation RESPIRATION BLADDER Height & Weight     Self, Place  Normal Incontinent, External catheter Weight:   Height:     BEHAVIORAL SYMPTOMS/MOOD NEUROLOGICAL BOWEL NUTRITION STATUS      Incontinent Diet (See DC Summary)  AMBULATORY STATUS COMMUNICATION  OF NEEDS Skin   Extensive Assist Verbally Normal                       Personal Care Assistance Level of Assistance  Bathing, Feeding, Dressing Bathing Assistance: Maximum assistance Feeding assistance: Independent Dressing Assistance: Maximum assistance     Functional Limitations Info  Sight, Hearing, Speech Sight Info: Adequate Hearing Info: Adequate Speech Info: Adequate    SPECIAL CARE FACTORS FREQUENCY  PT (By licensed PT), OT (By licensed OT)     PT Frequency: 5x a week OT Frequency: 5x a week            Contractures Contractures Info: Not present    Additional Factors Info  Code Status, Allergies Code Status Info: Full Allergies Info: Cephalexin   Cephalosporins   Ketorolac   Prednisone   Tramadol           Current Medications (03/08/2022):  This is the current hospital active medication list Current Facility-Administered Medications  Medication Dose Route Frequency Provider Last Rate Last Admin   acetaminophen (TYLENOL) tablet 650 mg  650 mg Oral Q4H PRN Andrey Campanile, MD       enoxaparin (LOVENOX) injection 40 mg  40 mg Subcutaneous Q24H Albertha Ghee, MD   40 mg at 03/08/22 1318   gabapentin (NEURONTIN) capsule 100 mg  100 mg Oral TID Marolyn Haller, MD   100 mg at 03/08/22 8469   LORazepam (ATIVAN) injection 2 mg  2 mg Intravenous Q15 min PRN Albertha Ghee, MD   2 mg at 03/04/22 1243   thiamine tablet 100 mg  100 mg  Oral Daily Albertha Ghee, MD   100 mg at 03/08/22 6256   Or   thiamine (B-1) injection 100 mg  100 mg Intravenous Daily Albertha Ghee, MD   100 mg at 03/05/22 3893     Discharge Medications: Please see discharge summary for a list of discharge medications.  Relevant Imaging Results:  Relevant Lab Results:   Additional Information SSN: [id number]  Ivette Loyal, [place]

## 2022-03-08 NOTE — Plan of Care (Signed)
  Problem: Education: Goal: Knowledge of General Education information will improve Description: Including pain rating scale, medication(s)/side effects and non-pharmacologic comfort measures 03/08/2022 1646 by Vesta Mixer, RN Outcome: Progressing 03/08/2022 1646 by Vesta Mixer, RN Outcome: Progressing   Problem: Health Behavior/Discharge Planning: Goal: Ability to manage health-related needs will improve 03/08/2022 1646 by Vesta Mixer, RN Outcome: Progressing 03/08/2022 1646 by Vesta Mixer, RN Outcome: Progressing   Problem: Clinical Measurements: Goal: Ability to maintain clinical measurements within normal limits will improve 03/08/2022 1646 by Vesta Mixer, RN Outcome: Progressing 03/08/2022 1646 by Vesta Mixer, RN Outcome: Progressing Goal: Will remain free from infection 03/08/2022 1646 by Vesta Mixer, RN Outcome: Progressing 03/08/2022 1646 by Vesta Mixer, RN Outcome: Progressing Goal: Diagnostic test results will improve 03/08/2022 1646 by Vesta Mixer, RN Outcome: Progressing 03/08/2022 1646 by Vesta Mixer, RN Outcome: Progressing Goal: Respiratory complications will improve 03/08/2022 1646 by Vesta Mixer, RN Outcome: Progressing 03/08/2022 1646 by Vesta Mixer, RN Outcome: Progressing Goal: Cardiovascular complication will be avoided 03/08/2022 1646 by Vesta Mixer, RN Outcome: Progressing 03/08/2022 1646 by Vesta Mixer, RN Outcome: Progressing   Problem: Activity: Goal: Risk for activity intolerance will decrease 03/08/2022 1646 by Vesta Mixer, RN Outcome: Progressing 03/08/2022 1646 by Vesta Mixer, RN Outcome: Progressing   Problem: Nutrition: Goal: Adequate nutrition will be maintained 03/08/2022 1646 by Vesta Mixer, RN Outcome: Progressing 03/08/2022 1646 by Vesta Mixer, RN Outcome: Progressing   Problem: Coping: Goal: Level of anxiety will decrease 03/08/2022 1646 by Vesta Mixer, RN Outcome: Progressing 03/08/2022 1646 by Vesta Mixer, RN Outcome: Progressing   Problem: Elimination: Goal: Will not experience complications related to bowel motility 03/08/2022 1646 by Vesta Mixer, RN Outcome: Progressing 03/08/2022 1646 by Vesta Mixer, RN Outcome: Progressing Goal: Will not experience complications related to urinary retention Outcome: Progressing   Problem: Pain Managment: Goal: General experience of comfort will improve Outcome: Progressing   Problem: Safety: Goal: Ability to remain free from injury will improve Outcome: Progressing   Problem: Skin Integrity: Goal: Risk for impaired skin integrity will decrease Outcome: Progressing   Problem: Education: Goal: Expressions of having a comfortable level of knowledge regarding the disease process will increase Outcome: Progressing   Problem: Coping: Goal: Ability to adjust to condition or change in health will improve Outcome: Progressing Goal: Ability to identify appropriate support needs will improve Outcome: Progressing   Problem: Health Behavior/Discharge Planning: Goal: Compliance with prescribed medication regimen will improve Outcome: Progressing   Problem: Medication: Goal: Risk for medication side effects will decrease Outcome: Progressing   Problem: Clinical Measurements: Goal: Complications related to the disease process, condition or treatment will be avoided or minimized Outcome: Progressing Goal: Diagnostic test results will improve Outcome: Progressing   Problem: Safety: Goal: Verbalization of understanding the information provided will improve Outcome: Progressing   Problem: Self-Concept: Goal: Level of anxiety will decrease Outcome: Progressing Goal: Ability to verbalize feelings about condition will improve Outcome: Progressing

## 2022-03-08 NOTE — Progress Notes (Signed)
   Subjective: Patient was seen at bedside during rounds this morning. She continues to endorse pain in her right ankle and in her neck. 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 She is alert to self and current location. States that the month is April. Able to name simple objects in the room. She has had a cough for the past 2 weeks and states that others around her have also had a cough. No other complaints or concerns at this time.  Objective:  Vital signs in last 24 hours: Vitals:   03/07/22 1558 03/07/22 1913 03/07/22 2310 03/08/22 0334  BP: 104/71 (!) 93/58 (!) 97/57 108/71  Pulse: 99 98 82 66  Resp: 17 18 18 16   Temp: 98.9 F (37.2 C) 98.1 F (36.7 C) 97.8 F (36.6 C) 98 F (36.7 C)  TempSrc: Oral Oral  Oral  SpO2: 97% 96% 97% 99%   General: NAD, nl appearance HE: Normocephalic, atraumatic, EOMI, Conjunctivae normal ENT: No congestion, no rhinorrhea, no exudate or erythema  Cardiovascular: Normal rate, regular rhythm. No murmurs, rubs, or gallops Pulmonary: Effort normal, limited due to coughing during exam. No wheezes, rales, or rhonchi Abdominal: soft, nontender, bowel sounds present Musculoskeletal: no swelling, deformity, injury or tenderness in extremities Skin: Warm, dry, no bruising, erythema, or rash Neuro: Alert and oriented to self and location (states that we are at [facility]). States that the month is April. Able to name objects in the room (i.e. my stethoscope). No other focal deficits appreciated. No seizure activity.  Psychiatric/Behavioral: normal mood, normal behavior      Assessment/Plan:  Principal Problem:   Seizure (HCC) Active Problems:   Alcohol withdrawal syndrome, with delirium (HCC)   Right sided weakness   #Alcohol withdrawal seizures, improving Patient with recent abrupt discontinuation of alcohol intake at home. Stroke work-up was unrevealing.  The patient has been more alert over the past few days. MRI of the brain was  obtained which showed no evidence of acute pathology. Recent EEG showed slowing in left posterior temporal region. Neurology planned to hold off on further EEG if MRI brain was normal, and they have now signed off. PT/OT are recommending SNF; patient is otherwise medically stable for discharge at this time. - Appreciate neurology consult and recommendations - CIWA with ativan - PRN ativan for seizure activity  - Thiamine  - Frequent Neuro checks  Prior to Admission Living Arrangement: In a hotel with significant other Barriers to Discharge: Medical stability, unable to speak with SO or daughter listed in chart.  Dispo: Anticipated discharge pending medical stability, c/s TOC for assistance with placemnent   May, MD 03/08/2022, 7:06 AM Pager: [phone]  After 5pm on weekdays and 1pm on weekends: On Call pager [phone]

## 2022-03-09 DIAGNOSIS — R569 Unspecified convulsions: Secondary | ICD-10-CM | POA: Diagnosis not present

## 2022-03-09 DIAGNOSIS — F10931 Alcohol use, unspecified with withdrawal delirium: Secondary | ICD-10-CM | POA: Diagnosis not present

## 2022-03-09 LAB — MAGNESIUM: Magnesium: 1.8 mg/dL (ref 1.7–2.4)

## 2022-03-09 LAB — BASIC METABOLIC PANEL
Anion gap: 6 (ref 5–15)
BUN: 6 mg/dL — ABNORMAL LOW (ref 8–23)
CO2: 23 mmol/L (ref 22–32)
Calcium: 8.9 mg/dL (ref 8.9–10.3)
Chloride: 107 mmol/L (ref 98–111)
Creatinine, Ser: 0.53 mg/dL (ref 0.44–1.00)
GFR, Estimated: >60 mL/min (ref >60)
Glucose, Bld: 102 mg/dL — ABNORMAL HIGH (ref 70–99)
Potassium: 4.1 mmol/L (ref 3.5–5.1)
Sodium: 136 mmol/L (ref 135–145)

## 2022-03-09 MED ORDER — IBUPROFEN 200 MG PO TABS
600.0000 mg | ORAL_TABLET | Freq: Once | ORAL | Status: AC
  Administered 2022-03-09: 600 mg via ORAL
  Filled 2022-03-09: qty 3

## 2022-03-09 MED ORDER — ACETAMINOPHEN 500 MG PO TABS
1000.0000 mg | ORAL_TABLET | Freq: Three times a day (TID) | ORAL | Status: DC
  Administered 2022-03-09 – 2022-03-11 (×8): 1000 mg via ORAL
  Filled 2022-03-09 (×9): qty 2

## 2022-03-09 NOTE — Progress Notes (Signed)
Occupational Therapy Treatment Patient Details Name: Emily FlakeDarlene Harrison MRN: [id number] DOB: 05/26/1956 Today's Date: 03/09/2022   History of present illness Pt is 66 yo female who presents with seizures upon trying to decrease her EtOH intake. CT unremarkable. EEG showed L temporal focal motor seizure. PMH: EtOH abuse.   OT comments  Pt progressing towards goals, able to ambulate hallway distance min guard with RW and chair follow. Pt min A for LB dressing and and set up for seated grooming task. Pt with memory impairments, requiring moderate cuing for navigational task in hallway to find room, and to recall conversation with nurse prior to therapy session. Pt able to complete BUE exercises with theraband seated in chair with demonstration. Pt presenting with impairments listed below, will follow acutely. Continue to recommend SNF at d/c.   Recommendations for follow up therapy are one component of a multi-disciplinary discharge planning process, led by the attending physician.  Recommendations may be updated based on patient status, additional functional criteria and insurance authorization.    Follow Up Recommendations  Skilled nursing-short term rehab (<3 hours/day)    Assistance Recommended at Discharge Frequent or constant Supervision/Assistance  Patient can return home with the following  A lot of help with walking and/or transfers;A lot of help with bathing/dressing/bathroom;Assistance with cooking/housework;Direct supervision/assist for medications management;Direct supervision/assist for financial management;Assist for transportation;Help with stairs or ramp for entrance   Equipment Recommendations  None recommended by OT;Other (comment) (defer to next venue of care)    Recommendations for Other Services      Precautions / Restrictions Precautions Precautions: Fall Precaution Comments: pt has sitter Restrictions Weight Bearing Restrictions: No       Mobility Bed Mobility                General bed mobility comments: pt received in chair    Transfers Overall transfer level: Needs assistance Equipment used: Rolling walker (2 wheels) Transfers: Sit to/from Stand Sit to Stand: Min guard           General transfer comment: pt able to walk hallway loop distance with chair follow during session, no rest breaks needed     Balance Overall balance assessment: Needs assistance Sitting-balance support: Feet supported, No upper extremity supported Sitting balance-Leahy Scale: Good Sitting balance - Comments: can reach outside BOS without LOB   Standing balance support: Bilateral upper extremity supported, During functional activity, Reliant on assistive device for balance Standing balance-Leahy Scale: Fair Standing balance comment: reliant on RW                           ADL either performed or assessed with clinical judgement   ADL Overall ADL's : Needs assistance/impaired     Grooming: Brushing hair;Set up               Lower Body Dressing: Minimal assistance Lower Body Dressing Details (indicate cue type and reason): to pull up socks Toilet Transfer: Min guard;Ambulation;Regular Toilet;Rolling walker (2 wheels) Toilet Transfer Details (indicate cue type and reason): simulated in room         Functional mobility during ADLs: Min guard;Rolling walker (2 wheels);Cueing for safety;Cueing for sequencing      Extremity/Trunk Assessment Upper Extremity Assessment Upper Extremity Assessment: Generalized weakness (decreased ROM R>L, can flex shoulders to ~85* , 3/5 grasp strength bilaterally, pain with R shoulder flexion)   Lower Extremity Assessment Lower Extremity Assessment: Defer to PT evaluation  Vision   Additional Comments: wears glasses, WFL for tasks during session, will further assess   Perception Perception Perception: Not tested   Praxis Praxis Praxis: Not tested    Cognition Arousal/Alertness:  Awake/alert Behavior During Therapy: Flat affect Overall Cognitive Status: Impaired/Different from baseline Area of Impairment: Orientation, Attention, Memory, Following commands, Safety/judgement, Awareness, Problem solving                 Orientation Level: Disoriented to, Time (thinking it is 1923) Current Attention Level: Sustained Memory: Decreased short-term memory Following Commands: Follows one step commands with increased time Safety/Judgement: Decreased awareness of safety, Decreased awareness of deficits Awareness: Intellectual Problem Solving: Slow processing, Requires verbal cues, Requires tactile cues, Difficulty sequencing General Comments: pt thinking it is 1923, when given cue to add 100 years, pt still unable to recall year. Pt needing step by step cuing to find room number        Exercises Exercises: General Upper Extremity General Exercises - Upper Extremity Shoulder Flexion: AROM, Both, Theraband, 10 reps, Seated Theraband Level (Shoulder Flexion): Level 2 (Red) Elbow Flexion: AROM, Both, 10 reps, Theraband, Seated Theraband Level (Elbow Flexion): Level 2 (Red)    Shoulder Instructions       General Comments VSS on RA    Pertinent Vitals/ Pain       Pain Assessment Pain Assessment: Faces Pain Score: 2  Faces Pain Scale: Hurts a little bit Pain Location: L elbow with mobility Pain Descriptors / Indicators: Aching, Sore Pain Intervention(s): Limited activity within patient's tolerance, Monitored during session, Repositioned  Home Living                                          Prior Functioning/Environment              Frequency  Min 2X/week        Progress Toward Goals  OT Goals(current goals can now be found in the care plan section)  Progress towards OT goals: Progressing toward goals  Acute Rehab OT Goals Patient Stated Goal: none stated OT Goal Formulation: With patient Time For Goal Achievement:  03/21/22 Potential to Achieve Goals: Good ADL Goals Pt Will Perform Upper Body Dressing: with min assist;sitting;with caregiver independent in assisting Pt Will Perform Lower Body Dressing: with min assist;sit to/from stand;sitting/lateral leans;with adaptive equipment;with caregiver independent in assisting Pt Will Transfer to Toilet: with min guard assist;ambulating;regular height toilet Pt Will Perform Tub/Shower Transfer: Shower transfer;Tub transfer;shower seat;rolling walker;ambulating;with mod assist Additional ADL Goal #1: Pt will follow 2 step instruction with 75% accuracy in prep for ADLs  Plan Discharge plan remains appropriate;Frequency remains appropriate    Co-evaluation                 AM-PAC OT "6 Clicks" Daily Activity     Outcome Measure   Help from another person eating meals?: A Little Help from another person taking care of personal grooming?: A Little Help from another person toileting, which includes using toliet, bedpan, or urinal?: A Little Help from another person bathing (including washing, rinsing, drying)?: A Lot Help from another person to put on and taking off regular upper body clothing?: A Lot Help from another person to put on and taking off regular lower body clothing?: A Lot 6 Click Score: 15    End of Session Equipment Utilized During Treatment: Gait belt;Rolling walker (2 wheels)  OT  Visit Diagnosis: Unsteadiness on feet (R26.81);Other abnormalities of gait and mobility (R26.89);Muscle weakness (generalized) (M62.81);Other symptoms and signs involving cognitive function;Other symptoms and signs involving the nervous system (R29.898)   Activity Tolerance Patient tolerated treatment well   Patient Left in chair;with call bell/phone within reach;with chair alarm set;with nursing/sitter in room   Nurse Communication Mobility status        Time: 4193-7902 OT Time Calculation (min): 24 min  Charges: OT General Charges $OT Visit: 1  Visit OT Treatments $Therapeutic Activity: 23-37 mins  Alfonzo Beers, OTD, OTR/L Acute Rehab [phone]   [name] 03/09/2022, 5:02 PM

## 2022-03-09 NOTE — Care Management Important Message (Signed)
Important Message  Patient Details  Name: [name] MRN: [id number] Date of Birth: 05/06/1956   Medicare Important Message Given:  Yes     Terald Jump [address] 03/09/2022, 3:39 PM

## 2022-03-09 NOTE — Progress Notes (Signed)
   Subjective: Patient was seen at bedside during rounds this morning. Continues to endorse some neck pain. Tylenol helps some. Endorses some right sided abdominal pain that started yesterday. Did have bowel movement yesterday for the first time in a while. Patient wants to get back on her prozac for her depression and start seeing her therapist. She is also interested in becoming established at our clinic downstairs. No other complaints or concerns at this time.   Objective:  Vital signs in last 24 hours: Vitals:   03/08/22 1950 03/08/22 2318 03/09/22 0440 03/09/22 0804  BP: 116/76 121/75 (!) 154/86 131/74  Pulse: 80 67 67 62  Resp: 20 18 20 20   Temp: 97.7 F (36.5 C) 97.8 F (36.6 C) 97.7 F (36.5 C) 98.4 F (36.9 C)  TempSrc: Oral Oral Oral Oral  SpO2: 100% 97% 98% 98%   General: NAD, nl appearance HE: Normocephalic, atraumatic, EOMI, Conjunctivae normal ENT: No congestion, no rhinorrhea, no exudate or erythema  Cardiovascular: Normal rate, regular rhythm. No murmurs, rubs, or gallops Pulmonary: Effort normal, normal breath sounds. No wheezes, rales, or rhonchi Abdominal: Soft, nontender, bowel sounds present Musculoskeletal: No swelling, deformity, injury or tenderness in extremities Skin: Warm, dry, no bruising, erythema, or rash Neuro: AAO x 3, no focal deficits appreciated Psychiatric/Behavioral: normal mood, normal behavior      Assessment/Plan:  Principal Problem:   Seizure (HCC) Active Problems:   Alcohol withdrawal syndrome, with delirium ([place])   Right sided weakness   #Alcohol withdrawal seizures, resolved Patient with recent abrupt discontinuation of alcohol intake at home. Stroke work-up was unrevealing.  The patient has been more alert over the past few days. MRI of the brain was obtained which showed no evidence of acute pathology. Recent EEG showed slowing in left posterior temporal region. Neurology planned to hold off on further EEG if MRI brain was normal,  and they have now signed off. PT/OT are recommending SNF; patient is otherwise medically stable for discharge at this time. At discharge, will help the patient become established in our clinic as she does not currently have a PCP at this time.  - Appreciate neurology consult and recommendations - CIWA with ativan - PRN ativan for seizure activity  - Thiamine  - Frequent Neuro checks  Prior to Admission Living Arrangement: In a hotel with significant other Barriers to Discharge: Medical stability, unable to speak with SO or daughter listed in chart.  Dispo: Anticipated discharge pending medical stability, c/s TOC for assistance with placemnent   Orvis Brill, MD 03/09/2022, 10:35 AM Pager: [phone]  After 5pm on weekdays and 1pm on weekends: On Call pager [phone]

## 2022-03-09 NOTE — Plan of Care (Signed)

## 2022-03-09 NOTE — TOC Progression Note (Signed)
Transition of Care [facility]) - Progression Note    Patient Details  Name: [name] MRN: [id number] Date of Birth: 1956-09-13  [facility] of [facility]) CM/SW Contact  Baldemar Lenis, [place] Phone Number: 03/09/2022, 11:23 AM  Clinical Narrative:   CSW faxed out referral on patient this morning, no offers as of yet. CSW attempted to reach patient's friend [name], left a voicemail asking for assistance with discharge planning. Patient will be unable to sign herself in given her current mental status, will need family to assist. CSW updated MD with barriers to discharge. CSW to follow.    Expected Discharge Plan: Skilled Nursing Facility Barriers to Discharge: Continued Medical Work up, English as a second language teacher, Requiring sitter/restraints, Unsafe home situation, Family Issues  Expected Discharge Plan and Services Expected Discharge Plan: Skilled Nursing Facility In-house Referral: Clinical Social Work Discharge Planning Services: [employer] Consult Post Acute Care Choice: Skilled Nursing Facility Living arrangements for the past 2 months: Hotel/Motel                                       Social Determinants of Health (SDOH) Interventions    Readmission Risk Interventions     View : No data to display.

## 2022-03-10 DIAGNOSIS — F10931 Alcohol use, unspecified with withdrawal delirium: Secondary | ICD-10-CM | POA: Diagnosis not present

## 2022-03-10 DIAGNOSIS — R569 Unspecified convulsions: Secondary | ICD-10-CM | POA: Diagnosis not present

## 2022-03-10 LAB — BASIC METABOLIC PANEL
Anion gap: 7 (ref 5–15)
BUN: 7 mg/dL — ABNORMAL LOW (ref 8–23)
CO2: 23 mmol/L (ref 22–32)
Calcium: 9.3 mg/dL (ref 8.9–10.3)
Chloride: 108 mmol/L (ref 98–111)
Creatinine, Ser: 0.59 mg/dL (ref 0.44–1.00)
GFR, Estimated: >60 mL/min (ref >60)
Glucose, Bld: 80 mg/dL (ref 70–99)
Potassium: 4.1 mmol/L (ref 3.5–5.1)
Sodium: 138 mmol/L (ref 135–145)

## 2022-03-10 NOTE — Plan of Care (Signed)
  Problem: Education: Goal: Knowledge of General Education information will improve Description Including pain rating scale, medication(s)/side effects and non-pharmacologic comfort measures Outcome: Progressing   Problem: Activity: Goal: Risk for activity intolerance will decrease Outcome: Progressing   Problem: Safety: Goal: Ability to remain free from injury will improve Outcome: Progressing   

## 2022-03-10 NOTE — TOC Progression Note (Addendum)
Transition of Care [facility]) - Progression Note    Patient Details  Name: [name] MRN: [id number] Date of Birth: 1955-11-27  Transition of Care [facility]) CM/SW Contact  Pollie Friar, RN Phone Number: 03/10/2022, 9:54 AM  Clinical Narrative:    CM was able to talk to [name] and [name] over the phone. They are agreeable for patient to attend SNF rehab at [facility]. [name] will be the one to sing her in.  CM has asked CM MOA to begin insurance auth for admission to [facility].  TOC following.  11:30: pts orientation is better today. CM met with her and she is in agreement with admitted to [facility] for rehab.   Expected Discharge Plan: Skilled Nursing Facility Barriers to Discharge: Continued Medical Work up, Ship broker, Requiring sitter/restraints, Unsafe home situation, Family Issues  Expected Discharge Plan and Services Expected Discharge Plan: [place] In-house Referral: Clinical Social Work Discharge Planning Services: [employer] Consult Post Acute Care Choice: [place] Living arrangements for the past 2 months: Hotel/Motel                                       Social Determinants of Health (SDOH) Interventions    Readmission Risk Interventions     View : No data to display.

## 2022-03-10 NOTE — Progress Notes (Signed)
Physical Therapy Treatment Patient Details Name: [name] MRN: [id number] DOB: [birth year] Today's Date: 03/10/2022   History of Present Illness Pt is 66 yo female who presents with seizures upon trying to decrease her EtOH intake. CT unremarkable. EEG showed L temporal focal motor seizure. PMH: EtOH abuse.    PT Comments    Pt progressing towards all goals however continues to demonstrate both cognitive and functional deficits. Pt with decreased insight to safety and deficits, inability to process multistep commands, unable to count backwards by 3's starting from 100, stated 9,6, 3 first trial, 2nd trial 100, 98, 95 and then stated "that's all I can do." Pt improving in both ability to ambulate and tolerate increased distance however remains at increased falls risk. Acute PT to cont to follow.    Recommendations for follow up therapy are one component of a multi-disciplinary discharge planning process, led by the attending physician.  Recommendations may be updated based on patient status, additional functional criteria and insurance authorization.  Follow Up Recommendations  Skilled nursing-short term rehab (<3 hours/day)     Assistance Recommended at Discharge Frequent or constant Supervision/Assistance  Patient can return home with the following A lot of help with walking and/or transfers;A lot of help with bathing/dressing/bathroom;Assistance with cooking/housework;Direct supervision/assist for medications management;Direct supervision/assist for financial management;Assist for transportation;Help with stairs or ramp for entrance   Equipment Recommendations  None recommended by PT    Recommendations for Other Services       Precautions / Restrictions Precautions Precautions: Fall Precaution Comments: pt has sitter Restrictions Weight Bearing Restrictions: No     Mobility  Bed Mobility               General bed mobility comments: pt received in chair     Transfers Overall transfer level: Needs assistance Equipment used: Rolling walker (2 wheels) Transfers: Sit to/from Stand Sit to Stand: Min guard           General transfer comment: max verbal cues for safe hand placement 5x t/o session, pt with no carry over continuing to try to pull up on walker    Ambulation/Gait Ambulation/Gait assistance: Min assist Gait Distance (Feet): 200 Feet Assistive device: Rolling walker (2 wheels), 1 person hand held assist, None Gait Pattern/deviations: Trunk flexed, Step-through pattern, Decreased stride length, Decreased dorsiflexion - right, Decreased dorsiflexion - left Gait velocity: decreased Gait velocity interpretation: 1.31 - 2.62 ft/sec, indicative of limited community ambulator   General Gait Details: pt able to clear bilat feet however with short step length and height, very guarded, minA for walker management at time around obstacles. verbal cues to stay in walker. Trialed amb with L HHA, pt pushing hard down through hand into PT's hand to steady self and had demo'd less stability. Then transitioned to no AD or HHA, PT providing posterior support at pelvis, pt able to amb but even more guarded and cautious holding hands out to brace self   Stairs             Wheelchair Mobility    Modified Rankin (Stroke Patients Only) Modified Rankin (Stroke Patients Only) Pre-Morbid Rankin Score: No symptoms Modified Rankin: Moderate disability     Balance Overall balance assessment: Needs assistance Sitting-balance support: Feet supported, No upper extremity supported Sitting balance-Leahy Scale: Good     Standing balance support: Bilateral upper extremity supported, During functional activity, Reliant on assistive device for balance Standing balance-Leahy Scale: Fair Standing balance comment: reliant on external support for dynamic activity  High level balance activites: Backward walking, Side stepping High Level  Balance Comments: pt requiring minA anteriorly to ambulate backwards x 25', minA for side stepping 20' to L and R. Attempted to ambulate heel/toe tandomly however pt unable without maxA to prevent fall            Cognition Arousal/Alertness: Awake/alert Behavior During Therapy: Impulsive (slightly) Overall Cognitive Status: Impaired/Different from baseline Area of Impairment: Attention, Memory, Following commands, Safety/judgement, Awareness, Problem solving                   Current Attention Level: Sustained Memory: Decreased short-term memory Following Commands: Follows one step commands with increased time, Follows multi-step commands inconsistently Safety/Judgement: Decreased awareness of safety, Decreased awareness of deficits Awareness: Intellectual Problem Solving: Slow processing, Requires verbal cues, Requires tactile cues, Difficulty sequencing General Comments: pt unable to follow directional cues ie "turn R and then L", "when you get to the turn around and stop", poor short term memory, pt is orientedx4 and states "I ain't never drinking again thats for sure. That seizure really messed up my brain. I don't remember anything for 2 days." Pt slightly impulsive with decreased safety awareness and insight to cognitive deficits. Pt aware of her generalized weakness and demo's fear of falling        Exercises      General Comments General comments (skin integrity, edema, etc.): VSS on RA      Pertinent Vitals/Pain Pain Assessment Pain Assessment: No/denies pain    Home Living                          Prior Function            PT Goals (current goals can now be found in the care plan section) Acute Rehab PT Goals PT Goal Formulation: With patient Time For Goal Achievement: 03/21/22 Potential to Achieve Goals: Good Progress towards PT goals: Progressing toward goals    Frequency    Min 2X/week      PT Plan Current plan remains appropriate     Co-evaluation              AM-PAC PT "6 Clicks" Mobility   Outcome Measure  Help needed turning from your back to your side while in a flat bed without using bedrails?: A Little Help needed moving from lying on your back to sitting on the side of a flat bed without using bedrails?: A Little Help needed moving to and from a bed to a chair (including a wheelchair)?: A Lot Help needed standing up from a chair using your arms (e.g., wheelchair or bedside chair)?: A Lot Help needed to walk in hospital room?: A Little Help needed climbing 3-5 steps with a railing? : A Lot 6 Click Score: 15    End of Session Equipment Utilized During Treatment: Gait belt Activity Tolerance: Patient tolerated treatment well Patient left: in chair;with call bell/phone within reach;with chair alarm set;with nursing/sitter in room Nurse Communication: Mobility status PT Visit Diagnosis: Unsteadiness on feet (R26.81);Difficulty in walking, not elsewhere classified (R26.2);Muscle weakness (generalized) (M62.81)     Time: 6503-5465 PT Time Calculation (min) (ACUTE ONLY): 18 min  Charges:  $Gait Training: 8-22 mins                     Lewis Shock, PT, DPT Acute Rehabilitation Services Secure chat preferred Office #: [phone]    Iona Hansen 03/10/2022, 10:13 AM

## 2022-03-10 NOTE — Progress Notes (Signed)
   Subjective: Patient was seen at bedside during rounds this morning. She states that she was walking around the halls earlier today and had a little bit of dizziness was doing well.  She does not have any other specific complaints or concerns.  She is interested in reengaging with her prior psychiatrist and becoming established in our clinic.  No other complaints or concerns today.  Objective:  Vital signs in last 24 hours: Vitals:   03/09/22 1914 03/09/22 2332 03/10/22 0303 03/10/22 0759  BP: 112/70 108/63 104/62 127/81  Pulse: 70 72 68 73  Resp: 20 18 18 20   Temp: 97.7 F (36.5 C) 97.8 F (36.6 C) 98.2 F (36.8 C) 97.8 F (36.6 C)  TempSrc: Oral Oral Axillary Oral  SpO2: 100% 100% 100% 97%   General: NAD, nl appearance HE: Normocephalic, atraumatic, EOMI, Conjunctivae normal ENT: No congestion, no rhinorrhea, no exudate or erythema  Cardiovascular: Normal rate, regular rhythm. No murmurs, rubs, or gallops Pulmonary: Effort normal, normal breath sounds. No wheezes, rales, or rhonchi Abdominal: Soft, nontender, bowel sounds present Musculoskeletal: No swelling, deformity, injury or tenderness in extremities Skin: Warm, dry, no bruising, erythema, or rash Neuro: AAO x 3, no focal deficits appreciated Psychiatric/Behavioral: normal mood, normal behavior      Assessment/Plan:  Principal Problem:   Seizure (HCC) Active Problems:   Alcohol withdrawal syndrome, with delirium (HCC)   Right sided weakness   #Alcohol withdrawal seizures, resolved Patient with recent abrupt discontinuation of alcohol intake at home. Stroke work-up was unrevealing.  The patient has been more alert over the past few days. MRI of the brain was obtained which showed no evidence of acute pathology. Recent EEG showed slowing in left posterior temporal region. Neurology planned to hold off on further EEG if MRI brain was normal, and they have now signed off.   PT/OT are recommending SNF; patient is  otherwise medically stable for discharge at this time. Awaiting insurance auth. I do believe that the patient has capacity to sign herself in. At discharge, we will also help the patient become established in our clinic as she does not currently have a PCP at this time. Will also coordinate psychiatry follow-up with (her prior psychiatrist) with the assistance of our [facility] team.  - Appreciate neurology consult and recommendations - Appreciate TOC's assistance - CIWA with ativan - PRN ativan for seizure activity  - Thiamine  - Frequent Neuro checks  Prior to Admission Living Arrangement: In a hotel with her significant other Barriers to Discharge: Placement Dispo: Anticipated discharge pending placement  [facility], MD 03/10/2022, 10:41 AM Pager: [phone]  After 5pm on weekdays and 1pm on weekends: On Call pager [phone]

## 2022-03-11 MED ORDER — GABAPENTIN 100 MG PO CAPS: 100.0000 mg | ORAL_CAPSULE | Freq: Three times a day (TID) | ORAL | Status: DC

## 2022-03-11 MED ORDER — THIAMINE HCL 100 MG PO TABS: 100.0000 mg | ORAL_TABLET | Freq: Every day | ORAL | 0 refills | Status: DC

## 2022-03-11 MED ORDER — ACETAMINOPHEN 500 MG PO TABS
1000.0000 mg | ORAL_TABLET | Freq: Three times a day (TID) | ORAL | 0 refills | Status: DC
Start: 2022-03-11 — End: 2022-04-07

## 2022-03-11 NOTE — Progress Notes (Signed)
Patient left department via stretcher in care of [facility] for transportation to facility.  Patient remains alert and oriented x4 and denies complaint at time of departure.

## 2022-03-11 NOTE — Discharge Summary (Signed)
Name: [name] MRN: [id number] DOB: [birth year] 66 y.o. PCP: [facility]  Date of Admission: 03/04/2022  8:05 AM Date of Discharge: 03/11/2022 Attending Physician: Ginnie Smart, MD  Discharge Diagnosis: 1. Alcohol withdrawal seizures 2. H/o depression  Discharge Medications: Allergies as of 03/11/2022       Reactions   Cephalexin Other (See Comments)   Unknown   Cephalosporins Hives   Ketorolac Hives   Prednisone Swelling   Tramadol Hives        Medication List     STOP taking these medications    furosemide 20 MG tablet Commonly known as: LASIX   HYDROcodone-acetaminophen 5-325 MG tablet Commonly known as: Norco   hydrOXYzine 25 MG tablet Commonly known as: ATARAX   ibuprofen 200 MG tablet Commonly known as: ADVIL   pantoprazole 40 MG tablet Commonly known as: PROTONIX   potassium chloride SA 20 MEQ tablet Commonly known as: KLOR-CON M       TAKE these medications    acetaminophen 500 MG tablet Commonly known as: TYLENOL Take 2 tablets (1,000 mg total) by mouth 3 (three) times daily.   gabapentin 100 MG capsule Commonly known as: NEURONTIN Take 1 capsule (100 mg total) by mouth 3 (three) times daily. What changed:  medication strength how much to take   ondansetron 4 MG disintegrating tablet Commonly known as: Zofran ODT Take 1 tablet (4 mg total) by mouth every 8 (eight) hours as needed for nausea or vomiting.   thiamine 100 MG tablet Take 1 tablet (100 mg total) by mouth daily. Start taking on: Mar 12, 2022   vitamin C 500 MG tablet Commonly known as: ASCORBIC ACID Take 500 mg by mouth daily.        Disposition and follow-up:   [name] was discharged from [facility] in Good condition.  At the hospital follow up visit please address:  1.  Attending SNF for rehab. Patient is extremely motivated to quit drinking alcohol, and we have been coordinating with social work to  get her reengaged with her psychiatrist from [place] and with a new sponsor from [employer]. Please address this at next visit to ensure this plan hs followed through. Wants to establish in our clinic.  2.  Labs / imaging needed at time of follow-up: BMP to ensure normokalemic  3.  Pending labs/ test needing follow-up: None  Follow-up Appointments:   Hospital Course by problem list:  #Alcohol withdrawal seizures, resolved Patient with recent abrupt discontinuation of alcohol intake at home. She was brought to the ED and a code stroke was subsequently called. Stroke work-up was unrevealing (negative CT head and CTA). Ethanol < 10. MRI of the brain was obtained which showed no evidence of acute pathology. Pt was placed on CIWA with ativan protocol, scheduled ativan, and thiamine for tx of alcohol withdrawal seizures. Neurology continued to follow the patient with EEGs; gave a one time dose of phenobarbital and continued as needed ativan for seizure-like activity. Her mental status improved over the next few days and patient returned to her baseline prior to DC. PT/OT recommended SNF.  Overall, pt is extremely motivated to quit drinking alcohol. We are working with our [facility] team to get her reengaged with AA and her psychiatrist at [facility]. She is also interested in establishing care in our [facility] clinic, so this was also arranged at discharge.   #Hypokalemia, resolved Patient presented here with potassium of 2.8.  She was given potassium supplementation  and became normokalemic with stable potassium levels at discharge.  Discharge Exam:   BP 116/83 (BP Location: Right Arm)   Pulse 78   Temp 98 F (36.7 C) (Oral)   Resp 16   SpO2 98%  Discharge exam:  General: NAD, nl appearance HE: Normocephalic, atraumatic, EOMI, Conjunctivae normal ENT: No congestion, no rhinorrhea, no exudate or erythema  Cardiovascular: Normal rate, regular rhythm. No murmurs, rubs, or gallops Pulmonary: Effort normal, breath  sounds normal. No wheezes, rales, or rhonchi Abdominal: soft, nontender, bowel sounds present Musculoskeletal: no swelling, deformity, injury or tenderness in extremities Skin: Warm, dry, no bruising, erythema, or rash Psychiatric/Behavioral: normal mood, normal behavior     Pertinent Labs, Studies, and Procedures:  EEG adult  Result Date: 03/04/2022 Charlsie QuestYadav, Priyanka O, MD     03/04/2022  2:25 PM Patient Name: [name] MRN: [id number] Epilepsy Attending: Charlsie QuestPriyanka O Yadav Referring Physician/Provider: Caryl PinaLindzen, Eric, MD Date: 03/04/2222 Duration: 36.47 mins Patient history: 66 year old female with EtOH dependency and a history of seizures as well as EtOH withdrawal, presenting via EMS as a Code Stroke for evaluation of right sided weakness after a seizure at home.  EEG to evaluate for seizure Level of alertness:  lethargic AEDs during EEG study: Ativan, Keppra Technical aspects: This EEG study was done with scalp electrodes positioned according to the 10-20 International system of electrode placement. Electrical activity was acquired at a sampling rate of 500Hz  and reviewed with a high frequency filter of 70Hz  and a low frequency filter of 1Hz . EEG data were recorded continuously and digitally stored. Description: No clear posterior dominant rhythm was seen.  At the beginning of the study EEG showed sharply contoured rhythmic 3 to 5 Hz theta-delta slowing in left hemisphere admixed with sharp waves which appeared quasiperiodic at 1 Hz.  Right hemisphere showed low amplitude 2 to 3 Hz delta slowing admixed with 13 to 14 Hz beta activity. At around 1116 patient was noted to have right facial twitching followed by head and eye forced deviation and figure-of-four posturing and eventually subtle whole-body twitching.  Concomitant EEG showed 2 to 3 Hz delta slowing admixed with 12 to 13 Hz beta activity in left temporal region which involved all of left hemisphere and then spread to right hemisphere.  Duration of  seizure was about 3 minutes. After the seizure, EEG showed continuous generalized and lateralized left hemisphere 3 to 6 Hz theta-delta slowing. At around 1134, another event was noted during which patient had right head and eye deviation followed by subtle whole-body twitching. Concomitant EEG showed 2 to 3 Hz delta slowing admixed with 12 to 13 Hz beta activity in left temporal region which involved all of left hemisphere and then spread to right hemisphere.  Duration of seizure was about 2 minutes. Hyperventilation and photic stimulation were not performed.   ABNORMALITY -Focal motor seizure, left temporal region -Continuous slow, generalized and lateralized left hemisphere IMPRESSION: This study showed 2 seizures as described above at 1116 and 1134, lasting 3 and 2 minutes respectively, arising from left temporal region.  Additionally there is cortical dysfunction in left hemisphere likely secondary to underlying structural abnormality.  There is also moderate to severe diffuse encephalopathy, nonspecific etiology but likely related to seizures . Charlsie QuestPriyanka O Yadav   CT HEAD CODE STROKE WO CONTRAST  Result Date: 03/04/2022 CLINICAL DATA:  Code stroke.  Neuro deficit, acute, stroke suspected EXAM: CT HEAD WITHOUT CONTRAST TECHNIQUE: Contiguous axial images were obtained from the base of the skull  through the vertex without intravenous contrast. RADIATION DOSE REDUCTION: This exam was performed according to the departmental dose-optimization program which includes automated exposure control, adjustment of the mA and/or kV according to patient size and/or use of iterative reconstruction technique. COMPARISON:  CT head 05/10/2021. FINDINGS: Brain: No evidence of acute large vascular territory infarction, hemorrhage, hydrocephalus, extra-axial collection or mass lesion/mass effect. Vascular: No hyperdense vessel identified. Calcific intracranial atherosclerosis. Skull: No acute fracture Sinuses/Orbits: Paranasal  sinus mucosal thickening. No acute orbital Other: No mastoid effusions ASPECTS [facility] Stroke Program Early CT Score) total score (0-10 with 10 being normal): 10. IMPRESSION: No evidence of acute intracranial abnormality. ASPECTS is 10. Code stroke imaging results were communicated on 03/04/2022 at 8:32 am to provider Dr. Otelia Limes via secure text paging. Electronically Signed   By: Feliberto Harts M.D.   On: 03/04/2022 08:32   CT ANGIO HEAD NECK W WO CM W PERF (CODE STROKE)  Result Date: 03/04/2022 CLINICAL DATA:  Neuro deficit, acute, stroke suspected EXAM: CT ANGIOGRAPHY HEAD AND NECK CT PERFUSION BRAIN TECHNIQUE: Multidetector CT imaging of the head and neck was performed using the standard protocol during bolus administration of intravenous contrast. Multiplanar CT image reconstructions and MIPs were obtained to evaluate the vascular anatomy. Carotid stenosis measurements (when applicable) are obtained utilizing NASCET criteria, using the distal internal carotid diameter as the denominator. Multiphase CT imaging of the brain was performed following IV bolus contrast injection. Subsequent parametric perfusion maps were calculated using RAPID software. RADIATION DOSE REDUCTION: This exam was performed according to the departmental dose-optimization program which includes automated exposure control, adjustment of the mA and/or kV according to patient size and/or use of iterative reconstruction technique. CONTRAST:  OMNIPAQUE IOHEXOL 350 MG/ML SOLN COMPARISON:  None Available. FINDINGS: CTA NECK Aortic arch: Trace calcified plaque. Great vessel origins are patent. Right carotid system: Patent. Mild calcified plaque at the bifurcation. No stenosis. Left carotid system: Patent. Mild calcified plaque at the bifurcation. No stenosis. Vertebral arteries: Patent.  Codominant.  No stenosis. Skeleton: Prior ACDF C4-C7. Degenerative changes of the cervical spine. Other neck: Unremarkable. Upper chest: No apical  lung mass. Review of the MIP images confirms the above findings CTA HEAD Anterior circulation: Intracranial internal carotid arteries are patent. Anterior cerebral arteries are patent. Anterior communicating artery is present. Middle cerebral arteries are patent with early bifurcation. Posterior circulation: Is intracranial vertebral arteries are patent. Basilar artery is patent. Major cerebellar artery origins are patent. Right larger than left posterior communicating arteries are present. Posterior cerebral arteries are patent. Venous sinuses: Patent as allowed by contrast bolus timing. Review of the MIP images confirms the above findings CT Brain Perfusion Findings: CBF (<30%) Volume: 67mL Perfusion (Tmax>6.0s) volume: 12mL Mismatch Volume: 66mL Infarction Location: None. IMPRESSION: No large vessel occlusion, hemodynamically significant stenosis, or evidence of dissection. Perfusion imaging demonstrates no evidence of core infarction or penumbra. Visually, there is increased blood volume and blood flow on the left with decreased mean transit time. There is also relatively increased greater in left MCA branches than on the right on the CTA. This suggests hyperemia on the left. Electronically Signed   By: Guadlupe Spanish M.D.   On: 03/04/2022 09:32       Latest Ref Rng & Units 03/08/2022    3:36 AM 03/04/2022    8:43 AM 03/04/2022    8:41 AM  CBC  WBC 4.0 - 10.5 K/uL 3.2    4.2    Hemoglobin 12.0 - 15.0 g/dL 12.1  13.9   13.2    Hematocrit 36.0 - 46.0 % 35.9   41.0   40.6    Platelets 150 - 400 K/uL 229    180        Latest Ref Rng & Units 03/10/2022    4:28 AM 03/09/2022    1:01 AM 03/08/2022    3:36 AM  BMP  Glucose 70 - 99 mg/dL 80   161   93    BUN 8 - 23 mg/dL Creatinine 0.44 - 1.00 mg/dL 0.96   0.45   4.09    Sodium 135 - 145 mmol/L 138   136   136    Potassium 3.5 - 5.1 mmol/L 4.1   4.1   3.6    Chloride 98 - 111 mmol/L 108   107   107    CO2 22 - 32 mmol/L Calcium 8.9 - 10.3 mg/dL 9.3   8.9   8.8       Discharge Instructions: Discharge Instructions     Call MD for:  difficulty breathing, headache or visual disturbances   Complete by: As directed    Call MD for:  persistant dizziness or light-headedness   Complete by: As directed    Call MD for:  persistant nausea and vomiting   Complete by: As directed    Call MD for:  temperature >100.4   Complete by: As directed        Signed: Andrey Campanile, MD 03/11/2022, 10:25 AM   Pager: [phone]

## 2022-03-11 NOTE — Progress Notes (Signed)
Patient's discharge report given at [facility] to [place], [place].

## 2022-03-11 NOTE — TOC Transition Note (Signed)
Transition of Care [facility]) - CM/SW Discharge Note   Patient Details  Name: [name] MRN: [id number] Date of Birth: Feb 22, 1956  Transition of Care [facility]) CM/SW Contact:  Pollie Friar, RN Phone Number: 03/11/2022, 11:40 AM   Clinical Narrative:    Patient discharging to [facility] today. Bedside RN updated and d/c packet is at the desk.   Number for report: [phone]   Final next level of care: Skilled Nursing Facility Barriers to Discharge: No Barriers Identified   Patient Goals and CMS Choice Patient states their goals for this hospitalization and ongoing recovery are:: Rehab CMS Medicare.gov Compare Post Acute Care list provided to:: Patient Choice offered to / list presented to : Patient  Discharge Placement              Patient chooses bed at: [facility] Patient to be transferred to facility by: [place] Name of family member notified: [name]/ [name] Patient and family notified of of transfer: 03/11/22  Discharge Plan and Services In-house Referral: Clinical Social Work Discharge Planning Services: [employer] Consult Post Acute Care Choice: [place]                               Social Determinants of Health (SDOH) Interventions     Readmission Risk Interventions     View : No data to display.

## 2022-03-11 NOTE — Progress Notes (Signed)
Occupational Therapy Treatment Patient Details Name: Kenley Rettinger MRN: [id number] DOB: [birth year] Today's Date: 03/11/2022   History of present illness Pt is 66 yo female who presents with seizures upon trying to decrease her EtOH intake. CT unremarkable. EEG showed L temporal focal motor seizure. PMH: EtOH abuse.   OT comments  Pt progressing towards goals, able to ambulate hallway distance at supervision level without AD. Pt able to follow 2 step instruction during hallway ambulation. Pt able to recall room number, and identify how to get there with use of directional sign in hall. Pt presenting with impairments listed below, will follow acutely. Continue to recommend SNF at d/c.   Recommendations for follow up therapy are one component of a multi-disciplinary discharge planning process, led by the attending physician.  Recommendations may be updated based on patient status, additional functional criteria and insurance authorization.    Follow Up Recommendations  Skilled nursing-short term rehab (<3 hours/day)    Assistance Recommended at Discharge Frequent or constant Supervision/Assistance  Patient can return home with the following  A lot of help with walking and/or transfers;A lot of help with bathing/dressing/bathroom;Assistance with cooking/housework;Direct supervision/assist for medications management;Direct supervision/assist for financial management;Assist for transportation;Help with stairs or ramp for entrance   Equipment Recommendations  None recommended by OT;Other (comment) (defer to next venue of care)    Recommendations for Other Services      Precautions / Restrictions Precautions Precautions: Fall Restrictions Weight Bearing Restrictions: No       Mobility Bed Mobility               General bed mobility comments: pt up in room upon arrival    Transfers Overall transfer level: Needs assistance Equipment used: None Transfers: Sit to/from Stand Sit  to Stand: Supervision                 Balance Overall balance assessment: Needs assistance Sitting-balance support: Feet supported, No upper extremity supported Sitting balance-Leahy Scale: Good Sitting balance - Comments: can reach outside BOS without LOB   Standing balance support: Bilateral upper extremity supported, During functional activity, Reliant on assistive device for balance Standing balance-Leahy Scale: Good Standing balance comment: walks hallway distance without AD/use of railing                           ADL either performed or assessed with clinical judgement   ADL Overall ADL's : Needs assistance/impaired                         Toilet Transfer: Regular Toilet;Ambulation;Supervision/safety Toilet Transfer Details (indicate cue type and reason): simulated in room         Functional mobility during ADLs: Supervision/safety      Extremity/Trunk Assessment Upper Extremity Assessment Upper Extremity Assessment: Generalized weakness (decreased ROM R>L, can flex shoulders to ~85* , 3/5 grasp strength bilaterally, pain with R shoulder flexion)   Lower Extremity Assessment Lower Extremity Assessment: Defer to PT evaluation        Vision   Additional Comments: wears glasses, reports they are scratched, vision [facility]   Perception Perception Perception: Not tested   Praxis Praxis Praxis: Not tested    Cognition Arousal/Alertness: Awake/alert   Overall Cognitive Status: Impaired/Different from baseline Area of Impairment: Attention, Memory, Following commands, Safety/judgement, Awareness, Problem solving                   Current Attention Level:  Sustained Memory: Decreased short-term memory Following Commands: Follows one step commands with increased time, Follows multi-step commands inconsistently Safety/Judgement: Decreased awareness of safety, Decreased awareness of deficits Awareness: Intellectual Problem Solving: Slow  processing, Requires verbal cues, Requires tactile cues, Difficulty sequencing          Exercises      Shoulder Instructions       General Comments VSS on RA    Pertinent Vitals/ Pain       Pain Assessment Pain Assessment: No/denies pain  Home Living                                          Prior Functioning/Environment              Frequency  Min 2X/week        Progress Toward Goals  OT Goals(current goals can now be found in the care plan section)  Progress towards OT goals: Progressing toward goals  Acute Rehab OT Goals Patient Stated Goal: none stated OT Goal Formulation: With patient Time For Goal Achievement: 03/21/22 Potential to Achieve Goals: Good ADL Goals Pt Will Perform Upper Body Dressing: with min assist;sitting;with caregiver independent in assisting Pt Will Perform Lower Body Dressing: with min assist;sit to/from stand;sitting/lateral leans;with adaptive equipment;with caregiver independent in assisting Pt Will Transfer to Toilet: with min guard assist;ambulating;regular height toilet Pt Will Perform Tub/Shower Transfer: Shower transfer;Tub transfer;shower seat;rolling walker;ambulating;with mod assist Additional ADL Goal #1: Pt will follow 2 step instruction with 75% accuracy in prep for ADLs  Plan Discharge plan remains appropriate;Frequency remains appropriate    Co-evaluation                 AM-PAC OT "6 Clicks" Daily Activity     Outcome Measure   Help from another person eating meals?: A Little Help from another person taking care of personal grooming?: A Little Help from another person toileting, which includes using toliet, bedpan, or urinal?: A Little Help from another person bathing (including washing, rinsing, drying)?: A Lot Help from another person to put on and taking off regular upper body clothing?: A Lot Help from another person to put on and taking off regular lower body clothing?: A Lot 6 Click  Score: 15    End of Session Equipment Utilized During Treatment: Gait belt  OT Visit Diagnosis: Unsteadiness on feet (R26.81);Other abnormalities of gait and mobility (R26.89);Muscle weakness (generalized) (M62.81);Other symptoms and signs involving cognitive function;Other symptoms and signs involving the nervous system (R29.898)   Activity Tolerance Patient tolerated treatment well   Patient Left in chair;with call bell/phone within reach;with family/visitor present   Nurse Communication Mobility status        Time: 1610-9604 OT Time Calculation (min): 14 min  Charges: OT General Charges $OT Visit: 1 Visit OT Treatments $Therapeutic Activity: 8-22 mins  Alfonzo Beers, OTD, OTR/L Acute Rehab [phone]) [phone]   Mayer Masker 03/11/2022, 4:29 PM

## 2022-03-19 ENCOUNTER — Ambulatory Visit: Payer: 59 | Admitting: Student

## 2022-04-02 ENCOUNTER — Emergency Department (HOSPITAL_COMMUNITY): Payer: 59

## 2022-04-02 ENCOUNTER — Inpatient Hospital Stay (HOSPITAL_COMMUNITY): Payer: 59

## 2022-04-02 ENCOUNTER — Inpatient Hospital Stay (HOSPITAL_COMMUNITY)
Admission: EM | Admit: 2022-04-02 | Discharge: 2022-04-07 | DRG: 101 | Disposition: A | Discharge status: Home / Self Care | Payer: 59 | Attending: Internal Medicine | Admitting: Internal Medicine

## 2022-04-02 ENCOUNTER — Other Ambulatory Visit: Payer: Self-pay

## 2022-04-02 DIAGNOSIS — F1010 Alcohol abuse, uncomplicated: Secondary | ICD-10-CM

## 2022-04-02 DIAGNOSIS — G4700 Insomnia, unspecified: Secondary | ICD-10-CM | POA: Diagnosis present

## 2022-04-02 DIAGNOSIS — R569 Unspecified convulsions: Secondary | ICD-10-CM | POA: Diagnosis not present

## 2022-04-02 DIAGNOSIS — G40901 Epilepsy, unspecified, not intractable, with status epilepticus: Secondary | ICD-10-CM | POA: Diagnosis not present

## 2022-04-02 DIAGNOSIS — Z79899 Other long term (current) drug therapy: Secondary | ICD-10-CM | POA: Diagnosis not present

## 2022-04-02 DIAGNOSIS — E876 Hypokalemia: Secondary | ICD-10-CM | POA: Diagnosis present

## 2022-04-02 DIAGNOSIS — H547 Unspecified visual loss: Secondary | ICD-10-CM | POA: Diagnosis present

## 2022-04-02 DIAGNOSIS — G8929 Other chronic pain: Secondary | ICD-10-CM | POA: Diagnosis present

## 2022-04-02 DIAGNOSIS — F05 Delirium due to known physiological condition: Secondary | ICD-10-CM | POA: Diagnosis present

## 2022-04-02 DIAGNOSIS — K7689 Other specified diseases of liver: Secondary | ICD-10-CM | POA: Diagnosis present

## 2022-04-02 DIAGNOSIS — G40401 Other generalized epilepsy and epileptic syndromes, not intractable, with status epilepticus: Principal | ICD-10-CM | POA: Diagnosis present

## 2022-04-02 DIAGNOSIS — Z9049 Acquired absence of other specified parts of digestive tract: Secondary | ICD-10-CM | POA: Diagnosis not present

## 2022-04-02 DIAGNOSIS — D6959 Other secondary thrombocytopenia: Secondary | ICD-10-CM | POA: Diagnosis present

## 2022-04-02 DIAGNOSIS — Z90710 Acquired absence of both cervix and uterus: Secondary | ICD-10-CM

## 2022-04-02 DIAGNOSIS — G9341 Metabolic encephalopathy: Secondary | ICD-10-CM | POA: Diagnosis not present

## 2022-04-02 DIAGNOSIS — Z981 Arthrodesis status: Secondary | ICD-10-CM

## 2022-04-02 DIAGNOSIS — E872 Acidosis, unspecified: Secondary | ICD-10-CM | POA: Diagnosis present

## 2022-04-02 DIAGNOSIS — E46 Unspecified protein-calorie malnutrition: Secondary | ICD-10-CM | POA: Diagnosis present

## 2022-04-02 DIAGNOSIS — F10239 Alcohol dependence with withdrawal, unspecified: Secondary | ICD-10-CM | POA: Diagnosis present

## 2022-04-02 DIAGNOSIS — F329 Major depressive disorder, single episode, unspecified: Secondary | ICD-10-CM | POA: Diagnosis present

## 2022-04-02 DIAGNOSIS — G621 Alcoholic polyneuropathy: Secondary | ICD-10-CM | POA: Diagnosis present

## 2022-04-02 DIAGNOSIS — F1020 Alcohol dependence, uncomplicated: Secondary | ICD-10-CM | POA: Diagnosis not present

## 2022-04-02 DIAGNOSIS — F17210 Nicotine dependence, cigarettes, uncomplicated: Secondary | ICD-10-CM | POA: Diagnosis present

## 2022-04-02 DIAGNOSIS — F419 Anxiety disorder, unspecified: Secondary | ICD-10-CM | POA: Diagnosis present

## 2022-04-02 LAB — CBC WITH DIFFERENTIAL/PLATELET
Abs Immature Granulocytes: 0.04 10*3/uL (ref 0.00–0.07)
Basophils Absolute: 0 10*3/uL (ref 0.0–0.1)
Basophils Relative: 0 %
Eosinophils Absolute: 0 10*3/uL (ref 0.0–0.5)
Eosinophils Relative: 0 %
HCT: 39 % (ref 36.0–46.0)
Hemoglobin: 13.3 g/dL (ref 12.0–15.0)
Immature Granulocytes: 0 %
Lymphocytes Relative: 8 %
Lymphs Abs: 0.8 10*3/uL (ref 0.7–4.0)
MCH: 33.7 pg (ref 26.0–34.0)
MCHC: 34.1 g/dL (ref 30.0–36.0)
MCV: 98.7 fL (ref 80.0–100.0)
Monocytes Absolute: 0.4 10*3/uL (ref 0.1–1.0)
Monocytes Relative: 4 %
Neutro Abs: 8.6 10*3/uL — ABNORMAL HIGH (ref 1.7–7.7)
Neutrophils Relative %: 88 %
Platelets: 101 10*3/uL — ABNORMAL LOW (ref 150–400)
RBC: 3.95 MIL/uL (ref 3.87–5.11)
RDW: 13 % (ref 11.5–15.5)
WBC: 9.8 10*3/uL (ref 4.0–10.5)
nRBC: 0 % (ref 0.0–0.2)

## 2022-04-02 LAB — ETHANOL: Alcohol, Ethyl (B): <10 mg/dL (ref <10)

## 2022-04-02 LAB — COMPREHENSIVE METABOLIC PANEL
ALT: 14 U/L (ref 0–44)
AST: 27 U/L (ref 15–41)
Albumin: 3.5 g/dL (ref 3.5–5.0)
Alkaline Phosphatase: 63 U/L (ref 38–126)
Anion gap: 16 — ABNORMAL HIGH (ref 5–15)
BUN: 6 mg/dL — ABNORMAL LOW (ref 8–23)
CO2: 22 mmol/L (ref 22–32)
Calcium: 9.1 mg/dL (ref 8.9–10.3)
Chloride: 97 mmol/L — ABNORMAL LOW (ref 98–111)
Creatinine, Ser: 0.71 mg/dL (ref 0.44–1.00)
GFR, Estimated: >60 mL/min (ref >60)
Glucose, Bld: 110 mg/dL — ABNORMAL HIGH (ref 70–99)
Potassium: 2.8 mmol/L — ABNORMAL LOW (ref 3.5–5.1)
Sodium: 135 mmol/L (ref 135–145)
Total Bilirubin: 1.3 mg/dL — ABNORMAL HIGH (ref 0.3–1.2)
Total Protein: 6.3 g/dL — ABNORMAL LOW (ref 6.5–8.1)

## 2022-04-02 LAB — MAGNESIUM: Magnesium: 1.7 mg/dL (ref 1.7–2.4)

## 2022-04-02 LAB — CBG MONITORING, ED: Glucose-Capillary: 126 mg/dL — ABNORMAL HIGH (ref 70–99)

## 2022-04-02 MED ORDER — LORAZEPAM 2 MG/ML IJ SOLN
1.0000 mg | Freq: Once | INTRAMUSCULAR | Status: AC
  Administered 2022-04-02: 1 mg via INTRAVENOUS
  Filled 2022-04-02: qty 1

## 2022-04-02 MED ORDER — LEVETIRACETAM IN NACL 1000 MG/100ML IV SOLN
2000.0000 mg | Freq: Once | INTRAVENOUS | Status: AC
  Administered 2022-04-02: 2000 mg via INTRAVENOUS
  Filled 2022-04-02: qty 200

## 2022-04-02 MED ORDER — LORAZEPAM 1 MG PO TABS: 1.0000 mg | ORAL_TABLET | ORAL | Status: DC | PRN

## 2022-04-02 MED ORDER — ADULT MULTIVITAMIN W/MINERALS CH
1.0000 | ORAL_TABLET | Freq: Every day | ORAL | Status: DC
  Administered 2022-04-04 – 2022-04-07 (×4): 1 via ORAL
  Filled 2022-04-02 (×5): qty 1

## 2022-04-02 MED ORDER — LORAZEPAM 2 MG/ML IJ SOLN
1.0000 mg | INTRAMUSCULAR | Status: DC | PRN
  Administered 2022-04-02: 3 mg via INTRAVENOUS
  Administered 2022-04-03 – 2022-04-04 (×3): 2 mg via INTRAVENOUS
  Filled 2022-04-02: qty 1
  Filled 2022-04-02: qty 2
  Filled 2022-04-02 (×2): qty 1

## 2022-04-02 MED ORDER — LORAZEPAM 2 MG/ML IJ SOLN
2.0000 mg | Freq: Four times a day (QID) | INTRAMUSCULAR | Status: AC
  Administered 2022-04-03 (×4): 2 mg via INTRAVENOUS
  Filled 2022-04-02 (×4): qty 1

## 2022-04-02 MED ORDER — MAGNESIUM SULFATE 2 GM/50ML IV SOLN
2.0000 g | Freq: Once | INTRAVENOUS | Status: AC
  Administered 2022-04-02: 2 g via INTRAVENOUS
  Filled 2022-04-02: qty 50

## 2022-04-02 MED ORDER — THIAMINE HCL 100 MG/ML IJ SOLN
100.0000 mg | Freq: Every day | INTRAMUSCULAR | Status: DC
  Administered 2022-04-03 – 2022-04-06 (×5): 100 mg via INTRAVENOUS
  Filled 2022-04-02 (×5): qty 2

## 2022-04-02 MED ORDER — FOLIC ACID 5 MG/ML IJ SOLN
1.0000 mg | Freq: Every day | INTRAMUSCULAR | Status: DC
Start: 2022-04-02 — End: 2022-04-06
  Administered 2022-04-03 – 2022-04-06 (×4): 1 mg via INTRAVENOUS
  Filled 2022-04-02 (×7): qty 0.2

## 2022-04-02 MED ORDER — SODIUM CHLORIDE 0.9 % IV SOLN: INTRAVENOUS | Status: DC

## 2022-04-02 MED ORDER — POTASSIUM CHLORIDE 10 MEQ/100ML IV SOLN
10.0000 meq | INTRAVENOUS | Status: AC
  Administered 2022-04-02: 10 meq via INTRAVENOUS
  Filled 2022-04-02 (×2): qty 100

## 2022-04-02 MED ORDER — POTASSIUM CHLORIDE 10 MEQ/100ML IV SOLN
10.0000 meq | INTRAVENOUS | Status: DC
  Administered 2022-04-03: 10 meq via INTRAVENOUS

## 2022-04-02 MED ORDER — LEVETIRACETAM IN NACL 500 MG/100ML IV SOLN
500.0000 mg | Freq: Two times a day (BID) | INTRAVENOUS | Status: DC
  Administered 2022-04-03 – 2022-04-06 (×7): 500 mg via INTRAVENOUS
  Filled 2022-04-02 (×8): qty 100

## 2022-04-02 MED ORDER — SODIUM CHLORIDE 0.9 % IV BOLUS
1000.0000 mL | Freq: Once | INTRAVENOUS | Status: AC
  Administered 2022-04-02: 1000 mL via INTRAVENOUS

## 2022-04-02 NOTE — ED Notes (Signed)
  Pt transported to ct 
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
## 2022-04-02 NOTE — ED Provider Notes (Signed)
[facility] EMERGENCY DEPARTMENT Provider Note   CSN: [id number] Arrival date & time: 04/02/22  1741     History  Chief Complaint  Patient presents with   Seizures    [name] is a 66 y.o. female.  Pt is a 66 yo female with a pmhx significant for etoh abuse, depression, anxiety, and alcohol withdrawal seizures.  Pt was admitted from 5/17-5/24 by the IMTS for alcohol w/dr sz.  She was supposed to go to a SNF at d/c.  However, she is at home now.  Pt did have an abnormal EEG while here on last admission, but seizures were treated with ativan as they thought it was all alcohol withdrawal related.   Per EMS, pt has not been drinking for 2 days.  They were told that she had a 1/2 hr seizure last night and had another long seizure this afternoon.  Per EMS, she was seizing when they arrived.  She was given 5 mg versed IV by EMS.  Pt is no longer seizing, but is not back to her normal baseline.  EEG:  IMPRESSION: This study showed evidence of epileptogenicity in left posterior temporal region. There is cortical dysfunction in left hemisphere, maximal left posterior temporal region likely secondary to underlying structural abnormality and high risk of seizure recurrence.     Additionally there is moderate diffuse encephalopathy, nonspecific to etiology. The excessive beta activity is most likely due to benzodiazepine use and is a benign EEG pattern.  No definite seizures were seen during the study.         Home Medications Prior to Admission medications   Medication Sig Start Date End Date Taking? Authorizing Provider  acetaminophen (TYLENOL) 500 MG tablet Take 2 tablets (1,000 mg total) by mouth 3 (three) times daily. 03/11/22   Andrey Campanile, MD  gabapentin (NEURONTIN) 100 MG capsule Take 1 capsule (100 mg total) by mouth 3 (three) times daily. 03/11/22   Andrey Campanile, MD  ondansetron (ZOFRAN ODT) 4 MG disintegrating tablet Take 1 tablet (4 mg total) by  mouth every 8 (eight) hours as needed for nausea or vomiting. 07/21/21   Pricilla Loveless, MD  thiamine 100 MG tablet Take 1 tablet (100 mg total) by mouth daily. 03/12/22   Andrey Campanile, MD  vitamin C (ASCORBIC ACID) 500 MG tablet Take 500 mg by mouth daily.    [provider]  DULoxetine (CYMBALTA) 30 MG capsule Take 1 capsule (30 mg total) by mouth daily. Patient not taking: Reported on 09/01/2020 06/11/20 09/02/20  Money, [name], FNP  metoCLOPramide (REGLAN) 10 MG tablet Take 1 tablet (10 mg total) by mouth every 6 (six) hours as needed for nausea (nausea/headache). 04/06/19 05/24/19  Mesner, Barbara Cower, MD      Allergies    Cephalexin, Cephalosporins, Ketorolac, Prednisone, and Tramadol    Review of Systems   Review of Systems  Unable to perform ROS: Mental status change  All other systems reviewed and are negative.   Physical Exam Updated Vital Signs BP 125/81   Pulse 88   Temp 98.6 F (37 C) (Axillary)   Resp (!) 22   SpO2 100%  Physical Exam Vitals and nursing note reviewed.  Constitutional:      Appearance: Normal appearance.  HENT:     Head: Normocephalic and atraumatic.     Right Ear: External ear normal.     Left Ear: External ear normal.     Nose: Nose normal.  Mouth/Throat:     Mouth: Mucous membranes are dry.  Eyes:     Extraocular Movements: Extraocular movements intact.     Conjunctiva/sclera: Conjunctivae normal.     Pupils: Pupils are equal, round, and reactive to light.  Cardiovascular:     Rate and Rhythm: Regular rhythm. Tachycardia present.     Pulses: Normal pulses.     Heart sounds: Normal heart sounds.  Pulmonary:     Effort: Pulmonary effort is normal.     Breath sounds: Normal breath sounds.  Abdominal:     General: Abdomen is flat. Bowel sounds are normal.     Palpations: Abdomen is soft.  Musculoskeletal:        General: Normal range of motion.     Cervical back: Normal range of motion and neck supple.  Skin:    General:  Skin is warm.     Capillary Refill: Capillary refill takes less than 2 seconds.  Neurological:     Mental Status: She is alert.     Comments: Pt is moving all 4 extremities.  She is not talking.  Unable to assess orientation.  Pt moving all over the bed and not keeping on leads.  Psychiatric:        Speech: She is noncommunicative.     ED Results / Procedures / Treatments   Labs (all labs ordered are listed, but only abnormal results are displayed) Labs Reviewed  COMPREHENSIVE METABOLIC PANEL - Abnormal; Notable for the following components:      Result Value   Potassium 2.8 (*)    Chloride 97 (*)    Glucose, Bld 110 (*)    BUN 6 (*)    Total Protein 6.3 (*)    Total Bilirubin 1.3 (*)    Anion gap 16 (*)    All other components within normal limits  CBC WITH DIFFERENTIAL/PLATELET - Abnormal; Notable for the following components:   Platelets 101 (*)    Neutro Abs 8.6 (*)    All other components within normal limits  CBG MONITORING, ED - Abnormal; Notable for the following components:   Glucose-Capillary 126 (*)    All other components within normal limits  MAGNESIUM  ETHANOL  URINALYSIS, ROUTINE W REFLEX MICROSCOPIC  RAPID URINE DRUG SCREEN, HOSP PERFORMED    EKG EKG Interpretation  Date/Time:  Thursday April 02 2022 18:01:41 EDT Ventricular Rate:  117 PR Interval:  158 QRS Duration: 89 QT Interval:  364 QTC Calculation: 508 R Axis:   -73 Text Interpretation: Sinus tachycardia Left anterior fascicular block Abnormal R-wave progression, early transition Probable LVH with secondary repol abnrm Prolonged QT interval Since last tracing rate faster QTc is prolonged compared to prior Confirmed by Jacalyn Lefevre [phone]) on 04/02/2022 6:11:58 PM  Radiology DG Chest Port 1 View  Result Date: 04/02/2022 CLINICAL DATA:  Pt had a seizure earlier, now is AMS. Hx of seizures Smoker EXAM: PORTABLE CHEST - 1 VIEW COMPARISON:  07/20/2021 FINDINGS: Relatively low lung volumes.  Infrahilar interstitial opacities may represent subsegmental atelectasis versus early infiltrate. Heart size and mediastinal contours are within normal limits. No effusion. Cervical fixation hardware partially visualized. Cholecystectomy clips. IMPRESSION: Low lung volumes with new infrahilar atelectasis or infiltrate. Electronically Signed   By: Corlis Leak M.D.   On: 04/02/2022 18:31    Procedures Procedures    Medications Ordered in ED Medications  sodium chloride 0.9 % bolus 1,000 mL (1,000 mLs Intravenous New Bag/Given 04/02/22 1805)    And  0.9 %  sodium chloride infusion ( Intravenous New Bag/Given 04/02/22 1923)  potassium chloride 10 mEq in 100 mL IVPB (has no administration in time range)  magnesium sulfate IVPB 2 g 50 mL (has no administration in time range)  multivitamin with minerals tablet 1 tablet (has no administration in time range)  folic acid injection 1 mg (has no administration in time range)  thiamine (B-1) injection 100 mg (has no administration in time range)  LORazepam (ATIVAN) injection 1 mg (1 mg Intravenous Given 04/02/22 1806)  LORazepam (ATIVAN) injection 1 mg (1 mg Intravenous Given 04/02/22 1956)  levETIRAcetam (KEPPRA) IVPB 1000 mg/100 mL premix (2,000 mg Intravenous New Bag/Given 04/02/22 1958)    ED Course/ Medical Decision Making/ A&P                           Medical Decision Making Amount and/or Complexity of Data Reviewed Labs: ordered. Radiology: ordered.  Risk Prescription drug management. Decision regarding hospitalization.   This patient presents to the ED for concern of seizure, this involves an extensive number of treatment options, and is a complaint that carries with it a high risk of complications and morbidity.  The differential diagnosis includes epilepsy, alcohol withdrawal, electrolyte abn   Co morbidities that complicate the patient evaluation  etoh abuse, depression, anxiety, and alcohol withdrawal seizures   Additional history  obtained:  Additional history obtained from epic chart review External records from outside source obtained and reviewed including EMS report   Lab Tests:  I Ordered, and personally interpreted labs.  The pertinent results include:  cbc,    Imaging Studies ordered:  I ordered imaging studies including CXR  I independently visualized and interpreted imaging which showed  IMPRESSION:  Low lung volumes with new infrahilar atelectasis or infiltrate.   I agree with the radiologist interpretation   Cardiac Monitoring:  The patient was maintained on a cardiac monitor.  I personally viewed and interpreted the cardiac monitored which showed an underlying rhythm of: sinus tachy   Medicines ordered and prescription drug management:  I ordered medication including ativan  for sedation  Reevaluation of the patient after these medicines showed that the patient improved I have reviewed the patients home medicines and have made adjustments as needed   Test Considered:  Mri, but pt just had one about a month ago   Critical Interventions:  ativan   Consultations Obtained:  I requested consultation with the neurologist (Dr. Otelia Limes),  and discussed lab and imaging findings as well as pertinent plan - he recommends ativan 2 mg IV every 6 hrs for the first day then 1 mg IV every 6 hours after that.  He also recommends 2 g keppra iv now then keppra 500 mg bid.  He will see pt in consult Pt d/w IMTS for admission.  Problem List / ED Course:  Status epilepticus:  keppra and ativan given.  Neurology to see in consult.  Pt admitted to IMTS.   Reevaluation:  After the interventions noted above, I reevaluated the patient and found that they have :stayed the same   Social Determinants of Health:  Lives at home   Dispostion:  After consideration of the diagnostic results and the patients response to treatment, I feel that the patent would benefit from admission.    CRITICAL  CARE Performed by: Jacalyn Lefevre   Total critical care time: 30 minutes  Critical care time was exclusive of separately billable procedures and treating other patients.  Critical care was necessary to treat or prevent imminent or life-threatening deterioration.  Critical care was time spent personally by me on the following activities: development of treatment plan with patient and/or surrogate as well as nursing, discussions with consultants, evaluation of patient's response to treatment, examination of patient, obtaining history from patient or surrogate, ordering and performing treatments and interventions, ordering and review of laboratory studies, ordering and review of radiographic studies, pulse oximetry and re-evaluation of patient's condition.         Final Clinical Impression(s) / ED Diagnoses Final diagnoses:  Hypokalemia  Status epilepticus (HCC)  ETOH abuse    Rx / DC Orders ED Discharge Orders     None         Jacalyn Lefevre, MD 04/02/22 2015

## 2022-04-03 ENCOUNTER — Inpatient Hospital Stay (HOSPITAL_COMMUNITY): Payer: 59

## 2022-04-03 DIAGNOSIS — R569 Unspecified convulsions: Secondary | ICD-10-CM | POA: Diagnosis not present

## 2022-04-03 DIAGNOSIS — E876 Hypokalemia: Secondary | ICD-10-CM

## 2022-04-03 DIAGNOSIS — G40901 Epilepsy, unspecified, not intractable, with status epilepticus: Secondary | ICD-10-CM

## 2022-04-03 DIAGNOSIS — F1010 Alcohol abuse, uncomplicated: Secondary | ICD-10-CM | POA: Diagnosis not present

## 2022-04-03 LAB — TECHNOLOGIST SMEAR REVIEW: Plt Morphology: DECREASED

## 2022-04-03 LAB — CBC
HCT: 38.3 % (ref 36.0–46.0)
Hemoglobin: 12.6 g/dL (ref 12.0–15.0)
MCH: 32.6 pg (ref 26.0–34.0)
MCHC: 32.9 g/dL (ref 30.0–36.0)
MCV: 99.2 fL (ref 80.0–100.0)
Platelets: 104 10*3/uL — ABNORMAL LOW (ref 150–400)
RBC: 3.86 MIL/uL — ABNORMAL LOW (ref 3.87–5.11)
RDW: 13 % (ref 11.5–15.5)
WBC: 6.5 10*3/uL (ref 4.0–10.5)
nRBC: 0 % (ref 0.0–0.2)

## 2022-04-03 LAB — POTASSIUM: Potassium: 3.3 mmol/L — ABNORMAL LOW (ref 3.5–5.1)

## 2022-04-03 LAB — PROTIME-INR
INR: 1 (ref 0.8–1.2)
Prothrombin Time: 12.8 seconds (ref 11.4–15.2)

## 2022-04-03 LAB — BASIC METABOLIC PANEL
Anion gap: 11 (ref 5–15)
BUN: <5 mg/dL — ABNORMAL LOW (ref 8–23)
CO2: 22 mmol/L (ref 22–32)
Calcium: 8.5 mg/dL — ABNORMAL LOW (ref 8.9–10.3)
Chloride: 103 mmol/L (ref 98–111)
Creatinine, Ser: 0.54 mg/dL (ref 0.44–1.00)
GFR, Estimated: >60 mL/min (ref >60)
Glucose, Bld: 85 mg/dL (ref 70–99)
Potassium: 2.9 mmol/L — ABNORMAL LOW (ref 3.5–5.1)
Sodium: 136 mmol/L (ref 135–145)

## 2022-04-03 LAB — PHOSPHORUS: Phosphorus: 3.5 mg/dL (ref 2.5–4.6)

## 2022-04-03 LAB — PATHOLOGIST SMEAR REVIEW

## 2022-04-03 MED ORDER — POTASSIUM CHLORIDE 20 MEQ PO PACK
40.0000 meq | PACK | Freq: Once | ORAL | Status: AC
  Administered 2022-04-03: 40 meq via ORAL
  Filled 2022-04-03: qty 2

## 2022-04-03 MED ORDER — POTASSIUM CHLORIDE 10 MEQ/100ML IV SOLN
10.0000 meq | INTRAVENOUS | Status: AC
  Administered 2022-04-03 (×6): 10 meq via INTRAVENOUS
  Filled 2022-04-03 (×5): qty 100

## 2022-04-03 MED ORDER — LACTATED RINGERS IV SOLN: INTRAVENOUS | Status: DC

## 2022-04-03 MED ORDER — POTASSIUM CHLORIDE 10 MEQ/100ML IV SOLN
INTRAVENOUS | Status: AC
  Administered 2022-04-03: 10 meq
  Filled 2022-04-03: qty 100

## 2022-04-03 MED ORDER — ENSURE ENLIVE PO LIQD
237.0000 mL | Freq: Two times a day (BID) | ORAL | Status: DC
  Administered 2022-04-04 – 2022-04-07 (×6): 237 mL via ORAL

## 2022-04-03 MED ORDER — POTASSIUM CHLORIDE 10 MEQ/100ML IV SOLN
10.0000 meq | INTRAVENOUS | Status: AC
  Administered 2022-04-03 (×2): 10 meq via INTRAVENOUS
  Filled 2022-04-03 (×2): qty 100

## 2022-04-03 MED ORDER — LACTATED RINGERS IV SOLN: INTRAVENOUS | Status: AC

## 2022-04-03 NOTE — Progress Notes (Signed)
  Transition of Care [facility]) Screening Note   Patient Details  Name: [name] Date of Birth: [birth year]   Transition of Care [facility]) CM/SW Contact:    Baldemar Lenis, LCSW Phone Number: 04/03/2022, 10:19 AM    Transition of Care Department [facility]) has reviewed patient, medical workup ongoing. We will continue to monitor patient advancement through interdisciplinary progression rounds. If new patient transition needs arise, please place a TOC consult.

## 2022-04-03 NOTE — Plan of Care (Signed)

## 2022-04-03 NOTE — TOC Initial Note (Signed)
Transition of Care [facility]) - Initial/Assessment Note    Patient Details  Name: [name] MRN: [id number] Date of Birth: [birth year]  Transition of Care [facility]) CM/SW Contact:    Kermit Balo, RN Phone Number: 04/03/2022, 10:19 AM  Clinical Narrative:                 Patient was recently admitted with alcohol use. She was discharged to [facility] for rehab. She lives in [place] with her significant other. Pt currently unable to participate in discharge planning or SA questions/ resources.  TOC will follow.  Expected Discharge Plan: Skilled Nursing Facility Barriers to Discharge: Continued Medical Work up   Patient Goals and CMS Choice        Expected Discharge Plan and Services Expected Discharge Plan: Skilled Nursing Facility In-house Referral: Clinical Social Work Discharge Planning Services: CM Consult   Living arrangements for the past 2 months: Hotel/Motel                                      Prior Living Arrangements/Services Living arrangements for the past 2 months: Hotel/Motel Lives with:: Significant Other Patient language and need for interpreter reviewed:: Yes          Care giver support system in place?: No (comment)   Criminal Activity/Legal Involvement Pertinent to Current Situation/Hospitalization: No - Comment as needed  Activities of Daily Living      Permission Sought/Granted                  Emotional Assessment Appearance:: Appears stated age            Admission diagnosis:  Hypokalemia [E87.6] Status epilepticus (HCC) [G40.901] Seizure (HCC) [R56.9] ETOH abuse [F10.10] Patient Active Problem List   Diagnosis Date Noted   Right sided weakness    Seizure (HCC) 03/04/2022   Alcohol-induced mood disorder (HCC)    Closed displaced oblique fracture of shaft of left humerus 08/01/2020   Diverticulitis 02/29/2020   Acute diverticulitis 02/28/2020   GERD (gastroesophageal reflux disease) 02/28/2020   Suicidal ideation     Alcohol withdrawal syndrome, with delirium (HCC) 12/13/2018   Thrombocytopenia (HCC) 12/13/2018   Homeless 12/13/2018   Depression 12/13/2018   Neuropathy 12/13/2018   Subarachnoid hemorrhage (HCC) 12/11/2018   Seizure due to alcohol withdrawal (HCC) 12/11/2018   Alcohol dependence with alcohol-induced mood disorder (HCC)    MDD (major depressive disorder), recurrent severe, 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 [place] [address] Phone: [phone] Fax: [phone]     Social Determinants of Health (SDOH) Interventions    Readmission Risk Interventions     No data to display

## 2022-04-03 NOTE — Progress Notes (Signed)
PT Cancellation Note  Patient Details Name: [name] MRN: [id number] DOB: January 12, 1956   Cancelled Treatment:    Reason Eval/Treat Not Completed: Medical issues which prohibited therapy; RN reports pt not following commands and very restless.  Just got IV access for seizure meds and needing to replete potassium so not appropriate for PT.  Will attempt another day.   [name] 04/03/2022, 9:49 AM [name], PT Acute Rehabilitation Services Pager:[phone] [phone] 04/03/2022

## 2022-04-03 NOTE — Evaluation (Signed)
Clinical/Bedside Swallow Evaluation Patient Details  Name: [name] MRN: [id number] Date of Birth: 1956-04-01  Today's Date: 04/03/2022 Time: SLP Start Time (ACUTE ONLY): 1338 SLP Stop Time (ACUTE ONLY): 1349 SLP Time Calculation (min) (ACUTE ONLY): 11 min  Past Medical History:  Past Medical History:  Diagnosis Date   Alcohol dependence (HCC)    Anxiety    Chronic pain    Depression    Diverticulitis    Herniated cervical disc    Pancreatitis    Seizures (HCC)    alcoholic seizures   Past Surgical History:  Past Surgical History:  Procedure Laterality Date   ABDOMINAL HYSTERECTOMY     ANKLE SURGERY Right    CARPAL TUNNEL RELEASE Bilateral    CERVICAL FUSION     CHOLECYSTECTOMY     KNEE SURGERY     HPI:  66 y.o. female with a history of EtOH abuse, alcohol-withdrawal seizures, pancreatitis, chronic pain, depression, anxiety and chronic pain, well known to the Neurology service who re-presents with breakthrough seizures.    Assessment / Plan / Recommendation  Clinical Impression  Pt awake but visibly groggy and appears post-ictal with tangential utterances and some intelligibility. She followed commands for oral-motor assessment revealing no cranial nerve deficits but lingual candidias. Pt complained of mouth and throat pain and suspect from thrush. Consecutive cups/straw sips thin consumed x 2 and single sips without s/s aspiration. Oral timeliness with all boluses functional without residue. Given current confusion, mild lethargy recommend easier texture of Dys 2/thin with prognosis good to upgrade when appropriate. Meds whole in puree and assist for feeding if needed- right hand appears endematous. ST will follow. SLP Visit Diagnosis: Dysphagia, unspecified (R13.10)    Aspiration Risk  Mild aspiration risk    Diet Recommendation Thin liquid;Dysphagia 2 (Fine chop)   Liquid Administration via: Cup;Straw Medication Administration: Whole meds with puree Supervision:  Full supervision/cueing for compensatory strategies;Staff to assist with self feeding Compensations: Minimize environmental distractions;Slow rate;Small sips/bites;Lingual sweep for clearance of pocketing Postural Changes: Seated upright at 90 degrees    Other  Recommendations Oral Care Recommendations: Oral care BID    Recommendations for follow up therapy are one component of a multi-disciplinary discharge planning process, led by the attending physician.  Recommendations may be updated based on patient status, additional functional criteria and insurance authorization.  Follow up Recommendations  (TBD)      Assistance Recommended at Discharge    Functional Status Assessment Patient has had a recent decline in their functional status and demonstrates the ability to make significant improvements in function in a reasonable and predictable amount of time.  Frequency and Duration min 2x/week  2 weeks       Prognosis Prognosis for Safe Diet Advancement: Good Barriers to Reach Goals: Cognitive deficits      Swallow Study   General Date of Onset: 04/02/22 HPI: 66 y.o. female with a history of EtOH abuse, alcohol-withdrawal seizures, pancreatitis, chronic pain, depression, anxiety and chronic pain, well known to the Neurology service who re-presents with breakthrough seizures. Type of Study: Bedside Swallow Evaluation Previous Swallow Assessment:  (none) Diet Prior to this Study: NPO Temperature Spikes Noted: No Respiratory Status: Room air History of Recent Intubation: No Behavior/Cognition: Confused;Cooperative (groggy, appears post-ictal) Oral Cavity Assessment: Other (comment) (lingual candidias) Oral Care Completed by SLP: Other (Comment) (RN completed at end of eval) Oral Cavity - Dentition: Missing dentition (has majority) Vision: Functional for self-feeding Self-Feeding Abilities: Needs assist Patient Positioning: Upright in bed Baseline Vocal Quality:  Normal Volitional  Cough: Strong Volitional Swallow: Able to elicit    Oral/Motor/Sensory Function Overall Oral Motor/Sensory Function: Generalized oral weakness   Ice Chips Ice chips: Not tested   Thin Liquid Thin Liquid: Within functional limits Presentation: Straw;Cup    Nectar Thick Nectar Thick Liquid: Not tested   Honey Thick Honey Thick Liquid: Not tested   Puree Puree: Within functional limits   Solid     Solid: Within functional limits      Royce Macadamia 04/03/2022,2:44 PM

## 2022-04-03 NOTE — Progress Notes (Signed)
Initial Nutrition Assessment  DOCUMENTATION CODES:  Not applicable  INTERVENTION:  Continue current diet as oredered per SLP MVI with minerals, thiamine 100mg , and folic acid 1g daily for hx of EtOH abuse Obtain height and weight Ensure Enlive po BID, each supplement provides 350 kcal and 20 grams of protein.  NUTRITION DIAGNOSIS:  Inadequate oral intake related to lethargy/confusion as evidenced by  (pt confused and going through withdrawal).  GOAL:  Patient will meet greater than or equal to 90% of their needs  MONITOR:  PO intake, Supplement acceptance, Labs  REASON FOR ASSESSMENT:  Consult Assessment of nutrition requirement/status  ASSESSMENT:  Pt with hx of anxiety/depression, pancreatitis, and EtOH abuse with hx of withdrawal seizures presented to ED with seizures. Last drink was 2 days prior to admission.   Pt sleeping at the time of assessment. Did not wake to name being called or during physical exam. Mittens in place at this time. Noted that pt is withdrawing and was not able to follow simple commands this AM. Evaluated by SLP and diet was advanced. Will add supplements to augment intake and follow up for physical exam and interview.  Nutritionally Relevant Medications: Scheduled Meds:  folic acid  1 mg Intravenous Daily   multivitamin with minerals  1 tablet Oral Daily   thiamine injection  100 mg Intravenous Daily   Continuous Infusions:  lactated ringers     potassium chloride     Labs Reviewed: K 2.9 BUN <5  NUTRITION - FOCUSED PHYSICAL EXAM: Flowsheet Row Most Recent Value  Orbital Region No depletion  Upper Arm Region No depletion  Thoracic and Lumbar Region No depletion  Buccal Region No depletion  Temple Region Mild depletion  Clavicle Bone Region No depletion  Clavicle and Acromion Bone Region No depletion  Scapular Bone Region Unable to assess  Dorsal Hand Unable to assess  [mittens on both hands]  Patellar Region Mild depletion  Anterior  Thigh Region Mild depletion  Posterior Calf Region Mild depletion  Edema (RD Assessment) None  Hair Reviewed  Eyes Unable to assess  Mouth Unable to assess  Skin Reviewed  Nails Unable to assess   Diet Order:   Diet Order             DIET DYS 2 Room service appropriate? Yes; Fluid consistency: Thin  Diet effective now                   EDUCATION NEEDS:  Not appropriate for education at this time  Skin:  Skin Assessment: Reviewed RN Assessment  Last BM:  unsure  Height:   Ht Readings from Last 1 Encounters:  07/28/21 5\' 1"  (1.549 m)    Weight:   Wt Readings from Last 1 Encounters:  07/28/21 60.8 kg    Ideal Body Weight:  47.7 kg  BMI:  There is no height or weight on file to calculate BMI.  Estimated Nutritional Needs:  Kcal:  1500-1700 kcal/d Protein:  75-90g/d Fluid:  >/=1.5L/d    , RD, LDN Clinical Dietitian RD pager # available in [facility]  After hours/weekend pager # available in [facility]

## 2022-04-03 NOTE — Progress Notes (Signed)
IV team consult for PIV placement. Assessed bilateral arms. Left upper arm has infiltration with edema. Right FA has infiltration with edema. No appropriate vein for PIV placement at this time. Notified other IV team member for 2nd assess.

## 2022-04-03 NOTE — Progress Notes (Signed)
EEG complete - results pending 

## 2022-04-03 NOTE — Progress Notes (Signed)
OT Cancellation Note  Patient Details Name: [name] MRN: [id number] DOB: Oct 07, 1956   Cancelled Treatment:    Reason Eval/Treat Not Completed: Medical issues which prohibited therapy- RN reports pt not following commands and very restless.  Just got IV access for seizure meds and needing to replete potassium so not appropriate for OT. Will follow and see as able/appropriate.   Barry Brunner, OT Acute Rehabilitation Services Office [phone]   Chancy Milroy 04/03/2022, 10:20 AM

## 2022-04-03 NOTE — Progress Notes (Signed)
Neurology Progress Note  Brief HPI: [name] is an 66 y.o. female with a history of EtOH abuse, alcohol-withdrawal seizures, depression, anxiety and chronic pain, well known to the Neurology service who re-presents with breakthrough seizures. 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 She had a 30 min seizure on Wednesday night, followed by another on Thursday afternoon. EMS gave her 5mg  of versed to abort the seizure. EMS was informed that she had gone 2 days without ETOH.    Subjective: EEG being connected and patient is being bathed. She is currently not following commands or assisting in her ADLS.   Exam: Vitals:   04/03/22 0604 04/03/22 0714  BP: (!) 140/104 (!) 144/79  Pulse: 74 63  Resp: 20 20  Temp: (!) 97.5 F (36.4 C) 97.9 F (36.6 C)  SpO2: 100% 99%   Gen: In bed, NAD Resp: non-labored breathing, no acute distress Abd: soft, nt  Mental Status:  Awake and confused, not following commands. States "[name]" when asked name and "[place]" when asked location. After she is reoriented she perseverates on the word "[place]." Not following commands, but will mimic occasionally.   Cranial Nerves: Limited by AMS. Pupils are equal. She will make eye contact and track examiner. Eyes are conjugate near the midline. No nystagmus. Face symmetric. Phonation intact.  Motor/Sensory: Moves BUE and BLE equally to noxious stimuli. No jerking, twitching, myoclonus or posturing noted.  Deep Tendon Reflexes: 1+ and symmetric brachioradialis and patellae. Toes downgoing.  Cerebellar/Gait: Unable to assess  Pertinent Labs: K 2.9- repleted by primary team Blood alcohol level- <10  Imaging Reviewed:  CT- no acute intracranial abnormality  5/19 EEG: Sharp wave, left posterior temporal region; Intermittent rhythmic delta slowing, left posterior temporal region; Continuous slow, left hemisphere, maximal left posterior  temporal region; Excessive beta, generalized.   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  Additionally there is moderate diffuse encephalopathy, nonspecific etiology.  Excessive beta activity is a benign EEG pattern and is most likely due to benzodiazepine use.  Continuous slow, generalized and maximal left posterior quadrant. Spike, left posterior quadrant  Assessment: 66 year old female with a history of EtOH abuse, alcohol-withdrawal seizures, depression, anxiety and chronic pain, well known to the Neurology service who re-presents with breakthrough seizures. Blood alcohol level in the ED was < 10, consistent with withdrawal.   Impression: Breakthrough seizures related to alcohol withdrawl  Recommendations: CIWA protocol with scheduled and PRN benzodiazepine Loaded with Keppra 2000 mg in the ED. Continue with 500 mg BID Frequent neuro checks. Inpatient seizure precautions.  Outpatient seizure precautions: Per [facility] statutes, patients with seizures are not allowed to drive until  they have been seizure-free for six months. Use caution when using heavy equipment or power tools. Avoid working on ladders or at heights. Take showers instead of baths. Ensure the water temperature is not too high on the home water heater. Do not go swimming alone. When caring for infants or small children, sit down when holding, feeding, or changing them to minimize risk of injury to the child in the event you have a seizure. Also, Maintain good sleep hygiene. Avoid alcohol.  Patient seen and examined by NP/APP with MD. MD to update note as needed.   [facility], DNP, FNP-BC [facility] Pager: [phone]

## 2022-04-03 NOTE — Procedures (Addendum)
Patient Name: [name]  MRN: [id number]  Epilepsy Attending: Charlsie Quest  Referring Physician/Provider: Caryl Pina, MD  Date: 04/03/2022 Duration: 24.37 mins  Patient history:  66 y.o. female with a history of EtOH abuse, alcohol-withdrawal seizures, depression, anxiety and chronic pain, well known to the Neurology service who re-presents with breakthrough seizures.  EEG to evaluate for seizure.  Level of alertness: Awake, asleep  AEDs during EEG study: Ativan, LEV  Technical aspects: This EEG study was done with scalp electrodes positioned according to the 10-20 International system of electrode placement. Electrical activity was acquired at a sampling rate of 500Hz  and reviewed with a high frequency filter of 70Hz  and a low frequency filter of 1Hz . EEG data were recorded continuously and digitally stored.   Description: No clear posterior dominant rhythm was seen.  Sleep was characterized by vertex waves and sleep spindles (12 to 14 Hz and (, maximal frontocentral region.  EEG showed continuous generalized and maximal left posterior quadrant 3 to 6 Hz theta and delta slowing admixed with 12 to 14 Hz generalized beta activity.  Abundant spikes were noted in left posterior quadrant, more predominant during sleep.  Hyperventilation and photic stimulation were not performed.     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  Additionally there is moderate diffuse encephalopathy, nonspecific etiology.  Excessive beta activity is a benign EEG pattern and is most likely due to benzodiazepine use.  No seizures were seen throughout the recording.  Hadar Elgersma 
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
## 2022-04-04 ENCOUNTER — Inpatient Hospital Stay (HOSPITAL_COMMUNITY): Payer: 59

## 2022-04-04 DIAGNOSIS — G9341 Metabolic encephalopathy: Secondary | ICD-10-CM

## 2022-04-04 LAB — URINALYSIS, ROUTINE W REFLEX MICROSCOPIC
Bilirubin Urine: NEGATIVE
Glucose, UA: NEGATIVE mg/dL
Hgb urine dipstick: NEGATIVE
Ketones, ur: 5 mg/dL — AB
Leukocytes,Ua: NEGATIVE
Nitrite: NEGATIVE
Protein, ur: NEGATIVE mg/dL
Specific Gravity, Urine: 1.011 (ref 1.005–1.030)
pH: 6 (ref 5.0–8.0)

## 2022-04-04 LAB — BASIC METABOLIC PANEL
Anion gap: 12 (ref 5–15)
BUN: <5 mg/dL — ABNORMAL LOW (ref 8–23)
CO2: 20 mmol/L — ABNORMAL LOW (ref 22–32)
Calcium: 9.1 mg/dL (ref 8.9–10.3)
Chloride: 106 mmol/L (ref 98–111)
Creatinine, Ser: 0.52 mg/dL (ref 0.44–1.00)
GFR, Estimated: >60 mL/min (ref >60)
Glucose, Bld: 76 mg/dL (ref 70–99)
Potassium: 4.6 mmol/L (ref 3.5–5.1)
Sodium: 138 mmol/L (ref 135–145)

## 2022-04-04 LAB — CBC
HCT: 40.1 % (ref 36.0–46.0)
Hemoglobin: 14.2 g/dL (ref 12.0–15.0)
MCH: 34.3 pg — ABNORMAL HIGH (ref 26.0–34.0)
MCHC: 35.4 g/dL (ref 30.0–36.0)
MCV: 96.9 fL (ref 80.0–100.0)
Platelets: 97 10*3/uL — ABNORMAL LOW (ref 150–400)
RBC: 4.14 MIL/uL (ref 3.87–5.11)
RDW: 12.8 % (ref 11.5–15.5)
WBC: 4.6 10*3/uL (ref 4.0–10.5)
nRBC: 0 % (ref 0.0–0.2)

## 2022-04-04 LAB — MAGNESIUM: Magnesium: 1.8 mg/dL (ref 1.7–2.4)

## 2022-04-04 LAB — RAPID URINE DRUG SCREEN, HOSP PERFORMED
Amphetamines: NOT DETECTED
Barbiturates: POSITIVE — AB
Benzodiazepines: POSITIVE — AB
Cocaine: NOT DETECTED
Opiates: NOT DETECTED
Tetrahydrocannabinol: NOT DETECTED

## 2022-04-04 MED ORDER — TRAZODONE HCL 50 MG PO TABS
25.0000 mg | ORAL_TABLET | Freq: Every day | ORAL | Status: DC
  Administered 2022-04-04 – 2022-04-06 (×3): 25 mg via ORAL
  Filled 2022-04-04 (×3): qty 1

## 2022-04-04 NOTE — Progress Notes (Signed)
Neurology Progress Note  Brief HPI: [name] is an 66 y.o. female with a history of EtOH abuse, alcohol-withdrawal seizures, depression, anxiety and chronic pain, well known to the Neurology service who re-presents with breakthrough seizures. 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 She had a 30 min seizure on Wednesday night, followed by another on Thursday afternoon. EMS gave her 5mg  of versed to abort the seizure. EMS was informed that she had gone 2 days without ETOH.    Subjective: EEG being connected and patient is being bathed. She is currently not following commands or assisting in her ADLS.   Exam: Vitals:   04/04/22 1108 04/04/22 1515  BP: (!) 147/90 (!) 144/85  Pulse: 62 69  Resp: 15 16  Temp: 97.6 F (36.4 C) 97.9 F (36.6 C)  SpO2: 99% 100%   Gen: In bed, NAD Resp: non-labored breathing, no acute distress Abd: soft, nt  Mental Status:  somnolent and confused, not following commands. States "Bethene" when asked name. She requires constant reorientation and perseverates on the words. Not following commands.   Cranial Nerves: Limited by AMS. Pupils are equal. She will make eye contact and track examiner with constant sternal rub. Eyes are conjugate near the midline. No nystagmus. Face symmetric. Phonation intact.  Motor/Sensory: Moves BUE and BLE equally to noxious stimuli. No jerking, twitching, myoclonus or posturing noted.  Deep Tendon Reflexes: 1+ and symmetric brachioradialis and patellae. Toes downgoing.  Cerebellar/Gait: Unable to assess  Pertinent Labs: CO2 20  Imaging Reviewed:  CT- no acute intracranial abnormality  5/19 EEG: Sharp wave, left posterior temporal region; Intermittent rhythmic delta slowing, left posterior temporal region; Continuous slow, left hemisphere, maximal left posterior temporal region; Excessive beta, generalized.   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  Additionally there is moderate diffuse encephalopathy, nonspecific etiology. Excessive beta activity is a benign EEG pattern and is most likely due to benzodiazepine use.  Continuous slow, generalized and maximal left posterior quadrant. Spike, left posterior quadrant.  Assessment: 66 year old female with a history of EtOH abuse, alcohol-withdrawal seizures, depression, anxiety and chronic pain, well known to the Neurology service who re-presents with breakthrough seizures. Blood alcohol level in the ED was < 10, consistent with withdrawal.   Impression: Breakthrough seizures related to alcohol withdrawal, metabolic encephalopathy, cannot exclude ongoing seizure.  Recommendations: CIWA protocol with scheduled and PRN benzodiazepine Continue with 500 mg BID. Start cEEG. Frequent neuro checks. Inpatient seizure precautions.      Outpatient seizure precautions: Per [facility] statutes, patients with seizures are not allowed to drive until  they have been seizure-free for six months. Use caution when using heavy equipment or power tools. Avoid working on ladders or at heights. Take showers instead of baths. Ensure the water temperature is not too high on the home water heater. Do not go swimming alone. When caring for infants or small children, sit down when holding, feeding, or changing them to minimize risk of injury to the child in the event you have a seizure. Also, Maintain good sleep hygiene. Avoid alcohol.   Electronically signed by:  [facility], MD Page: Marisue Humble 04/04/2022, 4:20 PM

## 2022-04-04 NOTE — Progress Notes (Signed)
LTM EEG hooked up and running - no initial skin breakdown - push button tested - neuro notified. [facility] monitoring.  

## 2022-04-04 NOTE — Progress Notes (Signed)
OT Cancellation Note  Patient Details Name: [name] MRN: [id number] DOB: [birth year]   Cancelled Treatment:    Reason Eval/Treat Not Completed: Other (comment) (Pt recieved Ativan this AM, RN  requesting therapy follow up later in the day. will follow up as schedule permits.)  Alfonzo Beers, OTD, OTR/L Acute Rehab [phone]   Mayer Masker 04/04/2022, 10:49 AM
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
## 2022-04-04 NOTE — Progress Notes (Signed)
PT Cancellation Note  Patient Details Name: [name] MRN: [id number] DOB: [birth year]   Cancelled Treatment:    Reason Eval/Treat Not Completed: Fatigue/lethargy limiting ability to participate. Pt only responding to noxious stimuli. Pt receiving ativan this AM. PT will continue to f/u with pt acutely to initiate evaluation as available and appropriate.    Alessandra Bevels Hargis Vandyne 04/04/2022, 1:09 PM

## 2022-04-04 NOTE — Evaluation (Signed)
Occupational Therapy Evaluation Patient Details Name: [name] MRN: [id number] DOB: 11-06-1955 Today's Date: 04/04/2022   History of Present Illness Pt is a 65 y/o F presenting to ED on 6/15 from home, re-resenting wtih breakthrough seizures. CT negative. EEG revealing moderate diffuse encephalopathy. Previously admitted 5/17-5/24 by IMT[facility] for alcohol withdrawal seizures. PMH includes EtOH abuse, alcohol-withdrawal seizures, pancreatitis, chronic pain, depression, anxiety.   Clinical Impression   Pt questionable historian, reports receiving min assist at baseline from significant other for ADLs and functional mobility, reports she does manage her own medications at home. Currently does not use AD, has been living with significant other in extended stay hotel after recent SNF d/c.  Pt currently min guard -min A for ADLs, mod I for bed mobility, and close min guard for stand pivot transfers for safety/impulsivity. Pt with decreased cognition, thinking it is 1923 and she is at home, requires frequent cuing for safety during session. Pt presenting with impairments listed below, will follow acutely. Recommend SNF vs HH at d/c pending pt progress.     Recommendations for follow up therapy are one component of a multi-disciplinary discharge planning process, led by the attending physician.  Recommendations may be updated based on patient status, additional functional criteria and insurance authorization.   Follow Up Recommendations  Skilled nursing-short term rehab (<3 hours/day) (vs HH pending progress)    Assistance Recommended at Discharge Frequent or constant Supervision/Assistance  Patient can return home with the following A lot of help with walking and/or transfers;A lot of help with bathing/dressing/bathroom;Assistance with cooking/housework;Direct supervision/assist for medications management;Direct supervision/assist for financial management;Assist for transportation;Help with stairs or  ramp for entrance    Functional Status Assessment  Patient has had a recent decline in their functional status and demonstrates the ability to make significant improvements in function in a reasonable and predictable amount of time.  Equipment Recommendations  None recommended by OT    Recommendations for Other Services PT consult     Precautions / Restrictions Precautions Precautions: Fall Precaution Comments: pt has mitts Restrictions Weight Bearing Restrictions: No      Mobility Bed Mobility Overal bed mobility: Modified Independent             General bed mobility comments: to sit EOB    Transfers Overall transfer level: Needs assistance Equipment used: None Transfers: Sit to/from Stand, Bed to chair/wheelchair/BSC Sit to Stand: Min guard Stand pivot transfers: Min guard         General transfer comment: completed x2      Balance Overall balance assessment: Needs assistance Sitting-balance support: Feet supported, No upper extremity supported Sitting balance-Leahy Scale: Good Sitting balance - Comments: can reach outside BOS without LOB   Standing balance support: Bilateral upper extremity supported, During functional activity, Reliant on assistive device for balance Standing balance-Leahy Scale: Fair Standing balance comment: close min guard A                           ADL either performed or assessed with clinical judgement   ADL Overall ADL's : Needs assistance/impaired     Grooming: Wash/dry hands;Wash/dry face;Supervision/safety Grooming Details (indicate cue type and reason): sitting EOB Upper Body Bathing: Min guard;Sitting   Lower Body Bathing: Min guard;Sitting/lateral leans;Sit to/from stand Lower Body Bathing Details (indicate cue type and reason): to wash BLEs Upper Body Dressing : Minimal assistance;Sitting Upper Body Dressing Details (indicate cue type and reason): to don gown Lower Body Dressing: Supervision/safety;Bed  level Lower Body Dressing Details (indicate cue type and reason): to pull up socks Toilet Transfer: Min Personnel officer Details (indicate cue type and reason): x2 simulated to chair <> bed Toileting- Clothing Manipulation and Hygiene: Sitting/lateral lean;Sit to/from stand;Maximal assistance Toileting - Clothing Manipulation Details (indicate cue type and reason): incontinent episode     Functional mobility during ADLs: Min guard General ADL Comments: no RW used due to pt impulsivity, would benefit from RW during transfers or further ambulation     Vision Baseline Vision/History: 1 Wears glasses Additional Comments: glasses not present at time of evaluation, vision WFL for tasks assessed, will further assess     Perception     Praxis      Pertinent Vitals/Pain Pain Assessment Pain Assessment: Faces Pain Score: 5  Faces Pain Scale: Hurts even more Pain Location: lower lip/mouth Pain Descriptors / Indicators: Discomfort Pain Intervention(s): Monitored during session     Hand Dominance Right   Extremity/Trunk Assessment Upper Extremity Assessment Upper Extremity Assessment: Generalized weakness (pain with R shoulder flexion,)   Lower Extremity Assessment Lower Extremity Assessment: Defer to PT evaluation   Cervical / Trunk Assessment Cervical / Trunk Assessment: Kyphotic   Communication Communication Communication: Expressive difficulties (slow, jumbled speech at times)   Cognition Arousal/Alertness: Awake/alert Behavior During Therapy: Impulsive Overall Cognitive Status: Impaired/Different from baseline Area of Impairment: Attention, Memory, Following commands, Safety/judgement, Awareness, Problem solving                 Orientation Level: Disoriented to, Time, Situation, Place (thinks it is 1923 and she is at home) Current Attention Level: Sustained Memory: Decreased short-term memory Following Commands: Follows one step commands  with increased time, Follows one step commands inconsistently Safety/Judgement: Decreased awareness of safety, Decreased awareness of deficits Awareness: Intellectual Problem Solving: Slow processing, Requires verbal cues, Requires tactile cues, Difficulty sequencing General Comments: perseverative about getting her medicine throughout session     General Comments  VSS on RA    Exercises     Shoulder Instructions      Home Living Family/patient expects to be discharged to:: Other (Comment)                                 Additional Comments: Pt has been staying at an extended stay hotel since d/c from SNF with her significant other [name].      Prior Functioning/Environment Prior Level of Function : Needs assist             Mobility Comments: reports no AD use and also reports using cane, fiance provides handheld assist at times ADLs Comments: reports [name] (significant other) has assisted with ADLs/IADLs, unable to further elaborate.        OT Problem List: Decreased strength;Decreased range of motion;Decreased activity tolerance;Impaired balance (sitting and/or standing);Decreased coordination;Decreased cognition;Decreased safety awareness;Decreased knowledge of use of DME or AE;Impaired UE functional use      OT Treatment/Interventions: Self-care/ADL training;Therapeutic exercise;Therapeutic activities;DME and/or AE instruction;Visual/perceptual remediation/compensation;Patient/family education;Balance training;Cognitive remediation/compensation    OT Goals(Current goals can be found in the care plan section) Acute Rehab OT Goals Patient Stated Goal: to take her medicine OT Goal Formulation: With patient Time For Goal Achievement: 04/18/22 Potential to Achieve Goals: Good ADL Goals Pt Will Transfer to Toilet: with modified independence;ambulating;regular height toilet Pt Will Perform Tub/Shower Transfer: with modified independence;ambulating;rolling  walker Additional ADL Goal #1: pt will complete 4 step trailmaking task accurately in  prep for ADLs Additional ADL Goal #2: Pt will receive passing score on pillbox test to promote independence with med mgmt  OT Frequency: Min 2X/week    Co-evaluation              AM-PAC OT "6 Clicks" Daily Activity     Outcome Measure Help from another person eating meals?: A Little Help from another person taking care of personal grooming?: A Little Help from another person toileting, which includes using toliet, bedpan, or urinal?: Total Help from another person bathing (including washing, rinsing, drying)?: A Lot Help from another person to put on and taking off regular upper body clothing?: A Little Help from another person to put on and taking off regular lower body clothing?: A Little 6 Click Score: 15   End of Session Nurse Communication: Mobility status  Activity Tolerance: Patient tolerated treatment well Patient left: in bed;with call bell/phone within reach;with bed alarm set  OT Visit Diagnosis: Unsteadiness on feet (R26.81);Other abnormalities of gait and mobility (R26.89);Muscle weakness (generalized) (M62.81);Other symptoms and signs involving cognitive function;Other symptoms and signs involving the nervous system (R29.898)                Time: 1431-1501 OT Time Calculation (min): 30 min Charges:  OT General Charges $OT Visit: 1 Visit OT Evaluation $OT Eval Moderate Complexity: 1 Mod OT Treatments $Self Care/Home Management : 8-22 mins  Alfonzo Beers, OTD, OTR/L Acute Rehab [phone]) [phone]  Mayer Masker 04/04/2022, 4:45 PM

## 2022-04-05 DIAGNOSIS — F1020 Alcohol dependence, uncomplicated: Secondary | ICD-10-CM

## 2022-04-05 LAB — CBC
HCT: 38.4 % (ref 36.0–46.0)
Hemoglobin: 13 g/dL (ref 12.0–15.0)
MCH: 33.8 pg (ref 26.0–34.0)
MCHC: 33.9 g/dL (ref 30.0–36.0)
MCV: 99.7 fL (ref 80.0–100.0)
Platelets: 148 10*3/uL — ABNORMAL LOW (ref 150–400)
RBC: 3.85 MIL/uL — ABNORMAL LOW (ref 3.87–5.11)
RDW: 13 % (ref 11.5–15.5)
WBC: 4.9 10*3/uL (ref 4.0–10.5)
nRBC: 0 % (ref 0.0–0.2)

## 2022-04-05 LAB — BASIC METABOLIC PANEL
Anion gap: 11 (ref 5–15)
BUN: 5 mg/dL — ABNORMAL LOW (ref 8–23)
CO2: 21 mmol/L — ABNORMAL LOW (ref 22–32)
Calcium: 8.9 mg/dL (ref 8.9–10.3)
Chloride: 106 mmol/L (ref 98–111)
Creatinine, Ser: 0.47 mg/dL (ref 0.44–1.00)
GFR, Estimated: >60 mL/min (ref >60)
Glucose, Bld: 90 mg/dL (ref 70–99)
Potassium: 3.4 mmol/L — ABNORMAL LOW (ref 3.5–5.1)
Sodium: 138 mmol/L (ref 135–145)

## 2022-04-05 LAB — PHOSPHORUS: Phosphorus: 4.8 mg/dL — ABNORMAL HIGH (ref 2.5–4.6)

## 2022-04-05 LAB — MAGNESIUM: Magnesium: 1.7 mg/dL (ref 1.7–2.4)

## 2022-04-05 MED ORDER — POTASSIUM CHLORIDE 20 MEQ PO PACK
40.0000 meq | PACK | Freq: Once | ORAL | Status: AC
  Administered 2022-04-05: 40 meq via ORAL
  Filled 2022-04-05: qty 2

## 2022-04-05 MED ORDER — ENOXAPARIN SODIUM 40 MG/0.4ML IJ SOSY
40.0000 mg | PREFILLED_SYRINGE | INTRAMUSCULAR | Status: DC
  Administered 2022-04-05 – 2022-04-06 (×2): 40 mg via SUBCUTANEOUS
  Filled 2022-04-05 (×2): qty 0.4

## 2022-04-05 MED ORDER — MAGNESIUM SULFATE 2 GM/50ML IV SOLN
2.0000 g | Freq: Once | INTRAVENOUS | Status: AC
  Administered 2022-04-05: 2 g via INTRAVENOUS
  Filled 2022-04-05: qty 50

## 2022-04-05 MED ORDER — POTASSIUM CHLORIDE 10 MEQ/100ML IV SOLN
10.0000 meq | INTRAVENOUS | Status: AC
  Administered 2022-04-05 (×4): 10 meq via INTRAVENOUS
  Filled 2022-04-05 (×2): qty 100

## 2022-04-05 NOTE — Plan of Care (Signed)
  Problem: Education: Goal: Knowledge of General Education information will improve Description: Including pain rating scale, medication(s)/side effects and non-pharmacologic comfort measures Outcome: Not Progressing   Problem: Health Behavior/Discharge Planning: Goal: Ability to manage health-related needs will improve Outcome: Not Progressing   Problem: Health Behavior/Discharge Planning: Goal: Ability to manage health-related needs will improve Outcome: Not Progressing   Problem: Health Behavior/Discharge Planning: Goal: Ability to manage health-related needs will improve Outcome: Not Progressing   Problem: Clinical Measurements: Goal: Ability to maintain clinical measurements within normal limits will improve Outcome: Not Progressing Goal: Will remain free from infection Outcome: Progressing Goal: Respiratory complications will improve Outcome: Not Applicable Goal: Cardiovascular complication will be avoided Outcome: Progressing   Problem: Activity: Goal: Risk for activity intolerance will decrease Outcome: Progressing

## 2022-04-05 NOTE — Evaluation (Signed)
Physical Therapy Evaluation Patient Details Name: [name] MRN: [id number] DOB: [birth year] Today's Date: 04/05/2022  History of Present Illness  Pt is a 66 y/o F presenting to ED on 6/15 from home, re-resenting wtih breakthrough seizures. CT negative. EEG revealing moderate diffuse encephalopathy. Previously admitted 5/17-5/24 by [facility]MT[facility] for alcohol withdrawal seizures. PMH includes EtOH abuse, alcohol-withdrawal seizures, pancreatitis, chronic pain, depression, anxiety.  Clinical Impression  Patient admitted with the above. PTA, patient living in hotel with fiance and reports independence. Patient currently presents with weakness, impaired balance, decreased activity tolerance, and impaired cognition. Eval limited due to LTM EEG running. Able to come to EOB modI and stand with min guard. Performed sit to stand x 5 with min guard. Able to take steps forward/backward with min guard and no overt LOB. Will progress once disconnected from EEG. Patient will benefit from skilled PT services during acute stay to address listed deficits. Recommend SNF at this time due to cognitive, strength, and balance deficits.        Recommendations for follow up therapy are one component of a multi-disciplinary discharge planning process, led by the attending physician.  Recommendations may be updated based on patient status, additional functional criteria and insurance authorization.  Follow Up Recommendations Skilled nursing-short term rehab (<3 hours/day)    Assistance Recommended at Discharge Frequent or constant Supervision/Assistance  Patient can return home with the following  A little help with walking and/or transfers;A little help with bathing/dressing/bathroom;Assistance with cooking/housework;Direct supervision/assist for medications management;Direct supervision/assist for financial management;Assist for transportation;Help with stairs or ramp for entrance    Equipment Recommendations None  recommended by PT  Recommendations for Other Services       Functional Status Assessment Patient has had a recent decline in their functional status and demonstrates the ability to make significant improvements in function in a reasonable and predictable amount of time.     Precautions / Restrictions Precautions Precautions: Fall Precaution Comments: EEG Restrictions Weight Bearing Restrictions: No      Mobility  Bed Mobility Overal bed mobility: Modified Independent                  Transfers Overall transfer level: Needs assistance Equipment used: None Transfers: Sit to/from Stand Sit to Stand: Min guard           General transfer comment: min guard for safety. Able to complete sit to stand x 5 from EOB and standing marching    Ambulation/Gait Ambulation/Gait assistance: Min guard Gait Distance (Feet): 3 Feet Assistive device: None Gait Pattern/deviations: Step-through pattern, Decreased stride length Gait velocity: decreased     General Gait Details: able to take steps forward and backward. limited by EEG. Min guard for Wellsite geologist    Modified Rankin (Stroke Patients Only)       Balance Overall balance assessment: Needs assistance Sitting-balance support: Feet supported, No upper extremity supported Sitting balance-Leahy Scale: Good     Standing balance support: No upper extremity supported, During functional activity Standing balance-Leahy Scale: Fair                               Pertinent Vitals/Pain Pain Assessment Pain Assessment: No/denies pain    Home Living Family/patient expects to be discharged to:: Other (Comment)                   Additional Comments:  Pt has been staying at an extended stay hotel since d/c from SNF with her significant other [name].    Prior Function Prior Level of Function : Needs assist             Mobility Comments: reports no AD use and  also reports using cane, fiance provides handheld assist at times ADLs Comments: reports [name] (significant other) has assisted with ADLs/IADLs, unable to further elaborate.     Hand Dominance        Extremity/Trunk Assessment   Upper Extremity Assessment Upper Extremity Assessment: Defer to OT evaluation    Lower Extremity Assessment Lower Extremity Assessment: Generalized weakness RLE Sensation: history of peripheral neuropathy    Cervical / Trunk Assessment Cervical / Trunk Assessment: Kyphotic  Communication   Communication: Expressive difficulties (slow, jumbled speech at times)  Cognition Arousal/Alertness: Awake/alert Behavior During Therapy: Impulsive Overall Cognitive Status: Impaired/Different from baseline Area of Impairment: Attention, Memory, Following commands, Safety/judgement, Awareness, Problem solving                 Orientation Level: Disoriented to, Time Current Attention Level: Sustained Memory: Decreased short-term memory Following Commands: Follows one step commands with increased time, Follows one step commands inconsistently Safety/Judgement: Decreased awareness of safety, Decreased awareness of deficits Awareness: Intellectual Problem Solving: Slow processing, Requires verbal cues, Requires tactile cues, Difficulty sequencing General Comments: slow processing and increased time to follow commands appropriately.        General Comments General comments (skin integrity, edema, etc.): VSS on RA; no seizure like activity    Exercises     Assessment/Plan    PT Assessment Patient needs continued PT services  PT Problem List Decreased strength;Decreased activity tolerance;Decreased balance;Decreased mobility;Decreased coordination;Decreased cognition;Decreased knowledge of use of DME;Decreased safety awareness;Decreased knowledge of precautions;Pain;Impaired sensation       PT Treatment Interventions DME instruction;Gait training;Functional  mobility training;Therapeutic activities;Therapeutic exercise;Balance training;Neuromuscular re-education;Cognitive remediation;Patient/family education    PT Goals (Current goals can be found in the Care Plan section)  Acute Rehab PT Goals Patient Stated Goal: to eat lunch PT Goal Formulation: With patient Time For Goal Achievement: 04/19/22 Potential to Achieve Goals: Good    Frequency Min 2X/week     Co-evaluation               AM-PAC PT "6 Clicks" Mobility  Outcome Measure Help needed turning from your back to your side while in a flat bed without using bedrails?: None Help needed moving from lying on your back to sitting on the side of a flat bed without using bedrails?: None Help needed moving to and from a bed to a chair (including a wheelchair)?: A Little Help needed standing up from a chair using your arms (e.g., wheelchair or bedside chair)?: A Little Help needed to walk in hospital room?: A Little Help needed climbing 3-5 steps with a railing? : A Lot 6 Click Score: 19    End of Session   Activity Tolerance: Patient tolerated treatment well Patient left: in bed;with call bell/phone within reach;with bed alarm set Nurse Communication: Mobility status PT Visit Diagnosis: Unsteadiness on feet (R26.81);Difficulty in walking, not elsewhere classified (R26.2);Muscle weakness (generalized) (M62.81)    Time: 6712-4580 PT Time Calculation (min) (ACUTE ONLY): 15 min   Charges:   PT Evaluation $PT Eval Moderate Complexity: 1 Mod          Wendall Isabell A. Dan Humphreys PT, DPT Acute Rehabilitation Services Office [phone]   Viviann Spare 04/05/2022, 12:39 PM

## 2022-04-05 NOTE — Procedures (Signed)
Patient Name: [name]  MRN: [id number]  Epilepsy Attending: Charlsie Quest  Referring Physician/Provider: Elmer Picker, NP  Duration: 04/04/2022 1654 to 04/05/2022 1654   Patient history:  66 y.o. female with a history of EtOH abuse, alcohol-withdrawal seizures, depression, anxiety and chronic pain, well known to the Neurology service who re-presents with breakthrough seizures.  EEG to evaluate for seizure.   Level of alertness: Awake, asleep   AEDs during EEG study: LEV   Technical aspects: This EEG study was done with scalp electrodes positioned according to the 10-20 International system of electrode placement. Electrical activity was acquired at a sampling rate of 500Hz  and reviewed with a high frequency filter of 70Hz  and a low frequency filter of 1Hz . EEG data were recorded continuously and digitally stored.    Description: The posterior dominant rhythm consists of 9-10 Hz activity of moderate voltage (25-35 uV) seen predominantly in posterior head regions, asymmetric ( left<right) and reactive to eye opening and eye closing.   Sleep was characterized by vertex waves and sleep spindles (12 to 14 Hz and (, maximal frontocentral region.  EEG showed continuous 3 to 6 Hz theta and delta in left posterior quadrant. Additionally there is 12 to 14 Hz generalized beta activity. Spikes were noted in left posterior quadrant, more predominant during sleep.  Hyperventilation and photic stimulation were not performed.      ABNORMALITY - Continuous slow, left posterior quadrant - Spike, left posterior quadrant - Excessive beta, generalized - Background asymmetry, left<right   IMPRESSION: This study is consistent with patient's history of epilepsy and cortical dysfunction arising from left posterior quadrant.  Additionally there is moderate diffuse encephalopathy, nonspecific etiology.  Excessive beta activity is a benign EEG pattern and is most likely due to benzodiazepine use.  No seizures  were seen throughout the recording.   Naveh Rickles 
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
## 2022-04-05 NOTE — Progress Notes (Signed)
Neurology Progress Note  Brief HPI: [name] is an 66 y.o. female with a history of EtOH abuse, alcohol-withdrawal seizures, depression, anxiety and chronic pain, well known to the Neurology service who re-presents with breakthrough seizures. 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 She had a 30 min seizure on Wednesday night, followed by another on Thursday afternoon. EMS gave her 5mg  of versed to abort the seizure. EMS was informed that she had gone 2 days without ETOH.    Subjective: EEG LTM remains in place. Today she is Aox4 and sitting up eating lunch. Ativan has been discontinued. Remains on Keppra 500mg  BID.  Exam: Vitals:   04/05/22 0738 04/05/22 1119  BP: 113/79 123/77  Pulse: 71 73  Resp: 16 16  Temp: 97.7 F (36.5 C) 98.4 F (36.9 C)  SpO2: 100% 100%   Gen: In bed, NAD Resp: non-labored breathing, no acute distress Abd: soft, nt  Mental Status:  Aox4, speech is clear. Does not remember seizure or first couple of days in the hospital.  Cranial Nerves: Pupils are equal. Eyes are conjugate near the midline. Hearing intact, speech is clear. No nystagmus. Face symmetric. Phonation intact.  Motor/Sensory: Moves BUE and BLE equally to noxious stimuli. No jerking, twitching, myoclonus or posturing noted.  Deep Tendon Reflexes: 1+ and symmetric brachioradialis and patellae. Toes downgoing.  Cerebellar/Gait: Unable to assess  Pertinent Labs: K- 3.4  Imaging Reviewed:  CT- no acute intracranial abnormality  5/19 EEG: Sharp wave, left posterior temporal region; Intermittent rhythmic delta slowing, left posterior temporal region; Continuous slow, left hemisphere, maximal left posterior temporal region; Excessive beta, generalized.   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  Additionally there is moderate diffuse encephalopathy,  nonspecific etiology. Excessive beta activity is a benign EEG pattern and is most likely due to benzodiazepine use.  Continuous slow, generalized and maximal left posterior quadrant. Spike, left posterior quadrant.  6/18- This study is consistent with patient's history of epilepsy and cortical dysfunction arising from left posterior quadrant.  Additionally there is moderate diffuse encephalopathy, nonspecific etiology.  Excessive beta activity is a benign EEG pattern and is most likely due to benzodiazepine use.  No seizures were seen throughout the recording.    Assessment: 65 year old female with a history of EtOH abuse, alcohol-withdrawal seizures, depression, anxiety and chronic pain, well known to the Neurology service who re-presents with breakthrough seizures. Blood alcohol level in the ED was < 10, consistent with withdrawal.   Impression: Breakthrough seizures related to alcohol withdrawal, metabolic encephalopathy, cannot exclude ongoing seizure.  Recommendations: Discontinue ativan Continue with 500 mg BID. Frequent neuro checks. Inpatient seizure precautions.  Continue EEG   Outpatient seizure precautions: Per [facility] statutes, patients with seizures are not allowed to drive until  they have been seizure-free for six months. Use caution when using heavy equipment or power tools. Avoid working on ladders or at heights. Take showers instead of baths. Ensure the water temperature is not too high on the home water heater. Do not go swimming alone. When caring for infants or small children, sit down when holding, feeding, or changing them to minimize risk of injury to the child in the event you have a seizure. Also, Maintain good sleep hygiene. Avoid alcohol.  Patient seen and examined by NP/APP with MD. MD to update note as needed.   71, DNP, FNP-BC [facility] Pager: [phone]   

## 2022-04-06 LAB — BASIC METABOLIC PANEL
Anion gap: 9 (ref 5–15)
BUN: 5 mg/dL — ABNORMAL LOW (ref 8–23)
CO2: 17 mmol/L — ABNORMAL LOW (ref 22–32)
Calcium: 8.6 mg/dL — ABNORMAL LOW (ref 8.9–10.3)
Chloride: 109 mmol/L (ref 98–111)
Creatinine, Ser: 0.52 mg/dL (ref 0.44–1.00)
GFR, Estimated: >60 mL/min (ref >60)
Glucose, Bld: 96 mg/dL (ref 70–99)
Potassium: 4.1 mmol/L (ref 3.5–5.1)
Sodium: 135 mmol/L (ref 135–145)

## 2022-04-06 LAB — MAGNESIUM: Magnesium: 2 mg/dL (ref 1.7–2.4)

## 2022-04-06 MED ORDER — GABAPENTIN 100 MG PO CAPS: 100.0000 mg | ORAL_CAPSULE | Freq: Three times a day (TID) | ORAL | Status: DC

## 2022-04-06 MED ORDER — THIAMINE HCL 100 MG PO TABS
100.0000 mg | ORAL_TABLET | Freq: Every day | ORAL | Status: DC
  Administered 2022-04-07: 100 mg via ORAL
  Filled 2022-04-06: qty 1

## 2022-04-06 MED ORDER — LEVETIRACETAM 500 MG PO TABS
500.0000 mg | ORAL_TABLET | Freq: Two times a day (BID) | ORAL | Status: DC
  Administered 2022-04-06 – 2022-04-07 (×2): 500 mg via ORAL
  Filled 2022-04-06 (×2): qty 1

## 2022-04-06 MED ORDER — FOLIC ACID 1 MG PO TABS
1.0000 mg | ORAL_TABLET | Freq: Every day | ORAL | Status: DC
  Administered 2022-04-07: 1 mg via ORAL
  Filled 2022-04-06: qty 1

## 2022-04-06 NOTE — Procedures (Addendum)
Patient Name: [name]  MRN: [id number]  Epilepsy Attending: Charlsie Quest  Referring Physician/Provider: Elmer Picker, NP  Duration: 04/05/2022 1654 to 04/06/2022 1104   Patient history:  66 y.o. female with a history of EtOH abuse, alcohol-withdrawal seizures, depression, anxiety and chronic pain, well known to the Neurology service who re-presents with breakthrough seizures.  EEG to evaluate for seizure.   Level of alertness: Awake, asleep   AEDs during EEG study: LEV   Technical aspects: This EEG study was done with scalp electrodes positioned according to the 10-20 International system of electrode placement. Electrical activity was acquired at a sampling rate of 500Hz  and reviewed with a high frequency filter of 70Hz  and a low frequency filter of 1Hz . EEG data were recorded continuously and digitally stored.    Description: The posterior dominant rhythm consists of 9-10 Hz activity of moderate voltage (25-35 uV) seen predominantly in posterior head regions, asymmetric ( left<right) and reactive to eye opening and eye closing.   Sleep was characterized by vertex waves and sleep spindles (12 to 14 Hz and (, maximal frontocentral region.  EEG showed continuous 3 to 6 Hz theta and delta in left posterior quadrant. Additionally there is 12 to 14 Hz generalized beta activity. Spikes were noted in left posterior quadrant, more predominant during sleep.  Hyperventilation and photic stimulation were not performed.      ABNORMALITY - Continuous slow, left posterior quadrant - Spike, left posterior quadrant - Excessive beta, generalized - Background asymmetry, left<right   IMPRESSION: This study is consistent with patient's history of epilepsy and cortical dysfunction arising from left posterior quadrant.  Additionally there is moderate diffuse encephalopathy, nonspecific etiology.  Excessive beta activity is a benign EEG pattern and is most likely due to benzodiazepine use.  No seizures  were seen throughout the recording.   Kaliah Haddaway 

## 2022-04-06 NOTE — Progress Notes (Signed)
LTM EEG disconnected - no skin breakdown at unhook. [facility] notified.   
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
## 2022-04-06 NOTE — Progress Notes (Addendum)
Subjective: No further seizures overnight.  Patient states she has been drinking alcohol because he has had multiple surgeries and is in chronic pain.  Also reports she has been depressed.  States she has not been seeing any primary care physician.  ROS: negative except above  Examination  Vital signs in last 24 hours: Temp:  [97.8 F (36.6 C)-98.9 F (37.2 C)] 98.5 F (36.9 C) (06/19 1223) Pulse Rate:  [68-88] 68 (06/19 1223) Resp:  [16-20] 17 (06/19 1223) BP: (105-127)/(67-88) 127/88 (06/19 1223) SpO2:  [98 %-100 %] 100 % (06/19 1223)  General: lying in bed, NAD Neuro: MS: Alert, oriented, follows commands, attention span intact, no aphasia CN: pupils equal and reactive,  EOMI, face symmetric, tongue midline, normal sensation over face, Motor: 5/5 strength in all 4 extremities   Basic Metabolic Panel: Recent Labs  Lab 04/02/22 1846 04/03/22 0211 04/03/22 0553 04/03/22 1441 04/04/22 0703 04/05/22 0345 04/06/22 0412  NA 135  --  136  --  138 138 135  K 2.8*  --  2.9* 3.3* 4.6 3.4* 4.1  CL 97*  --  103  --  106 106 109  CO2 22  --  22  --  20* 21* 17*  GLUCOSE 110*  --  85  --  76 90 96  BUN 6*  --  <5*  --  <5* 5* 5*  CREATININE 0.71  --  0.54  --  0.52 0.47 0.52  CALCIUM 9.1  --  8.5*  --  9.1 8.9 8.6*  MG 1.7  --   --   --  1.8 1.7 2.0  PHOS  --  3.5  --   --   --  4.8*  --     CBC: Recent Labs  Lab 04/02/22 1846 04/03/22 0553 04/04/22 0703 04/05/22 0345  WBC 9.8 6.5 4.6 4.9  NEUTROABS 8.6*  --   --   --   HGB 13.3 12.6 14.2 13.0  HCT 39.0 38.3 40.1 38.4  MCV 98.7 99.2 96.9 99.7  PLT 101* 104* 97* 148*     Coagulation Studies: No results for input(s): "LABPROT", "INR" in the last 72 hours.  Imaging CT head without contrast 04/02/2022: No acute abnormality.   ASSESSMENT AND PLAN: 66 year old female with history of epilepsy, alcohol use disorder who presented with breakthrough seizures in the setting of alcohol withdrawal.  Epilepsy with  breakthrough seizure Alcohol use disorder with alcohol withdrawal Chronic pain -No further seizures on EEG.   Recommendations -Recommend Keppra 500 mg twice daily at the time of discharge. -Continue gabapentin 100 mg 3 times daily -Discussed seizure provoking factors including alcohol use, sleep deprivation, infection, medication noncompliance -Patient does not drive.  Discussed seizure precautions -Defer management of other comorbidities to primary team -Recommend follow-up with neurology in 3 months -Discussed plan with medicine team at bedside  Seizure precautions: Per [facility] statutes, patients with seizures are not allowed to drive until they have been seizure-free for six months and cleared by a physician    Use caution when using heavy equipment or power tools. Avoid working on ladders or at heights. Take showers instead of baths. Ensure the water temperature is not too high on the home water heater. Do not go swimming alone. Do not lock yourself in a room alone (i.e. bathroom). When caring for infants or small children, sit down when holding, feeding, or changing them to minimize risk of injury to the child in the event you have a seizure. Maintain  good sleep hygiene. Avoid alcohol.    If patient has another seizure, call 911 and bring them back to the ED if: A.  The seizure lasts longer than 5 minutes.      B.  The patient doesn't wake shortly after the seizure or has new problems such as difficulty seeing, speaking or moving following the seizure C.  The patient was injured during the seizure D.  The patient has a temperature over 102 F (39C) E.  The patient vomited during the seizure and now is having trouble breathing    During the Seizure   - First, ensure adequate ventilation and place patients on the floor on their left side  Loosen clothing around the neck and ensure the airway is patent. If the patient is clenching the teeth, do not force the mouth open with  any object as this can cause severe damage - Remove all items from the surrounding that can be hazardous. The patient may be oblivious to what's happening and may not even know what he or she is doing. If the patient is confused and wandering, either gently guide him/her away and block access to outside areas - Reassure the individual and be comforting - Call 911. In most cases, the seizure ends before EMS arrives. However, there are cases when seizures may last over 3 to 5 minutes. Or the individual may have developed breathing difficulties or severe injuries. If a pregnant patient or a person with diabetes develops a seizure, it is prudent to call an ambulance. - Finally, if the patient does not regain full consciousness, then call EMS. Most patients will remain confused for about 45 to 90 minutes after a seizure, so you must use judgment in calling for help.     After the Seizure (Postictal Stage)   After a seizure, most patients experience confusion, fatigue, muscle pain and/or a headache. Thus, one should permit the individual to sleep. For the next few days, reassurance is essential. Being calm and helping reorient the person is also of importance.   Most seizures are painless and end spontaneously. Seizures are not harmful to others but can lead to complications such as stress on the lungs, brain and the heart. Individuals with prior lung problems may develop labored breathing and respiratory distress.    I have spent a total of 36   minutes with the patient reviewing hospital notes,  test results, labs and examining the patient as well as establishing an assessment and plan that was discussed personally with the patient.  > 50% of time was spent in direct patient care.     Lindie Spruce Epilepsy [facility] For questions after 5pm please refer to AMION to reach the Neurologist on call
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
## 2022-04-06 NOTE — Care Management Important Message (Signed)
Important Message  Patient Details  Name: [name] MRN: [id number] Date of Birth: Sep 18, 1956   Medicare Important Message Given:  Yes     Mardene Sayer 04/06/2022, 2:44 PM

## 2022-04-07 ENCOUNTER — Other Ambulatory Visit (HOSPITAL_COMMUNITY): Payer: Self-pay

## 2022-04-07 DIAGNOSIS — G40901 Epilepsy, unspecified, not intractable, with status epilepticus: Secondary | ICD-10-CM

## 2022-04-07 LAB — CBC
HCT: 36 % (ref 36.0–46.0)
Hemoglobin: 12.2 g/dL (ref 12.0–15.0)
MCH: 34.1 pg — ABNORMAL HIGH (ref 26.0–34.0)
MCHC: 33.9 g/dL (ref 30.0–36.0)
MCV: 100.6 fL — ABNORMAL HIGH (ref 80.0–100.0)
Platelets: 220 10*3/uL (ref 150–400)
RBC: 3.58 MIL/uL — ABNORMAL LOW (ref 3.87–5.11)
RDW: 13.1 % (ref 11.5–15.5)
WBC: 5 10*3/uL (ref 4.0–10.5)
nRBC: 0 % (ref 0.0–0.2)

## 2022-04-07 LAB — BASIC METABOLIC PANEL
Anion gap: 8 (ref 5–15)
BUN: 7 mg/dL — ABNORMAL LOW (ref 8–23)
CO2: 21 mmol/L — ABNORMAL LOW (ref 22–32)
Calcium: 9.1 mg/dL (ref 8.9–10.3)
Chloride: 109 mmol/L (ref 98–111)
Creatinine, Ser: 0.49 mg/dL (ref 0.44–1.00)
GFR, Estimated: >60 mL/min (ref >60)
Glucose, Bld: 101 mg/dL — ABNORMAL HIGH (ref 70–99)
Potassium: 3.5 mmol/L (ref 3.5–5.1)
Sodium: 138 mmol/L (ref 135–145)

## 2022-04-07 LAB — VITAMIN B12: Vitamin B-12: 607 pg/mL (ref 180–914)

## 2022-04-07 LAB — MAGNESIUM: Magnesium: 1.8 mg/dL (ref 1.7–2.4)

## 2022-04-07 MED ORDER — NALTREXONE HCL 50 MG PO TABS
50.0000 mg | ORAL_TABLET | Freq: Every day | ORAL | 0 refills | Status: AC
  Filled 2022-04-07: qty 30, 30d supply, fill #0

## 2022-04-07 MED ORDER — LEVETIRACETAM 500 MG PO TABS
500.0000 mg | ORAL_TABLET | Freq: Two times a day (BID) | ORAL | 0 refills | Status: DC
  Filled 2022-04-07: qty 60, 30d supply, fill #0

## 2022-04-07 MED ORDER — GABAPENTIN 100 MG PO CAPS
100.0000 mg | ORAL_CAPSULE | Freq: Three times a day (TID) | ORAL | 0 refills | Status: DC
  Filled 2022-04-07: qty 90, 30d supply, fill #0

## 2022-04-07 MED ORDER — FLUOXETINE HCL 20 MG PO CAPS
20.0000 mg | ORAL_CAPSULE | Freq: Every day | ORAL | 0 refills | Status: AC
  Filled 2022-04-07: qty 30, 30d supply, fill #0

## 2022-04-07 NOTE — Discharge Summary (Signed)
Name: [name] MRN: [id number] DOB: [birth year] 66 y.o. PCP: [facility]  Date of Admission: 04/02/2022  5:41 PM Date of Discharge: 04/07/2022 Attending Physician: Mercie Eon, MD  Discharge Diagnosis: 1.  Alcohol use disorder 2.  Seizure disorder 3.  Depression 4.  Hypokalemia 5.  Malnutrition 7.  Non-anion gap metabolic acidosis 8.  Thrombocytopenia 9.  Alcohol induced neuropathy  Discharge Medications: Allergies as of 04/07/2022       Reactions   Keflex [cephalexin] Hives   Cephalosporins Hives   Prednisone Swelling   Toradol [ketorolac Tromethamine] Hives   Ultram [tramadol] Hives        Medication List     STOP taking these medications    acetaminophen 500 MG tablet Commonly known as: TYLENOL   ondansetron 4 MG disintegrating tablet Commonly known as: Zofran ODT   thiamine 100 MG tablet   vitamin C 500 MG tablet Commonly known as: ASCORBIC ACID       TAKE these medications    FLUoxetine 20 MG capsule Commonly known as: PROZAC Take 1 capsule (20 mg total) by mouth daily.   gabapentin 100 MG capsule Commonly known as: NEURONTIN Take 1 capsule (100 mg total) by mouth 3 (three) times daily.   levETIRAcetam 500 MG tablet Commonly known as: KEPPRA Take 1 tablet (500 mg total) by mouth 2 (two) times daily.   naltrexone 50 MG tablet Commonly known as: DEPADE Take 1 tablet (50 mg total) by mouth daily.        Disposition and follow-up:   EmilyEmily Harrison was discharged from [facility] in Stable condition.  At the hospital follow up visit please address:  1.   Alcohol Use Disorder -Assess compliance with Medication Assisted Therapy (naltrexone)  -Assess alcohol use and provide resources as needed -CMP to assess liver function  Seizure Disorder -Assess if any further seizures -Assess compliance with Keppra -Follow-up with outpatient neurology in 3 months for seizure disorder  management  Depression -PHQ-9 for MDD -Titrate Prozac as needed -Psychiatry/Therapy referral if warranted  Hypokalemia -Mg and CMP  -Replete as needed  Malnutrition/NAGMA -Assess if having diarrhea/proper PO hydration/intake -Assess if any significant weight changes  Thrombocytopenia -CBC and workup if needed (suspected to be due to alcohol use)  Alcohol induced Neuropathy -Titrate gabapentin dosage as needed  2.  Labs / imaging needed at time of follow-up: CBC, CMP  3.  Pending labs/ test needing follow-up: none  Follow-up Appointments:   Hospital Course by problem list: [name] is a 66 y.o.female with a history of epilepsy, depression and alcohol use disorder who was admitted to [facility] for breakthrough seizures in the setting of alcohol withdrawal. Hospital course is detailed below:  Seizure Disorder Alcohol Use Disorder Patient was admitted after having a 30-minute witnessed seizure followed by a tonic-clonic seizure in the ED.  Patient was given IV Versed which successfully terminated her seizure.  Per collateral, patient had relapse on alcohol and then suddenly ceased drinking alcohol.  This appears to be most likely a etiology of patient's seizures; however, EEG in the ED also showed evidence of structural abnormality that may also have contributed to seizure recurrence.  Patient was started on scheduled Ativan and tapered off this in order to manage alcohol withdrawal. Patient was encephalopathic for approximately 2 days prior to returning to her baseline mental state.  Patient did not show any signs of focal neurological deficits of did not suspect stroke at this time.  Given  that this was patient's second seizure in the past month, patient was started on Keppra 500 mg twice daily and was recommended to continue this to prevent further seizures.  Physical therapy and Occupational Therapy recommended that patient go to home health PT/OT in order to increase mobility  and strength; however, patient was uninterested in this at this time.  Patient was discharged to home (hotel). Patient was discharged with prescription of naltrexone and Keppra for medication assisted therapy for alcohol use and seizure disorder.  #Depression Patient attributes her relapse in alcohol due to his depression.  Patient had previously been on Prozac.  Patient was discharged with prescription in order to prevent relapse as well as manage depressive symptoms.  #Hypokalemia Patient's potassium was low at 2.8 during this admission.  This was repleted up to 3.5 at discharge.  #Thromboyctopenia Platelets were low during this admission and suspected to be likely related to her alcohol use.  Smear showed stomatocytes which are concerning for liver dysfunction but PT/INR were normal and patient's platelet count began to normalize.  Platelet count at discharge was 220.  #Alcohol induced Neuropathy Patient's gabapentin was restarted at discharge.   Discharge Exam:   BP 111/74 (BP Location: Right Arm)   Pulse 78   Temp 98.5 F (36.9 C) (Oral)   Resp 20   SpO2 99%  Discharge exam:  General: Sitting up in recliner, NAD Neuro: Alert oriented and able to follow commands.  Attention span intact, no aphasia.  Cranial nerves grossly intact, motor 5 out of 5 strength in all 4 extremities.  Sensory symmetrical and intact bilaterally.  Pertinent Labs, Studies, and Procedures:  Overnight EEG with video  Result Date: 04/05/2022 Lora Havens, MD     04/06/2022  9:56 AM Patient Name: [name] MRN: [id number] Epilepsy Attending: Lora Havens Referring Physician/Provider: Janine Ores, NP Duration: 04/04/2022 1654 to 04/05/2022 1654  Patient history:  66 y.o. female with a history of EtOH abuse, alcohol-withdrawal seizures, depression, anxiety and chronic pain, well known to the Neurology service who re-presents with breakthrough seizures.  EEG to evaluate for seizure.  Level of alertness:  Awake, asleep  AEDs during EEG study: LEV  Technical aspects: This EEG study was done with scalp electrodes positioned according to the 10-20 International system of electrode placement. Electrical activity was acquired at a sampling rate of 500Hz  and reviewed with a high frequency filter of 70Hz  and a low frequency filter of 1Hz . EEG data were recorded continuously and digitally stored.  Description: The posterior dominant rhythm consists of 9-10 Hz activity of moderate voltage (25-35 uV) seen predominantly in posterior head regions, asymmetric ( left<right) and reactive to eye opening and eye closing.   Sleep was characterized by vertex waves and sleep spindles (12 to 14 Hz and (, maximal frontocentral region.  EEG showed continuous 3 to 6 Hz theta and delta in left posterior quadrant. Additionally there is 12 to 14 Hz generalized beta activity. Spikes were noted in left posterior quadrant, more predominant during sleep.  Hyperventilation and photic stimulation were not performed.    ABNORMALITY - Continuous slow, left posterior quadrant - Spike, left posterior quadrant - Excessive beta, generalized - Background asymmetry, left<right  IMPRESSION: This study is consistent with patient's history of epilepsy and cortical dysfunction arising from left posterior quadrant.  Additionally there is moderate diffuse encephalopathy, nonspecific etiology.  Excessive beta activity is a benign EEG pattern and is most likely due to benzodiazepine use.  No seizures were seen throughout  the recording.  Lora Havens   EEG adult  Result Date: 04/03/2022 Lora Havens, MD     04/03/2022 10:03 AM Patient Name: [name] MRN: [id number] Epilepsy Attending: Lora Havens Referring Physician/Provider: Kerney Elbe, MD Date: 04/03/2022 Duration: 24.37 mins Patient history:  66 y.o. female with a history of EtOH abuse, alcohol-withdrawal seizures, depression, anxiety and chronic pain, well known to the Neurology service  who re-presents with breakthrough seizures.  EEG to evaluate for seizure. Level of alertness: Awake, asleep AEDs during EEG study: Ativan, LEV Technical aspects: This EEG study was done with scalp electrodes positioned according to the 10-20 International system of electrode placement. Electrical activity was acquired at a sampling rate of 500Hz  and reviewed with a high frequency filter of 70Hz  and a low frequency filter of 1Hz . EEG data were recorded continuously and digitally stored. Description: No clear posterior dominant rhythm was seen.  Sleep was characterized by vertex waves and sleep spindles (12 to 14 Hz and (, maximal frontocentral region.  EEG showed continuous generalized and maximal left posterior quadrant 3 to 6 Hz theta and delta slowing admixed with 12 to 14 Hz generalized beta activity.  Abundant spikes were noted in left posterior quadrant, more predominant during sleep.  Hyperventilation and photic stimulation were not performed.   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  Additionally there is moderate diffuse encephalopathy, nonspecific etiology.  Excessive beta activity is a benign EEG pattern and is most likely due to benzodiazepine use.  No seizures were seen throughout the recording. Lora Havens   CT HEAD WO CONTRAST  Result Date: 04/02/2022 CLINICAL DATA:  Seizure. EXAM: CT HEAD WITHOUT CONTRAST TECHNIQUE: Contiguous axial images were obtained from the base of the skull through the vertex without intravenous contrast. RADIATION DOSE REDUCTION: This exam was performed according to the departmental dose-optimization program which includes automated exposure control, adjustment of the mA and/or kV according to patient size and/or use of iterative reconstruction technique.  COMPARISON:  Mar 04, 2022. FINDINGS: Brain: Mild diffuse cortical atrophy is noted. No mass effect or midline shift is noted. Ventricular size is within normal limits. There is no evidence of mass lesion, hemorrhage or acute infarction. Vascular: No hyperdense vessel or unexpected calcification. Skull: Normal. Negative for fracture or focal lesion. Sinuses/Orbits: No acute finding. Other: None. IMPRESSION: No acute intracranial abnormality seen. Electronically Signed   By: Marijo Conception M.D.   On: 04/02/2022 21:02   DG Chest Port 1 View  Result Date: 04/02/2022 CLINICAL DATA:  Pt had a seizure earlier, now is AMS. Hx of seizures Smoker EXAM: PORTABLE CHEST - 1 VIEW COMPARISON:  07/20/2021 FINDINGS: Relatively low lung volumes. Infrahilar interstitial opacities may represent subsegmental atelectasis versus early infiltrate. Heart size and mediastinal contours are within normal limits. No effusion. Cervical fixation hardware partially visualized. Cholecystectomy clips. IMPRESSION: Low lung volumes with new infrahilar atelectasis or infiltrate. Electronically Signed   By: Lucrezia Europe M.D.   On: 04/02/2022 18:31       Latest Ref Rng & Units 04/07/2022   12:47 AM 04/05/2022    3:45 AM 04/04/2022    7:03 AM  CBC  WBC 4.0 - 10.5 K/uL 5.0  4.9  4.6   Hemoglobin 12.0 - 15.0 g/dL 12.2  13.0  14.2   Hematocrit 36.0 - 46.0 % 36.0  38.4  40.1   Platelets 150 - 400 K/uL 220  148  97       Latest Ref Rng & Units 04/07/2022   12:47 AM 04/06/2022    4:12 AM 04/05/2022    3:45 AM  CMP  Glucose 70 - 99 mg/dL 161  96  90   BUN 8 - 23 mg/dL 7  5  5    Creatinine 0.44 - 1.00 mg/dL  0.96  0.45   Sodium 135 - 145 mmol/L 138  135  138   Potassium 3.5 - 5.1 mmol/L 3.5  4.1  3.4   Chloride 98 - 111 mmol/L 109  109  106   CO2 22 - 32 mmol/L 21  17  21    Calcium 8.9 - 10.3 mg/dL 9.1  8.6  8.9     Discharge Instructions: Discharge Instructions     Call MD for:  difficulty breathing, headache or visual  disturbances   Complete by: As directed    Call MD for:  persistant dizziness or light-headedness   Complete by: As directed    Call MD for:  persistant nausea and vomiting   Complete by: As directed    Call MD for:  redness, tenderness, or signs of infection (pain, swelling, redness, odor or green/yellow discharge around incision site)   Complete by: As directed    Call MD for:  severe uncontrolled pain   Complete by: As directed    Call MD for:  temperature >100.4   Complete by: As directed    Diet - low sodium heart healthy   Complete by: As directed    Discharge instructions   Complete by: As directed    Dear [name],  It was a pleasure taking care of you while you are in the hospital.  You were admitted due to having multiple seizures likely due to your alcohol use and suddenly stopping them.  Please refrain from drinking any alcohol as you are at very high risk for seizures.  We have started you on a medication called Keppra which helps prevent further seizures.  Please take this every day and continue to take your other medications as prescribed.  If you should run out of Keppra, please call the neurologist for refill.  If you have worsening of any symptoms, please return to the ED. please follow-up with your primary care provider, neurologist and behavioral health for further follow-up.  We resume Prozac to help with your depression.  We also started you on naltrexone.  This medication will help reduce your alcohol craving.  Please take 1 tablet daily.  Your appointment with the [facility] Medicine [facility] at [facility] is Monday 04/20/2022 at 1:15 PM.  If you cannot make it to the appointment, please call [phone].  Per [facility] statutes, patients with seizures are not allowed to drive until  they have been seizure-free for six months. Use caution when using heavy equipment or power tools. Avoid working on ladders or at heights. Take showers instead of baths. Ensure  the water temperature is not too high on the home water heater. Do not go swimming alone. When caring for infants or small children, sit down when holding, feeding, or changing them to minimize risk of injury to the child in the event you have a seizure.   Also, Maintain good sleep hygiene. Avoid alcohol.  Take Care! -[place] [facility]   Increase activity slowly   Complete by: As directed  Signed: France Ravens, MD 04/07/2022, 10:09 AM   Pager: [phone]

## 2022-04-07 NOTE — TOC Transition Note (Signed)
Transition of Care [facility]) - CM/SW Discharge Note   Patient Details  Name: [name] MRN: [id number] Date of Birth: [birth year]  Transition of Care [facility]) CM/SW Contact:  Kermit Balo, RN Phone Number: 04/07/2022, 11:04 AM   Clinical Narrative:    Patient is discharging back to her motel: [name]. She lives there with her boyfriend. Pt was recently discharged to [facility] but left AMA after 2 days.  Recommendations are for home health. Pt lives in [place] and frequently uses ETOH. Cm unable to arrange [facility] for pt. MD aware.  Medications for home to be delivered to the room per [facility]  pharmacy.  CM will provide beside RN with cab voucher for transport to the motel.   Final next level of care: Home/Self Care Barriers to Discharge: No Barriers Identified   Patient Goals and CMS Choice        Discharge Placement                       Discharge Plan and Services In-house Referral: Clinical Social Work Discharge Planning Services: CM Consult                                 Social Determinants of Health (SDOH) Interventions     Readmission Risk Interventions     No data to display

## 2022-04-07 NOTE — Progress Notes (Signed)
SLP Cancellation Note  Patient Details Name: Akeria Hedstrom MRN: [id number] DOB: May 07, 1956   Cancelled treatment:       Reason Eval/Treat Not Completed: SLP screened, no needs identified, will sign off. Pt fully alert, walking with PT. Pt reports she likes her food in DYs 2 texture because she has a dental problem. SLP will keep current diet and sign off given improvement in arousal.    Matisse Roskelley, Riley Nearing 04/07/2022, 9:57 AM

## 2022-04-07 NOTE — Progress Notes (Signed)
Physical Therapy Treatment Patient Details Name: [name] MRN: [id number] DOB: Oct 04, 1956 Today's Date: 04/07/2022   History of Present Illness Pt is a 66 y/o F presenting to ED on 6/15 from home, re-resenting wtih breakthrough seizures. CT negative. EEG revealing moderate diffuse encephalopathy. Previously admitted 5/17-5/24 by IMTS for alcohol withdrawal seizures. PMH includes EtOH abuse, alcohol-withdrawal seizures, pancreatitis, chronic pain, depression, anxiety.    PT Comments    Pt progressing well functionally. Pt with cognitive deficits that  raises concerns for ability to care for self however suspect this to be near baseline due to longstanding ETOH abuse and chronic seizures. Pt demo's improved balance and ambulation tolerance but unable to follow multistep commands, navigate hallway back to her room, poor short term memory, and impaired executive functioning. Acute PT to cont to follow.    Recommendations for follow up therapy are one component of a multi-disciplinary discharge planning process, led by the attending physician.  Recommendations may be updated based on patient status, additional functional criteria and insurance authorization.  Follow Up Recommendations  Home health PT (to address cognitive impairments from functional stand point)     Assistance Recommended at Discharge Frequent or constant Supervision/Assistance  Patient can return home with the following Assist for transportation;Direct supervision/assist for medications management;Direct supervision/assist for financial management;A little help with bathing/dressing/bathroom   Equipment Recommendations  None recommended by PT    Recommendations for Other Services       Precautions / Restrictions Precautions Precautions: Fall Restrictions Weight Bearing Restrictions: No     Mobility  Bed Mobility               General bed mobility comments: pt was sitting EOB eating breakfast upon  arrival    Transfers Overall transfer level: Needs assistance Equipment used: None Transfers: Sit to/from Stand Sit to Stand: Modified independent (Device/Increase time)           General transfer comment: slow/guarded to move but steady, no physical assist    Ambulation/Gait Ambulation/Gait assistance: Supervision Gait Distance (Feet): 300 Feet Assistive device: None Gait Pattern/deviations: Step-through pattern Gait velocity: decreased Gait velocity interpretation: >2.62 ft/sec, indicative of community ambulatory   General Gait Details: pt with no episodes of LOB or instability however unable to navigate hallways without verbal cues due to impaired memory, vision impairement (pt states she needs glasses but they're at the motel". Pt with poor STM, pt told her room number multiple times but unable to remember or find it, attempted to go into another pt's room but then realized it wasn't hers   Stairs             Wheelchair Mobility    Modified Rankin (Stroke Patients Only) Modified Rankin (Stroke Patients Only) Pre-Morbid Rankin Score: No symptoms Modified Rankin: Moderate disability     Balance Overall balance assessment: Needs assistance Sitting-balance support: Feet supported, No upper extremity supported Sitting balance-Leahy Scale: Good     Standing balance support: No upper extremity supported, During functional activity Standing balance-Leahy Scale: Good                 High Level Balance Comments: pt requiring minA anteriorly to ambulate backwards x 25', minA for side stepping 20' to L and R. Attempted to ambulate heel/toe tandomly however pt unable without maxA to prevent fall Standardized Balance Assessment Standardized Balance Assessment : Dynamic Gait Index   Dynamic Gait Index Level Surface: Normal Change in Gait Speed: Mild Impairment Gait with Horizontal Head Turns: Normal Gait with  Vertical Head Turns: Normal Gait and Pivot Turn:  Mild Impairment Step Over Obstacle: Mild Impairment Step Around Obstacles: Normal Steps: Mild Impairment Total Score: 20      Cognition Arousal/Alertness: Awake/alert Behavior During Therapy: WFL for tasks assessed/performed Overall Cognitive Status: No family/caregiver present to determine baseline cognitive functioning Area of Impairment: Attention, Memory, Following commands, Safety/judgement, Awareness, Problem solving, Orientation                 Orientation Level: Disoriented to, Time (pt required verbal cues to reoriented to day of week and date) Current Attention Level: Sustained Memory: Decreased short-term memory Following Commands: Follows multi-step commands inconsistently, Follows one step commands consistently Safety/Judgement: Decreased awareness of safety Awareness: Emergent Problem Solving: Slow processing, Requires verbal cues, Requires tactile cues, Difficulty sequencing General Comments: unsure of baseline level of cognition but suspect it to be impaired due to long standing ETOH abuse and freq seizures, pt reports "I am in in a major depression. I'm dealing with a lot of death." Pt reports she has 2 children who don't want anything to do with her because she is a "drunk" she states "I don't blame them. Pt reports trying rehab 14 times and states "If it didn't work then it isn't going to work now." Pt unable to multitask, follow multistep commands, problem solve simple math        Exercises      General Comments General comments (skin integrity, edema, etc.): VSS      Pertinent Vitals/Pain Pain Assessment Pain Assessment: No/denies pain    Home Living                          Prior Function            PT Goals (current goals can now be found in the care plan section) Acute Rehab PT Goals PT Goal Formulation: With patient Time For Goal Achievement: 04/19/22 Potential to Achieve Goals: Good Progress towards PT goals: Progressing toward  goals    Frequency    Min 2X/week      PT Plan Discharge plan needs to be updated    Co-evaluation              AM-PAC PT "6 Clicks" Mobility   Outcome Measure  Help needed turning from your back to your side while in a flat bed without using bedrails?: None Help needed moving from lying on your back to sitting on the side of a flat bed without using bedrails?: None Help needed moving to and from a bed to a chair (including a wheelchair)?: A Little Help needed standing up from a chair using your arms (e.g., wheelchair or bedside chair)?: A Little Help needed to walk in hospital room?: A Little Help needed climbing 3-5 steps with a railing? : A Little 6 Click Score: 20    End of Session Equipment Utilized During Treatment: Gait belt Activity Tolerance: Patient tolerated treatment well Patient left: with call bell/phone within reach;in chair;with chair alarm set Nurse Communication: Mobility status PT Visit Diagnosis: Unsteadiness on feet (R26.81);Difficulty in walking, not elsewhere classified (R26.2);Muscle weakness (generalized) (M62.81)     Time: 8546-2703 PT Time Calculation (min) (ACUTE ONLY): 26 min  Charges:  $Gait Training: 23-37 mins                     Lewis Shock, PT, DPT Acute Rehabilitation Services Secure chat preferred Office #: [phone]    Rozell Searing  Cristel Rail 04/07/2022, 9:03 AM

## 2022-04-07 NOTE — TOC Transition Note (Signed)
Transition of Care [facility]) - CM/SW Discharge Note   Patient Details  Name: Any Mcneice MRN: [id number] Date of Birth: 1956-08-14  [facility] CM/SW Contact:  Kermit Balo, RN Phone Number: 04/07/2022, 10:56 AM   Clinical Narrative:    Patient admits to alcohol use and the need to quit. CM has provided her with inpatient/ outpatient alcohol counseling resources.      Barriers to Discharge: Continued Medical Work up   Patient Goals and CMS Choice        Discharge Placement                       Discharge Plan and Services In-house Referral: Clinical Social Work Discharge Planning Services: CM Consult                                 Social Determinants of Health (SDOH) Interventions     Readmission Risk Interventions     No data to display

## 2022-04-20 ENCOUNTER — Encounter: Payer: 59 | Admitting: Student

## 2022-05-17 ENCOUNTER — Emergency Department (HOSPITAL_COMMUNITY): Payer: 59

## 2022-05-17 ENCOUNTER — Inpatient Hospital Stay (HOSPITAL_COMMUNITY)
Admission: EM | Admit: 2022-05-17 | Discharge: 2022-05-24 | DRG: 896 | Disposition: A | Discharge status: Skilled Nursing Facility | Payer: 59 | Attending: Family Medicine | Admitting: Family Medicine

## 2022-05-17 DIAGNOSIS — I82613 Acute embolism and thrombosis of superficial veins of upper extremity, bilateral: Secondary | ICD-10-CM | POA: Diagnosis present

## 2022-05-17 DIAGNOSIS — F1020 Alcohol dependence, uncomplicated: Secondary | ICD-10-CM | POA: Diagnosis not present

## 2022-05-17 DIAGNOSIS — G40901 Epilepsy, unspecified, not intractable, with status epilepticus: Secondary | ICD-10-CM | POA: Diagnosis not present

## 2022-05-17 DIAGNOSIS — M7989 Other specified soft tissue disorders: Secondary | ICD-10-CM | POA: Diagnosis not present

## 2022-05-17 DIAGNOSIS — Z79899 Other long term (current) drug therapy: Secondary | ICD-10-CM

## 2022-05-17 DIAGNOSIS — W19XXXA Unspecified fall, initial encounter: Secondary | ICD-10-CM | POA: Diagnosis present

## 2022-05-17 DIAGNOSIS — Z91148 Patient's other noncompliance with medication regimen for other reason: Secondary | ICD-10-CM | POA: Diagnosis present

## 2022-05-17 DIAGNOSIS — F10239 Alcohol dependence with withdrawal, unspecified: Principal | ICD-10-CM | POA: Diagnosis present

## 2022-05-17 DIAGNOSIS — Z885 Allergy status to narcotic agent status: Secondary | ICD-10-CM

## 2022-05-17 DIAGNOSIS — R4182 Altered mental status, unspecified: Secondary | ICD-10-CM | POA: Diagnosis not present

## 2022-05-17 DIAGNOSIS — R4701 Aphasia: Secondary | ICD-10-CM | POA: Diagnosis present

## 2022-05-17 DIAGNOSIS — Z20822 Contact with and (suspected) exposure to covid-19: Secondary | ICD-10-CM | POA: Diagnosis present

## 2022-05-17 DIAGNOSIS — Z886 Allergy status to analgesic agent status: Secondary | ICD-10-CM | POA: Diagnosis not present

## 2022-05-17 DIAGNOSIS — G8929 Other chronic pain: Secondary | ICD-10-CM | POA: Diagnosis present

## 2022-05-17 DIAGNOSIS — H5500 Unspecified nystagmus: Secondary | ICD-10-CM | POA: Diagnosis present

## 2022-05-17 DIAGNOSIS — G40909 Epilepsy, unspecified, not intractable, without status epilepticus: Secondary | ICD-10-CM

## 2022-05-17 DIAGNOSIS — R569 Unspecified convulsions: Secondary | ICD-10-CM | POA: Diagnosis not present

## 2022-05-17 DIAGNOSIS — Z881 Allergy status to other antibiotic agents status: Secondary | ICD-10-CM

## 2022-05-17 DIAGNOSIS — G9341 Metabolic encephalopathy: Secondary | ICD-10-CM | POA: Diagnosis present

## 2022-05-17 DIAGNOSIS — F1024 Alcohol dependence with alcohol-induced mood disorder: Secondary | ICD-10-CM | POA: Diagnosis present

## 2022-05-17 DIAGNOSIS — Z981 Arthrodesis status: Secondary | ICD-10-CM

## 2022-05-17 DIAGNOSIS — Z90710 Acquired absence of both cervix and uterus: Secondary | ICD-10-CM | POA: Diagnosis not present

## 2022-05-17 DIAGNOSIS — R739 Hyperglycemia, unspecified: Secondary | ICD-10-CM | POA: Diagnosis present

## 2022-05-17 DIAGNOSIS — E876 Hypokalemia: Secondary | ICD-10-CM | POA: Diagnosis present

## 2022-05-17 DIAGNOSIS — F17210 Nicotine dependence, cigarettes, uncomplicated: Secondary | ICD-10-CM | POA: Diagnosis present

## 2022-05-17 DIAGNOSIS — Z888 Allergy status to other drugs, medicaments and biological substances status: Secondary | ICD-10-CM

## 2022-05-17 DIAGNOSIS — E8809 Other disorders of plasma-protein metabolism, not elsewhere classified: Secondary | ICD-10-CM | POA: Diagnosis present

## 2022-05-17 DIAGNOSIS — Y92000 Kitchen of unspecified non-institutional (private) residence as  the place of occurrence of the external cause: Secondary | ICD-10-CM | POA: Diagnosis not present

## 2022-05-17 DIAGNOSIS — M549 Dorsalgia, unspecified: Secondary | ICD-10-CM | POA: Diagnosis present

## 2022-05-17 DIAGNOSIS — F32A Depression, unspecified: Secondary | ICD-10-CM | POA: Diagnosis present

## 2022-05-17 DIAGNOSIS — F419 Anxiety disorder, unspecified: Secondary | ICD-10-CM | POA: Diagnosis present

## 2022-05-17 LAB — I-STAT CHEM 8, ED
BUN: 6 mg/dL — ABNORMAL LOW (ref 8–23)
Calcium, Ion: 1.11 mmol/L — ABNORMAL LOW (ref 1.15–1.40)
Chloride: 104 mmol/L (ref 98–111)
Creatinine, Ser: 0.6 mg/dL (ref 0.44–1.00)
Glucose, Bld: 158 mg/dL — ABNORMAL HIGH (ref 70–99)
HCT: 41 % (ref 36.0–46.0)
Hemoglobin: 13.9 g/dL (ref 12.0–15.0)
Potassium: 4.4 mmol/L (ref 3.5–5.1)
Sodium: 136 mmol/L (ref 135–145)
TCO2: 20 mmol/L — ABNORMAL LOW (ref 22–32)

## 2022-05-17 LAB — DIFFERENTIAL
Abs Immature Granulocytes: 0.04 10*3/uL (ref 0.00–0.07)
Basophils Absolute: 0 10*3/uL (ref 0.0–0.1)
Basophils Relative: 0 %
Eosinophils Absolute: 0.2 10*3/uL (ref 0.0–0.5)
Eosinophils Relative: 3 %
Immature Granulocytes: 1 %
Lymphocytes Relative: 6 %
Lymphs Abs: 0.4 10*3/uL — ABNORMAL LOW (ref 0.7–4.0)
Monocytes Absolute: 0.4 10*3/uL (ref 0.1–1.0)
Monocytes Relative: 6 %
Neutro Abs: 6 10*3/uL (ref 1.7–7.7)
Neutrophils Relative %: 84 %

## 2022-05-17 LAB — PROTIME-INR
INR: 1 (ref 0.8–1.2)
Prothrombin Time: 13 seconds (ref 11.4–15.2)

## 2022-05-17 LAB — COMPREHENSIVE METABOLIC PANEL
ALT: 18 U/L (ref 0–44)
AST: 36 U/L (ref 15–41)
Albumin: 3.8 g/dL (ref 3.5–5.0)
Alkaline Phosphatase: 59 U/L (ref 38–126)
Anion gap: 9 (ref 5–15)
BUN: 6 mg/dL — ABNORMAL LOW (ref 8–23)
CO2: 20 mmol/L — ABNORMAL LOW (ref 22–32)
Calcium: 8.9 mg/dL (ref 8.9–10.3)
Chloride: 106 mmol/L (ref 98–111)
Creatinine, Ser: 0.77 mg/dL (ref 0.44–1.00)
GFR, Estimated: >60 mL/min (ref >60)
Glucose, Bld: 151 mg/dL — ABNORMAL HIGH (ref 70–99)
Potassium: 3.5 mmol/L (ref 3.5–5.1)
Sodium: 135 mmol/L (ref 135–145)
Total Bilirubin: 0.7 mg/dL (ref 0.3–1.2)
Total Protein: 6.8 g/dL (ref 6.5–8.1)

## 2022-05-17 LAB — RESP PANEL BY RT-PCR (FLU A&B, COVID) ARPGX2
Influenza A by PCR: NEGATIVE
Influenza B by PCR: NEGATIVE
SARS Coronavirus 2 by RT PCR: NEGATIVE

## 2022-05-17 LAB — CBC
HCT: 39 % (ref 36.0–46.0)
Hemoglobin: 12.4 g/dL (ref 12.0–15.0)
MCH: 32.7 pg (ref 26.0–34.0)
MCHC: 31.8 g/dL (ref 30.0–36.0)
MCV: 102.9 fL — ABNORMAL HIGH (ref 80.0–100.0)
Platelets: 171 10*3/uL (ref 150–400)
RBC: 3.79 MIL/uL — ABNORMAL LOW (ref 3.87–5.11)
RDW: 13.5 % (ref 11.5–15.5)
WBC: 7.1 10*3/uL (ref 4.0–10.5)
nRBC: 0 % (ref 0.0–0.2)

## 2022-05-17 LAB — APTT: aPTT: 22 seconds — ABNORMAL LOW (ref 24–36)

## 2022-05-17 LAB — CK: Total CK: 83 U/L (ref 38–234)

## 2022-05-17 LAB — ETHANOL: Alcohol, Ethyl (B): <10 mg/dL (ref <10)

## 2022-05-17 LAB — TROPONIN I (HIGH SENSITIVITY): Troponin I (High Sensitivity): 11 ng/L (ref <18)

## 2022-05-17 MED ORDER — LACTATED RINGERS IV BOLUS
1000.0000 mL | Freq: Once | INTRAVENOUS | Status: AC
  Administered 2022-05-17: 1000 mL via INTRAVENOUS

## 2022-05-17 MED ORDER — FLUOXETINE HCL 20 MG PO CAPS
20.0000 mg | ORAL_CAPSULE | Freq: Every day | ORAL | Status: DC
  Administered 2022-05-20 – 2022-05-24 (×5): 20 mg via ORAL
  Filled 2022-05-17 (×5): qty 1

## 2022-05-17 MED ORDER — ADULT MULTIVITAMIN W/MINERALS CH
1.0000 | ORAL_TABLET | Freq: Every day | ORAL | Status: DC
  Administered 2022-05-20 – 2022-05-24 (×5): 1 via ORAL
  Filled 2022-05-17 (×5): qty 1

## 2022-05-17 MED ORDER — ORAL CARE MOUTH RINSE: 15.0000 mL | OROMUCOSAL | Status: DC | PRN

## 2022-05-17 MED ORDER — LORAZEPAM 1 MG PO TABS: 1.0000 mg | ORAL_TABLET | ORAL | Status: AC | PRN

## 2022-05-17 MED ORDER — FOLIC ACID 1 MG PO TABS: 1.0000 mg | ORAL_TABLET | Freq: Every day | ORAL | Status: DC

## 2022-05-17 MED ORDER — LORAZEPAM 2 MG/ML IJ SOLN
4.0000 mg | INTRAMUSCULAR | Status: DC | PRN
  Administered 2022-05-17: 4 mg via INTRAVENOUS
  Filled 2022-05-17: qty 2

## 2022-05-17 MED ORDER — PHENYTOIN SODIUM 50 MG/ML IJ SOLN
100.0000 mg | Freq: Three times a day (TID) | INTRAMUSCULAR | Status: DC
  Administered 2022-05-18 – 2022-05-24 (×20): 100 mg via INTRAVENOUS
  Filled 2022-05-17 (×22): qty 2

## 2022-05-17 MED ORDER — SODIUM CHLORIDE 0.9 % IV SOLN
75.0000 mL/h | INTRAVENOUS | Status: DC
  Administered 2022-05-18 – 2022-05-20 (×6): 75 mL/h via INTRAVENOUS

## 2022-05-17 MED ORDER — GABAPENTIN 100 MG PO CAPS
100.0000 mg | ORAL_CAPSULE | Freq: Three times a day (TID) | ORAL | Status: DC
  Administered 2022-05-20 – 2022-05-24 (×13): 100 mg via ORAL
  Filled 2022-05-17 (×13): qty 1

## 2022-05-17 MED ORDER — LEVETIRACETAM IN NACL 1000 MG/100ML IV SOLN
1000.0000 mg | Freq: Two times a day (BID) | INTRAVENOUS | Status: DC
Start: 2022-05-18 — End: 2022-05-24
  Administered 2022-05-18 – 2022-05-24 (×13): 1000 mg via INTRAVENOUS
  Filled 2022-05-17 (×13): qty 100

## 2022-05-17 MED ORDER — LEVETIRACETAM IN NACL 500 MG/100ML IV SOLN
500.0000 mg | Freq: Once | INTRAVENOUS | Status: AC
  Administered 2022-05-17: 500 mg via INTRAVENOUS
  Filled 2022-05-17: qty 100

## 2022-05-17 MED ORDER — SODIUM CHLORIDE 0.9 % IV SOLN
2000.0000 mg | Freq: Once | INTRAVENOUS | Status: DC
  Filled 2022-05-17: qty 20

## 2022-05-17 MED ORDER — ENOXAPARIN SODIUM 40 MG/0.4ML IJ SOSY
40.0000 mg | PREFILLED_SYRINGE | INTRAMUSCULAR | Status: DC
Start: 2022-05-17 — End: 2022-05-24
  Administered 2022-05-18 – 2022-05-23 (×7): 40 mg via SUBCUTANEOUS
  Filled 2022-05-17 (×7): qty 0.4

## 2022-05-17 MED ORDER — LORAZEPAM 2 MG/ML IJ SOLN
1.0000 mg | INTRAMUSCULAR | Status: AC | PRN
  Filled 2022-05-17: qty 1

## 2022-05-17 MED ORDER — LEVETIRACETAM IN NACL 500 MG/100ML IV SOLN: 500.0000 mg | Freq: Two times a day (BID) | INTRAVENOUS | Status: DC

## 2022-05-17 MED ORDER — THIAMINE HCL 100 MG PO TABS
100.0000 mg | ORAL_TABLET | Freq: Every day | ORAL | Status: DC
  Administered 2022-05-20 – 2022-05-24 (×4): 100 mg via ORAL
  Filled 2022-05-17 (×4): qty 1

## 2022-05-17 MED ORDER — THIAMINE HCL 100 MG/ML IJ SOLN
100.0000 mg | Freq: Every day | INTRAMUSCULAR | Status: DC
  Administered 2022-05-18 – 2022-05-21 (×3): 100 mg via INTRAVENOUS
  Filled 2022-05-17 (×3): qty 2

## 2022-05-17 MED ORDER — LORAZEPAM 2 MG/ML IJ SOLN
2.0000 mg | Freq: Once | INTRAMUSCULAR | Status: AC
Start: 2022-05-17 — End: 2022-05-17
  Administered 2022-05-17: 2 mg via INTRAVENOUS
  Filled 2022-05-17: qty 1

## 2022-05-17 MED ORDER — SODIUM CHLORIDE 0.9 % IV SOLN
1225.0000 mg | Freq: Once | INTRAVENOUS | Status: AC
  Administered 2022-05-17: 1225 mg via INTRAVENOUS
  Filled 2022-05-17: qty 24.5

## 2022-05-17 MED ORDER — LEVETIRACETAM IN NACL 1500 MG/100ML IV SOLN
1500.0000 mg | Freq: Once | INTRAVENOUS | Status: AC
  Administered 2022-05-17: 1500 mg via INTRAVENOUS
  Filled 2022-05-17: qty 100

## 2022-05-17 MED ORDER — ONDANSETRON HCL 4 MG/2ML IJ SOLN
4.0000 mg | Freq: Once | INTRAMUSCULAR | Status: AC
  Administered 2022-05-17: 4 mg via INTRAVENOUS
  Filled 2022-05-17: qty 2

## 2022-05-17 MED ORDER — ORAL CARE MOUTH RINSE
15.0000 mL | OROMUCOSAL | Status: DC
  Administered 2022-05-17: 15 mL via OROMUCOSAL

## 2022-05-17 NOTE — Procedures (Signed)
Patient Name: [name]  MRN: [id number]  Epilepsy Attending: Charlsie Quest  Referring Physician/Provider: Rejeana Brock, MD  Date: 05/17/2022 Duration: 22.05 mins  Patient history:  66 year old female who presented in status epilepticus. EEG to evaluate for seizure  Level of alertness:  lethargic   AEDs during EEG study: LEV,   Technical aspects: This EEG study was done with scalp electrodes positioned according to the 10-20 International system of electrode placement. Electrical activity was reviewed with band pass filter of 1-70Hz , sensitivity of 7 uV/mm, display speed of 60mm/sec with a 60Hz  notched filter applied as appropriate. EEG data were recorded continuously and digitally stored.  Video monitoring was available and reviewed as appropriate.  Description: No posterior dominant rhythm was seen. EEG showed continuous 2-3Hz  delta slowing admixed with an excessive amount of 15 to 18 Hz beta activity in left hemisphere. There is sharply contoured 2-3hz  delta slowing admixed with 15-18hz  beta activity in right hemisphere, maximal right temporal region which at times appears rhythmic with evolution in frequency and morphology.No clinical changes are noted during this EEG change.  Hyperventilation and photic stimulation were not performed.     ABNORMALITY - Lateralized rhythmic delta slowing, right hemisphere, maximal right temporal region - Continuous slow, left hemisphere  IMPRESSION: This study is suggestive of cortical dysfunction arising from right hemisphere, maximal temporal region which is on the ictal-interictal continuum. Please obtain long term monitoring for further characterization.   Additionally there is moderate to severe diffuse encephalopathy, nonspecific etiology.   [name] 

## 2022-05-17 NOTE — ED Notes (Signed)
Dr Antionette Char called back.  Wants loading Cerebryx started before going upstairs.

## 2022-05-17 NOTE — Progress Notes (Signed)
LTM EEG hooked up and running - no initial skin breakdown - push button tested - neuro notified. [facility] monitoring.  

## 2022-05-17 NOTE — ED Triage Notes (Signed)
Pt BIB [facility] from home d/t seizures.  Boyfriend, came back, found the pt on the floor, unresponsive, usually the seizure is  lcohol induced. Pt had Unknown numbers of seizures for an hour before the boyfriend called the EMS. Pt is  a heavy drinker.   5mg  versed IM given by EMS.  Cbg 120 Hr 128  100% RA 161/96

## 2022-05-17 NOTE — Progress Notes (Addendum)
pattern in the right hemisphere that is evolving in morphology and amplitude, but not definitely frequency, would favor loading with fosphenytoin 20 pe/kg  Ritta Slot, MD [facility] [phone]  If 7pm- 7am, please page neurology on call as listed in [facility]ION.

## 2022-05-17 NOTE — H&P (Signed)
History and Physical    [name] XBM:[id number] DOB: Nov 23, 1955 DOA: 05/17/2022  PCP: [facility] (Confirm with patient/family/NH records and if not entered, this has to be entered at [facility] point of entry) Patient coming from: Home  I have personally briefly reviewed patient's old medical records in [facility]  Chief Complaint: Patient non-verbal  HPI: [name] is a 66 y.o. female with medical history significant of alcohol abuse, alcohol withdrawal seizure, anxiety/depression, sent from home for evaluation of recurrent seizures.  Patient nonverbal and probably confused at this point postictal, I tried to reach patient significant other and another contact number but both contacts hung up the phone immediately after picking up and never picked up again. So all obtained by review ED record and talking to ED staff.  It appears that patient was found around the evening of this morning by her boyfriend on the floor with seizure-like activity whole body shaking.  EMS alcohol and patient had another episode of seizure in ambulance and was given Ativan and broke.  Patient mentation however did not recover after seizure broke.  ED Course: Afebrile, no Tachycardia no hypotension.  CT head and neck negative for acute findings.  Review of Systems: Unable to perform, patient nonverbal at this point.  Past Medical History:  Diagnosis Date   Alcohol dependence (HCC)    Anxiety    Chronic pain    Depression    Diverticulitis    Herniated cervical disc    Pancreatitis    Seizures (HCC)    alcoholic seizures    Past Surgical History:  Procedure Laterality Date   ABDOMINAL HYSTERECTOMY     ANKLE SURGERY Right    CARPAL TUNNEL RELEASE Bilateral    CERVICAL FUSION     CHOLECYSTECTOMY     KNEE SURGERY       reports that she has been smoking cigarettes. She has a 1.75 pack-year smoking history. She has never used smokeless tobacco. She reports current  alcohol use. She reports that she does not currently use drugs after having used the following drugs: Marijuana.  Allergies  Allergen Reactions   Keflex [Cephalexin] Hives   Cephalosporins Hives   Prednisone Swelling   Toradol [Ketorolac Tromethamine] Hives   Ultram [Tramadol] Hives    No family history on file.   Prior to Admission medications   Medication Sig Start Date End Date Taking? Authorizing Provider  FLUoxetine (PROZAC) 20 MG capsule Take 1 capsule (20 mg total) by mouth daily. 04/07/22 05/07/22  Doran Stabler, DO  gabapentin (NEURONTIN) 100 MG capsule Take 1 capsule (100 mg total) by mouth 3 (three) times daily. 04/07/22 05/07/22  Doran Stabler, DO  levETIRAcetam (KEPPRA) 500 MG tablet Take 1 tablet (500 mg total) by mouth 2 (two) times daily. 04/07/22 05/07/22  Doran Stabler, DO  DULoxetine (CYMBALTA) 30 MG capsule Take 1 capsule (30 mg total) by mouth daily. Patient not taking: Reported on 09/01/2020 06/11/20 09/02/20  Money, [name], FNP  metoCLOPramide (REGLAN) 10 MG tablet Take 1 tablet (10 mg total) by mouth every 6 (six) hours as needed for nausea (nausea/headache). 04/06/19 05/24/19  Marily Memos, MD    Physical Exam: Vitals:   05/17/22 1647 05/17/22 1700 05/17/22 1817 05/17/22 1830  BP: (!) 145/86 (!) 142/87 134/79 139/83  Pulse: 87 86 77 73  Resp: 18 17 16 16   Temp: 97.8 F (36.6 C)  (!) 97.5 F (36.4 C)   TempSrc: Skin  Oral   SpO2: 100% 98%  100% 100%    Constitutional: NAD, calm, comfortable Vitals:   05/17/22 1647 05/17/22 1700 05/17/22 1817 05/17/22 1830  BP: (!) 145/86 (!) 142/87 134/79 139/83  Pulse: 87 86 77 73  Resp: 18 17 16 16   Temp: 97.8 F (36.6 C)  (!) 97.5 F (36.4 C)   TempSrc: Skin  Oral   SpO2: 100% 98% 100% 100%   Eyes: PERRL, lids and conjunctivae normal ENMT: Mucous membranes are moist. Posterior pharynx clear of any exudate or lesions.Normal dentition.  Neck: normal, supple, no masses, no thyromegaly Respiratory: clear to auscultation  bilaterally, no wheezing, no crackles. Normal respiratory effort. No accessory muscle use.  Cardiovascular: Regular rate and rhythm, no murmurs / rubs / gallops. No extremity edema. 2+ pedal pulses. No carotid bruits.  Abdomen: no tenderness, no masses palpated. No hepatosplenomegaly. Bowel sounds positive.  Musculoskeletal: no clubbing / cyanosis. No joint deformity upper and lower extremities. Good ROM, no contractures. Normal muscle tone.  Skin: no rashes, lesions, ulcers. No induration Neurologic: No facial droops, moving all limbs, following some commands. Psychiatric: Awake, responding to verbal stimuli, confused and nonverbal    Labs on Admission: I have personally reviewed following labs and imaging studies  CBC: Recent Labs  Lab 05/17/22 1538 05/17/22 1542  WBC 7.1  --   NEUTROABS 6.0  --   HGB 12.4 13.9  HCT 39.0 41.0  MCV 102.9*  --   PLT 171  --    Basic Metabolic Panel: Recent Labs  Lab 05/17/22 1538 05/17/22 1542  NA 135 136  K 3.5 4.4  CL 106 104  CO2 20*  --   GLUCOSE 151* 158*  BUN 6* 6*  CREATININE 0.77 0.60  CALCIUM 8.9  --    GFR: CrCl cannot be calculated (Unknown ideal weight.). Liver Function Tests: Recent Labs  Lab 05/17/22 1538  AST 36  ALT 18  ALKPHOS 59  BILITOT 0.7  PROT 6.8  ALBUMIN 3.8   No results for input(s): "LIPASE", "AMYLASE" in the last 168 hours. No results for input(s): "AMMONIA" in the last 168 hours. Coagulation Profile: Recent Labs  Lab 05/17/22 1538  INR 1.0   Cardiac Enzymes: No results for input(s): "CKTOTAL", "CKMB", "CKMBINDEX", "TROPONINI" in the last 168 hours. BNP (last 3 results) No results for input(s): "PROBNP" in the last 8760 hours. HbA1C: No results for input(s): "HGBA1C" in the last 72 hours. CBG: No results for input(s): "GLUCAP" in the last 168 hours. Lipid Profile: No results for input(s): "CHOL", "HDL", "LDLCALC", "TRIG", "CHOLHDL", "LDLDIRECT" in the last 72 hours. Thyroid Function  Tests: No results for input(s): "TSH", "T4TOTAL", "FREET4", "T3FREE", "THYROIDAB" in the last 72 hours. Anemia Panel: No results for input(s): "VITAMINB12", "FOLATE", "FERRITIN", "TIBC", "IRON", "RETICCTPCT" in the last 72 hours. Urine analysis:    Component Value Date/Time   COLORURINE YELLOW 04/02/2022 1753   APPEARANCEUR CLEAR 04/02/2022 1753   LABSPEC 1.011 04/02/2022 1753   PHURINE 6.0 04/02/2022 1753   GLUCOSEU NEGATIVE 04/02/2022 1753   HGBUR NEGATIVE 04/02/2022 1753   BILIRUBINUR NEGATIVE 04/02/2022 1753   KETONESUR 5 (A) 04/02/2022 1753   PROTEINUR NEGATIVE 04/02/2022 1753   UROBILINOGEN 0.2 07/14/2019 1451   NITRITE NEGATIVE 04/02/2022 1753   LEUKOCYTESUR NEGATIVE 04/02/2022 1753    Radiological Exams on Admission: CT CERVICAL SPINE WO CONTRAST  Result Date: 05/17/2022 CLINICAL DATA:  Neck trauma (Age >= 65y) EXAM: CT CERVICAL SPINE WITHOUT CONTRAST TECHNIQUE: Multidetector CT imaging of the cervical spine was performed without intravenous contrast. Multiplanar CT  image reconstructions were also generated. RADIATION DOSE REDUCTION: This exam was performed according to the departmental dose-optimization program which includes automated exposure control, adjustment of the mA and/or kV according to patient size and/or use of iterative reconstruction technique. COMPARISON:  12/11/2018 FINDINGS: Alignment: Facet joints are aligned without dislocation or traumatic listhesis. Dens and lateral masses are aligned. Skull base and vertebrae: Prior C4-C7 ACDF. No evidence of acute fracture. Soft tissues and spinal canal: No prevertebral fluid or swelling. No visible canal hematoma. Disc levels:  Similar degree of multilevel spondylosis. Upper chest: Negative. Other: None. IMPRESSION: 1. No acute fracture or traumatic listhesis of the cervical spine. 2. Prior C4-C7 ACDF. Electronically Signed   By: Duanne Guess D.O.   On: 05/17/2022 16:35   CT HEAD CODE STROKE WO CONTRAST  Result Date:  05/17/2022 CLINICAL DATA:  Code stroke.  Seizure. EXAM: CT HEAD WITHOUT CONTRAST TECHNIQUE: Contiguous axial images were obtained from the base of the skull through the vertex without intravenous contrast. RADIATION DOSE REDUCTION: This exam was performed according to the departmental dose-optimization program which includes automated exposure control, adjustment of the mA and/or kV according to patient size and/or use of iterative reconstruction technique. COMPARISON:  CT head without contrast 04/02/2022. MR head without contrast 03/06/2022 FINDINGS: Brain: Mild periventricular and subcortical white matter hypoattenuation is again seen bilaterally. No acute infarct, hemorrhage, or mass lesion is present. Basal ganglia are intact. Insular ribbon is normal bilaterally. No acute or focal cortical abnormality is present. Temporal lobes are unremarkable. The ventricles are of normal size. No significant extraaxial fluid collection is present. The craniocervical junction is normal. Upper cervical spine is within normal limits. Marrow signal is unremarkable. Vascular: No hyperdense vessel or unexpected calcification. Skull: Calvarium is intact. No focal lytic or blastic lesions are present. Soft tissue swelling is present over the occipital scalp. This may reflect trauma. No underlying fracture is present. No foreign body is present. Sinuses/Orbits: The paranasal sinuses and mastoid air cells are clear. The globes and orbits are within normal limits. ASPECTS [facility] Stroke Program Early CT Score) - Ganglionic level infarction (caudate, lentiform nuclei, internal capsule, insula, M1-M3 cortex): 7/7 - Supraganglionic infarction (M4-M6 cortex): 3/3 Total score (0-10 with 10 being normal): 10/10 IMPRESSION: 1. Soft tissue swelling over the occipital scalp without underlying fracture. This may reflect trauma. 2. No acute intracranial abnormality or significant interval change. 3. Stable mild white matter disease. This likely  reflects the sequela of chronic microvascular ischemia. 4. Aspects 10/10 * The above was relayed via text pager to Dr. Amada Jupiter on 05/17/2022 at 15:52 . Electronically Signed   By: Marin Roberts M.D.   On: 05/17/2022 15:53    EKG: Independently reviewed. Sinus tachy  Assessment/Plan Active Problems:   Alcohol use disorder, severe, dependence (HCC)   Seizure (HCC)  (please populate well all problems here in Problem List. (For example, if patient is on BP meds at home and you resume or decide to hold them, it is a problem that needs to be her. Same for CAD, COPD, HLD and so on)  Alcohol withdrawal seizure, recurrent -Seizure precaution -Completed Keppra loading, unsure about whether patient is compliant with Keppra -EEG is being done.  CT head and neck negative for acute findings. -Currently, has trouble to which patient family and any contact, so unable to confirm patient most recent drinking history.  Acute metabolic encephalopathy -Likely postictal from seizure x2 -Reevaluate in AM. -Appears that patient underwent extensive stroke work-up on last admission -UDS ordered  Alcohol abuse -CIWA protocol, as needed Ativan  Chronic back pain -Continue gabapentin, less likely causing her withdrawal seizure. Need to follow once patient mentation improves.  DVT prophylaxis: Lovenox Code Status: Full code Family Communication: None at bedside Disposition Plan: Patient sick with recurrent withdrawal seizure, expect concurrent alcohol withdrawal as well, expect more than 2 midnight hospital stay Consults called: Neurology Admission status: PCU   Emeline General MD [facility] Pager [phone]  05/17/2022, 6:42 PM

## 2022-05-17 NOTE — ED Provider Notes (Signed)
[facility] EMERGENCY DEPARTMENT Provider Note   CSN: [id number] Arrival date & time: 05/17/22  1515     History  Chief Complaint  Patient presents with   Seizures    [name] is a 66 y.o. female with history of alcohol abuse, seizure disorder presenting via EMS for seizure.  Patient was reportedly in the kitchen around 1330 when she had an unwitnessed fall per boyfriend, had approximately 5 seizures over the course of an hour and 12 boyfriend called EMS at around 230.  EMS came, on route patient had additional seizure with full body shaking activity, they gave 5 mg of Versed with improvement.  Patient otherwise with reassuring vital signs, no hypoxia, breathing spontaneously on route.  History otherwise limited as patient is unable to provide history due to condition, ongoing status epilepticus.   Seizures      Home Medications Prior to Admission medications   Medication Sig Start Date End Date Taking? Authorizing Provider  FLUoxetine (PROZAC) 20 MG capsule Take 1 capsule (20 mg total) by mouth daily. 04/07/22 05/07/22  Doran Stabler, DO  gabapentin (NEURONTIN) 100 MG capsule Take 1 capsule (100 mg total) by mouth 3 (three) times daily. 04/07/22 05/07/22  Doran Stabler, DO  levETIRAcetam (KEPPRA) 500 MG tablet Take 1 tablet (500 mg total) by mouth 2 (two) times daily. 04/07/22 05/07/22  Doran Stabler, DO  DULoxetine (CYMBALTA) 30 MG capsule Take 1 capsule (30 mg total) by mouth daily. Patient not taking: Reported on 09/01/2020 06/11/20 09/02/20  Money, [name], FNP  metoCLOPramide (REGLAN) 10 MG tablet Take 1 tablet (10 mg total) by mouth every 6 (six) hours as needed for nausea (nausea/headache). 04/06/19 05/24/19  Mesner, Barbara Cower, MD      Allergies    Keflex [cephalexin], Cephalosporins, Prednisone, Toradol [ketorolac tromethamine], and Ultram [tramadol]    Review of Systems   Review of Systems  Neurological:  Positive for seizures.  Unable to obtain due to  altered mental status  Physical Exam Updated Vital Signs BP 101/75 (BP Location: Left Arm)   Pulse 66   Temp 98.8 F (37.1 C) (Oral)   Resp 13   SpO2 97%  Physical Exam Constitutional:      General: She is in acute distress.     Comments: Not alert, looking around room  HENT:     Head: Normocephalic and atraumatic.     Right Ear: External ear normal.     Left Ear: External ear normal.     Mouth/Throat:     Mouth: Mucous membranes are moist.     Comments: Protecting airway Eyes:     Pupils: Pupils are equal, round, and reactive to light.     Comments: Leftward gaze deviation  Neck:     Comments: C-collar in place Cardiovascular:     Rate and Rhythm: Regular rhythm. Tachycardia present.  Pulmonary:     Effort: Pulmonary effort is normal. No respiratory distress.  Abdominal:     General: Abdomen is flat.     Palpations: Abdomen is soft.     Tenderness: There is no abdominal tenderness.  Musculoskeletal:     Right lower leg: No edema.     Left lower leg: No edema.  Skin:    General: Skin is warm and dry.     Capillary Refill: Capillary refill takes less than 2 seconds.  Neurological:     Comments: Withdraws from pain on the left, not following commands, does not withdraw from pain on the right  upper and right lower extremity.  Opens eyes spontaneously, no speech,  Psychiatric:        Mood and Affect: Mood normal.        Behavior: Behavior normal.     ED Results / Procedures / Treatments   Labs (all labs ordered are listed, but only abnormal results are displayed) Labs Reviewed  APTT - Abnormal; Notable for the following components:      Result Value   aPTT 22 (*)    All other components within normal limits  CBC - Abnormal; Notable for the following components:   RBC 3.79 (*)    MCV 102.9 (*)    All other components within normal limits  DIFFERENTIAL - Abnormal; Notable for the following components:   Lymphs Abs 0.4 (*)    All other components within normal  limits  COMPREHENSIVE METABOLIC PANEL - Abnormal; Notable for the following components:   CO2 20 (*)    Glucose, Bld 151 (*)    BUN 6 (*)    All other components within normal limits  I-STAT CHEM 8, ED - Abnormal; Notable for the following components:   BUN 6 (*)    Glucose, Bld 158 (*)    Calcium, Ion 1.11 (*)    TCO2 20 (*)    All other components within normal limits  RESP PANEL BY RT-PCR (FLU A&B, COVID) ARPGX2  ETHANOL  PROTIME-INR  CK  RAPID URINE DRUG SCREEN, HOSP PERFORMED  URINALYSIS, ROUTINE W REFLEX MICROSCOPIC  RAPID URINE DRUG SCREEN, HOSP PERFORMED  TROPONIN I (HIGH SENSITIVITY)    EKG EKG Interpretation  Date/Time:  Sunday May 17 2022 15:23:55 EDT Ventricular Rate:  116 PR Interval:  170 QRS Duration: 90 QT Interval:  353 QTC Calculation: 491 R Axis:   -65 Text Interpretation: Sinus tachycardia Left anterior fascicular block Abnormal R-wave progression, late transition Borderline prolonged QT interval Confirmed by Alvino BloodScheving, Aybree Lanyon ([id number]) on 05/17/2022 3:26:33 PM  Radiology EEG adult  Result Date: 05/17/2022 Charlsie QuestYadav, Priyanka O, MD     05/17/2022  8:10 PM Patient Name: Emily FlakeDarlene Murrell MRN: [id number] Epilepsy Attending: Charlsie QuestPriyanka O Yadav Referring Physician/Provider: Rejeana BrockKirkpatrick, McNeill P, MD Date: 05/17/2022 Duration: 22.05 mins Patient history:  66 year old female who presented in status epilepticus. EEG to evaluate for seizure Level of alertness:  lethargic AEDs during EEG study: LEV, Technical aspects: This EEG study was done with scalp electrodes positioned according to the 10-20 International system of electrode placement. Electrical activity was reviewed with band pass filter of 1-70Hz , sensitivity of 7 uV/mm, display speed of 3330mm/sec with a 60Hz  notched filter applied as appropriate. EEG data were recorded continuously and digitally stored.  Video monitoring was available and reviewed as appropriate. Description: No posterior dominant rhythm was seen. EEG  showed continuous 2-3Hz  delta slowing admixed with an excessive amount of 15 to 18 Hz beta activity in left hemisphere. There is sharply contoured 2-3hz  delta slowing admixed with 15-18hz  beta activity in right hemisphere, maximal right temporal region which at times appears rhythmic with evolution in frequency and morphology.No clinical changes are noted during this EEG change.  Hyperventilation and photic stimulation were not performed.   ABNORMALITY - Lateralized rhythmic delta slowing, right hemisphere, maximal right temporal region - Continuous slow, left hemisphere IMPRESSION: This study is suggestive of cortical dysfunction arising from right hemisphere, maximal temporal region which is on the ictal-interictal continuum. Please obtain long term monitoring for further characterization. Additionally there is moderate to severe diffuse encephalopathy, nonspecific etiology. Priyanka Annabelle Harman Yadav  CT CERVICAL SPINE WO CONTRAST  Result Date: 05/17/2022 CLINICAL DATA:  Neck trauma (Age >= 65y) EXAM: CT CERVICAL SPINE WITHOUT CONTRAST TECHNIQUE: Multidetector CT imaging of the cervical spine was performed without intravenous contrast. Multiplanar CT image reconstructions were also generated. RADIATION DOSE REDUCTION: This exam was performed according to the departmental dose-optimization program which includes automated exposure control, adjustment of the mA and/or kV according to patient size and/or use of iterative reconstruction technique. COMPARISON:  12/11/2018 FINDINGS: Alignment: Facet joints are aligned without dislocation or traumatic listhesis. Dens and lateral masses are aligned. Skull base and vertebrae: Prior C4-C7 ACDF. No evidence of acute fracture. Soft tissues and spinal canal: No prevertebral fluid or swelling. No visible canal hematoma. Disc levels:  Similar degree of multilevel spondylosis. Upper chest: Negative. Other: None. IMPRESSION: 1. No acute fracture or traumatic listhesis of the cervical  spine. 2. Prior C4-C7 ACDF. Electronically Signed   By: Duanne Guess D.O.   On: 05/17/2022 16:35   CT HEAD CODE STROKE WO CONTRAST  Result Date: 05/17/2022 CLINICAL DATA:  Code stroke.  Seizure. EXAM: CT HEAD WITHOUT CONTRAST TECHNIQUE: Contiguous axial images were obtained from the base of the skull through the vertex without intravenous contrast. RADIATION DOSE REDUCTION: This exam was performed according to the departmental dose-optimization program which includes automated exposure control, adjustment of the mA and/or kV according to patient size and/or use of iterative reconstruction technique. COMPARISON:  CT head without contrast 04/02/2022. MR head without contrast 03/06/2022 FINDINGS: Brain: Mild periventricular and subcortical white matter hypoattenuation is again seen bilaterally. No acute infarct, hemorrhage, or mass lesion is present. Basal ganglia are intact. Insular ribbon is normal bilaterally. No acute or focal cortical abnormality is present. Temporal lobes are unremarkable. The ventricles are of normal size. No significant extraaxial fluid collection is present. The craniocervical junction is normal. Upper cervical spine is within normal limits. Marrow signal is unremarkable. Vascular: No hyperdense vessel or unexpected calcification. Skull: Calvarium is intact. No focal lytic or blastic lesions are present. Soft tissue swelling is present over the occipital scalp. This may reflect trauma. No underlying fracture is present. No foreign body is present. Sinuses/Orbits: The paranasal sinuses and mastoid air cells are clear. The globes and orbits are within normal limits. ASPECTS [facility] Stroke Program Early CT Score) - Ganglionic level infarction (caudate, lentiform nuclei, internal capsule, insula, M1-M3 cortex): 7/7 - Supraganglionic infarction (M4-M6 cortex): 3/3 Total score (0-10 with 10 being normal): 10/10 IMPRESSION: 1. Soft tissue swelling over the occipital scalp without underlying  fracture. This may reflect trauma. 2. No acute intracranial abnormality or significant interval change. 3. Stable mild white matter disease. This likely reflects the sequela of chronic microvascular ischemia. 4. Aspects 10/10 * The above was relayed via text pager to Dr. Amada Jupiter on 05/17/2022 at 15:52 . Electronically Signed   By: Marin Roberts M.D.   On: 05/17/2022 15:53    Procedures .Critical Care  Performed by: Lonell Grandchild, MD Authorized by: Lonell Grandchild, MD   Critical care provider statement:    Critical care time (minutes):  30   Critical care end time:  05/17/2022 4:06 PM   Critical care time was exclusive of:  Separately billable procedures and treating other patients   Critical care was necessary to treat or prevent imminent or life-threatening deterioration of the following conditions:  CNS failure or compromise and respiratory failure   Critical care was time spent personally by me on the following activities:  Development of treatment plan  with patient or surrogate, discussions with consultants, evaluation of patient's response to treatment, examination of patient, ordering and review of laboratory studies, ordering and review of radiographic studies, ordering and performing treatments and interventions, pulse oximetry, re-evaluation of patient's condition and review of old charts     Medications Ordered in ED Medications  LORazepam (ATIVAN) tablet 1-4 mg (has no administration in time range)    Or  LORazepam (ATIVAN) injection 1-4 mg (has no administration in time range)  thiamine (VITAMIN B1) tablet 100 mg (has no administration in time range)    Or  thiamine (VITAMIN B1) injection 100 mg (has no administration in time range)  folic acid (FOLVITE) tablet 1 mg (1 mg Oral Not Given 05/17/22 2120)  multivitamin with minerals tablet 1 tablet (1 tablet Oral Not Given 05/17/22 2120)  FLUoxetine (PROZAC) capsule 20 mg (has no administration in time range)   gabapentin (NEURONTIN) capsule 100 mg (100 mg Oral Not Given 05/17/22 2313)  Oral care mouth rinse (has no administration in time range)  0.9 %  sodium chloride infusion (75 mL/hr Intravenous New Bag/Given 05/18/22 0013)  enoxaparin (LOVENOX) injection 40 mg (40 mg Subcutaneous Given 05/18/22 0012)  LORazepam (ATIVAN) injection 4 mg (4 mg Intravenous Given 05/17/22 2056)  levETIRAcetam (KEPPRA) IVPB 1000 mg/100 mL premix (has no administration in time range)  phenytoin (DILANTIN) injection 100 mg (has no administration in time range)  LORazepam (ATIVAN) injection 2 mg (2 mg Intravenous Given 05/17/22 2039)  lactated ringers bolus 1,000 mL (1,000 mLs Intravenous New Bag/Given 05/17/22 1639)  levETIRAcetam (KEPPRA) IVPB 1500 mg/ 100 mL premix (0 mg Intravenous Stopped 05/17/22 1655)    And  levETIRAcetam (KEPPRA) IVPB 500 mg/100 mL premix (0 mg Intravenous Stopped 05/17/22 1614)  ondansetron (ZOFRAN) injection 4 mg (4 mg Intravenous Given 05/17/22 1739)  fosPHENYtoin (CEREBYX) 1,225 mg PE in sodium chloride 0.9 % 50 mL IVPB (0 mg PE Intravenous Stopped 05/17/22 2142)    ED Course/ Medical Decision Making/ A&P Clinical Course as of 05/18/22 0109  Sun May 17, 2022  1753 Discussed with Dr. Amada Jupiter EEG without oncgoing seizures concerning for status epilepticus. OK for floor admission.  [WS]    Clinical Course User Index [WS] Lonell Grandchild, MD                           Medical Decision Making Amount and/or Complexity of Data Reviewed Labs: ordered. Radiology: ordered.  Risk Prescription drug management. Decision regarding hospitalization.   66 year old female with history of seizure disorder, alcohol abuse presenting to the emergency department for seizure.  On exam, patient had right-sided paralysis with leftward gaze deviation.  On arrival, was not back at baseline but did not have any focal rhythmic seizure activity.  Given focal neurologic deficit, code stroke was called,,  patient was evaluated by neurology who feels that stroke is less likely and symptoms are most consistent with a Todd's paralysis in the setting of her prior seizure disorder.  Patient given Versed via EMS and Ativan in the ER.  Will receive Keppra.  Unclear if patient taking home Keppra.  Will closely monitor for improvement in seizure activity.  EEG will be ordered by neurology.  Disposition pending improvement.         Final Clinical Impression(s) / ED Diagnoses Final diagnoses:  Seizure (HCC)  Seizure disorder (HCC)  Altered mental status, unspecified altered mental status type    Rx / DC Orders ED Discharge Orders  None         Lonell Grandchild, MD 05/18/22 [phone]

## 2022-05-17 NOTE — ED Notes (Signed)
Patient with rapid blinking and deviation of gaze to the left.  Body was rigid and right hand held out.  Floor coverage paged and EDP notified.

## 2022-05-17 NOTE — Progress Notes (Signed)
EEG complete - results pending 

## 2022-05-17 NOTE — Consult Note (Signed)
Neurology Consultation Reason for Consult: Seizures Referring Physician: Alvino Blood  CC: Seizures  History is obtained from: Chart review  HPI: [name] is a 66 y.o. female with a history of epilepsy as well as a tendency to have her epilepsy breakthrough when she goes in alcohol withdrawal who presents with seizures.  She was supposedly was drinking yesterday, maybe slightly less today.  She then was seen to have seizure activity, and was reportedly seizing for an hour or so prior to EMS being called.  EMS gave 5 mg of Versed en route with cessation of clonic activity.  On my initial evaluation, she still had beating nystagmus with leftward gaze and right-sided weakness.  Over the course of my evaluation her right-sided weakness markedly improved, but she never began speaking.  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  LKW: Unclear time of onset tpa given?: no, unclear time of onset Past Medical History:  Diagnosis Date   Alcohol dependence (HCC)    Anxiety    Chronic pain    Depression    Diverticulitis    Herniated cervical disc    Pancreatitis    Seizures (HCC)    alcoholic seizures     No family history on file.   Social History:  reports that she has been smoking cigarettes. She has a 1.75 pack-year smoking history. She has never used smokeless tobacco. She reports current alcohol use. She reports that she does not currently use drugs after having used the following drugs: Marijuana.   Exam: Current vital signs: BP (!) 141/88   Pulse (!) 112   Resp 12   SpO2 97%  Vital signs in last 24 hours: Pulse Rate:  [112] 112 (07/30 1525) Resp:  [12-22] 12 (07/30 1525) BP: (141)/(88) 141/88 (07/30 1524) SpO2:  [97 %] 97 % (07/30 1525)   Physical Exam  Constitutional: Appears well-developed and well-nourished.    Neuro: Mental Status: Patient is awake, alert, she does  not follow commands but does engage the examiner. Cranial Nerves: II: She does not blink to threat from either direction but does fixate. Pupils are equal, round, and reactive to light.   III,IV, VI: She has a left gaze preference, though she does appear to cross midline to the right V: Facial sensation is symmetric to temperature VII: Facial movement is symmetric.  Motor: She initially appeared to have some right-sided weakness, but moves both sides well by the time I am done with my exam Sensory: Response to noxious stimulation in all four extremities Cerebellar: She does not perform     I have reviewed labs in epic and the results pertinent to this consultation are: Creatinine 0.77  I have reviewed the images obtained: CT head-negative  Impression: 66 year old female who presented in status epilepticus with seizures for an unknown period of time prior to admission, but at least approximately an hour.  Unclear if she was compliant with her Keppra.  She is connected to a continuous EEG given that she is not returning to baseline, but I do not see any ongoing seizure activity on it at this time.  Recommendations: 1) LTM EEG overnight 2) continue Keppra 1 g twice daily 3) neurology will follow   Ritta Slot, MD [facility] [phone]  If 7pm- 7am, please page neurology on call as listed in AMION.

## 2022-05-18 DIAGNOSIS — F1020 Alcohol dependence, uncomplicated: Secondary | ICD-10-CM

## 2022-05-18 DIAGNOSIS — R569 Unspecified convulsions: Secondary | ICD-10-CM | POA: Diagnosis not present

## 2022-05-18 DIAGNOSIS — G40901 Epilepsy, unspecified, not intractable, with status epilepticus: Secondary | ICD-10-CM | POA: Diagnosis not present

## 2022-05-18 DIAGNOSIS — R4182 Altered mental status, unspecified: Secondary | ICD-10-CM

## 2022-05-18 DIAGNOSIS — G40909 Epilepsy, unspecified, not intractable, without status epilepticus: Secondary | ICD-10-CM

## 2022-05-18 LAB — URINALYSIS, ROUTINE W REFLEX MICROSCOPIC
Bilirubin Urine: NEGATIVE
Glucose, UA: NEGATIVE mg/dL
Hgb urine dipstick: NEGATIVE
Ketones, ur: NEGATIVE mg/dL
Leukocytes,Ua: NEGATIVE
Nitrite: NEGATIVE
Protein, ur: NEGATIVE mg/dL
Specific Gravity, Urine: 1.011 (ref 1.005–1.030)
pH: 6 (ref 5.0–8.0)

## 2022-05-18 LAB — RAPID URINE DRUG SCREEN, HOSP PERFORMED
Amphetamines: NOT DETECTED
Barbiturates: NOT DETECTED
Benzodiazepines: POSITIVE — AB
Cocaine: NOT DETECTED
Opiates: NOT DETECTED
Tetrahydrocannabinol: NOT DETECTED

## 2022-05-18 LAB — VITAMIN B12: Vitamin B-12: 555 pg/mL (ref 180–914)

## 2022-05-18 LAB — PHENOBARBITAL LEVEL: Phenobarbital: 13.6 ug/mL — ABNORMAL LOW (ref 15.0–40.0)

## 2022-05-18 LAB — HEMOGLOBIN A1C
Hgb A1c MFr Bld: 5 % (ref 4.8–5.6)
Mean Plasma Glucose: 96.8 mg/dL

## 2022-05-18 LAB — PHENYTOIN LEVEL, TOTAL: Phenytoin Lvl: 20.1 ug/mL — ABNORMAL HIGH (ref 10.0–20.0)

## 2022-05-18 MED ORDER — SODIUM CHLORIDE 0.9 % IV SOLN
500.0000 mg | Freq: Once | INTRAVENOUS | Status: AC
  Administered 2022-05-18: 500 mg via INTRAVENOUS
  Filled 2022-05-18: qty 3.85

## 2022-05-18 MED ORDER — FOLIC ACID 5 MG/ML IJ SOLN
1.0000 mg | Freq: Once | INTRAMUSCULAR | Status: AC
  Administered 2022-05-18: 1 mg via INTRAVENOUS
  Filled 2022-05-18: qty 0.2

## 2022-05-18 NOTE — Progress Notes (Signed)
LTM maint complete - no skin breakdown  Reapplied head wrap. [facility] monitored, Event button test confirmed by [facility].

## 2022-05-18 NOTE — Plan of Care (Signed)

## 2022-05-18 NOTE — Progress Notes (Addendum)
Subjective: No clinical seizures.  However continues to be nonverbal.  ROS: negative except above  Examination  Vital signs in last 24 hours: Temp:  [97.5 F (36.4 C)-98.8 F (37.1 C)] 98.4 F (36.9 C) (07/31 0752) Pulse Rate:  [66-112] 77 (07/31 0752) Resp:  [12-23] 17 (07/31 0359) BP: (101-164)/(73-90) 129/76 (07/31 0752) SpO2:  [97 %-100 %] 100 % (07/31 0752) Weight:  [58.9 kg] 58.9 kg (07/30 2218)  General: lying in bed, NAD Neuro: Opens eyes to repeated tactile stimulation, looks at examiner but does not follow commands, PERRLA, no gaze deviation, antigravity strength in all 4 extremities  Basic Metabolic Panel: Recent Labs  Lab 05/17/22 1538 05/17/22 1542  NA 135 136  K 3.5 4.4  CL 106 104  CO2 20*  --   GLUCOSE 151* 158*  BUN 6* 6*  CREATININE 0.77 0.60  CALCIUM 8.9  --     CBC: Recent Labs  Lab 05/17/22 1538 05/17/22 1542  WBC 7.1  --   NEUTROABS 6.0  --   HGB 12.4 13.9  HCT 39.0 41.0  MCV 102.9*  --   PLT 171  --      Coagulation Studies: Recent Labs    05/17/22 1538  LABPROT 13.0  INR 1.0    Imaging CT head without contrast 05/17/2022: 1. Soft tissue swelling over the occipital scalp without underlying fracture. This may reflect trauma. 2. No acute intracranial abnormality or significant interval change. 3. Stable mild white matter disease. This likely reflects the sequela of chronic microvascular ischemia. 4. Aspects 10/10  MRI brain without contrast 03/06/2022: Evaluation is limited by motion and study incompleteness. Within this limitation, no acute intracranial process. No evidence of acute or subacute infarct   ASSESSMENT AND PLAN: 66 year old female with history of epilepsy, alcohol use disorder and medication noncompliance who presented with convulsive status epilepticus in the setting of alcohol use.  Epilepsy with breakthrough seizure Convulsive status epilepticus, resolved Acute encephalopathy, due to seizure -EEG improved after  fosphenytoin overnight but patient continues to be nonverbal.  Recommendations -We will give IV phenobarbital 500 mg once.  Will check phenobarbital level after loading dose -Continue Keppra 1000 mg twice daily Continue phenytoin 100 mg every 8 hours.  We will check phenytoin level -Continue LTM EEG overnight -CIWA protocol -If requiring repeated Ativan, can consider starting phenobarbital taper or Valium taper -Continue seizure precautions overnight -As needed IV Ativan 2 mg for clinical seizure-like activity -Management of rest of comorbidities per primary team -Discussed plan with Dr. Laural Benes  I have spent a total of 38 minutes with the patient reviewing hospital notes,  test results, labs and examining the patient as well as establishing an assessment and plan.  > 50% of time was spent in direct patient care.    Lindie Spruce Epilepsy [facility] For questions after 5pm please refer to AMION to reach the Neurologist on call

## 2022-05-18 NOTE — Procedures (Addendum)
Patient Name: Dayjah Selman  MRN: [id number]  Epilepsy Attending: Charlsie Quest  Referring Physician/Provider: Rejeana Brock, MD  Duration: 05/17/2022 1656 to 05/18/2022 1656   Patient history:  66 year old female who presented in status epilepticus. EEG to evaluate for seizure   Level of alertness:  lethargic    AEDs during EEG study: LEV, PHT   Technical aspects: This EEG study was done with scalp electrodes positioned according to the 10-20 International system of electrode placement. Electrical activity was reviewed with band pass filter of 1-70Hz , sensitivity of 7 uV/mm, display speed of 55mm/sec with a 60Hz  notched filter applied as appropriate. EEG data were recorded continuously and digitally stored.  Video monitoring was available and reviewed as appropriate.   Description: At the beginning of the study, EEG showed 2 to 3 Hz sharply contoured rhythmic delta slowing admixed with 1.5 to 2 Hz spike and wave in the right hemisphere, maximal right temporal region with evolution in morphology and frequency.  No clinical changes were noted during this EEG pattern.  Consistent with focal nonconvulsive status epilepticus.  IV fosphenytoin was administered on 05/17/2022 at around 2100 after which status epilepticus resolved.  EEG then showed lateralized periodic discharges with overriding fast activity in right hemisphere, maximal right temporal region at 1 to 2 Hz which at times appears rhythmic consistent with brief ictal-interictal rhythmic discharges. Additionally there is continuous generalized 2 to 3 Hz delta slowing with overriding 15 to 18 Hz beta activity.  Hyperventilation and photic stimulation were not performed.     Of note, part of EEG was not recorded due to patient being moved from emergency room to inpatient room..    ABNORMALITY -Electrographic status epilepticus, right hemisphere, maximal right temporal region -Brief ictal interictal rhythmic discharges, right  hemisphere, maximal right temporal region -Lateralized periodic discharges with overriding fast activity, right hemisphere, maximal right temporal region - Continuous slow, generalized   IMPRESSION: This study was initially suggestive of electrographic status epilepticus arising from right hemisphere, maximal right temporal region. IV fosphenytoin was administered on 05/17/2022 at around 2100 after with status epilepticus resolved.  EEG then showed evidence of epileptogenicity arising from right hemisphere, maximal right temporal region which is on the ictal-interictal continuum with high potential for seizure recurrence.  Additionally there is moderate to severe diffuse encephalopathy, nonspecific etiology.    Maryjo Ragon [id number]

## 2022-05-18 NOTE — Progress Notes (Signed)
EEG maintenance done. No skin breakdown. [facility] now monitoring. Event button was tested.

## 2022-05-18 NOTE — Progress Notes (Signed)
PROGRESS NOTE   [name]  YQM:[id number] DOB: 12-Jan-1956 DOA: 05/17/2022 PCP: [facility]   Chief Complaint  Patient presents with   Seizures   Level of care: Progressive  Brief Admission History:  66 y.o. female with medical history significant of alcohol abuse, alcohol withdrawal seizure, anxiety/depression, sent from home for evaluation of recurrent seizures.   Patient nonverbal and probably confused at this point postictal, I tried to reach patient significant other and another contact number but both contacts hung up the phone immediately after picking up and never picked up again. So all obtained by review ED record and talking to ED staff.  It appears that patient was found around the evening of this morning by her boyfriend on the floor with seizure-like activity whole body shaking.  EMS alcohol and patient had another episode of seizure in ambulance and was given Ativan and broke.  Patient mentation however did not recover after seizure broke.   ED Course: Afebrile, no Tachycardia no hypotension.  CT head and neck negative for acute findings.   Assessment and Plan:  Status Epilepticus  - presented with status epilepticus of unknown duration (at least 1 hour) - appreciate neurology consultation, planning on continuous EEG monitoring.  - loaded with keppra and on keppra 100 mg IV BID - loaded with fosphenytoin in ED and remains on IV dilantin q8h per neuro group - IV lorazepam ordered PRN seizures  - pt seems able to protect airway at this time, following closely in progressive unit   Alcohol abuse / suspected alcohol withdrawal seizures - antiepileptics as noted above  - continue seizure precautions - continuous EEG monitoring  - CIWA protocol, NPO status  Acute metabolic encephalopathy - secondary to status epilepticus - follow up UDS - continue supportive measures   Chronic back pain - holding oral meds while she is NPO - hopefully can  restart gabapentin when able to take oral again   Hyperglycemia - check A1c - add cbg monitoring if persists - anticipate stress induced reaction   DVT prophylaxis: enoxaparin  Code Status: Full  Family Communication: none present  Disposition: Status is: Inpatient Remains inpatient appropriate because: intensity    Consultants:  Neurology  Procedures:  LTM EEG monitoring  Antimicrobials:  N/a   Subjective: Pt remains obtunded, unresponsive, nonverbal, eyes open but staring and not tracking.   Objective: Vitals:   05/17/22 2218 05/18/22 0000 05/18/22 0359 05/18/22 0752  BP: 117/73 101/75 116/80 129/76  Pulse: 70 66 75 77  Resp: 15 13 17    Temp: 98.6 F (37 C) 98.8 F (37.1 C) 98.2 F (36.8 C) 98.4 F (36.9 C)  TempSrc: Oral Oral Oral Oral  SpO2: 99% 97% 97% 100%  Weight: 58.9 kg       Intake/Output Summary (Last 24 hours) at 05/18/2022 1046 Last data filed at 05/18/2022 0600 Gross per 24 hour  Intake 432.89 ml  Output 150 ml  Net 282.89 ml   Filed Weights   05/17/22 2218  Weight: 58.9 kg   Examination:  General exam: severely encephalopathic. Eyes open but not tracking. Not following commands.  Respiratory system: Clear to auscultation. Respiratory effort normal. Cardiovascular system: normal S1 & S2 heard. No JVD, murmurs, rubs, gallops or clicks. No pedal edema. Gastrointestinal system: Abdomen is nondistended, soft and nontender. No organomegaly or masses felt. Normal bowel sounds heard. Central nervous system: severely encephalopathic. No focal neurological deficits. Extremities: no gross lesions.  Skin: No rashes, lesions or ulcers. Psychiatry: unable  to determine.    Data Reviewed: I have personally reviewed following labs and imaging studies  CBC: Recent Labs  Lab 2022/06/11 1538 06/11/2022 1542  WBC 7.1  --   NEUTROABS 6.0  --   HGB 12.4 13.9  HCT 39.0 41.0  MCV 102.9*  --   PLT 171  --     Basic Metabolic Panel: Recent Labs  Lab  06-11-2022 1538 06-11-22 1542  NA 135 136  K 3.5 4.4  CL 106 104  CO2 20*  --   GLUCOSE 151* 158*  BUN 6* 6*  CREATININE 0.77 0.60  CALCIUM 8.9  --     CBG: No results for input(s): "GLUCAP" in the last 168 hours.  Recent Results (from the past 240 hour(s))  Resp Panel by RT-PCR (Flu A&B, Covid) Anterior Nasal Swab     Status: None   Collection Time: June 11, 2022  3:24 PM   Specimen: Anterior Nasal Swab  Result Value Ref Range Status   SARS Coronavirus 2 by RT PCR NEGATIVE NEGATIVE Final    Comment: (NOTE) SARS-CoV-2 target nucleic acids are NOT DETECTED.  The SARS-CoV-2 RNA is generally detectable in upper respiratory specimens during the acute phase of infection. The lowest concentration of SARS-CoV-2 viral copies this assay can detect is 138 copies/mL. A negative result does not preclude SARS-Cov-2 infection and should not be used as the sole basis for treatment or other patient management decisions. A negative result may occur with  improper specimen collection/handling, submission of specimen other than nasopharyngeal swab, presence of viral mutation(s) within the areas targeted by this assay, and inadequate number of viral copies(<138 copies/mL). A negative result must be combined with clinical observations, patient history, and epidemiological information. The expected result is Negative.  Fact Sheet for Patients:  BloggerCourse.com  Fact Sheet for Healthcare Providers:  SeriousBroker.it  This test is no t yet approved or cleared by the [place] FDA and  has been authorized for detection and/or diagnosis of SARS-CoV-2 by FDA under an Emergency Use Authorization (EUA). This EUA will remain  in effect (meaning this test can be used) for the duration of the COVID-19 declaration under Section 564(b)(1) of the Act, 21 U.S.C.section 360bbb-3(b)(1), unless the authorization is terminated  or revoked sooner.        Influenza A by PCR NEGATIVE NEGATIVE Final   Influenza B by PCR NEGATIVE NEGATIVE Final    Comment: (NOTE) The Xpert Xpress SARS-CoV-2/FLU/RSV plus assay is intended as an aid in the diagnosis of influenza from Nasopharyngeal swab specimens and should not be used as a sole basis for treatment. Nasal washings and aspirates are unacceptable for Xpert Xpress SARS-CoV-2/FLU/RSV testing.  Fact Sheet for Patients: BloggerCourse.com  Fact Sheet for Healthcare Providers: SeriousBroker.it  This test is not yet approved or cleared by the [place] FDA and has been authorized for detection and/or diagnosis of SARS-CoV-2 by FDA under an Emergency Use Authorization (EUA). This EUA will remain in effect (meaning this test can be used) for the duration of the COVID-19 declaration under Section 564(b)(1) of the Act, 21 U.S.C. section 360bbb-3(b)(1), unless the authorization is terminated or revoked.  Performed at [facility], [address] [place], [place] [address]      Radiology Studies: EEG adult  Result Date: Jun 11, 2022 Charlsie Quest, MD     06/11/2022  8:10 PM Patient Name: Lajuana Patchell MRN: [id number] Epilepsy Attending: Charlsie Quest Referring Physician/Provider: Rejeana Brock, MD Date: 2022-06-11 Duration: 22.05 mins Patient  history:  67 year old female who presented in status epilepticus. EEG to evaluate for seizure Level of alertness:  lethargic AEDs during EEG study: LEV, Technical aspects: This EEG study was done with scalp electrodes positioned according to the 10-20 International system of electrode placement. Electrical activity was reviewed with band pass filter of 1-70Hz , sensitivity of 7 uV/mm, display speed of 36mm/sec with a 60Hz  notched filter applied as appropriate. EEG data were recorded continuously and digitally stored.  Video monitoring was available and reviewed as appropriate. Description: No  posterior dominant rhythm was seen. EEG showed continuous 2-3Hz  delta slowing admixed with an excessive amount of 15 to 18 Hz beta activity in left hemisphere. There is sharply contoured 2-3hz  delta slowing admixed with 15-18hz  beta activity in right hemisphere, maximal right temporal region which at times appears rhythmic with evolution in frequency and morphology.No clinical changes are noted during this EEG change.  Hyperventilation and photic stimulation were not performed.   ABNORMALITY - Lateralized rhythmic delta slowing, right hemisphere, maximal right temporal region - Continuous slow, left hemisphere IMPRESSION: This study is suggestive of cortical dysfunction arising from right hemisphere, maximal temporal region which is on the ictal-interictal continuum. Please obtain long term monitoring for further characterization. Additionally there is moderate to severe diffuse encephalopathy, nonspecific etiology. Priyanka   CT CERVICAL SPINE WO CONTRAST  Result Date: 05/17/2022 CLINICAL DATA:  Neck trauma (Age >= 65y) EXAM: CT CERVICAL SPINE WITHOUT CONTRAST TECHNIQUE: Multidetector CT imaging of the cervical spine was performed without intravenous contrast. Multiplanar CT image reconstructions were also generated. RADIATION DOSE REDUCTION: This exam was performed according to the departmental dose-optimization program which includes automated exposure control, adjustment of the mA and/or kV according to patient size and/or use of iterative reconstruction technique. COMPARISON:  12/11/2018 FINDINGS: Alignment: Facet joints are aligned without dislocation or traumatic listhesis. Dens and lateral masses are aligned. Skull base and vertebrae: Prior C4-C7 ACDF. No evidence of acute fracture. Soft tissues and spinal canal: No prevertebral fluid or swelling. No visible canal hematoma. Disc levels:  Similar degree of multilevel spondylosis. Upper chest: Negative. Other: None. IMPRESSION: 1. No acute fracture  or traumatic listhesis of the cervical spine. 2. Prior C4-C7 ACDF. Electronically Signed   By: 12/13/2018 D.O.   On: 05/17/2022 16:35   CT HEAD CODE STROKE WO CONTRAST  Result Date: 05/17/2022 CLINICAL DATA:  Code stroke.  Seizure. EXAM: CT HEAD WITHOUT CONTRAST TECHNIQUE: Contiguous axial images were obtained from the base of the skull through the vertex without intravenous contrast. RADIATION DOSE REDUCTION: This exam was performed according to the departmental dose-optimization program which includes automated exposure control, adjustment of the mA and/or kV according to patient size and/or use of iterative reconstruction technique. COMPARISON:  CT head without contrast 04/02/2022. MR head without contrast 03/06/2022 FINDINGS: Brain: Mild periventricular and subcortical white matter hypoattenuation is again seen bilaterally. No acute infarct, hemorrhage, or mass lesion is present. Basal ganglia are intact. Insular ribbon is normal bilaterally. No acute or focal cortical abnormality is present. Temporal lobes are unremarkable. The ventricles are of normal size. No significant extraaxial fluid collection is present. The craniocervical junction is normal. Upper cervical spine is within normal limits. Marrow signal is unremarkable. Vascular: No hyperdense vessel or unexpected calcification. Skull: Calvarium is intact. No focal lytic or blastic lesions are present. Soft tissue swelling is present over the occipital scalp. This may reflect trauma. No underlying fracture is present. No foreign body is present. Sinuses/Orbits: The paranasal sinuses and mastoid  air cells are clear. The globes and orbits are within normal limits. ASPECTS [facility] Stroke Program Early CT Score) - Ganglionic level infarction (caudate, lentiform nuclei, internal capsule, insula, M1-M3 cortex): 7/7 - Supraganglionic infarction (M4-M6 cortex): 3/3 Total score (0-10 with 10 being normal): 10/10 IMPRESSION: 1. Soft tissue swelling over  the occipital scalp without underlying fracture. This may reflect trauma. 2. No acute intracranial abnormality or significant interval change. 3. Stable mild white matter disease. This likely reflects the sequela of chronic microvascular ischemia. 4. Aspects 10/10 * The above was relayed via text pager to Dr. Amada Jupiter on 05/17/2022 at 15:52 . Electronically Signed   By: Marin Roberts M.D.   On: 05/17/2022 15:53    Scheduled Meds:  enoxaparin (LOVENOX) injection  40 mg Subcutaneous Q24H   FLUoxetine  20 mg Oral Daily   gabapentin  100 mg Oral TID   multivitamin with minerals  1 tablet Oral Daily   phenytoin (DILANTIN) IV  100 mg Intravenous Q8H   thiamine  100 mg Oral Daily   Or   thiamine  100 mg Intravenous Daily   Continuous Infusions:  sodium chloride 75 mL/hr (05/18/22 0600)   folic acid 1 mg in sodium chloride 0.9 % 50 mL IVPB     levETIRAcetam 1,000 mg (05/18/22 0954)     LOS: 1 day   Critical Care Procedure Note Authorized and Performed by: Maryln Manuel MD  Total Critical Care time:  50 mins Due to a high probability of clinically significant, life threatening deterioration, the patient required my highest level of preparedness to intervene emergently and I personally spent this critical care time directly and personally managing the patient.  This critical care time included obtaining a history; examining the patient, pulse oximetry; ordering and review of studies; arranging urgent treatment with development of a management plan; evaluation of patient's response of treatment; frequent reassessment; and discussions with other providers.  This critical care time was performed to assess and manage the high probability of imminent and life threatening deterioration that could result in multi-organ failure.  It was exclusive of separately billable procedures and treating other patients and teaching time.    Standley Dakins, MD How to contact the [facility] Attending or Consulting  provider 7A - 7P or covering provider during after hours 7P -7A, for this patient?  Check the care team in [facility] and look for a) attending/consulting TRH provider listed and b) the [facility] team listed Log into [email or web address] and use [place]'s universal password to access. If you do not have the password, please contact the hospital operator. Locate the [facility] provider you are looking for under [facility] and page to a number that you can be directly reached. If you still have difficulty reaching the provider, please page the [facility] (Director on Call) for the Hospitalists listed on amion for assistance.  05/18/2022, 10:46 AM

## 2022-05-18 NOTE — Progress Notes (Signed)
S: Cerebyx loaded in the ED. Patient brought up to 3W with LTM EEG equipment. Equipment hooked back up and EEG computer restarted after patient brought up from the ED; appreciate telephone assistance from EEG tech.   O: BP 101/75 (BP Location: Left Arm)   Pulse 66   Temp 98.8 F (37.1 C) (Oral)   Resp 13   SpO2 97%   Exam: Patient sleeping peacefully. No jerking, twitching, myoclonus, facial twitching or other clinical seizure activity seen.   LTM EEG reviewed at the bedside. Tracings reveal intermittent slow waves in the context of patient being asleep; the findings may reflect lateralized rhythmic delta slowing as was seen by Dr. Melynda Ripple on her review of spot EEG which was obtained at 4:44 PM. No electrographic seizures appreciated.   A/R: 66 year old female who presented in status epilepticus with seizures for an unknown period of time prior to admission, but at least approximately an hour.  Unclear if she was compliant with her Keppra at home.  - Lateralized rhythmic delta slowing on EEG, a pattern that is on the ictal-interictal continuum.  - No clinical seizure activity on follow up exam - Continuing LTM EEG. - IV Ativan PRN seizure  - Continue Keppra 1000 mg IV BID - Loaded with fosphenytoin in the ED: 1225 mg PE. Continuing with scheduled IV Dilantin q8h  Electronically signed: Dr. Caryl Pina

## 2022-05-18 NOTE — TOC Initial Note (Signed)
Transition of Care [facility]) - Initial/Assessment Note    Patient Details  Name: Kelena Garrow MRN: [id number] Date of Birth: 1955/12/12  [facility]) CM/SW Contact:    Kermit Balo, RN Phone Number: 05/18/2022, 4:16 PM  Clinical Narrative:                 Pt lives in a motel with her significant other. She has frequent admissions for seizures/ alcohol withdrawal.  TOC following.  Expected Discharge Plan: Home/Self Care Barriers to Discharge: Continued Medical Work up   Patient Goals and CMS Choice        Expected Discharge Plan and Services Expected Discharge Plan: Home/Self Care       Living arrangements for the past 2 months: Hotel/Motel                                      Prior Living Arrangements/Services Living arrangements for the past 2 months: Hotel/Motel Lives with:: Significant Other                   Activities of Daily Living      Permission Sought/Granted                  Emotional Assessment              Admission diagnosis:  Seizure [facility]) [R56.9] Patient Active Problem List   Diagnosis Date Noted   Seizure (HCC) 05/17/2022   Status epilepticus (HCC)    Hypokalemia    Alcohol-induced mood disorder (HCC)    Closed displaced oblique fracture of shaft of left humerus 08/01/2020   GERD (gastroesophageal reflux disease) 02/28/2020   Alcohol withdrawal syndrome, with delirium (HCC) 12/13/2018   Thrombocytopenia (HCC) 12/13/2018   Neuropathy 12/13/2018   Subarachnoid hemorrhage (HCC) 12/11/2018   Seizure due to alcohol withdrawal (HCC) 12/11/2018   Major depressive disorder, 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 [place] [address] Phone: [phone] Fax: [phone]  [facility] [address] [place] [address] Phone: [phone] Fax: [phone]     Social Determinants of Health (SDOH) Interventions    Readmission Risk Interventions     No data to display

## 2022-05-19 DIAGNOSIS — G40901 Epilepsy, unspecified, not intractable, with status epilepticus: Secondary | ICD-10-CM | POA: Diagnosis not present

## 2022-05-19 DIAGNOSIS — F1020 Alcohol dependence, uncomplicated: Secondary | ICD-10-CM | POA: Diagnosis not present

## 2022-05-19 DIAGNOSIS — R569 Unspecified convulsions: Secondary | ICD-10-CM | POA: Diagnosis not present

## 2022-05-19 DIAGNOSIS — R4182 Altered mental status, unspecified: Secondary | ICD-10-CM | POA: Diagnosis not present

## 2022-05-19 LAB — BASIC METABOLIC PANEL
Anion gap: 9 (ref 5–15)
Anion gap: 9 (ref 5–15)
BUN: 5 mg/dL — ABNORMAL LOW (ref 8–23)
BUN: 5 mg/dL — ABNORMAL LOW (ref 8–23)
CO2: 22 mmol/L (ref 22–32)
CO2: 21 mmol/L — ABNORMAL LOW (ref 22–32)
Calcium: 8.4 mg/dL — ABNORMAL LOW (ref 8.9–10.3)
Calcium: 8.1 mg/dL — ABNORMAL LOW (ref 8.9–10.3)
Chloride: 106 mmol/L (ref 98–111)
Chloride: 104 mmol/L (ref 98–111)
Creatinine, Ser: 0.59 mg/dL (ref 0.44–1.00)
Creatinine, Ser: 0.54 mg/dL (ref 0.44–1.00)
GFR, Estimated: >60 mL/min (ref >60)
GFR, Estimated: >60 mL/min (ref >60)
Glucose, Bld: 99 mg/dL (ref 70–99)
Glucose, Bld: 86 mg/dL (ref 70–99)
Potassium: 2.7 mmol/L — CL (ref 3.5–5.1)
Potassium: 3.1 mmol/L — ABNORMAL LOW (ref 3.5–5.1)
Sodium: 137 mmol/L (ref 135–145)
Sodium: 134 mmol/L — ABNORMAL LOW (ref 135–145)

## 2022-05-19 LAB — MAGNESIUM: Magnesium: 1.7 mg/dL (ref 1.7–2.4)

## 2022-05-19 MED ORDER — PHENOBARBITAL SODIUM 65 MG/ML IJ SOLN
65.0000 mg | Freq: Three times a day (TID) | INTRAMUSCULAR | Status: DC
  Administered 2022-05-19 – 2022-05-20 (×3): 65 mg via INTRAVENOUS
  Filled 2022-05-19 (×4): qty 1

## 2022-05-19 MED ORDER — MAGNESIUM SULFATE 2 GM/50ML IV SOLN
2.0000 g | Freq: Once | INTRAVENOUS | Status: AC
  Administered 2022-05-19: 2 g via INTRAVENOUS
  Filled 2022-05-19: qty 50

## 2022-05-19 MED ORDER — POTASSIUM CHLORIDE 10 MEQ/100ML IV SOLN
10.0000 meq | INTRAVENOUS | Status: AC
  Administered 2022-05-19 (×5): 10 meq via INTRAVENOUS
  Filled 2022-05-19 (×5): qty 100

## 2022-05-19 NOTE — Progress Notes (Signed)
Subjective: Continues to have lateralized periodic discharges and brief ictal-interictal discharges on the right side.  ROS: Unable to obtain due to poor mental status  Examination  Vital signs in last 24 hours: Temp:  [98.2 F (36.8 C)-98.7 F (37.1 C)] 98.7 F (37.1 C) (08/01 0803) Pulse Rate:  [76-90] 84 (08/01 0803) Resp:  [14-16] 14 (08/01 0803) BP: (128-141)/(73-87) 130/79 (08/01 0803) SpO2:  [97 %-100 %] 100 % (08/01 0803)  General: lying in bed, NAD Neuro: Awake, able to tell me her name, able to correctly state that she is in the hospital with given 2 different choices, follows some simple commands intermittently but unable to name objects, perseverating, antigravity strength in all 4 extremities with right hemiparesis   Basic Metabolic Panel: Recent Labs  Lab 05/17/22 1538 05/17/22 1542 05/19/22 0248  NA 135 136 137  K 3.5 4.4 2.7*  CL 106 104 106  CO2 20*  --  22  GLUCOSE 151* 158* 99  BUN 6* 6* 5*  CREATININE 0.77 0.60 0.59  CALCIUM 8.9  --  8.4*  MG  --   --  1.7    CBC: Recent Labs  Lab 05/17/22 1538 05/17/22 1542  WBC 7.1  --   NEUTROABS 6.0  --   HGB 12.4 13.9  HCT 39.0 41.0  MCV 102.9*  --   PLT 171  --      Coagulation Studies: Recent Labs    05/17/22 1538  LABPROT 13.0  INR 1.0    Imaging No new brain imaging overnight  ASSESSMENT AND PLAN: 66 year old female with history of epilepsy, alcohol use disorder and medication noncompliance who presented with convulsive status epilepticus in the setting of alcohol use.   Epilepsy with breakthrough seizure Convulsive status epilepticus, resolved Acute encephalopathy, due to seizure -EEG improved after fosphenytoin overnight but patient continues to be nonverbal.   Recommendations -Start phenobarbital 65 mg 3 times daily.  If EEG improves and patient's mental status improves, we will plan to taper this off. -Continue Keppra 1000 mg twice daily Continue phenytoin 100 mg every 8 hours.    -Continue LTM EEG overnight -CIWA protocol -Continue seizure precautions overnight -As needed IV Ativan 2 mg for clinical seizure-like activity -Management of rest of comorbidities per primary team -Discussed plan with Dr. Jarvis Newcomer   I have spent a total of 40 minutes with the patient reviewing hospital notes,  test results, labs and examining the patient as well as establishing an assessment and plan.  > 50% of time was spent in direct patient care.    Lindie Spruce Epilepsy [facility] For questions after 5pm please refer to AMION to reach the Neurologist on call

## 2022-05-19 NOTE — Progress Notes (Signed)
LTM maint complete - no skin breakdown under: Fp2 F4 [facility] monitored, Event button test confirmed by [facility].  

## 2022-05-19 NOTE — Progress Notes (Signed)
LTM maint complete - no skin breakdown under:  FP1 FP2 

## 2022-05-19 NOTE — Progress Notes (Signed)
Progress Note  Patient: [name] [id number] DOB: [birth year]  DOA: 05/17/2022  DOS: 05/19/2022    Brief hospital course: 66 y.o. female with medical history significant of alcohol abuse, alcohol withdrawal seizure, anxiety/depression, sent from home for evaluation of recurrent seizures.   Patient nonverbal and probably confused at this point postictal, I tried to reach patient significant other and another contact number but both contacts hung up the phone immediately after picking up and never picked up again. So all obtained by review ED record and talking to ED staff.  It appears that patient was found around the evening of this morning by her boyfriend on the floor with seizure-like activity whole body shaking.  EMS alcohol and patient had another episode of seizure in ambulance and was given Ativan and broke.  Patient mentation however did not recover after seizure broke.   ED Course: Afebrile, no Tachycardia no hypotension.  CT head and neck negative for acute findings.  Assessment and Plan: Status epilepticus: Unclear duration, suspect at least 1 hour.  - Due to persistent PLEDs, neurology starting phenobarbital 65mg  TID for now.  - Continuing EEG  - Continue keppra 1,000mg  BID - Continue phenytoin 100mg  q8h - prn ativan ordered - Continue seizure precautions - D/w Dr.  Alcohol abuse and withdrawal:  - Continue CIWA protocol - IVF  Acute metabolic encephalopathy, post-ictal encephalopathy:  - Delirium precautions - Will request SLP evaluation once more alert in effort to begin enteral nutrition  Chronic back pain:  - Continue low dose gabapentin  Hyperglycemia without diabetes: HbA1c 5.0%.  - Monitor with labs  Depression:  - Continue fluoxetine  Right arm swelling: Due to patient laying on that side, will recommend elevation. If persistent, check U/S.  Subjective: Pt repeats answers sometimes appropriately, mostly inappropriately. Knows she's in a  hospital, thinks it is in [place], knows her DOB but not date/year.   Objective: Vitals:   05/19/22 0433 05/19/22 0803 05/19/22 1125 05/19/22 1500  BP: 128/73 130/79 116/70 123/75  Pulse: 76 84 78 80  Resp:  14 18 18   Temp: 98.2 F (36.8 C) 98.7 F (37.1 C) 98.6 F (37 C) 99.4 F (37.4 C)  TempSrc: Axillary Oral Oral Oral  SpO2: 97% 100% 100% 98%  Weight:       Gen: Chronically ill-appearing 66 y.o. female in no distress Pulm: Nonlabored breathing room air. Clear, no stridor. CV: Regular rate and rhythm. No murmur, rub, or gallop. No JVD, no pitting dependent edema. GI: Abdomen soft, non-tender, non-distended, with normoactive bowel sounds.  Ext: Warm, no deformities Skin: No rashes, lesions or ulcers on visualized skin. Unable to examine occiput today. Neuro: Drowsy but rousable, maintaining airway, PERRL. Bradyphrenic, not moving RUE which is swollen, has been laying on it. Exam otherwise limited by cooperation. Psych: Judgement and insight appear impaired.    Data Personally reviewed: CBC: Recent Labs  Lab 05/17/22 1538 05/17/22 1542  WBC 7.1  --   NEUTROABS 6.0  --   HGB 12.4 13.9  HCT 39.0 41.0  MCV 102.9*  --   PLT 171  --    Basic Metabolic Panel: Recent Labs  Lab 05/17/22 1538 05/17/22 1542 05/19/22 0248 05/19/22 1028  NA 135 136 137 134*  K 3.5 4.4 2.7* 3.1*  CL 106 104 106 104  CO2 20*  --  22 21*  GLUCOSE 151* 158* 99 86  BUN 6* 6* 5* 5*  CREATININE 0.77 0.60 0.59 0.54  CALCIUM 8.9  --  8.4*  8.1*  MG  --   --  1.7  --    GFR: CrCl cannot be calculated (Unknown ideal weight.). Liver Function Tests: Recent Labs  Lab 05/17/22 1538  AST 36  ALT 18  ALKPHOS 59  BILITOT 0.7  PROT 6.8  ALBUMIN 3.8   No results for input(s): "LIPASE", "AMYLASE" in the last 168 hours. No results for input(s): "AMMONIA" in the last 168 hours. Coagulation Profile: Recent Labs  Lab 05/17/22 1538  INR 1.0   Cardiac Enzymes: Recent Labs  Lab 05/17/22 1538   CKTOTAL 83   BNP (last 3 results) No results for input(s): "PROBNP" in the last 8760 hours. HbA1C: Recent Labs    05/17/22 1538  HGBA1C 5.0   CBG: No results for input(s): "GLUCAP" in the last 168 hours. Lipid Profile: No results for input(s): "CHOL", "HDL", "LDLCALC", "TRIG", "CHOLHDL", "LDLDIRECT" in the last 72 hours. Thyroid Function Tests: No results for input(s): "TSH", "T4TOTAL", "FREET4", "T3FREE", "THYROIDAB" in the last 72 hours. Anemia Panel: Recent Labs    05/18/22 1253  VITAMINB12 555   Urine analysis:    Component Value Date/Time   COLORURINE YELLOW 05/18/2022 0400   APPEARANCEUR CLEAR 05/18/2022 0400   LABSPEC 1.011 05/18/2022 0400   PHURINE 6.0 05/18/2022 0400   GLUCOSEU NEGATIVE 05/18/2022 0400   HGBUR NEGATIVE 05/18/2022 0400   BILIRUBINUR NEGATIVE 05/18/2022 0400   KETONESUR NEGATIVE 05/18/2022 0400   PROTEINUR NEGATIVE 05/18/2022 0400   UROBILINOGEN 0.2 07/14/2019 1451   NITRITE NEGATIVE 05/18/2022 0400   LEUKOCYTESUR NEGATIVE 05/18/2022 0400   Recent Results (from the past 240 hour(s))  Resp Panel by RT-PCR (Flu A&B, Covid) Anterior Nasal Swab     Status: None   Collection Time: 05/17/22  3:24 PM   Specimen: Anterior Nasal Swab  Result Value Ref Range Status   SARS Coronavirus 2 by RT PCR NEGATIVE NEGATIVE Final    Comment: (NOTE) SARS-CoV-2 target nucleic acids are NOT DETECTED.  The SARS-CoV-2 RNA is generally detectable in upper respiratory specimens during the acute phase of infection. The lowest concentration of SARS-CoV-2 viral copies this assay can detect is 138 copies/mL. A negative result does not preclude SARS-Cov-2 infection and should not be used as the sole basis for treatment or other patient management decisions. A negative result may occur with  improper specimen collection/handling, submission of specimen other than nasopharyngeal swab, presence of viral mutation(s) within the areas targeted by this assay, and inadequate  number of viral copies(<138 copies/mL). A negative result must be combined with clinical observations, patient history, and epidemiological information. The expected result is Negative.  Fact Sheet for Patients:  BloggerCourse.com  Fact Sheet for Healthcare Providers:  SeriousBroker.it  This test is no t yet approved or cleared by the [place] FDA and  has been authorized for detection and/or diagnosis of SARS-CoV-2 by FDA under an Emergency Use Authorization (EUA). This EUA will remain  in effect (meaning this test can be used) for the duration of the COVID-19 declaration under Section 564(b)(1) of the Act, 21 U.S.C.section 360bbb-3(b)(1), unless the authorization is terminated  or revoked sooner.       Influenza A by PCR NEGATIVE NEGATIVE Final   Influenza B by PCR NEGATIVE NEGATIVE Final    Comment: (NOTE) The Xpert Xpress SARS-CoV-2/FLU/RSV plus assay is intended as an aid in the diagnosis of influenza from Nasopharyngeal swab specimens and should not be used as a sole basis for treatment. Nasal washings and aspirates are unacceptable for Xpert Xpress SARS-CoV-2/FLU/RSV  testing.  Fact Sheet for Patients: EntrepreneurPulse.com.au  Fact Sheet for Healthcare Providers: IncredibleEmployment.be  This test is not yet approved or cleared by the [place] FDA and has been authorized for detection and/or diagnosis of SARS-CoV-2 by FDA under an Emergency Use Authorization (EUA). This EUA will remain in effect (meaning this test can be used) for the duration of the COVID-19 declaration under Section 564(b)(1) of the Act, 21 U.S.C. section 360bbb-3(b)(1), unless the authorization is terminated or revoked.  Performed at [facility], [place] [address], [place], [place] [address]      Overnight EEG with video  Result Date: 05/18/2022 Lora Havens, MD     05/19/2022 10:15 AM  Patient Name: Marlyn Aragones MRN: [id number] Epilepsy Attending: Lora Havens Referring Physician/Provider: Greta Doom, MD Duration: 05/17/2022 1656 to 05/18/2022 1656  Patient history:  66 year old female who presented in status epilepticus. EEG to evaluate for seizure  Level of alertness:  lethargic  AEDs during EEG study: LEV, PHT  Technical aspects: This EEG study was done with scalp electrodes positioned according to the 10-20 International system of electrode placement. Electrical activity was reviewed with band pass filter of 1-70Hz , sensitivity of 7 uV/mm, display speed of 57mm/sec with a 60Hz  notched filter applied as appropriate. EEG data were recorded continuously and digitally stored.  Video monitoring was available and reviewed as appropriate.  Description: At the beginning of the study, EEG showed 2 to 3 Hz sharply contoured rhythmic delta slowing admixed with 1.5 to 2 Hz spike and wave in the right hemisphere, maximal right temporal region with evolution in morphology and frequency.  No clinical changes were noted during this EEG pattern.  Consistent with focal nonconvulsive status epilepticus.  IV fosphenytoin was administered on 05/17/2022 at around 2100 after which status epilepticus resolved.  EEG then showed lateralized periodic discharges with overriding fast activity in right hemisphere, maximal right temporal region at 1 to 2 Hz which at times appears rhythmic consistent with brief ictal-interictal rhythmic discharges. Additionally there is continuous generalized 2 to 3 Hz delta slowing with overriding 15 to 18 Hz beta activity.  Hyperventilation and photic stimulation were not performed.   Of note, part of EEG was not recorded due to patient being moved from emergency room to inpatient room..  ABNORMALITY -Electrographic status epilepticus, right hemisphere, maximal right temporal region -Brief ictal interictal rhythmic discharges, right hemisphere, maximal right temporal region  -Lateralized periodic discharges with overriding fast activity, right hemisphere, maximal right temporal region - Continuous slow, generalized  IMPRESSION: This study was initially suggestive of electrographic status epilepticus arising from right hemisphere, maximal right temporal region. IV fosphenytoin was administered on 05/17/2022 at around 2100 after with status epilepticus resolved.  EEG then showed evidence of epileptogenicity arising from right hemisphere, maximal right temporal region which is on the ictal-interictal continuum with high potential for seizure recurrence.  Additionally there is moderate to severe diffuse encephalopathy, nonspecific etiology.  Lora Havens    EEG adult  Result Date: 05/17/2022 Lora Havens, MD     05/17/2022  8:10 PM Patient Name: Dao Miedema MRN: [id number] Epilepsy Attending: Lora Havens Referring Physician/Provider: Greta Doom, MD Date: 05/17/2022 Duration: 22.05 mins Patient history:  66 year old female who presented in status epilepticus. EEG to evaluate for seizure Level of alertness:  lethargic AEDs during EEG study: LEV, Technical aspects: This EEG study was done with scalp electrodes positioned according to the 10-20 International system of electrode placement. Electrical activity was reviewed  with band pass filter of 1-70Hz , sensitivity of 7 uV/mm, display speed of 40mm/sec with a 60Hz  notched filter applied as appropriate. EEG data were recorded continuously and digitally stored.  Video monitoring was available and reviewed as appropriate. Description: No posterior dominant rhythm was seen. EEG showed continuous 2-3Hz  delta slowing admixed with an excessive amount of 15 to 18 Hz beta activity in left hemisphere. There is sharply contoured 2-3hz  delta slowing admixed with 15-18hz  beta activity in right hemisphere, maximal right temporal region which at times appears rhythmic with evolution in frequency and morphology.No clinical changes  are noted during this EEG change.  Hyperventilation and photic stimulation were not performed.   ABNORMALITY - Lateralized rhythmic delta slowing, right hemisphere, maximal right temporal region - Continuous slow, left hemisphere IMPRESSION: This study is suggestive of cortical dysfunction arising from right hemisphere, maximal temporal region which is on the ictal-interictal continuum. Please obtain long term monitoring for further characterization. Additionally there is moderate to severe diffuse encephalopathy, nonspecific etiology. [place]    Family Communication: None at bedside  Disposition: Status is: Inpatient Remains inpatient appropriate because: Continued antiepileptic Tx/titration, cEEG, alcohol withdrawal management Planned Discharge Destination: TBD  Patrecia Pour, MD 05/19/2022 6:18 PM Page by [email or web address]

## 2022-05-19 NOTE — Procedures (Addendum)
Patient Name: [name]  MRN: [id number]  Epilepsy Attending: Charlsie Quest  Referring Physician/Provider: Rejeana Brock, MD  Duration: 05/18/2022 1656 to 05/19/2022 1656   Patient history:  66 year old female who presented in status epilepticus. EEG to evaluate for seizure   Level of alertness:  lethargic    AEDs during EEG study: LEV, PHT, Phenobarb   Technical aspects: This EEG study was done with scalp electrodes positioned according to the 10-20 International system of electrode placement. Electrical activity was reviewed with band pass filter of 1-70Hz , sensitivity of 7 uV/mm, display speed of 87mm/sec with a 60Hz  notched filter applied as appropriate. EEG data were recorded continuously and digitally stored.  Video monitoring was available and reviewed as appropriate.   Description:  EEG showed lateralized periodic discharges with overriding fast activity in right hemisphere, maximal right temporal region at 1 to 2 Hz which at times appears rhythmic consistent with brief ictal-interictal rhythmic discharges. Additionally there is continuous generalized 2 to 3 Hz delta slowing with overriding 15 to 18 Hz beta activity. Hyperventilation and photic stimulation were not performed.      ABNORMALITY -Brief ictal interictal rhythmic discharges, right hemisphere, maximal right temporal region -Lateralized periodic discharges with overriding fast activity, right hemisphere, maximal right temporal region - Continuous slow, generalized   IMPRESSION: This study showed evidence of epileptogenicity arising from right hemisphere, maximal right temporal region which is on the ictal-interictal continuum with high potential for seizure recurrence. Additionally there is moderate to severe diffuse encephalopathy, nonspecific etiology.    Cloris Flippo 

## 2022-05-20 DIAGNOSIS — G8929 Other chronic pain: Secondary | ICD-10-CM | POA: Diagnosis present

## 2022-05-20 DIAGNOSIS — R739 Hyperglycemia, unspecified: Secondary | ICD-10-CM | POA: Diagnosis present

## 2022-05-20 DIAGNOSIS — R4182 Altered mental status, unspecified: Secondary | ICD-10-CM | POA: Diagnosis not present

## 2022-05-20 DIAGNOSIS — Z91148 Patient's other noncompliance with medication regimen for other reason: Secondary | ICD-10-CM | POA: Diagnosis present

## 2022-05-20 DIAGNOSIS — F1020 Alcohol dependence, uncomplicated: Secondary | ICD-10-CM | POA: Diagnosis not present

## 2022-05-20 DIAGNOSIS — R569 Unspecified convulsions: Secondary | ICD-10-CM | POA: Diagnosis not present

## 2022-05-20 DIAGNOSIS — E8809 Other disorders of plasma-protein metabolism, not elsewhere classified: Secondary | ICD-10-CM | POA: Diagnosis present

## 2022-05-20 DIAGNOSIS — G40901 Epilepsy, unspecified, not intractable, with status epilepticus: Secondary | ICD-10-CM | POA: Diagnosis not present

## 2022-05-20 LAB — CBC
HCT: 35.1 % — ABNORMAL LOW (ref 36.0–46.0)
Hemoglobin: 11.7 g/dL — ABNORMAL LOW (ref 12.0–15.0)
MCH: 32.4 pg (ref 26.0–34.0)
MCHC: 33.3 g/dL (ref 30.0–36.0)
MCV: 97.2 fL (ref 80.0–100.0)
Platelets: 285 10*3/uL (ref 150–400)
RBC: 3.61 MIL/uL — ABNORMAL LOW (ref 3.87–5.11)
RDW: 13.3 % (ref 11.5–15.5)
WBC: 6 10*3/uL (ref 4.0–10.5)
nRBC: 0 % (ref 0.0–0.2)

## 2022-05-20 LAB — COMPREHENSIVE METABOLIC PANEL
ALT: 12 U/L (ref 0–44)
AST: 18 U/L (ref 15–41)
Albumin: 2.8 g/dL — ABNORMAL LOW (ref 3.5–5.0)
Alkaline Phosphatase: 49 U/L (ref 38–126)
Anion gap: 12 (ref 5–15)
BUN: 5 mg/dL — ABNORMAL LOW (ref 8–23)
CO2: 19 mmol/L — ABNORMAL LOW (ref 22–32)
Calcium: 8.3 mg/dL — ABNORMAL LOW (ref 8.9–10.3)
Chloride: 105 mmol/L (ref 98–111)
Creatinine, Ser: 0.5 mg/dL (ref 0.44–1.00)
GFR, Estimated: >60 mL/min (ref >60)
Glucose, Bld: 76 mg/dL (ref 70–99)
Potassium: 2.9 mmol/L — ABNORMAL LOW (ref 3.5–5.1)
Sodium: 136 mmol/L (ref 135–145)
Total Bilirubin: 1 mg/dL (ref 0.3–1.2)
Total Protein: 6 g/dL — ABNORMAL LOW (ref 6.5–8.1)

## 2022-05-20 MED ORDER — PHENOBARBITAL SODIUM 65 MG/ML IJ SOLN
65.0000 mg | Freq: Every day | INTRAMUSCULAR | Status: AC
Start: 2022-05-23 — End: 2022-05-24
  Administered 2022-05-23 – 2022-05-24 (×2): 65 mg via INTRAVENOUS
  Filled 2022-05-20 (×2): qty 1

## 2022-05-20 MED ORDER — POTASSIUM CHLORIDE CRYS ER 20 MEQ PO TBCR
40.0000 meq | EXTENDED_RELEASE_TABLET | Freq: Four times a day (QID) | ORAL | Status: AC
  Administered 2022-05-20 (×2): 40 meq via ORAL
  Filled 2022-05-20 (×2): qty 2

## 2022-05-20 MED ORDER — ACETAMINOPHEN 325 MG PO TABS
650.0000 mg | ORAL_TABLET | ORAL | Status: DC | PRN
  Administered 2022-05-20 – 2022-05-23 (×3): 650 mg via ORAL
  Filled 2022-05-20 (×3): qty 2

## 2022-05-20 MED ORDER — PHENOBARBITAL SODIUM 65 MG/ML IJ SOLN
65.0000 mg | Freq: Two times a day (BID) | INTRAMUSCULAR | Status: AC
  Administered 2022-05-21 – 2022-05-23 (×4): 65 mg via INTRAVENOUS
  Filled 2022-05-20 (×4): qty 1

## 2022-05-20 MED ORDER — PHENOBARBITAL SODIUM 65 MG/ML IJ SOLN
65.0000 mg | Freq: Three times a day (TID) | INTRAMUSCULAR | Status: AC
Start: 2022-05-20 — End: 2022-05-21
  Administered 2022-05-20 – 2022-05-21 (×3): 65 mg via INTRAVENOUS
  Filled 2022-05-20 (×3): qty 1

## 2022-05-20 NOTE — Progress Notes (Signed)
LTM EEG hooked up and running - no initial skin breakdown - push button tested - neuro notified. [facility] monitoring.  

## 2022-05-20 NOTE — Progress Notes (Signed)
Subjective: No definite seizures overnight.  ROS: negative except above  Examination  Vital signs in last 24 hours: Temp:  [97.9 F (36.6 C)-99.4 F (37.4 C)] 98.3 F (36.8 C) (08/02 0753) Pulse Rate:  [76-89] 81 (08/02 0753) Resp:  [15-18] 18 (08/02 0753) BP: (116-145)/(70-82) 134/82 (08/02 0753) SpO2:  [97 %-100 %] 97 % (08/02 0753)  General: lying in bed, NAD Neuro: Awake, alert, oriented to place and person but not to time, follows commands, able to name objects, able to do simple math, cranial nerves II to XII appear grossly intact, 4/5 in all 4 extremities  Basic Metabolic Panel: Recent Labs  Lab 05/17/22 1538 05/17/22 1542 05/19/22 0248 05/19/22 1028 05/20/22 0708  NA 135 136 137 134* 136  K 3.5 4.4 2.7* 3.1* 2.9*  CL 106 104 106 104 105  CO2 20*  --  22 21* 19*  GLUCOSE 151* 158* 99 86 76  BUN 6* 6* 5* 5* 5*  CREATININE 0.77 0.60 0.59 0.54 0.50  CALCIUM 8.9  --  8.4* 8.1* 8.3*  MG  --   --  1.7  --   --     CBC: Recent Labs  Lab 05/17/22 1538 05/17/22 1542 05/20/22 0708  WBC 7.1  --  6.0  NEUTROABS 6.0  --   --   HGB 12.4 13.9 11.7*  HCT 39.0 41.0 35.1*  MCV 102.9*  --  97.2  PLT 171  --  285     Coagulation Studies: Recent Labs    05/17/22 1538  LABPROT 13.0  INR 1.0    Imaging No new brain imaging overnight   ASSESSMENT AND PLAN: 66 year old female with history of epilepsy, alcohol use disorder and medication noncompliance who presented with convulsive status epilepticus in the setting of alcohol use.   Epilepsy with breakthrough seizure Convulsive status epilepticus, resolved Acute encephalopathy due to seizures, resolved -No further seizures overnight.  EEG continues to improve, Clinically patient continues to improve.   Recommendations -On phenobarbital 65 mg 3 times daily, taper ordered in epic -Continue Keppra 1000 mg twice daily - Continue phenytoin 100 mg every 8 hours.   -Discontinue LTM EEG as no further seizures  overnight -CIWA protocol -Continue seizure precautions overnight -Discussed the importance of medication compliance with patient  -As needed IV Ativan 2 mg for clinical seizure-like activity -Management of rest of comorbidities per primary team -Discussed plan with Dr. Jarvis Newcomer -Neurology will sign off.  Please call us for any further questions. -Follow-up with neurology in 3 months  Seizure precautions: Per [facility] statutes, patients with seizures are not allowed to drive until they have been seizure-free for six months and cleared by a physician    Use caution when using heavy equipment or power tools. Avoid working on ladders or at heights. Take showers instead of baths. Ensure the water temperature is not too high on the home water heater. Do not go swimming alone. Do not lock yourself in a room alone (i.e. bathroom). When caring for infants or small children, sit down when holding, feeding, or changing them to minimize risk of injury to the child in the event you have a seizure. Maintain good sleep hygiene. Avoid alcohol.    If patient has another seizure, call 911 and bring them back to the ED if: A.  The seizure lasts longer than 5 minutes.      B.  The patient doesn't wake shortly after the seizure or has new problems such as difficulty seeing, speaking  or moving following the seizure C.  The patient was injured during the seizure D.  The patient has a temperature over 102 F (39C) E.  The patient vomited during the seizure and now is having trouble breathing    During the Seizure   - First, ensure adequate ventilation and place patients on the floor on their left side  Loosen clothing around the neck and ensure the airway is patent. If the patient is clenching the teeth, do not force the mouth open with any object as this can cause severe damage - Remove all items from the surrounding that can be hazardous. The patient may be oblivious to what's happening and may not even  know what he or she is doing. If the patient is confused and wandering, either gently guide him/her away and block access to outside areas - Reassure the individual and be comforting - Call 911. In most cases, the seizure ends before EMS arrives. However, there are cases when seizures may last over 3 to 5 minutes. Or the individual may have developed breathing difficulties or severe injuries. If a pregnant patient or a person with diabetes develops a seizure, it is prudent to call an ambulance. - Finally, if the patient does not regain full consciousness, then call EMS. Most patients will remain confused for about 45 to 90 minutes after a seizure, so you must use judgment in calling for help.    After the Seizure (Postictal Stage)   After a seizure, most patients experience confusion, fatigue, muscle pain and/or a headache. Thus, one should permit the individual to sleep. For the next few days, reassurance is essential. Being calm and helping reorient the person is also of importance.   Most seizures are painless and end spontaneously. Seizures are not harmful to others but can lead to complications such as stress on the lungs, brain and the heart. Individuals with prior lung problems may develop labored breathing and respiratory distress.    I have spent a total of 38 minutes with the patient reviewing hospital notes,  test results, labs and examining the patient as well as establishing an assessment and plan.  > 50% of time was spent in direct patient care.    Lindie Spruce Epilepsy [facility] For questions after 5pm please refer to AMION to reach the Neurologist on call

## 2022-05-20 NOTE — Procedures (Addendum)
Patient Name: [name]  MRN: [id number]  Epilepsy Attending: Charlsie Quest  Referring Physician/Provider: Rejeana Brock, MD  Duration: 05/19/2022 1656 to 05/20/2022 1034   Patient history:  66 year old female who presented in status epilepticus. EEG to evaluate for seizure   Level of alertness: awake, asleep   AEDs during EEG study: LEV, PHT, Phenobarb   Technical aspects: This EEG study was done with scalp electrodes positioned according to the 10-20 International system of electrode placement. Electrical activity was reviewed with band pass filter of 1-70Hz , sensitivity of 7 uV/mm, display speed of 91mm/sec with a 60Hz  notched filter applied as appropriate. EEG data were recorded continuously and digitally stored.  Video monitoring was available and reviewed as appropriate.   Description: During awake state, no clear posterior dominant rhythm was seen.  Sleep was characterized by sleep spindles (12 to 14 Hz), maximal frontocentral region.  EEG showed continuous generalized 3 to 6 Hz theta-delta slowing admixed with 15 to 18 Hz generalized beta activity.  Spikes were noted in right hemisphere, maximal right temporal region.  Sharply contoured 2 to 3 Hz rhythmic delta slowing was also noted in right hemisphere, maximal right temporal lesion.  Hyperventilation and photic stimulation were not performed.     ABNORMALITY  - Spike, right hemisphere, maximal right temporal region - Lateralized rhythmic delta activity, right hemisphere, maximal right temporal region - Continuous slow, generalized   IMPRESSION: This study showed evidence of epileptogenicity and cortical dysfunction arising from right hemisphere, maximal right temporal region. Additionally there is moderate diffuse encephalopathy, nonspecific etiology.  No definite seizures were seen during the study.   Ramey Schiff 

## 2022-05-20 NOTE — Progress Notes (Signed)
Progress Note  Patient: [name] OXB:[id number] DOB: 1955-12-07  DOA: 05/17/2022  DOS: 05/20/2022    Brief hospital course: 66 y.o. female with medical history significant of alcohol abuse, alcohol withdrawal seizure, anxiety/depression, sent from home for evaluation of recurrent seizures.   Patient nonverbal and probably confused at this point postictal, I tried to reach patient significant other and another contact number but both contacts hung up the phone immediately after picking up and never picked up again. So all obtained by review ED record and talking to ED staff.  It appears that patient was found around the evening of this morning by her boyfriend on the floor with seizure-like activity whole body shaking.  EMS alcohol and patient had another episode of seizure in ambulance and was given Ativan and broke.  Patient mentation however did not recover after seizure broke.   ED Course: Afebrile, no Tachycardia no hypotension.  CT head and neck negative for acute findings.  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 Provoked by alcohol use and medication nonadherence. - Can stop cEEG now. - Continue keppra 1,000mg  BID - Continue phenytoin 100mg  q8h - Continue phenobarbital with planned taper over the next few days.  - prn ativan ordered - Continue seizure precautions - D/w Dr. again today. Appreciate epileptology recommendations.  Alcohol abuse and withdrawal:  - Continue CIWA protocol - MVM  Acute metabolic encephalopathy, post-ictal encephalopathy: Much improved. - Delirium precautions - SLP evaluation requested, will start diet today. - OOB. Pt able to participate with therapy, so ordered PT/OT.  Hypokalemia: Refractory to supplementation.  - Supplement enterally, Melynda Ripple x2 today and recheck in AM.   Chronic back pain:  - Continue low dose gabapentin  Hyperglycemia  without diabetes: HbA1c 5.0%.  - Monitor with labs  Depression:  - Continue fluoxetine  Right arm swelling: Due to patient laying on that side, has improved.    Hypoalbuminemia with some third-spacing of IV fluids.  - Taking po, will encourage protein intake and stop IV fluids.  Subjective: More alert today, does say she's hungry. More aware of what's going on.  Objective: Vitals:   05/19/22 2349 05/20/22 0432 05/20/22 0753 05/20/22 1246  BP: 127/80 132/78 134/82 124/83  Pulse: 83 76 81 78  Resp:   18 20  Temp: 98 F (36.7 C) 97.9 F (36.6 C) 98.3 F (36.8 C) 98.3 F (36.8 C)  TempSrc: Axillary Oral Oral Oral  SpO2: 98% 98% 97% 98%  Weight:      Gen: Disheveled female in no distress Pulm: Nonlabored breathing room air. Clear. CV: Regular rate and rhythm. No murmur, rub, or gallop. No JVD, no dependent edema. GI: Abdomen soft, non-tender, non-distended, with normoactive bowel sounds.  Ext: Warm, no deformities Skin: No new rashes, lesions or ulcers on visualized skin. Neuro: Drowsy but rousable and oriented, moves extremities with limited ROM based on pt's limited exertion/cooperation.    Data Personally reviewed: CBC: Recent Labs  Lab 05/17/22 1538 05/17/22 1542 05/20/22 0708  WBC 7.1  --  6.0  NEUTROABS 6.0  --   --   HGB 12.4 13.9 11.7*  HCT 39.0 41.0 35.1*  MCV 102.9*  --  97.2  PLT 171  --  285   Basic Metabolic Panel: Recent Labs  Lab 05/17/22 1538 05/17/22 1542 05/19/22 0248 05/19/22 1028 05/20/22 0708  NA 135 136 137 134* 136  K 3.5 4.4 2.7* 3.1* 2.9*  CL 106 104 106  104 105  CO2 20*  --  22 21* 19*  GLUCOSE 151* 158* 99 86 76  BUN 6* 6* 5* 5* 5*  CREATININE 0.77 0.60 0.59 0.54 0.50  CALCIUM 8.9  --  8.4* 8.1* 8.3*  MG  --   --  1.7  --   --    Albumin 2.8 CK: 83 INR 1.0 HbA1c 5% B12 555 UA: Negative  Family Communication: None at bedside  Disposition: Status is: Inpatient Remains inpatient appropriate because: Continued  antiepileptic Tx/titration, persistent encephalopathy, alcohol withdrawal management Planned Discharge Destination: TBD  Tyrone Nine, MD 05/20/2022 3:07 PM Page by [email or web address]

## 2022-05-20 NOTE — Evaluation (Signed)
Occupational Therapy Evaluation Patient Details Name: [name] MRN: [id number] DOB: Jul 25, 1956 Today's Date: 05/20/2022   History of Present Illness 66 y/o female presented to ED on 05/17/22 after being found down by boyfriend after possible seizure. EEG showing status epilepticus and acute encephalopathy. 2 admissions in 02/2022 and 03/2022 for seizures induced by alcohol. PMH: alcohol abuse, alcohol induced seizures, depression   Clinical Impression   PTA, pt reporting she lived with her significant other in an extended stay hotel and was modified independent in ADL. Pt curretnly requires min guard-max A for functional mobility and ADL. Pt performing functional mobility for short distance with min-mod A. Pt with difficulty following commands and verbally expressing herself at this time. RUE with notable edema that contributed to inability to perform bilateral tasks; RN reporting edema from IV infiltrate. Pt presents with decreased balance, strength, ROM, awareness, attention, safety, and ability to follow commands. Recommending discharge to SNF for continued OT services. Will continue to follow acutely.    Recommendations for follow up therapy are one component of a multi-disciplinary discharge planning process, led by the attending physician.  Recommendations may be updated based on patient status, additional functional criteria and insurance authorization.   Follow Up Recommendations  Skilled nursing-short term rehab (<3 hours/day)    Assistance Recommended at Discharge Frequent or constant Supervision/Assistance  Patient can return home with the following Assistance with cooking/housework;Assist for transportation;Direct supervision/assist for medications management;A lot of help with bathing/dressing/bathroom;A lot of help with walking and/or transfers;Direct supervision/assist for financial management;Help with stairs or ramp for entrance    Functional Status Assessment  Patient has  had a recent decline in their functional status and demonstrates the ability to make significant improvements in function in a reasonable and predictable amount of time.  Equipment Recommendations  Other (comment) (Defer to next venue)    Recommendations for Other Services       Precautions / Restrictions Precautions Precautions: Fall Precaution Comments: EEG Restrictions Weight Bearing Restrictions: No      Mobility Bed Mobility Overal bed mobility: Needs Assistance Bed Mobility: Supine to Sit, Sit to Supine, Rolling Rolling: Mod assist   Supine to sit: Min assist Sit to supine: Min guard   General bed mobility comments: Requiring physical assist to adjust in bed. Pt with mod A for rolling, but believe due to difficulty following commands.    Transfers Overall transfer level: Needs assistance Equipment used: 1 person hand held assist Transfers: Sit to/from Stand Sit to Stand: Min assist           General transfer comment: Min A to stand from low bed surface      Balance Overall balance assessment: Needs assistance Sitting-balance support: Feet supported, No upper extremity supported Sitting balance-Leahy Scale: Fair Sitting balance - Comments: Able to achieve figure 4 position sitting EOB q   Standing balance support: No upper extremity supported, During functional activity Standing balance-Leahy Scale: Poor Standing balance comment: close min guard A                           ADL either performed or assessed with clinical judgement   ADL Overall ADL's : Needs assistance/impaired Eating/Feeding: Minimal assistance;Sitting Eating/Feeding Details (indicate cue type and reason): Min A to locate correct cup. Increased effort to get straw to mouth, exteriencing poor motor planning Grooming: Brushing hair;Min guard;Sitting   Upper Body Bathing: Sitting;Moderate assistance   Lower Body Bathing: Moderate assistance;Sit to/from stand   Upper  Body  Dressing : Sitting;Moderate assistance   Lower Body Dressing: Sit to/from stand;Maximal assistance Lower Body Dressing Details (indicate cue type and reason): Max A to doff/don socks. Pt performing fingure 4 position, however, max difficulty donning/doffing socks. Pt appearing with poor dexterity as well as cognition to carry out desired task Toilet Transfer: Minimal assistance;Stand-pivot;BSC/3in1 (hand held assist; simulated next to bed) Toilet Transfer Details (indicate cue type and reason): hand held assist; simulated next to bed, observed with shuffling gait         Functional mobility during ADLs: Minimal assistance;Moderate assistance (hand held assist) General ADL Comments: Pt with inconsistent ability to motor plan and carry out cognitive thought processes/problem solve during routine ADL.     Vision   Additional Comments: Pt reaching for something on OT arrival, and when OT asks what, pt reporting "i'm delusional". Vision appears to be [facility], however with decreased visual attention. Continue to assess.     Perception     Praxis      Pertinent Vitals/Pain Pain Assessment Pain Assessment: Faces Faces Pain Scale: Hurts even more Pain Location: Hands, knees Pain Descriptors / Indicators: Discomfort Pain Intervention(s): Limited activity within patient's tolerance, Monitored during session, Repositioned     Hand Dominance     Extremity/Trunk Assessment Upper Extremity Assessment Upper Extremity Assessment: Generalized weakness;RUE deficits/detail;LUE deficits/detail RUE Deficits / Details: Swollen from IV infiltrate, 3+/5 strength BUE; very limited rom. Shoulder flexion ~20 degrees; no finger opposition (also difficulty following commands to perform finger opposition. Complaining of pain in BUE RUE Coordination: decreased fine motor;decreased gross motor (Very limited ROM) LUE Deficits / Details: 3+/5 grip strength. Difficulty following commands. Complaining of pain in  BUE LUE Coordination: decreased gross motor;decreased fine motor (Difficulty following commands for finger opposition)   Lower Extremity Assessment Lower Extremity Assessment: Generalized weakness RLE Sensation: history of peripheral neuropathy   Cervical / Trunk Assessment Cervical / Trunk Assessment: Kyphotic   Communication Communication Communication: Expressive difficulties (slow, jumbles speech, and difficulty word finding)   Cognition Arousal/Alertness: Awake/alert Behavior During Therapy: Flat affect Overall Cognitive Status: No family/caregiver present to determine baseline cognitive functioning Area of Impairment: Attention, Memory, Following commands, Safety/judgement, Awareness, Problem solving                 Orientation Level: Disoriented to, Time Current Attention Level: Sustained, Focused (brief periods of sustained attention) Memory: Decreased short-term memory Following Commands: Follows one step commands inconsistently Safety/Judgement: Decreased awareness of safety, Decreased awareness of deficits Awareness: Intellectual Problem Solving: Slow processing, Requires verbal cues, Requires tactile cues, Difficulty sequencing General Comments: Pt requiring max multimodal cues for familiar tasks. Good attempts to initiate (achieving figure 4 position to doff/don sock, but unable to sustain attention/motor plan). Pt with decreased insight into current abilities, and with decreased safety awareness, requiring verbal and tactile cues for safety     General Comments  VSS    Exercises     Shoulder Instructions      Home Living Family/patient expects to be discharged to:: Other (Comment)                                 Additional Comments: Pt has been staying at an extended stay hotel since d/c from SNF with her significant other [name].      Prior Functioning/Environment Prior Level of Function : Needs assist             Mobility Comments:  Reports she has cane at home ADLs Comments: per chart review, boyfriend assists with ADLs/IADLs. Pt reporting she was independent        OT Problem List: Decreased strength;Decreased range of motion;Decreased activity tolerance;Impaired balance (sitting and/or standing);Decreased coordination;Decreased cognition;Decreased safety awareness;Decreased knowledge of use of DME or AE;Impaired UE functional use;Pain      OT Treatment/Interventions: Self-care/ADL training;Therapeutic exercise;Therapeutic activities;DME and/or AE instruction;Visual/perceptual remediation/compensation;Patient/family education;Balance training;Cognitive remediation/compensation    OT Goals(Current goals can be found in the care plan section) Acute Rehab OT Goals Patient Stated Goal: Get better OT Goal Formulation: With patient Time For Goal Achievement: 06/03/22 Potential to Achieve Goals: Good  OT Frequency: Min 2X/week    Co-evaluation              AM-PAC OT "6 Clicks" Daily Activity     Outcome Measure Help from another person eating meals?: A Little Help from another person taking care of personal grooming?: A Little Help from another person toileting, which includes using toliet, bedpan, or urinal?: A Lot Help from another person bathing (including washing, rinsing, drying)?: A Lot Help from another person to put on and taking off regular upper body clothing?: A Lot Help from another person to put on and taking off regular lower body clothing?: A Lot 6 Click Score: 14   End of Session Equipment Utilized During Treatment: Gait belt Nurse Communication: Mobility status  Activity Tolerance: Patient tolerated treatment well Patient left: in bed;with call bell/phone within reach;with bed alarm set;with restraints reapplied ([place])  OT Visit Diagnosis: Unsteadiness on feet (R26.81);Other abnormalities of gait and mobility (R26.89);Muscle weakness (generalized) (M62.81);Other symptoms and signs involving  cognitive function;Other symptoms and signs involving the nervous system (R29.898);Cognitive communication deficit (R41.841)                Time: 6294-7654 OT Time Calculation (min): 30 min Charges:  OT General Charges $OT Visit: 1 Visit OT Evaluation $OT Eval Moderate Complexity: 1 Mod OT Treatments $Self Care/Home Management : 8-22 mins  Ladene Artist, OTR/L [facility] Acute Rehabilitation Office: [phone]   Drue Novel 05/20/2022, 5:32 PM

## 2022-05-20 NOTE — Evaluation (Signed)
Clinical/Bedside Swallow Evaluation Patient Details  Name: [name] MRN: [id number] Date of Birth: [birth year]  Today's Date: 05/20/2022 Time: SLP Start Time (ACUTE ONLY): 0850 SLP Stop Time (ACUTE ONLY): 0915 SLP Time Calculation (min) (ACUTE ONLY): 25 min  Past Medical History:  Past Medical History:  Diagnosis Date   Alcohol dependence (HCC)    Anxiety    Chronic pain    Depression    Diverticulitis    Herniated cervical disc    Pancreatitis    Seizures (HCC)    alcoholic seizures   Past Surgical History:  Past Surgical History:  Procedure Laterality Date   ABDOMINAL HYSTERECTOMY     ANKLE SURGERY Right    CARPAL TUNNEL RELEASE Bilateral    CERVICAL FUSION     CHOLECYSTECTOMY     KNEE SURGERY     HPI:  Patient is a 66 y.o. female with PMH: alcohol abuse, alcohol withdrawal seizure, anxiety/depression who was brought to hospital from home for evaluation of recurrent seizures. She was admitted 6/15-6/20/23 for alcohol withdrawal seizures as well. For current admission, patient was found in morning of 05/17/22 by her boyfriend, on the floor with seizure like activity. EMS arrived and patient had another seizure episode in ambulance on way to hospital and was given Ativan and seizure broke but patient's mentation did not improve. In ED she was afebrile, no tachycardia, no hypotension, CT head and neck were negative for acute findings. She was made NPO until she could be alert enough to be evaluated by SLP for swallow.    Assessment / Plan / Recommendation  Clinical Impression  Patient presenting with what appears to be a mild oral dysphagia caused by cognitive impairment and h/o dental problems. Swallow initiation was timely and patient did not exhibit any overt s/s of aspiration or penetration. She did exhibit mildly delayed mastication and oral transit of dysphagia 2 solids (diced canned peaches) but no oral residuals remained post swallows. Patient able to hold cup and feed  self with liquids but she does not currently have adequate function of her right arm/hand and so she will require assistance with self-feeding. SLP recommending initiate Dys 2 solids, thin liquids diet and plan to f/u to determine if patient able to upgrade  with solids. SLP Visit Diagnosis: Dysphagia, unspecified (R13.10)    Aspiration Risk  No limitations;Mild aspiration risk    Diet Recommendation Thin liquid;Dysphagia 2 (Fine chop)   Liquid Administration via: Cup;Straw Medication Administration: Whole meds with puree Supervision: Full supervision/cueing for compensatory strategies;Staff to assist with self feeding Compensations: Minimize environmental distractions;Slow rate;Small sips/bites Postural Changes: Seated upright at 90 degrees    Other  Recommendations Oral Care Recommendations: Oral care BID;Staff/trained caregiver to provide oral care    Recommendations for follow up therapy are one component of a multi-disciplinary discharge planning process, led by the attending physician.  Recommendations may be updated based on patient status, additional functional criteria and insurance authorization.  Follow up Recommendations No SLP follow up      Assistance Recommended at Discharge Frequent or constant Supervision/Assistance  Functional Status Assessment Patient has had a recent decline in their functional status and demonstrates the ability to make significant improvements in function in a reasonable and predictable amount of time.  Frequency and Duration min 1 x/week  1 week       Prognosis Prognosis for Safe Diet Advancement: Good Barriers to Reach Goals: Time post onset;Other (Comment) Barriers/Prognosis Comment: patient missing some dentition and reports eats soft solids at  baseline because of dental issues      Swallow Study   General Date of Onset: 05/20/22 HPI: Patient is a 66 y.o. female with PMH: alcohol abuse, alcohol withdrawal seizure, anxiety/depression who  was brought to hospital from home for evaluation of recurrent seizures. She was admitted 6/15-6/20/23 for alcohol withdrawal seizures as well. For current admission, patient was found in morning of 05/17/22 by her boyfriend, on the floor with seizure like activity. EMS arrived and patient had another seizure episode in ambulance on way to hospital and was given Ativan and seizure broke but patient's mentation did not improve. In ED she was afebrile, no tachycardia, no hypotension, CT head and neck were negative for acute findings. She was made NPO until she could be alert enough to be evaluated by SLP for swallow. Type of Study: Bedside Swallow Evaluation Previous Swallow Assessment: during recent admission in June 2023 Diet Prior to this Study: NPO Temperature Spikes Noted: No Respiratory Status: Room air History of Recent Intubation: No Behavior/Cognition: Confused;Cooperative;Alert Oral Cavity Assessment: Dry Oral Care Completed by SLP: Yes Oral Cavity - Dentition: Missing dentition Vision: Functional for self-feeding Self-Feeding Abilities: Needs assist Patient Positioning: Upright in bed Baseline Vocal Quality: Normal Volitional Cough: Strong Volitional Swallow: Able to elicit    Oral/Motor/Sensory Function Overall Oral Motor/Sensory Function: Other (comment) (initially appeared with dysarthria but appears to be related to her alertness level as speech was clear after she was adequately alert)   Ice Chips     Thin Liquid Thin Liquid: Within functional limits Presentation: Straw;Self Fed    Nectar Thick     Honey Thick     Puree Puree: Not tested   Solid     Solid: Impaired Oral Phase Functional Implications: Prolonged oral transit;Impaired mastication Other Comments: Dys 2 solids (diced canned peaches) were tolereted without overt s/s aspiration or penetration and only mild delayed mastication and oral transit     Angela Nevin, MA, CCC-SLP Speech Therapy

## 2022-05-20 NOTE — Care Management Important Message (Signed)
Important Message  Patient Details  Name: [name] MRN: [id number] Date of Birth: [birth year]   Medicare Important Message Given:  Yes     Rice Walsh 05/20/2022, 2:06 PM

## 2022-05-20 NOTE — Evaluation (Signed)
Physical Therapy Evaluation Patient Details Name: [name] MRN: [id number] DOB: 06-Feb-1956 Today's Date: 05/20/2022  History of Present Illness  66 y/o female presented to ED on 05/17/22 after being found down by boyfriend after possible seizure. EEG showing status epilepticus and acute encephalopathy. 2 admissions in 02/2022 and 03/2022 for seizures induced by alcohol. PMH: alcohol abuse, alcohol induced seizures, depression  Clinical Impression  Patient admitted with the above. PTA, patient lives in extended stay hotel with boyfriend and per her report, she was independent with no AD. Patient currently presents with impaired cognition, weakness, impaired balance, and decreased activity tolerance. Patient required min-modA for short distance ambulation with use of HHAx1 with patient attempting to reach for objects in room despite hand in therapist's hand. Requires cues for sequencing and processing tasks. Patient will benefit from skilled PT services during acute stay to address listed deficits. Recommend SNF at discharge to maximize functional independence and safety. If cognition and mobility improves, may be able to discharge home with boyfriend but will continue to assess.      Recommendations for follow up therapy are one component of a multi-disciplinary discharge planning process, led by the attending physician.  Recommendations may be updated based on patient status, additional functional criteria and insurance authorization.  Follow Up Recommendations Skilled nursing-short term rehab (<3 hours/day) Can patient physically be transported by private vehicle: Yes    Assistance Recommended at Discharge Frequent or constant Supervision/Assistance  Patient can return home with the following  A lot of help with walking and/or transfers;A little help with bathing/dressing/bathroom;Assistance with cooking/housework;Direct supervision/assist for medications management;Direct supervision/assist  for financial management;Assist for transportation;Help with stairs or ramp for entrance    Equipment Recommendations Rolling Dim Meisinger (2 wheels)  Recommendations for Other Services       Functional Status Assessment Patient has had a recent decline in their functional status and demonstrates the ability to make significant improvements in function in a reasonable and predictable amount of time.     Precautions / Restrictions Precautions Precautions: Fall Restrictions Weight Bearing Restrictions: No      Mobility  Bed Mobility Overal bed mobility: Needs Assistance Bed Mobility: Supine to Sit, Sit to Supine     Supine to sit: Min assist Sit to supine: Min guard   General bed mobility comments: assist for LE management towards EOB. Able to return to supine without physical assist but needed assist to reposition higher in bed    Transfers Overall transfer level: Needs assistance Equipment used: 1 person hand held assist Transfers: Sit to/from Stand Sit to Stand: Mod assist           General transfer comment: modA to stand from low bed surface and toilet    Ambulation/Gait Ambulation/Gait assistance: Min assist, Mod assist Gait Distance (Feet): 10 Feet (+10) Assistive device: 1 person hand held assist Gait Pattern/deviations: Step-to pattern, Decreased stride length, Shuffle, Wide base of support Gait velocity: decreased     General Gait Details: patient with short shuffling steps to/from bathroom. Varied between min-modA for balance. HHA provided but patient continued to reach for objects despite therapist's hand in patient's hand. Cues for sequencing and directions in room  Stairs            Wheelchair Mobility    Modified Rankin (Stroke Patients Only)       Balance Overall balance assessment: Needs assistance Sitting-balance support: Feet supported, No upper extremity supported Sitting balance-Leahy Scale: Fair     Standing balance support: No  upper  extremity supported, During functional activity Standing balance-Leahy Scale: Poor                               Pertinent Vitals/Pain Pain Assessment Pain Assessment: Faces Faces Pain Scale: Hurts little more Pain Location: R hand, R knee, ear Pain Descriptors / Indicators: Discomfort Pain Intervention(s): Monitored during session, Repositioned    Home Living Family/patient expects to be discharged to:: Other (Comment)                   Additional Comments: Pt has been staying at an extended stay hotel since d/c from SNF with her significant other [name].    Prior Function Prior Level of Function : Needs assist             Mobility Comments: reports no AD use. Per chart review,patient uses a cane or boyfriend provides HHA ADLs Comments: per chart review, boyfriend assists with ADLs/iADLs     Hand Dominance        Extremity/Trunk Assessment   Upper Extremity Assessment Upper Extremity Assessment: Defer to OT evaluation    Lower Extremity Assessment Lower Extremity Assessment: Generalized weakness RLE Sensation: history of peripheral neuropathy    Cervical / Trunk Assessment Cervical / Trunk Assessment: Kyphotic  Communication   Communication: Expressive difficulties (slow, jumbled speech at times)  Cognition Arousal/Alertness: Awake/alert Behavior During Therapy: Flat affect Overall Cognitive Status: Impaired/Different from baseline Area of Impairment: Orientation, Attention, Memory, Following commands, Safety/judgement, Awareness, Problem solving                 Orientation Level: Disoriented to, Time Current Attention Level: Sustained Memory: Decreased short-term memory Following Commands: Follows one step commands inconsistently Safety/Judgement: Decreased awareness of safety, Decreased awareness of deficits Awareness: Intellectual Problem Solving: Slow processing, Requires verbal cues, Requires tactile cues, Difficulty  sequencing General Comments: States "May" and "1964" for time. Able to answer other orientation questions correctly with increased time. Requires max multimodal cueing for simple familiar tasks. Decreased safety awareness throughout        General Comments      Exercises     Assessment/Plan    PT Assessment Patient needs continued PT services  PT Problem List Decreased strength;Decreased activity tolerance;Decreased balance;Decreased mobility;Decreased coordination;Decreased cognition;Decreased knowledge of use of DME;Decreased safety awareness;Decreased knowledge of precautions;Pain;Impaired sensation       PT Treatment Interventions DME instruction;Gait training;Functional mobility training;Therapeutic activities;Therapeutic exercise;Balance training;Neuromuscular re-education;Cognitive remediation;Patient/family education    PT Goals (Current goals can be found in the Care Plan section)  Acute Rehab PT Goals Patient Stated Goal: did not state PT Goal Formulation: Patient unable to participate in goal setting Time For Goal Achievement: 06/03/22 Potential to Achieve Goals: Fair    Frequency Min 3X/week     Co-evaluation               AM-PAC PT "6 Clicks" Mobility  Outcome Measure Help needed turning from your back to your side while in a flat bed without using bedrails?: A Little Help needed moving from lying on your back to sitting on the side of a flat bed without using bedrails?: A Little Help needed moving to and from a bed to a chair (including a wheelchair)?: A Little Help needed standing up from a chair using your arms (e.g., wheelchair or bedside chair)?: A Lot Help needed to walk in hospital room?: A Lot Help needed climbing 3-5 steps with a railing? : A  Lot 6 Click Score: 15    End of Session Equipment Utilized During Treatment: Gait belt Activity Tolerance: Patient tolerated treatment well Patient left: in bed;with call bell/phone within reach;with bed  alarm set;Other (comment) (OT present) Nurse Communication: Mobility status PT Visit Diagnosis: Unsteadiness on feet (R26.81);Difficulty in walking, not elsewhere classified (R26.2);Muscle weakness (generalized) (M62.81)    Time: 9323-5573 PT Time Calculation (min) (ACUTE ONLY): 20 min   Charges:   PT Evaluation $PT Eval Moderate Complexity: 1 Mod          Milady Fleener A. Dan Humphreys PT, DPT Acute Rehabilitation Services Office [phone]   Viviann Spare 05/20/2022, 5:04 PM

## 2022-05-21 DIAGNOSIS — G40901 Epilepsy, unspecified, not intractable, with status epilepticus: Secondary | ICD-10-CM | POA: Diagnosis not present

## 2022-05-21 DIAGNOSIS — F1020 Alcohol dependence, uncomplicated: Secondary | ICD-10-CM | POA: Diagnosis not present

## 2022-05-21 DIAGNOSIS — R569 Unspecified convulsions: Secondary | ICD-10-CM | POA: Diagnosis not present

## 2022-05-21 LAB — BASIC METABOLIC PANEL
Anion gap: 8 (ref 5–15)
BUN: <5 mg/dL — ABNORMAL LOW (ref 8–23)
CO2: 24 mmol/L (ref 22–32)
Calcium: 8.4 mg/dL — ABNORMAL LOW (ref 8.9–10.3)
Chloride: 104 mmol/L (ref 98–111)
Creatinine, Ser: 0.48 mg/dL (ref 0.44–1.00)
GFR, Estimated: >60 mL/min (ref >60)
Glucose, Bld: 98 mg/dL (ref 70–99)
Potassium: 3 mmol/L — ABNORMAL LOW (ref 3.5–5.1)
Sodium: 136 mmol/L (ref 135–145)

## 2022-05-21 LAB — MAGNESIUM: Magnesium: 1.6 mg/dL — ABNORMAL LOW (ref 1.7–2.4)

## 2022-05-21 MED ORDER — MAGNESIUM SULFATE 2 GM/50ML IV SOLN
2.0000 g | Freq: Once | INTRAVENOUS | Status: AC
  Administered 2022-05-21: 2 g via INTRAVENOUS
  Filled 2022-05-21: qty 50

## 2022-05-21 MED ORDER — POTASSIUM CHLORIDE CRYS ER 20 MEQ PO TBCR
40.0000 meq | EXTENDED_RELEASE_TABLET | Freq: Three times a day (TID) | ORAL | Status: AC
  Administered 2022-05-21 (×3): 40 meq via ORAL
  Filled 2022-05-21 (×3): qty 2

## 2022-05-21 NOTE — Progress Notes (Signed)
Progress Note  Patient: [name] JOA:[id number] DOB: Sep 27, 1956  DOA: 05/17/2022  DOS: 05/21/2022    Brief hospital course: 66 y.o. female with medical history significant of alcohol abuse, alcohol withdrawal seizure, anxiety/depression, sent from home for evaluation of recurrent seizures.   Patient nonverbal and probably confused at this point postictal, I tried to reach patient significant other and another contact number but both contacts hung up the phone immediately after picking up and never picked up again. So all obtained by review ED record and talking to ED staff.  It appears that patient was found around the evening of this morning by her boyfriend on the floor with seizure-like activity whole body shaking.  EMS alcohol and patient had another episode of seizure in ambulance and was given Ativan and broke.  Patient mentation however did not recover after seizure broke.   ED Course: Afebrile, no Tachycardia no hypotension.  CT head and neck negative for acute findings.  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 Provoked by alcohol use and medication nonadherence.  - Continue keppra 1,000mg  BID - Continue phenytoin 100mg  q8h - Continue phenobarbital with planned taper over the next few days. ?if this is depressing mental status still.  - prn ativan ordered - Continue seizure precautions - Neurology signed off 8/2.  Alcohol abuse and withdrawal:  - Evidence of withdrawal is resolved at this time. - MVM  Acute metabolic encephalopathy, post-ictal encephalopathy: Improved but not at baseline, not safe to discharge at this time.  - Delirium precautions - SLP evaluated, started diet - OOB. PT/OT to continue, currently recommending SNF, d/w TOC.  Hypokalemia: Refractory to supplementation.  - Supplement enterally, x3 today. Monitor with Mg in AM  Chronic back pain:  -  Continue low dose gabapentin  Hyperglycemia without diabetes: HbA1c 5.0%.  - Monitor with labs  Depression:  - Continue fluoxetine  Right arm swelling: Due to patient laying on that side initially, though continues, also swelling on left concerning for element of anasarca.  - Check venous U/S.  Hypoalbuminemia with some third-spacing of IV fluids.  - Taking po, will encourage protein intake. Stopped IVF  Subjective: Eating her breakfast this morning, says her hands are swollen making them hard to move, she's right handed. No seizure activity reported in past 24 hours.  Objective: Vitals:   05/20/22 2358 05/21/22 0417 05/21/22 0839 05/21/22 1141  BP: 102/60 106/65 125/76 120/75  Pulse: 77 72 70 87  Resp: 16 16  18   Temp: 98 F (36.7 C) 98.2 F (36.8 C)    TempSrc: Oral Oral    SpO2: 97% 97% 97% 100%  Weight:      Gen: 66 y.o. female in no distress, chronically ill-appearing, more alert and interactive. Pulm: Nonlabored breathing room air. Clear. CV: Regular rate and rhythm. No murmur, rub, or gallop. No JVD, Has significant pitting edema to arms >> legs bilaterally. Pulses intact, cap refill brisk. GI: Abdomen soft, non-tender, non-distended, with normoactive bowel sounds.  Ext: Warm, no deformities Skin: No new rashes, lesions or ulcers on visualized skin. Neuro: Alert, incompletely oriented this morning. Moves all extremities, albeit slowly. No focal sensory deficits noted. Psych: Judgement and insight appear impaired. Calm  Data Personally reviewed: CBC: Recent Labs  Lab 05/17/22 1538 05/17/22 1542 05/20/22 0708  WBC 7.1  --  6.0  NEUTROABS 6.0  --   --   HGB 12.4 13.9 11.7*  HCT 39.0 41.0 35.1*  MCV 102.9*  --  97.2  PLT 171  --  285   Basic Metabolic Panel: Recent Labs  Lab 05/17/22 1538 05/17/22 1542 05/19/22 0248 05/19/22 1028 05/20/22 0708 05/21/22 0605  NA 135 136 137 134* 136 136  K 3.5 4.4 2.7* 3.1* 2.9* 3.0*  CL 106 104 106 104 105 104  CO2  20*  --  22 21* 19* 24  GLUCOSE 151* 158* 99 86 76 98  BUN 6* 6* 5* 5* 5* <5*  CREATININE 0.77 0.60 0.59 0.54 0.50 0.48  CALCIUM 8.9  --  8.4* 8.1* 8.3* 8.4*  MG  --   --  1.7  --   --  1.6*   Albumin 2.8 CK: 83 INR 1.0 HbA1c 5% B12 555 UA: Negative  cEEG 8/2:  - Spike, right hemisphere, maximal right temporal region - Lateralized rhythmic delta activity, right hemisphere, maximal right temporal region - Continuous slow, generalized - This study showed evidence of epileptogenicity and cortical dysfunction arising from right hemisphere, maximal right temporal region. Additionally there is moderate diffuse encephalopathy, nonspecific etiology.  No definite seizures were seen during the study.  Family Communication: None at bedside  Disposition: Status is: Inpatient Remains inpatient appropriate because: Continued antiepileptic Tx/titration, persistent encephalopathy, alcohol withdrawal management Planned Discharge Destination: TBD  Tyrone Nine, MD 05/21/2022 11:55 AM Page by [email or web address]

## 2022-05-21 NOTE — Progress Notes (Signed)
Speech Language Pathology Treatment: Dysphagia  Patient Details Name: [name] MRN: [id number] DOB: 12/20/1955 Today's Date: 05/21/2022 Time: 0900-0919 SLP Time Calculation (min) (ACUTE ONLY): 19 min  Assessment / Plan / Recommendation Clinical Impression  Pt was seen for dysphagia treatment and was cooperative throughout the session. Pt was seen during breakfast which included scrambled eggs, ground sausage, oatmeal, and thin liquids. Pt, and nursing reported that the pt has been tolerating the current diet without signs of aspiration. Pt tolerated all solids and thin liquids via cup and straw using individual and consecutive swallows without overt s/sx of aspiration. Mastication and oral clearance were WFL and no symptoms of pharyngeal residue were noted. Pt stated that she has been enjoying the dysphagia 2 diet since her molars have been "falling out" and this has made mastication of more advanced solids difficult. The possibility of advancing her diet to maximize her options was discussed but, as pt has stated to previous SLPs in the past, she stated that she would like to maintain her current diet due to her reduced dentition. It is recommended that the current diet be continued. Further skilled SLP services are not clinically indicated at this time.    HPI HPI: Patient is a 65 y.o. female with PMH: alcohol abuse, alcohol withdrawal seizure, anxiety/depression who was brought to hospital from home for evaluation of recurrent seizures. She was admitted 6/15-6/20/23 for alcohol withdrawal seizures as well. For current admission, patient was found in morning of 05/17/22 by her boyfriend, on the floor with seizure like activity. EMS arrived and patient had another seizure episode in ambulance on way to hospital and was given Ativan and seizure broke but patient's mentation did not improve. In ED she was afebrile, no tachycardia, no hypotension, CT head and neck were negative for acute findings. She  was made NPO until she could be alert enough to be evaluated by SLP for swallow.      SLP Plan  All goals met;Discharge SLP treatment due to (comment)      Recommendations for follow up therapy are one component of a multi-disciplinary discharge planning process, led by the attending physician.  Recommendations may be updated based on patient status, additional functional criteria and insurance authorization.    Recommendations  Diet recommendations: Dysphagia 2 (fine chop);Thin liquid Liquids provided via: Cup;Straw Medication Administration: Whole meds with puree Supervision: Staff to assist with self feeding;Trained caregiver to feed patient Compensations: Minimize environmental distractions;Slow rate Postural Changes and/or Swallow Maneuvers: Seated upright 90 degrees                Oral Care Recommendations: Oral care BID;Staff/trained caregiver to provide oral care Follow Up Recommendations: No SLP follow up SLP Visit Diagnosis: Dysphagia, unspecified (R13.10) Plan: All goals met;Discharge SLP treatment due to (comment)          I. , MS, CCC-SLP Acute Rehabilitation Services Office number [phone]    I   05/21/2022, 9:20 AM     

## 2022-05-21 NOTE — TOC CAGE-AID Note (Signed)
Transition of Care [facility]) - CAGE-AID Screening   Patient Details  Name: [name] MRN: [id number] Date of Birth: [birth year]  Transition of [facility]) CM/SW Contact:    Kermit Balo, RN Phone Number: 05/21/2022, 12:03 PM   Clinical Narrative: Resources provided for inpatient/ outpatient alcohol counseling.   CAGE-AID Screening:    Have You Ever Felt You Ought to Cut Down on Your Drinking or Drug Use?: Yes Have People Annoyed You By Critizing Your Drinking Or Drug Use?: No Have You Felt Bad Or Guilty About Your Drinking Or Drug Use?: Yes Have You Ever Had a Drink or Used Drugs First Thing In The Morning to Steady Your Nerves or to Get Rid of a Hangover?: Yes CAGE-AID Score: 3  Substance Abuse Education Offered: Yes (pt considering resources but wont commit)  Substance abuse interventions: Patient Counseling, Transport planner

## 2022-05-21 NOTE — NC FL2 (Signed)
[place] MEDICAID FL2 LEVEL OF CARE SCREENING TOOL     IDENTIFICATION  Patient Name: [name] Birthdate: 21-Sep-1956 Sex: female Admission Date (Current Location): 05/17/2022  [facility] and [place] Number:  Producer, television/film/video and Address:  [facility], [address], [place], [place] [address]      Provider Number: [id number]  Attending Physician Name and Address:  Tyrone Nine, MD  Relative Name and Phone Number:       Current Level of Care: Hospital Recommended Level of Care:   Prior Approval Number:    Date Approved/Denied:   PASRR Number: [id number]  Discharge Plan: SNF    Current Diagnoses: Patient Active Problem List   Diagnosis Date Noted   Chronic pain    Hypoalbuminemia    Hyperglycemia    Nonadherence to medication    Seizure (HCC) 05/17/2022   Status epilepticus (HCC)    Hypokalemia    Alcohol-induced mood disorder (HCC)    Closed displaced oblique fracture of shaft of left humerus 08/01/2020   GERD (gastroesophageal reflux disease) 02/28/2020   Alcohol withdrawal syndrome, with delirium (HCC) 12/13/2018   Thrombocytopenia (HCC) 12/13/2018   Neuropathy 12/13/2018   Subarachnoid hemorrhage (HCC) 12/11/2018   Seizure due to alcohol withdrawal (HCC) 12/11/2018   Alcohol dependence with alcohol-induced mood disorder (HCC)    Major depressive disorder, 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   Alcohol use disorder, severe, dependence (HCC) 05/16/2018    Orientation RESPIRATION BLADDER Height & Weight     Self  Normal Incontinent Weight: 58.9 kg Height:     BEHAVIORAL SYMPTOMS/MOOD NEUROLOGICAL BOWEL NUTRITION STATUS    Convulsions/Seizures Incontinent Diet (dysphagia 2 with thin liquids)  AMBULATORY STATUS COMMUNICATION OF NEEDS Skin   Extensive Assist Verbally Bruising                       Personal Care Assistance Level of Assistance  Bathing, Feeding, Dressing Bathing Assistance:  Limited assistance Feeding assistance: Limited assistance Dressing Assistance: Limited assistance     Functional Limitations Info  Sight, Hearing, Speech Sight Info: Impaired Hearing Info: Adequate Speech Info: Impaired (delayed)    SPECIAL CARE FACTORS FREQUENCY  PT (By licensed PT), OT (By licensed OT)     PT Frequency: 5x/wk OT Frequency: 5x/wk            Contractures Contractures Info: Not present    Additional Factors Info  Code Status, Allergies, Psychotropic Code Status Info: Full Allergies Info: Keflex (Cephalexin)   Cephalosporins   Prednisone   Toradol (Ketorolac Tromethamine)   Ultram (Tramadol) Psychotropic Info: Prozac 20 mg daily/ Neurontin 100 mg TID/ Phenobarb and dilantin for seizures         Current Medications (05/21/2022):  This is the current hospital active medication list Current Facility-Administered Medications  Medication Dose Route Frequency Provider Last Rate Last Admin   acetaminophen (TYLENOL) tablet 650 mg  650 mg Oral Q4H PRN Tyrone Nine, MD   650 mg at 05/21/22 0544   enoxaparin (LOVENOX) injection 40 mg  40 mg Subcutaneous Q24H Mikey College T, MD   40 mg at 05/20/22 2251   FLUoxetine (PROZAC) capsule 20 mg  20 mg Oral Daily Mikey College T, MD   20 mg at 05/21/22 1057   gabapentin (NEURONTIN) capsule 100 mg  100 mg Oral TID Mikey College T, MD   100 mg at 05/21/22 1057   levETIRAcetam (KEPPRA) IVPB 1000 mg/100 mL premix  1,000  mg Intravenous Q12H Rejeana Brock, MD 400 mL/hr at 05/21/22 1058 1,000 mg at 05/21/22 1058   LORazepam (ATIVAN) injection 4 mg  4 mg Intravenous Q5 Min x 2 PRN Mikey College T, MD   4 mg at 05/17/22 2056   multivitamin with minerals tablet 1 tablet  1 tablet Oral Daily Mikey College T, MD   1 tablet at 05/21/22 1057   Oral care mouth rinse  15 mL Mouth Rinse PRN Mikey College T, MD       PHENObarbital (LUMINAL) injection 65 mg  65 mg Intravenous BID Charlsie Quest, MD       Followed by   Melene Muller ON 05/23/2022]  PHENObarbital (LUMINAL) injection 65 mg  65 mg Intravenous Q1200 Charlsie Quest, MD       phenytoin (DILANTIN) injection 100 mg  100 mg Intravenous Q8H Rejeana Brock, MD   100 mg at 05/21/22 3893   potassium chloride SA (KLOR-CON M) CR tablet 40 mEq  40 mEq Oral TID Tyrone Nine, MD   40 mEq at 05/21/22 1057   thiamine (VITAMIN B1) tablet 100 mg  100 mg Oral Daily Mikey College T, MD   100 mg at 05/20/22 1019   Or   thiamine (VITAMIN B1) injection 100 mg  100 mg Intravenous Daily Mikey College T, MD   100 mg at 05/21/22 1058     Discharge Medications: Please see discharge summary for a list of discharge medications.  Relevant Imaging Results:  Relevant Lab Results:   Additional Information SSN: [id number]  Kermit Balo, RN

## 2022-05-21 NOTE — Progress Notes (Signed)
Physical Therapy Treatment Patient Details Name: [name] MRN: [id number] DOB: 1955/11/15 Today's Date: 05/21/2022   History of Present Illness 66 y/o female presented to ED on 05/17/22 after being found down by boyfriend after possible seizure. EEG showing status epilepticus and acute encephalopathy. 2 admissions in 02/2022 and 03/2022 for seizures induced by alcohol. PMH: alcohol abuse, alcohol induced seizures, depression    PT Comments    Pt sleeping upon arrival, wakes easily. Pt demonstrating mobility progression, ambulating in hallway with use of RW and max multimodal cuing for safety. Pt overall requiring light physical assist to steady and maneuver in room/hallway. Cognition remains impaired, as pt is tangential and has very poor safety awareness at this time. PT to continue to follow.     Recommendations for follow up therapy are one component of a multi-disciplinary discharge planning process, led by the attending physician.  Recommendations may be updated based on patient status, additional functional criteria and insurance authorization.  Follow Up Recommendations  Skilled nursing-short term rehab (<3 hours/day) Can patient physically be transported by private vehicle: Yes   Assistance Recommended at Discharge Frequent or constant Supervision/Assistance  Patient can return home with the following A little help with bathing/dressing/bathroom;Assistance with cooking/housework;Direct supervision/assist for medications management;Direct supervision/assist for financial management;Assist for transportation;Help with stairs or ramp for entrance;A little help with walking and/or transfers   Equipment Recommendations  Rolling walker (2 wheels)    Recommendations for Other Services       Precautions / Restrictions Precautions Precautions: Fall Restrictions Weight Bearing Restrictions: No     Mobility  Bed Mobility Overal bed mobility: Needs Assistance Bed Mobility: Supine  to Sit, Sit to Supine     Supine to sit: Min assist Sit to supine: Min guard   General bed mobility comments: assist for trunk elevation only, increased time and effort    Transfers Overall transfer level: Needs assistance Equipment used: 1 person hand held assist Transfers: Sit to/from Stand, Bed to chair/wheelchair/BSC Sit to Stand: Min assist   Step pivot transfers: Min assist       General transfer comment: assist for steadying, guiding pt to [facility] STS x2, from EOB and BSC.    Ambulation/Gait Ambulation/Gait assistance: Min assist Gait Distance (Feet): 100 Feet Assistive device: Rolling walker (2 wheels) Gait Pattern/deviations: Decreased stride length, Shuffle, Wide base of support, Step-through pattern, Staggering left Gait velocity: decr     General Gait Details: assist to steady, physically maneuver RW. Cues for staying in middle of hallway, often lists L towards railing   Stairs             Wheelchair Mobility    Modified Rankin (Stroke Patients Only)       Balance Overall balance assessment: Needs assistance Sitting-balance support: Feet supported, No upper extremity supported Sitting balance-Leahy Scale: Fair     Standing balance support: No upper extremity supported, During functional activity Standing balance-Leahy Scale: Poor Standing balance comment: close min guard A                            Cognition Arousal/Alertness: Awake/alert Behavior During Therapy: Flat affect Overall Cognitive Status: No family/caregiver present to determine baseline cognitive functioning Area of Impairment: Attention, Memory, Following commands, Safety/judgement, Awareness, Problem solving, Orientation                 Orientation Level: Disoriented to, Time Current Attention Level: Sustained, Focused (brief periods of sustained attention) Memory: Decreased short-term  memory Following Commands: Follows one step commands  inconsistently Safety/Judgement: Decreased awareness of safety, Decreased awareness of deficits Awareness: Intellectual Problem Solving: Slow processing, Requires verbal cues, Requires tactile cues, Difficulty sequencing General Comments: Pt thinks the day of the week is Sunday, otherwise oriented. Pt tangential throughout session, for example stating "I don't think Barbie is actually real" while transferring to [facility]. Pt requires max cuing throughout mobility for safety        Exercises      General Comments General comments (skin integrity, edema, etc.): RUE especially distally is edematous with identifier bracelet digging into pt skin upon PT arrival. PT cut off bracelet, elevated arm, and notified RN      Pertinent Vitals/Pain Pain Assessment Pain Assessment: Faces Faces Pain Scale: Hurts little more Pain Location: R hand and forearm, swelling noted (RN aware) Pain Descriptors / Indicators: Discomfort, Grimacing Pain Intervention(s): Limited activity within patient's tolerance, Monitored during session, Repositioned    Home Living                          Prior Function            PT Goals (current goals can now be found in the care plan section) Acute Rehab PT Goals Patient Stated Goal: did not state PT Goal Formulation: Patient unable to participate in goal setting Time For Goal Achievement: 06/03/22 Potential to Achieve Goals: Fair Progress towards PT goals: Progressing toward goals    Frequency    Min 3X/week      PT Plan Current plan remains appropriate    Co-evaluation              AM-PAC PT "6 Clicks" Mobility   Outcome Measure  Help needed turning from your back to your side while in a flat bed without using bedrails?: A Little Help needed moving from lying on your back to sitting on the side of a flat bed without using bedrails?: A Little Help needed moving to and from a bed to a chair (including a wheelchair)?: A Little Help needed  standing up from a chair using your arms (e.g., wheelchair or bedside chair)?: A Little Help needed to walk in hospital room?: A Little Help needed climbing 3-5 steps with a railing? : A Lot 6 Click Score: 17    End of Session   Activity Tolerance: Patient tolerated treatment well Patient left: in bed;with call bell/phone within reach;with bed alarm set Nurse Communication: Mobility status PT Visit Diagnosis: Unsteadiness on feet (R26.81);Difficulty in walking, not elsewhere classified (R26.2);Muscle weakness (generalized) (M62.81)     Time: 2355-7322 PT Time Calculation (min) (ACUTE ONLY): 23 min  Charges:  $Therapeutic Activity: 8-22 mins                     Marye Round, PT DPT Acute Rehabilitation Services Pager [phone]  Office [phone]    Truddie Coco 05/21/2022, 4:46 PM

## 2022-05-21 NOTE — TOC Initial Note (Signed)
Transition of Care [facility]) - Initial/Assessment Note    Patient Details  Name: [name] MRN: [id number] Date of Birth: 10/11/1956  Transition [facility]) CM/SW Contact:    Kermit Balo, RN Phone Number: 05/21/2022, 11:57 AM  Clinical Narrative:                 Patient stays in a motel with her significant other. She states he takes care of her but has his own issues. CM inquired about SNF rehab and she states she needs rehab but CM informed her that last time she left after 2 days in SNF rehab and she could not do that again. She states she people were "weird" at the rehab. CM told her they were there to get he better and stronger and that if she goes she has to stay until they feel she is ready for d/c. Currently she is agreeable.  CM inquired about alcohol rehab if she does well enough in next couple of days to not need physical rehab. She would not answer whether she would go to alcohol rehab. CM asked her to consider this option. Pt is asking for a case worker to come to the motel to see her and make sure she is doing what she is supposed to do. She states she has had one in the past. CM will look back to see who this may have been through but currently this is not an option. Will fax pt out for SNF rehab to see if she will get any bed offers.   Expected Discharge Plan: Skilled Nursing Facility Barriers to Discharge: Continued Medical Work up   Patient Goals and CMS Choice   CMS Medicare.gov Compare Post Acute Care list provided to:: Patient Choice offered to / list presented to : Patient  Expected Discharge Plan and Services Expected Discharge Plan: Skilled Nursing Facility     Post Acute Care Choice: Skilled Nursing Facility Living arrangements for the past 2 months: Hotel/Motel                                      Prior Living Arrangements/Services Living arrangements for the past 2 months: Hotel/Motel Lives with:: Significant Other   Do you feel safe  going back to the place where you live?: Yes               Activities of Daily Living      Permission Sought/Granted                  Emotional Assessment   Attitude/Demeanor/Rapport: Engaged Affect (typically observed): In denial Orientation: : Oriented to Self, Oriented to Place, Oriented to Situation Alcohol / Substance Use: Alcohol Use Psych Involvement: No (comment)  Admission diagnosis:  Seizure [facility]) [R56.9] Patient Active Problem List   Diagnosis Date Noted   Chronic pain    Hypoalbuminemia    Hyperglycemia    Nonadherence to medication    Seizure (HCC) 05/17/2022   Status epilepticus (HCC)    Hypokalemia    Alcohol-induced mood disorder (HCC)    Closed displaced oblique fracture of shaft of left humerus 08/01/2020   GERD (gastroesophageal reflux disease) 02/28/2020   Alcohol withdrawal syndrome, with delirium (HCC) 12/13/2018   Thrombocytopenia (HCC) 12/13/2018   Neuropathy 12/13/2018   Subarachnoid hemorrhage (HCC) 12/11/2018   Seizure due to alcohol withdrawal (HCC) 12/11/2018   Alcohol dependence with alcohol-induced mood disorder (HCC)  Major depressive disorder, 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 [place] [address] Phone: [phone] Fax: [phone]  [facility] [address] [place] [address] Phone: [phone] Fax: [phone]     Social Determinants of Health (SDOH) Interventions    Readmission Risk Interventions     No data to display

## 2022-05-22 ENCOUNTER — Inpatient Hospital Stay (HOSPITAL_COMMUNITY): Payer: 59

## 2022-05-22 DIAGNOSIS — F1020 Alcohol dependence, uncomplicated: Secondary | ICD-10-CM | POA: Diagnosis not present

## 2022-05-22 DIAGNOSIS — G40901 Epilepsy, unspecified, not intractable, with status epilepticus: Secondary | ICD-10-CM | POA: Diagnosis not present

## 2022-05-22 DIAGNOSIS — M7989 Other specified soft tissue disorders: Secondary | ICD-10-CM

## 2022-05-22 DIAGNOSIS — R569 Unspecified convulsions: Secondary | ICD-10-CM | POA: Diagnosis not present

## 2022-05-22 LAB — COMPREHENSIVE METABOLIC PANEL
ALT: 13 U/L (ref 0–44)
AST: 26 U/L (ref 15–41)
Albumin: 2.7 g/dL — ABNORMAL LOW (ref 3.5–5.0)
Alkaline Phosphatase: 50 U/L (ref 38–126)
Anion gap: 9 (ref 5–15)
BUN: <5 mg/dL — ABNORMAL LOW (ref 8–23)
CO2: 21 mmol/L — ABNORMAL LOW (ref 22–32)
Calcium: 8.6 mg/dL — ABNORMAL LOW (ref 8.9–10.3)
Chloride: 104 mmol/L (ref 98–111)
Creatinine, Ser: 0.54 mg/dL (ref 0.44–1.00)
GFR, Estimated: >60 mL/min (ref >60)
Glucose, Bld: 114 mg/dL — ABNORMAL HIGH (ref 70–99)
Potassium: 4.1 mmol/L (ref 3.5–5.1)
Sodium: 134 mmol/L — ABNORMAL LOW (ref 135–145)
Total Bilirubin: 0.6 mg/dL (ref 0.3–1.2)
Total Protein: 5.9 g/dL — ABNORMAL LOW (ref 6.5–8.1)

## 2022-05-22 LAB — MAGNESIUM: Magnesium: 1.8 mg/dL (ref 1.7–2.4)
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
## 2022-05-22 NOTE — Progress Notes (Signed)
Progress Note  Patient: [name] LXB:[id number] DOB: 03-24-1956  DOA: 05/17/2022  DOS: 05/22/2022    Brief hospital course: 66 y.o. female with medical history significant of alcohol abuse, alcohol withdrawal seizure, anxiety/depression, sent from home for evaluation of recurrent seizures.   Patient nonverbal and probably confused at this point postictal, I tried to reach patient significant other and another contact number but both contacts hung up the phone immediately after picking up and never picked up again. So all obtained by review ED record and talking to ED staff.  It appears that patient was found around the evening of this morning by her boyfriend on the floor with seizure-like activity whole body shaking.  EMS alcohol and patient had another episode of seizure in ambulance and was given Ativan and broke.  Patient mentation however did not recover after seizure broke.   ED Course: Afebrile, no Tachycardia no hypotension.  CT head and neck negative for acute findings.  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 Provoked by alcohol use and medication nonadherence.  - Continue keppra 1,000mg  BID - Continue phenytoin 100mg  q8h - Continue phenobarbital with planned taper as ordered. Last dose scheduled 8/6. - prn ativan ordered - Continue seizure precautions - Neurology signed off 8/2.  Alcohol abuse and withdrawal:  - Evidence of withdrawal is resolved at this time. - MVM  Acute metabolic encephalopathy, post-ictal encephalopathy: Improved but not at baseline, not safe to discharge at this time.  - Delirium precautions - SLP evaluated, started diet - OOB. PT/OT to continue, currently recommending SNF, d/w TOC. Pt not currently amenable, will continue monitoring as mentation improves hopefully so will mobility.  Hypokalemia: Improved with resolved hypomagnesemia as well.   Chronic  back pain:  - Continue low dose gabapentin  Hyperglycemia without diabetes: HbA1c 5.0%.  - Monitor with labs  Depression:  - Continue fluoxetine  Right arm swelling: Due to patient laying on that side initially, though continues, also swelling on left concerning for element of anasarca.  - Check venous U/S. Still pending. Checked with them, scheduled for noon.  Hypoalbuminemia with some third-spacing of IV fluids.  - Taking po, will encourage protein intake. Stopped IVF  Subjective: Eating a bit better, using left (nondominant) hand due to right hand swelling. She doesn't want to go to rehab.   Objective: Vitals:   05/21/22 1945 05/21/22 2341 05/22/22 0252 05/22/22 0839  BP: 113/75 117/75 129/84 125/80  Pulse: 85 83 78 86  Resp: 17 16 16 16   Temp: 98.1 F (36.7 C) 98.5 F (36.9 C) 97.7 F (36.5 C) 97.7 F (36.5 C)  TempSrc: Oral Oral  Oral  SpO2: 100% 100% 100% 100%  Weight:      Gen: 66 y.o. female in no distress Pulm: Nonlabored breathing room air. Clear. CV: Regular rate and rhythm. No murmur, rub, or gallop. No JVD, no LE dependent edema. GI: Abdomen soft, non-tender, non-distended, with normoactive bowel sounds.  Ext: Warm, RUE persistently significantly edematous. LUE less so. Skin: No open wounds on visualized skin. Neuro: Alert and oriented. No focal neurological deficits, though exam limited by chronic pain and RUE swelling. Psych: Judgement and insight appear marginal. Calm. Slowed thinking.    Data Personally reviewed: CBC: Recent Labs  Lab 05/17/22 1538 05/17/22 1542 05/20/22 0708  WBC 7.1  --  6.0  NEUTROABS 6.0  --   --   HGB 12.4 13.9 11.7*  HCT 39.0 41.0 35.1*  MCV 102.9*  --  97.2  PLT 171  --  285   Basic Metabolic Panel: Recent Labs  Lab 05/19/22 0248 05/19/22 1028 05/20/22 0708 05/21/22 0605 05/22/22 0626  NA 137 134* 136 136 134*  K 2.7* 3.1* 2.9* 3.0* 4.1  CL 106 104 105 104 104  CO2 22 21* 19* 24 21*  GLUCOSE 99 86 76 98 114*   BUN 5* 5* 5* <5* <5*  CREATININE 0.59 0.54 0.50 0.48 0.54  CALCIUM 8.4* 8.1* 8.3* 8.4* 8.6*  MG 1.7  --   --  1.6* 1.8   Albumin 2.8 CK: 83 INR 1.0 HbA1c 5% B12 555 UA: Negative  cEEG 8/2:  - Spike, right hemisphere, maximal right temporal region - Lateralized rhythmic delta activity, right hemisphere, maximal right temporal region - Continuous slow, generalized - This study showed evidence of epileptogenicity and cortical dysfunction arising from right hemisphere, maximal right temporal region. Additionally there is moderate diffuse encephalopathy, nonspecific etiology.  No definite seizures were seen during the study.  Upper extremity venous U/S pending  Family Communication: None at bedside  Disposition: Status is: Inpatient Remains inpatient appropriate because: Persistent encephalopathy, unsafe disposition, further medical work up. Planned Discharge Destination: TBD. SNF recommended, pt not functionally safe for discharge home at this time.  Tyrone Nine, MD 05/22/2022 11:22 AM Page by [email or web address]
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
## 2022-05-22 NOTE — Progress Notes (Signed)
Bilateral UE venous duplex study completed. Please see CV Proc for preliminary results.  Jannifer Fischler BS, RVT 05/22/2022 1:11 PM

## 2022-05-22 NOTE — Plan of Care (Signed)
  Problem: Clinical Measurements: Goal: Diagnostic test results will improve Outcome: Progressing Goal: Respiratory complications will improve Outcome: Progressing Goal: Cardiovascular complication will be avoided Outcome: Progressing   Problem: Coping: Goal: Level of anxiety will decrease Outcome: Progressing   

## 2022-05-23 DIAGNOSIS — E876 Hypokalemia: Secondary | ICD-10-CM

## 2022-05-23 DIAGNOSIS — G8929 Other chronic pain: Secondary | ICD-10-CM

## 2022-05-23 DIAGNOSIS — E8809 Other disorders of plasma-protein metabolism, not elsewhere classified: Secondary | ICD-10-CM

## 2022-05-23 DIAGNOSIS — R569 Unspecified convulsions: Secondary | ICD-10-CM | POA: Diagnosis not present

## 2022-05-23 DIAGNOSIS — F1024 Alcohol dependence with alcohol-induced mood disorder: Secondary | ICD-10-CM

## 2022-05-23 DIAGNOSIS — F1020 Alcohol dependence, uncomplicated: Secondary | ICD-10-CM | POA: Diagnosis not present

## 2022-05-23 DIAGNOSIS — R739 Hyperglycemia, unspecified: Secondary | ICD-10-CM

## 2022-05-23 DIAGNOSIS — Z91148 Patient's other noncompliance with medication regimen for other reason: Secondary | ICD-10-CM

## 2022-05-23 MED ORDER — THIAMINE HCL 100 MG PO TABS: 100.0000 mg | ORAL_TABLET | Freq: Every day | ORAL | Status: DC

## 2022-05-23 MED ORDER — PHENYTOIN SODIUM EXTENDED 100 MG PO CAPS: 100.0000 mg | ORAL_CAPSULE | Freq: Three times a day (TID) | ORAL | Status: DC

## 2022-05-23 MED ORDER — LEVETIRACETAM 500 MG PO TABS: 500.0000 mg | ORAL_TABLET | Freq: Two times a day (BID) | ORAL | Status: DC

## 2022-05-23 MED ORDER — HYDROCODONE-ACETAMINOPHEN 5-325 MG PO TABS
1.0000 | ORAL_TABLET | Freq: Four times a day (QID) | ORAL | Status: DC | PRN
  Administered 2022-05-23 – 2022-05-24 (×4): 1 via ORAL
  Filled 2022-05-23 (×4): qty 1

## 2022-05-23 MED ORDER — PHENOBARBITAL 60 MG PO TABS: 60.0000 mg | ORAL_TABLET | Freq: Every day | ORAL | 0 refills | Status: DC

## 2022-05-23 MED ORDER — ACETAMINOPHEN 325 MG PO TABS: 650.0000 mg | ORAL_TABLET | ORAL | Status: DC | PRN

## 2022-05-23 NOTE — Discharge Summary (Signed)
Physician Discharge Summary   Patient: [name] MRN: [id number] DOB: [birth year]  Admit date:     05/17/2022  Discharge date: 05/23/22  Discharge Physician: Tyrone Nine   PCP: [facility]   Recommendations at discharge:  Follow up with provider at SNF, continue rehabilitation.  Alcohol abuse cessation counseling to continue. Recommend AA involvement, daily contact with sponsor.  Suggest follow up with neurology in 6 weeks for seizure disorder.  Discharge Diagnoses: Active Problems:   Alcohol use disorder, severe, dependence (HCC)   Alcohol dependence with alcohol-induced mood disorder (HCC)   Hypokalemia   Status epilepticus (HCC)   Seizure (HCC)   Chronic pain   Hypoalbuminemia   Hyperglycemia   Nonadherence to medication  Hospital Course: 66 y.o. female with medical history significant of alcohol abuse, alcohol withdrawal seizure, anxiety/depression, sent from home for evaluation of recurrent seizures. She was post-ictal, afebrile, with negative CT head. Initiated continuous EEG and titrated AEDs per neurology recommendations with improvement. Her mentation has returned to baseline, though she remains deconditioned and will require rehabilitation at SNF prior to returning to prior environment. Please see details below.    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 Provoked by alcohol use and medication nonadherence.  - Continue keppra 1,000mg  BID (increased from home  dose) - Continue phenytoin  TID - Continue phenobarbital with planned taper. Will need  dose this evening and final dose on 8/6 qHS. - Continue seizure precautions - Neurology signed off 8/2.   Alcohol abuse and withdrawal:  - Evidence of withdrawal is resolved at this time. - Continue thiamine.  - Suggest 90 meetings (AA) in 90 days, daily contact with sponsor   Acute  metabolic encephalopathy, post-ictal encephalopathy: Improved.    Hypokalemia: Improved with resolved hypomagnesemia as well.    Chronic back pain:  - Continue low dose gabapentin. Note allergy to toradol.   Hyperglycemia without diabetes: HbA1c 5.0%.  - Monitor periodically    Depression:  - Continue fluoxetine   Bilateral cephalic vein SVT: Venous U/S ruled out DVT, but did reveal superficial thromboses in bilateral cephalic veins.  - Elevated extremities, warm compresses and pain medications prn.   Hypoalbuminemia: - Taking po, will encourage protein intake.    Consultants: Neurology Procedures performed: cEEG  Disposition: Skilled nursing facility Diet recommendation:  Regular diet DISCHARGE MEDICATION: Allergies as of 05/23/2022       Reactions   Keflex [cephalexin] Hives   Cephalosporins Hives   Prednisone Swelling   Toradol [ketorolac Tromethamine] Hives   Ultram [tramadol] Hives        Medication List     TAKE these medications    acetaminophen 325 MG tablet Commonly known as: TYLENOL Take 2 tablets (650 mg total) by mouth every 4 (four) hours as needed for fever, mild pain, moderate pain or headache.   FLUoxetine 20 MG capsule Commonly known as: PROZAC Take 1 capsule (20 mg total) by mouth daily.   gabapentin 100 MG capsule Commonly known as: NEURONTIN Take 1 capsule (100 mg total) by mouth 3 (three) times daily.   levETIRAcetam 500 MG tablet Commonly known as: KEPPRA Take 1 tablet (500 mg total) by mouth 2 (two) times daily.   naltrexone 50 MG tablet Commonly known as: DEPADE Take 50 mg by mouth daily.   PHENObarbital 60 MG tablet Commonly known as: LUMINAL Take 1 tablet (60 mg total) by mouth at bedtime for 2 days.  phenytoin 100 MG ER capsule Commonly known as: Dilantin Take 1 capsule (100 mg total) by mouth 3 (three) times daily.   thiamine 100 MG tablet Commonly known as: VITAMIN B1 Take 1 tablet (100 mg total) by mouth  daily. Start taking on: May 24, 2022        Follow-up Information     [facility] Follow up.   Contact information: [address] [place] [address] [phone]                Discharge Exam: BP 122/76 (BP Location: Left Arm)   Pulse 75   Temp 98.6 F (37 C) (Oral)   Resp 19   Wt 58.9 kg   SpO2 100%   BMI 24.54 kg/m   Chronically ill-appearing female in no distress RRR, no LE edema. 1-2+ R > L UE edema.  Nonlabored, clear Alert, oriented without focal deficits. Much more interactive and conversant than prior  Condition at discharge: stable  The results of significant diagnostics from this hospitalization (including imaging, microbiology, ancillary and laboratory) are listed below for reference.   Imaging Studies: VAS [place] UPPER EXTREMITY VENOUS DUPLEX  Result Date: 05/22/2022 UPPER VENOUS STUDY  Patient Name:  KESHANA KLEMZ  Date of Exam:   05/22/2022 Medical Rec #: [id number]       Accession #:    [id number] Date of Birth: Nov 22, 1955      Patient Gender: F Patient Age:   66 years Exam Location:  [facility] Procedure:      VAS [place] UPPER EXTREMITY VENOUS DUPLEX Referring Phys: Hazeline Junker --------------------------------------------------------------------------------  Indications: Swelling Comparison Study: No previous exam noted. Performing Technologist: Magdalene River BS, RVT  Examination Guidelines: A complete evaluation includes B-mode imaging, spectral Doppler, color Doppler, and power Doppler as needed of all accessible portions of each vessel. Bilateral testing is considered an integral part of a complete examination. Limited examinations for reoccurring indications may be performed as noted.  Right Findings: +----------+------------+---------+-----------+----------+-------+ RIGHT     CompressiblePhasicitySpontaneousPropertiesSummary +----------+------------+---------+-----------+----------+-------+ IJV            Full       Yes       Yes                      +----------+------------+---------+-----------+----------+-------+ Subclavian    Full       Yes       Yes                      +----------+------------+---------+-----------+----------+-------+ Axillary      Full       Yes       Yes                      +----------+------------+---------+-----------+----------+-------+ Brachial      Full                                          +----------+------------+---------+-----------+----------+-------+ Radial        Full                                          +----------+------------+---------+-----------+----------+-------+ Ulnar         Full                                          +----------+------------+---------+-----------+----------+-------+  Cephalic      None                                   Acute  +----------+------------+---------+-----------+----------+-------+ Basilic       Full                                          +----------+------------+---------+-----------+----------+-------+ The right cephalic vein is non-compressible with no flow detected at the mid arm throughout to the IV/line site at the forearm.  Left Findings: +----------+------------+---------+-----------+----------+-------+ LEFT      CompressiblePhasicitySpontaneousPropertiesSummary +----------+------------+---------+-----------+----------+-------+ IJV           Full       Yes       Yes                      +----------+------------+---------+-----------+----------+-------+ Subclavian    Full       Yes       Yes                      +----------+------------+---------+-----------+----------+-------+ Axillary      Full       Yes       Yes                      +----------+------------+---------+-----------+----------+-------+ Brachial      Full                                          +----------+------------+---------+-----------+----------+-------+ Radial         Full                                          +----------+------------+---------+-----------+----------+-------+ Ulnar         Full                                          +----------+------------+---------+-----------+----------+-------+ Cephalic      None                                          +----------+------------+---------+-----------+----------+-------+ Basilic       Full                                          +----------+------------+---------+-----------+----------+-------+ The left cephalic is non-compressible with no flow detected at the proximal forearm just proximal to the IV/line site.  Summary:  Right: No evidence of deep vein thrombosis in the upper extremity. Findings consistent with acute superficial vein thrombosis involving the right cephalic vein.  Left: No evidence of deep vein thrombosis in the upper extremity. Findings consistent with acute superficial vein thrombosis involving the left cephalic vein.  *See table(s) above for measurements and observations.  Diagnosing physician: Waverly Ferrari MD Electronically signed by Waverly Ferrari  MD on 05/22/2022 at 7:29:11 PM.    Final    Overnight EEG with video  Result Date: 05/18/2022 Charlsie Quest, MD     05/19/2022 10:15 AM Patient Name: Mayrin Schmuck MRN: [id number] Epilepsy Attending: Charlsie Quest Referring Physician/Provider: Rejeana Brock, MD Duration: 05/17/2022 1656 to 05/18/2022 1656  Patient history:  66 year old female who presented in status epilepticus. EEG to evaluate for seizure  Level of alertness:  lethargic  AEDs during EEG study: LEV, PHT  Technical aspects: This EEG study was done with scalp electrodes positioned according to the 10-20 International system of electrode placement. Electrical activity was reviewed with band pass filter of 1-70Hz , sensitivity of 7 uV/mm, display speed of 11mm/sec with a  notched filter applied as appropriate. EEG data were recorded  continuously and digitally stored.  Video monitoring was available and reviewed as appropriate.  Description: At the beginning of the study, EEG showed 2 to 3 Hz sharply contoured rhythmic delta slowing admixed with 1.5 to 2 Hz spike and wave in the right hemisphere, maximal right temporal region with evolution in morphology and frequency.  No clinical changes were noted during this EEG pattern.  Consistent with focal nonconvulsive status epilepticus.  IV fosphenytoin was administered on 05/17/2022 at around 2100 after which status epilepticus resolved.  EEG then showed lateralized periodic discharges with overriding fast activity in right hemisphere, maximal right temporal region at 1 to 2 Hz which at times appears rhythmic consistent with brief ictal-interictal rhythmic discharges. Additionally there is continuous generalized 2 to 3 Hz delta slowing with overriding 15 to 18 Hz beta activity.  Hyperventilation and photic stimulation were not performed.   Of note, part of EEG was not recorded due to patient being moved from emergency room to inpatient room..  ABNORMALITY -Electrographic status epilepticus, right hemisphere, maximal right temporal region -Brief ictal interictal rhythmic discharges, right hemisphere, maximal right temporal region -Lateralized periodic discharges with overriding fast activity, right hemisphere, maximal right temporal region - Continuous slow, generalized  IMPRESSION: This study was initially suggestive of electrographic status epilepticus arising from right hemisphere, maximal right temporal region. IV fosphenytoin was administered on 05/17/2022 at around 2100 after with status epilepticus resolved.  EEG then showed evidence of epileptogenicity arising from right hemisphere, maximal right temporal region which is on the ictal-interictal continuum with high potential for seizure recurrence.  Additionally there is moderate to severe diffuse encephalopathy, nonspecific etiology.  Charlsie Quest    EEG adult  Result Date: 05/17/2022 Charlsie Quest, MD     05/17/2022  8:10 PM Patient Name: Shamaria Kavan MRN: [id number] Epilepsy Attending: Charlsie Quest Referring Physician/Provider: Rejeana Brock, MD Date: 05/17/2022 Duration: 22.05 mins Patient history:  66 year old female who presented in status epilepticus. EEG to evaluate for seizure Level of alertness:  lethargic AEDs during EEG study: LEV, Technical aspects: This EEG study was done with scalp electrodes positioned according to the 10-20 International system of electrode placement. Electrical activity was reviewed with band pass filter of 1-70Hz , sensitivity of 7 uV/mm, display speed of 10mm/sec with a  notched filter applied as appropriate. EEG data were recorded continuously and digitally stored.  Video monitoring was available and reviewed as appropriate. Description: No posterior dominant rhythm was seen. EEG showed continuous 2-3Hz  delta slowing admixed with an excessive amount of 15 to 18 Hz beta activity in left hemisphere. There is sharply contoured 2-3hz  delta slowing admixed with 15-18hz  beta activity in right hemisphere, maximal right temporal region which at  times appears rhythmic with evolution in frequency and morphology.No clinical changes are noted during this EEG change.  Hyperventilation and photic stimulation were not performed.   ABNORMALITY - Lateralized rhythmic delta slowing, right hemisphere, maximal right temporal region - Continuous slow, left hemisphere IMPRESSION: This study is suggestive of cortical dysfunction arising from right hemisphere, maximal temporal region which is on the ictal-interictal continuum. Please obtain long term monitoring for further characterization. Additionally there is moderate to severe diffuse encephalopathy, nonspecific etiology. Priyanka Annabelle Harman Yadav   CT CERVICAL SPINE WO CONTRAST  Result Date: 05/17/2022 CLINICAL DATA:  Neck trauma (Age >= 65y) EXAM: CT CERVICAL SPINE  WITHOUT CONTRAST TECHNIQUE: Multidetector CT imaging of the cervical spine was performed without intravenous contrast. Multiplanar CT image reconstructions were also generated. RADIATION DOSE REDUCTION: This exam was performed according to the departmental dose-optimization program which includes automated exposure control, adjustment of the mA and/or kV according to patient size and/or use of iterative reconstruction technique. COMPARISON:  12/11/2018 FINDINGS: Alignment: Facet joints are aligned without dislocation or traumatic listhesis. Dens and lateral masses are aligned. Skull base and vertebrae: Prior C4-C7 ACDF. No evidence of acute fracture. Soft tissues and spinal canal: No prevertebral fluid or swelling. No visible canal hematoma. Disc levels:  Similar degree of multilevel spondylosis. Upper chest: Negative. Other: None. IMPRESSION: 1. No acute fracture or traumatic listhesis of the cervical spine. 2. Prior C4-C7 ACDF. Electronically Signed   By: Duanne GuessNicholas  Plundo D.O.   On: 05/17/2022 16:35   CT HEAD CODE STROKE WO CONTRAST  Result Date: 05/17/2022 CLINICAL DATA:  Code stroke.  Seizure. EXAM: CT HEAD WITHOUT CONTRAST TECHNIQUE: Contiguous axial images were obtained from the base of the skull through the vertex without intravenous contrast. RADIATION DOSE REDUCTION: This exam was performed according to the departmental dose-optimization program which includes automated exposure control, adjustment of the mA and/or kV according to patient size and/or use of iterative reconstruction technique. COMPARISON:  CT head without contrast 04/02/2022. MR head without contrast 03/06/2022 FINDINGS: Brain: Mild periventricular and subcortical white matter hypoattenuation is again seen bilaterally. No acute infarct, hemorrhage, or mass lesion is present. Basal ganglia are intact. Insular ribbon is normal bilaterally. No acute or focal cortical abnormality is present. Temporal lobes are unremarkable. The ventricles  are of normal size. No significant extraaxial fluid collection is present. The craniocervical junction is normal. Upper cervical spine is within normal limits. Marrow signal is unremarkable. Vascular: No hyperdense vessel or unexpected calcification. Skull: Calvarium is intact. No focal lytic or blastic lesions are present. Soft tissue swelling is present over the occipital scalp. This may reflect trauma. No underlying fracture is present. No foreign body is present. Sinuses/Orbits: The paranasal sinuses and mastoid air cells are clear. The globes and orbits are within normal limits. ASPECTS [facility]([place] Stroke Program Early CT Score) - Ganglionic level infarction (caudate, lentiform nuclei, internal capsule, insula, M1-M3 cortex): 7/7 - Supraganglionic infarction (M4-M6 cortex): 3/3 Total score (0-10 with 10 being normal): 10/10 IMPRESSION: 1. Soft tissue swelling over the occipital scalp without underlying fracture. This may reflect trauma. 2. No acute intracranial abnormality or significant interval change. 3. Stable mild white matter disease. This likely reflects the sequela of chronic microvascular ischemia. 4. Aspects 10/10 * The above was relayed via text pager to Dr. Amada JupiterKirkpatrick on 05/17/2022 at 15:52 . Electronically Signed   By: Marin Robertshristopher  Mattern M.D.   On: 05/17/2022 15:53    Microbiology: Results for orders placed or performed during the hospital encounter of 05/17/22  Resp Panel by RT-PCR (Flu A&B, Covid) Anterior Nasal Swab     Status: None   Collection Time: 05/17/22  3:24 PM   Specimen: Anterior Nasal Swab  Result Value Ref Range Status   SARS Coronavirus 2 by RT PCR NEGATIVE NEGATIVE Final    Comment: (NOTE) SARS-CoV-2 target nucleic acids are NOT DETECTED.  The SARS-CoV-2 RNA is generally detectable in upper respiratory specimens during the acute phase of infection. The lowest concentration of SARS-CoV-2 viral copies this assay can detect is 138 copies/mL. A negative result does not  preclude SARS-Cov-2 infection and should not be used as the sole basis for treatment or other patient management decisions. A negative result may occur with  improper specimen collection/handling, submission of specimen other than nasopharyngeal swab, presence of viral mutation(s) within the areas targeted by this assay, and inadequate number of viral copies(<138 copies/mL). A negative result must be combined with clinical observations, patient history, and epidemiological information. The expected result is Negative.  Fact Sheet for Patients:  BloggerCourse.com  Fact Sheet for Healthcare Providers:  SeriousBroker.it  This test is no t yet approved or cleared by the [place] FDA and  has been authorized for detection and/or diagnosis of SARS-CoV-2 by FDA under an Emergency Use Authorization (EUA). This EUA will remain  in effect (meaning this test can be used) for the duration of the COVID-19 declaration under Section 564(b)(1) of the Act, 21 U.S.C.section 360bbb-3(b)(1), unless the authorization is terminated  or revoked sooner.       Influenza A by PCR NEGATIVE NEGATIVE Final   Influenza B by PCR NEGATIVE NEGATIVE Final    Comment: (NOTE) The Xpert Xpress SARS-CoV-2/FLU/RSV plus assay is intended as an aid in the diagnosis of influenza from Nasopharyngeal swab specimens and should not be used as a sole basis for treatment. Nasal washings and aspirates are unacceptable for Xpert Xpress SARS-CoV-2/FLU/RSV testing.  Fact Sheet for Patients: BloggerCourse.com  Fact Sheet for Healthcare Providers: SeriousBroker.it  This test is not yet approved or cleared by the [place] FDA and has been authorized for detection and/or diagnosis of SARS-CoV-2 by FDA under an Emergency Use Authorization (EUA). This EUA will remain in effect (meaning this test can be used) for the  duration of the COVID-19 declaration under Section 564(b)(1) of the Act, 21 U.S.C. section 360bbb-3(b)(1), unless the authorization is terminated or revoked.  Performed at [facility], [address] [place], [place] [address]     Labs: CBC: Recent Labs  Lab 05/17/22 1538 05/17/22 1542 05/20/22 0708  WBC 7.1  --  6.0  NEUTROABS 6.0  --   --   HGB 12.4 13.9 11.7*  HCT 39.0 41.0 35.1*  MCV 102.9*  --  97.2  PLT 171  --  285   Basic Metabolic Panel: Recent Labs  Lab 05/19/22 0248 05/19/22 1028 05/20/22 0708 05/21/22 0605 05/22/22 0626  NA 137 134* 136 136 134*  K 2.7* 3.1* 2.9* 3.0* 4.1  CL 106 104 105 104 104  CO2 22 21* 19* 24 21*  GLUCOSE 99 86 76 98 114*  BUN 5* 5* 5* <5* <5*  CREATININE 0.59 0.54 0.50 0.48 0.54  CALCIUM 8.4* 8.1* 8.3* 8.4* 8.6*  MG 1.7  --   --  1.6* 1.8   Liver Function Tests: Recent Labs  Lab 05/17/22 1538 05/20/22 0708 05/22/22 0626  AST 36 18 26  ALT 18 12 13   ALKPHOS 59 49 50  BILITOT 0.7 1.0 0.6  PROT  6.8 6.0* 5.9*  ALBUMIN 3.8 2.8* 2.7*   CBG: No results for input(s): "GLUCAP" in the last 168 hours.  Discharge time spent: greater than 30 minutes.  Signed: Tyrone Nine, MD [facility] 05/23/2022

## 2022-05-23 NOTE — Progress Notes (Signed)
SW spoke to pt who reports agreeable to SNF for STR and is requesting [facility]. Navi/[employer] auth request for SNF submitted, ref # [id number]. Per Olegario Messier with [facility], they can admit pt over weekend if auth received. MD updated.   Dellie Burns, MSW, LCSW [phone] (coverage)

## 2022-05-24 DIAGNOSIS — G8929 Other chronic pain: Secondary | ICD-10-CM | POA: Diagnosis not present

## 2022-05-24 DIAGNOSIS — F1024 Alcohol dependence with alcohol-induced mood disorder: Secondary | ICD-10-CM | POA: Diagnosis not present

## 2022-05-24 DIAGNOSIS — R569 Unspecified convulsions: Secondary | ICD-10-CM | POA: Diagnosis not present

## 2022-05-24 DIAGNOSIS — F1020 Alcohol dependence, uncomplicated: Secondary | ICD-10-CM | POA: Diagnosis not present

## 2022-05-24 NOTE — Progress Notes (Signed)
[employer]i/[employer] auth received and pt has a bed at [facility] today. Will need updated dc summary for today. MD updated.   Dellie Burns, MSW, LCSW [phone] (coverage)

## 2022-05-24 NOTE — TOC Transition Note (Signed)
Transition of Care [facility]) - CM/SW Discharge Note   Patient Details  Name: Laurell Coalson MRN: [id number] Date of Birth: 1956-04-26  [facility] CM/SW Contact:  Deatra Robinson, [place] Phone Number: 05/24/2022, 10:48 AM   Clinical Narrative: Pt for dc to [facility] today. Spoke to Kia in admissions who confirmed they are prepared to admit pt to room 108. RN provided with number for report and [facility] arranged for transport. Pt aware of dc and reports agreeable. SW signing off at dc.   Dellie Burns, MSW, LCSW [phone] (coverage)        Final next level of care: Skilled Nursing Facility Barriers to Discharge: No Barriers Identified   Patient Goals and CMS Choice   CMS Medicare.gov Compare Post Acute Care list provided to:: Patient Choice offered to / list presented to : Patient  Discharge Placement              Patient chooses bed at: [facility] Patient to be transferred to facility by: [facility]   Patient and family notified of of transfer: 05/24/22  Discharge Plan and Services     Post Acute Care Choice: Skilled Nursing Facility                               Social Determinants of Health (SDOH) Interventions     Readmission Risk Interventions     No data to display

## 2022-05-24 NOTE — Progress Notes (Signed)
Attempted to call report to [facility]

## 2022-05-24 NOTE — Discharge Summary (Signed)
Physician Discharge Summary   Patient: [name] MRN: [id number] DOB: Feb 21, 1956  Admit date:     05/17/2022  Discharge date: 05/24/22  Discharge Physician: Tyrone Nine   PCP: [facility]   Recommendations at discharge:  Follow up with provider at SNF, continue rehabilitation.  Alcohol abuse cessation counseling to continue. Recommend AA involvement, daily contact with sponsor.  Suggest follow up with neurology in 6 weeks for seizure disorder.  Discharge Diagnoses: Active Problems:   Alcohol use disorder, severe, dependence (HCC)   Alcohol dependence with alcohol-induced mood disorder (HCC)   Hypokalemia   Status epilepticus (HCC)   Seizure (HCC)   Chronic pain   Hypoalbuminemia   Hyperglycemia   Nonadherence to medication  Hospital Course: 66 y.o. female with medical history significant of alcohol abuse, alcohol withdrawal seizure, anxiety/depression, sent from home for evaluation of recurrent seizures. She was post-ictal, afebrile, with negative CT head. Initiated continuous EEG and titrated AEDs per neurology recommendations with improvement. Her mentation has returned to baseline, though she remains deconditioned and will require rehabilitation at SNF prior to returning to prior environment. Please see details below.    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 Provoked by alcohol use and medication nonadherence.  - Continue keppra 1,000mg  BID (increased from home 500mg  dose) - Continue phenytoin 100mg  TID - Continue phenobarbital with planned taper. Will need 60mg  dose this evening and final dose on 8/6 qHS. - Continue seizure precautions - Neurology signed off 8/2.   Alcohol abuse and withdrawal:  - Evidence of withdrawal is resolved at this time. - Continue thiamine.  - Suggest 90 meetings (AA) in 90 days, daily contact with sponsor   Acute  metabolic encephalopathy, post-ictal encephalopathy: Improved.    Hypokalemia: Improved with resolved hypomagnesemia as well.    Chronic back pain:  - Continue low dose gabapentin. Note allergy to toradol.   Hyperglycemia without diabetes: HbA1c 5.0%.  - Monitor periodically    Depression:  - Continue fluoxetine   Bilateral cephalic vein SVT: Venous U/S ruled out DVT, but did reveal superficial thromboses in bilateral cephalic veins.  - Elevated extremities, warm compresses and pain medications prn.   Hypoalbuminemia: - Taking po, will encourage protein intake.    Consultants: Neurology Procedures performed: cEEG  Disposition: Skilled nursing facility Diet recommendation: Regular diet DISCHARGE MEDICATION: Allergies as of 05/24/2022       Reactions   Keflex [cephalexin] Hives   Cephalosporins Hives   Prednisone Swelling   Toradol [ketorolac Tromethamine] Hives   Ultram [tramadol] Hives        Medication List     TAKE these medications    acetaminophen 325 MG tablet Commonly known as: TYLENOL Take 2 tablets (650 mg total) by mouth every 4 (four) hours as needed for fever, mild pain, moderate pain or headache.   FLUoxetine 20 MG capsule Commonly known as: PROZAC Take 1 capsule (20 mg total) by mouth daily.   gabapentin 100 MG capsule Commonly known as: NEURONTIN Take 1 capsule (100 mg total) by mouth 3 (three) times daily.   levETIRAcetam 500 MG tablet Commonly known as: KEPPRA Take 1 tablet (500 mg total) by mouth 2 (two) times daily.   naltrexone 50 MG tablet Commonly known as: DEPADE Take 50 mg by mouth daily.   PHENObarbital 60 MG tablet Commonly known as: LUMINAL Take 1 tablet (60 mg total) by mouth at bedtime for 2 days.  phenytoin 100 MG ER capsule Commonly known as: Dilantin Take 1 capsule (100 mg total) by mouth 3 (three) times daily.   thiamine 100 MG tablet Commonly known as: VITAMIN B1 Take 1 tablet (100 mg total) by mouth daily.         Contact information for follow-up providers     [facility] Follow up.   Contact information: [address] [place] [address] [phone]              Contact information for after-discharge care     Destination     [facility] Preferred SNF .   Service: Skilled Nursing Contact information: [address] [place] [address] [phone]                    Discharge Exam: BP (!) 149/85 (BP Location: Left Arm)   Pulse 73   Temp 98.3 F (36.8 C) (Oral)   Resp 19   Wt 58.9 kg   SpO2 100%   BMI 24.54 kg/m   Chronically ill-appearing female in no distress RRR, no LE edema. Much improved UE edema, trace.  Nonlabored, clear Alert, oriented without focal deficits. Much more interactive and conversant than prior  Condition at discharge: stable  The results of significant diagnostics from this hospitalization (including imaging, microbiology, ancillary and laboratory) are listed below for reference.   Imaging Studies: VAS [place] UPPER EXTREMITY VENOUS DUPLEX  Result Date: 05/22/2022 UPPER VENOUS STUDY  Patient Name:  FELICIA BLOOMQUIST  Date of Exam:   05/22/2022 Medical Rec #: [id number]       Accession #:    [id number] Date of Birth: 05-18-1956      Patient Gender: F Patient Age:   66 years Exam Location:  [facility] Procedure:      VAS [place] UPPER EXTREMITY VENOUS DUPLEX Referring Phys: Hazeline Junker --------------------------------------------------------------------------------  Indications: Swelling Comparison Study: No previous exam noted. Performing Technologist: Magdalene River BS, RVT  Examination Guidelines: A complete evaluation includes B-mode imaging, spectral Doppler, color Doppler, and power Doppler as needed of all accessible portions of each vessel. Bilateral testing is considered an integral part of a complete examination. Limited examinations for reoccurring indications may be  performed as noted.  Right Findings: +----------+------------+---------+-----------+----------+-------+ RIGHT     CompressiblePhasicitySpontaneousPropertiesSummary +----------+------------+---------+-----------+----------+-------+ IJV           Full       Yes       Yes                      +----------+------------+---------+-----------+----------+-------+ Subclavian    Full       Yes       Yes                      +----------+------------+---------+-----------+----------+-------+ Axillary      Full       Yes       Yes                      +----------+------------+---------+-----------+----------+-------+ Brachial      Full                                          +----------+------------+---------+-----------+----------+-------+ Radial        Full                                          +----------+------------+---------+-----------+----------+-------+  Ulnar         Full                                          +----------+------------+---------+-----------+----------+-------+ Cephalic      None                                   Acute  +----------+------------+---------+-----------+----------+-------+ Basilic       Full                                          +----------+------------+---------+-----------+----------+-------+ The right cephalic vein is non-compressible with no flow detected at the mid arm throughout to the IV/line site at the forearm.  Left Findings: +----------+------------+---------+-----------+----------+-------+ LEFT      CompressiblePhasicitySpontaneousPropertiesSummary +----------+------------+---------+-----------+----------+-------+ IJV           Full       Yes       Yes                      +----------+------------+---------+-----------+----------+-------+ Subclavian    Full       Yes       Yes                      +----------+------------+---------+-----------+----------+-------+ Axillary      Full        Yes       Yes                      +----------+------------+---------+-----------+----------+-------+ Brachial      Full                                          +----------+------------+---------+-----------+----------+-------+ Radial        Full                                          +----------+------------+---------+-----------+----------+-------+ Ulnar         Full                                          +----------+------------+---------+-----------+----------+-------+ Cephalic      None                                          +----------+------------+---------+-----------+----------+-------+ Basilic       Full                                          +----------+------------+---------+-----------+----------+-------+ The left cephalic is non-compressible with no flow detected at the proximal forearm just proximal to the IV/line site.  Summary:  Right: No evidence of deep vein thrombosis in the upper extremity. Findings consistent with  acute superficial vein thrombosis involving the right cephalic vein.  Left: No evidence of deep vein thrombosis in the upper extremity. Findings consistent with acute superficial vein thrombosis involving the left cephalic vein.  *See table(s) above for measurements and observations.  Diagnosing physician: Waverly Ferrari MD Electronically signed by Waverly Ferrari MD on 05/22/2022 at 7:29:11 PM.    Final    Overnight EEG with video  Result Date: 05/18/2022 Charlsie Quest, MD     05/19/2022 10:15 AM Patient Name: Monie Shere MRN: [id number] Epilepsy Attending: Charlsie Quest Referring Physician/Provider: Rejeana Brock, MD Duration: 05/17/2022 1656 to 05/18/2022 1656  Patient history:  66 year old female who presented in status epilepticus. EEG to evaluate for seizure  Level of alertness:  lethargic  AEDs during EEG study: LEV, PHT  Technical aspects: This EEG study was done with scalp electrodes positioned  according to the 10-20 International system of electrode placement. Electrical activity was reviewed with band pass filter of 1-70Hz , sensitivity of 7 uV/mm, display speed of 57mm/sec with a  notched filter applied as appropriate. EEG data were recorded continuously and digitally stored.  Video monitoring was available and reviewed as appropriate.  Description: At the beginning of the study, EEG showed 2 to 3 Hz sharply contoured rhythmic delta slowing admixed with 1.5 to 2 Hz spike and wave in the right hemisphere, maximal right temporal region with evolution in morphology and frequency.  No clinical changes were noted during this EEG pattern.  Consistent with focal nonconvulsive status epilepticus.  IV fosphenytoin was administered on 05/17/2022 at around 2100 after which status epilepticus resolved.  EEG then showed lateralized periodic discharges with overriding fast activity in right hemisphere, maximal right temporal region at 1 to 2 Hz which at times appears rhythmic consistent with brief ictal-interictal rhythmic discharges. Additionally there is continuous generalized 2 to 3 Hz delta slowing with overriding 15 to 18 Hz beta activity.  Hyperventilation and photic stimulation were not performed.   Of note, part of EEG was not recorded due to patient being moved from emergency room to inpatient room..  ABNORMALITY -Electrographic status epilepticus, right hemisphere, maximal right temporal region -Brief ictal interictal rhythmic discharges, right hemisphere, maximal right temporal region -Lateralized periodic discharges with overriding fast activity, right hemisphere, maximal right temporal region - Continuous slow, generalized  IMPRESSION: This study was initially suggestive of electrographic status epilepticus arising from right hemisphere, maximal right temporal region. IV fosphenytoin was administered on 05/17/2022 at around 2100 after with status epilepticus resolved.  EEG then showed evidence of  epileptogenicity arising from right hemisphere, maximal right temporal region which is on the ictal-interictal continuum with high potential for seizure recurrence.  Additionally there is moderate to severe diffuse encephalopathy, nonspecific etiology.  Charlsie Quest    EEG adult  Result Date: 05/17/2022 Charlsie Quest, MD     05/17/2022  8:10 PM Patient Name: Chassidy Layson MRN: [id number] Epilepsy Attending: Charlsie Quest Referring Physician/Provider: Rejeana Brock, MD Date: 05/17/2022 Duration: 22.05 mins Patient history:  66 year old female who presented in status epilepticus. EEG to evaluate for seizure Level of alertness:  lethargic AEDs during EEG study: LEV, Technical aspects: This EEG study was done with scalp electrodes positioned according to the 10-20 International system of electrode placement. Electrical activity was reviewed with band pass filter of 1-70Hz , sensitivity of 7 uV/mm, display speed of 75mm/sec with a  notched filter applied as appropriate. EEG data were recorded continuously and digitally stored.  Video monitoring was available and  reviewed as appropriate. Description: No posterior dominant rhythm was seen. EEG showed continuous 2-3Hz  delta slowing admixed with an excessive amount of 15 to 18 Hz beta activity in left hemisphere. There is sharply contoured 2-3hz  delta slowing admixed with 15-18hz  beta activity in right hemisphere, maximal right temporal region which at times appears rhythmic with evolution in frequency and morphology.No clinical changes are noted during this EEG change.  Hyperventilation and photic stimulation were not performed.   ABNORMALITY - Lateralized rhythmic delta slowing, right hemisphere, maximal right temporal region - Continuous slow, left hemisphere IMPRESSION: This study is suggestive of cortical dysfunction arising from right hemisphere, maximal temporal region which is on the ictal-interictal continuum. Please obtain long term  monitoring for further characterization. Additionally there is moderate to severe diffuse encephalopathy, nonspecific etiology. Priyanka Annabelle Harman   CT CERVICAL SPINE WO CONTRAST  Result Date: 05/17/2022 CLINICAL DATA:  Neck trauma (Age >= 65y) EXAM: CT CERVICAL SPINE WITHOUT CONTRAST TECHNIQUE: Multidetector CT imaging of the cervical spine was performed without intravenous contrast. Multiplanar CT image reconstructions were also generated. RADIATION DOSE REDUCTION: This exam was performed according to the departmental dose-optimization program which includes automated exposure control, adjustment of the mA and/or kV according to patient size and/or use of iterative reconstruction technique. COMPARISON:  12/11/2018 FINDINGS: Alignment: Facet joints are aligned without dislocation or traumatic listhesis. Dens and lateral masses are aligned. Skull base and vertebrae: Prior C4-C7 ACDF. No evidence of acute fracture. Soft tissues and spinal canal: No prevertebral fluid or swelling. No visible canal hematoma. Disc levels:  Similar degree of multilevel spondylosis. Upper chest: Negative. Other: None. IMPRESSION: 1. No acute fracture or traumatic listhesis of the cervical spine. 2. Prior C4-C7 ACDF. Electronically Signed   By: Duanne Guess D.O.   On: 05/17/2022 16:35   CT HEAD CODE STROKE WO CONTRAST  Result Date: 05/17/2022 CLINICAL DATA:  Code stroke.  Seizure. EXAM: CT HEAD WITHOUT CONTRAST TECHNIQUE: Contiguous axial images were obtained from the base of the skull through the vertex without intravenous contrast. RADIATION DOSE REDUCTION: This exam was performed according to the departmental dose-optimization program which includes automated exposure control, adjustment of the mA and/or kV according to patient size and/or use of iterative reconstruction technique. COMPARISON:  CT head without contrast 04/02/2022. MR head without contrast 03/06/2022 FINDINGS: Brain: Mild periventricular and subcortical white  matter hypoattenuation is again seen bilaterally. No acute infarct, hemorrhage, or mass lesion is present. Basal ganglia are intact. Insular ribbon is normal bilaterally. No acute or focal cortical abnormality is present. Temporal lobes are unremarkable. The ventricles are of normal size. No significant extraaxial fluid collection is present. The craniocervical junction is normal. Upper cervical spine is within normal limits. Marrow signal is unremarkable. Vascular: No hyperdense vessel or unexpected calcification. Skull: Calvarium is intact. No focal lytic or blastic lesions are present. Soft tissue swelling is present over the occipital scalp. This may reflect trauma. No underlying fracture is present. No foreign body is present. Sinuses/Orbits: The paranasal sinuses and mastoid air cells are clear. The globes and orbits are within normal limits. ASPECTS [facility] Stroke Program Early CT Score) - Ganglionic level infarction (caudate, lentiform nuclei, internal capsule, insula, M1-M3 cortex): 7/7 - Supraganglionic infarction (M4-M6 cortex): 3/3 Total score (0-10 with 10 being normal): 10/10 IMPRESSION: 1. Soft tissue swelling over the occipital scalp without underlying fracture. This may reflect trauma. 2. No acute intracranial abnormality or significant interval change. 3. Stable mild white matter disease. This likely reflects the sequela of chronic  microvascular ischemia. 4. Aspects 10/10 * The above was relayed via text pager to Dr. Amada JupiterKirkpatrick on 05/17/2022 at 15:52 . Electronically Signed   By: Marin Robertshristopher  Mattern M.D.   On: 05/17/2022 15:53    Microbiology: Results for orders placed or performed during the hospital encounter of 05/17/22  Resp Panel by RT-PCR (Flu A&B, Covid) Anterior Nasal Swab     Status: None   Collection Time: 05/17/22  3:24 PM   Specimen: Anterior Nasal Swab  Result Value Ref Range Status   SARS Coronavirus 2 by RT PCR NEGATIVE NEGATIVE Final    Comment: (NOTE) SARS-CoV-2 target  nucleic acids are NOT DETECTED.  The SARS-CoV-2 RNA is generally detectable in upper respiratory specimens during the acute phase of infection. The lowest concentration of SARS-CoV-2 viral copies this assay can detect is 138 copies/mL. A negative result does not preclude SARS-Cov-2 infection and should not be used as the sole basis for treatment or other patient management decisions. A negative result may occur with  improper specimen collection/handling, submission of specimen other than nasopharyngeal swab, presence of viral mutation(s) within the areas targeted by this assay, and inadequate number of viral copies(<138 copies/mL). A negative result must be combined with clinical observations, patient history, and epidemiological information. The expected result is Negative.  Fact Sheet for Patients:  BloggerCourse.comhttps://www.fda.gov/media/152166/download  Fact Sheet for Healthcare Providers:  SeriousBroker.ithttps://www.fda.gov/media/152162/download  This test is no t yet approved or cleared by the [place] FDA and  has been authorized for detection and/or diagnosis of SARS-CoV-2 by FDA under an Emergency Use Authorization (EUA). This EUA will remain  in effect (meaning this test can be used) for the duration of the COVID-19 declaration under Section 564(b)(1) of the Act, 21 U.S.C.section 360bbb-3(b)(1), unless the authorization is terminated  or revoked sooner.       Influenza A by PCR NEGATIVE NEGATIVE Final   Influenza B by PCR NEGATIVE NEGATIVE Final    Comment: (NOTE) The Xpert Xpress SARS-CoV-2/FLU/RSV plus assay is intended as an aid in the diagnosis of influenza from Nasopharyngeal swab specimens and should not be used as a sole basis for treatment. Nasal washings and aspirates are unacceptable for Xpert Xpress SARS-CoV-2/FLU/RSV testing.  Fact Sheet for Patients: BloggerCourse.comhttps://www.fda.gov/media/152166/download  Fact Sheet for Healthcare  Providers: SeriousBroker.ithttps://www.fda.gov/media/152162/download  This test is not yet approved or cleared by the [place] FDA and has been authorized for detection and/or diagnosis of SARS-CoV-2 by FDA under an Emergency Use Authorization (EUA). This EUA will remain in effect (meaning this test can be used) for the duration of the COVID-19 declaration under Section 564(b)(1) of the Act, 21 U.S.C. section 360bbb-3(b)(1), unless the authorization is terminated or revoked.  Performed at [facility], [address] [place], [place] [address]     Labs: CBC: Recent Labs  Lab 05/17/22 1538 05/17/22 1542 05/20/22 0708  WBC 7.1  --  6.0  NEUTROABS 6.0  --   --   HGB 12.4 13.9 11.7*  HCT 39.0 41.0 35.1*  MCV 102.9*  --  97.2  PLT 171  --  285   Basic Metabolic Panel: Recent Labs  Lab 05/19/22 0248 05/19/22 1028 05/20/22 0708 05/21/22 0605 05/22/22 0626  NA 137 134* 136 136 134*  K 2.7* 3.1* 2.9* 3.0* 4.1  CL 106 104 105 104 104  CO2 22 21* 19* 24 21*  GLUCOSE 99 86 76 98 114*  BUN 5* 5* 5* <5* <5*  CREATININE 0.59 0.54 0.50 0.48 0.54  CALCIUM 8.4*  8.1* 8.3* 8.4* 8.6*  MG 1.7  --   --  1.6* 1.8   Liver Function Tests: Recent Labs  Lab 05/17/22 1538 05/20/22 0708 05/22/22 0626  AST 36 18 26  ALT 18 12 13   ALKPHOS 59 49 50  BILITOT 0.7 1.0 0.6  PROT 6.8 6.0* 5.9*  ALBUMIN 3.8 2.8* 2.7*   CBG: No results for input(s): "GLUCAP" in the last 168 hours.  Discharge time spent: greater than 30 minutes.  Signed: , MD [facility] 05/24/2022

## 2022-06-04 ENCOUNTER — Ambulatory Visit
Admission: EM | Admit: 2022-06-04 | Discharge: 2022-06-04 | Disposition: A | Discharge status: Home / Self Care | Payer: 59

## 2022-06-04 NOTE — ED Notes (Signed)
Patient is being discharged from the Urgent Care and sent to the Emergency Department via POA with significant other. Per L. Morgan-Scales PA-C , patient is in need of higher level of care due to need for further evaluation. Patient is aware and verbalizes understanding of plan of care.  Vitals:   06/04/22 1332  BP: 133/83  Pulse: 72  Resp: 16  Temp: 98.4 F (36.9 C)  SpO2: 96%

## 2022-06-04 NOTE — ED Triage Notes (Signed)
The paitent states she has pain to her right arm that radiates to her right shoulder. There is redness and swelling to the area (right FA). The pt states she recently had a IV at the site at a prior hospital visit.

## 2022-06-04 NOTE — ED Provider Notes (Signed)
Patient complains of pain in her right arm that radiates up her arm up to her neck.  Patient states she has noticed excessive redness and swelling in the area on her right forearm immediately distal to the elbow, states she was recently in the hospital and had an IV placed there.  Patient advised to go to the emergency room for further evaluation such as ultrasound to rule out thrombophlebitis and blood clot.   Theadora Rama Scales, PA-C 06/04/22 1346

## 2022-06-05 ENCOUNTER — Encounter (HOSPITAL_COMMUNITY): Payer: Self-pay

## 2022-06-05 ENCOUNTER — Emergency Department (HOSPITAL_COMMUNITY)
Admission: EM | Admit: 2022-06-05 | Discharge: 2022-06-05 | Disposition: A | Discharge status: Home / Self Care | Payer: 59 | Attending: Emergency Medicine | Admitting: Emergency Medicine

## 2022-06-05 ENCOUNTER — Emergency Department (HOSPITAL_BASED_OUTPATIENT_CLINIC_OR_DEPARTMENT_OTHER)
Admit: 2022-06-05 | Discharge: 2022-06-05 | Disposition: A | Discharge status: Home / Self Care | Payer: 59 | Attending: Emergency Medicine | Admitting: Emergency Medicine

## 2022-06-05 DIAGNOSIS — L03113 Cellulitis of right upper limb: Secondary | ICD-10-CM | POA: Insufficient documentation

## 2022-06-05 DIAGNOSIS — R609 Edema, unspecified: Secondary | ICD-10-CM

## 2022-06-05 DIAGNOSIS — L0390 Cellulitis, unspecified: Secondary | ICD-10-CM

## 2022-06-05 DIAGNOSIS — M79609 Pain in unspecified limb: Secondary | ICD-10-CM | POA: Diagnosis not present

## 2022-06-05 DIAGNOSIS — M79601 Pain in right arm: Secondary | ICD-10-CM | POA: Diagnosis present

## 2022-06-05 MED ORDER — SULFAMETHOXAZOLE-TRIMETHOPRIM 800-160 MG PO TABS: 1.0000 | ORAL_TABLET | Freq: Two times a day (BID) | ORAL | 0 refills | Status: AC

## 2022-06-05 MED ORDER — SULFAMETHOXAZOLE-TRIMETHOPRIM 800-160 MG PO TABS
1.0000 | ORAL_TABLET | Freq: Once | ORAL | Status: AC
  Administered 2022-06-05: 1 via ORAL
  Filled 2022-06-05: qty 1

## 2022-06-05 NOTE — Progress Notes (Signed)
Right upper extremity venous duplex has been completed. Preliminary results can be found in CV Proc through chart review.  Results were given to Dr. Rodena Medin.  06/05/22 11:07 AM Olen Cordial RVT

## 2022-06-05 NOTE — ED Triage Notes (Signed)
Pt arrived via POV, c/o right arm pain. States swollen and painful at recent IV site.

## 2022-06-05 NOTE — Discharge Instructions (Signed)
Return for any problem.   Take all of prescribed antibiotics as prescribed.  

## 2022-06-05 NOTE — ED Provider Notes (Signed)
[facility]-EMERGENCY DEPT Provider Note   CSN: [id number] Arrival date & time: 06/05/22  1017     History  Chief Complaint  Patient presents with   Arm Pain    [name] is a 66 y.o. female.  66 year old female with prior medical history detailed below presents for evaluation.  Patient complains of pain and redness and swelling at the site of IV that was placed in the right antecubital area approximately 2 weeks ago when she was admitted.  Patient complains of persistent pain and redness and swelling at the site.  She reports the pain is shooting up her arm to her shoulder.  She denies fever.  She denies progression of the erythema.  She denies other complaint.  The history is provided by the patient and medical records.       Home Medications Prior to Admission medications   Medication Sig Start Date End Date Taking? Authorizing Provider  sulfamethoxazole-trimethoprim (BACTRIM DS) 800-160 MG tablet Take 1 tablet by mouth 2 (two) times daily for 7 days. 06/05/22 06/12/22 Yes Wynetta Fines, MD  acetaminophen (TYLENOL) 325 MG tablet Take 2 tablets (650 mg total) by mouth every 4 (four) hours as needed for fever, mild pain, moderate pain or headache. 05/23/22   Tyrone Nine, MD  FLUoxetine (PROZAC) 20 MG capsule Take 1 capsule (20 mg total) by mouth daily. 04/07/22 07/25/22  Doran Stabler, DO  gabapentin (NEURONTIN) 100 MG capsule Take 1 capsule (100 mg total) by mouth 3 (three) times daily. 04/07/22 07/25/22  Doran Stabler, DO  levETIRAcetam (KEPPRA) 500 MG tablet Take 1 tablet (500 mg total) by mouth 2 (two) times daily. 05/23/22 06/22/22  Tyrone Nine, MD  naltrexone (DEPADE) 50 MG tablet Take 50 mg by mouth daily.    [provider]  PHENObarbital (LUMINAL) 60 MG tablet Take 1 tablet (60 mg total) by mouth at bedtime for 2 days. 05/23/22 05/25/22  Tyrone Nine, MD  phenytoin (DILANTIN) 100 MG ER capsule Take 1 capsule (100 mg total) by mouth 3 (three)  times daily. 05/23/22   Tyrone Nine, MD  thiamine (VITAMIN B1) 100 MG tablet Take 1 tablet (100 mg total) by mouth daily. 05/24/22   Tyrone Nine, MD  DULoxetine (CYMBALTA) 30 MG capsule Take 1 capsule (30 mg total) by mouth daily. Patient not taking: Reported on 09/01/2020 06/11/20 09/02/20  Money, [name], FNP  metoCLOPramide (REGLAN) 10 MG tablet Take 1 tablet (10 mg total) by mouth every 6 (six) hours as needed for nausea (nausea/headache). 04/06/19 05/24/19  Mesner, Barbara Cower, MD      Allergies    Keflex [cephalexin], Cephalosporins, Prednisone, Toradol [ketorolac tromethamine], and Ultram [tramadol]    Review of Systems   Review of Systems  All other systems reviewed and are negative.   Physical Exam Updated Vital Signs BP (!) 144/87 (BP Location: Left Arm)   Pulse 83   Temp 98.1 F (36.7 C) (Oral)   Resp 17   SpO2 100%  Physical Exam Vitals and nursing note reviewed.  Constitutional:      General: She is not in acute distress.    Appearance: Normal appearance. She is well-developed.  HENT:     Head: Normocephalic and atraumatic.  Eyes:     Conjunctiva/sclera: Conjunctivae normal.     Pupils: Pupils are equal, round, and reactive to light.  Cardiovascular:     Rate and Rhythm: Normal rate and regular rhythm.     Heart sounds: Normal  heart sounds.  Pulmonary:     Effort: Pulmonary effort is normal. No respiratory distress.     Breath sounds: Normal breath sounds.  Abdominal:     General: There is no distension.     Palpations: Abdomen is soft.     Tenderness: There is no abdominal tenderness.  Musculoskeletal:        General: No deformity. Normal range of motion.     Cervical back: Normal range of motion and neck supple.  Skin:    General: Skin is warm and dry.     Comments: Erythema to right antecubital fossa as noted.  See imaging below.  Area is nonfluctuant and firm.  Neurological:     General: No focal deficit present.     Mental Status: She is alert and  oriented to person, place, and time.      ED Results / Procedures / Treatments   Labs (all labs ordered are listed, but only abnormal results are displayed) Labs Reviewed - No data to display  EKG None  Radiology UE Venous Duplex ([facility] and [facility] ONLY)  Result Date: 06/05/2022 UPPER VENOUS STUDY  Patient Name:  [name]  Date of Exam:   06/05/2022 Medical Rec #: [id number]       Accession #:    [id number] Date of Birth: Mar 13, 1956      Patient Gender: F Patient Age:   55 years Exam Location:  [facility] Procedure:      VAS [place] UPPER EXTREMITY VENOUS DUPLEX Referring Phys: Theron Arista Krystin Keeven --------------------------------------------------------------------------------  Indications: Edema, and Pain Risk Factors: None identified. Limitations: Poor ultrasound/tissue interface. Comparison Study: 05/22/2022 - Right:                   No evidence of deep vein thrombosis in the upper extremity.                   Findings                   consistent                   with acute superficial vein thrombosis involving the right                   cephalic vein.                    Left:                   No evidence of deep vein thrombosis in the upper extremity.                   Findings                   consistent                   with acute superficial vein thrombosis involving the left                   cephalic vein. Performing Technologist: Chanda Busing RVT  Examination Guidelines: A complete evaluation includes B-mode imaging, spectral Doppler, color Doppler, and power Doppler as needed of all accessible portions of each vessel. Bilateral testing is considered an integral part of a complete examination. Limited examinations for reoccurring indications may be performed as noted.  Right Findings: +----------+------------+---------+-----------+----------+-----------------+ RIGHT     CompressiblePhasicitySpontaneousProperties     Summary       +----------+------------+---------+-----------+----------+-----------------+  IJV           Full       Yes       Yes                                +----------+------------+---------+-----------+----------+-----------------+ Subclavian    Full       Yes       Yes                                +----------+------------+---------+-----------+----------+-----------------+ Axillary      Full       Yes       Yes                                +----------+------------+---------+-----------+----------+-----------------+ Brachial      Full       Yes       Yes                                +----------+------------+---------+-----------+----------+-----------------+ Radial        Full                                                    +----------+------------+---------+-----------+----------+-----------------+ Ulnar         Full                                                    +----------+------------+---------+-----------+----------+-----------------+ Cephalic      None                                  Age Indeterminate +----------+------------+---------+-----------+----------+-----------------+ Basilic       Full                                                    +----------+------------+---------+-----------+----------+-----------------+ Thrombus located in the cephalic vein is noted to only be in the median cubital fossa.  Left Findings: +----------+------------+---------+-----------+----------+-------+ LEFT      CompressiblePhasicitySpontaneousPropertiesSummary +----------+------------+---------+-----------+----------+-------+ Subclavian    Full       Yes       Yes                      +----------+------------+---------+-----------+----------+-------+  Summary:  Right: No evidence of deep vein thrombosis in the upper extremity. Findings consistent with age indeterminate superficial vein thrombosis involving the right cephalic vein.  Left: No evidence  of thrombosis in the subclavian.  *See table(s) above for measurements and observations.     Preliminary     Procedures Procedures    Medications Ordered in ED Medications  sulfamethoxazole-trimethoprim (BACTRIM DS) 800-160 MG per tablet 1 tablet (has no administration in time range)    ED Course/ Medical Decision Making/ A&P  Medical Decision Making Risk Prescription drug management.   Medical Screen Complete  This patient presented to the ED with complaint of painful area at right antecubital fossa at site of prior IV.  This complaint involves an extensive number of treatment options. The initial differential diagnosis includes, but is not limited to, thrombophlebitis, cellulitis, DVT, etc.  This presentation is: Acute, Self-Limited, Previously Undiagnosed, Uncertain Prognosis, and Complicated  Patient is presenting with complaint of pain and redness at site of IV that was placed approximately 2 weeks ago.  Patient's exam is most consistent with likely localized cellulitis.  There is no evidence on exam of continued abscess.  Imaging with ultrasound does not reveal evidence of abscess and or DVT in the right upper extremity.  Patient would benefit from course of antibiotics.  She is encouraged to closely follow-up in the outpatient setting.  Strict return precautions given and understood.  Additional history obtained:  External records from outside sources obtained and reviewed including prior ED visits and prior Inpatient records.    Imaging Studies ordered:  I ordered imaging studies including UE Duplex  I independently visualized and interpreted obtained imaging which showed no DVT I agree with the radiologist interpretation.  Medicines ordered:  I ordered medication including bactrim DS  for infection  Reevaluation of the patient after these medicines showed that the patient: improved  Problem List / ED Course:  Cellulitis versus  thrombophlebitis   Reevaluation:  After the interventions noted above, I reevaluated the patient and found that they have: improved  Disposition:  After consideration of the diagnostic results and the patients response to treatment, I feel that the patent would benefit from  close outpatient followup.          Final Clinical Impression(s) / ED Diagnoses Final diagnoses:  Cellulitis, unspecified cellulitis site    Rx / DC Orders ED Discharge Orders          Ordered    sulfamethoxazole-trimethoprim (BACTRIM DS) 800-160 MG tablet  2 times daily        06/05/22 1119              Wynetta Fines, MD 06/05/22 1128

## 2022-06-12 ENCOUNTER — Encounter (HOSPITAL_COMMUNITY): Payer: Self-pay | Admitting: Emergency Medicine

## 2022-06-12 ENCOUNTER — Other Ambulatory Visit: Payer: Self-pay

## 2022-06-12 ENCOUNTER — Emergency Department (HOSPITAL_COMMUNITY)
Admission: EM | Admit: 2022-06-12 | Discharge: 2022-06-12 | Disposition: A | Discharge status: Home / Self Care | Payer: 59 | Attending: Emergency Medicine | Admitting: Emergency Medicine

## 2022-06-12 DIAGNOSIS — I809 Phlebitis and thrombophlebitis of unspecified site: Secondary | ICD-10-CM | POA: Insufficient documentation

## 2022-06-12 DIAGNOSIS — M79601 Pain in right arm: Secondary | ICD-10-CM | POA: Diagnosis not present

## 2022-06-12 DIAGNOSIS — D72829 Elevated white blood cell count, unspecified: Secondary | ICD-10-CM | POA: Diagnosis not present

## 2022-06-12 DIAGNOSIS — R2231 Localized swelling, mass and lump, right upper limb: Secondary | ICD-10-CM | POA: Diagnosis present

## 2022-06-12 LAB — CBC WITH DIFFERENTIAL/PLATELET
Abs Immature Granulocytes: 0.01 10*3/uL (ref 0.00–0.07)
Basophils Absolute: 0 10*3/uL (ref 0.0–0.1)
Basophils Relative: 1 %
Eosinophils Absolute: 0.2 10*3/uL (ref 0.0–0.5)
Eosinophils Relative: 4 %
HCT: 39.2 % (ref 36.0–46.0)
Hemoglobin: 12.5 g/dL (ref 12.0–15.0)
Immature Granulocytes: 0 %
Lymphocytes Relative: 41 %
Lymphs Abs: 2 10*3/uL (ref 0.7–4.0)
MCH: 32.4 pg (ref 26.0–34.0)
MCHC: 31.9 g/dL (ref 30.0–36.0)
MCV: 101.6 fL — ABNORMAL HIGH (ref 80.0–100.0)
Monocytes Absolute: 0.4 10*3/uL (ref 0.1–1.0)
Monocytes Relative: 7 %
Neutro Abs: 2.2 10*3/uL (ref 1.7–7.7)
Neutrophils Relative %: 47 %
Platelets: 307 10*3/uL (ref 150–400)
RBC: 3.86 MIL/uL — ABNORMAL LOW (ref 3.87–5.11)
RDW: 14 % (ref 11.5–15.5)
WBC: 4.8 10*3/uL (ref 4.0–10.5)
nRBC: 0 % (ref 0.0–0.2)

## 2022-06-12 LAB — URINALYSIS, ROUTINE W REFLEX MICROSCOPIC
Bilirubin Urine: NEGATIVE
Glucose, UA: NEGATIVE mg/dL
Hgb urine dipstick: NEGATIVE
Ketones, ur: NEGATIVE mg/dL
Leukocytes,Ua: NEGATIVE
Nitrite: NEGATIVE
Protein, ur: NEGATIVE mg/dL
Specific Gravity, Urine: 1.003 — ABNORMAL LOW (ref 1.005–1.030)
pH: 6 (ref 5.0–8.0)

## 2022-06-12 LAB — COMPREHENSIVE METABOLIC PANEL
ALT: 8 U/L (ref 0–44)
AST: 13 U/L — ABNORMAL LOW (ref 15–41)
Albumin: 3.6 g/dL (ref 3.5–5.0)
Alkaline Phosphatase: 54 U/L (ref 38–126)
Anion gap: 8 (ref 5–15)
BUN: 7 mg/dL — ABNORMAL LOW (ref 8–23)
CO2: 22 mmol/L (ref 22–32)
Calcium: 9.2 mg/dL (ref 8.9–10.3)
Chloride: 110 mmol/L (ref 98–111)
Creatinine, Ser: 0.66 mg/dL (ref 0.44–1.00)
GFR, Estimated: >60 mL/min (ref >60)
Glucose, Bld: 90 mg/dL (ref 70–99)
Potassium: 3.7 mmol/L (ref 3.5–5.1)
Sodium: 140 mmol/L (ref 135–145)
Total Bilirubin: 0.4 mg/dL (ref 0.3–1.2)
Total Protein: 6.6 g/dL (ref 6.5–8.1)

## 2022-06-12 LAB — C-REACTIVE PROTEIN: CRP: 0.6 mg/dL (ref <1.0)

## 2022-06-12 LAB — SEDIMENTATION RATE: Sed Rate: 11 mm/hr (ref 0–22)

## 2022-06-12 NOTE — ED Notes (Addendum)
Pt taken to restroom via steady. Pt states "feeling weak and aching all over".

## 2022-06-12 NOTE — ED Provider Notes (Signed)
[facility] DEPT Provider Note   CSN: [id number] Arrival date & time: 06/12/22  1405     History Chief Complaint  Patient presents with   Wound Infection    HPI [name] is a 66 y.o. female presenting for chief complaint of ongoing right arm swelling and pain.  She endorses that she had thrombophlebitis at the site complicated by cellulitis.  She has finished a 5-day course of antibiotics but has had recurrence of the pain and swelling at the site.  She endorses subjective fevers denies chills.  Patient denies nausea vomiting syncope or shortness of breath.  Compared to past picture, site does appear grossly improved.  She has other diffuse concerns including fatigue, lethargy, poor p.o. intake.  Notably she is ambulatory and tolerating p.o. intake at this time.   Patient's recorded medical, surgical, social, medication list and allergies were reviewed in the Snapshot window as part of the initial history.   Review of Systems   Review of Systems  Constitutional:  Negative for chills and fever.  HENT:  Negative for ear pain and sore throat.   Eyes:  Negative for pain and visual disturbance.  Respiratory:  Negative for cough and shortness of breath.   Cardiovascular:  Negative for chest pain and palpitations.  Gastrointestinal:  Negative for abdominal pain and vomiting.  Genitourinary:  Negative for dysuria and hematuria.  Musculoskeletal:  Negative for arthralgias and back pain.  Skin:  Positive for rash. Negative for color change.  Neurological:  Negative for seizures and syncope.  All other systems reviewed and are negative.   Physical Exam Updated Vital Signs BP 130/84   Pulse 81   Temp 97.6 F (36.4 C) (Oral)   Resp 18   Ht 5' 1"  (1.549 m)   Wt 56.7 kg   SpO2 97%   BMI 23.62 kg/m  Physical Exam Vitals and nursing note reviewed.  Constitutional:      General: She is not in acute distress.    Appearance: She is well-developed.   HENT:     Head: Normocephalic and atraumatic.  Eyes:     Conjunctiva/sclera: Conjunctivae normal.  Cardiovascular:     Rate and Rhythm: Normal rate and regular rhythm.     Heart sounds: No murmur heard. Pulmonary:     Effort: Pulmonary effort is normal. No respiratory distress.     Breath sounds: Normal breath sounds.  Abdominal:     General: There is no distension.     Palpations: Abdomen is soft.     Tenderness: There is no abdominal tenderness. There is no right CVA tenderness or left CVA tenderness.  Musculoskeletal:        General: No swelling or tenderness. Normal range of motion.     Cervical back: Neck supple.  Skin:    General: Skin is warm and dry.     Findings: Erythema and lesion present.  Neurological:     General: No focal deficit present.     Mental Status: She is alert and oriented to person, place, and time. Mental status is at baseline.     Cranial Nerves: No cranial nerve deficit.       ED Course/ Medical Decision Making/ A&P    Procedures Procedures   Medications Ordered in ED Medications - No data to display  Medical Decision Making:    [name] is a 66 y.o. female who presented to the ED today with right arm antecubital swelling detailed above.  Additional history discussed with patient's family/caregivers.  Patient's presentation is complicated by their history of multiple ongoing medical problems.  Patient placed on continuous vitals and telemetry monitoring while in ED which was reviewed periodically.   Complete initial physical exam performed, notably the patient  was HDS in NAD. Patient has no focal pathology.       Reviewed and confirmed nursing documentation for past medical history, family history, social history.    Initial Assessment:   With the patient's presentation of recurrent right forearm swelling, most likely diagnosis is cellulitis versus thrombophlebitis. Other diagnoses were considered including (but not limited  to) necrotizing fasciitis, recurrent DVT. These are considered less likely due to history of present illness and physical exam findings.   This is most consistent with an acute life/limb threatening illness complicated by underlying chronic conditions.  Initial Plan:  Evaluation for systemic infection with inflammatory markers including ESR CRP. Screening labs including CBC and Metabolic panel to evaluate for infectious or metabolic etiology of disease.  EKG to evaluate for cardiac pathology. Objective evaluation as below reviewed with plan for close reassessment  Initial Study Results:   Laboratory  All laboratory results reviewed without evidence of clinically relevant pathology.    Final Assessment and Plan:   Likely continuing thrombophlebitis and is slowly improving. Patient is overall well appearing and ambulatory and tolerating PO intake.  Uncertain underlying etiology but no evidence of systemic inflammation or systemic infection with normal ESR and white blood cell count.  CRP will present to other state laboratory and result not available at this time.  However patient overall well-appearing and stable for continued outpatient care management.  No indication for further antibiotics as this does not appear to be cellulitis.  Disposition:  Based on the above findings, I believe patient is stable for discharge.    Patient/family educated about specific return precautions for given chief complaint and symptoms.  Patient/family educated about follow-up with PCP.     Patient/family expressed understanding of return precautions and need for follow-up. Patient spoken to regarding all imaging and laboratory results and appropriate follow up for these results. All education provided in verbal form with additional information in written form. Time was allowed for answering of patient questions. Patient discharged.    Emergency Department Medication Summary:   Medications - No data to display      .    Clinical Impression:  1. Thrombophlebitis      Discharge   Final Clinical Impression(s) / ED Diagnoses Final diagnoses:  Thrombophlebitis    Rx / DC Orders ED Discharge Orders     None         Tretha Sciara, MD 06/12/22 1642

## 2022-06-12 NOTE — ED Triage Notes (Signed)
Pt to ER via EMS from home with c/o continued redness, swelling and pain to R AC previous IV site despite PO abx.

## 2022-08-12 ENCOUNTER — Emergency Department (HOSPITAL_COMMUNITY)
Admission: EM | Admit: 2022-08-12 | Discharge: 2022-08-13 | Disposition: A | Discharge status: Home / Self Care | Payer: 59 | Attending: Emergency Medicine | Admitting: Emergency Medicine

## 2022-08-12 DIAGNOSIS — G40909 Epilepsy, unspecified, not intractable, without status epilepticus: Secondary | ICD-10-CM | POA: Diagnosis present

## 2022-08-12 DIAGNOSIS — R569 Unspecified convulsions: Secondary | ICD-10-CM

## 2022-08-12 DIAGNOSIS — R000 Tachycardia, unspecified: Secondary | ICD-10-CM | POA: Diagnosis not present

## 2022-08-12 LAB — CBC WITH DIFFERENTIAL/PLATELET
Abs Immature Granulocytes: 0.03 10*3/uL (ref 0.00–0.07)
Basophils Absolute: 0 10*3/uL (ref 0.0–0.1)
Basophils Relative: 1 %
Eosinophils Absolute: 0 10*3/uL (ref 0.0–0.5)
Eosinophils Relative: 1 %
HCT: 35.8 % — ABNORMAL LOW (ref 36.0–46.0)
Hemoglobin: 11.7 g/dL — ABNORMAL LOW (ref 12.0–15.0)
Immature Granulocytes: 1 %
Lymphocytes Relative: 25 %
Lymphs Abs: 1.3 10*3/uL (ref 0.7–4.0)
MCH: 32.2 pg (ref 26.0–34.0)
MCHC: 32.7 g/dL (ref 30.0–36.0)
MCV: 98.6 fL (ref 80.0–100.0)
Monocytes Absolute: 0.5 10*3/uL (ref 0.1–1.0)
Monocytes Relative: 9 %
Neutro Abs: 3.6 10*3/uL (ref 1.7–7.7)
Neutrophils Relative %: 63 %
Platelets: 207 10*3/uL (ref 150–400)
RBC: 3.63 MIL/uL — ABNORMAL LOW (ref 3.87–5.11)
RDW: 14.1 % (ref 11.5–15.5)
WBC: 5.5 10*3/uL (ref 4.0–10.5)
nRBC: 0 % (ref 0.0–0.2)

## 2022-08-12 LAB — COMPREHENSIVE METABOLIC PANEL
ALT: 15 U/L (ref 0–44)
AST: 24 U/L (ref 15–41)
Albumin: 3.2 g/dL — ABNORMAL LOW (ref 3.5–5.0)
Alkaline Phosphatase: 49 U/L (ref 38–126)
Anion gap: 9 (ref 5–15)
BUN: 6 mg/dL — ABNORMAL LOW (ref 8–23)
CO2: 19 mmol/L — ABNORMAL LOW (ref 22–32)
Calcium: 7.8 mg/dL — ABNORMAL LOW (ref 8.9–10.3)
Chloride: 109 mmol/L (ref 98–111)
Creatinine, Ser: 0.61 mg/dL (ref 0.44–1.00)
GFR, Estimated: >60 mL/min (ref >60)
Glucose, Bld: 104 mg/dL — ABNORMAL HIGH (ref 70–99)
Potassium: 2.8 mmol/L — ABNORMAL LOW (ref 3.5–5.1)
Sodium: 137 mmol/L (ref 135–145)
Total Bilirubin: 0.4 mg/dL (ref 0.3–1.2)
Total Protein: 5.5 g/dL — ABNORMAL LOW (ref 6.5–8.1)

## 2022-08-12 LAB — PHENYTOIN LEVEL, TOTAL: Phenytoin Lvl: <2.5 ug/mL — ABNORMAL LOW (ref 10.0–20.0)

## 2022-08-12 LAB — CBG MONITORING, ED: Glucose-Capillary: 156 mg/dL — ABNORMAL HIGH (ref 70–99)

## 2022-08-12 MED ORDER — PHENOBARBITAL 32.4 MG PO TABS
32.4000 mg | ORAL_TABLET | Freq: Once | ORAL | Status: AC
  Administered 2022-08-12: 32.4 mg via ORAL
  Filled 2022-08-12: qty 1

## 2022-08-12 MED ORDER — ACETAMINOPHEN 325 MG PO TABS
650.0000 mg | ORAL_TABLET | Freq: Once | ORAL | Status: AC
  Administered 2022-08-12: 650 mg via ORAL
  Filled 2022-08-12: qty 2

## 2022-08-12 MED ORDER — POTASSIUM CHLORIDE CRYS ER 20 MEQ PO TBCR
40.0000 meq | EXTENDED_RELEASE_TABLET | Freq: Once | ORAL | Status: AC
  Administered 2022-08-13: 40 meq via ORAL
  Filled 2022-08-12: qty 2

## 2022-08-12 MED ORDER — SODIUM CHLORIDE 0.9 % IV SOLN
1000.0000 mg | Freq: Once | INTRAVENOUS | Status: AC
  Administered 2022-08-12: 1000 mg via INTRAVENOUS
  Filled 2022-08-12: qty 20

## 2022-08-12 MED ORDER — CALCIUM CARBONATE 1250 (500 CA) MG PO TABS
1.0000 | ORAL_TABLET | Freq: Once | ORAL | Status: AC
  Administered 2022-08-13: 1250 mg via ORAL
  Filled 2022-08-12: qty 1

## 2022-08-12 MED ORDER — LACTATED RINGERS IV SOLN: INTRAVENOUS | Status: DC

## 2022-08-12 NOTE — ED Notes (Signed)
Ambulated to bathroom

## 2022-08-12 NOTE — Discharge Instructions (Addendum)
Take your seizure medications as directed

## 2022-08-12 NOTE — ED Triage Notes (Addendum)
Pt brought in EMS for seizures, is non compliant with Lamictal, history of TBI. Pt was post-ictal when EMS arrived, pt had seizure upon arrival to ED, 5mg  Versed administered IM.

## 2022-08-12 NOTE — ED Provider Notes (Signed)
[facility]-EMERGENCY DEPT Provider Note   CSN: [id number] Arrival date & time: 08/12/22  1603     History  Chief Complaint  Patient presents with   Seizures    [name] is a 66 y.o. female.  Six 38-year-old female with history of seizure disorder presents after having a witnessed seizure just prior to arrival.  Patient came via EMS.  Reported history of noncompliance with her antiepileptics.  Postictal on arrival here.  Had received 5 mg of Versed IM.  No further history obtainable due to her current state       Home Medications Prior to Admission medications   Medication Sig Start Date End Date Taking? Authorizing Provider  acetaminophen (TYLENOL) 325 MG tablet Take 2 tablets (650 mg total) by mouth every 4 (four) hours as needed for fever, mild pain, moderate pain or headache. 05/23/22   Tyrone Nine, MD  FLUoxetine (PROZAC) 20 MG capsule Take 1 capsule (20 mg total) by mouth daily. 04/07/22 07/25/22  Doran Stabler, DO  gabapentin (NEURONTIN) 100 MG capsule Take 1 capsule (100 mg total) by mouth 3 (three) times daily. 04/07/22 07/25/22  Doran Stabler, DO  levETIRAcetam (KEPPRA) 500 MG tablet Take 1 tablet (500 mg total) by mouth 2 (two) times daily. 05/23/22 06/22/22  Tyrone Nine, MD  naltrexone (DEPADE) 50 MG tablet Take 50 mg by mouth daily.    [provider]  PHENObarbital (LUMINAL) 60 MG tablet Take 1 tablet (60 mg total) by mouth at bedtime for 2 days. 05/23/22 05/25/22  Tyrone Nine, MD  phenytoin (DILANTIN) 100 MG ER capsule Take 1 capsule (100 mg total) by mouth 3 (three) times daily. 05/23/22   Tyrone Nine, MD  thiamine (VITAMIN B1) 100 MG tablet Take 1 tablet (100 mg total) by mouth daily. 05/24/22   Tyrone Nine, MD  DULoxetine (CYMBALTA) 30 MG capsule Take 1 capsule (30 mg total) by mouth daily. Patient not taking: Reported on 09/01/2020 06/11/20 09/02/20  Money, [name], FNP  metoCLOPramide (REGLAN) 10 MG tablet Take 1 tablet (10 mg total) by  mouth every 6 (six) hours as needed for nausea (nausea/headache). 04/06/19 05/24/19  Mesner, Barbara Cower, MD      Allergies    Keflex [cephalexin], Cephalosporins, Prednisone, Toradol [ketorolac tromethamine], and Ultram [tramadol]    Review of Systems   Review of Systems  Unable to perform ROS: Mental status change    Physical Exam Updated Vital Signs BP 134/87 (BP Location: Left Arm)   Pulse (!) 119   Temp 98.2 F (36.8 C) (Oral)   Resp (!) 22   SpO2 98%  Physical Exam Vitals and nursing note reviewed.  Constitutional:      General: She is not in acute distress.    Appearance: Normal appearance. She is well-developed. She is not toxic-appearing.  HENT:     Head: Normocephalic and atraumatic.  Eyes:     General: Lids are normal.     Conjunctiva/sclera: Conjunctivae normal.     Pupils: Pupils are equal, round, and reactive to light.  Neck:     Thyroid: No thyroid mass.     Trachea: No tracheal deviation.  Cardiovascular:     Rate and Rhythm: Normal rate and regular rhythm.     Heart sounds: Normal heart sounds. No murmur heard.    No gallop.  Pulmonary:     Effort: Pulmonary effort is normal. No respiratory distress.     Breath sounds: Normal breath sounds. No stridor.  No decreased breath sounds, wheezing, rhonchi or rales.  Abdominal:     General: There is no distension.     Palpations: Abdomen is soft.     Tenderness: There is no abdominal tenderness. There is no rebound.  Musculoskeletal:        General: No tenderness. Normal range of motion.     Cervical back: Normal range of motion and neck supple.  Skin:    General: Skin is warm and dry.     Findings: No abrasion or rash.  Neurological:     General: No focal deficit present.     Mental Status: She is alert. She is disoriented and confused.     GCS: GCS eye subscore is 4. GCS verbal subscore is 4. GCS motor subscore is 6.     Cranial Nerves: No cranial nerve deficit.     Sensory: No sensory deficit.     Motor:  Motor function is intact. No weakness or tremor.  Psychiatric:        Attention and Perception: She is inattentive.        Speech: Speech is slurred.     ED Results / Procedures / Treatments   Labs (all labs ordered are listed, but only abnormal results are displayed) Labs Reviewed  CBG MONITORING, ED - Abnormal; Notable for the following components:      Result Value   Glucose-Capillary 156 (*)    All other components within normal limits  CBC WITH DIFFERENTIAL/PLATELET  COMPREHENSIVE METABOLIC PANEL  PHENYTOIN LEVEL, TOTAL  PHENOBARBITAL LEVEL    EKG EKG Interpretation  Date/Time:  Wednesday August 12 2022 16:57:55 EDT Ventricular Rate:  116 PR Interval:  174 QRS Duration: 91 QT Interval:  326 QTC Calculation: 453 R Axis:   -67 Text Interpretation: Sinus tachycardia Left anterior fascicular block Abnormal R-wave progression, late transition Confirmed by Lacretia Leigh ([id number]) on 08/12/2022 10:32:46 PM  Radiology No results found.  Procedures Procedures    Medications Ordered in ED Medications  lactated ringers infusion (has no administration in time range)    ED Course/ Medical Decision Making/ A&P                           Medical Decision Making Amount and/or Complexity of Data Reviewed Labs: ordered. ECG/medicine tests: ordered.  Risk OTC drugs. Prescription drug management.   Patient is EKG per interpretation shows sinus tachycardia.  Patient profoundly postictal here.  No observed seizure activity here.  Was monitored and her sensorium gradually improved.  Her tachycardia is also resolved and now she is in sinus rhythm at a rate of 73 per rhythm strip interpretation.  Patient admits now that she has not taken her phenobarbital or her Dilantin due to medication side effects.  Patient will be given 1 g of Dilantin here along with an oral dose of phenobarbital.  Electrolytes are pending at this time.  Anticipate that she will be discharged home with  follow-up with her doctor.  We will sign off next provider   CRITICAL CARE Performed by: Leota Jacobsen Total critical care time: 66 minutes Critical care time was exclusive of separately billable procedures and treating other patients. Critical care was necessary to treat or prevent imminent or life-threatening deterioration. Critical care was time spent personally by me on the following activities: development of treatment plan with patient and/or surrogate as well as nursing, discussions with consultants, evaluation of patient's response to treatment, examination of patient, obtaining  history from patient or surrogate, ordering and performing treatments and interventions, ordering and review of laboratory studies, ordering and review of radiographic studies, pulse oximetry and re-evaluation of patient's condition.         Final Clinical Impression(s) / ED Diagnoses Final diagnoses:  None    Rx / DC Orders ED Discharge Orders     None         Lorre Nick, MD 08/12/22 2240

## 2022-08-12 NOTE — ED Notes (Signed)
Unsuccessful IV attempt x2.  

## 2022-08-12 NOTE — ED Notes (Signed)
Pt given coke, tolerating well 

## 2022-08-12 NOTE — ED Provider Notes (Signed)
  Provider Note MRN:  [id number]  Arrival date & time: 08/12/22    ED Course and Medical Decision Making  Assumed care from Dr. Zenia Resides at shift change.  Seizure, noncompliant with meds, plan is to load with Dilantin, phenobarbital, discharge after CMP.  11:54 PM update: CMP revealing mild hypokalemia and hypocalcemia, repleted here in the emergency department.  Advised further repletion with balanced diet.  Appropriate for discharge.  Procedures  Final Clinical Impressions(s) / ED Diagnoses     ICD-10-CM   1. Seizure ([place])  R56.9       ED Discharge Orders     None         Discharge Instructions      Take your seizure medications as directed    Barth Kirks. [place]onia Small, [place] [email or web address]    Maudie Flakes, MD 08/12/22 [phone]

## 2022-08-13 DIAGNOSIS — G40909 Epilepsy, unspecified, not intractable, without status epilepticus: Secondary | ICD-10-CM | POA: Diagnosis not present

## 2022-08-13 LAB — PHENOBARBITAL LEVEL: Phenobarbital: <5 ug/mL — ABNORMAL LOW (ref 15.0–40.0)

## 2022-08-13 NOTE — ED Notes (Signed)
This RN called pt's listed boyfriend (no answer), and sister to attempt for transport home. Per sister, she cannot come to get her, and her boyfriend cannot drive her home at this time

## 2022-08-25 ENCOUNTER — Inpatient Hospital Stay: Payer: 59 | Admitting: Family Medicine

## 2022-09-21 ENCOUNTER — Inpatient Hospital Stay: Payer: 59 | Admitting: Family Medicine

## 2022-09-23 ENCOUNTER — Inpatient Hospital Stay: Payer: 59 | Admitting: Family Medicine

## 2022-10-17 ENCOUNTER — Inpatient Hospital Stay (HOSPITAL_COMMUNITY)
Admission: EM | Admit: 2022-10-17 | Discharge: 2022-10-21 | DRG: 100 | Disposition: A | Discharge status: Home / Self Care | Payer: 59 | Attending: Internal Medicine | Admitting: Internal Medicine

## 2022-10-17 ENCOUNTER — Emergency Department (HOSPITAL_COMMUNITY): Payer: 59

## 2022-10-17 ENCOUNTER — Other Ambulatory Visit: Payer: Self-pay

## 2022-10-17 ENCOUNTER — Encounter (HOSPITAL_COMMUNITY): Payer: Self-pay | Admitting: Internal Medicine

## 2022-10-17 DIAGNOSIS — F1020 Alcohol dependence, uncomplicated: Secondary | ICD-10-CM | POA: Diagnosis present

## 2022-10-17 DIAGNOSIS — G9341 Metabolic encephalopathy: Secondary | ICD-10-CM | POA: Diagnosis present

## 2022-10-17 DIAGNOSIS — Z981 Arthrodesis status: Secondary | ICD-10-CM

## 2022-10-17 DIAGNOSIS — Z91148 Patient's other noncompliance with medication regimen for other reason: Secondary | ICD-10-CM | POA: Diagnosis not present

## 2022-10-17 DIAGNOSIS — G928 Other toxic encephalopathy: Secondary | ICD-10-CM | POA: Diagnosis present

## 2022-10-17 DIAGNOSIS — G40901 Epilepsy, unspecified, not intractable, with status epilepticus: Secondary | ICD-10-CM | POA: Diagnosis not present

## 2022-10-17 DIAGNOSIS — E162 Hypoglycemia, unspecified: Secondary | ICD-10-CM | POA: Diagnosis present

## 2022-10-17 DIAGNOSIS — R569 Unspecified convulsions: Secondary | ICD-10-CM

## 2022-10-17 DIAGNOSIS — F17210 Nicotine dependence, cigarettes, uncomplicated: Secondary | ICD-10-CM | POA: Diagnosis present

## 2022-10-17 DIAGNOSIS — Z881 Allergy status to other antibiotic agents status: Secondary | ICD-10-CM

## 2022-10-17 DIAGNOSIS — Z79899 Other long term (current) drug therapy: Secondary | ICD-10-CM

## 2022-10-17 DIAGNOSIS — F10239 Alcohol dependence with withdrawal, unspecified: Secondary | ICD-10-CM | POA: Diagnosis present

## 2022-10-17 DIAGNOSIS — E875 Hyperkalemia: Secondary | ICD-10-CM | POA: Insufficient documentation

## 2022-10-17 DIAGNOSIS — Z885 Allergy status to narcotic agent status: Secondary | ICD-10-CM

## 2022-10-17 DIAGNOSIS — G8929 Other chronic pain: Secondary | ICD-10-CM | POA: Diagnosis present

## 2022-10-17 DIAGNOSIS — F32A Depression, unspecified: Secondary | ICD-10-CM | POA: Diagnosis present

## 2022-10-17 DIAGNOSIS — Z888 Allergy status to other drugs, medicaments and biological substances status: Secondary | ICD-10-CM

## 2022-10-17 DIAGNOSIS — E876 Hypokalemia: Secondary | ICD-10-CM | POA: Diagnosis present

## 2022-10-17 LAB — I-STAT VENOUS BLOOD GAS, ED
Acid-base deficit: 15 mmol/L — ABNORMAL HIGH (ref 0.0–2.0)
Bicarbonate: 14.5 mmol/L — ABNORMAL LOW (ref 20.0–28.0)
Calcium, Ion: 1.2 mmol/L (ref 1.15–1.40)
HCT: 47 % — ABNORMAL HIGH (ref 36.0–46.0)
Hemoglobin: 16 g/dL — ABNORMAL HIGH (ref 12.0–15.0)
O2 Saturation: 93 %
Potassium: 2.6 mmol/L — CL (ref 3.5–5.1)
Sodium: 133 mmol/L — ABNORMAL LOW (ref 135–145)
TCO2: 16 mmol/L — ABNORMAL LOW (ref 22–32)
pCO2, Ven: 44.9 mmHg (ref 44–60)
pH, Ven: 7.118 — CL (ref 7.25–7.43)
pO2, Ven: 89 mmHg — ABNORMAL HIGH (ref 32–45)

## 2022-10-17 LAB — CBC WITH DIFFERENTIAL/PLATELET
Abs Immature Granulocytes: 0.07 10*3/uL (ref 0.00–0.07)
Basophils Absolute: 0 10*3/uL (ref 0.0–0.1)
Basophils Relative: 0 %
Eosinophils Absolute: 0 10*3/uL (ref 0.0–0.5)
Eosinophils Relative: 0 %
HCT: 44.9 % (ref 36.0–46.0)
Hemoglobin: 14.4 g/dL (ref 12.0–15.0)
Immature Granulocytes: 1 %
Lymphocytes Relative: 8 %
Lymphs Abs: 0.8 10*3/uL (ref 0.7–4.0)
MCH: 33.3 pg (ref 26.0–34.0)
MCHC: 32.1 g/dL (ref 30.0–36.0)
MCV: 103.9 fL — ABNORMAL HIGH (ref 80.0–100.0)
Monocytes Absolute: 0.7 10*3/uL (ref 0.1–1.0)
Monocytes Relative: 7 %
Neutro Abs: 8.9 10*3/uL — ABNORMAL HIGH (ref 1.7–7.7)
Neutrophils Relative %: 84 %
Platelets: 260 10*3/uL (ref 150–400)
RBC: 4.32 MIL/uL (ref 3.87–5.11)
RDW: 14.5 % (ref 11.5–15.5)
WBC: 10.5 10*3/uL (ref 4.0–10.5)
nRBC: 0 % (ref 0.0–0.2)

## 2022-10-17 LAB — BASIC METABOLIC PANEL
Anion gap: 10 (ref 5–15)
BUN: 5 mg/dL — ABNORMAL LOW (ref 8–23)
CO2: 23 mmol/L (ref 22–32)
Calcium: 8.8 mg/dL — ABNORMAL LOW (ref 8.9–10.3)
Chloride: 101 mmol/L (ref 98–111)
Creatinine, Ser: 0.71 mg/dL (ref 0.44–1.00)
GFR, Estimated: >60 mL/min (ref >60)
Glucose, Bld: 112 mg/dL — ABNORMAL HIGH (ref 70–99)
Potassium: 3.2 mmol/L — ABNORMAL LOW (ref 3.5–5.1)
Sodium: 134 mmol/L — ABNORMAL LOW (ref 135–145)

## 2022-10-17 LAB — I-STAT CHEM 8, ED
BUN: 6 mg/dL — ABNORMAL LOW (ref 8–23)
Calcium, Ion: 1.19 mmol/L (ref 1.15–1.40)
Chloride: 100 mmol/L (ref 98–111)
Creatinine, Ser: 0.7 mg/dL (ref 0.44–1.00)
Glucose, Bld: 288 mg/dL — ABNORMAL HIGH (ref 70–99)
HCT: 47 % — ABNORMAL HIGH (ref 36.0–46.0)
Hemoglobin: 16 g/dL — ABNORMAL HIGH (ref 12.0–15.0)
Potassium: 2.6 mmol/L — CL (ref 3.5–5.1)
Sodium: 134 mmol/L — ABNORMAL LOW (ref 135–145)
TCO2: 17 mmol/L — ABNORMAL LOW (ref 22–32)

## 2022-10-17 LAB — RAPID URINE DRUG SCREEN, HOSP PERFORMED
Amphetamines: NOT DETECTED
Barbiturates: NOT DETECTED
Benzodiazepines: POSITIVE — AB
Cocaine: NOT DETECTED
Opiates: NOT DETECTED
Tetrahydrocannabinol: NOT DETECTED

## 2022-10-17 LAB — ETHANOL: Alcohol, Ethyl (B): <10 mg/dL (ref <10)

## 2022-10-17 LAB — COMPREHENSIVE METABOLIC PANEL
ALT: 16 U/L (ref 0–44)
AST: 43 U/L — ABNORMAL HIGH (ref 15–41)
Albumin: 4.2 g/dL (ref 3.5–5.0)
Alkaline Phosphatase: 88 U/L (ref 38–126)
Anion gap: 19 — ABNORMAL HIGH (ref 5–15)
BUN: 5 mg/dL — ABNORMAL LOW (ref 8–23)
CO2: 16 mmol/L — ABNORMAL LOW (ref 22–32)
Calcium: 10 mg/dL (ref 8.9–10.3)
Chloride: 101 mmol/L (ref 98–111)
Creatinine, Ser: 0.9 mg/dL (ref 0.44–1.00)
GFR, Estimated: >60 mL/min (ref >60)
Glucose, Bld: 238 mg/dL — ABNORMAL HIGH (ref 70–99)
Potassium: 3 mmol/L — ABNORMAL LOW (ref 3.5–5.1)
Sodium: 136 mmol/L (ref 135–145)
Total Bilirubin: 0.2 mg/dL — ABNORMAL LOW (ref 0.3–1.2)
Total Protein: 7.7 g/dL (ref 6.5–8.1)

## 2022-10-17 LAB — URINALYSIS, ROUTINE W REFLEX MICROSCOPIC
Bilirubin Urine: NEGATIVE
Glucose, UA: 50 mg/dL — AB
Ketones, ur: NEGATIVE mg/dL
Leukocytes,Ua: NEGATIVE
Nitrite: NEGATIVE
Protein, ur: 30 mg/dL — AB
Specific Gravity, Urine: 1.01 (ref 1.005–1.030)
pH: 6 (ref 5.0–8.0)

## 2022-10-17 LAB — CBC
HCT: 38.6 % (ref 36.0–46.0)
Hemoglobin: 13 g/dL (ref 12.0–15.0)
MCH: 33.6 pg (ref 26.0–34.0)
MCHC: 33.7 g/dL (ref 30.0–36.0)
MCV: 99.7 fL (ref 80.0–100.0)
Platelets: 242 10*3/uL (ref 150–400)
RBC: 3.87 MIL/uL (ref 3.87–5.11)
RDW: 14.3 % (ref 11.5–15.5)
WBC: 8.9 10*3/uL (ref 4.0–10.5)
nRBC: 0 % (ref 0.0–0.2)

## 2022-10-17 LAB — CBG MONITORING, ED: Glucose-Capillary: 288 mg/dL — ABNORMAL HIGH (ref 70–99)

## 2022-10-17 LAB — MAGNESIUM: Magnesium: 1.9 mg/dL (ref 1.7–2.4)

## 2022-10-17 LAB — PHENOBARBITAL LEVEL: Phenobarbital: <5 ug/mL — ABNORMAL LOW (ref 15.0–40.0)

## 2022-10-17 LAB — CK: Total CK: 465 U/L — ABNORMAL HIGH (ref 38–234)

## 2022-10-17 LAB — LACTIC ACID, PLASMA: Lactic Acid, Venous: 2.7 mmol/L (ref 0.5–1.9)

## 2022-10-17 LAB — GLUCOSE, CAPILLARY: Glucose-Capillary: 90 mg/dL (ref 70–99)

## 2022-10-17 LAB — BETA-HYDROXYBUTYRIC ACID: Beta-Hydroxybutyric Acid: 0.37 mmol/L — ABNORMAL HIGH (ref 0.05–0.27)

## 2022-10-17 LAB — PHENYTOIN LEVEL, TOTAL: Phenytoin Lvl: <2.5 ug/mL — ABNORMAL LOW (ref 10.0–20.0)

## 2022-10-17 MED ORDER — LORAZEPAM 2 MG/ML IJ SOLN
1.0000 mg | INTRAMUSCULAR | Status: DC | PRN
  Administered 2022-10-17: 3 mg via INTRAVENOUS
  Administered 2022-10-17 – 2022-10-18 (×2): 2 mg via INTRAVENOUS
  Filled 2022-10-17: qty 2
  Filled 2022-10-17 (×2): qty 1

## 2022-10-17 MED ORDER — SODIUM CHLORIDE 0.9 % IV BOLUS
1000.0000 mL | Freq: Once | INTRAVENOUS | Status: AC
  Administered 2022-10-17: 1000 mL via INTRAVENOUS

## 2022-10-17 MED ORDER — FOLIC ACID 1 MG PO TABS
1.0000 mg | ORAL_TABLET | Freq: Every day | ORAL | Status: DC
  Administered 2022-10-18 – 2022-10-21 (×4): 1 mg via ORAL
  Filled 2022-10-17 (×4): qty 1

## 2022-10-17 MED ORDER — LORAZEPAM 1 MG PO TABS
1.0000 mg | ORAL_TABLET | ORAL | Status: DC | PRN
  Administered 2022-10-19: 1 mg via ORAL
  Filled 2022-10-17: qty 1

## 2022-10-17 MED ORDER — SODIUM CHLORIDE 0.9 % IV SOLN
3000.0000 mg | INTRAVENOUS | Status: AC
  Administered 2022-10-17: 3000 mg via INTRAVENOUS
  Filled 2022-10-17: qty 30

## 2022-10-17 MED ORDER — ONDANSETRON HCL 4 MG/2ML IJ SOLN: 4.0000 mg | Freq: Four times a day (QID) | INTRAMUSCULAR | Status: DC | PRN

## 2022-10-17 MED ORDER — POTASSIUM CHLORIDE 10 MEQ/100ML IV SOLN
10.0000 meq | INTRAVENOUS | Status: AC
  Administered 2022-10-17 (×2): 10 meq via INTRAVENOUS
  Filled 2022-10-17 (×2): qty 100

## 2022-10-17 MED ORDER — POTASSIUM CHLORIDE 10 MEQ/100ML IV SOLN
10.0000 meq | INTRAVENOUS | Status: AC
  Administered 2022-10-17 (×3): 10 meq via INTRAVENOUS
  Filled 2022-10-17 (×3): qty 100

## 2022-10-17 MED ORDER — LEVETIRACETAM IN NACL 1500 MG/100ML IV SOLN
1500.0000 mg | Freq: Two times a day (BID) | INTRAVENOUS | Status: AC
  Administered 2022-10-17 – 2022-10-20 (×8): 1500 mg via INTRAVENOUS
  Filled 2022-10-17 (×8): qty 100

## 2022-10-17 MED ORDER — ACETAMINOPHEN 650 MG RE SUPP: 650.0000 mg | Freq: Four times a day (QID) | RECTAL | Status: DC | PRN

## 2022-10-17 MED ORDER — ENOXAPARIN SODIUM 40 MG/0.4ML IJ SOSY
40.0000 mg | PREFILLED_SYRINGE | INTRAMUSCULAR | Status: DC
  Administered 2022-10-18 – 2022-10-21 (×4): 40 mg via SUBCUTANEOUS
  Filled 2022-10-17 (×4): qty 0.4

## 2022-10-17 MED ORDER — ACETAMINOPHEN 325 MG PO TABS
650.0000 mg | ORAL_TABLET | Freq: Four times a day (QID) | ORAL | Status: DC | PRN
  Administered 2022-10-19 – 2022-10-20 (×2): 650 mg via ORAL
  Filled 2022-10-17 (×2): qty 2

## 2022-10-17 MED ORDER — SODIUM CHLORIDE 0.9 % IV SOLN: INTRAVENOUS | Status: DC

## 2022-10-17 MED ORDER — ONDANSETRON HCL 4 MG PO TABS: 4.0000 mg | ORAL_TABLET | Freq: Four times a day (QID) | ORAL | Status: DC | PRN

## 2022-10-17 MED ORDER — SODIUM CHLORIDE 0.9 % IV SOLN
500.0000 mg | Freq: Once | INTRAVENOUS | Status: AC
  Administered 2022-10-17: 500 mg via INTRAVENOUS
  Filled 2022-10-17: qty 10

## 2022-10-17 MED ORDER — THIAMINE HCL 100 MG/ML IJ SOLN
100.0000 mg | Freq: Every day | INTRAMUSCULAR | Status: DC
  Administered 2022-10-17 – 2022-10-20 (×4): 100 mg via INTRAVENOUS
  Filled 2022-10-17 (×5): qty 2

## 2022-10-17 MED ORDER — PHENYTOIN SODIUM 50 MG/ML IJ SOLN
100.0000 mg | Freq: Three times a day (TID) | INTRAMUSCULAR | Status: DC
  Filled 2022-10-17: qty 2

## 2022-10-17 MED ORDER — ADULT MULTIVITAMIN W/MINERALS CH
1.0000 | ORAL_TABLET | Freq: Every day | ORAL | Status: DC
  Administered 2022-10-18 – 2022-10-21 (×4): 1 via ORAL
  Filled 2022-10-17 (×4): qty 1

## 2022-10-17 NOTE — Plan of Care (Signed)
Patient seen early morning by Dr. Amada Jupiter.  Electrographically status epilepticus has resolved with Keppra and Ativan.  Continue Keppra at 1.5 g twice daily.  She has not been compliant to her Dilantin.  Dilantin load was given but I would hold off on the Dilantin since she had stopped seizing even before receiving Dilantin to see if we can just maintain her on 1 antiepileptic which might be helpful for compliance going forward as well.    If she has more clinical or electrographic seizures, will add the second antiepileptic.  She has had a long postictal and recovery time during prior admissions and I would expect her to be coming around slowly.  We will continue to follow with you.  -- Milon Dikes, MD Neurologist [facility] Pager: [phone]

## 2022-10-17 NOTE — Assessment & Plan Note (Signed)
Replace K+

## 2022-10-17 NOTE — Progress Notes (Signed)
Admitted to 3W22, placed on cardiac monitor and second RN verified. Skin assessment completed. No open wounds noted. Confused at this time. No family at bedside at time of transfer.

## 2022-10-17 NOTE — Procedures (Signed)
Patient Name: [name]  MRN: [id number]  Epilepsy Attending: Charlsie Quest  Referring Physician/Provider: Rejeana Brock, MD  Duration: 10/17/2022 0143 to 0800  Patient history:  66 year old female with refractory recurrent seizures without return to baseline. EEG to evaluate for status epilepticus.   Level of alertness: Awake  AEDs during EEG study: LEV  Technical aspects: This EEG study was done with scalp electrodes positioned according to the 10-20 International system of electrode placement. Electrical activity was reviewed with band pass filter of 1-70Hz , sensitivity of 7 uV/mm, display speed of 57mm/sec with a 60Hz  notched filter applied as appropriate. EEG data were recorded continuously and digitally stored.  Video monitoring was available and reviewed as appropriate.  Description: No clear posterior dominant rhythm was seen. EEG showed continuous generalized 3 to 6 Hz theta-delta slowing admixed with an excessive amount of 15 to 18 Hz, beta activity distributed symmetrically and diffusely. Hyperventilation and photic stimulation were not performed.     ABNORMALITY - Continuous slow, generalized - Excessive beta, generalized  IMPRESSION: This study is suggestive of moderate diffuse encephalopathy, nonspecific etiology. No seizures or epileptiform discharges were seen throughout the recording.  [name] 

## 2022-10-17 NOTE — Assessment & Plan Note (Addendum)
Known seizure disorder.  Typically comes in with seizures due to not taking home meds.  Plus or minus EtOH withdrawal on top of that. Suspect she may not be taking home meds again today as her Phenytoin and dilantin levels are undetectable once again. Got keppra load in ED dilantin per neurology, see consult note Off phenobarb per neurology (probably a good idea if she's drinking EtOH). AEDs = keppra and dilantin per neurology EEG Tele monitor Seizure precautions.

## 2022-10-17 NOTE — Consult Note (Addendum)
Neurology Consultation Reason for Consult: Status Epilepticus Referring Physician: [place], P  CC: Seizures  History is obtained from: Chart review  HPI: [name] is a 66 y.o. female with history of epilepsy as well as a tendency for seizure breakthrough when she goes into alcohol withdrawal.  Alcohol abuse, seizures on phenobarbital and Dilantin, possibly Keppra, who presents with multiple recurrent seizures.  She apparently had approximately eight seizures at home prior to being transported by EMS, and then had to with EMS.  She received a total of 10 mg of Versed with EMS.  I tried calling her significant other to obtain corroborating history, but received no answer.   Of note, I saw her for similar presentation in July with focal nonconvulsive status epilepticus.  At that time she broke with fosphenytoin  She has multiple pill bottles in the room, including phenobarbital and phenytoin with fill dates in early September for a 1 month supply each suggesting noncompliance.  There was no Keppra in her medications.  Past Medical History:  Diagnosis Date   Alcohol dependence (HCC)    Anxiety    Chronic pain    Depression    Diverticulitis    Herniated cervical disc    Pancreatitis    Seizures (HCC)    alcoholic seizures     No family history on file.   Social History:  reports that she has been smoking cigarettes. She has a 1.75 pack-year smoking history. She has never used smokeless tobacco. She reports current alcohol use. She reports that she does not currently use drugs after having used the following drugs: Marijuana.   Exam: Current vital signs: There were no vitals taken for this visit. Vital signs in last 24 hours:     Physical Exam  Appears well-developed and well-nourished.   Neuro: Mental Status: Patient is obtunded, she does not follow commands or open eyes Cranial Nerves: II: She does not blink to threat. Pupils are equal, round, and reactive to light.    III,IV, VI: She has roving eye movements that vary between rightward gaze and midline, not crossing midline much to the left, though she does with oculocephalic maneuver V, VII: Corneals are intact bilaterally Motor: She flexes to noxious stimulation in all four extremities  sensory: Does not perform  Deep Tendon Reflexes: No clonus at the ankles  Cerebellar: Does not perform   I have reviewed labs in epic and the results pertinent to this consultation are: Ionized calcium 1.19 Creatinine 0.7 Sodium 134  I have reviewed the images obtained: CT head-unremarkable  Impression: 66 year old female with refractory recurrent seizures without return to baseline equating to status epilepticus.  I am suspicious that she may be noncompliant with her medications, and even if she is taking the dose of Keppra listed in the chart, she can tolerate an additional load.  Therefore I will load with Keppra while Dilantin level is pending.  Recommendations: 1) Keppra 3 g x 1, then continue at 1.5 g twice daily 2) PHT, PHB levels 3) LTM EEG 4) restart home phenytoin, load depending on EEG/levels 5) neurology will continue to follow   Ritta Slot, MD [facility] [phone]  If 7pm- 7am, please page neurology on call as listed in [facility].

## 2022-10-17 NOTE — Progress Notes (Signed)
    Patient: [name] ZSW:[id number] DOB: [birth year]      Brief hospital course: 66 y.o. female with medical history significant of EtOH abuse, seizures, pancreatitis.  Tendency to come in to ED with seizures due to combination of not taking seizure meds and possibly superimposed EtOH withdrawal.   She apparently had approximately eight seizures at home prior to being transported by EMS, and then had to with EMS. She received a total of 10 mg of Versed with EMS.   Phenytoin and phenobarbital bottles in room have early Sept fill dates, 1 month supply. This is a no charge note, for further details, please see the H&P by my partner, Dr. Julian Reil from earlier today.    Principal Problem:   Seizures (HCC) Active Problems:   Alcohol use disorder, severe, dependence (HCC)   Nonadherence to medication   Hypokalemia           Physical Exam: BP (!) 146/86   Pulse 68   Temp 99.1 F (37.3 C) (Axillary)   Resp (!) 24   SpO2 98%      Family Communication:         Author: Alberteen Sam, MD 10/17/2022 7:13 PM

## 2022-10-17 NOTE — Plan of Care (Signed)

## 2022-10-17 NOTE — Progress Notes (Signed)
Trouble shooting equipment and EEG maint complete.

## 2022-10-17 NOTE — Progress Notes (Signed)
No Routine prior-pt has known sz hx. LTM EEG hooked up and running - no initial skin breakdown -  - [facility] is not monitoring due to patient being in the ER.Marland Kitchen

## 2022-10-17 NOTE — Progress Notes (Signed)
LTM maint complete - no skin breakdown Serviced A1 P8 [facility] monitored, Event button test confirmed by [facility].

## 2022-10-17 NOTE — H&P (Signed)
History and Physical    Patient: [name] [id number] DOB: 12/16/1955 DOA: 10/17/2022 DOS: the patient was seen and examined on 10/17/2022 PCP: [facility]  Patient coming from: Home  Chief Complaint:  Chief Complaint  Patient presents with   Seizures   HPI: [name] is a 66 y.o. female with medical history significant of EtOH abuse, seizures, pancreatitis.  Tendency to come in to ED with seizures due to combination of not taking seizure meds and possibly superimposed EtOH withdrawal.  She apparently had approximately eight seizures at home prior to being transported by EMS, and then had to with EMS. She received a total of 10 mg of Versed with EMS.  Phenytoin and phenobarbital bottles in room have early Sept fill dates, 1 month supply.  No keppra in medications.   Review of Systems: As mentioned in the history of present illness. All other systems reviewed and are negative. Past Medical History:  Diagnosis Date   Alcohol dependence ([place])    Anxiety    Chronic pain    Depression    Diverticulitis    Herniated cervical disc    Pancreatitis    Seizures ([place])    alcoholic seizures   Past Surgical History:  Procedure Laterality Date   ABDOMINAL HYSTERECTOMY     ANKLE SURGERY Right    CARPAL TUNNEL RELEASE Bilateral    CERVICAL FUSION     CHOLECYSTECTOMY     KNEE SURGERY     Social History:  reports that she has been smoking cigarettes. She has a 1.75 pack-year smoking history. She has never used smokeless tobacco. She reports current alcohol use. She reports that she does not currently use drugs after having used the following drugs: Marijuana.  Allergies  Allergen Reactions   Keflex [Cephalexin] Hives   Cephalosporins Hives   Prednisone Swelling   Toradol [Ketorolac Tromethamine] Hives   Ultram [Tramadol] Hives    No family history on file.  Prior to Admission medications   Medication Sig Start Date End Date Taking?  Authorizing Provider  acetaminophen (TYLENOL) 325 MG tablet Take 2 tablets (650 mg total) by mouth every 4 (four) hours as needed for fever, mild pain, moderate pain or headache. 05/23/22   Patrecia Pour, MD  FLUoxetine (PROZAC) 20 MG capsule Take 1 capsule (20 mg total) by mouth daily. 04/07/22 07/25/22  Gaylan Gerold, DO  gabapentin (NEURONTIN) 100 MG capsule Take 1 capsule (100 mg total) by mouth 3 (three) times daily. 04/07/22 07/25/22  Gaylan Gerold, DO  levETIRAcetam (KEPPRA) 500 MG tablet Take 1 tablet (500 mg total) by mouth 2 (two) times daily. 05/23/22 06/22/22  Patrecia Pour, MD  naltrexone (DEPADE) 50 MG tablet Take 50 mg by mouth daily.    [provider]  PHENObarbital (LUMINAL) 60 MG tablet Take 1 tablet (60 mg total) by mouth at bedtime for 2 days. 05/23/22 05/25/22  Patrecia Pour, MD  phenytoin (DILANTIN) 100 MG ER capsule Take 1 capsule (100 mg total) by mouth 3 (three) times daily. 05/23/22   Patrecia Pour, MD  thiamine (VITAMIN B1) 100 MG tablet Take 1 tablet (100 mg total) by mouth daily. 05/24/22   Patrecia Pour, MD  DULoxetine (CYMBALTA) 30 MG capsule Take 1 capsule (30 mg total) by mouth daily. Patient not taking: Reported on 09/01/2020 06/11/20 09/02/20  Money, Lowry Ram, FNP  metoCLOPramide (REGLAN) 10 MG tablet Take 1 tablet (10 mg total) by mouth every 6 (six) hours as needed for  nausea (nausea/headache). 04/06/19 05/24/19  Mesner, Barbara Cower, MD    Physical Exam: Vitals:   10/17/22 0209 10/17/22 0315 10/17/22 0330 10/17/22 0336  BP: (!) 163/103 (!) 167/88 (!) 152/82   Pulse: 85 79 78   Resp: 17 20 17    Temp:    98.1 F (36.7 C)  TempSrc:    Axillary  SpO2: 100% 100% 100%    Constitutional: Obtunded Eyes: PERRL, lids and conjunctivae normal ENMT: Mucous membranes are moist. Posterior pharynx clear of any exudate or lesions.Normal dentition.  Neck: normal, supple, no masses, no thyromegaly Respiratory: clear to auscultation bilaterally, no wheezing, no crackles. Normal respiratory  effort. No accessory muscle use.  Cardiovascular: Regular rate and rhythm, no murmurs / rubs / gallops. No extremity edema. 2+ pedal pulses. No carotid bruits.  Abdomen: no tenderness, no masses palpated. No hepatosplenomegaly. Bowel sounds positive.  Musculoskeletal: no clubbing / cyanosis. No joint deformity upper and lower extremities. Good ROM, no contractures. Normal muscle tone.  Skin: no rashes, lesions, ulcers. No induration Neurologic: Obtunded, not following commands. Psychiatric: Obtunded  Data Reviewed:       Latest Ref Rng & Units 10/17/2022    1:35 AM 10/17/2022    1:00 AM 08/12/2022    9:25 PM  CBC  WBC 4.0 - 10.5 K/uL 10.5   5.5   Hemoglobin 12.0 - 15.0 g/dL 08/14/2022  43.1    54.0  08.6   Hematocrit 36.0 - 46.0 % 44.9  47.0    47.0  35.8   Platelets 150 - 400 K/uL 260   207       Latest Ref Rng & Units 10/17/2022    1:35 AM 10/17/2022    1:00 AM 08/12/2022   11:09 PM  CMP  Glucose 70 - 99 mg/dL 08/14/2022  950  932   BUN 8 - 23 mg/dL 5  6  6    Creatinine 0.44 - 1.00 mg/dL 671   2.45   Sodium 135 - 145 mmol/L 136  134    133  137   Potassium 3.5 - 5.1 mmol/L 3.0  2.6    2.6  2.8   Chloride 98 - 111 mmol/L 101  100  109   CO2 22 - 32 mmol/L 16   19   Calcium 8.9 - 10.3 mg/dL 8.09   7.8   Total Protein 6.5 - 8.1 g/dL 7.7   5.5   Total Bilirubin 0.3 - 1.2 mg/dL 0.2   0.4   Alkaline Phos 38 - 126 U/L 88   49   AST 15 - 41 U/L 43   24   ALT 0 - 44 U/L 16   15    Mg = 1.9.  Assessment and Plan: * Seizures (HCC) Known seizure disorder.  Typically comes in with seizures due to not taking home meds.  Plus or minus EtOH withdrawal on top of that. Suspect she may not be taking home meds again today as her Phenytoin and dilantin levels are undetectable once again. Got keppra load in ED dilantin per neurology, see consult note Off phenobarb per neurology (probably a good idea if she's drinking EtOH). AEDs = keppra and dilantin per neurology EEG Tele  monitor Seizure precautions.  Nonadherence to medication Pt with h/o not taking seizure meds previously.  Per EDP note in Oct "Patient admits now that she has not taken her phenobarbital or her Dilantin due to medication side effects."  Dilantin and phenobarb levels negative again today.  Alcohol use  disorder, severe, dependence ([place]) CIWA for withdrawal prevention.  Hypokalemia Replace K      Advance Care Planning:   Code Status: Full Code Pt obtunded and unable to discuss due to post-ictal state.  Consults: Dr. Leonel Ramsay  Family Communication: No family in room  Severity of Illness: The appropriate patient status for this patient is OBSERVATION. Observation status is judged to be reasonable and necessary in order to provide the required intensity of service to ensure the patient's safety. The patient's presenting symptoms, physical exam findings, and initial radiographic and laboratory data in the context of their medical condition is felt to place them at decreased risk for further clinical deterioration. Furthermore, it is anticipated that the patient will be medically stable for discharge from the hospital within 2 midnights of admission.   Author: Etta Quill., DO 10/17/2022 3:58 AM  For on call review [email or web address].

## 2022-10-17 NOTE — Hospital Course (Signed)
66 y.o. female with medical history significant of EtOH abuse, seizures, pancreatitis.  Tendency to come in to ED with seizures due to combination of not taking seizure meds and possibly superimposed EtOH withdrawal.   She apparently had approximately eight seizures at home prior to being transported by EMS, and then had to with EMS. She received a total of 10 mg of Versed with EMS.   Phenytoin and phenobarbital bottles in room have early Sept fill dates, 1 month supply.

## 2022-10-17 NOTE — ED Notes (Addendum)
Pt transferred into room yellow 41, pt sleeping, sonorous resps, NAD, calm, lying flat, supine, no obvious sz activity noted, EEG in progress at this time. VSS.

## 2022-10-17 NOTE — ED Provider Notes (Signed)
[facility] EMERGENCY DEPARTMENT Provider Note  CSN: [id number] Arrival date & time: 10/17/22 0045  Chief Complaint(s) Seizures  HPI [name] is a 66 y.o. female     Seizures Seizure activity on arrival: no   Seizure type:  Grand mal Episode characteristics: generalized shaking, tongue biting and unresponsiveness   Postictal symptoms: somnolence   Return to baseline: no   Duration: 8 seizures prior to EMS arrival (unsure of duration). 2 seizures with EMS enroute lasting approx 1 min each. PTA treatment:  Midazolam (10 mg) History of seizures: yes     Past Medical History Past Medical History:  Diagnosis Date   Alcohol dependence (HCC)    Anxiety    Chronic pain    Depression    Diverticulitis    Herniated cervical disc    Pancreatitis    Seizures (HCC)    alcoholic seizures   Patient Active Problem List   Diagnosis Date Noted   Chronic pain    Hypoalbuminemia    Hyperglycemia    Nonadherence to medication    Seizure (HCC) 05/17/2022   Status epilepticus (HCC)    Hypokalemia    Alcohol-induced mood disorder (HCC)    Closed displaced oblique fracture of shaft of left humerus 08/01/2020   GERD (gastroesophageal reflux disease) 02/28/2020   Alcohol withdrawal syndrome, with delirium (HCC) 12/13/2018   Thrombocytopenia (HCC) 12/13/2018   Neuropathy 12/13/2018   Subarachnoid hemorrhage (HCC) 12/11/2018   Seizure due to alcohol withdrawal (HCC) 12/11/2018   Alcohol dependence with alcohol-induced mood disorder (HCC)    Major depressive disorder, 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 Authorizing Provider  acetaminophen (TYLENOL) 325 MG tablet Take 2 tablets (650 mg total) by mouth every 4 (four) hours as needed for fever, mild pain, moderate pain or headache.  05/23/22   Tyrone Nine, MD  FLUoxetine (PROZAC) 20 MG capsule Take 1 capsule (20 mg total) by mouth daily. 04/07/22 07/25/22  Doran Stabler, DO  gabapentin (NEURONTIN) 100 MG capsule Take 1 capsule (100 mg total) by mouth 3 (three) times daily. 04/07/22 07/25/22  Doran Stabler, DO  levETIRAcetam (KEPPRA) 500 MG tablet Take 1 tablet (500 mg total) by mouth 2 (two) times daily. 05/23/22 06/22/22  Tyrone Nine, MD  naltrexone (DEPADE) 50 MG tablet Take 50 mg by mouth daily.    [provider]  PHENObarbital (LUMINAL) 60 MG tablet Take 1 tablet (60 mg total) by mouth at bedtime for 2 days. 05/23/22 05/25/22  Tyrone Nine, MD  phenytoin (DILANTIN) 100 MG ER capsule Take 1 capsule (100 mg total) by mouth 3 (three) times daily. 05/23/22   Tyrone Nine, MD  thiamine (VITAMIN B1) 100 MG tablet Take 1 tablet (100 mg total) by mouth daily. 05/24/22   Tyrone Nine, MD  DULoxetine (CYMBALTA) 30 MG capsule Take 1 capsule (30 mg total) by mouth daily. Patient not taking: Reported on 09/01/2020 06/11/20 09/02/20  Money, [name], FNP  metoCLOPramide (REGLAN) 10 MG tablet Take 1 tablet (10 mg total) by mouth every 6 (six) hours as needed for nausea (nausea/headache). 04/06/19 05/24/19  Mesner, Barbara Cower, MD  Allergies Keflex [cephalexin], Cephalosporins, Prednisone, Toradol [ketorolac tromethamine], and Ultram [tramadol]  Review of Systems Review of Systems  Neurological:  Positive for seizures.   As noted in HPI  Physical Exam Vital Signs  I have reviewed the triage vital signs BP (!) 167/88 (BP Location: Left Arm)   Pulse 79   Resp 20   SpO2 100%   Physical Exam Vitals reviewed.  Constitutional:      General: She is not in acute distress.    Appearance: She is well-developed. She is not diaphoretic.  HENT:     Head: Normocephalic and atraumatic.     Nose: Nose normal.      Mouth/Throat:     Comments: Multiple bite marks on tongue Eyes:     General: No scleral icterus.       Right eye: No discharge.        Left eye: No discharge.     Conjunctiva/sclera: Conjunctivae normal.     Pupils: Pupils are equal, round, and reactive to light.  Cardiovascular:     Rate and Rhythm: Normal rate and regular rhythm.     Heart sounds: No murmur heard.    No friction rub. No gallop.  Pulmonary:     Effort: Pulmonary effort is normal. No respiratory distress.     Breath sounds: Normal breath sounds. No stridor. No rales.  Abdominal:     General: There is no distension.     Palpations: Abdomen is soft.     Tenderness: There is no abdominal tenderness.  Musculoskeletal:        General: No tenderness.     Cervical back: Normal range of motion and neck supple.  Skin:    General: Skin is warm and dry.     Findings: No erythema or rash.  Neurological:     Mental Status: She is unresponsive.     Comments: Rightward eye deviation with rhythmic saccade movements Decordicate to pain     ED Results and Treatments Labs (all labs ordered are listed, but only abnormal results are displayed) Labs Reviewed  COMPREHENSIVE METABOLIC PANEL - Abnormal; Notable for the following components:      Result Value   Potassium 3.0 (*)    CO2 16 (*)    Glucose, Bld 238 (*)    BUN 5 (*)    AST 43 (*)    Total Bilirubin 0.2 (*)    Anion gap 19 (*)    All other components within normal limits  CBC WITH DIFFERENTIAL/PLATELET - Abnormal; Notable for the following components:   MCV 103.9 (*)    Neutro Abs 8.9 (*)    All other components within normal limits  PHENOBARBITAL LEVEL - Abnormal; Notable for the following components:   Phenobarbital <5.0 (*)    All other components within normal limits  PHENYTOIN LEVEL, TOTAL - Abnormal; Notable for the following components:   Phenytoin Lvl <2.5 (*)    All other components within normal limits  CBG MONITORING, ED - Abnormal; Notable for  the following components:   Glucose-Capillary 288 (*)    All other components within normal limits  I-STAT CHEM 8, ED - Abnormal; Notable for the following components:   Sodium 134 (*)    Potassium 2.6 (*)    BUN 6 (*)    Glucose, Bld 288 (*)    TCO2 17 (*)    Hemoglobin 16.0 (*)    HCT 47.0 (*)    All other components within normal limits  I-STAT  VENOUS BLOOD GAS, ED - Abnormal; Notable for the following components:   pH, Ven 7.118 (*)    pO2, Ven 89 (*)    Bicarbonate 14.5 (*)    TCO2 16 (*)    Acid-base deficit 15.0 (*)    Sodium 133 (*)    Potassium 2.6 (*)    HCT 47.0 (*)    Hemoglobin 16.0 (*)    All other components within normal limits  MAGNESIUM  ETHANOL  RAPID URINE DRUG SCREEN, HOSP PERFORMED  LEVETIRACETAM LEVEL                                                                                                                         EKG  EKG Interpretation  Date/Time:    Ventricular Rate:    PR Interval:    QRS Duration:   QT Interval:    QTC Calculation:   R Axis:     Text Interpretation:         Radiology CT Head Wo Contrast  Result Date: 10/17/2022 CLINICAL DATA:  Status post seizure. EXAM: CT HEAD WITHOUT CONTRAST TECHNIQUE: Contiguous axial images were obtained from the base of the skull through the vertex without intravenous contrast. RADIATION DOSE REDUCTION: This exam was performed according to the departmental dose-optimization program which includes automated exposure control, adjustment of the mA and/or kV according to patient size and/or use of iterative reconstruction technique. COMPARISON:  May 17, 2022 FINDINGS: Brain: There is mild cerebral atrophy with widening of the extra-axial spaces and ventricular dilatation. There are areas of decreased attenuation within the white matter tracts of the supratentorial brain, consistent with microvascular disease changes. Vascular: No hyperdense vessel or unexpected calcification. Skull: Normal. Negative  for fracture or focal lesion. Sinuses/Orbits: A 12 mm x 5 mm right maxillary sinus polyp versus mucous retention cyst is noted. Other: None. IMPRESSION: 1. No acute intracranial abnormality. 2. Generalized cerebral atrophy with widening of the extra-axial spaces and ventricular dilatation. 3. 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 4. 12 mm x 5 mm right maxillary sinus polyp versus mucous retention cyst. Electronically Signed   By: Aram Candelahaddeus  Houston M.D.   On: 10/17/2022 01:10    Medications Ordered in ED Medications  sodium chloride 0.9 % bolus 1,000 mL (1,000 mLs Intravenous New Bag/Given 10/17/22 0206)    And  0.9 %  sodium chloride infusion ( Intravenous New Bag/Given 10/17/22 0328)  thiamine (VITAMIN B1) injection 100 mg (has no administration in time range)  potassium chloride 10 mEq in 100 mL IVPB (10 mEq Intravenous New Bag/Given 10/17/22 0252)  levETIRAcetam (KEPPRA) IVPB 1500 mg/ 100 mL premix (has no administration in time range)  phenytoin (DILANTIN) injection 100 mg (has no administration in time range)  levETIRAcetam (KEPPRA) 3,000 mg in sodium chloride 0.9 % 250 mL IVPB (0 mg Intravenous Stopped 10/17/22 0324)  Procedures .1-3 Lead EKG Interpretation  Performed by: Nira Conn, MD Authorized by: Nira Conn, MD     Interpretation: normal     ECG rate:  110   ECG rate assessment: tachycardic     Rhythm: sinus tachycardia     Ectopy: none     Conduction: normal   .Critical Care  Performed by: Nira Conn, MD Authorized by: Nira Conn, MD   Critical care provider statement:    Critical care time (minutes):  45   Critical care time was exclusive of:  Separately billable procedures and treating other patients   Critical care was necessary to treat or prevent imminent or  life-threatening deterioration of the following conditions:  CNS failure or compromise   Critical care was time spent personally by me on the following activities:  Development of treatment plan with patient or surrogate, discussions with consultants, evaluation of patient's response to treatment, examination of patient, obtaining history from patient or surrogate, review of old charts, re-evaluation of patient's condition, pulse oximetry, ordering and review of radiographic studies, ordering and review of laboratory studies and ordering and performing treatments and interventions   Care discussed with: admitting provider     (including critical care time)  Medical Decision Making / ED Course   Medical Decision Making Amount and/or Complexity of Data Reviewed Labs: ordered. Decision-making details documented in ED Course. Radiology: ordered and independent interpretation performed. Decision-making details documented in ED Course. ECG/medicine tests: ordered and independent interpretation performed. Decision-making details documented in ED Course.  Risk Prescription drug management. Decision regarding hospitalization.    Seizures concerning for status. Will need to rule out ICH as well. Labs and imaging ordered Consulted neurology  EKG with sinus tachycardia.  No other dysrhythmias or blocks. CT head negative for ICH or mass effect. Dr. Amada Jupiter from neurology evaluated patient at bedside and was concerned for status.  Emergent EEG ordered. Patient loaded with Keppra  Labs: CBC without leukocytosis or anemia Metabolic panel with mild hypokalemia.  Hyperglycemia. Anion gap but likely from elevated lactic acid from seizures. Doubt DKA, but will get beta-hydrobutyric acid.  phenytoin and phenobarbital levels subtherapeutic  2L IV bolus given. Will get lactic acid, CK  Plan to admit to medicine     Final Clinical Impression(s) / ED Diagnoses Final diagnoses:  Status  epilepticus (HCC)           This chart was dictated using voice recognition software.  Despite best efforts to proofread,  errors can occur which can change the documentation meaning.    Nira Conn, MD 10/17/22 [phone]

## 2022-10-17 NOTE — Assessment & Plan Note (Signed)
CIWA for withdrawal prevention.

## 2022-10-17 NOTE — Progress Notes (Signed)
Pt was moved to 3w22 while I was with another pt. Got to room, Machine was NOT plugged in, or running. Pt is now up and running.- push button tested - [facility] monitoring.

## 2022-10-17 NOTE — ED Triage Notes (Signed)
Patient arrived via [facility] to be evaluated for seizure. EMS called by husband that patient had a seizure that lasted for unknown time, also had seizure with EMS x2 that lasted for about 1 minutes each. Patient was given 10mg  midazolam en-route by EMS. Per EMS [id number].

## 2022-10-17 NOTE — Assessment & Plan Note (Signed)
Pt with h/o not taking seizure meds previously.  Per EDP note in Oct "Patient admits now that she has not taken her phenobarbital or her Dilantin due to medication side effects."  Dilantin and phenobarb levels negative again today.

## 2022-10-18 DIAGNOSIS — R569 Unspecified convulsions: Secondary | ICD-10-CM | POA: Diagnosis not present

## 2022-10-18 DIAGNOSIS — F10239 Alcohol dependence with withdrawal, unspecified: Secondary | ICD-10-CM | POA: Diagnosis present

## 2022-10-18 DIAGNOSIS — G928 Other toxic encephalopathy: Secondary | ICD-10-CM | POA: Diagnosis present

## 2022-10-18 DIAGNOSIS — Z888 Allergy status to other drugs, medicaments and biological substances status: Secondary | ICD-10-CM | POA: Diagnosis not present

## 2022-10-18 DIAGNOSIS — Z981 Arthrodesis status: Secondary | ICD-10-CM | POA: Diagnosis not present

## 2022-10-18 DIAGNOSIS — F1020 Alcohol dependence, uncomplicated: Secondary | ICD-10-CM | POA: Diagnosis not present

## 2022-10-18 DIAGNOSIS — G9341 Metabolic encephalopathy: Secondary | ICD-10-CM | POA: Diagnosis present

## 2022-10-18 DIAGNOSIS — E876 Hypokalemia: Secondary | ICD-10-CM | POA: Diagnosis present

## 2022-10-18 DIAGNOSIS — F17210 Nicotine dependence, cigarettes, uncomplicated: Secondary | ICD-10-CM | POA: Diagnosis present

## 2022-10-18 DIAGNOSIS — Z91148 Patient's other noncompliance with medication regimen for other reason: Secondary | ICD-10-CM | POA: Diagnosis not present

## 2022-10-18 DIAGNOSIS — Z881 Allergy status to other antibiotic agents status: Secondary | ICD-10-CM | POA: Diagnosis not present

## 2022-10-18 DIAGNOSIS — F32A Depression, unspecified: Secondary | ICD-10-CM | POA: Diagnosis present

## 2022-10-18 DIAGNOSIS — E875 Hyperkalemia: Secondary | ICD-10-CM | POA: Diagnosis not present

## 2022-10-18 DIAGNOSIS — G40901 Epilepsy, unspecified, not intractable, with status epilepticus: Secondary | ICD-10-CM | POA: Diagnosis present

## 2022-10-18 DIAGNOSIS — E162 Hypoglycemia, unspecified: Secondary | ICD-10-CM | POA: Diagnosis present

## 2022-10-18 DIAGNOSIS — G8929 Other chronic pain: Secondary | ICD-10-CM | POA: Diagnosis present

## 2022-10-18 DIAGNOSIS — Z79899 Other long term (current) drug therapy: Secondary | ICD-10-CM | POA: Diagnosis not present

## 2022-10-18 DIAGNOSIS — Z885 Allergy status to narcotic agent status: Secondary | ICD-10-CM | POA: Diagnosis not present

## 2022-10-18 LAB — COMPREHENSIVE METABOLIC PANEL
ALT: 18 U/L (ref 0–44)
AST: 48 U/L — ABNORMAL HIGH (ref 15–41)
Albumin: 3.4 g/dL — ABNORMAL LOW (ref 3.5–5.0)
Alkaline Phosphatase: 60 U/L (ref 38–126)
Anion gap: 10 (ref 5–15)
BUN: <5 mg/dL — ABNORMAL LOW (ref 8–23)
CO2: 21 mmol/L — ABNORMAL LOW (ref 22–32)
Calcium: 8.5 mg/dL — ABNORMAL LOW (ref 8.9–10.3)
Chloride: 105 mmol/L (ref 98–111)
Creatinine, Ser: 0.63 mg/dL (ref 0.44–1.00)
GFR, Estimated: >60 mL/min (ref >60)
Glucose, Bld: 80 mg/dL (ref 70–99)
Potassium: 2.7 mmol/L — CL (ref 3.5–5.1)
Sodium: 136 mmol/L (ref 135–145)
Total Bilirubin: 0.9 mg/dL (ref 0.3–1.2)
Total Protein: 6.4 g/dL — ABNORMAL LOW (ref 6.5–8.1)

## 2022-10-18 LAB — CBC
HCT: 42.1 % (ref 36.0–46.0)
Hemoglobin: 14.1 g/dL (ref 12.0–15.0)
MCH: 33.2 pg (ref 26.0–34.0)
MCHC: 33.5 g/dL (ref 30.0–36.0)
MCV: 99.1 fL (ref 80.0–100.0)
Platelets: 204 10*3/uL (ref 150–400)
RBC: 4.25 MIL/uL (ref 3.87–5.11)
RDW: 14.8 % (ref 11.5–15.5)
WBC: 8.4 10*3/uL (ref 4.0–10.5)
nRBC: 0 % (ref 0.0–0.2)

## 2022-10-18 MED ORDER — POTASSIUM CHLORIDE IN NACL 40-0.9 MEQ/L-% IV SOLN
INTRAVENOUS | Status: DC
  Filled 2022-10-18 (×4): qty 1000

## 2022-10-18 MED ORDER — POTASSIUM CHLORIDE CRYS ER 20 MEQ PO TBCR
40.0000 meq | EXTENDED_RELEASE_TABLET | Freq: Three times a day (TID) | ORAL | Status: AC
  Administered 2022-10-18 (×3): 40 meq via ORAL
  Filled 2022-10-18 (×3): qty 2

## 2022-10-18 MED ORDER — SODIUM CHLORIDE 0.9 % IV SOLN
200.0000 mg | Freq: Two times a day (BID) | INTRAVENOUS | Status: AC
  Administered 2022-10-18 – 2022-10-20 (×6): 200 mg via INTRAVENOUS
  Filled 2022-10-18 (×6): qty 20

## 2022-10-18 NOTE — Assessment & Plan Note (Signed)
Due to status epilepticus and alcohol withdrawal and post-ictal confusion.  Past experience caring for this patient by our Neurology team is that she has a prolonged post-ictal state.

## 2022-10-18 NOTE — Procedures (Signed)
Patient Name: [name]  MRN: [id number]  Epilepsy Attending: Charlsie Quest  Referring Physician/Provider: Rejeana Brock, MD  Duration: 10/18/2022 0143 to 10/18/2022 0730   Patient history:  66 year old female with refractory recurrent seizures without return to baseline. EEG to evaluate for status epilepticus.    Level of alertness: Awake, asleep   AEDs during EEG study: LEV, Ativan    Technical aspects: This EEG study was done with scalp electrodes positioned according to the 10-20 International system of electrode placement. Electrical activity was reviewed with band pass filter of 1-70Hz , sensitivity of 7 uV/mm, display speed of 98mm/sec with a 60Hz  notched filter applied as appropriate. EEG data were recorded continuously and digitally stored.  Video monitoring was available and reviewed as appropriate.   Description: No clear posterior dominant rhythm was seen. Sleep was characterized by sleep spindles (12-14hz ), maximal fronto-central region. EEG showed continuous generalized 3 to 6 Hz theta-delta slowing admixed with an excessive amount of 15 to 18 Hz, beta activity distributed symmetrically and diffusely. Intermittent rhythmic 2-3Hz  delta slowing was also noted in right> left temporal region. Independent spikes were noted in left and right temporal region, at times periodic at 1-2hz  as well as rhythmic lasting 3-9 seconds consistent with brief ictal-interictal rhythmic discharges ( BIRDs). Hyperventilation and photic stimulation were not performed.      ABNORMALITY - Brief ictal-interictal rhythmic discharges, left and right temporal region ( BIRDs) - Intermittent rhythmic delta slow, right> left temporal region - Continuous slow, generalized - Excessive beta, generalized   IMPRESSION: This study showed evidence of independent epileptogenicity and cortical dysfunction arising from left and right temporal region which is on the ictal-interictal continuum with high  potential for seizures. Additionally there is moderate diffuse encephalopathy, nonspecific etiology. The excessive beta activity seen in the background is most likely due to the effect of benzodiazepine and is a benign EEG pattern.    Sherman Donaldson 

## 2022-10-18 NOTE — Progress Notes (Signed)
  Progress Note   Patient: [name] GYJ:[id number] DOB: [birth year] DOA: 10/17/2022     0 DOS: the patient was seen and examined on 10/18/2022        Brief hospital course: 66 y.o. female with medical history significant of EtOH abuse, seizures, pancreatitis.  Tendency to come in to ED with seizures due to combination of not taking seizure meds and possibly superimposed EtOH withdrawal.   She apparently had approximately eight seizures at home prior to being transported by EMS, and then had to with EMS. She received a total of 10 mg of Versed with EMS.   Phenytoin and phenobarbital bottles in room have early Sept fill dates, 1 month supply.     Assessment and Plan: * Seizures (HCC) - Continue cEEG - Continue Vimpat and Keppra - Consult Neurology    Nonadherence to medication See prior notes  Alcohol use disorder, severe, dependence (HCC) - Continue CIWA - Continue thiamine and folate  Hypokalemia - Aggressively replace K  Acute metabolic encephalopathy Due to status epilepticus and alcohol withdrawal and post-ictal confusion.  Past experience caring for this patient by our Neurology team is that she has a prolonged post-ictal state.  Still very confused - NPO except pills under supervision - Continue IV fluids        Subjective: Still very confused, but makes eye contact today.  Swallowing pills.     Physical Exam: BP (!) 143/93 (BP Location: Left Arm)   Pulse 80   Temp 97.8 F (36.6 C) (Axillary)   Resp 19   Ht 5\' 1"  (1.549 m)   Wt 62.2 kg   SpO2 99%   BMI 25.91 kg/m   Adult female, lying in bed, very confused and sedated, EEG leads in place Cardiorespiratory exam normal Abdomen without grimace to palpation    Data Reviewed: Basic metabolic panel shows severe hypokalemia CBC normal  Family Communication:     Disposition: Status is: Inpatient         Author: , MD 10/18/2022 7:38 PM  For on call  review www.[email or web address]/20/[email or web address].

## 2022-10-18 NOTE — Progress Notes (Signed)
Neurology Progress Note   S:// Patient seen and examined Overnight EEG read with evidence of independent epileptogenicity and cortical dysfunction from both the right and the left temporal region which is on the ictal interictal continuum with hypertension for seizures.  Additionally moderate diffuse encephalopathy of nonspecific etiology seen.  Exacerbated in the background likely medication effect from benzos.   O:// Current vital signs: BP (!) 140/97 (BP Location: Right Arm)   Pulse 67   Temp 97.9 F (36.6 C) (Axillary)   Resp 17   Ht 5\' 1"  (1.549 m)   Wt 62.2 kg   SpO2 99%   BMI 25.91 kg/m  Vital signs in last 24 hours: Temp:  [97.9 F (36.6 C)-99.9 F (37.7 C)] 97.9 F (36.6 C) (12/31 0831) Pulse Rate:  [67-87] 67 (12/31 0831) Resp:  [16-31] 17 (12/31 0831) BP: (139-161)/(75-103) 140/97 (12/31 0831) SpO2:  [96 %-100 %] 99 % (12/31 0831) Weight:  [62.2 kg] 62.2 kg (12/30 2000) General: Awake alert in no distress HEENT: Normocephalic and atraumatic Lungs: Clear Neurological exam She is awake alert in no distress She is aphasic-mumbles incomprehensibly She is not able to follow commands Cranial nerves II to XII appear intact Motor examination with grossly symmetric strength antigravity in all fours Sensation intact to light touch   Medications  Current Facility-Administered Medications:    0.9 % NaCl with KCl 40 mEq / L  infusion, , Intravenous, Continuous, Danford, 08-22-2002, MD   acetaminophen (TYLENOL) tablet 650 mg, 650 mg, Oral, Q6H PRN **OR** acetaminophen (TYLENOL) suppository 650 mg, 650 mg, Rectal, Q6H PRN, Earl Lites, Jared M, DO   enoxaparin (LOVENOX) injection 40 mg, 40 mg, Subcutaneous, Q24H, 07-05-1999, Jared M, DO   folic acid (FOLVITE) tablet 1 mg, 1 mg, Oral, Daily, M, Jared M, DO   lacosamide (VIMPAT) 200 mg in sodium chloride 0.9 % 25 mL IVPB, 200 mg, Intravenous, Q12H, Shafer, Devon, NP   levETIRAcetam (KEPPRA) IVPB 1500 mg/ 100 mL premix,  1,500 mg, Intravenous, Q12H, M, MD, Stopped at 10/17/22 2202   LORazepam (ATIVAN) tablet 1-4 mg, 1-4 mg, Oral, Q1H PRN **OR** LORazepam (ATIVAN) injection 1-4 mg, 1-4 mg, Intravenous, Q1H PRN, 2203, DO, 2 mg at 10/18/22 10/20/22   multivitamin with minerals tablet 1 tablet, 1 tablet, Oral, Daily, 8295, Jared M, DO   ondansetron [facility]) tablet 4 mg, 4 mg, Oral, Q6H PRN **OR** ondansetron (ZOFRAN) injection 4 mg, 4 mg, Intravenous, Q6H PRN, [facility], Jared M, DO   potassium chloride SA (KLOR-CON M) CR tablet 40 mEq, 40 mEq, Oral, TID, Hall, Carole N, DO, 40 mEq at 10/18/22 0541   thiamine (VITAMIN B1) injection 100 mg, 100 mg, Intravenous, Daily, Cardama, 10/20/22, MD, 100 mg at 10/17/22 1431  Labs CBC    Component Value Date/Time   WBC 8.4 10/18/2022 0348   RBC 4.25 10/18/2022 0348   HGB 14.1 10/18/2022 0348   HCT 42.1 10/18/2022 0348   PLT 204 10/18/2022 0348   MCV 99.1 10/18/2022 0348   MCH 33.2 10/18/2022 0348   MCHC 33.5 10/18/2022 0348   RDW 14.8 10/18/2022 0348   LYMPHSABS 0.8 10/17/2022 0135   MONOABS 0.7 10/17/2022 0135   EOSABS 0.0 10/17/2022 0135   BASOSABS 0.0 10/17/2022 0135    CMP     Component Value Date/Time   NA 136 10/18/2022 0348   K 2.7 (LL) 10/18/2022 0348   CL 105 10/18/2022 0348   CO2 21 (L) 10/18/2022 0348   GLUCOSE 80 10/18/2022  0348   BUN <5 (L) 10/18/2022 0348   CREATININE 0.63 10/18/2022 0348   CALCIUM 8.5 (L) 10/18/2022 0348   PROT 6.4 (L) 10/18/2022 0348   ALBUMIN 3.4 (L) 10/18/2022 0348   AST 48 (H) 10/18/2022 0348   ALT 18 10/18/2022 0348   ALKPHOS 60 10/18/2022 0348   BILITOT 0.9 10/18/2022 0348   GFRNONAA >60 10/18/2022 0348   GFRAA >60 06/09/2020 0816    Imaging I have reviewed images in epic and the results pertinent to this consultation are: CT head with no acute changes.  Assessment: 66 year old woman with recurrent seizures without return to baseline according to status epilepticus.  Prior  history of noncompliance and alcohol abuse.  Came in with undetectable levels of Dilantin and phenobarbital.  Also reports of empty bottles from 3 months ago without any refills. Loaded with Keppra and Dilantin.  Continued on Keppra.  Continues to have independent epileptogenicity from bilateral temporal lobes. Will add second AED and continue to monitor on EEG  Impression: Breakthrough seizure and status epilepticus in the setting of noncompliance, alcohol abuse history  Recommendations: Add Vimpat 200 twice daily Continue Keppra 1500 twice daily Maintain seizure precautions Continue LTM EEG Check CBC and BMP.  Replete electrolytes as necessary-per primary team. Has had prolonged postictal states prior to clinical recovery and I suspect that could be the case this admission as well. Neurology will follow with you.  -- Milon Dikes, MD Neurologist [facility] Pager: [phone]

## 2022-10-19 DIAGNOSIS — R569 Unspecified convulsions: Secondary | ICD-10-CM | POA: Diagnosis not present

## 2022-10-19 DIAGNOSIS — F1020 Alcohol dependence, uncomplicated: Secondary | ICD-10-CM | POA: Diagnosis not present

## 2022-10-19 DIAGNOSIS — E875 Hyperkalemia: Secondary | ICD-10-CM | POA: Insufficient documentation

## 2022-10-19 DIAGNOSIS — E876 Hypokalemia: Secondary | ICD-10-CM | POA: Diagnosis not present

## 2022-10-19 DIAGNOSIS — Z91148 Patient's other noncompliance with medication regimen for other reason: Secondary | ICD-10-CM | POA: Diagnosis not present

## 2022-10-19 DIAGNOSIS — G9341 Metabolic encephalopathy: Secondary | ICD-10-CM

## 2022-10-19 LAB — GLUCOSE, CAPILLARY
Glucose-Capillary: 45 mg/dL — ABNORMAL LOW (ref 70–99)
Glucose-Capillary: 71 mg/dL (ref 70–99)
Glucose-Capillary: 138 mg/dL — ABNORMAL HIGH (ref 70–99)
Glucose-Capillary: 197 mg/dL — ABNORMAL HIGH (ref 70–99)
Glucose-Capillary: 155 mg/dL — ABNORMAL HIGH (ref 70–99)

## 2022-10-19 LAB — CBC
HCT: 40.3 % (ref 36.0–46.0)
Hemoglobin: 13.4 g/dL (ref 12.0–15.0)
MCH: 33.8 pg (ref 26.0–34.0)
MCHC: 33.3 g/dL (ref 30.0–36.0)
MCV: 101.5 fL — ABNORMAL HIGH (ref 80.0–100.0)
Platelets: 240 10*3/uL (ref 150–400)
RBC: 3.97 MIL/uL (ref 3.87–5.11)
RDW: 14.8 % (ref 11.5–15.5)
WBC: 6.8 10*3/uL (ref 4.0–10.5)
nRBC: 0.9 % — ABNORMAL HIGH (ref 0.0–0.2)

## 2022-10-19 LAB — BLOOD GAS, VENOUS
Acid-Base Excess: 0.3 mmol/L (ref 0.0–2.0)
Bicarbonate: 23.5 mmol/L (ref 20.0–28.0)
O2 Saturation: 60.6 %
Patient temperature: 37
pCO2, Ven: 33 mmHg — ABNORMAL LOW (ref 44–60)
pH, Ven: 7.46 — ABNORMAL HIGH (ref 7.25–7.43)
pO2, Ven: 35 mmHg (ref 32–45)

## 2022-10-19 LAB — BASIC METABOLIC PANEL
Anion gap: 18 — ABNORMAL HIGH (ref 5–15)
Anion gap: 12 (ref 5–15)
BUN: 7 mg/dL — ABNORMAL LOW (ref 8–23)
BUN: 5 mg/dL — ABNORMAL LOW (ref 8–23)
CO2: 13 mmol/L — ABNORMAL LOW (ref 22–32)
CO2: 21 mmol/L — ABNORMAL LOW (ref 22–32)
Calcium: 8.9 mg/dL (ref 8.9–10.3)
Calcium: 8.9 mg/dL (ref 8.9–10.3)
Chloride: 110 mmol/L (ref 98–111)
Chloride: 105 mmol/L (ref 98–111)
Creatinine, Ser: 0.63 mg/dL (ref 0.44–1.00)
Creatinine, Ser: 0.75 mg/dL (ref 0.44–1.00)
GFR, Estimated: >60 mL/min (ref >60)
GFR, Estimated: >60 mL/min (ref >60)
Glucose, Bld: 51 mg/dL — ABNORMAL LOW (ref 70–99)
Glucose, Bld: 109 mg/dL — ABNORMAL HIGH (ref 70–99)
Potassium: 6.3 mmol/L (ref 3.5–5.1)
Potassium: 3.8 mmol/L (ref 3.5–5.1)
Sodium: 141 mmol/L (ref 135–145)
Sodium: 138 mmol/L (ref 135–145)

## 2022-10-19 MED ORDER — SODIUM BICARBONATE 8.4 % IV SOLN
100.0000 meq | INTRAVENOUS | Status: AC
  Administered 2022-10-19: 100 meq via INTRAVENOUS
  Filled 2022-10-19: qty 50

## 2022-10-19 MED ORDER — SODIUM ZIRCONIUM CYCLOSILICATE 10 G PO PACK
10.0000 g | PACK | ORAL | Status: AC
  Administered 2022-10-19: 10 g via ORAL
  Filled 2022-10-19: qty 1

## 2022-10-19 MED ORDER — CALCIUM GLUCONATE-NACL 1-0.675 GM/50ML-% IV SOLN
1.0000 g | Freq: Once | INTRAVENOUS | Status: AC
  Administered 2022-10-19: 1000 mg via INTRAVENOUS
  Filled 2022-10-19: qty 50

## 2022-10-19 MED ORDER — DEXTROSE 50 % IV SOLN: 50.0000 mL | INTRAVENOUS | Status: DC | PRN

## 2022-10-19 MED ORDER — GABAPENTIN 300 MG PO CAPS
300.0000 mg | ORAL_CAPSULE | Freq: Every day | ORAL | Status: DC
  Administered 2022-10-19 – 2022-10-20 (×2): 300 mg via ORAL
  Filled 2022-10-19 (×2): qty 1

## 2022-10-19 MED ORDER — SODIUM CHLORIDE 0.9 % IV BOLUS
1000.0000 mL | Freq: Once | INTRAVENOUS | Status: AC
  Administered 2022-10-19: 1000 mL via INTRAVENOUS

## 2022-10-19 MED ORDER — DEXTROSE-NACL 5-0.9 % IV SOLN: INTRAVENOUS | Status: DC

## 2022-10-19 NOTE — Assessment & Plan Note (Signed)
Transient.  Treated overnight and resolved. Might have just been hemolysis.

## 2022-10-19 NOTE — Progress Notes (Signed)
LTM EEG disconnected - no skin breakdown at unhook. [facility] notified of DC.  

## 2022-10-19 NOTE — Progress Notes (Signed)
CSW received consult from patients RN. Patient request for CSW to try and get in contact with her spouse [name]. CSW called [name] with contact information provided by patient. CSW unable to reach [place] and unable to leave voicemail. TOC will try to reach out again.

## 2022-10-19 NOTE — Procedures (Addendum)
Patient Name: [name]  MRN: [id number]  Epilepsy Attending: Lora Havens  Referring Physician/Provider: Greta Doom, MD  Duration: 08/2023 0143 to 10/19/2022 1201   Patient history:  67 year old female with refractory recurrent seizures without return to baseline. EEG to evaluate for status epilepticus.    Level of alertness: Awake, asleep   AEDs during EEG study: LEV, LCM   Technical aspects: This EEG study was done with scalp electrodes positioned according to the 10-20 International system of electrode placement. Electrical activity was reviewed with band pass filter of 1-70Hz , sensitivity of 7 uV/mm, display speed of 97mm/sec with a 60Hz  notched filter applied as appropriate. EEG data were recorded continuously and digitally stored.  Video monitoring was available and reviewed as appropriate.   Description: No clear posterior dominant rhythm was seen. Sleep was characterized by sleep spindles (12-14hz ), maximal fronto-central region. EEG showed continuous generalized and maximal bitemporal 3 to 6 Hz theta-delta slowing admixed 15 to 18 Hz beta activity distributed symmetrically and diffusely. Hyperventilation and photic stimulation were not performed.      ABNORMALITY - Continuous slow, generalized and maximal bitemporal region   IMPRESSION: This study showed evidence of cortical dysfunction arising from bitemporal region likely secondary to post-ictal state. Additionally there is moderate diffuse encephalopathy, nonspecific etiology.  No seizures were noted during this study.  EEG appears to be improving compared to previous day.   Deshone Lyssy Barbra Sarks

## 2022-10-19 NOTE — Progress Notes (Signed)
Tech completed LTM Maintenance, FP1, FP2, P8.

## 2022-10-19 NOTE — Progress Notes (Signed)
   10/19/22 0439  Provider Notification  Provider Name/Title Dr Nevada Crane  Date Provider Notified 10/19/22  Time Provider Notified [phone]  Method of Notification Page  Notification Reason Critical Result  Test performed and critical result K 6.3  Date Critical Result Received 10/19/22  Time Critical Result Received 0430  Provider response See new orders  Date of Provider Response 10/19/22  Time of Provider Response 0445   Dr Nevada Crane notified of critical K this am of 6.3.  K yesterday had been 2.7.  IVF stopped (NSS with 65meq of K at 125/hr).  Multiple new orders including EKG, Calcium Gluconate, Bicarb, Lokema and stat labs.   EKG and medications done.  However patient very difficult stick and phlebotomy has been unsuccessful so far at obtaining sample.  IV team notified and Dr Nevada Crane aware of phlebotomy issues.

## 2022-10-19 NOTE — Progress Notes (Signed)
  Progress Note   Patient: [name] WUJ:[id number] DOB: [birth year] DOA: 10/17/2022     1 DOS: the patient was seen and examined on 10/19/2022 at 9:05AM      Brief hospital course: 67 y.o. female with medical history significant of EtOH abuse, seizures, pancreatitis.  Tendency to come in to ED with seizures due to combination of not taking seizure meds and possibly superimposed EtOH withdrawal.   She apparently had approximately eight seizures at home prior to being transported by EMS, and then had to with EMS. She received a total of 10 mg of Versed with EMS.   Phenytoin and phenobarbital bottles in room have early Sept fill dates, 1 month supply.   12/30: Admitted after approximately 8 seizures back to back, loaded with Keppra; transferred to [facility], Neuro consulted, cEEG started --> Vimpat continued alone 12/31: Keppra added back 1/1: Seizures resolved; cEEG stopped     Assessment and Plan: * Seizures ([place]) Patient loaded with Vimpat and still having seizures.  Keppra added and seizures abated on cEEG and clinically improved.  Neurology recommend ending  - Continue Vimpat 200 BID and Keppra 1500 BID - Transition to oral tomorrow - Consult Neurology  - Gradually uptitrate gabapentin as tolerated to max dose of 600 mg 3 times daily    Nonadherence to medication See prior notes  Alcohol use disorder, severe, dependence (HCC) - Continue CIWA - Continue thiamine and folate  Hypokalemia Resolved  Hyperkalemia Transient.  Treated overnight and resolved. Might have just been hemolysis.  Acute metabolic encephalopathy Due to status epilepticus and alcohol withdrawal and post-ictal confusion.  Past experience caring for this patient by our Neurology team is that she has a prolonged post-ictal state.          Subjective: Still somnolent but more oriented today.  Aware of situation, surroundings, thinks she is at [facility], but easily reoriented.  No fever,  no chest pain, no dyspnea, no cough, no abdominal pain     Physical Exam: BP 119/70   Pulse 85   Temp 98.3 F (36.8 C)   Resp 18   Ht 5\' 1"  (1.549 m)   Wt 62.2 kg   SpO2 99%   BMI 25.91 kg/m   Adult female, lying in bed, sleepy and somnolent RRR, no murmurs, no peripheral edema Respiratory rate normal, lungs clear without rales or wheezes Abdomen soft without tenderness palpation or guarding, no ascites or distention Attention diminished, affect blunted, very sleepy, face metric, speech fluent, severe generalized weakness    Data Reviewed: Discussed with neurology Potassium elevated overnight, normalized on repeat CBC unremarkable Glucose level overnight Creatinine stable, slightly acidotic overnight, normal repeat    Family Communication: None present    Disposition: Status is: Inpatient We will transition to oral antiepileptics tomorrow, work with physical therapy, likely ready for discharge by wednesday.        Author: Edwin Dada, MD 10/19/2022 6:05 PM  For on call review [email or web address].

## 2022-10-19 NOTE — Progress Notes (Addendum)
Hyperkalemia 6.3 with hypoglycemia 51.  Received IV calcium gluconate 1 g x 1, 1 dose of Lokelma 10 g x 1, 2 Amps of bicarb for high anion gap metabolic acidosis with bicarb of 13 and anion gap of 18.  Stat BMP and VBG are pending.  Difficulty obtaining blood draws.   Time: 15 minutes.

## 2022-10-19 NOTE — Progress Notes (Signed)
Neurology Progress Note   S:// Patient seen and examined in room. LTM in place.  States that she quit drinking one week ago and was not taking her medications because she didn't like how they made her feel. She is living at the [facility] in [place]. She states that she is barely able to walk because of pain and weakness. She tells me that the year is 37.  Current AEDs: Vimpat 200mg  BID and Keppra 1500mg  BID  Previous AEDs: phenobarbital, phenytoin, and keppra - Patient was non compliant with these medications. States she didn't like how they made her feel. States that since she was last discharged she never took any of her medications besides gabapentin.   To attending MD she reports that she tolerates Vimpat and gabapentin well but does not do well with phenytoin and phenobarbital.  States she is feeling fine on the [place] that she is currently on  O:// Current vital signs: BP 130/76 (BP Location: Left Arm)   Pulse 85   Temp 98.6 F (37 C)   Resp (!) 24   Ht 5\' 1"  (1.549 m)   Wt 62.2 kg   SpO2 99%   BMI 25.91 kg/m  Vital signs in last 24 hours: Temp:  [97.7 F (36.5 C)-98.6 F (37 C)] 98.6 F (37 C) (01/01 0745) Pulse Rate:  [65-85] 85 (01/01 0410) Resp:  [14-24] 24 (01/01 0745) BP: (130-147)/(65-97) 130/76 (01/01 0745) SpO2:  [99 %-100 %] 99 % (01/01 0745)  General: Awake alert in no distress HEENT: Normocephalic and atraumatic Lungs: Clear Neurological exam She is awake alert in no distress Speaking in full sentences. Oriented to self and location. States she is in the hospital because of a seizure, however she does state that the current year is "21" Follows commands.  Cranial nerves II to XII appear intact Motor examination  Reports pain in LUE, able to move bilateral upper extremities antigravity  Bilateral lower extremities are weak but equal  Sensation intact to light touch Gait: deferred for safety, patient states she has been having trouble walking  recently and has required a cane to ambulate even short distances.   Medications  Current Facility-Administered Medications:    acetaminophen (TYLENOL) tablet 650 mg, 650 mg, Oral, Q6H PRN **OR** acetaminophen (TYLENOL) suppository 650 mg, 650 mg, Rectal, Q6H PRN, Alcario Drought, Jared M, DO   dextrose 5 %-0.9 % sodium chloride infusion, , Intravenous, Continuous, Danford, Suann Larry, MD   dextrose 50 % solution 50 mL, 50 mL, Intravenous, PRN, Hall, Carole N, DO   enoxaparin (LOVENOX) injection 40 mg, 40 mg, Subcutaneous, Q24H, Alcario Drought, Jared M, DO, 40 mg at 13/08/65 7846   folic acid (FOLVITE) tablet 1 mg, 1 mg, Oral, Daily, Alcario Drought, Jared M, DO, 1 mg at 10/18/22 0930   lacosamide (VIMPAT) 200 mg in sodium chloride 0.9 % 25 mL IVPB, 200 mg, Intravenous, Q12H, Shafer, Devon, NP, Last Rate: 90 mL/hr at 10/18/22 2234, 200 mg at 10/18/22 2234   levETIRAcetam (KEPPRA) IVPB 1500 mg/ 100 mL premix, 1,500 mg, Intravenous, Q12H, Greta Doom, MD, Last Rate: 400 mL/hr at 10/18/22 2312, 1,500 mg at 10/18/22 2312   LORazepam (ATIVAN) tablet 1-4 mg, 1-4 mg, Oral, Q1H PRN **OR** LORazepam (ATIVAN) injection 1-4 mg, 1-4 mg, Intravenous, Q1H PRN, Etta Quill, DO, 2 mg at 10/18/22 0442   multivitamin with minerals tablet 1 tablet, 1 tablet, Oral, Daily, Alcario Drought, Jared M, DO, 1 tablet at 10/18/22 0930   ondansetron (ZOFRAN) tablet 4 mg, 4 mg, Oral,  Q6H PRN **OR** ondansetron (ZOFRAN) injection 4 mg, 4 mg, Intravenous, Q6H PRN, Julian Reil, Jared M, DO   thiamine (VITAMIN B1) injection 100 mg, 100 mg, Intravenous, Daily, Cardama, Amadeo Garnet, MD, 100 mg at 10/18/22 0928  Labs CBC    Component Value Date/Time   WBC 6.8 10/19/2022 0401   RBC 3.97 10/19/2022 0401   HGB 13.4 10/19/2022 0401   HCT 40.3 10/19/2022 0401   PLT 240 10/19/2022 0401   MCV 101.5 (H) 10/19/2022 0401   MCH 33.8 10/19/2022 0401   MCHC 33.3 10/19/2022 0401   RDW 14.8 10/19/2022 0401   LYMPHSABS 0.8 10/17/2022 0135   MONOABS  0.7 10/17/2022 0135   EOSABS 0.0 10/17/2022 0135   BASOSABS 0.0 10/17/2022 0135    CMP     Component Value Date/Time   NA 141 10/19/2022 0401   K 6.3 (HH) 10/19/2022 0401   CL 110 10/19/2022 0401   CO2 13 (L) 10/19/2022 0401   GLUCOSE 51 (L) 10/19/2022 0401   BUN 7 (L) 10/19/2022 0401   CREATININE 0.63 10/19/2022 0401   CALCIUM 8.9 10/19/2022 0401   PROT 6.4 (L) 10/18/2022 0348   ALBUMIN 3.4 (L) 10/18/2022 0348   AST 48 (H) 10/18/2022 0348   ALT 18 10/18/2022 0348   ALKPHOS 60 10/18/2022 0348   BILITOT 0.9 10/18/2022 0348   GFRNONAA >60 10/19/2022 0401   GFRAA >60 06/09/2020 0816    Imaging I have reviewed images in epic and the results pertinent to this consultation are: CT head with no acute changes.  EEG LTM 10/19/2022 0143 to 10/19/2022 0730  This study showed evidence of cortical dysfunction arising from bitemporal region likely secondary to post-ictal state. Additionally there is moderate diffuse encephalopathy, nonspecific etiology.  No seizures were noted during this study.   EEG LTM 10/18/2022 0143 to 10/19/2022 0143 Overnight EEG read with evidence of independent epileptogenicity and cortical dysfunction from both the right and the left temporal region which is on the ictal interictal continuum with hypertension for seizures.  Additionally moderate diffuse encephalopathy of nonspecific etiology seen.  Exacerbated in the background likely medication effect from benzos.   Assessment: 67 year old woman with recurrent seizures without return to baseline according to status epilepticus.  Prior history of noncompliance and alcohol abuse.  Came in with undetectable levels of Dilantin and phenobarbital.  Also reports of empty bottles from 3 months ago without any refills. Loaded with Keppra and Dilantin and continued on keppra 1500mg . Vimpat 200mg  added 12/31 with improvement in EEG and clinical exam. Will discontinue the LTM today.   Gabapentin is useful for pain control as well  as seizure prophylaxis and she is more likely to be adherent to this medication.  Given her significant underlying bitemporal epilepsy, I think it is reasonable for her to be on 3 agents and there is controversy over how strong an antiseizure medication gabapentin is but I do think it would offer her some protection to increase it  Impression: Breakthrough seizure and status epilepticus in the setting of noncompliance, alcohol abuse history  Recommendations: Contiue Vimpat 200 twice daily Continue Keppra 1500 twice daily Gradually uptitrate gabapentin as tolerated to max dose of 600 mg 3 times daily (based on current creatinine clearance of 57.7 mL/min); will start with 300 mg nightly for now, further titration based on tolerance and per primary team Maintain seizure precautions, discussed alcohol cessation and medication adherence at length with the patient Discontinue LTM Check CBC and BMP.  Replete electrolytes as necessary-per primary team.  Neurology will be available as needed going forward, please reach out if any questions or concerns arise   Discussed [facility] statutes, patients with seizures are not allowed to drive until they have been seizure-free for six months Use caution when using heavy equipment or power tools. Avoid working on ladders or at heights. Take showers instead of baths. Ensure the water temperature is not too high on the home water heater. Do not go swimming alone. Do not lock yourself in a room alone (i.e. bathroom). When caring for infants or small children, sit down when holding, feeding, or changing them to minimize risk of injury to the child in the event you have a seizure. Maintain good sleep hygiene. Avoid alcohol.   -- Patient seen and examined by NP/APP with MD. MD to update note as needed.   Janine Ores, DNP, FNP-BC [facility] Pager: [phone]  Attending Neurologist's note:  I personally saw this patient, gathering history,  performing a full neurologic examination, reviewing relevant labs, personally reviewing relevant imaging including head CT, and formulated the assessment and plan, adding the note above for completeness and clarity to accurately reflect my thoughts

## 2022-10-20 DIAGNOSIS — R569 Unspecified convulsions: Secondary | ICD-10-CM | POA: Diagnosis not present

## 2022-10-20 LAB — BASIC METABOLIC PANEL
Anion gap: 7 (ref 5–15)
BUN: 8 mg/dL (ref 8–23)
CO2: 22 mmol/L (ref 22–32)
Calcium: 8.5 mg/dL — ABNORMAL LOW (ref 8.9–10.3)
Chloride: 111 mmol/L (ref 98–111)
Creatinine, Ser: 0.64 mg/dL (ref 0.44–1.00)
GFR, Estimated: >60 mL/min (ref >60)
Glucose, Bld: 103 mg/dL — ABNORMAL HIGH (ref 70–99)
Potassium: 3.1 mmol/L — ABNORMAL LOW (ref 3.5–5.1)
Sodium: 140 mmol/L (ref 135–145)

## 2022-10-20 LAB — GLUCOSE, CAPILLARY
Glucose-Capillary: 102 mg/dL — ABNORMAL HIGH (ref 70–99)
Glucose-Capillary: 123 mg/dL — ABNORMAL HIGH (ref 70–99)
Glucose-Capillary: 103 mg/dL — ABNORMAL HIGH (ref 70–99)
Glucose-Capillary: 156 mg/dL — ABNORMAL HIGH (ref 70–99)

## 2022-10-20 LAB — CBC
HCT: 37 % (ref 36.0–46.0)
Hemoglobin: 12 g/dL (ref 12.0–15.0)
MCH: 33 pg (ref 26.0–34.0)
MCHC: 32.4 g/dL (ref 30.0–36.0)
MCV: 101.6 fL — ABNORMAL HIGH (ref 80.0–100.0)
Platelets: 289 10*3/uL (ref 150–400)
RBC: 3.64 MIL/uL — ABNORMAL LOW (ref 3.87–5.11)
RDW: 14.8 % (ref 11.5–15.5)
WBC: 4.6 10*3/uL (ref 4.0–10.5)
nRBC: 0 % (ref 0.0–0.2)

## 2022-10-20 MED ORDER — LACOSAMIDE 200 MG PO TABS
200.0000 mg | ORAL_TABLET | Freq: Two times a day (BID) | ORAL | Status: DC
  Administered 2022-10-21: 200 mg via ORAL
  Filled 2022-10-20: qty 1

## 2022-10-20 MED ORDER — POTASSIUM CHLORIDE CRYS ER 20 MEQ PO TBCR
40.0000 meq | EXTENDED_RELEASE_TABLET | Freq: Once | ORAL | Status: AC
  Administered 2022-10-20: 40 meq via ORAL
  Filled 2022-10-20: qty 2

## 2022-10-20 MED ORDER — THIAMINE MONONITRATE 100 MG PO TABS
100.0000 mg | ORAL_TABLET | Freq: Every day | ORAL | Status: DC
  Administered 2022-10-21: 100 mg via ORAL
  Filled 2022-10-20: qty 1

## 2022-10-20 MED ORDER — LEVETIRACETAM 750 MG PO TABS
1500.0000 mg | ORAL_TABLET | Freq: Two times a day (BID) | ORAL | Status: DC
  Administered 2022-10-21: 1500 mg via ORAL
  Filled 2022-10-20: qty 2

## 2022-10-20 NOTE — Progress Notes (Signed)
  Progress Note   Patient: [name] OVF:[id number] DOB: Oct 18, 1956 DOA: 10/17/2022     2 DOS: the patient was seen and examined on 10/20/2022 at 9:05AM      Brief hospital course: 67 y.o. female with medical history significant of EtOH abuse, seizures, pancreatitis.  Tendency to come in to ED with seizures due to combination of not taking seizure meds and possibly superimposed EtOH withdrawal.   She apparently had approximately eight seizures at home prior to being transported by EMS, and then had to with EMS. She received a total of 10 mg of Versed with EMS.   Phenytoin and phenobarbital bottles in room have early Sept fill dates, 1 month supply.   12/30: Admitted after approximately 8 seizures back to back, loaded with Keppra; transferred to [facility], Neuro consulted, cEEG started --> Vimpat continued alone 12/31: Keppra added back 1/1: Seizures resolved; cEEG stopped     Assessment and Plan: * Seizures ([place]) Patient loaded initially with Vimpat and still had seizures.  Keppra added and seizures abated on cEEG and clinically improved.  Neurology have signed off. - Continue Vimpat 200 BID and Keppra 1500 BID --> transition to oral tomorrow - Continue gabapentin - At discharge, titrate gabapentin to 300 BID with plan to eventually take 600 TID    Nonadherence to medication Evidently phenytoin and phenobarbital bottles found by EMS in her home have Sept fill dates.  Dilantin level here low.    Alcohol use disorder, severe, dependence (HCC) - Continue CIWA - Continue thiamine and folate  Hypokalemia Resolved  Hyperkalemia Transient.  Treated overnight and resolved. Might have just been hemolysis.  Acute metabolic encephalopathy Due to status epilepticus and alcohol withdrawal and post-ictal confusion.  Past experience caring for this patient by our Neurology team is that she has a prolonged post-ictal state.  Now resolved.         Subjective: Still extremely  weak but improving.     Physical Exam: BP (!) 141/80 (BP Location: Right Arm)   Pulse 83   Temp 99.1 F (37.3 C) (Oral)   Resp 14   Ht 5\' 1"  (1.549 m)   Wt 62.2 kg   SpO2 99%   BMI 25.91 kg/m   Adult female, lying in bed, sleepy and somnolent RRR, no murmurs, no peripheral edema Respiratory rate normal, lungs clear without rales or wheezes Abdomen soft without tenderness palpation or guarding, no ascites or distention Attention diminished, affect blunted, very sleepy, face metric, speech fluent, severe generalized weakness    Data Reviewed: Discussed with neurology Potassium elevated overnight, normalized on repeat CBC unremarkable Glucose level overnight Creatinine stable, slightly acidotic overnight, normal repeat    Family Communication: None present    Disposition: Status is: Inpatient We will transition to oral antiepileptics tomorrow, work with physical therapy, likely ready for discharge by wednesday.        Author: Edwin Dada, MD 10/20/2022 5:26 PM  For on call review [email or web address].

## 2022-10-20 NOTE — Evaluation (Addendum)
Occupational Therapy Evaluation Patient Details Name: [name] MRN: [id number] DOB: September 08, 1956 Today's Date: 10/20/2022   History of Present Illness 67 y.o. female apparently had approximately eight seizures at home prior to being transported by EMS to ED 10/17/22. EEG +epileptogenicity from bil temporal lobes with moderate diffuse encephalopathy  PMH: alcohol abuse, alcohol induced seizures, depression   Clinical Impression   At baseline, [name] lives with her fiance [name] in the [facility], where she ambulates with a cane and is independent with ADL tasks. [name] completes IADL tasks)Pt with apparent cognitive deficits as noted below and requires min A with mobility and ADL tasks with the exception of max A with pericare as pt was unaware of being incontinent of BM while sitting in bed. HR @ 124 with activity. Pt would benefit from rehab at [facility] however she states she will not go to rehab and wants to go home with [employer]. She will need direct assistance with medication management. Acute OT will follow however pt will most likely not be safe to DC tomorrow. Discussed with PT. Will have mobility tech see as well.      Recommendations for follow up therapy are one component of a multi-disciplinary discharge planning process, led by the attending physician.  Recommendations may be updated based on patient status, additional functional criteria and insurance authorization.   Follow Up Recommendations  Home health OT     Assistance Recommended at Discharge Frequent or constant Supervision/Assistance  Patient can return home with the following      Functional Status Assessment  Patient has had a recent decline in their functional status and demonstrates the ability to make significant improvements in function in a reasonable and predictable amount of time.  Equipment Recommendations   (RW)    Recommendations for Other Services PT consult     Precautions / Restrictions  Precautions Precautions: Fall Precaution Comments: seizures      Mobility Bed Mobility Overal bed mobility: Needs Assistance Bed Mobility: Supine to Sit     Supine to sit: Min guard          Transfers Overall transfer level: Needs assistance Equipment used: Rolling walker (2 wheels) Transfers: Sit to/from Stand Sit to Stand: Min assist                  Balance Overall balance assessment: Needs assistance   Sitting balance-Leahy Scale: Good       Standing balance-Leahy Scale: Poor                             ADL either performed or assessed with clinical judgement   ADL Overall ADL's : Needs assistance/impaired     Grooming: Set up;Supervision/safety   Upper Body Bathing: Set up;Supervision/ safety   Lower Body Bathing: Minimal assistance;Sit to/from stand   Upper Body Dressing : Minimal assistance   Lower Body Dressing: Minimal assistance;Sit to/from stand   Toilet Transfer: Minimal assistance;Ambulation;Rolling walker (2 wheels)   Toileting- Clothing Manipulation and Hygiene: Maximal assistance Toileting - Clothing Manipulation Details (indicate cue type and reason): incontinenet of BM in bed     Functional mobility during ADLs: Minimal assistance;Rolling walker (2 wheels)       Vision Baseline Vision/History: 1 Wears glasses Vision Assessment?: Yes Eye Alignment: Within Functional Limits Ocular Range of Motion: Within Functional Limits Alignment/Gaze Preference: Within Defined Limits Tracking/Visual Pursuits: Decreased smoothness of horizontal tracking;Decreased smoothness of vertical tracking Saccades: Decreased speed of saccadic movement  Visual Fields: No apparent deficits Additional Comments: seeing tour buses outside     [employer]      Pertinent Vitals/Pain Pain Assessment Pain Assessment: 0-10 Pain Score: 8  Pain Location: "whole body" Pain Descriptors / Indicators: Sore Pain Intervention(s): Limited  activity within patient's tolerance     Hand Dominance Right   Extremity/Trunk Assessment Upper Extremity Assessment Upper Extremity Assessment: RUE deficits/detail;LUE deficits/detail RUE Deficits / Details: generalized weakness; bruising on upper arm; apparent RTC insufficiency - most likely baseline as she was seen in August and had difficulty moving her shoulder RUE Coordination: decreased gross motor LUE Deficits / Details: able to use functionall   Lower Extremity Assessment Lower Extremity Assessment: Defer to PT evaluation   Cervical / Trunk Assessment Cervical / Trunk Assessment: Normal   Communication Communication Communication: Expressive difficulties (pt states she is having more difficulty "getting out the right words")   Cognition Arousal/Alertness: Awake/alert Behavior During Therapy: WFL for tasks assessed/performed Overall Cognitive Status: Impaired/Different from baseline Area of Impairment: Orientation, Attention, Memory, Following commands, Safety/judgement, Awareness, Problem solving                 Orientation Level: Disoriented to, Time Current Attention Level: Selective Memory: Decreased short-term memory, Decreased recall of precautions Following Commands: Follows one step commands consistently Safety/Judgement: Decreased awareness of safety, Decreased awareness of deficits Awareness: Emergent Problem Solving: Slow processing General Comments: Short Blessed Test : Difficulty with immediate recall; scored a 12, which places her in the impaired category; made 1 error with stating months in reverse order; 0/5 recall on name and address  Seeing tour buses outside - unsure if hallucinating or due to poor vision   General Comments       Exercises     Shoulder Instructions      Home Living Family/patient expects to be discharged to:: Private residence Living Arrangements: Spouse/significant other Available Help at Discharge: Available 24  hours/day Type of Home: Other(Comment) (hotel) Home Access: Level entry (hotel)     Home Layout: One level     Bathroom Shower/Tub: Occupational psychologist: Standard Bathroom Accessibility: Yes How Accessible: Accessible via walker Home Equipment: [name] - single point   Additional Comments: [place] - has lived there with [name]; both drink      Prior Functioning/Environment Prior Level of Function : Independent/Modified Independent (drinks 4-6 beers daily)             Mobility Comments: uses the cane for mobility          OT Problem List: Decreased strength;Decreased range of motion;Decreased activity tolerance;Impaired balance (sitting and/or standing);Impaired vision/perception;Decreased coordination;Decreased cognition;Decreased safety awareness;Impaired UE functional use      OT Treatment/Interventions: Self-care/ADL training;Therapeutic exercise;DME and/or AE instruction;Therapeutic activities;Cognitive remediation/compensation;Visual/perceptual remediation/compensation;Patient/family education;Balance training    OT Goals(Current goals can be found in the care plan section) Acute Rehab OT Goals Patient Stated Goal: to go back to her hotel OT Goal Formulation: With patient Time For Goal Achievement: 11/03/22 Potential to Achieve Goals: Good  OT Frequency: Min 2X/week    Co-evaluation              AM-PAC OT "6 Clicks" Daily Activity     Outcome Measure Help from another person eating meals?: None Help from another person taking care of personal grooming?: A Little Help from another person toileting, which includes using toliet, bedpan, or urinal?: A Lot Help from another person bathing (including washing, rinsing, drying)?: A Little  Help from another person to put on and taking off regular upper body clothing?: A Little Help from another person to put on and taking off regular lower body clothing?: A Little 6 Click Score: 18   End of  Session Equipment Utilized During Treatment: Gait belt;Rolling walker (2 wheels) Nurse Communication: Mobility status  Activity Tolerance: Patient tolerated treatment well Patient left: in chair;with call bell/phone within reach;with chair alarm set  OT Visit Diagnosis: Unsteadiness on feet (R26.81);Other abnormalities of gait and mobility (R26.89);Muscle weakness (generalized) (M62.81);Other symptoms and signs involving the nervous system (R29.898);Other symptoms and signs involving cognitive function;Pain Pain - part of body:  (all over)                Time: 8527-7824 OT Time Calculation (min): 63 min Charges:  OT General Charges $OT Visit: 1 Visit OT Evaluation $OT Eval Moderate Complexity: 1 Mod OT Treatments $Self Care/Home Management : 38-52 mins  Maurie Boettcher, OT/L   Acute OT Clinical Specialist [place] Pager [phone] Office [phone]   [facility] 10/20/2022, 10:36 AM

## 2022-10-20 NOTE — TOC Initial Note (Signed)
Transition of Care [facility]) - Initial/Assessment Note    Patient Details  Name: [name] MRN: [id number] Date of Birth: Jul 18, 1956  Transition of Care [facility]) CM/SW Contact:    Pollie Friar, RN Phone Number: 10/20/2022, 12:28 PM  Clinical Narrative:                 Patient lives in a motel [facility]) with her fiance. She does confirm that she doesn't take her medications as prescribed. She states she will try and do better after dc.  Pt will need transportation back to the motel when d/ced. CM will provide a cab voucher at time of d/c. CM has also changed pharmacy to [facility] so pt will have her medications at d/c.  Pt provided inpatient/ outpatient counseling resources for her alcohol abuse. She states she is interested in getting back into AA. Pt with recommendations for [facility] services. CM will see if able to arrange due to her alcohol use and living in motel. TOC following.  Expected Discharge Plan: [place] Barriers to Discharge: Continued Medical Work up   Patient Goals and CMS Choice   CMS Medicare.gov Compare Post Acute Care list provided to:: Patient Choice offered to / list presented to : Patient      Expected Discharge Plan and Services   Discharge Planning Services: CM Consult Post Acute Care Choice: Home Health, Durable Medical Equipment Living arrangements for the past 2 months: Hotel/Motel                 DME Arranged: Walker rolling DME Agency: [employer] Date DME Agency Contacted: 10/20/22   Representative spoke with at DME Agency: Brenton Grills            Prior Living Arrangements/Services Living arrangements for the past 2 months: Hotel/Motel Lives with:: Significant Other Patient language and need for interpreter reviewed:: Yes Do you feel safe going back to the place where you live?: Yes          Current home services: DME (cane) Criminal Activity/Legal Involvement Pertinent to Current  Situation/Hospitalization: No - Comment as needed  Activities of Daily Living Home Assistive Devices/Equipment: None ADL Screening (condition at time of admission) Patient's cognitive ability adequate to safely complete daily activities?: No Is the patient deaf or have difficulty hearing?: No Does the patient have difficulty seeing, even when wearing glasses/contacts?: No Does the patient have difficulty concentrating, remembering, or making decisions?: Yes Patient able to express need for assistance with ADLs?: No Does the patient have difficulty dressing or bathing?: Yes Independently performs ADLs?: No Communication: Needs assistance Is this a change from baseline?: Change from baseline, expected to last >3 days Dressing (OT): Dependent Is this a change from baseline?: Change from baseline, expected to last >3 days Grooming: Dependent Is this a change from baseline?: Change from baseline, expected to last >3 days Feeding: Dependent Is this a change from baseline?: Change from baseline, expected to last >3 days Bathing: Dependent Is this a change from baseline?: Change from baseline, expected to last >3 days Toileting: Dependent Is this a change from baseline?: Change from baseline, expected to last >3days In/Out Bed: Dependent Is this a change from baseline?: Change from baseline, expected to last >3 days Walks in Home: Dependent Is this a change from baseline?: Change from baseline, expected to last >3 days Does the patient have difficulty walking or climbing stairs?: Yes Weakness of Legs: Both Weakness of Arms/Hands: Both  Permission Sought/Granted  Emotional Assessment Appearance:: Appears stated age Attitude/Demeanor/Rapport: Engaged Affect (typically observed): Accepting Orientation: : Oriented to Self, Oriented to Place, Oriented to Situation   Psych Involvement: No (comment)  Admission diagnosis:  Status epilepticus ([place]) [G40.901] Seizures  ([place]) [N23.5] Acute metabolic encephalopathy [T73.22] Patient Active Problem List   Diagnosis Date Noted   Hyperkalemia 02/54/2706   Acute metabolic encephalopathy 23/76/2831   Seizures ([place]) 10/17/2022   Chronic pain    Hypoalbuminemia    Hyperglycemia    Nonadherence to medication    Seizure ([place]) 05/17/2022   Status epilepticus ([place])    Hypokalemia    Alcohol-induced mood disorder ([place])    Closed displaced oblique fracture of shaft of left humerus 08/01/2020   GERD (gastroesophageal reflux disease) 02/28/2020   Alcohol withdrawal syndrome, with delirium ([place]) 12/13/2018   Thrombocytopenia ([place]) 12/13/2018   Neuropathy 12/13/2018   Subarachnoid hemorrhage ([place]) 12/11/2018   Seizure due to alcohol withdrawal ([place]) 12/11/2018   Alcohol dependence with alcohol-induced mood disorder ([place])    Major depressive disorder, 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) 05/16/2018   PCP:  Care, [place]:   [id number] [place], [place] [address] Phone: [phone] Fax: [phone]  [facility] [address] [place] [address] Phone: [phone] Fax: [place] [address], [place]. [place] [address] Phone: [phone] Fax: [phone]     Social Determinants of Health (SDOH) Social History: [place]: No Food Insecurity (10/17/2022)  Housing: Low Risk  (10/17/2022)  Transportation Needs: No Transportation Needs (10/17/2022)  Utilities: Not At Risk (10/17/2022)  Alcohol Screen: Medium Risk (12/18/2018)  Depression (PHQ2-9): Medium Risk (05/10/2021)  Financial Resource Strain: Medium Risk (09/27/2018)  Physical Activity: Unknown (09/27/2018)  Social Connections: Unknown (09/27/2018)  Stress:  No Stress Concern Present (09/27/2018)  Tobacco Use: High Risk (10/17/2022)   SDOH Interventions: Housing Interventions: Intervention Not Indicated   Readmission Risk Interventions     No data to display

## 2022-10-20 NOTE — Progress Notes (Signed)
Physical Therapy Treatment Patient Details Name: [name] MRN: [id number] DOB: Jul 07, 1956 Today's Date: 10/20/2022   History of Present Illness 67 y.o. female apparently had approximately eight seizures at home prior to being transported by EMS to ED 10/17/22. EEG +epileptogenicity from bil temporal lobes with moderate diffuse encephalopathy  PMH: alcohol abuse, alcohol induced seizures, depression    PT Comments    Pt admitted secondary to problem above with deficits below. PTA patient was living with boyfriend at a hotel. She was ambulatory with a cane. Pt currently requires min assist with RW due to decr RLE strength, decr balance, and decr understanding of how to safely use RW. Patient may progress back to using a cane, however at this time recommend a RW and will continue to assess. Patient could benefit from [place], especially if discharges home using a new piece of DME (RW).  Anticipate patient will benefit from PT to address problems listed below.Will continue to follow acutely to maximize functional mobility independence and safety.      Recommendations for follow up therapy are one component of a multi-disciplinary discharge planning process, led by the attending physician.  Recommendations may be updated based on patient status, additional functional criteria and insurance authorization.  Follow Up Recommendations  Home health PT (if able to arrange)     Assistance Recommended at Discharge Intermittent Supervision/Assistance  Patient can return home with the following A little help with walking and/or transfers;Assistance with cooking/housework;Direct supervision/assist for medications management;Direct supervision/assist for financial management;Assist for transportation;Help with stairs or ramp for entrance   Equipment Recommendations  Rolling walker (2 wheels) (may progress to cane (which she already has))    Recommendations for Other Services       Precautions /  Restrictions Precautions Precautions: Fall Precaution Comments: seizures Restrictions Weight Bearing Restrictions: No     Mobility  Bed Mobility               General bed mobility comments: up in chair    Transfers Overall transfer level: Needs assistance Equipment used: Rolling walker (2 wheels) Transfers: Sit to/from Stand Sit to Stand: Min assist           General transfer comment: vc for proper use of RW x2 reps (from recliner and std toilet). Pt needed a boost to come up from toilet    Ambulation/Gait Ambulation/Gait assistance: Min guard Gait Distance (Feet): 12 Feet (toileted; 12) Assistive device: Rolling walker (2 wheels) Gait Pattern/deviations: Step-through pattern, Decreased stride length, Trunk flexed   Gait velocity interpretation: <1.8 ft/sec, indicate of risk for recurrent falls   General Gait Details: after washing hands, pt reported too fatigued to walk in hall "afraid my leg is going to give out" Requires max cues for safe use of RW; min assist to maneuver RW sideways into small bathroom   Stairs             Wheelchair Mobility    Modified Rankin (Stroke Patients Only)       Balance Overall balance assessment: Needs assistance   Sitting balance-Leahy Scale: Good     Standing balance support: Reliant on assistive device for balance Standing balance-Leahy Scale: Poor                              Cognition Arousal/Alertness: Awake/alert Behavior During Therapy: WFL for tasks assessed/performed Overall Cognitive Status: Impaired/Different from baseline Area of Impairment: Orientation, Attention, Memory, Following commands, Safety/judgement, Awareness, Problem solving  Orientation Level: Disoriented to, Time Current Attention Level: Selective Memory: Decreased short-term memory, Decreased recall of precautions Following Commands: Follows one step commands consistently Safety/Judgement:  Decreased awareness of safety, Decreased awareness of deficits Awareness: Emergent Problem Solving: Slow processing General Comments: Short Blessed Test : Difficulty with immediate recall; scored a 12, which places her in the impaired category; made 1 error with stating months in reverse order; 0/5 recall on name and address        Exercises      General Comments        Pertinent Vitals/Pain Pain Assessment Pain Assessment: 0-10 Pain Score: 8  Pain Location: "whole body" Pain Descriptors / Indicators: Sore Pain Intervention(s): Limited activity within patient's tolerance, Monitored during session, Repositioned    Home Living Family/patient expects to be discharged to:: Private residence Living Arrangements: Spouse/significant other Available Help at Discharge: Available 24 hours/day Type of Home: Other(Comment) (hotel) Home Access: Level entry (hotel)       Home Layout: One level Home Equipment: [name] - single point Additional Comments: [facility] [place] - has lived there with [name]; both drink    Prior Function            PT Goals (current goals can now be found in the care plan section) Acute Rehab PT Goals Patient Stated Goal: walk without walker PT Goal Formulation: With patient Time For Goal Achievement: 11/03/22 Potential to Achieve Goals: Good    Frequency    Min 3X/week      PT Plan      Co-evaluation              AM-PAC PT "6 Clicks" Mobility   Outcome Measure  Help needed turning from your back to your side while in a flat bed without using bedrails?: None Help needed moving from lying on your back to sitting on the side of a flat bed without using bedrails?: A Little Help needed moving to and from a bed to a chair (including a wheelchair)?: A Little Help needed standing up from a chair using your arms (e.g., wheelchair or bedside chair)?: A Little Help needed to walk in hospital room?: A Little Help needed climbing 3-5 steps with a  railing? : A Little 6 Click Score: 19    End of Session Equipment Utilized During Treatment: Gait belt Activity Tolerance: Patient limited by fatigue Patient left: in chair;with call bell/phone within reach;with chair alarm set   PT Visit Diagnosis: Unsteadiness on feet (R26.81);Other abnormalities of gait and mobility (R26.89)     Time: 9675-9163 PT Time Calculation (min) (ACUTE ONLY): 22 min  Charges:                         Arby Barrette, PT Acute Rehabilitation Services  Office [phone]    Rexanne Mano 10/20/2022, 3:35 PM

## 2022-10-20 NOTE — Plan of Care (Signed)

## 2022-10-21 ENCOUNTER — Other Ambulatory Visit (HOSPITAL_COMMUNITY): Payer: Self-pay

## 2022-10-21 DIAGNOSIS — R569 Unspecified convulsions: Secondary | ICD-10-CM | POA: Diagnosis not present

## 2022-10-21 LAB — BASIC METABOLIC PANEL
Anion gap: 13 (ref 5–15)
BUN: 5 mg/dL — ABNORMAL LOW (ref 8–23)
CO2: 23 mmol/L (ref 22–32)
Calcium: 9 mg/dL (ref 8.9–10.3)
Chloride: 106 mmol/L (ref 98–111)
Creatinine, Ser: 0.57 mg/dL (ref 0.44–1.00)
GFR, Estimated: >60 mL/min (ref >60)
Glucose, Bld: 92 mg/dL (ref 70–99)
Potassium: 3 mmol/L — ABNORMAL LOW (ref 3.5–5.1)
Sodium: 142 mmol/L (ref 135–145)

## 2022-10-21 LAB — MAGNESIUM: Magnesium: 1.5 mg/dL — ABNORMAL LOW (ref 1.7–2.4)

## 2022-10-21 LAB — GLUCOSE, CAPILLARY: Glucose-Capillary: 94 mg/dL (ref 70–99)

## 2022-10-21 LAB — LEVETIRACETAM LEVEL: Levetiracetam Lvl: <2 ug/mL — ABNORMAL LOW (ref 10.0–40.0)

## 2022-10-21 MED ORDER — LEVETIRACETAM 750 MG PO TABS
1500.0000 mg | ORAL_TABLET | Freq: Two times a day (BID) | ORAL | 0 refills | Status: DC
  Filled 2022-10-21: qty 120, 30d supply, fill #0

## 2022-10-21 MED ORDER — GABAPENTIN 300 MG PO CAPS
300.0000 mg | ORAL_CAPSULE | Freq: Two times a day (BID) | ORAL | 0 refills | Status: DC
  Filled 2022-10-21: qty 60, 30d supply, fill #0

## 2022-10-21 MED ORDER — LACOSAMIDE 200 MG PO TABS
200.0000 mg | ORAL_TABLET | Freq: Two times a day (BID) | ORAL | 0 refills | Status: DC
  Filled 2022-10-21: qty 60, 30d supply, fill #0

## 2022-10-21 NOTE — Consult Note (Signed)
   [facility] CM Inpatient Consult   10/21/2022  [name] 03-Jan-1956 [id number]  [place] [facility] [ACO] Patient: [name]  Primary Care Provider:  [facility] [this is not a [facility] provider listed] Called provider however the office is listed as closed today.   Patient screened for hospitalization with noted high  risk score for unplanned readmission risk. Review of patient's electronic medical record reveals patient is to transition home. Call placed to patient but unable to reach patient to confirm PCP however, already transitioned home.   Plan:  Sign off, not a [facility] provider  Of note, [place] does not replace or interfere with any arrangements made by the Inpatient Transition of Care team.  For questions contact:   Natividad Brood, RN BSN [place]  [phone] business mobile phone Toll free office [phone]  *[place]  [phone] Fax number: [phone] [email or web address]

## 2022-10-21 NOTE — Progress Notes (Signed)
[name] to be D/C'd Home per MD order.  Discussed with the patient and all questions fully answered.  VSS, Skin clean, dry and intact without evidence of skin break down, no evidence of skin tears noted. IV catheter discontinued intact. Site without signs and symptoms of complications. Dressing and pressure applied.  An After Visit Summary was printed and given to the patient. Patient received prescriptions from [facility].  D/c education completed with patient/family including follow up instructions, medication list, d/c activities limitations if indicated, with other d/c instructions as indicated by MD - patient able to verbalize understanding, all questions fully answered.   Patient instructed to return to ED, call 911, or call MD for any changes in condition.   Patient escorted via [place], and D/C home via [employer] taxi cab service.  [name] 10/21/2022 12:43 PM

## 2022-10-21 NOTE — TOC Transition Note (Signed)
Transition of Care [facility]) - CM/SW Discharge Note   Patient Details  Name: [name] MRN: [id number] Date of Birth: [birth year]  [facility]) CM/SW Contact:  Pollie Friar, RN Phone Number: 10/21/2022, 10:01 AM   Clinical Narrative:    Pt is discharging home with self care. CM is unable to arrange [facility] services due to pts living environment.  Walker for home is at the bedside.  Pts discharge medications are to be delivered to the room per [facility].  Cm has provided bedside RN with cab voucher for home.    Final next level of care: Home/Self Care Barriers to Discharge: No Barriers Identified   Patient Goals and CMS Choice CMS Medicare.gov Compare Post Acute Care list provided to:: Patient Choice offered to / list presented to : Patient  Discharge Placement                         Discharge Plan and Services Additional resources added to the After Visit Summary for     Discharge Planning Services: CM Consult Post Acute Care Choice: Home Health, Durable Medical Equipment          DME Arranged: Walker rolling DME Agency: [employer] Date DME Agency Contacted: 10/20/22   Representative spoke with at DME Agency: [place] Determinants of Health ([place]) Interventions SDOH Screenings   Food Insecurity: No Food Insecurity (10/17/2022)  Housing: Low Risk  (10/17/2022)  Transportation Needs: No Transportation Needs (10/17/2022)  Utilities: Not At Risk (10/17/2022)  Alcohol Screen: Medium Risk (12/18/2018)  Depression (PHQ2-9): Medium Risk (05/10/2021)  Financial Resource Strain: Medium Risk (09/27/2018)  Physical Activity: Unknown (09/27/2018)  Social Connections: Unknown (09/27/2018)  Stress: No Stress Concern Present (09/27/2018)  Tobacco Use: High Risk (10/17/2022)     Readmission Risk Interventions     No data to display

## 2022-10-21 NOTE — Plan of Care (Signed)
  Problem: Education: Goal: Knowledge of General Education information will improve Description: Including pain rating scale, medication(s)/side effects and non-pharmacologic comfort measures Outcome: Adequate for Discharge   Problem: Health Behavior/Discharge Planning: Goal: Ability to manage health-related needs will improve Outcome: Adequate for Discharge   Problem: Clinical Measurements: Goal: Ability to maintain clinical measurements within normal limits will improve Outcome: Adequate for Discharge Goal: Will remain free from infection Outcome: Adequate for Discharge Goal: Diagnostic test results will improve Outcome: Adequate for Discharge Goal: Respiratory complications will improve Outcome: Adequate for Discharge Goal: Cardiovascular complication will be avoided Outcome: Adequate for Discharge   Problem: Activity: Goal: Risk for activity intolerance will decrease Outcome: Adequate for Discharge   Problem: Nutrition: Goal: Adequate nutrition will be maintained Outcome: Adequate for Discharge   Problem: Coping: Goal: Level of anxiety will decrease Outcome: Adequate for Discharge   Problem: Elimination: Goal: Will not experience complications related to bowel motility Outcome: Adequate for Discharge Goal: Will not experience complications related to urinary retention Outcome: Adequate for Discharge   Problem: Pain Managment: Goal: General experience of comfort will improve Outcome: Adequate for Discharge   Problem: Safety: Goal: Ability to remain free from injury will improve Outcome: Adequate for Discharge   Problem: Skin Integrity: Goal: Risk for impaired skin integrity will decrease Outcome: Adequate for Discharge   RN reviewed discharge instruction with pt. RN provided eduction with teach back and answered the pt's questions. Pt stated that they understood the discharge instruction and all question were answered.

## 2022-10-21 NOTE — Care Management Important Message (Signed)
Important Message  Patient Details  Name: [name] MRN: [id number] Date of Birth: 01-28-1956   Medicare Important Message Given:  Yes     Hannah Beat 10/21/2022, 3:12 PM

## 2022-10-21 NOTE — Progress Notes (Addendum)
Physical Therapy Treatment Patient Details Name: [name] MRN: [id number] DOB: Jul 21, 1956 Today's Date: 10/21/2022   History of Present Illness 67 y.o. female apparently had approximately eight seizures at home prior to being transported by EMS to ED 10/17/22. EEG +epileptogenicity from bil temporal lobes with moderate diffuse encephalopathy  PMH: alcohol abuse, alcohol induced seizures, depression    PT Comments    Patient eager to ambulate with cane as she plans to go home today and does not want to use a RW. Patient did well with no loss of balance ambulating x 100 ft with cane and minguard assist. Continues with rt sided weakness and could benefit from HHPT, however if only one discipline can be arranged would favor OT due to RUE weakness > RLE weakness.     Recommendations for follow up therapy are one component of a multi-disciplinary discharge planning process, led by the attending physician.  Recommendations may be updated based on patient status, additional functional criteria and insurance authorization.  Follow Up Recommendations  Home health PT (if able to arrange)     Assistance Recommended at Discharge Set up Supervision/Assistance  Patient can return home with the following A little help with walking and/or transfers;Assistance with cooking/housework;Direct supervision/assist for medications management;Direct supervision/assist for financial management;Assist for transportation;Help with stairs or ramp for entrance   Equipment Recommendations  None recommended by PT    Recommendations for Other Services       Precautions / Restrictions Precautions Precautions: Fall Precaution Comments: seizures Restrictions Weight Bearing Restrictions: No     Mobility  Bed Mobility Overal bed mobility: Needs Assistance Bed Mobility: Supine to Sit     Supine to sit: Modified independent (Device/Increase time)          Transfers Overall transfer level: Needs  assistance Equipment used: Straight cane Transfers: Sit to/from Stand Sit to Stand: Supervision           General transfer comment: proper use of cane    Ambulation/Gait Ambulation/Gait assistance: Min guard, Supervision Gait Distance (Feet): 100 Feet Assistive device: Straight cane Gait Pattern/deviations: Step-through pattern, Decreased stride length, Trunk flexed       General Gait Details: good sequencing with cane; taking shorter  steps due to feeling "slightly off" no imbalance noted   Stairs             Wheelchair Mobility    Modified Rankin (Stroke Patients Only)       Balance Overall balance assessment: Needs assistance   Sitting balance-Leahy Scale: Good     Standing balance support: Reliant on assistive device for balance Standing balance-Leahy Scale: Poor                              Cognition Arousal/Alertness: Awake/alert Behavior During Therapy: WFL for tasks assessed/performed Overall Cognitive Status: Impaired/Different from baseline Area of Impairment: Safety/judgement, Awareness, Problem solving                         Safety/Judgement: Decreased awareness of safety, Decreased awareness of deficits Awareness: Emergent Problem Solving: Slow processing          Exercises      General Comments        Pertinent Vitals/Pain Pain Assessment Pain Assessment: 0-10 Pain Score: 7  Pain Location: "whole body" Pain Descriptors / Indicators: Sore Pain Intervention(s): Limited activity within patient's tolerance, Monitored during session    Home Living Family/patient expects  to be discharged to:: Private residence Living Arrangements: Spouse/significant other Available Help at Discharge: Available 24 hours/day Type of Home: Other(Comment) (hotel) Home Access: Level entry (hotel)       Home Layout: [place]: [name] - single point Additional Comments: [name] - has lived there with  [name]; both drink    Prior Function            PT Goals (current goals can now be found in the care plan section) Acute Rehab PT Goals Patient Stated Goal: walk without walker Time For Goal Achievement: 11/03/22 Potential to Achieve Goals: Good Progress towards PT goals: Progressing toward goals    Frequency    Min 3X/week      PT Plan Current plan remains appropriate    Co-evaluation              AM-PAC PT "6 Clicks" Mobility   Outcome Measure  Help needed turning from your back to your side while in a flat bed without using bedrails?: None Help needed moving from lying on your back to sitting on the side of a flat bed without using bedrails?: None Help needed moving to and from a bed to a chair (including a wheelchair)?: A Little Help needed standing up from a chair using your arms (e.g., wheelchair or bedside chair)?: A Little Help needed to walk in hospital room?: A Little Help needed climbing 3-5 steps with a railing? : A Little 6 Click Score: 20    End of Session Equipment Utilized During Treatment: Gait belt Activity Tolerance: Patient tolerated treatment well Patient left: in chair;with call bell/phone within reach;with chair alarm set Nurse Communication: Other (comment) (wants a shampoo cap) PT Visit Diagnosis: Unsteadiness on feet (R26.81);Other abnormalities of gait and mobility (R26.89)     Time: 9379-0240 PT Time Calculation (min) (ACUTE ONLY): 12 min  Charges:  $Gait Training: 8-22 mins                      [place]  Office [phone]    Rexanne Mano 10/21/2022, 8:23 AM

## 2022-10-21 NOTE — Discharge Summary (Signed)
[facility]  Physician Discharge Summary   Patient ID: [name] MRN: [id number] DOB/AGE: 68-Aug-1957 1 y.o.  Admit date: 10/17/2022 Discharge date: 10/21/2022    PCP: [facility]  DISCHARGE DIAGNOSES:    Seizures (HCC)   Alcohol use disorder, severe, dependence (HCC)   Nonadherence to medication   Hypokalemia   Acute metabolic encephalopathy   Hyperkalemia   RECOMMENDATIONS FOR OUTPATIENT FOLLOW UP: Ambulatory referral sent to neurology   Home Health: None Equipment/Devices: None  CODE STATUS: Full code  DISCHARGE CONDITION: fair  Diet recommendation: As before  INITIAL HISTORY: 67 y.o. female with medical history significant of EtOH abuse, seizures, pancreatitis.  Tendency to come in to ED with seizures due to combination of not taking seizure meds and possibly superimposed EtOH withdrawal.   She apparently had approximately eight seizures at home prior to being transported by EMS, and then had to with EMS. She received a total of 10 mg of Versed with EMS.   Phenytoin and phenobarbital bottles in room have early Sept fill dates, 1 month supply.     12/30: Admitted after approximately 8 seizures back to back, loaded with Keppra; transferred to [facility] Neuro consulted, cEEG started --> Vimpat continued alone 12/31: Keppra added back 1/1: Seizures resolved; cEEG stopped    HOSPITAL COURSE:   Seizures (HCC) Patient loaded initially with Vimpat and still had seizures.  Keppra added and seizures abated on cEEG and clinically improved. Neurology have signed off. - Continue Vimpat 200 BID and Keppra 1500 BID --> transition to oral tomorrow - Continue gabapentin - At discharge, titrate gabapentin to 300 BID with plan to eventually take 600 TID  Nonadherence to medication Evidently phenytoin and phenobarbital bottles found by EMS in her home have Sept fill dates.  Dilantin level here low.     Alcohol use disorder, severe, dependence  (HCC)   Hypokalemia and hypomagnesemia Will be repleted prior to discharge.   Acute metabolic encephalopathy Due to status epilepticus and alcohol withdrawal and post-ictal confusion.  Past experience caring for this patient by our Neurology team is that she has a prolonged post-ictal state. Now resolved.  Patient is stable.  Feels better.  Okay for discharge today.  PERTINENT LABS:  The results of significant diagnostics from this hospitalization (including imaging, microbiology, ancillary and laboratory) are listed below for reference.     Labs:   Basic Metabolic Panel: Recent Labs  Lab 10/17/22 0135 10/17/22 0548 10/18/22 0348 10/19/22 0401 10/19/22 0842 10/20/22 0626 10/21/22 0717  NA 136   < > 136 141 138 140 142  K 3.0*   < > 2.7* 6.3* 3.8 3.1* 3.0*  CL 101   < > 105 110 105 111 106  CO2 16*   < > 21* 13* 21* 22 23  GLUCOSE 238*   < > 80 51* 109* 103* 92  BUN 5*   < > <5* 7* 5* 8 5*  CREATININE 0.90   < > 0.63 0.63 0.75 0.64 0.57  CALCIUM 10.0   < > 8.5* 8.9 8.9 8.5* 9.0  MG 1.9  --   --   --   --   --  1.5*   < > = values in this interval not displayed.   Liver Function Tests: Recent Labs  Lab 10/17/22 0135 10/18/22 0348  AST 43* 48*  ALT 16 18  ALKPHOS 88 60  BILITOT 0.2* 0.9  PROT 7.7 6.4*  ALBUMIN 4.2 3.4*   CBC: Recent Labs  Lab 10/17/22 0135 10/17/22 0548 10/18/22 0348 10/19/22 0401 10/20/22 0626  WBC 10.5 8.9 8.4 6.8 4.6  NEUTROABS 8.9*  --   --   --   --   HGB 14.4 13.0 14.1 13.4 12.0  HCT 44.9 38.6 42.1 40.3 37.0  MCV 103.9* 99.7 99.1 101.5* 101.6*  PLT 260 242 204 240 289   Cardiac Enzymes: Recent Labs  Lab 10/17/22 0548  CKTOTAL 465*    CBG: Recent Labs  Lab 10/20/22 0606 10/20/22 1123 10/20/22 1650 10/20/22 2132 10/21/22 0622  GLUCAP 102* 123* 103* 156* 94     IMAGING STUDIES Overnight EEG with video  Result Date: 10/17/2022 Lora Havens, MD     10/18/2022  7:35 AM Patient Name: [name] MRN:  [id number] Epilepsy Attending: Lora Havens Referring Physician/Provider: Greta Doom, MD Duration: 10/17/2022 0143 to 10/18/2022 0143 Patient history:  67 year old female with refractory recurrent seizures without return to baseline. EEG to evaluate for status epilepticus. Level of alertness: Awake, asleep AEDs during EEG study: LEV, Ativan Technical aspects: This EEG study was done with scalp electrodes positioned according to the 10-20 International system of electrode placement. Electrical activity was reviewed with band pass filter of 1-70Hz , sensitivity of 7 uV/mm, display speed of 9mm/sec with a 60Hz  notched filter applied as appropriate. EEG data were recorded continuously and digitally stored.  Video monitoring was available and reviewed as appropriate. Description: No clear posterior dominant rhythm was seen. Sleep was characterized by sleep spindles (12-14hz ), maximal fronto-central region. EEG showed continuous generalized 3 to 6 Hz theta-delta slowing admixed with an excessive amount of 15 to 18 Hz, beta activity distributed symmetrically and diffusely. Intermittent rhythmic 2-3Hz  delta slowing was also noted in right> left temporal region. Independent spikes were noted in left and right temporal region, at times periodic at 1-2hz  as well as rhythmic lasting 3-9 seconds consistent with brief ictal-interictal rhythmic discharges ( BIRDs). Hyperventilation and photic stimulation were not performed.   ABNORMALITY - Brief ictal-interictal rhythmic discharges, left and right temporal region ( BIRDs) - Intermittent rhythmic delta slow, right> left temporal region - Continuous slow, generalized - Excessive beta, generalized IMPRESSION: This study showed evidence of independent epileptogenicity and cortical dysfunction arising from left and right temporal region which is on the ictal-interictal continuum with high potential for seizures. Additionally there is moderate diffuse encephalopathy,  nonspecific etiology. The excessive beta activity seen in the background is most likely due to the effect of benzodiazepine and is a benign EEG pattern. Lora Havens   CT Head Wo Contrast  Result Date: 10/17/2022 CLINICAL DATA:  Status post seizure. EXAM: CT HEAD WITHOUT CONTRAST TECHNIQUE: Contiguous axial images were obtained from the base of the skull through the vertex without intravenous contrast. RADIATION DOSE REDUCTION: This exam was performed according to the departmental dose-optimization program which includes automated exposure control, adjustment of the mA and/or kV according to patient size and/or use of iterative reconstruction technique. COMPARISON:  May 17, 2022 FINDINGS: Brain: There is mild cerebral atrophy with widening of the extra-axial spaces and ventricular dilatation. There are areas of decreased attenuation within the white matter tracts of the supratentorial brain, consistent with microvascular disease changes. Vascular: No hyperdense vessel or unexpected calcification. Skull: Normal. Negative for fracture or focal lesion. Sinuses/Orbits: A 12 mm x 5 mm right maxillary sinus polyp versus mucous retention cyst is noted. Other: None. IMPRESSION: 1. No acute intracranial abnormality. 2. Generalized cerebral atrophy with widening of the extra-axial spaces and ventricular dilatation. 3.  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 4. 12 mm x 5 mm right maxillary sinus polyp versus mucous retention cyst. Electronically Signed   By: Virgina Norfolk M.D.   On: 10/17/2022 01:10    DISCHARGE EXAMINATION: Vitals:   10/20/22 2351 10/21/22 0330 10/21/22 0726 10/21/22 1125  BP: (!) 144/81 133/84 (!) 142/86 (!) 130/92  Pulse: 78 75 73 87  Resp: 16 15 16 16   Temp: 98.4 F (36.9 C) 97.9 F (36.6 C) 98.4 F (36.9 C) 98.7 F (37.1 C)  TempSrc: Oral Oral Oral Oral  SpO2: 97% 96% 99% 100%  Weight:      Height:       General appearance: Awake alert.  In no  distress Resp: Clear to auscultation bilaterally.  Normal effort Cardio: S1-S2 is normal regular.  No S3-S4.  No rubs murmurs or bruit GI: Abdomen is soft.  Nontender nondistended.  Bowel sounds are present normal.  No masses organomegaly  DISPOSITION: Home  Discharge Instructions     Ambulatory referral to Neurology   Complete by: As directed    An appointment is requested in approximately: 8 weeks for seizures   Call MD for:  extreme fatigue   Complete by: As directed    Call MD for:  persistant dizziness or light-headedness   Complete by: As directed    Call MD for:  persistant nausea and vomiting   Complete by: As directed    Call MD for:  severe uncontrolled pain   Complete by: As directed    Call MD for:  temperature >100.4   Complete by: As directed    Diet general   Complete by: As directed    Discharge instructions   Complete by: As directed    Please take your medications as prescribed.  Referral has been sent to neurology.  Follow-up with your primary care provider within 1 week.  Please stop alcohol consumption.  You were cared for by a hospitalist during your hospital stay. If you have any questions about your discharge medications or the care you received while you were in the hospital after you are discharged, you can call the unit and asked to speak with the hospitalist on call if the hospitalist that took care of you is not available. Once you are discharged, your primary care physician will handle any further medical issues. Please note that NO REFILLS for any discharge medications will be authorized once you are discharged, as it is imperative that you return to your primary care physician (or establish a relationship with a primary care physician if you do not have one) for your aftercare needs so that they can reassess your need for medications and monitor your lab values. If you do not have a primary care physician, you can call [phone] for a physician referral.    Increase activity slowly   Complete by: As directed           Allergies as of 10/21/2022       Reactions   Keflex [cephalexin] Hives   Cephalosporins Hives   Prednisone Swelling   Toradol [ketorolac Tromethamine] Hives   Ultram [tramadol] Hives        Medication List     STOP taking these medications    PHENObarbital 60 MG tablet Commonly known as: LUMINAL   phenytoin 100 MG ER capsule Commonly known as: Dilantin       TAKE these medications    acetaminophen 325 MG tablet Commonly known as: TYLENOL Take  2 tablets (650 mg total) by mouth every 4 (four) hours as needed for fever, mild pain, moderate pain or headache. Notes to patient: Last dose given 10/20/22 at 06:00 PM   FLUoxetine 20 MG capsule Commonly known as: PROZAC Take 1 capsule (20 mg total) by mouth daily. Notes to patient: Continue home Schedule   folic acid 1 MG tablet Commonly known as: FOLVITE Take 1 mg by mouth daily. Notes to patient: Last dose given 10/21/22 10AM   gabapentin 300 MG capsule Commonly known as: NEURONTIN Take 1 capsule (300 mg total) by mouth 2 (two) times daily. What changed:  medication strength how much to take when to take this Notes to patient: Last dose given 10/20/22 at 09:00PM   ibuprofen 200 MG tablet Commonly known as: ADVIL Take 200 mg by mouth every 6 (six) hours as needed for fever, headache or mild pain.   lacosamide 200 MG Tabs tablet Commonly known as: VIMPAT Take 1 tablet (200 mg total) by mouth 2 (two) times daily. Notes to patient: Last dose given 10/21/22 at 10AM   levETIRAcetam 750 MG tablet Commonly known as: KEPPRA Take 2 tablets (1,500 mg total) by mouth 2 (two) times daily. What changed:  medication strength how much to take Notes to patient: Last dose given 10/21/22 10am   naltrexone 50 MG tablet Commonly known as: DEPADE Take 50 mg by mouth daily.   thiamine 100 MG tablet Commonly known as: VITAMIN B1 Take 1 tablet (100 mg total) by mouth  daily. Notes to patient: Last dose given 10/21/22 at 10AM          Follow-up Information     [facility]. Schedule an appointment as soon as possible for a visit in 1 week(s).   Contact information: [address] [place] [address] [phone]                 TOTAL DISCHARGE TIME: 35 minutes  Jorryn Hershberger Rito Ehrlich  [facility] Pager on [email or web address]  10/22/2022, 11:45 AM

## 2022-10-21 NOTE — Progress Notes (Signed)
Occupational Therapy Treatment Patient Details Name: [name] MRN: [id number] DOB: [birth year] Today's Date: 10/21/2022   History of present illness 67 y.o. female apparently had approximately eight seizures at home prior to being transported by EMS to ED 10/17/22. EEG +epileptogenicity from bil temporal lobes with moderate diffuse encephalopathy  PMH: alcohol abuse, alcohol induced seizures, depression   OT comments  Pt making steady progress towards OT goals this session. Pt continues to present with RUE weakness ( both proximally and distally however proximal deficits seem to be baseline from RTC injury), and cognitive impairments. Pt currently requires min guard for ADL transfers with [facility], supervision for 3/3 toileting tasks and MIN A for grooming taks as pt needed assist problem solving how to retrieve paper towels. Issued pt RUE HEP for home with pt completing therex as indicated below. Pt would continue to benefit from skilled occupational therapy while admitted and after d/c to address the below listed limitations in order to improve overall functional mobility and facilitate independence with BADL participation. DC plan remains appropriate, will follow acutely per POC.      Recommendations for follow up therapy are one component of a multi-disciplinary discharge planning process, led by the attending physician.  Recommendations may be updated based on patient status, additional functional criteria and insurance authorization.    Follow Up Recommendations  Home health OT     Assistance Recommended at Discharge Frequent or constant Supervision/Assistance  Patient can return home with the following  A little help with walking and/or transfers;A little help with bathing/dressing/bathroom;Direct supervision/assist for financial management;Assist for transportation;Direct supervision/assist for medications management   Equipment Recommendations  Other (comment) (RW)     Recommendations for Other Services      Precautions / Restrictions Precautions Precautions: Fall Precaution Comments: seizures Restrictions Weight Bearing Restrictions: No       Mobility Bed Mobility               General bed mobility comments: up in chair    Transfers Overall transfer level: Needs assistance Equipment used: Straight cane Transfers: Sit to/from Stand Sit to Stand: Supervision           General transfer comment: supervision for safety     Balance Overall balance assessment: Needs assistance Sitting-balance support: No upper extremity supported, Feet supported Sitting balance-Leahy Scale: Good     Standing balance support: No upper extremity supported, During functional activity Standing balance-Leahy Scale: Fair Standing balance comment: able to stand at sink for hand hygiene with no UE support or LOB                           ADL either performed or assessed with clinical judgement   ADL Overall ADL's : Needs assistance/impaired     Grooming: Wash/dry hands;Standing;Minimal assistance Grooming Details (indicate cue type and reason): assist needed to problem solve how to retrieve paper towels from dispenser     Lower Body Bathing: Supervison/ safety;Sit to/from stand Lower Body Bathing Details (indicate cue type and reason): simulated via pericare         Toilet Transfer: Min guard;Ambulation;Regular Toilet;Grab bars (cane)   Toileting- Clothing Manipulation and Hygiene: Supervision/safety;Sit to/from stand       Functional mobility during ADLs: Min guard;Cane General ADL Comments: ADl participation impacted by impaired RUE strength/ AROM and  impaired cog    Extremity/Trunk Assessment Upper Extremity Assessment Upper Extremity Assessment: Generalized weakness;RUE deficits/detail RUE Deficits / Details: generalized weakness; bruising on  upper arm; apparent RTC insufficiency - most likely baseline as she was seen in  August and had difficulty moving her shoulder, impaired AROM in RUE needing assist to reach to flush toilet, bilateral hands noted to be swollen RUE Sensation: WNL RUE Coordination: decreased gross motor LUE Deficits / Details: able to use functionall   Lower Extremity Assessment Lower Extremity Assessment: Defer to PT evaluation RLE Deficits / Details: quads 3+/5   Cervical / Trunk Assessment Cervical / Trunk Assessment: Normal    Vision Baseline Vision/History: 1 Wears glasses (unable to read small print, doesnt have glassess here) Patient Visual Report: No change from baseline     Perception Perception Perception: Within Functional Limits   Praxis Praxis Praxis: Intact    Cognition Arousal/Alertness: Awake/alert Behavior During Therapy: WFL for tasks assessed/performed Overall Cognitive Status: Impaired/Different from baseline Area of Impairment: Safety/judgement, Awareness, Problem solving                         Safety/Judgement: Decreased awareness of safety, Decreased awareness of deficits Awareness: Emergent Problem Solving: Slow processing General Comments: issued BIMS ( brief interview for mental status) with pt able to recall 3/3 words correctly with no cues. pt overall slow to process and seems to have imparied awareness into deficits        Exercises Hand Exercises Digit Composite Flexion: Strengthening, Right Composite Extension: Strengthening, Right Digit Composite Abduction: Strengthening, Right Digit Composite Adduction: Strengthening, Right Digit Lifts: Strengthening, Right Thumb Abduction: Strengthening, Right Thumb Adduction: Strengthening, Right Opposition: Strengthening, Right Other Exercises Other Exercises: issued pt theraputty with HEP provided Other Exercises: additionally issued pt [facility] therex handout to work on coordination tasks, pt able to return demo therex with fair carryover, suggested pt complete therex with husband  present    Shoulder Instructions       General Comments R hand with increased edema    Pertinent Vitals/ Pain       Pain Assessment Pain Assessment: No/denies pain  Home Living Family/patient expects to be discharged to:: Private residence Living Arrangements: Spouse/significant other Available Help at Discharge: Available 24 hours/day Type of Home: Other(Comment) (hotel) Home Access: Level entry (hotel)     Home Layout: One level     Bathroom Shower/Tub: Occupational psychologist: Standard Bathroom Accessibility: Yes   Home Equipment: [name] - single point   Additional Comments: [facility] - has lived there with [name]; both drink      Prior Functioning/Environment              Frequency  Min 2X/week        Progress Toward Goals  OT Goals(current goals can now be found in the care plan section)  Progress towards OT goals: Progressing toward goals  Acute Rehab OT Goals Patient Stated Goal: to go home OT Goal Formulation: With patient Time For Goal Achievement: 11/03/22 Potential to Achieve Goals: Good  Plan Discharge plan remains appropriate;Frequency remains appropriate    Co-evaluation                 AM-PAC OT "6 Clicks" Daily Activity     Outcome Measure   Help from another person eating meals?: None Help from another person taking care of personal grooming?: A Little Help from another person toileting, which includes using toliet, bedpan, or urinal?: A Little Help from another person bathing (including washing, rinsing, drying)?: A Little Help from another person to put on and taking off regular upper  body clothing?: A Little Help from another person to put on and taking off regular lower body clothing?: A Little 6 Click Score: 19    End of Session Equipment Utilized During Treatment: Other (comment) (cane)  OT Visit Diagnosis: Unsteadiness on feet (R26.81);Other abnormalities of gait and mobility (R26.89);Muscle weakness  (generalized) (M62.81);Other symptoms and signs involving the nervous system (R29.898);Other symptoms and signs involving cognitive function   Activity Tolerance Patient tolerated treatment well   Patient Left in chair;with call bell/phone within reach   Nurse Communication Mobility status        Time: 5110-2111 OT Time Calculation (min): 30 min  Charges: OT General Charges $OT Visit: 1 Visit OT Treatments $Self Care/Home Management : 8-22 mins $Therapeutic Exercise: 8-22 mins  Harley Alto., COTA/L Acute Rehabilitation Services [phone]   Precious Haws 10/21/2022, 12:12 PM

## 2022-11-24 ENCOUNTER — Ambulatory Visit: Payer: Self-pay

## 2022-11-24 ENCOUNTER — Telehealth: Payer: Self-pay

## 2022-11-24 NOTE — Telephone Encounter (Signed)
Pt  needs hospital follow up appt. Please advise. Pt called for triage today.

## 2022-11-24 NOTE — Telephone Encounter (Signed)
  Chief Complaint: Vision changes left eye - confusion Symptoms: above Frequency: today Pertinent Negatives: Patient denies  Disposition: [x] ED /[] Urgent Care (no appt availability in office) / [] Appointment(In office/virtual)/ []  [facility]/ [] Home Care/ [] Refused Recommended Disposition /[] [facility]/ []  Follow-up with PCP Additional Notes: Call from pt and husband.Pt states that she has very blurry vision in her left eye.  Husband states she has a history of seizures, and she is acting very confused.  Pt will go to ED.    Reason for Disposition  [1] Blurred vision or visual changes AND [2] present now AND [3] sudden onset or new (e.g., minutes, hours, days)  (Exception: Seeing floaters / black specks OR previously diagnosed migraine headaches with same symptoms.)  Answer Assessment - Initial Assessment Questions 1. DESCRIPTION: "How has your vision changed?" (e.g., complete vision loss, blurred vision, double vision, floaters, etc.)     Blurred vision left eye 2. LOCATION: "One or both eyes?" If one, ask: "Which eye?"     left 3. SEVERITY: "Can you see anything?" If Yes, ask: "What can you see?" (e.g., fine print)     Not  a lot 4. ONSET: "When did this begin?" "Did it start suddenly or has this been gradual?"     today 5. PATTERN: "Does this come and go, or has it been constant since it started?"      6. PAIN: "Is there any pain in your eye(s)?"  (Scale 1-10; or mild, moderate, severe)   - NONE (0): No pain.   - MILD (1-3): Doesn't interfere with normal activities.   - MODERATE (4-7): Interferes with normal activities or awakens from sleep.    - SEVERE (8-10): Excruciating pain, unable to do any normal activities.      7. CONTACTS-GLASSES: "Do you wear contacts or glasses?"      8. CAUSE: "What do you think is causing this visual problem?"      9. OTHER SYMPTOMS: "Do you have any other symptoms?" (e.g., confusion, headache, arm or leg weakness, speech  problems)     Confusion 10. PREGNANCY: "Is there any chance you are pregnant?" "When was your last menstrual period?"  Protocols used: Vision Loss or Change-A-AH

## 2022-12-17 ENCOUNTER — Other Ambulatory Visit: Payer: Self-pay

## 2022-12-17 ENCOUNTER — Emergency Department (HOSPITAL_COMMUNITY): Payer: 59

## 2022-12-17 ENCOUNTER — Inpatient Hospital Stay (HOSPITAL_COMMUNITY)
Admission: EM | Admit: 2022-12-17 | Discharge: 2022-12-21 | DRG: 101 | Disposition: A | Discharge status: Home Health | Payer: 59 | Attending: Internal Medicine | Admitting: Internal Medicine

## 2022-12-17 DIAGNOSIS — R569 Unspecified convulsions: Principal | ICD-10-CM

## 2022-12-17 DIAGNOSIS — F1010 Alcohol abuse, uncomplicated: Secondary | ICD-10-CM | POA: Diagnosis present

## 2022-12-17 DIAGNOSIS — G40909 Epilepsy, unspecified, not intractable, without status epilepticus: Secondary | ICD-10-CM | POA: Diagnosis not present

## 2022-12-17 DIAGNOSIS — G629 Polyneuropathy, unspecified: Secondary | ICD-10-CM | POA: Diagnosis present

## 2022-12-17 DIAGNOSIS — Z79899 Other long term (current) drug therapy: Secondary | ICD-10-CM

## 2022-12-17 DIAGNOSIS — Z91199 Patient's noncompliance with other medical treatment and regimen due to unspecified reason: Secondary | ICD-10-CM

## 2022-12-17 DIAGNOSIS — H269 Unspecified cataract: Secondary | ICD-10-CM | POA: Diagnosis present

## 2022-12-17 DIAGNOSIS — E876 Hypokalemia: Secondary | ICD-10-CM | POA: Diagnosis present

## 2022-12-17 DIAGNOSIS — Y900 Blood alcohol level of less than 20 mg/100 ml: Secondary | ICD-10-CM | POA: Diagnosis present

## 2022-12-17 DIAGNOSIS — D696 Thrombocytopenia, unspecified: Secondary | ICD-10-CM | POA: Diagnosis present

## 2022-12-17 LAB — CBC WITH DIFFERENTIAL/PLATELET
Abs Immature Granulocytes: 0.05 10*3/uL (ref 0.00–0.07)
Basophils Absolute: 0 10*3/uL (ref 0.0–0.1)
Basophils Relative: 1 %
Eosinophils Absolute: 0 10*3/uL (ref 0.0–0.5)
Eosinophils Relative: 0 %
HCT: 36.8 % (ref 36.0–46.0)
Hemoglobin: 12.2 g/dL (ref 12.0–15.0)
Immature Granulocytes: 1 %
Lymphocytes Relative: 16 %
Lymphs Abs: 1.4 10*3/uL (ref 0.7–4.0)
MCH: 31.9 pg (ref 26.0–34.0)
MCHC: 33.2 g/dL (ref 30.0–36.0)
MCV: 96.3 fL (ref 80.0–100.0)
Monocytes Absolute: 0.6 10*3/uL (ref 0.1–1.0)
Monocytes Relative: 7 %
Neutro Abs: 6.6 10*3/uL (ref 1.7–7.7)
Neutrophils Relative %: 75 %
Platelets: 148 10*3/uL — ABNORMAL LOW (ref 150–400)
RBC: 3.82 MIL/uL — ABNORMAL LOW (ref 3.87–5.11)
RDW: 16.1 % — ABNORMAL HIGH (ref 11.5–15.5)
WBC: 8.7 10*3/uL (ref 4.0–10.5)
nRBC: 0 % (ref 0.0–0.2)

## 2022-12-17 LAB — AMMONIA: Ammonia: 16 umol/L (ref 9–35)

## 2022-12-17 LAB — COMPREHENSIVE METABOLIC PANEL
ALT: 14 U/L (ref 0–44)
AST: 22 U/L (ref 15–41)
Albumin: 3.9 g/dL (ref 3.5–5.0)
Alkaline Phosphatase: 57 U/L (ref 38–126)
Anion gap: 11 (ref 5–15)
BUN: 5 mg/dL — ABNORMAL LOW (ref 8–23)
CO2: 19 mmol/L — ABNORMAL LOW (ref 22–32)
Calcium: 9.1 mg/dL (ref 8.9–10.3)
Chloride: 105 mmol/L (ref 98–111)
Creatinine, Ser: 0.68 mg/dL (ref 0.44–1.00)
GFR, Estimated: >60 mL/min (ref >60)
Glucose, Bld: 125 mg/dL — ABNORMAL HIGH (ref 70–99)
Potassium: 3.7 mmol/L (ref 3.5–5.1)
Sodium: 135 mmol/L (ref 135–145)
Total Bilirubin: 0.4 mg/dL (ref 0.3–1.2)
Total Protein: 6.6 g/dL (ref 6.5–8.1)

## 2022-12-17 LAB — URINALYSIS, ROUTINE W REFLEX MICROSCOPIC
Bilirubin Urine: NEGATIVE
Glucose, UA: NEGATIVE mg/dL
Hgb urine dipstick: NEGATIVE
Ketones, ur: NEGATIVE mg/dL
Leukocytes,Ua: NEGATIVE
Nitrite: NEGATIVE
Protein, ur: NEGATIVE mg/dL
Specific Gravity, Urine: 1.02 (ref 1.005–1.030)
pH: 5.5 (ref 5.0–8.0)

## 2022-12-17 LAB — SALICYLATE LEVEL: Salicylate Lvl: <7 mg/dL — ABNORMAL LOW (ref 7.0–30.0)

## 2022-12-17 LAB — ETHANOL: Alcohol, Ethyl (B): <10 mg/dL (ref <10)

## 2022-12-17 LAB — ACETAMINOPHEN LEVEL: Acetaminophen (Tylenol), Serum: <10 ug/mL — ABNORMAL LOW (ref 10–30)

## 2022-12-17 MED ORDER — LORAZEPAM 2 MG/ML IJ SOLN
1.0000 mg | Freq: Once | INTRAMUSCULAR | Status: AC
  Administered 2022-12-17: 1 mg via INTRAVENOUS
  Filled 2022-12-17: qty 1

## 2022-12-17 MED ORDER — LORAZEPAM 2 MG/ML IJ SOLN: 1.0000 mg | INTRAMUSCULAR | Status: AC | PRN

## 2022-12-17 MED ORDER — ADULT MULTIVITAMIN W/MINERALS CH
1.0000 | ORAL_TABLET | Freq: Every day | ORAL | Status: DC
  Administered 2022-12-18 – 2022-12-21 (×4): 1 via ORAL
  Filled 2022-12-17 (×4): qty 1

## 2022-12-17 MED ORDER — FOLIC ACID 1 MG PO TABS
1.0000 mg | ORAL_TABLET | Freq: Every day | ORAL | Status: DC
  Administered 2022-12-18 – 2022-12-21 (×4): 1 mg via ORAL
  Filled 2022-12-17 (×4): qty 1

## 2022-12-17 MED ORDER — HALOPERIDOL LACTATE 5 MG/ML IJ SOLN
5.0000 mg | Freq: Once | INTRAMUSCULAR | Status: AC
  Administered 2022-12-17: 5 mg via INTRAVENOUS
  Filled 2022-12-17: qty 1

## 2022-12-17 MED ORDER — LACTATED RINGERS IV BOLUS
500.0000 mL | Freq: Once | INTRAVENOUS | Status: AC
  Administered 2022-12-17: 500 mL via INTRAVENOUS

## 2022-12-17 MED ORDER — LEVETIRACETAM IN NACL 1500 MG/100ML IV SOLN
1500.0000 mg | Freq: Once | INTRAVENOUS | Status: AC
  Administered 2022-12-17: 1500 mg via INTRAVENOUS
  Filled 2022-12-17: qty 100

## 2022-12-17 MED ORDER — LORAZEPAM 1 MG PO TABS
1.0000 mg | ORAL_TABLET | ORAL | Status: AC | PRN
  Administered 2022-12-19: 1 mg via ORAL
  Filled 2022-12-17: qty 1

## 2022-12-17 MED ORDER — MIDAZOLAM HCL 2 MG/2ML IJ SOLN
INTRAMUSCULAR | Status: AC
  Filled 2022-12-17: qty 6

## 2022-12-17 MED ORDER — THIAMINE MONONITRATE 100 MG PO TABS
100.0000 mg | ORAL_TABLET | Freq: Every day | ORAL | Status: DC
  Administered 2022-12-18 – 2022-12-21 (×4): 100 mg via ORAL
  Filled 2022-12-17 (×4): qty 1

## 2022-12-17 MED ORDER — LACTATED RINGERS IV BOLUS: 1000.0000 mL | Freq: Once | INTRAVENOUS | Status: DC

## 2022-12-17 MED ORDER — THIAMINE HCL 100 MG/ML IJ SOLN: 100.0000 mg | Freq: Every day | INTRAMUSCULAR | Status: DC
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
## 2022-12-17 NOTE — ED Triage Notes (Signed)
Pt arrives to ED via EMS c/o witnessed seizure like activity on bed. Pt was postictal with blood in mouth. Pt with [place] seizure in EMS and received '5mg'$  of versed. Pt with hx of seizures. CBG 130. Possible alcohol on board hx of drug abuse.

## 2022-12-17 NOTE — Progress Notes (Signed)
Chaplain responded to Code Blue in CT Scan 2.  Upon arrival found pt stabilized and moved to Room 14.  Chaplain spoke to pt at bedside but found pt rambling in non-coherent speech. Chaplain offered silent prayer and bedside.  [place]

## 2022-12-17 NOTE — ED Notes (Signed)
Pt experienced seizure episode in CT after receiving scans. Pt was bagged by MD for several minutes until conciousness was regained. Pt now alert x 1 in room under care. MD aware

## 2022-12-18 ENCOUNTER — Encounter (HOSPITAL_COMMUNITY): Payer: Self-pay | Admitting: Internal Medicine

## 2022-12-18 ENCOUNTER — Observation Stay (HOSPITAL_COMMUNITY): Payer: 59

## 2022-12-18 DIAGNOSIS — R569 Unspecified convulsions: Principal | ICD-10-CM

## 2022-12-18 DIAGNOSIS — D696 Thrombocytopenia, unspecified: Secondary | ICD-10-CM | POA: Diagnosis present

## 2022-12-18 DIAGNOSIS — Y900 Blood alcohol level of less than 20 mg/100 ml: Secondary | ICD-10-CM | POA: Diagnosis present

## 2022-12-18 DIAGNOSIS — G40909 Epilepsy, unspecified, not intractable, without status epilepticus: Secondary | ICD-10-CM | POA: Diagnosis present

## 2022-12-18 DIAGNOSIS — F1010 Alcohol abuse, uncomplicated: Secondary | ICD-10-CM | POA: Diagnosis present

## 2022-12-18 DIAGNOSIS — G629 Polyneuropathy, unspecified: Secondary | ICD-10-CM | POA: Diagnosis present

## 2022-12-18 DIAGNOSIS — Z91199 Patient's noncompliance with other medical treatment and regimen due to unspecified reason: Secondary | ICD-10-CM | POA: Diagnosis not present

## 2022-12-18 DIAGNOSIS — H269 Unspecified cataract: Secondary | ICD-10-CM | POA: Diagnosis present

## 2022-12-18 DIAGNOSIS — G9341 Metabolic encephalopathy: Secondary | ICD-10-CM | POA: Diagnosis not present

## 2022-12-18 DIAGNOSIS — E876 Hypokalemia: Secondary | ICD-10-CM | POA: Diagnosis present

## 2022-12-18 DIAGNOSIS — Z79899 Other long term (current) drug therapy: Secondary | ICD-10-CM | POA: Diagnosis not present

## 2022-12-18 MED ORDER — LEVETIRACETAM IN NACL 500 MG/100ML IV SOLN
500.0000 mg | Freq: Two times a day (BID) | INTRAVENOUS | Status: DC
  Administered 2022-12-18 – 2022-12-19 (×3): 500 mg via INTRAVENOUS
  Filled 2022-12-18 (×3): qty 100

## 2022-12-18 MED ORDER — LACOSAMIDE 50 MG PO TABS
50.0000 mg | ORAL_TABLET | Freq: Two times a day (BID) | ORAL | Status: DC
  Administered 2022-12-18 – 2022-12-21 (×7): 50 mg via ORAL
  Filled 2022-12-18 (×7): qty 1

## 2022-12-18 MED ORDER — DEXTROSE IN LACTATED RINGERS 5 % IV SOLN: INTRAVENOUS | Status: DC

## 2022-12-18 MED ORDER — ONDANSETRON HCL 4 MG/2ML IJ SOLN: 4.0000 mg | Freq: Four times a day (QID) | INTRAMUSCULAR | Status: DC | PRN

## 2022-12-18 MED ORDER — ACETAMINOPHEN 650 MG RE SUPP: 650.0000 mg | RECTAL | Status: DC | PRN

## 2022-12-18 MED ORDER — NICOTINE 14 MG/24HR TD PT24
14.0000 mg | MEDICATED_PATCH | Freq: Every day | TRANSDERMAL | Status: DC
  Administered 2022-12-18 – 2022-12-21 (×4): 14 mg via TRANSDERMAL
  Filled 2022-12-18 (×4): qty 1

## 2022-12-18 MED ORDER — BUTALBITAL-APAP-CAFFEINE 50-325-40 MG PO TABS
1.0000 | ORAL_TABLET | Freq: Four times a day (QID) | ORAL | Status: DC | PRN
  Administered 2022-12-18 – 2022-12-19 (×4): 1 via ORAL
  Filled 2022-12-18 (×4): qty 1
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
## 2022-12-18 NOTE — Procedures (Signed)
Patient Name: Cathy Richard  MRN: [id number]  Epilepsy Attending: Lora Havens  Referring Physician/Provider: Bonnielee Haff, MD  Date: 12/18/2022  Duration: 29.38 mins  Patient history: 67 year old female who presented with seizure-like activity.  EEG [place] for seizure.  Level of alertness: Awake, asleep  AEDs during EEG study: Keppra  Technical aspects: This EEG study was done with scalp electrodes positioned according to the 10-20 International system of electrode placement. Electrical activity was reviewed with band pass filter of 1-'70Hz'$ , sensitivity of 7 uV/mm, display speed of 47m/sec with a '60Hz'$  notched filter applied as appropriate. EEG data were recorded continuously and digitally stored.  Video monitoring was available and reviewed as appropriate.  Description: The posterior dominant rhythm consists of 8 Hz activity of moderate voltage (25-35 uV) seen predominantly in posterior head regions, asymmetric ( right<left) and reactive to eye opening and eye closing. Sleep was characterized by vertex waves, sleep spindles (12 to 14 Hz), maximal frontocentral region.  EEG showed continuous rhythmic 3 to 5 Hz theta Paletta slowing admixed with sharp waves in right hemisphere, maximal right temporal region with waxing and waning morphology.  EEG also showed intermittent generalized 3 to 6 Hz theta delta slowing admixed with 15 to 18 Hz generalized beta activity.  Hyperventilation and photic stimulation were not performed.     ABNORMALITY -Lateralized rhythmic delta activity, right hemisphere, maximal right temporal region -Sharp waves, right temporal region -Intermittent slow, generalized  IMPRESSION: This study showed evidence of epileptogenicity in right temporal region. Additionally there is rhythmic sharply contoured delta activity in right hemisphere with waxing and waning morphology.  This EEG pattern is on the ictal-interictal continuum with high potential for seizures.   Please consider long-term monitoring for further characterization if clinically indicated.  Lastly there is mild to moderate diffuse encephalopathy, nonspecific etiology.  Dr. KMaryland Pinkwas notified.   Angelgabriel Willmore OBarbra Sarks

## 2022-12-18 NOTE — H&P (Signed)
History and Physical    Dessa Szucs [id number] DOB: 01/31/1952 DOA: 12/17/2022  DOS: the patient was seen and examined on 12/17/2022  PCP: Pcp, No   Patient coming from: Home  I have personally briefly reviewed patient's old medical records in [place]  CC: seizure-like activity HPI: 67 year old female with unknown medical history presents to the ER via EMS.  They were called out to patient's residence.  Reportedly bystanders noticed the patient had been drinking alcohol today.  Unknown quantity.  Patient was obtunded at the scene.  Patient was loaded into the ambulance.  She reportedly had a tonic-clonic seizure-like episode in the ambulance and was given IM Versed.  Patient underwent CT scanning.  While in the ED CT scanner, patient had another seizure-like episode requiring bag mask valve respirations.  Patient did not lose a pulse.  Patient is currently postictal.  Urine drug screen is pending.  Alcohol level was less than 10.  Ammonia level 16.  CT head demonstrated no acute intracranial findings.  CT C-spine demonstrated an anterior cervical discectomy with fusion of the C4-C7 vertebral bodies.  There is no acute fracture.  Patient still postictal.  [facility] hospital[facility] contacted for admission.  Patient loaded with 1500 mg of IV Keppra per their conversation with neurology.  Neurology asked to be consulted when the patient wakes up and is able to provide more history.    ED Course: had seizure-like episode while in CT machine. Needed BMV assisted respirations.  Review of Systems:  Review of Systems  Unable to perform ROS: Patient unresponsive    History reviewed. No pertinent past medical history.  History reviewed. No pertinent surgical history.   has no history on file for tobacco use, alcohol use, and drug use.  Not on File  Family History  Family history unknown: Yes    Prior to Admission medications   Not on File    Physical Exam: Vitals:    12/17/22 2245 12/17/22 2307 12/17/22 2315 12/18/22 0000  BP: (!) 153/94 (!) 153/100 133/85 (!) 143/87  Pulse: 100 (!) 108 83 95  Resp:      Temp:      TempSrc:      SpO2: 99%  96% 100%    Physical Exam Vitals and nursing note reviewed.  Constitutional:      Appearance: She is not toxic-appearing.     Comments: unresponsive  HENT:     Head: Normocephalic.     Nose: Nose normal.  Eyes:     Pupils: Pupils are equal, round, and reactive to light.  Cardiovascular:     Rate and Rhythm: Normal rate and regular rhythm.     Pulses: Normal pulses.  Pulmonary:     Effort: Pulmonary effort is normal.     Comments: Snoring loudly. No apnea Abdominal:     General: Bowel sounds are normal. There is no distension.  Musculoskeletal:     Right lower leg: No edema.     Left lower leg: No edema.  Skin:    General: Skin is warm and dry.  Neurological:     Comments: Unresponsive to verbal stimuli Does withdraw to pain.      Labs on Admission: I have personally reviewed following labs and imaging studies  CBC: Recent Labs  Lab 12/17/22 1931  WBC 8.7  NEUTROABS 6.6  HGB 12.2  HCT 36.8  MCV 96.3  PLT 123456*   Basic Metabolic Panel: Recent Labs  Lab 12/17/22 2102  NA 135  K  3.7  CL 105  CO2 19*  GLUCOSE 125*  BUN 5*  CREATININE 0.68  CALCIUM 9.1   GFR: CrCl cannot be calculated (Unknown ideal weight.). Liver Function Tests: Recent Labs  Lab 12/17/22 2102  AST 22  ALT 14  ALKPHOS 57  BILITOT 0.4  PROT 6.6  ALBUMIN 3.9   No results for input(s): "LIPASE", "AMYLASE" in the last 168 hours. Recent Labs  Lab 12/17/22 2102  AMMONIA 16   Coagulation Profile: No results for input(s): "INR", "PROTIME" in the last 168 hours. Cardiac Enzymes: No results for input(s): "CKTOTAL", "CKMB", "CKMBINDEX", "TROPONINI", "TROPONINIHS" in the last 168 hours. BNP (last 3 results) No results for input(s): "PROBNP" in the last 8760 hours. HbA1C: No results for input(s): "HGBA1C"  in the last 72 hours. CBG: No results for input(s): "GLUCAP" in the last 168 hours. Lipid Profile: No results for input(s): "CHOL", "HDL", "LDLCALC", "TRIG", "CHOLHDL", "LDLDIRECT" in the last 72 hours. Thyroid Function Tests: No results for input(s): "TSH", "T4TOTAL", "FREET4", "T3FREE", "THYROIDAB" in the last 72 hours. Anemia Panel: No results for input(s): "VITAMINB12", "FOLATE", "FERRITIN", "TIBC", "IRON", "RETICCTPCT" in the last 72 hours. Urine analysis:    Component Value Date/Time   COLORURINE YELLOW 12/17/2022 2053   APPEARANCEUR CLEAR 12/17/2022 2053   LABSPEC 1.020 12/17/2022 2053   PHURINE 5.5 12/17/2022 2053   GLUCOSEU NEGATIVE 12/17/2022 2053   HGBUR NEGATIVE 12/17/2022 2053   [place] NEGATIVE 12/17/2022 2053   [place] NEGATIVE 12/17/2022 2053   PROTEINUR NEGATIVE 12/17/2022 2053   NITRITE NEGATIVE 12/17/2022 2053   LEUKOCYTESUR NEGATIVE 12/17/2022 2053    Radiological Exams on Admission: I have personally reviewed images CT Cervical Spine Wo Contrast  Result Date: 12/17/2022 CLINICAL DATA:  Seizure, fall, altered mental status EXAM: CT HEAD WITHOUT CONTRAST CT CERVICAL SPINE WITHOUT CONTRAST TECHNIQUE: Multidetector CT imaging of the head and cervical spine was performed following the standard protocol without intravenous contrast. Multiplanar CT image reconstructions of the cervical spine were also generated. RADIATION DOSE REDUCTION: This exam was performed according to the departmental dose-optimization program which includes automated exposure control, adjustment of the mA and/or kV according to patient size and/or use of iterative reconstruction technique. COMPARISON:  None Available. FINDINGS: CT HEAD FINDINGS Brain: Normal anatomic configuration. Parenchymal volume loss is commensurate with the patient's age. Mild periventricular white matter changes are present likely reflecting the sequela of small vessel ischemia. No abnormal intra or extra-axial mass  lesion or fluid collection. No abnormal mass effect or midline shift. No evidence of acute intracranial hemorrhage or infarct. Ventricular size is normal. Cerebellum unremarkable. Vascular: No asymmetric hyperdense vasculature at the skull base. Skull: Intact Sinuses/Orbits: Paranasal sinuses are clear. Orbits are unremarkable. Other: Mastoid air cells and middle ear cavities are clear. CT CERVICAL SPINE FINDINGS Alignment: Normal. Skull base and vertebrae: Craniocervical alignment is normal. The atlantodental interval is not widened. Anterior cervical discectomy and fusion with instrumentation of C4-C7 has been performed. 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 No definite bridging callus identified at C4-5. No acute fracture of the cervical spine. Vertebral body height is preserved. Soft tissues and spinal canal: No prevertebral fluid or swelling. No visible canal hematoma. Disc levels: There is endplate remodeling at D34-534 and C7-T3 in keeping with changes of a moderate to severe degenerative disc disease. Prevertebral soft tissues are not thickened on sagittal reformats. Spinal canal is widely patent. Multilevel uncovertebral arthrosis results in multilevel moderate to severe neuroforaminal narrowing, most severe on the left at C4-C7 and on the  right at C5-C7. Upper chest: Negative. Other: None IMPRESSION: 1. No acute intracranial abnormality. No calvarial fracture. 2. No acute fracture or listhesis of the cervical spine. 3. Anterior cervical discectomy and fusion with instrumentation of C4-C7. No definite bridging callus identified at C4-5. 4. Multilevel degenerative disc and degenerative joint disease resulting in multilevel moderate to severe neuroforaminal narrowing, most severe on the left at C4-C7 and on the right at C5-C7. Electronically Signed   By: Fidela Salisbury M.D.   On: 12/17/2022 22:20   CT Head Wo Contrast  Result Date: 12/17/2022 CLINICAL DATA:  Seizure, fall, altered mental  status EXAM: CT HEAD WITHOUT CONTRAST CT CERVICAL SPINE WITHOUT CONTRAST TECHNIQUE: Multidetector CT imaging of the head and cervical spine was performed following the standard protocol without intravenous contrast. Multiplanar CT image reconstructions of the cervical spine were also generated. RADIATION DOSE REDUCTION: This exam was performed according to the departmental dose-optimization program which includes automated exposure control, adjustment of the mA and/or kV according to patient size and/or use of iterative reconstruction technique. COMPARISON:  None Available. FINDINGS: CT HEAD FINDINGS Brain: Normal anatomic configuration. Parenchymal volume loss is commensurate with the patient's age. Mild periventricular white matter changes are present likely reflecting the sequela of small vessel ischemia. No abnormal intra or extra-axial mass lesion or fluid collection. No abnormal mass effect or midline shift. No evidence of acute intracranial hemorrhage or infarct. Ventricular size is normal. Cerebellum unremarkable. Vascular: No asymmetric hyperdense vasculature at the skull base. Skull: Intact Sinuses/Orbits: Paranasal sinuses are clear. Orbits are unremarkable. Other: Mastoid air cells and middle ear cavities are clear. CT CERVICAL SPINE FINDINGS Alignment: Normal. Skull base and vertebrae: Craniocervical alignment is normal. The atlantodental interval is not widened. Anterior cervical discectomy and fusion with instrumentation of C4-C7 has been performed. 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 No definite bridging callus identified at C4-5. No acute fracture of the cervical spine. Vertebral body height is preserved. Soft tissues and spinal canal: No prevertebral fluid or swelling. No visible canal hematoma. Disc levels: There is endplate remodeling at D34-534 and C7-T3 in keeping with changes of a moderate to severe degenerative disc disease. Prevertebral soft tissues are not thickened on  sagittal reformats. Spinal canal is widely patent. Multilevel uncovertebral arthrosis results in multilevel moderate to severe neuroforaminal narrowing, most severe on the left at C4-C7 and on the right at C5-C7. Upper chest: Negative. Other: None IMPRESSION: 1. No acute intracranial abnormality. No calvarial fracture. 2. No acute fracture or listhesis of the cervical spine. 3. Anterior cervical discectomy and fusion with instrumentation of C4-C7. No definite bridging callus identified at C4-5. 4. Multilevel degenerative disc and degenerative joint disease resulting in multilevel moderate to severe neuroforaminal narrowing, most severe on the left at C4-C7 and on the right at C5-C7. Electronically Signed   By: Fidela Salisbury M.D.   On: 12/17/2022 22:20    EKG: My personal interpretation of EKG shows: sinus tachycardia    Assessment/Plan Principal Problem:   Seizure-like activity (HCC)    Assessment and Plan: * Seizure-like activity ([place]) Observation medical bed. Seizure precautions. Continue with IV keppra 500 mg bid. Await for neurology consult in the AM. Pt without any identification. Unsure if pt's DOB is correct. She has no encounters or records within [facility] health or Care Everywhere. Continue CIWA due to concern about alcohol use. Check CK and UDS.   DVT prophylaxis: SCDs Code Status: Full Code by default Family Communication: no family at bedside  Disposition Plan: unknown  Consults called: neurology asked to be consulted when pt wakes up  Admission status: Observation, Med-Surg   Kristopher Oppenheim, DO [facility] 12/18/2022, 12:29 AM
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
## 2022-12-18 NOTE — TOC CAGE-AID Note (Signed)
Transition of Care [facility]) - CAGE-AID Screening   Patient Details  Name: Cathy Richard MRN: [id number] Date of Birth: 01/31/1952  [facility]) CM/SW Contact:    Pollie Friar, RN Phone Number: 12/18/2022, 10:52 AM   Clinical Narrative: Pt states she quit drinking 3 weeks ago. Refused inpatient/ outpatient alcohol counseling.   CAGE-AID Screening:    Have You Ever Felt You Ought to Cut Down on Your Drinking or Drug Use?: No Have People Annoyed You By Critizing Your Drinking Or Drug Use?: No Have You Felt Bad Or Guilty About Your Drinking Or Drug Use?: No Have You Ever Had a Drink or Used Drugs First Thing In The Morning to Steady Your Nerves or to Get Rid of a Hangover?: No CAGE-AID Score: 0  Substance Abuse Education Offered: Yes (pt states she quit drinking 3 weeks ago)

## 2022-12-18 NOTE — TOC Initial Note (Signed)
Transition of Care [facility]) - Initial/Assessment Note    Patient Details  Name: Cathy Richard MRN: [id number] Date of Birth: 01/31/1952  [facility] CM/SW Contact:    Pollie Friar, RN Phone Number: 12/18/2022, 1:41 PM  Clinical Narrative:                 CM met with the patient. She lives at [facility] in room [facility]. She lives with her fiance. She states she has quit drinking alcohol about 3 weeks ago.  PCP: [facility]. CM has left them a voicemail for a follow up appointment.  Pt states she has a hard time with transportation to get to appointments. Pt has medicaid. CM has added the number for DSS to AVS so pt can call to arrange transportation for her appointments.  Pt will need all her medications filled at d/c as she states she is out of them.  Pt will need transportation home when medically ready.  Expected Discharge Plan:  (motel) Barriers to Discharge: Continued Medical Work up   Patient Goals and CMS Choice            Expected Discharge Plan and Services   Discharge Planning Services: CM Consult   Living arrangements for the past 2 months: Hotel/Motel                                      Prior Living Arrangements/Services Living arrangements for the past 2 months: Hotel/Motel Lives with:: Significant Other Patient language and need for interpreter reviewed:: Yes Do you feel safe going back to the place where you live?: Yes        Care giver support system in place?: No (comment) Current home services: DME (cane) Criminal Activity/Legal Involvement Pertinent to Current Situation/Hospitalization: No - Comment as needed  Activities of Daily Living      Permission Sought/Granted                  Emotional Assessment Appearance:: Appears stated age Attitude/Demeanor/Rapport: Engaged Affect (typically observed): Accepting Orientation: : Oriented to Self, Oriented to Place, Oriented to  Situation Alcohol / Substance Use: Alcohol Use Psych Involvement: No (comment)  Admission diagnosis:  Seizure ([place]) [R56.9] Seizure-like activity ([place]) [R56.9] Patient Active Problem List   Diagnosis Date Noted   Seizure-like activity ([place]) 12/18/2022   PCP:  Pcp, No Pharmacy:  No Pharmacies Listed    Social Determinants of Health (SDOH) Social History:   SDOH Interventions:     Readmission Risk Interventions     No data to display

## 2022-12-18 NOTE — Subjective & Objective (Signed)
CC: seizure-like activity HPI: 67 year old female with unknown medical history presents to the ER via EMS.  They were called out to patient's residence.  Reportedly bystanders noticed the patient had been drinking alcohol today.  Unknown quantity.  Patient was obtunded at the scene.  Patient was loaded into the ambulance.  She reportedly had a tonic-clonic seizure-like episode in the ambulance and was given IM Versed.  Patient underwent CT scanning.  While in the ED CT scanner, patient had another seizure-like episode requiring bag mask valve respirations.  Patient did not lose a pulse.  Patient is currently postictal.  Urine drug screen is pending.  Alcohol level was less than 10.  Ammonia level 16.  CT head demonstrated no acute intracranial findings.  CT C-spine demonstrated an anterior cervical discectomy with fusion of the C4-C7 vertebral bodies.  There is no acute fracture.  Patient still postictal.  [facility] hospitalist contacted for admission.  Patient loaded with 1500 mg of IV Keppra per their conversation with neurology.  Neurology asked to be consulted when the patient wakes up and is able to provide more history.

## 2022-12-18 NOTE — Progress Notes (Signed)
EEG complete - results pending 

## 2022-12-18 NOTE — Assessment & Plan Note (Addendum)
Observation medical bed. Seizure precautions. Continue with IV keppra 500 mg bid. Await for neurology consult in the AM. Pt without any identification. Unsure if pt's DOB is correct. She has no encounters or records within [facility] health or Care Everywhere. Continue CIWA due to concern about alcohol use. Check CK and UDS.

## 2022-12-18 NOTE — ED Notes (Signed)
ED TO INPATIENT HANDOFF REPORT  ED Nurse Name and Phone #: Bonna Gains [phone]  S Name/Age/Gender Cathy Richard 67 y.o. female Room/Bed: 015C/015C  Code Status   Code Status: Full Code  Home/SNF/Other Home Patient oriented to: self Is this baseline? No   Triage Complete: Triage complete  Chief Complaint Seizure-like activity [facility]) [R56.9]  Triage Note Pt arrives to ED via EMS c/o witnessed seizure like activity on bed. Pt was postictal with blood in mouth. Pt with [place] Mal seizure in EMS and received '5mg'$  of versed. Pt with hx of seizures. CBG 130. Possible alcohol on board hx of drug abuse.    Allergies Not on File  Level of Care/Admitting Diagnosis ED Disposition     ED Disposition  Admit   Condition  --   Comment  Hospital Area: [place] [[id number]]  Level of Care: Med-Surg [16]  May place patient in observation at [facility] or [place] if equivalent level of care is available:: No  Covid Evaluation: Asymptomatic - no recent exposure (last 10 days) testing not required  Diagnosis: [id number]  Admitting Physician: Bridgett Larsson [place]  Attending Physician: Bridgett Larsson, [place]          B Medical/Surgery History History reviewed. No pertinent past medical history. History reviewed. No pertinent surgical history.   A IV Location/Drains/Wounds Patient Lines/Drains/Airways Status     Active Line/Drains/Airways     Name Placement date Placement time Site Days   Peripheral IV 12/17/22 16 G Anterior;Right External jugular 12/17/22  1914  External jugular  1            Intake/Output Last 24 hours  Intake/Output Summary (Last 24 hours) at 12/18/2022 0043 Last data filed at 12/17/2022 2307 Gross per 24 hour  Intake 473.44 ml  Output --  Net 473.44 ml    Labs/Imaging Results for orders placed or performed during the hospital encounter of 12/17/22 (from the past 48 hour(s))  CBC with Differential      Status: Abnormal   Collection Time: 12/17/22  7:31 PM  Result Value Ref Range   WBC 8.7 4.0 - 10.5 K/uL   RBC 3.82 (L) 3.87 - 5.11 MIL/uL   Hemoglobin 12.2 12.0 - 15.0 g/dL   HCT 36.8 36.0 - 46.0 %   MCV 96.3 80.0 - 100.0 fL   MCH 31.9 26.0 - 34.0 pg   MCHC 33.2 30.0 - 36.0 g/dL   RDW 16.1 (H) 11.5 - 15.5 %   Platelets 148 (L) 150 - 400 K/uL    Comment: SPECIMEN CHECKED FOR CLOTS REPEATED TO VERIFY    nRBC 0.0 0.0 - 0.2 %   Neutrophils Relative % 75 %   Neutro Abs 6.6 1.7 - 7.7 K/uL   Lymphocytes Relative 16 %   Lymphs Abs 1.4 0.7 - 4.0 K/uL   Monocytes Relative 7 %   Monocytes Absolute 0.6 0.1 - 1.0 K/uL   Eosinophils Relative 0 %   Eosinophils Absolute 0.0 0.0 - 0.5 K/uL   Basophils Relative 1 %   Basophils Absolute 0.0 0.0 - 0.1 K/uL   Immature Granulocytes 1 %   Abs Immature Granulocytes 0.05 0.00 - 0.07 K/uL    Comment: Performed at [facility], [address], [place], [place] [address]  Ethanol     Status: None   Collection Time: 12/17/22  7:31 PM  Result Value Ref Range   Alcohol, Ethyl (B) <10 <10 mg/dL    Comment: (NOTE)  Lowest detectable limit for serum alcohol is 10 mg/dL.  For medical purposes only. Performed at [facility], [place] [address] [place], [place] [id number]   Salicylate level     Status: Abnormal   Collection Time: 12/17/22  7:31 PM  Result Value Ref Range   Salicylate Lvl [id number] (L) 7.0 - 30.0 mg/dL    Comment: Performed at [facility] [address], [place], [place] [address]  Acetaminophen level     Status: Abnormal   Collection Time: 12/17/22  7:31 PM  Result Value Ref Range   Acetaminophen (Tylenol), Serum <10 (L) 10 - 30 ug/mL    Comment: (NOTE) Therapeutic concentrations vary significantly. A range of 10-30 ug/mL  may be an effective concentration for many patients. However, some  are best treated at concentrations outside of this range. Acetaminophen concentrations >150 ug/mL at 4 hours after ingestion  and >50  ug/mL at 12 hours after ingestion are often associated with  toxic reactions.  Performed at [facility], [place] [address], [place], [place] [address]   Urinalysis, Routine w reflex microscopic -Urine, Catheterized     Status: None   Collection Time: 12/17/22  8:53 PM  Result Value Ref Range   Color, Urine YELLOW YELLOW   APPearance CLEAR CLEAR   Specific Gravity, Urine 1.020 1.005 - 1.030   pH 5.5 5.0 - 8.0   Glucose, UA NEGATIVE NEGATIVE mg/dL   Hgb urine dipstick NEGATIVE NEGATIVE   Bilirubin Urine NEGATIVE NEGATIVE   Ketones, ur NEGATIVE NEGATIVE mg/dL   Protein, ur NEGATIVE NEGATIVE mg/dL   Nitrite NEGATIVE NEGATIVE   Leukocytes,Ua NEGATIVE NEGATIVE    Comment: Microscopic not done on urines with negative protein, blood, leukocytes, nitrite, or glucose < 500 mg/dL. Performed at [facility], [place] [address] [place], [place] [address]   Ammonia     Status: None   Collection Time: 12/17/22  9:02 PM  Result Value Ref Range   Ammonia 16 9 - 35 umol/L    Comment: Performed at [facility], [place] [address], [place], [place] [address]  Comprehensive metabolic panel     Status: Abnormal   Collection Time: 12/17/22  9:02 PM  Result Value Ref Range   Sodium 135 135 - 145 mmol/L   Potassium 3.7 3.5 - 5.1 mmol/L   Chloride 105 98 - 111 mmol/L   CO2 19 (L) 22 - 32 mmol/L   Glucose, Bld 125 (H) 70 - 99 mg/dL    Comment: Glucose reference range applies only to samples taken after fasting for at least 8 hours.   BUN 5 (L) 8 - 23 mg/dL   Creatinine, Ser 0.68 0.44 - 1.00 mg/dL   Calcium 9.1 8.9 - 10.3 mg/dL   Total Protein 6.6 6.5 - 8.1 g/dL   Albumin 3.9 3.5 - 5.0 g/dL   AST 22 15 - 41 U/L   ALT 14 0 - 44 U/L   Alkaline Phosphatase 57 38 - 126 U/L   Total Bilirubin 0.4 0.3 - 1.2 mg/dL   GFR, Estimated >60 >60 mL/min    Comment: (NOTE) Calculated using the CKD-EPI Creatinine Equation (2021)    Anion gap 11 5 - 15    Comment: Performed at [facility] [address], [place], [place] [address]   CT Cervical Spine Wo Contrast  Result Date: 12/17/2022 CLINICAL DATA:  Seizure, fall, altered mental status EXAM: CT HEAD WITHOUT CONTRAST CT CERVICAL SPINE WITHOUT  CONTRAST TECHNIQUE: Multidetector CT imaging of the head and cervical spine was performed following the standard protocol without intravenous contrast. Multiplanar CT image reconstructions of the cervical spine were also generated. RADIATION DOSE REDUCTION: This exam was performed according to the departmental dose-optimization program which includes automated exposure control, adjustment of the mA and/or kV according to patient size and/or use of iterative reconstruction technique. COMPARISON:  None Available. FINDINGS: CT HEAD FINDINGS Brain: Normal anatomic configuration. Parenchymal volume loss is commensurate with the patient's age. Mild periventricular white matter changes are present likely reflecting the sequela of small vessel ischemia. No abnormal intra or extra-axial mass lesion or fluid collection. No abnormal mass effect or midline shift. No evidence of acute intracranial hemorrhage or infarct. Ventricular size is normal. Cerebellum unremarkable. Vascular: No asymmetric hyperdense vasculature at the skull base. Skull: Intact Sinuses/Orbits: Paranasal sinuses are clear. Orbits are unremarkable. Other: Mastoid air cells and middle ear cavities are clear. CT CERVICAL SPINE FINDINGS Alignment: Normal. Skull base and vertebrae: Craniocervical alignment is normal. The atlantodental interval is not widened. Anterior cervical discectomy and fusion with instrumentation of C4-C7 has been performed. 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 No definite bridging callus identified at C4-5. No acute fracture of the cervical spine. Vertebral body height is preserved. Soft tissues and spinal canal: No prevertebral fluid or swelling. No visible canal hematoma. Disc levels: There is endplate remodeling  at C2-C4 and C7-T3 in keeping with changes of a moderate to severe degenerative disc disease. Prevertebral soft tissues are not thickened on sagittal reformats. Spinal canal is widely patent. Multilevel uncovertebral arthrosis results in multilevel moderate to severe neuroforaminal narrowing, most severe on the left at C4-C7 and on the right at C5-C7. Upper chest: Negative. Other: None IMPRESSION: 1. No acute intracranial abnormality. No calvarial fracture. 2. No acute fracture or listhesis of the cervical spine. 3. Anterior cervical discectomy and fusion with instrumentation of C4-C7. No definite bridging callus identified at C4-5. 4. Multilevel degenerative disc and degenerative joint disease resulting in multilevel moderate to severe neuroforaminal narrowing, most severe on the left at C4-C7 and on the right at C5-C7. Electronically Signed   By: Fidela Salisbury M.D.   On: 12/17/2022 22:20   CT Head Wo Contrast  Result Date: 12/17/2022 CLINICAL DATA:  Seizure, fall, altered mental status EXAM: CT HEAD WITHOUT CONTRAST CT CERVICAL SPINE WITHOUT CONTRAST TECHNIQUE: Multidetector CT imaging of the head and cervical spine was performed following the standard protocol without intravenous contrast. Multiplanar CT image reconstructions of the cervical spine were also generated. RADIATION DOSE REDUCTION: This exam was performed according to the departmental dose-optimization program which includes automated exposure control, adjustment of the mA and/or kV according to patient size and/or use of iterative reconstruction technique. COMPARISON:  None Available. FINDINGS: CT HEAD FINDINGS Brain: Normal anatomic configuration. Parenchymal volume loss is commensurate with the patient's age. Mild periventricular white matter changes are present likely reflecting the sequela of small vessel ischemia. No abnormal intra or extra-axial mass lesion or fluid collection. No abnormal mass effect or midline shift. No evidence of acute  intracranial hemorrhage or infarct. Ventricular size is normal. Cerebellum unremarkable. Vascular: No asymmetric hyperdense vasculature at the skull base. Skull: Intact Sinuses/Orbits: Paranasal sinuses are clear. Orbits are unremarkable. Other: Mastoid air cells and middle ear cavities are clear. CT CERVICAL SPINE FINDINGS Alignment: Normal. Skull base and vertebrae: Craniocervical alignment is normal. The atlantodental interval is not widened. Anterior cervical discectomy and fusion with instrumentation of C4-C7 has been performed. C5-C7  demonstrates ankylosis of the vertebral bodies. No definite bridging callus identified at C4-5. No acute fracture of the cervical spine. Vertebral body height is preserved. Soft tissues and spinal canal: No prevertebral fluid or swelling. No visible canal hematoma. Disc levels: There is endplate remodeling at [id number] and C7-T3 in keeping with changes of a moderate to severe degenerative disc disease. Prevertebral soft tissues are not thickened on sagittal reformats. Spinal canal is widely patent. Multilevel uncovertebral arthrosis results in multilevel moderate to severe neuroforaminal narrowing, most severe on the left at C4-C7 and on the right at C5-C7. Upper chest: Negative. Other: None IMPRESSION: 1. No acute intracranial abnormality. No calvarial fracture. 2. No acute fracture or listhesis of the cervical spine. 3. Anterior cervical discectomy and fusion with instrumentation of C4-C7. No definite bridging callus identified at C4-5. 4. Multilevel degenerative disc and degenerative joint disease resulting in multilevel moderate to severe neuroforaminal narrowing, most severe on the left at C4-C7 and on the right at C5-C7. Electronically Signed   By: Fidela Salisbury M.D.   On: 12/17/2022 22:20    Pending Labs Unresulted Labs (From admission, onward)     Start     Ordered   12/17/22 2247  Rapid urine drug screen (hospital performed)  ONCE - STAT,   STAT        12/17/22 2246    12/17/22 2247  CK  Add-on,   AD        12/17/22 2246   Signed and Held  Comprehensive metabolic panel  Tomorrow morning,   R        Signed and Held   Signed and Held  Magnesium  Tomorrow morning,   R        Signed and Held            Vitals/Pain Today's Vitals   12/17/22 2307 12/17/22 2315 12/18/22 0000 12/18/22 0015  BP: (!) 153/100 133/85 (!) 143/87 138/87  Pulse: (!) 108 83 95 82  Resp:      Temp:    97.8 F (36.6 C)  TempSrc:    Axillary  SpO2:  96% 100% 96%  PainSc:        Isolation Precautions No active isolations  Medications Medications  midazolam (VERSED) 2 MG/2ML injection (  Not Given 12/17/22 2247)  LORazepam (ATIVAN) tablet 1-4 mg (has no administration in time range)    Or  LORazepam (ATIVAN) injection 1-4 mg (has no administration in time range)  thiamine (VITAMIN B1) tablet 100 mg (has no administration in time range)    Or  thiamine (VITAMIN B1) injection 100 mg (has no administration in time range)  folic acid (FOLVITE) tablet 1 mg (has no administration in time range)  multivitamin with minerals tablet 1 tablet (has no administration in time range)  haloperidol lactate (HALDOL) injection 5 mg (5 mg Intravenous Given 12/17/22 1955)  lactated ringers bolus 500 mL (0 mLs Intravenous Stopped 12/17/22 2054)  LORazepam (ATIVAN) injection 1 mg (1 mg Intravenous Given 12/17/22 2203)  levETIRAcetam (KEPPRA) IVPB 1500 mg/ 100 mL premix (0 mg Intravenous Stopped 12/17/22 2307)    Mobility non-ambulatory     Focused Assessments Neuro Assessment Handoff:  Swallow screen pass? No          Neuro Assessment: Exceptions to [facility] Neuro Checks:      Has TPA been given? No If patient is a Neuro Trauma and patient is going to OR before floor call report to [place] nurse: [phone] or [phone]  R Recommendations: See Admitting Provider Note  Report given to:   Additional Notes: Pt with hx of seizures, a/o x 1 pt not alert enough to walk on  own

## 2022-12-19 DIAGNOSIS — G9341 Metabolic encephalopathy: Secondary | ICD-10-CM

## 2022-12-19 LAB — MAGNESIUM: Magnesium: 2.1 mg/dL (ref 1.7–2.4)

## 2022-12-19 LAB — CBC
HCT: 37.4 % (ref 36.0–46.0)
Hemoglobin: 12.3 g/dL (ref 12.0–15.0)
MCH: 31.9 pg (ref 26.0–34.0)
MCHC: 32.9 g/dL (ref 30.0–36.0)
MCV: 96.9 fL (ref 80.0–100.0)
Platelets: 436 10*3/uL — ABNORMAL HIGH (ref 150–400)
RBC: 3.86 MIL/uL — ABNORMAL LOW (ref 3.87–5.11)
RDW: 15.9 % — ABNORMAL HIGH (ref 11.5–15.5)
WBC: 4.9 10*3/uL (ref 4.0–10.5)
nRBC: 0 % (ref 0.0–0.2)

## 2022-12-19 LAB — BASIC METABOLIC PANEL
Anion gap: 8 (ref 5–15)
BUN: <5 mg/dL — ABNORMAL LOW (ref 8–23)
CO2: 23 mmol/L (ref 22–32)
Calcium: 9.1 mg/dL (ref 8.9–10.3)
Chloride: 108 mmol/L (ref 98–111)
Creatinine, Ser: 0.6 mg/dL (ref 0.44–1.00)
GFR, Estimated: >60 mL/min (ref >60)
Glucose, Bld: 91 mg/dL (ref 70–99)
Potassium: 3.2 mmol/L — ABNORMAL LOW (ref 3.5–5.1)
Sodium: 139 mmol/L (ref 135–145)

## 2022-12-19 MED ORDER — GABAPENTIN 300 MG PO CAPS
300.0000 mg | ORAL_CAPSULE | Freq: Two times a day (BID) | ORAL | Status: DC
  Administered 2022-12-19 – 2022-12-21 (×5): 300 mg via ORAL
  Filled 2022-12-19 (×5): qty 1

## 2022-12-19 MED ORDER — POLYVINYL ALCOHOL 1.4 % OP SOLN
2.0000 [drp] | OPHTHALMIC | Status: DC | PRN
  Administered 2022-12-19: 2 [drp] via OPHTHALMIC
  Filled 2022-12-19: qty 15

## 2022-12-19 MED ORDER — LEVETIRACETAM 500 MG PO TABS
500.0000 mg | ORAL_TABLET | Freq: Two times a day (BID) | ORAL | Status: DC
  Administered 2022-12-19 – 2022-12-21 (×4): 500 mg via ORAL
  Filled 2022-12-19 (×4): qty 1

## 2022-12-19 MED ORDER — POTASSIUM CHLORIDE CRYS ER 20 MEQ PO TBCR
40.0000 meq | EXTENDED_RELEASE_TABLET | Freq: Two times a day (BID) | ORAL | Status: AC
  Administered 2022-12-19 (×2): 40 meq via ORAL
  Filled 2022-12-19 (×2): qty 2
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
## 2022-12-19 NOTE — Progress Notes (Signed)
[facility] PROGRESS NOTE   Cathy Richard [id number] DOB: 01/31/1952 DOA: 12/17/2022  PCP: Pcp, No  Brief History/Interval Summary: 67 year old female with unknown medical history presents to the ER via EMS.  They were called out to patient's residence.  Reportedly bystanders noticed the patient had been drinking alcohol. Patient was obtunded at the scene.  Patient was loaded into the ambulance.  She reportedly had a tonic-clonic seizure-like episode in the ambulance and was given IM Versed. Patient underwent CT scanning.  While in the ED CT scanner, patient had another seizure-like episode requiring bag mask valve respirations.  Patient did not lose a pulse.  Alcohol level was less than 10.  Ammonia level 16. CT head demonstrated no acute intracranial findings. CT C-spine demonstrated an anterior cervical discectomy with fusion of the C4-C7 vertebral bodies. There is no acute fracture.  Patient was hospitalized for further management. Please note patient has another medical record [id number].    Consultants: None  Procedures: EEG    Subjective/Interval History: Patient is much more awake and alert today compared to yesterday.  She does not remember how she got to the hospital.  Denies any headaches at this time.  Does complain of neuropathic pain in her feet.    Assessment/Plan:  Seizure in the setting of seizure disorder Patient with longstanding history of noncompliance.  Ran out of her seizure medications.   She was hospitalized in December-January for similar issue.  At that time she was discharged on Keppra and Vimpat.  Gabapentin was continued as well with plans to titrate the dose. She presented with seizure activity.  She was loaded with Keppra.  Vimpat was resumed.  EEG was noted to be abnormal.  Neurology consulted. Currently on 500 mg of Keppra twice a day and 50 mg of Vimpat twice a day.   No further recommendations from neurology.  Will change her to oral  Keppra today.  PT and OT evaluation.  Compliance has been emphasized with the patient.    Acute metabolic encephalopathy Secondary to postictal state.  Has improved.  No obvious focal neurological deficits.   Mild thrombocytopenia Labs are pending from today.  Concern for alcohol abuse Patient denies any recent use but apparently there are reports that she was consuming alcohol as recently as the day of admission. Patient told emphatically that she should stop drinking alcohol.  No evidence of withdrawal symptoms currently.  Continue CIWA protocol.  Continue thiamine multivitamins and folic acid.  Peripheral neuropathy Resume gabapentin.  DVT Prophylaxis: SCDs Code Status: Full code Family Communication: Discussed with patient.  No family at bedside Disposition Plan: Hopefully return home when improved  Status is: Observation The patient will require care spanning > 2 midnights and should be moved to inpatient because: Postictal state      Medications: Scheduled:  folic acid  1 mg Oral Daily   lacosamide  50 mg Oral BID   multivitamin with minerals  1 tablet Oral Daily   nicotine  14 mg Transdermal Daily   thiamine  100 mg Oral Daily   Or   thiamine  100 mg Intravenous Daily   Continuous:  dextrose 5% lactated ringers Stopped (12/18/22 0237)   levETIRAcetam 500 mg (12/19/22 0821)   [id number], butalbital-acetaminophen-caffeine, LORazepam **OR** LORazepam, ondansetron (ZOFRAN) IV  Antibiotics: Anti-infectives (From admission, onward)    None       Objective:  Vital Signs  Vitals:   12/19/22 0004 12/19/22 0343 12/19/22 0500 12/19/22 0753  BP: 110/69 130/81  137/87  Pulse: 73 74  81  Resp: '16 14  18  '$ Temp: 98.4 F (36.9 C) 98.6 F (37 C)  99.1 F (37.3 C)  TempSrc: Oral Oral  Oral  SpO2: 97% 98%  100%  Weight:   58.8 kg     Intake/Output Summary (Last 24 hours) at 12/19/2022 0919 Last data filed at 01/08/2023 2200 Gross per 24 hour  Intake 50 ml   Output 300 ml  Net -250 ml    Filed Weights   12/19/22 0500  Weight: 58.8 kg    General appearance: Awake alert.  In no distress Resp: Clear to auscultation bilaterally.  Normal effort Cardio: S1-S2 is normal regular.  No S3-S4.  No rubs murmurs or bruit GI: Abdomen is soft.  Nontender nondistended.  Bowel sounds are present normal.  No masses organomegaly Extremities: No edema.  Physical deconditioning is noted. Neurologic: Alert and oriented x3.  No focal neurological deficits.    Lab Results:  Data Reviewed: I have personally reviewed following labs and reports of the imaging studies  CBC: Recent Labs  Lab 12/17/22 1931  WBC 8.7  NEUTROABS 6.6  HGB 12.2  HCT 36.8  MCV 96.3  PLT 148*     Basic Metabolic Panel: Recent Labs  Lab 12/17/22 2102  NA 135  K 3.7  CL 105  CO2 19*  GLUCOSE 125*  BUN 5*  CREATININE 0.68  CALCIUM 9.1     GFR: CrCl cannot be calculated (Unknown ideal weight.).  Liver Function Tests: Recent Labs  Lab 12/17/22 2102  AST 22  ALT 14  ALKPHOS 57  BILITOT 0.4  PROT 6.6  ALBUMIN 3.9      Recent Labs  Lab 12/17/22 2102  AMMONIA 16     Radiology Studies: EEG adult  Result Date: 2023-01-08 Lora Havens, MD     01/08/23 12:12 PM Patient Name: Cathy Richard MRN: [id number] Epilepsy Attending: Lora Havens Referring Physician/Provider: Bonnielee Haff, MD Date: 2023/01/08 Duration: 29.38 mins Patient history: 67 year old female who presented with seizure-like activity.  EEG Drolet for seizure. Level of alertness: Awake, asleep AEDs during EEG study: Keppra Technical aspects: This EEG study was done with scalp electrodes positioned according to the 10-20 International system of electrode placement. Electrical activity was reviewed with band pass filter of 1-'70Hz'$ , sensitivity of 7 uV/mm, display speed of 31m/sec with a '60Hz'$  notched filter applied as appropriate. EEG data were recorded continuously and digitally  stored.  Video monitoring was available and reviewed as appropriate. Description: The posterior dominant rhythm consists of 8 Hz activity of moderate voltage (25-35 uV) seen predominantly in posterior head regions, asymmetric ( right<left) and reactive to eye opening and eye closing. Sleep was characterized by vertex waves, sleep spindles (12 to 14 Hz), maximal frontocentral region.  EEG showed continuous rhythmic 3 to 5 Hz theta Paletta slowing admixed with sharp waves in right hemisphere, maximal right temporal region with waxing and waning morphology.  EEG also showed intermittent generalized 3 to 6 Hz theta delta slowing admixed with 15 to 18 Hz generalized beta activity.  Hyperventilation and photic stimulation were not performed.   ABNORMALITY -Lateralized rhythmic delta activity, right hemisphere, maximal right temporal region -Sharp waves, right temporal region -Intermittent slow, generalized IMPRESSION: This study showed evidence of epileptogenicity in right temporal region. Additionally there is rhythmic sharply contoured delta activity in right hemisphere with waxing and waning morphology.  This EEG pattern is on the ictal-interictal continuum with high potential for seizures.  Please consider long-term monitoring for further characterization if clinically indicated. Lastly there is mild to moderate diffuse encephalopathy, nonspecific etiology. Dr. [place] Pink was notified. Lora Havens   CT Cervical Spine Wo Contrast  Result Date: 12/17/2022 CLINICAL DATA:  Seizure, fall, altered mental status EXAM: CT HEAD WITHOUT CONTRAST CT CERVICAL SPINE WITHOUT CONTRAST TECHNIQUE: Multidetector CT imaging of the head and cervical spine was performed following the standard protocol without intravenous contrast. Multiplanar CT image reconstructions of the cervical spine were also generated. RADIATION DOSE REDUCTION: This exam was performed according to the departmental dose-optimization program which includes  automated exposure control, adjustment of the mA and/or kV according to patient size and/or use of iterative reconstruction technique. COMPARISON:  None Available. FINDINGS: CT HEAD FINDINGS Brain: Normal anatomic configuration. Parenchymal volume loss is commensurate with the patient's age. Mild periventricular white matter changes are present likely reflecting the sequela of small vessel ischemia. No abnormal intra or extra-axial mass lesion or fluid collection. No abnormal mass effect or midline shift. No evidence of acute intracranial hemorrhage or infarct. Ventricular size is normal. Cerebellum unremarkable. Vascular: No asymmetric hyperdense vasculature at the skull base. Skull: Intact Sinuses/Orbits: Paranasal sinuses are clear. Orbits are unremarkable. Other: Mastoid air cells and middle ear cavities are clear. CT CERVICAL SPINE FINDINGS Alignment: Normal. Skull base and vertebrae: Craniocervical alignment is normal. The atlantodental interval is not widened. Anterior cervical discectomy and fusion with instrumentation of C4-C7 has been performed. 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 No definite bridging callus identified at C4-5. No acute fracture of the cervical spine. Vertebral body height is preserved. Soft tissues and spinal canal: No prevertebral fluid or swelling. No visible canal hematoma. Disc levels: There is endplate remodeling at D34-534 and C7-T3 in keeping with changes of a moderate to severe degenerative disc disease. Prevertebral soft tissues are not thickened on sagittal reformats. Spinal canal is widely patent. Multilevel uncovertebral arthrosis results in multilevel moderate to severe neuroforaminal narrowing, most severe on the left at C4-C7 and on the right at C5-C7. Upper chest: Negative. Other: None IMPRESSION: 1. No acute intracranial abnormality. No calvarial fracture. 2. No acute fracture or listhesis of the cervical spine. 3. Anterior cervical discectomy and fusion  with instrumentation of C4-C7. No definite bridging callus identified at C4-5. 4. Multilevel degenerative disc and degenerative joint disease resulting in multilevel moderate to severe neuroforaminal narrowing, most severe on the left at C4-C7 and on the right at C5-C7. Electronically Signed   By: Fidela Salisbury M.D.   On: 12/17/2022 22:20   CT Head Wo Contrast  Result Date: 12/17/2022 CLINICAL DATA:  Seizure, fall, altered mental status EXAM: CT HEAD WITHOUT CONTRAST CT CERVICAL SPINE WITHOUT CONTRAST TECHNIQUE: Multidetector CT imaging of the head and cervical spine was performed following the standard protocol without intravenous contrast. Multiplanar CT image reconstructions of the cervical spine were also generated. RADIATION DOSE REDUCTION: This exam was performed according to the departmental dose-optimization program which includes automated exposure control, adjustment of the mA and/or kV according to patient size and/or use of iterative reconstruction technique. COMPARISON:  None Available. FINDINGS: CT HEAD FINDINGS Brain: Normal anatomic configuration. Parenchymal volume loss is commensurate with the patient's age. Mild periventricular white matter changes are present likely reflecting the sequela of small vessel ischemia. No abnormal intra or extra-axial mass lesion or fluid collection. No abnormal mass effect or midline shift. No evidence of acute intracranial hemorrhage or infarct. Ventricular size is normal. Cerebellum unremarkable. Vascular: No asymmetric hyperdense vasculature at the  skull base. Skull: Intact Sinuses/Orbits: Paranasal sinuses are clear. Orbits are unremarkable. Other: Mastoid air cells and middle ear cavities are clear. CT CERVICAL SPINE FINDINGS Alignment: Normal. Skull base and vertebrae: Craniocervical alignment is normal. The atlantodental interval is not widened. Anterior cervical discectomy and fusion with instrumentation of C4-C7 has been performed. C5-C7 demonstrates  ankylosis of the vertebral bodies. No definite bridging callus identified at C4-5. No acute fracture of the cervical spine. Vertebral body height is preserved. Soft tissues and spinal canal: No prevertebral fluid or swelling. No visible canal hematoma. Disc levels: There is endplate remodeling at D34-534 and C7-T3 in keeping with changes of a moderate to severe degenerative disc disease. Prevertebral soft tissues are not thickened on sagittal reformats. Spinal canal is widely patent. Multilevel uncovertebral arthrosis results in multilevel moderate to severe neuroforaminal narrowing, most severe on the left at C4-C7 and on the right at C5-C7. Upper chest: Negative. Other: None IMPRESSION: 1. No acute intracranial abnormality. No calvarial fracture. 2. No acute fracture or listhesis of the cervical spine. 3. Anterior cervical discectomy and fusion with instrumentation of C4-C7. No definite bridging callus identified at C4-5. 4. Multilevel degenerative disc and degenerative joint disease resulting in multilevel moderate to severe neuroforaminal narrowing, most severe on the left at C4-C7 and on the right at C5-C7. Electronically Signed   By: Fidela Salisbury M.D.   On: 12/17/2022 22:20       LOS: 1 day   [facility] Pager on [email or web address]  12/19/2022, 9:19 AM

## 2022-12-20 LAB — CBC
HCT: 42 % (ref 36.0–46.0)
Hemoglobin: 13.2 g/dL (ref 12.0–15.0)
MCH: 31.4 pg (ref 26.0–34.0)
MCHC: 31.4 g/dL (ref 30.0–36.0)
MCV: 100 fL (ref 80.0–100.0)
Platelets: 476 10*3/uL — ABNORMAL HIGH (ref 150–400)
RBC: 4.2 MIL/uL (ref 3.87–5.11)
RDW: 15.9 % — ABNORMAL HIGH (ref 11.5–15.5)
WBC: 5.9 10*3/uL (ref 4.0–10.5)
nRBC: 0 % (ref 0.0–0.2)

## 2022-12-20 LAB — BASIC METABOLIC PANEL
Anion gap: 9 (ref 5–15)
BUN: 12 mg/dL (ref 8–23)
CO2: 20 mmol/L — ABNORMAL LOW (ref 22–32)
Calcium: 9.4 mg/dL (ref 8.9–10.3)
Chloride: 112 mmol/L — ABNORMAL HIGH (ref 98–111)
Creatinine, Ser: 0.87 mg/dL (ref 0.44–1.00)
GFR, Estimated: >60 mL/min (ref >60)
Glucose, Bld: 88 mg/dL (ref 70–99)
Potassium: 4.4 mmol/L (ref 3.5–5.1)
Sodium: 141 mmol/L (ref 135–145)

## 2022-12-20 MED ORDER — DIPHENHYDRAMINE HCL 50 MG/ML IJ SOLN
12.5000 mg | INTRAMUSCULAR | Status: AC
  Administered 2022-12-20: 12.5 mg via INTRAVENOUS
  Filled 2022-12-20: qty 1

## 2022-12-20 MED ORDER — OXYCODONE HCL 5 MG PO TABS
5.0000 mg | ORAL_TABLET | Freq: Once | ORAL | Status: AC
  Administered 2022-12-20: 5 mg via ORAL
  Filled 2022-12-20: qty 1

## 2022-12-20 MED ORDER — OXYCODONE HCL 5 MG PO TABS
5.0000 mg | ORAL_TABLET | ORAL | Status: DC | PRN
  Administered 2022-12-20 – 2022-12-21 (×4): 5 mg via ORAL
  Filled 2022-12-20 (×4): qty 1

## 2022-12-20 MED ORDER — POTASSIUM CHLORIDE CRYS ER 20 MEQ PO TBCR
20.0000 meq | EXTENDED_RELEASE_TABLET | Freq: Once | ORAL | Status: AC
  Administered 2022-12-20: 20 meq via ORAL
  Filled 2022-12-20: qty 1

## 2022-12-20 MED ORDER — BUTALBITAL-APAP-CAFFEINE 50-325-40 MG PO TABS
2.0000 | ORAL_TABLET | Freq: Four times a day (QID) | ORAL | Status: DC | PRN
  Administered 2022-12-20 – 2022-12-21 (×4): 2 via ORAL
  Filled 2022-12-20 (×4): qty 2

## 2022-12-20 MED ORDER — METOCLOPRAMIDE HCL 5 MG/ML IJ SOLN
5.0000 mg | INTRAMUSCULAR | Status: AC
  Administered 2022-12-20: 5 mg via INTRAVENOUS
  Filled 2022-12-20: qty 2

## 2022-12-20 MED ORDER — NAPHAZOLINE-GLYCERIN 0.012-0.25 % OP SOLN
1.0000 [drp] | Freq: Four times a day (QID) | OPHTHALMIC | Status: DC | PRN
  Administered 2022-12-20 (×2): 1 [drp] via OPHTHALMIC
  Filled 2022-12-20: qty 15

## 2022-12-20 MED ORDER — POLYETHYLENE GLYCOL 3350 17 G PO PACK
17.0000 g | PACK | Freq: Every day | ORAL | Status: DC
  Administered 2022-12-20 – 2022-12-21 (×2): 17 g via ORAL
  Filled 2022-12-20 (×2): qty 1

## 2022-12-20 MED ORDER — LORATADINE 10 MG PO TABS
10.0000 mg | ORAL_TABLET | Freq: Every day | ORAL | Status: DC
  Administered 2022-12-20 – 2022-12-21 (×2): 10 mg via ORAL
  Filled 2022-12-20 (×2): qty 1

## 2022-12-20 MED ORDER — KETOROLAC TROMETHAMINE 15 MG/ML IJ SOLN
15.0000 mg | INTRAMUSCULAR | Status: AC
  Administered 2022-12-20: 15 mg via INTRAVENOUS
  Filled 2022-12-20: qty 1

## 2022-12-20 MED ORDER — SENNOSIDES-DOCUSATE SODIUM 8.6-50 MG PO TABS
2.0000 | ORAL_TABLET | Freq: Two times a day (BID) | ORAL | Status: DC
  Administered 2022-12-20 – 2022-12-21 (×2): 2 via ORAL
  Filled 2022-12-20 (×3): qty 2

## 2022-12-20 NOTE — Progress Notes (Addendum)
[facility] PROGRESS NOTE   Cathy Richard [id number] DOB: Jun 09, 1956 DOA: 12/17/2022  PCP: Pcp, No  Brief History/Interval Summary: 67 year old female with unknown medical history presents to the ER via EMS.  They were called out to patient's residence.  Reportedly bystanders noticed the patient had been drinking alcohol. Patient was obtunded at the scene.  Patient was loaded into the ambulance.  She reportedly had a tonic-clonic seizure-like episode in the ambulance and was given IM Versed. Patient underwent CT scanning.  While in the ED CT scanner, patient had another seizure-like episode requiring bag mask valve respirations.  Patient did not lose a pulse.  Alcohol level was less than 10.  Ammonia level 16. CT head demonstrated no acute intracranial findings. CT C-spine demonstrated an anterior cervical discectomy with fusion of the C4-C7 vertebral bodies. There is no acute fracture.  Patient was hospitalized for further management. Please note patient has another medical record [id number].    Consultants: None  Procedures: EEG    Subjective/Interval History: Patient complains of blurry vision in both her eyes ongoing for several weeks.  She is supposed to follow-up with ophthalmology.  Complains of headache.  No other issues.  Feels stronger.     Assessment/Plan:  Seizure in the setting of seizure disorder Patient with longstanding history of noncompliance.  Ran out of her seizure medications.   She was hospitalized in December-January for similar issue.  At that time she was discharged on Keppra and Vimpat.  Gabapentin was continued as well with plans to titrate the dose. She presented with seizure activity.  She was loaded with Keppra.  Vimpat was resumed.  EEG was noted to be abnormal.  Neurology consulted. Currently on 500 mg of Keppra twice a day and 50 mg of Vimpat twice a day.   No further recommendations from neurology.   Seen by PT and OT.  Home health  is recommended. Compliance was emphasized to the patient.    Acute metabolic encephalopathy Secondary to postictal state.  Has improved.  No obvious focal neurological deficits.   Mild thrombocytopenia Resolved  Hypokalemia Supplemented yesterday.  Labs are pending from this morning.  Concern for alcohol abuse Patient denies any recent use but apparently there are reports that she was consuming alcohol as recently as the day of admission. Patient told emphatically that she should stop drinking alcohol.  No evidence of withdrawal symptoms currently.  Continue CIWA protocol.  Continue thiamine multivitamins and folic acid.  Peripheral neuropathy Resume gabapentin.  DVT Prophylaxis: SCDs Code Status: Full code Family Communication: Discussed with patient.  No family at bedside Disposition Plan: Hopefully return home when improved.  Anticipate discharge in the next 24 to 48 hours.  Status is: Inpatient Remains inpatient appropriate because: Seizures, acute encephalopathy    Medications: Scheduled:  folic acid  1 mg Oral Daily   gabapentin  300 mg Oral BID   lacosamide  50 mg Oral BID   levETIRAcetam  500 mg Oral BID   multivitamin with minerals  1 tablet Oral Daily   nicotine  14 mg Transdermal Daily   polyethylene glycol  17 g Oral Daily   senna-docusate  2 tablet Oral BID   thiamine  100 mg Oral Daily   Continuous:   [id number], butalbital-acetaminophen-caffeine, LORazepam **OR** LORazepam, naphazoline-glycerin, ondansetron (ZOFRAN) IV  Antibiotics: Anti-infectives (From admission, onward)    None       Objective:  Vital Signs  Vitals:   12/19/22 1949 12/20/22 0043 12/20/22 0359 12/20/22 [id number]  BP: 115/67 130/82 122/79 126/83  Pulse: 89 86 73 67  Resp: '16 16 19 20  '$ Temp: 98.3 F (36.8 C) 97.9 F (36.6 C) 98.3 F (36.8 C) 97.9 F (36.6 C)  TempSrc: Oral  Oral Oral  SpO2: 99% 97% 96% 100%  Weight:        Intake/Output Summary (Last 24 hours)  at 12/20/2022 0912 Last data filed at 12/19/2022 2011 Gross per 24 hour  Intake 50 ml  Output --  Net 50 ml    Filed Weights   12/19/22 0500  Weight: 58.8 kg    General appearance: Awake alert.  In no distress Examination of the eyes suggest cataracts in both her eyes left greater than right.  Extraocular movements are intact. Resp: Clear to auscultation bilaterally.  Normal effort Cardio: S1-S2 is normal regular.  No S3-S4.  No rubs murmurs or bruit GI: Abdomen is soft.  Nontender nondistended.  Bowel sounds are present normal.  No masses organomegaly Extremities: No edema.  Full range of motion of lower extremities. Neurologic: Alert and oriented x3.  No focal neurological deficits.     Lab Results:  Data Reviewed: I have personally reviewed following labs and reports of the imaging studies  CBC: Recent Labs  Lab 12/17/22 1931 12/19/22 1435  WBC 8.7 4.9  NEUTROABS 6.6  --   HGB 12.2 12.3  HCT 36.8 37.4  MCV 96.3 96.9  PLT 148* 436*     Basic Metabolic Panel: Recent Labs  Lab 12/17/22 2102 12/19/22 1221  NA 135 139  K 3.7 3.2*  CL 105 108  CO2 19* 23  GLUCOSE 125* 91  BUN 5* <5*  CREATININE 0.68 0.60  CALCIUM 9.1 9.1  MG  --  2.1     GFR: CrCl cannot be calculated (Unknown ideal weight.).  Liver Function Tests: Recent Labs  Lab 12/17/22 2102  AST 22  ALT 14  ALKPHOS 57  BILITOT 0.4  PROT 6.6  ALBUMIN 3.9      Recent Labs  Lab 12/17/22 2102  AMMONIA 16     Radiology Studies: EEG adult  Result Date: Dec 20, 2022 Lora Havens, MD     2022/12/20 12:12 PM Patient Name: Cathy Richard MRN: [id number] Epilepsy Attending: Lora Havens Referring Physician/Provider: Bonnielee Haff, MD Date: 12/20/22 Duration: 29.38 mins Patient history: 67 year old female who presented with seizure-like activity.  EEG Drolet for seizure. Level of alertness: Awake, asleep AEDs during EEG study: Keppra Technical aspects: This EEG study was done with  scalp electrodes positioned according to the 10-20 International system of electrode placement. Electrical activity was reviewed with band pass filter of 1-'70Hz'$ , sensitivity of 7 uV/mm, display speed of 57m/sec with a '60Hz'$  notched filter applied as appropriate. EEG data were recorded continuously and digitally stored.  Video monitoring was available and reviewed as appropriate. Description: The posterior dominant rhythm consists of 8 Hz activity of moderate voltage (25-35 uV) seen predominantly in posterior head regions, asymmetric ( right<left) and reactive to eye opening and eye closing. Sleep was characterized by vertex waves, sleep spindles (12 to 14 Hz), maximal frontocentral region.  EEG showed continuous rhythmic 3 to 5 Hz theta Paletta slowing admixed with sharp waves in right hemisphere, maximal right temporal region with waxing and waning morphology.  EEG also showed intermittent generalized 3 to 6 Hz theta delta slowing admixed with 15 to 18 Hz generalized beta activity.  Hyperventilation and photic stimulation were not performed.   ABNORMALITY -Lateralized rhythmic delta activity, right hemisphere,  maximal right temporal region -Sharp waves, right temporal region -Intermittent slow, generalized IMPRESSION: This study showed evidence of epileptogenicity in right temporal region. Additionally there is rhythmic sharply contoured delta activity in right hemisphere with waxing and waning morphology.  This EEG pattern is on the ictal-interictal continuum with high potential for seizures.  Please consider long-term monitoring for further characterization if clinically indicated. Lastly there is mild to moderate diffuse encephalopathy, nonspecific etiology. Dr. [place] Pink was notified. [place]       LOS: 2 days   Melondy Blanchard [employer] Pager on [email or web address]  12/20/2022, 9:12 AM

## 2022-12-20 NOTE — Evaluation (Signed)
Occupational Therapy Evaluation Patient Details Name: Cathy Richard MRN: [id number] DOB: 03-18-1956 Today's Date: 12/20/2022   History of Present Illness Pt is a 67 y.o. female who presented 12/17/22 with seizures. CT of head negative for acute intracranial abnormality. EEG showed evidence of epileptogenicity in right temporal region, EEG pattern is on the ictal-interictal continuum with high potential for seizures. Pt has been noncompliant with her AEDs. PMH: alcohol abuse, seizures   Clinical Impression   PTA, pt lived with her husband who assisted with home making tasks and was present during community mobility for safety. Upon eval, pt presents with impaired vision, balance, activity tolerance, coordination, safety, and pain. Pt performing UB ADL with set-up and LB ADL with min guard A. Pt observed to bump into multiple obstacles during functional mobility within her room, but experienced no difficulty with locating grooming items at sink. Pt with decreased visual acuity and unable to read any signage in room. Educated pt to follow up with ophthalmologist, and recommending HHOT to optimize safety and independence in ADL and IADL as well as education regarding safe mobility with low vision.      Recommendations for follow up therapy are one component of a multi-disciplinary discharge planning process, led by the attending physician.  Recommendations may be updated based on patient status, additional functional criteria and insurance authorization.   Follow Up Recommendations  Home health OT     Assistance Recommended at Discharge Frequent or constant Supervision/Assistance  Patient can return home with the following A little help with walking and/or transfers;A little help with bathing/dressing/bathroom;Assistance with cooking/housework;Assist for transportation;Help with stairs or ramp for entrance;Direct supervision/assist for financial management;Direct supervision/assist for  medications management    Functional Status Assessment  Patient has had a recent decline in their functional status and demonstrates the ability to make significant improvements in function in a reasonable and predictable amount of time.  Equipment Recommendations  None recommended by OT;BSC/3in1    Recommendations for Other Services       Precautions / Restrictions Precautions Precautions: Fall;Other (comment) Precaution Comments: seizures Restrictions Weight Bearing Restrictions: No      Mobility Bed Mobility Overal bed mobility: Needs Assistance Bed Mobility: Supine to Sit     Supine to sit: Supervision, HOB elevated     General bed mobility comments: Supervision for safety, HOB elevated. Incr time.    Transfers Overall transfer level: Needs assistance Equipment used: Rolling walker (2 wheels) Transfers: Sit to/from Stand Sit to Stand: Min guard           General transfer comment: Min guard assist for safety, no LOB      Balance Overall balance assessment: Needs assistance Sitting-balance support: No upper extremity supported, Feet supported Sitting balance-Leahy Scale: Good Sitting balance - Comments: Able to achieve figure 4 without difficulty   Standing balance support: Bilateral upper extremity supported, Single extremity supported, No upper extremity supported, During functional activity Standing balance-Leahy Scale: Fair Standing balance comment: Able to stand statically without UE support, reliant on UE support during dynamic mobility                           ADL either performed or assessed with clinical judgement   ADL Overall ADL's : Needs assistance/impaired Eating/Feeding: Set up;Sitting   Grooming: Wash/dry hands;Min guard;Standing Grooming Details (indicate cue type and reason): at sink Upper Body Bathing: Set up;Sitting   Lower Body Bathing: Min guard;Sit to/from stand   Upper Body Dressing :  Set up;Sitting   Lower Body  Dressing: Min guard;Sit to/from stand Lower Body Dressing Details (indicate cue type and reason): donning socks sitting in chair; reporting shoulder pain with reaching across body to reach R foot in figure 4 position. (R shoulder pain) Toilet Transfer: Min guard;Rolling walker (2 wheels);Ambulation;Cueing for safety Toilet Transfer Details (indicate cue type and reason): inconsistent use of RW. Bumping into multiple surfaces on the way, but not in the restoom, and able to locate lever to flush toilet without issues? Toileting- Clothing Manipulation and Hygiene: Modified independent;Sitting/lateral lean Toileting - Clothing Manipulation Details (indicate cue type and reason): anterior pericare     Functional mobility during ADLs: Min guard;Rolling walker (2 wheels) General ADL Comments: inconsistencies during testing, but does seem to have difficulty with vision. Pt reports everything is "blurred"     Vision Baseline Vision/History: 1 Wears glasses Ability to See in Adequate Light: 1 Impaired Patient Visual Report: Blurring of vision;Eye fatigue/eye pain/headache Vision Assessment?: Vision impaired- to be further tested in functional context Additional Comments: Pt with inconsistencies in ability during all visual testing. suspect decresaed acuity as pt unable to read anything in room or therapist's badge; unable to key in number on phone to call husband. Bumping into objects during all functional mobility within room. Perhaps some superior field deficits. pt able to locate soap and paper towels at sink, despite having bumped into sink and door of restroom.?     Perception     Praxis      Pertinent Vitals/Pain Pain Assessment Pain Assessment: Faces Faces Pain Scale: Hurts little more Pain Location: back Pain Descriptors / Indicators: Aching Pain Intervention(s): Limited activity within patient's tolerance, Monitored during session, Repositioned     Hand Dominance Right    Extremity/Trunk Assessment Upper Extremity Assessment Upper Extremity Assessment: Generalized weakness;RUE deficits/detail RUE Deficits / Details: R shoulder pain; pt reqports previous dislocation   Lower Extremity Assessment Lower Extremity Assessment: Generalized weakness   Cervical / Trunk Assessment Cervical / Trunk Assessment: Normal   Communication Communication Communication: No difficulties   Cognition Arousal/Alertness: Awake/alert Behavior During Therapy: WFL for tasks assessed/performed Overall Cognitive Status: Impaired/Different from baseline Area of Impairment: Attention, Memory, Following commands, Awareness, Problem solving                   Current Attention Level: Selective Memory: Decreased short-term memory Following Commands: Follows one step commands consistently, Follows multi-step commands inconsistently, Follows one step commands with increased time, Follows multi-step commands with increased time   Awareness: Emergent Problem Solving: Slow processing, Requires verbal cues, Requires tactile cues General Comments: Pt with decrsaed insight into current deficits, bumping into restroom door despite being able to verbalize it is there. Slowed processing and verbal/tactile cues for safety with all OOB activity     General Comments       Exercises     Shoulder Instructions      Home Living Family/patient expects to be discharged to:: Other (Comment) (motel; homestead lodge) Living Arrangements: Spouse/significant other Available Help at Discharge: Family;Available 24 hours/day Type of Home: Other(Comment) (motel) Home Access: Level entry     Home Layout: One level     Bathroom Shower/Tub: Teacher, early years/pre: Standard     Home Equipment: Cane - single point          Prior Functioning/Environment Prior Level of Function : Independent/Modified Independent             Mobility Comments: Uses SPC outside, holds onto  furniture and walls inside ADLs Comments: Per pt, independent in ADL, husband performs IADLs within the home. Husband always with pt in community setting (taking the bus, uber, etc)        OT Problem List: Decreased strength;Decreased activity tolerance;Impaired balance (sitting and/or standing);Impaired vision/perception;Decreased cognition;Decreased safety awareness;Decreased knowledge of use of DME or AE;Impaired UE functional use      OT Treatment/Interventions: Self-care/ADL training;Therapeutic exercise;DME and/or AE instruction;Balance training;Patient/family education;Cognitive remediation/compensation    OT Goals(Current goals can be found in the care plan section) Acute Rehab OT Goals Patient Stated Goal: better vision OT Goal Formulation: With patient Time For Goal Achievement: 01/03/23 Potential to Achieve Goals: Good  OT Frequency: Min 2X/week    Co-evaluation              AM-PAC OT "6 Clicks" Daily Activity     Outcome Measure Help from another person eating meals?: A Little Help from another person taking care of personal grooming?: A Little Help from another person toileting, which includes using toliet, bedpan, or urinal?: A Little Help from another person bathing (including washing, rinsing, drying)?: A Little Help from another person to put on and taking off regular upper body clothing?: A Little Help from another person to put on and taking off regular lower body clothing?: A Little 6 Click Score: 18   End of Session Equipment Utilized During Treatment: Gait belt;Rolling walker (2 wheels) Nurse Communication: Mobility status;Patient requests pain meds  Activity Tolerance: Patient tolerated treatment well Patient left: with call bell/phone within reach;in chair;with chair alarm set  OT Visit Diagnosis: Unsteadiness on feet (R26.81);Muscle weakness (generalized) (M62.81);Other abnormalities of gait and mobility (R26.89);Other symptoms and signs involving  cognitive function;Low vision, both eyes (H54.2)                Time: 0934-1000 OT Time Calculation (min): 26 min Charges:  OT General Charges $OT Visit: 1 Visit OT Evaluation $OT Eval Moderate Complexity: 1 Mod OT Treatments $Self Care/Home Management : 8-22 mins  Elder Cyphers, OTR/L [facility] Acute Rehabilitation Office: [phone]   Magnus Ivan 12/20/2022, 11:14 AM

## 2022-12-21 ENCOUNTER — Other Ambulatory Visit (HOSPITAL_COMMUNITY): Payer: Self-pay

## 2022-12-21 MED ORDER — LACOSAMIDE 50 MG PO TABS
50.0000 mg | ORAL_TABLET | Freq: Two times a day (BID) | ORAL | 1 refills | Status: DC
  Filled 2022-12-21 – 2023-02-12 (×2): qty 60, 30d supply, fill #0

## 2022-12-21 MED ORDER — GABAPENTIN 300 MG PO CAPS
300.0000 mg | ORAL_CAPSULE | Freq: Two times a day (BID) | ORAL | 1 refills | Status: DC
  Filled 2022-12-21: qty 60, 30d supply, fill #0

## 2022-12-21 MED ORDER — POLYETHYLENE GLYCOL 3350 17 GM/SCOOP PO POWD
17.0000 g | Freq: Every day | ORAL | 0 refills | Status: DC
  Filled 2022-12-21: qty 238, 14d supply, fill #0

## 2022-12-21 MED ORDER — LEVETIRACETAM 500 MG PO TABS
500.0000 mg | ORAL_TABLET | Freq: Two times a day (BID) | ORAL | 1 refills | Status: DC
  Filled 2022-12-21: qty 60, 30d supply, fill #0

## 2022-12-21 MED ORDER — FOLIC ACID 1 MG PO TABS
1.0000 mg | ORAL_TABLET | Freq: Every day | ORAL | 1 refills | Status: DC
  Filled 2022-12-21: qty 30, 30d supply, fill #0

## 2022-12-21 MED ORDER — ORAL CARE MOUTH RINSE: 15.0000 mL | OROMUCOSAL | Status: DC | PRN

## 2022-12-21 MED ORDER — OXYCODONE HCL 5 MG PO TABS
5.0000 mg | ORAL_TABLET | Freq: Four times a day (QID) | ORAL | 0 refills | Status: DC | PRN
  Filled 2022-12-21: qty 15, 4d supply, fill #0

## 2022-12-21 MED ORDER — PANTOPRAZOLE SODIUM 40 MG PO TBEC
40.0000 mg | DELAYED_RELEASE_TABLET | Freq: Every day | ORAL | 1 refills | Status: DC
  Filled 2022-12-21: qty 30, 30d supply, fill #0

## 2022-12-21 MED ORDER — THIAMINE HCL 100 MG PO TABS
100.0000 mg | ORAL_TABLET | Freq: Every day | ORAL | 1 refills | Status: DC
  Filled 2022-12-21: qty 30, 30d supply, fill #0

## 2022-12-21 MED ORDER — ALUM & MAG HYDROXIDE-SIMETH 200-200-20 MG/5ML PO SUSP
30.0000 mL | Freq: Four times a day (QID) | ORAL | Status: DC | PRN
  Administered 2022-12-21: 30 mL via ORAL
  Filled 2022-12-21: qty 30

## 2022-12-21 MED ORDER — LORATADINE 10 MG PO TABS
10.0000 mg | ORAL_TABLET | Freq: Every day | ORAL | 0 refills | Status: DC
  Filled 2022-12-21: qty 30, 30d supply, fill #0

## 2022-12-21 NOTE — TOC Transition Note (Addendum)
Transition of Care [facility]) - CM/SW Discharge Note   Patient Details  Name: Cathy Richard MRN: [id number] Date of Birth: [birth year]  [facility] CM/SW Contact:  Pollie Friar, RN Phone Number: 12/21/2022, 12:39 PM   Clinical Narrative:    Pt is discharging back to her motel. CM has gotten her a PCP follow up appt and placed information on the AVS. CM has gotten her back into Medicaid transportation and has transport arranged for her appt.  Medications for home delivered to the room per [place]. CM has provided pt with a cab voucher for transport back to the motel.  CM is unable to arrange [facility] for the patient as she has medicaid and her current living situation prohibits this being arranged. MD is aware.   Final next level of care: Home/Self Care Barriers to Discharge: No Barriers Identified   Patient Goals and CMS Choice      Discharge Placement                         Discharge Plan and Services Additional resources added to the After Visit Summary for     Discharge Planning Services: CM Consult                                 Social Determinants of Health (SDOH) Interventions     Readmission Risk Interventions     No data to display

## 2022-12-21 NOTE — Progress Notes (Signed)
CM able to obtain a pair of shoes for pt to wear home (pt did find out that her shoes were at home per her significant other) pt given her [place] meds, taken out via wheelchair with staff escort with her belongings. Taxi called and voucher to be given to the taxi driver.

## 2022-12-21 NOTE — Progress Notes (Signed)
Physical Therapy Treatment Patient Details Name: Cathy Richard MRN: [id number] DOB: Aug 28, 1956 Today's Date: 12/21/2022   History of Present Illness Pt is a 67 y.o. female who presented 12/17/22 with seizures. CT of head negative for acute intracranial abnormality. EEG showed evidence of epileptogenicity in right temporal region, EEG pattern is on the ictal-interictal continuum with high potential for seizures. Pt has been noncompliant with her AEDs. PMH: alcohol abuse, seizures    PT Comments    Pt very tangential but easily redirected. Pt functioning at min guard with SPC for ambulation and supervision for tolieting. Pt with noted impaired strength, balance, and activity tolerance however suspect this to be baseline. Pt reports "I just watch TV all day. My husband does the cooking and cleaning." Pt reports selling their car and they don't drive. Acute PT to continue to follow while in hospital however doesn't need follow up upon d/c.   Recommendations for follow up therapy are one component of a multi-disciplinary discharge planning process, led by the attending physician.  Recommendations may be updated based on patient status, additional functional criteria and insurance authorization.  Follow Up Recommendations  No PT follow up     Assistance Recommended at Discharge Frequent or constant Supervision/Assistance  Patient can return home with the following A little help with walking and/or transfers;A little help with bathing/dressing/bathroom;Assistance with cooking/housework;Direct supervision/assist for medications management;Direct supervision/assist for financial management;Assist for transportation   Equipment Recommendations  Other (comment) (shower chair)    Recommendations for Other Services       Precautions / Restrictions Precautions Precautions: Fall;Other (comment) Precaution Comments: seizures Restrictions Weight Bearing Restrictions: No     Mobility  Bed  Mobility Overal bed mobility: Needs Assistance Bed Mobility: Supine to Sit     Supine to sit: Supervision, HOB elevated     General bed mobility comments: Supervision for safety, HOB elevated. Incr time.    Transfers Overall transfer level: Needs assistance Equipment used: None Transfers: Sit to/from Stand Sit to Stand: Min guard           General transfer comment: Min guard assist for safety, no LOB    Ambulation/Gait Ambulation/Gait assistance: Min guard, Min assist Gait Distance (Feet): 250 Feet Assistive device: Straight cane Gait Pattern/deviations: Step-through pattern, Decreased stride length, Staggering left, Staggering right Gait velocity: reduced Gait velocity interpretation: <1.31 ft/sec, indicative of household ambulator   General Gait Details: pt initially unsteady reaching for counter and wall rail but s/p 20' was able to amb with SPC and min guard assist progressing to supervision. pt with good cane sequencing   Stairs             Wheelchair Mobility    Modified Rankin (Stroke Patients Only)       Balance Overall balance assessment: Needs assistance Sitting-balance support: No upper extremity supported, Feet supported Sitting balance-Leahy Scale: Good Sitting balance - Comments: Able to achieve figure 4 without difficulty   Standing balance support: Bilateral upper extremity supported, Single extremity supported, No upper extremity supported, During functional activity Standing balance-Leahy Scale: Fair Standing balance comment: Able to stand statically at sink to wash hands without assist or LOB                            Cognition Arousal/Alertness: Awake/alert Behavior During Therapy: WFL for tasks assessed/performed Overall Cognitive Status: Impaired/Different from baseline Area of Impairment: Attention, Memory, Following commands, Awareness, Problem solving  Current Attention Level:  Selective Memory: Decreased short-term memory Following Commands: Follows one step commands consistently, Follows multi-step commands inconsistently, Follows one step commands with increased time, Follows multi-step commands with increased time   Awareness: Emergent Problem Solving: Slow processing, Requires verbal cues, Requires tactile cues General Comments: pt very tangentile but easily redirected, pt reports "I am stubborn. I won't use a walker, just a cane." Pt aware she has impaired balance and decreased strength but doesn't actively participate in improving them or recommended DME        Exercises      General Comments General comments (skin integrity, edema, etc.): pt assisted bathroom, independent for hygiene      Pertinent Vitals/Pain Pain Assessment Pain Assessment: Faces Faces Pain Scale: Hurts little more Pain Location: back, R shoulder Pain Descriptors / Indicators: Aching Pain Intervention(s): RN gave pain meds during session    Home Living                          Prior Function            PT Goals (current goals can now be found in the care plan section) Acute Rehab PT Goals 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 PT Goal Formulation: With patient Time For Goal Achievement: 01/02/23 Potential to Achieve Goals: Good Progress towards PT goals: Progressing toward goals    Frequency    Min 3X/week      PT Plan Current plan remains appropriate    Co-evaluation              AM-PAC PT "6 Clicks" Mobility   Outcome Measure  Help needed turning from your back to your side while in a flat bed without using bedrails?: None Help needed moving from lying on your back to sitting on the side of a flat bed without using bedrails?: A Little Help needed moving to and from a bed to a chair (including a wheelchair)?: A Little Help needed standing up from a chair using your arms (e.g., wheelchair or bedside chair)?: A Little Help  needed to walk in hospital room?: A Little Help needed climbing 3-5 steps with a railing? : A Little 6 Click Score: 19    End of Session Equipment Utilized During Treatment: Gait belt Activity Tolerance: Patient tolerated treatment well Patient left: with call bell/phone within reach;in chair;with chair alarm set;with nursing/sitter in room Nurse Communication: Mobility status PT Visit Diagnosis: Unsteadiness on feet (R26.81);Other abnormalities of gait and mobility (R26.89);Muscle weakness (generalized) (M62.81);Difficulty in walking, not elsewhere classified (R26.2)     Time: [id number] PT Time Calculation (min) (ACUTE ONLY): 20 min  Charges:  $Gait Training: 8-22 mins                     Kittie Plater, PT, DPT Acute Rehabilitation Services Secure chat preferred Office #: [phone]    Berline Lopes 12/21/2022, 9:41 AM

## 2022-12-21 NOTE — Progress Notes (Signed)
Mobility Specialist: Progress Note   12/21/22 1116  Mobility  Activity Ambulated with assistance in hallway  Level of Assistance Minimal assist, patient does 75% or more  Assistive Device Cane  Distance Ambulated (ft) 300 ft  Activity Response Tolerated well  Mobility Referral Yes  $Mobility charge 1 Mobility   Pt received coming out of the BR with NT present in the room and agreeable to mobility. Contact guard during ambulation. LOB x1 requiring minA to correct. C/o HA, otherwise asymptomatic. Pt sitting EOB after session with call bell and phone at her side. Bed alarm is on.   [place] Rodolfo Gaster Mobility Specialist Please contact via SecureChat or Rehab office at [phone]

## 2022-12-21 NOTE — Progress Notes (Signed)
Discharge instructions reviewed/read aloud to pt, pt has visual deficits.  Copy of instructions given to pt. Scripts being filled by [facility] and will be delivered to her room prior to discharge. Pt verbalizes understanding of d/c instructions.  CM Vida Roller is working on transportation with pt's medicaid transportation to medical appts, for her PCP appt on 12/29/22, then she will set up a taxi voucher for transportation to pt's home at [employer] she lives in.

## 2022-12-21 NOTE — Progress Notes (Signed)
Pt states she had shoes on when she came in but there are none in her room with her clotes.  Pt has a pair of hospital footies on at this time. Will ask CM if there are any shoes size 6 in the social worker clothing closet that pt can wear home.

## 2022-12-21 NOTE — Discharge Summary (Addendum)
[facility]  Physician Discharge Summary   Patient ID: Cathy Richard MRN: [id number] DOB/AGE: 1955-12-22 67 y.o.  Admit date: 12/17/2022 Discharge date:   12/21/2022   PCP: Pcp, No  DISCHARGE DIAGNOSES:  Seizure disorder Acute metabolic encephalopathy, resolved Alcohol abuse, counseled Peripheral neuropathy  RECOMMENDATIONS FOR OUTPATIENT FOLLOW UP: Patient instructed to follow-up with her outpatient providers including PCP. She needs to follow-up with an ophthalmologist for visual impairment.   Home Health: Cannot get home health due to Medicaid Equipment/Devices: DME ordered  CODE STATUS: Full code  DISCHARGE CONDITION: fair  Diet recommendation: As before  INITIAL HISTORY:  67 year old female with unknown medical history presents to the ER via EMS.  They were called out to patient's residence.  Reportedly bystanders noticed the patient had been drinking alcohol. Patient was obtunded at the scene.  Patient was loaded into the ambulance.  She reportedly had a tonic-clonic seizure-like episode in the ambulance and was given IM Versed. Patient underwent CT scanning.  While in the ED CT scanner, patient had another seizure-like episode requiring bag mask valve respirations.  Patient did not lose a pulse.  Alcohol level was less than 10.  Ammonia level 16. CT head demonstrated no acute intracranial findings. CT C-spine demonstrated an anterior cervical discectomy with fusion of the C4-C7 vertebral bodies. There is no acute fracture.  Patient was hospitalized for further management. Please note patient has another medical record [id number].     Consultants: Neurology   Procedures: EEG   HOSPITAL COURSE:   Seizure in the setting of seizure disorder Patient with longstanding history of noncompliance.  Ran out of her seizure medications.   She was hospitalized in December-January for similar issue.  At that time she was discharged on Keppra and Vimpat.   Gabapentin was continued as well with plans to titrate the dose. She presented with seizure activity.  She was loaded with Keppra.  Vimpat was resumed.  EEG was noted to be abnormal.  Neurology consulted. Currently on 500 mg of Keppra twice a day and 50 mg of Vimpat twice a day.   No further recommendations from neurology.   Compliance was emphasized to the patient.     Acute metabolic encephalopathy Secondary to postictal state.  Has improved.  No obvious focal neurological deficits.    Mild thrombocytopenia Resolved   Hypokalemia Supplemented.   Concern for alcohol abuse Patient denies any recent use but apparently there are reports that she was consuming alcohol as recently as the day of admission. Patient told emphatically that she should stop drinking alcohol.  No evidence of withdrawal symptoms currently.  Continue CIWA protocol.  Continue thiamine multivitamins and folic acid.   Peripheral neuropathy Resume gabapentin.   Vision impairment/right eyelid swelling with excessive tearing No erythema noted over the right eye. EOM intact. She was given Claritin and warm compresses with improvement in swelling this morning.  She was told to follow-up with an ophthalmologist for vision impairment which has been ongoing for several months.  She likely has cataracts.  Patient is stable.  Okay for discharge home today.   PERTINENT LABS:  The results of significant diagnostics from this hospitalization (including imaging, microbiology, ancillary and laboratory) are listed below for reference.     Labs:   Basic Metabolic Panel: Recent Labs  Lab 12/17/22 2102 12/19/22 1221 12/20/22 0906  NA 135 139 141  K 3.7 3.2* 4.4  CL 105 108 112*  CO2 19* 23 20*  GLUCOSE 125* 91 88  BUN 5* <  5* 12  CREATININE 0.68 0.60 0.87  CALCIUM 9.1 9.1 9.4  MG  --  2.1  --    Liver Function Tests: Recent Labs  Lab 12/17/22 2102  AST 22  ALT 14  ALKPHOS 57  BILITOT 0.4  PROT 6.6  ALBUMIN  3.9    Recent Labs  Lab 12/17/22 2102  AMMONIA 16   CBC: Recent Labs  Lab 12/17/22 1931 12/19/22 1435 12/20/22 0906  WBC 8.7 4.9 5.9  NEUTROABS 6.6  --   --   HGB 12.2 12.3 13.2  HCT 36.8 37.4 42.0  MCV 96.3 96.9 100.0  PLT 148* 436* 476*    IMAGING STUDIES EEG adult  Result Date: 12/18/2022 Lora Havens, MD     12/18/2022 12:12 PM Patient Name: Cathy Richard MRN: [id number] Epilepsy Attending: Lora Havens Referring Physician/Provider: Bonnielee Haff, MD Date: 12/18/2022 Duration: 29.38 mins Patient history: 67 year old female who presented with seizure-like activity.  EEG Drolet for seizure. Level of alertness: Awake, asleep AEDs during EEG study: Keppra Technical aspects: This EEG study was done with scalp electrodes positioned according to the 10-20 International system of electrode placement. Electrical activity was reviewed with band pass filter of 1-'70Hz'$ , sensitivity of 7 uV/mm, display speed of 81m/sec with a '60Hz'$  notched filter applied as appropriate. EEG data were recorded continuously and digitally stored.  Video monitoring was available and reviewed as appropriate. Description: The posterior dominant rhythm consists of 8 Hz activity of moderate voltage (25-35 uV) seen predominantly in posterior head regions, asymmetric ( right<left) and reactive to eye opening and eye closing. Sleep was characterized by vertex waves, sleep spindles (12 to 14 Hz), maximal frontocentral region.  EEG showed continuous rhythmic 3 to 5 Hz theta Paletta slowing admixed with sharp waves in right hemisphere, maximal right temporal region with waxing and waning morphology.  EEG also showed intermittent generalized 3 to 6 Hz theta delta slowing admixed with 15 to 18 Hz generalized beta activity.  Hyperventilation and photic stimulation were not performed.   ABNORMALITY -Lateralized rhythmic delta activity, right hemisphere, maximal right temporal region -Sharp waves, right temporal region  -Intermittent slow, generalized IMPRESSION: This study showed evidence of epileptogenicity in right temporal region. Additionally there is rhythmic sharply contoured delta activity in right hemisphere with waxing and waning morphology.  This EEG pattern is on the ictal-interictal continuum with high potential for seizures.  Please consider long-term monitoring for further characterization if clinically indicated. Lastly there is mild to moderate diffuse encephalopathy, nonspecific etiology. Dr. KMaryland Pinkwas notified. PLora Havens  CT Cervical Spine Wo Contrast  Result Date: 12/17/2022 CLINICAL DATA:  Seizure, fall, altered mental status EXAM: CT HEAD WITHOUT CONTRAST CT CERVICAL SPINE WITHOUT CONTRAST TECHNIQUE: Multidetector CT imaging of the head and cervical spine was performed following the standard protocol without intravenous contrast. Multiplanar CT image reconstructions of the cervical spine were also generated. RADIATION DOSE REDUCTION: This exam was performed according to the departmental dose-optimization program which includes automated exposure control, adjustment of the mA and/or kV according to patient size and/or use of iterative reconstruction technique. COMPARISON:  None Available. FINDINGS: CT HEAD FINDINGS Brain: Normal anatomic configuration. Parenchymal volume loss is commensurate with the patient's age. Mild periventricular white matter changes are present likely reflecting the sequela of small vessel ischemia. No abnormal intra or extra-axial mass lesion or fluid collection. No abnormal mass effect or midline shift. No evidence of acute intracranial hemorrhage or infarct. Ventricular size is normal. Cerebellum unremarkable. Vascular: No asymmetric  hyperdense vasculature at the skull base. Skull: Intact Sinuses/Orbits: Paranasal sinuses are clear. Orbits are unremarkable. Other: Mastoid air cells and middle ear cavities are clear. CT CERVICAL SPINE FINDINGS Alignment: Normal. Skull  base and vertebrae: Craniocervical alignment is normal. The atlantodental interval is not widened. Anterior cervical discectomy and fusion with instrumentation of C4-C7 has been performed. 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 No definite bridging callus identified at C4-5. No acute fracture of the cervical spine. Vertebral body height is preserved. Soft tissues and spinal canal: No prevertebral fluid or swelling. No visible canal hematoma. Disc levels: There is endplate remodeling at D34-534 and C7-T3 in keeping with changes of a moderate to severe degenerative disc disease. Prevertebral soft tissues are not thickened on sagittal reformats. Spinal canal is widely patent. Multilevel uncovertebral arthrosis results in multilevel moderate to severe neuroforaminal narrowing, most severe on the left at C4-C7 and on the right at C5-C7. Upper chest: Negative. Other: None IMPRESSION: 1. No acute intracranial abnormality. No calvarial fracture. 2. No acute fracture or listhesis of the cervical spine. 3. Anterior cervical discectomy and fusion with instrumentation of C4-C7. No definite bridging callus identified at C4-5. 4. Multilevel degenerative disc and degenerative joint disease resulting in multilevel moderate to severe neuroforaminal narrowing, most severe on the left at C4-C7 and on the right at C5-C7. Electronically Signed   By: Fidela Salisbury M.D.   On: 12/17/2022 22:20   CT Head Wo Contrast  Result Date: 12/17/2022 CLINICAL DATA:  Seizure, fall, altered mental status EXAM: CT HEAD WITHOUT CONTRAST CT CERVICAL SPINE WITHOUT CONTRAST TECHNIQUE: Multidetector CT imaging of the head and cervical spine was performed following the standard protocol without intravenous contrast. Multiplanar CT image reconstructions of the cervical spine were also generated. RADIATION DOSE REDUCTION: This exam was performed according to the departmental dose-optimization program which includes automated exposure control,  adjustment of the mA and/or kV according to patient size and/or use of iterative reconstruction technique. COMPARISON:  None Available. FINDINGS: CT HEAD FINDINGS Brain: Normal anatomic configuration. Parenchymal volume loss is commensurate with the patient's age. Mild periventricular white matter changes are present likely reflecting the sequela of small vessel ischemia. No abnormal intra or extra-axial mass lesion or fluid collection. No abnormal mass effect or midline shift. No evidence of acute intracranial hemorrhage or infarct. Ventricular size is normal. Cerebellum unremarkable. Vascular: No asymmetric hyperdense vasculature at the skull base. Skull: Intact Sinuses/Orbits: Paranasal sinuses are clear. Orbits are unremarkable. Other: Mastoid air cells and middle ear cavities are clear. CT CERVICAL SPINE FINDINGS Alignment: Normal. Skull base and vertebrae: Craniocervical alignment is normal. The atlantodental interval is not widened. Anterior cervical discectomy and fusion with instrumentation of C4-C7 has been performed. 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 No definite bridging callus identified at C4-5. No acute fracture of the cervical spine. Vertebral body height is preserved. Soft tissues and spinal canal: No prevertebral fluid or swelling. No visible canal hematoma. Disc levels: There is endplate remodeling at D34-534 and C7-T3 in keeping with changes of a moderate to severe degenerative disc disease. Prevertebral soft tissues are not thickened on sagittal reformats. Spinal canal is widely patent. Multilevel uncovertebral arthrosis results in multilevel moderate to severe neuroforaminal narrowing, most severe on the left at C4-C7 and on the right at C5-C7. Upper chest: Negative. Other: None IMPRESSION: 1. No acute intracranial abnormality. No calvarial fracture. 2. No acute fracture or listhesis of the cervical spine. 3. Anterior cervical discectomy and fusion with instrumentation of  C4-C7. No  definite bridging callus identified at C4-5. 4. Multilevel degenerative disc and degenerative joint disease resulting in multilevel moderate to severe neuroforaminal narrowing, most severe on the left at C4-C7 and on the right at C5-C7. Electronically Signed   By: Fidela Salisbury M.D.   On: 12/17/2022 22:20    DISCHARGE EXAMINATION: Vitals:   12/20/22 2333 12/21/22 0311 12/21/22 0459 12/21/22 0729  BP: 126/82 115/75  122/84  Pulse: 69 68  75  Resp: '17 17  16  '$ Temp: 97.7 F (36.5 C) 98.3 F (36.8 C)  98.1 F (36.7 C)  TempSrc: Oral Oral  Oral  SpO2: 99% 98%  98%  Weight:   59.7 kg    General appearance: Awake alert.  In no distress Resp: Clear to auscultation bilaterally.  Normal effort Cardio: S1-S2 is normal regular.  No S3-S4.  No rubs murmurs or bruit GI: Abdomen is soft.  Nontender nondistended.  Bowel sounds are present normal.  No masses organomegaly    DISPOSITION: Home  Discharge Instructions     Call MD for:  difficulty breathing, headache or visual disturbances   Complete by: As directed    Call MD for:  extreme fatigue   Complete by: As directed    Call MD for:  persistant dizziness or light-headedness   Complete by: As directed    Call MD for:  persistant nausea and vomiting   Complete by: As directed    Call MD for:  severe uncontrolled pain   Complete by: As directed    Call MD for:  temperature >100.4   Complete by: As directed    Diet - low sodium heart healthy   Complete by: As directed    Discharge instructions   Complete by: As directed    Please be sure to follow-up with your primary care provider and with an ophthalmologist.  Your PCP can refer you to other providers.  Take your medications as prescribed.  Please do not skip doses of your seizure medicines.  Make sure you get your refills in a timely fashion.    You were cared for by a hospitalist during your hospital stay. If you have any questions about your discharge medications or the  care you received while you were in the hospital after you are discharged, you can call the unit and asked to speak with the hospitalist on call if the hospitalist that took care of you is not available. Once you are discharged, your primary care physician will handle any further medical issues. Please note that NO REFILLS for any discharge medications will be authorized once you are discharged, as it is imperative that you return to your primary care physician (or establish a relationship with a primary care physician if you do not have one) for your aftercare needs so that they can reassess your need for medications and monitor your lab values. If you do not have a primary care physician, you can call [phone] for a physician referral.   Increase activity slowly   Complete by: As directed          Allergies as of 12/21/2022   No Known Allergies      Medication List     STOP taking these medications    GOODY HEADACHE PO       TAKE these medications    acetaminophen 500 MG tablet Commonly known as: TYLENOL Take 500 mg by mouth every 6 (six) hours as needed for moderate pain.   folic acid 1 MG tablet  Commonly known as: FOLVITE Take 1 tablet (1 mg total) by mouth daily. Start taking on: December 22, 2022   gabapentin 300 MG capsule Commonly known as: NEURONTIN Take 1 capsule (300 mg total) by mouth 2 (two) times daily.   lacosamide 50 MG Tabs tablet Commonly known as: VIMPAT Take 1 tablet (50 mg total) by mouth 2 (two) times daily.   levETIRAcetam 500 MG tablet Commonly known as: KEPPRA Take 1 tablet (500 mg total) by mouth 2 (two) times daily.   loratadine 10 MG tablet Commonly known as: CLARITIN Take 1 tablet (10 mg total) by mouth daily. Start taking on: December 22, 2022   oxyCODONE 5 MG immediate release tablet Commonly known as: Oxy IR/ROXICODONE Take 1 tablet (5 mg total) by mouth every 6 (six) hours as needed for severe pain.   pantoprazole 40 MG tablet Commonly  known as: Protonix Take 1 tablet (40 mg total) by mouth daily.   polyethylene glycol powder 17 GM/SCOOP powder Commonly known as: GLYCOLAX/MIRALAX Take 1 capful (17 g) by mouth daily. Start taking on: December 22, 2022   thiamine 100 MG tablet Commonly known as: VITAMIN B1 Take 1 tablet (100 mg total) by mouth daily. Start taking on: December 22, 2022               Durable Medical Equipment  (From admission, onward)           Start     Ordered   12/21/22 [id number]  For home use only DME 3 n 1  Once        12/21/22 [id number]   12/21/22 0822  For home use only DME Shower stool  Once        12/21/22 [id number]   12/21/22 0821  For home use only DME Walker rolling  Once       Question Answer Comment  Walker: With [place] Wheels   Patient needs a walker to treat with the following condition Physical deconditioning      12/21/22 0821              Follow-up Information     [facility]. Schedule an appointment as soon as possible for a visit in 1 week(s).   Contact information: [address] [place]  [phone]         [facility] Follow up.   Why: Call 2-3 days in advance to schedule transportation. Contact information: [phone]        Danice Goltz, MD Follow up.   Specialty: Ophthalmology Why: for further evaluation of vision impairment Contact information: [place] [address] [phone]                 TOTAL DISCHARGE TIME: 58 minutes  [place]  [facility] Pager on [email or web address]  12/21/2022, 10:07 AM

## 2022-12-21 NOTE — TOC Transition Note (Signed)
Left TOC discharge meds at desk with NT.

## 2022-12-22 ENCOUNTER — Encounter (HOSPITAL_COMMUNITY): Payer: Self-pay | Admitting: Internal Medicine

## 2023-01-29 ENCOUNTER — Other Ambulatory Visit (HOSPITAL_COMMUNITY): Payer: Self-pay

## 2023-02-12 ENCOUNTER — Other Ambulatory Visit (HOSPITAL_COMMUNITY): Payer: Self-pay

## 2023-02-12 ENCOUNTER — Other Ambulatory Visit: Payer: Self-pay

## 2023-02-15 ENCOUNTER — Other Ambulatory Visit (HOSPITAL_COMMUNITY): Payer: Self-pay

## 2023-03-09 ENCOUNTER — Ambulatory Visit: Payer: 59 | Admitting: Family Medicine

## 2023-04-08 ENCOUNTER — Ambulatory Visit: Payer: 59 | Admitting: Family Medicine

## 2023-05-05 ENCOUNTER — Inpatient Hospital Stay (HOSPITAL_COMMUNITY)
Admission: EM | Admit: 2023-05-05 | Discharge: 2023-05-09 | DRG: 101 | Disposition: A | Discharge status: Home / Self Care | Payer: 59 | Attending: Internal Medicine | Admitting: Internal Medicine

## 2023-05-05 ENCOUNTER — Inpatient Hospital Stay (HOSPITAL_COMMUNITY): Payer: 59

## 2023-05-05 ENCOUNTER — Emergency Department (HOSPITAL_COMMUNITY): Payer: 59

## 2023-05-05 ENCOUNTER — Encounter (HOSPITAL_COMMUNITY): Payer: Self-pay | Admitting: Internal Medicine

## 2023-05-05 DIAGNOSIS — E876 Hypokalemia: Secondary | ICD-10-CM | POA: Diagnosis not present

## 2023-05-05 DIAGNOSIS — S01512A Laceration without foreign body of oral cavity, initial encounter: Secondary | ICD-10-CM | POA: Diagnosis present

## 2023-05-05 DIAGNOSIS — Z91148 Patient's other noncompliance with medication regimen for other reason: Secondary | ICD-10-CM

## 2023-05-05 DIAGNOSIS — Z781 Physical restraint status: Secondary | ICD-10-CM

## 2023-05-05 DIAGNOSIS — Z79899 Other long term (current) drug therapy: Secondary | ICD-10-CM | POA: Diagnosis not present

## 2023-05-05 DIAGNOSIS — F10939 Alcohol use, unspecified with withdrawal, unspecified: Secondary | ICD-10-CM | POA: Diagnosis present

## 2023-05-05 DIAGNOSIS — G9341 Metabolic encephalopathy: Secondary | ICD-10-CM | POA: Diagnosis present

## 2023-05-05 DIAGNOSIS — Z87898 Personal history of other specified conditions: Secondary | ICD-10-CM | POA: Diagnosis not present

## 2023-05-05 DIAGNOSIS — G40901 Epilepsy, unspecified, not intractable, with status epilepticus: Principal | ICD-10-CM | POA: Diagnosis present

## 2023-05-05 DIAGNOSIS — R569 Unspecified convulsions: Secondary | ICD-10-CM

## 2023-05-05 DIAGNOSIS — E872 Acidosis, unspecified: Secondary | ICD-10-CM | POA: Diagnosis present

## 2023-05-05 DIAGNOSIS — F10239 Alcohol dependence with withdrawal, unspecified: Secondary | ICD-10-CM | POA: Diagnosis present

## 2023-05-05 DIAGNOSIS — F411 Generalized anxiety disorder: Secondary | ICD-10-CM | POA: Diagnosis present

## 2023-05-05 DIAGNOSIS — F329 Major depressive disorder, single episode, unspecified: Secondary | ICD-10-CM | POA: Diagnosis present

## 2023-05-05 DIAGNOSIS — Z981 Arthrodesis status: Secondary | ICD-10-CM

## 2023-05-05 DIAGNOSIS — Z91199 Patient's noncompliance with other medical treatment and regimen due to unspecified reason: Secondary | ICD-10-CM

## 2023-05-05 DIAGNOSIS — Z9049 Acquired absence of other specified parts of digestive tract: Secondary | ICD-10-CM | POA: Diagnosis not present

## 2023-05-05 DIAGNOSIS — Z888 Allergy status to other drugs, medicaments and biological substances status: Secondary | ICD-10-CM

## 2023-05-05 DIAGNOSIS — I444 Left anterior fascicular block: Secondary | ICD-10-CM | POA: Diagnosis present

## 2023-05-05 DIAGNOSIS — Z5986 Financial insecurity: Secondary | ICD-10-CM

## 2023-05-05 DIAGNOSIS — Y900 Blood alcohol level of less than 20 mg/100 ml: Secondary | ICD-10-CM | POA: Diagnosis present

## 2023-05-05 DIAGNOSIS — G8929 Other chronic pain: Secondary | ICD-10-CM | POA: Diagnosis present

## 2023-05-05 DIAGNOSIS — Z885 Allergy status to narcotic agent status: Secondary | ICD-10-CM | POA: Diagnosis not present

## 2023-05-05 DIAGNOSIS — K861 Other chronic pancreatitis: Secondary | ICD-10-CM | POA: Diagnosis present

## 2023-05-05 DIAGNOSIS — Z90710 Acquired absence of both cervix and uterus: Secondary | ICD-10-CM | POA: Diagnosis not present

## 2023-05-05 DIAGNOSIS — F05 Delirium due to known physiological condition: Secondary | ICD-10-CM | POA: Diagnosis present

## 2023-05-05 DIAGNOSIS — Z881 Allergy status to other antibiotic agents status: Secondary | ICD-10-CM

## 2023-05-05 DIAGNOSIS — F10930 Alcohol use, unspecified with withdrawal, uncomplicated: Secondary | ICD-10-CM | POA: Diagnosis not present

## 2023-05-05 LAB — CBC WITH DIFFERENTIAL/PLATELET
Abs Immature Granulocytes: 0.03 10*3/uL (ref 0.00–0.07)
Basophils Absolute: 0 10*3/uL (ref 0.0–0.1)
Basophils Relative: 0 %
Eosinophils Absolute: 0 10*3/uL (ref 0.0–0.5)
Eosinophils Relative: 0 %
HCT: 38.1 % (ref 36.0–46.0)
Hemoglobin: 12.5 g/dL (ref 12.0–15.0)
Immature Granulocytes: 0 %
Lymphocytes Relative: 9 %
Lymphs Abs: 0.9 10*3/uL (ref 0.7–4.0)
MCH: 31.3 pg (ref 26.0–34.0)
MCHC: 32.8 g/dL (ref 30.0–36.0)
MCV: 95.5 fL (ref 80.0–100.0)
Monocytes Absolute: 0.5 10*3/uL (ref 0.1–1.0)
Monocytes Relative: 5 %
Neutro Abs: 8.2 10*3/uL — ABNORMAL HIGH (ref 1.7–7.7)
Neutrophils Relative %: 86 %
Platelets: 176 10*3/uL (ref 150–400)
RBC: 3.99 MIL/uL (ref 3.87–5.11)
RDW: 15.1 % (ref 11.5–15.5)
WBC: 9.6 10*3/uL (ref 4.0–10.5)
nRBC: 0 % (ref 0.0–0.2)

## 2023-05-05 LAB — LACTIC ACID, PLASMA: Lactic Acid, Venous: 1.3 mmol/L (ref 0.5–1.9)

## 2023-05-05 LAB — COMPREHENSIVE METABOLIC PANEL
ALT: 34 U/L (ref 0–44)
AST: 54 U/L — ABNORMAL HIGH (ref 15–41)
Albumin: 4.3 g/dL (ref 3.5–5.0)
Alkaline Phosphatase: 85 U/L (ref 38–126)
Anion gap: 22 — ABNORMAL HIGH (ref 5–15)
BUN: 6 mg/dL — ABNORMAL LOW (ref 8–23)
CO2: 12 mmol/L — ABNORMAL LOW (ref 22–32)
Calcium: 9.3 mg/dL (ref 8.9–10.3)
Chloride: 102 mmol/L (ref 98–111)
Creatinine, Ser: 0.95 mg/dL (ref 0.44–1.00)
GFR, Estimated: >60 mL/min (ref >60)
Glucose, Bld: 246 mg/dL — ABNORMAL HIGH (ref 70–99)
Potassium: 3.9 mmol/L (ref 3.5–5.1)
Sodium: 136 mmol/L (ref 135–145)
Total Bilirubin: 0.6 mg/dL (ref 0.3–1.2)
Total Protein: 7.6 g/dL (ref 6.5–8.1)

## 2023-05-05 LAB — TSH: TSH: 1.284 u[IU]/mL (ref 0.350–4.500)

## 2023-05-05 LAB — CBG MONITORING, ED: Glucose-Capillary: 244 mg/dL — ABNORMAL HIGH (ref 70–99)

## 2023-05-05 LAB — RAPID URINE DRUG SCREEN, HOSP PERFORMED
Amphetamines: NOT DETECTED
Barbiturates: NOT DETECTED
Benzodiazepines: POSITIVE — AB
Cocaine: NOT DETECTED
Opiates: NOT DETECTED
Tetrahydrocannabinol: NOT DETECTED

## 2023-05-05 LAB — ETHANOL: Alcohol, Ethyl (B): <10 mg/dL (ref <10)

## 2023-05-05 LAB — AMMONIA: Ammonia: 11 umol/L (ref 9–35)

## 2023-05-05 MED ORDER — LORAZEPAM 2 MG/ML IJ SOLN
2.0000 mg | Freq: Four times a day (QID) | INTRAMUSCULAR | Status: DC | PRN
  Administered 2023-05-05: 2 mg via INTRAVENOUS
  Filled 2023-05-05: qty 1

## 2023-05-05 MED ORDER — LORAZEPAM 2 MG/ML IJ SOLN: 2.0000 mg | Freq: Once | INTRAMUSCULAR | Status: DC

## 2023-05-05 MED ORDER — ACETAMINOPHEN 325 MG PO TABS: 650.0000 mg | ORAL_TABLET | Freq: Four times a day (QID) | ORAL | Status: DC | PRN

## 2023-05-05 MED ORDER — FOLIC ACID 5 MG/ML IJ SOLN
1.0000 mg | Freq: Every day | INTRAMUSCULAR | Status: DC
  Administered 2023-05-06 – 2023-05-08 (×4): 1 mg via INTRAVENOUS
  Filled 2023-05-05 (×6): qty 0.2

## 2023-05-05 MED ORDER — SODIUM CHLORIDE 0.9 % IV SOLN
50.0000 mg | Freq: Two times a day (BID) | INTRAVENOUS | Status: DC
  Administered 2023-05-06: 50 mg via INTRAVENOUS
  Filled 2023-05-05: qty 5

## 2023-05-05 MED ORDER — SODIUM CHLORIDE 0.9% FLUSH: 3.0000 mL | INTRAVENOUS | Status: DC | PRN

## 2023-05-05 MED ORDER — ACETAMINOPHEN 10 MG/ML IV SOLN
500.0000 mg | Freq: Once | INTRAVENOUS | Status: AC
  Administered 2023-05-05: 500 mg via INTRAVENOUS
  Filled 2023-05-05: qty 50

## 2023-05-05 MED ORDER — SODIUM CHLORIDE 0.9% FLUSH
3.0000 mL | Freq: Two times a day (BID) | INTRAVENOUS | Status: DC
  Administered 2023-05-05 – 2023-05-09 (×6): 3 mL via INTRAVENOUS

## 2023-05-05 MED ORDER — LEVETIRACETAM IN NACL 1500 MG/100ML IV SOLN
1500.0000 mg | Freq: Once | INTRAVENOUS | Status: AC
  Administered 2023-05-05: 1500 mg via INTRAVENOUS
  Filled 2023-05-05: qty 100

## 2023-05-05 MED ORDER — ONDANSETRON HCL 4 MG/2ML IJ SOLN: 4.0000 mg | Freq: Four times a day (QID) | INTRAMUSCULAR | Status: DC | PRN

## 2023-05-05 MED ORDER — THIAMINE MONONITRATE 100 MG PO TABS
100.0000 mg | ORAL_TABLET | Freq: Every day | ORAL | Status: DC
  Administered 2023-05-07 – 2023-05-09 (×3): 100 mg via ORAL
  Filled 2023-05-05 (×3): qty 1

## 2023-05-05 MED ORDER — HYDRALAZINE HCL 20 MG/ML IJ SOLN
10.0000 mg | Freq: Four times a day (QID) | INTRAMUSCULAR | Status: DC | PRN
  Administered 2023-05-06: 10 mg via INTRAVENOUS
  Filled 2023-05-05: qty 1

## 2023-05-05 MED ORDER — LACOSAMIDE 50 MG PO TABS
50.0000 mg | ORAL_TABLET | Freq: Two times a day (BID) | ORAL | Status: DC
  Administered 2023-05-06 – 2023-05-09 (×6): 50 mg via ORAL
  Filled 2023-05-05 (×6): qty 1

## 2023-05-05 MED ORDER — LORAZEPAM 2 MG/ML IJ SOLN
1.0000 mg | Freq: Once | INTRAMUSCULAR | Status: AC
  Administered 2023-05-05: 1 mg via INTRAVENOUS
  Filled 2023-05-05: qty 1

## 2023-05-05 MED ORDER — ACETAMINOPHEN 650 MG RE SUPP: 650.0000 mg | Freq: Four times a day (QID) | RECTAL | Status: DC | PRN

## 2023-05-05 MED ORDER — LEVETIRACETAM 500 MG PO TABS
500.0000 mg | ORAL_TABLET | Freq: Two times a day (BID) | ORAL | Status: DC
  Administered 2023-05-06 – 2023-05-09 (×5): 500 mg via ORAL
  Filled 2023-05-05 (×6): qty 1

## 2023-05-05 MED ORDER — SODIUM CHLORIDE 0.9 % IV SOLN: 250.0000 mL | INTRAVENOUS | Status: DC | PRN

## 2023-05-05 MED ORDER — LORAZEPAM 2 MG/ML IJ SOLN
2.0000 mg | Freq: Once | INTRAMUSCULAR | Status: AC
  Administered 2023-05-05: 2 mg via INTRAMUSCULAR
  Filled 2023-05-05: qty 1

## 2023-05-05 MED ORDER — LACTATED RINGERS IV SOLN: INTRAVENOUS | Status: AC

## 2023-05-05 MED ORDER — LEVETIRACETAM IN NACL 500 MG/100ML IV SOLN
500.0000 mg | Freq: Two times a day (BID) | INTRAVENOUS | Status: DC
  Administered 2023-05-05 – 2023-05-08 (×3): 500 mg via INTRAVENOUS
  Filled 2023-05-05 (×3): qty 100

## 2023-05-05 MED ORDER — LORAZEPAM 2 MG/ML IJ SOLN: 2.0000 mg | INTRAMUSCULAR | Status: DC | PRN

## 2023-05-05 MED ORDER — ENOXAPARIN SODIUM 40 MG/0.4ML IJ SOSY
40.0000 mg | PREFILLED_SYRINGE | INTRAMUSCULAR | Status: DC
  Administered 2023-05-05 – 2023-05-08 (×4): 40 mg via SUBCUTANEOUS
  Filled 2023-05-05 (×4): qty 0.4

## 2023-05-05 MED ORDER — ADULT MULTIVITAMIN W/MINERALS CH
1.0000 | ORAL_TABLET | Freq: Every day | ORAL | Status: DC
  Administered 2023-05-07 – 2023-05-09 (×3): 1 via ORAL
  Filled 2023-05-05 (×3): qty 1

## 2023-05-05 MED ORDER — SODIUM CHLORIDE 0.9 % IV SOLN
200.0000 mg | Freq: Once | INTRAVENOUS | Status: AC
  Administered 2023-05-05: 200 mg via INTRAVENOUS
  Filled 2023-05-05: qty 20

## 2023-05-05 MED ORDER — SODIUM CHLORIDE 0.9% FLUSH
3.0000 mL | Freq: Two times a day (BID) | INTRAVENOUS | Status: DC
  Administered 2023-05-06 – 2023-05-09 (×2): 3 mL via INTRAVENOUS

## 2023-05-05 MED ORDER — ONDANSETRON HCL 4 MG PO TABS: 4.0000 mg | ORAL_TABLET | Freq: Four times a day (QID) | ORAL | Status: DC | PRN

## 2023-05-05 MED ORDER — THIAMINE HCL 100 MG/ML IJ SOLN
100.0000 mg | Freq: Every day | INTRAMUSCULAR | Status: DC
  Administered 2023-05-05 – 2023-05-06 (×2): 100 mg via INTRAVENOUS
  Filled 2023-05-05 (×2): qty 2

## 2023-05-05 MED ORDER — LORAZEPAM 1 MG PO TABS
1.0000 mg | ORAL_TABLET | ORAL | Status: AC | PRN
  Administered 2023-05-06 – 2023-05-07 (×3): 1 mg via ORAL
  Filled 2023-05-05 (×3): qty 1

## 2023-05-05 MED ORDER — LORAZEPAM 2 MG/ML IJ SOLN: 1.0000 mg | INTRAMUSCULAR | Status: AC | PRN

## 2023-05-05 NOTE — H&P (Signed)
History and Physical    Kamorie Aldous ZOX:[id number] DOB: May 20, 1956 DOA: 05/05/2023  PCP: Diamantina Providence, FNP   Patient coming from: Home   Chief Complaint:  Chief Complaint  Patient presents with   Seizures    HPI:  67 y.o. female with a past medical history significant for previous subarachnoid hemorrhage, alcohol withdrawal seizures, previous status epilepticus, depression, anxiety, diverticulosis, previous cholecystectomy, hysterectomy, and chronic pancreatitis who presents for seizures.  According to EMS, patient had approximately 8 seizures since early this morning per family and had a seizure with EMS.  Over the last few hours patient has not returned back to her baseline.  Patient is alert but not oriented to self.  EMS reports they did find a bottle with seizure medication at scene but family had mentioned that patient had not been taking her seizure medicine because she was out of it.  Family is not yet here, unclear if there was any changes to alcohol intake.  No reported trauma.  Glucose was over 200 with EMS showed no hypoglycemia.  No other changes reported.  EMS gave 5 IM Versed to treat seizure that they saw.   During my evaluation, Patient has some dried blood on mouth and has a small abrasion to the tip of her tongue.  No large laceration seen initially.  Patient is able to open her eyes and unable to follow any command.   Chart review shows that patient was admitted for multiple seizures several months ago.  It appears that patient is on Keppra and Vimpat at home.  Neurology has been evaluated patient in the emergency department recommended EEG monitoring, checking Keppra and Vimpat level, Keppra load 1500 mg IV once and continue home Keppra 500 mg twice daily and Vimpat 50 mg twice daily, Ativan as needed for seizure and observe overnight for seizure clustering.   ED Course:  At ED on initial presentation heart rate 98, respiratory 27 blood pressure  155/99. CBC grossly unremarkable. Same showed sodium 136, potassium 3.9, chloride 102, bicarb 12, blood glucose 246, BUN 6, creatinine 0.95 and an 1 22. Pending UDS Blood alcohol level less than 10.  TSH within normal range. Head CT no acute intracranial abnormality and mild chronic atrophy.  Hospitalist team has been consulted to admit patient for management for seizure and possible alcohol withdrawal related seizure.  Review of Systems:  Review of Systems  Unable to perform ROS: Mental status change    Past Medical History:  Diagnosis Date   Alcohol dependence (HCC)    Anxiety    Chronic pain    Depression    Diverticulitis    Herniated cervical disc    Pancreatitis    Seizures (HCC)    alcoholic seizures    Past Surgical History:  Procedure Laterality Date   ABDOMINAL HYSTERECTOMY     ANKLE SURGERY Right    CARPAL TUNNEL RELEASE Bilateral    CERVICAL FUSION     CHOLECYSTECTOMY     KNEE SURGERY       reports that she has an unknown smoking status. She has never used smokeless tobacco. She reports current alcohol use. She reports that she does not currently use drugs after having used the following drugs: Marijuana.  Allergies  Allergen Reactions   Keflex [Cephalexin] Hives   Cephalosporins Hives   Prednisone Swelling   Toradol [Ketorolac Tromethamine] Hives   Ultram [Tramadol] Hives    Family History  Family history unknown: Yes    Prior to Admission  medications   Medication Sig Start Date End Date Taking? Authorizing Provider  acetaminophen (TYLENOL) 325 MG tablet Take 2 tablets (650 mg total) by mouth every 4 (four) hours as needed for fever, mild pain, moderate pain or headache. 05/23/22   Tyrone Nine, MD  acetaminophen (TYLENOL) 500 MG tablet Take 500 mg by mouth every 6 (six) hours as needed for moderate pain.    [provider]  folic acid (FOLVITE) 1 MG tablet Take 1 mg by mouth daily.    [provider]  folic acid (FOLVITE) 1 MG  tablet Take 1 tablet (1 mg total) by mouth daily. 12/22/22   Osvaldo Shipper, MD  gabapentin (NEURONTIN) 300 MG capsule Take 1 capsule (300 mg total) by mouth 2 (two) times daily. 10/21/22   Osvaldo Shipper, MD  gabapentin (NEURONTIN) 300 MG capsule Take 1 capsule (300 mg total) by mouth 2 (two) times daily. 12/21/22   Osvaldo Shipper, MD  ibuprofen (ADVIL) 200 MG tablet Take 200 mg by mouth every 6 (six) hours as needed for fever, headache or mild pain.    [provider]  lacosamide (VIMPAT) 200 MG TABS tablet Take 1 tablet (200 mg total) by mouth 2 (two) times daily. 10/21/22   Osvaldo Shipper, MD  lacosamide (VIMPAT) 50 MG TABS tablet Take 1 tablet (50 mg total) by mouth 2 (two) times daily. 12/21/22   Osvaldo Shipper, MD  levETIRAcetam (KEPPRA) 500 MG tablet Take 1 tablet (500 mg total) by mouth 2 (two) times daily. 12/21/22   Osvaldo Shipper, MD  levETIRAcetam (KEPPRA) 750 MG tablet Take 2 tablets (1,500 mg total) by mouth 2 (two) times daily. 10/21/22   Osvaldo Shipper, MD  loratadine (CLARITIN) 10 MG tablet Take 1 tablet (10 mg total) by mouth daily. 12/22/22   Osvaldo Shipper, MD  naltrexone (DEPADE) 50 MG tablet Take 50 mg by mouth daily.    [provider]  oxyCODONE (OXY IR/ROXICODONE) 5 MG immediate release tablet Take 1 tablet (5 mg total) by mouth every 6 (six) hours as needed for severe pain. 12/21/22   Osvaldo Shipper, MD  pantoprazole (PROTONIX) 40 MG tablet Take 1 tablet (40 mg total) by mouth daily. 12/21/22 02/19/23  Osvaldo Shipper, MD  polyethylene glycol powder (GLYCOLAX/MIRALAX) 17 GM/SCOOP powder Take 1 capful (17 g) by mouth daily. 12/22/22   Osvaldo Shipper, MD  thiamine (VITAMIN B1) 100 MG tablet Take 1 tablet (100 mg total) by mouth daily. 05/24/22   Tyrone Nine, MD  thiamine (VITAMIN B1) 100 MG tablet Take 1 tablet (100 mg total) by mouth daily. 12/22/22   Osvaldo Shipper, MD  DULoxetine (CYMBALTA) 30 MG capsule Take 1 capsule (30 mg total) by mouth daily. Patient not taking:  Reported on 09/01/2020 06/11/20 09/02/20  Money, [name], FNP  metoCLOPramide (REGLAN) 10 MG tablet Take 1 tablet (10 mg total) by mouth every 6 (six) hours as needed for nausea (nausea/headache). 04/06/19 05/24/19  Mesner, Barbara Cower, MD     Physical Exam: Vitals:   05/05/23 1351 05/05/23 1615 05/05/23 1945 05/05/23 1946  BP:  (!) 148/87 (!) 140/87   Pulse: 83 94 86   Resp: 17 (!) 21 (!) 23   Temp:    98.2 F (36.8 C)  TempSrc:    Axillary  SpO2: 98% 96% 100%     Physical Exam HENT:     Head: Normocephalic and atraumatic.     Nose: Nose normal.     Mouth/Throat:     Mouth: Mucous membranes  are moist.  Eyes:     Pupils: Pupils are equal, round, and reactive to light.  Cardiovascular:     Rate and Rhythm: Normal rate and regular rhythm.     Pulses: Normal pulses.     Heart sounds: Normal heart sounds.  Pulmonary:     Effort: Pulmonary effort is normal.     Breath sounds: Normal breath sounds.  Abdominal:     General: Bowel sounds are normal.  Musculoskeletal:        General: No swelling.     Cervical back: Neck supple.  Skin:    General: Skin is warm.     Capillary Refill: Capillary refill takes less than 2 seconds.  Neurological:     Mental Status: She is alert.     Comments: Patient is alert but not oriented to self and surroundings.  Unable to follow command  Psychiatric:     Comments: Unable to assess      Labs on Admission: I have personally reviewed following labs and imaging studies  CBC: Recent Labs  Lab 05/05/23 1855  WBC 9.6  NEUTROABS 8.2*  HGB 12.5  HCT 38.1  MCV 95.5  PLT 176   Basic Metabolic Panel: Recent Labs  Lab 05/05/23 1312  NA 136  K 3.9  CL 102  CO2 12*  GLUCOSE 246*  BUN 6*  CREATININE 0.95  CALCIUM 9.3   GFR: CrCl cannot be calculated (Unknown ideal weight.). Liver Function Tests: Recent Labs  Lab 05/05/23 1312  AST 54*  ALT 34  ALKPHOS 85  BILITOT 0.6  PROT 7.6  ALBUMIN 4.3   No results for input(s): "LIPASE",  "AMYLASE" in the last 168 hours. No results for input(s): "AMMONIA" in the last 168 hours. Coagulation Profile: No results for input(s): "INR", "PROTIME" in the last 168 hours. Cardiac Enzymes: No results for input(s): "CKTOTAL", "CKMB", "CKMBINDEX", "TROPONINI", "TROPONINIHS" in the last 168 hours. BNP (last 3 results) No results for input(s): "BNP" in the last 8760 hours. HbA1C: No results for input(s): "HGBA1C" in the last 72 hours. CBG: Recent Labs  Lab 05/05/23 1234  GLUCAP 244*   Lipid Profile: No results for input(s): "CHOL", "HDL", "LDLCALC", "TRIG", "CHOLHDL", "LDLDIRECT" in the last 72 hours. Thyroid Function Tests: Recent Labs    05/05/23 1312  TSH 1.284   Anemia Panel: No results for input(s): "VITAMINB12", "FOLATE", "FERRITIN", "TIBC", "IRON", "RETICCTPCT" in the last 72 hours. Urine analysis:    Component Value Date/Time   COLORURINE YELLOW 12/17/2022 2053   APPEARANCEUR CLEAR 12/17/2022 2053   LABSPEC 1.020 12/17/2022 2053   PHURINE 5.5 12/17/2022 2053   GLUCOSEU NEGATIVE 12/17/2022 2053   HGBUR NEGATIVE 12/17/2022 2053   BILIRUBINUR NEGATIVE 12/17/2022 2053   KETONESUR NEGATIVE 12/17/2022 2053   PROTEINUR NEGATIVE 12/17/2022 2053   UROBILINOGEN 0.2 07/14/2019 1451   NITRITE NEGATIVE 12/17/2022 2053   LEUKOCYTESUR NEGATIVE 12/17/2022 2053    Radiological Exams on Admission: I have personally reviewed images CT HEAD WO CONTRAST ( )  Result Date: 05/05/2023 CLINICAL DATA:  Mental status change, persistent or worsening. Seizures. EXAM: CT HEAD WITHOUT CONTRAST TECHNIQUE: Contiguous axial images were obtained from the base of the skull through the vertex without intravenous contrast. RADIATION DOSE REDUCTION: This exam was performed according to the departmental dose-optimization program which includes automated exposure control, adjustment of the mA and/or kV according to patient size and/or use of iterative reconstruction technique. COMPARISON:  10/17/2022  FINDINGS: Brain: No evidence of acute infarction, hemorrhage, hydrocephalus, extra-axial collection or mass  lesion/mass effect. Mild diffuse cerebral atrophy. Vascular: No hyperdense vessel or unexpected calcification. Skull: Normal. Negative for fracture or focal lesion. Sinuses/Orbits: No acute finding. Other: None. IMPRESSION: No acute intracranial abnormalities.  Mild chronic atrophy. Electronically Signed   By: Burman Nieves M.D.   On: 05/05/2023 16:41    EKG: My personal interpretation of EKG shows: Sinus tachycardia.  No ST and T wave abnormality.   Assessment/Plan: Principal Problem:   Status epilepticus (HCC) Active Problems:   Acute metabolic encephalopathy   Seizure-like activity (HCC)   Alcohol withdrawal related seizure (HCC)   Major depressive disorder   History of seizure   GAD (generalized anxiety disorder)    Assessment and Plan: Status epilepticus Seizure-like activity History of seizure Seizure likely related to medication noncompliance versus alcohol withdrawal Acute metabolic encephalopathy secondary from seizure (patient is alert but not oriented) -Vital signs stable on presentation and patient maintaining airway. - Head CT negative for any acute intracranial abnormality. -Patient has witnessed 8-9 episodes of seizure reported by EMS. In the emergency department patient got Ativan 2 mg x 2 to calm patient down for head CT.  No evidence of seizure in the ED since presentation. - Neurology Dr. Derry Lory evaluated patient recommended EEG monitoring, checking Keppra and Vimpat level, Keppra load 1500 mg IV once and continue home Keppra 500 mg twice daily and Vimpat 50 mg twice daily, Ativan as needed for seizure and observe overnight for seizure clustering. -Currently patient is alert but not oriented.  It is not safe to give anything orally as of now. -Continue Vimpat 50 IV twice daily and Keppra 500 mg IV twice daily. -Continue Ativan p.o. and for  seizure -Continue neurocheck every 4 hours, fall precaution, aspiration precaution and keep the head of the bed rise 30 degree angle -Neurology ordered video-assisted EEG for overnight -Pending Keppra and Vimpat level -Continue telemonitoring for development of any arrhythmia. - Appreciate neurology input   Concern for alcohol withdrawal Patient has previous history of hospital admission from alcohol withdrawal and alcohol withdrawal related seizure as well.  Blood alcohol level less than 10.  Unsure about heavy drinking of alcohol at this time.  Unable to reach patient's family over phone for confirmation. - Plan to continue CIWA and alcohol withdrawal protocol. - Continue Ativan as needed -Continue thiamine and folic acid IV - Consulted with transition care team for counseled patient tomorrow -Continue fall, aspiration and seizure precaution -Continue maintenance fluid LR 75 cc -Checking morning CBC and CMP   Anion gap metabolic acidosis - Bicarb 12 and anion gap 22 - Suspecting anion gap metabolic acidosis in the setting of alcohol use even though blood ethanol level less than 10. -Continue LR 75 cc/h - Checking morning CMP.   DVT prophylaxis:    Lovenox Code Status:    Full Code.  At this time unable to reach patient's family over phone as well as unable to talk patient regarding CODE STATUS.  From my medical judgment keeping patient full code. Diet:    Currently n.p.o. Family Communication:  none Disposition Plan:     Plan to discharge home in 2 to 3 days Consults:   Neurology Admission status:   Inpatient, Step Down Unit  Severity of Illness: The appropriate patient status for this patient is INPATIENT. Inpatient status is judged to be reasonable and necessary in order to provide the required intensity of service to ensure the patient's safety. The patient's presenting symptoms, physical exam findings, and initial radiographic and laboratory data in  the context of their  chronic comorbidities is felt to place them at high risk for further clinical deterioration. Furthermore, it is not anticipated that the patient will be medically stable for discharge from the hospital within 2 midnights of admission.   * I certify that at the point of admission it is my clinical judgment that the patient will require inpatient hospital care spanning beyond 2 midnights from the point of admission due to high intensity of service, high risk for further deterioration and high frequency of surveillance required.Marland Kitchen    [name] MD [facility]  How to contact the [facility] Attending or Consulting provider 7A - 7P or covering provider during after hours 7P -7A, for this patient?   Check the care team in [facility] and look for a) attending/consulting [facility] provider listed and b) the [facility] team listed Log into [email or web address] and use [place]'s universal password to access. If you do not have the password, please contact the hospital operator. Locate the [facility] provider you are looking for under [facility] and page to a number that you can be directly reached. If you still have difficulty reaching the provider, please page the [facility] (Director on Call) for the Hospitalists listed on amion for assistance.  05/05/2023, 8:45 PM

## 2023-05-05 NOTE — ED Notes (Signed)
Called staffing regarding a sitter for patient. Report will put her on the list.

## 2023-05-05 NOTE — ED Notes (Signed)
RN requested medication to be IM instead of IV due to patient inability to follow commands and agitated behavior.

## 2023-05-05 NOTE — Progress Notes (Signed)
Spoke with RN pt needs to go to CT first and then we can connect to LTM EEG. Will check back as schedule permits.

## 2023-05-05 NOTE — ED Triage Notes (Signed)
Patient bib [facility] from hotel where she lives. Partner reports patient has had 8 seizures since 0430 this morning and did not completely recover after the last 2. EMS witnessed minute long grand mal seizure en route, administered 5mg  midazolam IM and seizure resolve, pt GCS remained 3. GCS 8 upon arrival.

## 2023-05-05 NOTE — ED Provider Notes (Signed)
[place] EMERGENCY DEPARTMENT AT [facility] Provider Note   CSN: [id number] Arrival date & time: 05/05/23  1226     History  No chief complaint on file.   [name] is a 67 y.o. female.  The history is provided by the EMS personnel and medical records. The history is limited by the condition of the patient. No language interpreter was used.  Seizures Seizure activity on arrival: no (with ems she had one seizure)   Seizure type:  Grand mal Initial focality:  None Episode characteristics: generalized shaking, tongue biting and unresponsiveness   Postictal symptoms: confusion and somnolence   Return to baseline: no   Severity:  Severe Timing:  Clustered Number of seizures this episode:  At least 9 Progression:  Unchanged Recent head injury:  No recent head injuries PTA treatment:  Midazolam History of seizures: yes        Home Medications Prior to Admission medications   Medication Sig Start Date End Date Taking? Authorizing Provider  acetaminophen (TYLENOL) 325 MG tablet Take 2 tablets (650 mg total) by mouth every 4 (four) hours as needed for fever, mild pain, moderate pain or headache. 05/23/22   Tyrone Nine, MD  acetaminophen (TYLENOL) 500 MG tablet Take 500 mg by mouth every 6 (six) hours as needed for moderate pain.    [provider]  folic acid (FOLVITE) 1 MG tablet Take 1 mg by mouth daily.    [provider]  folic acid (FOLVITE) 1 MG tablet Take 1 tablet (1 mg total) by mouth daily. 12/22/22   Osvaldo Shipper, MD  gabapentin (NEURONTIN) 300 MG capsule Take 1 capsule (300 mg total) by mouth 2 (two) times daily. 10/21/22   Osvaldo Shipper, MD  gabapentin (NEURONTIN) 300 MG capsule Take 1 capsule (300 mg total) by mouth 2 (two) times daily. 12/21/22   Osvaldo Shipper, MD  ibuprofen (ADVIL) 200 MG tablet Take 200 mg by mouth every 6 (six) hours as needed for fever, headache or mild pain.    [provider]  lacosamide  (VIMPAT) 200 MG TABS tablet Take 1 tablet (200 mg total) by mouth 2 (two) times daily. 10/21/22   Osvaldo Shipper, MD  lacosamide (VIMPAT) 50 MG TABS tablet Take 1 tablet (50 mg total) by mouth 2 (two) times daily. 12/21/22   Osvaldo Shipper, MD  levETIRAcetam (KEPPRA) 500 MG tablet Take 1 tablet (500 mg total) by mouth 2 (two) times daily. 12/21/22   Osvaldo Shipper, MD  levETIRAcetam (KEPPRA) 750 MG tablet Take 2 tablets (1,500 mg total) by mouth 2 (two) times daily. 10/21/22   Osvaldo Shipper, MD  loratadine (CLARITIN) 10 MG tablet Take 1 tablet (10 mg total) by mouth daily. 12/22/22   Osvaldo Shipper, MD  naltrexone (DEPADE) 50 MG tablet Take 50 mg by mouth daily.    [provider]  oxyCODONE (OXY IR/ROXICODONE) 5 MG immediate release tablet Take 1 tablet (5 mg total) by mouth every 6 (six) hours as needed for severe pain. 12/21/22   Osvaldo Shipper, MD  pantoprazole (PROTONIX) 40 MG tablet Take 1 tablet (40 mg total) by mouth daily. 12/21/22 02/19/23  Osvaldo Shipper, MD  polyethylene glycol powder (GLYCOLAX/MIRALAX) 17 GM/SCOOP powder Take 1 capful (17 g) by mouth daily. 12/22/22   Osvaldo Shipper, MD  thiamine (VITAMIN B1) 100 MG tablet Take 1 tablet (100 mg total) by mouth daily. 05/24/22   Tyrone Nine, MD  thiamine (VITAMIN B1) 100 MG tablet Take 1 tablet (100  mg total) by mouth daily. 12/22/22   Osvaldo Shipper, MD  DULoxetine (CYMBALTA) 30 MG capsule Take 1 capsule (30 mg total) by mouth daily. Patient not taking: Reported on 09/01/2020 06/11/20 09/02/20  Money, [name], FNP  metoCLOPramide (REGLAN) 10 MG tablet Take 1 tablet (10 mg total) by mouth every 6 (six) hours as needed for nausea (nausea/headache). 04/06/19 05/24/19  Mesner, Barbara Cower, MD      Allergies    Keflex [cephalexin], Cephalosporins, Prednisone, Toradol [ketorolac tromethamine], and Ultram [tramadol]    Review of Systems   Review of Systems  Unable to perform ROS: Acuity of condition (pt postictal and cannot answer questions)   Neurological:  Positive for seizures.    Physical Exam Updated Vital Signs BP (!) 155/99   Pulse 83   Resp 17   SpO2 98%  Physical Exam Vitals and nursing note reviewed.  Constitutional:      General: She is in acute distress.     Appearance: She is well-developed. She is ill-appearing. She is not toxic-appearing or diaphoretic.  HENT:     Head: Normocephalic and atraumatic.     Nose: No congestion or rhinorrhea.     Mouth/Throat:     Mouth: Mucous membranes are moist.     Pharynx: No oropharyngeal exudate or posterior oropharyngeal erythema.  Eyes:     Extraocular Movements: Extraocular movements intact.     Conjunctiva/sclera: Conjunctivae normal.     Pupils: Pupils are equal, round, and reactive to light.  Cardiovascular:     Rate and Rhythm: Normal rate.     Heart sounds: No murmur heard. Pulmonary:     Effort: Pulmonary effort is normal. No respiratory distress.     Breath sounds: Normal breath sounds. No wheezing, rhonchi or rales.  Chest:     Chest wall: No tenderness.  Abdominal:     General: Abdomen is flat.     Palpations: Abdomen is soft.     Tenderness: There is no abdominal tenderness. There is no guarding or rebound.  Musculoskeletal:        General: No swelling or tenderness.     Cervical back: Neck supple. No tenderness.     Right lower leg: No edema.     Left lower leg: No edema.  Skin:    General: Skin is warm and dry.     Capillary Refill: Capillary refill takes less than 2 seconds.     Findings: No erythema.  Neurological:     Mental Status: She is alert.     GCS: GCS eye subscore is 4. GCS verbal subscore is 4. GCS motor subscore is 5.     Motor: No weakness, tremor or abnormal muscle tone.     Comments: Patient altered and appears postictal.  Patient was able to move upper extremities symmetrically and responded to painful stimuli throughout.  Psychiatric:        Mood and Affect: Mood normal.     ED Results / Procedures / Treatments    Labs (all labs ordered are listed, but only abnormal results are displayed) Labs Reviewed  COMPREHENSIVE METABOLIC PANEL - Abnormal; Notable for the following components:      Result Value   CO2 12 (*)    Glucose, Bld 246 (*)    BUN 6 (*)    AST 54 (*)    Anion gap 22 (*)    All other components within normal limits  CBG MONITORING, ED - Abnormal; Notable for the following components:  Glucose-Capillary 244 (*)    All other components within normal limits  ETHANOL  CBC WITH DIFFERENTIAL/PLATELET  RAPID URINE DRUG SCREEN, HOSP PERFORMED  TSH  LEVETIRACETAM LEVEL  LACOSAMIDE  CBC WITH DIFFERENTIAL/PLATELET  AMMONIA  LACTIC ACID, PLASMA  LACTIC ACID, PLASMA    EKG EKG Interpretation Date/Time:  Wednesday May 05 2023 12:33:11 EDT Ventricular Rate:  135 PR Interval:  149 QRS Duration:  85 QT Interval:  290 QTC Calculation: 435 R Axis:   -71  Text Interpretation: Sinus tachycardia Left anterior fascicular block Abnormal R-wave progression, late transition when compared to prior, similar appearnce. No STEMI Confirmed by Theda Belfast ([id number]) on 05/05/2023 12:45:15 PM  Radiology No results found.  Procedures Procedures    Medications Ordered in ED Medications  levETIRAcetam (KEPPRA) IVPB 1500 mg/ 100 mL premix (has no administration in time range)  lacosamide (VIMPAT) 200 mg in sodium chloride 0.9 % 25 mL IVPB (has no administration in time range)  LORazepam (ATIVAN) injection 2 mg (2 mg Intramuscular Given 05/05/23 1543)    ED Course/ Medical Decision Making/ A&P Clinical Course as of 05/05/23 1616  Wed May 05, 2023  1548 Anion gap(!): 22 AGMA - will get lactate [HN]  1548 CO2(!): 12 [HN]    Clinical Course User Index [HN] Loetta Rough, MD   CRITICAL CARE Performed by: Canary Brim Neha Waight Total critical care time: 20 minutes Critical care time was exclusive of separately billable procedures and treating other patients. Critical care was necessary to  treat or prevent imminent or life-threatening deterioration. Critical care was time spent personally by me on the following activities: development of treatment plan with patient and/or surrogate as well as nursing, discussions with consultants, evaluation of patient's response to treatment, examination of patient, obtaining history from patient or surrogate, ordering and performing treatments and interventions, ordering and review of laboratory studies, ordering and review of radiographic studies, pulse oximetry and re-evaluation of patient's condition.                            Medical Decision Making Amount and/or Complexity of Data Reviewed Labs: ordered. Decision-making details documented in ED Course. Radiology: ordered.  Risk Prescription drug management.    Kelena Eniola Cerullo is a 67 y.o. female with a past medical history significant for previous subarachnoid hemorrhage, alcohol withdrawal seizures, previous status epilepticus, depression, anxiety, diverticulosis, previous cholecystectomy and hysterectomy, and pancreatitis who presents for seizures.  According to EMS, patient had approximately 8 seizures since early this morning per family and had a seizure with EMS.  Over the last few hours patient has not returned back to her baseline.  EMS reports they did find a bottle with seizure medication at scene but family had mentioned that patient had not been taking her seizure medicine because she was out of it.  Family is not yet here, unclear if there was any changes to alcohol intake.  No reported trauma.  Glucose was over 200 with EMS showed no hypoglycemia.  No other changes reported.  EMS gave 5 IM Versed to treat seizure that they saw.  On my evaluation, patient has some dried blood on mouth and has a small abrasion to the tip of her tongue.  No large laceration seen initially.  Patient is able to open her eyes and follow some commands with loud yelling by this provider.  Oxygen  saturations are in the 90s on nonrebreather and we will try to de-escalate to  nasal cannula.  Patient was able to squeeze both hands on command but did not want to move her legs for me.  She was rebounding to painful stimuli as well.  Lungs were clear and chest was nontender.  Abdomen nontender.  Chart review shows that patient was admitted for multiple seizures several months ago.  It appears she is on Keppra and Vimpat.  Will call neurology to discuss checking levels or what to load her with.  Given this report of at least 9 seizures today and not returning to baseline, anticipate admission for further management.  Will get CT head and labs.  Patient is protecting her airway at this time.  Care transferred to oncoming team to wait for results of diagnostic workup and CT imaging.  Neurology recommended admission for further management with is completed.  Care transferred while awaiting labs and workup to be completed.         Final Clinical Impression(s) / ED Diagnoses Final diagnoses:  Seizure (HCC)    Clinical Impression: 1. Seizure [employer])     Disposition: Care transferred to oncoming team to wait for lab and imaging results prior to likely admission for altered mental status suspicious for recurrent seizures.  This note was prepared with assistance of Conservation officer, historic buildings. Occasional wrong-word or sound-a-like substitutions may have occurred due to the inherent limitations of voice recognition software.      Latavius Capizzi, Canary Brim, MD 05/05/23 [phone]

## 2023-05-05 NOTE — Progress Notes (Signed)
LTM EEG hooked up and running - no initial skin breakdown - push button tested - Not Monitored due to being in ED. Patient will be moving to ED 12 from [facility]

## 2023-05-05 NOTE — ED Notes (Signed)
After ativan administration patient remains agitated, pulling at cords and gown and moving around in the bed. Soft restraints remain in place for patient safety.

## 2023-05-05 NOTE — Consult Note (Signed)
NEUROLOGY CONSULTATION NOTE   Date of service: May 05, 2023 Patient Name: [name] MRN:  [id number] DOB:  11-03-1955 Reason for consult: "seizure clustering" Requesting Provider: Tegeler, Canary Brim, * _ _ _   _ __   _ __ _ _  __ __   _ __   __ _  History of Present Illness  [name] is a 67 y.o. female with PMH significant for EtOh use, seizure and noncompliant with Keppra and Vimpat, depression, who presents with apparently 9 generalized tonic-clonic seizures.  She did not return to her baseline after the last 2 seizures.  Family called EMS, she initially had a GCS of 3 which improved to a GCS of 8 and is now somnolent but opens eyes to voice, confused, agitated but gives me a thumbs up on command and sticks her tongue out on command.  She bit her tongue and has a laceration on her tongue with dried blood in her mouth.  Unable to provide history as she is post ictal and somnolent.    ROS   Unable to obtain ROS 2/2 post ictal.  Past History   Past Medical History:  Diagnosis Date   Alcohol dependence (HCC)    Anxiety    Chronic pain    Depression    Diverticulitis    Herniated cervical disc    Pancreatitis    Seizures (HCC)    alcoholic seizures   Past Surgical History:  Procedure Laterality Date   ABDOMINAL HYSTERECTOMY     ANKLE SURGERY Right    CARPAL TUNNEL RELEASE Bilateral    CERVICAL FUSION     CHOLECYSTECTOMY     KNEE SURGERY     Family History  Family history unknown: Yes   Social History   Socioeconomic History   Marital status: Single    Spouse name: Not on file   Number of children: Not on file   Years of education: Not on file   Highest education level: Not on file  Occupational History   Not on file  Tobacco Use   Smoking status: Not on file   Smokeless tobacco: Never  Vaping Use   Vaping status: Never Used  Substance and Sexual Activity   Alcohol use: Yes    Comment: drinks a fifth of vodka daily   Drug use:  Not Currently    Types: Marijuana   Sexual activity: Not Currently  Other Topics Concern   Not on file  Social History Narrative   ** Merged History Encounter **       Pt lives in a [place] [address]; lives with boyfriend.   Social Determinants of Health   Financial Resource Strain: Medium Risk (09/27/2018)   Overall Financial Resource Strain (CARDIA)    Difficulty of Paying Living Expenses: Somewhat hard  Food Insecurity: No Food Insecurity (10/17/2022)   Hunger Vital Sign    Worried About Running Out of Food in the Last Year: Never true    Ran Out of Food in the Last Year: Never true  Transportation Needs: No Transportation Needs (10/17/2022)   PRAPARE - Administrator, Civil Service (Medical): No    Lack of Transportation (Non-Medical): No  Physical Activity: Unknown (09/27/2018)   Exercise Vital Sign    Days of Exercise per Week: Patient declined    Minutes of Exercise per Session: Patient declined  Stress: No Stress Concern Present (09/27/2018)   [employer] of Occupational Health - Occupational Stress Questionnaire  Feeling of Stress : Only a little  Social Connections: Unknown (09/27/2018)   Social Connection and Isolation Panel [NHANES]    Frequency of Communication with Friends and Family: Patient declined    Frequency of Social Gatherings with Friends and Family: Patient declined    Attends Religious Services: Patient declined    Database administrator or Organizations: Patient declined    Attends Banker Meetings: Patient declined    Marital Status: Patient declined   Allergies  Allergen Reactions   Keflex [Cephalexin] Hives   Cephalosporins Hives   Prednisone Swelling   Toradol [Ketorolac Tromethamine] Hives   Ultram [Tramadol] Hives    Medications  (Not in a hospital admission)    Vitals   Vitals:   05/05/23 1232 05/05/23 1233 05/05/23 1351  BP: (!) 155/99 (!) 155/99   Pulse:  98 83  Resp:  (!) 27 17  SpO2:  95% 98%      There is no height or weight on file to calculate BMI.  Physical Exam   General: Laying comfortably in bed; in no acute distress.  HENT: Normal oropharynx and mucosa. Normal external appearance of ears and nose.  Neck: Supple, no pain or tenderness  CV: No JVD. No peripheral edema.  Pulmonary: Symmetric Chest rise. Normal respiratory effort.  Abdomen: Soft to touch, non-tender.  Ext: No cyanosis, edema, or deformity  Skin: No rash. Normal palpation of skin.   Musculoskeletal: Normal digits and nails by inspection. No clubbing.   Neurologic Examination  Mental status/Cognition: somnolent, opens eyes to voice. Does not answer any orientation questions or follow commands. Speech/language: mumbles, incomprehensible, did give me a thumbs up and stick her tongue out on command. Cranial nerves:   CN II Pupils equal and reactive to light, unable to asess for VF deficits.   CN III,IV,VI EOM intact, no gaze preference or deviation, no nystagmus    CN V Corneals intact BL   CN VII no asymmetry, no nasolabial fold flattening    CN VIII Made brief eye contact to speech   CN IX & X Protecting her airway   CN XI Head midline   CN XII midline tongue protrusion   Motor:  Muscle bulk: poor, tone normal. No tremors or seizures. Spontaneous antigravity movement in all extremities. Attempting to sit up in the bed.  Sensation:  localizes to pain in all extremities  Coordination/Complex Motor:  Unable to assess  Labs   CBC: No results for input(s): "WBC", "NEUTROABS", "HGB", "HCT", "MCV", "PLT" in the last 168 hours.  Basic Metabolic Panel:  Lab Results  Component Value Date   NA 136 05/05/2023   K 3.9 05/05/2023   CO2 12 (L) 05/05/2023   GLUCOSE 246 (H) 05/05/2023   BUN 6 (L) 05/05/2023   CREATININE 0.95 05/05/2023   CALCIUM 9.3 05/05/2023   GFRNONAA >60 05/05/2023   GFRAA >60 06/09/2020   Lipid Panel:  Lab Results  Component Value Date   LDLCALC 58 06/09/2020   HgbA1c:  Lab  Results  Component Value Date   HGBA1C 5.0 05/17/2022   Urine Drug Screen:     Component Value Date/Time   LABOPIA NONE DETECTED 10/17/2022 0454   COCAINSCRNUR NONE DETECTED 10/17/2022 0454   LABBENZ POSITIVE (A) 10/17/2022 0454   AMPHETMU NONE DETECTED 10/17/2022 0454   THCU NONE DETECTED 10/17/2022 0454   LABBARB NONE DETECTED 10/17/2022 0454    Alcohol Level     Component Value Date/Time   ETH <10 05/05/2023 1312  CT Head without contrast(Personally reviewed): CTH was negative for a large hypodensity concerning for a large territory infarct or hyperdensity concerning for an ICH  cEEG:  pending  Impression   [name] is a 67 y.o. female with PMH significant for EtOh use, seizure and noncompliant with Keppra and Vimpat, depression, who presents with apparently 9 generalized tonic-clonic seizures. Likely post ictal now. Was able to give me a thumbs up and stick he tongue out to voice but required a lot of stimulation to be able to follow commands. She is moving all extremities spontaneously and antigravity  Recommendations  - LTM EEG - Keppra and Vimpat levels - Keppra load of 1500mg  IV once - continue home Keppra 500 BID and Vimpat 50mg  BID - Ativan 1-2 mg for seizure lasting more than 3 mins - observe overnight for seizure clustering. - no driving for 6 months. Will have to reiterate this whe she is more awake. ______________________________________________________________________   Thank you for the opportunity to take part in the care of this patient. If you have any further questions, please contact the neurology consultation attending.  Signed,  Erick Blinks [facility] _ _ _   _ __   _ __ _ _  __ __   _ __   __ _

## 2023-05-05 NOTE — ED Provider Notes (Signed)
8:13 PM Assumed care of patient from off-going team. For more details, please see note from same day.  In brief, this is a 67 y.o. female with status epilepticus. S/p ativan/keppra/vimpat. Protecting airway. Scraped tongue, no big laceration. Received midazolam with EMS.  Plan/Dispo at time of sign-out & ED Course since sign-out: [ ]  CTH, labs, then admit to medicine  BP (!) 140/87   Pulse 86   Temp 98.2 F (36.8 C) (Axillary)   Resp (!) 23   SpO2 100%    ED Course:   Clinical Course as of 05/05/23 2013  Wed May 05, 2023  1548 Anion gap(!): 22 AGMA - will get lactate [HN]  1548 CO2(!): 12 [HN]  1705 CT HEAD WO CONTRAST ( ) No acute bleed [HN]  1705 Had given an additional 1 mg ativan IV for CT scan as patient was moving around and not redirectable [HN]  1823 Comprehensive metabolic panel(!) Mild NAGMA, otherwise unrevealing [HN]  1823 Alcohol, Ethyl (B): <10 Neg [HN]  2012 Lactate wnl, CBC unremarkable. Keppra/lacosamide levels pending. D/w hospitalist. Has received total of 3 mg IV/IM ativan here in the department. Does have h/o chronic alcohol use. She is not tachycardic or hypertensive, though cannot say definitively that she didn't have an alcohol withdrawal seizure, though it seems more likely medication noncompliance. Pt admitted to hospitalist.  [HN]    Clinical Course User Index [HN] Loetta Rough, MD    ------------------------------- Vivi Barrack, MD Emergency Medicine  This note was created using dictation software, which may contain spelling or grammatical errors.   Loetta Rough, MD 05/05/23 2013

## 2023-05-06 DIAGNOSIS — G9341 Metabolic encephalopathy: Secondary | ICD-10-CM | POA: Diagnosis not present

## 2023-05-06 DIAGNOSIS — G40901 Epilepsy, unspecified, not intractable, with status epilepticus: Secondary | ICD-10-CM | POA: Diagnosis not present

## 2023-05-06 DIAGNOSIS — Z87898 Personal history of other specified conditions: Secondary | ICD-10-CM | POA: Diagnosis not present

## 2023-05-06 DIAGNOSIS — F10930 Alcohol use, unspecified with withdrawal, uncomplicated: Secondary | ICD-10-CM

## 2023-05-06 LAB — COMPREHENSIVE METABOLIC PANEL
ALT: 25 U/L (ref 0–44)
AST: 31 U/L (ref 15–41)
Albumin: 3.5 g/dL (ref 3.5–5.0)
Alkaline Phosphatase: 61 U/L (ref 38–126)
Anion gap: 8 (ref 5–15)
BUN: 7 mg/dL — ABNORMAL LOW (ref 8–23)
CO2: 22 mmol/L (ref 22–32)
Calcium: 8.5 mg/dL — ABNORMAL LOW (ref 8.9–10.3)
Chloride: 107 mmol/L (ref 98–111)
Creatinine, Ser: 0.71 mg/dL (ref 0.44–1.00)
GFR, Estimated: >60 mL/min (ref >60)
Glucose, Bld: 118 mg/dL — ABNORMAL HIGH (ref 70–99)
Potassium: 3.1 mmol/L — ABNORMAL LOW (ref 3.5–5.1)
Sodium: 137 mmol/L (ref 135–145)
Total Bilirubin: 1 mg/dL (ref 0.3–1.2)
Total Protein: 6.6 g/dL (ref 6.5–8.1)

## 2023-05-06 LAB — CBC
HCT: 36.4 % (ref 36.0–46.0)
Hemoglobin: 11.9 g/dL — ABNORMAL LOW (ref 12.0–15.0)
MCH: 31.3 pg (ref 26.0–34.0)
MCHC: 32.7 g/dL (ref 30.0–36.0)
MCV: 95.8 fL (ref 80.0–100.0)
Platelets: 157 10*3/uL (ref 150–400)
RBC: 3.8 MIL/uL — ABNORMAL LOW (ref 3.87–5.11)
RDW: 15.3 % (ref 11.5–15.5)
WBC: 8.6 10*3/uL (ref 4.0–10.5)
nRBC: 0 % (ref 0.0–0.2)

## 2023-05-06 LAB — CBG MONITORING, ED
Glucose-Capillary: 117 mg/dL — ABNORMAL HIGH (ref 70–99)
Glucose-Capillary: 97 mg/dL (ref 70–99)

## 2023-05-06 LAB — HIV ANTIBODY (ROUTINE TESTING W REFLEX): HIV Screen 4th Generation wRfx: NONREACTIVE

## 2023-05-06 LAB — GLUCOSE, CAPILLARY: Glucose-Capillary: 86 mg/dL (ref 70–99)

## 2023-05-06 MED ORDER — POTASSIUM CHLORIDE 10 MEQ/100ML IV SOLN
10.0000 meq | INTRAVENOUS | Status: AC
  Administered 2023-05-06 (×4): 10 meq via INTRAVENOUS
  Filled 2023-05-06 (×4): qty 100

## 2023-05-06 MED ORDER — GABAPENTIN 300 MG PO CAPS
300.0000 mg | ORAL_CAPSULE | Freq: Two times a day (BID) | ORAL | Status: DC
  Administered 2023-05-06 – 2023-05-09 (×6): 300 mg via ORAL
  Filled 2023-05-06 (×6): qty 1

## 2023-05-06 MED ORDER — ACETAMINOPHEN 325 MG PO TABS
650.0000 mg | ORAL_TABLET | ORAL | Status: DC | PRN
  Administered 2023-05-06 – 2023-05-08 (×3): 650 mg via ORAL
  Filled 2023-05-06 (×3): qty 2

## 2023-05-06 NOTE — Progress Notes (Signed)
LTM maint complete - no skin breakdown, Study online, all leads attached.

## 2023-05-06 NOTE — Procedures (Addendum)
Patient Name: [name]  MRN: [id number]  Epilepsy Attending: Charlsie Quest  Referring Physician/Provider: Erick Blinks, MD  Duration: 05/05/2023 2149 to 05/06/2023 2149  Patient history: 67 y.o. female with PMH significant for EtOh use, seizure and noncompliant with Keppra and Vimpat, depression, who presents with apparently 9 generalized tonic-clonic seizures. EEG to evaluate for seizure.  Level of alertness: Awake, asleep  AEDs during EEG study: LEV, LCM  Technical aspects: This EEG study was done with scalp electrodes positioned according to the 10-20 International system of electrode placement. Electrical activity was reviewed with band pass filter of 1-70Hz , sensitivity of 7 uV/mm, display speed of 74mm/sec with a 60Hz  notched filter applied as appropriate. EEG data were recorded continuously and digitally stored.  Video monitoring was available and reviewed as appropriate.  Description: No clear posterior dominant rhythm was seen. Sleep was characterized by vertex waves, sleep spindles (12 to 14 Hz), maximal frontocentral region. EEG showed continuous generalized and maximal right posterior temporal 3 to 6 Hz theta-delta slowing admixed with an excessive amount of 15 to 18 Hz beta activity distributed symmetrically and diffusely. Hyperventilation and photic stimulation were not performed.  Of note, parts of study were difficult to interpret due to significant electrode artifact      ABNORMALITY - Continuous slow, generalized and maximal right posterior temporal  IMPRESSION: This study is suggestive of cortical dysfunction arising from right posterior temporal region likely secondary to underlying structural abnormality. Additionally there is  moderate diffuse encephalopathy. No seizures or epileptiform discharges were seen throughout the recording.  Please note lack of epileptiform activity during interictal eeg doesn't exclude diagnosis of epilepsy.   [name]

## 2023-05-06 NOTE — ED Notes (Signed)
ED TO INPATIENT HANDOFF REPORT  ED Nurse Name and Phone #:   S Name/Age/Gender [name] 67 y.o. female Room/Bed: 012C/012C  Code Status   Code Status: Full Code  Home/SNF/Other Home Patient oriented to: self, place, and situation Is this baseline? Yes   Triage Complete: Triage complete  Chief Complaint Status epilepticus [facility]) [G40.901]  Triage Note Patient bib [facility] from hotel where she lives. Partner reports patient has had 8 seizures since 0430 this morning and did not completely recover after the last 2. EMS witnessed minute long grand mal seizure en route, administered 5mg  midazolam IM and seizure resolve, pt GCS remained 3. GCS 8 upon arrival.    Allergies Allergies  Allergen Reactions   Keflex [Cephalexin] Hives   Cephalosporins Hives   Prednisone Swelling   Toradol [Ketorolac Tromethamine] Hives   Ultram [Tramadol] Hives    Level of Care/Admitting Diagnosis ED Disposition     ED Disposition  Admit   Condition  --   Comment  Hospital Area: [facility] [[id number]]  Level of Care: Progressive [102]  Admit to Progressive based on following criteria: NEUROLOGICAL AND NEUROSURGICAL complex patients with significant risk of instability, who do not meet ICU criteria, yet require close observation or frequent assessment (< / = every 2 - 4 hours) with medical / nursing intervention.  May admit patient to [facility] or [facility] if equivalent level of care is available:: No  Covid Evaluation: Asymptomatic - no recent exposure (last 10 days) testing not required  Diagnosis: Status epilepticus [facility]) [[id number]]  Admitting Physician: [name] [[id number]]  Attending Physician: [name] [[id number]]  Certification:: I certify this patient will need inpatient services for at least 2 midnights          B Medical/Surgery History Past Medical History:  Diagnosis Date   Alcohol dependence (HCC)    Anxiety    Chronic pain     Depression    Diverticulitis    Herniated cervical disc    Pancreatitis    Seizures (HCC)    alcoholic seizures   Past Surgical History:  Procedure Laterality Date   ABDOMINAL HYSTERECTOMY     ANKLE SURGERY Right    CARPAL TUNNEL RELEASE Bilateral    CERVICAL FUSION     CHOLECYSTECTOMY     KNEE SURGERY       A IV Location/Drains/Wounds Patient Lines/Drains/Airways Status     Active Line/Drains/Airways     Name Placement date Placement time Site Days   Peripheral IV 05/05/23 20 G 1.88" Left;Upper Arm 05/05/23  1602  Arm  1   Peripheral IV 05/06/23 20 G 1.88" Anterior;Left;Proximal;Upper Arm 05/06/23  0845  Arm  less than 1            Intake/Output Last 24 hours No intake or output data in the 24 hours ending 05/06/23 1320  Labs/Imaging Results for orders placed or performed during the hospital encounter of 05/05/23 (from the past 48 hour(s))  CBG monitoring, ED     Status: Abnormal   Collection Time: 05/05/23 12:34 PM  Result Value Ref Range   Glucose-Capillary 244 (H) 70 - 99 mg/dL    Comment: Glucose reference range applies only to samples taken after fasting for at least 8 hours.  Comprehensive metabolic panel     Status: Abnormal   Collection Time: 05/05/23  1:12 PM  Result Value Ref Range   Sodium 136 135 - 145 mmol/L   Potassium 3.9 3.5 - 5.1 mmol/L  Chloride 102 98 - 111 mmol/L   CO2 12 (L) 22 - 32 mmol/L   Glucose, Bld 246 (H) 70 - 99 mg/dL    Comment: Glucose reference range applies only to samples taken after fasting for at least 8 hours.   BUN 6 (L) 8 - 23 mg/dL   Creatinine, Ser 4.09 0.44 - 1.00 mg/dL   Calcium 9.3 8.9 - 81.1 mg/dL   Total Protein 7.6 6.5 - 8.1 g/dL   Albumin 4.3 3.5 - 5.0 g/dL   AST 54 (H) 15 - 41 U/L   ALT 34 0 - 44 U/L   Alkaline Phosphatase 85 38 - 126 U/L   Total Bilirubin 0.6 0.3 - 1.2 mg/dL   GFR, Estimated >91 >47 mL/min    Comment: (NOTE) Calculated using the CKD-EPI Creatinine Equation (2021)    Anion gap 22 (H) 5  - 15    Comment: ELECTROLYTES REPEATED TO VERIFY Performed at [facility], [address] [place], [place] [address]   Ethanol     Status: None   Collection Time: 05/05/23  1:12 PM  Result Value Ref Range   Alcohol, Ethyl (B) <10 <10 mg/dL    Comment: (NOTE) Lowest detectable limit for serum alcohol is 10 mg/dL.  For medical purposes only. Performed at [facility], [address] [place], [place] [address]   TSH     Status: None   Collection Time: 05/05/23  1:12 PM  Result Value Ref Range   TSH 1.284 0.350 - 4.500 uIU/mL    Comment: Performed by a 3rd Generation assay with a functional sensitivity of <=0.01 uIU/mL. Performed at [facility], [address] [place], [place] [address]   CBC with Differential/Platelet     Status: Abnormal   Collection Time: 05/05/23  6:55 PM  Result Value Ref Range   WBC 9.6 4.0 - 10.5 K/uL   RBC 3.99 3.87 - 5.11 MIL/uL   Hemoglobin 12.5 12.0 - 15.0 g/dL   HCT 69.6 29.5 - 28.4 %   MCV 95.5 80.0 - 100.0 fL   MCH 31.3 26.0 - 34.0 pg   MCHC 32.8 30.0 - 36.0 g/dL   RDW 13.2 44.0 - 10.2 %   Platelets 176 150 - 400 K/uL   nRBC 0.0 0.0 - 0.2 %   Neutrophils Relative % 86 %   Neutro Abs 8.2 (H) 1.7 - 7.7 K/uL   Lymphocytes Relative 9 %   Lymphs Abs 0.9 0.7 - 4.0 K/uL   Monocytes Relative 5 %   Monocytes Absolute 0.5 0.1 - 1.0 K/uL   Eosinophils Relative 0 %   Eosinophils Absolute 0.0 0.0 - 0.5 K/uL   Basophils Relative 0 %   Basophils Absolute 0.0 0.0 - 0.1 K/uL   Immature Granulocytes 0 %   Abs Immature Granulocytes 0.03 0.00 - 0.07 K/uL    Comment: Performed at [facility], [address] [place], [place] [address]  Lactic acid, plasma     Status: None   Collection Time: 05/05/23  6:55 PM  Result Value Ref Range   Lactic Acid, Venous 1.3 0.5 - 1.9 mmol/L    Comment: Performed at [facility], [address], [place], [place] [address]  Rapid urine drug screen (hospital performed)     Status: Abnormal    Collection Time: 05/05/23  8:18 PM  Result Value Ref Range   Opiates NONE DETECTED NONE DETECTED   Cocaine NONE DETECTED NONE DETECTED  Benzodiazepines POSITIVE (A) NONE DETECTED   Amphetamines NONE DETECTED NONE DETECTED   Tetrahydrocannabinol NONE DETECTED NONE DETECTED   Barbiturates NONE DETECTED NONE DETECTED    Comment: (NOTE) DRUG SCREEN FOR MEDICAL PURPOSES ONLY.  IF CONFIRMATION IS NEEDED FOR ANY PURPOSE, NOTIFY LAB WITHIN 5 DAYS.  LOWEST DETECTABLE LIMITS FOR URINE DRUG SCREEN Drug Class                     Cutoff (ng/mL) Amphetamine and metabolites    1000 Barbiturate and metabolites    200 Benzodiazepine                 200 Opiates and metabolites        300 Cocaine and metabolites        300 THC                            50 Performed at [facility], [address], [place], [place] [address]   Ammonia     Status: None   Collection Time: 05/05/23 10:07 PM  Result Value Ref Range   Ammonia 11 9 - 35 umol/L    Comment: Performed at [facility], [address], [place], [place] [address]  HIV Antibody (routine testing w rflx)     Status: None   Collection Time: 05/05/23 10:07 PM  Result Value Ref Range   HIV Screen 4th Generation wRfx Non Reactive Non Reactive    Comment: Performed at [facility], [address] [place], [place] [address]  Comprehensive metabolic panel     Status: Abnormal   Collection Time: 05/06/23  1:41 AM  Result Value Ref Range   Sodium 137 135 - 145 mmol/L   Potassium 3.1 (L) 3.5 - 5.1 mmol/L   Chloride 107 98 - 111 mmol/L   CO2 22 22 - 32 mmol/L   Glucose, Bld 118 (H) 70 - 99 mg/dL    Comment: Glucose reference range applies only to samples taken after fasting for at least 8 hours.   BUN 7 (L) 8 - 23 mg/dL   Creatinine, Ser 2.13 0.44 - 1.00 mg/dL   Calcium 8.5 (L) 8.9 - 10.3 mg/dL   Total Protein 6.6 6.5 - 8.1 g/dL   Albumin 3.5 3.5 - 5.0 g/dL   AST 31 15 - 41 U/L   ALT 25 0 - 44 U/L   Alkaline Phosphatase 61  38 - 126 U/L   Total Bilirubin 1.0 0.3 - 1.2 mg/dL   GFR, Estimated >08 >65 mL/min    Comment: (NOTE) Calculated using the CKD-EPI Creatinine Equation (2021)    Anion gap 8 5 - 15    Comment: Performed at [facility], [address] [place], [place] [address]  CBC     Status: Abnormal   Collection Time: 05/06/23  1:41 AM  Result Value Ref Range   WBC 8.6 4.0 - 10.5 K/uL   RBC 3.80 (L) 3.87 - 5.11 MIL/uL   Hemoglobin 11.9 (L) 12.0 - 15.0 g/dL   HCT 62.9 52.8 - 41.3 %   MCV 95.8 80.0 - 100.0 fL   MCH 31.3 26.0 - 34.0 pg   MCHC 32.7 30.0 - 36.0 g/dL   RDW 24.4 01.0 - 27.2 %   Platelets 157 150 - 400 K/uL   nRBC 0.0 0.0 - 0.2 %    Comment: Performed at [facility], [address] [place], [place] [address]  CBG monitoring, ED     Status: Abnormal   Collection Time: 05/06/23  4:37 AM  Result Value Ref Range   Glucose-Capillary 117 (H) 70 - 99 mg/dL    Comment: Glucose reference range applies only to samples taken after fasting for at least 8 hours.  CBG monitoring, ED     Status: None   Collection Time: 05/06/23  8:29 AM  Result Value Ref Range   Glucose-Capillary 97 70 - 99 mg/dL    Comment: Glucose reference range applies only to samples taken after fasting for at least 8 hours.   Comment 1 Notify RN    Comment 2 Document in Chart    Overnight EEG with video  Result Date: 05/06/2023 Charlsie Quest, MD     05/06/2023  6:47 AM Patient Name: [name] MRN: [id number] Epilepsy Attending: Charlsie Quest Referring Physician/Provider: Erick Blinks, MD Duration: 05/05/2023 2149 to 05/06/2023 1914 Patient history: 67 y.o. female with PMH significant for EtOh use, seizure and noncompliant with Keppra and Vimpat, depression, who presents with apparently 9 generalized tonic-clonic seizures. EEG to evaluate for seizure. Level of alertness: Awake, asleep AEDs during EEG study: LEV, LCM Technical aspects: This EEG study was done with scalp electrodes positioned  according to the 10-20 International system of electrode placement. Electrical activity was reviewed with band pass filter of 1-70Hz , sensitivity of 7 uV/mm, display speed of 78mm/sec with a 60Hz  notched filter applied as appropriate. EEG data were recorded continuously and digitally stored.  Video monitoring was available and reviewed as appropriate. Description: No clear posterior dominant rhythm was seen. Sleep was characterized by vertex waves, sleep spindles (12 to 14 Hz), maximal frontocentral region. EEG showed continuous generalized and maximal right posterior temporal 3 to 6 Hz theta-delta slowing admixed with an excessive amount of 15 to 18 Hz beta activity distributed symmetrically and diffusely. Hyperventilation and photic stimulation were not performed.   ABNORMALITY - Continuous slow, generalized and maximal right posterior temporal IMPRESSION: This study is suggestive of cortical dysfunction arising from right posterior temporal region likely secondary to underlying structural abnormality. Additionally there is Moderate diffuse encephalopathy. No seizures or epileptiform discharges were seen throughout the recording. Please note lack of epileptiform activity during interictal eeg doesn't exclude diagnosis of epilepsy. Priyanka Annabelle Harman   CT HEAD WO CONTRAST ( )  Result Date: 05/05/2023 CLINICAL DATA:  Mental status change, persistent or worsening. Seizures. EXAM: CT HEAD WITHOUT CONTRAST TECHNIQUE: Contiguous axial images were obtained from the base of the skull through the vertex without intravenous contrast. RADIATION DOSE REDUCTION: This exam was performed according to the departmental dose-optimization program which includes automated exposure control, adjustment of the mA and/or kV according to patient size and/or use of iterative reconstruction technique. COMPARISON:  10/17/2022 FINDINGS: Brain: No evidence of acute infarction, hemorrhage, hydrocephalus, extra-axial collection or mass  lesion/mass effect. Mild diffuse cerebral atrophy. Vascular: No hyperdense vessel or unexpected calcification. Skull: Normal. Negative for fracture or focal lesion. Sinuses/Orbits: No acute finding. Other: None. IMPRESSION: No acute intracranial abnormalities.  Mild chronic atrophy. Electronically Signed   By: Burman Nieves M.D.   On: 05/05/2023 16:41    Pending Labs Unresulted Labs (From admission, onward)     Start     Ordered   05/07/23 0500  Basic metabolic panel  Tomorrow morning,   R        05/06/23 1137   05/07/23 0500  CBC  Tomorrow morning,   R  05/06/23 1137   05/07/23 0500  Magnesium  Tomorrow morning,   R        05/06/23 1137   05/05/23 1415  Levetiracetam level  Once,   R        05/05/23 1415   05/05/23 1415  Lacosamide  Once,   R        05/05/23 1415   05/05/23 1240  CBC with Differential  Once,   STAT        05/05/23 1242            Vitals/Pain Today's Vitals   05/06/23 1025 05/06/23 1030 05/06/23 1041 05/06/23 1045  BP:  (!) 144/93    Pulse:  72  79  Resp:  18  19  Temp:      TempSrc:      SpO2:  98% 93% 99%  PainSc: 0-No pain       Isolation Precautions No active isolations  Medications Medications  levETIRAcetam (KEPPRA) tablet 500 mg ( Oral See Alternative 05/06/23 1052)    Or  levETIRAcetam (KEPPRA) IVPB 500 mg/100 mL premix (0 mg Intravenous Stopped 05/06/23 1052)  lacosamide (VIMPAT) tablet 50 mg ( Oral See Alternative 05/06/23 1319)    Or  lacosamide (VIMPAT) 50 mg in sodium chloride 0.9 % 25 mL IVPB (50 mg Intravenous New Bag/Given 05/06/23 1319)  LORazepam (ATIVAN) tablet 1-4 mg (has no administration in time range)    Or  LORazepam (ATIVAN) injection 1-4 mg (has no administration in time range)  thiamine (VITAMIN B1) tablet 100 mg ( Oral See Alternative 05/06/23 1032)    Or  thiamine (VITAMIN B1) injection 100 mg (100 mg Intravenous Given 05/06/23 1032)  multivitamin with minerals tablet 1 tablet (1 tablet Oral Not Given 05/05/23 2251)   sodium chloride flush (NS) 0.9 % injection 3 mL (3 mLs Intravenous Given 05/06/23 1039)  sodium chloride flush (NS) 0.9 % injection 3 mL (has no administration in time range)  0.9 %  sodium chloride infusion (has no administration in time range)  lactated ringers infusion ( Intravenous New Bag/Given 05/06/23 0213)  folic acid injection 1 mg (1 mg Intravenous Given 05/06/23 1029)  enoxaparin (LOVENOX) injection 40 mg (40 mg Subcutaneous Given 05/05/23 2251)  sodium chloride flush (NS) 0.9 % injection 3 mL (3 mLs Intravenous Given 05/05/23 2207)  sodium chloride flush (NS) 0.9 % injection 3 mL (has no administration in time range)  0.9 %  sodium chloride infusion (has no administration in time range)  ondansetron (ZOFRAN) tablet 4 mg (has no administration in time range)    Or  ondansetron (ZOFRAN) injection 4 mg (has no administration in time range)  hydrALAZINE (APRESOLINE) injection 10 mg (has no administration in time range)  acetaminophen (TYLENOL) suppository 650 mg (has no administration in time range)  LORazepam (ATIVAN) injection 2 mg (has no administration in time range)  potassium chloride 10 mEq in 100 mL IVPB (has no administration in time range)  levETIRAcetam (KEPPRA) IVPB 1500 mg/ 100 mL premix (0 mg Intravenous Stopped 05/05/23 1848)  lacosamide (VIMPAT) 200 mg in sodium chloride 0.9 % 25 mL IVPB (0 mg Intravenous Stopped 05/05/23 1943)  LORazepam (ATIVAN) injection 2 mg (2 mg Intramuscular Given 05/05/23 1543)  LORazepam (ATIVAN) injection 1 mg (1 mg Intravenous Given 05/05/23 1706)  acetaminophen (OFIRMEV) IV 500 mg (0 mg Intravenous Stopped 05/05/23 2345)    Mobility non-ambulatory     Focused Assessments Neuro Assessment Handoff:  Swallow screen pass? Yes  Neuro Assessment: Exceptions to WDL Neuro Checks:      Has TPA been given?  If patient is a Neuro Trauma and patient is going to OR before floor call report to 4N Charge nurse: [phone] or  [phone]   R Recommendations: See Admitting Provider Note  Report given to:   Additional Notes:

## 2023-05-06 NOTE — Plan of Care (Signed)

## 2023-05-06 NOTE — Progress Notes (Signed)
Neurology Progress Note  Brief HPI: 67 year old patient with history of seizure disorder with noncompliance to medication regimen, EtOH use and depression presented after having 9 seizures at home.  She has not returned to her mental baseline but has been slightly improving.  Subjective: Patient is seen in her room with no family at the bedside.  Postictal confusion continues.  She is able to state her name but not location and cannot give a history of events leading up to her hospitalization. Exam: Vitals:   05/06/23 1000 05/06/23 1019  BP: 123/79   Pulse: 71   Resp: (!) 25   Temp:  98 F (36.7 C)  SpO2: 97%    Gen: In bed, NAD Resp: non-labored breathing, no acute distress Abd: soft, nt  Neuro: Mental Status: Patient is alert, able to state name but not location time or situation.  She is intermittently able to follow simple commands Cranial Nerves: Pupils equal round and reactive to light, extraocular movements intact, face symmetrical, hearing intact to voice, phonation normal Motor: Able to move all 4 extremities to command with good antigravity strength Sensory: Intact to light touch throughout Gait: Deferred  Pertinent Labs:    Latest Ref Rng & Units 05/06/2023    1:41 AM 05/05/2023    6:55 PM 12/20/2022    9:06 AM  CBC  WBC 4.0 - 10.5 K/uL 8.6  9.6  5.9   Hemoglobin 12.0 - 15.0 g/dL 62.9  52.8  41.3   Hematocrit 36.0 - 46.0 % 36.4  38.1  42.0   Platelets 150 - 400 K/uL 157  176  476        Latest Ref Rng & Units 05/06/2023    1:41 AM 05/05/2023    1:12 PM 12/20/2022    9:06 AM  BMP  Glucose 70 - 99 mg/dL 244  010  88   BUN 8 - 23 mg/dL 7  6  12    Creatinine 0.44 - 1.00 mg/dL 2.72  5.36  6.44   Sodium 135 - 145 mmol/L 137  136  141   Potassium 3.5 - 5.1 mmol/L 3.1  3.9  4.4   Chloride 98 - 111 mmol/L 107  102  112   CO2 22 - 32 mmol/L 22  12  20    Calcium 8.9 - 10.3 mg/dL 8.5  9.3  9.4     Drugs of Abuse     Component Value Date/Time   LABOPIA NONE DETECTED  05/05/2023 2018   COCAINSCRNUR NONE DETECTED 05/05/2023 2018   LABBENZ POSITIVE (A) 05/05/2023 2018   AMPHETMU NONE DETECTED 05/05/2023 2018   THCU NONE DETECTED 05/05/2023 2018   LABBARB NONE DETECTED 05/05/2023 2018     Imaging Reviewed:  CT head: No acute abnormality  Assessment: 67 year old patient with history of seizure disorder with noncompliance to antiepileptic meds, EtOH use and depression presented after having 9 seizures at home.  She has not returned to baseline after the seizures, but mental status somewhat improved.  CT head revealed no acute abnormality.  On exam today, she is still confused and postictal.  It likely will take some time for her to return to her baseline mental status.  She has been restarted on Keppra and Vimpat, but unfortunately levels were not drawn prior to restarting these medications.  Impression: Seizure activity in patient with questionable compliance to AEDs  Recommendations: -Continue LTM EEG -Continue home Keppra 500 mg twice daily and Vimpat 50 mg twice daily -Lorazepam 1 to 2 mg IV  for seizures lasting more than 3 minutes -Patient must not drive for 6 months, will discuss these restrictions with her when her mental status has returned to baseline  [name] , MSN, AGACNP-BC [facility] See Amion for schedule and pager information 05/06/2023 10:21 AM  NEUROHOSPITALIST ADDENDUM Performed a face to face diagnostic evaluation.   I have reviewed the contents of history and physical exam as documented by PA/ARNP/Resident and agree with above documentation.  I have discussed and formulated the above plan as documented. Edits to the note have been made as needed.  Impression/Key exam findings/Plan: with a lot of encouragement and coaxing, able to get her to follow basic commands. I suspect that the noted current encephalopathy is likely due to post ictal state and possibly from ativan she received.  Will keep her on LTM  for now. She does seem to be more interactive than yesterday afternoon when I saw her and thus will hold off on further workup and just let the post ictal state improve. Her CT Head was negative.    Erick Blinks, MD [facility] [phone]   If 7pm to 7am, please call on call as listed on AMION.

## 2023-05-06 NOTE — Progress Notes (Signed)
PROGRESS NOTE    [name]  VZD:[id number] DOB: May 01, 1956 DOA: 05/05/2023 PCP: Diamantina Providence, FNP    Brief Narrative:   67 y.o. female with past medical history significant for previous subarachnoid hemorrhage, alcohol withdrawal seizures, previous status epilepticus, depression, anxiety, diverticulosis, previous cholecystectomy, hysterectomy, and chronic pancreatitis, history of alcohol abuse to hospital for seizures with altered mental status.   EMS reported they did find a bottle with seizure medication at scene but family had mentioned that patient had not been taking her seizure medicine because she was out of it.  In the ED, patient had dried blood on mouth and has a small abrasion to the tip of her tongue.  Blood pressure slightly elevated.  Labs showed normal CBC with slightly elevated creatinine.  Blood alcohol level was less than 10.  TSH within normal range.  CT head scan was negative for acute abnormality but mild chronic atrophy.  Of note, patient was admitted for multiple seizures several months ago.  It appears that patient is on Keppra and Vimpat at home.  Neurology has been evaluated patient in the emergency department recommended EEG monitoring, checking Keppra and Vimpat level, Keppra load 1500 mg IV once and continue home Keppra 500 mg twice daily and Vimpat 50 mg twice daily, Ativan as needed for seizure and hospitalist team was consulted for admission to the hospital for further evaluation of admit patient for management for seizure and possible alcohol withdrawal related seizure.  Assessment and Plan:  Status epilepticus Seizure-like activity History of seizure Metabolic encephalopathy. Thought to be secondary to medication noncompliance versus alcohol withdrawal. Patient has witnessed 8-9 episodes of seizure reported by EMS.  Neurology on board and continue Keppra and Vimpat Ativan as needed.  Neurochecks.  Continue telemetry monitoring.  Neurology input.   Tylenol and salicylate level low.  Ammonia within normal range.  TSH within normal range.  CT head scan was negative for acute findings.  Urine drug screen was positive for benzodiazepines.  Keppra and Vimpat level.  Continue CIWA protocol.   Concern for alcohol withdrawal Patient has previous history of hospital admission from alcohol withdrawal and alcohol withdrawal related seizure as well.  Blood alcohol level less than 10.  Continue thiamine folic acid Ativan fall aspiration and seizure precautions.  Continue IV fluids continue CIWA protocol.  Continue seizure and aspiration precautions.   Anion gap metabolic acidosis - Bicarb 12 and anion gap 22 on presentation. Continue IV fluids Ringer lactate.  Check BMP in AM..  Hypokalemia.  Will replace.  Check levels in AM.  Potassium was 3.1 today.      DVT prophylaxis: enoxaparin (LOVENOX) injection 40 mg Start: 05/05/23 2200 SCDs Start: 05/05/23 2200   Code Status:     Code Status: Full Code  Disposition: Uncertain.  Likely home in 1 to 2 days. Status is: Inpatient Remains inpatient appropriate because: Status post seizures, alcohol withdrawal, need for further monitoring, LTM EEG   Family Communication: None at bedside  Consultants:  Neurology  Procedures:  EEG  Antimicrobials:  None  Anti-infectives (From admission, onward)    None        Subjective: Today, patient was seen and examined at bedside.  Patient appears to be little more alert awake and mildly Communicative.  States that she lives by herself.  Denies any headache nausea vomiting nausea with she is in the hospital but otherwise on restraints and occasional confusion.  Objective: Vitals:   05/06/23 1019 05/06/23 1030 05/06/23 1041 05/06/23 1045  BP:  (!) 144/93    Pulse:  72  79  Resp:  18  19  Temp: 98 F (36.7 C)     TempSrc: Axillary     SpO2:  98% 93% 99%   No intake or output data in the 24 hours ending 05/06/23 1132 There were no vitals filed  for this visit.  Physical Examination: There is no height or weight on file to calculate BMI.  General:  Average built, not in obvious distress HENT:   No scleral pallor or icterus noted. Oral mucosa is moist.  Halitosis noted, Chest:  Clear breath sounds.  No crackles or wheezes.  CVS: S1 &S2 heard. No murmur.  Regular rate and rhythm. Abdomen: Soft, nontender, nondistended.  Bowel sounds are heard.   Extremities: No cyanosis, clubbing or edema.  Peripheral pulses are palpable.  Bilateral upper extremity in restraints. Psych: Alert, awake and oriented to self and hospital, occasionally confused, CNS:  No cranial nerve deficits.  Moves extremities. Skin: Warm and dry.  No rashes noted.  Data Reviewed:   CBC: Recent Labs  Lab 05/05/23 1855 05/06/23 0141  WBC 9.6 8.6  NEUTROABS 8.2*  --   HGB 12.5 11.9*  HCT 38.1 36.4  MCV 95.5 95.8  PLT 176 157    Basic Metabolic Panel: Recent Labs  Lab 05/05/23 1312 05/06/23 0141  NA 136 137  K 3.9 3.1*  CL 102 107  CO2 12* 22  GLUCOSE 246* 118*  BUN 6* 7*  CREATININE 0.95 0.71  CALCIUM 9.3 8.5*    Liver Function Tests: Recent Labs  Lab 05/05/23 1312 05/06/23 0141  AST 54* 31  ALT 34 25  ALKPHOS 85 61  BILITOT 0.6 1.0  PROT 7.6 6.6  ALBUMIN 4.3 3.5     Radiology Studies: Overnight EEG with video  Result Date: 05/06/2023 Charlsie Quest, MD     05/06/2023  6:47 AM Patient Name: [name] MRN: [id number] Epilepsy Attending: Charlsie Quest Referring Physician/Provider: Erick Blinks, MD Duration: 05/05/2023 2149 to 05/06/2023 1829 Patient history: 67 y.o. female with PMH significant for EtOh use, seizure and noncompliant with Keppra and Vimpat, depression, who presents with apparently 9 generalized tonic-clonic seizures. EEG to evaluate for seizure. Level of alertness: Awake, asleep AEDs during EEG study: LEV, LCM Technical aspects: This EEG study was done with scalp electrodes positioned according to the  10-20 International system of electrode placement. Electrical activity was reviewed with band pass filter of 1-70Hz , sensitivity of 7 uV/mm, display speed of 59mm/sec with a 60Hz  notched filter applied as appropriate. EEG data were recorded continuously and digitally stored.  Video monitoring was available and reviewed as appropriate. Description: No clear posterior dominant rhythm was seen. Sleep was characterized by vertex waves, sleep spindles (12 to 14 Hz), maximal frontocentral region. EEG showed continuous generalized and maximal right posterior temporal 3 to 6 Hz theta-delta slowing admixed with an excessive amount of 15 to 18 Hz beta activity distributed symmetrically and diffusely. Hyperventilation and photic stimulation were not performed.   ABNORMALITY - Continuous slow, generalized and maximal right posterior temporal IMPRESSION: This study is suggestive of cortical dysfunction arising from right posterior temporal region likely secondary to underlying structural abnormality. Additionally there is Moderate diffuse encephalopathy. No seizures or epileptiform discharges were seen throughout the recording. Please note lack of epileptiform activity during interictal eeg doesn't exclude diagnosis of epilepsy. Priyanka Annabelle Harman   CT HEAD WO CONTRAST ( )  Result Date: 05/05/2023 CLINICAL DATA:  Mental  status change, persistent or worsening. Seizures. EXAM: CT HEAD WITHOUT CONTRAST TECHNIQUE: Contiguous axial images were obtained from the base of the skull through the vertex without intravenous contrast. RADIATION DOSE REDUCTION: This exam was performed according to the departmental dose-optimization program which includes automated exposure control, adjustment of the mA and/or kV according to patient size and/or use of iterative reconstruction technique. COMPARISON:  10/17/2022 FINDINGS: Brain: No evidence of acute infarction, hemorrhage, hydrocephalus, extra-axial collection or mass lesion/mass effect.  Mild diffuse cerebral atrophy. Vascular: No hyperdense vessel or unexpected calcification. Skull: Normal. Negative for fracture or focal lesion. Sinuses/Orbits: No acute finding. Other: None. IMPRESSION: No acute intracranial abnormalities.  Mild chronic atrophy. Electronically Signed   By: Burman Nieves M.D.   On: 05/05/2023 16:41      LOS: 1 day    Joycelyn Das, MD [facility] Available via Epic secure chat 7am-7pm After these hours, please refer to coverage provider listed on [email or web address] 05/06/2023, 11:32 AM

## 2023-05-06 NOTE — Progress Notes (Signed)
Patient gets confused and agitated at time to time.  Requiring Ativan as needed 3 doses and applied bilateral upper extremities soft restraint has been requested for 24-hour as concern for taking of the IV lines and also need to continue EEG monitoring.  Requested for bedside sitter for next 24 hours.

## 2023-05-06 NOTE — Progress Notes (Signed)
Maint done, no skin breakdown

## 2023-05-06 NOTE — Hospital Course (Signed)
67 y.o. female with past medical history significant for previous subarachnoid hemorrhage, alcohol withdrawal seizures, previous status epilepticus, depression, anxiety, diverticulosis, previous cholecystectomy, hysterectomy, and chronic pancreatitis, history of alcohol abuse to hospital for seizures with altered mental status.   EMS reported they did find a bottle with seizure medication at scene but family had mentioned that patient had not been taking her seizure medicine because she was out of it.  In the ED patient had dried blood on mouth and has a small abrasion to the tip of her tongue.  Blood pressure slightly elevated.  Labs showed normal CBC with slightly elevated creatinine.  Blood alcohol level was less than 10.  TSH within normal range.  CT head scan was negative for acute abnormality but mild chronic atrophy.  Of note, patient was admitted for multiple seizures several months ago.  It appears that patient is on Keppra and Vimpat at home.  Neurology has been evaluated patient in the emergency department recommended EEG monitoring, checking Keppra and Vimpat level, Keppra load 1500 mg IV once and continue home Keppra 500 mg twice daily and Vimpat 50 mg twice daily, Ativan as needed for seizure and hospitalist team was consulted for admission to the hospital for further evaluation of admit patient for management for seizure and possible alcohol withdrawal related seizure.  Assessment and Plan:  Status epilepticus Seizure-like activity History of seizure Metabolic encephalopathy. Thought to be secondary to medication noncompliance versus alcohol withdrawal. Patient has witnessed 8-9 episodes of seizure reported by EMS.  Neurology on board and continue Keppra and Vimpat Ativan as needed.  Neurochecks.  Continue telemetry monitoring.  Neurology input.  Tylenol and salicylate level low.  Ammonia within normal range.  TSH within normal range.  CT head scan was negative for acute findings.  Urine drug  screen was positive for benzodiazepines.   Concern for alcohol withdrawal Patient has previous history of hospital admission from alcohol withdrawal and alcohol withdrawal related seizure as well.  Blood alcohol level less than 10.  Continue thiamine folic acid Ativan fall aspiration and seizure precautions.  Continue IV fluids   Anion gap metabolic acidosis - Bicarb 12 and anion gap 22 on presentation. Continue IV fluids.

## 2023-05-07 DIAGNOSIS — G40901 Epilepsy, unspecified, not intractable, with status epilepticus: Secondary | ICD-10-CM | POA: Diagnosis not present

## 2023-05-07 LAB — CBC
HCT: 40.3 % (ref 36.0–46.0)
Hemoglobin: 13.6 g/dL (ref 12.0–15.0)
MCH: 31.6 pg (ref 26.0–34.0)
MCHC: 33.7 g/dL (ref 30.0–36.0)
MCV: 93.7 fL (ref 80.0–100.0)
Platelets: 135 10*3/uL — ABNORMAL LOW (ref 150–400)
RBC: 4.3 MIL/uL (ref 3.87–5.11)
RDW: 15.2 % (ref 11.5–15.5)
WBC: 6.1 10*3/uL (ref 4.0–10.5)
nRBC: 0 % (ref 0.0–0.2)

## 2023-05-07 LAB — GLUCOSE, CAPILLARY
Glucose-Capillary: 105 mg/dL — ABNORMAL HIGH (ref 70–99)
Glucose-Capillary: 114 mg/dL — ABNORMAL HIGH (ref 70–99)
Glucose-Capillary: 98 mg/dL (ref 70–99)

## 2023-05-07 LAB — BASIC METABOLIC PANEL
Anion gap: 11 (ref 5–15)
BUN: 9 mg/dL (ref 8–23)
CO2: 19 mmol/L — ABNORMAL LOW (ref 22–32)
Calcium: 8.8 mg/dL — ABNORMAL LOW (ref 8.9–10.3)
Chloride: 109 mmol/L (ref 98–111)
Creatinine, Ser: 0.7 mg/dL (ref 0.44–1.00)
GFR, Estimated: >60 mL/min (ref >60)
Glucose, Bld: 80 mg/dL (ref 70–99)
Potassium: 4.8 mmol/L (ref 3.5–5.1)
Sodium: 139 mmol/L (ref 135–145)

## 2023-05-07 LAB — MAGNESIUM: Magnesium: 2.1 mg/dL (ref 1.7–2.4)

## 2023-05-07 NOTE — Procedures (Addendum)
Patient Name: [name]  MRN: [id number]  Epilepsy Attending: Charlsie Quest  Referring Physician/Provider: Erick Blinks, MD  Duration: 05/06/2023 2149 to 05/07/2023 0049   Patient history: 67 y.o. female with PMH significant for EtOh use, seizure and noncompliant with Keppra and Vimpat, depression, who presents with apparently 9 generalized tonic-clonic seizures. EEG to evaluate for seizure.   Level of alertness: Awake, asleep   AEDs during EEG study: LEV, LCM   Technical aspects: This EEG study was done with scalp electrodes positioned according to the 10-20 International system of electrode placement. Electrical activity was reviewed with band pass filter of 1-70Hz , sensitivity of 7 uV/mm, display speed of 62mm/sec with a 60Hz  notched filter applied as appropriate. EEG data were recorded continuously and digitally stored.  Video monitoring was available and reviewed as appropriate.   Description: No clear posterior dominant rhythm was seen. Sleep was characterized by vertex waves, sleep spindles (12 to 14 Hz), maximal frontocentral region. EEG showed continuous generalized and maximal right posterior temporal 3 to 6 Hz theta-delta slowing admixed with an excessive amount of 15 to 18 Hz beta activity distributed symmetrically and diffusely. Hyperventilation and photic stimulation were not performed.   ABNORMALITY - Continuous slow, generalized and maximal right posterior temporal   IMPRESSION: This study is suggestive of cortical dysfunction arising from right posterior temporal region likely secondary to underlying structural abnormality. Additionally there is  moderate diffuse encephalopathy. No seizures or epileptiform discharges were seen throughout the recording.   Please note lack of epileptiform activity during interictal eeg doesn't exclude diagnosis of epilepsy.    Jarren Para Annabelle Harman

## 2023-05-07 NOTE — Plan of Care (Signed)

## 2023-05-07 NOTE — TOC Initial Note (Signed)
Transition of Care [facility]) - Initial/Assessment Note    Patient Details  Name: Dena Esperanza MRN: [id number] Date of Birth: Oct 01, 1956  [facility]) CM/SW Contact:    Baldemar Lenis, LCSW Phone Number: 05/07/2023, 1:07 PM  Clinical Narrative:       Patient well known to [facility] department, has already received resources for substance abuse and assistance with transportation on previous admissions. Alcohol cessation resources placed on patient's AVS and information regarding Medicaid transportation placed on patient's AVS for her to utilize to schedule transportation for her doctor's appointments. CSW to follow for any other specific needs patient may have prior to returning to the hotel with her significant other.           Expected Discharge Plan: Home/Self Care Barriers to Discharge: Continued Medical Work up, Active Substance Use - Placement   Patient Goals and CMS Choice Patient states their goals for this hospitalization and ongoing recovery are:: patient unable to participate in goal setting, not fully oriented          Expected Discharge Plan and Services     Post Acute Care Choice: NA Living arrangements for the past 2 months: Hotel/Motel                                      Prior Living Arrangements/Services Living arrangements for the past 2 months: Hotel/Motel Lives with:: Significant Other Patient language and need for interpreter reviewed:: No Do you feel safe going back to the place where you live?: Yes      Need for Family Participation in Patient Care: No (Comment) Care giver support system in place?: No (comment) Current home services: DME Criminal Activity/Legal Involvement Pertinent to Current Situation/Hospitalization: No - Comment as needed  Activities of Daily Living      Permission Sought/Granted                  Emotional Assessment   Attitude/Demeanor/Rapport: Unable to Assess Affect (typically observed):  Unable to Assess Orientation: : Oriented to Self, Oriented to Place, Oriented to Situation Alcohol / Substance Use: Alcohol Use Psych Involvement: No (comment)  Admission diagnosis:  Status epilepticus (HCC) [G40.901] Seizure (HCC) [R56.9] Patient Active Problem List   Diagnosis Date Noted   History of seizure 05/05/2023   GAD (generalized anxiety disorder) 05/05/2023   Seizure-like activity (HCC) 12/18/2022   Seizure (HCC) 12/18/2022   Hyperkalemia 10/19/2022   Acute metabolic encephalopathy 10/18/2022   Seizures (HCC) 10/17/2022   Chronic pain    Hypoalbuminemia    Hyperglycemia    Nonadherence to medication    Seizure (HCC) 05/17/2022   Status epilepticus (HCC)    Hypokalemia    Alcohol-induced mood disorder (HCC)    Closed displaced oblique fracture of shaft of left humerus 08/01/2020   GERD (gastroesophageal reflux disease) 02/28/2020   Alcohol withdrawal related seizure (HCC) 12/13/2018   Thrombocytopenia (HCC) 12/13/2018   Major depressive disorder 12/13/2018   Neuropathy 12/13/2018   Subarachnoid hemorrhage (HCC) 12/11/2018   Seizure due to alcohol withdrawal (HCC) 12/11/2018   Alcohol dependence with alcohol-induced mood disorder (HCC)    Major depressive disorder, 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 [place] [address] Phone: [phone] Fax: [phone]  [facility]  [address] [place] - [address] [name] [address] Phone: [phone] Fax: [phone]     Social Determinants of Health (SDOH) Social History: SDOH Screenings   Food Insecurity: Patient Unable To Answer (05/06/2023)  Housing: Patient Unable To Answer (05/06/2023)  Transportation Needs: Patient Unable To Answer (05/06/2023)  Utilities: Patient Unable To  Answer (05/06/2023)  Alcohol Screen: Medium Risk (12/18/2018)  Depression (PHQ2-9): Medium Risk (05/10/2021)  Financial Resource Strain: Medium Risk (09/27/2018)  Physical Activity: Unknown (09/27/2018)  Social Connections: Unknown (09/27/2018)  Stress: No Stress Concern Present (09/27/2018)  Tobacco Use: Unknown (05/05/2023)  Recent Concern: Tobacco Use - High Risk (02/09/2023)   [facility]   SDOH Interventions:     Readmission Risk Interventions     No data to display

## 2023-05-07 NOTE — Care Management Important Message (Signed)
Important Message  Patient Details  Name: [name] MRN: [id number] Date of Birth: May 12, 1956   Medicare Important Message Given:  Yes     Sherilyn Banker 05/07/2023, 3:24 PM

## 2023-05-07 NOTE — Progress Notes (Signed)
PROGRESS NOTE    [name]  ZOX:[id number] DOB: February 13, 1956 DOA: 05/05/2023 PCP: Diamantina Providence, FNP    Brief Narrative:   67 y.o. female with past medical history significant for previous subarachnoid hemorrhage, alcohol withdrawal seizures, previous status epilepticus, depression, anxiety, diverticulosis, previous cholecystectomy, hysterectomy, and chronic pancreatitis, history of alcohol abuse to hospital for seizures with altered mental status.   EMS reported they did find a bottle with seizure medication at scene but family had mentioned that patient had not been taking her seizure medicine because she was out of it.  In the ED, patient had dried blood on mouth and has a small abrasion to the tip of her tongue.  Blood pressure slightly elevated.  Labs showed normal CBC with slightly elevated creatinine.  Blood alcohol level was less than 10.  TSH within normal range.  CT head scan was negative for acute abnormality but mild chronic atrophy.  Of note, patient was admitted for multiple seizures several months ago.  It appears that patient is on Keppra and Vimpat at home.  Neurology has been evaluated patient in the emergency department recommended EEG monitoring, checking Keppra and Vimpat level, Keppra load 1500 mg IV once and continue home Keppra 500 mg twice daily and Vimpat 50 mg twice daily, Ativan as needed for seizure and hospitalist team was consulted for admission to the hospital for further evaluation of admit patient for management for seizure and possible alcohol withdrawal related seizure.  The patient was admitted to a telemetry bed. Today she is awake and alert, but somewhat lethargic and confused. No new complaints. EEG in process.  Consultants:  Neurology  Procedures:  EEG  Antimicrobials:  None  Anti-infectives (From admission, onward)    None        Subjective: Today, patient was seen and examined at bedside.  Patient appears to be little more  alert awake and mildly Communicative.  States that she lives by herself.  Denies any headache nausea vomiting nausea with she is in the hospital but otherwise on restraints and occasional confusion.  Objective: Vitals:   05/07/23 0800 05/07/23 0830 05/07/23 0930 05/07/23 1201  BP:    115/87  Pulse:  70 82   Resp:    19  Temp: 98.5 F (36.9 C)   98.5 F (36.9 C)  TempSrc:      SpO2:    97%    Intake/Output Summary (Last 24 hours) at 05/07/2023 1554 Last data filed at 05/07/2023 0326 Gross per 24 hour  Intake 1413 ml  Output --  Net 1413 ml   There were no vitals filed for this visit.  Physical Examination: There is no height or weight on file to calculate BMI.  General:  Average built, not in obvious distress HENT:   No scleral pallor or icterus noted. Oral mucosa is moist.  Halitosis noted, Chest:  Clear breath sounds.  No crackles or wheezes.  CVS: S1 &S2 heard. No murmur.  Regular rate and rhythm. Abdomen: Soft, nontender, nondistended.  Bowel sounds are heard.   Extremities: No cyanosis, clubbing or edema.  Peripheral pulses are palpable.  Bilateral upper extremity in restraints. Psych: Alert, awake and oriented to self and hospital, occasionally confused, CNS:  No cranial nerve deficits.  Moves extremities. Skin: Warm and dry.  No rashes noted.  Data Reviewed:   CBC: Recent Labs  Lab 05/05/23 1855 05/06/23 0141 05/07/23 0812  WBC 9.6 8.6 6.1  NEUTROABS 8.2*  --   --  HGB 12.5 11.9* 13.6  HCT 38.1 36.4 40.3  MCV 95.5 95.8 93.7  PLT 176 157 135*    Basic Metabolic Panel: Recent Labs  Lab 05/05/23 1312 05/06/23 0141 05/07/23 0812  NA 136 137 139  K 3.9 3.1* 4.8  CL 102 107 109  CO2 12* 22 19*  GLUCOSE 246* 118* 80  BUN 6* 7* 9  CREATININE 0.95 0.71 0.70  CALCIUM 9.3 8.5* 8.8*  MG  --   --  2.1    Liver Function Tests: Recent Labs  Lab 05/05/23 1312 05/06/23 0141  AST 54* 31  ALT 34 25  ALKPHOS 85 61  BILITOT 0.6 1.0  PROT 7.6 6.6  ALBUMIN  4.3 3.5     Radiology Studies: Overnight EEG with video  Result Date: 05/06/2023 Charlsie Quest, MD     05/07/2023 10:36 AM Patient Name: [name] MRN: [id number] Epilepsy Attending: Charlsie Quest Referring Physician/Provider: Erick Blinks, MD Duration: 05/05/2023 2149 to 05/06/2023 2149 Patient history: 67 y.o. female with PMH significant for EtOh use, seizure and noncompliant with Keppra and Vimpat, depression, who presents with apparently 9 generalized tonic-clonic seizures. EEG to evaluate for seizure. Level of alertness: Awake, asleep AEDs during EEG study: LEV, LCM Technical aspects: This EEG study was done with scalp electrodes positioned according to the 10-20 International system of electrode placement. Electrical activity was reviewed with band pass filter of 1-70Hz , sensitivity of 7 uV/mm, display speed of 63mm/sec with a 60Hz  notched filter applied as appropriate. EEG data were recorded continuously and digitally stored.  Video monitoring was available and reviewed as appropriate. Description: No clear posterior dominant rhythm was seen. Sleep was characterized by vertex waves, sleep spindles (12 to 14 Hz), maximal frontocentral region. EEG showed continuous generalized and maximal right posterior temporal 3 to 6 Hz theta-delta slowing admixed with an excessive amount of 15 to 18 Hz beta activity distributed symmetrically and diffusely. Hyperventilation and photic stimulation were not performed. Of note, parts of study were difficult to interpret due to significant electrode artifact    ABNORMALITY - Continuous slow, generalized and maximal right posterior temporal IMPRESSION: This study is suggestive of cortical dysfunction arising from right posterior temporal region likely secondary to underlying structural abnormality. Additionally there is moderate diffuse encephalopathy. No seizures or epileptiform discharges were seen throughout the recording. Please note lack of  epileptiform activity during interictal eeg doesn't exclude diagnosis of epilepsy. [name]   CT HEAD WO CONTRAST ( )  Result Date: 05/05/2023 CLINICAL DATA:  Mental status change, persistent or worsening. Seizures. EXAM: CT HEAD WITHOUT CONTRAST TECHNIQUE: Contiguous axial images were obtained from the base of the skull through the vertex without intravenous contrast. RADIATION DOSE REDUCTION: This exam was performed according to the departmental dose-optimization program which includes automated exposure control, adjustment of the mA and/or kV according to patient size and/or use of iterative reconstruction technique. COMPARISON:  10/17/2022 FINDINGS: Brain: No evidence of acute infarction, hemorrhage, hydrocephalus, extra-axial collection or mass lesion/mass effect. Mild diffuse cerebral atrophy. Vascular: No hyperdense vessel or unexpected calcification. Skull: Normal. Negative for fracture or focal lesion. Sinuses/Orbits: No acute finding. Other: None. IMPRESSION: No acute intracranial abnormalities.  Mild chronic atrophy. Electronically Signed   By: Burman Nieves M.D.   On: 05/05/2023 16:41    Assessment and Plan:  Status epilepticus Seizure-like activity History of seizure Metabolic encephalopathy. Thought to be secondary to medication noncompliance versus alcohol withdrawal. Patient has witnessed 8-9 episodes of seizure reported by EMS.  Neurology on board and continue Keppra and Vimpat Ativan as needed.  Neurochecks.  Continue telemetry monitoring.  Neurology input.  Tylenol and salicylate level low.  Ammonia within normal range.  TSH within normal range.  CT head scan was negative for acute findings.  Urine drug screen was positive for benzodiazepines.  Keppra and Vimpat level.  Continue CIWA protocol. EEG has demonstrated continuous, slow, generalized and maximal right posteroid temporal wave forms. This is suggestive of cortical dysfunction arising from the right posterior  temporal region likely secondary to underlying structural abnormality. There is also moderate diffuse encephalopathy. No seizures or epileptiform discharges were seen throughout the recording. This does not exclude the diagnosis of epilepsy.  She is complaining of pain all over due to seizures. Will make available low dose hydrocodone and check CK levels.   Concern for alcohol withdrawal Patient has previous history of hospital admission from alcohol withdrawal and alcohol withdrawal related seizure as well.  Blood alcohol level less than 10.  Continue thiamine folic acid Ativan fall aspiration and seizure precautions.  Continue IV fluids continue CIWA protocol.  Continue seizure and aspiration precautions.   Anion gap metabolic acidosis - Bicarb is improved from 12 to 19 and anion gap is now 11. It was 22 on presentation.  Check BMP in AM. Will stop IV fluids.  Hypokalemia.  Resolved. Potassium was 4.8 today. Continue to monitor.      DVT prophylaxis: enoxaparin (LOVENOX) injection 40 mg Start: 05/05/23 2200 SCDs Start: 05/05/23 2200  Code Status:     Code Status: Full Code  Disposition: Uncertain.  Likely home in 1 to 2 days. Status is: Inpatient Remains inpatient appropriate because: Status post seizures, alcohol withdrawal, need for further monitoring, LTM EEG   Family Communication: None at bedside    LOS: 2 days    Fran Lowes, MD [facility] Available via Epic secure chat 7am-7pm After these hours, please refer to coverage provider listed on [email or web address] 05/07/2023, 3:54 PM

## 2023-05-07 NOTE — Progress Notes (Signed)
Neurology Progress Note  Brief HPI: 67 year old patient with history of seizure disorder with noncompliance to medication regimen, ETOH use and depression presented after having 9 seizures at home.    Subjective: Patient is seen in her room with no family at the bedside.  Alert and oriented, states she is sore. She is able to tell me that she ran out of medications because she missed the bus to her doctors appointment and was not able to get her medications refilled. She is on medicaid and is able to set up transport through them, however she has to call to set it up. This information has been provided for her on previous admissions and will be in her discharge summary per CSW as well.    Exam: Vitals:   05/07/23 0000 05/07/23 0830  BP: 114/81   Pulse: 68 70  Resp: 14   Temp: 97.9 F (36.6 C)   SpO2:     Gen: In bed, NAD Resp: non-labored breathing, no acute distress Abd: soft, nt  Neuro: Mental Status: Patient is alert, able to state name but not location time or situation.  She is intermittently able to follow simple commands Cranial Nerves: Pupils equal round and reactive to light, extraocular movements intact, face symmetrical, hearing intact to voice, phonation normal Motor: Able to move all 4 extremities to command with good antigravity strength Sensory: Intact to light touch throughout Gait: Deferred  Pertinent Labs:    Latest Ref Rng & Units 05/07/2023    8:12 AM 05/06/2023    1:41 AM 05/05/2023    6:55 PM  CBC  WBC 4.0 - 10.5 K/uL 6.1  8.6  9.6   Hemoglobin 12.0 - 15.0 g/dL 16.1  09.6  04.5   Hematocrit 36.0 - 46.0 % 40.3  36.4  38.1   Platelets 150 - 400 K/uL 135  157  176        Latest Ref Rng & Units 05/06/2023    1:41 AM 05/05/2023    1:12 PM 12/20/2022    9:06 AM  BMP  Glucose 70 - 99 mg/dL 409  811  88   BUN 8 - 23 mg/dL 7  6  12    Creatinine 0.44 - 1.00 mg/dL 9.14  7.82  9.56   Sodium 135 - 145 mmol/L 137  136  141   Potassium 3.5 - 5.1 mmol/L 3.1  3.9  4.4    Chloride 98 - 111 mmol/L 107  102  112   CO2 22 - 32 mmol/L 22  12  20    Calcium 8.9 - 10.3 mg/dL 8.5  9.3  9.4     Drugs of Abuse     Component Value Date/Time   LABOPIA NONE DETECTED 05/05/2023 2018   COCAINSCRNUR NONE DETECTED 05/05/2023 2018   LABBENZ POSITIVE (A) 05/05/2023 2018   AMPHETMU NONE DETECTED 05/05/2023 2018   THCU NONE DETECTED 05/05/2023 2018   LABBARB NONE DETECTED 05/05/2023 2018     Imaging Reviewed:  CT head: No acute abnormality  Assessment: 67 year old patient with history of seizure disorder with noncompliance to antiepileptic meds, EtOH use and depression presented after having 9 seizures at home.  She has not returned to baseline after the seizures, but mental status somewhat improved.  CT head revealed no acute abnormality.  On exam today, she is still confused and postictal.  It likely will take some time for her to return to her baseline mental status.  She has been restarted on Keppra and Vimpat, but unfortunately  levels were not drawn prior to restarting these medications.  Impression: Seizure activity in patient with questionable compliance to AEDs  Recommendations: -Discontinue LTM EEG -Continue home Keppra 500 mg twice daily and Vimpat 50 mg twice daily -Lorazepam 1 to 2 mg IV for seizures lasting more than 3 minutes -Resources for transport provided by CSW -Patient must not drive for 6 months, will discuss these restrictions with her when her mental status has returned to baseline  Patient seen and examined by NP/APP with MD. MD to update note as needed.   Elmer Picker, DNP, FNP-BC [facility] Pager: [phone]    If 7pm to 7am, please call on call as listed on AMION.  NEUROHOSPITALIST ADDENDUM Performed a face to face diagnostic evaluation.   I have reviewed the contents of history and physical exam as documented by PA/ARNP/Resident and agree with above documentation.  I have discussed and formulated the above plan as  documented. Edits to the note have been made as needed.  Impression/Key exam findings/Plan:ran out of her seizures meds. Reports husband takes some of her meds too since he does not go to a doctor. Counseled her again on the importance of taking her seizure medications.  Will take her off LTM. Continue home Keppra and vimpat. We will signoff  Erick Blinks, MD [facility] [phone]   If 7pm to 7am, please call on call as listed on AMION.

## 2023-05-08 DIAGNOSIS — G40901 Epilepsy, unspecified, not intractable, with status epilepticus: Secondary | ICD-10-CM | POA: Diagnosis not present

## 2023-05-08 LAB — CBC WITH DIFFERENTIAL/PLATELET
Abs Immature Granulocytes: 0.02 10*3/uL (ref 0.00–0.07)
Basophils Absolute: 0 10*3/uL (ref 0.0–0.1)
Basophils Relative: 0 %
Eosinophils Absolute: 0.1 10*3/uL (ref 0.0–0.5)
Eosinophils Relative: 2 %
HCT: 40.4 % (ref 36.0–46.0)
Hemoglobin: 13.2 g/dL (ref 12.0–15.0)
Immature Granulocytes: 0 %
Lymphocytes Relative: 44 %
Lymphs Abs: 2 10*3/uL (ref 0.7–4.0)
MCH: 32 pg (ref 26.0–34.0)
MCHC: 32.7 g/dL (ref 30.0–36.0)
MCV: 97.8 fL (ref 80.0–100.0)
Monocytes Absolute: 0.5 10*3/uL (ref 0.1–1.0)
Monocytes Relative: 10 %
Neutro Abs: 2 10*3/uL (ref 1.7–7.7)
Neutrophils Relative %: 44 %
Platelets: 168 10*3/uL (ref 150–400)
RBC: 4.13 MIL/uL (ref 3.87–5.11)
RDW: 15.3 % (ref 11.5–15.5)
WBC: 4.6 10*3/uL (ref 4.0–10.5)
nRBC: 0 % (ref 0.0–0.2)

## 2023-05-08 LAB — GLUCOSE, CAPILLARY
Glucose-Capillary: 105 mg/dL — ABNORMAL HIGH (ref 70–99)
Glucose-Capillary: 76 mg/dL (ref 70–99)
Glucose-Capillary: 105 mg/dL — ABNORMAL HIGH (ref 70–99)

## 2023-05-08 LAB — BASIC METABOLIC PANEL
Anion gap: 8 (ref 5–15)
Anion gap: 12 (ref 5–15)
BUN: 10 mg/dL (ref 8–23)
BUN: 15 mg/dL (ref 8–23)
CO2: 22 mmol/L (ref 22–32)
CO2: 21 mmol/L — ABNORMAL LOW (ref 22–32)
Calcium: 8.9 mg/dL (ref 8.9–10.3)
Calcium: 9.4 mg/dL (ref 8.9–10.3)
Chloride: 109 mmol/L (ref 98–111)
Chloride: 104 mmol/L (ref 98–111)
Creatinine, Ser: 0.68 mg/dL (ref 0.44–1.00)
Creatinine, Ser: 0.81 mg/dL (ref 0.44–1.00)
GFR, Estimated: >60 mL/min (ref >60)
GFR, Estimated: >60 mL/min (ref >60)
Glucose, Bld: 93 mg/dL (ref 70–99)
Glucose, Bld: 104 mg/dL — ABNORMAL HIGH (ref 70–99)
Potassium: 2.8 mmol/L — ABNORMAL LOW (ref 3.5–5.1)
Potassium: 4 mmol/L (ref 3.5–5.1)
Sodium: 139 mmol/L (ref 135–145)
Sodium: 137 mmol/L (ref 135–145)

## 2023-05-08 LAB — MAGNESIUM: Magnesium: 1.9 mg/dL (ref 1.7–2.4)

## 2023-05-08 MED ORDER — HYDROCODONE-ACETAMINOPHEN 5-325 MG PO TABS
1.0000 | ORAL_TABLET | Freq: Four times a day (QID) | ORAL | Status: DC | PRN
  Administered 2023-05-08: 1 via ORAL
  Filled 2023-05-08: qty 1

## 2023-05-08 MED ORDER — POTASSIUM CHLORIDE CRYS ER 20 MEQ PO TBCR
40.0000 meq | EXTENDED_RELEASE_TABLET | ORAL | Status: AC
  Administered 2023-05-08 (×2): 40 meq via ORAL
  Filled 2023-05-08 (×2): qty 2

## 2023-05-08 NOTE — Progress Notes (Signed)
LTM EEG discontinued - no skin breakdown at unhook.   

## 2023-05-08 NOTE — Evaluation (Signed)
Physical Therapy Evaluation Patient Details Name: [name] MRN: [id number] DOB: May 17, 1956 Today's Date: 05/08/2023  History of Present Illness  67 y.o. female who presented 05/05/23 to hospital for seizures with altered mental status. PMH: subarachnoid hemorrhage, alcohol withdrawal seizures, previous status epilepticus, depression, anxiety, diverticulosis, previous cholecystectomy, hysterectomy, and chronic pancreatitis   Clinical Impression  Pt presents with condition above and deficits mentioned below, see PT Problem List. PTA, she was mod I holding onto furniture intermittently inside and utilizing a SPC in the community. She lives with her significant other in a motel with a level entry. Currently, pt displays deficits in activity tolerance and balance, but appears to be functioning close to her baseline. She reports being sore "everywhere", which appears to be impacting her speed with functional mobility also. At this time, she is able to ambulate in the room with intermittent UE support on furniture at a min guard-supervision level. Pt will likely progress well and quickly, thus no PT follow-up is anticipated at d/c. Will continue to follow acutely to maximize her return to baseline prior to d/c though.        Assistance Recommended at Discharge PRN  If plan is discharge home, recommend the following:  Can travel by private vehicle  Assist for transportation;Direct supervision/assist for financial management;Direct supervision/assist for medications management;Assistance with cooking/housework        Equipment Recommendations None recommended by PT  Recommendations for Other Services       Functional Status Assessment Patient has had a recent decline in their functional status and demonstrates the ability to make significant improvements in function in a reasonable and predictable amount of time.     Precautions / Restrictions Precautions Precautions:  Fall Precaution Comments: Seizure precautions, Low vision both eyes, EEG Restrictions Weight Bearing Restrictions: No      Mobility  Bed Mobility               General bed mobility comments: Sitting up in chair upon arrival    Transfers Overall transfer level: Needs assistance Equipment used: None Transfers: Sit to/from Stand Sit to Stand: Supervision           General transfer comment: Supervision for safety, slow to rise, no LOB    Ambulation/Gait Ambulation/Gait assistance: Min guard, Supervision Gait Distance (Feet): 27 Feet Assistive device: None Gait Pattern/deviations: Step-through pattern, Decreased stride length Gait velocity: reduced Gait velocity interpretation: <1.8 ft/sec, indicate of risk for recurrent falls   General Gait Details: Pt limited in distance by EEG line, but able to ambulate circles in room at a min guard-supervision level, intermittently supporting herself on furniture but at times ambulating without UE support. Mild imbalance noted but no LOB.  Stairs            Wheelchair Mobility     Tilt Bed    Modified Rankin (Stroke Patients Only)       Balance Overall balance assessment: Mild deficits observed, not formally tested                                           Pertinent Vitals/Pain Pain Assessment Pain Assessment: Faces Faces Pain Scale: Hurts little more Pain Location: all over pain - generalized Pain Descriptors / Indicators: Aching, Constant, Sore Pain Intervention(s): Limited activity within patient's tolerance, Monitored during session, Repositioned    Home Living Family/patient expects to be discharged to:: Other (  Comment) ([facility] [place] motel) Living Arrangements: Spouse/significant other Available Help at Discharge: Family;Available 24 hours/day               Additional Comments: [facility] - has lived there with [name]; both drink; level entry to [employer]; owns [facility] only     Prior Function Prior Level of Function : Independent/Modified Independent             Mobility Comments: Uses SPC outside, holds onto furniture and walls inside ADLs Comments: Per pt, independent in ADL, husband performs IADLs within the home. Husband always with pt in community setting (taking the bus, uber, etc)     Hand Dominance   Dominant Hand: Right    Extremity/Trunk Assessment   Upper Extremity Assessment Upper Extremity Assessment: Defer to OT evaluation    Lower Extremity Assessment Lower Extremity Assessment: Overall WFL for tasks assessed (denied numbness/tingling; MMT scores of 4+ to 5 grossly bil)    Cervical / Trunk Assessment Cervical / Trunk Assessment: Normal  Communication   Communication: No difficulties  Cognition Arousal/Alertness: Awake/alert Behavior During Therapy: Flat affect Overall Cognitive Status: Within Functional Limits for tasks assessed                                 General Comments: A little slow with response time although answered all questions appropriately.        General Comments General comments (skin integrity, edema, etc.): EEG leads attached to head although not running d/t software issues. Bedding weak from Purewick leaking.    Exercises     Assessment/Plan    PT Assessment Patient needs continued PT services  PT Problem List Decreased activity tolerance;Decreased balance;Decreased mobility       PT Treatment Interventions DME instruction;Gait training;Functional mobility training;Therapeutic activities;Therapeutic exercise;Balance training;Neuromuscular re-education;Cognitive remediation;Patient/family education    PT Goals (Current goals can be found in the Care Plan section)  Acute Rehab PT Goals Patient Stated Goal: to reduce pain PT Goal Formulation: With patient Time For Goal Achievement: 05/22/23 Potential to Achieve Goals: Good    Frequency Min 3X/week     Co-evaluation                AM-PAC PT "6 Clicks" Mobility  Outcome Measure Help needed turning from your back to your side while in a flat bed without using bedrails?: None Help needed moving from lying on your back to sitting on the side of a flat bed without using bedrails?: None Help needed moving to and from a bed to a chair (including a wheelchair)?: A Little Help needed standing up from a chair using your arms (e.g., wheelchair or bedside chair)?: A Little Help needed to walk in hospital room?: A Little Help needed climbing 3-5 steps with a railing? : A Little 6 Click Score: 20    End of Session   Activity Tolerance: Patient tolerated treatment well Patient left: in chair;with call bell/phone within reach;with chair alarm set   PT Visit Diagnosis: Unsteadiness on feet (R26.81);Other abnormalities of gait and mobility (R26.89);Other symptoms and signs involving the nervous system (R29.898)    Time: 6195-0932 PT Time Calculation (min) (ACUTE ONLY): 11 min   Charges:   PT Evaluation $PT Eval Moderate Complexity: 1 Mod   PT General Charges $$ ACUTE PT VISIT: 1 Visit         Raymond Gurney, PT, DPT Acute Rehabilitation Services  Office: [phone]   Darnelle Maffucci  M Pettis 05/08/2023, 1:16 PM

## 2023-05-08 NOTE — Plan of Care (Signed)

## 2023-05-08 NOTE — Progress Notes (Signed)
PROGRESS NOTE    [name]  MVH:[id number] DOB: 07-11-1956 DOA: 05/05/2023 PCP: Diamantina Providence, FNP    Brief Narrative:   67 y.o. female with past medical history significant for previous subarachnoid hemorrhage, alcohol withdrawal seizures, previous status epilepticus, depression, anxiety, diverticulosis, previous cholecystectomy, hysterectomy, and chronic pancreatitis, history of alcohol abuse to hospital for seizures with altered mental status.   EMS reported they did find a bottle with seizure medication at scene but family had mentioned that patient had not been taking her seizure medicine because she was out of it.  In the ED, patient had dried blood on mouth and has a small abrasion to the tip of her tongue.  Blood pressure slightly elevated.  Labs showed normal CBC with slightly elevated creatinine.  Blood alcohol level was less than 10.  TSH within normal range.  CT head scan was negative for acute abnormality but mild chronic atrophy.  Of note, patient was admitted for multiple seizures several months ago.  It appears that patient is on Keppra and Vimpat at home.  Neurology has been evaluated patient in the emergency department recommended EEG monitoring, checking Keppra and Vimpat level, Keppra load 1500 mg IV once and continue home Keppra 500 mg twice daily and Vimpat 50 mg twice daily, Ativan as needed for seizure and hospitalist team was consulted for admission to the hospital for further evaluation of admit patient for management for seizure and possible alcohol withdrawal related seizure.  The patient was admitted to a telemetry bed. Today she is awake and alert, but somewhat lethargic and confused. No new complaints. EEG in process.  She is complaining of "pain all over" today. Low dose hydrocodone available.  Consultants:  Neurology  Procedures:  EEG  Antimicrobials:  None  Anti-infectives (From admission, onward)    None        Subjective: Today,  patient was seen and examined at bedside.  Patient appears to be little more alert awake and communicative.  States that she lives by herself.  Denies any headache nausea vomiting nausea with she is in the hospital but otherwise on restraints and occasional confusion. She states that she hurts all over.   Objective: Vitals:   05/08/23 0604 05/08/23 0800 05/08/23 1200 05/08/23 1600  BP: 136/79 139/76 121/83   Pulse: 63 64  82  Resp: 13 15 18    Temp: 98 F (36.7 C) 98.4 F (36.9 C) 98.2 F (36.8 C)   TempSrc:  Oral Oral   SpO2: 92%       Intake/Output Summary (Last 24 hours) at 05/08/2023 1611 Last data filed at 05/08/2023 8413 Gross per 24 hour  Intake --  Output 0 ml  Net 0 ml   There were no vitals filed for this visit.  Physical Examination: There is no height or weight on file to calculate BMI.  General:  Average built, not in obvious distress HENT:   No scleral pallor or icterus noted. Oral mucosa is moist.  Halitosis noted, Chest:  Clear breath sounds.  No crackles or wheezes.  CVS: S1 &S2 heard. No murmur.  Regular rate and rhythm. Abdomen: Soft, nontender, nondistended.  Bowel sounds are heard.   Extremities: No cyanosis, clubbing or edema.  Peripheral pulses are palpable.  Bilateral upper extremity in restraints. Psych: Alert, awake and oriented to self and hospital, occasionally confused, CNS:  No cranial nerve deficits.  Moves extremities. Skin: Warm and dry.  No rashes noted.  Data Reviewed:   CBC: Recent Labs  Lab 05/05/23 1855 05/06/23 0141 05/07/23 0812 05/08/23 0255  WBC 9.6 8.6 6.1 4.6  NEUTROABS 8.2*  --   --  2.0  HGB 12.5 11.9* 13.6 13.2  HCT 38.1 36.4 40.3 40.4  MCV 95.5 95.8 93.7 97.8  PLT 176 157 135* 168    Basic Metabolic Panel: Recent Labs  Lab 05/05/23 1312 05/06/23 0141 05/07/23 0812 05/08/23 0255  NA 136 137 139 139  K 3.9 3.1* 4.8 2.8*  CL 102 107 109 109  CO2 12* 22 19* 22  GLUCOSE 246* 118* 80 93  BUN 6* 7* 9 10   CREATININE 0.95 0.71 0.70 0.68  CALCIUM 9.3 8.5* 8.8* 8.9  MG  --   --  2.1 1.9    Liver Function Tests: Recent Labs  Lab 05/05/23 1312 05/06/23 0141  AST 54* 31  ALT 34 25  ALKPHOS 85 61  BILITOT 0.6 1.0  PROT 7.6 6.6  ALBUMIN 4.3 3.5     Radiology Studies: No results found.  Assessment and Plan:  Status epilepticus Seizure-like activity History of seizure Metabolic encephalopathy. Thought to be secondary to medication noncompliance versus alcohol withdrawal. Patient has witnessed 8-9 episodes of seizure reported by EMS.  Neurology on board and continue Keppra and Vimpat Ativan as needed.  Neurochecks.  Continue telemetry monitoring.  Neurology input.  Tylenol and salicylate level low.  Ammonia within normal range.  TSH within normal range.  CT head scan was negative for acute findings.  Urine drug screen was positive for benzodiazepines.  Keppra and Vimpat level.  Continue CIWA protocol. EEG has demonstrated continuous, slow, generalized and maximal right posteroid temporal wave forms. This is suggestive of cortical dysfunction arising from the right posterior temporal region likely secondary to underlying structural abnormality. There is also moderate diffuse encephalopathy. No seizures or epileptiform discharges were seen throughout the recording. This does not exclude the diagnosis of epilepsy.  She is complaining of pain all over due to seizures. Will make available low dose hydrocodone and check CK levels.   Concern for alcohol withdrawal Patient has previous history of hospital admission from alcohol withdrawal and alcohol withdrawal related seizure as well.  Blood alcohol level less than 10.  Continue thiamine folic acid Ativan fall aspiration and seizure precautions.  Continue IV fluids continue CIWA protocol.  Continue seizure and aspiration precautions.   Anion gap metabolic acidosis - Bicarb is improved from 12 to 19 and anion gap is now 11. It was 22 on  presentation.  Check BMP in AM. Will stop IV fluids.  Hypokalemia.  Resolved. Potassium was 4.8 today. Continue to monitor.      DVT prophylaxis: enoxaparin (LOVENOX) injection 40 mg Start: 05/05/23 2200 SCDs Start: 05/05/23 2200  Code Status:     Code Status: Full Code  Disposition: Uncertain.  Likely home in 1 to 2 days. Status is: Inpatient Remains inpatient appropriate because: Status post seizures, alcohol withdrawal, need for further monitoring, LTM EEG   Family Communication: None at bedside    LOS: 3 days    Renaldo Gornick, MD [facility] Available via Epic secure chat 7am-7pm After these hours, please refer to coverage provider listed on [email or web address] 05/08/2023, 4:11 PM

## 2023-05-08 NOTE — Evaluation (Signed)
Occupational Therapy Evaluation Patient Details Name: Taelyr Jantz MRN: [id number] DOB: Jul 20, 1956 Today's Date: 05/08/2023   History of Present Illness 67 y.o. female with past medical history significant for previous subarachnoid hemorrhage, alcohol withdrawal seizures, previous status epilepticus, depression, anxiety, diverticulosis, previous cholecystectomy, hysterectomy, and chronic pancreatitis, history of alcohol abuse to hospital for seizures with altered mental status.   Clinical Impression   Patient evaluated by Occupational Therapy with no further acute OT needs identified. All education has been completed and the patient has no further questions. Pt reports that she lives with her husband at [facility] and is typically Mod I for BADL tasks. She uses a SPC in the community and holds walls/furniture at home. Husband provides assistance as needed d/t to patient's visual deficits. Pt appears to be at or very close to her baseline. I do not recommend any follow up Occupational Therapy or equipment needs. OT is signing off. Thank you for this referral.       Recommendations for follow up therapy are one component of a multi-disciplinary discharge planning process, led by the attending physician.  Recommendations may be updated based on patient status, additional functional criteria and insurance authorization.   Assistance Recommended at Discharge Set up Supervision/Assistance  Patient can return home with the following A little help with walking and/or transfers;A little help with bathing/dressing/bathroom;Help with stairs or ramp for entrance;Assist for transportation    Functional Status Assessment  Patient has had a recent decline in their functional status and demonstrates the ability to make significant improvements in function in a reasonable and predictable amount of time.  Equipment Recommendations  None recommended by OT       Precautions / Restrictions  Precautions Precautions: Fall Precaution Comments: Seizure precautions, Low vision both eyes Restrictions Weight Bearing Restrictions: No      Mobility Bed Mobility Overal bed mobility: Modified Independent    General bed mobility comments: HOB flat, no bed rail. Completed with no physical assist.    Transfers Overall transfer level: Needs assistance Equipment used: 1 person hand held assist Transfers: Sit to/from Stand, Bed to chair/wheelchair/BSC Sit to Stand: Min guard     Step pivot transfers: Min guard            Balance Overall balance assessment: Mild deficits observed, not formally tested          ADL either performed or assessed with clinical judgement   ADL Overall ADL's : At baseline       General ADL Comments: Provided set up for LB bathing. Min A provide for UB dressing d/t visual deficits. SBA during grooming while standing at sink to wash hands. VC provided for locating items during ADL task d/t visual deficits. Toilet transfer: min guard, regular toilet, grab bar, ambulating. LB dressing: SBA while seated EOB to don socks.     Vision Baseline Vision/History: 1 Wears glasses;4 Cataracts (Left eye blindness - pt reports that she is supposed to have surgery on her left eye, wears contacts - reports that she stopped wearing her contact because she can't see with it in either.) Ability to See in Adequate Light: 2 Moderately impaired Patient Visual Report: No change from baseline              Pertinent Vitals/Pain Pain Assessment Pain Assessment: 0-10 Pain Score: 10-Worst pain ever Pain Location: all over pain - generalized Pain Descriptors / Indicators: Aching, Constant, Sore Pain Intervention(s): Monitored during session     Hand Dominance Right  Extremity/Trunk Assessment Upper Extremity Assessment Upper Extremity Assessment: Overall WFL for tasks assessed   Lower Extremity Assessment Lower Extremity Assessment: Overall WFL for tasks  assessed   Cervical / Trunk Assessment Cervical / Trunk Assessment: Normal   Communication Communication Communication: No difficulties   Cognition Arousal/Alertness: Awake/alert Behavior During Therapy: Flat affect Overall Cognitive Status: Within Functional Limits for tasks assessed       General Comments: A little slow with response time although answered all questions appropriately.     General Comments  EEG leads attached to head although not running d/t software issues. Bedding weak from Purewick leaking.            Home Living Family/patient expects to be discharged to:: Other (Comment) [facility] - motel) Living Arrangements: Spouse/significant other Available Help at Discharge: Family;Available 24 hours/day          Prior Functioning/Environment Prior Level of Function : Independent/Modified Independent      Mobility Comments: Uses SPC outside, holds onto furniture and walls inside ADLs Comments: Per pt, independent in ADL, husband performs IADLs within the home. Husband always with pt in community setting (taking the bus, uber, Catering manager)        OT Problem List: Impaired balance (sitting and/or standing)         OT Goals(Current goals can be found in the care plan section) Acute Rehab OT Goals Patient Stated Goal: to use the bathroom  OT Frequency:  1X visit       AM-PAC OT "6 Clicks" Daily Activity     Outcome Measure Help from another person eating meals?: A Little Help from another person taking care of personal grooming?: A Little Help from another person toileting, which includes using toliet, bedpan, or urinal?: A Little Help from another person bathing (including washing, rinsing, drying)?: A Little Help from another person to put on and taking off regular upper body clothing?: A Little Help from another person to put on and taking off regular lower body clothing?: A Little 6 Click Score: 18   End of Session Equipment Utilized During  Treatment: Gait belt Nurse Communication: Mobility status  Activity Tolerance: Patient tolerated treatment well Patient left: in chair;with call bell/phone within reach;with chair alarm set  OT Visit Diagnosis: Unsteadiness on feet (R26.81)                Time: 1610-9604 OT Time Calculation (min): 69 min Charges:  OT General Charges $OT Visit: 1 Visit OT Evaluation $OT Eval Moderate Complexity: 1 Mod OT Treatments $Self Care/Home Management : 23-37 mins $Therapeutic Activity: 8-22 mins  Limmie Patricia, OTR/L,CBIS  Supplemental OT - [facility] and [facility] Secure Chat Preferred    Vallorie Niccoli, Charisse March 05/08/2023, 12:29 PM

## 2023-05-09 DIAGNOSIS — G40901 Epilepsy, unspecified, not intractable, with status epilepticus: Secondary | ICD-10-CM | POA: Diagnosis not present

## 2023-05-09 LAB — CBC WITH DIFFERENTIAL/PLATELET
Abs Immature Granulocytes: 0.01 10*3/uL (ref 0.00–0.07)
Basophils Absolute: 0 10*3/uL (ref 0.0–0.1)
Basophils Relative: 0 %
Eosinophils Absolute: 0.1 10*3/uL (ref 0.0–0.5)
Eosinophils Relative: 2 %
HCT: 38.1 % (ref 36.0–46.0)
Hemoglobin: 12.3 g/dL (ref 12.0–15.0)
Immature Granulocytes: 0 %
Lymphocytes Relative: 44 %
Lymphs Abs: 2.1 10*3/uL (ref 0.7–4.0)
MCH: 32.2 pg (ref 26.0–34.0)
MCHC: 32.3 g/dL (ref 30.0–36.0)
MCV: 99.7 fL (ref 80.0–100.0)
Monocytes Absolute: 0.4 10*3/uL (ref 0.1–1.0)
Monocytes Relative: 9 %
Neutro Abs: 2.1 10*3/uL (ref 1.7–7.7)
Neutrophils Relative %: 45 %
Platelets: 190 10*3/uL (ref 150–400)
RBC: 3.82 MIL/uL — ABNORMAL LOW (ref 3.87–5.11)
RDW: 15.2 % (ref 11.5–15.5)
WBC: 4.7 10*3/uL (ref 4.0–10.5)
nRBC: 0 % (ref 0.0–0.2)

## 2023-05-09 LAB — BASIC METABOLIC PANEL
Anion gap: 5 (ref 5–15)
BUN: 11 mg/dL (ref 8–23)
CO2: 20 mmol/L — ABNORMAL LOW (ref 22–32)
Calcium: 8.7 mg/dL — ABNORMAL LOW (ref 8.9–10.3)
Chloride: 111 mmol/L (ref 98–111)
Creatinine, Ser: 0.56 mg/dL (ref 0.44–1.00)
GFR, Estimated: >60 mL/min (ref >60)
Glucose, Bld: 101 mg/dL — ABNORMAL HIGH (ref 70–99)
Potassium: 3.9 mmol/L (ref 3.5–5.1)
Sodium: 136 mmol/L (ref 135–145)

## 2023-05-09 LAB — GLUCOSE, CAPILLARY: Glucose-Capillary: 88 mg/dL (ref 70–99)

## 2023-05-09 MED ORDER — ADULT MULTIVITAMIN W/MINERALS CH
1.0000 | ORAL_TABLET | Freq: Every day | ORAL | 0 refills | Status: DC
  Filled 2023-05-09: qty 30, 30d supply, fill #0

## 2023-05-09 MED ORDER — LACOSAMIDE 50 MG PO TABS: 50.0000 mg | ORAL_TABLET | Freq: Two times a day (BID) | ORAL | 0 refills | Status: DC

## 2023-05-09 MED ORDER — VITAMIN B-1 100 MG PO TABS: 100.0000 mg | ORAL_TABLET | Freq: Every day | ORAL | 0 refills | Status: DC

## 2023-05-09 MED ORDER — LEVETIRACETAM 500 MG PO TABS: 500.0000 mg | ORAL_TABLET | Freq: Two times a day (BID) | ORAL | 0 refills | Status: DC

## 2023-05-09 MED ORDER — ADULT MULTIVITAMIN W/MINERALS CH: 1.0000 | ORAL_TABLET | Freq: Every day | ORAL | 0 refills | Status: DC

## 2023-05-09 MED ORDER — VITAMIN B-1 100 MG PO TABS
100.0000 mg | ORAL_TABLET | Freq: Every day | ORAL | 0 refills | Status: DC
  Filled 2023-05-09: qty 30, 30d supply, fill #0

## 2023-05-09 MED ORDER — LEVETIRACETAM 500 MG PO TABS
500.0000 mg | ORAL_TABLET | Freq: Two times a day (BID) | ORAL | 0 refills | Status: DC
  Filled 2023-05-09: qty 60, 30d supply, fill #0

## 2023-05-09 NOTE — Discharge Summary (Addendum)
Physician Discharge Summary   Patient: [name] MRN: [id number] DOB: 1956/04/29  Admit date:     05/05/2023  Discharge date: 05/09/23  Discharge Physician: Fran Lowes   PCP: Diamantina Providence, FNP   Recommendations at discharge:    Discharge to home Pt should seek assistance with cessation of alcohol use. Follow up with PCP in 7-10 days. No driving for 6 months. Follow up with [facility] in 6-8 weeks. Call for an appointment.  Discharge Diagnoses: Principal Problem:   Status epilepticus (HCC) Active Problems:   Acute metabolic encephalopathy   Seizure-like activity (HCC)   Alcohol withdrawal related seizure (HCC)   Major depressive disorder   History of seizure   GAD (generalized anxiety disorder)  Resolved Problems:   * No resolved hospital problems. *  Hospital Course: 67 y.o. female with past medical history significant for previous subarachnoid hemorrhage, alcohol withdrawal seizures, previous status epilepticus, depression, anxiety, diverticulosis, previous cholecystectomy, hysterectomy, and chronic pancreatitis, history of alcohol abuse to hospital for seizures with altered mental status.   EMS reported they did find a bottle with seizure medication at scene but family had mentioned that patient had not been taking her seizure medicine because she was out of it.  In the ED patient had dried blood on mouth and has a small abrasion to the tip of her tongue.  Blood pressure slightly elevated.  Labs showed normal CBC with slightly elevated creatinine.  Blood alcohol level was less than 10.  TSH within normal range.  CT head scan was negative for acute abnormality but mild chronic atrophy.  Of note, patient was admitted for multiple seizures several months ago.  It appears that patient is on Keppra and Vimpat at home.  Neurology has been evaluated patient in the emergency department recommended EEG monitoring, checking Keppra and Vimpat level, Keppra  load 1500 mg IV once and continue home Keppra 500 mg twice daily and Vimpat 50 mg twice daily, Ativan as needed for seizure and hospitalist team was consulted for admission to the hospital for further evaluation of admit patient for management for seizure and possible alcohol withdrawal related seizure.  Assessment and Plan:  Status epilepticus Seizure-like activity History of seizure Metabolic encephalopathy. Thought to be secondary to medication noncompliance versus alcohol withdrawal. Patient has witnessed 8-9 episodes of seizure reported by EMS.  Neurology on board and continue Keppra and Vimpat Ativan as needed.  Neurochecks.  Continue telemetry monitoring.  Neurology input.  Tylenol and salicylate level low.  Ammonia within normal range.  TSH within normal range.  CT head scan was negative for acute findings.  Urine drug screen was positive for benzodiazepines. Await PT/OT eval for disposition and DME recommendations. She has been advised not to drive for 6 months.   Concern for alcohol withdrawal Patient has previous history of hospital admission from alcohol withdrawal and alcohol withdrawal related seizure as well.  Blood alcohol level less than 10.  Continue thiamine folic acid Ativan fall aspiration and seizure precautions.  The patient has been strongly advised to cease alcohol use and to seek help as outpatient to remain sober.   Anion gap metabolic acidosis - Bicarb 12 and anion gap 22 on presentation. -Resolved. Continue IV fluids.  Medical Non-compliance The patient is very strongly advised to take her anti-seizure medication as prescribed and to seek refill from neurology or PCP immediately when she sees that she is down to 8 or fewer of her Vimpat or Keppra. She is also to  keep her follow up appointments with neurology and PCP.       Consultants: neurology Procedures performed: EEG  Disposition: Home Diet recommendation:  Discharge Diet Orders (From admission, onward)      Start     Ordered   05/09/23 0000  Diet - low sodium heart healthy        05/09/23 1318           Cardiac diet DISCHARGE MEDICATION: Allergies as of 05/09/2023       Reactions   Keflex [cephalexin] Hives   Cephalosporins Hives   Prednisone Swelling   Toradol [ketorolac Tromethamine] Hives   Ultram [tramadol] Hives        Medication List     STOP taking these medications    ibuprofen 200 MG tablet Commonly known as: ADVIL   naltrexone 50 MG tablet Commonly known as: DEPADE   oxyCODONE 5 MG immediate release tablet Commonly known as: Oxy IR/ROXICODONE   thiamine 100 MG tablet Commonly known as: VITAMIN B1       TAKE these medications    acetaminophen 500 MG tablet Commonly known as: TYLENOL Take 500 mg by mouth every 6 (six) hours as needed for moderate pain.   folic acid 1 MG tablet Commonly known as: FOLVITE Take 1 tablet (1 mg total) by mouth daily.   gabapentin 300 MG capsule Commonly known as: NEURONTIN Take 1 capsule (300 mg total) by mouth 2 (two) times daily.   lacosamide 50 MG Tabs tablet Commonly known as: VIMPAT Take 1 tablet (50 mg total) by mouth 2 (two) times daily.   levETIRAcetam 500 MG tablet Commonly known as: KEPPRA Take 1 tablet (500 mg total) by mouth 2 (two) times daily.   loratadine 10 MG tablet Commonly known as: CLARITIN Take 1 tablet (10 mg total) by mouth daily.   multivitamin with minerals Tabs tablet Take 1 tablet by mouth daily. Start taking on: May 10, 2023   pantoprazole 40 MG tablet Commonly known as: Protonix Take 1 tablet (40 mg total) by mouth daily.   polyethylene glycol powder 17 GM/SCOOP powder Commonly known as: GLYCOLAX/MIRALAX Take 1 capful (17 g) by mouth daily.   thiamine 100 MG tablet Commonly known as: Vitamin B-1 Take 1 tablet (100 mg total) by mouth daily. Start taking on: May 10, 2023        Follow-up Information     Medicaid Transportation. Call.   Why: Please call  Medicaid Transportation to schedule transportation for doctor's appointments. You must schedule 2 days in advance. Contact information: [phone]               Discharge Exam: Filed Weights   05/09/23 0300  Weight: 63.6 kg   Exam:  Constitutional:  The patient is awake, alert, and oriented x 3. No acute distress. Respiratory:  No increased work of breathing. No wheezes, rales, or rhonchi No tactile fremitus Cardiovascular:  Regular rate and rhythm No murmurs, ectopy, or gallups. No lateral PMI. No thrills. Abdomen:  Abdomen is soft, non-tender, non-distended No hernias, masses, or organomegaly Normoactive bowel sounds.  Musculoskeletal:  No cyanosis, clubbing, or edema Skin:  No rashes, lesions, ulcers palpation of skin: no induration or nodules Neurologic:  CN 2-12 intact Sensation all 4 extremities intact Psychiatric:  Mental status Mood, affect appropriate Orientation to person, place, time  judgment and insight appear intact   Condition at discharge: fair  The results of significant diagnostics from this hospitalization (including imaging, microbiology, ancillary and laboratory) are  listed below for reference.   Imaging Studies: Overnight EEG with video  Result Date: 05/06/2023 Charlsie Quest, MD     05/07/2023 10:36 AM Patient Name: Maurisha Mongeau MRN: [id number] Epilepsy Attending: Charlsie Quest Referring Physician/Provider: Erick Blinks, MD Duration: 05/05/2023 2149 to 05/06/2023 2149 Patient history: 67 y.o. female with PMH significant for EtOh use, seizure and noncompliant with Keppra and Vimpat, depression, who presents with apparently 9 generalized tonic-clonic seizures. EEG to evaluate for seizure. Level of alertness: Awake, asleep AEDs during EEG study: LEV, LCM Technical aspects: This EEG study was done with scalp electrodes positioned according to the 10-20 International system of electrode placement. Electrical activity was  reviewed with band pass filter of 1-70Hz , sensitivity of 7 uV/mm, display speed of 6mm/sec with a 60Hz  notched filter applied as appropriate. EEG data were recorded continuously and digitally stored.  Video monitoring was available and reviewed as appropriate. Description: No clear posterior dominant rhythm was seen. Sleep was characterized by vertex waves, sleep spindles (12 to 14 Hz), maximal frontocentral region. EEG showed continuous generalized and maximal right posterior temporal 3 to 6 Hz theta-delta slowing admixed with an excessive amount of 15 to 18 Hz beta activity distributed symmetrically and diffusely. Hyperventilation and photic stimulation were not performed. Of note, parts of study were difficult to interpret due to significant electrode artifact    ABNORMALITY - Continuous slow, generalized and maximal right posterior temporal IMPRESSION: This study is suggestive of cortical dysfunction arising from right posterior temporal region likely secondary to underlying structural abnormality. Additionally there is moderate diffuse encephalopathy. No seizures or epileptiform discharges were seen throughout the recording. Please note lack of epileptiform activity during interictal eeg doesn't exclude diagnosis of epilepsy. Priyanka Annabelle Harman   CT HEAD WO CONTRAST ( )  Result Date: 05/05/2023 CLINICAL DATA:  Mental status change, persistent or worsening. Seizures. EXAM: CT HEAD WITHOUT CONTRAST TECHNIQUE: Contiguous axial images were obtained from the base of the skull through the vertex without intravenous contrast. RADIATION DOSE REDUCTION: This exam was performed according to the departmental dose-optimization program which includes automated exposure control, adjustment of the mA and/or kV according to patient size and/or use of iterative reconstruction technique. COMPARISON:  10/17/2022 FINDINGS: Brain: No evidence of acute infarction, hemorrhage, hydrocephalus, extra-axial collection or mass  lesion/mass effect. Mild diffuse cerebral atrophy. Vascular: No hyperdense vessel or unexpected calcification. Skull: Normal. Negative for fracture or focal lesion. Sinuses/Orbits: No acute finding. Other: None. IMPRESSION: No acute intracranial abnormalities.  Mild chronic atrophy. Electronically Signed   By: Burman Nieves M.D.   On: 05/05/2023 16:41    Microbiology: Results for orders placed or performed during the hospital encounter of 05/17/22  Resp Panel by RT-PCR (Flu A&B, Covid) Anterior Nasal Swab     Status: None   Collection Time: 05/17/22  3:24 PM   Specimen: Anterior Nasal Swab  Result Value Ref Range Status   SARS Coronavirus 2 by RT PCR NEGATIVE NEGATIVE Final    Comment: (NOTE) SARS-CoV-2 target nucleic acids are NOT DETECTED.  The SARS-CoV-2 RNA is generally detectable in upper respiratory specimens during the acute phase of infection. The lowest concentration of SARS-CoV-2 viral copies this assay can detect is 138 copies/mL. A negative result does not preclude SARS-Cov-2 infection and should not be used as the sole basis for treatment or other patient management decisions. A negative result may occur with  improper specimen collection/handling, submission of specimen other than nasopharyngeal swab, presence of viral mutation(s) within the areas targeted by  this assay, and inadequate number of viral copies(<138 copies/mL). A negative result must be combined with clinical observations, patient history, and epidemiological information. The expected result is Negative.  Fact Sheet for Patients:  BloggerCourse.com  Fact Sheet for Healthcare Providers:  SeriousBroker.it  This test is no t yet approved or cleared by the [place] FDA and  has been authorized for detection and/or diagnosis of SARS-CoV-2 by FDA under an Emergency Use Authorization (EUA). This EUA will remain  in effect (meaning this test can be used)  for the duration of the COVID-19 declaration under Section 564(b)(1) of the Act, 21 U.S.C.section 360bbb-3(b)(1), unless the authorization is terminated  or revoked sooner.       Influenza A by PCR NEGATIVE NEGATIVE Final   Influenza B by PCR NEGATIVE NEGATIVE Final    Comment: (NOTE) The Xpert Xpress SARS-CoV-2/FLU/RSV plus assay is intended as an aid in the diagnosis of influenza from Nasopharyngeal swab specimens and should not be used as a sole basis for treatment. Nasal washings and aspirates are unacceptable for Xpert Xpress SARS-CoV-2/FLU/RSV testing.  Fact Sheet for Patients: BloggerCourse.com  Fact Sheet for Healthcare Providers: SeriousBroker.it  This test is not yet approved or cleared by the [place] FDA and has been authorized for detection and/or diagnosis of SARS-CoV-2 by FDA under an Emergency Use Authorization (EUA). This EUA will remain in effect (meaning this test can be used) for the duration of the COVID-19 declaration under Section 564(b)(1) of the Act, 21 U.S.C. section 360bbb-3(b)(1), unless the authorization is terminated or revoked.  Performed at [facility], [address] [place], [place] [address]     Labs: CBC: Recent Labs  Lab 05/05/23 1855 05/06/23 0141 05/07/23 0812 05/08/23 0255 05/09/23 0447  WBC 9.6 8.6 6.1 4.6 4.7  NEUTROABS 8.2*  --   --  2.0 2.1  HGB 12.5 11.9* 13.6 13.2 12.3  HCT 38.1 36.4 40.3 40.4 38.1  MCV 95.5 95.8 93.7 97.8 99.7  PLT 176 157 135* 168 190   Basic Metabolic Panel: Recent Labs  Lab 05/06/23 0141 05/07/23 0812 05/08/23 0255 05/08/23 1803 05/09/23 0447  NA 137 139 139 137 136  K 3.1* 4.8 2.8* 4.0 3.9  CL 107 109 109 104 111  CO2 22 19* 22 21* 20*  GLUCOSE 118* 80 93 104* 101*  BUN 7* 9 10 15 11   CREATININE 0.71 0.70 0.68 0.81 0.56  CALCIUM 8.5* 8.8* 8.9 9.4 8.7*  MG  --  2.1 1.9  --   --    Liver Function Tests: Recent Labs  Lab  05/05/23 1312 05/06/23 0141  AST 54* 31  ALT 34 25  ALKPHOS 85 61  BILITOT 0.6 1.0  PROT 7.6 6.6  ALBUMIN 4.3 3.5   CBG: Recent Labs  Lab 05/07/23 1616 05/08/23 0129 05/08/23 0605 05/08/23 0827 05/09/23 0140  GLUCAP 105* 105* 76 105* 88    Discharge time spent: greater than 30 minutes.  Signed: Magenta Schmiesing, DO [facility] 05/09/2023

## 2023-05-09 NOTE — Plan of Care (Signed)

## 2023-05-10 ENCOUNTER — Other Ambulatory Visit (HOSPITAL_COMMUNITY): Payer: Self-pay

## 2023-05-31 ENCOUNTER — Ambulatory Visit: Payer: 59 | Admitting: Family Medicine

## 2023-07-12 ENCOUNTER — Encounter: Payer: Self-pay | Admitting: Family Medicine

## 2023-08-09 ENCOUNTER — Ambulatory Visit: Payer: 59 | Admitting: Family Medicine

## 2023-08-18 ENCOUNTER — Observation Stay (HOSPITAL_COMMUNITY)
Admission: EM | Admit: 2023-08-18 | Discharge: 2023-08-19 | Disposition: A | Discharge status: Home / Self Care | Payer: 59 | Attending: Internal Medicine | Admitting: Internal Medicine

## 2023-08-18 ENCOUNTER — Observation Stay (HOSPITAL_COMMUNITY): Payer: 59

## 2023-08-18 ENCOUNTER — Emergency Department (HOSPITAL_COMMUNITY): Payer: 59

## 2023-08-18 ENCOUNTER — Other Ambulatory Visit: Payer: Self-pay

## 2023-08-18 ENCOUNTER — Encounter (HOSPITAL_COMMUNITY): Payer: Self-pay | Admitting: Student

## 2023-08-18 DIAGNOSIS — R2681 Unsteadiness on feet: Secondary | ICD-10-CM | POA: Diagnosis not present

## 2023-08-18 DIAGNOSIS — F1020 Alcohol dependence, uncomplicated: Secondary | ICD-10-CM | POA: Diagnosis present

## 2023-08-18 DIAGNOSIS — E872 Acidosis, unspecified: Secondary | ICD-10-CM | POA: Insufficient documentation

## 2023-08-18 DIAGNOSIS — Z79899 Other long term (current) drug therapy: Secondary | ICD-10-CM | POA: Diagnosis not present

## 2023-08-18 DIAGNOSIS — J690 Pneumonitis due to inhalation of food and vomit: Secondary | ICD-10-CM | POA: Diagnosis not present

## 2023-08-18 DIAGNOSIS — Z87898 Personal history of other specified conditions: Secondary | ICD-10-CM

## 2023-08-18 DIAGNOSIS — G40909 Epilepsy, unspecified, not intractable, without status epilepticus: Secondary | ICD-10-CM | POA: Diagnosis present

## 2023-08-18 DIAGNOSIS — R410 Disorientation, unspecified: Secondary | ICD-10-CM | POA: Diagnosis not present

## 2023-08-18 DIAGNOSIS — E876 Hypokalemia: Secondary | ICD-10-CM | POA: Diagnosis present

## 2023-08-18 DIAGNOSIS — Z91148 Patient's other noncompliance with medication regimen for other reason: Secondary | ICD-10-CM | POA: Diagnosis not present

## 2023-08-18 DIAGNOSIS — Z91198 Patient's noncompliance with other medical treatment and regimen for other reason: Secondary | ICD-10-CM | POA: Diagnosis not present

## 2023-08-18 DIAGNOSIS — Z6826 Body mass index (BMI) 26.0-26.9, adult: Secondary | ICD-10-CM | POA: Insufficient documentation

## 2023-08-18 DIAGNOSIS — E669 Obesity, unspecified: Secondary | ICD-10-CM | POA: Insufficient documentation

## 2023-08-18 DIAGNOSIS — R569 Unspecified convulsions: Principal | ICD-10-CM

## 2023-08-18 DIAGNOSIS — F411 Generalized anxiety disorder: Secondary | ICD-10-CM | POA: Diagnosis present

## 2023-08-18 LAB — URINALYSIS, ROUTINE W REFLEX MICROSCOPIC
Bilirubin Urine: NEGATIVE
Glucose, UA: NEGATIVE mg/dL
Hgb urine dipstick: NEGATIVE
Ketones, ur: NEGATIVE mg/dL
Leukocytes,Ua: NEGATIVE
Nitrite: NEGATIVE
Protein, ur: NEGATIVE mg/dL
Specific Gravity, Urine: 1.008 (ref 1.005–1.030)
pH: 7 (ref 5.0–8.0)

## 2023-08-18 LAB — COMPREHENSIVE METABOLIC PANEL
ALT: 23 U/L (ref 0–44)
AST: 40 U/L (ref 15–41)
Albumin: 3.5 g/dL (ref 3.5–5.0)
Alkaline Phosphatase: 57 U/L (ref 38–126)
Anion gap: 17 — ABNORMAL HIGH (ref 5–15)
BUN: <5 mg/dL — ABNORMAL LOW (ref 8–23)
CO2: 17 mmol/L — ABNORMAL LOW (ref 22–32)
Calcium: 8.5 mg/dL — ABNORMAL LOW (ref 8.9–10.3)
Chloride: 102 mmol/L (ref 98–111)
Creatinine, Ser: 0.77 mg/dL (ref 0.44–1.00)
GFR, Estimated: >60 mL/min (ref >60)
Glucose, Bld: 174 mg/dL — ABNORMAL HIGH (ref 70–99)
Potassium: 2.5 mmol/L — CL (ref 3.5–5.1)
Sodium: 136 mmol/L (ref 135–145)
Total Bilirubin: 0.7 mg/dL (ref 0.3–1.2)
Total Protein: 6.2 g/dL — ABNORMAL LOW (ref 6.5–8.1)

## 2023-08-18 LAB — ETHANOL: Alcohol, Ethyl (B): <10 mg/dL (ref <10)

## 2023-08-18 LAB — CBC WITH DIFFERENTIAL/PLATELET
Abs Immature Granulocytes: 0.03 10*3/uL (ref 0.00–0.07)
Basophils Absolute: 0 10*3/uL (ref 0.0–0.1)
Basophils Relative: 1 %
Eosinophils Absolute: 0 10*3/uL (ref 0.0–0.5)
Eosinophils Relative: 0 %
HCT: 36.7 % (ref 36.0–46.0)
Hemoglobin: 12.2 g/dL (ref 12.0–15.0)
Immature Granulocytes: 1 %
Lymphocytes Relative: 27 %
Lymphs Abs: 1.5 10*3/uL (ref 0.7–4.0)
MCH: 33.2 pg (ref 26.0–34.0)
MCHC: 33.2 g/dL (ref 30.0–36.0)
MCV: 100 fL (ref 80.0–100.0)
Monocytes Absolute: 0.5 10*3/uL (ref 0.1–1.0)
Monocytes Relative: 8 %
Neutro Abs: 3.6 10*3/uL (ref 1.7–7.7)
Neutrophils Relative %: 63 %
Platelets: 214 10*3/uL (ref 150–400)
RBC: 3.67 MIL/uL — ABNORMAL LOW (ref 3.87–5.11)
RDW: 14.6 % (ref 11.5–15.5)
WBC: 5.7 10*3/uL (ref 4.0–10.5)
nRBC: 0 % (ref 0.0–0.2)

## 2023-08-18 LAB — RAPID URINE DRUG SCREEN, HOSP PERFORMED
Amphetamines: NOT DETECTED
Barbiturates: NOT DETECTED
Benzodiazepines: POSITIVE — AB
Cocaine: NOT DETECTED
Opiates: NOT DETECTED
Tetrahydrocannabinol: NOT DETECTED

## 2023-08-18 LAB — CK: Total CK: 107 U/L (ref 38–234)

## 2023-08-18 LAB — MAGNESIUM: Magnesium: 1.4 mg/dL — ABNORMAL LOW (ref 1.7–2.4)

## 2023-08-18 LAB — LACTIC ACID, PLASMA
Lactic Acid, Venous: 2.6 mmol/L (ref 0.5–1.9)
Lactic Acid, Venous: 1.2 mmol/L (ref 0.5–1.9)

## 2023-08-18 MED ORDER — ACETAMINOPHEN 650 MG RE SUPP: 650.0000 mg | Freq: Four times a day (QID) | RECTAL | Status: DC | PRN

## 2023-08-18 MED ORDER — ORAL CARE MOUTH RINSE: 15.0000 mL | OROMUCOSAL | Status: DC | PRN

## 2023-08-18 MED ORDER — LEVETIRACETAM 500 MG PO TABS
500.0000 mg | ORAL_TABLET | Freq: Two times a day (BID) | ORAL | Status: DC
  Administered 2023-08-18 – 2023-08-19 (×3): 500 mg via ORAL
  Filled 2023-08-18 (×3): qty 1

## 2023-08-18 MED ORDER — FOLIC ACID 1 MG PO TABS
1.0000 mg | ORAL_TABLET | Freq: Every day | ORAL | Status: DC
  Administered 2023-08-18 – 2023-08-19 (×2): 1 mg via ORAL
  Filled 2023-08-18 (×2): qty 1

## 2023-08-18 MED ORDER — ACETAMINOPHEN 325 MG PO TABS
650.0000 mg | ORAL_TABLET | Freq: Four times a day (QID) | ORAL | Status: DC | PRN
  Administered 2023-08-19 (×2): 650 mg via ORAL
  Filled 2023-08-18 (×2): qty 2

## 2023-08-18 MED ORDER — DIAZEPAM 5 MG/ML IJ SOLN: 5.0000 mg | INTRAMUSCULAR | Status: DC | PRN

## 2023-08-18 MED ORDER — ONDANSETRON HCL 4 MG/2ML IJ SOLN: 4.0000 mg | Freq: Four times a day (QID) | INTRAMUSCULAR | Status: DC | PRN

## 2023-08-18 MED ORDER — LORAZEPAM 1 MG PO TABS
1.0000 mg | ORAL_TABLET | ORAL | Status: DC | PRN
  Administered 2023-08-18: 1 mg via ORAL
  Filled 2023-08-18: qty 1

## 2023-08-18 MED ORDER — ADULT MULTIVITAMIN W/MINERALS CH
1.0000 | ORAL_TABLET | Freq: Every day | ORAL | Status: DC
  Administered 2023-08-18 – 2023-08-19 (×2): 1 via ORAL
  Filled 2023-08-18 (×2): qty 1

## 2023-08-18 MED ORDER — MAGNESIUM SULFATE 2 GM/50ML IV SOLN
2.0000 g | Freq: Once | INTRAVENOUS | Status: AC
Start: 2023-08-18 — End: 2023-08-18
  Administered 2023-08-18: 2 g via INTRAVENOUS
  Filled 2023-08-18: qty 50

## 2023-08-18 MED ORDER — SODIUM CHLORIDE 0.9 % IV SOLN
3.0000 g | Freq: Once | INTRAVENOUS | Status: AC
  Administered 2023-08-18: 3 g via INTRAVENOUS
  Filled 2023-08-18: qty 8

## 2023-08-18 MED ORDER — LORAZEPAM 2 MG/ML IJ SOLN
INTRAMUSCULAR | Status: AC
  Filled 2023-08-18: qty 1

## 2023-08-18 MED ORDER — POTASSIUM CHLORIDE 10 MEQ/100ML IV SOLN
10.0000 meq | Freq: Once | INTRAVENOUS | Status: AC
  Administered 2023-08-18: 10 meq via INTRAVENOUS
  Filled 2023-08-18: qty 100

## 2023-08-18 MED ORDER — ENOXAPARIN SODIUM 40 MG/0.4ML IJ SOSY
40.0000 mg | PREFILLED_SYRINGE | INTRAMUSCULAR | Status: DC
  Administered 2023-08-18 – 2023-08-19 (×2): 40 mg via SUBCUTANEOUS
  Filled 2023-08-18 (×2): qty 0.4

## 2023-08-18 MED ORDER — NICOTINE 7 MG/24HR TD PT24
7.0000 mg | MEDICATED_PATCH | Freq: Every day | TRANSDERMAL | Status: DC
  Administered 2023-08-18 – 2023-08-19 (×2): 7 mg via TRANSDERMAL
  Filled 2023-08-18 (×2): qty 1

## 2023-08-18 MED ORDER — LACOSAMIDE 50 MG PO TABS
50.0000 mg | ORAL_TABLET | Freq: Two times a day (BID) | ORAL | Status: DC
  Administered 2023-08-18 – 2023-08-19 (×3): 50 mg via ORAL
  Filled 2023-08-18 (×3): qty 1

## 2023-08-18 MED ORDER — LEVETIRACETAM IN NACL 1000 MG/100ML IV SOLN
1000.0000 mg | Freq: Once | INTRAVENOUS | Status: AC
  Administered 2023-08-18: 1000 mg via INTRAVENOUS
  Filled 2023-08-18: qty 100

## 2023-08-18 MED ORDER — ONDANSETRON HCL 4 MG PO TABS: 4.0000 mg | ORAL_TABLET | Freq: Four times a day (QID) | ORAL | Status: DC | PRN

## 2023-08-18 MED ORDER — LORAZEPAM 2 MG/ML IJ SOLN: 2.0000 mg | Freq: Once | INTRAMUSCULAR | Status: AC

## 2023-08-18 MED ORDER — POTASSIUM CHLORIDE CRYS ER 20 MEQ PO TBCR
40.0000 meq | EXTENDED_RELEASE_TABLET | ORAL | Status: AC
  Administered 2023-08-18 (×2): 40 meq via ORAL
  Filled 2023-08-18 (×2): qty 2

## 2023-08-18 MED ORDER — POTASSIUM CHLORIDE CRYS ER 20 MEQ PO TBCR
40.0000 meq | EXTENDED_RELEASE_TABLET | Freq: Once | ORAL | Status: AC
  Administered 2023-08-18: 40 meq via ORAL
  Filled 2023-08-18: qty 2

## 2023-08-18 MED ORDER — THIAMINE MONONITRATE 100 MG PO TABS
100.0000 mg | ORAL_TABLET | Freq: Every day | ORAL | Status: DC
  Administered 2023-08-18 – 2023-08-19 (×2): 100 mg via ORAL
  Filled 2023-08-18 (×2): qty 1

## 2023-08-18 MED ORDER — GABAPENTIN 300 MG PO CAPS
300.0000 mg | ORAL_CAPSULE | Freq: Two times a day (BID) | ORAL | Status: DC
  Administered 2023-08-18 – 2023-08-19 (×3): 300 mg via ORAL
  Filled 2023-08-18 (×3): qty 1

## 2023-08-18 NOTE — ED Notes (Signed)
RN explained to the pt she will need mittens d/t pulling at cords. Pt said, "ok." Pt allowed RN to apply mittens.

## 2023-08-18 NOTE — ED Notes (Signed)
Pt says she would like to use the restroom. RN informed her that at this time it is best for her to use a purewick. Pt layed down and nodded head yes

## 2023-08-18 NOTE — ED Notes (Signed)
ED TO INPATIENT HANDOFF REPORT  ED Nurse Name and Phone #: Cat [phone]  S Name/Age/Gender [name] 67 y.o. female Room/Bed: 003C/003C  Code Status   Code Status: Full Code  Home/SNF/Other Home Patient oriented to: self, place, time, and situation Is this baseline? Yes   Triage Complete: Triage complete  Chief Complaint Recurrent seizures (HCC) [G40.909]  Triage Note Pt BIB EMS from home. Reports 3 seizures tonight. 1 episode witnessed by ems 30 secs + 1 episode of vomiting.  Family told EMS she didn't want to take seizure med anymore.  Hx tbi   EMS admin 5mg  Midazolam IM  EMS VS HR 110, 140/80, cbg 136, 93-94% RA   Allergies Allergies  Allergen Reactions   Keflex [Cephalexin] Hives   Cephalosporins Hives   Prednisone Swelling   Toradol [Ketorolac Tromethamine] Hives   Ultram [Tramadol] Hives    Level of Care/Admitting Diagnosis ED Disposition     ED Disposition  Admit   Condition  --   Comment  Hospital Area: [facility] [[id number]]  Level of Care: Progressive [102]  Admit to Progressive based on following criteria: NEUROLOGICAL AND NEUROSURGICAL complex patients with significant risk of instability, who do not meet ICU criteria, yet require close observation or frequent assessment (< / = every 2 - 4 hours) with medical / nursing intervention.  May place patient in observation at [facility] or [facility] if equivalent level of care is available:: No  Covid Evaluation: Asymptomatic - no recent exposure (last 10 days) testing not required  Diagnosis: Recurrent seizures [facility]) [[id number]]  Admitting Physician: Almon Hercules [[id number]]  Attending Physician: Almon Hercules [[id number]]          B Medical/Surgery History Past Medical History:  Diagnosis Date   Alcohol dependence (HCC)    Anxiety    Chronic pain    Depression    Diverticulitis    Herniated cervical disc    Pancreatitis    Seizures (HCC)    alcoholic seizures    Past Surgical History:  Procedure Laterality Date   ABDOMINAL HYSTERECTOMY     ANKLE SURGERY Right    CARPAL TUNNEL RELEASE Bilateral    CERVICAL FUSION     CHOLECYSTECTOMY     KNEE SURGERY       A IV Location/Drains/Wounds Patient Lines/Drains/Airways Status     Active Line/Drains/Airways     Name Placement date Placement time Site Days   Peripheral IV 08/18/23 22 G 1.75" Anterior;Right Forearm 08/18/23  0926  Forearm  less than 1            Intake/Output Last 24 hours No intake or output data in the 24 hours ending 08/18/23 1213  Labs/Imaging Results for orders placed or performed during the hospital encounter of 08/18/23 (from the past 48 hour(s))  CBC with Differential     Status: Abnormal   Collection Time: 08/18/23  2:25 AM  Result Value Ref Range   WBC 5.7 4.0 - 10.5 K/uL   RBC 3.67 (L) 3.87 - 5.11 MIL/uL   Hemoglobin 12.2 12.0 - 15.0 g/dL   HCT 08.6 57.8 - 46.9 %   MCV 100.0 80.0 - 100.0 fL   MCH 33.2 26.0 - 34.0 pg   MCHC 33.2 30.0 - 36.0 g/dL   RDW 62.9 52.8 - 41.3 %   Platelets 214 150 - 400 K/uL   nRBC 0.0 0.0 - 0.2 %   Neutrophils Relative % 63 %   Neutro Abs 3.6  1.7 - 7.7 K/uL   Lymphocytes Relative 27 %   Lymphs Abs 1.5 0.7 - 4.0 K/uL   Monocytes Relative 8 %   Monocytes Absolute 0.5 0.1 - 1.0 K/uL   Eosinophils Relative 0 %   Eosinophils Absolute 0.0 0.0 - 0.5 K/uL   Basophils Relative 1 %   Basophils Absolute 0.0 0.0 - 0.1 K/uL   Immature Granulocytes 1 %   Abs Immature Granulocytes 0.03 0.00 - 0.07 K/uL    Comment: Performed at [facility], [address], [place], [place] [address]  Comprehensive metabolic panel     Status: Abnormal   Collection Time: 08/18/23  2:25 AM  Result Value Ref Range   Sodium 136 135 - 145 mmol/L   Potassium 2.5 (LL) 3.5 - 5.1 mmol/L    Comment: CRITICAL RESULT CALLED TO, READ BACK BY AND VERIFIED WITH GASQUE, S. RN @ 0310 08/18/23 JBUTLER   Chloride 102 98 - 111 mmol/L   CO2 17 (L) 22 - 32 mmol/L    Glucose, Bld 174 (H) 70 - 99 mg/dL    Comment: Glucose reference range applies only to samples taken after fasting for at least 8 hours.   BUN <5 (L) 8 - 23 mg/dL   Creatinine, Ser 6.04 0.44 - 1.00 mg/dL   Calcium 8.5 (L) 8.9 - 10.3 mg/dL   Total Protein 6.2 (L) 6.5 - 8.1 g/dL   Albumin 3.5 3.5 - 5.0 g/dL   AST 40 15 - 41 U/L   ALT 23 0 - 44 U/L   Alkaline Phosphatase 57 38 - 126 U/L   Total Bilirubin 0.7 0.3 - 1.2 mg/dL   GFR, Estimated >54 >09 mL/min    Comment: (NOTE) Calculated using the CKD-EPI Creatinine Equation (2021)    Anion gap 17 (H) 5 - 15    Comment: Performed at [facility], [address] [place], [place] [address]  Ethanol     Status: None   Collection Time: 08/18/23  2:25 AM  Result Value Ref Range   Alcohol, Ethyl (B) <10 <10 mg/dL    Comment: (NOTE) Lowest detectable limit for serum alcohol is 10 mg/dL.  For medical purposes only. Performed at [facility], [address], [place], [place] [address]   Magnesium     Status: Abnormal   Collection Time: 08/18/23  2:25 AM  Result Value Ref Range   Magnesium 1.4 (L) 1.7 - 2.4 mg/dL    Comment: Performed at [facility], [address] [place], [place] [address]  Lactic acid, plasma     Status: Abnormal   Collection Time: 08/18/23  8:42 AM  Result Value Ref Range   Lactic Acid, Venous 2.6 (HH) 0.5 - 1.9 mmol/L    Comment: CRITICAL RESULT CALLED TO, READ BACK BY AND VERIFIED WITH Normand Sloop, RN [phone] 08/18/2023 SANDOVAL K Performed at [facility], [address], [place], [place] [address]   CK     Status: None   Collection Time: 08/18/23  8:42 AM  Result Value Ref Range   Total CK 107 38 - 234 U/L    Comment: Performed at [facility], [address] [place], [place] [address]  Lactic acid, plasma     Status: None   Collection Time: 08/18/23 11:03 AM  Result Value Ref Range   Lactic Acid, Venous 1.2 0.5 - 1.9 mmol/L    Comment: Performed at [facility],  [address], [place], [place] [address]   DG Chest Port 1 View  Result Date: 08/18/2023 CLINICAL DATA:  Seizure and emesis EXAM: PORTABLE CHEST 1 VIEW COMPARISON:  04/02/2022 FINDINGS: Interstitial coarsening at the lung bases, greater on the left. Low volume chest with asymmetric elevation of the right diaphragm. No Kerley lines, effusion, or pneumothorax. Borderline heart size and stable mediastinal contours. Artifact from EKG leads. IMPRESSION: Interstitial coarsening at the bases which could be atelectasis or aspiration. Consider follow-up. Electronically Signed   By: Tiburcio Pea M.D.   On: 08/18/2023 05:03   CT HEAD WO CONTRAST ( )  Result Date: 08/18/2023 CLINICAL DATA:  Seizure disorder with clinical change EXAM: CT HEAD WITHOUT CONTRAST TECHNIQUE: Contiguous axial images were obtained from the base of the skull through the vertex without intravenous contrast. RADIATION DOSE REDUCTION: This exam was performed according to the departmental dose-optimization program which includes automated exposure control, adjustment of the mA and/or kV according to patient size and/or use of iterative reconstruction technique. COMPARISON:  04/05/2023 FINDINGS: Brain: No evidence of acute infarction, hemorrhage, hydrocephalus, extra-axial collection or mass lesion/mass effect. Generalized brain atrophy, greater than expected for age. Vascular: No hyperdense vessel or unexpected calcification. Skull: Normal. Negative for fracture or focal lesion. Sinuses/Orbits: No acute finding. IMPRESSION: 1. No acute finding. 2. Generalized brain atrophy. Electronically Signed   By: Tiburcio Pea M.D.   On: 08/18/2023 04:02    Pending Labs Unresulted Labs (From admission, onward)     Start     Ordered   08/25/23 0500  Creatinine, serum  (enoxaparin (LOVENOX)    CrCl >/= 30 ml/min)  Weekly,   R     Comments: while on enoxaparin therapy    08/18/23 0947   08/18/23 0951  Urinalysis, Routine w reflex microscopic  -Urine, Clean Catch  Once,   R       Question:  Specimen Source  Answer:  Urine, Clean Catch   08/18/23 0950   08/18/23 0225  Rapid urine drug screen (hospital performed)  ONCE - STAT,   STAT        08/18/23 0225            Vitals/Pain Today's Vitals   08/18/23 1000 08/18/23 1019 08/18/23 1109 08/18/23 1137  BP:  139/76  (!) 140/89  Pulse:  80    Resp:  20  (!) 26  Temp:    98.6 F (37 C)  TempSrc:      SpO2:  100%    PainSc: 0-No pain 0-No pain 0-No pain     Isolation Precautions No active isolations  Medications Medications  potassium chloride SA (KLOR-CON M) CR tablet 40 mEq (40 mEq Oral Given 08/18/23 0826)  gabapentin (NEURONTIN) capsule 300 mg (300 mg Oral Given 08/18/23 1049)  lacosamide (VIMPAT) tablet 50 mg (50 mg Oral Given 08/18/23 1044)  levETIRAcetam (KEPPRA) tablet 500 mg (500 mg Oral Given 08/18/23 1046)  multivitamin with minerals tablet 1 tablet (1 tablet Oral Given 08/18/23 1048)  thiamine (VITAMIN B1) tablet 100 mg (100 mg Oral Given 08/18/23 1050)  folic acid (FOLVITE) tablet 1 mg (1 mg Oral Given 08/18/23 1048)  enoxaparin (LOVENOX) injection 40 mg (40 mg Subcutaneous Given 08/18/23 1052)  acetaminophen (TYLENOL) tablet 650 mg (has no administration in time range)    Or  acetaminophen (TYLENOL) suppository 650 mg (has no administration in time range)  ondansetron (ZOFRAN) tablet 4 mg (has no administration in time range)    Or  ondansetron (ZOFRAN) injection 4 mg (  has no administration in time range)  nicotine (NICODERM CQ - dosed in mg/24 hr) patch 7 mg (7 mg Transdermal Patch Applied 08/18/23 1050)  levETIRAcetam (KEPPRA) IVPB 1000 mg/100 mL premix (0 mg Intravenous Stopped 08/18/23 0329)  magnesium sulfate IVPB 2 g 50 mL (0 g Intravenous Stopped 08/18/23 0430)  potassium chloride 10 mEq in 100 mL IVPB (0 mEq Intravenous Stopped 08/18/23 0430)  potassium chloride SA (KLOR-CON M) CR tablet 40 mEq (40 mEq Oral Given 08/18/23 0329)  LORazepam  (ATIVAN) injection 2 mg ( Intravenous Given by Other 08/18/23 0454)  Ampicillin-Sulbactam (UNASYN) 3 g in sodium chloride 0.9 % 100 mL IVPB (0 g Intravenous Stopped 08/18/23 0711)    Mobility non-ambulatory     Focused Assessments Neuro Assessment Handoff:  Swallow screen pass?            Neuro Assessment:   Neuro Checks:      Has TPA been given? No If patient is a Neuro Trauma and patient is going to OR before floor call report to 4N Charge nurse: [phone] or [phone]   R Recommendations: See Admitting Provider Note  Report given to:   Additional Notes:

## 2023-08-18 NOTE — ED Notes (Signed)
Pt pulling seizure pads off bed. Pt stated she wants to prop her neck on the pad. RN provided pt a pillow and reattached seizure pads.

## 2023-08-18 NOTE — ED Notes (Signed)
Pt pulled IV out. RN will consult IV team

## 2023-08-18 NOTE — Plan of Care (Signed)

## 2023-08-18 NOTE — ED Notes (Signed)
Pt removed mitten and stated she needs to get up to go get her matches to smoke. RN reapplied mittens and informed pt those items aren't available d/t safety hazard.

## 2023-08-18 NOTE — ED Notes (Signed)
RN reattached cardiac cords.

## 2023-08-18 NOTE — ED Notes (Signed)
Pt attempted to get out bed. RN informed pt to lay back down. Pt says ok and followed commands.

## 2023-08-18 NOTE — ED Provider Notes (Signed)
[place] EMERGENCY DEPARTMENT AT [facility] Provider Note   CSN: [id number] Arrival date & time: 08/18/23  0208     History  Chief Complaint  Patient presents with   Seizures    [name] is a 67 y.o. female.  The history is provided by the patient and medical records.  Seizures  67 year old female with history of alcohol abuse, GERD, seizure disorder, chronic thrombocytopenia, presenting to the ED after multiple seizures at home tonight.  Family called EMS due to 3 seizures tonight, last episode witnessed by EMS which lasted approximately 30 seconds described as tonic-clonic.  She was given 5 mg IM Versed with cessation of seizure.  She did vomit once afterwards.  Family reported to EMS that patient expressed she did not want to take her seizure medicine anymore.  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  On arrival to ED patient is awake and alert, she does seem a little confused presently.  Home Medications Prior to Admission medications   Medication Sig Start Date End Date Taking? Authorizing Provider  acetaminophen (TYLENOL) 500 MG tablet Take 500 mg by mouth every 6 (six) hours as needed for moderate pain.    [provider]  folic acid (FOLVITE) 1 MG tablet Take 1 tablet (1 mg total) by mouth daily. Patient not taking: Reported on 05/06/2023 12/22/22   Osvaldo Shipper, MD  gabapentin (NEURONTIN) 300 MG capsule Take 1 capsule (300 mg total) by mouth 2 (two) times daily. 12/21/22   Osvaldo Shipper, MD  lacosamide (VIMPAT) 50 MG TABS tablet Take 1 tablet (50 mg total) by mouth 2 (two) times daily. 05/09/23   Swayze, Ava, DO  levETIRAcetam (KEPPRA) 500 MG tablet Take 1 tablet (500 mg total) by mouth 2 (two) times daily. 05/09/23   Swayze, Ava, DO  loratadine (CLARITIN) 10 MG tablet Take 1 tablet (10 mg total) by mouth daily. 12/22/22   Osvaldo Shipper, MD  Multiple Vitamin (MULTIVITAMIN WITH MINERALS) TABS tablet Take 1 tablet by mouth daily. 05/10/23   Swayze, Ava,  DO  pantoprazole (PROTONIX) 40 MG tablet Take 1 tablet (40 mg total) by mouth daily. Patient not taking: Reported on 05/06/2023 12/21/22 02/19/23  Osvaldo Shipper, MD  polyethylene glycol powder (GLYCOLAX/MIRALAX) 17 GM/SCOOP powder Take 1 capful (17 g) by mouth daily. 12/22/22   Osvaldo Shipper, MD  thiamine (VITAMIN B-1) 100 MG tablet Take 1 tablet (100 mg total) by mouth daily. 05/10/23   Swayze, Ava, DO  DULoxetine (CYMBALTA) 30 MG capsule Take 1 capsule (30 mg total) by mouth daily. Patient not taking: Reported on 09/01/2020 06/11/20 09/02/20  Money, [name], FNP  metoCLOPramide (REGLAN) 10 MG tablet Take 1 tablet (10 mg total) by mouth every 6 (six) hours as needed for nausea (nausea/headache). 04/06/19 05/24/19  Mesner, Barbara Cower, MD      Allergies    Keflex [cephalexin], Cephalosporins, Prednisone, Toradol [ketorolac tromethamine], and Ultram [tramadol]    Review of Systems   Review of Systems  Neurological:  Positive for seizures.  All other systems reviewed and are negative.   Physical Exam Updated Vital Signs BP 136/83   Pulse (!) 107   Temp 98.6 F (37 C)   Resp 15   SpO2 94%   Physical Exam Vitals and nursing note reviewed.  Constitutional:      Appearance: She is well-developed.  HENT:     Head: Normocephalic and atraumatic.     Mouth/Throat:     Comments: No noted dental/tongue trauma Eyes:  Conjunctiva/sclera: Conjunctivae normal.     Pupils: Pupils are equal, round, and reactive to light.  Cardiovascular:     Rate and Rhythm: Normal rate and regular rhythm.     Heart sounds: Normal heart sounds.  Pulmonary:     Effort: Pulmonary effort is normal.     Breath sounds: Normal breath sounds.  Abdominal:     General: Bowel sounds are normal.     Palpations: Abdomen is soft.  Musculoskeletal:        General: Normal range of motion.     Cervical back: Normal range of motion.  Skin:    General: Skin is warm and dry.  Neurological:     Mental Status: She is alert.      Comments: Awake, alert, responds to name, fidgeting with IV and blankets on exam, spontaneously moving arms/legs, she does follow commands but seems a bit confused still     ED Results / Procedures / Treatments   Labs (all labs ordered are listed, but only abnormal results are displayed) Labs Reviewed  CBC WITH DIFFERENTIAL/PLATELET - Abnormal; Notable for the following components:      Result Value   RBC 3.67 (*)    All other components within normal limits  COMPREHENSIVE METABOLIC PANEL - Abnormal; Notable for the following components:   Potassium 2.5 (*)    CO2 17 (*)    Glucose, Bld 174 (*)    BUN <5 (*)    Calcium 8.5 (*)    Total Protein 6.2 (*)    Anion gap 17 (*)    All other components within normal limits  MAGNESIUM - Abnormal; Notable for the following components:   Magnesium 1.4 (*)    All other components within normal limits  ETHANOL  RAPID URINE DRUG SCREEN, HOSP PERFORMED    EKG None  Radiology DG Chest Port 1 View  Result Date: 08/18/2023 CLINICAL DATA:  Seizure and emesis EXAM: PORTABLE CHEST 1 VIEW COMPARISON:  04/02/2022 FINDINGS: Interstitial coarsening at the lung bases, greater on the left. Low volume chest with asymmetric elevation of the right diaphragm. No Kerley lines, effusion, or pneumothorax. Borderline heart size and stable mediastinal contours. Artifact from EKG leads. IMPRESSION: Interstitial coarsening at the bases which could be atelectasis or aspiration. Consider follow-up. Electronically Signed   By: Tiburcio Pea M.D.   On: 08/18/2023 05:03   CT HEAD WO CONTRAST ( )  Result Date: 08/18/2023 CLINICAL DATA:  Seizure disorder with clinical change EXAM: CT HEAD WITHOUT CONTRAST TECHNIQUE: Contiguous axial images were obtained from the base of the skull through the vertex without intravenous contrast. RADIATION DOSE REDUCTION: This exam was performed according to the departmental dose-optimization program which includes automated exposure  control, adjustment of the mA and/or kV according to patient size and/or use of iterative reconstruction technique. COMPARISON:  04/05/2023 FINDINGS: Brain: No evidence of acute infarction, hemorrhage, hydrocephalus, extra-axial collection or mass lesion/mass effect. Generalized brain atrophy, greater than expected for age. Vascular: No hyperdense vessel or unexpected calcification. Skull: Normal. Negative for fracture or focal lesion. Sinuses/Orbits: No acute finding. IMPRESSION: 1. No acute finding. 2. Generalized brain atrophy. Electronically Signed   By: Tiburcio Pea M.D.   On: 08/18/2023 04:02    Procedures Procedures    CRITICAL CARE Performed by: Garlon Hatchet   Total critical care time: 45 minutes  Critical care time was exclusive of separately billable procedures and treating other patients.  Critical care was necessary to treat or prevent imminent or life-threatening deterioration.  Critical care was time spent personally by me on the following activities: development of treatment plan with patient and/or surrogate as well as nursing, discussions with consultants, evaluation of patient's response to treatment, examination of patient, obtaining history from patient or surrogate, ordering and performing treatments and interventions, ordering and review of laboratory studies, ordering and review of radiographic studies, pulse oximetry and re-evaluation of patient's condition. '  Medications Ordered in ED Medications  Ampicillin-Sulbactam (UNASYN) 3 g in sodium chloride 0.9 % 100 mL IVPB (has no administration in time range)  levETIRAcetam (KEPPRA) IVPB 1000 mg/100 mL premix (0 mg Intravenous Stopped 08/18/23 0329)  magnesium sulfate IVPB 2 g 50 mL (0 g Intravenous Stopped 08/18/23 0430)  potassium chloride 10 mEq in 100 mL IVPB (0 mEq Intravenous Stopped 08/18/23 0430)  potassium chloride SA (KLOR-CON M) CR tablet 40 mEq (40 mEq Oral Given 08/18/23 0329)  LORazepam (ATIVAN)  injection 2 mg ( Intravenous Given by Other 08/18/23 1610)    ED Course/ Medical Decision Making/ A&P                                 Medical Decision Making Amount and/or Complexity of Data Reviewed Labs: ordered. Radiology: ordered and independent interpretation performed. ECG/medicine tests: ordered and independent interpretation performed.  Risk Prescription drug management. Decision regarding hospitalization.   67 year old female presenting to the ED following seizures x 3 at home tonight, last of which witnessed by EMS and resolved with IM Versed.  Did have episode of emesis afterwards.  Patient reportedly stopped taking her medicine per family earlier in the week.  Patient is awake on arrival, she is conversant.  She does seem a bit drowsy still.  She reports she simply did not want to take her medication anymore.  She denies fever or any other infectious symptoms.  Will plan for CT and CXR, screening labs.  Given IV keppra load.    Labs as above--no leukocytosis.  Potassium is low at 2.5.  She is given IV and oral replacement.  Magnesium also low, given IV magnesium as well.  Head CT without acute findings.  Chest x-ray read is pending.  9604-- went to reassess patient, having horizontal ticking nystagmus of the eyes bilaterally, smacking movements of the lips, not responding to stimuli, leftward gaze.  I suspect she is continuing to have seizures.  Given dose of ativan with cessation of this.  At this point, she will require admission.  5:02 AM Discussed with neuro hospitalist, Dr. Otelia Limes-- recommends re-starting home seizure meds.  Would not recommend changing/adding anything at this point.  If continues to have seizures, formal consult at that point.  CXR with questionable aspiration PNA.  She did have emesis after last seizure with EMS so she is high risk for this.  Given IV unasyn.  Discussed with Dr. Loney Loh-- morning team will admit.  Final Clinical Impression(s) / ED  Diagnoses Final diagnoses:  Seizure (HCC)  Aspiration pneumonia due to vomit, unspecified laterality, unspecified part of lung [facility])    Rx / DC Orders ED Discharge Orders     None         Garlon Hatchet, PA-C 08/18/23 5409    Glynn Octave, MD 08/19/23 [phone]

## 2023-08-18 NOTE — ED Notes (Signed)
RN assisted pt back in bed and provided water to drink.

## 2023-08-18 NOTE — Progress Notes (Signed)
EEG complete - results pending 

## 2023-08-18 NOTE — Progress Notes (Signed)
Admission Note:  Pt arrived from ED to [address] [place], at approximately 1450. Pt arrived via stretcher, with bilateral mittens on. Pt is alert and oriented x2, to self and place. Pt denied pain, and is on room air. Fall and seizure precautions are in place, and pt in NAD at this time.

## 2023-08-18 NOTE — H&P (Signed)
History and Physical    Patient: [name] ZOX:[id number] DOB: 25-Mar-1956 DOA: 08/18/2023 DOS: the patient was seen and examined on 08/18/2023 PCP: Diamantina Providence, FNP  Patient coming from: Home  Chief Complaint:  Chief Complaint  Patient presents with   Seizures   HPI: [name] is a 67 y.o. female with PMH of seizure disorder, EtOH withdrawal seizure, pancreatitis, anxiety, depression and SAH brought to ED by EMS after 3 episodes of witnessed seizure at home last night.   History provided by patient.  She is awake and alert and oriented x 4 except date but not a great historian.  She says she stopped taking Vimpat about 2 weeks ago due to "weird" dreams, ear ringing and dizziness.  She does not remember taking Keppra.  She reports pain with urination but not able to describe.  She denies burning, frequency or urgency.  She denies fever, chills, runny nose, sore throat, chest pain, shortness of breath, cough, nausea, vomiting, abdominal pain or diarrhea.  She denies focal neurosymptoms.  She has mittens on both hands due to confusion and restlessness.  She is asking for cigarettes.  She reports smoking about 3 cigarettes a day.  She also reports drinking about 3 beers a day.  Does not recall her last drink.  She likes to be full code.  Patient received Versed from EMS on her way to the hospital.  In ED, vital stable.  K2.5.  CO2 17.  AG 17.  Mg 1.4.  Glucose 174.  Otherwise, CBC and CMP without significant finding.  EtOH level <10.  CXR showed interstitial coarsening at the bases which could be atelectasis or aspiration.  EKG sinus tachycardia with LAFB and PRWP.  Patient received 1 g Keppra, IV ampicillin, 2 g IV Mg, KCl 50 mEq.  Neurology consulted by EDP and recommended continuing home seizure meds without change, and reconsulting if she has further seizure.  Admission accepted by overnight admitter.   Review of Systems: As mentioned in the history of present  illness. All other systems reviewed and are negative. Past Medical History:  Diagnosis Date   Alcohol dependence (HCC)    Anxiety    Chronic pain    Depression    Diverticulitis    Herniated cervical disc    Pancreatitis    Seizures (HCC)    alcoholic seizures   Past Surgical History:  Procedure Laterality Date   ABDOMINAL HYSTERECTOMY     ANKLE SURGERY Right    CARPAL TUNNEL RELEASE Bilateral    CERVICAL FUSION     CHOLECYSTECTOMY     KNEE SURGERY     Social History:  reports that she has an unknown smoking status. She has never used smokeless tobacco. She reports current alcohol use. She reports that she does not currently use drugs after having used the following drugs: Marijuana.  Allergies  Allergen Reactions   Keflex [Cephalexin] Hives   Cephalosporins Hives   Prednisone Swelling   Toradol [Ketorolac Tromethamine] Hives   Ultram [Tramadol] Hives    Family History  Family history unknown: Yes    Prior to Admission medications   Medication Sig Start Date End Date Taking? Authorizing Provider  acetaminophen (TYLENOL) 500 MG tablet Take 500 mg by mouth every 6 (six) hours as needed for moderate pain.    [provider]  folic acid (FOLVITE) 1 MG tablet Take 1 tablet (1 mg total) by mouth daily. Patient not taking: Reported on 05/06/2023 12/22/22   Osvaldo Shipper,  MD  gabapentin (NEURONTIN) 300 MG capsule Take 1 capsule (300 mg total) by mouth 2 (two) times daily. 12/21/22   Osvaldo Shipper, MD  lacosamide (VIMPAT) 50 MG TABS tablet Take 1 tablet (50 mg total) by mouth 2 (two) times daily. 05/09/23   Swayze, Ava, DO  levETIRAcetam (KEPPRA) 500 MG tablet Take 1 tablet (500 mg total) by mouth 2 (two) times daily. 05/09/23   Swayze, Ava, DO  loratadine (CLARITIN) 10 MG tablet Take 1 tablet (10 mg total) by mouth daily. 12/22/22   Osvaldo Shipper, MD  Multiple Vitamin (MULTIVITAMIN WITH MINERALS) TABS tablet Take 1 tablet by mouth daily. 05/10/23   Swayze, Ava, DO   pantoprazole (PROTONIX) 40 MG tablet Take 1 tablet (40 mg total) by mouth daily. Patient not taking: Reported on 05/06/2023 12/21/22 02/19/23  Osvaldo Shipper, MD  polyethylene glycol powder (GLYCOLAX/MIRALAX) 17 GM/SCOOP powder Take 1 capful (17 g) by mouth daily. 12/22/22   Osvaldo Shipper, MD  thiamine (VITAMIN B-1) 100 MG tablet Take 1 tablet (100 mg total) by mouth daily. 05/10/23   Swayze, Ava, DO  DULoxetine (CYMBALTA) 30 MG capsule Take 1 capsule (30 mg total) by mouth daily. Patient not taking: Reported on 09/01/2020 06/11/20 09/02/20  Money, [name], FNP  metoCLOPramide (REGLAN) 10 MG tablet Take 1 tablet (10 mg total) by mouth every 6 (six) hours as needed for nausea (nausea/headache). 04/06/19 05/24/19  Mesner, Barbara Cower, MD    Physical Exam: Vitals:   08/18/23 0720 08/18/23 1019 08/18/23 1137 08/18/23 1252  BP: 118/83 139/76 (!) 140/89 132/61  Pulse: 83 80  70  Resp: 18 20 (!) 26 (!) 22  Temp: 98.3 F (36.8 C)  98.6 F (37 C)   TempSrc: Oral     SpO2: 100% 100%  99%   GENERAL: No apparent distress.  Nontoxic. HEENT: MMM.  Vision and hearing grossly intact.  NECK: Supple.  No apparent JVD.  RESP:  No IWOB.  Fair aeration bilaterally. CVS:  RRR. Heart sounds normal.  ABD/GI/GU: BS+. Abd soft, NTND.  MSK/EXT:   No apparent deformity. Moves extremities. No edema.  Mittens bilaterally. SKIN: no apparent skin lesion or wound NEURO: Awake and alert.  Oriented x 4 except date but confused about situation.  Follows commands.  No apparent focal neuro deficit. PSYCH: Calm. Normal affect.  Data Reviewed: See HPI  Assessment and Plan: Principal Problem:   Recurrent seizures (HCC) Active Problems:   Alcohol use disorder, severe, dependence (HCC)   Nonadherence to medication   Hypokalemia   History of seizure   GAD (generalized anxiety disorder)    Breakthrough seizure/recurrent seizure: Likely due to noncompliance with AEDs and ongoing alcohol use.  Reportedly stopped taking her Vimpat  about 2 weeks ago due to "weird" dreams, ear ringing and dizziness.  She is not sure if she is taking Keppra.  Patient somewhat confused and not a reliable historian.  CT head without acute finding.  Received Versed on the way to the hospital.  Received Keppra 1 g in ED. EDP discussed with neurology, who recommended continuing home AEDs without change and reconsulting if she has further seizure.  Spot EEG negative for seizure -Resume home Keppra and Vimpat -IV diazepam 5 mg every 4 hours as needed seizure -Continue home gabapentin -CIWA with as needed Ativan for alcohol withdrawal -Seizure precaution -Check UA and UDS  Possible alcohol withdrawal seizure -Management as above. -Continue home gabapentin -Multivitamin, thiamine and folic acid.  Confusion/delirium: reportedly restless and pulling lines.  Very high  risk for fall. -CIWA with as needed Ativan for possible alcohol withdrawal -Continue mittens for now.  Anxiety and depression: Does not seem to be on medication for this.  Hypokalemia/hypomagnesemia:  -Monitor replenish as appropriate  GERD -Continue PPI  Tobacco use disorder -Nicotine patch  Lactic acidosis: Likely due to seizure.  Resolved.    Advance Care Planning:   Code Status: Full Code   Consults: Neurology by EDP  Family Communication: None at bedside  Severity of Illness: The appropriate patient status for this patient is OBSERVATION. Observation status is judged to be reasonable and necessary in order to provide the required intensity of service to ensure the patient's safety. The patient's presenting symptoms, physical exam findings, and initial radiographic and laboratory data in the context of their medical condition is felt to place them at decreased risk for further clinical deterioration. Furthermore, it is anticipated that the patient will be medically stable for discharge from the hospital within 2 midnights of admission.   Author: Almon Hercules,  MD 08/18/2023 12:55 PM  For on call review [email or web address].

## 2023-08-18 NOTE — ED Triage Notes (Signed)
Pt BIB EMS from home. Reports 3 seizures tonight. 1 episode witnessed by ems 30 secs + 1 episode of vomiting.  Family told EMS she didn't want to take seizure med anymore.  Hx tbi   EMS admin 5mg  Midazolam IM  EMS VS HR 110, 140/80, cbg 136, 93-94% RA

## 2023-08-18 NOTE — ED Notes (Signed)
EEG technician at the bedside.

## 2023-08-18 NOTE — ED Notes (Signed)
Pt being placed on EEG at this time.  Unable to complete admit

## 2023-08-18 NOTE — ED Notes (Signed)
Pt pulling off cords. RN applied 5 lead and removed pulse ox and bp cuff.

## 2023-08-18 NOTE — ED Notes (Signed)
RN adjusted pt mittens.

## 2023-08-18 NOTE — Procedures (Signed)
Patient Name: [name]  MRN: [id number]  Epilepsy Attending: Charlsie Quest  Referring Physician/Provider: Russella Dar, NP  Date: 08/18/2023 Duration: 26.09 mins  Patient history: 67 year old female with history of alcohol abuse, GERD, seizure disorder, chronic thrombocytopenia, presenting to the ED after multiple seizures at home tonight. EEG to evaluate for seizure  Level of alertness: Awake,  AEDs during EEG study: LEV, LCM, GBP  Technical aspects: This EEG study was done with scalp electrodes positioned according to the 10-20 International system of electrode placement. Electrical activity was reviewed with band pass filter of 1-70Hz , sensitivity of 7 uV/mm, display speed of 41mm/sec with a 60Hz  notched filter applied as appropriate. EEG data were recorded continuously and digitally stored.  Video monitoring was available and reviewed as appropriate.  Description:  No clear posterior dominant rhythm was seen. EEG showed continuous 3 to 6 Hz theta-delta slowing in right posterior temporal. Additionally there was excessive amount of 15 to 18 Hz beta activity distributed symmetrically and diffusely.  Photic driving was not seen during photic stimulation.  Hyperventilation was not performed.     ABNORMALITY - Continuous slow, right posterior temporal - Excessive Beta, generalized   IMPRESSION: This study is suggestive of cortical dysfunction arising from right posterior temporal region likely secondary to underlying structural abnormality. The excessive beta activity seen in the background is most likely due to the effect of benzodiazepine and is a benign EEG pattern. No seizures or epileptiform discharges were seen throughout the recording.   Please note lack of epileptiform activity during interictal eeg doesn't exclude diagnosis of epilepsy.    [name]

## 2023-08-18 NOTE — ED Notes (Signed)
Pt attempted to place legs over side rail to get out of bed. RN redirected pt in bed. Safety order placed.

## 2023-08-18 NOTE — ED Notes (Signed)
RN redirected pt to lay in bed. Pt states she needs to use restroom. RN educated pt on purewick. Pt verbalized understanding.   Pt was able to answer oriented questions appropriately.

## 2023-08-18 NOTE — ED Notes (Signed)
Pt tried to get out of bed to use restroom. RN educated pt that she is a seizure precaution and unsafe to get out at this time. RN informed pt she has a purewick and to use if needed. Pt layed back down.

## 2023-08-19 ENCOUNTER — Other Ambulatory Visit (HOSPITAL_COMMUNITY): Payer: Self-pay

## 2023-08-19 ENCOUNTER — Encounter (HOSPITAL_COMMUNITY): Payer: Self-pay | Admitting: Student

## 2023-08-19 DIAGNOSIS — Z87898 Personal history of other specified conditions: Secondary | ICD-10-CM | POA: Diagnosis not present

## 2023-08-19 DIAGNOSIS — F1020 Alcohol dependence, uncomplicated: Secondary | ICD-10-CM | POA: Diagnosis not present

## 2023-08-19 DIAGNOSIS — Z91148 Patient's other noncompliance with medication regimen for other reason: Secondary | ICD-10-CM | POA: Diagnosis not present

## 2023-08-19 DIAGNOSIS — G40909 Epilepsy, unspecified, not intractable, without status epilepticus: Secondary | ICD-10-CM | POA: Diagnosis not present

## 2023-08-19 LAB — COMPREHENSIVE METABOLIC PANEL
ALT: 17 U/L (ref 0–44)
AST: 22 U/L (ref 15–41)
Albumin: 3.1 g/dL — ABNORMAL LOW (ref 3.5–5.0)
Alkaline Phosphatase: 54 U/L (ref 38–126)
Anion gap: 10 (ref 5–15)
BUN: 5 mg/dL — ABNORMAL LOW (ref 8–23)
CO2: 20 mmol/L — ABNORMAL LOW (ref 22–32)
Calcium: 8.6 mg/dL — ABNORMAL LOW (ref 8.9–10.3)
Chloride: 107 mmol/L (ref 98–111)
Creatinine, Ser: 0.59 mg/dL (ref 0.44–1.00)
GFR, Estimated: >60 mL/min (ref >60)
Glucose, Bld: 82 mg/dL (ref 70–99)
Potassium: 3.5 mmol/L (ref 3.5–5.1)
Sodium: 137 mmol/L (ref 135–145)
Total Bilirubin: 0.6 mg/dL (ref 0.3–1.2)
Total Protein: 5.8 g/dL — ABNORMAL LOW (ref 6.5–8.1)

## 2023-08-19 LAB — CBC
HCT: 36.2 % (ref 36.0–46.0)
Hemoglobin: 12 g/dL (ref 12.0–15.0)
MCH: 33.6 pg (ref 26.0–34.0)
MCHC: 33.1 g/dL (ref 30.0–36.0)
MCV: 101.4 fL — ABNORMAL HIGH (ref 80.0–100.0)
Platelets: 220 10*3/uL (ref 150–400)
RBC: 3.57 MIL/uL — ABNORMAL LOW (ref 3.87–5.11)
RDW: 14.7 % (ref 11.5–15.5)
WBC: 4.7 10*3/uL (ref 4.0–10.5)
nRBC: 0 % (ref 0.0–0.2)

## 2023-08-19 LAB — MAGNESIUM: Magnesium: 1.9 mg/dL (ref 1.7–2.4)

## 2023-08-19 MED ORDER — GABAPENTIN 300 MG PO CAPS
300.0000 mg | ORAL_CAPSULE | Freq: Three times a day (TID) | ORAL | 2 refills | Status: DC
  Filled 2023-08-19: qty 90, 30d supply, fill #0

## 2023-08-19 MED ORDER — PANTOPRAZOLE SODIUM 40 MG PO TBEC
40.0000 mg | DELAYED_RELEASE_TABLET | Freq: Every day | ORAL | 1 refills | Status: DC
  Filled 2023-08-19: qty 30, 30d supply, fill #0

## 2023-08-19 MED ORDER — LEVETIRACETAM 500 MG PO TABS
500.0000 mg | ORAL_TABLET | Freq: Two times a day (BID) | ORAL | 2 refills | Status: DC
  Filled 2023-08-19: qty 60, 30d supply, fill #0

## 2023-08-19 MED ORDER — LACOSAMIDE 50 MG PO TABS
50.0000 mg | ORAL_TABLET | Freq: Two times a day (BID) | ORAL | 2 refills | Status: DC
  Filled 2023-08-19: qty 60, 30d supply, fill #0

## 2023-08-19 NOTE — Progress Notes (Signed)
CSW and Social Work Intern met pt at bedside to discuss [facility] consult for substance use. Social Work Tax inspector gave pt substance use resources and pt accepted.  Johnnette Gourd, MSW, Theresia Majors

## 2023-08-19 NOTE — Progress Notes (Signed)
Foley cath d/c'd per MD order.

## 2023-08-19 NOTE — Discharge Summary (Signed)
PATIENT DETAILS Name: [name] Age: 67 y.o. Sex: female Date of Birth: Dec 10, 1955 MRN: [id number]. Admitting Physician: Almon Hercules, MD OZD:[id number], Reggie Pile, FNP  Admit Date: 08/18/2023 Discharge date: 08/19/2023  Recommendations for Outpatient Follow-up:  Follow up with PCP in 1-2 weeks Please obtain CMP/CBC in one week Counseling regarding compliance to medications and abstaining from further alcohol use.  Admitted From:  Home  Disposition: Home   Discharge Condition: good  CODE STATUS:   Code Status: Full Code   Diet recommendation:  Diet Order             Diet general           Diet regular Room service appropriate? Yes; Fluid consistency: Thin  Diet effective now                    Brief Summary: 67 year old with history of alcohol use, SAH, seizure disorder-presented to the hospital with breakthrough seizures in the setting of ongoing alcohol use and noncompliance to antiepileptics.  Brief Hospital Course: Breakthrough seizures In the setting of noncompliance and alcohol use Back to baseline after resumption of Keppra/Vimpat Wants to go home-compliance emphasized/counseled ED physician had spoke with neurology-no recommendations apart from restarting antiepileptics  EtOH abuse Not sure if she has any inclination to quit No signs of withdrawal currently Was briefly on Ativan per CIWA protocol  Tobacco abuse Counseled  GERD PPI  Mood disorder Reported history of depression but has not taken medication in a week/months Follow-up with PCP to see if antidepressants need to be restarted.  Obesity: Estimated body mass index is 26.49 kg/m as calculated from the following:   Height as of 10/17/22: 5\' 1"  (1.549 m).   Weight as of 05/09/23: 63.6 kg.    Discharge Diagnoses:  Principal Problem:   Recurrent seizures (HCC) Active Problems:   Alcohol use disorder, severe, dependence (HCC)   Nonadherence to medication    Hypokalemia   History of seizure   GAD (generalized anxiety disorder)   Discharge Instructions:  Activity:  As tolerated   Allergies as of 08/19/2023       Reactions   Keflex [cephalexin] Hives   Cephalosporins Hives   Prednisone Swelling   Toradol [ketorolac Tromethamine] Hives   Ultram [tramadol] Hives        Medication List     STOP taking these medications    acetaZOLAMIDE 250 MG tablet Commonly known as: DIAMOX   brimonidine 0.2 % ophthalmic solution Commonly known as: ALPHAGAN   dorzolamide-timolol 2-0.5 % ophthalmic solution Commonly known as: COSOPT   FLUoxetine 20 MG capsule Commonly known as: PROZAC   folic acid 1 MG tablet Commonly known as: FOLVITE   latanoprost 0.005 % ophthalmic solution Commonly known as: XALATAN   loratadine 10 MG tablet Commonly known as: CLARITIN   moxifloxacin 0.5 % ophthalmic solution Commonly known as: VIGAMOX   oxyCODONE 5 MG immediate release tablet Commonly known as: Oxy IR/ROXICODONE   polyethylene glycol powder 17 GM/SCOOP powder Commonly known as: GLYCOLAX/MIRALAX   prednisoLONE acetate 1 % ophthalmic suspension Commonly known as: PRED FORTE   thiamine 100 MG tablet Commonly known as: Vitamin B-1       TAKE these medications    gabapentin 300 MG capsule Commonly known as: NEURONTIN Take 1 capsule (300 mg total) by mouth 3 (three) times daily. What changed:  medication strength how much to take Another medication with the same name was removed. Continue taking this medication, and follow  the directions you see here.   lacosamide 50 MG Tabs tablet Commonly known as: VIMPAT Take 1 tablet (50 mg total) by mouth 2 (two) times daily.   levETIRAcetam 500 MG tablet Commonly known as: KEPPRA Take 1 tablet (500 mg total) by mouth 2 (two) times daily.   pantoprazole 40 MG tablet Commonly known as: Protonix Take 1 tablet (40 mg total) by mouth daily.        Allergies  Allergen Reactions    Keflex [Cephalexin] Hives   Cephalosporins Hives   Prednisone Swelling   Toradol [Ketorolac Tromethamine] Hives   Ultram [Tramadol] Hives     Other Procedures/Studies: EEG adult  Result Date: September 13, 2023 Charlsie Quest, MD     Sep 13, 2023 12:54 PM Patient Name: [name] MRN: [id number] Epilepsy Attending: Charlsie Quest Referring Physician/Provider: Russella Dar, NP Date: 09-13-23 Duration: 26.09 mins Patient history: 67 year old female with history of alcohol abuse, GERD, seizure disorder, chronic thrombocytopenia, presenting to the ED after multiple seizures at home tonight. EEG to evaluate for seizure Level of alertness: Awake, AEDs during EEG study: LEV, LCM, GBP Technical aspects: This EEG study was done with scalp electrodes positioned according to the 10-20 International system of electrode placement. Electrical activity was reviewed with band pass filter of 1-70Hz , sensitivity of 7 uV/mm, display speed of 25mm/sec with a 60Hz  notched filter applied as appropriate. EEG data were recorded continuously and digitally stored.  Video monitoring was available and reviewed as appropriate. Description:  No clear posterior dominant rhythm was seen. EEG showed continuous 3 to 6 Hz theta-delta slowing in right posterior temporal. Additionally there was excessive amount of 15 to 18 Hz beta activity distributed symmetrically and diffusely.  Photic driving was not seen during photic stimulation.  Hyperventilation was not performed.   ABNORMALITY - Continuous slow, right posterior temporal - Excessive Beta, generalized  IMPRESSION: This study is suggestive of cortical dysfunction arising from right posterior temporal region likely secondary to underlying structural abnormality. The excessive beta activity seen in the background is most likely due to the effect of benzodiazepine and is a benign EEG pattern. No seizures or epileptiform discharges were seen throughout the recording.  Please  note lack of epileptiform activity during interictal eeg doesn't exclude diagnosis of epilepsy.  Charlsie Quest  DG Chest Port 1 View  Result Date: 09/13/23 CLINICAL DATA:  Seizure and emesis EXAM: PORTABLE CHEST 1 VIEW COMPARISON:  04/02/2022 FINDINGS: Interstitial coarsening at the lung bases, greater on the left. Low volume chest with asymmetric elevation of the right diaphragm. No Kerley lines, effusion, or pneumothorax. Borderline heart size and stable mediastinal contours. Artifact from EKG leads. IMPRESSION: Interstitial coarsening at the bases which could be atelectasis or aspiration. Consider follow-up. Electronically Signed   By: Tiburcio Pea M.D.   On: 09-13-2023 05:03   CT HEAD WO CONTRAST ( )  Result Date: 09-13-2023 CLINICAL DATA:  Seizure disorder with clinical change EXAM: CT HEAD WITHOUT CONTRAST TECHNIQUE: Contiguous axial images were obtained from the base of the skull through the vertex without intravenous contrast. RADIATION DOSE REDUCTION: This exam was performed according to the departmental dose-optimization program which includes automated exposure control, adjustment of the mA and/or kV according to patient size and/or use of iterative reconstruction technique. COMPARISON:  04/05/2023 FINDINGS: Brain: No evidence of acute infarction, hemorrhage, hydrocephalus, extra-axial collection or mass lesion/mass effect. Generalized brain atrophy, greater than expected for age. Vascular: No hyperdense vessel or unexpected calcification. Skull: Normal. Negative for fracture or focal lesion.  Sinuses/Orbits: No acute finding. IMPRESSION: 1. No acute finding. 2. Generalized brain atrophy. Electronically Signed   By: Tiburcio Pea M.D.   On: Sep 12, 2023 04:02     TODAY-DAY OF DISCHARGE:  Subjective:   [name] today has no headache,no chest abdominal pain,no new weakness tingling or numbness, feels much better wants to go home today.   Objective:   Blood pressure  123/83, pulse 76, temperature 98.1 F (36.7 C), temperature source Oral, resp. rate 16, SpO2 97%.  Intake/Output Summary (Last 24 hours) at 08/19/2023 1111 Last data filed at 08/19/2023 1000 Gross per 24 hour  Intake 640 ml  Output 1375 ml  Net -735 ml   There were no vitals filed for this visit.  Exam: Awake Alert, Oriented *3, No new F.N deficits, Normal affect [place].AT,PERRAL Supple Neck,No JVD, No cervical lymphadenopathy appriciated.  Symmetrical Chest wall movement, Good air movement bilaterally, CTAB RRR,No Gallops,Rubs or new Murmurs, No Parasternal Heave +ve B.Sounds, Abd Soft, Non tender, No organomegaly appriciated, No rebound -guarding or rigidity. No Cyanosis, Clubbing or edema, No new Rash or bruise   PERTINENT RADIOLOGIC STUDIES: EEG adult  Result Date: 09-12-2023 Charlsie Quest, MD     Sep 12, 2023 12:54 PM Patient Name: [name] MRN: [id number] Epilepsy Attending: Charlsie Quest Referring Physician/Provider: Russella Dar, NP Date: Sep 12, 2023 Duration: 26.09 mins Patient history: 67 year old female with history of alcohol abuse, GERD, seizure disorder, chronic thrombocytopenia, presenting to the ED after multiple seizures at home tonight. EEG to evaluate for seizure Level of alertness: Awake, AEDs during EEG study: LEV, LCM, GBP Technical aspects: This EEG study was done with scalp electrodes positioned according to the 10-20 International system of electrode placement. Electrical activity was reviewed with band pass filter of 1-70Hz , sensitivity of 7 uV/mm, display speed of 72mm/sec with a 60Hz  notched filter applied as appropriate. EEG data were recorded continuously and digitally stored.  Video monitoring was available and reviewed as appropriate. Description:  No clear posterior dominant rhythm was seen. EEG showed continuous 3 to 6 Hz theta-delta slowing in right posterior temporal. Additionally there was excessive amount of 15 to 18 Hz beta activity  distributed symmetrically and diffusely.  Photic driving was not seen during photic stimulation.  Hyperventilation was not performed.   ABNORMALITY - Continuous slow, right posterior temporal - Excessive Beta, generalized  IMPRESSION: This study is suggestive of cortical dysfunction arising from right posterior temporal region likely secondary to underlying structural abnormality. The excessive beta activity seen in the background is most likely due to the effect of benzodiazepine and is a benign EEG pattern. No seizures or epileptiform discharges were seen throughout the recording.  Please note lack of epileptiform activity during interictal eeg doesn't exclude diagnosis of epilepsy.  Charlsie Quest  DG Chest Port 1 View  Result Date: September 12, 2023 CLINICAL DATA:  Seizure and emesis EXAM: PORTABLE CHEST 1 VIEW COMPARISON:  04/02/2022 FINDINGS: Interstitial coarsening at the lung bases, greater on the left. Low volume chest with asymmetric elevation of the right diaphragm. No Kerley lines, effusion, or pneumothorax. Borderline heart size and stable mediastinal contours. Artifact from EKG leads. IMPRESSION: Interstitial coarsening at the bases which could be atelectasis or aspiration. Consider follow-up. Electronically Signed   By: Tiburcio Pea M.D.   On: September 12, 2023 05:03   CT HEAD WO CONTRAST ( )  Result Date: 2023/09/12 CLINICAL DATA:  Seizure disorder with clinical change EXAM: CT HEAD WITHOUT CONTRAST TECHNIQUE: Contiguous axial images were obtained from the base of the skull  through the vertex without intravenous contrast. RADIATION DOSE REDUCTION: This exam was performed according to the departmental dose-optimization program which includes automated exposure control, adjustment of the mA and/or kV according to patient size and/or use of iterative reconstruction technique. COMPARISON:  04/05/2023 FINDINGS: Brain: No evidence of acute infarction, hemorrhage, hydrocephalus, extra-axial collection or  mass lesion/mass effect. Generalized brain atrophy, greater than expected for age. Vascular: No hyperdense vessel or unexpected calcification. Skull: Normal. Negative for fracture or focal lesion. Sinuses/Orbits: No acute finding. IMPRESSION: 1. No acute finding. 2. Generalized brain atrophy. Electronically Signed   By: Tiburcio Pea M.D.   On: 08/18/2023 04:02     PERTINENT LAB RESULTS: CBC: Recent Labs    08/18/23 0225 08/19/23 0506  WBC 5.7 4.7  HGB 12.2 12.0  HCT 36.7 36.2  PLT 214 220   CMET CMP     Component Value Date/Time   NA 137 08/19/2023 0506   K 3.5 08/19/2023 0506   CL 107 08/19/2023 0506   CO2 20 (L) 08/19/2023 0506   GLUCOSE 82 08/19/2023 0506   BUN 5 (L) 08/19/2023 0506   CREATININE 0.59 08/19/2023 0506   CALCIUM 8.6 (L) 08/19/2023 0506   PROT 5.8 (L) 08/19/2023 0506   ALBUMIN 3.1 (L) 08/19/2023 0506   AST 22 08/19/2023 0506   ALT 17 08/19/2023 0506   ALKPHOS 54 08/19/2023 0506   BILITOT 0.6 08/19/2023 0506   GFRNONAA >60 08/19/2023 0506    GFR CrCl cannot be calculated (Unknown ideal weight.). No results for input(s): "LIPASE", "AMYLASE" in the last 72 hours. Recent Labs    08/18/23 0842  CKTOTAL 107   Invalid input(s): "POCBNP" No results for input(s): "DDIMER" in the last 72 hours. No results for input(s): "HGBA1C" in the last 72 hours. No results for input(s): "CHOL", "HDL", "LDLCALC", "TRIG", "CHOLHDL", "LDLDIRECT" in the last 72 hours. No results for input(s): "TSH", "T4TOTAL", "T3FREE", "THYROIDAB" in the last 72 hours.  Invalid input(s): "FREET3" No results for input(s): "VITAMINB12", "FOLATE", "FERRITIN", "TIBC", "IRON", "RETICCTPCT" in the last 72 hours. Coags: No results for input(s): "INR" in the last 72 hours.  Invalid input(s): "PT" Microbiology: No results found for this or any previous visit (from the past 240 hour(s)).  FURTHER DISCHARGE INSTRUCTIONS:  Get Medicines reviewed and adjusted: Please take all your medications  with you for your next visit with your Primary MD  Laboratory/radiological data: Please request your Primary MD to go over all hospital tests and procedure/radiological results at the follow up, please ask your Primary MD to get all Hospital records sent to his/her office.  In some cases, they will be blood work, cultures and biopsy results pending at the time of your discharge. Please request that your primary care M.D. goes through all the records of your hospital data and follows up on these results.  Also Note the following: If you experience worsening of your admission symptoms, develop shortness of breath, life threatening emergency, suicidal or homicidal thoughts you must seek medical attention immediately by calling 911 or calling your MD immediately  if symptoms less severe.  You must read complete instructions/literature along with all the possible adverse reactions/side effects for all the Medicines you take and that have been prescribed to you. Take any new Medicines after you have completely understood and accpet all the possible adverse reactions/side effects.   Do not drive when taking Pain medications or sleeping medications (Benzodaizepines)  Do not take more than prescribed Pain, Sleep and Anxiety Medications. It is not  advisable to combine anxiety,sleep and pain medications without talking with your primary care practitioner  Special Instructions: If you have smoked or chewed Tobacco  in the last 2 yrs please stop smoking, stop any regular Alcohol  and or any Recreational drug use.  Wear Seat belts while driving.  Please note: You were cared for by a hospitalist during your hospital stay. Once you are discharged, your primary care physician will handle any further medical issues. Please note that NO REFILLS for any discharge medications will be authorized once you are discharged, as it is imperative that you return to your primary care physician (or establish a relationship  with a primary care physician if you do not have one) for your post hospital discharge needs so that they can reassess your need for medications and monitor your lab values.  Total Time spent coordinating discharge including counseling, education and face to face time equals greater than 30 minutes.  Signed: Jerame Hedding 08/19/2023 11:11 AM

## 2023-08-19 NOTE — TOC Transition Note (Signed)
Transition of Care [facility]) - CM/SW Discharge Note   Patient Details  Name: [name] MRN: [id number] Date of Birth: 02/28/1956  Transition of Care [facility]) CM/SW Contact:  Gordy Clement, RN Phone Number: 08/19/2023, 12:30 PM   Clinical Narrative:     Patient will DC to home with family. No recs for PT or OT follow up  Family to transport  No additional TOC needs           Patient Goals and CMS Choice      Discharge Placement                         Discharge Plan and Services Additional resources added to the After Visit Summary for                                       Social Determinants of Health (SDOH) Interventions SDOH Screenings   Food Insecurity: Patient Unable To Answer (08/18/2023)  Housing: Patient Unable To Answer (08/18/2023)  Transportation Needs: Patient Unable To Answer (08/18/2023)  Utilities: Patient Unable To Answer (08/18/2023)  Alcohol Screen: Medium Risk (12/18/2018)  Depression (PHQ2-9): Medium Risk (05/10/2021)  Financial Resource Strain: Medium Risk (09/27/2018)  Physical Activity: Unknown (09/27/2018)  Social Connections: Unknown (09/27/2018)  Stress: No Stress Concern Present (09/27/2018)  Tobacco Use: High Risk (08/19/2023)     Readmission Risk Interventions     No data to display

## 2023-08-19 NOTE — Plan of Care (Signed)
Pt has rested quietly throughout the night with no distress noted. Alert and oriented to person and place. On room air. SR on the monitor. Foley intact to BSD. Medicated with ativan once with relief noted. Tylenol given for pain with relief noted. No other complaints voiced. No seizure activity noted.     Problem: Education: Goal: Knowledge of General Education information will improve Description: Including pain rating scale, medication(s)/side effects and non-pharmacologic comfort measures Outcome: Progressing   Problem: Health Behavior/Discharge Planning: Goal: Ability to manage health-related needs will improve Outcome: Progressing   Problem: Clinical Measurements: Goal: Ability to maintain clinical measurements within normal limits will improve Outcome: Progressing Goal: Will remain free from infection Outcome: Progressing Goal: Diagnostic test results will improve Outcome: Progressing   Problem: Activity: Goal: Risk for activity intolerance will decrease Outcome: Progressing   Problem: Coping: Goal: Level of anxiety will decrease Outcome: Progressing   Problem: Elimination: Goal: Will not experience complications related to urinary retention Outcome: Progressing   Problem: Pain Management: Goal: General experience of comfort will improve Outcome: Progressing   Problem: Safety: Goal: Ability to remain free from injury will improve Outcome: Progressing   Problem: Skin Integrity: Goal: Risk for impaired skin integrity will decrease Outcome: Progressing

## 2023-08-19 NOTE — Evaluation (Signed)
Physical Therapy Evaluation Patient Details Name: [name] MRN: [id number] DOB: 02/28/1956 Today's Date: 08/19/2023  History of Present Illness  Pt is 67 yo presenting to [facility] ED by EMS after 3 episodse of witnessed seizure at home last night. PMH: Seizure disorder, ETOH withdrawal, pancreatitis, anxiety, depression and SAH.  Clinical Impression  Pt is presenting as close to baseline level of functioning at this time. Previously pt was Mod I with SPC to Ind with all functional activities and is demonstrating the same today for bed mobility, sit to stand and gait. Pt spouse is with her when she is out in the community and cooks meals due to fear of seizure. Due to pt current functional status, PLOF, home set up and available assist no recommended skilled physical therapy services at this time. Pt will be discharged from acute care skilled physical therapy; please re-consult if further needs arise.         If plan is discharge home, recommend the following: Assistance with cooking/housework;Assist for transportation     Equipment Recommendations None recommended by PT     Functional Status Assessment Patient has not had a recent decline in their functional status     Precautions / Restrictions Precautions Precautions: Fall Restrictions Weight Bearing Restrictions: No      Mobility  Bed Mobility Overal bed mobility: Independent    Transfers Overall transfer level: Modified independent Equipment used: None      Ambulation/Gait Ambulation/Gait assistance: Modified independent (Device/Increase time) Gait Distance (Feet): 150 Feet Assistive device: None Gait Pattern/deviations: WFL(Within Functional Limits) Gait velocity: decreased Gait velocity interpretation: 1.31 - 2.62 ft/sec, indicative of limited community ambulator   General Gait Details: slight frontal plane sway, no LOB, heavy foot falls on the L  Stairs Stairs:  (not applicable)              Balance Overall balance assessment: Mild deficits observed, not formally tested         Pertinent Vitals/Pain Pain Assessment Pain Assessment: 0-10 Pain Score: 10-Worst pain ever Pain Location: all over Pain Descriptors / Indicators: Aching Pain Intervention(s): Monitored during session, Patient requesting pain meds-RN notified    Home Living Family/patient expects to be discharged to:: Other (Comment) (homestead Sales executive) Living Arrangements: Spouse/significant other       Additional Comments: [facility] - has lived there with [name]; both drink; level entry to [employer]; owns [facility] only    Prior Function Prior Level of Function : Independent/Modified Independent       Mobility Comments: Uses SPC outside, holds onto furniture and walls inside ADLs Comments: Per pt, independent in ADL, husband performs IADLs within the home. Husband always with pt in community setting (taking the bus, uber, Catering manager)     Extremity/Trunk Assessment   Upper Extremity Assessment Upper Extremity Assessment: Overall WFL for tasks assessed    Lower Extremity Assessment Lower Extremity Assessment: Overall WFL for tasks assessed    Cervical / Trunk Assessment Cervical / Trunk Assessment: Normal  Communication   Communication Communication: No apparent difficulties Cueing Techniques: Verbal cues  Cognition Arousal: Alert Behavior During Therapy: WFL for tasks assessed/performed Overall Cognitive Status: Within Functional Limits for tasks assessed        General Comments General comments (skin integrity, edema, etc.): No noted hematomas or lacerations outside of gown        Assessment/Plan    PT Assessment Patient does not need any further PT services         PT Goals (Current  goals can be found in the Care Plan section)  Acute Rehab PT Goals PT Goal Formulation: All assessment and education complete, DC therapy            AM-PAC PT "6 Clicks" Mobility  Outcome Measure  Help needed turning from your back to your side while in a flat bed without using bedrails?: None Help needed moving from lying on your back to sitting on the side of a flat bed without using bedrails?: None Help needed moving to and from a bed to a chair (including a wheelchair)?: None Help needed standing up from a chair using your arms (e.g., wheelchair or bedside chair)?: None Help needed to walk in hospital room?: None Help needed climbing 3-5 steps with a railing? : A Little 6 Click Score: 23    End of Session Equipment Utilized During Treatment: Gait belt Activity Tolerance: Patient tolerated treatment well Patient left: in bed;with call bell/phone within reach;with bed alarm set Nurse Communication: Mobility status      Time: 0922-0934 PT Time Calculation (min) (ACUTE ONLY): 12 min   Charges:   PT Evaluation $PT Eval Low Complexity: 1 Low   PT General Charges $$ ACUTE PT VISIT: 1 Visit        Harrel Carina, DPT, CLT  Acute Rehabilitation Services Office: [phone] (Secure chat preferred)   Claudia Desanctis 08/19/2023, 11:18 AM

## 2023-09-25 ENCOUNTER — Observation Stay (HOSPITAL_COMMUNITY)
Admission: EM | Admit: 2023-09-25 | Discharge: 2023-09-28 | Disposition: A | Discharge status: Home / Self Care | Payer: 59 | Attending: Internal Medicine | Admitting: Internal Medicine

## 2023-09-25 ENCOUNTER — Emergency Department (HOSPITAL_COMMUNITY): Payer: 59

## 2023-09-25 ENCOUNTER — Observation Stay (HOSPITAL_COMMUNITY): Payer: 59

## 2023-09-25 ENCOUNTER — Encounter (HOSPITAL_COMMUNITY): Payer: Self-pay | Admitting: Internal Medicine

## 2023-09-25 ENCOUNTER — Other Ambulatory Visit: Payer: Self-pay

## 2023-09-25 DIAGNOSIS — G40909 Epilepsy, unspecified, not intractable, without status epilepticus: Principal | ICD-10-CM | POA: Insufficient documentation

## 2023-09-25 DIAGNOSIS — F17210 Nicotine dependence, cigarettes, uncomplicated: Secondary | ICD-10-CM | POA: Insufficient documentation

## 2023-09-25 DIAGNOSIS — E871 Hypo-osmolality and hyponatremia: Secondary | ICD-10-CM | POA: Diagnosis not present

## 2023-09-25 DIAGNOSIS — R262 Difficulty in walking, not elsewhere classified: Secondary | ICD-10-CM | POA: Insufficient documentation

## 2023-09-25 DIAGNOSIS — R569 Unspecified convulsions: Secondary | ICD-10-CM | POA: Diagnosis not present

## 2023-09-25 DIAGNOSIS — G9341 Metabolic encephalopathy: Principal | ICD-10-CM | POA: Insufficient documentation

## 2023-09-25 LAB — CBC WITH DIFFERENTIAL/PLATELET
Abs Immature Granulocytes: 0.05 10*3/uL (ref 0.00–0.07)
Basophils Absolute: 0 10*3/uL (ref 0.0–0.1)
Basophils Relative: 0 %
Eosinophils Absolute: 0 10*3/uL (ref 0.0–0.5)
Eosinophils Relative: 0 %
HCT: 42.1 % (ref 36.0–46.0)
Hemoglobin: 14.4 g/dL (ref 12.0–15.0)
Immature Granulocytes: 1 %
Lymphocytes Relative: 5 %
Lymphs Abs: 0.5 10*3/uL — ABNORMAL LOW (ref 0.7–4.0)
MCH: 32.8 pg (ref 26.0–34.0)
MCHC: 34.2 g/dL (ref 30.0–36.0)
MCV: 95.9 fL (ref 80.0–100.0)
Monocytes Absolute: 0.4 10*3/uL (ref 0.1–1.0)
Monocytes Relative: 4 %
Neutro Abs: 9.4 10*3/uL — ABNORMAL HIGH (ref 1.7–7.7)
Neutrophils Relative %: 90 %
Platelets: 245 10*3/uL (ref 150–400)
RBC: 4.39 MIL/uL (ref 3.87–5.11)
RDW: 12.7 % (ref 11.5–15.5)
WBC: 10.4 10*3/uL (ref 4.0–10.5)
nRBC: 0 % (ref 0.0–0.2)

## 2023-09-25 LAB — URINALYSIS, ROUTINE W REFLEX MICROSCOPIC
Bilirubin Urine: NEGATIVE
Glucose, UA: NEGATIVE mg/dL
Ketones, ur: 20 mg/dL — AB
Leukocytes,Ua: NEGATIVE
Nitrite: NEGATIVE
Protein, ur: 100 mg/dL — AB
Specific Gravity, Urine: 1.02 (ref 1.005–1.030)
pH: 6 (ref 5.0–8.0)

## 2023-09-25 LAB — CK: Total CK: 134 U/L (ref 38–234)

## 2023-09-25 LAB — COMPREHENSIVE METABOLIC PANEL
ALT: 28 U/L (ref 0–44)
AST: 44 U/L — ABNORMAL HIGH (ref 15–41)
Albumin: 4.2 g/dL (ref 3.5–5.0)
Alkaline Phosphatase: 65 U/L (ref 38–126)
Anion gap: 23 — ABNORMAL HIGH (ref 5–15)
BUN: 15 mg/dL (ref 8–23)
CO2: 21 mmol/L — ABNORMAL LOW (ref 22–32)
Calcium: 9.4 mg/dL (ref 8.9–10.3)
Chloride: 86 mmol/L — ABNORMAL LOW (ref 98–111)
Creatinine, Ser: 1.02 mg/dL — ABNORMAL HIGH (ref 0.44–1.00)
GFR, Estimated: >60 mL/min (ref >60)
Glucose, Bld: 169 mg/dL — ABNORMAL HIGH (ref 70–99)
Potassium: 3.3 mmol/L — ABNORMAL LOW (ref 3.5–5.1)
Sodium: 130 mmol/L — ABNORMAL LOW (ref 135–145)
Total Bilirubin: 1.9 mg/dL — ABNORMAL HIGH (ref <1.2)
Total Protein: 7.5 g/dL (ref 6.5–8.1)

## 2023-09-25 LAB — MAGNESIUM: Magnesium: 2.3 mg/dL (ref 1.7–2.4)

## 2023-09-25 LAB — ETHANOL: Alcohol, Ethyl (B): <10 mg/dL (ref <10)

## 2023-09-25 LAB — CBG MONITORING, ED: Glucose-Capillary: 193 mg/dL — ABNORMAL HIGH (ref 70–99)

## 2023-09-25 MED ORDER — ACETAMINOPHEN 650 MG RE SUPP: 650.0000 mg | Freq: Four times a day (QID) | RECTAL | Status: DC | PRN

## 2023-09-25 MED ORDER — POTASSIUM CHLORIDE CRYS ER 20 MEQ PO TBCR
40.0000 meq | EXTENDED_RELEASE_TABLET | Freq: Once | ORAL | Status: AC
  Administered 2023-09-25: 40 meq via ORAL
  Filled 2023-09-25: qty 2

## 2023-09-25 MED ORDER — PANTOPRAZOLE SODIUM 40 MG PO TBEC
40.0000 mg | DELAYED_RELEASE_TABLET | Freq: Every day | ORAL | Status: DC
  Administered 2023-09-25 – 2023-09-28 (×4): 40 mg via ORAL
  Filled 2023-09-25 (×4): qty 1

## 2023-09-25 MED ORDER — ONDANSETRON HCL 4 MG/2ML IJ SOLN: 4.0000 mg | Freq: Four times a day (QID) | INTRAMUSCULAR | Status: DC | PRN

## 2023-09-25 MED ORDER — GABAPENTIN 300 MG PO CAPS
300.0000 mg | ORAL_CAPSULE | Freq: Three times a day (TID) | ORAL | Status: DC
Start: 2023-09-25 — End: 2023-09-28
  Administered 2023-09-25 – 2023-09-28 (×9): 300 mg via ORAL
  Filled 2023-09-25 (×9): qty 1

## 2023-09-25 MED ORDER — ACETAMINOPHEN 325 MG PO TABS
650.0000 mg | ORAL_TABLET | Freq: Four times a day (QID) | ORAL | Status: DC | PRN
  Administered 2023-09-26 (×2): 650 mg via ORAL
  Filled 2023-09-25 (×2): qty 2

## 2023-09-25 MED ORDER — ENOXAPARIN SODIUM 40 MG/0.4ML IJ SOSY
40.0000 mg | PREFILLED_SYRINGE | INTRAMUSCULAR | Status: DC
  Administered 2023-09-25 – 2023-09-27 (×3): 40 mg via SUBCUTANEOUS
  Filled 2023-09-25 (×3): qty 0.4

## 2023-09-25 MED ORDER — SODIUM CHLORIDE 0.9 % IV SOLN
200.0000 mg | Freq: Once | INTRAVENOUS | Status: AC
  Administered 2023-09-25: 200 mg via INTRAVENOUS
  Filled 2023-09-25: qty 20

## 2023-09-25 MED ORDER — ONDANSETRON HCL 4 MG PO TABS: 4.0000 mg | ORAL_TABLET | Freq: Four times a day (QID) | ORAL | Status: DC | PRN

## 2023-09-25 MED ORDER — THIAMINE MONONITRATE 100 MG PO TABS
100.0000 mg | ORAL_TABLET | Freq: Every day | ORAL | Status: DC
Start: 2023-09-25 — End: 2023-09-28
  Administered 2023-09-25 – 2023-09-28 (×4): 100 mg via ORAL
  Filled 2023-09-25 (×4): qty 1

## 2023-09-25 MED ORDER — LEVETIRACETAM 500 MG PO TABS
500.0000 mg | ORAL_TABLET | Freq: Two times a day (BID) | ORAL | Status: DC
  Administered 2023-09-25 – 2023-09-28 (×6): 500 mg via ORAL
  Filled 2023-09-25 (×6): qty 1

## 2023-09-25 MED ORDER — VITAMIN B-12 1000 MCG PO TABS
1000.0000 ug | ORAL_TABLET | Freq: Every day | ORAL | Status: DC
  Administered 2023-09-25 – 2023-09-28 (×4): 1000 ug via ORAL
  Filled 2023-09-25 (×4): qty 1

## 2023-09-25 MED ORDER — LACOSAMIDE 50 MG PO TABS
50.0000 mg | ORAL_TABLET | Freq: Two times a day (BID) | ORAL | Status: DC
  Administered 2023-09-25 – 2023-09-28 (×6): 50 mg via ORAL
  Filled 2023-09-25 (×6): qty 1

## 2023-09-25 NOTE — ED Notes (Signed)
EEG at bedside.

## 2023-09-25 NOTE — Consult Note (Signed)
NEUROLOGY CONSULT NOTE   Date of service: September 25, 2023 Patient Name: [name] MRN:  [id number] DOB:  09-04-1956 Chief Complaint: "seizure" Requesting Provider: Joseph Art, DO  History of Present Illness  [name] is a 67 y.o. female  has a past medical history of Alcohol dependence (HCC), Anxiety, Chronic pain, Depression, Diverticulitis, Herniated cervical disc, Pancreatitis, and Seizures (HCC). who presents after a seizure. She is a known epilepsy patient current prescribed keppra and vimpat. She initially states she is not taking her medication, but then states she does get her medicine from the hospital. Of note her last refill was from [facility] pharmacy on 10/31.  Patient had 2 seizures on this by her husband this morning.  Patient told the primary team that she has been sharing her medications however she did not admit that to me.  She did state that she has been taking her medications.  It is reported that she did hit her head on the toilet during her first seizure.  Received Vimpat 200 mg IV in the ER.  Keppra 500 mg scheduled for today.  On attending MD evaluation she does report her husband is taking some of her seizure medications because he runs out of his.  She also reports she stopped drinking 3 days ago because she decided she does not want to drink anymore  Current AEDs -Keppra 500 mg twice daily -Gabapentin 300 mg 3 times daily -Vimpat 50 mg twice daily  ROS   Unable to ascertain due to altered mental status   Past History   Past Medical History:  Diagnosis Date   Alcohol dependence (HCC)    Anxiety    Chronic pain    Depression    Diverticulitis    Herniated cervical disc    Pancreatitis    Seizures (HCC)    alcoholic seizures    Past Surgical History:  Procedure Laterality Date   ABDOMINAL HYSTERECTOMY     ANKLE SURGERY Right    CARPAL TUNNEL RELEASE Bilateral    CERVICAL FUSION     CHOLECYSTECTOMY     KNEE SURGERY       Family History: Family History  Family history unknown: Yes    Social History  reports that she has been smoking cigarettes. She has a 1.8 pack-year smoking history. She has never used smokeless tobacco. She reports current alcohol use. She reports that she does not currently use drugs after having used the following drugs: Marijuana.  Allergies  Allergen Reactions   Keflex [Cephalexin] Hives   Cephalosporins Hives   Prednisone Swelling   Toradol [Ketorolac Tromethamine] Hives   Ultram [Tramadol] Hives    Medications   Current Facility-Administered Medications:    acetaminophen (TYLENOL) tablet 650 mg, 650 mg, Oral, Q6H PRN **OR** acetaminophen (TYLENOL) suppository 650 mg, 650 mg, Rectal, Q6H PRN, Vann, Jessica U, DO   enoxaparin (LOVENOX) injection 40 mg, 40 mg, Subcutaneous, Q24H, Vann, Jessica U, DO, 40 mg at 09/25/23 1653   gabapentin (NEURONTIN) capsule 300 mg, 300 mg, Oral, TID, Vann, Jessica U, DO, 300 mg at 09/25/23 1652   lacosamide (VIMPAT) tablet 50 mg, 50 mg, Oral, BID, Vann, Jessica U, DO   levETIRAcetam (KEPPRA) tablet 500 mg, 500 mg, Oral, BID, Vann, Jessica U, DO   ondansetron (ZOFRAN) tablet 4 mg, 4 mg, Oral, Q6H PRN **OR** ondansetron (ZOFRAN) injection 4 mg, 4 mg, Intravenous, Q6H PRN, Vann, Jessica U, DO   pantoprazole (PROTONIX) EC tablet 40 mg, 40 mg, Oral, Daily,  Marlin Canary U, DO, 40 mg at 10-16-23 1652  Current Outpatient Medications:    gabapentin (NEURONTIN) 300 MG capsule, Take 1 capsule (300 mg total) by mouth 3 (three) times daily., Disp: 90 capsule, Rfl: 2   lacosamide (VIMPAT) 50 MG TABS tablet, Take 1 tablet (50 mg total) by mouth 2 (two) times daily., Disp: 60 tablet, Rfl: 2   levETIRAcetam (KEPPRA) 500 MG tablet, Take 1 tablet (500 mg total) by mouth 2 (two) times daily., Disp: 60 tablet, Rfl: 2   pantoprazole (PROTONIX) 40 MG tablet, Take 1 tablet (40 mg total) by mouth daily., Disp: 30 tablet, Rfl: 1  Vitals   Vitals:   October 16, 2023 1245  Oct 16, 2023 1415 10/16/2023 1425 10-16-23 1545  BP: 121/79 139/73    Pulse: 84 87  90  Resp: 18 19  19   Temp:   98.7 F (37.1 C)   TempSrc:   Oral   SpO2: 95% 96%  99%    There is no height or weight on file to calculate BMI.  Physical Exam   Constitutional: Chronically ill, appears older than stated age Psych: Flat affect Eyes: No scleral injection.  HENT: No OP obstruction.  Head: Normocephalic.  Cardiovascular: Normal rate and regular rhythm.  Respiratory: Effort normal, non-labored breathing.  GI: Soft.  No distension. There is no tenderness.  Skin: WDI.   Neurologic Examination   Neuro: Mental Status: Patient is awake, alert, oriented to person, place, states its October 10, 1964.  States she is in the hospital because she had a seizure  Speech is dysarthric Cranial Nerves: II: Visual Fields are full. Pupils are equal, round, and reactive to light.   III,IV, VI: Gaze is disconjugate, able to cross midline V: Facial sensation is symmetric to temperature VII: Facial movement is symmetric resting and smiling VIII: Hearing is intact to voice X: Palate elevates symmetrically XI: Shoulder shrug is symmetric. XII: Tongue protrudes midline without atrophy or fasciculations.  Motor: Tone is normal. Bulk is normal.  Moves all extremities antigravity, pain limited Sensory: Sensation is symmetric to light touch and temperature in the arms and legs.. Cerebellar: FNF and HKS are intact bilaterally    Labs/Imaging/Neurodiagnostic studies   CBC:  Recent Labs  Lab 10-16-2023 1058  WBC 10.4  NEUTROABS 9.4*  HGB 14.4  HCT 42.1  MCV 95.9  PLT 245    Basic Metabolic Panel:  Lab Results  Component Value Date   NA 130 (L) October 16, 2023   K 3.3 (L) 2023-10-16   CO2 21 (L) 16-Oct-2023   GLUCOSE 169 (H) 10-16-2023   BUN 15 10/16/2023   CREATININE 1.02 (H) 16-Oct-2023   CALCIUM 9.4 2023/10/16   GFRNONAA >60 2023/10/16   GFRAA >60 06/09/2020    Lipid Panel:  Lab Results   Component Value Date   LDLCALC 58 06/09/2020    HgbA1c:  Lab Results  Component Value Date   HGBA1C 5.0 05/17/2022    Urine Drug Screen:     Component Value Date/Time   LABOPIA NONE DETECTED 08/18/2023 1329   COCAINSCRNUR NONE DETECTED 08/18/2023 1329   LABBENZ POSITIVE (A) 08/18/2023 1329   AMPHETMU NONE DETECTED 08/18/2023 1329   THCU NONE DETECTED 08/18/2023 1329   LABBARB NONE DETECTED 08/18/2023 1329     Alcohol Level     Component Value Date/Time   ETH <10 Oct 16, 2023 1059    INR  Lab Results  Component Value Date   INR 1.0 05/17/2022    APTT  Lab Results  Component Value Date  APTT 22 (L) 05/17/2022    AED levels:  Lab Results  Component Value Date   PHENYTOIN <2.5 (L) 10/17/2022   LEVETIRACETA <2.0 (L) 10/17/2022      CT Head and cervical spine without contrast(Personally reviewed): - No acute intracranial abnormalities. - No fracture or acute finding.  Neurodiagnostics rEEG:  Pending  ASSESSMENT   Chinara Kristia Sherratt is a 67 y.o. female  has a past medical history of Alcohol dependence (HCC), Anxiety, Chronic pain, Depression, Diverticulitis, Herniated cervical disc, Pancreatitis, and Seizures (HCC).  Who presents after a seizure.  She does have a history of noncompliance with her medications.  She did state to the primary team that she is sharing her medications. Per her last refill she should have been out of medications at the end of November.  At this time she is still confused, however she is able to move all extremities and engage in conversation.  She was given Vimpat.  Keppra level pending  While mild confusion can be seen with nonconvulsive status epilepticus, this is her typical presentation  Certainly frequent seizures, ongoing alcohol abuse puts her at risk for dementia including reversible causes of dementia such as thiamine and B12 deficiency.  Empirically treat for deficiencies  RECOMMENDATIONS  -EEG pending -Continue  gabapentin 300 g 3 times daily, Keppra 500 mg twice daily and Vimpat 50 mg twice daily per home medication -Empiric thiamine 100 mg daily and B12 1000 mcg daily due to alcohol use history -CIWA protocol to be ordered by primary team, appreciate management of other comorbidities per primary team -Monitor for return to baseline -Seizure precautions should be reviewed with patient as her mental status improves and included in discharge instructions -Inpatient neurology will follow-up EEG report but otherwise we will sign off at this time.  Please reach out with any questions or concerns including if patient has concern for further seizure activity, or is not returning to baseline  Standard seizure precautions: Per [facility] statutes, patients with seizures are not allowed to drive until  they have been seizure-free for six months. Use caution when using heavy equipment or power tools. Avoid working on ladders or at heights. Take showers instead of baths. Ensure the water temperature is not too high on the home water heater. Do not go swimming alone. When caring for infants or small children, sit down when holding, feeding, or changing them to minimize risk of injury to the child in the event you have a seizure.  To reduce risk of seizures, maintain good sleep hygiene avoid alcohol and illicit drug use, take all anti-seizure medications as prescribed.   ______________________________________________________________________  -- Patient seen and examined by NP/APP with MD. MD to update note as needed.    Elmer Picker, DNP, FNP-BC [facility] Pager: [phone]  Attending Neurologist's note:  I personally saw this patient, gathering history, performing a full neurologic examination, reviewing relevant labs, personally reviewing relevant imaging including head CT, and formulated the assessment and plan, adding the note above for completeness and clarity to accurately reflect my  thoughts  Brooke Dare MD-PhD [facility] [phone] Available 7 AM to 7 PM, outside these hours please contact Neurologist on call listed on AMION

## 2023-09-25 NOTE — ED Provider Notes (Signed)
[place] EMERGENCY DEPARTMENT AT [facility] Provider Note   CSN: [id number] Arrival date & time: 09/25/23  1020     History  Chief Complaint  Patient presents with   Seizures    Katlen Dellah Tisler is a 67 y.o. female.  Pt is a 67 yo female with pmhx significant for seizure d/o, etoh abuse, anxiety, depression, and hx SAH.  EMS was called after the husband witnessed 2 seizures.  Pt did fall from the toilet with her first seizure.  We don't know if she hit her head.  Pt was admitted from 10/30-31 with seizures.  She was d/c with keppra and vimpat.  EMS said he saw an empty vimpat bottle.  It is unclear when it was filled.  Pt does have a hx of noncompliance.  Pt is post-ictal and unable to contribute to hx.       Home Medications Prior to Admission medications   Medication Sig Start Date End Date Taking? Authorizing Provider  gabapentin (NEURONTIN) 300 MG capsule Take 1 capsule (300 mg total) by mouth 3 (three) times daily. 08/19/23   Ghimire, Werner Lean, MD  lacosamide (VIMPAT) 50 MG TABS tablet Take 1 tablet (50 mg total) by mouth 2 (two) times daily. 08/19/23   Ghimire, Werner Lean, MD  levETIRAcetam (KEPPRA) 500 MG tablet Take 1 tablet (500 mg total) by mouth 2 (two) times daily. 08/19/23   Ghimire, Werner Lean, MD  pantoprazole (PROTONIX) 40 MG tablet Take 1 tablet (40 mg total) by mouth daily. 08/19/23 10/18/23  Ghimire, Werner Lean, MD  DULoxetine (CYMBALTA) 30 MG capsule Take 1 capsule (30 mg total) by mouth daily. Patient not taking: Reported on 09/01/2020 06/11/20 09/02/20  Money, [name], FNP  metoCLOPramide (REGLAN) 10 MG tablet Take 1 tablet (10 mg total) by mouth every 6 (six) hours as needed for nausea (nausea/headache). 04/06/19 05/24/19  Mesner, Barbara Cower, MD      Allergies    Keflex [cephalexin], Cephalosporins, Prednisone, Toradol [ketorolac tromethamine], and Ultram [tramadol]    Review of Systems   Review of Systems  All other systems reviewed and are  negative.   Physical Exam Updated Vital Signs BP 139/73   Pulse 87   Temp 98.7 F (37.1 C) (Oral)   Resp 19   SpO2 96%  Physical Exam Vitals and nursing note reviewed.  Constitutional:      Appearance: Normal appearance.  HENT:     Head: Normocephalic and atraumatic.     Right Ear: External ear normal.     Left Ear: External ear normal.     Nose: Nose normal.     Mouth/Throat:     Mouth: Mucous membranes are moist.     Pharynx: Oropharynx is clear.  Eyes:     Extraocular Movements: Extraocular movements intact.     Conjunctiva/sclera: Conjunctivae normal.     Pupils: Pupils are equal, round, and reactive to light.  Cardiovascular:     Rate and Rhythm: Normal rate and regular rhythm.     Pulses: Normal pulses.     Heart sounds: Normal heart sounds.  Pulmonary:     Effort: Pulmonary effort is normal.     Breath sounds: Normal breath sounds.  Abdominal:     General: Abdomen is flat. Bowel sounds are normal.     Palpations: Abdomen is soft.  Musculoskeletal:        General: Normal range of motion.     Cervical back: Normal range of motion and neck supple.  Skin:    General: Skin is warm.     Capillary Refill: Capillary refill takes less than 2 seconds.  Neurological:     General: No focal deficit present.     Mental Status: She is alert. She is disoriented.  Psychiatric:        Mood and Affect: Mood normal.        Behavior: Behavior normal.     ED Results / Procedures / Treatments   Labs (all labs ordered are listed, but only abnormal results are displayed) Labs Reviewed  COMPREHENSIVE METABOLIC PANEL - Abnormal; Notable for the following components:      Result Value   Sodium 130 (*)    Potassium 3.3 (*)    Chloride 86 (*)    CO2 21 (*)    Glucose, Bld 169 (*)    Creatinine, Ser 1.02 (*)    AST 44 (*)    Total Bilirubin 1.9 (*)    Anion gap 23 (*)    All other components within normal limits  CBC WITH DIFFERENTIAL/PLATELET - Abnormal; Notable for the  following components:   Neutro Abs 9.4 (*)    Lymphs Abs 0.5 (*)    All other components within normal limits  URINALYSIS, ROUTINE W REFLEX MICROSCOPIC - Abnormal; Notable for the following components:   APPearance CLOUDY (*)    Hgb urine dipstick SMALL (*)    Ketones, ur 20 (*)    Protein, ur 100 (*)    Bacteria, UA RARE (*)    All other components within normal limits  CBG MONITORING, ED - Abnormal; Notable for the following components:   Glucose-Capillary 193 (*)    All other components within normal limits  MAGNESIUM  ETHANOL    EKG EKG Interpretation Date/Time:  Saturday September 25 2023 10:32:37 EST Ventricular Rate:  93 PR Interval:  142 QRS Duration:  84 QT Interval:  381 QTC Calculation: 474 R Axis:   -60  Text Interpretation: Sinus rhythm Probable left atrial enlargement Left anterior fascicular block Borderline T abnormalities, anterior leads No significant change since last tracing Confirmed by Jacalyn Lefevre [phone]) on 09/25/2023 10:46:45 AM  Radiology CT Head Wo Contrast  Result Date: 09/25/2023 CLINICAL DATA:  Two witnessed seizures.  Fell off the toilet. EXAM: CT HEAD WITHOUT CONTRAST CT CERVICAL SPINE WITHOUT CONTRAST TECHNIQUE: Multidetector CT imaging of the head and cervical spine was performed following the standard protocol without intravenous contrast. Multiplanar CT image reconstructions of the cervical spine were also generated. RADIATION DOSE REDUCTION: This exam was performed according to the departmental dose-optimization program which includes automated exposure control, adjustment of the mA and/or kV according to patient size and/or use of iterative reconstruction technique. COMPARISON:  Head CT, 08/18/2023.  Cervical CT, 05/17/2022. FINDINGS: CT HEAD FINDINGS Brain: No evidence of acute infarction, hemorrhage, hydrocephalus, extra-axial collection or mass lesion/mass effect. Vascular: No hyperdense vessel or unexpected calcification. Skull: Normal.  Negative for fracture or focal lesion. Sinuses/Orbits: Globes and orbits are unremarkable. Sinuses are clear. Other: None. CT CERVICAL SPINE FINDINGS Alignment: Straightened cervical lordosis.  No spondylolisthesis. Skull base and vertebrae: No acute fracture. No primary bone lesion or focal pathologic process. Soft tissues and spinal canal: No prevertebral fluid or swelling. No visible canal hematoma. Disc levels: Previous anterior cervical spine fusion, C4 through C7. Orthopedic hardware well-seated and stable. Mild loss of disc height at C3-C4 with moderate loss of disc height at C7-T1. No evidence of a disc herniation. Upper chest: No acute or significant abnormality.  Other: None. IMPRESSION: HEAD CT 1. No acute intracranial abnormalities. CERVICAL CT 1. No fracture or acute finding. Electronically Signed   By: Amie Portland M.D.   On: 09/25/2023 12:16   CT Cervical Spine Wo Contrast  Result Date: 09/25/2023 CLINICAL DATA:  Two witnessed seizures.  Fell off the toilet. EXAM: CT HEAD WITHOUT CONTRAST CT CERVICAL SPINE WITHOUT CONTRAST TECHNIQUE: Multidetector CT imaging of the head and cervical spine was performed following the standard protocol without intravenous contrast. Multiplanar CT image reconstructions of the cervical spine were also generated. RADIATION DOSE REDUCTION: This exam was performed according to the departmental dose-optimization program which includes automated exposure control, adjustment of the mA and/or kV according to patient size and/or use of iterative reconstruction technique. COMPARISON:  Head CT, 08/18/2023.  Cervical CT, 05/17/2022. FINDINGS: CT HEAD FINDINGS Brain: No evidence of acute infarction, hemorrhage, hydrocephalus, extra-axial collection or mass lesion/mass effect. Vascular: No hyperdense vessel or unexpected calcification. Skull: Normal. Negative for fracture or focal lesion. Sinuses/Orbits: Globes and orbits are unremarkable. Sinuses are clear. Other: None. CT  CERVICAL SPINE FINDINGS Alignment: Straightened cervical lordosis.  No spondylolisthesis. Skull base and vertebrae: No acute fracture. No primary bone lesion or focal pathologic process. Soft tissues and spinal canal: No prevertebral fluid or swelling. No visible canal hematoma. Disc levels: Previous anterior cervical spine fusion, C4 through C7. Orthopedic hardware well-seated and stable. Mild loss of disc height at C3-C4 with moderate loss of disc height at C7-T1. No evidence of a disc herniation. Upper chest: No acute or significant abnormality. Other: None. IMPRESSION: HEAD CT 1. No acute intracranial abnormalities. CERVICAL CT 1. No fracture or acute finding. Electronically Signed   By: Amie Portland M.D.   On: 09/25/2023 12:16   DG Chest Port 1 View  Result Date: 09/25/2023 CLINICAL DATA:  Seizure. EXAM: PORTABLE CHEST 1 VIEW COMPARISON:  08/18/2023 FINDINGS: Heart size appears normal. Lung volumes are low. Opacity within the periphery of the left base may represent atelectasis, small effusion or airspace disease. Right lung appears clear. Status post ACDF. Chronic appearing impaction injury involving the greater tuberosity of the proximal right humerus. IMPRESSION: 1. Low lung volumes. 2. Opacity within the periphery of the left base may represent atelectasis, small effusion or airspace disease. Electronically Signed   By: Signa Kell M.D.   On: 09/25/2023 11:29    Procedures Procedures    Medications Ordered in ED Medications  enoxaparin (LOVENOX) injection 40 mg (has no administration in time range)  acetaminophen (TYLENOL) tablet 650 mg (has no administration in time range)    Or  acetaminophen (TYLENOL) suppository 650 mg (has no administration in time range)  ondansetron (ZOFRAN) tablet 4 mg (has no administration in time range)    Or  ondansetron (ZOFRAN) injection 4 mg (has no administration in time range)  gabapentin (NEURONTIN) capsule 300 mg (has no administration in time  range)  lacosamide (VIMPAT) tablet 50 mg (has no administration in time range)  levETIRAcetam (KEPPRA) tablet 500 mg (has no administration in time range)  pantoprazole (PROTONIX) EC tablet 40 mg (has no administration in time range)  potassium chloride SA (KLOR-CON M) CR tablet 40 mEq (has no administration in time range)  lacosamide (VIMPAT) 200 mg in sodium chloride 0.9 % 25 mL IVPB (0 mg Intravenous Stopped 09/25/23 1306)    ED Course/ Medical Decision Making/ A&P  Medical Decision Making Amount and/or Complexity of Data Reviewed Labs: ordered. Radiology: ordered.  Risk Decision regarding hospitalization.   This patient presents to the ED for concern of seizure, this involves an extensive number of treatment options, and is a complaint that carries with it a high risk of complications and morbidity.  The differential diagnosis includes noncompliance, breakthrough seizures, electrolyte abn, trauma   Co morbidities that complicate the patient evaluation  seizure d/o, etoh abuse, anxiety, depression, and hx SAH   Additional history obtained:  Additional history obtained from epic chart review External records from outside source obtained and reviewed including EMS report   Lab Tests:  I Ordered, and personally interpreted labs.  The pertinent results include:  cbc nl, etoh neg, ua neg for infection   Imaging Studies ordered:  I ordered imaging studies including cxr and ct head/c-spine  I independently visualized and interpreted imaging which showed  CXR: Low lung volumes.  2. Opacity within the periphery of the left base may represent  atelectasis, small effusion or airspace disease.   I agree with the radiologist interpretation   Cardiac Monitoring:  The patient was maintained on a cardiac monitor.  I personally viewed and interpreted the cardiac monitored which showed an underlying rhythm of: nsr   Medicines ordered and  prescription drug management:  I ordered medication including vimpat  for sz Reevaluation of the patient after these medicines showed that the patient improved I have reviewed the patients home medicines and have made adjustments as needed   Test Considered:  ct   Consultations Obtained:  I requested consultation with the hospitalist (Dr. Zenaida Niece),  and discussed lab and imaging findings as well as pertinent plan - she will admit Pt d/w Dr. Iver Nestle (neurology) who will see pt in consult   Problem List / ED Course:  Seizure:  no seizure activity here.  Pt given vimpat.  AMS has persisted.  Both phone numbers on chart are not working.  No family has called.  Pt will be admitted for ams.   Reevaluation:  After the interventions noted above, I reevaluated the patient and found that they have :stayed the same   Social Determinants of Health:  Lives at home   Dispostion:  After consideration of the diagnostic results and the patients response to treatment, I feel that the patent would benefit from admission.          Final Clinical Impression(s) / ED Diagnoses Final diagnoses:  Acute metabolic encephalopathy  Seizure disorder [facility])    Rx / DC Orders ED Discharge Orders     None         Jacalyn Lefevre, MD 09/25/23 1601

## 2023-09-25 NOTE — ED Notes (Signed)
Patient transported to CT 

## 2023-09-25 NOTE — Progress Notes (Signed)
EEG complete - results pending.  MB assisted with hookup.

## 2023-09-25 NOTE — ED Notes (Signed)
Awaiting Vimpat from main pharmacy

## 2023-09-25 NOTE — H&P (Signed)
History and Physical    [name] ZOX:[id number] DOB: 1956/04/06 DOA: 09/25/2023  I have briefly reviewed the patient's prior medical records in [facility]  PCP: Diamantina Providence, FNP  Patient coming from: home  Chief Complaint: seizure?  HPI: [name] is a 67 y.o. female with medical history significant of alcohol use, seizure d/o, medical non-compliance who was brought in the ER by EMS with reported seizures.  Per EMS patient had 2 seizures witnessed by her husband this AM.  Empty pill bottles seen by EMS and patient states she ran out of her medications as she has been sharing with "cowboy" .  I asked several times who "cowboy" was but patient unable to say.  It was reports that during her first seizure she hit her head on the toilet.     Family was called but no one was able to  be reached as all numbers were out of service.  Chart review shows a recent hospitalization from 10/30-10/31 for similar episode of non-compliance and breakthrough seizures.    IN the ER, labs shows low K, CT head normal, low Na.     Patient remains confused in the ER so hospitalists were asked to place in observation.    Review of Systems: unable to complete  Past Medical History:  Diagnosis Date   Alcohol dependence (HCC)    Anxiety    Chronic pain    Depression    Diverticulitis    Herniated cervical disc    Pancreatitis    Seizures (HCC)    alcoholic seizures    Past Surgical History:  Procedure Laterality Date   ABDOMINAL HYSTERECTOMY     ANKLE SURGERY Right    CARPAL TUNNEL RELEASE Bilateral    CERVICAL FUSION     CHOLECYSTECTOMY     KNEE SURGERY       reports that she has been smoking cigarettes. She has a 1.8 pack-year smoking history. She has never used smokeless tobacco. She reports current alcohol use. She reports that she does not currently use drugs after having used the following drugs: Marijuana.  Allergies  Allergen Reactions   Keflex  [Cephalexin] Hives   Cephalosporins Hives   Prednisone Swelling   Toradol [Ketorolac Tromethamine] Hives   Ultram [Tramadol] Hives    Family History  Family history unknown: Yes    Prior to Admission medications   Medication Sig Start Date End Date Taking? Authorizing Provider  gabapentin (NEURONTIN) 300 MG capsule Take 1 capsule (300 mg total) by mouth 3 (three) times daily. 08/19/23   Ghimire, Werner Lean, MD  lacosamide (VIMPAT) 50 MG TABS tablet Take 1 tablet (50 mg total) by mouth 2 (two) times daily. 08/19/23   Ghimire, Werner Lean, MD  levETIRAcetam (KEPPRA) 500 MG tablet Take 1 tablet (500 mg total) by mouth 2 (two) times daily. 08/19/23   Ghimire, Werner Lean, MD  pantoprazole (PROTONIX) 40 MG tablet Take 1 tablet (40 mg total) by mouth daily. 08/19/23 10/18/23  Ghimire, Werner Lean, MD  DULoxetine (CYMBALTA) 30 MG capsule Take 1 capsule (30 mg total) by mouth daily. Patient not taking: Reported on 09/01/2020 06/11/20 09/02/20  Money, [name], FNP  metoCLOPramide (REGLAN) 10 MG tablet Take 1 tablet (10 mg total) by mouth every 6 (six) hours as needed for nausea (nausea/headache). 04/06/19 05/24/19  Marily Memos, MD    Physical Exam: Vitals:   09/25/23 1038 09/25/23 1245 09/25/23 1415 09/25/23 1425  BP: 136/81 121/79 139/73   Pulse:  84 87   Resp:  18 19   Temp:    98.7 F (37.1 C)  TempSrc:    Oral  SpO2:  95% 96%       Constitutional: confused Eyes: PERRL, lids and conjunctivae normal ENMT: Mucous membranes are moist Neck: normal, supple, no masses, no thyromegaly Respiratory: clear to auscultation bilaterally, no wheezing, no crackles. Normal respiratory effort. No accessory muscle use.  Cardiovascular: Regular rate and rhythm, no murmurs / rubs / gallops. No extremity edema. 2+ pedal pulses.  Abdomen: no tenderness, no masses palpated. Bowel sounds positive.  Musculoskeletal: no clubbing / cyanosis. Normal muscle tone.  Skin: no rashes, lesions, ulcers. No  induration Neurologic: CN 2-12 grossly intact. Strength 5/5 in all 4.  Psychiatric: oriented to person/place-- confused about situation  Labs on Admission: I have personally reviewed following labs and imaging studies  CBC: Recent Labs  Lab 09/25/23 1058  WBC 10.4  NEUTROABS 9.4*  HGB 14.4  HCT 42.1  MCV 95.9  PLT 245   Basic Metabolic Panel: Recent Labs  Lab 09/25/23 1058  NA 130*  K 3.3*  CL 86*  CO2 21*  GLUCOSE 169*  BUN 15  CREATININE 1.02*  CALCIUM 9.4  MG 2.3   GFR: CrCl cannot be calculated (Unknown ideal weight.). Liver Function Tests: Recent Labs  Lab 09/25/23 1058  AST 44*  ALT 28  ALKPHOS 65  BILITOT 1.9*  PROT 7.5  ALBUMIN 4.2   No results for input(s): "LIPASE", "AMYLASE" in the last 168 hours. No results for input(s): "AMMONIA" in the last 168 hours. Coagulation Profile: No results for input(s): "INR", "PROTIME" in the last 168 hours. Cardiac Enzymes: No results for input(s): "CKTOTAL", "CKMB", "CKMBINDEX", "TROPONINI" in the last 168 hours. BNP (last 3 results) No results for input(s): "PROBNP" in the last 8760 hours. HbA1C: No results for input(s): "HGBA1C" in the last 72 hours. CBG: Recent Labs  Lab 09/25/23 1103  GLUCAP 193*   Lipid Profile: No results for input(s): "CHOL", "HDL", "LDLCALC", "TRIG", "CHOLHDL", "LDLDIRECT" in the last 72 hours. Thyroid Function Tests: No results for input(s): "TSH", "T4TOTAL", "FREET4", "T3FREE", "THYROIDAB" in the last 72 hours. Anemia Panel: No results for input(s): "VITAMINB12", "FOLATE", "FERRITIN", "TIBC", "IRON", "RETICCTPCT" in the last 72 hours. Urine analysis:    Component Value Date/Time   COLORURINE YELLOW 09/25/2023 1437   APPEARANCEUR CLOUDY (A) 09/25/2023 1437   LABSPEC 1.020 09/25/2023 1437   PHURINE 6.0 09/25/2023 1437   GLUCOSEU NEGATIVE 09/25/2023 1437   HGBUR SMALL (A) 09/25/2023 1437   BILIRUBINUR NEGATIVE 09/25/2023 1437   KETONESUR 20 (A) 09/25/2023 1437   PROTEINUR  100 (A) 09/25/2023 1437   UROBILINOGEN 0.2 07/14/2019 1451   NITRITE NEGATIVE 09/25/2023 1437   LEUKOCYTESUR NEGATIVE 09/25/2023 1437     Radiological Exams on Admission: CT Head Wo Contrast  Result Date: 09/25/2023 CLINICAL DATA:  Two witnessed seizures.  Fell off the toilet. EXAM: CT HEAD WITHOUT CONTRAST CT CERVICAL SPINE WITHOUT CONTRAST TECHNIQUE: Multidetector CT imaging of the head and cervical spine was performed following the standard protocol without intravenous contrast. Multiplanar CT image reconstructions of the cervical spine were also generated. RADIATION DOSE REDUCTION: This exam was performed according to the departmental dose-optimization program which includes automated exposure control, adjustment of the mA and/or kV according to patient size and/or use of iterative reconstruction technique. COMPARISON:  Head CT, 08/18/2023.  Cervical CT, 05/17/2022. FINDINGS: CT HEAD FINDINGS Brain: No evidence of acute infarction, hemorrhage, hydrocephalus, extra-axial collection or mass lesion/mass  effect. Vascular: No hyperdense vessel or unexpected calcification. Skull: Normal. Negative for fracture or focal lesion. Sinuses/Orbits: Globes and orbits are unremarkable. Sinuses are clear. Other: None. CT CERVICAL SPINE FINDINGS Alignment: Straightened cervical lordosis.  No spondylolisthesis. Skull base and vertebrae: No acute fracture. No primary bone lesion or focal pathologic process. Soft tissues and spinal canal: No prevertebral fluid or swelling. No visible canal hematoma. Disc levels: Previous anterior cervical spine fusion, C4 through C7. Orthopedic hardware well-seated and stable. Mild loss of disc height at C3-C4 with moderate loss of disc height at C7-T1. No evidence of a disc herniation. Upper chest: No acute or significant abnormality. Other: None. IMPRESSION: HEAD CT 1. No acute intracranial abnormalities. CERVICAL CT 1. No fracture or acute finding. Electronically Signed   By: Amie Portland M.D.   On: 09/25/2023 12:16   CT Cervical Spine Wo Contrast  Result Date: 09/25/2023 CLINICAL DATA:  Two witnessed seizures.  Fell off the toilet. EXAM: CT HEAD WITHOUT CONTRAST CT CERVICAL SPINE WITHOUT CONTRAST TECHNIQUE: Multidetector CT imaging of the head and cervical spine was performed following the standard protocol without intravenous contrast. Multiplanar CT image reconstructions of the cervical spine were also generated. RADIATION DOSE REDUCTION: This exam was performed according to the departmental dose-optimization program which includes automated exposure control, adjustment of the mA and/or kV according to patient size and/or use of iterative reconstruction technique. COMPARISON:  Head CT, 08/18/2023.  Cervical CT, 05/17/2022. FINDINGS: CT HEAD FINDINGS Brain: No evidence of acute infarction, hemorrhage, hydrocephalus, extra-axial collection or mass lesion/mass effect. Vascular: No hyperdense vessel or unexpected calcification. Skull: Normal. Negative for fracture or focal lesion. Sinuses/Orbits: Globes and orbits are unremarkable. Sinuses are clear. Other: None. CT CERVICAL SPINE FINDINGS Alignment: Straightened cervical lordosis.  No spondylolisthesis. Skull base and vertebrae: No acute fracture. No primary bone lesion or focal pathologic process. Soft tissues and spinal canal: No prevertebral fluid or swelling. No visible canal hematoma. Disc levels: Previous anterior cervical spine fusion, C4 through C7. Orthopedic hardware well-seated and stable. Mild loss of disc height at C3-C4 with moderate loss of disc height at C7-T1. No evidence of a disc herniation. Upper chest: No acute or significant abnormality. Other: None. IMPRESSION: HEAD CT 1. No acute intracranial abnormalities. CERVICAL CT 1. No fracture or acute finding. Electronically Signed   By: Amie Portland M.D.   On: 09/25/2023 12:16   DG Chest Port 1 View  Result Date: 09/25/2023 CLINICAL DATA:  Seizure. EXAM: PORTABLE CHEST  1 VIEW COMPARISON:  08/18/2023 FINDINGS: Heart size appears normal. Lung volumes are low. Opacity within the periphery of the left base may represent atelectasis, small effusion or airspace disease. Right lung appears clear. Status post ACDF. Chronic appearing impaction injury involving the greater tuberosity of the proximal right humerus. IMPRESSION: 1. Low lung volumes. 2. Opacity within the periphery of the left base may represent atelectasis, small effusion or airspace disease. Electronically Signed   By: Signa Kell M.D.   On: 09/25/2023 11:29      Assessment/Plan Principal Problem:   Seizures (HCC)    Breakthrough seizure/recurrent seizure: Likely due to noncompliance with AEDs  -says she ran out of medications as "Cowboy" was also taking -Resume home Keppra and Vimpat -Continue home gabapentin -Seizure precaution -EEG -neurology consulted in ER   H/o alcohol use -levels 0 here-- will need to confirm use with family   Confusion/delirium:  -unsure of baseline mental state    GERD -Continue PPI   Tobacco use disorder -encourage cessation  Hypokalemia -replete -Mg ok  Hyponatremia -trend    DVT prophylaxis: lovenox  Code Status: full (presumed)  Family Communication: unable to reach any family Disposition Plan: obs until baseline established Consults called: ER doc to speak with neurology     At the point of initial evaluation, it is my clinical opinion that admission for OBSERVATION is reasonable and necessary because the patient's presenting complaints in the context of their chronic conditions represent sufficient risk of deterioration or significant morbidity to constitute reasonable grounds for close observation in the hospital setting, but that the patient may be medically stable for discharge from the hospital within 24 to 48 hours.   Joseph Art [facility]   How to contact the [facility] Attending or Consulting provider 7A - 7P or covering  provider during after hours 7P -7A, for this patient?  Check the care team in [facility] and look for a) attending/consulting [facility] provider listed and b) the [facility] team listed Log into [email or web address] and use [facility]'s universal password to access. If you do not have the password, please contact the hospital operator. Locate the [facility] provider you are looking for under [facility] and page to a number that you can be directly reached. If you still have difficulty reaching the provider, please page the [facility] (Director on Call) for the Hospitalists listed on amion for assistance.  09/25/2023, 3:50 PM

## 2023-09-25 NOTE — ED Notes (Signed)
Seizure pads placed on patient bed.  

## 2023-09-25 NOTE — ED Triage Notes (Signed)
Pt BIB [facility] from home d/t seizures. Pt had 2 witnessed seizures by husband with 1hr in between. Hx seziures. Pt is on Lacosamide, but per paramedic . During the first seizure, she fell off the toilet. Not sure whether or not she had hit her head. Empty medication bottles noted by paramedic. GCS 12.

## 2023-09-26 DIAGNOSIS — G40909 Epilepsy, unspecified, not intractable, without status epilepticus: Secondary | ICD-10-CM | POA: Diagnosis not present

## 2023-09-26 DIAGNOSIS — R569 Unspecified convulsions: Secondary | ICD-10-CM

## 2023-09-26 LAB — BASIC METABOLIC PANEL
Anion gap: 15 (ref 5–15)
BUN: 12 mg/dL (ref 8–23)
CO2: 27 mmol/L (ref 22–32)
Calcium: 9.1 mg/dL (ref 8.9–10.3)
Chloride: 93 mmol/L — ABNORMAL LOW (ref 98–111)
Creatinine, Ser: 0.9 mg/dL (ref 0.44–1.00)
GFR, Estimated: >60 mL/min (ref >60)
Glucose, Bld: 96 mg/dL (ref 70–99)
Potassium: 3.4 mmol/L — ABNORMAL LOW (ref 3.5–5.1)
Sodium: 135 mmol/L (ref 135–145)

## 2023-09-26 LAB — CBC
HCT: 40.1 % (ref 36.0–46.0)
Hemoglobin: 13.7 g/dL (ref 12.0–15.0)
MCH: 33.1 pg (ref 26.0–34.0)
MCHC: 34.2 g/dL (ref 30.0–36.0)
MCV: 96.9 fL (ref 80.0–100.0)
Platelets: 196 10*3/uL (ref 150–400)
RBC: 4.14 MIL/uL (ref 3.87–5.11)
RDW: 12.6 % (ref 11.5–15.5)
WBC: 9.1 10*3/uL (ref 4.0–10.5)
nRBC: 0 % (ref 0.0–0.2)

## 2023-09-26 MED ORDER — NYSTATIN 100000 UNIT/ML MT SUSP
5.0000 mL | Freq: Four times a day (QID) | OROMUCOSAL | Status: DC
  Administered 2023-09-26 – 2023-09-28 (×8): 500000 [IU] via OROMUCOSAL
  Filled 2023-09-26 (×8): qty 5

## 2023-09-26 MED ORDER — POTASSIUM CHLORIDE CRYS ER 20 MEQ PO TBCR
40.0000 meq | EXTENDED_RELEASE_TABLET | Freq: Once | ORAL | Status: AC
  Administered 2023-09-26: 40 meq via ORAL
  Filled 2023-09-26: qty 2

## 2023-09-26 NOTE — ED Notes (Signed)
ED TO INPATIENT HANDOFF REPORT  ED Nurse Name and Phone #: [phone]  S Name/Age/Gender [name] 67 y.o. female Room/Bed: 040C/040C  Code Status   Code Status: Full Code  Home/SNF/Other Home Patient oriented to: self, place, time, and situation Is this baseline? Yes   Triage Complete: Triage complete  Chief Complaint Seizures (HCC) [R56.9] Seizure (HCC) [R56.9]  Triage Note Pt BIB [facility] from home d/t seizures. Pt had 2 witnessed seizures by husband with 1hr in between. Hx seziures. Pt is on Lacosamide, but per paramedic . During the first seizure, she fell off the toilet. Not sure whether or not she had hit her head. Empty medication bottles noted by paramedic. GCS 12.    Allergies Allergies  Allergen Reactions   Keflex [Cephalexin] Hives   Cephalosporins Hives   Prednisone Swelling   Toradol [Ketorolac Tromethamine] Hives   Ultram [Tramadol] Hives    Level of Care/Admitting Diagnosis ED Disposition     ED Disposition  Admit   Condition  --   Comment  Hospital Area: [facility] [[id number]]  Level of Care: Med-Surg [16]  May place patient in observation at [facility] or [facility] if equivalent level of care is available:: Yes  Covid Evaluation: Asymptomatic - no recent exposure (last 10 days) testing not required  Diagnosis: Seizure [facility]) [[id number]]  Admitting Physician: Dorcas Carrow [[id number]]  Attending Physician: Dorcas Carrow [[id number]]          B Medical/Surgery History Past Medical History:  Diagnosis Date   Alcohol dependence (HCC)    Anxiety    Chronic pain    Depression    Diverticulitis    Herniated cervical disc    Pancreatitis    Seizures (HCC)    alcoholic seizures   Past Surgical History:  Procedure Laterality Date   ABDOMINAL HYSTERECTOMY     ANKLE SURGERY Right    CARPAL TUNNEL RELEASE Bilateral    CERVICAL FUSION     CHOLECYSTECTOMY     KNEE SURGERY       A IV  Location/Drains/Wounds Patient Lines/Drains/Airways Status     Active Line/Drains/Airways     Name Placement date Placement time Site Days   Peripheral IV 09/25/23 20 G Anterior;Proximal;Right Forearm 09/25/23  1058  Forearm  1            Intake/Output Last 24 hours No intake or output data in the 24 hours ending 09/26/23 1159  Labs/Imaging Results for orders placed or performed during the hospital encounter of 09/25/23 (from the past 48 hour(s))  Comprehensive metabolic panel     Status: Abnormal   Collection Time: 09/25/23 10:58 AM  Result Value Ref Range   Sodium 130 (L) 135 - 145 mmol/L   Potassium 3.3 (L) 3.5 - 5.1 mmol/L   Chloride 86 (L) 98 - 111 mmol/L   CO2 21 (L) 22 - 32 mmol/L   Glucose, Bld 169 (H) 70 - 99 mg/dL    Comment: Glucose reference range applies only to samples taken after fasting for at least 8 hours.   BUN 15 8 - 23 mg/dL   Creatinine, Ser 4.69 (H) 0.44 - 1.00 mg/dL   Calcium 9.4 8.9 - 62.9 mg/dL   Total Protein 7.5 6.5 - 8.1 g/dL   Albumin 4.2 3.5 - 5.0 g/dL   AST 44 (H) 15 - 41 U/L   ALT 28 0 - 44 U/L   Alkaline Phosphatase 65 38 - 126 U/L   Total Bilirubin 1.9 (  H) <1.2 mg/dL   GFR, Estimated >84 >69 mL/min    Comment: (NOTE) Calculated using the CKD-EPI Creatinine Equation (2021)    Anion gap 23 (H) 5 - 15    Comment: ELECTROLYTES REPEATED TO VERIFY Performed at [facility], [address] [place], [place] [address]   CBC with Differential/Platelet     Status: Abnormal   Collection Time: 09/25/23 10:58 AM  Result Value Ref Range   WBC 10.4 4.0 - 10.5 K/uL   RBC 4.39 3.87 - 5.11 MIL/uL   Hemoglobin 14.4 12.0 - 15.0 g/dL   HCT 84.1 32.4 - 40.1 %   MCV 95.9 80.0 - 100.0 fL   MCH 32.8 26.0 - 34.0 pg   MCHC 34.2 30.0 - 36.0 g/dL   RDW 02.7 25.3 - 66.4 %   Platelets 245 150 - 400 K/uL   nRBC 0.0 0.0 - 0.2 %   Neutrophils Relative % 90 %   Neutro Abs 9.4 (H) 1.7 - 7.7 K/uL   Lymphocytes Relative 5 %   Lymphs Abs 0.5 (L) 0.7 -  4.0 K/uL   Monocytes Relative 4 %   Monocytes Absolute 0.4 0.1 - 1.0 K/uL   Eosinophils Relative 0 %   Eosinophils Absolute 0.0 0.0 - 0.5 K/uL   Basophils Relative 0 %   Basophils Absolute 0.0 0.0 - 0.1 K/uL   Immature Granulocytes 1 %   Abs Immature Granulocytes 0.05 0.00 - 0.07 K/uL    Comment: Performed at [facility], [address] [place], [place] [address]  Magnesium     Status: None   Collection Time: 09/25/23 10:58 AM  Result Value Ref Range   Magnesium 2.3 1.7 - 2.4 mg/dL    Comment: Performed at [facility], [address] [place], [place] [address]  Ethanol     Status: None   Collection Time: 09/25/23 10:59 AM  Result Value Ref Range   Alcohol, Ethyl (B) <10 <10 mg/dL    Comment: (NOTE) Lowest detectable limit for serum alcohol is 10 mg/dL.  For medical purposes only. Performed at [facility], [address] [place], [place] [address]   CBG monitoring, ED     Status: Abnormal   Collection Time: 09/25/23 11:03 AM  Result Value Ref Range   Glucose-Capillary 193 (H) 70 - 99 mg/dL    Comment: Glucose reference range applies only to samples taken after fasting for at least 8 hours.  Urinalysis, Routine w reflex microscopic -Urine, Clean Catch     Status: Abnormal   Collection Time: 09/25/23  2:37 PM  Result Value Ref Range   Color, Urine YELLOW YELLOW   APPearance CLOUDY (A) CLEAR   Specific Gravity, Urine 1.020 1.005 - 1.030   pH 6.0 5.0 - 8.0   Glucose, UA NEGATIVE NEGATIVE mg/dL   Hgb urine dipstick SMALL (A) NEGATIVE   Bilirubin Urine NEGATIVE NEGATIVE   Ketones, ur 20 (A) NEGATIVE mg/dL   Protein, ur 643 (A) NEGATIVE mg/dL   Nitrite NEGATIVE NEGATIVE   Leukocytes,Ua NEGATIVE NEGATIVE   RBC / HPF 0-5 0 - 5 RBC/hpf   WBC, UA 0-5 0 - 5 WBC/hpf   Bacteria, UA RARE (A) NONE SEEN   Squamous Epithelial / HPF 0-5 0 - 5 /HPF   Mucus PRESENT    Hyaline Casts, UA PRESENT     Comment: Performed at [facility], [address]  [place], [place] [address]  CK  Status: None   Collection Time: 09/25/23  8:25 PM  Result Value Ref Range   Total CK 134 38 - 234 U/L    Comment: Performed at [facility], [address], [place], [place] [address]  Basic metabolic panel     Status: Abnormal   Collection Time: 09/26/23  4:03 AM  Result Value Ref Range   Sodium 135 135 - 145 mmol/L   Potassium 3.4 (L) 3.5 - 5.1 mmol/L   Chloride 93 (L) 98 - 111 mmol/L   CO2 27 22 - 32 mmol/L   Glucose, Bld 96 70 - 99 mg/dL    Comment: Glucose reference range applies only to samples taken after fasting for at least 8 hours.   BUN 12 8 - 23 mg/dL   Creatinine, Ser 2.95 0.44 - 1.00 mg/dL   Calcium 9.1 8.9 - 62.1 mg/dL   GFR, Estimated >30 >86 mL/min    Comment: (NOTE) Calculated using the CKD-EPI Creatinine Equation (2021)    Anion gap 15 5 - 15    Comment: Performed at [facility], [address], [place], [place] [address]  CBC     Status: None   Collection Time: 09/26/23  4:03 AM  Result Value Ref Range   WBC 9.1 4.0 - 10.5 K/uL   RBC 4.14 3.87 - 5.11 MIL/uL   Hemoglobin 13.7 12.0 - 15.0 g/dL   HCT 96.2 95.2 - 84.1 %   MCV 96.9 80.0 - 100.0 fL   MCH 33.1 26.0 - 34.0 pg   MCHC 34.2 30.0 - 36.0 g/dL   RDW 32.4 40.1 - 02.7 %   Platelets 196 150 - 400 K/uL   nRBC 0.0 0.0 - 0.2 %    Comment: Performed at [facility], [address], [place], [place] [address]   CT Head Wo Contrast  Result Date: 09/25/2023 CLINICAL DATA:  Two witnessed seizures.  Fell off the toilet. EXAM: CT HEAD WITHOUT CONTRAST CT CERVICAL SPINE WITHOUT CONTRAST TECHNIQUE: Multidetector CT imaging of the head and cervical spine was performed following the standard protocol without intravenous contrast. Multiplanar CT image reconstructions of the cervical spine were also generated. RADIATION DOSE REDUCTION: This exam was performed according to the departmental dose-optimization program which includes automated exposure control, adjustment of the  mA and/or kV according to patient size and/or use of iterative reconstruction technique. COMPARISON:  Head CT, 08/18/2023.  Cervical CT, 05/17/2022. FINDINGS: CT HEAD FINDINGS Brain: No evidence of acute infarction, hemorrhage, hydrocephalus, extra-axial collection or mass lesion/mass effect. Vascular: No hyperdense vessel or unexpected calcification. Skull: Normal. Negative for fracture or focal lesion. Sinuses/Orbits: Globes and orbits are unremarkable. Sinuses are clear. Other: None. CT CERVICAL SPINE FINDINGS Alignment: Straightened cervical lordosis.  No spondylolisthesis. Skull base and vertebrae: No acute fracture. No primary bone lesion or focal pathologic process. Soft tissues and spinal canal: No prevertebral fluid or swelling. No visible canal hematoma. Disc levels: Previous anterior cervical spine fusion, C4 through C7. Orthopedic hardware well-seated and stable. Mild loss of disc height at C3-C4 with moderate loss of disc height at C7-T1. No evidence of a disc herniation. Upper chest: No acute or significant abnormality. Other: None. IMPRESSION: HEAD CT 1. No acute intracranial abnormalities. CERVICAL CT 1. No fracture or acute finding. Electronically Signed   By: Amie Portland M.D.   On: 09/25/2023 12:16   CT Cervical Spine Wo Contrast  Result Date: 09/25/2023 CLINICAL DATA:  Two witnessed seizures.  Fell off the toilet.  EXAM: CT HEAD WITHOUT CONTRAST CT CERVICAL SPINE WITHOUT CONTRAST TECHNIQUE: Multidetector CT imaging of the head and cervical spine was performed following the standard protocol without intravenous contrast. Multiplanar CT image reconstructions of the cervical spine were also generated. RADIATION DOSE REDUCTION: This exam was performed according to the departmental dose-optimization program which includes automated exposure control, adjustment of the mA and/or kV according to patient size and/or use of iterative reconstruction technique. COMPARISON:  Head CT, 08/18/2023.  Cervical  CT, 05/17/2022. FINDINGS: CT HEAD FINDINGS Brain: No evidence of acute infarction, hemorrhage, hydrocephalus, extra-axial collection or mass lesion/mass effect. Vascular: No hyperdense vessel or unexpected calcification. Skull: Normal. Negative for fracture or focal lesion. Sinuses/Orbits: Globes and orbits are unremarkable. Sinuses are clear. Other: None. CT CERVICAL SPINE FINDINGS Alignment: Straightened cervical lordosis.  No spondylolisthesis. Skull base and vertebrae: No acute fracture. No primary bone lesion or focal pathologic process. Soft tissues and spinal canal: No prevertebral fluid or swelling. No visible canal hematoma. Disc levels: Previous anterior cervical spine fusion, C4 through C7. Orthopedic hardware well-seated and stable. Mild loss of disc height at C3-C4 with moderate loss of disc height at C7-T1. No evidence of a disc herniation. Upper chest: No acute or significant abnormality. Other: None. IMPRESSION: HEAD CT 1. No acute intracranial abnormalities. CERVICAL CT 1. No fracture or acute finding. Electronically Signed   By: Amie Portland M.D.   On: 09/25/2023 12:16   DG Chest Port 1 View  Result Date: 09/25/2023 CLINICAL DATA:  Seizure. EXAM: PORTABLE CHEST 1 VIEW COMPARISON:  08/18/2023 FINDINGS: Heart size appears normal. Lung volumes are low. Opacity within the periphery of the left base may represent atelectasis, small effusion or airspace disease. Right lung appears clear. Status post ACDF. Chronic appearing impaction injury involving the greater tuberosity of the proximal right humerus. IMPRESSION: 1. Low lung volumes. 2. Opacity within the periphery of the left base may represent atelectasis, small effusion or airspace disease. Electronically Signed   By: Signa Kell M.D.   On: 09/25/2023 11:29    Pending Labs Unresulted Labs (From admission, onward)     Start     Ordered   10/02/23 0500  Creatinine, serum  (enoxaparin (LOVENOX)    CrCl >/= 30 ml/min)  Weekly,   R      Comments: while on enoxaparin therapy    09/25/23 1550   09/25/23 1742  Levetiracetam level  Once,   R        09/25/23 1741            Vitals/Pain Today's Vitals   09/26/23 0615 09/26/23 0900 09/26/23 1000 09/26/23 1046  BP:  117/77 130/84   Pulse:  75 90   Resp:      Temp:    98.2 F (36.8 C)  TempSrc:    Oral  SpO2:  96% 100%   Weight:      Height:      PainSc: 0-No pain       Isolation Precautions No active isolations  Medications Medications  enoxaparin (LOVENOX) injection 40 mg (40 mg Subcutaneous Given 09/25/23 1653)  acetaminophen (TYLENOL) tablet 650 mg (650 mg Oral Given 09/26/23 0405)    Or  acetaminophen (TYLENOL) suppository 650 mg ( Rectal See Alternative 09/26/23 0405)  ondansetron (ZOFRAN) tablet 4 mg (has no administration in time range)    Or  ondansetron (ZOFRAN) injection 4 mg (has no administration in time range)  gabapentin (NEURONTIN) capsule 300 mg (300 mg Oral Given 09/26/23 1049)  lacosamide (  VIMPAT) tablet 50 mg (50 mg Oral Given 09/26/23 1049)  levETIRAcetam (KEPPRA) tablet 500 mg (500 mg Oral Given 09/26/23 1049)  pantoprazole (PROTONIX) EC tablet 40 mg (40 mg Oral Given 09/26/23 1047)  thiamine (VITAMIN B1) tablet 100 mg (100 mg Oral Given 09/26/23 1049)  cyanocobalamin (VITAMIN B12) tablet 1,000 mcg (1,000 mcg Oral Given 09/26/23 1048)  nystatin (MYCOSTATIN) 100000 UNIT/ML suspension 500,000 Units (500,000 Units Mouth/Throat Given 09/26/23 1057)  lacosamide (VIMPAT) 200 mg in sodium chloride 0.9 % 25 mL IVPB (0 mg Intravenous Stopped 09/25/23 1306)  potassium chloride SA (KLOR-CON M) CR tablet 40 mEq (40 mEq Oral Given 09/25/23 1652)  potassium chloride SA (KLOR-CON M) CR tablet 40 mEq (40 mEq Oral Given 09/26/23 1046)    Mobility walks     Focused Assessments Neuro Assessment Handoff:  Swallow screen pass? No  Cardiac Rhythm: Normal sinus rhythm       Neuro Assessment: Within Defined Limits Neuro Checks:      Has TPA been given?  No If patient is a Neuro Trauma and patient is going to OR before floor call report to 4N Charge nurse: [phone] or [phone]   R Recommendations: See Admitting Provider Note  Report given to:   Additional Notes: patient is ambulatory, she need seizure pads on the bed. She have not had a seizure last night night or today. She c/o her throat hurt and she taking the nystatin swish and swallow. Tongue is coated. Tongue hurt, because she bit it.

## 2023-09-26 NOTE — Plan of Care (Signed)
EEG report reviewed.  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  Plan as previously documented.  Neurology will sign off.

## 2023-09-26 NOTE — Plan of Care (Signed)
  Problem: Education: Goal: Knowledge of General Education information will improve Description: Including pain rating scale, medication(s)/side effects and non-pharmacologic comfort measures Outcome: Progressing   Problem: Health Behavior/Discharge Planning: Goal: Ability to manage health-related needs will improve Outcome: Progressing   Problem: Clinical Measurements: Goal: Ability to maintain clinical measurements within normal limits will improve Outcome: Progressing Goal: Will remain free from infection Outcome: Progressing Goal: Diagnostic test results will improve Outcome: Progressing Goal: Respiratory complications will improve Outcome: Progressing Goal: Cardiovascular complication will be avoided Outcome: Progressing   Problem: Activity: Goal: Risk for activity intolerance will decrease Outcome: Progressing   Problem: Nutrition: Goal: Adequate nutrition will be maintained Outcome: Progressing   Problem: Coping: Goal: Level of anxiety will decrease Outcome: Progressing   Problem: Elimination: Goal: Will not experience complications related to bowel motility Outcome: Progressing Goal: Will not experience complications related to urinary retention Outcome: Progressing   Problem: Pain Management: Goal: General experience of comfort will improve Outcome: Progressing   Problem: Safety: Goal: Ability to remain free from injury will improve Outcome: Progressing   Problem: Skin Integrity: Goal: Risk for impaired skin integrity will decrease Outcome: Progressing   Problem: Education: Goal: Expressions of having a comfortable level of knowledge regarding the disease process will increase Outcome: Progressing   Problem: Coping: Goal: Ability to adjust to condition or change in health will improve Outcome: Progressing Goal: Ability to identify appropriate support needs will improve Outcome: Progressing   Problem: Health Behavior/Discharge Planning: Goal:  Compliance with prescribed medication regimen will improve Outcome: Progressing   Problem: Medication: Goal: Risk for medication side effects will decrease Outcome: Progressing   Problem: Clinical Measurements: Goal: Complications related to the disease process, condition or treatment will be avoided or minimized Outcome: Progressing Goal: Diagnostic test results will improve Outcome: Progressing   Problem: Safety: Goal: Verbalization of understanding the information provided will improve Outcome: Progressing   Problem: Self-Concept: Goal: Level of anxiety will decrease Outcome: Progressing Goal: Ability to verbalize feelings about condition will improve Outcome: Progressing

## 2023-09-26 NOTE — Progress Notes (Signed)
PROGRESS NOTE    [name]  ZOX:[id number] DOB: 1955-11-17 DOA: 09/25/2023 PCP: Diamantina Providence, FNP    Brief Narrative:  67 year old with history of alcohol use, seizure disorder, medical noncompliance, ongoing alcohol use, sharing seizure medication with husband, recurrent admissions brought to the emergency room with 2 episodes of witnessed seizure by husband.  Recent admission to the hospital with similar episode.  Seen by neurology in the ER, started back on her seizure medications.  Clinically improving.  Subjective: Patient seen in the emergency room.  She has difficulty swallowing and coated tongue.  Denies any other complaints.  No more episodes of seizure while in the hospital. Does not feel like she can get up and go home today.  She is complaining of unable to swallow meal.  Assessment & Plan:   Breakthrough seizure in a patient with known seizure disorder: Noncompliance to AEDs and alcohol use.  Ran out of medication because she shared that with her husband. Loaded with Vimpat and back on Vimpat 50 mg twice daily, Keppra 500 mg twice daily, gabapentin 300 mg 3 times daily.  Patient will need medication help on discharge.  Instructed not to share medications. Seizure precautions.  Mobilize.  Throat pain: Patient does have some evidence of coated tongue.  No visible thrush.  Will use nystatin swish and swallow.  Alcohol use disorder: Denies going through withdrawals.  Drinks about sixpack of beer a day.  On multivitamins.  Close monitoring.  Will start on low-dose benzodiazepine as needed today.  Hypokalemia: Replaced.   DVT prophylaxis: enoxaparin (LOVENOX) injection 40 mg Start: 09/25/23 1600   Code Status: Full code Family Communication: None at the bedside Disposition Plan: Status is: Observation The patient will require care spanning > 2 midnights and should be moved to inpatient because: Significant swallowing difficulty, monitoring for seizure.      Consultants:  Neurology  Procedures:  EEG  Antimicrobials:  None     Objective: Vitals:   09/26/23 0900 09/26/23 1000 09/26/23 1046 09/26/23 1236  BP: 117/77 130/84  131/88  Pulse: 75 90  85  Resp:    14  Temp:   98.2 F (36.8 C) 98.6 F (37 C)  TempSrc:   Oral   SpO2: 96% 100%  99%  Weight:      Height:       No intake or output data in the 24 hours ending 09/26/23 1351 Filed Weights   09/25/23 2311  Weight: 64.4 kg    Examination:  General exam: Appears chronically sick looking.  Frail.  Not in any distress. Alert awake and oriented.  No focal logical deficits. Oropharyngeal exam with coated tongue, no evidence of thrush or erythema. Respiratory system: Clear to auscultation. Respiratory effort normal. Cardiovascular system: S1 & S2 heard, RRR. No pedal edema. Gastrointestinal system: Soft and nontender.  Bowel sound present. Central nervous system: Alert and oriented. No focal neurological deficits.    Data Reviewed: I have personally reviewed following labs and imaging studies  CBC: Recent Labs  Lab 09/25/23 1058 09/26/23 0403  WBC 10.4 9.1  NEUTROABS 9.4*  --   HGB 14.4 13.7  HCT 42.1 40.1  MCV 95.9 96.9  PLT 245 196   Basic Metabolic Panel: Recent Labs  Lab 09/25/23 1058 09/26/23 0403  NA 130* 135  K 3.3* 3.4*  CL 86* 93*  CO2 21* 27  GLUCOSE 169* 96  BUN 15 12  CREATININE 1.02* 0.90  CALCIUM 9.4 9.1  MG 2.3  --  GFR: Estimated Creatinine Clearance: 52.1 mL/min (by C-G formula based on SCr of 0.9 mg/dL). Liver Function Tests: Recent Labs  Lab 09/25/23 1058  AST 44*  ALT 28  ALKPHOS 65  BILITOT 1.9*  PROT 7.5  ALBUMIN 4.2   No results for input(s): "LIPASE", "AMYLASE" in the last 168 hours. No results for input(s): "AMMONIA" in the last 168 hours. Coagulation Profile: No results for input(s): "INR", "PROTIME" in the last 168 hours. Cardiac Enzymes: Recent Labs  Lab 09/25/23 2025  CKTOTAL 134   BNP (last 3  results) No results for input(s): "PROBNP" in the last 8760 hours. HbA1C: No results for input(s): "HGBA1C" in the last 72 hours. CBG: Recent Labs  Lab 09/25/23 1103  GLUCAP 193*   Lipid Profile: No results for input(s): "CHOL", "HDL", "LDLCALC", "TRIG", "CHOLHDL", "LDLDIRECT" in the last 72 hours. Thyroid Function Tests: No results for input(s): "TSH", "T4TOTAL", "FREET4", "T3FREE", "THYROIDAB" in the last 72 hours. Anemia Panel: No results for input(s): "VITAMINB12", "FOLATE", "FERRITIN", "TIBC", "IRON", "RETICCTPCT" in the last 72 hours. Sepsis Labs: No results for input(s): "PROCALCITON", "LATICACIDVEN" in the last 168 hours.  No results found for this or any previous visit (from the past 240 hour(s)).       Radiology Studies: EEG adult  Result Date: 10-03-23 Jefferson Fuel, MD     2023-10-03  1:36 PM Routine EEG Report [name] is a 67 y.o. female with a history of seizure who is undergoing an EEG to evaluate for seizures. Report: This EEG was acquired with electrodes placed according to the International 10-20 electrode system (including Fp1, Fp2, F3, F4, C3, C4, P3, P4, O1, O2, T3, T4, T5, T6, A1, A2, Fz, Cz, Pz). The following electrodes were missing or displaced: none. The occipital dominant rhythm was 5-6 Hz. This activity is reactive to stimulation. Drowsiness was manifested by background fragmentation; deeper stages of sleep were not identified. There was no focal slowing. There were no interictal epileptiform discharges. There were no electrographic seizures identified. There was no abnormal response to photic stimulation or hyperventilation. Impression and clinical correlation: This EEG was obtained while awake and drowsy and is abnormal due to moderate diffuse slowing indicative of global cerebral dysfunction. Recording interpretation had significant myogenic artifact, but within that limitation no definitive epileptiform discharges were seen. Bing Neighbors,  MD [facility] [phone] If 7pm- 7am, please page neurology on call as listed in AMION.   CT Head Wo Contrast  Result Date: 09/25/2023 CLINICAL DATA:  Two witnessed seizures.  Fell off the toilet. EXAM: CT HEAD WITHOUT CONTRAST CT CERVICAL SPINE WITHOUT CONTRAST TECHNIQUE: Multidetector CT imaging of the head and cervical spine was performed following the standard protocol without intravenous contrast. Multiplanar CT image reconstructions of the cervical spine were also generated. RADIATION DOSE REDUCTION: This exam was performed according to the departmental dose-optimization program which includes automated exposure control, adjustment of the mA and/or kV according to patient size and/or use of iterative reconstruction technique. COMPARISON:  Head CT, 08/18/2023.  Cervical CT, 05/17/2022. FINDINGS: CT HEAD FINDINGS Brain: No evidence of acute infarction, hemorrhage, hydrocephalus, extra-axial collection or mass lesion/mass effect. Vascular: No hyperdense vessel or unexpected calcification. Skull: Normal. Negative for fracture or focal lesion. Sinuses/Orbits: Globes and orbits are unremarkable. Sinuses are clear. Other: None. CT CERVICAL SPINE FINDINGS Alignment: Straightened cervical lordosis.  No spondylolisthesis. Skull base and vertebrae: No acute fracture. No primary bone lesion or focal pathologic process. Soft tissues and spinal canal: No prevertebral fluid or swelling. No  visible canal hematoma. Disc levels: Previous anterior cervical spine fusion, C4 through C7. Orthopedic hardware well-seated and stable. Mild loss of disc height at C3-C4 with moderate loss of disc height at C7-T1. No evidence of a disc herniation. Upper chest: No acute or significant abnormality. Other: None. IMPRESSION: HEAD CT 1. No acute intracranial abnormalities. CERVICAL CT 1. No fracture or acute finding. Electronically Signed   By: Amie Portland M.D.   On: 09/25/2023 12:16   CT Cervical Spine Wo  Contrast  Result Date: 09/25/2023 CLINICAL DATA:  Two witnessed seizures.  Fell off the toilet. EXAM: CT HEAD WITHOUT CONTRAST CT CERVICAL SPINE WITHOUT CONTRAST TECHNIQUE: Multidetector CT imaging of the head and cervical spine was performed following the standard protocol without intravenous contrast. Multiplanar CT image reconstructions of the cervical spine were also generated. RADIATION DOSE REDUCTION: This exam was performed according to the departmental dose-optimization program which includes automated exposure control, adjustment of the mA and/or kV according to patient size and/or use of iterative reconstruction technique. COMPARISON:  Head CT, 08/18/2023.  Cervical CT, 05/17/2022. FINDINGS: CT HEAD FINDINGS Brain: No evidence of acute infarction, hemorrhage, hydrocephalus, extra-axial collection or mass lesion/mass effect. Vascular: No hyperdense vessel or unexpected calcification. Skull: Normal. Negative for fracture or focal lesion. Sinuses/Orbits: Globes and orbits are unremarkable. Sinuses are clear. Other: None. CT CERVICAL SPINE FINDINGS Alignment: Straightened cervical lordosis.  No spondylolisthesis. Skull base and vertebrae: No acute fracture. No primary bone lesion or focal pathologic process. Soft tissues and spinal canal: No prevertebral fluid or swelling. No visible canal hematoma. Disc levels: Previous anterior cervical spine fusion, C4 through C7. Orthopedic hardware well-seated and stable. Mild loss of disc height at C3-C4 with moderate loss of disc height at C7-T1. No evidence of a disc herniation. Upper chest: No acute or significant abnormality. Other: None. IMPRESSION: HEAD CT 1. No acute intracranial abnormalities. CERVICAL CT 1. No fracture or acute finding. Electronically Signed   By: Amie Portland M.D.   On: 09/25/2023 12:16   DG Chest Port 1 View  Result Date: 09/25/2023 CLINICAL DATA:  Seizure. EXAM: PORTABLE CHEST 1 VIEW COMPARISON:  08/18/2023 FINDINGS: Heart size appears  normal. Lung volumes are low. Opacity within the periphery of the left base may represent atelectasis, small effusion or airspace disease. Right lung appears clear. Status post ACDF. Chronic appearing impaction injury involving the greater tuberosity of the proximal right humerus. IMPRESSION: 1. Low lung volumes. 2. Opacity within the periphery of the left base may represent atelectasis, small effusion or airspace disease. Electronically Signed   By: Signa Kell M.D.   On: 09/25/2023 11:29        Scheduled Meds:  vitamin B-12  1,000 mcg Oral Daily   enoxaparin (LOVENOX) injection  40 mg Subcutaneous Q24H   gabapentin  300 mg Oral TID   lacosamide  50 mg Oral BID   levETIRAcetam  500 mg Oral BID   nystatin  5 mL Mouth/Throat QID   pantoprazole  40 mg Oral Daily   thiamine  100 mg Oral Daily   Continuous Infusions:   LOS: 0 days    Time spent: 35 minutes    Dorcas Carrow, MD [facility]

## 2023-09-26 NOTE — Procedures (Addendum)
Routine EEG Report  [name] is a 67 y.o. female with a history of seizure who is undergoing an EEG to evaluate for seizures.  Report: This EEG was acquired with electrodes placed according to the International 10-20 electrode system (including Fp1, Fp2, F3, F4, C3, C4, P3, P4, O1, O2, T3, T4, T5, T6, A1, A2, Fz, Cz, Pz). The following electrodes were missing or displaced: none.  The occipital dominant rhythm was 5-6 Hz. This activity is reactive to stimulation. Drowsiness was manifested by background fragmentation; deeper stages of sleep were not identified. There was no focal slowing. There were no interictal epileptiform discharges. There were no electrographic seizures identified. There was no abnormal response to photic stimulation or hyperventilation.   Impression and clinical correlation: This EEG was obtained while awake and drowsy and is abnormal due to moderate diffuse slowing indicative of global cerebral dysfunction. Recording interpretation had significant myogenic artifact, but within that limitation no definitive epileptiform discharges were seen.  Bing Neighbors, MD [facility] [phone]  If 7pm- 7am, please page neurology on call as listed in AMION.

## 2023-09-27 DIAGNOSIS — R569 Unspecified convulsions: Secondary | ICD-10-CM | POA: Diagnosis not present

## 2023-09-27 DIAGNOSIS — G40909 Epilepsy, unspecified, not intractable, without status epilepticus: Secondary | ICD-10-CM | POA: Diagnosis not present

## 2023-09-27 LAB — BASIC METABOLIC PANEL
Anion gap: 15 (ref 5–15)
BUN: 12 mg/dL (ref 8–23)
CO2: 24 mmol/L (ref 22–32)
Calcium: 9 mg/dL (ref 8.9–10.3)
Chloride: 96 mmol/L — ABNORMAL LOW (ref 98–111)
Creatinine, Ser: 0.95 mg/dL (ref 0.44–1.00)
GFR, Estimated: >60 mL/min (ref >60)
Glucose, Bld: 98 mg/dL (ref 70–99)
Potassium: 3.1 mmol/L — ABNORMAL LOW (ref 3.5–5.1)
Sodium: 135 mmol/L (ref 135–145)

## 2023-09-27 LAB — MAGNESIUM: Magnesium: 2.1 mg/dL (ref 1.7–2.4)

## 2023-09-27 LAB — PHOSPHORUS: Phosphorus: 2.1 mg/dL — ABNORMAL LOW (ref 2.5–4.6)

## 2023-09-27 MED ORDER — LORAZEPAM 2 MG/ML IJ SOLN
1.0000 mg | INTRAMUSCULAR | Status: DC | PRN
Start: 2023-09-27 — End: 2023-09-30

## 2023-09-27 MED ORDER — POTASSIUM PHOSPHATES 15 MMOLE/5ML IV SOLN
30.0000 mmol | Freq: Once | INTRAVENOUS | Status: AC
  Administered 2023-09-27: 30 mmol via INTRAVENOUS
  Filled 2023-09-27: qty 10

## 2023-09-27 MED ORDER — LORAZEPAM 1 MG PO TABS: 1.0000 mg | ORAL_TABLET | ORAL | Status: DC | PRN

## 2023-09-27 MED ORDER — MENTHOL 3 MG MT LOZG: 1.0000 | LOZENGE | OROMUCOSAL | Status: DC | PRN

## 2023-09-27 MED ORDER — POTASSIUM CHLORIDE CRYS ER 20 MEQ PO TBCR
40.0000 meq | EXTENDED_RELEASE_TABLET | Freq: Once | ORAL | Status: AC
  Administered 2023-09-27: 40 meq via ORAL
  Filled 2023-09-27: qty 2

## 2023-09-27 MED ORDER — TRAMADOL HCL 50 MG PO TABS
50.0000 mg | ORAL_TABLET | Freq: Three times a day (TID) | ORAL | Status: DC | PRN
  Administered 2023-09-27 – 2023-09-28 (×3): 50 mg via ORAL
  Filled 2023-09-27 (×3): qty 1

## 2023-09-27 MED ORDER — ADULT MULTIVITAMIN W/MINERALS CH
1.0000 | ORAL_TABLET | Freq: Every day | ORAL | Status: DC
  Administered 2023-09-27 – 2023-09-28 (×2): 1 via ORAL
  Filled 2023-09-27 (×2): qty 1

## 2023-09-27 MED ORDER — POTASSIUM CHLORIDE CRYS ER 20 MEQ PO TBCR
20.0000 meq | EXTENDED_RELEASE_TABLET | Freq: Once | ORAL | Status: AC
  Administered 2023-09-27: 20 meq via ORAL
  Filled 2023-09-27: qty 1

## 2023-09-27 MED ORDER — FOLIC ACID 1 MG PO TABS
1.0000 mg | ORAL_TABLET | Freq: Every day | ORAL | Status: DC
  Administered 2023-09-27 – 2023-09-28 (×2): 1 mg via ORAL
  Filled 2023-09-27 (×2): qty 1

## 2023-09-27 NOTE — Progress Notes (Signed)
Mobility Specialist Progress Note;   09/27/23 1350  Mobility  Activity Ambulated with assistance in hallway  Level of Assistance Contact guard assist, steadying assist  Assistive Device Other (Comment) (IV pole)  Distance Ambulated (ft) 175 ft  Activity Response Tolerated well  Mobility Referral Yes  Mobility visit 1 Mobility  Mobility Specialist Start Time (ACUTE ONLY) 1350  Mobility Specialist Stop Time (ACUTE ONLY) 1405  Mobility Specialist Time Calculation (min) (ACUTE ONLY) 15 min   Pt agreeable to mobility. Required MinG assistance during ambulation for safety. VSS throughout and no c/o during session. Pt did seem fatigued at EOS, returned to room. Pt left in bed with all needs met, alarm on.   [name] Mobility Specialist Please contact via SecureChat or Rehab Office [phone]

## 2023-09-27 NOTE — Progress Notes (Signed)
PROGRESS NOTE                                                                                                                                                                                                             Patient Demographics:    [name], is a 67 y.o. female, DOB - 1956-03-10, ZHY:[id number]  Outpatient Primary MD for the patient is Diamantina Providence, FNP    LOS - 0  Admit date - 09/25/2023    Chief Complaint  Patient presents with   Seizures       Brief Narrative (HPI from H&P)   67 year old with history of alcohol use, seizure disorder, medical noncompliance, ongoing alcohol use, sharing seizure medication with husband, recurrent admissions brought to the emergency room with 2 episodes of witnessed seizure by husband. Recent admission to the hospital with similar episode.   Subjective:    [name] today has, No headache, No chest pain, No abdominal pain - No Nausea, No new weakness tingling or numbness, no SOB, mild tongue bite site pain   Assessment  & Plan :    Breakthrough seizure in a patient with known seizure disorder: Noncompliance to AEDs and alcohol use.  Ran out of medication because she shared that with her fianc/husband. Loaded with Vimpat and back on Vimpat 50 mg twice daily, Keppra 500 mg twice daily, gabapentin 300 mg 3 times daily.  She has been strictly counseled on compliance with seizure medications and abstaining from alcohol, no signs of DTs or withdrawal, case discussed with Dr. Amada Jupiter neurologist in detail on 09/27/2023 including her EEG report.  Resume home medications, PT OT, advance activity, if stable likely discharge on 09/27/2023    Throat pain: Patient does have some evidence of coated tongue.  No visible thrush.  Will use nystatin swish and swallow along with Cepacol lozenges.   Alcohol use disorder: Denies going through withdrawals.  Drinks about sixpack  of beer a day.  Counseled, CIWA protocol.   Hypophosphatemia and hypokalemia: Replaced.         Condition - Extremely Guarded  Family Communication  : None present  Code Status : Full code  Consults  : Neurology  PUD Prophylaxis : PPI   Procedures  :     CT head, EEG.  Nonacute.      Disposition Plan  :  Status is: Observation  DVT Prophylaxis  :    enoxaparin (LOVENOX) injection 40 mg Start: 09/25/23 1600   Lab Results  Component Value Date   PLT 196 09/26/2023    Diet :  Diet Order             Diet regular Room service appropriate? Yes; Fluid consistency: Thin  Diet effective now                    Inpatient Medications  Scheduled Meds:  vitamin B-12  1,000 mcg Oral Daily   enoxaparin (LOVENOX) injection  40 mg Subcutaneous Q24H   gabapentin  300 mg Oral TID   lacosamide  50 mg Oral BID   levETIRAcetam  500 mg Oral BID   nystatin  5 mL Mouth/Throat QID   pantoprazole  40 mg Oral Daily   potassium chloride  20 mEq Oral Once   potassium chloride  40 mEq Oral Once   thiamine  100 mg Oral Daily   Continuous Infusions:  potassium PHOSPHATE IVPB (in mmol)     PRN Meds:.acetaminophen **OR** acetaminophen, ondansetron **OR** ondansetron (ZOFRAN) IV, traMADol  Antibiotics  :    Anti-infectives (From admission, onward)    None         Objective:   Vitals:   09/26/23 1236 09/26/23 1600 09/26/23 2040 09/27/23 0400  BP: 131/88 120/83 113/82 114/78  Pulse: 85 84 88 75  Resp: 14 13 15 15   Temp: 98.6 F (37 C) 98.2 F (36.8 C) 99 F (37.2 C) 98.1 F (36.7 C)  TempSrc:  Oral Oral Oral  SpO2: 99% 100% (!) 88% 96%  Weight:      Height:        Wt Readings from Last 3 Encounters:  09/25/23 64.4 kg  05/09/23 63.6 kg  12/21/22 59.7 kg    No intake or output data in the 24 hours ending 09/27/23 0819   Physical Exam  Awake Alert, No new F.N deficits, Normal affect [place].AT,PERRAL, right lateral tongue bite site stable Supple Neck,  No JVD,   Symmetrical Chest wall movement, Good air movement bilaterally, CTAB RRR,No Gallops,Rubs or new Murmurs,  +ve B.Sounds, Abd Soft, No tenderness,   No Cyanosis, Clubbing or edema      Data Review:    Recent Labs  Lab 09/25/23 1058 09/26/23 0403  WBC 10.4 9.1  HGB 14.4 13.7  HCT 42.1 40.1  PLT 245 196  MCV 95.9 96.9  MCH 32.8 33.1  MCHC 34.2 34.2  RDW 12.7 12.6  LYMPHSABS 0.5*  --   MONOABS 0.4  --   EOSABS 0.0  --   BASOSABS 0.0  --     Recent Labs  Lab 09/25/23 1058 09/26/23 0403 09/27/23 0548  NA 130* 135 135  K 3.3* 3.4* 3.1*  CL 86* 93* 96*  CO2 21* 27 24  ANIONGAP 23* 15 15  GLUCOSE 169* 96 98  BUN 15 12 12   CREATININE 1.02* 0.90 0.95  AST 44*  --   --   ALT 28  --   --   ALKPHOS 65  --   --   BILITOT 1.9*  --   --   ALBUMIN 4.2  --   --   MG 2.3  --  2.1  CALCIUM 9.4 9.1 9.0      Recent Labs  Lab 09/25/23 1058 09/26/23 0403 09/27/23 0548  MG 2.3  --  2.1  CALCIUM 9.4 9.1 9.0    ---------------------------------------------------------------------------------------------------------------  Lab Results  Component Value Date   CHOL 148 06/09/2020   HDL 74 06/09/2020   LDLCALC 58 06/09/2020   TRIG 79 06/09/2020   CHOLHDL 2.0 06/09/2020    Lab Results  Component Value Date   HGBA1C 5.0 05/17/2022    Radiology Reports EEG adult  Result Date: 09/26/2023 Jefferson Fuel, MD     09/26/2023  1:36 PM Routine EEG Report [name] is a 67 y.o. female with a history of seizure who is undergoing an EEG to evaluate for seizures. Report: This EEG was acquired with electrodes placed according to the International 10-20 electrode system (including Fp1, Fp2, F3, F4, C3, C4, P3, P4, O1, O2, T3, T4, T5, T6, A1, A2, Fz, Cz, Pz). The following electrodes were missing or displaced: none. The occipital dominant rhythm was 5-6 Hz. This activity is reactive to stimulation. Drowsiness was manifested by background fragmentation; deeper stages  of sleep were not identified. There was no focal slowing. There were no interictal epileptiform discharges. There were no electrographic seizures identified. There was no abnormal response to photic stimulation or hyperventilation. Impression and clinical correlation: This EEG was obtained while awake and drowsy and is abnormal due to moderate diffuse slowing indicative of global cerebral dysfunction. Recording interpretation had significant myogenic artifact, but within that limitation no definitive epileptiform discharges were seen. Bing Neighbors, MD [facility] [phone] If 7pm- 7am, please page neurology on call as listed in AMION.   CT Head Wo Contrast  Result Date: 09/25/2023 CLINICAL DATA:  Two witnessed seizures.  Fell off the toilet. EXAM: CT HEAD WITHOUT CONTRAST CT CERVICAL SPINE WITHOUT CONTRAST TECHNIQUE: Multidetector CT imaging of the head and cervical spine was performed following the standard protocol without intravenous contrast. Multiplanar CT image reconstructions of the cervical spine were also generated. RADIATION DOSE REDUCTION: This exam was performed according to the departmental dose-optimization program which includes automated exposure control, adjustment of the mA and/or kV according to patient size and/or use of iterative reconstruction technique. COMPARISON:  Head CT, 08/18/2023.  Cervical CT, 05/17/2022. FINDINGS: CT HEAD FINDINGS Brain: No evidence of acute infarction, hemorrhage, hydrocephalus, extra-axial collection or mass lesion/mass effect. Vascular: No hyperdense vessel or unexpected calcification. Skull: Normal. Negative for fracture or focal lesion. Sinuses/Orbits: Globes and orbits are unremarkable. Sinuses are clear. Other: None. CT CERVICAL SPINE FINDINGS Alignment: Straightened cervical lordosis.  No spondylolisthesis. Skull base and vertebrae: No acute fracture. No primary bone lesion or focal pathologic process. Soft tissues and spinal canal: No  prevertebral fluid or swelling. No visible canal hematoma. Disc levels: Previous anterior cervical spine fusion, C4 through C7. Orthopedic hardware well-seated and stable. Mild loss of disc height at C3-C4 with moderate loss of disc height at C7-T1. No evidence of a disc herniation. Upper chest: No acute or significant abnormality. Other: None. IMPRESSION: HEAD CT 1. No acute intracranial abnormalities. CERVICAL CT 1. No fracture or acute finding. Electronically Signed   By: Amie Portland M.D.   On: 09/25/2023 12:16   CT Cervical Spine Wo Contrast  Result Date: 09/25/2023 CLINICAL DATA:  Two witnessed seizures.  Fell off the toilet. EXAM: CT HEAD WITHOUT CONTRAST CT CERVICAL SPINE WITHOUT CONTRAST TECHNIQUE: Multidetector CT imaging of the head and cervical spine was performed following the standard protocol without intravenous contrast. Multiplanar CT image reconstructions of the cervical spine were also generated. RADIATION DOSE REDUCTION: This exam was performed according to the departmental dose-optimization program which includes automated exposure control, adjustment of the mA and/or kV according  to patient size and/or use of iterative reconstruction technique. COMPARISON:  Head CT, 08/18/2023.  Cervical CT, 05/17/2022. FINDINGS: CT HEAD FINDINGS Brain: No evidence of acute infarction, hemorrhage, hydrocephalus, extra-axial collection or mass lesion/mass effect. Vascular: No hyperdense vessel or unexpected calcification. Skull: Normal. Negative for fracture or focal lesion. Sinuses/Orbits: Globes and orbits are unremarkable. Sinuses are clear. Other: None. CT CERVICAL SPINE FINDINGS Alignment: Straightened cervical lordosis.  No spondylolisthesis. Skull base and vertebrae: No acute fracture. No primary bone lesion or focal pathologic process. Soft tissues and spinal canal: No prevertebral fluid or swelling. No visible canal hematoma. Disc levels: Previous anterior cervical spine fusion, C4 through C7.  Orthopedic hardware well-seated and stable. Mild loss of disc height at C3-C4 with moderate loss of disc height at C7-T1. No evidence of a disc herniation. Upper chest: No acute or significant abnormality. Other: None. IMPRESSION: HEAD CT 1. No acute intracranial abnormalities. CERVICAL CT 1. No fracture or acute finding. Electronically Signed   By: Amie Portland M.D.   On: 09/25/2023 12:16   DG Chest Port 1 View  Result Date: 09/25/2023 CLINICAL DATA:  Seizure. EXAM: PORTABLE CHEST 1 VIEW COMPARISON:  08/18/2023 FINDINGS: Heart size appears normal. Lung volumes are low. Opacity within the periphery of the left base may represent atelectasis, small effusion or airspace disease. Right lung appears clear. Status post ACDF. Chronic appearing impaction injury involving the greater tuberosity of the proximal right humerus. IMPRESSION: 1. Low lung volumes. 2. Opacity within the periphery of the left base may represent atelectasis, small effusion or airspace disease. Electronically Signed   By: Signa Kell M.D.   On: 09/25/2023 11:29      Signature  -   Susa Raring M.D on 09/27/2023 at 8:19 AM   -  To page go to [email or web address]

## 2023-09-27 NOTE — Evaluation (Signed)
Physical Therapy Evaluation Patient Details Name: [name] MRN: [id number] DOB: [birth year] Today's Date: 09/27/2023  History of Present Illness  Pt is a 67 y.o. female admitted 12/7 after 2 witnessed seizures. PMH: seizure disorder and medical noncompliance, ETOH withdrawal, pancreatitis, anxiety, depression, SAH   Clinical Impression  Pt admitted with above diagnosis. PTA pt lived at [facility] with her husband, mod I mobility with/without SPC. Pt currently with functional limitations due to the deficits listed below (see PT Problem List). On eval, pt required supervision bed mobility, CGA transfers, and HH/CGA amb 150'. No LOB noted. Pt with c/o pain 'all over.' Pt will benefit from acute skilled PT to increase their independence and safety with mobility to allow discharge. PT to follow acutely. No follow up services indicated.          If plan is discharge home, recommend the following: Assistance with cooking/housework;Assist for transportation   Can travel by private vehicle        Equipment Recommendations None recommended by PT  Recommendations for Other Services       Functional Status Assessment Patient has had a recent decline in their functional status and demonstrates the ability to make significant improvements in function in a reasonable and predictable amount of time.     Precautions / Restrictions Precautions Precautions: Fall;Other (comment) Precaution Comments: seizure      Mobility  Bed Mobility Overal bed mobility: Needs Assistance Bed Mobility: Supine to Sit, Sit to Supine     Supine to sit: Supervision Sit to supine: Supervision   General bed mobility comments: for safety    Transfers Overall transfer level: Needs assistance Equipment used: Ambulation equipment used Transfers: Sit to/from Stand Sit to Stand: Contact guard assist           General transfer comment: for safety    Ambulation/Gait Ambulation/Gait  assistance: Contact guard assist Gait Distance (Feet): 150 Feet Assistive device: 1 person hand held assist Gait Pattern/deviations: Step-through pattern, Decreased stride length Gait velocity: decreased Gait velocity interpretation: 1.31 - 2.62 ft/sec, indicative of limited community ambulator   General Gait Details: HHA on R to simulate cane. No LOB noted.  Stairs            Wheelchair Mobility     Tilt Bed    Modified Rankin (Stroke Patients Only)       Balance Overall balance assessment: Mild deficits observed, not formally tested                                           Pertinent Vitals/Pain Pain Assessment Pain Assessment: Faces Faces Pain Scale: Hurts little more Pain Location: 'all over' Pain Descriptors / Indicators: Grimacing, Discomfort Pain Intervention(s): Monitored during session    Home Living Family/patient expects to be discharged to:: Other (Comment) [facility])                        Prior Function Prior Level of Function : Independent/Modified Independent             Mobility Comments: Uses SPC outside, holds onto furniture and walls inside ADLs Comments: Per pt, independent in ADL, husband performs IADLs within the home. Husband always with pt in community setting (taking the bus, uber, etc)     Extremity/Trunk Assessment   Upper Extremity Assessment Upper Extremity Assessment: Overall [facility] for tasks  assessed    Lower Extremity Assessment Lower Extremity Assessment: Overall WFL for tasks assessed    Cervical / Trunk Assessment Cervical / Trunk Assessment: Normal  Communication   Communication Communication: No apparent difficulties  Cognition Arousal: Alert Behavior During Therapy: WFL for tasks assessed/performed Overall Cognitive Status: Within Functional Limits for tasks assessed                                          General Comments      Exercises      Assessment/Plan    PT Assessment Patient needs continued PT services  PT Problem List Decreased balance;Pain;Decreased mobility;Decreased activity tolerance       PT Treatment Interventions Functional mobility training;Balance training;Patient/family education;Gait training;Therapeutic activities;Therapeutic exercise    PT Goals (Current goals can be found in the Care Plan section)  Acute Rehab PT Goals Patient Stated Goal: home PT Goal Formulation: With patient Time For Goal Achievement: 10/11/23 Potential to Achieve Goals: Good    Frequency Min 1X/week     Co-evaluation               AM-PAC PT "6 Clicks" Mobility  Outcome Measure Help needed turning from your back to your side while in a flat bed without using bedrails?: None Help needed moving from lying on your back to sitting on the side of a flat bed without using bedrails?: None Help needed moving to and from a bed to a chair (including a wheelchair)?: A Little Help needed standing up from a chair using your arms (e.g., wheelchair or bedside chair)?: A Little Help needed to walk in hospital room?: A Little Help needed climbing 3-5 steps with a railing? : A Little 6 Click Score: 20    End of Session Equipment Utilized During Treatment: Gait belt Activity Tolerance: Patient tolerated treatment well Patient left: in bed;with call bell/phone within reach Nurse Communication: Mobility status PT Visit Diagnosis: Difficulty in walking, not elsewhere classified (R26.2)    Time: 4098-1191 PT Time Calculation (min) (ACUTE ONLY): 15 min   Charges:   PT Evaluation $PT Eval Low Complexity: 1 Low   PT General Charges $$ ACUTE PT VISIT: 1 Visit         Ferd Glassing., PT  Office # [phone]   Ilda Foil 09/27/2023, 8:48 AM

## 2023-09-27 NOTE — Plan of Care (Signed)
  Problem: Education: Goal: Knowledge of General Education information will improve Description: Including pain rating scale, medication(s)/side effects and non-pharmacologic comfort measures Outcome: Progressing   Problem: Health Behavior/Discharge Planning: Goal: Ability to manage health-related needs will improve Outcome: Progressing   Problem: Clinical Measurements: Goal: Ability to maintain clinical measurements within normal limits will improve Outcome: Progressing Goal: Will remain free from infection Outcome: Progressing Goal: Diagnostic test results will improve Outcome: Progressing Goal: Respiratory complications will improve Outcome: Progressing Goal: Cardiovascular complication will be avoided Outcome: Progressing   Problem: Activity: Goal: Risk for activity intolerance will decrease Outcome: Progressing   Problem: Nutrition: Goal: Adequate nutrition will be maintained Outcome: Progressing   Problem: Coping: Goal: Level of anxiety will decrease Outcome: Progressing   Problem: Elimination: Goal: Will not experience complications related to bowel motility Outcome: Progressing Goal: Will not experience complications related to urinary retention Outcome: Progressing   Problem: Pain Management: Goal: General experience of comfort will improve Outcome: Progressing   Problem: Safety: Goal: Ability to remain free from injury will improve Outcome: Progressing   Problem: Skin Integrity: Goal: Risk for impaired skin integrity will decrease Outcome: Progressing   Problem: Education: Goal: Expressions of having a comfortable level of knowledge regarding the disease process will increase Outcome: Progressing   Problem: Coping: Goal: Ability to adjust to condition or change in health will improve Outcome: Progressing Goal: Ability to identify appropriate support needs will improve Outcome: Progressing   Problem: Health Behavior/Discharge Planning: Goal:  Compliance with prescribed medication regimen will improve Outcome: Progressing   Problem: Medication: Goal: Risk for medication side effects will decrease Outcome: Progressing   Problem: Clinical Measurements: Goal: Complications related to the disease process, condition or treatment will be avoided or minimized Outcome: Progressing Goal: Diagnostic test results will improve Outcome: Progressing   Problem: Safety: Goal: Verbalization of understanding the information provided will improve Outcome: Progressing   Problem: Self-Concept: Goal: Level of anxiety will decrease Outcome: Progressing Goal: Ability to verbalize feelings about condition will improve Outcome: Progressing

## 2023-09-28 ENCOUNTER — Other Ambulatory Visit (HOSPITAL_COMMUNITY): Payer: Self-pay

## 2023-09-28 DIAGNOSIS — F17210 Nicotine dependence, cigarettes, uncomplicated: Secondary | ICD-10-CM | POA: Diagnosis not present

## 2023-09-28 DIAGNOSIS — G40909 Epilepsy, unspecified, not intractable, without status epilepticus: Secondary | ICD-10-CM | POA: Diagnosis not present

## 2023-09-28 DIAGNOSIS — R569 Unspecified convulsions: Secondary | ICD-10-CM | POA: Diagnosis not present

## 2023-09-28 DIAGNOSIS — G9341 Metabolic encephalopathy: Secondary | ICD-10-CM | POA: Diagnosis not present

## 2023-09-28 DIAGNOSIS — E871 Hypo-osmolality and hyponatremia: Secondary | ICD-10-CM | POA: Diagnosis not present

## 2023-09-28 LAB — BASIC METABOLIC PANEL
Anion gap: 10 (ref 5–15)
BUN: 8 mg/dL (ref 8–23)
CO2: 24 mmol/L (ref 22–32)
Calcium: 8.9 mg/dL (ref 8.9–10.3)
Chloride: 99 mmol/L (ref 98–111)
Creatinine, Ser: 0.68 mg/dL (ref 0.44–1.00)
GFR, Estimated: >60 mL/min (ref >60)
Glucose, Bld: 99 mg/dL (ref 70–99)
Potassium: 3.7 mmol/L (ref 3.5–5.1)
Sodium: 133 mmol/L — ABNORMAL LOW (ref 135–145)

## 2023-09-28 LAB — PHOSPHORUS: Phosphorus: 4.3 mg/dL (ref 2.5–4.6)

## 2023-09-28 LAB — LEVETIRACETAM LEVEL: Levetiracetam Lvl: <2 ug/mL — ABNORMAL LOW (ref 10.0–40.0)

## 2023-09-28 MED ORDER — FOLIC ACID 1 MG PO TABS
1.0000 mg | ORAL_TABLET | Freq: Every day | ORAL | 2 refills | Status: AC
  Filled 2023-09-28: qty 30, 30d supply, fill #0

## 2023-09-28 MED ORDER — MENTHOL 3 MG MT LOZG
1.0000 | LOZENGE | OROMUCOSAL | 12 refills | Status: DC | PRN
  Filled 2023-09-28: qty 100, fill #0

## 2023-09-28 MED ORDER — LACOSAMIDE 50 MG PO TABS
50.0000 mg | ORAL_TABLET | Freq: Two times a day (BID) | ORAL | 2 refills | Status: DC
  Filled 2023-09-28: qty 60, 30d supply, fill #0

## 2023-09-28 MED ORDER — THIAMINE HCL 100 MG PO TABS
100.0000 mg | ORAL_TABLET | Freq: Every day | ORAL | 2 refills | Status: AC
  Filled 2023-09-28: qty 30, 30d supply, fill #0

## 2023-09-28 MED ORDER — LEVETIRACETAM 500 MG PO TABS
500.0000 mg | ORAL_TABLET | Freq: Two times a day (BID) | ORAL | 2 refills | Status: DC
  Filled 2023-09-28: qty 60, 30d supply, fill #0

## 2023-09-28 MED ORDER — GABAPENTIN 300 MG PO CAPS
300.0000 mg | ORAL_CAPSULE | Freq: Three times a day (TID) | ORAL | 2 refills | Status: DC
  Filled 2023-09-28: qty 90, 30d supply, fill #0

## 2023-09-28 MED ORDER — CYANOCOBALAMIN 1000 MCG PO TABS
1000.0000 ug | ORAL_TABLET | Freq: Every day | ORAL | 2 refills | Status: AC
  Filled 2023-09-28: qty 30, 30d supply, fill #0

## 2023-09-28 NOTE — Discharge Instructions (Addendum)
Do not drive, operate heavy machinery, perform activities at heights, swimming or participation in water activities or provide baby sitting services until you have seen by Primary MD or a Neurologist and advised to do so again.  Follow with Primary MD Diamantina Providence, FNP in 7 days   Get CBC, CMP,Magnesium  -  checked next visit with your primary MD   Activity: As tolerated with Full fall precautions use walker/cane & assistance as needed  Disposition Home   Diet: Heart Healthy    Special Instructions: If you have smoked or chewed Tobacco  in the last 2 yrs please stop smoking, stop any regular Alcohol  and or any Recreational drug use.  On your next visit with your primary care physician please Get Medicines reviewed and adjusted.  Please request your Prim.MD to go over all Hospital Tests and Procedure/Radiological results at the follow up, please get all Hospital records sent to your Prim MD by signing hospital release before you go home.  If you experience worsening of your admission symptoms, develop shortness of breath, life threatening emergency, suicidal or homicidal thoughts you must seek medical attention immediately by calling 911 or calling your MD immediately  if symptoms less severe.  You Must read complete instructions/literature along with all the possible adverse reactions/side effects for all the Medicines you take and that have been prescribed to you. Take any new Medicines after you have completely understood and accpet all the possible adverse reactions/side effects.   Do not drive when taking Pain medications.  Do not take more than prescribed Pain, Sleep and Anxiety Medications

## 2023-09-28 NOTE — Discharge Summary (Signed)
[name]:[id number] DOB: 12-26-1955 DOA: 09/25/2023  PCP: Diamantina Providence, FNP  Admit date: 09/25/2023  Discharge date: 09/28/2023  Admitted From: Home   Disposition:  Home   Recommendations for Outpatient Follow-up:   Follow up with PCP in 1-2 weeks  PCP Please obtain BMP/CBC, 2 view CXR in 1week,  (see Discharge instructions)   PCP Please follow up on the following pending results:    Home Health: None   Equipment/Devices: None  Consultations: Neuro Discharge Condition: Stable    CODE STATUS: Full    Diet Recommendation: Heart Healthy     Chief Complaint  Patient presents with   Seizures     Brief history of present illness from the day of admission and additional interim summary    67 year old with history of alcohol use, seizure disorder, medical noncompliance, ongoing alcohol use, sharing seizure medication with husband, recurrent admissions brought to the emergency room with 2 episodes of witnessed seizure by husband. Recent admission to the hospital with similar episode.                                                                  Hospital Course   Breakthrough seizure in a patient with known seizure disorder: Noncompliance to AEDs and alcohol use.  Ran out of medication because she shared that with her fianc/husband. Loaded with Vimpat and back on Vimpat 50 mg twice daily, Keppra 500 mg twice daily, gabapentin 300 mg 3 times daily.  She has been strictly counseled on compliance with seizure medications and abstaining from alcohol, no signs of DTs or withdrawal, case discussed with Dr. Amada Jupiter neurologist in detail on 09/27/2023 including her EEG report.  Resume home medications and discharge home.   Resume home medications, well with PT OT, symptom-free will be discharged  home with 1 month supply of seizure medications with 2 refills, outpatient follow-up with PCP and neurology.  Seizure instructions provided in writing.  Strictly counseled to be compliant with medications and abstain from alcohol.     Throat pain: Called with Cepacol lozenges   Alcohol use disorder: Denies going through withdrawals.  Drinks about sixpack of beer a day.  Counseled, no DTs   Hypophosphatemia and hypokalemia: Replaced.    Discharge diagnosis     Principal Problem:   Seizures (HCC) Active Problems:   Seizure [facility])    Discharge instructions    Discharge Instructions     Diet - low sodium heart healthy   Complete by: As directed    Discharge instructions   Complete by: As directed    Do not drive, operate heavy machinery, perform activities at heights, swimming or participation in water activities or provide baby sitting services until you have seen by Primary MD or a Neurologist and advised to do so again.  Follow with Primary MD Diamantina Providence, FNP in 7 days   Get CBC, CMP,Magnesium  -  checked next visit with your primary MD   Activity: As tolerated with Full fall precautions use walker/cane & assistance as needed  Disposition Home   Diet: Heart Healthy    Special Instructions: If you have smoked or chewed Tobacco  in the last 2 yrs please stop smoking, stop any regular Alcohol  and or any Recreational drug use.  On your next visit with your primary care physician please Get Medicines reviewed and adjusted.  Please request your Prim.MD to go over all Hospital Tests and Procedure/Radiological results at the follow up, please get all Hospital records sent to your Prim MD by signing hospital release before you go home.  If you experience worsening of your admission symptoms, develop shortness of breath, life threatening emergency, suicidal or homicidal thoughts you must seek medical attention immediately by calling 911 or calling your MD immediately  if  symptoms less severe.  You Must read complete instructions/literature along with all the possible adverse reactions/side effects for all the Medicines you take and that have been prescribed to you. Take any new Medicines after you have completely understood and accpet all the possible adverse reactions/side effects.   Do not drive when taking Pain medications.  Do not take more than prescribed Pain, Sleep and Anxiety Medications   Increase activity slowly   Complete by: As directed        Discharge Medications   Allergies as of 09/28/2023       Reactions   Keflex [cephalexin] Hives   Cephalosporins Hives   Prednisone Swelling   Toradol [ketorolac Tromethamine] Hives   Ultram [tramadol] Hives        Medication List     TAKE these medications    acetaminophen 500 MG tablet Commonly known as: TYLENOL Take 500 mg by mouth every 6 (six) hours as needed for moderate pain (pain score 4-6).   cyanocobalamin 1000 MCG tablet Take 1 tablet (1,000 mcg total) by mouth daily.   folic acid 1 MG tablet Commonly known as: FOLVITE Take 1 tablet (1 mg total) by mouth daily.   gabapentin 300 MG capsule Commonly known as: NEURONTIN Take 1 capsule (300 mg total) by mouth 3 (three) times daily.   lacosamide 50 MG Tabs tablet Commonly known as: VIMPAT Take 1 tablet (50 mg total) by mouth 2 (two) times daily.   levETIRAcetam 500 MG tablet Commonly known as: KEPPRA Take 1 tablet (500 mg total) by mouth 2 (two) times daily.   menthol-cetylpyridinium 3 MG lozenge Commonly known as: CEPACOL Take 1 lozenge (3 mg total) by mouth as needed for sore throat.   pantoprazole 40 MG tablet Commonly known as: Protonix Take 1 tablet (40 mg total) by mouth daily.   thiamine 100 MG tablet Commonly known as: Vitamin B-1 Take 1 tablet (100 mg total) by mouth daily.         Follow-up Information     Diamantina Providence, FNP. Schedule an appointment as soon as possible for a visit in 1  week(s).   Specialty: Nurse Practitioner Contact information: [address] [place] [address] [phone]         [facility]. Schedule an appointment as soon as possible for a visit in 1 week(s).   Contact information: [address]     [address] [place] [address] [phone]  Major procedures and Radiology Reports - PLEASE review detailed and final reports thoroughly  -      EEG adult  Result Date: 09/26/2023 Jefferson Fuel, MD     09/26/2023  1:36 PM Routine EEG Report Jatasia Elan Tetzloff is a 67 y.o. female with a history of seizure who is undergoing an EEG to evaluate for seizures. Report: This EEG was acquired with electrodes placed according to the International 10-20 electrode system (including Fp1, Fp2, F3, F4, C3, C4, P3, P4, O1, O2, T3, T4, T5, T6, A1, A2, Fz, Cz, Pz). The following electrodes were missing or displaced: none. The occipital dominant rhythm was 5-6 Hz. This activity is reactive to stimulation. Drowsiness was manifested by background fragmentation; deeper stages of sleep were not identified. There was no focal slowing. There were no interictal epileptiform discharges. There were no electrographic seizures identified. There was no abnormal response to photic stimulation or hyperventilation. Impression and clinical correlation: This EEG was obtained while awake and drowsy and is abnormal due to moderate diffuse slowing indicative of global cerebral dysfunction. Recording interpretation had significant myogenic artifact, but within that limitation no definitive epileptiform discharges were seen. Bing Neighbors, MD [facility] [phone] If 7pm- 7am, please page neurology on call as listed in AMION.   CT Head Wo Contrast  Result Date: 09/25/2023 CLINICAL DATA:  Two witnessed seizures.  Fell off the toilet. EXAM: CT HEAD WITHOUT CONTRAST CT CERVICAL SPINE WITHOUT CONTRAST  TECHNIQUE: Multidetector CT imaging of the head and cervical spine was performed following the standard protocol without intravenous contrast. Multiplanar CT image reconstructions of the cervical spine were also generated. RADIATION DOSE REDUCTION: This exam was performed according to the departmental dose-optimization program which includes automated exposure control, adjustment of the mA and/or kV according to patient size and/or use of iterative reconstruction technique. COMPARISON:  Head CT, 08/18/2023.  Cervical CT, 05/17/2022. FINDINGS: CT HEAD FINDINGS Brain: No evidence of acute infarction, hemorrhage, hydrocephalus, extra-axial collection or mass lesion/mass effect. Vascular: No hyperdense vessel or unexpected calcification. Skull: Normal. Negative for fracture or focal lesion. Sinuses/Orbits: Globes and orbits are unremarkable. Sinuses are clear. Other: None. CT CERVICAL SPINE FINDINGS Alignment: Straightened cervical lordosis.  No spondylolisthesis. Skull base and vertebrae: No acute fracture. No primary bone lesion or focal pathologic process. Soft tissues and spinal canal: No prevertebral fluid or swelling. No visible canal hematoma. Disc levels: Previous anterior cervical spine fusion, C4 through C7. Orthopedic hardware well-seated and stable. Mild loss of disc height at C3-C4 with moderate loss of disc height at C7-T1. No evidence of a disc herniation. Upper chest: No acute or significant abnormality. Other: None. IMPRESSION: HEAD CT 1. No acute intracranial abnormalities. CERVICAL CT 1. No fracture or acute finding. Electronically Signed   By: Amie Portland M.D.   On: 09/25/2023 12:16   CT Cervical Spine Wo Contrast  Result Date: 09/25/2023 CLINICAL DATA:  Two witnessed seizures.  Fell off the toilet. EXAM: CT HEAD WITHOUT CONTRAST CT CERVICAL SPINE WITHOUT CONTRAST TECHNIQUE: Multidetector CT imaging of the head and cervical spine was performed following the standard protocol without intravenous  contrast. Multiplanar CT image reconstructions of the cervical spine were also generated. RADIATION DOSE REDUCTION: This exam was performed according to the departmental dose-optimization program which includes automated exposure control, adjustment of the mA and/or kV according to patient size and/or use of iterative reconstruction technique. COMPARISON:  Head CT, 08/18/2023.  Cervical CT, 05/17/2022. FINDINGS: CT HEAD FINDINGS Brain: No evidence of acute infarction, hemorrhage, hydrocephalus,  extra-axial collection or mass lesion/mass effect. Vascular: No hyperdense vessel or unexpected calcification. Skull: Normal. Negative for fracture or focal lesion. Sinuses/Orbits: Globes and orbits are unremarkable. Sinuses are clear. Other: None. CT CERVICAL SPINE FINDINGS Alignment: Straightened cervical lordosis.  No spondylolisthesis. Skull base and vertebrae: No acute fracture. No primary bone lesion or focal pathologic process. Soft tissues and spinal canal: No prevertebral fluid or swelling. No visible canal hematoma. Disc levels: Previous anterior cervical spine fusion, C4 through C7. Orthopedic hardware well-seated and stable. Mild loss of disc height at C3-C4 with moderate loss of disc height at C7-T1. No evidence of a disc herniation. Upper chest: No acute or significant abnormality. Other: None. IMPRESSION: HEAD CT 1. No acute intracranial abnormalities. CERVICAL CT 1. No fracture or acute finding. Electronically Signed   By: Amie Portland M.D.   On: 09/25/2023 12:16   DG Chest Port 1 View  Result Date: 09/25/2023 CLINICAL DATA:  Seizure. EXAM: PORTABLE CHEST 1 VIEW COMPARISON:  08/18/2023 FINDINGS: Heart size appears normal. Lung volumes are low. Opacity within the periphery of the left base may represent atelectasis, small effusion or airspace disease. Right lung appears clear. Status post ACDF. Chronic appearing impaction injury involving the greater tuberosity of the proximal right humerus. IMPRESSION: 1.  Low lung volumes. 2. Opacity within the periphery of the left base may represent atelectasis, small effusion or airspace disease. Electronically Signed   By: Signa Kell M.D.   On: 09/25/2023 11:29    Micro Results    No results found for this or any previous visit (from the past 240 hour(s)).  Today   Subjective    [name] today has no headache,no chest abdominal pain,no new weakness tingling or numbness, feels much better wants to go home today.    Objective   Blood pressure 117/87, pulse 85, temperature 98.3 F (36.8 C), temperature source Oral, resp. rate 18, height 5\' 1"  (1.549 m), weight 64.4 kg, SpO2 98%.   Intake/Output Summary (Last 24 hours) at 09/28/2023 0747 Last data filed at 09/27/2023 1629 Gross per 24 hour  Intake 256.33 ml  Output --  Net 256.33 ml    Exam  Awake Alert, No new F.N deficits,    [place].AT,PERRAL Supple Neck,   Symmetrical Chest wall movement, Good air movement bilaterally, CTAB RRR,No Gallops,   +ve B.Sounds, Abd Soft, Non tender,  No Cyanosis, Clubbing or edema    Data Review   Recent Labs  Lab 09/25/23 1058 09/26/23 0403  WBC 10.4 9.1  HGB 14.4 13.7  HCT 42.1 40.1  PLT 245 196  MCV 95.9 96.9  MCH 32.8 33.1  MCHC 34.2 34.2  RDW 12.7 12.6  LYMPHSABS 0.5*  --   MONOABS 0.4  --   EOSABS 0.0  --   BASOSABS 0.0  --     Recent Labs  Lab 09/25/23 1058 09/26/23 0403 09/27/23 0548 09/28/23 0414  NA 130* 135 135 133*  K 3.3* 3.4* 3.1* 3.7  CL 86* 93* 96* 99  CO2 21* 27 24 24   ANIONGAP 23* 15 15 10   GLUCOSE 169* 96 98 99  BUN 15 12 12 8   CREATININE 1.02* 0.90 0.95 0.68  AST 44*  --   --   --   ALT 28  --   --   --   ALKPHOS 65  --   --   --   BILITOT 1.9*  --   --   --   ALBUMIN 4.2  --   --   --  MG 2.3  --  2.1  --   CALCIUM 9.4 9.1 9.0 8.9    Total Time in preparing paper work, data evaluation and todays exam - 35 minutes  Signature  -    Susa Raring M.D on 09/28/2023 at 7:47 AM   -  To page go to  [email or web address]

## 2023-09-28 NOTE — TOC Transition Note (Addendum)
Transition of Care [facility]) - CM/SW Discharge Note   Patient Details  Name: [name] MRN: [id number] Date of Birth: Jul 27, 1956  [facility]) CM/SW Contact:  Gordy Clement, RN Phone Number: 09/28/2023, 8:56 AM   Clinical Narrative:     Update:9:38 AM RNCM revisited patient and confirmed that patient is going to [facility] address [facility]) and not the [place] address. Cab voucher given. [facility] transport to [place] cancelled Resources for Alcohol abuse attached to DC paperwork- patient appreciative and stated she will "take it one day at a time"     Patient will DC to home .  Patient has no ride- Husband is disabled- [employer] given .  RNCM called [employer] .Fare quoted at $ 103.00. [facility] will provide patient ride home under cost center [phone].  Patient states Husband will be at resident when she gets home              Patient Goals and CMS Choice      Discharge Placement                         Discharge Plan and Services Additional resources added to the After Visit Summary for                                       Social Determinants of Health (SDOH) Interventions SDOH Screenings   Food Insecurity: Patient Declined (09/27/2023)  Housing: Patient Declined (09/27/2023)  Transportation Needs: Patient Declined (09/27/2023)  Utilities: Patient Declined (09/27/2023)  Alcohol Screen: Medium Risk (12/18/2018)  Depression (PHQ2-9): Medium Risk (05/10/2021)  Financial Resource Strain: Medium Risk (09/27/2018)  Physical Activity: Unknown (09/27/2018)  Social Connections: Unknown (09/27/2018)  Stress: No Stress Concern Present (09/27/2018)  Tobacco Use: High Risk (09/25/2023)     Readmission Risk Interventions     No data to display

## 2023-09-28 NOTE — Plan of Care (Signed)
Care Plan Implemented

## 2023-09-28 NOTE — TOC Transition Note (Signed)
Transition of Care [facility]) - CM/SW Discharge Note   Patient Details  Name: [name] MRN: [id number] Date of Birth: Dec 10, 1955  Transition of Care [facility]) CM/SW Contact:  Mearl Latin, LCSW Phone Number: 09/28/2023, 9:19 AM   Clinical Narrative:    CSW spoke with patient regarding ETOH consult. She stated she knows she needs to cut back and will but she has been stressed lately due to not having any relationship with her kids or grandkids. She declined resources from CSW and stated that she is well aware of places like AA meetings and where they are. She stated she would follow up with her PCP for her medications. She stated she did not have a ride home. CSW asked if she was going to the address on her Facesheet in [place] and she stated, "Lord, no, that's my daughter's address. I stay in [place] at the [facility] [place]." CSW confirmed EMS run sheet address of the [facility] at [address] [place], [address]. RNCM contacting [employer] transport for pickup.          Patient Goals and CMS Choice      Discharge Placement                         Discharge Plan and Services Additional resources added to the After Visit Summary for                                       Social Determinants of Health (SDOH) Interventions SDOH Screenings   Food Insecurity: Patient Declined (09/27/2023)  Housing: Patient Declined (09/27/2023)  Transportation Needs: Patient Declined (09/27/2023)  Utilities: Patient Declined (09/27/2023)  Alcohol Screen: Medium Risk (12/18/2018)  Depression (PHQ2-9): Medium Risk (05/10/2021)  Financial Resource Strain: Medium Risk (09/27/2018)  Physical Activity: Unknown (09/27/2018)  Social Connections: Unknown (09/27/2018)  Stress: No Stress Concern Present (09/27/2018)  Tobacco Use: High Risk (09/25/2023)     Readmission Risk Interventions     No data to display

## 2023-09-29 ENCOUNTER — Telehealth: Payer: Self-pay

## 2023-09-29 NOTE — Transitions of Care (Post Inpatient/ED Visit) (Signed)
   09/29/2023  Name: [name] MRN: [id number] DOB: 05-Jul-1956  Today's TOC FU Call Status: Today's TOC FU Call Status:: Unsuccessful Call (1st Attempt) Unsuccessful Call (1st Attempt) Date: 09/29/23  Attempted to reach the patient regarding the most recent Inpatient/ED visit.  Follow Up Plan: Additional outreach attempts will be made to reach the patient to complete the Transitions of Care (Post Inpatient/ED visit) call.   Alyse Low, RN, BA, [facility], CRRN [facility] Coordinator, Transition of Care Ph # [phone]

## 2023-09-30 ENCOUNTER — Telehealth: Payer: Self-pay

## 2023-09-30 NOTE — Transitions of Care (Post Inpatient/ED Visit) (Signed)
   09/30/2023  Name: [name] MRN: [id number] DOB: 11-05-1955  Today's TOC FU Call Status: Today's TOC FU Call Status:: Unsuccessful Call (2nd Attempt) Unsuccessful Call (2nd Attempt) Date: 09/30/23  Attempted to reach the patient regarding the most recent Inpatient/ED visit.  Follow Up Plan: Additional outreach attempts will be made to reach the patient to complete the Transitions of Care (Post Inpatient/ED visit) call.   Alyse Low, RN, BA, [facility], CRRN [facility] Coordinator, Transition of Care Ph # [phone]

## 2023-10-01 ENCOUNTER — Telehealth: Payer: Self-pay

## 2023-10-01 NOTE — Transitions of Care (Post Inpatient/ED Visit) (Unsigned)
   10/01/2023  Name: [name] MRN: [id number] DOB: 07-18-1956  Today's TOC FU Call Status: Today's TOC FU Call Status:: Unsuccessful Call (3rd Attempt) Unsuccessful Call (3rd Attempt) Date: 10/01/23  Attempted to reach the patient regarding the most recent Inpatient/ED visit.  Follow Up Plan: No further outreach attempts will be made at this time. We have been unable to contact the patient.  Alyse Low, RN, BA, [facility], CRRN [facility] Coordinator, Transition of Care Ph # [phone]

## 2023-10-19 ENCOUNTER — Other Ambulatory Visit (HOSPITAL_COMMUNITY): Payer: Self-pay

## 2023-10-21 ENCOUNTER — Other Ambulatory Visit (HOSPITAL_COMMUNITY): Payer: Self-pay

## 2023-10-22 ENCOUNTER — Emergency Department (HOSPITAL_COMMUNITY): Payer: 59

## 2023-10-22 ENCOUNTER — Encounter (HOSPITAL_COMMUNITY): Payer: Self-pay | Admitting: Emergency Medicine

## 2023-10-22 ENCOUNTER — Emergency Department (HOSPITAL_COMMUNITY)
Admission: EM | Admit: 2023-10-22 | Discharge: 2023-10-23 | Disposition: A | Discharge status: Home / Self Care | Payer: 59 | Attending: Emergency Medicine | Admitting: Emergency Medicine

## 2023-10-22 ENCOUNTER — Other Ambulatory Visit (HOSPITAL_COMMUNITY): Payer: Self-pay

## 2023-10-22 DIAGNOSIS — R1032 Left lower quadrant pain: Secondary | ICD-10-CM | POA: Diagnosis present

## 2023-10-22 DIAGNOSIS — M545 Low back pain, unspecified: Secondary | ICD-10-CM | POA: Insufficient documentation

## 2023-10-22 DIAGNOSIS — K5792 Diverticulitis of intestine, part unspecified, without perforation or abscess without bleeding: Secondary | ICD-10-CM

## 2023-10-22 DIAGNOSIS — G8929 Other chronic pain: Secondary | ICD-10-CM | POA: Insufficient documentation

## 2023-10-22 DIAGNOSIS — K5732 Diverticulitis of large intestine without perforation or abscess without bleeding: Secondary | ICD-10-CM | POA: Diagnosis not present

## 2023-10-22 LAB — COMPREHENSIVE METABOLIC PANEL
ALT: 10 U/L (ref 0–44)
AST: 16 U/L (ref 15–41)
Albumin: 4 g/dL (ref 3.5–5.0)
Alkaline Phosphatase: 57 U/L (ref 38–126)
Anion gap: 10 (ref 5–15)
BUN: <5 mg/dL — ABNORMAL LOW (ref 8–23)
CO2: 22 mmol/L (ref 22–32)
Calcium: 9.6 mg/dL (ref 8.9–10.3)
Chloride: 107 mmol/L (ref 98–111)
Creatinine, Ser: 0.53 mg/dL (ref 0.44–1.00)
GFR, Estimated: >60 mL/min (ref >60)
Glucose, Bld: 88 mg/dL (ref 70–99)
Potassium: 3.5 mmol/L (ref 3.5–5.1)
Sodium: 139 mmol/L (ref 135–145)
Total Bilirubin: 0.5 mg/dL (ref 0.0–1.2)
Total Protein: 7.2 g/dL (ref 6.5–8.1)

## 2023-10-22 LAB — CBC WITH DIFFERENTIAL/PLATELET
Abs Immature Granulocytes: 0.02 10*3/uL (ref 0.00–0.07)
Basophils Absolute: 0 10*3/uL (ref 0.0–0.1)
Basophils Relative: 1 %
Eosinophils Absolute: 0.2 10*3/uL (ref 0.0–0.5)
Eosinophils Relative: 3 %
HCT: 40.6 % (ref 36.0–46.0)
Hemoglobin: 13.3 g/dL (ref 12.0–15.0)
Immature Granulocytes: 0 %
Lymphocytes Relative: 38 %
Lymphs Abs: 2.5 10*3/uL (ref 0.7–4.0)
MCH: 32.8 pg (ref 26.0–34.0)
MCHC: 32.8 g/dL (ref 30.0–36.0)
MCV: 100.2 fL — ABNORMAL HIGH (ref 80.0–100.0)
Monocytes Absolute: 0.4 10*3/uL (ref 0.1–1.0)
Monocytes Relative: 6 %
Neutro Abs: 3.5 10*3/uL (ref 1.7–7.7)
Neutrophils Relative %: 52 %
Platelets: 318 10*3/uL (ref 150–400)
RBC: 4.05 MIL/uL (ref 3.87–5.11)
RDW: 12.2 % (ref 11.5–15.5)
WBC: 6.6 10*3/uL (ref 4.0–10.5)
nRBC: 0 % (ref 0.0–0.2)

## 2023-10-22 LAB — URINALYSIS, ROUTINE W REFLEX MICROSCOPIC
Bilirubin Urine: NEGATIVE
Glucose, UA: NEGATIVE mg/dL
Hgb urine dipstick: NEGATIVE
Ketones, ur: NEGATIVE mg/dL
Leukocytes,Ua: NEGATIVE
Nitrite: NEGATIVE
Protein, ur: NEGATIVE mg/dL
Specific Gravity, Urine: 1.002 — ABNORMAL LOW (ref 1.005–1.030)
pH: 6 (ref 5.0–8.0)

## 2023-10-22 LAB — LIPASE, BLOOD: Lipase: 48 U/L (ref 11–51)

## 2023-10-22 MED ORDER — METHOCARBAMOL 500 MG PO TABS
500.0000 mg | ORAL_TABLET | Freq: Two times a day (BID) | ORAL | 0 refills | Status: DC
  Filled 2023-10-22: qty 20, 10d supply, fill #0

## 2023-10-22 MED ORDER — GABAPENTIN 300 MG PO CAPS: 300.0000 mg | ORAL_CAPSULE | Freq: Three times a day (TID) | ORAL | 0 refills | Status: DC

## 2023-10-22 MED ORDER — IOHEXOL 300 MG/ML  SOLN
80.0000 mL | Freq: Once | INTRAMUSCULAR | Status: AC | PRN
  Administered 2023-10-22: 80 mL via INTRAVENOUS

## 2023-10-22 MED ORDER — METRONIDAZOLE 500 MG PO TABS
500.0000 mg | ORAL_TABLET | Freq: Two times a day (BID) | ORAL | 0 refills | Status: AC
  Filled 2023-10-22: qty 10, 5d supply, fill #0

## 2023-10-22 MED ORDER — LIDOCAINE 5 % EX PTCH
1.0000 | MEDICATED_PATCH | CUTANEOUS | Status: DC
  Administered 2023-10-22: 1 via TRANSDERMAL
  Filled 2023-10-22 (×2): qty 1

## 2023-10-22 MED ORDER — MORPHINE SULFATE (PF) 4 MG/ML IV SOLN
4.0000 mg | Freq: Once | INTRAVENOUS | Status: AC
  Administered 2023-10-22: 4 mg via INTRAVENOUS
  Filled 2023-10-22: qty 1

## 2023-10-22 MED ORDER — LACOSAMIDE 50 MG PO TABS: 50.0000 mg | ORAL_TABLET | Freq: Two times a day (BID) | ORAL | 0 refills | Status: DC

## 2023-10-22 MED ORDER — CIPROFLOXACIN HCL 500 MG PO TABS
500.0000 mg | ORAL_TABLET | Freq: Two times a day (BID) | ORAL | 0 refills | Status: DC
  Filled 2023-10-22: qty 10, 5d supply, fill #0

## 2023-10-22 MED ORDER — HYDROCODONE-ACETAMINOPHEN 5-325 MG PO TABS
1.0000 | ORAL_TABLET | Freq: Once | ORAL | Status: AC
  Administered 2023-10-22: 1 via ORAL
  Filled 2023-10-22: qty 1

## 2023-10-22 NOTE — ED Provider Triage Note (Signed)
 Emergency Medicine Provider Triage Evaluation Note  Ishana Blades , a 68 y.o. female  was evaluated in triage.  Pt complains of left lower quadrant abdominal pain and back pain x 1 week.  Notes she has chronic back pain.  No current nausea, vomiting, or diarrhea.  Previous history of diverticulitis years ago.  Stopped drinking Alcohol  3 weeks ago. Admits to increased urinary frequency. No dysuria or hematuria.   Review of Systems  Positive: Abdominal pain Negative: hematuria  Physical Exam  BP 138/78   Pulse 60   Temp (!) 97.5 F (36.4 C) (Oral)   Resp 18   Ht 5' 1 (1.549 m)   Wt 64 kg   SpO2 100%   BMI 26.66 kg/m  Gen:   Awake, no distress   Resp:  Normal effort  MSK:   Moves extremities without difficulty  Other:  TTP in LLQ  Medical Decision Making  Medically screening exam initiated at 4:35 PM.  Appropriate orders placed.  Clorissa Yeraldi Fidler was informed that the remainder of the evaluation will be completed by another provider, this initial triage assessment does not replace that evaluation, and the importance of remaining in the ED until their evaluation is complete.  Labs CT   Lorelle Aleck BROCKS, [place] 10/22/23 8362

## 2023-10-22 NOTE — ED Notes (Addendum)
 Pt requesting insurance be used to call cab. Pt ambulated from wheelchair to toilet and back without difficulty.

## 2023-10-22 NOTE — Discharge Instructions (Addendum)
 It was a pleasure taking care of you today.  As discussed, your CT scan showed diverticulitis which is likely causing your abdominal pain.  I am sending you home with 2 different antibiotics.  Take as prescribed and finish all antibiotics.  I am also sending you home with a muscle relaxer for your low back pain.  Take as needed.  Muscle relaxer can cause drowsiness so do not drive or operate machinery while on the medication.  Please follow-up with PCP on Monday for a recheck.  Return to the ER for any worsening symptoms.  I have included the number of the GI doctor. Call to schedule an appointment for further evaluation.

## 2023-10-22 NOTE — ED Provider Notes (Signed)
 [place] EMERGENCY DEPARTMENT AT [facility] Provider Note   CSN: [id number] Arrival date & time: 10/22/23  1558     History  No chief complaint on file.   [name] is a 68 y.o. female with a past medical history significant for alcohol  abuse however, stopped drinking 3 weeks ago, seizures, anxiety, depression who presents to the ED due to left lower quadrant abdominal pain and low back pain.  Patient admits to chronic low back pain for the past 3 years however, has gradually worsened.  States her abdominal pain has been present for the past week.  Denies nausea, vomiting, diarrhea ever since stopping drinking 3 weeks ago.  Admits some increased urinary frequency however, no dysuria.  No hematuria.  Denies vaginal discharge.  No fever or chills.  Denies saddle anesthesia, bowel/bladder incontinence, lower extremity numbness/tingling.  Does admit to some lower extremity weakness secondary to pain. No recent injury to low back. Has had a history of diverticulitis.  History obtained from patient and past medical records. No interpreter used during encounter.       Home Medications Prior to Admission medications   Medication Sig Start Date End Date Taking? Authorizing Provider  ciprofloxacin  (CIPRO ) 500 MG tablet Take 1 tablet (500 mg total) by mouth every 12 (twelve) hours. 10/22/23  Yes Ernesta Trabert, Aleck BROCKS, PA-C  methocarbamol  (ROBAXIN ) 500 MG tablet Take 1 tablet (500 mg total) by mouth 2 (two) times daily. 10/22/23  Yes Brittay Mogle C, PA-C  metroNIDAZOLE  (FLAGYL ) 500 MG tablet Take 1 tablet (500 mg total) by mouth 2 (two) times daily for 5 days. 10/22/23 10/27/23 Yes Tavien Chestnut, Aleck BROCKS, PA-C  acetaminophen  (TYLENOL ) 500 MG tablet Take 500 mg by mouth every 6 (six) hours as needed for moderate pain (pain score 4-6).    [provider]  cyanocobalamin  1000 MCG tablet Take 1 tablet (1,000 mcg total) by mouth daily. 09/28/23   Singh, Prashant K, MD  folic acid   (FOLVITE ) 1 MG tablet Take 1 tablet (1 mg total) by mouth daily. 09/28/23   Singh, Prashant K, MD  gabapentin  (NEURONTIN ) 300 MG capsule Take 1 capsule (300 mg total) by mouth 3 (three) times daily. 09/28/23   Singh, Prashant K, MD  lacosamide  (VIMPAT ) 50 MG TABS tablet Take 1 tablet (50 mg total) by mouth 2 (two) times daily. 09/28/23   Singh, Prashant K, MD  levETIRAcetam  (KEPPRA ) 500 MG tablet Take 1 tablet (500 mg total) by mouth 2 (two) times daily. 09/28/23   Singh, Prashant K, MD  menthol -cetylpyridinium (CEPACOL) 3 MG lozenge Take 1 lozenge (3 mg total) by mouth as needed for sore throat. 09/28/23   Dennise Lavada POUR, MD  pantoprazole  (PROTONIX ) 40 MG tablet Take 1 tablet (40 mg total) by mouth daily. 08/19/23 10/18/23  Ghimire, Donalda HERO, MD  thiamine  (VITAMIN B1) 100 MG tablet Take 1 tablet (100 mg total) by mouth daily. 09/28/23   Dennise Lavada POUR, MD  DULoxetine  (CYMBALTA ) 30 MG capsule Take 1 capsule (30 mg total) by mouth daily. Patient not taking: Reported on 09/01/2020 06/11/20 09/02/20  Money, Caron NOVAK, FNP  metoCLOPramide  (REGLAN ) 10 MG tablet Take 1 tablet (10 mg total) by mouth every 6 (six) hours as needed for nausea (nausea/headache). 04/06/19 05/24/19  Mesner, Selinda, MD      Allergies    Keflex  [cephalexin ], Cephalosporins, Prednisone , Toradol  [ketorolac  tromethamine ], and Ultram  [tramadol ]    Review of Systems   Review of Systems  Constitutional:  Negative for fever.  Respiratory:  Negative for shortness of breath.   Cardiovascular:  Negative for chest pain.  Gastrointestinal:  Positive for abdominal pain. Negative for diarrhea, nausea and vomiting.  Musculoskeletal:  Positive for back pain.    Physical Exam Updated Vital Signs BP (!) 148/86 (BP Location: Left Arm)   Pulse 60   Temp 97.8 F (36.6 C) (Oral)   Resp 18   Ht 5' 1 (1.549 m)   Wt 64 kg   SpO2 100%   BMI 26.66 kg/m  Physical Exam Vitals and nursing note reviewed.  Constitutional:      General:  She is not in acute distress.    Appearance: She is not ill-appearing.  HENT:     Head: Normocephalic.  Eyes:     Pupils: Pupils are equal, round, and reactive to light.  Cardiovascular:     Rate and Rhythm: Normal rate and regular rhythm.     Pulses: Normal pulses.     Heart sounds: Normal heart sounds. No murmur heard.    No friction rub. No gallop.  Pulmonary:     Effort: Pulmonary effort is normal.     Breath sounds: Normal breath sounds.  Abdominal:     General: Abdomen is flat. There is no distension.     Palpations: Abdomen is soft.     Tenderness: There is abdominal tenderness. There is no guarding or rebound.     Comments: LLQ tenderness  Musculoskeletal:        General: Normal range of motion.     Cervical back: Neck supple.     Comments: Bilateral lower extremities neurovascularly intact.   Skin:    General: Skin is warm and dry.  Neurological:     General: No focal deficit present.     Mental Status: She is alert.  Psychiatric:        Mood and Affect: Mood normal.        Behavior: Behavior normal.     ED Results / Procedures / Treatments   Labs (all labs ordered are listed, but only abnormal results are displayed) Labs Reviewed  CBC WITH DIFFERENTIAL/PLATELET - Abnormal; Notable for the following components:      Result Value   MCV 100.2 (*)    All other components within normal limits  COMPREHENSIVE METABOLIC PANEL - Abnormal; Notable for the following components:   BUN <5 (*)    All other components within normal limits  URINALYSIS, ROUTINE W REFLEX MICROSCOPIC - Abnormal; Notable for the following components:   Color, Urine STRAW (*)    Specific Gravity, Urine 1.002 (*)    All other components within normal limits  LIPASE, BLOOD    EKG None  Radiology CT L-SPINE NO CHARGE Result Date: 10/22/2023 CLINICAL DATA:  Left lower quadrant pain, back pain EXAM: CT LUMBAR SPINE WITHOUT CONTRAST TECHNIQUE: Multidetector CT imaging of the lumbar spine was  performed without intravenous contrast administration. Multiplanar CT image reconstructions were also generated. RADIATION DOSE REDUCTION: This exam was performed according to the departmental dose-optimization program which includes automated exposure control, adjustment of the mA and/or kV according to patient size and/or use of iterative reconstruction technique. COMPARISON:  CT abdomen and pelvis 07/21/2021 FINDINGS: Segmentation: 5 lumbar type vertebrae. Alignment: Normal Vertebrae: Prominent Schmorl's nodes noted in the inferior endplates of L1 and L2. No fracture or suspicious focal bone abnormality. Paraspinal and other soft tissues: Negative Disc levels: Degenerative disc disease most pronounced in the mid lumbar spine with disc space narrowing and anterior  spurring. Mild bilateral degenerative facet disease throughout the lumbar spine. IMPRESSION: No acute bony abnormality. Degenerative disc and facet disease. Electronically Signed   By: Franky Crease M.D.   On: 10/22/2023 22:25   CT ABDOMEN PELVIS W CONTRAST Result Date: 10/22/2023 CLINICAL DATA:  Left lower quadrant pain, back pain EXAM: CT ABDOMEN AND PELVIS WITH CONTRAST TECHNIQUE: Multidetector CT imaging of the abdomen and pelvis was performed using the standard protocol following bolus administration of intravenous contrast. RADIATION DOSE REDUCTION: This exam was performed according to the departmental dose-optimization program which includes automated exposure control, adjustment of the mA and/or kV according to patient size and/or use of iterative reconstruction technique. CONTRAST:  80mL OMNIPAQUE  IOHEXOL  300 MG/ML  SOLN COMPARISON:  07/21/2021 FINDINGS: Lower chest: No acute abnormality Hepatobiliary: No focal liver abnormality is seen. Status post cholecystectomy. No biliary dilatation. Pancreas: No focal abnormality or ductal dilatation. Spleen: No focal abnormality.  Normal size. Adrenals/Urinary Tract: No adrenal abnormality. No focal  renal abnormality. No stones or hydronephrosis. Urinary bladder is unremarkable. Stomach/Bowel: Left colonic diverticulosis. Slight stranding adjacent to the mid descending colon suggesting early active diverticulitis. No focal fluid collection. Stomach and small bowel decompressed, unremarkable. Appendix normal. Vascular/Lymphatic: No evidence of aneurysm or adenopathy. Reproductive: Prior hysterectomy.  No adnexal masses. Other: No free fluid or free air. Musculoskeletal: No acute bony abnormality. IMPRESSION: Left colonic diverticulosis. Slight inflammatory stranding adjacent to the mid descending colon suggesting early active diverticulitis. Electronically Signed   By: Franky Crease M.D.   On: 10/22/2023 22:22    Procedures Procedures    Medications Ordered in ED Medications  lidocaine  (LIDODERM ) 5 % 1 patch (1 patch Transdermal Patch Applied 10/22/23 2127)  HYDROcodone -acetaminophen  (NORCO/VICODIN) 5-325 MG per tablet 1 tablet (has no administration in time range)  iohexol  (OMNIPAQUE ) 300 MG/ML solution 80 mL (80 mLs Intravenous Contrast Given 10/22/23 2108)  morphine  (PF) 4 MG/ML injection 4 mg (4 mg Intravenous Given 10/22/23 2127)    ED Course/ Medical Decision Making/ A&P                                 Medical Decision Making Amount and/or Complexity of Data Reviewed Labs: ordered. Decision-making details documented in ED Course. Radiology: ordered and independent interpretation performed. Decision-making details documented in ED Course.  Risk Prescription drug management.   This patient presents to the ED for concern of abdominal pain, this involves an extensive number of treatment options, and is a complaint that carries with it a high risk of complications and morbidity.  The differential diagnosis includes diverticulitis, bowel obstruction, bile nephritis, kidney stone, etc  68 year old female presents to the ED due to left lower quadrant abdominal pain and back pain x 1 week.   Has chronic back pain for the past 3 years however, has worsened over the past week.  No recent injury.  Denies nausea, vomiting, and diarrhea.  History of alcohol  abuse however, stopped drinking alcohol  3 weeks ago.  History of diverticulitis.  Upon arrival, stable vitals.  Patient in no acute distress.  Tenderness throughout left lower quadrant.  Bilateral lower extremities neurovascularly intact with soft compartments.  Low suspicion for cauda equina or central cord compression.  No infectious symptoms to suggest infectious etiology.  CT abdomen ordered to rule out evidence of diverticulitis.  Will add no charge CT L-spine due to significant pain.  Routine labs ordered.  IV morphine  given.  CBC reassuring.  No  leukocytosis.  Normal hemoglobin.  CMP reassuring.  Normal creatinine.  No major electrolyte derangements.  Normal LFTs.  Lipase normal.  Low suspicion for pancreatitis.  UA negative for signs of infection.  Low suspicion for pyelonephritis.  CT abdomen personally reviewed and interpreted which demonstrates early acute diverticulitis.  Will treat with antibiotics.  CT lumbar spine demonstrates degenerative disease however, no acute abnormalities.  Upon reassessment, patient with some improvement in back pain.  Low suspicion for cauda equina or central cord compression.  Advised patient to follow-up with PCP for further evaluation of low back pain.  May need referral to pain management.  GI number given to patient discharge advised to call to schedule appointment for further evaluation.  Patient stable for discharge. Strict ED precautions discussed with patient. Patient states understanding and agrees to plan. Patient discharged home in no acute distress and stable vitals  Co morbidities that complicate the patient evaluation  Chronic back pain, alcohol  abuse   Social Determinants of Health:  Chronic pain  Discussed with Dr. Zackowski who agrees with assessment and plan.         Final  Clinical Impression(s) / ED Diagnoses Final diagnoses:  Diverticulitis  Acute on chronic low back pain    Rx / DC Orders ED Discharge Orders          Ordered    metroNIDAZOLE  (FLAGYL ) 500 MG tablet  2 times daily        10/22/23 2253    ciprofloxacin  (CIPRO ) 500 MG tablet  Every 12 hours        10/22/23 2253    methocarbamol  (ROBAXIN ) 500 MG tablet  2 times daily        10/22/23 2253              Masiah Lewing C, PA-C 10/22/23 2313    Zackowski, Scott, MD 10/22/23 [phone]

## 2023-10-22 NOTE — ED Triage Notes (Signed)
 Pt arrives via EMS from home with LLQ abd pain and back pain x1 week. Describes pain as an ache. Normal BM. Reports quitting alcohol 3 weeks ago.

## 2023-10-23 ENCOUNTER — Other Ambulatory Visit (HOSPITAL_COMMUNITY): Payer: Self-pay

## 2023-10-23 DIAGNOSIS — K5732 Diverticulitis of large intestine without perforation or abscess without bleeding: Secondary | ICD-10-CM | POA: Diagnosis not present

## 2023-10-23 MED ORDER — METHOCARBAMOL 500 MG PO TABS: 500.0000 mg | ORAL_TABLET | Freq: Two times a day (BID) | ORAL | 0 refills | Status: DC

## 2023-10-23 MED ORDER — CIPROFLOXACIN HCL 500 MG PO TABS: 500.0000 mg | ORAL_TABLET | Freq: Two times a day (BID) | ORAL | 0 refills | Status: DC

## 2023-10-23 MED ORDER — GABAPENTIN 300 MG PO CAPS: 300.0000 mg | ORAL_CAPSULE | Freq: Three times a day (TID) | ORAL | 0 refills | Status: DC

## 2023-10-23 MED ORDER — METRONIDAZOLE 500 MG PO TABS: 500.0000 mg | ORAL_TABLET | Freq: Two times a day (BID) | ORAL | 0 refills | Status: DC

## 2023-10-23 MED ORDER — LACOSAMIDE 50 MG PO TABS: 50.0000 mg | ORAL_TABLET | Freq: Two times a day (BID) | ORAL | Status: DC

## 2023-10-23 MED ORDER — CIPROFLOXACIN HCL 500 MG PO TABS
500.0000 mg | ORAL_TABLET | Freq: Once | ORAL | Status: AC
  Administered 2023-10-23: 500 mg via ORAL
  Filled 2023-10-23: qty 1

## 2023-10-23 MED ORDER — METRONIDAZOLE 500 MG PO TABS
500.0000 mg | ORAL_TABLET | Freq: Once | ORAL | Status: AC
  Administered 2023-10-23: 500 mg via ORAL
  Filled 2023-10-23: qty 1

## 2023-10-23 NOTE — ED Notes (Signed)
 [facility] called

## 2023-11-01 ENCOUNTER — Telehealth: Payer: Self-pay

## 2023-11-01 ENCOUNTER — Other Ambulatory Visit (HOSPITAL_COMMUNITY): Payer: Self-pay

## 2023-11-01 NOTE — Progress Notes (Signed)
 Transition Care Management Unsuccessful Follow-up Telephone Call  Date of discharge and from where:  10/23/2023 [facility]  Attempts:  1st Attempt  Reason for unsuccessful TCM follow-up call:  No answer/busy  Brandy Zuba [facility]  [facility], [facility] Resource Care Guide Direct Dial: [phone]  Website: [email or web address]

## 2023-11-02 ENCOUNTER — Telehealth: Payer: Self-pay

## 2023-11-02 NOTE — Progress Notes (Signed)
 Transition Care Management Unsuccessful Follow-up Telephone Call  Date of discharge and from where:  10/23/2023 [facility]  Attempts:  2nd Attempt  Reason for unsuccessful TCM follow-up call:  No answer/busy  Karolee Meloni [facility]  [facility], [facility] Guide Direct Dial: [phone]  Website: [email or web address]

## 2023-11-17 ENCOUNTER — Other Ambulatory Visit (HOSPITAL_COMMUNITY): Payer: Self-pay

## 2023-12-27 ENCOUNTER — Ambulatory Visit: Payer: 59 | Admitting: Family Medicine

## 2023-12-28 ENCOUNTER — Encounter: Payer: Self-pay | Admitting: Neurology

## 2023-12-28 ENCOUNTER — Ambulatory Visit: Payer: 59 | Admitting: Neurology

## 2024-01-12 ENCOUNTER — Emergency Department (HOSPITAL_COMMUNITY)

## 2024-01-12 ENCOUNTER — Inpatient Hospital Stay (HOSPITAL_COMMUNITY)

## 2024-01-12 ENCOUNTER — Other Ambulatory Visit (HOSPITAL_COMMUNITY)

## 2024-01-12 ENCOUNTER — Inpatient Hospital Stay (HOSPITAL_COMMUNITY)
Admission: EM | Admit: 2024-01-12 | Discharge: 2024-01-20 | DRG: 100 | Disposition: A | Discharge status: Home / Self Care | Attending: Family Medicine | Admitting: Family Medicine

## 2024-01-12 ENCOUNTER — Other Ambulatory Visit: Payer: Self-pay

## 2024-01-12 ENCOUNTER — Encounter (HOSPITAL_COMMUNITY): Payer: Self-pay | Admitting: Pulmonary Disease

## 2024-01-12 DIAGNOSIS — J9811 Atelectasis: Secondary | ICD-10-CM | POA: Diagnosis present

## 2024-01-12 DIAGNOSIS — G8929 Other chronic pain: Secondary | ICD-10-CM | POA: Diagnosis present

## 2024-01-12 DIAGNOSIS — T4275XA Adverse effect of unspecified antiepileptic and sedative-hypnotic drugs, initial encounter: Secondary | ICD-10-CM | POA: Diagnosis not present

## 2024-01-12 DIAGNOSIS — E16A1 Hypoglycemia level 1: Secondary | ICD-10-CM | POA: Diagnosis not present

## 2024-01-12 DIAGNOSIS — D696 Thrombocytopenia, unspecified: Secondary | ICD-10-CM | POA: Diagnosis present

## 2024-01-12 DIAGNOSIS — N179 Acute kidney failure, unspecified: Secondary | ICD-10-CM | POA: Diagnosis present

## 2024-01-12 DIAGNOSIS — Z881 Allergy status to other antibiotic agents status: Secondary | ICD-10-CM

## 2024-01-12 DIAGNOSIS — R45851 Suicidal ideations: Secondary | ICD-10-CM | POA: Diagnosis present

## 2024-01-12 DIAGNOSIS — Z792 Long term (current) use of antibiotics: Secondary | ICD-10-CM

## 2024-01-12 DIAGNOSIS — R1310 Dysphagia, unspecified: Secondary | ICD-10-CM | POA: Diagnosis present

## 2024-01-12 DIAGNOSIS — I952 Hypotension due to drugs: Secondary | ICD-10-CM | POA: Diagnosis not present

## 2024-01-12 DIAGNOSIS — F600 Paranoid personality disorder: Secondary | ICD-10-CM | POA: Diagnosis present

## 2024-01-12 DIAGNOSIS — J690 Pneumonitis due to inhalation of food and vomit: Secondary | ICD-10-CM | POA: Diagnosis present

## 2024-01-12 DIAGNOSIS — Z91148 Patient's other noncompliance with medication regimen for other reason: Secondary | ICD-10-CM | POA: Diagnosis not present

## 2024-01-12 DIAGNOSIS — F1024 Alcohol dependence with alcohol-induced mood disorder: Secondary | ICD-10-CM | POA: Diagnosis present

## 2024-01-12 DIAGNOSIS — Z781 Physical restraint status: Secondary | ICD-10-CM

## 2024-01-12 DIAGNOSIS — G9340 Encephalopathy, unspecified: Secondary | ICD-10-CM

## 2024-01-12 DIAGNOSIS — Z79899 Other long term (current) drug therapy: Secondary | ICD-10-CM | POA: Diagnosis not present

## 2024-01-12 DIAGNOSIS — I609 Nontraumatic subarachnoid hemorrhage, unspecified: Secondary | ICD-10-CM

## 2024-01-12 DIAGNOSIS — F332 Major depressive disorder, 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 psychotic features: Secondary | ICD-10-CM | POA: Diagnosis present

## 2024-01-12 DIAGNOSIS — F17210 Nicotine dependence, cigarettes, uncomplicated: Secondary | ICD-10-CM | POA: Diagnosis present

## 2024-01-12 DIAGNOSIS — Z880 Allergy status to penicillin: Secondary | ICD-10-CM

## 2024-01-12 DIAGNOSIS — R0902 Hypoxemia: Secondary | ICD-10-CM

## 2024-01-12 DIAGNOSIS — Z885 Allergy status to narcotic agent status: Secondary | ICD-10-CM

## 2024-01-12 DIAGNOSIS — J9601 Acute respiratory failure with hypoxia: Secondary | ICD-10-CM | POA: Diagnosis present

## 2024-01-12 DIAGNOSIS — R000 Tachycardia, unspecified: Secondary | ICD-10-CM | POA: Diagnosis not present

## 2024-01-12 DIAGNOSIS — R569 Unspecified convulsions: Secondary | ICD-10-CM | POA: Diagnosis not present

## 2024-01-12 DIAGNOSIS — G40901 Epilepsy, unspecified, not intractable, with status epilepticus: Secondary | ICD-10-CM | POA: Diagnosis not present

## 2024-01-12 DIAGNOSIS — E876 Hypokalemia: Secondary | ICD-10-CM | POA: Diagnosis not present

## 2024-01-12 DIAGNOSIS — Z888 Allergy status to other drugs, medicaments and biological substances status: Secondary | ICD-10-CM

## 2024-01-12 DIAGNOSIS — Z90710 Acquired absence of both cervix and uterus: Secondary | ICD-10-CM

## 2024-01-12 DIAGNOSIS — I10 Essential (primary) hypertension: Secondary | ICD-10-CM | POA: Diagnosis present

## 2024-01-12 DIAGNOSIS — Z5901 Sheltered homelessness: Secondary | ICD-10-CM | POA: Diagnosis not present

## 2024-01-12 DIAGNOSIS — F1020 Alcohol dependence, uncomplicated: Secondary | ICD-10-CM | POA: Diagnosis present

## 2024-01-12 DIAGNOSIS — G9341 Metabolic encephalopathy: Secondary | ICD-10-CM | POA: Diagnosis not present

## 2024-01-12 LAB — CBC WITH DIFFERENTIAL/PLATELET
Abs Immature Granulocytes: 0.07 10*3/uL (ref 0.00–0.07)
Basophils Absolute: 0 10*3/uL (ref 0.0–0.1)
Basophils Relative: 0 %
Eosinophils Absolute: 0 10*3/uL (ref 0.0–0.5)
Eosinophils Relative: 0 %
HCT: 44 % (ref 36.0–46.0)
Hemoglobin: 14.2 g/dL (ref 12.0–15.0)
Immature Granulocytes: 1 %
Lymphocytes Relative: 4 %
Lymphs Abs: 0.4 10*3/uL — ABNORMAL LOW (ref 0.7–4.0)
MCH: 34.5 pg — ABNORMAL HIGH (ref 26.0–34.0)
MCHC: 32.3 g/dL (ref 30.0–36.0)
MCV: 106.8 fL — ABNORMAL HIGH (ref 80.0–100.0)
Monocytes Absolute: 0.5 10*3/uL (ref 0.1–1.0)
Monocytes Relative: 5 %
Neutro Abs: 8.4 10*3/uL — ABNORMAL HIGH (ref 1.7–7.7)
Neutrophils Relative %: 90 %
Platelets: 274 10*3/uL (ref 150–400)
RBC: 4.12 MIL/uL (ref 3.87–5.11)
RDW: 15.5 % (ref 11.5–15.5)
WBC: 9.4 10*3/uL (ref 4.0–10.5)
nRBC: 0 % (ref 0.0–0.2)

## 2024-01-12 LAB — I-STAT ARTERIAL BLOOD GAS, ED
Acid-base deficit: 1 mmol/L (ref 0.0–2.0)
Bicarbonate: 24.9 mmol/L (ref 20.0–28.0)
Calcium, Ion: 1.21 mmol/L (ref 1.15–1.40)
HCT: 38 % (ref 36.0–46.0)
Hemoglobin: 12.9 g/dL (ref 12.0–15.0)
O2 Saturation: 94 %
Potassium: 3.2 mmol/L — ABNORMAL LOW (ref 3.5–5.1)
Sodium: 140 mmol/L (ref 135–145)
TCO2: 26 mmol/L (ref 22–32)
pCO2 arterial: 45.5 mmHg (ref 32–48)
pH, Arterial: 7.346 — ABNORMAL LOW (ref 7.35–7.45)
pO2, Arterial: 74 mmHg — ABNORMAL LOW (ref 83–108)

## 2024-01-12 LAB — COMPREHENSIVE METABOLIC PANEL
ALT: 14 U/L (ref 0–44)
AST: 40 U/L (ref 15–41)
Albumin: 3.9 g/dL (ref 3.5–5.0)
Alkaline Phosphatase: 63 U/L (ref 38–126)
Anion gap: 16 — ABNORMAL HIGH (ref 5–15)
BUN: 5 mg/dL — ABNORMAL LOW (ref 8–23)
CO2: 23 mmol/L (ref 22–32)
Calcium: 9.1 mg/dL (ref 8.9–10.3)
Chloride: 100 mmol/L (ref 98–111)
Creatinine, Ser: 1.01 mg/dL — ABNORMAL HIGH (ref 0.44–1.00)
GFR, Estimated: >60 mL/min (ref >60)
Glucose, Bld: 107 mg/dL — ABNORMAL HIGH (ref 70–99)
Potassium: 2.8 mmol/L — ABNORMAL LOW (ref 3.5–5.1)
Sodium: 139 mmol/L (ref 135–145)
Total Bilirubin: 0.5 mg/dL (ref 0.0–1.2)
Total Protein: 7 g/dL (ref 6.5–8.1)

## 2024-01-12 LAB — I-STAT VENOUS BLOOD GAS, ED
Acid-base deficit: 5 mmol/L — ABNORMAL HIGH (ref 0.0–2.0)
Bicarbonate: 22.9 mmol/L (ref 20.0–28.0)
Calcium, Ion: 1.1 mmol/L — ABNORMAL LOW (ref 1.15–1.40)
HCT: 45 % (ref 36.0–46.0)
Hemoglobin: 15.3 g/dL — ABNORMAL HIGH (ref 12.0–15.0)
O2 Saturation: 72 %
Potassium: 2.7 mmol/L — CL (ref 3.5–5.1)
Sodium: 138 mmol/L (ref 135–145)
TCO2: 25 mmol/L (ref 22–32)
pCO2, Ven: 52.7 mmHg (ref 44–60)
pH, Ven: 7.247 — ABNORMAL LOW (ref 7.25–7.43)
pO2, Ven: 44 mmHg (ref 32–45)

## 2024-01-12 LAB — GLUCOSE, CAPILLARY
Glucose-Capillary: 115 mg/dL — ABNORMAL HIGH (ref 70–99)
Glucose-Capillary: 59 mg/dL — ABNORMAL LOW (ref 70–99)
Glucose-Capillary: 65 mg/dL — ABNORMAL LOW (ref 70–99)
Glucose-Capillary: 76 mg/dL (ref 70–99)

## 2024-01-12 LAB — CREATININE, SERUM
Creatinine, Ser: 0.61 mg/dL (ref 0.44–1.00)
GFR, Estimated: >60 mL/min (ref >60)

## 2024-01-12 LAB — RAPID URINE DRUG SCREEN, HOSP PERFORMED
Amphetamines: NOT DETECTED
Barbiturates: NOT DETECTED
Benzodiazepines: POSITIVE — AB
Cocaine: NOT DETECTED
Opiates: NOT DETECTED
Tetrahydrocannabinol: NOT DETECTED

## 2024-01-12 LAB — MRSA NEXT GEN BY PCR, NASAL: MRSA by PCR Next Gen: NOT DETECTED

## 2024-01-12 LAB — MAGNESIUM: Magnesium: 1.4 mg/dL — ABNORMAL LOW (ref 1.7–2.4)

## 2024-01-12 LAB — CBG MONITORING, ED
Glucose-Capillary: 127 mg/dL — ABNORMAL HIGH (ref 70–99)
Glucose-Capillary: 94 mg/dL (ref 70–99)

## 2024-01-12 LAB — TSH: TSH: 0.976 u[IU]/mL (ref 0.350–4.500)

## 2024-01-12 LAB — AMMONIA: Ammonia: 29 umol/L (ref 9–35)

## 2024-01-12 LAB — ETHANOL: Alcohol, Ethyl (B): <10 mg/dL (ref <10)

## 2024-01-12 MED ORDER — MIDAZOLAM BOLUS VIA INFUSION
0.0000 mg | INTRAVENOUS | Status: DC | PRN
  Administered 2024-01-13: 2 mg via INTRAVENOUS

## 2024-01-12 MED ORDER — FENTANYL 2500MCG IN NS 250ML (10MCG/ML) PREMIX INFUSION
25.0000 ug/h | INTRAVENOUS | Status: DC
  Administered 2024-01-12: 25 ug/h via INTRAVENOUS
  Filled 2024-01-12 (×2): qty 250

## 2024-01-12 MED ORDER — MIDAZOLAM-SODIUM CHLORIDE 100-0.9 MG/100ML-% IV SOLN
0.0000 mg/h | INTRAVENOUS | Status: DC
  Administered 2024-01-12: 2 mg/h via INTRAVENOUS
  Filled 2024-01-12 (×2): qty 100

## 2024-01-12 MED ORDER — ORAL CARE MOUTH RINSE: 15.0000 mL | OROMUCOSAL | Status: DC | PRN

## 2024-01-12 MED ORDER — ORAL CARE MOUTH RINSE
15.0000 mL | OROMUCOSAL | Status: DC
  Administered 2024-01-12 – 2024-01-14 (×28): 15 mL via OROMUCOSAL

## 2024-01-12 MED ORDER — DEXTROSE 50 % IV SOLN
12.5000 g | INTRAVENOUS | Status: AC
  Administered 2024-01-12: 12.5 g via INTRAVENOUS

## 2024-01-12 MED ORDER — LEVETIRACETAM IN NACL 500 MG/100ML IV SOLN
500.0000 mg | Freq: Two times a day (BID) | INTRAVENOUS | Status: DC
  Administered 2024-01-12 – 2024-01-15 (×8): 500 mg via INTRAVENOUS
  Filled 2024-01-12 (×9): qty 100

## 2024-01-12 MED ORDER — THIAMINE MONONITRATE 100 MG PO TABS: 100.0000 mg | ORAL_TABLET | Freq: Every day | ORAL | Status: DC

## 2024-01-12 MED ORDER — CHLORHEXIDINE GLUCONATE CLOTH 2 % EX PADS
6.0000 | MEDICATED_PAD | Freq: Every day | CUTANEOUS | Status: DC
  Administered 2024-01-12 – 2024-01-15 (×4): 6 via TOPICAL

## 2024-01-12 MED ORDER — ENOXAPARIN SODIUM 40 MG/0.4ML IJ SOSY
40.0000 mg | PREFILLED_SYRINGE | INTRAMUSCULAR | Status: DC
  Administered 2024-01-12 – 2024-01-20 (×9): 40 mg via SUBCUTANEOUS
  Filled 2024-01-12 (×8): qty 0.4

## 2024-01-12 MED ORDER — ACETAMINOPHEN 325 MG PO TABS
650.0000 mg | ORAL_TABLET | ORAL | Status: DC | PRN
  Administered 2024-01-16: 650 mg
  Filled 2024-01-12: qty 2

## 2024-01-12 MED ORDER — LACTATED RINGERS IV BOLUS
500.0000 mL | Freq: Once | INTRAVENOUS | Status: AC
  Administered 2024-01-12: 500 mL via INTRAVENOUS

## 2024-01-12 MED ORDER — ONDANSETRON HCL 4 MG/2ML IJ SOLN: 4.0000 mg | Freq: Four times a day (QID) | INTRAMUSCULAR | Status: DC | PRN

## 2024-01-12 MED ORDER — FENTANYL BOLUS VIA INFUSION
25.0000 ug | INTRAVENOUS | Status: DC | PRN
  Administered 2024-01-12 (×2): 25 ug via INTRAVENOUS
  Administered 2024-01-13 (×3): 100 ug via INTRAVENOUS

## 2024-01-12 MED ORDER — SODIUM CHLORIDE 0.9 % IV SOLN
50.0000 mg | Freq: Two times a day (BID) | INTRAVENOUS | Status: DC
  Administered 2024-01-12 – 2024-01-16 (×9): 50 mg via INTRAVENOUS
  Filled 2024-01-12 (×12): qty 5

## 2024-01-12 MED ORDER — GABAPENTIN 300 MG PO CAPS
300.0000 mg | ORAL_CAPSULE | Freq: Three times a day (TID) | ORAL | Status: DC
  Administered 2024-01-12 – 2024-01-14 (×8): 300 mg
  Filled 2024-01-12 (×8): qty 1

## 2024-01-12 MED ORDER — DEXTROSE 50 % IV SOLN
12.5000 g | INTRAVENOUS | Status: AC
  Administered 2024-01-12: 12.5 g via INTRAVENOUS
  Filled 2024-01-12: qty 50

## 2024-01-12 MED ORDER — POLYETHYLENE GLYCOL 3350 17 G PO PACK
17.0000 g | PACK | Freq: Every day | ORAL | Status: DC
  Administered 2024-01-12 – 2024-01-14 (×2): 17 g
  Filled 2024-01-12 (×2): qty 1

## 2024-01-12 MED ORDER — POTASSIUM CHLORIDE 10 MEQ/100ML IV SOLN
10.0000 meq | INTRAVENOUS | Status: AC
  Administered 2024-01-12 (×2): 10 meq via INTRAVENOUS
  Filled 2024-01-12 (×2): qty 100

## 2024-01-12 MED ORDER — SODIUM CHLORIDE 0.9 % IV BOLUS
1000.0000 mL | Freq: Once | INTRAVENOUS | Status: AC
  Administered 2024-01-12: 1000 mL via INTRAVENOUS

## 2024-01-12 MED ORDER — FAMOTIDINE 20 MG PO TABS
20.0000 mg | ORAL_TABLET | Freq: Two times a day (BID) | ORAL | Status: DC
  Administered 2024-01-12 – 2024-01-14 (×5): 20 mg
  Filled 2024-01-12 (×5): qty 1

## 2024-01-12 MED ORDER — ETOMIDATE 2 MG/ML IV SOLN
20.0000 mg | Freq: Once | INTRAVENOUS | Status: AC
  Administered 2024-01-12: 20 mg via INTRAVENOUS

## 2024-01-12 MED ORDER — SODIUM CHLORIDE 0.9 % IV SOLN
2000.0000 mg | Freq: Once | INTRAVENOUS | Status: AC
  Administered 2024-01-12: 2000 mg via INTRAVENOUS
  Filled 2024-01-12: qty 20

## 2024-01-12 MED ORDER — SUCCINYLCHOLINE CHLORIDE 200 MG/10ML IV SOSY
100.0000 mg | PREFILLED_SYRINGE | Freq: Once | INTRAVENOUS | Status: AC
  Administered 2024-01-12: 100 mg via INTRAVENOUS

## 2024-01-12 MED ORDER — SODIUM CHLORIDE 0.9 % IV SOLN
3.0000 g | Freq: Four times a day (QID) | INTRAVENOUS | Status: DC
  Administered 2024-01-12 – 2024-01-16 (×16): 3 g via INTRAVENOUS
  Filled 2024-01-12 (×17): qty 8

## 2024-01-12 MED ORDER — ALBUTEROL SULFATE (2.5 MG/3ML) 0.083% IN NEBU: 2.5000 mg | INHALATION_SOLUTION | RESPIRATORY_TRACT | Status: DC | PRN

## 2024-01-12 MED ORDER — THIAMINE MONONITRATE 100 MG PO TABS
100.0000 mg | ORAL_TABLET | Freq: Every day | ORAL | Status: DC
  Administered 2024-01-12 – 2024-01-14 (×3): 100 mg
  Filled 2024-01-12 (×3): qty 1

## 2024-01-12 MED ORDER — FOLIC ACID 1 MG PO TABS
1.0000 mg | ORAL_TABLET | Freq: Every day | ORAL | Status: DC
  Administered 2024-01-12 – 2024-01-14 (×3): 1 mg
  Filled 2024-01-12 (×3): qty 1

## 2024-01-12 MED ORDER — LACTATED RINGERS IV SOLN: INTRAVENOUS | Status: AC

## 2024-01-12 MED ORDER — INSULIN ASPART 100 UNIT/ML IJ SOLN: 0.0000 [IU] | INTRAMUSCULAR | Status: DC

## 2024-01-12 NOTE — Progress Notes (Signed)
 Pt transported on vent from [facility] to 4N29 without any compilations. RN at bedside.

## 2024-01-12 NOTE — ED Triage Notes (Addendum)
 Patient coming from [facility]. Patient has hx of seizure. Patient had 2 seizures prior to patient arrival. Patient had 2 seizures for EMS. 7.5mg  total of versed was given. Patient unresponsive on arrival

## 2024-01-12 NOTE — Consult Note (Signed)
 NEUROLOGY CONSULT NOTE   Date of service: January 12, 2024 Patient Name: [name] MRN:  [id number] DOB:  1955/11/09 Chief Complaint: "Seizures" Requesting Provider: Linwood Dibbles, MD  History of Present Illness  [name] is a 68 y.o. female with hx of seizures dating lacosamide.  She was supposed to be picking up medications today, unclear if she ran out or not.  She has a known history of epilepsy, and takes both Keppra as well as lacosamide.  She has reported in the past that she only got her medication from the hospital, unclear who was prescribing it for her currently.  Unfortunately, the phone numbers for family in the chart are not accurate to try and establish compliance.  According to her pharmacy fill history, she has not filled her lacosamide or her Keppra since January 4 when she got a 30-day supply  She was reported to have multiple seizures at home.  EMS witnessed two seizures, she was given Versed by EMS.  On arrival to the emergency department, she was no longer actively seizing, but was still essentially unresponsive.  In the emergency department, she was doing okay on a nonrebreather, but then her respiratory status declined and she was intubated.  Past History   Past Medical History:  Diagnosis Date   Alcohol dependence (HCC)    Anxiety    Chronic pain    Depression    Diverticulitis    Herniated cervical disc    Pancreatitis    Seizures (HCC)    alcoholic seizures    Past Surgical History:  Procedure Laterality Date   ABDOMINAL HYSTERECTOMY     ANKLE SURGERY Right    CARPAL TUNNEL RELEASE Bilateral    CERVICAL FUSION     CHOLECYSTECTOMY     KNEE SURGERY      Family History: Family History  Family history unknown: Yes    Social History  reports that she has been smoking cigarettes. She has a 1.8 pack-year smoking history. She has never used smokeless tobacco. She reports current alcohol use. She reports that she does not currently  use drugs after having used the following drugs: Marijuana.  Allergies  Allergen Reactions   Keflex [Cephalexin] Hives   Cephalosporins Hives   Prednisone Swelling   Toradol [Ketorolac Tromethamine] Hives   Ultram [Tramadol] Hives    Medications   Current Facility-Administered Medications:    fentaNYL (SUBLIMAZE) bolus via infusion 25-100 mcg, 25-100 mcg, Intravenous, Q15 min PRN, Linwood Dibbles, MD   fentaNYL in NS (28mcg/ml) infusion-PREMIX, 25-200 mcg/hr, Intravenous, Continuous, Linwood Dibbles, MD, Last Rate: 2.5 mL/hr at 01/12/24 1005, 25 mcg/hr at 01/12/24 1005   lacosamide (VIMPAT) tablet 50 mg, 50 mg, Oral, BID,    midazolam (VERSED) 100 mg/100 mL (1 mg/mL) premix infusion, 0-10 mg/hr, Intravenous, Continuous, Linwood Dibbles, MD, Last Rate: 2 mL/hr at 01/12/24 1011, 2 mg/hr at 01/12/24 1011   midazolam (VERSED) bolus via infusion 0-5 mg, 0-5 mg, Intravenous, Q1H PRN, Linwood Dibbles, MD   potassium chloride 10 mEq in 100 mL IVPB, 10 mEq, Intravenous, Q1 Hr x 2, Linwood Dibbles, MD, Last Rate: 100 mL/hr at 01/12/24 0945, 10 mEq at 01/12/24 0945   thiamine (VITAMIN B1) tablet 100 mg, 100 mg, Oral, Daily, Linwood Dibbles, MD  Current Outpatient Medications:    acetaminophen (TYLENOL) 500 MG tablet, Take 500 mg by mouth every 6 (six) hours as needed for moderate pain (pain score 4-6)., Disp: , Rfl:    ciprofloxacin (CIPRO) 500 MG  tablet, Take 1 tablet (500 mg total) by mouth every 12 (twelve) hours., Disp: 10 tablet, Rfl: 0   ciprofloxacin (CIPRO) 500 MG tablet, Take 1 tablet (500 mg total) by mouth 2 (two) times daily., Disp: 14 tablet, Rfl: 0   cyanocobalamin 1000 MCG tablet, Take 1 tablet (1,000 mcg total) by mouth daily., Disp: 30 tablet, Rfl: 2   folic acid (FOLVITE) 1 MG tablet, Take 1 tablet (1 mg total) by mouth daily., Disp: 30 tablet, Rfl: 2   gabapentin (NEURONTIN) 300 MG capsule, Take 1 capsule (300 mg total) by mouth 3 (three) times daily., Disp: 90 capsule, Rfl: 0   gabapentin  (NEURONTIN) 300 MG capsule, Take 1 capsule (300 mg total) by mouth 3 (three) times daily., Disp: 90 capsule, Rfl: 0   lacosamide (VIMPAT) 50 MG TABS tablet, Take 1 tablet (50 mg total) by mouth 2 (two) times daily., Disp: 60 tablet, Rfl: 0   levETIRAcetam (KEPPRA) 500 MG tablet, Take 1 tablet (500 mg total) by mouth 2 (two) times daily., Disp: 60 tablet, Rfl: 2   menthol-cetylpyridinium (CEPACOL) 3 MG lozenge, Take 1 lozenge (3 mg total) by mouth as needed for sore throat., Disp: 100 tablet, Rfl: 12   methocarbamol (ROBAXIN) 500 MG tablet, Take 1 tablet (500 mg total) by mouth 2 (two) times daily., Disp: 20 tablet, Rfl: 0   methocarbamol (ROBAXIN) 500 MG tablet, Take 1 tablet (500 mg total) by mouth 2 (two) times daily., Disp: 20 tablet, Rfl: 0   metroNIDAZOLE (FLAGYL) 500 MG tablet, Take 1 tablet (500 mg total) by mouth 2 (two) times daily., Disp: 14 tablet, Rfl: 0   pantoprazole (PROTONIX) 40 MG tablet, Take 1 tablet (40 mg total) by mouth daily., Disp: 30 tablet, Rfl: 1   thiamine (VITAMIN B1) 100 MG tablet, Take 1 tablet (100 mg total) by mouth daily., Disp: 30 tablet, Rfl: 2  Vitals   Vitals:   2024/01/27 0830 January 27, 2024 0838 01/27/24 0839 27-Jan-2024 0840  BP: 96/62     Pulse: (!) 106 (!) 106 (!) 102 (!) 102  Resp:  (!) 21 (!) 22 (!) 23  Temp:      TempSrc:      SpO2: 92% 94% 94% 94%  Weight:      Height:        Body mass index is 26.66 kg/m.  Physical Exam   Intubated, recently paralyzed. Neurologic Examination   She is paralyzed, her pupils are reactive bilaterally, but further exams precluded by paralytic.  Labs/Imaging/Neurodiagnostic studies   CBC:  Recent Labs  Lab 01-27-24 0759 01/27/2024 0814  WBC 9.4  --   NEUTROABS 8.4*  --   HGB 14.2 15.3*  HCT 44.0 45.0  MCV 106.8*  --   PLT 274  --    Basic Metabolic Panel:  Lab Results  Component Value Date   NA 138 2024/01/27   K 2.7 (LL) Jan 27, 2024   CO2 23 27-Jan-2024   GLUCOSE 107 (H) 01-27-24   BUN 5 (L)  Jan 27, 2024   CREATININE 1.01 (H) 27-Jan-2024   CALCIUM 9.1 Jan 27, 2024   GFRNONAA >60 01-27-2024   GFRAA >60 06/09/2020   Lipid Panel:  Lab Results  Component Value Date   LDLCALC 58 06/09/2020   HgbA1c:  Lab Results  Component Value Date   HGBA1C 5.0 05/17/2022   Urine Drug Screen:     Component Value Date/Time   LABOPIA NONE DETECTED 08/18/2023 1329   COCAINSCRNUR NONE DETECTED 08/18/2023 1329   LABBENZ POSITIVE (A) 08/18/2023 1329  AMPHETMU NONE DETECTED 08/18/2023 1329   THCU NONE DETECTED 08/18/2023 1329   LABBARB NONE DETECTED 08/18/2023 1329    Alcohol Level     Component Value Date/Time   ETH <10 01/12/2024 0759   INR  Lab Results  Component Value Date   INR 1.0 05/17/2022   APTT  Lab Results  Component Value Date   APTT 22 (L) 05/17/2022   AED levels:  Lab Results  Component Value Date   PHENYTOIN <2.5 (L) 10/17/2022   LEVETIRACETA <2.0 (L) 09/25/2023    CT Head without contrast(Personally reviewed):  ASSESSMENT   Bethzaida Skyah Hannon is a 68 y.o. female with a history of seizures with likely medication noncompliance who is presenting with multiple seizures without return to baseline (status epilepticus).  She has been intubated and will need continuous EEG monitoring to ensure that she has stopped seizing and is not in nonconvulsive status epilepticus.  I would favor continuous EEG as we wean sedatives towards extubation.  She is on three antiepileptics, gabapentin, lacosamide, and Keppra.  RECOMMENDATIONS  Overnight EEG Continue Keppra 500 twice daily (home dose) Gabapentin 300 3 times daily (home dose) Lacosamide 50 mg twice daily (home dose) Neurology will follow  ______________________________________________________________________   This patient is critically ill and at significant risk of neurological worsening, death and care requires constant monitoring of vital signs, hemodynamics,respiratory and cardiac monitoring, neurological  assessment, discussion with family, other specialists and medical decision making of high complexity. I spent 35 minutes of neurocritical care time  in the care of  this patient. This was time spent independent of any time provided by nurse practitioner or PA.  Ritta Slot, MD [facility]   If 7pm- 7am, please page neurology on call as listed in AMION. 01/12/2024  10:33 AM

## 2024-01-12 NOTE — Progress Notes (Signed)
 Pharmacy Antibiotic Note  [name] is a 68 y.o. female admitted on 01/12/2024 and is not being treated for  aspiration pneumonia . Bronch completed with visualization with large, thick, white secretions. Pharmacy has been consulted for Unaysn dosing.  Plan: Unasyn 3 grams IV every 6 hours Monitor fever curve, WBC, and micro data  Height: 5\' 1"  (154.9 cm) Weight: 64 kg (141 lb 1.5 oz) IBW/kg (Calculated) : 47.8  Temp (24hrs), Avg:96.3 F (35.7 C), Min:96.3 F (35.7 C), Max:96.3 F (35.7 C)  Recent Labs  Lab 01/12/24 0759  WBC 9.4  CREATININE 1.01*    Estimated Creatinine Clearance: 45.7 mL/min (A) (by C-G formula based on SCr of 1.01 mg/dL (H)).    Allergies  Allergen Reactions   Keflex [Cephalexin] Hives   Cephalosporins Hives   Prednisone Swelling   Toradol [Ketorolac Tromethamine] Hives   Ultram [Tramadol] Hives    Antimicrobials this admission: Unasyn 3/26 >>  Dose adjustments this admission:  Microbiology results: 3/26 MRSA PCR: negative  Thank you for allowing pharmacy to be a part of this patient's care.  Wilmer Floor 01/12/2024 12:04 PM

## 2024-01-12 NOTE — H&P (Signed)
 NAME:  [name], MRN:  [id number], DOB:  1956-08-05, LOS: 0 ADMISSION DATE:  01/12/2024, CONSULTATION DATE:  01/12/24 REFERRING MD:  Dr. Lynelle Doctor, CHIEF COMPLAINT:  AMS   History of Present Illness:  Pt encephalopathic, HPI obtained from EMR  82 yoF w/ PMH significant for seizures with med non-compliance, ETOH abuse, anxiety/ depression presented by EMS for AMS.  Witnessed seizure x 2 with EMS s/p versed.  On arrival to ER, no further seizure activity but required intubation for airway protection and O2 desaturations.  CTH neg.  Loaded with keppra and vimat started.  Sedated on fentanyl/ versed gtt.  Neurology consulted.  Labs show AKI, ETOH neg, K 2.7.  PCCM to admit.   Pertinent  Medical History   Past Medical History:  Diagnosis Date   Alcohol dependence (HCC)    Anxiety    Chronic pain    Depression    Diverticulitis    Herniated cervical disc    Pancreatitis    Seizures (HCC)    alcoholic seizures   Significant Hospital Events: Including procedures, antibiotic start and stop dates in addition to other pertinent events   3/26 admit, intubated   Interim History / Subjective:   Objective   Blood pressure 96/62, pulse 67, temperature (!) 96.3 F (35.7 C), temperature source Rectal, resp. rate 18, height 5\' 1"  (1.549 m), weight 64 kg, SpO2 99%.    Vent Mode: PRVC FiO2 (%):  [100 %] 100 % Set Rate:  [18 bmp] 18 bmp Vt Set:  [380 mL] 380 mL PEEP:  [8 cmH20] 8 cmH20 Plateau Pressure:  [24 cmH20] 24 cmH20  No intake or output data in the 24 hours ending 01/12/24 1102 Filed Weights   01/12/24 0756  Weight: 64 kg    Examination: General:  critically ill older female on MV in NAD HEENT: MM pink/moist, ETT 7.5/ 23 at lip, pupils 2/r, anicteric, copious  Neuro: no eye opening, minimal flicker in LE to noxious stimuli  CV: rr, NSR, heart sounds difficult over rales/ rhonchi PULM:  MV support, diffuse rales/ rhonchi, moderate whitish secretions, +cough, plat 24, dp  15 GI: soft, bs hypo, ND, foley -cyu Extremities: warm/dry, no LE edema  Skin: no rashes   7.346/ 45/74/24.9 on FiO2 1.0/ 8 peep Labs> MCV 106, K 2.8, BUN/ sCr 5/ 1.01  Resolved Hospital Problem list    Assessment & Plan:   Acute encephalopathy, 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  Monitor clinically - empiric thiamine/ folate, monitor for possible withdrawal    Acute hypoxic respiratory insufficiency related to above Presumed aspiration RLL PNA/ pneumonitis +/- atelectasis - PF ratio 78 P:  - questionable atelectasis vs RLL collapse, s/p bronch in ER> see separate note.  Thick white secretions, seem to be thinning.  May need HTS, CPT - cont full MV support, 4-8cc/kg IBW with goal Pplat <30 and DP<15  - VAP prevention protocol/ PPI - PAD protocol for sedation> fentanyl/ versed gtt> titrate pending LTM, bowel regimen - CXR in am, prn ABG - wean PEEP/ FiO2 as able for SpO2 >92% - aggressive pulmonary hygiene - prn albuterol - empiric Unasyn coverage, check  MRSA PCR    Hypokalemia AKI - s/p fluid bolus in ER.  Cont MIVF LR  - UA - KCL replete as ordered.  Check Mag - cont foley - trend renal indices  - strict I/Os, daily wts - avoid nephrotoxins, renal dose meds, hemodynamic support as above   Best Practice (right click and "Reselect all SmartList Selections" daily)   Diet/type: NPO DVT prophylaxis prophylactic heparin  Pressure ulcer(s): N/A GI prophylaxis: PPI Lines: N/A Foley:  Yes, and it is still needed Code Status:  full code Last date of multidisciplinary goals of care discussion [pending]  NOK>  daughter's number invalid, SO, [name] [phone] no answer/ VM, other number not in service  Labs   CBC: Recent Labs  Lab 01/12/24 0759 01/12/24 0814 01/12/24 1050  WBC 9.4  --   --   NEUTROABS 8.4*  --   --   HGB 14.2 15.3* 12.9  HCT 44.0 45.0 38.0  MCV 106.8*  --   --   PLT 274  --   --     Basic Metabolic Panel: Recent Labs  Lab 01/12/24 0759 01/12/24 0814 01/12/24 1050  NA 139 138 140  K 2.8* 2.7* 3.2*  CL 100  --   --   CO2 23  --   --   GLUCOSE 107*  --   --   BUN 5*  --   --   CREATININE 1.01*  --   --   CALCIUM 9.1  --   --    GFR: Estimated Creatinine Clearance: 45.7 mL/min (A) (by C-G formula based on SCr of 1.01 mg/dL (H)). Recent Labs  Lab 01/12/24 0759  WBC 9.4    Liver Function Tests: Recent Labs  Lab 01/12/24 0759  AST 40  ALT 14  ALKPHOS 63  BILITOT 0.5  PROT 7.0  ALBUMIN 3.9   No results for input(s): "LIPASE", "AMYLASE" in the last 168 hours. No results for input(s): "AMMONIA" in the last 168 hours.  ABG    Component Value Date/Time   PHART 7.346 (L) 01/12/2024 1050   PCO2ART 45.5 01/12/2024 1050   PO2ART 74 (L) 01/12/2024 1050   HCO3 24.9 01/12/2024 1050   TCO2 26 01/12/2024 1050   ACIDBASEDEF 1.0 01/12/2024 1050   O2SAT 94 01/12/2024 1050     Coagulation Profile: No results for input(s): "INR", "PROTIME" in the last 168 hours.  Cardiac Enzymes: No results for input(s): "CKTOTAL", "CKMB", "CKMBINDEX", "TROPONINI" in the last 168 hours.  HbA1C: Hgb A1c MFr Bld  Date/Time Value Ref Range Status  05/17/2022 03:38 PM 5.0 4.8 - 5.6 % Final    Comment:    (NOTE) Pre diabetes:          5.7%-6.4%  Diabetes:              >6.4%  Glycemic control for   <7.0% adults with diabetes   06/09/2020 01:16 AM 5.3 4.8 - 5.6 % Final    Comment:    (NOTE) Pre diabetes:          5.7%-6.4%  Diabetes:              >6.4%  Glycemic control for   <7.0% adults with diabetes     CBG: Recent Labs  Lab 01/12/24 0808   GLUCAP 127*    Review of Systems:   unable  Past Medical History:  She,  has a past medical history of Alcohol dependence (HCC), Anxiety, Chronic pain, Depression, Diverticulitis, Herniated cervical disc,  Pancreatitis, and Seizures (HCC).   Surgical History:   Past Surgical History:  Procedure Laterality Date   ABDOMINAL HYSTERECTOMY     ANKLE SURGERY Right    CARPAL TUNNEL RELEASE Bilateral    CERVICAL FUSION     CHOLECYSTECTOMY     KNEE SURGERY       Social History:   reports that she has been smoking cigarettes. She has a 1.8 pack-year smoking history. She has never used smokeless tobacco. She reports current alcohol use. She reports that she does not currently use drugs after having used the following drugs: Marijuana.   Family History:  Her Family history is unknown by patient.   Allergies Allergies  Allergen Reactions   Keflex [Cephalexin] Hives   Cephalosporins Hives   Prednisone Swelling   Toradol [Ketorolac Tromethamine] Hives   Ultram [Tramadol] Hives     Home Medications  Prior to Admission medications   Medication Sig Start Date End Date Taking? Authorizing Provider  acetaminophen (TYLENOL) 500 MG tablet Take 500 mg by mouth every 6 (six) hours as needed for moderate pain (pain score 4-6).    [provider]  ciprofloxacin (CIPRO) 500 MG tablet Take 1 tablet (500 mg total) by mouth every 12 (twelve) hours. 10/22/23   Mannie Stabile, PA-C  ciprofloxacin (CIPRO) 500 MG tablet Take 1 tablet (500 mg total) by mouth 2 (two) times daily. 10/23/23   Jacalyn Lefevre, MD  cyanocobalamin 1000 MCG tablet Take 1 tablet (1,000 mcg total) by mouth daily. 09/28/23   Leroy Sea, MD  folic acid (FOLVITE) 1 MG tablet Take 1 tablet (1 mg total) by mouth daily. 09/28/23   Leroy Sea, MD  gabapentin (NEURONTIN) 300 MG capsule Take 1 capsule (300 mg total) by mouth 3 (three) times daily. 10/22/23   Mannie Stabile, PA-C  gabapentin (NEURONTIN) 300 MG  capsule Take 1 capsule (300 mg total) by mouth 3 (three) times daily. 10/23/23   Jacalyn Lefevre, MD  lacosamide (VIMPAT) 50 MG TABS tablet Take 1 tablet (50 mg total) by mouth 2 (two) times daily. 10/22/23   Mannie Stabile, PA-C  levETIRAcetam (KEPPRA) 500 MG tablet Take 1 tablet (500 mg total) by mouth 2 (two) times daily. 09/28/23   Leroy Sea, MD  menthol-cetylpyridinium (CEPACOL) 3 MG lozenge Take 1 lozenge (3 mg total) by mouth as needed for sore throat. 09/28/23   Leroy Sea, MD  methocarbamol (ROBAXIN) 500 MG tablet Take 1 tablet (500 mg total) by mouth 2 (two) times daily. 10/22/23   Mannie Stabile, PA-C  methocarbamol (ROBAXIN) 500 MG tablet Take 1 tablet (500 mg total) by mouth 2 (two) times daily. 10/23/23   Jacalyn Lefevre, MD  metroNIDAZOLE (FLAGYL) 500 MG tablet Take 1 tablet (500 mg total) by mouth 2 (two) times daily. 10/23/23   Jacalyn Lefevre, MD  pantoprazole (PROTONIX) 40 MG tablet Take 1 tablet (40 mg total) by mouth daily. 08/19/23 10/18/23  Maretta Bees, MD  thiamine (VITAMIN B1) 100 MG tablet Take 1 tablet (100 mg total) by mouth daily. 09/28/23   Leroy Sea, MD  DULoxetine (CYMBALTA) 30 MG capsule Take 1 capsule (30 mg total) by mouth daily. Patient not taking: Reported on 09/01/2020 06/11/20 09/02/20  Money, [name], FNP  metoCLOPramide (REGLAN) 10 MG tablet Take 1 tablet (10 mg total) by mouth every 6 (six) hours as needed for nausea (nausea/headache). 04/06/19 05/24/19  Mesner, Barbara Cower, MD     Critical care time: 50 mins  Posey Boyer, MSN, AG-ACNP-BC [facility] 01/12/2024, 11:02 AM  See Amion for pager If no response to pager , please call [phone] until 7pm After 7:00 pm call Elink  [phone]

## 2024-01-12 NOTE — ED Provider Notes (Addendum)
 [place] EMERGENCY DEPARTMENT AT [facility] Provider Note   CSN: [id number] Arrival date & time: 01/12/24  4098     History  Chief Complaint  Patient presents with   Seizures    [name] is a 68 y.o. female.   Seizures    Patient has a history of seizures diverticulitis pancreatitis alcohol dependence who presents to the ED for for altered mental status and seizures.  Patient was brought in by EMS.  They were called as patient was having seizures this morning.  EMS witnessed 2 seizures.  Patient was given assisted bag valve mask ventilation.  During transport patient's breathing became more regular.  She however has remained unresponsive.  Home Medications Prior to Admission medications   Medication Sig Start Date End Date Taking? Authorizing Provider  acetaminophen (TYLENOL) 500 MG tablet Take 500 mg by mouth every 6 (six) hours as needed for moderate pain (pain score 4-6).    [provider]  ciprofloxacin (CIPRO) 500 MG tablet Take 1 tablet (500 mg total) by mouth every 12 (twelve) hours. 10/22/23   Mannie Stabile, PA-C  ciprofloxacin (CIPRO) 500 MG tablet Take 1 tablet (500 mg total) by mouth 2 (two) times daily. 10/23/23   Jacalyn Lefevre, MD  cyanocobalamin 1000 MCG tablet Take 1 tablet (1,000 mcg total) by mouth daily. 09/28/23   Leroy Sea, MD  folic acid (FOLVITE) 1 MG tablet Take 1 tablet (1 mg total) by mouth daily. 09/28/23   Leroy Sea, MD  gabapentin (NEURONTIN) 300 MG capsule Take 1 capsule (300 mg total) by mouth 3 (three) times daily. 10/22/23   Mannie Stabile, PA-C  gabapentin (NEURONTIN) 300 MG capsule Take 1 capsule (300 mg total) by mouth 3 (three) times daily. 10/23/23   Jacalyn Lefevre, MD  lacosamide (VIMPAT) 50 MG TABS tablet Take 1 tablet (50 mg total) by mouth 2 (two) times daily. 10/22/23   Mannie Stabile, PA-C  levETIRAcetam (KEPPRA) 500 MG tablet Take 1 tablet (500 mg total) by mouth 2 (two) times  daily. 09/28/23   Leroy Sea, MD  menthol-cetylpyridinium (CEPACOL) 3 MG lozenge Take 1 lozenge (3 mg total) by mouth as needed for sore throat. 09/28/23   Leroy Sea, MD  methocarbamol (ROBAXIN) 500 MG tablet Take 1 tablet (500 mg total) by mouth 2 (two) times daily. 10/22/23   Mannie Stabile, PA-C  methocarbamol (ROBAXIN) 500 MG tablet Take 1 tablet (500 mg total) by mouth 2 (two) times daily. 10/23/23   Jacalyn Lefevre, MD  metroNIDAZOLE (FLAGYL) 500 MG tablet Take 1 tablet (500 mg total) by mouth 2 (two) times daily. 10/23/23   Jacalyn Lefevre, MD  pantoprazole (PROTONIX) 40 MG tablet Take 1 tablet (40 mg total) by mouth daily. 08/19/23 10/18/23  Maretta Bees, MD  thiamine (VITAMIN B1) 100 MG tablet Take 1 tablet (100 mg total) by mouth daily. 09/28/23   Leroy Sea, MD  DULoxetine (CYMBALTA) 30 MG capsule Take 1 capsule (30 mg total) by mouth daily. Patient not taking: Reported on 09/01/2020 06/11/20 09/02/20  Money, [name], FNP  metoCLOPramide (REGLAN) 10 MG tablet Take 1 tablet (10 mg total) by mouth every 6 (six) hours as needed for nausea (nausea/headache). 04/06/19 05/24/19  Mesner, Barbara Cower, MD      Allergies    Keflex [cephalexin], Cephalosporins, Prednisone, Toradol [ketorolac tromethamine], and Ultram [tramadol]    Review of Systems   Review of Systems  Neurological:  Positive for seizures.  Physical Exam Updated Vital Signs BP 96/62   Pulse (!) 102   Temp (!) 96.3 F (35.7 C) (Rectal)   Resp (!) 23   Ht 1.549 m (5\' 1" )   Wt 64 kg   SpO2 94%   BMI 26.66 kg/m  Physical Exam Vitals and nursing note reviewed.  Constitutional:      Appearance: She is well-developed. She is not diaphoretic.  HENT:     Head: Normocephalic and atraumatic.     Comments: Poor dentition    Right Ear: External ear normal.     Left Ear: External ear normal.     Mouth/Throat:     Comments: Superficial injury of tongue consistent with biting, no active bleeding Eyes:      General: No scleral icterus.       Right eye: No discharge.        Left eye: No discharge.     Conjunctiva/sclera: Conjunctivae normal.  Neck:     Trachea: No tracheal deviation.  Cardiovascular:     Rate and Rhythm: Normal rate and regular rhythm.  Pulmonary:     Effort: Pulmonary effort is normal. No respiratory distress.     Breath sounds: Normal breath sounds. No stridor. No wheezing or rales.  Abdominal:     General: Bowel sounds are normal. There is no distension.     Palpations: Abdomen is soft.     Tenderness: There is no abdominal tenderness. There is no guarding or rebound.  Musculoskeletal:        General: No swelling, tenderness or deformity.     Cervical back: Neck supple.  Skin:    General: Skin is warm and dry.     Findings: No rash.  Neurological:     General: No focal deficit present.     Mental Status: She is unresponsive.     Cranial Nerves: No facial asymmetry.     Motor: No seizure activity.     Comments: No seizure activity at this time  Psychiatric:        Mood and Affect: Mood normal.     ED Results / Procedures / Treatments   Labs (all labs ordered are listed, but only abnormal results are displayed) Labs Reviewed  COMPREHENSIVE METABOLIC PANEL - Abnormal; Notable for the following components:      Result Value   Potassium 2.8 (*)    Glucose, Bld 107 (*)    BUN 5 (*)    Creatinine, Ser 1.01 (*)    Anion gap 16 (*)    All other components within normal limits  CBC WITH DIFFERENTIAL/PLATELET - Abnormal; Notable for the following components:   MCV 106.8 (*)    MCH 34.5 (*)    Neutro Abs 8.4 (*)    Lymphs Abs 0.4 (*)    All other components within normal limits  CBG MONITORING, ED - Abnormal; Notable for the following components:   Glucose-Capillary 127 (*)    All other components within normal limits  I-STAT VENOUS BLOOD GAS, ED - Abnormal; Notable for the following components:   pH, Ven 7.247 (*)    Acid-base deficit 5.0 (*)     Potassium 2.7 (*)    Calcium, Ion 1.10 (*)    Hemoglobin 15.3 (*)    All other components within normal limits  ETHANOL  RAPID URINE DRUG SCREEN, HOSP PERFORMED  LEVETIRACETAM LEVEL  LACOSAMIDE    EKG EKG Interpretation Date/Time:  Wednesday January 12 2024 07:59:24 EDT Ventricular Rate:  123 PR  Interval:  178 QRS Duration:  85 QT Interval:  319 QTC Calculation: 457 R Axis:   -40  Text Interpretation: Sinus tachycardia Consider left atrial enlargement Left axis deviation Borderline low voltage, extremity leads Abnormal R-wave progression, late transition Confirmed by Linwood Dibbles [phone]) on 01/12/2024 10:03:33 AM  Radiology CT HEAD WO CONTRAST Result Date: 01/12/2024 CLINICAL DATA:  Mental status change, unknown cause. Seizure, new-onset, no history of trauma. EXAM: CT HEAD WITHOUT CONTRAST TECHNIQUE: Contiguous axial images were obtained from the base of the skull through the vertex without intravenous contrast. RADIATION DOSE REDUCTION: This exam was performed according to the departmental dose-optimization program which includes automated exposure control, adjustment of the mA and/or kV according to patient size and/or use of iterative reconstruction technique. COMPARISON:  Head CT 09/25/2023 and MRI 03/06/2022 FINDINGS: Brain: There is no evidence of an acute infarct, intracranial hemorrhage, mass, midline shift, or extra-axial fluid collection. Mild cerebral atrophy is again noted. Cerebral white matter hypodensities are similar to the prior CT and are nonspecific but compatible with mild chronic small vessel ischemic disease. There is an unchanged small focus of cortical encephalomalacia anteriorly in the left frontal lobe. Vascular: No hyperdense vessel. Skull: No acute fracture or suspicious lesion. Sinuses/Orbits: Minimal mucosal thickening in the included portions of the paranasal sinuses. Clear mastoid air cells. Left cataract extraction. Other: None. IMPRESSION: 1. No evidence of acute  intracranial abnormality. 2. Mild chronic small vessel ischemic disease. Electronically Signed   By: Sebastian Ache M.D.   On: 01/12/2024 09:17    Procedures .Critical Care  Performed by: Linwood Dibbles, MD Authorized by: Linwood Dibbles, MD   Critical care provider statement:    Critical care time (minutes):  30   Critical care was time spent personally by me on the following activities:  Development of treatment plan with patient or surrogate, discussions with consultants, evaluation of patient's response to treatment, examination of patient, ordering and review of laboratory studies, ordering and review of radiographic studies, ordering and performing treatments and interventions, pulse oximetry, re-evaluation of patient's condition and review of old charts Procedure Name: Intubation Date/Time: 01/12/2024 10:05 AM  Performed by: Linwood Dibbles, MDPre-anesthesia Checklist: Patient identified, Patient being monitored, Emergency Drugs available, Timeout performed and Suction available Oxygen Delivery Method: Non-rebreather mask Preoxygenation: Pre-oxygenation with 100% oxygen Induction Type: Rapid sequence Ventilation: Mask ventilation without difficulty Laryngoscope Size: Glidescope Number of attempts: 1 Airway Equipment and Method: Video-laryngoscopy Placement Confirmation: ETT inserted through vocal cords under direct vision, CO2 detector and Breath sounds checked- equal and bilateral Secured at: 21 cm Tube secured with: ETT holder Dental Injury: Teeth and Oropharynx as per pre-operative assessment         Medications Ordered in ED Medications  thiamine (VITAMIN B1) tablet 100 mg (100 mg Oral Not Given 01/12/24 0914)  potassium chloride 10 mEq in 100 mL IVPB (10 mEq Intravenous New Bag/Given 01/12/24 0945)  fentaNYL in NS (75mcg/ml) infusion-PREMIX (25 mcg/hr Intravenous New Bag/Given 01/12/24 1005)  fentaNYL (SUBLIMAZE) bolus via infusion 25-100 mcg (has no administration in time  range)  midazolam (VERSED) 100 mg/100 mL (1 mg/mL) premix infusion (has no administration in time range)  midazolam (VERSED) bolus via infusion 0-5 mg (has no administration in time range)  sodium chloride 0.9 % bolus 1,000 mL (0 mLs Intravenous Stopped 01/12/24 0944)  levETIRAcetam (KEPPRA) 2,000 mg in sodium chloride 0.9 % 250 mL IVPB (0 mg Intravenous Stopped 01/12/24 0818)  etomidate (AMIDATE) injection 20 mg (20 mg Intravenous Given 01/12/24  1610)  succinylcholine (ANECTINE) syringe 100 mg (100 mg Intravenous Given 01/12/24 0959)    ED Course/ Medical Decision Making/ A&P Clinical Course as of 01/12/24 1010  Wed Jan 12, 2024  0805 Patient appears unresponsive and postictal.  Will load her with Keppra as this is one of her home seizure medications.  Will proceed with laboratory testing and imaging.  Patient does appear to be protecting her airway at this time we will continue to monitor closely.  No indication for emergent intubation at this time [JK]  1002 Patient's oxygen saturations started decreasing despite supplemental oxygen.  Patient remains postictal.  Will proceed with intubation [JK]  1003 Head CT does not show any signs of acute abnormality [JK]    Clinical Course User Index [JK] Linwood Dibbles, MD                                 Medical Decision Making Amount and/or Complexity of Data Reviewed Labs: ordered. Radiology: ordered.  Risk OTC drugs. Prescription drug management. Decision regarding hospitalization.   Patient presented to the ER for evaluation of altered mental status seizures.  Patient has history of seizure disorder.  Patient reportedly had witnessed seizure this morning.  EMS also witnessed seizures on arrival.  Patient was given Versed IV with ultimate resolution of seizures.  In the ED the patient remained unresponsive.  Initially she was protecting her airway.  During her ED stay patients are developing increasing hypoxia despite supplemental oxygen.  Her  mental status had not improved.  Patient was intubated.   Will  proceed with cxr.  Aspiration, pna is a concern.  Suspect patient is having status epilepticus.  She will need emergent EEG.  I will consult with neurology and have added on a stat EEG.  Calcium supplementation ordered for her hypokalemia.  I have consulted with pulmonary critical care , Brooke, for admission.  Case discussed with Dr Amada Jupiter    Final Clinical Impression(s) / ED Diagnoses Final diagnoses:  Status epilepticus (HCC)  Hypokalemia  Hypoxia    Rx / DC Orders ED Discharge Orders     None            Linwood Dibbles, MD 01/12/24 1026

## 2024-01-12 NOTE — Progress Notes (Signed)
 STAT LTM EEG hooked up with CT compatible leads. [facility] not monitoring yet due to patient in ED.

## 2024-01-12 NOTE — Procedures (Signed)
 Bronchoscopy Procedure Note  [name]  [id number]  20-Oct-1955  Date:01/12/24  Time:12:47 PM   Provider Performing:Julya Alioto   Procedure(s):  Initial Therapeutic Aspiration of Tracheobronchial Tree [phone])  Indication(s) Right lower lobe collapse  Consent Unable to obtain consent due to emergent nature of procedure.  Anesthesia Patient already intubated and on Versed and fentanyl.   Time Out Verified patient identification, verified procedure, site/side was marked, verified correct patient position, special equipment/implants available, medications/allergies/relevant history reviewed, required imaging and test results available.   Sterile Technique Usual hand hygiene, masks, gowns, and gloves were used   Procedure Description Bronchoscope advanced through endotracheal tube and into airway.  Airways were examined down to subsegmental level with findings noted below.   Following diagnostic evaluation, Therapeutic aspiration performed in superior segment of right lower lobe  Findings: Clear whitish secretions predominantly in the right segments and obstructing the opening to the superior segment.  Easily aspirated without need for lavage. No endobronchial lesions noted.  Complications/Tolerance None; patient tolerated the procedure well. Chest X-ray is not needed post procedure.   EBL None   Specimen(s) None  Lynnell Catalan, MD [facility] ICU Physician [facility]  Pager: [phone] Or Epic Secure Chat After hours: [phone].  01/12/2024, 12:48 PM

## 2024-01-13 ENCOUNTER — Encounter (HOSPITAL_COMMUNITY)

## 2024-01-13 ENCOUNTER — Inpatient Hospital Stay (HOSPITAL_COMMUNITY)

## 2024-01-13 DIAGNOSIS — Z91148 Patient's other noncompliance with medication regimen for other reason: Secondary | ICD-10-CM | POA: Diagnosis not present

## 2024-01-13 DIAGNOSIS — G40901 Epilepsy, unspecified, not intractable, with status epilepticus: Secondary | ICD-10-CM | POA: Diagnosis not present

## 2024-01-13 DIAGNOSIS — I952 Hypotension due to drugs: Secondary | ICD-10-CM | POA: Diagnosis not present

## 2024-01-13 DIAGNOSIS — J9601 Acute respiratory failure with hypoxia: Secondary | ICD-10-CM | POA: Diagnosis not present

## 2024-01-13 DIAGNOSIS — R569 Unspecified convulsions: Secondary | ICD-10-CM

## 2024-01-13 DIAGNOSIS — J9811 Atelectasis: Secondary | ICD-10-CM

## 2024-01-13 LAB — CBC
HCT: 40.2 % (ref 36.0–46.0)
Hemoglobin: 13.1 g/dL (ref 12.0–15.0)
MCH: 33.7 pg (ref 26.0–34.0)
MCHC: 32.6 g/dL (ref 30.0–36.0)
MCV: 103.3 fL — ABNORMAL HIGH (ref 80.0–100.0)
Platelets: 233 10*3/uL (ref 150–400)
RBC: 3.89 MIL/uL (ref 3.87–5.11)
RDW: 15.4 % (ref 11.5–15.5)
WBC: 7.7 10*3/uL (ref 4.0–10.5)
nRBC: 0 % (ref 0.0–0.2)

## 2024-01-13 LAB — BASIC METABOLIC PANEL WITH GFR
Anion gap: 14 (ref 5–15)
BUN: 6 mg/dL — ABNORMAL LOW (ref 8–23)
CO2: 18 mmol/L — ABNORMAL LOW (ref 22–32)
Calcium: 8 mg/dL — ABNORMAL LOW (ref 8.9–10.3)
Chloride: 108 mmol/L (ref 98–111)
Creatinine, Ser: 0.59 mg/dL (ref 0.44–1.00)
GFR, Estimated: >60 mL/min (ref >60)
Glucose, Bld: 85 mg/dL (ref 70–99)
Potassium: 3 mmol/L — ABNORMAL LOW (ref 3.5–5.1)
Sodium: 140 mmol/L (ref 135–145)

## 2024-01-13 LAB — LEVETIRACETAM LEVEL: Levetiracetam Lvl: <2 ug/mL — ABNORMAL LOW (ref 10.0–40.0)

## 2024-01-13 LAB — GLUCOSE, CAPILLARY
Glucose-Capillary: 106 mg/dL — ABNORMAL HIGH (ref 70–99)
Glucose-Capillary: 129 mg/dL — ABNORMAL HIGH (ref 70–99)
Glucose-Capillary: 67 mg/dL — ABNORMAL LOW (ref 70–99)
Glucose-Capillary: 76 mg/dL (ref 70–99)
Glucose-Capillary: 84 mg/dL (ref 70–99)
Glucose-Capillary: 84 mg/dL (ref 70–99)
Glucose-Capillary: 92 mg/dL (ref 70–99)
Glucose-Capillary: 93 mg/dL (ref 70–99)

## 2024-01-13 LAB — PHOSPHORUS: Phosphorus: 2.3 mg/dL — ABNORMAL LOW (ref 2.5–4.6)

## 2024-01-13 MED ORDER — POTASSIUM CHLORIDE 20 MEQ PO PACK
40.0000 meq | PACK | ORAL | Status: AC
  Administered 2024-01-13 (×2): 40 meq
  Filled 2024-01-13 (×2): qty 2

## 2024-01-13 MED ORDER — FENTANYL CITRATE PF 50 MCG/ML IJ SOSY
25.0000 ug | PREFILLED_SYRINGE | INTRAMUSCULAR | Status: DC | PRN
  Administered 2024-01-13 – 2024-01-14 (×2): 50 ug via INTRAVENOUS
  Filled 2024-01-13: qty 1

## 2024-01-13 MED ORDER — NOREPINEPHRINE 4 MG/250ML-% IV SOLN
2.0000 ug/min | INTRAVENOUS | Status: DC
  Administered 2024-01-13: 2 ug/min via INTRAVENOUS
  Filled 2024-01-13: qty 250

## 2024-01-13 MED ORDER — SODIUM CHLORIDE 0.9 % IV BOLUS
500.0000 mL | Freq: Once | INTRAVENOUS | Status: AC
  Administered 2024-01-13: 500 mL via INTRAVENOUS

## 2024-01-13 MED ORDER — SODIUM CHLORIDE 0.9 % IV SOLN: 250.0000 mL | INTRAVENOUS | Status: AC

## 2024-01-13 MED ORDER — MAGNESIUM SULFATE 4 GM/100ML IV SOLN
4.0000 g | Freq: Once | INTRAVENOUS | Status: AC
  Administered 2024-01-13: 4 g via INTRAVENOUS
  Filled 2024-01-13: qty 100

## 2024-01-13 MED ORDER — DEXMEDETOMIDINE HCL IN NACL 400 MCG/100ML IV SOLN
0.0000 ug/kg/h | INTRAVENOUS | Status: DC
  Administered 2024-01-13 (×3): 0.4 ug/kg/h via INTRAVENOUS
  Administered 2024-01-14: 0.8 ug/kg/h via INTRAVENOUS
  Filled 2024-01-13 (×4): qty 100

## 2024-01-13 MED ORDER — FENTANYL CITRATE PF 50 MCG/ML IJ SOSY
25.0000 ug | PREFILLED_SYRINGE | INTRAMUSCULAR | Status: AC | PRN
  Administered 2024-01-13 (×3): 25 ug via INTRAVENOUS

## 2024-01-13 MED ORDER — DEXTROSE 50 % IV SOLN
12.5000 g | INTRAVENOUS | Status: AC
  Administered 2024-01-13: 12.5 g via INTRAVENOUS
  Filled 2024-01-13: qty 50

## 2024-01-13 NOTE — Progress Notes (Signed)
 EEG maint complete.\ No skin breakdown at [id number]

## 2024-01-13 NOTE — Progress Notes (Signed)
 OT Cancellation Note  Patient Details Name: [name] MRN: [id number] DOB: October 31, 1955   Cancelled Treatment:    Reason Eval/Treat Not Completed: Patient not medically ready.  Advised by PT patient not appropriate at this time.  OT will continue efforts as appropriate.    Manly Nestle D Windell Musson 01/13/2024, 2:24 PM 01/13/2024  RP, OTR/L  Acute Rehabilitation Services  Office:  [phone]

## 2024-01-13 NOTE — Progress Notes (Signed)
 NEUROLOGY CONSULT FOLLOW UP NOTE   Date of service: January 13, 2024 Patient Name: [name] MRN:  [id number] DOB:  Dec 18, 1955  Interval Hx/subjective   Intubated 3/26 in ED. Still on sedation with fentanyl and versed. Versed decreased to 1mg /hr while at bedside. Needed increase in sedation overnight due to agitation.  Home AEDs resumed inpatient: Gabapentin 300mg  TID Vimpat 50mg  BID Keppra 500mg  BID    Vitals   Vitals:   01/13/24 0400 01/13/24 0500 01/13/24 0600 01/13/24 0700  BP: 101/60 (!) 93/58 104/72 (!) 87/55  Pulse: 66 62 89 69  Resp: 18 18 18 18   Temp: 98.4 F (36.9 C) 98.4 F (36.9 C) 98.2 F (36.8 C) 98.2 F (36.8 C)  TempSrc:    Bladder  SpO2: 100% 100% 100% 99%  Weight: 68.5 kg     Height:         Body mass index is 28.53 kg/m.  Physical Exam   Constitutional: Ill appearing Psych: Sedated Eyes: No scleral injection.  HENT: No OP obstrucion.  Head: Normocephalic.  Cardiovascular: Normal rate and regular rhythm.  Respiratory: Mechanically ventilated  GI: Soft.  No distension. There is no tenderness.  Skin: WDI.   Neurologic Examination   Neuro: Fentanyl , Versed 2mg  Mental Status: Patient intubated, eyes closed on examiner arrival to room.  Opens eyes to noxious stimuli but does not follow commands.  Cranial Nerves: II: PERRL   III,IV, VI: Gaze is midline  V: Facial sensation is symmetric to temperature VII: Facial appears symmetric  VIII: No response to verbal stimuli  X: Cough and gag intact XI: Head is midline  Motor: Tone is increased. Bulk is normal.  Withdraws in bilateral upper and lower extremities  Sensory: As above Cerebellar: Unable to test  On attending exam after versed weaned  Opening eyes to voice. Not following commands clearly but tracks examiner w/ nystagmus noted during eye movements. Mild right gaze preference. Spontaneous cough. More movement on the right than the left spontaneously and in response  to noxious stim but moving all four extremities.   Medications  Current Facility-Administered Medications:    acetaminophen (TYLENOL) tablet 650 mg, 650 mg, Per Tube, Q4H PRN, Agarwala, Ravi, MD   albuterol (PROVENTIL) (2.5 MG/3ML) 0.083% nebulizer solution 2.5 mg, 2.5 mg, Nebulization, Q4H PRN, Selmer Dominion B, NP   Ampicillin-Sulbactam (UNASYN) 3 g in sodium chloride 0.9 % 100 mL IVPB, 3 g, Intravenous, Q6H, Wilmer Floor, RPH, Stopped at 01/13/24 0516   Chlorhexidine Gluconate Cloth 2 % PADS 6 each, 6 each, Topical, Daily, Agarwala, Ravi, MD, 6 each at 01/12/24 1554   enoxaparin (LOVENOX) injection 40 mg, 40 mg, Subcutaneous, Q24H, Agarwala, Ravi, MD, 40 mg at 01/12/24 1317   famotidine (PEPCID) tablet 20 mg, 20 mg, Per Tube, BID, Agarwala, Ravi, MD, 20 mg at 01/12/24 2233   fentaNYL (SUBLIMAZE) bolus via infusion 25-100 mcg, 25-100 mcg, Intravenous, Q15 min PRN, Linwood Dibbles, MD, 100 mcg at 01/13/24 8295   fentaNYL in NS (64mcg/ml) infusion-PREMIX, 25-200 mcg/hr, Intravenous, Continuous, Linwood Dibbles, MD, Last Rate: 2.5 mL/hr at 01/13/24 0600, 25 mcg/hr at 01/13/24 0600   folic acid (FOLVITE) tablet 1 mg, 1 mg, Per Tube, Daily, Selmer Dominion B, NP, 1 mg at 01/12/24 1253   gabapentin (NEURONTIN) capsule 300 mg, 300 mg, Per Tube, TID, Rejeana Brock, MD, 300 mg at 01/12/24 2233   insulin aspart (novoLOG) injection 0-9 Units, 0-9 Units, Subcutaneous, Q4H, Selmer Dominion B, NP   lacosamide (VIMPAT) 50  mg in sodium chloride 0.9 % 25 mL IVPB, 50 mg, Intravenous, Q12H, Rejeana Brock, MD, Stopped at 01/12/24 2321   lactated ringers infusion, , Intravenous, Continuous, Selmer Dominion B, NP, Last Rate: 65 mL/hr at 01/13/24 0600, Infusion Verify at 01/13/24 0600   levETIRAcetam (KEPPRA) IVPB 500 mg/100 mL premix, 500 mg, Intravenous, Q12H, Rejeana Brock, MD, Stopped at 01/12/24 2247   midazolam (VERSED) 100 mg/100 mL (1 mg/mL) premix infusion, 0-10 mg/hr,  Intravenous, Continuous, Linwood Dibbles, MD, Last Rate: 2 mL/hr at 01/13/24 0600, 2 mg/hr at 01/13/24 0600   midazolam (VERSED) bolus via infusion 0-5 mg, 0-5 mg, Intravenous, Q1H PRN, Linwood Dibbles, MD, 2 mg at 01/13/24 0636   ondansetron (ZOFRAN) injection 4 mg, 4 mg, Intravenous, Q6H PRN, Lynnell Catalan, MD   Oral care mouth rinse, 15 mL, Mouth Rinse, Q2H, Simpson, Paula B, NP, 15 mL at 01/13/24 0551   Oral care mouth rinse, 15 mL, Mouth Rinse, PRN, Selmer Dominion B, NP   polyethylene glycol (MIRALAX / GLYCOLAX) packet 17 g, 17 g, Per Tube, Daily, Selmer Dominion B, NP, 17 g at 01/12/24 1259   thiamine (VITAMIN B1) tablet 100 mg, 100 mg, Per Tube, Daily, Selmer Dominion B, NP, 100 mg at 01/12/24 1252  Labs and Diagnostic Imaging   CBC:  Recent Labs  Lab 01/12/24 0759 01/12/24 0814 01/12/24 1050 01/13/24 0654  WBC 9.4  --   --  7.7  NEUTROABS 8.4*  --   --   --   HGB 14.2   < > 12.9 13.1  HCT 44.0   < > 38.0 40.2  MCV 106.8*  --   --  103.3*  PLT 274  --   --  233   < > = values in this interval not displayed.    Basic Metabolic Panel:  Lab Results  Component Value Date   NA 140 01/12/2024   K 3.2 (L) 01/12/2024   CO2 23 01/12/2024   GLUCOSE 107 (H) 01/12/2024   BUN 5 (L) 01/12/2024   CREATININE 0.61 01/12/2024   CALCIUM 9.1 01/12/2024   GFRNONAA >60 01/12/2024   GFRAA >60 06/09/2020   Lipid Panel:  Lab Results  Component Value Date   LDLCALC 58 06/09/2020   HgbA1c:  Lab Results  Component Value Date   HGBA1C 5.0 05/17/2022   Urine Drug Screen:     Component Value Date/Time   LABOPIA NONE DETECTED 01/12/2024 1107   COCAINSCRNUR NONE DETECTED 01/12/2024 1107   LABBENZ POSITIVE (A) 01/12/2024 1107   AMPHETMU NONE DETECTED 01/12/2024 1107   THCU NONE DETECTED 01/12/2024 1107   LABBARB NONE DETECTED 01/12/2024 1107    Alcohol Level     Component Value Date/Time   ETH <10 01/12/2024 0759   INR  Lab Results  Component Value Date   INR 1.0 05/17/2022   APTT   Lab Results  Component Value Date   APTT 22 (L) 05/17/2022   AED levels:  Lab Results  Component Value Date   PHENYTOIN <2.5 (L) 10/17/2022   LEVETIRACETA <2.0 (L) 09/25/2023    CT Head without contrast(Personally reviewed): 1. No evidence of acute intracranial abnormality. 2. Mild chronic small vessel ischemic disease.  LTM EEG: 3/26 to 3/27 AM - Continuous slow, generalized and maximal right posterior temporal    Assessment  Hanan Ermel Verne is a 68 y.o. female with a history of seizures with likely medication noncompliance who is presenting with multiple seizures without return to baseline (status epilepticus).  She  has been intubated and will need continuous EEG monitoring to ensure that she has stopped seizing and is not in nonconvulsive status epilepticus. LTM is running, report pending.   Exam improving off versed as documented above    Home AEDs resumed inpatient: Gabapentin 300mg  TID Vimpat 50mg  BID Keppra 500mg  BID  Recommendations  - Consider precedex for sedation for if unable to extubate rather than versed - Appreciate CCM plan to try to extubate - D/C Overnight EEG if able to stay off versed / extubate.  - Continue Keppra 500 mg twice daily (home dose) - Gabapentin 300 mg 3 times daily (home dose) - Lacosamide 50 mg twice daily (home dose) - Neurology will follow if unable to extubate or needs to stay on versed for now, otherwise will plan to sign off later today. Please reach out if patient has more seizures or other concerns arise - Appreciate management of comorbitidies by CCM ______________________________________________________________________   Signed, Elmer Picker, NP [facility]  Attending Neurologist's note:  I personally saw this patient, gathering history, performing a full neurologic examination, reviewing relevant labs, personally reviewing relevant imaging, and formulated the assessment and plan, adding the note above for  completeness and clarity to accurately reflect my thoughts  Brooke Dare MD-PhD [facility] [phone]  CRITICAL CARE Performed by: Gordy Councilman   Total critical care time: 35 minutes  Critical care time was exclusive of separately billable procedures and treating other patients.  Critical care was necessary to treat or prevent imminent or life-threatening deterioration.  Critical care was time spent personally by me on the following activities: development of treatment plan with patient and/or surrogate as well as nursing, discussions with consultants, evaluation of patient's response to treatment, examination of patient, obtaining history from patient or surrogate, ordering and performing treatments and interventions, ordering and review of laboratory studies, ordering and review of radiographic studies, pulse oximetry and re-evaluation of patient's condition.

## 2024-01-13 NOTE — TOC Initial Note (Signed)
 Transition of Care [facility]) - Initial/Assessment Note    Patient Details  Name: [name] MRN: [id number] Date of Birth: 1956-04-08  Transition of Care [facility]) CM/SW Contact:    Lamonte Sakai, Student-Social Work Phone Number: 01/13/2024, 10:14 AM  Clinical Narrative:                 Pt admitted from [facility] with seizures. Pt currently intubated, please consult as TOC needs arise.        Patient Goals and CMS Choice            Expected Discharge Plan and Services       Living arrangements for the past 2 months: Hotel/Motel                                      Prior Living Arrangements/Services Living arrangements for the past 2 months: Hotel/Motel                     Activities of Daily Living   ADL Screening (condition at time of admission) Independently performs ADLs?: No Does the patient have a NEW difficulty with bathing/dressing/toileting/self-feeding that is expected to last >3 days?: Yes (Initiates electronic notice to provider for possible OT consult) Does the patient have a NEW difficulty with getting in/out of bed, walking, or climbing stairs that is expected to last >3 days?: Yes (Initiates electronic notice to provider for possible PT consult) Does the patient have a NEW difficulty with communication that is expected to last >3 days?: Yes (Initiates electronic notice to provider for possible SLP consult)  Permission Sought/Granted                  Emotional Assessment   Attitude/Demeanor/Rapport: Intubated (Following Commands or Not Following Commands) Affect (typically observed): Unable to Assess (Intubated)   Alcohol / Substance Use: Tobacco Use, Alcohol Use    Admission diagnosis:  Hypokalemia [E87.6] Status epilepticus (HCC) [G40.901] Hypoxia [R09.02] Patient Active Problem List   Diagnosis Date Noted   Recurrent seizures (HCC) 08/18/2023   History of seizure 05/05/2023   GAD (generalized anxiety  disorder) 05/05/2023   Seizure-like activity (HCC) 12/18/2022   Seizure (HCC) 12/18/2022   Hyperkalemia 10/19/2022   Acute metabolic encephalopathy 10/18/2022   Seizures (HCC) 10/17/2022   Chronic pain    Hypoalbuminemia    Hyperglycemia    Nonadherence to medication    Seizure (HCC) 05/17/2022   Status epilepticus (HCC)    Hypokalemia    Alcohol-induced mood disorder (HCC)    Closed displaced oblique fracture of shaft of left humerus 08/01/2020   GERD (gastroesophageal reflux disease) 02/28/2020   Alcohol withdrawal related seizure (HCC) 12/13/2018   Thrombocytopenia (HCC) 12/13/2018   Major depressive disorder 12/13/2018   Neuropathy 12/13/2018   Subarachnoid hemorrhage (HCC) 12/11/2018   Seizure due to alcohol withdrawal (HCC) 12/11/2018   Alcohol dependence with alcohol-induced mood disorder (HCC)    Major depressive disorder, 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 [place] [address] Phone: [phone] Fax: [phone]  [facility] [address], [place] - [address] [place] [address] Phone: [phone] Fax: [phone]  [facility] -  [place]), [place] - [address] [place] ([place]) [place] [address] Phone: [phone] Fax: [phone]     Social Drivers of Health (SDOH) Social History: SDOH Screenings   Food Insecurity: Patient Unable To Answer (01/12/2024)  Housing: Patient Unable To Answer (01/12/2024)  Transportation Needs: Patient Unable To Answer (01/12/2024)  Utilities: Patient Unable To Answer (01/12/2024)  Alcohol Screen: Medium Risk (12/18/2018)  Depression (PHQ2-9): Medium Risk (05/10/2021)  Financial Resource Strain: Medium Risk (09/27/2018)  Physical Activity: Unknown (09/27/2018)   Social Connections: Patient Unable To Answer (01/12/2024)  Stress: No Stress Concern Present (09/27/2018)  Tobacco Use: High Risk (01/12/2024)   SDOH Interventions:     Readmission Risk Interventions     No data to display

## 2024-01-13 NOTE — Progress Notes (Signed)
 PT Cancellation Note  Patient Details Name: [name] MRN: [id number] DOB: 06-05-1956   Cancelled Treatment:    Reason Eval/Treat Not Completed: (P) Patient not medically ready. RN reporting pt is still intubated and sedated and when pt does arouse she is agitated, fighting the vent. RN reporting pt may be extubated tomorrow. Will plan to hold off today and follow-up another day as able.   Virgil Benedict, PT, DPT Acute Rehabilitation Services  Office: [phone]    [name] 01/13/2024, 1:59 PM

## 2024-01-13 NOTE — Progress Notes (Signed)
 SLP Cancellation Note  Patient Details Name: [name] MRN: [id number] DOB: Apr 29, 1956   Cancelled treatment:       Reason Eval/Treat Not Completed: Medical issues which prohibited therapy (Patient intubated. Will f/u next date.)   Ferdinand Lango MA, CCC-SLP   Twylia Oka Meryl 01/13/2024, 8:37 AM

## 2024-01-13 NOTE — Plan of Care (Signed)
   Problem: Education: Goal: Knowledge of General Education information will improve Description: Including pain rating scale, medication(s)/side effects and non-pharmacologic comfort measures Outcome: Not Progressing   Problem: Health Behavior/Discharge Planning: Goal: Ability to manage health-related needs will improve Outcome: Not Progressing

## 2024-01-13 NOTE — Procedures (Signed)
 Patient Name: [name]  MRN: [id number]  Epilepsy Attending: Charlsie Quest  Referring Physician/Provider: Rejeana Brock, MD  Duration: 01/12/2024 1212 to 01/13/2024 1212  Patient history:  68 y.o. female with a history of seizures with likely medication noncompliance who is presenting with multiple seizures without return to baseline (status epilepticus). EEG to evaluate for seizure  Level of alertness: lethargic   AEDs during EEG study: LEV, LCM, GBP, versed  Technical aspects: This EEG study was done with scalp electrodes positioned according to the 10-20 International system of electrode placement. Electrical activity was reviewed with band pass filter of 1-70Hz , sensitivity of 7 uV/mm, display speed of 58mm/sec with a 60Hz  notched filter applied as appropriate. EEG data were recorded continuously and digitally stored.  Video monitoring was available and reviewed as appropriate.  Description: EEG showed continuous generalized and maximal right posterior temporal 3 to 6 Hz theta-delta slowing admixed with 15 to 18 Hz beta activity distributed symmetrically and diffusely. Hyperventilation and photic stimulation were not performed.    ABNORMALITY - Continuous slow, generalized and maximal right posterior temporal   IMPRESSION: This study is suggestive of cortical dysfunction arising from right posterior temporal region likely secondary to underlying structural abnormality. Additionally there is moderate diffuse encephalopathy. No seizures or definite epileptiform discharges were seen throughout the recording.   Please note lack of epileptiform activity during interictal eeg doesn't exclude diagnosis of epilepsy.    Kaelei Wheeler Annabelle Harman

## 2024-01-13 NOTE — Progress Notes (Signed)
 LTM maint complete - no skin breakdown seen. [facility] monitored, Event button test confirmed by [facility].

## 2024-01-13 NOTE — Progress Notes (Signed)
 Afternoon rounds  She's been weaned off the fent gtt and versed Triggered apnea alarm w/ weaning attempt but when stimulated she is agitated and trying to sit up in bed.  Last EEG report no current seizure   Plan Cont dex  Dc both fent and versed gtt orders  PRN only  Will hold off from adding additional rx but may need to consider adding something for delirium tomorrow

## 2024-01-13 NOTE — Progress Notes (Addendum)
 Initial Nutrition Assessment  DOCUMENTATION CODES:  Not applicable  INTERVENTION:   If pt is not extubated and needs enteral nutrition: Initiate tube feeding via OG tube: recommend beginning at 41ml/h and increasing by 10ml every 12 hours until goal rate reached Osmolite 1.5 at 40 ml/h (960 ml per day)  Prosource TF20 60 ml 1x/day  Provides 1520 kcal, 80 gm protein, 731 ml free water daily  Pt at high refeed risk; recommend monitor potassium, magnesium and phosphorus labs daily until stable  Continue daily thiamine 100mg  and folic acid supplementation  Recommend repeat NFPE at follow-up  NUTRITION DIAGNOSIS:  Inadequate oral intake related to inability to eat as evidenced by NPO status.  GOAL:  Patient will meet greater than or equal to 90% of their needs  MONITOR:  Vent status, Labs, Diet advancement  REASON FOR ASSESSMENT:  Ventilator    ASSESSMENT:  Pt with hx of seizures (non compliant with medications), anxiety, chronic pain, diverticulitis, pancreatitis, and EtOH abuse. Admitted by EMS for altered mentation following 2 witnessed seizures. Pt was sedated on versed and fentanyl.  3/26 admitted to ICU, intubated and sedated 3/27 sedation weaning with plan to extubate 3/28  Pt discussed during ICU rounds and with RN and MD.  Neurology following and conducted EEG to monitor seizure activity. Supplementing thiamine and folic acid for possible EtOH withdrawal. Weaning sedation, versed d/c with low dose of levo and precedex. Plan to extubate tomorrow 2/28. Pt administered dextrose 50% to correct hypoglycemia. Will continue to monitor glucose levels. Pt currently NPO, will determine plan to feed after extubation 3/28. Pt receiving electrolyte repletion for hypokalemia, hypophosphatemia, and hypomagnesia. Will continue to monitor electrolyte labs once feeding is initiated.  Pt sedated and intubated at time of assessment with no family members present. Unable to obtain diet or  weight hx at this time. Conducted nutrition focused physical exam which showed mild depletions of muscle in clavicle area. Otherwise, unable to assess certain areas due to pt's position in bed and EEG monitor electrodes. Recommend repeat NFPE after pt is extubated and weaned from sedation.   Patient is currently intubated on ventilator support MV: 6.6 L/min Temp (24hrs), Avg:98 F (36.7 C), Min:97 F (36.1 C), Max:98.6 F (37 C)  Admit weight: 68.5kg  Current weight: 68.5kg   Intake/Output Summary (Last 24 hours) at 01/13/2024 1640 Last data filed at 01/13/2024 1530 Gross per 24 hour  Intake 2942.69 ml  Output 1175 ml  Net 1767.69 ml   Net IO Since Admission: 2,252.62 mL [01/13/24 1640]  Drains/Lines: OG tube: output Endotracheal tube Urethral Catheter: output  Nutritionally Relevant Medications: Scheduled Meds: Pepcid Folic acid Miralax Thiamine Dextrose 50% 1x Potassium chloride 2x  Continuous Infusions: Unasyn  Precedex 0.39mcg/kg/hr Vimpat Keppra Versed stopped  Levo 33mcg/min Magnesium sulfate 4g  PRN Meds: fentaNYL, midazolam, ondansetron (ZOFRAN) IV  Labs Reviewed: Potassium 3.0 Phosphorus 2.3 Magnesium 1.4 (3/26) CBG ranges from 59-129 mg/dL over the last 24 hours  NUTRITION - FOCUSED PHYSICAL EXAM:  Flowsheet Row Most Recent Value  Orbital Region No depletion  Upper Arm Region No depletion  Thoracic and Lumbar Region No depletion  Buccal Region Unable to assess  Temple Region Unable to assess  [EEG]  Clavicle Bone Region Mild depletion  Clavicle and Acromion Bone Region Mild depletion  Scapular Bone Region Unable to assess  [pt not in good position to assess]  Dorsal Hand Unable to assess  [mittens]  Patellar Region No depletion  Anterior Thigh Region No  depletion  Posterior Calf Region No depletion  Edema (RD Assessment) None  Hair Reviewed  Eyes Unable to assess  Mouth Unable to assess  Skin Reviewed  Nails Unable  to assess   Diet Order:   Diet Order             Diet NPO time specified  Diet effective now                  EDUCATION NEEDS:   Not appropriate for education at this time  Skin:  Skin Assessment: Reviewed RN Assessment  Last BM:  PTA  Height:  Ht Readings from Last 1 Encounters:  01/12/24 5\' 1"  (1.549 m)   Weight:  Wt Readings from Last 1 Encounters:  01/13/24 68.5 kg   Ideal Body Weight:  47.7 kg  BMI:  Body mass index is 28.53 kg/m.  Estimated Nutritional Needs:   Kcal:  1500-1700  Protein:  80-95g  Fluid:  1.4-1.6L  Louis Meckel Dietetic Intern

## 2024-01-13 NOTE — Progress Notes (Addendum)
 NAME:  [name], MRN:  [id number], DOB:  16-May-1956, LOS: 1 ADMISSION DATE:  01/12/2024, CONSULTATION DATE:  01/12/24 REFERRING MD:  Dr. Lynelle Doctor, CHIEF COMPLAINT:  AMS   History of Present Illness:  Pt encephalopathic, HPI obtained from EMR  80 yoF w/ PMH significant for seizures with med non-compliance, ETOH abuse, anxiety/ depression presented by EMS for AMS.  Witnessed seizure x 2 with EMS s/p versed.  On arrival to ER, no further seizure activity but required intubation for airway protection and O2 desaturations.  CTH neg.  Loaded with keppra and vimat started.  Sedated on fentanyl/ versed gtt.  Neurology consulted.  Labs show AKI, ETOH neg, K 2.7.  PCCM to admit.   Pertinent  Medical History   Past Medical History:  Diagnosis Date   Alcohol dependence (HCC)    Anxiety    Chronic pain    Depression    Diverticulitis    Herniated cervical disc    Pancreatitis    Seizures (HCC)    alcoholic seizures   Significant Hospital Events: Including procedures, antibiotic start and stop dates in addition to other pertinent events   3/26 admit, intubated. Bronchoscopy for RLL collapse  Interim History / Subjective:  On LTM Minimal vent settings Overnight had low glucose Objective   Blood pressure (!) 87/55, pulse 69, temperature 98.2 F (36.8 C), temperature source Bladder, resp. rate 18, height 5\' 1"  (1.549 m), weight 68.5 kg, SpO2 99%.    Vent Mode: PRVC FiO2 (%):  [50 %-100 %] 50 % Set Rate:  [18 bmp] 18 bmp Vt Set:  [380 mL] 380 mL PEEP:  [8 cmH20] 8 cmH20 Plateau Pressure:  [18 cmH20-24 cmH20] 18 cmH20   Intake/Output Summary (Last 24 hours) at 01/13/2024 0801 Last data filed at 01/13/2024 0600 Gross per 24 hour  Intake 2460.29 ml  Output 795 ml  Net 1665.29 ml   Filed Weights   01/12/24 0756 01/13/24 0400  Weight: 64 kg 68.5 kg   Physical Exam: General: Critically ill-appearing, sedated HENT: [place], AT, ETT in place Eyes: EOMI, no scleral  icterus Respiratory: Clear to auscultation bilaterally.  No crackles, wheezing or rales Cardiovascular: RRR, -M/R/G, no JVD GI: BS+, soft, nontender Extremities:-Edema,-tenderness Neuro: Sedated, PERRL GU: Foley in place  CXR with some improvement in RLL atelectasis K 3.0 Mg 1.4 CO2 18 AKI resolved  Resolved Hospital Problem list    Assessment & Plan:   Acute encephalopathy, presumed status epilepticus Hx seizures with med non compliance  Hx ETOH abuse, unclear if current/ prior use, neg on admit P:  - Neurology following - AEDs per Neurology> home doses of keppra, vimpat gabapentin  - Wean off Versed  - cEEG - neuro watch/ seizure precautions - cont supportive care - check ammonia/ TSH/ UDS for completeness  - no fever/ leukocytosis, treating empirically for aspiration as below.  Monitor clinically - empiric thiamine/ folate, monitor for possible withdrawal  Acute hypoxic respiratory insufficiency related to above Presumed aspiration RLL PNA/ pneumonitis +/- atelectasis with RLL collapse S/p bronchoscopy 3/26 P:  - Full vent support - LTVV, 4-8cc/kg IBW with goal Pplat<30 and DP<15 - VAP prevention protocol/ PPI - PAD protocol for sedation> fentanyl/ versed gtt> titrate pending LTM, bowel regimen - prn CXR, prn ABG - wean PEEP/ FiO2 as able for SpO2 >92% - SBT/WUA when able. Wean off sedation for RASS -1 - aggressive pulmonary hygiene - prn albuterol - empiric Unasyn coverage  Sedation related hypotension - On low dose levophed -  Wean for MAP >65  Hypokalemia AKI - resolved - s/p fluid bolus in ER.  Complete MIVF LR  - Replete K and Mag - cont foley - trend renal indices  - strict I/Os, daily wts - avoid nephrotoxins, renal dose meds, hemodynamic support as above  Hypoglycemia - overnight - If unable to extubate will need to start TF   Best Practice (right click and "Reselect all SmartList Selections" daily)   Diet/type: NPO DVT prophylaxis  LMWH Pressure ulcer(s): N/A GI prophylaxis: H2B Lines: N/A Foley:  Yes, and it is still needed Code Status:  full code Last date of multidisciplinary goals of care discussion [pending]  NOK> daughter's number invalid, SO, [name] [phone] no answer/ VM, other number not in service  Critical care time: 36 mins   The patient is critically ill with multiple organ systems failure and requires high complexity decision making for assessment and support, frequent evaluation and titration of therapies, application of advanced monitoring technologies and extensive interpretation of multiple databases.  Independent Critical Care Time: 36 Minutes.   Mechele Collin, M.D. [facility] Pulmonary/Critical Care Medicine 01/13/2024 8:01 AM   Please see Amion for pager number to reach on-call Pulmonary and Critical Care Team.

## 2024-01-14 ENCOUNTER — Inpatient Hospital Stay (HOSPITAL_COMMUNITY)

## 2024-01-14 DIAGNOSIS — E16A1 Hypoglycemia level 1: Secondary | ICD-10-CM | POA: Diagnosis not present

## 2024-01-14 DIAGNOSIS — R569 Unspecified convulsions: Secondary | ICD-10-CM | POA: Diagnosis not present

## 2024-01-14 DIAGNOSIS — J9601 Acute respiratory failure with hypoxia: Secondary | ICD-10-CM | POA: Diagnosis not present

## 2024-01-14 DIAGNOSIS — Z91148 Patient's other noncompliance with medication regimen for other reason: Secondary | ICD-10-CM | POA: Diagnosis not present

## 2024-01-14 DIAGNOSIS — G40901 Epilepsy, unspecified, not intractable, with status epilepticus: Secondary | ICD-10-CM | POA: Diagnosis not present

## 2024-01-14 LAB — GLUCOSE, CAPILLARY
Glucose-Capillary: 117 mg/dL — ABNORMAL HIGH (ref 70–99)
Glucose-Capillary: 158 mg/dL — ABNORMAL HIGH (ref 70–99)
Glucose-Capillary: 61 mg/dL — ABNORMAL LOW (ref 70–99)
Glucose-Capillary: 64 mg/dL — ABNORMAL LOW (ref 70–99)
Glucose-Capillary: 74 mg/dL (ref 70–99)
Glucose-Capillary: 76 mg/dL (ref 70–99)
Glucose-Capillary: 91 mg/dL (ref 70–99)
Glucose-Capillary: 96 mg/dL (ref 70–99)

## 2024-01-14 LAB — COMPREHENSIVE METABOLIC PANEL WITH GFR
ALT: 13 U/L (ref 0–44)
AST: 28 U/L (ref 15–41)
Albumin: 2.6 g/dL — ABNORMAL LOW (ref 3.5–5.0)
Alkaline Phosphatase: 51 U/L (ref 38–126)
Anion gap: 10 (ref 5–15)
BUN: 5 mg/dL — ABNORMAL LOW (ref 8–23)
CO2: 23 mmol/L (ref 22–32)
Calcium: 7.7 mg/dL — ABNORMAL LOW (ref 8.9–10.3)
Chloride: 108 mmol/L (ref 98–111)
Creatinine, Ser: 0.6 mg/dL (ref 0.44–1.00)
GFR, Estimated: >60 mL/min (ref >60)
Glucose, Bld: 83 mg/dL (ref 70–99)
Potassium: 2.9 mmol/L — ABNORMAL LOW (ref 3.5–5.1)
Sodium: 141 mmol/L (ref 135–145)
Total Bilirubin: 1 mg/dL (ref 0.0–1.2)
Total Protein: 5.2 g/dL — ABNORMAL LOW (ref 6.5–8.1)

## 2024-01-14 LAB — BASIC METABOLIC PANEL WITH GFR
Anion gap: 11 (ref 5–15)
BUN: 5 mg/dL — ABNORMAL LOW (ref 8–23)
CO2: 18 mmol/L — ABNORMAL LOW (ref 22–32)
Calcium: 7.7 mg/dL — ABNORMAL LOW (ref 8.9–10.3)
Chloride: 111 mmol/L (ref 98–111)
Creatinine, Ser: 0.58 mg/dL (ref 0.44–1.00)
GFR, Estimated: >60 mL/min (ref >60)
Glucose, Bld: 95 mg/dL (ref 70–99)
Potassium: 4.2 mmol/L (ref 3.5–5.1)
Sodium: 140 mmol/L (ref 135–145)

## 2024-01-14 LAB — CBC
HCT: 38.8 % (ref 36.0–46.0)
Hemoglobin: 12.9 g/dL (ref 12.0–15.0)
MCH: 34.4 pg — ABNORMAL HIGH (ref 26.0–34.0)
MCHC: 33.2 g/dL (ref 30.0–36.0)
MCV: 103.5 fL — ABNORMAL HIGH (ref 80.0–100.0)
Platelets: 253 10*3/uL (ref 150–400)
RBC: 3.75 MIL/uL — ABNORMAL LOW (ref 3.87–5.11)
RDW: 15.4 % (ref 11.5–15.5)
WBC: 6.7 10*3/uL (ref 4.0–10.5)
nRBC: 0 % (ref 0.0–0.2)

## 2024-01-14 LAB — MAGNESIUM: Magnesium: 2 mg/dL (ref 1.7–2.4)

## 2024-01-14 MED ORDER — DEXTROSE 5 % IV SOLN: INTRAVENOUS | Status: DC

## 2024-01-14 MED ORDER — DEXTROSE 50 % IV SOLN
12.5000 g | INTRAVENOUS | Status: AC
  Administered 2024-01-14: 12.5 g via INTRAVENOUS
  Filled 2024-01-14: qty 50

## 2024-01-14 MED ORDER — DEXTROSE 50 % IV SOLN
INTRAVENOUS | Status: AC
  Administered 2024-01-14: 25 mL
  Filled 2024-01-14: qty 50

## 2024-01-14 MED ORDER — POTASSIUM CHLORIDE 20 MEQ PO PACK
40.0000 meq | PACK | Freq: Once | ORAL | Status: AC
  Administered 2024-01-14: 40 meq
  Filled 2024-01-14: qty 2

## 2024-01-14 MED ORDER — SODIUM PHOSPHATES 45 MMOLE/15ML IV SOLN
15.0000 mmol | Freq: Once | INTRAVENOUS | Status: AC
  Administered 2024-01-14: 15 mmol via INTRAVENOUS
  Filled 2024-01-14: qty 5

## 2024-01-14 MED ORDER — POTASSIUM CHLORIDE 10 MEQ/100ML IV SOLN
10.0000 meq | INTRAVENOUS | Status: AC
  Administered 2024-01-14 (×4): 10 meq via INTRAVENOUS
  Filled 2024-01-14 (×4): qty 100

## 2024-01-14 NOTE — Progress Notes (Signed)
  Inpatient Rehab Admissions Coordinator :  Per therapy recommendations patient was screened for CIR candidacy by Ottie Glazier RN MSN. Patient is not at a level to tolerate the intensity required to pursue a CIR admit . Vented. Noted from [facility] with spouse. The CIR admissions team will follow and monitor for progress and place a Rehab Consult order if felt to be appropriate. Please contact me with any questions.  Ottie Glazier RN MSN Admissions Coordinator [phone]

## 2024-01-14 NOTE — Progress Notes (Addendum)
 0730 patient wakes to voice and stimulation on continuous EEG. Patient follows commands  0800 patient placed on wean  0840 patient not having any resp effort vent changed back to previous mode Dex turned off  1430 patient very alert sitting up in bed gesturing for the ETT to come out. RT will attempt another wean. Bath and CHG completed  1730 failed bedside swallow lots of choking after taking 30 cc water

## 2024-01-14 NOTE — Progress Notes (Signed)
 [facility] ADULT ICU REPLACEMENT PROTOCOL   The patient does apply for the [facility] Adult ICU Electrolyte Replacment Protocol based on the criteria listed below:   1.Exclusion criteria: TCTS, ECMO, Dialysis, and Myasthenia Gravis patients 2. Is GFR >/= 30 ml/min? Yes.    Patient's GFR today is >60 3. Is SCr </= 2? Yes.   Patient's SCr is 0.60 mg/dL 4. Did SCr increase >/= 0.5 in 24 hours? No. 5.Pt's weight >40kg  Yes.   6. Abnormal electrolyte(s): k 2.9  7. Electrolytes replaced per protocol 8.  Call MD STAT for K+ </= 2.5, Phos </= 1, or Mag </= 1 Physician:  Mauri Pole, Angeleigh Chiasson A 01/14/2024 12:52 AM

## 2024-01-14 NOTE — Evaluation (Signed)
 Physical Therapy Evaluation Patient Details Name: [name] MRN: [id number] DOB: 1956-08-01 Today's Date: 01/14/2024  History of Present Illness  68 yo F presented 01/12/24 by EMS for AMS.  Witnessed seizure x 2 with EMS s/p versed. required intubation for airway protection and O2 desaturations. CT head: negative. Acute hypoxic respiratory insufficiency related to presumed aspiration RLL PNA/ pneumonitis +/- atelectasis with RLL collapse, s/p bronchoscopy 3/26. PMH: Seizure disorder, ETOH withdrawal, pancreatitis, anxiety, depression and SAH.   Clinical Impression  Pt presents with condition above and deficits mentioned below, see PT Problem List. PTA, she was mod I utilizing a SPC outside and holding onto furniture inside the home. She lives with her husband in a 1-level apartment with a level entrance. Currently, pt is demonstrating deficits in cognition (attempting to reach and pull at lines, needing blocking to prevent it), balance, activity tolerance, and gross strength. She tends to move her R leg more than her L but her L arm more than her R. She required total assist x2 to transition supine <> sit EOB and total assist initially for static sitting balance due to her strong posterior lean. Her lean improved once she had something, like the bed rail, to hold onto and pull on with her L UE, progressing to only needing CGA for static sitting balance. Deferred OOB mobility this date due to pt getting uncomfortable sitting up while intubated and her short stature limiting her feet from being able to make easy contact with the ground while sitting on the edge of an ICU bed. Will continue to follow acutely. She could greatly benefit from intensive inpatient rehab, > 3 hours/day, to maximize her return to baseline prior to d/c home.        If plan is discharge home, recommend the following: Two people to help with walking and/or transfers;Two people to help with  bathing/dressing/bathroom;Assistance with cooking/housework;Assistance with feeding;Direct supervision/assist for medications management;Direct supervision/assist for financial management;Assist for transportation;Supervision due to cognitive status   Can travel by private vehicle        Equipment Recommendations Other (comment) (TBD)  Recommendations for Other Services  Rehab consult    Functional Status Assessment Patient has had a recent decline in their functional status and demonstrates the ability to make significant improvements in function in a reasonable and predictable amount of time.     Precautions / Restrictions Precautions Precautions: Fall;Other (comment) Recall of Precautions/Restrictions: Impaired Precaution/Restrictions Comments: bil wrist restraints; OG tube; intubated Restrictions Weight Bearing Restrictions Per Provider Order: No      Mobility  Bed Mobility Overal bed mobility: Needs Assistance Bed Mobility: Supine to Sit, Sit to Supine     Supine to sit: Total assist, +2 for physical assistance, HOB elevated Sit to supine: Total assist, +2 for physical assistance   General bed mobility comments: Pt reliant on therapists for total assist x2 to manage her trunk and legs to transition supine <> sit L EOB. Pt initially leaned posteriorly upon sitting up but balance improved once she had her L hand on the bed rail for support    Transfers                   General transfer comment: did not attempt due to pt coughing and fighting ventilator sitting EOB    Ambulation/Gait               General Gait Details: did not attempt due to pt coughing and fighting ventilator sitting EOB  Stairs  Wheelchair Mobility     Tilt Bed    Modified Rankin (Stroke Patients Only)       Balance Overall balance assessment: Needs assistance Sitting-balance support: Single extremity supported, No upper extremity supported, Feet unsupported  (feet not supported due to pt's short stature in a ICU bed) Sitting balance-Leahy Scale: Poor Sitting balance - Comments: Pt initially pushing posteriorly and needing total assist for static sitting balance, but once her L hand was on the bed rail for support to pull anteriorly on her balance improved, progressing to CGA for safety       Standing balance comment: feet not supported due to pt's short stature in a ICU bed                             Pertinent Vitals/Pain Pain Assessment Pain Assessment: Faces Faces Pain Scale: Hurts little more Pain Location: grimacing with mobility, seemingly likely due to ETT Pain Descriptors / Indicators: Grimacing Pain Intervention(s): Limited activity within patient's tolerance, Monitored during session, Repositioned    Home Living Family/patient expects to be discharged to:: Private residence Living Arrangements: Spouse/significant other Available Help at Discharge: Family;Available 24 hours/day Type of Home: Apartment Home Access: Level entry       Home Layout: One level Home Equipment: Grab bars - tub/shower;Cane - single point      Prior Function Prior Level of Function : Independent/Modified Independent             Mobility Comments: Uses SPC outside, holds onto furniture and walls inside ADLs Comments: Pt independent with ADLs, husband and pt do IADLs, take public transportation     Extremity/Trunk Assessment   Upper Extremity Assessment Upper Extremity Assessment: Defer to OT evaluation RUE Deficits / Details: Not moving RUE as much to command as LUE and LUE is overall weak RUE Coordination: decreased fine motor;decreased gross motor LUE Deficits / Details: over all weak with asked effort, pushing strongly and gripping strongly on upper bed rail when sitting on EOB LUE Coordination: decreased fine motor;decreased gross motor    Lower Extremity Assessment Lower Extremity Assessment: RLE deficits/detail;LLE  deficits/detail;Difficult to assess due to impaired cognition RLE Deficits / Details: able to hold bil knees in extension against gravity, but tends to more R leg more than L LLE Deficits / Details: able to hold bil knees in extension against gravity, but tends to more R leg more than L    Cervical / Trunk Assessment Cervical / Trunk Assessment: Normal  Communication   Communication Communication: Impaired Factors Affecting Communication: Trach/intubated    Cognition Arousal: Alert Behavior During Therapy: Anxious, Restless   PT - Cognitive impairments: Difficult to assess Difficult to assess due to: Intubated                     PT - Cognition Comments: Pt nodding her head 'yes' to most questions, even when asked if the year was 2025 then 2010. Follows simple cues inconsistently. Restlessly reaching for ETT with arms, needing assistance to block and prevent her from pulling at lines Following commands: Impaired Following commands impaired: Follows one step commands inconsistently, Follows one step commands with increased time     Cueing Cueing Techniques: Verbal cues, Gestural cues, Tactile cues, Visual cues     General Comments General comments (skin integrity, edema, etc.): vent 40% PEEP 8    Exercises     Assessment/Plan    PT Assessment Patient needs continued  PT services  PT Problem List Decreased strength;Decreased activity tolerance;Decreased balance;Decreased mobility;Decreased coordination;Decreased cognition;Decreased safety awareness;Cardiopulmonary status limiting activity       PT Treatment Interventions DME instruction;Gait training;Stair training;Functional mobility training;Therapeutic activities;Therapeutic exercise;Balance training;Neuromuscular re-education;Cognitive remediation;Patient/family education    PT Goals (Current goals can be found in the Care Plan section)  Acute Rehab PT Goals Patient Stated Goal: per husband, for pt to improve; pt  unable to specify, she is intubated PT Goal Formulation: With patient/family Time For Goal Achievement: 01/28/24 Potential to Achieve Goals: Good    Frequency Min 3X/week     Co-evaluation PT/OT/SLP Co-Evaluation/Treatment: Yes Reason for Co-Treatment: Complexity of the patient's impairments (multi-system involvement);Necessary to address cognition/behavior during functional activity;For patient/therapist safety;To address functional/ADL transfers PT goals addressed during session: Mobility/safety with mobility;Balance         AM-PAC PT "6 Clicks" Mobility  Outcome Measure Help needed turning from your back to your side while in a flat bed without using bedrails?: Total Help needed moving from lying on your back to sitting on the side of a flat bed without using bedrails?: Total Help needed moving to and from a bed to a chair (including a wheelchair)?: Total Help needed standing up from a chair using your arms (e.g., wheelchair or bedside chair)?: Total Help needed to walk in hospital room?: Total Help needed climbing 3-5 steps with a railing? : Total 6 Click Score: 6    End of Session Equipment Utilized During Treatment: Oxygen Activity Tolerance: Patient tolerated treatment well Patient left: in bed;with bed alarm set;with call bell/phone within reach;with family/visitor present;with restraints reapplied Nurse Communication: Other (comment);Mobility status (RN checked on pt's vitals during session) PT Visit Diagnosis: Unsteadiness on feet (R26.81);Muscle weakness (generalized) (M62.81);Difficulty in walking, not elsewhere classified (R26.2);Other symptoms and signs involving the nervous system (R29.898)    Time: 1342-1400 PT Time Calculation (min) (ACUTE ONLY): 18 min   Charges:   PT Evaluation $PT Eval Moderate Complexity: 1 Mod   PT General Charges $$ ACUTE PT VISIT: 1 Visit         Virgil Benedict, PT, DPT Acute Rehabilitation Services  Office:  [phone]   [name] 01/14/2024, 3:02 PM

## 2024-01-14 NOTE — Progress Notes (Signed)
 LTM EEG discontinued - no skin breakdown at [facility].

## 2024-01-14 NOTE — Progress Notes (Signed)
 NEUROLOGY CONSULT FOLLOW UP NOTE   Date of service: January 14, 2024 Patient Name: [name] MRN:  [id number] DOB:  1955-11-01   Brief Hx  Intubated 3/26 in ED.   3/27 AM Still on sedation with fentanyl and versed.   Home AEDs resumed inpatient: Gabapentin 300mg  TID Vimpat 50mg  BID Keppra 500mg  BID  Interval Hx/subjective   - versed off since 3/27 morning, now on precedex  - weaning on vent     Vitals   Vitals:   01/14/24 0400 01/14/24 0500 01/14/24 0600 01/14/24 0800  BP: (!) 147/84 (!) 146/86 (!) 155/90 (!) 156/85  Pulse: (!) 47 (!) 48 (!) 52 (!) 47  Resp: 18 18 18 14   Temp: (!) 97.5 F (36.4 C) (!) 97.5 F (36.4 C) (!) 97.5 F (36.4 C) 97.7 F (36.5 C)  TempSrc:    Bladder  SpO2: 100% 100% 100% 100%  Weight:   71.6 kg   Height:        Current vital signs: BP (!) 156/85 (BP Location: Right Leg)   Pulse (!) 47   Temp 97.7 F (36.5 C) (Bladder)   Resp 14   Ht 5\' 1"  (1.549 m)   Wt 71.6 kg   SpO2 100%   BMI 29.83 kg/m  Vital signs in last 24 hours: Temp:  [96.6 F (35.9 C)-98.1 F (36.7 C)] 97.7 F (36.5 C) (03/28 0800) Pulse Rate:  [46-109] 47 (03/28 0800) Resp:  [10-30] 14 (03/28 0800) BP: (91-156)/(54-90) 156/85 (03/28 0800) SpO2:  [98 %-100 %] 100 % (03/28 0800) FiO2 (%):  [40 %] 40 % (03/28 0800) Weight:  [71.6 kg] 71.6 kg (03/28 0600)  Body mass index is 29.83 kg/m.  Physical Exam   Constitutional: Brighter appearing today  Psych: Social smile present  HENT: ETT in place No scleral injection.  Cardiovascular: Normal rate and regular rhythm.  Respiratory: Mechanically ventilated on PS GI: Soft.  No distension.   Neurologic Examination   Neuro: on predex  Mental Status: Patient intubated, eyes closed on examiner arrival to room.  Opens eyes quickly, folllows simple but not complex commands Cranial Nerves: II: PERRL   III,IV, VI, VIII: Orients to examiners voice bilaterally V: Facial sensation is symmetric to light  eyelash brush VII: Facial appears symmetric with limits of ETT Motor: BUE restriants in place. Squeezes and lets go bilaterally with nearly equal grip strength. Wiggles toes bilaterally  Sensory: Reactive to touch in all four  Medications  Current Facility-Administered Medications:    0.9 %  sodium chloride infusion, 250 mL, Intravenous, Continuous, Simonne Martinet, NP   acetaminophen (TYLENOL) tablet 650 mg, 650 mg, Per Tube, Q4H PRN, Agarwala, Ravi, MD   albuterol (PROVENTIL) (2.5 MG/3ML) 0.083% nebulizer solution 2.5 mg, 2.5 mg, Nebulization, Q4H PRN, Selmer Dominion B, NP   Ampicillin-Sulbactam (UNASYN) 3 g in sodium chloride 0.9 % 100 mL IVPB, 3 g, Intravenous, Q6H, Wilmer Floor, RPH, Stopped at 01/14/24 0606   Chlorhexidine Gluconate Cloth 2 % PADS 6 each, 6 each, Topical, Daily, Agarwala, Ravi, MD, 6 each at 01/13/24 0911   dexmedetomidine (PRECEDEX) 400 MCG/100ML (4 mcg/mL) infusion, 0-1.2 mcg/kg/hr, Intravenous, Titrated, Luciano Cutter, MD, Last Rate: 11.99 mL/hr at 01/14/24 0806, 0.7 mcg/kg/hr at 01/14/24 0806   enoxaparin (LOVENOX) injection 40 mg, 40 mg, Subcutaneous, Q24H, Agarwala, Ravi, MD, 40 mg at 01/13/24 1102   famotidine (PEPCID) tablet 20 mg, 20 mg, Per Tube, BID, Agarwala, Ravi, MD, 20 mg at 01/13/24 2107   fentaNYL (SUBLIMAZE) injection  25-100 mcg, 25-100 mcg, Intravenous, Q30 min PRN, Simonne Martinet, NP, 50 mcg at 01/14/24 6578   folic acid (FOLVITE) tablet 1 mg, 1 mg, Per Tube, Daily, Selmer Dominion B, NP, 1 mg at 01/13/24 0911   gabapentin (NEURONTIN) capsule 300 mg, 300 mg, Per Tube, TID, Rejeana Brock, MD, 300 mg at 01/13/24 2107   insulin aspart (novoLOG) injection 0-9 Units, 0-9 Units, Subcutaneous, Q4H, Simpson, Paula B, NP   lacosamide (VIMPAT) 50 mg in sodium chloride 0.9 % 25 mL IVPB, 50 mg, Intravenous, Q12H, Rejeana Brock, MD, Paused at 01/13/24 2249   levETIRAcetam (KEPPRA) IVPB 500 mg/100 mL premix, 500 mg, Intravenous, Q12H,  Rejeana Brock, MD, Paused at 01/13/24 2157   midazolam (VERSED) bolus via infusion 0-5 mg, 0-5 mg, Intravenous, Q1H PRN, Linwood Dibbles, MD, 2 mg at 01/13/24 0636   norepinephrine (LEVOPHED) 4mg  in (0.016 mg/mL) premix infusion, 2-10 mcg/min, Intravenous, Titrated, Simonne Martinet, NP, Stopped at 01/13/24 1555   ondansetron (ZOFRAN) injection 4 mg, 4 mg, Intravenous, Q6H PRN, Lynnell Catalan, MD   Oral care mouth rinse, 15 mL, Mouth Rinse, Q2H, Selmer Dominion B, NP, 15 mL at 01/14/24 0740   Oral care mouth rinse, 15 mL, Mouth Rinse, PRN, Selmer Dominion B, NP   polyethylene glycol (MIRALAX / GLYCOLAX) packet 17 g, 17 g, Per Tube, Daily, Selmer Dominion B, NP, 17 g at 01/12/24 1259   thiamine (VITAMIN B1) tablet 100 mg, 100 mg, Per Tube, Daily, Selmer Dominion B, NP, 100 mg at 01/13/24 0911  Labs and Diagnostic Imaging    Basic Metabolic Panel: Recent Labs  Lab 01/12/24 0759 01/12/24 0814 01/12/24 1050 01/12/24 1812 01/13/24 0654 01/13/24 2359  NA 139 138 140  --  140 141  K 2.8* 2.7* 3.2*  --  3.0* 2.9*  CL 100  --   --   --  108 108  CO2 23  --   --   --  18* 23  GLUCOSE 107*  --   --   --  85 83  BUN 5*  --   --   --  6* 5*  CREATININE 1.01*  --   --  0.61 0.59 0.60  CALCIUM 9.1  --   --   --  8.0* 7.7*  MG  --   --   --  1.4*  --  2.0  PHOS  --   --   --   --  2.3*  --     CBC: Recent Labs  Lab 01/12/24 0759 01/12/24 0814 01/12/24 1050 01/13/24 0654 01/13/24 2359  WBC 9.4  --   --  7.7 6.7  NEUTROABS 8.4*  --   --   --   --   HGB 14.2 15.3* 12.9 13.1 12.9  HCT 44.0 45.0 38.0 40.2 38.8  MCV 106.8*  --   --  103.3* 103.5*  PLT 274  --   --  233 253    Coagulation Studies: No results for input(s): "LABPROT", "INR" in the last 72 hours.    HgbA1c:  Lab Results  Component Value Date   HGBA1C 5.0 05/17/2022   Urine Drug Screen:     Component Value Date/Time   LABOPIA NONE DETECTED 01/12/2024 1107   COCAINSCRNUR NONE DETECTED 01/12/2024 1107    LABBENZ POSITIVE (A) 01/12/2024 1107   AMPHETMU NONE DETECTED 01/12/2024 1107   THCU NONE DETECTED 01/12/2024 1107   LABBARB NONE DETECTED 01/12/2024 1107    Alcohol Level  Component Value Date/Time   ETH <10 01/12/2024 0759   INR  Lab Results  Component Value Date   INR 1.0 05/17/2022   APTT  Lab Results  Component Value Date   APTT 22 (L) 05/17/2022   AED levels:  Lab Results  Component Value Date   LEVETIRACETA <2.0 (L) 01/12/2024    CT Head without contrast(Personally reviewed): 1. No evidence of acute intracranial abnormality. 2. Mild chronic small vessel ischemic disease.  LTM EEG: 3/26 to 3/27 AM - Continuous slow, generalized and maximal right posterior temporal  LTM EEG: 3/27 to 3/28 AM Report pending  Assessment  Destanee Atonya Templer is a 68 y.o. female with a history of seizures with likely medication noncompliance who is presenting with multiple seizures without return to baseline (status epilepticus).  She has been intubated and will need continuous EEG monitoring to ensure that she has stopped seizing and is not in nonconvulsive status epilepticus. LTM is running, report pending.   Exam continues to improve today   Recommendations  - Consider precedex for sedation for if unable to extubate rather than versed - Appreciate CCM plan to try to extubate - D/C Overnight EEG if it remains negative for seizures today - Continue Keppra 500 mg twice daily (home dose) - Continue Gabapentin 300 mg 3 times daily (home dose) - Continue Lacosamide 50 mg twice daily (home dose) - Neurology will follow plan to sign off if EEG neg / no further seizures - Appreciate management of comorbitidies by CCM ______________________________________________________________________   Brooke Dare MD-PhD [facility] [phone]  CRITICAL CARE Performed by: Gordy Councilman   Total critical care time: 30 minutes  Critical care time was exclusive of  separately billable procedures and treating other patients.  Critical care was necessary to treat or prevent imminent or life-threatening deterioration.  Critical care was time spent personally by me on the following activities: development of treatment plan with patient and/or surrogate as well as nursing, discussions with consultants, evaluation of patient's response to treatment, examination of patient, obtaining history from patient or surrogate, ordering and performing treatments and interventions, ordering and review of laboratory studies, ordering and review of radiographic studies, pulse oximetry and re-evaluation of patient's condition.

## 2024-01-14 NOTE — Procedures (Signed)
 Extubation Procedure Note  Patient Details:   Name: [name] DOB: 07-30-1956 MRN: [id number]   Airway Documentation:    Vent end date: 01/14/24 Vent end time: 1553   Evaluation  O2 sats: stable throughout Complications: No apparent complications Patient did tolerate procedure well. Bilateral Breath Sounds: Clear   Yes  Pt extubated to 3L [place], pt tolerating well at this time. Cuff leak present, no stridor noted.   [name] 01/14/2024, 3:57 PM

## 2024-01-14 NOTE — Progress Notes (Addendum)
 NAME:  [name], MRN:  [id number], DOB:  [birth year], LOS: 2 ADMISSION DATE:  01/12/2024, CONSULTATION DATE:  01/12/24 REFERRING MD:  Dr. Lynelle Doctor, CHIEF COMPLAINT:  AMS   History of Present Illness:  Pt encephalopathic, HPI obtained from EMR  28 yoF w/ PMH significant for seizures with med non-compliance, ETOH abuse, anxiety/ depression presented by EMS for AMS.  Witnessed seizure x 2 with EMS s/p versed.  On arrival to ER, no further seizure activity but required intubation for airway protection and O2 desaturations.  CTH neg.  Loaded with keppra and vimat started.  Sedated on fentanyl/ versed gtt.  Neurology consulted.  Labs show AKI, ETOH neg, K 2.7.  PCCM to admit.   Pertinent  Medical History   Past Medical History:  Diagnosis Date   Alcohol dependence (HCC)    Anxiety    Chronic pain    Depression    Diverticulitis    Herniated cervical disc    Pancreatitis    Seizures (HCC)    alcoholic seizures   Significant Hospital Events: Including procedures, antibiotic start and stop dates in addition to other pertinent events   3/26 admit, intubated. Bronchoscopy for RLL collapse  Interim History / Subjective:  Minimal vent settings Weaned to [employer] LTM with no seizures Objective   Blood pressure (!) 155/90, pulse (!) 52, temperature (!) 97.5 F (36.4 C), resp. rate 18, height 5\' 1"  (1.549 m), weight 71.6 kg, SpO2 100%.    Vent Mode: PRVC FiO2 (%):  [40 %] 40 % Set Rate:  [18 bmp] 18 bmp Vt Set:  [380 mL] 380 mL PEEP:  [8 cmH20] 8 cmH20 Plateau Pressure:  [16 cmH20-18 cmH20] 17 cmH20   Intake/Output Summary (Last 24 hours) at 01/14/2024 0751 Last data filed at 01/14/2024 0700 Gross per 24 hour  Intake 1772.96 ml  Output 1130 ml  Net 642.96 ml   Filed Weights   01/12/24 0756 01/13/24 0400 01/14/24 0600  Weight: 64 kg 68.5 kg 71.6 kg   Physical Exam: General: Chronically ill-appearing, no acute distress, drowsy HENT: [place], AT, ETT in place Eyes:  EOMI, no scleral icterus Respiratory: Clear to auscultation bilaterally.  No crackles, wheezing or rales Cardiovascular: RRR, -M/R/G, no JVD GI: BS+, soft, nontender Extremities:-Edema,-tenderness Neuro: Drowsy, opens eyes to voice, not following commands, CNII-XII grossly intact  Imaging, labs and test in EMR in the last 24 hours reviewed independently by me. Pertinent findings below: CXR with some improvement in RLL atelectasis K 2.9 Mg 2.0 CO2 normalized   Resolved Hospital Problem list   AKI Assessment & Plan:   Acute encephalopathy, presumed status epilepticus Hx seizures with med non compliance  Hx ETOH abuse, unclear if current/ prior use, neg on admit P:  - Neurology following - AEDs per Neurology> home doses of keppra, vimpat gabapentin  - cEEG - neuro watch/ seizure precautions - cont supportive care - empiric thiamine/ folate, monitor for possible withdrawal  Acute hypoxic respiratory insufficiency related to above Presumed aspiration RLL PNA/ pneumonitis +/- atelectasis with RLL collapse S/p bronchoscopy 3/26 P:  - Full vent support - LTVV, 4-8cc/kg IBW with goal Pplat<30 and DP<15 - VAP prevention protocol/ PPI - PAD protocol for sedation> wean precedex for RASS -1 - prn CXR, prn ABG - wean PEEP/ FiO2 as able for SpO2 >92% - SBT/WUA when able.  Consider extubation - aggressive pulmonary hygiene - prn albuterol - empiric Unasyn coverage  Sedation related hypotension - resolved - Off levophed - Wean for MAP >  65  Hypokalemia - Replete - Trend  Hypoglycemia level 1- overnight - If unable to extubate will need to start TF   Best Practice (right click and "Reselect all SmartList Selections" daily)   Diet/type: NPO DVT prophylaxis LMWH Pressure ulcer(s): N/A GI prophylaxis: H2B Lines: N/A Foley:  Yes, and it is still needed Code Status:  full code Last date of multidisciplinary goals of care discussion [pending] Discussed with spouse at bedside  3/27. Remain full code but would not want long term life support if she condition untreatable  NOK> daughter's number invalid, SO, [name] [phone] no answer/ VM, other number not in service  Critical care time: 38 mins   The patient is critically ill with multiple organ systems failure and requires high complexity decision making for assessment and support, frequent evaluation and titration of therapies, application of advanced monitoring technologies and extensive interpretation of multiple databases.  Independent Critical Care Time: 38 Minutes.   Mechele Collin, M.D. [facility] Pulmonary/Critical Care Medicine 01/14/2024 7:51 AM   Please see Amion for pager number to reach on-call Pulmonary and Critical Care Team.

## 2024-01-14 NOTE — Procedures (Addendum)
 Patient Name: [name]  MRN: [id number]  Epilepsy Attending: Charlsie Quest  Referring Physician/Provider: Rejeana Brock, MD  Duration: 01/13/2024 1212 to 01/14/2024 1115   Patient history:  68 y.o. female with a history of seizures with likely medication noncompliance who is presenting with multiple seizures without return to baseline (status epilepticus). EEG to evaluate for seizure   Level of alertness: lethargic    AEDs during EEG study: LEV, LCM, GBP   Technical aspects: This EEG study was done with scalp electrodes positioned according to the 10-20 International system of electrode placement. Electrical activity was reviewed with band pass filter of 1-70Hz , sensitivity of 7 uV/mm, display speed of 35mm/sec with a 60Hz  notched filter applied as appropriate. EEG data were recorded continuously and digitally stored.  Video monitoring was available and reviewed as appropriate.   Description: EEG showed continuous generalized 3 to 6 Hz theta-delta slowing admixed with 15 to 18 Hz beta activity distributed symmetrically and diffusely.  Independent poly spikes were noted in left and right posterior quadrant, qasi periodic at 1Hz . Hyperventilation and photic stimulation were not performed.    ABNORMALITY - Polyspikes, left and right posterior quadrant - Continuous slow, generalized   IMPRESSION: This study is consistent with patient's history of independent epileptogenicity arising from left and right posterior quadrant with increased risk of seizure recurrence. Additionally there is moderate diffuse encephalopathy. No seizures were seen throughout the recording.   Choya Tornow Annabelle Harman

## 2024-01-14 NOTE — Evaluation (Signed)
 Occupational Therapy Evaluation Patient Details Name: [name] MRN: [id number] DOB: [birth year] Today's Date: 01/14/2024   History of Present Illness   63 yoF w/ PMH presented by EMS for AMS.  Witnessed seizure x 2 with EMS s/p versed.  required intubation for airway protection and O2 desaturations. CT head:negative. Acute hypoxic respiratory insufficiency related to presumed aspiration RLL PNA/ pneumonitis +/- atelectasis with RLL collapse, s/p bronchoscopy 3/26.  PMH: Seizure disorder, ETOH withdrawal, pancreatitis, anxiety, depression and SAH.     Clinical Impressions This 68 yo female admitted with above presents to acute OT with PLOF of being Mod I to independent with basic ADLs and IADLs from a SPC level. Currently she is total A for all basic ADLs and +2 for mobility. She will continue to benefit from acute OT with follow up from intensive inpatient follow-up therapy, >3 hours/day.      If plan is discharge home, recommend the following:   Two people to help with walking and/or transfers;Two people to help with bathing/dressing/bathroom;Assistance with cooking/housework;Assistance with feeding;Assist for transportation;Direct supervision/assist for financial management;Direct supervision/assist for medications management;Supervision due to cognitive status     Functional Status Assessment   Patient has had a recent decline in their functional status and demonstrates the ability to make significant improvements in function in a reasonable and predictable amount of time.     Equipment Recommendations   Tub/shower seat;Tub/shower bench (one v. the other (will need to determine based on progress))      Precautions/Restrictions   Precautions Precautions: Fall (seizures) Restrictions Weight Bearing Restrictions Per Provider Order: No     Mobility Bed Mobility Overal bed mobility: Needs Assistance Bed Mobility: Supine to Sit, Sit to Supine     Supine to  sit: Total assist, +2 for physical assistance, HOB elevated Sit to supine: Total assist, +2 for physical assistance   General bed mobility comments: A for legs and trunk    Transfers                   General transfer comment: did not attempt due to pt coughing and fighting ventilator sitting EOB      Balance Overall balance assessment: Needs assistance Sitting-balance support: Single extremity supported, No upper extremity supported, Feet unsupported (feet not supported due to pt's short stature in a ICU bed) Sitting balance-Leahy Scale: Zero Sitting balance - Comments: total A to CGA (when holding onto bed rail with LUE)                                   ADL either performed or assessed with clinical judgement   ADL                                         General ADL Comments: total A at bed level at this time     Vision   Additional Comments: to be further tested--appears normal            Pertinent Vitals/Pain Pain Assessment Pain Assessment: CPOT Facial Expression: Tense (sitting on EOB) Body Movements: Restlessness (sitting on EOB) Muscle Tension: Tense, rigid (sitting on EOB) Compliance with ventilator (intubated pts.): Fighting ventilator (sitting on EOB) Vocalization (extubated pts.): N/A CPOT Total: 6 Pain Intervention(s): Repositioned     Extremity/Trunk Assessment Upper Extremity Assessment Upper Extremity Assessment: Right hand  dominant;RUE deficits/detail;LUE deficits/detail RUE Deficits / Details: Not moving RUE as much to command as LUE and LUE is overall weak RUE Coordination: decreased fine motor;decreased gross motor LUE Deficits / Details: over all weak with asked effort, pushing strongly and gripping strongly on upper bed rail when sitting on EOB LUE Coordination: decreased fine motor;decreased gross motor           Communication Communication Communication: Impaired Factors Affecting Communication:  Trach/intubated   Cognition Arousal: Alert Behavior During Therapy: Anxious Cognition: Cognition impaired   Orientation impairments: Time Awareness: Intellectual awareness impaired, Online awareness impaired   Attention impairment (select first level of impairment): Sustained attention Executive functioning impairment (select all impairments): Initiation, Organization, Sequencing, Reasoning, Problem solving                   Following commands: Impaired Following commands impaired: Follows one step commands inconsistently, Follows one step commands with increased time     Cueing    Cueing Techniques: Verbal cues;Gestural cues;Tactile cues;Visual cues              Home Living Family/patient expects to be discharged to:: Private residence Living Arrangements: Spouse/significant other Available Help at Discharge: Family;Available 24 hours/day Type of Home: Apartment Home Access: Level entry     Home Layout: One level     Bathroom Shower/Tub: Tub/shower unit;Curtain   Firefighter: Standard     Home Equipment: Grab bars - tub/shower          Prior Functioning/Environment Prior Level of Function : Independent/Modified Independent             Mobility Comments: Uses SPC outside, holds onto furniture and walls inside ADLs Comments: Pt independent with ADLs, husband and pt do IADLs, take public transportation    OT Problem List: Decreased strength;Decreased range of motion;Decreased activity tolerance;Impaired balance (sitting and/or standing);Impaired UE functional use;Cardiopulmonary status limiting activity   OT Treatment/Interventions: Self-care/ADL training;DME and/or AE instruction;Balance training;Patient/family education;Therapeutic activities      OT Goals(Current goals can be found in the care plan section)   Acute Rehab OT Goals Patient Stated Goal: unable to state due to vent; husband would like for her to go home OT Goal  Formulation: With family Time For Goal Achievement: 01/28/24 Potential to Achieve Goals: Good   OT Frequency:  Min 2X/week       AM-PAC OT "6 Clicks" Daily Activity     Outcome Measure Help from another person eating meals?: Total Help from another person taking care of personal grooming?: Total Help from another person toileting, which includes using toliet, bedpan, or urinal?: Total Help from another person bathing (including washing, rinsing, drying)?: Total Help from another person to put on and taking off regular upper body clothing?: Total Help from another person to put on and taking off regular lower body clothing?: Total 6 Click Score: 6   End of Session Nurse Communication:  (RN in to check on [place] when vent was alarming)  Activity Tolerance: Other (comment) (limited secondary to coughing/fighting vent) Patient left: with bed alarm set;with family/visitor present;with restraints reapplied  OT Visit Diagnosis: Other abnormalities of gait and mobility (R26.89);Unsteadiness on feet (R26.81);Muscle weakness (generalized) (M62.81);Cognitive communication deficit (R41.841);Other symptoms and signs involving cognitive function Symptoms and signs involving cognitive functions:  (seizures and vent)                Time: 9147-8295 OT Time Calculation (min): 16 min Charges:  OT General Charges $OT Visit: 1 Visit OT Evaluation $  OT Eval Moderate Complexity: 1 Mod  Cathy L. OT Acute Rehabilitation Services Office [phone]    Evette Georges 01/14/2024, 2:17 PM

## 2024-01-14 NOTE — Plan of Care (Signed)
  Problem: Education: Goal: Knowledge of General Education information will improve Description: Including pain rating scale, medication(s)/side effects and non-pharmacologic comfort measures Outcome: Progressing   Problem: Health Behavior/Discharge Planning: Goal: Ability to manage health-related needs will improve Outcome: Progressing   Problem: Clinical Measurements: Goal: Ability to maintain clinical measurements within normal limits will improve Outcome: Progressing Goal: Will remain free from infection Outcome: Progressing Goal: Diagnostic test results will improve Outcome: Progressing Goal: Respiratory complications will improve Outcome: Progressing Goal: Cardiovascular complication will be avoided Outcome: Progressing   Problem: Activity: Goal: Risk for activity intolerance will decrease Outcome: Progressing   Problem: Nutrition: Goal: Adequate nutrition will be maintained Outcome: Not Progressing   Problem: Coping: Goal: Level of anxiety will decrease Outcome: Progressing   Problem: Elimination: Goal: Will not experience complications related to bowel motility Outcome: Progressing Goal: Will not experience complications related to urinary retention Outcome: Not Progressing   Problem: Pain Managment: Goal: General experience of comfort will improve and/or be controlled Outcome: Progressing   Problem: Safety: Goal: Ability to remain free from injury will improve Outcome: Not Progressing   Problem: Skin Integrity: Goal: Risk for impaired skin integrity will decrease Outcome: Not Progressing   Problem: Safety: Goal: Non-violent Restraint(s) Outcome: Not Progressing

## 2024-01-15 DIAGNOSIS — E16A1 Hypoglycemia level 1: Secondary | ICD-10-CM | POA: Diagnosis not present

## 2024-01-15 DIAGNOSIS — G40901 Epilepsy, unspecified, not intractable, with status epilepticus: Secondary | ICD-10-CM | POA: Diagnosis not present

## 2024-01-15 DIAGNOSIS — J9601 Acute respiratory failure with hypoxia: Secondary | ICD-10-CM | POA: Diagnosis not present

## 2024-01-15 LAB — GLUCOSE, CAPILLARY
Glucose-Capillary: 101 mg/dL — ABNORMAL HIGH (ref 70–99)
Glucose-Capillary: 127 mg/dL — ABNORMAL HIGH (ref 70–99)
Glucose-Capillary: 144 mg/dL — ABNORMAL HIGH (ref 70–99)
Glucose-Capillary: 71 mg/dL (ref 70–99)
Glucose-Capillary: 88 mg/dL (ref 70–99)
Glucose-Capillary: 91 mg/dL (ref 70–99)
Glucose-Capillary: 93 mg/dL (ref 70–99)

## 2024-01-15 LAB — CBC
HCT: 39.4 % (ref 36.0–46.0)
Hemoglobin: 13 g/dL (ref 12.0–15.0)
MCH: 34.8 pg — ABNORMAL HIGH (ref 26.0–34.0)
MCHC: 33 g/dL (ref 30.0–36.0)
MCV: 105.3 fL — ABNORMAL HIGH (ref 80.0–100.0)
Platelets: 287 10*3/uL (ref 150–400)
RBC: 3.74 MIL/uL — ABNORMAL LOW (ref 3.87–5.11)
RDW: 15.1 % (ref 11.5–15.5)
WBC: 6.7 10*3/uL (ref 4.0–10.5)
nRBC: 0 % (ref 0.0–0.2)

## 2024-01-15 LAB — BASIC METABOLIC PANEL WITH GFR
Anion gap: 15 (ref 5–15)
Anion gap: 12 (ref 5–15)
Anion gap: 13 (ref 5–15)
BUN: <5 mg/dL — ABNORMAL LOW (ref 8–23)
BUN: <5 mg/dL — ABNORMAL LOW (ref 8–23)
BUN: <5 mg/dL — ABNORMAL LOW (ref 8–23)
CO2: 19 mmol/L — ABNORMAL LOW (ref 22–32)
CO2: 25 mmol/L (ref 22–32)
CO2: 22 mmol/L (ref 22–32)
Calcium: 7.9 mg/dL — ABNORMAL LOW (ref 8.9–10.3)
Calcium: 8.2 mg/dL — ABNORMAL LOW (ref 8.9–10.3)
Calcium: 8.3 mg/dL — ABNORMAL LOW (ref 8.9–10.3)
Chloride: 107 mmol/L (ref 98–111)
Chloride: 100 mmol/L (ref 98–111)
Chloride: 102 mmol/L (ref 98–111)
Creatinine, Ser: 0.61 mg/dL (ref 0.44–1.00)
Creatinine, Ser: 0.58 mg/dL (ref 0.44–1.00)
Creatinine, Ser: 0.52 mg/dL (ref 0.44–1.00)
GFR, Estimated: >60 mL/min (ref >60)
GFR, Estimated: >60 mL/min (ref >60)
GFR, Estimated: >60 mL/min (ref >60)
Glucose, Bld: 93 mg/dL (ref 70–99)
Glucose, Bld: 125 mg/dL — ABNORMAL HIGH (ref 70–99)
Glucose, Bld: 102 mg/dL — ABNORMAL HIGH (ref 70–99)
Potassium: 2.6 mmol/L — CL (ref 3.5–5.1)
Potassium: 2.8 mmol/L — ABNORMAL LOW (ref 3.5–5.1)
Potassium: 3 mmol/L — ABNORMAL LOW (ref 3.5–5.1)
Sodium: 141 mmol/L (ref 135–145)
Sodium: 137 mmol/L (ref 135–145)
Sodium: 137 mmol/L (ref 135–145)

## 2024-01-15 LAB — MAGNESIUM: Magnesium: 1.6 mg/dL — ABNORMAL LOW (ref 1.7–2.4)

## 2024-01-15 LAB — LACOSAMIDE: Lacosamide: <0.5 ug/mL — ABNORMAL LOW (ref 5.0–10.0)

## 2024-01-15 MED ORDER — THIAMINE MONONITRATE 100 MG PO TABS
100.0000 mg | ORAL_TABLET | Freq: Every day | ORAL | Status: DC
  Administered 2024-01-16 – 2024-01-20 (×5): 100 mg via ORAL
  Filled 2024-01-15 (×5): qty 1

## 2024-01-15 MED ORDER — AMLODIPINE BESYLATE 5 MG PO TABS
10.0000 mg | ORAL_TABLET | Freq: Every day | ORAL | Status: DC
  Administered 2024-01-15 – 2024-01-20 (×6): 10 mg via ORAL
  Filled 2024-01-15 (×2): qty 2
  Filled 2024-01-15: qty 1
  Filled 2024-01-15 (×3): qty 2

## 2024-01-15 MED ORDER — MAGNESIUM SULFATE 2 GM/50ML IV SOLN
2.0000 g | Freq: Once | INTRAVENOUS | Status: AC
  Administered 2024-01-15: 2 g via INTRAVENOUS
  Filled 2024-01-15: qty 50

## 2024-01-15 MED ORDER — POTASSIUM CHLORIDE 10 MEQ/100ML IV SOLN
10.0000 meq | INTRAVENOUS | Status: AC
Start: 2024-01-15 — End: 2024-01-15
  Administered 2024-01-15 (×8): 10 meq via INTRAVENOUS
  Filled 2024-01-15 (×8): qty 100

## 2024-01-15 MED ORDER — FAMOTIDINE 20 MG PO TABS
20.0000 mg | ORAL_TABLET | Freq: Two times a day (BID) | ORAL | Status: DC
  Administered 2024-01-15 – 2024-01-20 (×10): 20 mg via ORAL
  Filled 2024-01-15 (×10): qty 1

## 2024-01-15 MED ORDER — POLYETHYLENE GLYCOL 3350 17 G PO PACK
17.0000 g | PACK | Freq: Every day | ORAL | Status: DC
  Administered 2024-01-16 – 2024-01-17 (×2): 17 g via ORAL
  Filled 2024-01-15 (×4): qty 1

## 2024-01-15 MED ORDER — FOLIC ACID 1 MG PO TABS
1.0000 mg | ORAL_TABLET | Freq: Every day | ORAL | Status: DC
  Administered 2024-01-16 – 2024-01-20 (×5): 1 mg via ORAL
  Filled 2024-01-15 (×5): qty 1

## 2024-01-15 MED ORDER — INSULIN ASPART 100 UNIT/ML IJ SOLN
0.0000 [IU] | Freq: Three times a day (TID) | INTRAMUSCULAR | Status: DC
  Administered 2024-01-15: 1 [IU] via SUBCUTANEOUS
  Administered 2024-01-16: 3 [IU] via SUBCUTANEOUS
  Administered 2024-01-16 – 2024-01-20 (×6): 1 [IU] via SUBCUTANEOUS

## 2024-01-15 MED ORDER — GABAPENTIN 300 MG PO CAPS
300.0000 mg | ORAL_CAPSULE | Freq: Three times a day (TID) | ORAL | Status: DC
  Administered 2024-01-15 – 2024-01-20 (×15): 300 mg via ORAL
  Filled 2024-01-15 (×15): qty 1

## 2024-01-15 MED ORDER — HYDRALAZINE HCL 20 MG/ML IJ SOLN: 10.0000 mg | Freq: Four times a day (QID) | INTRAMUSCULAR | Status: DC | PRN

## 2024-01-15 NOTE — Progress Notes (Signed)
 Physical Therapy Treatment Patient Details Name: [name] MRN: [id number] DOB: 16-Sep-1956 Today's Date: 01/15/2024   History of Present Illness 31 yoF w/ PMH presented 3/26 with AMS.  Witnessed seizure x 2 with EMS s/p versed.  Pt intubated 3/26 for airway protection and O2 desaturations. CT head: negative. Acute hypoxic respiratory insufficiency related to presumed aspiration RLL PNA/ pneumonitis +/- atelectasis with RLL collapse, s/p bronchoscopy 3/26. Extubated 3/28. PMH: Seizure disorder, ETOH withdrawal, pancreatitis, anxiety, depression and SAH.    PT Comments  Pt agreeable to treatment today after extubation yesterday afternoon. Pt continues to demo delay in processing and command following, needing sequential cues for LE movement and stepping when standing. Pt needing modA to manage balance and wt shift to allow for stepping at this time. Mobility progression limited by BP elevation, and therefore pt returned to supine and RN alerted. Will continue to benefit from skilled PT acutely and intensive therapies when medically stable to facilitate return to maximal level of independence.     If plan is discharge home, recommend the following: Two people to help with walking and/or transfers;Two people to help with bathing/dressing/bathroom;Assistance with cooking/housework;Assistance with feeding;Direct supervision/assist for medications management;Direct supervision/assist for financial management;Assist for transportation;Supervision due to cognitive status   Can travel by private vehicle        Equipment Recommendations  Other (comment) (TBD)    Recommendations for Other Services       Precautions / Restrictions Precautions Precautions: Fall;Other (comment) Recall of Precautions/Restrictions: Impaired Precaution/Restrictions Comments: HTN Restrictions Weight Bearing Restrictions Per Provider Order: No     Mobility  Bed Mobility Overal bed mobility: Needs  Assistance Bed Mobility: Supine to Sit, Sit to Supine     Supine to sit: Mod assist, HOB elevated, Used rails Sit to supine: Max assist   General bed mobility comments: pt needing cues and assist to complete transition to sitting EOB, sequential cues with assist to move LE to EOB then to pivot hips and scoot    Transfers Overall transfer level: Needs assistance Equipment used: Rolling walker (2 wheels) Transfers: Sit to/from Stand Sit to Stand: Mod assist           General transfer comment: modA to rise to standing and steady, no overt buckling. limited by impaired command following, difficult to return to bed due to bed height and pt short stature    Ambulation/Gait Ambulation/Gait assistance: Mod assist Gait Distance (Feet): 2 Feet Assistive device: Rolling walker (2 wheels) Gait Pattern/deviations: Step-to pattern Gait velocity: decreased Gait velocity interpretation: <1.31 ft/sec, indicative of household ambulator   General Gait Details: small lateral steps with max cues and facilitation of wt shift, limited by pt fatigue and BP elevation     Balance Overall balance assessment: Needs assistance Sitting-balance support: No upper extremity supported, Feet unsupported (feet not supported due to pt's short stature in a ICU bed) Sitting balance-Leahy Scale: Fair Sitting balance - Comments: no assist to maintain statically   Standing balance support: Bilateral upper extremity supported, During functional activity Standing balance-Leahy Scale: Poor Standing balance comment: dependent on BUE support                            Communication Communication Communication: No apparent difficulties  Cognition Arousal: Alert Behavior During Therapy: Flat affect   PT - Cognitive impairments: Orientation, Memory, Attention, Sequencing, Problem solving, Safety/Judgement   Orientation impairments: Place, Time  PT - Cognition Comments: pt  knew she had seizure and her name, knew month and day of week but not date or location. unable to recall location or that her husband had been visiting just prior tosession Following commands: Impaired Following commands impaired: Follows one step commands inconsistently, Follows one step commands with increased time    Cueing Cueing Techniques: Verbal cues, Gestural cues, Tactile cues, Visual cues  Exercises Other Exercises Other Exercises: standing marches x5    General Comments General comments (skin integrity, edema, etc.): BP elevated with mobility, returned to supine and RN alerted 198/118      Pertinent Vitals/Pain Pain Assessment Pain Assessment: 0-10 Pain Score: 4  Faces Pain Scale: Hurts little more Pain Location: neck Pain Descriptors / Indicators: Grimacing Pain Intervention(s): Limited activity within patient's tolerance, Monitored during session, Heat applied     PT Goals (current goals can now be found in the care plan section) Acute Rehab PT Goals Patient Stated Goal: pt reports to feel better PT Goal Formulation: With patient/family Time For Goal Achievement: 01/28/24 Potential to Achieve Goals: Good Progress towards PT goals: Progressing toward goals    Frequency    Min 3X/week       AM-PAC PT "6 Clicks" Mobility   Outcome Measure  Help needed turning from your back to your side while in a flat bed without using bedrails?: Total Help needed moving from lying on your back to sitting on the side of a flat bed without using bedrails?: Total Help needed moving to and from a bed to a chair (including a wheelchair)?: Total Help needed standing up from a chair using your arms (e.g., wheelchair or bedside chair)?: Total Help needed to walk in hospital room?: Total Help needed climbing 3-5 steps with a railing? : Total 6 Click Score: 6    End of Session Equipment Utilized During Treatment: Gait belt Activity Tolerance: Patient tolerated treatment  well;Other (comment) (BP elevation) Patient left: in bed;with bed alarm set;with call bell/phone within reach;with nursing/sitter in room Nurse Communication: Mobility status PT Visit Diagnosis: Unsteadiness on feet (R26.81);Muscle weakness (generalized) (M62.81);Difficulty in walking, not elsewhere classified (R26.2);Other symptoms and signs involving the nervous system (R29.898)     Time: 1610-9604 PT Time Calculation (min) (ACUTE ONLY): 22 min  Charges:    $Therapeutic Activity: 8-22 mins PT General Charges $$ ACUTE PT VISIT: 1 Visit                     Vickki Muff, PT, DPT   Acute Rehabilitation Department Office [phone] Secure Chat Communication Preferred   Ronnie Derby 01/15/2024, 12:04 PM

## 2024-01-15 NOTE — Progress Notes (Signed)
 Per rad tech, MBS ordered for this patient however per RN, no MBS needed and patient has passed the [facility] swallow screen. Order being cancelled.   Lydiann Bonifas MA, CCC-SLP

## 2024-01-15 NOTE — Progress Notes (Signed)
 Date and time results received: 01/15/24 7:54 AM  Test: Potassium Critical Value: 2.6  Name of Provider Notified: Dr. Everardo All, CCM Team  Orders Received? Or Actions Taken?: See New Orders  Mammie Russian, RN

## 2024-01-15 NOTE — Progress Notes (Signed)
 NAME:  [name], MRN:  [id number], DOB:  16-Dec-1955, LOS: 3 ADMISSION DATE:  01/12/2024, CONSULTATION DATE:  01/12/24 REFERRING MD:  Dr. Lynelle Doctor, CHIEF COMPLAINT:  AMS   History of Present Illness:  Pt encephalopathic, HPI obtained from EMR  55 yoF w/ PMH significant for seizures with med non-compliance, ETOH abuse, anxiety/ depression presented by EMS for AMS.  Witnessed seizure x 2 with EMS s/p versed.  On arrival to ER, no further seizure activity but required intubation for airway protection and O2 desaturations.  CTH neg.  Loaded with keppra and vimat started.  Sedated on fentanyl/ versed gtt.  Neurology consulted.  Labs show AKI, ETOH neg, K 2.7.  PCCM to admit.   Pertinent  Medical History   Past Medical History:  Diagnosis Date   Alcohol dependence (HCC)    Anxiety    Chronic pain    Depression    Diverticulitis    Herniated cervical disc    Pancreatitis    Seizures (HCC)    alcoholic seizures   Significant Hospital Events: Including procedures, antibiotic start and stop dates in addition to other pertinent events   3/26 admit, intubated. Bronchoscopy for RLL collapse 3/28 Extubated  Interim History / Subjective:  Extubated yesterday Objective   Blood pressure (!) 154/74, pulse 78, temperature 98.7 F (37.1 C), temperature source Oral, resp. rate 18, height 5\' 1"  (1.549 m), weight 71.6 kg, SpO2 95%.    Vent Mode: PSV;CPAP FiO2 (%):  [40 %] 40 % Set Rate:  [18 bmp] 18 bmp Vt Set:  [380 mL] 380 mL PEEP:  [8 cmH20] 8 cmH20 Pressure Support:  [12 cmH20] 12 cmH20   Intake/Output Summary (Last 24 hours) at 01/15/2024 0833 Last data filed at 01/15/2024 0700 Gross per 24 hour  Intake 1711.51 ml  Output 965 ml  Net 746.51 ml   Filed Weights   01/12/24 0756 01/13/24 0400 01/14/24 0600  Weight: 64 kg 68.5 kg 71.6 kg   Physical Exam: General: Chronically ill-appearing, no acute distress HENT: [place], AT, OP clear, MMM Eyes: EOMI, no scleral  icterus Respiratory: Clear to auscultation bilaterally.  No crackles, wheezing or rales Cardiovascular: RRR, -M/R/G, no JVD GI: BS+, soft, nontender Extremities:-Edema,-tenderness Neuro: Awake, follows commands, moves extremities x 4, CNII-XII grossly intact  Imaging, labs and test in EMR in the last 24 hours reviewed independently by me. Pertinent findings below:  CXR with some improvement in RLL atelectasis K 2.6 Mg 1.6   Resolved Hospital Problem list   AKI Assessment & Plan:   Acute encephalopathy, presumed status epilepticus - improving Hx seizures with med non compliance  Hx ETOH abuse, unclear if current/ prior use, neg on admit P:  - Neurology following - AEDs per Neurology> home doses of keppra, vimpat gabapentin  - cEEG discontinued - neuro watch/ seizure precautions - cont supportive care - empiric thiamine/ folate, monitor for possible withdrawal  Acute hypoxic respiratory insufficiency related to above Presumed aspiration RLL PNA/ pneumonitis +/- atelectasis with RLL collapse S/p bronchoscopy 3/26 P:  - Intubated 3/26-3/28. Currently on room air - prn albuterol - empiric Unasyn coverage Day 4  Hypokalemia - Replete - Trend  Hypoglycemia level 1 - Failed swallow - D5 gtt   Best Practice (right click and "Reselect all SmartList Selections" daily)   Diet/type: NPO DVT prophylaxis LMWH Pressure ulcer(s): N/A GI prophylaxis: H2B Lines: N/A Foley:  Yes, and it is still needed Code Status:  full code Last date of multidisciplinary goals of care discussion [  pending] Discussed with spouse at bedside 3/27. Remain full code but would not want long term life support if she condition untreatable  NOK> daughter's number invalid, SO, [name] [phone] no answer/ VM, other number not in service  Critical care time: N/A   Care Time: 50 min Transfer to [facility] and floor  Mechele Collin, M.D. [facility] Pulmonary/Critical Care Medicine 01/15/2024 8:33 AM    Please see Amion for pager number to reach on-call Pulmonary and Critical Care Team.

## 2024-01-15 NOTE — Evaluation (Signed)
 Speech Language Pathology Evaluation Patient Details Name: [name] MRN: [id number] DOB: 1956-05-15 Today's Date: 01/15/2024 Time: 7829-5621 SLP Time Calculation (min) (ACUTE ONLY): 24 min  Problem List:  Patient Active Problem List   Diagnosis Date Noted   Recurrent seizures (HCC) 08/18/2023   History of seizure 05/05/2023   GAD (generalized anxiety disorder) 05/05/2023   Seizure-like activity (HCC) 12/18/2022   Seizure (HCC) 12/18/2022   Hyperkalemia 10/19/2022   Acute metabolic encephalopathy 10/18/2022   Seizures (HCC) 10/17/2022   Chronic pain    Hypoalbuminemia    Hyperglycemia    Nonadherence to medication    Seizure (HCC) 05/17/2022   Status epilepticus (HCC)    Hypokalemia    Alcohol-induced mood disorder (HCC)    Closed displaced oblique fracture of shaft of left humerus 08/01/2020   GERD (gastroesophageal reflux disease) 02/28/2020   Alcohol withdrawal related seizure (HCC) 12/13/2018   Thrombocytopenia (HCC) 12/13/2018   Major depressive disorder 12/13/2018   Neuropathy 12/13/2018   Subarachnoid hemorrhage (HCC) 12/11/2018   Seizure due to alcohol withdrawal (HCC) 12/11/2018   Alcohol dependence with alcohol-induced mood disorder (HCC)    Major depressive disorder, 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   Alcohol use disorder, severe, dependence (HCC) 05/16/2018   Past Medical History:  Past Medical History:  Diagnosis Date   Alcohol dependence (HCC)    Anxiety    Chronic pain    Depression    Diverticulitis    Herniated cervical disc    Pancreatitis    Seizures (HCC)    alcoholic seizures   Past Surgical History:  Past Surgical History:  Procedure Laterality Date   ABDOMINAL HYSTERECTOMY     ANKLE SURGERY Right    CARPAL TUNNEL RELEASE Bilateral    CERVICAL FUSION     CHOLECYSTECTOMY     KNEE SURGERY     HPI:  32 yoF w/ PMH presented 3/26 with AMS. Witnessed seizure x 2 with EMS s/p versed. Pt intubated 3/26  for airway protection and O2 desaturations. CT head: negative. Acute hypoxic respiratory insufficiency related to presumed aspiration RLL PNA/ pneumonitis +/- atelectasis with RLL collapse, s/p bronchoscopy 3/26. Extubated 3/28. PMH: Seizure disorder, ETOH withdrawal, pancreatitis, anxiety, depression and SAH; ST consulted for speech/language cognitive evaluation.   Assessment / Plan / Recommendation Clinical Impression  Pt seen for speech/language cognitive assssment with pt exhibiting intelligible speech, but disjointed, distracted responses with extended time to process and respond to questions/various tasks given during evaluation.  Pt oriented to self and situation, but not time/place without cueing provided.  Decreased sustained attention, memory recall and anticipatory/intellectual awareness observed throughout assessment with min-mod verbal/visual cueing required to maintain attention.  Difficult to determine baseline functioning d/t no family present and accuracy of pt responses difficult to decipher d/t postictal state.  ST will f/u for above mentioned deficits in acute setting and determination of prior functional status.  Thank you for this consult.    SLP Assessment  SLP Recommendation/Assessment: Patient needs continued Speech Language Pathology Services SLP Visit Diagnosis: Attention and concentration deficit;Cognitive communication deficit (R41.841)    Recommendations for follow up therapy are one component of a multi-disciplinary discharge planning process, led by the attending physician.  Recommendations may be updated based on patient status, additional functional criteria and insurance authorization.    Follow Up Recommendations  Other (comment) (TBD)    Assistance Recommended at Discharge  Frequent or constant Supervision/Assistance  Functional Status Assessment Patient has had a recent decline in their functional status  and demonstrates the ability to make significant  improvements in function in a reasonable and predictable amount of time.  Frequency and Duration min 2x/week  1 week      SLP Evaluation Cognition  Overall Cognitive Status: No family/caregiver present to determine baseline cognitive functioning Arousal/Alertness: Awake/alert Orientation Level: Oriented to person;Oriented to situation;Disoriented to place;Disoriented to time Day of Week: Correct Attention: Sustained Sustained Attention: Impaired Sustained Attention Impairment: Verbal basic;Functional basic Memory: Impaired Memory Impairment: Decreased short term memory;Decreased recall of new information;Retrieval deficit;Other (comment) Decreased Short Term Memory: Verbal basic;Functional basic Awareness: Impaired Awareness Impairment: Intellectual impairment Behaviors: Perseveration Comments: Cognition DTA       Comprehension  Auditory Comprehension Overall Auditory Comprehension: Impaired Commands: Impaired Multistep Basic Commands: 25-49% accurate Conversation: Simple Interfering Components: Attention;Processing speed;Working Radio broadcast assistant: Repetition Counsellor: Not tested Reading Comprehension Reading Status: Not tested    Expression Expression Primary Mode of Expression: Verbal Verbal Expression Overall Verbal Expression: Impaired Level of Generative/Spontaneous Verbalization: Conversation Naming: Impairment Responsive: Not tested Confrontation: Not tested Divergent: 25-49% accurate Verbal Errors: Perseveration Pragmatics: Unable to assess Interfering Components: Attention;Other (comment) (postictal state impacting) Non-Verbal Means of Communication: Not applicable Written Expression Dominant Hand: Right Written Expression: Not tested   Oral / Motor  Oral Motor/Sensory Function Overall Oral Motor/Sensory Function: Within functional limits Motor Speech Overall Motor Speech: Appears within functional  limits for tasks assessed Respiration: Within functional limits Phonation: Normal Resonance: Within functional limits Articulation: Within functional limitis Intelligibility: Intelligible Motor Planning: Witnin functional limits Motor Speech Errors: Not applicable            Pat Gioia Ranes,M.S.,CCC-SLP 01/15/2024, 3:59 PM

## 2024-01-15 NOTE — Progress Notes (Signed)
 Pt with via external catheter throughout shift.  Bladder scan revealed >500 and pt c/o fullness in bladder.  In/out cath performed produced .

## 2024-01-16 DIAGNOSIS — G40901 Epilepsy, unspecified, not intractable, with status epilepticus: Secondary | ICD-10-CM | POA: Diagnosis not present

## 2024-01-16 LAB — GLUCOSE, CAPILLARY
Glucose-Capillary: 131 mg/dL — ABNORMAL HIGH (ref 70–99)
Glucose-Capillary: 112 mg/dL — ABNORMAL HIGH (ref 70–99)
Glucose-Capillary: 211 mg/dL — ABNORMAL HIGH (ref 70–99)
Glucose-Capillary: 86 mg/dL (ref 70–99)
Glucose-Capillary: 139 mg/dL — ABNORMAL HIGH (ref 70–99)

## 2024-01-16 LAB — CBC
HCT: 42.4 % (ref 36.0–46.0)
Hemoglobin: 14.8 g/dL (ref 12.0–15.0)
MCH: 34.5 pg — ABNORMAL HIGH (ref 26.0–34.0)
MCHC: 34.9 g/dL (ref 30.0–36.0)
MCV: 98.8 fL (ref 80.0–100.0)
Platelets: 396 10*3/uL (ref 150–400)
RBC: 4.29 MIL/uL (ref 3.87–5.11)
RDW: 14.8 % (ref 11.5–15.5)
WBC: 8.4 10*3/uL (ref 4.0–10.5)
nRBC: 0 % (ref 0.0–0.2)

## 2024-01-16 LAB — COMPREHENSIVE METABOLIC PANEL WITH GFR
ALT: 16 U/L (ref 0–44)
AST: 29 U/L (ref 15–41)
Albumin: 3.1 g/dL — ABNORMAL LOW (ref 3.5–5.0)
Alkaline Phosphatase: 64 U/L (ref 38–126)
Anion gap: 11 (ref 5–15)
BUN: <5 mg/dL — ABNORMAL LOW (ref 8–23)
CO2: 24 mmol/L (ref 22–32)
Calcium: 9.2 mg/dL (ref 8.9–10.3)
Chloride: 100 mmol/L (ref 98–111)
Creatinine, Ser: 0.45 mg/dL (ref 0.44–1.00)
GFR, Estimated: >60 mL/min (ref >60)
Glucose, Bld: 97 mg/dL (ref 70–99)
Potassium: 3 mmol/L — ABNORMAL LOW (ref 3.5–5.1)
Sodium: 135 mmol/L (ref 135–145)
Total Bilirubin: 0.7 mg/dL (ref 0.0–1.2)
Total Protein: 6.7 g/dL (ref 6.5–8.1)

## 2024-01-16 LAB — MAGNESIUM: Magnesium: 1.7 mg/dL (ref 1.7–2.4)

## 2024-01-16 MED ORDER — HALOPERIDOL LACTATE 5 MG/ML IJ SOLN: 5.0000 mg | Freq: Four times a day (QID) | INTRAMUSCULAR | Status: DC | PRN

## 2024-01-16 MED ORDER — MELATONIN 5 MG PO TABS
5.0000 mg | ORAL_TABLET | Freq: Every evening | ORAL | Status: DC | PRN
  Administered 2024-01-16 – 2024-01-17 (×3): 5 mg via ORAL
  Filled 2024-01-16 (×3): qty 1

## 2024-01-16 MED ORDER — NICOTINE 21 MG/24HR TD PT24
21.0000 mg | MEDICATED_PATCH | Freq: Every day | TRANSDERMAL | Status: DC
  Administered 2024-01-16 – 2024-01-20 (×5): 21 mg via TRANSDERMAL
  Filled 2024-01-16 (×5): qty 1

## 2024-01-16 MED ORDER — ENSURE ENLIVE PO LIQD
237.0000 mL | Freq: Two times a day (BID) | ORAL | Status: DC
  Administered 2024-01-16 – 2024-01-19 (×8): 237 mL via ORAL

## 2024-01-16 MED ORDER — LEVETIRACETAM 500 MG PO TABS
500.0000 mg | ORAL_TABLET | Freq: Two times a day (BID) | ORAL | Status: DC
Start: 2024-01-16 — End: 2024-01-20
  Administered 2024-01-16 – 2024-01-20 (×9): 500 mg via ORAL
  Filled 2024-01-16 (×9): qty 1

## 2024-01-16 MED ORDER — POTASSIUM CHLORIDE 20 MEQ PO PACK
20.0000 meq | PACK | ORAL | Status: AC
Start: 2024-01-16 — End: 2024-01-16
  Administered 2024-01-16 (×3): 20 meq via ORAL
  Filled 2024-01-16 (×3): qty 1

## 2024-01-16 MED ORDER — AMOXICILLIN-POT CLAVULANATE 875-125 MG PO TABS
1.0000 | ORAL_TABLET | Freq: Two times a day (BID) | ORAL | Status: AC
  Administered 2024-01-16 – 2024-01-18 (×6): 1 via ORAL
  Filled 2024-01-16 (×6): qty 1

## 2024-01-16 MED ORDER — ACETAMINOPHEN 325 MG PO TABS
650.0000 mg | ORAL_TABLET | ORAL | Status: DC | PRN
  Administered 2024-01-16 – 2024-01-18 (×6): 650 mg via ORAL
  Filled 2024-01-16 (×6): qty 2

## 2024-01-16 MED ORDER — HALOPERIDOL LACTATE 5 MG/ML IJ SOLN
1.0000 mg | Freq: Four times a day (QID) | INTRAMUSCULAR | Status: DC | PRN
Start: 2024-01-16 — End: 2024-01-20

## 2024-01-16 MED ORDER — LACOSAMIDE 50 MG PO TABS
50.0000 mg | ORAL_TABLET | Freq: Two times a day (BID) | ORAL | Status: DC
  Administered 2024-01-16 – 2024-01-20 (×9): 50 mg via ORAL
  Filled 2024-01-16 (×9): qty 1

## 2024-01-16 MED ORDER — HALOPERIDOL 0.5 MG PO TABS
1.0000 mg | ORAL_TABLET | Freq: Four times a day (QID) | ORAL | Status: DC | PRN
  Administered 2024-01-16 – 2024-01-18 (×4): 1 mg via ORAL
  Filled 2024-01-16 (×4): qty 2

## 2024-01-16 MED ORDER — POTASSIUM CHLORIDE CRYS ER 20 MEQ PO TBCR
40.0000 meq | EXTENDED_RELEASE_TABLET | ORAL | Status: AC
  Administered 2024-01-16 (×2): 40 meq via ORAL
  Filled 2024-01-16 (×2): qty 2

## 2024-01-16 MED ORDER — ONDANSETRON 4 MG PO TBDP
4.0000 mg | ORAL_TABLET | Freq: Four times a day (QID) | ORAL | Status: DC | PRN
Start: 2024-01-16 — End: 2024-01-20
  Administered 2024-01-16: 4 mg via ORAL
  Filled 2024-01-16: qty 1

## 2024-01-16 NOTE — Plan of Care (Signed)

## 2024-01-16 NOTE — Progress Notes (Signed)
 eLink Physician-Brief Progress Note Patient Name: [name] DOB: December 29, 1955 MRN: [id number]   Date of Service  01/16/2024  HPI/Events of Note  Hypokalemia.  Patient also requesting something to make her sleep.  eICU Interventions  Potassium chloride replacement ordered. Melatonin ordered for sleep.     Intervention Category Minor Interventions: Electrolytes abnormality - evaluation and management;Routine modifications to care plan (e.g. PRN medications for pain, fever)  Carilyn Goodpasture 01/16/2024, 3:14 AM

## 2024-01-16 NOTE — Progress Notes (Signed)

## 2024-01-16 NOTE — Progress Notes (Signed)
 PROGRESS NOTE    [name]  ZOX:[id number] DOB: [birth year] DOA: 01/12/2024 PCP: Diamantina Providence, FNP   Brief Narrative:  4 yoF w/ PMH significant for seizures with med non-compliance, ETOH abuse, anxiety/ depression presented by EMS for AMS. Witnessed seizure x 2 with EMS s/p versed. On arrival to ER, no further seizure activity but required intubation for airway protection and O2 desaturations. CTH neg. Loaded with keppra and vimat started. Sedated on fentanyl/ versed gtt. Neurology consulted. Labs show AKI, ETOH neg, K 2.7.  Admitted under PCCM and eventually extubated on 01/14/2024.  Transferred under [facility] 01/06/2024.  Assessment & Plan:   Principal Problem:   Status epilepticus (HCC)  History of seizure and noncompliance to the medications presented with acute encephalopathy secondary to presumed status epilepticus: Improved.  Antiseizure medications per neurology.  Currently on Keppra and Vimpat.   Acute hypoxic respiratory failure secondary to presumed right lower lobe aspiration pneumonia and pneumonitis plus atelectasis: Intubated 3/ 26 and extubated 01/14/2024. S/p bronchoscopy 3/26.  Has been on Unasyn and now on room air.   Hypokalemia: Rechecking labs today. Addendum, low again, will replenish.   Hypoglycemia secondary to poor p.o. intake due to dysphagia: SLP following, plan was to do MBS but patient passed swallow evaluation so MBS canceled.  Currently on full liquid diet.  Blood sugar within normal range.  Hypertension: Fairly controlled.  Continue amlodipine.  Agitation/paranoia: When I entered the room, nurse and nurse were at the bedside and noted that patient was trying to be belligerent, agitated and tried to kick the staff.  She was already in hand mittens when I entered.  Talking to patient, she answered all the orientation questions appropriately so she was fully oriented however she did exhibit signs of paranoia.  I asked her why she was taking the  staff, she said " because they are trying to hurt me".  I tried to calm her down and informed her that the staff is eventually trying to help her and she is in the hospital.  Then she said " this is the best place to get hurt".  Staff has been advised to keep close monitoring for now.  Low threshold to consult psychiatry.  Disposition: Seen by PT OT, recommended CIR.  DVT prophylaxis: enoxaparin (LOVENOX) injection 40 mg Start: 01/12/24 1100 SCDs Start: 01/12/24 1043   Code Status: Full Code  Family Communication:  None present at bedside.  Plan of care discussed with patient in length and he/she verbalized understanding and agreed with it.  Status is: Inpatient Remains inpatient appropriate because: Patient agitated.   Estimated body mass index is 29.83 kg/m as calculated from the following:   Height as of this encounter: 5\' 1"  (1.549 m).   Weight as of this encounter: 71.6 kg.    Nutritional Assessment: Body mass index is 29.83 kg/m.Marland Kitchen Seen by dietician.  I agree with the assessment and plan as outlined below: Nutrition Status: Nutrition Problem: Inadequate oral intake Etiology: inability to eat Signs/Symptoms: NPO status Interventions: Refer to RD note for recommendations  . Skin Assessment: I have examined the patient's skin and I agree with the wound assessment as performed by the wound care RN as outlined below:    Consultants:  Neurology  Procedures:  As above  Antimicrobials:  Anti-infectives (From admission, onward)    Start     Dose/Rate Route Frequency Ordered Stop   01/12/24 1130  Ampicillin-Sulbactam (UNASYN) 3 g in sodium chloride 0.9 % 100 mL IVPB  3 g 200 mL/hr over 30 Minutes Intravenous Every 6 hours 01/12/24 1134 01/16/24 2359         Subjective: Patient seen and examined, she was in hand mittens and slightly agitated but fully oriented and alert.  Objective: Vitals:   01/15/24 1959 01/15/24 2011 01/15/24 2319 01/16/24 0330  BP: (!)  148/81 (!) 148/86 (!) 135/90 (!) 142/87  Pulse: (!) 101 100 93 (!) 105  Resp: 18  18 18   Temp: 98.8 F (37.1 C) 98.8 F (37.1 C) 98.1 F (36.7 C) 98.8 F (37.1 C)  TempSrc: Oral Oral Oral Oral  SpO2: 94% 96% 97% 97%  Weight:      Height:        Intake/Output Summary (Last 24 hours) at 01/16/2024 0807 Last data filed at 01/16/2024 0548 Gross per 24 hour  Intake 500.99 ml  Output 1350 ml  Net -849.01 ml   Filed Weights   01/12/24 0756 01/13/24 0400 01/14/24 0600  Weight: 64 kg 68.5 kg 71.6 kg    Examination:  General exam: Appears calm and comfortable  Respiratory system: Clear to auscultation. Respiratory effort normal. Cardiovascular system: S1 & S2 heard, RRR. No JVD, murmurs, rubs, gallops or clicks. No pedal edema. Gastrointestinal system: Abdomen is nondistended, soft and nontender. No organomegaly or masses felt. Normal bowel sounds heard. Central nervous system: Alert and oriented. No focal neurological deficits. Extremities: Symmetric 5 x 5 power. Skin: No rashes, lesions or ulcers Psychiatry: Judgement and insight appear poor  Data Reviewed: I have personally reviewed following labs and imaging studies  CBC: Recent Labs  Lab 01/12/24 0759 01/12/24 0814 01/12/24 1050 01/13/24 0654 01/13/24 2359 01/15/24 0625  WBC 9.4  --   --  7.7 6.7 6.7  NEUTROABS 8.4*  --   --   --   --   --   HGB 14.2 15.3* 12.9 13.1 12.9 13.0  HCT 44.0 45.0 38.0 40.2 38.8 39.4  MCV 106.8*  --   --  103.3* 103.5* 105.3*  PLT 274  --   --  233 253 287   Basic Metabolic Panel: Recent Labs  Lab 01/12/24 1812 01/13/24 0654 01/13/24 2359 01/14/24 0902 01/15/24 0625 01/15/24 2052 01/15/24 2302  NA  --  140 141 140 141 137 137  K  --  3.0* 2.9* 4.2 2.6* 2.8* 3.0*  CL  --  108 108 111 107 100 102  CO2  --  18* 23 18* 19* 25 22  GLUCOSE  --  85 83 95 93 125* 102*  BUN  --  6* 5* 5* <5* <5* <5*  CREATININE 0.61 0.59 0.60 0.58 0.61 0.58 0.52  CALCIUM  --  8.0* 7.7* 7.7* 7.9* 8.2*  8.3*  MG 1.4*  --  2.0  --  1.6*  --   --   PHOS  --  2.3*  --   --   --   --   --    GFR: Estimated Creatinine Clearance: 60.9 mL/min (by C-G formula based on SCr of 0.52 mg/dL). Liver Function Tests: Recent Labs  Lab 01/12/24 0759 01/13/24 2359  AST 40 28  ALT 14 13  ALKPHOS 63 51  BILITOT 0.5 1.0  PROT 7.0 5.2*  ALBUMIN 3.9 2.6*   No results for input(s): "LIPASE", "AMYLASE" in the last 168 hours. Recent Labs  Lab 01/12/24 1812  AMMONIA 29   Coagulation Profile: No results for input(s): "INR", "PROTIME" in the last 168 hours. Cardiac Enzymes: No results for input(s): "CKTOTAL", "  CKMB", "CKMBINDEX", "TROPONINI" in the last 168 hours. BNP (last 3 results) No results for input(s): "PROBNP" in the last 8760 hours. HbA1C: No results for input(s): "HGBA1C" in the last 72 hours. CBG: Recent Labs  Lab 01/15/24 1838 01/15/24 1934 01/15/24 2332 01/16/24 0332 01/16/24 0620  GLUCAP 144* 127* 101* 131* 112*   Lipid Profile: No results for input(s): "CHOL", "HDL", "LDLCALC", "TRIG", "CHOLHDL", "LDLDIRECT" in the last 72 hours. Thyroid Function Tests: No results for input(s): "TSH", "T4TOTAL", "FREET4", "T3FREE", "THYROIDAB" in the last 72 hours. Anemia Panel: No results for input(s): "VITAMINB12", "FOLATE", "FERRITIN", "TIBC", "IRON", "RETICCTPCT" in the last 72 hours. Sepsis Labs: No results for input(s): "PROCALCITON", "LATICACIDVEN" in the last 168 hours.  Recent Results (from the past 240 hours)  MRSA Next Gen by PCR, Nasal     Status: None   Collection Time: 01/12/24  4:02 PM   Specimen: Nasal Mucosa; Nasal Swab  Result Value Ref Range Status   MRSA by PCR Next Gen NOT DETECTED NOT DETECTED Final    Comment: (NOTE) The GeneXpert MRSA Assay (FDA approved for NASAL specimens only), is one component of a comprehensive MRSA colonization surveillance program. It is not intended to diagnose MRSA infection nor to guide or monitor treatment for MRSA infections. Test  performance is not FDA approved in patients less than 22 years old. Performed at [facility], [address] [place], [place] [address]      Radiology Studies: No results found.  Scheduled Meds:  amLODipine  10 mg Oral Daily   enoxaparin (LOVENOX) injection  40 mg Subcutaneous Q24H   famotidine  20 mg Oral BID   folic acid  1 mg Oral Daily   gabapentin  300 mg Oral TID   insulin aspart  0-9 Units Subcutaneous TID AC & HS   polyethylene glycol  17 g Oral Daily   thiamine  100 mg Oral Daily   Continuous Infusions:  ampicillin-sulbactam (UNASYN) IV 3 g (01/16/24 0548)   lacosamide (VIMPAT) IV 50 mg (01/15/24 2224)   levETIRAcetam 500 mg (01/15/24 2126)     LOS: 4 days   Hughie Closs, MD [facility]  01/16/2024, 8:07 AM   *Please note that this is a verbal dictation therefore any spelling or grammatical errors are due to the "Dragon Medical One" system interpretation.  Please page via Amion and do not message via secure chat for urgent patient care matters. Secure chat can be used for non urgent patient care matters.  How to contact the [facility] Attending or Consulting provider 7A - 7P or covering provider during after hours 7P -7A, for this patient?  Check the care team in [facility] and look for a) attending/consulting [facility] provider listed and b) the [facility] team listed. Page or secure chat 7A-7P. Log into [email or web address] and use [place]'s universal password to access. If you do not have the password, please contact the hospital operator. Locate the [facility] provider you are looking for under [facility] and page to a number that you can be directly reached. If you still have difficulty reaching the provider, please page the [facility] (Director on Call) for the Hospitalists listed on amion for assistance.

## 2024-01-16 NOTE — Plan of Care (Signed)
 In bed pulling off monitor and purwick. Trying to get OOB. Boyfriend at bedside.  Reorientation and explaining all task to her. Medication as ordered. MD aware of sinus tach. Dislodge IV line and IV team not able to get another line placed. IV meds changed to PO at this time. Mitts on hands.    Problem: Education: Goal: Knowledge of General Education information will improve Description: Including pain rating scale, medication(s)/side effects and non-pharmacologic comfort measures Outcome: Progressing   Problem: Activity: Goal: Risk for activity intolerance will decrease Outcome: Progressing   Problem: Nutrition: Goal: Adequate nutrition will be maintained Outcome: Progressing   Problem: Coping: Goal: Level of anxiety will decrease Outcome: Progressing   Problem: Pain Managment: Goal: General experience of comfort will improve and/or be controlled Outcome: Progressing   Problem: Safety: Goal: Ability to remain free from injury will improve Outcome: Progressing   Problem: Skin Integrity: Goal: Risk for impaired skin integrity will decrease Outcome: Progressing

## 2024-01-17 DIAGNOSIS — G40901 Epilepsy, unspecified, not intractable, with status epilepticus: Secondary | ICD-10-CM | POA: Diagnosis not present

## 2024-01-17 LAB — BASIC METABOLIC PANEL WITH GFR
Anion gap: 18 — ABNORMAL HIGH (ref 5–15)
BUN: <5 mg/dL — ABNORMAL LOW (ref 8–23)
CO2: 18 mmol/L — ABNORMAL LOW (ref 22–32)
Calcium: 9.7 mg/dL (ref 8.9–10.3)
Chloride: 105 mmol/L (ref 98–111)
Creatinine, Ser: 0.52 mg/dL (ref 0.44–1.00)
GFR, Estimated: >60 mL/min (ref >60)
Glucose, Bld: 95 mg/dL (ref 70–99)
Potassium: 4.5 mmol/L (ref 3.5–5.1)
Sodium: 141 mmol/L (ref 135–145)

## 2024-01-17 LAB — GLUCOSE, CAPILLARY
Glucose-Capillary: 98 mg/dL (ref 70–99)
Glucose-Capillary: 102 mg/dL — ABNORMAL HIGH (ref 70–99)
Glucose-Capillary: 108 mg/dL — ABNORMAL HIGH (ref 70–99)
Glucose-Capillary: 117 mg/dL — ABNORMAL HIGH (ref 70–99)

## 2024-01-17 LAB — CBC
HCT: 45.4 % (ref 36.0–46.0)
Hemoglobin: 15.2 g/dL — ABNORMAL HIGH (ref 12.0–15.0)
MCH: 33.7 pg (ref 26.0–34.0)
MCHC: 33.5 g/dL (ref 30.0–36.0)
MCV: 100.7 fL — ABNORMAL HIGH (ref 80.0–100.0)
Platelets: 381 10*3/uL (ref 150–400)
RBC: 4.51 MIL/uL (ref 3.87–5.11)
RDW: 15.2 % (ref 11.5–15.5)
WBC: 5.5 10*3/uL (ref 4.0–10.5)
nRBC: 0 % (ref 0.0–0.2)

## 2024-01-17 MED ORDER — LIDOCAINE 5 % EX PTCH
1.0000 | MEDICATED_PATCH | CUTANEOUS | Status: DC
  Administered 2024-01-17 – 2024-01-19 (×3): 1 via TRANSDERMAL
  Filled 2024-01-17 (×3): qty 1

## 2024-01-17 MED ORDER — QUETIAPINE FUMARATE 25 MG PO TABS
25.0000 mg | ORAL_TABLET | ORAL | Status: DC
  Administered 2024-01-17 – 2024-01-18 (×2): 25 mg via ORAL
  Filled 2024-01-17 (×2): qty 1

## 2024-01-17 NOTE — TOC Progression Note (Signed)
 Transition of Care [facility]) - Progression Note    Patient Details  Name: [name] MRN: [id number] Date of Birth: [birth year]  Transition of Care [facility]) CM/SW Contact  Baldemar Lenis, [place] Phone Number: 01/17/2024, 3:36 PM  Clinical Narrative:   Patient well known to TOC. Not medically stable for discharge at this time, psych consult pending and pending workup for inpatient rehab. CSW to follow.    Expected Discharge Plan: IP Rehab Facility Barriers to Discharge: Continued Medical Work up, English as a second language teacher  Expected Discharge Plan and Services       Living arrangements for the past 2 months: Hotel/Motel                                       Social Determinants of Health (SDOH) Interventions SDOH Screenings   Food Insecurity: Patient Unable To Answer (01/12/2024)  Housing: Patient Unable To Answer (01/12/2024)  Transportation Needs: Patient Unable To Answer (01/12/2024)  Utilities: Patient Unable To Answer (01/12/2024)  Alcohol Screen: Medium Risk (12/18/2018)  Depression (PHQ2-9): Medium Risk (05/10/2021)  Financial Resource Strain: Medium Risk (09/27/2018)  Physical Activity: Unknown (09/27/2018)  Social Connections: Patient Unable To Answer (01/12/2024)  Stress: No Stress Concern Present (09/27/2018)  Tobacco Use: High Risk (01/12/2024)    Readmission Risk Interventions     No data to display

## 2024-01-17 NOTE — Progress Notes (Signed)
 Inpatient Rehab Admissions Coordinator:   Met with patient at bedside, she is confused. Significant other not in room, she is unable to provide any information. It is too early to tell if she is a good candidate as she has not done much with therapy yet. (2 feet mod A). We will continue to follow for progress with therapy and potential CIR admission.    Rehab Admissons Coordinator [place], , [place] [phone]

## 2024-01-17 NOTE — Progress Notes (Signed)
 Nutrition Follow Up  DOCUMENTATION CODES:  Not applicable  INTERVENTION:  Ensure Enlive po BID, each supplement provides 350 kcal and 20 grams of protein.  Diet advanced from full liquids to regular per MD approval  Continue daily thiamine 100mg  and folic acid supplementation  NUTRITION DIAGNOSIS:  Inadequate oral intake related to social / environmental circumstances (living in hotel, food accessibility issues) as evidenced by estimated needs, per patient/family report. New diagnosis  GOAL:  Patient will meet greater than or equal to 90% of their needs Progressing  MONITOR:  PO intake, Supplement acceptance  REASON FOR ASSESSMENT:  Ventilator    ASSESSMENT:  Pt with hx of seizures (non compliant with medications), anxiety, chronic pain, diverticulitis, pancreatitis, and EtOH abuse. Admitted by EMS for altered mentation following 2 witnessed seizures. Pt was sedated on versed and fentanyl.  3/26 admitted to ICU, intubated and sedated 3/27 sedation weaning with plan to extubate 3/28 3/28 extubated 3/29 SLP consult cleared for diet advancement to full liquids  Spoke with pt who was awake and alert. Boyfriend at bedside at time of assessment. Pt currently on full liquid diet, discussed diet advancement with MD, SLP, and RN and MD agreed to advance. Pt reports she has a fair appetite since transitioning off the vent and ate 100% of breakfast which consisted of coffee, juice, ice cream and grits, but pt is wanting diet to be advanced to provide more options. Pt reports no issues with n/v, diarrhea, or constipation. Pt reports she likes the Ensure. Discussed adequate intake to help pt meet calorie and protein needs while admitted to aid in recovery.  PTA, pt reports fair appetite, pt tried to eat 3x per day, but unable to report portions of food/intake. Pt reports using food stamps but reports she typically has enough to eat. Pt would try to eat a protein source paired with a carb and  some fruit/vegetables if she had storage for them. Pt reports living in a hotel currently with a small kitchenette. Pt reports worrying about living situation after discharge, discussed talking with social worker and case manager about discharge plans and resources available to her.   Pt does not report any recent weight loss. Nutrition focused physical exam re-conducted and shows mild depletions of upper body muscle.   Pt reports her mobility was fair PTA. She would use cane for assistance with balance but was moving independently.   Nutritionally Relevant Medications: Scheduled Meds: Pepcid Folic acid Miralax Thiamine  Continuous Infusions: Vimpat Keppra  Labs Reviewed: Potassium 4.5<--3.0 Magnesium 1.7<--1.6 CBG ranges from 98-211 mg/dL over the last 24 hours  NUTRITION - FOCUSED PHYSICAL EXAM: Flowsheet Row Most Recent Value  Orbital Region No depletion  Upper Arm Region No depletion  Thoracic and Lumbar Region No depletion  Buccal Region No depletion  Temple Region Mild depletion  Clavicle Bone Region Mild depletion  Clavicle and Acromion Bone Region Mild depletion  Scapular Bone Region Mild depletion  Dorsal Hand Mild depletion  Patellar Region No depletion  Anterior Thigh Region No depletion  Posterior Calf Region No depletion  Edema (RD Assessment) None  Hair Reviewed  [reports hair falling out when brushed, dry scalp]  Eyes Reviewed  [ring around cornea]  Mouth Reviewed  Skin Reviewed  [dry skin]  Nails Reviewed   Diet Order:   Diet Order             Diet regular Room service appropriate? Yes; Fluid consistency: Thin  Diet effective now  EDUCATION NEEDS:   Education needs have been addressed  Skin:  Skin Assessment: Reviewed RN Assessment  Last BM:  3/30 type 4  Height:  Ht Readings from Last 1 Encounters:  01/12/24 5\' 1"  (1.549 m)   Weight:  Wt Readings from Last 1 Encounters:  01/14/24 71.6 kg   Ideal Body Weight:  47.7  kg  BMI:  Body mass index is 29.83 kg/m.  Estimated Nutritional Needs:   Kcal:  1500-1700  Protein:  80-95g  Fluid:  1.4-1.6L  Louis Meckel Dietetic Intern

## 2024-01-17 NOTE — Progress Notes (Signed)
 Physical Therapy Treatment Patient Details Name: [name] MRN: [id number] DOB: 07/02/1956 Today's Date: 01/17/2024   History of Present Illness 79 yoF w/ PMH presented 3/26 with AMS.  Witnessed seizure x 2 with EMS s/p versed.  Pt intubated 3/26 for airway protection and O2 desaturations. CT head: negative. Acute hypoxic respiratory insufficiency related to presumed aspiration RLL PNA/ pneumonitis +/- atelectasis with RLL collapse, s/p bronchoscopy 3/26. Extubated 3/28. PMH: Seizure disorder, ETOH withdrawal, pancreatitis, anxiety, depression and SAH.    PT Comments  Patient resting in bed, sitter present. Pt alert and agreeable to therapy visit. Supervision for safety provided with bed mobility and cues for safety. Pt able to power up with use of UE's and min assist required to steady balance once standing. Patient initiated gait today with HHA and SPC, cues needed for sequencing and pt coordinated cane placement ~ 50% of the time. Pt unsteady with min assist required to prevent drift Rt/Lt. EOS pt returned to EOB and completed functional sit<>stands for LE strengthening, pt able to rise without UE use. EOS pt returned to supine. Alarm on and safety sitter present     If plan is discharge home, recommend the following: Two people to help with walking and/or transfers;Two people to help with bathing/dressing/bathroom;Assistance with cooking/housework;Assistance with feeding;Direct supervision/assist for medications management;Direct supervision/assist for financial management;Assist for transportation;Supervision due to cognitive status   Can travel by private vehicle        Equipment Recommendations  Other (comment)    Recommendations for Other Services Rehab consult     Precautions / Restrictions Precautions Precautions: Fall;Other (comment) Recall of Precautions/Restrictions: Impaired Precaution/Restrictions Comments: suicide precautions 3/31, psych eval  ordered Restrictions Weight Bearing Restrictions Per Provider Order: No     Mobility  Bed Mobility Overal bed mobility: Needs Assistance Bed Mobility: Supine to Sit, Sit to Supine     Supine to sit: Supervision Sit to supine: Supervision   General bed mobility comments: sup for safety, HOB flat.    Transfers Overall transfer level: Needs assistance Equipment used: 1 person hand held assist, None Transfers: Sit to/from Stand Sit to Stand: Min assist           General transfer comment: min assist to steady once standing, pt able to power up with CGA    Ambulation/Gait Ambulation/Gait assistance: Min assist Gait Distance (Feet): 70 Feet Assistive device: 1 person hand held assist, Straight cane Gait Pattern/deviations: Step-through pattern, Decreased stride length, Drifts right/left Gait velocity: decr     General Gait Details: min HHA to steady balance. pt drifting Rt/Lt, pt using SPC in Rt UE and sequencing cane placement appropriately ~50% of gait.   Stairs             Wheelchair Mobility     Tilt Bed    Modified Rankin (Stroke Patients Only)       Balance Overall balance assessment: Needs assistance Sitting-balance support: No upper extremity supported, Feet supported Sitting balance-Leahy Scale: Fair     Standing balance support: Single extremity supported, No upper extremity supported, During functional activity Standing balance-Leahy Scale: Poor Standing balance comment: reliant on min assist from therapist                            Communication Communication Communication: No apparent difficulties  Cognition Arousal: Alert Behavior During Therapy: Flat affect   PT - Cognitive impairments: Orientation, Memory, Attention, Sequencing, Problem solving, Safety/Judgement, Awareness   Orientation impairments:  Situation, Time, Place                   PT - Cognition Comments: pt knows she is at [facility]. pt verbalizing  visual hallucinations throughout session. required repeated cues to attend to task and maintain focus Following commands: Impaired Following commands impaired: Follows one step commands inconsistently, Follows one step commands with increased time    Cueing Cueing Techniques: Verbal cues, Gestural cues, Tactile cues, Visual cues  Exercises Other Exercises Other Exercises: 10x sit<>stand no UE use to power up    General Comments        Pertinent Vitals/Pain Pain Assessment Pain Assessment: No/denies pain    Home Living                          Prior Function            PT Goals (current goals can now be found in the care plan section) Acute Rehab PT Goals PT Goal Formulation: With patient/family Time For Goal Achievement: 01/28/24 Potential to Achieve Goals: Good Progress towards PT goals: Progressing toward goals    Frequency    Min 3X/week      PT Plan      Co-evaluation              AM-PAC PT "6 Clicks" Mobility   Outcome Measure  Help needed turning from your back to your side while in a flat bed without using bedrails?: A Little Help needed moving from lying on your back to sitting on the side of a flat bed without using bedrails?: A Little Help needed moving to and from a bed to a chair (including a wheelchair)?: A Little Help needed standing up from a chair using your arms (e.g., wheelchair or bedside chair)?: A Little Help needed to walk in hospital room?: A Little Help needed climbing 3-5 steps with a railing? : A Lot 6 Click Score: 17    End of Session Equipment Utilized During Treatment: Gait belt Activity Tolerance: Patient tolerated treatment well;Other (comment) Patient left: in bed;with bed alarm set;with call bell/phone within reach;with nursing/sitter in room Nurse Communication: Mobility status PT Visit Diagnosis: Unsteadiness on feet (R26.81);Muscle weakness (generalized) (M62.81);Difficulty in walking, not elsewhere  classified (R26.2);Other symptoms and signs involving the nervous system (R29.898)     Time: 1610-9604 PT Time Calculation (min) (ACUTE ONLY): 17 min  Charges:    $Gait Training: 8-22 mins PT General Charges $$ ACUTE PT VISIT: 1 Visit                     Wynn Maudlin, DPT Acute Rehabilitation Services Office [phone]  01/17/24 5:22 PM

## 2024-01-17 NOTE — Progress Notes (Signed)
 Pt is confused. Pt answered no to questions she had previously answered yes to.

## 2024-01-17 NOTE — Progress Notes (Addendum)
 Stated she is going to kill herself.  She does not have a plan but said she will do it if she has to stay in the hospital another night.  She said she just wants to go home and cannot spend another night here.  Said she will go crazy here if she cannot have a "joint to smoke."  She is confused and is using the TV remote talking to her son and thinks he is answering her inappropriately because he is making no sense.  I did tell her she is not on the phone but on the remote so her conversation is with the TV and not her son.  She called me a Sales promotion account executive.  I did notify Dr Jacqulyn Bath and he did order a psych eval and a sitter.

## 2024-01-17 NOTE — Progress Notes (Signed)
 PROGRESS NOTE    [name]  JXB:[id number] DOB: [birth year] DOA: 01/12/2024 PCP: Diamantina Providence, FNP   Brief Narrative:  68 yoF w/ PMH significant for seizures with med non-compliance, ETOH abuse, anxiety/ depression presented by EMS for AMS. Witnessed seizure x 2 with EMS s/p versed. On arrival to ER, no further seizure activity but required intubation for airway protection and O2 desaturations. CTH neg. Loaded with keppra and vimat started. Sedated on fentanyl/ versed gtt. Neurology consulted. Labs show AKI, ETOH neg, K 2.7.  Admitted under PCCM and eventually extubated on 01/14/2024.  Transferred under TRH 01/06/2024.  Assessment & Plan:   Principal Problem:   Status epilepticus (HCC)  History of seizure and noncompliance to the medications presented with acute encephalopathy secondary to presumed status epilepticus: Improved.  Antiseizure medications per neurology.  Currently on Keppra and Vimpat.  Patient lost IV access 01/16/2024, IV team could not place it, antiseizure medications transitioned to p.o. route.   Acute hypoxic respiratory failure secondary to presumed right lower lobe aspiration pneumonia and pneumonitis plus atelectasis: Intubated 3/ 26 and extubated 01/14/2024. S/p bronchoscopy 3/26.  Has been on Unasyn and now on room air.   Hypokalemia: Resolved.   Hypoglycemia secondary to poor p.o. intake due to dysphagia: SLP following, plan was to do MBS but patient passed swallow evaluation so MBS canceled.  Currently on full liquid diet.  Blood sugar within normal range.  Hypertension: Fairly controlled.  Continue amlodipine.  Sinus tachycardia: Intermittent and likely secondary to agitation.  No arrhythmia.  Improves when patient calms down.  Agitation/paranoia/delirium: 01/16/2024, when I entered the room, nurse and nurse were at the bedside and noted that patient was trying to be belligerent, agitated and tried to kick the staff.  She was already in hand  mittens when I entered.  Talking to patient, she answered all the orientation questions appropriately so she was fully oriented however she did exhibit signs of paranoia.  I asked her why she was taking the staff, she said " because they are trying to hurt me".  I tried to calm her down and informed her that the staff is eventually trying to help her and she is in the hospital.  Then she said " this is the best place to get hurt".    However today on 01/17/2024, patient was not in restraints, eating breakfast, boyfriend at the bedside.  Patient is fully alert and oriented and does not have any paranoia today.  Based on this, it appears that she was likely going through delirium.  I will start her on Seroquel tonight.  1. Avoid benzodiazepines, antihistamines, anticholinergics, and minimize opiate use as these may worsen delirium. 2: Assess, prevent and manage pain as lack of treatment can result in delirium.  3: Provide appropriate lighting and clear signage; a clock and calendar should be easily visible to the patient. 4: Monitor environmental factors. Reduce light and noise at night (close shades, turn off lights, turn off TV, ect). Correct any alterations in sleep cycle. 5: Reorient the patient to person, place, time and situation on each encounter.  6: Correct sensory deficits if possible (replace eye glasses, hearing aids, ect). 7: Avoid restraints if able. Severely delirious patients benefit from constant observation by a sitter.  Disposition: Seen by PT OT, recommended CIR.  DVT prophylaxis: enoxaparin (LOVENOX) injection 40 mg Start: 01/12/24 1100 SCDs Start: 01/12/24 1043   Code Status: Full Code  Family Communication:  None present at bedside.  Plan of care  discussed with patient in length and he/she verbalized understanding and agreed with it.  Status is: Inpatient Remains inpatient appropriate because: Patient agitated.   Estimated body mass index is 29.83 kg/m as calculated from the  following:   Height as of this encounter: 5\' 1"  (1.549 m).   Weight as of this encounter: 71.6 kg.    Nutritional Assessment: Body mass index is 29.83 kg/m.Marland Kitchen Seen by dietician.  I agree with the assessment and plan as outlined below: Nutrition Status: Nutrition Problem: Inadequate oral intake Etiology: inability to eat Signs/Symptoms: NPO status Interventions: Refer to RD note for recommendations  . Skin Assessment: I have examined the patient's skin and I agree with the wound assessment as performed by the wound care RN as outlined below:    Consultants:  Neurology  Procedures:  As above  Antimicrobials:  Anti-infectives (From admission, onward)    Start     Dose/Rate Route Frequency Ordered Stop   01/16/24 1415  amoxicillin-clavulanate (AUGMENTIN) 875-125 MG per tablet 1 tablet        1 tablet Oral Every 12 hours 01/16/24 1317     01/12/24 1130  Ampicillin-Sulbactam (UNASYN) 3 g in sodium chloride 0.9 % 100 mL IVPB  Status:  Discontinued        3 g 200 mL/hr over 30 Minutes Intravenous Every 6 hours 01/12/24 1134 01/16/24 1318         Subjective: Seen and examined. Sitting in the bed eating breakfast.  Boyfriend at the bedside patient complains of generalized body ache and tells me that she was on pain medications until 5 years ago but she has not taken any and she is requesting to give her opioids.  She was respectfully informed that due to delirium, it is not a good idea to put her on any pain medications however she is allowed to take Tylenol as needed.  Objective: Vitals:   01/16/24 2008 01/16/24 2321 01/17/24 0335 01/17/24 0738  BP: (!) 135/114 (!) 159/90 (!) 155/87 133/78  Pulse: 89 93 95 85  Resp: 18 18 18 18   Temp: 98.1 F (36.7 C) 99 F (37.2 C) 99.1 F (37.3 C) 98.1 F (36.7 C)  TempSrc: Oral Oral Oral   SpO2: 99% 95% 99% 96%  Weight:      Height:        Intake/Output Summary (Last 24 hours) at 01/17/2024 0807 Last data filed at 01/16/2024  2000 Gross per 24 hour  Intake 240 ml  Output 900 ml  Net -660 ml   Filed Weights   01/12/24 0756 01/13/24 0400 01/14/24 0600  Weight: 64 kg 68.5 kg 71.6 kg    Examination:  General exam: Appears calm and comfortable  Respiratory system: Clear to auscultation. Respiratory effort normal. Cardiovascular system: S1 & S2 heard, RRR. No JVD, murmurs, rubs, gallops or clicks. No pedal edema. Gastrointestinal system: Abdomen is nondistended, soft and nontender. No organomegaly or masses felt. Normal bowel sounds heard. Central nervous system: Alert and oriented. No focal neurological deficits. Extremities: Symmetric 5 x 5 power. Skin: No rashes, lesions or ulcers.  Psychiatry: Judgement and insight appear normal. Mood & affect appropriate.    Data Reviewed: I have personally reviewed following labs and imaging studies  CBC: Recent Labs  Lab 01/12/24 0759 01/12/24 0814 01/13/24 0654 01/13/24 2359 01/15/24 0625 01/16/24 1545 01/17/24 0701  WBC 9.4  --  7.7 6.7 6.7 8.4 5.5  NEUTROABS 8.4*  --   --   --   --   --   --  HGB 14.2   < > 13.1 12.9 13.0 14.8 15.2*  HCT 44.0   < > 40.2 38.8 39.4 42.4 45.4  MCV 106.8*  --  103.3* 103.5* 105.3* 98.8 100.7*  PLT 274  --  233 253 287 396 381   < > = values in this interval not displayed.   Basic Metabolic Panel: Recent Labs  Lab 01/12/24 1812 01/13/24 0654 01/13/24 2359 01/14/24 0902 01/15/24 6387 01/15/24 2052 01/15/24 2302 01/16/24 1545 01/16/24 1546 01/17/24 0701  NA  --  140 141   < > 141 137 137  --  135 141  K  --  3.0* 2.9*   < > 2.6* 2.8* 3.0*  --  3.0* 4.5  CL  --  108 108   < > 107 100 102  --  100 105  CO2  --  18* 23   < > 19* 25 22  --  24 18*  GLUCOSE  --  85 83   < > 93 125* 102*  --  97 95  BUN  --  6* 5*   < > <5* <5* <5*  --  <5* <5*  CREATININE 0.61 0.59 0.60   < > 0.61 0.58 0.52  --  0.45 0.52  CALCIUM  --  8.0* 7.7*   < > 7.9* 8.2* 8.3*  --  9.2 9.7  MG 1.4*  --  2.0  --  1.6*  --   --  1.7  --   --    PHOS  --  2.3*  --   --   --   --   --   --   --   --    < > = values in this interval not displayed.   GFR: Estimated Creatinine Clearance: 60.9 mL/min (by C-G formula based on SCr of 0.52 mg/dL). Liver Function Tests: Recent Labs  Lab 01/12/24 0759 01/13/24 2359 01/16/24 1546  AST 40 28 29  ALT 14 13 16   ALKPHOS 63 51 64  BILITOT 0.5 1.0 0.7  PROT 7.0 5.2* 6.7  ALBUMIN 3.9 2.6* 3.1*   No results for input(s): "LIPASE", "AMYLASE" in the last 168 hours. Recent Labs  Lab 01/12/24 1812  AMMONIA 29   Coagulation Profile: No results for input(s): "INR", "PROTIME" in the last 168 hours. Cardiac Enzymes: No results for input(s): "CKTOTAL", "CKMB", "CKMBINDEX", "TROPONINI" in the last 168 hours. BNP (last 3 results) No results for input(s): "PROBNP" in the last 8760 hours. HbA1C: No results for input(s): "HGBA1C" in the last 72 hours. CBG: Recent Labs  Lab 01/16/24 0620 01/16/24 1200 01/16/24 1618 01/16/24 2110 01/17/24 0648  GLUCAP 112* 211* 86 139* 98   Lipid Profile: No results for input(s): "CHOL", "HDL", "LDLCALC", "TRIG", "CHOLHDL", "LDLDIRECT" in the last 72 hours. Thyroid Function Tests: No results for input(s): "TSH", "T4TOTAL", "FREET4", "T3FREE", "THYROIDAB" in the last 72 hours. Anemia Panel: No results for input(s): "VITAMINB12", "FOLATE", "FERRITIN", "TIBC", "IRON", "RETICCTPCT" in the last 72 hours. Sepsis Labs: No results for input(s): "PROCALCITON", "LATICACIDVEN" in the last 168 hours.  Recent Results (from the past 240 hours)  MRSA Next Gen by PCR, Nasal     Status: None   Collection Time: 01/12/24  4:02 PM   Specimen: Nasal Mucosa; Nasal Swab  Result Value Ref Range Status   MRSA by PCR Next Gen NOT DETECTED NOT DETECTED Final    Comment: (NOTE) The GeneXpert MRSA Assay (FDA approved for NASAL specimens only), is one component of a  comprehensive MRSA colonization surveillance program. It is not intended to diagnose MRSA infection nor to  guide or monitor treatment for MRSA infections. Test performance is not FDA approved in patients less than 62 years old. Performed at [facility], [address], [place], [place] [address]      Radiology Studies: No results found.  Scheduled Meds:  amLODipine  10 mg Oral Daily   amoxicillin-clavulanate  1 tablet Oral Q12H   enoxaparin (LOVENOX) injection  40 mg Subcutaneous Q24H   famotidine  20 mg Oral BID   feeding supplement  237 mL Oral BID BM   folic acid  1 mg Oral Daily   gabapentin  300 mg Oral TID   insulin aspart  0-9 Units Subcutaneous TID AC & HS   lacosamide  50 mg Oral BID   levETIRAcetam  500 mg Oral BID   nicotine  21 mg Transdermal Daily   polyethylene glycol  17 g Oral Daily   thiamine  100 mg Oral Daily   Continuous Infusions:     LOS: 5 days   Hughie Closs, MD [facility]  01/17/2024, 8:07 AM   *Please note that this is a verbal dictation therefore any spelling or grammatical errors are due to the "Dragon Medical One" system interpretation.  Please page via Amion and do not message via secure chat for urgent patient care matters. Secure chat can be used for non urgent patient care matters.  How to contact the [facility] Attending or Consulting provider 7A - 7P or covering provider during after hours 7P -7A, for this patient?  Check the care team in [facility] and look for a) attending/consulting [facility] provider listed and b) the [facility] team listed. Page or secure chat 7A-7P. Log into [email or web address] and use [place]'s universal password to access. If you do not have the password, please contact the hospital operator. Locate the [facility] provider you are looking for under [facility] and page to a number that you can be directly reached. If you still have difficulty reaching the provider, please page the [facility] (Director on Call) for the Hospitalists listed on amion for assistance.

## 2024-01-17 NOTE — Plan of Care (Signed)
 Is up in the hallway walking with the sitter.  Is in good spirits.  Is pleasantly confused and does not have any suicidal ideations.  York Spaniel does not want to harm herself or anyone and does not understand why she has a Comptroller.  I did explain to her that what she said warranted that.  She does not recall saying that but just said OK.    Problem: Skin Integrity: Goal: Risk for impaired skin integrity will decrease Outcome: Progressing   Problem: Safety: Goal: Ability to remain free from injury will improve Outcome: Progressing   Problem: Pain Managment: Goal: General experience of comfort will improve and/or be controlled Outcome: Progressing   Problem: Elimination: Goal: Will not experience complications related to urinary retention Outcome: Progressing   Problem: Elimination: Goal: Will not experience complications related to bowel motility Outcome: Progressing   Problem: Coping: Goal: Level of anxiety will decrease Outcome: Progressing

## 2024-01-18 DIAGNOSIS — G9341 Metabolic encephalopathy: Secondary | ICD-10-CM

## 2024-01-18 DIAGNOSIS — G40901 Epilepsy, unspecified, not intractable, with status epilepticus: Secondary | ICD-10-CM | POA: Diagnosis not present

## 2024-01-18 LAB — GLUCOSE, CAPILLARY
Glucose-Capillary: 116 mg/dL — ABNORMAL HIGH (ref 70–99)
Glucose-Capillary: 109 mg/dL — ABNORMAL HIGH (ref 70–99)
Glucose-Capillary: 122 mg/dL — ABNORMAL HIGH (ref 70–99)
Glucose-Capillary: 137 mg/dL — ABNORMAL HIGH (ref 70–99)

## 2024-01-18 MED ORDER — DULOXETINE HCL 30 MG PO CPEP
30.0000 mg | ORAL_CAPSULE | Freq: Every day | ORAL | Status: DC
  Administered 2024-01-18 – 2024-01-20 (×3): 30 mg via ORAL
  Filled 2024-01-18 (×3): qty 1

## 2024-01-18 MED ORDER — HYDROCODONE-ACETAMINOPHEN 5-325 MG PO TABS
1.0000 | ORAL_TABLET | Freq: Four times a day (QID) | ORAL | Status: DC | PRN
  Administered 2024-01-18 – 2024-01-20 (×5): 1 via ORAL
  Filled 2024-01-18 (×5): qty 1

## 2024-01-18 MED ORDER — NALTREXONE HCL 50 MG PO TABS
50.0000 mg | ORAL_TABLET | Freq: Every day | ORAL | Status: DC
  Administered 2024-01-18 – 2024-01-19 (×2): 50 mg via ORAL
  Filled 2024-01-18 (×4): qty 1

## 2024-01-18 NOTE — Progress Notes (Signed)
 VAST consult for PIV. Patient up in the chair. Nurse to reconsult when patient is back to bed and ready for VAST. Tomasita Morrow, RN VAST

## 2024-01-18 NOTE — Progress Notes (Signed)
  Inpatient Rehabilitation Admissions Coordinator   Please refer to Dr Rosalyn Charters consult. Patient not in need of CIR level rehab at this time. We will sign off.  Ottie Glazier, RN, MSN Rehab Admissions Coordinator [phone] 01/18/2024 12:26 PM

## 2024-01-18 NOTE — Discharge Instructions (Addendum)
 On behalf of the Psychiatry Consult team, it was a pleasure caring for you. Below are outlined additional information and resources for when you are discharged from [facility]:   -Recommend abstinence from alcohol, tobacco, and other illicit drug use at discharge.  -If your psychiatric symptoms recur, worsen, or if you have side effects to your psychiatric medications, call your outpatient psychiatric provider, [phone] or go to the nearest emergency department. -If suicidal thoughts recur, call your outpatient psychiatric provider, 911, [phone] or go to the nearest emergency department.   The [facility] will provide timely access to mental health services for children and adolescents (age 21 - 83) and adults presenting in a mental health crisis. The program is designed for those who need urgent behavioral health or substance use treatment and are not experiencing a medical crisis that would typically require an emergency room visit. It is especially appropriate for individuals who may need urgent support or detox services but do not require emergency medical care. This may be a helpful option if the patient is interested in engaging in treatment or safely managing withdrawal symptoms in a monitored setting.   CONTACT INFORMATION Phone: [phone] Address: [address] [place], [place] [address] Hours: Open 24/7, No appointment required.   The following are outpatient clinics available for you to establish care with a psychiatrist for medication management:  [facility]) [address] [place] [address] [phone]  [facility] & private insurance) [phone] [place], [place] [address] [phone]

## 2024-01-18 NOTE — Plan of Care (Signed)
 Talked about needing to have home pharmacy delivered to prevent running out of meds. She states only drinks when she is out of medicine. Psych in to assess. Sitter at bedside. Oriented and shower as well as ambulating in halls frequently. MD notified of low BP and HR increased but back to 100. Order placed for IV team to place PIV.    Problem: Health Behavior/Discharge Planning: Goal: Ability to manage health-related needs will improve Outcome: Progressing   Problem: Activity: Goal: Risk for activity intolerance will decrease Outcome: Progressing   Problem: Nutrition: Goal: Adequate nutrition will be maintained Outcome: Progressing   Problem: Coping: Goal: Level of anxiety will decrease Outcome: Progressing   Problem: Pain Managment: Goal: General experience of comfort will improve and/or be controlled Outcome: Progressing   Problem: Safety: Goal: Ability to remain free from injury will improve Outcome: Progressing
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
## 2024-01-18 NOTE — Progress Notes (Signed)
 Occupational Therapy Treatment Patient Details Name: [name] MRN: [id number] DOB: 10/28/1955 Today's Date: 01/18/2024   History of present illness 56 yoF w/ PMH presented 3/26 with AMS.  Witnessed seizure x 2 with EMS s/p versed.  Pt intubated 3/26 for airway protection and O2 desaturations. CT head: negative. Acute hypoxic respiratory insufficiency related to presumed aspiration RLL PNA/ pneumonitis +/- atelectasis with RLL collapse, s/p bronchoscopy 3/26. Extubated 3/28. PMH: Seizure disorder, ETOH withdrawal, pancreatitis, anxiety, depression and SAH.   OT comments  Pt is making continued progress towards acute OT goals. Pt continues to be limited by deficits listed below. Today session focused on way finding in a MOD distracting environment. Pt provided with two-step task of locating two room numbers in hallway. Pt required MOD A to complete task with verbal and contextual cues. Pt reported increased pain in lower back. Pt educated on compensatory strategies to complete lower body dressing and log roll technique to complete bed mobility for increased comfort. Pt verbalized understanding and was able to demonstrate figure-4. Pt required up to MIN A for functional mobility using SPC due to impairments in balance. OT will continue following pt acutely to address functional needs. Pt denied AIR. Discharge recommendations updated to OP OT based on progression.      If plan is discharge home, recommend the following:  A little help with walking and/or transfers;A little help with bathing/dressing/bathroom;Assistance with cooking/housework;Direct supervision/assist for medications management;Direct supervision/assist for financial management;Assist for transportation;Help with stairs or ramp for entrance;Supervision due to cognitive status   Equipment Recommendations  Tub/shower seat;Tub/shower bench    Recommendations for Other Services      Precautions / Restrictions  Precautions Precautions: Fall;Other (comment) (seizure precautions) Recall of Precautions/Restrictions: Impaired Precaution/Restrictions Comments: suicide precautions 3/31, psych eval ordered Restrictions Weight Bearing Restrictions Per Provider Order: No       Mobility Bed Mobility Overal bed mobility: Needs Assistance             General bed mobility comments: NT; pt received seated EOB upon arrival. Pt reports increased lower back pain. Pt educated on log roll technique to increase comfort and reduce pain. Pt verbalized understanding    Transfers Overall transfer level: Needs assistance Equipment used: None Transfers: Sit to/from Stand Sit to Stand: Contact guard assist           General transfer comment: Pt completed STS without physical assistance. CGA for safety     Balance Overall balance assessment: Needs assistance Sitting-balance support: No upper extremity supported, Feet supported Sitting balance-Leahy Scale: Fair Sitting balance - Comments: demonstrated figure 4 seated EOB   Standing balance support: Single extremity supported, During functional activity Standing balance-Leahy Scale: Fair                             ADL either performed or assessed with clinical judgement   ADL Overall ADL's : Needs assistance/impaired                                       General ADL Comments: session focused on challenging cognition. Pt reports increased pain in lower back. Education was provided on compensatory strategies for comfort    Extremity/Trunk Assessment Upper Extremity Assessment Upper Extremity Assessment: Generalized weakness   Lower Extremity Assessment Lower Extremity Assessment: Defer to PT evaluation        Vision  Vision Assessment?: No apparent visual deficits   Perception Perception Perception: Not tested   Praxis Praxis Praxis: Not tested   Communication Communication Communication: No apparent  difficulties   Cognition Arousal: Alert Behavior During Therapy: WFL for tasks assessed/performed Cognition: Cognition impaired     Awareness: Online awareness impaired Memory impairment (select all impairments): Working Biochemist, clinical functioning impairment (select all impairments): Organization, Reasoning, Problem solving OT - Cognition Comments: Pt engaged in pathfinding task in MOD distracting environment. Pt with difficulty locating and using contextual cues to locate her room. MOD verbal cues were provided to complete task.                 Following commands: Impaired Following commands impaired: Follows multi-step commands inconsistently      Cueing   Cueing Techniques: Verbal cues  Exercises      Shoulder Instructions       General Comments VSS on RA    Pertinent Vitals/ Pain       Pain Assessment Pain Assessment: 0-10 Pain Score: 9  Pain Location: neck, LLE, back Pain Descriptors / Indicators: Aching Pain Intervention(s): Limited activity within patient's tolerance, Monitored during session  Home Living                                          Prior Functioning/Environment              Frequency  Min 2X/week        Progress Toward Goals  OT Goals(current goals can now be found in the care plan section)  Progress towards OT goals: Progressing toward goals  Acute Rehab OT Goals Patient Stated Goal: none stated OT Goal Formulation: With patient Time For Goal Achievement: 01/28/24 Potential to Achieve Goals: Good ADL Goals Pt Will Perform Grooming: Independently;sitting Pt Will Perform Upper Body Bathing: Independently;sitting Pt Will Perform Lower Body Bathing: Independently;sit to/from stand Pt Will Perform Upper Body Dressing: Independently;sitting Pt Will Perform Lower Body Dressing: Independently;sit to/from stand Pt Will Transfer to Toilet: Independently;ambulating;regular height toilet Pt Will Perform  Toileting - Clothing Manipulation and hygiene: Independently;sit to/from stand Additional ADL Goal #1: Pt will be independent in and OOB for basic ADLs  Plan      Co-evaluation                 AM-PAC OT "6 Clicks" Daily Activity     Outcome Measure   Help from another person eating meals?: A Little Help from another person taking care of personal grooming?: A Little Help from another person toileting, which includes using toliet, bedpan, or urinal?: A Little Help from another person bathing (including washing, rinsing, drying)?: A Little Help from another person to put on and taking off regular upper body clothing?: A Little Help from another person to put on and taking off regular lower body clothing?: A Little 6 Click Score: 18    End of Session Equipment Utilized During Treatment: Gait belt;Other (comment) (SPC)  OT Visit Diagnosis: Other abnormalities of gait and mobility (R26.89);Unsteadiness on feet (R26.81);Muscle weakness (generalized) (M62.81);Cognitive communication deficit (R41.841);Other symptoms and signs involving cognitive function Symptoms and signs involving cognitive functions:  (seizure)   Activity Tolerance Patient tolerated treatment well   Patient Left in bed;with nursing/sitter in room;with call bell/phone within reach   Nurse Communication Mobility status        Time: 1355-1410 OT Time  Calculation (min): 15 min  Charges: OT General Charges $OT Visit: 1 Visit OT Treatments $Therapeutic Activity: 8-22 mins  [address] Darliss Cheney 01/18/2024, 3:27 PM

## 2024-01-18 NOTE — Progress Notes (Signed)
 PROGRESS NOTE    [name]  ZOX:[id number] DOB: Jun 17, 1956 DOA: 01/12/2024 PCP: Diamantina Providence, FNP   Brief Narrative:  68 yoF w/ PMH significant for seizures with med non-compliance, ETOH abuse, anxiety/ depression presented by EMS for AMS. Witnessed seizure x 2 with EMS s/p versed. On arrival to ER, no further seizure activity but required intubation for airway protection and O2 desaturations. CTH neg. Loaded with keppra and vimat started. Sedated on fentanyl/ versed gtt. Neurology consulted. Labs show AKI, ETOH neg, K 2.7.  Admitted under PCCM and eventually extubated on 01/14/2024.  Transferred under [facility] 01/06/2024.  Assessment & Plan:   Principal Problem:   Status epilepticus (HCC)  History of seizure and noncompliance to the medications presented with acute encephalopathy secondary to presumed status epilepticus: Improved.  Antiseizure medications per neurology.  Currently on Keppra and Vimpat.  68 01/16/2024, IV team could not place it, antiseizure medications transitioned to p.o. route.   Acute hypoxic respiratory failure secondary to presumed right lower lobe aspiration pneumonia and pneumonitis plus atelectasis: Intubated 3/ 26 and extubated 01/14/2024. S/p bronchoscopy 3/26.  Has been on Unasyn and now on room air.   Hypokalemia: Resolved.   Hypoglycemia secondary to poor p.o. intake due to dysphagia: SLP following, plan was to do MBS but patient passed swallow evaluation so MBS canceled.  Currently on full liquid diet.  Blood sugar within normal range.  Hypertension: Fairly controlled.  Continue amlodipine.  Sinus tachycardia: Intermittent and likely secondary to agitation.  No arrhythmia.  Improves when patient calms down.  Agitation/paranoia/delirium: 01/16/2024, when I entered the room, nurse and nurse were at the bedside and noted that patient was trying to be belligerent, agitated and tried to kick the staff.  She was already in hand  mittens when I entered.  Talking to patient, she answered all the orientation questions appropriately so she was fully oriented however she did exhibit signs of paranoia.  I asked her why she was taking the staff, she said " because they are trying to hurt me".  I tried to calm her down and informed her that the staff is eventually trying to help her and she is in the hospital.  Then she said " this is the best place to get hurt".    However on 01/17/2024, patient was not in restraints, eating breakfast, boyfriend at the bedside.  Patient was fully alert and oriented and did not have any paranoia. Based on this, it appeared that she was likely going through delirium.  Started her on low-dose Seroquel nightly for sleep..  1. Avoid benzodiazepines, antihistamines, anticholinergics, and minimize opiate use as these may worsen delirium. 2: Assess, prevent and manage pain as lack of treatment can result in delirium.  3: Provide appropriate lighting and clear signage; a clock and calendar should be easily visible to the patient. 4: Monitor environmental factors. Reduce light and noise at night (close shades, turn off lights, turn off TV, ect). Correct any alterations in sleep cycle. 5: Reorient the patient to person, place, time and situation on each encounter.  6: Correct sensory deficits if possible (replace eye glasses, hearing aids, ect). 7: Avoid restraints if able. Severely delirious patients benefit from constant observation by a sitter. However later same day, I was notified that patient had made comments about suicidal intentions and wanting to go home.  At that time, psychiatry was consulted and sitter was ordered for her.  Today she tells me that the comment that she  made yesterday was out of her not thinking right.  She further added " I am not going to hurt myself, I have never done so or thoughts so, I think I was drunk".  Patient at times does make comments which does not make sense.  Her boyfriend  brought up the concerns of having intermittent hallucinations even prior to coming to the hospital.  She will definitely benefit from psychiatry.  She tells me that at time in the past, she was getting psychotherapy.  Awaiting psychiatry to assess her.  She denied having any guns in the home.  Disposition: Seen by PT OT, recommended CIR.  DVT prophylaxis: enoxaparin (LOVENOX) injection 40 mg Start: 01/12/24 1100 SCDs Start: 01/12/24 1043   Code Status: Full Code  Family Communication:  None present at bedside.  Plan of care discussed with patient in length and he/she verbalized understanding and agreed with it.  Status is: Inpatient Remains inpatient appropriate because: 68 yoF w/ PMH   Estimated body mass index is 29.83 kg/m as calculated from the following:   Height as of this encounter: 5\' 1"  (1.549 m).   Weight as of this encounter: 71.6 kg.    Nutritional Assessment: Body mass index is 29.83 kg/m.Marland Kitchen Seen by dietician.  I agree with the assessment and plan as outlined below: Nutrition Status: Nutrition Problem: Inadequate oral intake Etiology: social / environmental circumstances (living in hotel, food accessibility issues) Signs/Symptoms: estimated needs, per patient/family report Interventions: Ensure Enlive (each supplement provides 350kcal and 20 grams of protein), Liberalize Diet  . Skin Assessment: I have examined the patient's skin and I agree with the wound assessment as performed by the wound care RN as outlined below:    Consultants:  Neurology  Procedures:  As above  Antimicrobials:  Anti-infectives (From admission, onward)    Start     Dose/Rate Route Frequency Ordered Stop   01/16/24 1415  amoxicillin-clavulanate (AUGMENTIN) 875-125 MG per tablet 1 tablet        1 tablet Oral Every 12 hours 01/16/24 1317 01/18/24 2359   01/12/24 1130  Ampicillin-Sulbactam (UNASYN) 3 g in sodium chloride 0.9 % 100 mL IVPB  Status:  Discontinued        3 g 200 mL/hr  over 30 Minutes Intravenous Every 6 hours 01/12/24 1134 01/16/24 1318         Subjective: Seen and examined, sitting in the chair eating breakfast.  Sitter at the bedside.  Patient has no complaints.  She was fully alert and oriented.  Objective: Vitals:   01/17/24 0738 01/17/24 1127 01/17/24 1645 01/18/24 0755  BP: 133/78 130/85 131/79 107/69  Pulse: 85 94 95 (!) 102  Resp: 18 18  17   Temp: 98.1 F (36.7 C) (!) 97.5 F (36.4 C) 97.7 F (36.5 C) (!) 97.5 F (36.4 C)  TempSrc:  Oral Axillary Oral  SpO2: 96% 99% 98% 99%  Weight:      Height:        Intake/Output Summary (Last 24 hours) at 01/18/2024 0850 Last data filed at 01/17/2024 1500 Gross per 24 hour  Intake --  Output 1 ml  Net -1 ml   Filed Weights   01/12/24 0756 01/13/24 0400 01/14/24 0600  Weight: 64 kg 68.5 kg 71.6 kg    Examination:  General exam: Appears calm and comfortable  Respiratory system: Clear to auscultation. Respiratory effort normal. Cardiovascular system: S1 & S2 heard, RRR. No JVD, murmurs, rubs, gallops or clicks. No pedal edema. Gastrointestinal system: Abdomen is nondistended, soft  and nontender. No organomegaly or masses felt. Normal bowel sounds heard. Central nervous system: Alert and oriented. No focal neurological deficits. Extremities: Symmetric 5 x 5 power. Skin: No rashes, lesions or ulcers.  Psychiatry: Judgement and insight appear poor.  Data Reviewed: I have personally reviewed following labs and imaging studies  CBC: Recent Labs  Lab 01/12/24 0759 01/12/24 0814 01/13/24 0654 01/13/24 2359 01/15/24 0625 01/16/24 1545 01/17/24 0701  WBC 9.4  --  7.7 6.7 6.7 8.4 5.5  NEUTROABS 8.4*  --   --   --   --   --   --   HGB 14.2   < > 13.1 12.9 13.0 14.8 15.2*  HCT 44.0   < > 40.2 38.8 39.4 42.4 45.4  MCV 106.8*  --  103.3* 103.5* 105.3* 98.8 100.7*  PLT 274  --  233 253 287 396 381   < > = values in this interval not displayed.   Basic Metabolic Panel: Recent Labs  Lab  01/12/24 1812 01/13/24 0654 01/13/24 2359 01/14/24 0902 01/15/24 1610 01/15/24 2052 01/15/24 2302 01/16/24 1545 01/16/24 1546 01/17/24 0701  NA  --  140 141   < > 141 137 137  --  135 141  K  --  3.0* 2.9*   < > 2.6* 2.8* 3.0*  --  3.0* 4.5  CL  --  108 108   < > 107 100 102  --  100 105  CO2  --  18* 23   < > 19* 25 22  --  24 18*  GLUCOSE  --  85 83   < > 93 125* 102*  --  97 95  BUN  --  6* 5*   < > <5* <5* <5*  --  <5* <5*  CREATININE 0.61 0.59 0.60   < > 0.61 0.58 0.52  --  0.45 0.52  CALCIUM  --  8.0* 7.7*   < > 7.9* 8.2* 8.3*  --  9.2 9.7  MG 1.4*  --  2.0  --  1.6*  --   --  1.7  --   --   PHOS  --  2.3*  --   --   --   --   --   --   --   --    < > = values in this interval not displayed.   GFR: Estimated Creatinine Clearance: 60.9 mL/min (by C-G formula based on SCr of 0.52 mg/dL). Liver Function Tests: Recent Labs  Lab 01/12/24 0759 01/13/24 2359 01/16/24 1546  AST 40 28 29  ALT 14 13 16   ALKPHOS 63 51 64  BILITOT 0.5 1.0 0.7  PROT 7.0 5.2* 6.7  ALBUMIN 3.9 2.6* 3.1*   No results for input(s): "LIPASE", "AMYLASE" in the last 168 hours. Recent Labs  Lab 01/12/24 1812  AMMONIA 29   Coagulation Profile: No results for input(s): "INR", "PROTIME" in the last 168 hours. Cardiac Enzymes: No results for input(s): "CKTOTAL", "CKMB", "CKMBINDEX", "TROPONINI" in the last 168 hours. BNP (last 3 results) No results for input(s): "PROBNP" in the last 8760 hours. HbA1C: No results for input(s): "HGBA1C" in the last 72 hours. CBG: Recent Labs  Lab 01/17/24 0648 01/17/24 1128 01/17/24 1729 01/17/24 2125 01/18/24 0642  GLUCAP 98 102* 108* 117* 116*   Lipid Profile: No results for input(s): "CHOL", "HDL", "LDLCALC", "TRIG", "CHOLHDL", "LDLDIRECT" in the last 72 hours. Thyroid Function Tests: No results for input(s): "TSH", "T4TOTAL", "FREET4", "T3FREE", "THYROIDAB" in the last 72  hours. Anemia Panel: No results for input(s): "VITAMINB12", "FOLATE",  "FERRITIN", "TIBC", "IRON", "RETICCTPCT" in the last 72 hours. Sepsis Labs: No results for input(s): "PROCALCITON", "LATICACIDVEN" in the last 168 hours.  Recent Results (from the past 240 hours)  MRSA Next Gen by PCR, Nasal     Status: None   Collection Time: 01/12/24  4:02 PM   Specimen: Nasal Mucosa; Nasal Swab  Result Value Ref Range Status   MRSA by PCR Next Gen NOT DETECTED NOT DETECTED Final    Comment: (NOTE) The GeneXpert MRSA Assay (FDA approved for NASAL specimens only), is one component of a comprehensive MRSA colonization surveillance program. It is not intended to diagnose MRSA infection nor to guide or monitor treatment for MRSA infections. Test performance is not FDA approved in patients less than 69 years old. Performed at [facility], [address], [place], [place] [address]      Radiology Studies: No results found.  Scheduled Meds:  amLODipine  10 mg Oral Daily   amoxicillin-clavulanate  1 tablet Oral Q12H   enoxaparin (LOVENOX) injection  40 mg Subcutaneous Q24H   famotidine  20 mg Oral BID   feeding supplement  237 mL Oral BID BM   folic acid  1 mg Oral Daily   gabapentin  300 mg Oral TID   insulin aspart  0-9 Units Subcutaneous TID AC & HS   lacosamide  50 mg Oral BID   levETIRAcetam  500 mg Oral BID   lidocaine  1 patch Transdermal Q24H   nicotine  21 mg Transdermal Daily   polyethylene glycol  17 g Oral Daily   QUEtiapine  25 mg Oral Q24H   thiamine  100 mg Oral Daily   Continuous Infusions:     LOS: 6 days   Hughie Closs, MD [facility]  01/18/2024, 8:50 AM   *Please note that this is a verbal dictation therefore any spelling or grammatical errors are due to the "[employer]agon [employer] One" system interpretation.  Please page via Amion and do not message via secure chat for urgent patient care matters. Secure chat can be used for non urgent patient care matters.  How to contact the [facility] Attending or Consulting provider 7A - 7P or  covering provider during after hours 7P -7A, for this patient?  Check the care team in [facility] and look for a) attending/consulting [facility] provider listed and b) the [facility] team listed. Page or secure chat 7A-7P. Log into [email or web address] and use [place]'s universal password to access. If you do not have the password, please contact the hospital operator. Locate the [facility] provider you are looking for under [facility] and page to a number that you can be directly reached. If you still have difficulty reaching the provider, please page the [facility] (Director on Call) for the Hospitalists listed on amion for assistance.

## 2024-01-18 NOTE — Progress Notes (Signed)
 systolic BP below 100 and asymptomatic; walking halls, took shower and in good spirits and voicing no complaints. Message sent to Dr Jacqulyn Bath.

## 2024-01-18 NOTE — Consult Note (Signed)
 Physical Medicine and Rehabilitation Consult Reason for Consult:debility after respiratory failure Referring Physician: Pahwani   HPI: [name] is a 68 y.o. female with a history of seizures, ETOH abuse, anxiety/depression suffered a witnessed seizure x 2 on 01/12/24. Upon arrival to ED pt's seizures had stopped but she required intubation for airway protection/hypoxia. Keppra and vimpat were started. ETOH negative. K+ 2.7. Pt was treated for RLL aspiration pneumonia--unasyn. RLL collapse with bronchoscopy performed.  Pt stabilized and ultimately was extubated on 01/14/24.  Pt developed agitation/delirium/paranoia requiring restraints and seroquel. Pt demonstrating gradual improvement in behavior and cognition although they are still impaired. With therapy yesterday pt was min assist for sit-std transfers and walked 70' min assist using [place] with frequent drifting and needing cues for cane placement. Pt lives with spouse in one level home with level entry. She used a SP cane for balance/gait PTA.   Review of Systems  Constitutional: Negative.   HENT: Negative.    Respiratory: Negative.    Cardiovascular: Negative.   Genitourinary: Negative.   Musculoskeletal: Negative.   Skin: Negative.   Neurological: Negative.   Psychiatric/Behavioral: Negative.     Past Medical History:  Diagnosis Date   Alcohol dependence (HCC)    Anxiety    Chronic pain    Depression    Diverticulitis    Herniated cervical disc    Pancreatitis    Seizures (HCC)    alcoholic seizures   Past Surgical History:  Procedure Laterality Date   ABDOMINAL HYSTERECTOMY     ANKLE SURGERY Right    CARPAL TUNNEL RELEASE Bilateral    CERVICAL FUSION     CHOLECYSTECTOMY     KNEE SURGERY     Family History  Family history unknown: Yes   Social History:  reports that she has been smoking cigarettes. She has a 1.8 pack-year smoking history. She has never used smokeless tobacco. She reports current  alcohol use. She reports that she does not currently use drugs after having used the following drugs: Marijuana. Allergies:  Allergies  Allergen Reactions   Keflex [Cephalexin] Hives   Cephalosporins Hives   Prednisone Swelling   Toradol [Ketorolac Tromethamine] Hives   Ultram [Tramadol] Hives   Facility-Administered Medications Prior to Admission  Medication Dose Route Frequency Provider Last Rate Last Admin   lacosamide (VIMPAT) tablet 50 mg  50 mg Oral BID        Medications Prior to Admission  Medication Sig Dispense Refill   acetaminophen (TYLENOL) 500 MG tablet Take 500 mg by mouth every 6 (six) hours as needed for moderate pain (pain score 4-6).     ciprofloxacin (CIPRO) 500 MG tablet Take 1 tablet (500 mg total) by mouth 2 (two) times daily. 14 tablet 0   cyanocobalamin 1000 MCG tablet Take 1 tablet (1,000 mcg total) by mouth daily. 30 tablet 2   folic acid (FOLVITE) 1 MG tablet Take 1 tablet (1 mg total) by mouth daily. 30 tablet 2   gabapentin (NEURONTIN) 300 MG capsule Take 1 capsule (300 mg total) by mouth 3 (three) times daily. 90 capsule 0   lacosamide (VIMPAT) 50 MG TABS tablet Take 1 tablet (50 mg total) by mouth 2 (two) times daily. 60 tablet 0   menthol-cetylpyridinium (CEPACOL) 3 MG lozenge Take 1 lozenge (3 mg total) by mouth as needed for sore throat. 100 tablet 12   methocarbamol (ROBAXIN) 500 MG tablet Take 1 tablet (500 mg total) by mouth 2 (two) times daily. 20  tablet 0   metroNIDAZOLE (FLAGYL) 500 MG tablet Take 1 tablet (500 mg total) by mouth 2 (two) times daily. 14 tablet 0   pantoprazole (PROTONIX) 40 MG tablet Take 1 tablet (40 mg total) by mouth daily. 30 tablet 1   thiamine (VITAMIN B1) 100 MG tablet Take 1 tablet (100 mg total) by mouth daily. 30 tablet 2    Home: Home Living Family/patient expects to be discharged to:: Private residence Living Arrangements: Spouse/significant other Available Help at Discharge: Family, Available 24 hours/day Type of  Home: Other(Comment) Dance movement psychotherapist (per pt report, no family available)) Home Access: Level entry Home Layout: One level Bathroom Shower/Tub: Tub/shower unit, Engineer, building services: Standard Home Equipment: Grab bars - tub/shower, Medical laboratory scientific officer - single point  Lives With: Significant other  Functional History: Prior Function Prior Level of Function : Independent/Modified Independent Mobility Comments: Uses SPC outside, holds onto furniture and walls inside ADLs Comments: Pt independent with ADLs, husband and pt do IADLs, take public transportation Functional Status:  Mobility: Bed Mobility Overal bed mobility: Needs Assistance Bed Mobility: Supine to Sit, Sit to Supine Supine to sit: Supervision Sit to supine: Supervision General bed mobility comments: sup for safety, HOB flat. Transfers Overall transfer level: Needs assistance Equipment used: 1 person hand held assist, None Transfers: Sit to/from Stand Sit to Stand: Min assist General transfer comment: min assist to steady once standing, pt able to power up with CGA Ambulation/Gait Ambulation/Gait assistance: Min assist Gait Distance (Feet): 70 Feet Assistive device: 1 person hand held assist, Straight cane Gait Pattern/deviations: Step-through pattern, Decreased stride length, Drifts right/left General Gait Details: min HHA to steady balance. pt drifting Rt/Lt, pt using SPC in Rt UE and sequencing cane placement appropriately ~50% of gait. Gait velocity: decr Gait velocity interpretation: <1.31 ft/sec, indicative of household ambulator    ADL: ADL General ADL Comments: total A at bed level at this time  Cognition: Cognition Overall Cognitive Status: No family/caregiver present to determine baseline cognitive functioning Arousal/Alertness: Awake/alert Orientation Level: Oriented to person, Disoriented to place, Disoriented to time, Disoriented to situation Day of Week: Correct Attention: Sustained Sustained Attention:  Impaired Sustained Attention Impairment: Verbal basic, Functional basic Memory: Impaired Memory Impairment: Decreased short term memory, Decreased recall of new information, Retrieval deficit, Other (comment) Decreased Short Term Memory: Verbal basic, Functional basic Awareness: Impaired Awareness Impairment: Intellectual impairment Behaviors: Perseveration Comments: Cognition DTA Cognition Arousal: Alert Behavior During Therapy: Flat affect Overall Cognitive Status: No family/caregiver present to determine baseline cognitive functioning  Blood pressure 107/69, pulse (!) 102, temperature (!) 97.5 F (36.4 C), temperature source Oral, resp. rate 17, height 5\' 1"  (1.549 m), weight 71.6 kg, SpO2 99%. Physical Exam Constitutional:      General: She is not in acute distress.    Appearance: She is not ill-appearing.  HENT:     Right Ear: External ear normal.     Left Ear: External ear normal.     Nose: Nose normal.  Eyes:     Pupils: Pupils are equal, round, and reactive to light.  Cardiovascular:     Rate and Rhythm: Tachycardia present.  Pulmonary:     Effort: Pulmonary effort is normal.  Abdominal:     Palpations: Abdomen is soft.  Musculoskeletal:     Cervical back: Normal range of motion.  Skin:    General: Skin is warm.  Neurological:     Mental Status: She is alert.     Comments: Pt with fair insight and awareness. Oriented to person,  place, month/year, why she was here. Provided biographical information. CN non-focal. MMT: 4+/5 BUE and BLE. No focal sensory findings. Excellent standing balance using her straight cane. Ambulated without assistance in hall. Slightly wide based gait but gait appears stable. She takes her time.   Psychiatric:        Mood and Affect: Mood normal.        Behavior: Behavior normal.     Results for orders placed or performed during the hospital encounter of 01/12/24 (from the past 24 hours)  Glucose, capillary     Status: Abnormal    Collection Time: 01/17/24 11:28 AM  Result Value Ref Range   Glucose-Capillary 102 (H) 70 - 99 mg/dL   Comment 1 Notify RN    Comment 2 Document in Chart   Glucose, capillary     Status: Abnormal   Collection Time: 01/17/24  5:29 PM  Result Value Ref Range   Glucose-Capillary 108 (H) 70 - 99 mg/dL   Comment 1 Notify RN   Glucose, capillary     Status: Abnormal   Collection Time: 01/17/24  9:25 PM  Result Value Ref Range   Glucose-Capillary 117 (H) 70 - 99 mg/dL   Comment 1 Notify RN   Glucose, capillary     Status: Abnormal   Collection Time: 01/18/24  6:42 AM  Result Value Ref Range   Glucose-Capillary 116 (H) 70 - 99 mg/dL   Comment 1 Notify RN    No results found.  Assessment/Plan: Diagnosis: 68 yo female s/p seizure with subsequent acute respiratory failure/aspiration pneumonia leading to a metabolic encephalopathy and debility Does the need for close, 24 hr/day medical supervision in concert with the patient's rehab needs make it unreasonable for this patient to be served in a less intensive setting? No Co-Morbidities requiring supervision/potential complications:  -behavior/sleep-wake cycle -nutrition -seizure management -ID considerations -bowel and bladder incontinence/mgt Due to bladder management, bowel management, safety, skin/wound care, disease management, medication administration, pain management, and patient education, does the patient require 24 hr/day rehab nursing? No Does the patient require coordinated care of a physician, rehab nurse, therapy disciplines of PT, OT, SLP to address physical and functional deficits in the context of the above medical diagnosis(es)? No Addressing deficits in the following areas: balance, endurance, locomotion, strength, transferring, bowel/bladder control, bathing, dressing, feeding, grooming, toileting, cognition, and psychosocial support Can the patient actively participate in an intensive therapy program of at least 3 hrs of  therapy per day at least 5 days per week? Yes The potential for patient to make measurable gains while on inpatient rehab is N/A Anticipated functional outcomes upon discharge from inpatient rehab are modified independent and supervision  with PT, modified independent and supervision with OT, supervision with SLP. Estimated rehab length of stay to reach the above functional goals is: N/A Anticipated discharge destination: Home Overall Rehab/Functional Prognosis: excellent  POST ACUTE RECOMMENDATIONS: This patient's condition is appropriate for continued rehabilitative care in the following setting: Home health PT, OT (vs outpt)  Patient has agreed to participate in recommended program. Yes Note that insurance prior authorization may be required for reimbursement for recommended care.  Comment: Pt was ambulating independently in the hallway. Has fiancee at home. Seems to display adequate safety awareness with gait/cane.   I have personally performed a face to face diagnostic evaluation of this patient. Additionally, I have examined the patient's medical record including any pertinent labs and radiographic images.    Thanks,  Ranelle Oyster, MD 01/18/2024

## 2024-01-18 NOTE — Progress Notes (Signed)
  Patient is alert oriented x 3.  Patient does not have any active suicidal ideation, plan and thought. Patient does not have any homicidal ideation and thoughts. Psychiatry has been evaluated patient in the daytime.  Per psychiatry continue antipsychotic medication include Cymbalta and naltrexone. Evaluated patient at bedside.  Patient does not remember restating any suicidal ideation thought which she stated before in the influence of alcohol.  Earlier tonight patient's fianc Thayer Ohm came to visit him and wanted to take patient home however patient was afraid of him and decided to stay at for further medical management and want to be discharged to be home tomorrow and requesting for establish outpatient psychiatric care for ongoing treatment. Patient stating she will take the bus and go to grandson home this is the only place where she feels safe.  Multiple family members having IV drug use issues.  The patient hemodynamically remained stable and while the blood work  remained within good range patient can be discharged to home next 1 to 2 days. -Continue bedside sitter which has been initiated in the daytime. - Limiting visitor to the patient's room. -Need to discuss with psychiatry in the daytime regarding removed bedside sitter and suicide precaution. -As patient does not have any active suicidal ideation and thought and patient is willing to stay in the hospital for further medical management no requirement to make patient IVC at this moment.   [name], MD [facility] 01/18/2024, 9:31 PM

## 2024-01-18 NOTE — Progress Notes (Signed)
 Pt significant other came and pt is becoming more agitated. Also pt seems to hallucinate more in evenings than during the day according to day shift nurse.Pt visitor becoming more verbally aggressive to staff. This nurse spoke with visitor and he has since calmed. Visitor was told that if he continued to be aggressive towards staff and pt then he would be asked to leave. Will continue to monitor.

## 2024-01-18 NOTE — Progress Notes (Signed)
 Speech Language Pathology Treatment: Cognitive-Linquistic  Patient Details Name: [name] MRN: [id number] DOB: 14-May-1956 Today's Date: 01/18/2024 Time: 1000-1015 SLP Time Calculation (min) (ACUTE ONLY): 15 min  Assessment / Plan / Recommendation Clinical Impression  Patient seen by SLP for skilled treatment focused on cognitive function goals. Patient was awake, alert, sitting EOB with sitter in the room. She was pleasant and cooperative but required cues to redirect as she would frequently speak rather tangentially and tell SLP unsolicited information about her history. Patient tells SLP "My minds been in a fog for a while....Marland Kitchentoo much vodka". She indicated that she has been to rehab for ETOH abuse and been to AA when she was working and that helped her stay sober "for about six months". Patient participated in assessment of her cognition via the SLUMS examination and her score of 16 out of possible 30 is below the range for normal for adults with less than high school education; 25-30. Patient continues to exhibit deficits in areas of memory, attention and problem solving. SLP will continue to follow acutely and recommending skilled SLP services at next venue of care.    HPI HPI: 14 yoF w/ PMH presented 3/26 with AMS. Witnessed seizure x 2 with EMS s/p versed. Pt intubated 3/26 for airway protection and O2 desaturations. CT head: negative. Acute hypoxic respiratory insufficiency related to presumed aspiration RLL PNA/ pneumonitis +/- atelectasis with RLL collapse, s/p bronchoscopy 3/26. Extubated 3/28. PMH: Seizure disorder, ETOH withdrawal, pancreatitis, anxiety, depression and SAH; ST consulted for speech/language cognitive evaluation.      SLP Plan  Continue with current plan of care      Recommendations for follow up therapy are one component of a multi-disciplinary discharge planning process, led by the attending physician.  Recommendations may be updated based on patient  status, additional functional criteria and insurance authorization.    Recommendations   SLP at next venue of care                      Frequent or constant Supervision/Assistance Attention and concentration deficit;Cognitive communication deficit (V78.469)     Continue with current plan of care    Angela Nevin, MA, CCC-SLP Speech Therapy

## 2024-01-19 DIAGNOSIS — G40901 Epilepsy, unspecified, not intractable, with status epilepticus: Secondary | ICD-10-CM | POA: Diagnosis not present

## 2024-01-19 LAB — GLUCOSE, CAPILLARY
Glucose-Capillary: 100 mg/dL — ABNORMAL HIGH (ref 70–99)
Glucose-Capillary: 118 mg/dL — ABNORMAL HIGH (ref 70–99)
Glucose-Capillary: 141 mg/dL — ABNORMAL HIGH (ref 70–99)
Glucose-Capillary: 147 mg/dL — ABNORMAL HIGH (ref 70–99)

## 2024-01-19 MED ORDER — RISPERIDONE 0.5 MG PO TABS
0.5000 mg | ORAL_TABLET | Freq: Two times a day (BID) | ORAL | Status: DC
  Administered 2024-01-19 – 2024-01-20 (×2): 0.5 mg via ORAL
  Filled 2024-01-19 (×2): qty 1
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
## 2024-01-19 NOTE — Care Management Important Message (Signed)
 Important Message  Patient Details  Name: [name] MRN: [id number] Date of Birth: [birth year]   Important Message Given:  Yes - Medicare IM     Dorena Bodo 01/19/2024, 3:26 PM

## 2024-01-19 NOTE — Progress Notes (Signed)
 Physical Therapy Treatment Patient Details Name: [name] MRN: [id number] DOB: 07-23-1956 Today's Date: 01/19/2024   History of Present Illness 31 yoF w/ PMH presented 3/26 with AMS.  Witnessed seizure x 2 with EMS s/p versed.  Pt intubated 3/26 for airway protection and O2 desaturations. CT head: negative. Acute hypoxic respiratory insufficiency related to presumed aspiration RLL PNA/ pneumonitis +/- atelectasis with RLL collapse, s/p bronchoscopy 3/26. Extubated 3/28. PMH: Seizure disorder, ETOH withdrawal, pancreatitis, anxiety, depression and SAH.    PT Comments  Pt is progressing well towards goals. Currently pt is Mod I for all functional activities including sit to stand and gait without an AD for 400 ft. Pt has no overt LOB or deviations. Due to pt current functional status, home set up and available assistance at home no recommended skilled physical therapy services at this time on discharge from acute care hospital setting. Will continue to follow in acute setting in order to ensure that pt returns home with decreased risk for falls, injury, re-hospitalization and improved activity tolerance.      If plan is discharge home, recommend the following: Assistance with cooking/housework;Assist for transportation;Supervision due to cognitive status     Equipment Recommendations  None recommended by PT       Precautions / Restrictions Precautions Precautions: Fall;Other (comment) Recall of Precautions/Restrictions: Impaired Precaution/Restrictions Comments: suicide precautions 3/31, psych eval ordered Restrictions Weight Bearing Restrictions Per Provider Order: No     Mobility  Bed Mobility     General bed mobility comments: Pt sitting EOB at beginning and end of session    Transfers Overall transfer level: Modified independent Equipment used: None Transfers: Sit to/from Stand Sit to Stand: Modified independent (Device/Increase time)           General  transfer comment: Pt completed STS without physical assistance.    Ambulation/Gait Ambulation/Gait assistance: Modified independent (Device/Increase time) Gait Distance (Feet): 400 Feet Assistive device: None Gait Pattern/deviations: Step-through pattern, Decreased stride length Gait velocity: slightly decreased Gait velocity interpretation: >2.62 ft/sec, indicative of community ambulatory   General Gait Details: WFL, no AD.       Balance Overall balance assessment: Mild deficits observed, not formally tested Sitting-balance support: No upper extremity supported, Feet supported Sitting balance-Leahy Scale: Good     Standing balance support: During functional activity, No upper extremity supported Standing balance-Leahy Scale: Good Standing balance comment: no overt LOB        Communication Communication Communication: No apparent difficulties  Cognition Arousal: Alert Behavior During Therapy: WFL for tasks assessed/performed   PT - Cognitive impairments: Orientation, Memory, Attention, Sequencing, Problem solving, Safety/Judgement, Awareness   Orientation impairments: Situation, Time, Place   PT - Cognition Comments: pt knows she is at [facility]. pt verbalizing visual hallucinations throughout session. required repeated cues to attend to task and maintain focus Following commands: Impaired Following commands impaired: Follows multi-step commands inconsistently    Cueing Cueing Techniques: Verbal cues     General Comments General comments (skin integrity, edema, etc.): Vital signs on room air.      Pertinent Vitals/Pain Pain Assessment Pain Assessment: No/denies pain Pain Intervention(s): Monitored during session     PT Goals (current goals can now be found in the care plan section) Acute Rehab PT Goals Patient Stated Goal: pt reports to feel better PT Goal Formulation: With patient/family Time For Goal Achievement: 01/28/24 Potential to Achieve Goals:  Good Progress towards PT goals: Progressing toward goals    Frequency    Min 2X/week  PT Plan  Continue with current POC        AM-PAC PT "6 Clicks" Mobility   Outcome Measure  Help needed turning from your back to your side while in a flat bed without using bedrails?: None Help needed moving from lying on your back to sitting on the side of a flat bed without using bedrails?: None Help needed moving to and from a bed to a chair (including a wheelchair)?: None Help needed standing up from a chair using your arms (e.g., wheelchair or bedside chair)?: None Help needed to walk in hospital room?: None Help needed climbing 3-5 steps with a railing? : A Little 6 Click Score: 23    End of Session Equipment Utilized During Treatment: Gait belt Activity Tolerance: Patient tolerated treatment well Patient left: in bed;with nursing/sitter in room;with call bell/phone within reach Nurse Communication: Mobility status PT Visit Diagnosis: Unsteadiness on feet (R26.81);Muscle weakness (generalized) (M62.81);Difficulty in walking, not elsewhere classified (R26.2);Other symptoms and signs involving the nervous system (R29.898)     Time: 2956-2130 PT Time Calculation (min) (ACUTE ONLY): 9 min  Charges:    $Therapeutic Activity: 8-22 mins PT General Charges $$ ACUTE PT VISIT: 1 Visit                     Harrel Carina, DPT, CLT  Acute Rehabilitation Services Office: [phone] (Secure chat preferred)    Claudia Desanctis 01/19/2024, 2:35 PM

## 2024-01-19 NOTE — Progress Notes (Signed)
 PROGRESS NOTE    Kristy Schomburg  ZOX:[id number] DOB: 04-28-1956 DOA: 01/12/2024 PCP: Diamantina Providence, FNP   Brief Narrative:  68 yoF w/ PMH significant for seizures with med non-compliance, ETOH abuse, anxiety/ depression presented by EMS for AMS. Witnessed seizure x 2 with EMS s/p versed. On arrival to ER, no further seizure activity but required intubation for airway protection and O2 desaturations. CTH neg. Loaded with keppra and vimat started. Sedated on fentanyl/ versed gtt. Neurology consulted. Labs show AKI, ETOH neg, K 2.7.  Admitted under PCCM and eventually extubated on 01/14/2024.  Transferred under [facility] 01/06/2024.  Assessment & Plan:   Principal Problem:   Status epilepticus (HCC)  History of seizure and noncompliance to the medications presented with acute encephalopathy secondary to presumed status epilepticus: Improved.  Antiseizure medications per neurology.  Currently on Keppra and Vimpat.  Patient lost IV access 01/16/2024, IV team could not place it, antiseizure medications transitioned to p.o. route.   Acute hypoxic respiratory failure secondary to presumed right lower lobe aspiration pneumonia and pneumonitis plus atelectasis: Intubated 3/ 26 and extubated 01/14/2024. S/p bronchoscopy 3/26.  Has been on Unasyn and now on room air.   Hypokalemia: Resolved.   Hypoglycemia secondary to poor p.o. intake due to dysphagia: SLP following, plan was to do MBS but patient passed swallow evaluation so MBS canceled.  Currently on full liquid diet.  Blood sugar within normal range.  Hypertension: Fairly controlled.  Continue amlodipine.  Sinus tachycardia: Intermittent and likely secondary to agitation.  No arrhythmia.  Improves when patient calms down.  Agitation/paranoia/delirium: 01/16/2024, when I entered the room, nurse and nurse were at the bedside and noted that patient was trying to be belligerent, agitated and tried to kick the staff.  She was already in hand  mittens when I entered.  Talking to patient, she answered all the orientation questions appropriately so she was fully oriented however she did exhibit signs of paranoia.  I asked her why she was taking the staff, she said " because they are trying to hurt me".  I tried to calm her down and informed her that the staff is eventually trying to help her and she is in the hospital.  Then she said " this is the best place to get hurt".    However on 01/17/2024, patient was not in restraints, eating breakfast, boyfriend at the bedside.  Patient was fully alert and oriented and did not have any paranoia. Based on this, it appeared that she was likely going through delirium.  Started her on low-dose Seroquel nightly for sleep..  1. Avoid benzodiazepines, antihistamines, anticholinergics, and minimize opiate use as these may worsen delirium. 2: Assess, prevent and manage pain as lack of treatment can result in delirium.  3: Provide appropriate lighting and clear signage; a clock and calendar should be easily visible to the patient. 4: Monitor environmental factors. Reduce light and noise at night (close shades, turn off lights, turn off TV, ect). Correct any alterations in sleep cycle. 5: Reorient the patient to person, place, time and situation on each encounter.  6: Correct sensory deficits if possible (replace eye glasses, hearing aids, ect). 7: Avoid restraints if able. Severely delirious patients benefit from constant observation by a sitter. However later same day, I was notified that patient had made comments about suicidal intentions and wanting to go home.  At that time, psychiatry was consulted and sitter was ordered for her.  Today she tells me that the comment that she  made yesterday was out of her not thinking right.  She further added " I am not going to hurt myself, I have never done so or thoughts so, I think I was drunk".  Patient at times does make comments which does not make sense.  Her boyfriend  brought up the concerns of having intermittent hallucinations even prior to coming to the hospital.  She will definitely benefit from psychiatry.  She tells me that at time in the past, she was getting psychotherapy.  She denied having any guns in the home.  She was assessed by psychiatry, started on naltrexone and Cymbalta.  They recommended continuing sitter for another day.  Awaiting assessment by psychiatry again today.  Disposition: Seen by PT OT, recommended CIR however she was assessed by CIR and she is not deemed to be right candidate for them.  Patient wants to go home.  I have requested PT to reassess her today.  DVT prophylaxis: enoxaparin (LOVENOX) injection 40 mg Start: 01/12/24 1100 SCDs Start: 01/12/24 1043   Code Status: Full Code  Family Communication:  None present at bedside.  Plan of care discussed with patient in length and he/she verbalized understanding and agreed with it.  Status is: Inpatient Remains inpatient appropriate because: Patient to be reassessed by PT OT.   Estimated body mass index is 29.83 kg/m as calculated from the following:   Height as of this encounter: 5\' 1"  (1.549 m).   Weight as of this encounter: 71.6 kg.    Nutritional Assessment: Body mass index is 29.83 kg/m.Marland Kitchen Seen by dietician.  I agree with the assessment and plan as outlined below: Nutrition Status: Nutrition Problem: Inadequate oral intake Etiology: social / environmental circumstances (living in hotel, food accessibility issues) Signs/Symptoms: estimated needs, per patient/family report Interventions: Ensure Enlive (each supplement provides 350kcal and 20 grams of protein), Liberalize Diet  . Skin Assessment: I have examined the patient's skin and I agree with the wound assessment as performed by the wound care RN as outlined below:    Consultants:  Neurology  Procedures:  As above  Antimicrobials:  Anti-infectives (From admission, onward)    Start     Dose/Rate Route  Frequency Ordered Stop   01/16/24 1415  amoxicillin-clavulanate (AUGMENTIN) 875-125 MG per tablet 1 tablet        1 tablet Oral Every 12 hours 01/16/24 1317 01/18/24 2109   01/12/24 1130  Ampicillin-Sulbactam (UNASYN) 3 g in sodium chloride 0.9 % 100 mL IVPB  Status:  Discontinued        3 g 200 mL/hr over 30 Minutes Intravenous Every 6 hours 01/12/24 1134 01/16/24 1318         Subjective: Seen and examined.  Patient has no complaints.  She is fully alert and oriented.  Sitter at the bedside.  Patient denies suicidal intentions or thoughts.  She desires to go home instead of going to any rehabilitation.  I have requested psychiatry to perform capacity evaluation on her.  Objective: Vitals:   01/19/24 0830 01/19/24 0900 01/19/24 0955 01/19/24 1137  BP:   109/65 129/73  Pulse:   92 96  Resp: 18 (!) 25 19 17   Temp:   97.9 F (36.6 C) 98.3 F (36.8 C)  TempSrc:   Oral Oral  SpO2:   96% 97%  Weight:      Height:        Intake/Output Summary (Last 24 hours) at 01/19/2024 1322 Last data filed at 01/19/2024 0846 Gross per 24 hour  Intake 360 ml  Output --  Net 360 ml   Filed Weights   01/12/24 0756 01/13/24 0400 01/14/24 0600  Weight: 64 kg 68.5 kg 71.6 kg    Examination:  General exam: Appears calm and comfortable  Respiratory system: Clear to auscultation. Respiratory effort normal. Cardiovascular system: S1 & S2 heard, RRR. No JVD, murmurs, rubs, gallops or clicks. No pedal edema. Gastrointestinal system: Abdomen is nondistended, soft and nontender. No organomegaly or masses felt. Normal bowel sounds heard. Central nervous system: Alert and oriented. No focal neurological deficits. Extremities: Symmetric 5 x 5 power. Skin: No rashes, lesions or ulcers.    Data Reviewed: I have personally reviewed following labs and imaging studies  CBC: Recent Labs  Lab 01/13/24 0654 01/13/24 2359 01/15/24 0625 01/16/24 1545 01/17/24 0701  WBC 7.7 6.7 6.7 8.4 5.5  HGB 13.1 12.9  13.0 14.8 15.2*  HCT 40.2 38.8 39.4 42.4 45.4  MCV 103.3* 103.5* 105.3* 98.8 100.7*  PLT 233 253 287 396 381   Basic Metabolic Panel: Recent Labs  Lab 01/12/24 1812 01/12/24 1812 01/13/24 0654 01/13/24 2359 01/14/24 0902 01/15/24 0625 01/15/24 2052 01/15/24 2302 01/16/24 1545 01/16/24 1546 01/17/24 0701  NA  --    < > 140 141   < > 141 137 137  --  135 141  K  --    < > 3.0* 2.9*   < > 2.6* 2.8* 3.0*  --  3.0* 4.5  CL  --    < > 108 108   < > 107 100 102  --  100 105  CO2  --    < > 18* 23   < > 19* 25 22  --  24 18*  GLUCOSE  --    < > 85 83   < > 93 125* 102*  --  97 95  BUN  --    < > 6* 5*   < > <5* <5* <5*  --  <5* <5*  CREATININE 0.61  --  0.59 0.60   < > 0.61 0.58 0.52  --  0.45 0.52  CALCIUM  --    < > 8.0* 7.7*   < > 7.9* 8.2* 8.3*  --  9.2 9.7  MG 1.4*  --   --  2.0  --  1.6*  --   --  1.7  --   --   PHOS  --   --  2.3*  --   --   --   --   --   --   --   --    < > = values in this interval not displayed.   GFR: Estimated Creatinine Clearance: 60.9 mL/min (by C-G formula based on SCr of 0.52 mg/dL). Liver Function Tests: Recent Labs  Lab 01/13/24 2359 01/16/24 1546  AST 28 29  ALT 13 16  ALKPHOS 51 64  BILITOT 1.0 0.7  PROT 5.2* 6.7  ALBUMIN 2.6* 3.1*   No results for input(s): "LIPASE", "AMYLASE" in the last 168 hours. Recent Labs  Lab 01/12/24 1812  AMMONIA 29   Coagulation Profile: No results for input(s): "INR", "PROTIME" in the last 168 hours. Cardiac Enzymes: No results for input(s): "CKTOTAL", "CKMB", "CKMBINDEX", "TROPONINI" in the last 168 hours. BNP (last 3 results) No results for input(s): "PROBNP" in the last 8760 hours. HbA1C: No results for input(s): "HGBA1C" in the last 72 hours. CBG: Recent Labs  Lab 01/18/24 1206 01/18/24 1630 01/18/24 2127 01/19/24 0616 01/19/24 1142  GLUCAP 109* 122* 137* 100* 118*   Lipid Profile: No results for input(s): "CHOL", "HDL", "LDLCALC", "TRIG", "CHOLHDL", "LDLDIRECT" in the last 72  hours. Thyroid Function Tests: No results for input(s): "TSH", "T4TOTAL", "FREET4", "T3FREE", "THYROIDAB" in the last 72 hours. Anemia Panel: No results for input(s): "VITAMINB12", "FOLATE", "FERRITIN", "TIBC", "IRON", "RETICCTPCT" in the last 72 hours. Sepsis Labs: No results for input(s): "PROCALCITON", "LATICACIDVEN" in the last 168 hours.  Recent Results (from the past 240 hours)  MRSA Next Gen by PCR, Nasal     Status: None   Collection Time: 01/12/24  4:02 PM   Specimen: Nasal Mucosa; Nasal Swab  Result Value Ref Range Status   MRSA by PCR Next Gen NOT DETECTED NOT DETECTED Final    Comment: (NOTE) The GeneXpert MRSA Assay (FDA approved for NASAL specimens only), is one component of a comprehensive MRSA colonization surveillance program. It is not intended to diagnose MRSA infection nor to guide or monitor treatment for MRSA infections. Test performance is not FDA approved in patients less than 61 years old. Performed at [facility], [address] [place], [place] [address]      Radiology Studies: No results found.  Scheduled Meds:  amLODipine  10 mg Oral Daily   DULoxetine  30 mg Oral Daily   enoxaparin (LOVENOX) injection  40 mg Subcutaneous Q24H   famotidine  20 mg Oral BID   feeding supplement  237 mL Oral BID BM   folic acid  1 mg Oral Daily   gabapentin  300 mg Oral TID   insulin aspart  0-9 Units Subcutaneous TID AC & HS   lacosamide  50 mg Oral BID   levETIRAcetam  500 mg Oral BID   lidocaine  1 patch Transdermal Q24H   naltrexone  50 mg Oral QHS   nicotine  21 mg Transdermal Daily   polyethylene glycol  17 g Oral Daily   QUEtiapine  25 mg Oral Q24H   thiamine  100 mg Oral Daily   Continuous Infusions:     LOS: 7 days   Hughie Closs, MD [facility]  01/19/2024, 1:22 PM   *Please note that this is a verbal dictation therefore any spelling or grammatical errors are due to the "[employer]agon [employer] One" system interpretation.  Please page  via Amion and do not message via secure chat for urgent patient care matters. Secure chat can be used for non urgent patient care matters.  How to contact the [facility] Attending or Consulting provider 7A - 7P or covering provider during after hours 7P -7A, for this patient?  Check the care team in [facility] and look for a) attending/consulting [facility] provider listed and b) the [facility] team listed. Page or secure chat 7A-7P. Log into [email or web address] and use [facility] universal password to access. If you do not have the password, please contact the hospital operator. Locate the [facility] provider you are looking for under [facility] and page to a number that you can be directly reached. If you still have difficulty reaching the provider, please page the [facility] (Director on Call) for the Hospitalists listed on amion for assistance.

## 2024-01-19 NOTE — Plan of Care (Signed)
 Explained to patient that doctors will inform us when they discharge her from the hospital. Ambulating with sitter. Ate meals today. Sitter at bedside. No confusion at this time. Prn pain med x 1 today. No further instances in her room today.     Problem: Nutrition: Goal: Adequate nutrition will be maintained Outcome: Progressing   Problem: Safety: Goal: Ability to remain free from injury will improve Outcome: Progressing

## 2024-01-20 ENCOUNTER — Other Ambulatory Visit (HOSPITAL_COMMUNITY): Payer: Self-pay

## 2024-01-20 DIAGNOSIS — G40901 Epilepsy, unspecified, not intractable, with status epilepticus: Secondary | ICD-10-CM | POA: Diagnosis not present

## 2024-01-20 LAB — GLUCOSE, CAPILLARY
Glucose-Capillary: 90 mg/dL (ref 70–99)
Glucose-Capillary: 123 mg/dL — ABNORMAL HIGH (ref 70–99)

## 2024-01-20 MED ORDER — NALTREXONE HCL 50 MG PO TABS
50.0000 mg | ORAL_TABLET | Freq: Every day | ORAL | 0 refills | Status: AC
  Filled 2024-01-20: qty 30, 30d supply, fill #0

## 2024-01-20 MED ORDER — RISPERIDONE 0.5 MG PO TABS
0.5000 mg | ORAL_TABLET | Freq: Two times a day (BID) | ORAL | 0 refills | Status: DC
  Filled 2024-01-20: qty 60, 30d supply, fill #0

## 2024-01-20 MED ORDER — DULOXETINE HCL 30 MG PO CPEP
30.0000 mg | ORAL_CAPSULE | Freq: Every day | ORAL | 0 refills | Status: DC
  Filled 2024-01-20: qty 30, 30d supply, fill #0

## 2024-01-20 MED ORDER — LEVETIRACETAM 500 MG PO TABS
500.0000 mg | ORAL_TABLET | Freq: Two times a day (BID) | ORAL | 0 refills | Status: DC
  Filled 2024-01-20: qty 60, 30d supply, fill #0

## 2024-01-20 MED ORDER — PANTOPRAZOLE SODIUM 40 MG PO TBEC
40.0000 mg | DELAYED_RELEASE_TABLET | Freq: Every day | ORAL | 0 refills | Status: DC
  Filled 2024-01-20 – 2024-01-28 (×2): qty 30, 30d supply, fill #0

## 2024-01-20 MED ORDER — LACOSAMIDE 50 MG PO TABS
50.0000 mg | ORAL_TABLET | Freq: Two times a day (BID) | ORAL | 0 refills | Status: DC
  Filled 2024-01-20: qty 60, 30d supply, fill #0
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
## 2024-01-20 NOTE — Plan of Care (Addendum)
 Pt is calm, follow commands, at rest. Still with sitter at bedside. Assisted ambulation to bathroom. Complaint of body pain, prn meds given. No other significant changes happen as of this time.  Problem: Education: Goal: Knowledge of General Education information will improve Description: Including pain rating scale, medication(s)/side effects and non-pharmacologic comfort measures Outcome: Progressing   Problem: Health Behavior/Discharge Planning: Goal: Ability to manage health-related needs will improve Outcome: Progressing   Problem: Clinical Measurements: Goal: Will remain free from infection Outcome: Progressing   Problem: Activity: Goal: Risk for activity intolerance will decrease Outcome: Progressing   Problem: Nutrition: Goal: Adequate nutrition will be maintained Outcome: Progressing   Problem: Coping: Goal: Level of anxiety will decrease Outcome: Progressing   Problem: Pain Managment: Goal: General experience of comfort will improve and/or be controlled Outcome: Progressing   Problem: Safety: Goal: Ability to remain free from injury will improve Outcome: Progressing

## 2024-01-20 NOTE — Discharge Summary (Signed)
 Physician Discharge Summary  [name] GMW:[id number] DOB: Aug 22, 1956 DOA: 01/12/2024  PCP: Diamantina Providence, FNP  Admit date: 01/12/2024 Discharge date: 01/20/2024 30 Day Unplanned Readmission Risk Score    Flowsheet Row ED to Hosp-Admission (Current) from 01/12/2024 in [facility]  30 Day Unplanned Readmission Risk Score (%) 22.73 Filed at 01/20/2024 0801       This score is the patient's risk of an unplanned readmission within 30 days of being discharged (0 -100%). The score is based on dignosis, age, lab data, medications, orders, and past utilization.   Low:  0-14.9   Medium: 15-21.9   High: 22-29.9   Extreme: 30 and above          Admitted From: Home Disposition: Home  Recommendations for Outpatient Follow-up:  Follow up with PCP in 1-2 weeks Please obtain BMP/CBC in one week Follow-up with neurology in 4 weeks Follow-up with psychiatry in 4 weeks Please follow up with your PCP on the following pending results: Unresulted Labs (From admission, onward)    None         Home Health: None none Equipment/Devices: None  Discharge Condition: Stable CODE STATUS: Full code Diet recommendation: Cardiac  Subjective: Seen and examined.  Sitting in the chair.  No complaints.  Fully alert and oriented.  She wants to go home.  I discussed with her about her relationship and situation at the home with her boyfriend.  She said that he is her fianc and they have been living together for 25 years.  As many couples do, they also have some agreements now and then but she said that he is very nice and takes care of her and she feels safe going home with him.  Brief/Interim Summary: 68 yoF w/ PMH significant for seizures with med non-compliance, ETOH abuse, anxiety/ depression presented by EMS for AMS. Witnessed seizure x 2 with EMS s/p versed. On arrival to ER, no further seizure activity but required intubation for airway protection and O2 desaturations.  CTH neg. Loaded with keppra and vimat started. Sedated on fentanyl/ versed gtt. Neurology consulted. Labs show AKI, ETOH neg, K 2.7.  Admitted under PCCM and eventually extubated on 01/14/2024.  Transferred under [facility] 01/06/2024.   History of seizure and noncompliance to the medications presented with acute encephalopathy secondary to presumed status epilepticus: Improved.  Antiseizure medications per neurology and they recommended to continue Keppra and Vimpat.    Acute hypoxic respiratory failure secondary to presumed right lower lobe aspiration pneumonia and pneumonitis plus atelectasis: Intubated 3/ 26 and extubated 01/14/2024. S/p bronchoscopy 3/26.  Has been on Unasyn and now on room air.   Hypokalemia: Resolved.   Hypoglycemia secondary to poor p.o. intake due to dysphagia: SLP following, plan was to do MBS but patient passed swallow evaluation so MBS canceled.  Currently on full liquid diet.  Blood sugar within normal range.   Hypertension: Fairly controlled.  Continue amlodipine.   Sinus tachycardia: Intermittent and likely secondary to agitation.  No arrhythmia.  Improves when patient calms down.   Agitation/paranoia/delirium: 01/16/2024, when I entered the room, nurse and nurse were at the bedside and noted that patient was trying to be belligerent, agitated and tried to kick the staff.  She was already in hand mittens when I entered.  Talking to patient, she answered all the orientation questions appropriately so she was fully oriented however she did exhibit signs of paranoia.  I asked her why she was taking the staff, she said "  because they are trying to hurt me".  I tried to calm her down and informed her that the staff is eventually trying to help her and she is in the hospital.  Then she said " this is the best place to get hurt".     However on 01/17/2024, patient was not in restraints, eating breakfast, boyfriend at the bedside.  Patient was fully alert and oriented and did not have  any paranoia. Based on this, it appeared that she was likely going through delirium.  Started her on low-dose Seroquel nightly for sleep However later same day, I was notified that patient had made comments about suicidal intentions and wanting to go home.  At that time, psychiatry was consulted and sitter was ordered for her.  Today she tells me that the comment that she made yesterday was out of her not thinking right.  She further added " I am not going to hurt myself, I have never done so or thought so, I think I was drunk".  Patient at times does make comments which does not make sense.  Her boyfriend brought up the concerns of having intermittent hallucinations even prior to coming to the hospital.  Psychiatry consulted, she was evaluated couple times, started on Cymbalta and naltrexone and eventually to risperidone.  Psychiatry discussed with the patient that risperidone may increase the risk of seizure and patient agreed with them.  They have now cleared her for discharge.     Disposition: Seen by PT OT, recommended CIR however she was assessed by CIR and she is not deemed to be right candidate for them.  Patient improved, VSS by PT OT, cleared to go home.  Patient wants to go home and she is being discharged in stable condition.  Discharge Diagnoses:  Principal Problem:   Status epilepticus (HCC) Active Problems:   Alcohol use disorder, severe, dependence (HCC)   Major depressive disorder, 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 psychotic features (HCC)   Subarachnoid hemorrhage (HCC)   Thrombocytopenia (HCC)    Discharge Instructions   Allergies as of 01/20/2024       Reactions   Keflex [cephalexin] Hives   Cephalosporins Hives   Prednisone Swelling   Toradol [ketorolac Tromethamine] Hives   Ultram [tramadol] Hives        Medication List     STOP taking these medications    ciprofloxacin 500 MG tablet Commonly known as: CIPRO   metroNIDAZOLE 500 MG tablet Commonly known as: FLAGYL        TAKE these medications    acetaminophen 500 MG tablet Commonly known as: TYLENOL Take 500 mg by mouth every 6 (six) hours as needed for moderate pain (pain score 4-6).   cyanocobalamin 1000 MCG tablet Take 1 tablet (1,000 mcg total) by mouth daily.   DULoxetine 30 MG capsule Commonly known as: CYMBALTA Take 1 capsule (30 mg total) by mouth daily.   folic acid 1 MG tablet Commonly known as: FOLVITE Take 1 tablet (1 mg total) by mouth daily.   gabapentin 300 MG capsule Commonly known as: Neurontin Take 1 capsule (300 mg total) by mouth 3 (three) times daily.   lacosamide 50 MG Tabs tablet Commonly known as: VIMPAT Take 1 tablet (50 mg total) by mouth 2 (two) times daily.   levETIRAcetam 500 MG tablet Commonly known as: KEPPRA Take 1 tablet (500 mg total) by mouth 2 (two) times daily.   menthol-cetylpyridinium 3 MG lozenge Commonly known as: CEPACOL Take 1 lozenge (3 mg total) by mouth  as needed for sore throat.   methocarbamol 500 MG tablet Commonly known as: ROBAXIN Take 1 tablet (500 mg total) by mouth 2 (two) times daily.   naltrexone 50 MG tablet Commonly known as: DEPADE Take 1 tablet (50 mg total) by mouth at bedtime.   pantoprazole 40 MG tablet Commonly known as: Protonix Take 1 tablet (40 mg total) by mouth daily.   risperiDONE 0.5 MG tablet Commonly known as: RISPERDAL Take 1 tablet (0.5 mg total) by mouth 2 (two) times daily.   thiamine 100 MG tablet Commonly known as: VITAMIN B1 Take 1 tablet (100 mg total) by mouth daily.        Follow-up Information     Diamantina Providence, FNP Follow up in 1 week(s).   Specialty: Nurse Practitioner Contact information: [address] [place] [address] [phone]                Allergies  Allergen Reactions   Keflex [Cephalexin] Hives   Cephalosporins Hives   Prednisone Swelling   Toradol [Ketorolac Tromethamine] Hives   Ultram [Tramadol] Hives    Consultations:  Psychiatry and neurology and PCCM   Procedures/Studies: Overnight EEG with video Result Date: 01/13/2024 Charlsie Quest, MD     01/14/2024  8:50 AM Patient Name: Kelby Lotspeich MRN: [id number] Epilepsy Attending: Charlsie Quest Referring Physician/Provider: Rejeana Brock, MD Duration: 01/12/2024 1212 to 01/13/2024 1212 Patient history:  68 y.o. female with a history of seizures with likely medication noncompliance who is presenting with multiple seizures without return to baseline (status epilepticus). EEG to evaluate for seizure Level of alertness: lethargic AEDs during EEG study: LEV, LCM, GBP, versed Technical aspects: This EEG study was done with scalp electrodes positioned according to the 10-20 International system of electrode placement. Electrical activity was reviewed with band pass filter of 1-70Hz , sensitivity of 7 uV/mm, display speed of 71mm/sec with a 60Hz  notched filter applied as appropriate. EEG data were recorded continuously and digitally stored.  Video monitoring was available and reviewed as appropriate. Description: EEG showed continuous generalized and maximal right posterior temporal 3 to 6 Hz theta-delta slowing admixed with 15 to 18 Hz beta activity distributed symmetrically and diffusely. Hyperventilation and photic stimulation were not performed.  ABNORMALITY - Continuous slow, generalized and maximal right posterior temporal  IMPRESSION: This study is suggestive of cortical dysfunction arising from right posterior temporal region likely secondary to underlying structural abnormality. Additionally there is moderate diffuse encephalopathy. No seizures or definite epileptiform discharges were seen throughout the recording.  Please note lack of epileptiform activity during interictal eeg doesn't exclude diagnosis of epilepsy.  Charlsie Quest    DG Chest Port 1 View Result Date: 01/13/2024 CLINICAL DATA:  Status post ET tube placement.  Seizure. EXAM: PORTABLE CHEST  1 VIEW COMPARISON:  01/12/2024 FINDINGS: Endotracheal tube is stable approximately 1.8 cm above the carina. There is an enteric tube which courses below the level of the GE junction. Heart size is normal. Unchanged scratch set asymmetric elevation of the right hemidiaphragm. Partial improvement and right lower lobe atelectasis. Left lung clear. No interstitial edema. IMPRESSION: 1. Stable position of endotracheal tube approximately 1.8 cm above the carina. 2. Partial improvement in right lower lobe atelectasis. 3. Unchanged asymmetric elevation of the right hemidiaphragm. Electronically Signed   By: Signa Kell M.D.   On: 01/13/2024 06:39   DG Chest Portable 1 View Result Date: 01/12/2024 CLINICAL DATA:  Intubation EXAM: PORTABLE CHEST 1 VIEW COMPARISON:  09/25/2023 FINDINGS: Endotracheal tube terminates 1.9 cm above the carina. Enteric tube extends into the stomach. Heart size is within normal limits. Small right pleural effusion with right basilar opacity. Left lung is clear. No pneumothorax. IMPRESSION: 1. Endotracheal tube terminates 1.9 cm above the carina. 2. Small right pleural effusion with right basilar opacity, atelectasis versus pneumonia. Electronically Signed   By: Duanne Guess D.O.   On: 01/12/2024 10:48   CT HEAD WO CONTRAST Result Date: 01/12/2024 CLINICAL DATA:  Mental status change, unknown cause. Seizure, new-onset, no history of trauma. EXAM: CT HEAD WITHOUT CONTRAST TECHNIQUE: Contiguous axial images were obtained from the base of the skull through the vertex without intravenous contrast. RADIATION DOSE REDUCTION: This exam was performed according to the departmental dose-optimization program which includes automated exposure control, adjustment of the mA and/or kV according to patient size and/or use of iterative reconstruction technique. COMPARISON:  Head CT 09/25/2023 and MRI 03/06/2022 FINDINGS: Brain: There is no evidence of an acute infarct, intracranial hemorrhage, mass,  midline shift, or extra-axial fluid collection. Mild cerebral atrophy is again noted. Cerebral white matter hypodensities are similar to the prior CT and are nonspecific but compatible with mild chronic small vessel ischemic disease. There is an unchanged small focus of cortical encephalomalacia anteriorly in the left frontal lobe. Vascular: No hyperdense vessel. Skull: No acute fracture or suspicious lesion. Sinuses/Orbits: Minimal mucosal thickening in the included portions of the paranasal sinuses. Clear mastoid air cells. Left cataract extraction. Other: None. IMPRESSION: 1. No evidence of acute intracranial abnormality. 2. Mild chronic small vessel ischemic disease. Electronically Signed   By: Sebastian Ache M.D.   On: 01/12/2024 09:17     Discharge Exam: Vitals:   01/20/24 0721 01/20/24 1100  BP: 107/72 113/70  Pulse: 97 91  Resp: 19   Temp: 98.4 F (36.9 C) 99.8 F (37.7 C)  SpO2: 93% 96%   Vitals:   01/20/24 0023 01/20/24 0439 01/20/24 0721 01/20/24 1100  BP: 106/69 109/64 107/72 113/70  Pulse: 79 84 97 91  Resp: 16 16 19    Temp: 97.6 F (36.4 C) 98.5 F (36.9 C) 98.4 F (36.9 C) 99.8 F (37.7 C)  TempSrc: Oral Oral Oral Oral  SpO2: 92%  93% 96%  Weight:      Height:        General: Pt is alert, awake, not in acute distress Cardiovascular: RRR, S1/S2 +, no rubs, no gallops Respiratory: CTA bilaterally, no wheezing, no rhonchi Abdominal: Soft, NT, ND, bowel sounds + Extremities: no edema, no cyanosis    The results of significant diagnostics from this hospitalization (including imaging, microbiology, ancillary and laboratory) are listed below for reference.     Microbiology: Recent Results (from the past 240 hours)  MRSA Next Gen by PCR, Nasal     Status: None   Collection Time: 01/12/24  4:02 PM   Specimen: Nasal Mucosa; Nasal Swab  Result Value Ref Range Status   MRSA by PCR Next Gen NOT DETECTED NOT DETECTED Final    Comment: (NOTE) The GeneXpert MRSA Assay  (FDA approved for NASAL specimens only), is one component of a comprehensive MRSA colonization surveillance program. It is not intended to diagnose MRSA infection nor to guide or monitor treatment for MRSA infections. Test performance is not FDA approved in patients less than 47 years old. Performed at [facility], [address], [place], [place] [address]      Labs: BNP (last 3 results) No results for input(s): "BNP" in  the last 8760 hours. Basic Metabolic Panel: Recent Labs  Lab 01/13/24 2359 01/14/24 0902 01/15/24 0625 01/15/24 2052 01/15/24 2302 01/16/24 1545 01/16/24 1546 01/17/24 0701  NA 141   < > 141 137 137  --  135 141  K 2.9*   < > 2.6* 2.8* 3.0*  --  3.0* 4.5  CL 108   < > 107 100 102  --  100 105  CO2 23   < > 19* 25 22  --  24 18*  GLUCOSE 83   < > 93 125* 102*  --  97 95  BUN 5*   < > <5* <5* <5*  --  <5* <5*  CREATININE 0.60   < > 0.61 0.58 0.52  --  0.45 0.52  CALCIUM 7.7*   < > 7.9* 8.2* 8.3*  --  9.2 9.7  MG 2.0  --  1.6*  --   --  1.7  --   --    < > = values in this interval not displayed.   Liver Function Tests: Recent Labs  Lab 01/13/24 2359 01/16/24 1546  AST 28 29  ALT 13 16  ALKPHOS 51 64  BILITOT 1.0 0.7  PROT 5.2* 6.7  ALBUMIN 2.6* 3.1*   No results for input(s): "LIPASE", "AMYLASE" in the last 168 hours. No results for input(s): "AMMONIA" in the last 168 hours. CBC: Recent Labs  Lab 01/13/24 2359 01/15/24 0625 01/16/24 1545 01/17/24 0701  WBC 6.7 6.7 8.4 5.5  HGB 12.9 13.0 14.8 15.2*  HCT 38.8 39.4 42.4 45.4  MCV 103.5* 105.3* 98.8 100.7*  PLT 253 287 396 381   Cardiac Enzymes: No results for input(s): "CKTOTAL", "CKMB", "CKMBINDEX", "TROPONINI" in the last 168 hours. BNP: Invalid input(s): "POCBNP" CBG: Recent Labs  Lab 01/19/24 1142 01/19/24 1622 01/19/24 2108 01/20/24 0543 01/20/24 1101  GLUCAP 118* 141* 147* 90 123*   D-Dimer No results for input(s): "DDIMER" in the last 72 hours. Hgb A1c No  results for input(s): "HGBA1C" in the last 72 hours. Lipid Profile No results for input(s): "CHOL", "HDL", "LDLCALC", "TRIG", "CHOLHDL", "LDLDIRECT" in the last 72 hours. Thyroid function studies No results for input(s): "TSH", "T4TOTAL", "T3FREE", "THYROIDAB" in the last 72 hours.  Invalid input(s): "FREET3" Anemia work up No results for input(s): "VITAMINB12", "FOLATE", "FERRITIN", "TIBC", "IRON", "RETICCTPCT" in the last 72 hours. Urinalysis    Component Value Date/Time   COLORURINE STRAW (A) 10/22/2023 1752   APPEARANCEUR CLEAR 10/22/2023 1752   LABSPEC 1.002 (L) 10/22/2023 1752   PHURINE 6.0 10/22/2023 1752   GLUCOSEU NEGATIVE 10/22/2023 1752   HGBUR NEGATIVE 10/22/2023 1752   BILIRUBINUR NEGATIVE 10/22/2023 1752   KETONESUR NEGATIVE 10/22/2023 1752   PROTEINUR NEGATIVE 10/22/2023 1752   UROBILINOGEN 0.2 07/14/2019 1451   NITRITE NEGATIVE 10/22/2023 1752   LEUKOCYTESUR NEGATIVE 10/22/2023 1752   Sepsis Labs Recent Labs  Lab 01/13/24 2359 01/15/24 0625 01/16/24 1545 01/17/24 0701  WBC 6.7 6.7 8.4 5.5   Microbiology Recent Results (from the past 240 hours)  MRSA Next Gen by PCR, Nasal     Status: None   Collection Time: 01/12/24  4:02 PM   Specimen: Nasal Mucosa; Nasal Swab  Result Value Ref Range Status   MRSA by PCR Next Gen NOT DETECTED NOT DETECTED Final    Comment: (NOTE) The GeneXpert MRSA Assay (FDA approved for NASAL specimens only), is one component of a comprehensive MRSA colonization surveillance program. It is not intended to diagnose MRSA infection nor to guide  or monitor treatment for MRSA infections. Test performance is not FDA approved in patients less than 47 years old. Performed at [facility], [address] [place], [place] [address]     FURTHER DISCHARGE INSTRUCTIONS:   Get Medicines reviewed and adjusted: Please take all your medications with you for your next visit with your Primary MD   Laboratory/radiological data: Please  request your Primary MD to go over all hospital tests and procedure/radiological results at the follow up, please ask your Primary MD to get all Hospital records sent to his/her office.   In some cases, they will be blood work, cultures and biopsy results pending at the time of your discharge. Please request that your primary care M.D. goes through all the records of your hospital data and follows up on these results.   Also Note the following: If you experience worsening of your admission symptoms, develop shortness of breath, life threatening emergency, suicidal or homicidal thoughts you must seek medical attention immediately by calling 911 or calling your MD immediately  if symptoms less severe.   You must read complete instructions/literature along with all the possible adverse reactions/side effects for all the Medicines you take and that have been prescribed to you. Take any new Medicines after you have completely understood and accpet all the possible adverse reactions/side effects.    Do not drive when taking Pain medications or sleeping medications (Benzodaizepines)   Do not take more than prescribed Pain, Sleep and Anxiety Medications. It is not advisable to combine anxiety,sleep and pain medications without talking with your primary care practitioner   Special Instructions: If you have smoked or chewed Tobacco  in the last 2 yrs please stop smoking, stop any regular Alcohol  and or any Recreational drug use.   Wear Seat belts while driving.   Please note: You were cared for by a hospitalist during your hospital stay. Once you are discharged, your primary care physician will handle any further medical issues. Please note that NO REFILLS for any discharge medications will be authorized once you are discharged, as it is imperative that you return to your primary care physician (or establish a relationship with a primary care physician if you do not have one) for your post hospital  discharge needs so that they can reassess your need for medications and monitor your lab values  Time coordinating discharge: Over 30 minutes  SIGNED:   Hughie Closs, MD  [facility] 01/20/2024, 11:18 AM *Please note that this is a verbal dictation therefore any spelling or grammatical errors are due to the "Dragon [employer] One" system interpretation. If 7PM-7AM, please contact night-coverage [email or web address]

## 2024-01-20 NOTE — Progress Notes (Signed)
 Patient discharged to home, AVS reviewed and all questions answered. Cab voucher provided, TOC to provide medications, transport assisted patient to discharge lounge. Contact patient's SO, [name], he is aware she will be discharged soon and he will be at hotel room to let the patient in.

## 2024-01-20 NOTE — TOC Transition Note (Signed)
 Transition of Care [facility]) - Discharge Note   Patient Details  Name: [name] MRN: [id number] Date of Birth: May 10, 1956  Transition [facility] CM/SW Contact:  Kermit Balo, RN Phone Number: 01/20/2024, 11:28 AM   Clinical Narrative:     Pt is discharging back to her motel: [facility]. CM will provide the bedside RN a cab voucher for her transportation. Medications for d/c to be delivered to the room per [facility] pharmacy.   Final next level of care: Home/Self Care Barriers to Discharge: No Barriers Identified   Patient Goals and CMS Choice            Discharge Placement                       Discharge Plan and Services Additional resources added to the After Visit Summary for                                       Social Drivers of Health (SDOH) Interventions SDOH Screenings   Food Insecurity: Patient Unable To Answer (01/12/2024)  Housing: Patient Unable To Answer (01/12/2024)  Transportation Needs: Patient Unable To Answer (01/12/2024)  Utilities: Patient Unable To Answer (01/12/2024)  Alcohol Screen: Medium Risk (12/18/2018)  Depression (PHQ2-9): Medium Risk (05/10/2021)  Financial Resource Strain: Medium Risk (09/27/2018)  Physical Activity: Unknown (09/27/2018)  Social Connections: Patient Unable To Answer (01/12/2024)  Stress: No Stress Concern Present (09/27/2018)  Tobacco Use: High Risk (01/12/2024)     Readmission Risk Interventions     No data to display

## 2024-01-22 ENCOUNTER — Ambulatory Visit: Admission: EM | Admit: 2024-01-22 | Discharge: 2024-01-22 | Disposition: A | Discharge status: Home / Self Care

## 2024-01-22 ENCOUNTER — Other Ambulatory Visit (HOSPITAL_COMMUNITY): Payer: Self-pay

## 2024-01-22 DIAGNOSIS — Z79899 Other long term (current) drug therapy: Secondary | ICD-10-CM

## 2024-01-22 DIAGNOSIS — M7989 Other specified soft tissue disorders: Secondary | ICD-10-CM | POA: Diagnosis not present

## 2024-01-22 NOTE — ED Provider Notes (Signed)
 [facility]    CSN: [id number] Arrival date & time: 01/22/24  1129      History   Chief Complaint Chief Complaint  Patient presents with  . Medication Reaction    HPI [name] is a 68 y.o. female.   33 yoF w/ PMH significant for seizures with med non-compliance, ETOH abuse, anxiety/ depression presented by EMS for AMS. Witnessed seizure x 2 with EMS s/p versed. On arrival to ER, no further seizure activity but required intubation for airway protection and O2 desaturations. CTH neg. Loaded with keppra and vimat started. Sedated on fentanyl/ versed gtt. Neurology consulted. Labs show AKI, ETOH neg, K 2.7.  Admitted under PCCM and eventually extubated on 01/14/2024.  Transferred under [facility] 01/06/2024.   History of seizure and noncompliance to the medications presented with acute encephalopathy secondary to presumed status epilepticus: Improved.  Antiseizure medications per neurology and they recommended to continue Keppra and Vimpat.    Acute hypoxic respiratory failure secondary to presumed right lower lobe aspiration pneumonia and pneumonitis plus atelectasis: Intubated 3/ 26 and extubated 01/14/2024. S/p bronchoscopy 3/26.  Has been on Unasyn and now on room air.   Hypokalemia: Resolved.   Hypoglycemia secondary to poor p.o. intake due to dysphagia: SLP following, plan was to do MBS but patient passed swallow evaluation so MBS canceled.  Currently on full liquid diet.  Blood sugar within normal range.   Hypertension: Fairly controlled.  Continue amlodipine   Pt is discharging back to her motel: [facility]. CM will provide the bedside RN a cab voucher for her transportation. Medications for d/c to be delivered to the room per [facility] pharmacy.    Patient discharged from hospital yesterday  Admit date: 01/12/2024 Discharge date: 01/20/2024  PCP: Diamantina Providence, FNP   Patient lives with boyfriend. Don't drive and doesn't have a car. Transportation through  mainly through city bus.   Patient reports that her mediation was changed from Prozac to Cymbalta while in hospital  Prozac is not listed anywhere in her chart that I can see as a medication she used to take; however, Cymbalta was documented to have been started per psychiatry recommendation   She thinks that she only needs the vimpat for her seizures although neurology recommends Keppra and Vimpat. Patient was prescribed both of these medications prior to being in hospital but ran out/wasn't takin git correctly.   She also needs protonix for her stomach and hydrocodone for her pain, gabapentin   Cymbalta is the only new medication   Discharged home 1 day ago and arrive home around 4 pm   Noted swelling in both legs late last night around 11 pm - swelling to legs, ankle and feet. Can put on her shoes but it is very tight. No chest pain or sob.         Past Medical History:  Diagnosis Date  . Alcohol dependence (HCC)   . Anxiety   . Chronic pain   . Depression   . Diverticulitis   . Herniated cervical disc   . Pancreatitis   . Seizures (HCC)    alcoholic seizures    Patient Active Problem List   Diagnosis Date Noted  . Recurrent seizures (HCC) 08/18/2023  . History of seizure 05/05/2023  . GAD (generalized anxiety disorder) 05/05/2023  . Seizure-like activity (HCC) 12/18/2022  . Seizure (HCC) 12/18/2022  . Hyperkalemia 10/19/2022  . Acute metabolic encephalopathy 10/18/2022  . Seizures (HCC) 10/17/2022  . Chronic pain   .  Hypoalbuminemia   . Hyperglycemia   . Nonadherence to medication   . Seizure (HCC) 05/17/2022  . Status epilepticus (HCC)   . Hypokalemia   . Alcohol-induced mood disorder (HCC)   . Closed displaced oblique fracture of shaft of left humerus 08/01/2020  . GERD (gastroesophageal reflux disease) 02/28/2020  . Alcohol withdrawal related seizure (HCC) 12/13/2018  . Thrombocytopenia (HCC) 12/13/2018  . Major depressive disorder 12/13/2018  .  Neuropathy 12/13/2018  . Subarachnoid hemorrhage (HCC) 12/11/2018  . Seizure due to alcohol withdrawal (HCC) 12/11/2018  . Alcohol dependence with alcohol-induced mood disorder (HCC)   . 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 Alcohol use disorder, severe, dependence (HCC) 05/16/2018    Past Surgical History:  Procedure Laterality Date  . ABDOMINAL HYSTERECTOMY    . ANKLE SURGERY Right   . CARPAL TUNNEL RELEASE Bilateral   . CERVICAL FUSION    . CHOLECYSTECTOMY    . KNEE SURGERY      OB History   No obstetric history on file.      Home Medications    Prior to Admission medications   Medication Sig Start Date End Date Taking? Authorizing Provider  acetaminophen (TYLENOL) 500 MG tablet Take 500 mg by mouth every 6 (six) hours as needed for moderate pain (pain score 4-6).    [provider]  cyanocobalamin 1000 MCG tablet Take 1 tablet (1,000 mcg total) by mouth daily. 09/28/23   Leroy Sea, MD  DULoxetine (CYMBALTA) 30 MG capsule Take 1 capsule (30 mg total) by mouth daily. 01/20/24 02/19/24  Hughie Closs, MD  folic acid (FOLVITE) 1 MG tablet Take 1 tablet (1 mg total) by mouth daily. 09/28/23   Leroy Sea, MD  gabapentin (NEURONTIN) 300 MG capsule Take 1 capsule (300 mg total) by mouth 3 (three) times daily. 10/23/23   Jacalyn Lefevre, MD  lacosamide (VIMPAT) 50 MG TABS tablet Take 1 tablet (50 mg total) by mouth 2 (two) times daily. 01/20/24   Hughie Closs, MD  levETIRAcetam (KEPPRA) 500 MG tablet Take 1 tablet (500 mg total) by mouth 2 (two) times daily. 01/20/24 02/19/24  Hughie Closs, MD  menthol-cetylpyridinium (CEPACOL) 3 MG lozenge Take 1 lozenge (3 mg total) by mouth as needed for sore throat. 09/28/23   Leroy Sea, MD  methocarbamol (ROBAXIN) 500 MG tablet Take 1 tablet (500 mg total) by mouth 2 (two) times daily. 10/23/23   Jacalyn Lefevre, MD  naltrexone (DEPADE) 50 MG tablet Take 1 tablet (50 mg  total) by mouth at bedtime. 01/20/24 02/19/24  Hughie Closs, MD  pantoprazole (PROTONIX) 40 MG tablet Take 1 tablet (40 mg total) by mouth daily. 01/20/24 03/20/24  Hughie Closs, MD  risperiDONE (RISPERDAL) 0.5 MG tablet Take 1 tablet (0.5 mg total) by mouth 2 (two) times daily. 01/20/24 02/19/24  Hughie Closs, MD  thiamine (VITAMIN B1) 100 MG tablet Take 1 tablet (100 mg total) by mouth daily. 09/28/23   Leroy Sea, MD  metoCLOPramide (REGLAN) 10 MG tablet Take 1 tablet (10 mg total) by mouth every 6 (six) hours as needed for nausea (nausea/headache). 04/06/19 05/24/19  Mesner, Barbara Cower, MD    Family History Family History  Family history unknown: Yes    Social History Social History   Tobacco Use  . Smoking status: Every Day    Current packs/day: 0.25    Average packs/day: 0.3 packs/day for 7.0 years (1.8 ttl pk-yrs)    Types: Cigarettes  . Smokeless tobacco:  Never  Vaping Use  . Vaping status: Never Used  Substance Use Topics  . Alcohol use: Yes    Comment: drinks a fifth of vodka daily  . Drug use: Not Currently    Types: Marijuana     Allergies   Keflex [cephalexin], Cephalosporins, Prednisone, Toradol [ketorolac tromethamine], and Ultram [tramadol]   Review of Systems Review of Systems   Physical Exam Triage Vital Signs ED Triage Vitals  Encounter Vitals Group     BP 01/22/24 1143 135/84     Systolic BP Percentile --      Diastolic BP Percentile --      Pulse Rate 01/22/24 1136 93     Resp 01/22/24 1143 18     Temp 01/22/24 1136 (!) 97.5 F (36.4 C)     Temp Source 01/22/24 1136 Oral     SpO2 01/22/24 1143 95 %     Weight --      Height --      Head Circumference --      Peak Flow --      Pain Score 01/22/24 1142 10     Pain Loc --      Pain Education --      Exclude from Growth Chart --    No data found.  Updated Vital Signs BP 135/84 (BP Location: Right Arm)   Pulse 93   Temp (!) 97.5 F (36.4 C) (Oral)   Resp 18   SpO2 95%   Visual Acuity Right  Eye Distance:   Left Eye Distance:   Bilateral Distance:    Right Eye Near:   Left Eye Near:    Bilateral Near:     Physical Exam   UC Treatments / Results  Labs (all labs ordered are listed, but only abnormal results are displayed) Labs Reviewed - No data to display  EKG   Radiology No results found.  Procedures Procedures (including critical care time)  Medications Ordered in UC Medications - No data to display  Initial Impression / Assessment and Plan / UC Course  I have reviewed the triage vital signs and the nursing notes.  Pertinent labs & imaging results that were available during my care of the patient were reviewed by me and considered in my medical decision making (see chart for details).     *** Final Clinical Impressions(s) / UC Diagnoses   Final diagnoses:  Swelling of lower extremity  Polypharmacy     Discharge Instructions      You were seen today for concerns about swelling in your legs, ankles, and feet that began several hours after being discharged from the hospital yesterday. You do not have any chest pain or shortness of breath. There is no known history of heart failure, and your medical records do not show any previous cardiac testing.  It's possible that the swelling is related to Cymbalta, which you mentioned is the only new medication you've started recently. However, I do not recommend making any changes to your medications on your own. Do not stop taking Cymbalta or adjust the dose until you've discussed it with your primary care provider, neurologist, or psychiatrist. Changing your medication without medical guidance could pose serious risks.  Over the weekend, monitor yourself closely. Take all of your medications exactly as prescribed. Try to keep your legs elevated when you are sitting or lying down. Make sure to move around regularly to avoid stiffness and to help reduce the swelling. Avoid sitting or standing in one position  for too  long. Gentle leg exercises can help improve circulation and reduce fluid buildup. Wearing compression stockings may also be helpful. Stick to a low-salt diet to avoid further fluid retention.  On Monday, contact your primary care provider, neurologist, psychiatrist, or case manager to arrange a follow-up.   Go to the ED immediately if:  You develop shortness of breath, especially when you are lying down. You have pain in your chest or abdomen. You feel weak. You feel like you will faint.     ED Prescriptions   None    PDMP not reviewed this encounter.

## 2024-01-22 NOTE — ED Triage Notes (Addendum)
 Pt reports being d/c from the hospital yesterday, was there 8 days. Per pt she was given new medications. States after taking the new medications her legs started swelling along with her feet, has acute back pain and has had trouble walking. Pt has been feeling "wobbly". Declines chest pain and SOB. Pt is mainly concerned of her legs swelling.   Pt is now to take Cymbalta, Keppra, Depade and Risperdal. Pt  states she wants to take Prozaac, this was given to her before. Pt states she does not drink anymore, she does not consume recreational drugs. Pt does smoke cigarettes, half of a pack a day. States last time she saw her previous PCP was last year during the summer.

## 2024-01-22 NOTE — Discharge Instructions (Addendum)
 You were seen today for concerns about swelling in your legs, ankles, and feet that began several hours after being discharged from the hospital yesterday. You do not have any chest pain or shortness of breath. There is no known history of heart failure, and your medical records do not show any previous cardiac testing.  It's possible that the swelling is related to Cymbalta, which you mentioned is the only new medication you've started recently. However, I do not recommend making any changes to your medications on your own. Do not stop taking Cymbalta or adjust the dose until you've discussed it with your primary care provider, neurologist, or psychiatrist. Changing your medication without medical guidance could pose serious risks.  Over the weekend, monitor yourself closely. Take all of your medications exactly as prescribed. Try to keep your legs elevated when you are sitting or lying down. Make sure to move around regularly to avoid stiffness and to help reduce the swelling. Avoid sitting or standing in one position for too long. Gentle leg exercises can help improve circulation and reduce fluid buildup. Wearing compression stockings may also be helpful. Stick to a low-salt diet to avoid further fluid retention.  On Monday, contact your primary care provider, neurologist, psychiatrist, or case manager to arrange a follow-up.   Go to the ED immediately if:  You develop shortness of breath, especially when you are lying down. You have pain in your chest or abdomen. You feel weak. You feel like you will faint.

## 2024-01-28 ENCOUNTER — Other Ambulatory Visit (HOSPITAL_COMMUNITY): Payer: Self-pay

## 2024-01-31 ENCOUNTER — Telehealth: Payer: Self-pay | Admitting: Neurology

## 2024-01-31 ENCOUNTER — Other Ambulatory Visit (HOSPITAL_COMMUNITY): Payer: Self-pay

## 2024-01-31 NOTE — Telephone Encounter (Signed)
Pt called to reschedule appointment.

## 2024-02-01 ENCOUNTER — Other Ambulatory Visit (HOSPITAL_COMMUNITY): Payer: Self-pay

## 2024-02-14 ENCOUNTER — Ambulatory Visit: Admitting: Student in an Organized Health Care Education/Training Program

## 2024-02-21 ENCOUNTER — Ambulatory Visit: Admitting: Student in an Organized Health Care Education/Training Program

## 2024-04-01 ENCOUNTER — Emergency Department (HOSPITAL_COMMUNITY)

## 2024-04-01 ENCOUNTER — Other Ambulatory Visit (HOSPITAL_COMMUNITY): Payer: Self-pay

## 2024-04-01 ENCOUNTER — Emergency Department (HOSPITAL_COMMUNITY)
Admission: EM | Admit: 2024-04-01 | Discharge: 2024-04-01 | Disposition: A | Discharge status: Home / Self Care | Attending: Emergency Medicine | Admitting: Emergency Medicine

## 2024-04-01 DIAGNOSIS — Y92009 Unspecified place in unspecified non-institutional (private) residence as the place of occurrence of the external cause: Secondary | ICD-10-CM | POA: Insufficient documentation

## 2024-04-01 DIAGNOSIS — R519 Headache, unspecified: Secondary | ICD-10-CM | POA: Diagnosis present

## 2024-04-01 DIAGNOSIS — S0501XA Injury of conjunctiva and corneal abrasion without foreign body, right eye, initial encounter: Secondary | ICD-10-CM | POA: Diagnosis not present

## 2024-04-01 DIAGNOSIS — S0231XA Fracture of orbital floor, right side, initial encounter for closed fracture: Secondary | ICD-10-CM | POA: Diagnosis not present

## 2024-04-01 DIAGNOSIS — W01198A Fall on same level from slipping, tripping and stumbling with subsequent striking against other object, initial encounter: Secondary | ICD-10-CM | POA: Insufficient documentation

## 2024-04-01 DIAGNOSIS — S0230XA Fracture of orbital floor, unspecified side, initial encounter for closed fracture: Secondary | ICD-10-CM

## 2024-04-01 MED ORDER — TETRACAINE HCL 0.5 % OP SOLN
2.0000 [drp] | Freq: Once | OPHTHALMIC | Status: AC
  Administered 2024-04-01: 2 [drp] via OPHTHALMIC
  Filled 2024-04-01: qty 4

## 2024-04-01 MED ORDER — ERYTHROMYCIN 5 MG/GM OP OINT: TOPICAL_OINTMENT | OPHTHALMIC | 0 refills | Status: DC

## 2024-04-01 MED ORDER — ACETAMINOPHEN 500 MG PO TABS
500.0000 mg | ORAL_TABLET | Freq: Four times a day (QID) | ORAL | 0 refills | Status: DC | PRN
Start: 2024-04-01 — End: 2024-04-01
  Filled 2024-04-01: qty 30, 8d supply, fill #0

## 2024-04-01 MED ORDER — ERYTHROMYCIN 5 MG/GM OP OINT
TOPICAL_OINTMENT | OPHTHALMIC | 0 refills | Status: DC
  Filled 2024-04-01: qty 3.5, 7d supply, fill #0

## 2024-04-01 MED ORDER — HYDROCODONE-ACETAMINOPHEN 5-325 MG PO TABS
1.0000 | ORAL_TABLET | Freq: Once | ORAL | Status: AC
  Administered 2024-04-01: 1 via ORAL
  Filled 2024-04-01: qty 1

## 2024-04-01 MED ORDER — ERYTHROMYCIN 5 MG/GM OP OINT
TOPICAL_OINTMENT | Freq: Once | OPHTHALMIC | Status: AC
  Filled 2024-04-01: qty 3.5

## 2024-04-01 MED ORDER — FLUORESCEIN SODIUM 1 MG OP STRP
1.0000 | ORAL_STRIP | Freq: Once | OPHTHALMIC | Status: AC
  Administered 2024-04-01: 1 via OPHTHALMIC
  Filled 2024-04-01: qty 1

## 2024-04-01 MED ORDER — ACETAMINOPHEN 500 MG PO TABS: 500.0000 mg | ORAL_TABLET | Freq: Four times a day (QID) | ORAL | 0 refills | Status: DC | PRN

## 2024-04-01 NOTE — Discharge Instructions (Addendum)
 You were seen in the emergency room for fall. The CT scan reveals that you have fracture of the eye socket. You also have mild injury to the eye itself.  Please start applying the topical antibiotic to your eye as prescribed. Please call your eye doctor at [facility] for a follow-up appointment on Monday within a week.  If they are unable to see you, then you can call Dr. Alto Atta, [facility] Ophthalmologist for a follow-up next week.

## 2024-04-01 NOTE — ED Notes (Signed)
 Pt asked me to call a cab so she can leave , informed pt wait to see doctor.

## 2024-04-01 NOTE — ED Triage Notes (Signed)
 Patient BIB from home (hotel room) for eye pain. Patient states she fell yesterday and hit her head, and blacked her eye. Patients eye red and swollen. Per EMS smells of alcohol  and has been talking none stop since transport to ED.   120/78 96.2 A 77 16

## 2024-04-01 NOTE — ED Provider Notes (Signed)
 [place] EMERGENCY DEPARTMENT AT [facility] Provider Note   CSN: [id number] Arrival date & time: 04/01/24  1235     Patient presents with: No chief complaint on file.   [name] is a 68 y.o. female.   HPI    68 year old female comes in with chief complaint of mechanical fall.  Patient has history of alcohol  use disorder, depression, seizures, chronic eye disease both sides, with right-sided loss of vision for which she [facility].  Patient states that her eye doctor is from [place], and works at [employer] and has told her that she might start seeing from her right eye in 6 months after she has some other intervention.  Patient states that she fell yesterday and hit her head.  She is unclear why she fell.  She does not think it was because of alcohol .  She thinks it could be because of seizures, but she is unsure.  Today patient has not drank alcohol .  She states that she has seizure history, but has not been taking her Vimpat  because it causes her to feel unwell.  Patient currently has pain on the right side of her face.  She thinks that she has some eye discomfort as well.  Her vision is unchanged, as she cannot see through the right eye.  She thinks her pain is worse with movement of the eye.  Patient denies any injury elsewhere and has been ambulating since the fall.  Prior to Admission medications   Medication Sig Start Date End Date Taking? Authorizing Provider  acetaminophen  (TYLENOL ) 500 MG tablet Take 1 tablet (500 mg total) by mouth every 6 (six) hours as needed for moderate pain (pain score 4-6). 04/01/24   Deatra Face, MD  cyanocobalamin  1000 MCG tablet Take 1 tablet (1,000 mcg total) by mouth daily. 09/28/23   Singh, Prashant K, MD  DULoxetine  (CYMBALTA ) 30 MG capsule Take 1 capsule (30 mg total) by mouth daily. 01/20/24 02/19/24  Modena Andes, MD  erythromycin ophthalmic ointment Place a 1/2 inch ribbon of ointment into the  lower eyelid. 04/01/24   Deatra Face, MD  folic acid  (FOLVITE ) 1 MG tablet Take 1 tablet (1 mg total) by mouth daily. 09/28/23   Singh, Prashant K, MD  gabapentin  (NEURONTIN ) 300 MG capsule Take 1 capsule (300 mg total) by mouth 3 (three) times daily. 10/23/23   Haviland, Julie, MD  lacosamide  (VIMPAT ) 50 MG TABS tablet Take 1 tablet (50 mg total) by mouth 2 (two) times daily. 01/20/24   Modena Andes, MD  levETIRAcetam  (KEPPRA ) 500 MG tablet Take 1 tablet (500 mg total) by mouth 2 (two) times daily. 01/20/24 02/19/24  Modena Andes, MD  menthol -cetylpyridinium (CEPACOL) 3 MG lozenge Take 1 lozenge (3 mg total) by mouth as needed for sore throat. 09/28/23   Singh, Prashant K, MD  methocarbamol  (ROBAXIN ) 500 MG tablet Take 1 tablet (500 mg total) by mouth 2 (two) times daily. 10/23/23   Sueellen Emery, MD  pantoprazole  (PROTONIX ) 40 MG tablet Take 1 tablet (40 mg total) by mouth daily. 01/20/24 03/20/24  Modena Andes, MD  risperiDONE  (RISPERDAL ) 0.5 MG tablet Take 1 tablet (0.5 mg total) by mouth 2 (two) times daily. 01/20/24 02/19/24  Modena Andes, MD  thiamine  (VITAMIN B1) 100 MG tablet Take 1 tablet (100 mg total) by mouth daily. 09/28/23   Singh, Prashant K, MD  metoCLOPramide  (REGLAN ) 10 MG tablet Take 1 tablet (10 mg total) by mouth every 6 (six) hours as needed  for nausea (nausea/headache). 04/06/19 05/24/19  Mesner, Reymundo Caulk, MD    Allergies: Keflex  [cephalexin ], Cephalosporins, Prednisone , Toradol  [ketorolac  tromethamine ], and Ultram  [tramadol ]    Review of Systems  All other systems reviewed and are negative.   Updated Vital Signs BP (!) 144/91 (BP Location: Right Arm)   Pulse 63   Temp 98.2 F (36.8 C) (Oral)   Resp 18   Ht 5' 1 (1.549 m)   Wt 72.6 kg   SpO2 94%   BMI 30.23 kg/m   Physical Exam Vitals and nursing note reviewed.  Constitutional:      Appearance: She is well-developed.  HENT:     Head: Atraumatic.   Eyes:     Extraocular Movements: Extraocular movements intact.      Pupils: Pupils are equal, round, and reactive to light.     Comments: Patient unable to count fingers to the right eye. She is unable to notice my hand waving at her to the right eye. There is no proptosis.  Patient has significant periorbital ecchymosis on the right side. Eye itself is not injected.  No hyphema.  After tetracaine was applied, fluorescein stain revealed corneal abrasion on the right eye.  Patient also has ocular pressures of 20 and 16   Cardiovascular:     Rate and Rhythm: Normal rate.  Pulmonary:     Effort: Pulmonary effort is normal.   Musculoskeletal:     Cervical back: Neck supple.   Skin:    General: Skin is warm and dry.   Neurological:     Mental Status: She is alert and oriented to person, place, and time.     (all labs ordered are listed, but only abnormal results are displayed) Labs Reviewed - No data to display  EKG: None  Radiology: CT Head Wo Contrast Result Date: 04/01/2024 CLINICAL DATA:  Marvell Slider yesterday, hit head EXAM: CT HEAD WITHOUT CONTRAST TECHNIQUE: Contiguous axial images were obtained from the base of the skull through the vertex without intravenous contrast. RADIATION DOSE REDUCTION: This exam was performed according to the departmental dose-optimization program which includes automated exposure control, adjustment of the mA and/or kV according to patient size and/or use of iterative reconstruction technique. COMPARISON:  01/12/2024 FINDINGS: Brain: No acute infarct or hemorrhage. Lateral ventricles and midline structures are unremarkable. No acute extra-axial fluid collections. No mass effect. Vascular: No hyperdense vessel or unexpected calcification. Skull: Normal. Negative for fracture or focal lesion. Sinuses/Orbits: Blowout type fracture of the right orbital roof, with extensive soft tissue gas in the superior aspect of the right orbit and preseptal soft tissues. Please see separately reported CT facial bone exam. Minimal fluid within the  right frontal and right anterior ethmoid sinus. Other: None. IMPRESSION: 1. No acute intracranial process. 2. Please see separately reported CT facial bone exam describing a right orbital roof fracture. Electronically Signed   By: Bobbye Burrow M.D.   On: 04/01/2024 14:35   CT Maxillofacial WO CM Result Date: 04/01/2024 CLINICAL DATA:  Facial trauma, right periorbital edema, fell yesterday EXAM: CT MAXILLOFACIAL WITHOUT CONTRAST TECHNIQUE: Multidetector CT imaging of the maxillofacial structures was performed. Multiplanar CT image reconstructions were also generated. RADIATION DOSE REDUCTION: This exam was performed according to the departmental dose-optimization program which includes automated exposure control, adjustment of the mA and/or kV according to patient size and/or use of iterative reconstruction technique. COMPARISON:  01/12/2024 FINDINGS: Osseous: There is a minimally displaced blowout type fracture of the right orbital roof, extending into the right frontal sinus. This  results in extensive soft tissue gas within the extraconal fat and preseptal soft tissues of the right orbit. There are no other acute displaced facial bone fractures. No destructive bony lesions. Orbits: Extensive subcutaneous gas in the preseptal space as well as within the extraconal fat in the superior aspect of the right orbit, consistent with right orbital roof fracture extending into the right frontal sinus. The right globe, optic nerve, and extraocular muscles are intact. No extraocular muscle entrapment. Left orbit is unremarkable. Sinuses: Minimal fluid within the right frontal sinus at the site of orbital roof fracture. Mild mucosal thickening within the right anterior ethmoid air cells. The remaining paranasal sinuses are clear. Soft tissues: Extensive right periorbital soft tissue swelling with subcutaneous gas in the preseptal soft tissues as above. Remaining soft tissues are unremarkable. Limited intracranial: No  significant or unexpected finding. IMPRESSION: 1. Minimally displaced blowout type fracture of the right orbital roof, involving the right frontal sinus. This results in extensive soft tissue gas in the extraconal fat superior aspect right orbit as well as throughout the preseptal soft tissues of the right orbit. No extraocular muscle entrapment. 2. Minimal fluid within the right frontal sinus and right anterior ethmoid air cells. Electronically Signed   By: Bobbye Burrow M.D.   On: 04/01/2024 14:33     Procedures   Medications Ordered in the ED  erythromycin ophthalmic ointment (has no administration in time range)  HYDROcodone -acetaminophen  (NORCO/VICODIN) 5-325 MG per tablet 1 tablet (1 tablet Oral Given 04/01/24 1408)  fluorescein ophthalmic strip 1 strip (1 strip Right Eye Given by Other 04/01/24 1420)  tetracaine (PONTOCAINE) 0.5 % ophthalmic solution 2 drop (2 drops Right Eye Given by Other 04/01/24 1420)                                    Medical Decision Making Amount and/or Complexity of Data Reviewed Radiology: ordered.  Risk OTC drugs. Prescription drug management.   68 year old patient comes in with chief complaint of mechanical fall that occurred y'day.  Patient has history of alcohol  use disorder, seizure disorder, chronic eye conditions with right-sided vision loss.  Patient is not on any blood thinners. Collateral history provided by -care everywhere.  It appears that patient had an ophthalmology visit in April.  Patient was supposed to get right-sided cataract surgery.  That surgery was delayed, given patient had critical illness that required ICU admission prior to that visit.  Based on my history and exam, differential diagnosis includes: - Orbital blowout fracture - Ocular compartment syndrome - Globe rupture - Retrograde hematoma - Traumatic brain injury including intracranial hemorrhage - Long bone fractures - Contusions - Soft tissue injury -  Concussion  Based on the initial assessment, the following workup was initiated CT scan of the head, face, C-spine.  Ocular exam will be performed, typically checking for eye pressures.  I have independently interpreted the following imaging from the perspective of acute trauma: CT scan of the brain and the results indicate no evidence of brain bleed.  4:00 PM I spoke with Dr. Alto Atta ophthalmology.  He is okay with patient seeing personal ophthalmologist or Dr. Alto Atta sometime next week.  Results of the ER workup discussed with the patient.  She is aware of the ocular blowout fracture and also corneal abrasion. Patient will try to get in with her eye specialist at [facility].  I advised that patient start taking her seizure medications.  She states that the Vimpat  is something she is having difficulty tolerating, advised that she call her neurologist immediately to see if they can tweak her medications.   Final diagnoses:  Closed fracture of orbital floor (blow-out) (HCC)  Abrasion of right cornea, initial encounter    ED Discharge Orders          Ordered    Ice pack        04/01/24 1524    erythromycin ophthalmic ointment  Status:  Discontinued        04/01/24 1525    acetaminophen  (TYLENOL ) 500 MG tablet  Every 6 hours PRN,   Status:  Discontinued        04/01/24 1525    erythromycin ophthalmic ointment        04/01/24 1531    acetaminophen  (TYLENOL ) 500 MG tablet  Every 6 hours PRN        04/01/24 1531               Deatra Face, MD 04/01/24 1610

## 2024-04-01 NOTE — ED Notes (Signed)
 Pt again saying she needs to leave because there are sicker patients than me in here that need this bed. Pt cooperative and not aggressive.

## 2024-04-13 ENCOUNTER — Encounter: Payer: Self-pay | Admitting: Neurology

## 2024-04-13 ENCOUNTER — Ambulatory Visit: Admitting: Neurology

## 2024-06-11 ENCOUNTER — Emergency Department (HOSPITAL_COMMUNITY)

## 2024-06-11 ENCOUNTER — Inpatient Hospital Stay (HOSPITAL_COMMUNITY)

## 2024-06-11 ENCOUNTER — Inpatient Hospital Stay (HOSPITAL_COMMUNITY)
Admission: EM | Admit: 2024-06-11 | Discharge: 2024-06-15 | DRG: 100 | Disposition: A | Discharge status: Home / Self Care | Attending: Internal Medicine | Admitting: Internal Medicine

## 2024-06-11 DIAGNOSIS — E876 Hypokalemia: Secondary | ICD-10-CM | POA: Diagnosis present

## 2024-06-11 DIAGNOSIS — Z888 Allergy status to other drugs, medicaments and biological substances status: Secondary | ICD-10-CM

## 2024-06-11 DIAGNOSIS — Z90710 Acquired absence of both cervix and uterus: Secondary | ICD-10-CM

## 2024-06-11 DIAGNOSIS — E669 Obesity, unspecified: Secondary | ICD-10-CM | POA: Diagnosis present

## 2024-06-11 DIAGNOSIS — Z91128 Patient's intentional underdosing of medication regimen for other reason: Secondary | ICD-10-CM | POA: Diagnosis not present

## 2024-06-11 DIAGNOSIS — F1010 Alcohol abuse, uncomplicated: Secondary | ICD-10-CM | POA: Diagnosis present

## 2024-06-11 DIAGNOSIS — Z79899 Other long term (current) drug therapy: Secondary | ICD-10-CM

## 2024-06-11 DIAGNOSIS — T426X6A Underdosing of other antiepileptic and sedative-hypnotic drugs, initial encounter: Secondary | ICD-10-CM | POA: Diagnosis present

## 2024-06-11 DIAGNOSIS — G40909 Epilepsy, unspecified, not intractable, without status epilepticus: Secondary | ICD-10-CM | POA: Diagnosis not present

## 2024-06-11 DIAGNOSIS — M79621 Pain in right upper arm: Secondary | ICD-10-CM | POA: Diagnosis not present

## 2024-06-11 DIAGNOSIS — Z91148 Patient's other noncompliance with medication regimen for other reason: Secondary | ICD-10-CM

## 2024-06-11 DIAGNOSIS — F1090 Alcohol use, unspecified, uncomplicated: Secondary | ICD-10-CM | POA: Diagnosis not present

## 2024-06-11 DIAGNOSIS — Z9049 Acquired absence of other specified parts of digestive tract: Secondary | ICD-10-CM

## 2024-06-11 DIAGNOSIS — F17210 Nicotine dependence, cigarettes, uncomplicated: Secondary | ICD-10-CM | POA: Diagnosis present

## 2024-06-11 DIAGNOSIS — F32A Depression, unspecified: Secondary | ICD-10-CM | POA: Diagnosis not present

## 2024-06-11 DIAGNOSIS — R34 Anuria and oliguria: Secondary | ICD-10-CM | POA: Diagnosis not present

## 2024-06-11 DIAGNOSIS — J189 Pneumonia, unspecified organism: Secondary | ICD-10-CM | POA: Diagnosis present

## 2024-06-11 DIAGNOSIS — Z885 Allergy status to narcotic agent status: Secondary | ICD-10-CM

## 2024-06-11 DIAGNOSIS — G40901 Epilepsy, unspecified, not intractable, with status epilepticus: Principal | ICD-10-CM | POA: Diagnosis present

## 2024-06-11 DIAGNOSIS — F329 Major depressive disorder, single episode, unspecified: Secondary | ICD-10-CM | POA: Diagnosis present

## 2024-06-11 DIAGNOSIS — Z881 Allergy status to other antibiotic agents status: Secondary | ICD-10-CM

## 2024-06-11 DIAGNOSIS — R569 Unspecified convulsions: Secondary | ICD-10-CM

## 2024-06-11 DIAGNOSIS — Z981 Arthrodesis status: Secondary | ICD-10-CM

## 2024-06-11 DIAGNOSIS — H903 Sensorineural hearing loss, bilateral: Secondary | ICD-10-CM | POA: Diagnosis present

## 2024-06-11 DIAGNOSIS — Z6830 Body mass index (BMI) 30.0-30.9, adult: Secondary | ICD-10-CM | POA: Diagnosis not present

## 2024-06-11 DIAGNOSIS — J9601 Acute respiratory failure with hypoxia: Secondary | ICD-10-CM | POA: Diagnosis present

## 2024-06-11 LAB — RAPID URINE DRUG SCREEN, HOSP PERFORMED
Amphetamines: NOT DETECTED
Barbiturates: NOT DETECTED
Benzodiazepines: POSITIVE — AB
Cocaine: NOT DETECTED
Opiates: NOT DETECTED
Tetrahydrocannabinol: NOT DETECTED

## 2024-06-11 LAB — I-STAT ARTERIAL BLOOD GAS, ED
Acid-base deficit: 1 mmol/L (ref 0.0–2.0)
Bicarbonate: 24.6 mmol/L (ref 20.0–28.0)
Calcium, Ion: 1.21 mmol/L (ref 1.15–1.40)
HCT: 43 % (ref 36.0–46.0)
Hemoglobin: 14.6 g/dL (ref 12.0–15.0)
O2 Saturation: 100 %
Patient temperature: 97.5
Potassium: 2.2 mmol/L — CL (ref 3.5–5.1)
Sodium: 139 mmol/L (ref 135–145)
TCO2: 26 mmol/L (ref 22–32)
pCO2 arterial: 43.6 mmHg (ref 32–48)
pH, Arterial: 7.356 (ref 7.35–7.45)
pO2, Arterial: 204 mmHg — ABNORMAL HIGH (ref 83–108)

## 2024-06-11 LAB — I-STAT VENOUS BLOOD GAS, ED
Acid-base deficit: 5 mmol/L — ABNORMAL HIGH (ref 0.0–2.0)
Bicarbonate: 20.3 mmol/L (ref 20.0–28.0)
Calcium, Ion: 0.93 mmol/L — ABNORMAL LOW (ref 1.15–1.40)
HCT: 45 % (ref 36.0–46.0)
Hemoglobin: 15.3 g/dL — ABNORMAL HIGH (ref 12.0–15.0)
O2 Saturation: 96 %
Potassium: 7 mmol/L (ref 3.5–5.1)
Sodium: 132 mmol/L — ABNORMAL LOW (ref 135–145)
TCO2: 21 mmol/L — ABNORMAL LOW (ref 22–32)
pCO2, Ven: 38.9 mmHg — ABNORMAL LOW (ref 44–60)
pH, Ven: 7.325 (ref 7.25–7.43)
pO2, Ven: 91 mmHg — ABNORMAL HIGH (ref 32–45)

## 2024-06-11 LAB — CBC WITH DIFFERENTIAL/PLATELET
Abs Immature Granulocytes: 0.24 K/uL — ABNORMAL HIGH (ref 0.00–0.07)
Basophils Absolute: 0.1 K/uL (ref 0.0–0.1)
Basophils Relative: 0 %
Eosinophils Absolute: 0 K/uL (ref 0.0–0.5)
Eosinophils Relative: 0 %
HCT: 43.1 % (ref 36.0–46.0)
Hemoglobin: 14.1 g/dL (ref 12.0–15.0)
Immature Granulocytes: 1 %
Lymphocytes Relative: 3 %
Lymphs Abs: 0.6 K/uL — ABNORMAL LOW (ref 0.7–4.0)
MCH: 35.3 pg — ABNORMAL HIGH (ref 26.0–34.0)
MCHC: 32.7 g/dL (ref 30.0–36.0)
MCV: 107.8 fL — ABNORMAL HIGH (ref 80.0–100.0)
Monocytes Absolute: 0.8 K/uL (ref 0.1–1.0)
Monocytes Relative: 4 %
Neutro Abs: 18.6 K/uL — ABNORMAL HIGH (ref 1.7–7.7)
Neutrophils Relative %: 92 %
Platelets: 278 K/uL (ref 150–400)
RBC: 4 MIL/uL (ref 3.87–5.11)
RDW: 13.3 % (ref 11.5–15.5)
WBC: 20.2 K/uL — ABNORMAL HIGH (ref 4.0–10.5)
nRBC: 0 % (ref 0.0–0.2)

## 2024-06-11 LAB — BASIC METABOLIC PANEL WITH GFR
Anion gap: 14 (ref 5–15)
BUN: <5 mg/dL — ABNORMAL LOW (ref 8–23)
CO2: 24 mmol/L (ref 22–32)
Calcium: 9.2 mg/dL (ref 8.9–10.3)
Chloride: 101 mmol/L (ref 98–111)
Creatinine, Ser: 0.58 mg/dL (ref 0.44–1.00)
GFR, Estimated: >60 mL/min (ref >60)
Glucose, Bld: 125 mg/dL — ABNORMAL HIGH (ref 70–99)
Potassium: 2.4 mmol/L — CL (ref 3.5–5.1)
Sodium: 139 mmol/L (ref 135–145)

## 2024-06-11 LAB — CBC
HCT: 38.7 % (ref 36.0–46.0)
Hemoglobin: 13.1 g/dL (ref 12.0–15.0)
MCH: 34.7 pg — ABNORMAL HIGH (ref 26.0–34.0)
MCHC: 33.9 g/dL (ref 30.0–36.0)
MCV: 102.7 fL — ABNORMAL HIGH (ref 80.0–100.0)
Platelets: 266 K/uL (ref 150–400)
RBC: 3.77 MIL/uL — ABNORMAL LOW (ref 3.87–5.11)
RDW: 13.2 % (ref 11.5–15.5)
WBC: 17.6 K/uL — ABNORMAL HIGH (ref 4.0–10.5)
nRBC: 0 % (ref 0.0–0.2)

## 2024-06-11 LAB — I-STAT CHEM 8, ED
BUN: <3 mg/dL — ABNORMAL LOW (ref 8–23)
Calcium, Ion: 1.07 mmol/L — ABNORMAL LOW (ref 1.15–1.40)
Chloride: 103 mmol/L (ref 98–111)
Creatinine, Ser: 0.7 mg/dL (ref 0.44–1.00)
Glucose, Bld: 257 mg/dL — ABNORMAL HIGH (ref 70–99)
HCT: 54 % — ABNORMAL HIGH (ref 36.0–46.0)
Hemoglobin: 18.4 g/dL — ABNORMAL HIGH (ref 12.0–15.0)
Potassium: 5.2 mmol/L — ABNORMAL HIGH (ref 3.5–5.1)
Sodium: 136 mmol/L (ref 135–145)
TCO2: 21 mmol/L — ABNORMAL LOW (ref 22–32)

## 2024-06-11 LAB — POCT I-STAT EG7
Acid-Base Excess: 5 mmol/L — ABNORMAL HIGH (ref 0.0–2.0)
Bicarbonate: 27.4 mmol/L (ref 20.0–28.0)
Calcium, Ion: 1.07 mmol/L — ABNORMAL LOW (ref 1.15–1.40)
HCT: 39 % (ref 36.0–46.0)
Hemoglobin: 13.3 g/dL (ref 12.0–15.0)
O2 Saturation: 99 %
Potassium: 3.3 mmol/L — ABNORMAL LOW (ref 3.5–5.1)
Sodium: 138 mmol/L (ref 135–145)
TCO2: 28 mmol/L (ref 22–32)
pCO2, Ven: 34.5 mmHg — ABNORMAL LOW (ref 44–60)
pH, Ven: 7.509 — ABNORMAL HIGH (ref 7.25–7.43)
pO2, Ven: 138 mmHg — ABNORMAL HIGH (ref 32–45)

## 2024-06-11 LAB — HEMOGLOBIN A1C
Hgb A1c MFr Bld: 4.5 % — ABNORMAL LOW (ref 4.8–5.6)
Mean Plasma Glucose: 82.45 mg/dL

## 2024-06-11 LAB — COMPREHENSIVE METABOLIC PANEL WITH GFR
ALT: 27 U/L (ref 0–44)
AST: 58 U/L — ABNORMAL HIGH (ref 15–41)
Albumin: 3.9 g/dL (ref 3.5–5.0)
Alkaline Phosphatase: 86 U/L (ref 38–126)
Anion gap: 18 — ABNORMAL HIGH (ref 5–15)
BUN: <5 mg/dL — ABNORMAL LOW (ref 8–23)
CO2: 19 mmol/L — ABNORMAL LOW (ref 22–32)
Calcium: 9.4 mg/dL (ref 8.9–10.3)
Chloride: 103 mmol/L (ref 98–111)
Creatinine, Ser: 0.82 mg/dL (ref 0.44–1.00)
GFR, Estimated: >60 mL/min (ref >60)
Glucose, Bld: 238 mg/dL — ABNORMAL HIGH (ref 70–99)
Potassium: 2.3 mmol/L — CL (ref 3.5–5.1)
Sodium: 140 mmol/L (ref 135–145)
Total Bilirubin: 1.2 mg/dL (ref 0.0–1.2)
Total Protein: 6.9 g/dL (ref 6.5–8.1)

## 2024-06-11 LAB — HIV ANTIBODY (ROUTINE TESTING W REFLEX): HIV Screen 4th Generation wRfx: NONREACTIVE

## 2024-06-11 LAB — AMMONIA: Ammonia: 34 umol/L (ref 9–35)

## 2024-06-11 LAB — ETHANOL: Alcohol, Ethyl (B): <15 mg/dL (ref <15)

## 2024-06-11 LAB — GLUCOSE, CAPILLARY
Glucose-Capillary: 134 mg/dL — ABNORMAL HIGH (ref 70–99)
Glucose-Capillary: 113 mg/dL — ABNORMAL HIGH (ref 70–99)
Glucose-Capillary: 86 mg/dL (ref 70–99)

## 2024-06-11 LAB — I-STAT CG4 LACTIC ACID, ED: Lactic Acid, Venous: 9.6 mmol/L (ref 0.5–1.9)

## 2024-06-11 LAB — LACTIC ACID, PLASMA: Lactic Acid, Venous: 2.7 mmol/L (ref 0.5–1.9)

## 2024-06-11 LAB — CBG MONITORING, ED: Glucose-Capillary: 249 mg/dL — ABNORMAL HIGH (ref 70–99)

## 2024-06-11 MED ORDER — LEVETIRACETAM (KEPPRA) 500 MG/5 ML ADULT IV PUSH
3000.0000 mg | INTRAVENOUS | Status: AC
  Administered 2024-06-11: 3000 mg via INTRAVENOUS

## 2024-06-11 MED ORDER — ENOXAPARIN SODIUM 40 MG/0.4ML IJ SOSY
40.0000 mg | PREFILLED_SYRINGE | INTRAMUSCULAR | Status: DC
  Administered 2024-06-11 – 2024-06-15 (×5): 40 mg via SUBCUTANEOUS
  Filled 2024-06-11 (×5): qty 0.4

## 2024-06-11 MED ORDER — LACTATED RINGERS IV BOLUS
500.0000 mL | Freq: Once | INTRAVENOUS | Status: AC
  Administered 2024-06-11: 500 mL via INTRAVENOUS

## 2024-06-11 MED ORDER — FENTANYL BOLUS VIA INFUSION
25.0000 ug | INTRAVENOUS | Status: DC | PRN
  Administered 2024-06-11 – 2024-06-12 (×3): 50 ug via INTRAVENOUS

## 2024-06-11 MED ORDER — POTASSIUM CHLORIDE 20 MEQ PO PACK
40.0000 meq | PACK | ORAL | Status: AC
  Administered 2024-06-11 (×3): 40 meq
  Filled 2024-06-11 (×3): qty 2

## 2024-06-11 MED ORDER — DIAZEPAM 5 MG/ML IJ SOLN
INTRAMUSCULAR | Status: AC
  Filled 2024-06-11: qty 2

## 2024-06-11 MED ORDER — POLYETHYLENE GLYCOL 3350 17 G PO PACK
17.0000 g | PACK | Freq: Every day | ORAL | Status: DC
Start: 2024-06-11 — End: 2024-06-13
  Administered 2024-06-12: 17 g
  Filled 2024-06-11: qty 1

## 2024-06-11 MED ORDER — PROPOFOL 1000 MG/100ML IV EMUL
0.0000 ug/kg/min | INTRAVENOUS | Status: DC
  Administered 2024-06-11: 50 ug/kg/min via INTRAVENOUS
  Filled 2024-06-11 (×3): qty 100

## 2024-06-11 MED ORDER — SUCCINYLCHOLINE CHLORIDE 20 MG/ML IJ SOLN
INTRAMUSCULAR | Status: AC | PRN
  Administered 2024-06-11: 100 mg via INTRAVENOUS

## 2024-06-11 MED ORDER — CHLORHEXIDINE GLUCONATE CLOTH 2 % EX PADS
6.0000 | MEDICATED_PAD | Freq: Every day | CUTANEOUS | Status: DC
  Administered 2024-06-11 – 2024-06-15 (×5): 6 via TOPICAL

## 2024-06-11 MED ORDER — SODIUM CHLORIDE 0.9 % IV SOLN: INTRAVENOUS | Status: AC | PRN

## 2024-06-11 MED ORDER — ORAL CARE MOUTH RINSE
15.0000 mL | OROMUCOSAL | Status: DC
  Administered 2024-06-11 – 2024-06-12 (×12): 15 mL via OROMUCOSAL

## 2024-06-11 MED ORDER — ETOMIDATE 2 MG/ML IV SOLN
INTRAVENOUS | Status: AC | PRN
  Administered 2024-06-11: 20 mg via INTRAVENOUS

## 2024-06-11 MED ORDER — DOCUSATE SODIUM 100 MG PO CAPS
100.0000 mg | ORAL_CAPSULE | Freq: Two times a day (BID) | ORAL | Status: DC | PRN
Start: 2024-06-11 — End: 2024-06-11

## 2024-06-11 MED ORDER — INSULIN ASPART 100 UNIT/ML IJ SOLN
0.0000 [IU] | INTRAMUSCULAR | Status: DC
  Administered 2024-06-11 – 2024-06-14 (×4): 2 [IU] via SUBCUTANEOUS

## 2024-06-11 MED ORDER — FENTANYL 2500MCG IN NS 250ML (10MCG/ML) PREMIX INFUSION
0.0000 ug/h | INTRAVENOUS | Status: DC
  Administered 2024-06-11: 50 ug/h via INTRAVENOUS
  Filled 2024-06-11: qty 250

## 2024-06-11 MED ORDER — LEVETIRACETAM (KEPPRA) 500 MG/5 ML ADULT IV PUSH
1500.0000 mg | Freq: Once | INTRAVENOUS | Status: DC
  Administered 2024-06-11: 1500 mg via INTRAVENOUS
  Filled 2024-06-11: qty 15

## 2024-06-11 MED ORDER — FENTANYL CITRATE PF 50 MCG/ML IJ SOSY: 25.0000 ug | PREFILLED_SYRINGE | Freq: Once | INTRAMUSCULAR | Status: AC

## 2024-06-11 MED ORDER — FAMOTIDINE 20 MG PO TABS
20.0000 mg | ORAL_TABLET | Freq: Two times a day (BID) | ORAL | Status: DC
  Administered 2024-06-11 – 2024-06-12 (×3): 20 mg
  Filled 2024-06-11 (×3): qty 1

## 2024-06-11 MED ORDER — FENTANYL CITRATE PF 50 MCG/ML IJ SOSY
PREFILLED_SYRINGE | INTRAMUSCULAR | Status: AC
  Administered 2024-06-11: 50 ug via INTRAVENOUS
  Filled 2024-06-11: qty 1

## 2024-06-11 MED ORDER — ORAL CARE MOUTH RINSE
15.0000 mL | OROMUCOSAL | Status: DC | PRN
Start: 2024-06-11 — End: 2024-06-12

## 2024-06-11 MED ORDER — LACTATED RINGERS IV SOLN: INTRAVENOUS | Status: AC

## 2024-06-11 MED ORDER — ETOMIDATE 2 MG/ML IV SOLN
INTRAVENOUS | Status: AC
  Filled 2024-06-11: qty 10

## 2024-06-11 MED ORDER — LEVETIRACETAM (KEPPRA) 500 MG/5 ML ADULT IV PUSH
1000.0000 mg | Freq: Two times a day (BID) | INTRAVENOUS | Status: DC
  Administered 2024-06-11 – 2024-06-13 (×4): 1000 mg via INTRAVENOUS
  Filled 2024-06-11 (×4): qty 10

## 2024-06-11 MED ORDER — SUCCINYLCHOLINE CHLORIDE 200 MG/10ML IV SOSY
PREFILLED_SYRINGE | INTRAVENOUS | Status: AC
  Filled 2024-06-11: qty 10

## 2024-06-11 MED ORDER — LABETALOL HCL 5 MG/ML IV SOLN
10.0000 mg | INTRAVENOUS | Status: DC | PRN
  Administered 2024-06-11 – 2024-06-13 (×4): 10 mg via INTRAVENOUS
  Filled 2024-06-11 (×3): qty 4

## 2024-06-11 MED ORDER — PROPOFOL 1000 MG/100ML IV EMUL
INTRAVENOUS | Status: AC
  Administered 2024-06-11: 30 ug/kg/min via INTRAVENOUS
  Filled 2024-06-11: qty 100

## 2024-06-11 MED ORDER — POTASSIUM CHLORIDE 10 MEQ/100ML IV SOLN
10.0000 meq | INTRAVENOUS | Status: AC
  Administered 2024-06-11 (×4): 10 meq via INTRAVENOUS
  Filled 2024-06-11 (×4): qty 100

## 2024-06-11 MED ORDER — LABETALOL HCL 5 MG/ML IV SOLN
10.0000 mg | Freq: Once | INTRAVENOUS | Status: AC
  Administered 2024-06-11: 10 mg via INTRAVENOUS
  Filled 2024-06-11: qty 4

## 2024-06-11 MED ORDER — THIAMINE HCL 100 MG/ML IJ SOLN
100.0000 mg | Freq: Every day | INTRAMUSCULAR | Status: DC
  Administered 2024-06-11 – 2024-06-14 (×4): 100 mg via INTRAVENOUS
  Filled 2024-06-11 (×4): qty 2

## 2024-06-11 MED ORDER — DIAZEPAM 5 MG/ML IJ SOLN
10.0000 mg | INTRAMUSCULAR | Status: AC
  Administered 2024-06-11: 10 mg via INTRAVENOUS

## 2024-06-11 MED ORDER — DIAZEPAM 5 MG/ML IJ SOLN
INTRAMUSCULAR | Status: AC | PRN
  Administered 2024-06-11: 10 mg via INTRAVENOUS

## 2024-06-11 MED ORDER — DOCUSATE SODIUM 50 MG/5ML PO LIQD
100.0000 mg | Freq: Two times a day (BID) | ORAL | Status: DC
  Administered 2024-06-11 – 2024-06-12 (×3): 100 mg
  Filled 2024-06-11 (×3): qty 10

## 2024-06-11 MED ORDER — POLYETHYLENE GLYCOL 3350 17 G PO PACK: 17.0000 g | PACK | Freq: Every day | ORAL | Status: DC | PRN

## 2024-06-11 NOTE — Consult Note (Signed)
 NEUROLOGY CONSULT NOTE   Date of service: June 11, 2024 Patient Name: [name] MRN:  [id number] DOB:  24-Feb-1956 Chief Complaint: Status epilepticus Requesting Provider: Ruthe Cornet, DO  History of Present Illness  [name] is a 68 y.o. female with hx of seizures who has been noncompliant with medications in the past who presents with breakthrough seizures.  In the pharmacy fill reports, there has been no activity since April, so my suspicion is that she is noncompliant.  She presented with multiple breakthrough seizures, and had recurrent seizures without return to baseline in the emergency department.  Due to being unable to protect her airway, she was intubated.  Past History   Past Medical History:  Diagnosis Date   Alcohol  dependence (HCC)    Anxiety    Chronic pain    Depression    Diverticulitis    Herniated cervical disc    Pancreatitis    Seizures (HCC)    alcoholic seizures    Past Surgical History:  Procedure Laterality Date   ABDOMINAL HYSTERECTOMY     ANKLE SURGERY Right    CARPAL TUNNEL RELEASE Bilateral    CERVICAL FUSION     CHOLECYSTECTOMY     KNEE SURGERY      Family History: Family History  Family history unknown: Yes    Social History  reports that she has been smoking cigarettes. She has a 1.8 pack-year smoking history. She has never used smokeless tobacco. She reports current alcohol  use. She reports that she does not currently use drugs after having used the following drugs: Marijuana.  Allergies  Allergen Reactions   Keflex  [Cephalexin ] Hives   Cephalosporins Hives   Prednisone  Swelling   Toradol  [Ketorolac  Tromethamine ] Hives   Ultram  [Tramadol ] Hives    Medications   Current Facility-Administered Medications:    diazepam  (VALIUM ) 5 MG/ML injection, , , ,    etomidate  (AMIDATE ) 2 MG/ML injection, , , ,    succinylcholine  (ANECTINE ) 200 MG/10ML syringe, , , ,    diazepam  (VALIUM ) injection 10 mg, 10  mg, Intravenous, STAT, [name], Emily SQUIBB, MD   levETIRAcetam  (KEPPRA ) undiluted injection 1,500 mg, 1,500 mg, Intravenous, Once, Curatolo, Adam, DO   levETIRAcetam  (KEPPRA ) undiluted injection 3,000 mg, 3,000 mg, Intravenous, STAT, Mikhia Dusek, Emily SQUIBB, MD  Current Outpatient Medications:    acetaminophen  (TYLENOL ) 500 MG tablet, Take 1 tablet (500 mg total) by mouth every 6 (six) hours as needed for moderate pain (pain score 4-6)., Disp: 30 tablet, Rfl: 0   cyanocobalamin  1000 MCG tablet, Take 1 tablet (1,000 mcg total) by mouth daily., Disp: 30 tablet, Rfl: 2   DULoxetine  (CYMBALTA ) 30 MG capsule, Take 1 capsule (30 mg total) by mouth daily., Disp: 30 capsule, Rfl: 0   erythromycin  ophthalmic ointment, Place a 1/2 inch ribbon of ointment into the lower eyelid., Disp: 3.5 g, Rfl: 0   folic acid  (FOLVITE ) 1 MG tablet, Take 1 tablet (1 mg total) by mouth daily., Disp: 30 tablet, Rfl: 2   gabapentin  (NEURONTIN ) 300 MG capsule, Take 1 capsule (300 mg total) by mouth 3 (three) times daily., Disp: 90 capsule, Rfl: 0   lacosamide  (VIMPAT ) 50 MG TABS tablet, Take 1 tablet (50 mg total) by mouth 2 (two) times daily., Disp: 60 tablet, Rfl: 0   levETIRAcetam  (KEPPRA ) 500 MG tablet, Take 1 tablet (500 mg total) by mouth 2 (two) times daily., Disp: 60 tablet, Rfl: 0   menthol -cetylpyridinium (CEPACOL) 3 MG lozenge, Take 1 lozenge (3 mg total) by  mouth as needed for sore throat., Disp: 100 tablet, Rfl: 12   methocarbamol  (ROBAXIN ) 500 MG tablet, Take 1 tablet (500 mg total) by mouth 2 (two) times daily., Disp: 20 tablet, Rfl: 0   pantoprazole  (PROTONIX ) 40 MG tablet, Take 1 tablet (40 mg total) by mouth daily., Disp: 30 tablet, Rfl: 0   risperiDONE  (RISPERDAL ) 0.5 MG tablet, Take 1 tablet (0.5 mg total) by mouth 2 (two) times daily., Disp: 60 tablet, Rfl: 0   thiamine  (VITAMIN B1) 100 MG tablet, Take 1 tablet (100 mg total) by mouth daily., Disp: 30 tablet, Rfl: 2  Vitals   Vitals:   21-Jun-2024 0745   BP: (!) 135/106  Pulse: (!) 129  Resp: (!) 25  SpO2: 100%    There is no height or weight on file to calculate BMI.   Physical Exam   Constitutional: Appears well-developed and well-nourished.   Neurologic Examination    Neuro: Mental Status: She does not open eyes or follow commands Cranial Nerves: II: She does not be to threat from either direction III,IV, VI: Eyes were initially midline, however just prior to her seizure she had left gaze with left head turn  V:VII: Blinks to eyelid stimulation bilaterally Motor: She withdrew from noxious stimulation bilaterally  sensory: As above Cerebellar: She does not perform       Labs/Imaging/Neurodiagnostic studies   CBC:  Recent Labs  Lab Jun 21, 2024 0753  HGB 15.3*  HCT 45.0   Basic Metabolic Panel:  Lab Results  Component Value Date   NA 132 (L) 21-Jun-2024   K 7.0 (HH) Jun 21, 2024   CO2 18 (L) 01/17/2024   GLUCOSE 95 01/17/2024   BUN <5 (L) 01/17/2024   CREATININE 0.52 01/17/2024   CALCIUM  9.7 01/17/2024   GFRNONAA >60 01/17/2024   GFRAA >60 06/09/2020   Lipid Panel:  Lab Results  Component Value Date   LDLCALC 58 06/09/2020   HgbA1c:  Lab Results  Component Value Date   HGBA1C 5.0 05/17/2022   Urine Drug Screen:     Component Value Date/Time   LABOPIA NONE DETECTED 01/12/2024 1107   COCAINSCRNUR NONE DETECTED 01/12/2024 1107   LABBENZ POSITIVE (A) 01/12/2024 1107   AMPHETMU NONE DETECTED 01/12/2024 1107   THCU NONE DETECTED 01/12/2024 1107   LABBARB NONE DETECTED 01/12/2024 1107    Alcohol  Level     Component Value Date/Time   ETH <10 01/12/2024 0759   INR  Lab Results  Component Value Date   INR 1.0 05/17/2022   APTT  Lab Results  Component Value Date   APTT 22 (L) 05/17/2022   AED levels:  Lab Results  Component Value Date   PHENYTOIN  <2.5 (L) 10/17/2022   LEVETIRACETA <2.0 (L) 01/12/2024    CT Head without contrast(Personally reviewed): Negative   ASSESSMENT    [name] is a 68 y.o. female with breakthrough seizures presenting as status epilepticus(recurrent seizures without return to baseline) in the setting of suspected noncompliance.  Once she is conscious, we can ask about her medication compliance until then I would continue Keppra .  On my initial assessment, she was withdrawing to noxious stimulation bilaterally, so my suspicion was that she would stop seizing, however she was only tenuously protecting her airway and then subsequently had yet another recurrent seizure and was no longer protecting her airway.  She was therefore intubated and taken for an emergent head CT as well which is negative.  RECOMMENDATIONS  Continue levetiracetam  1 g twice daily Continue EEG monitoring Neurology  will follow ______________________________________________________________________   This patient is critically ill and at significant risk of neurological worsening, death and care requires constant monitoring of vital signs, hemodynamics,respiratory and cardiac monitoring, neurological assessment, discussion with family, other specialists and medical decision making of high complexity. I spent 44 minutes of neurocritical care time  in the care of  this patient. This was time spent independent of any time provided by nurse practitioner or PA.  Emily Seals, MD [facility]   If 7pm- 7am, please page neurology on call as listed in AMION. 06/11/2024  6:16 PM

## 2024-06-11 NOTE — ED Triage Notes (Signed)
 Pt arrives via EMS from home with reported seizures, 1 witnessed by EMS and given 5 mg versed . Hx of seizures. EMS assisting ventilations on arrival.

## 2024-06-11 NOTE — Progress Notes (Signed)
 There was consult for placing PIV access and asking about PICC line. Assessed patient's Rt. Lower arm- too small and deep veins with non compressible vein. It was hard to identified other veins. Rt. Upper arm - very small cephalic vein and nerve bundle was top of brachial vein which was small size for PICC (less then 45% of occupancy); basilic was very small.  Lt. Lower arm- too small and deep veins and unidentified other veins. Lt. Upper arm- basilic was small size for PICC. Only one brachial big enough to put in the PICC, but nerve bundle was located top of the brachial vein then joins the basilic and brachial which is big enough for PICC but location was too close to axillary area which can't put in the PICC. Informed patient's RN regarding this finding. HS [employer]

## 2024-06-11 NOTE — Progress Notes (Signed)
 Arterial Catheter Insertion Procedure Note  [name]  [id number]  [birth year]  Date:06/11/24  Time:12:17 PM    Provider Performing: Maryelizabeth DELENA Sprang    Procedure: Insertion of Arterial Line (63379) without US  guidance  Indication(s) Blood pressure monitoring and/or need for frequent ABGs  Consent Unable to obtain consent due to emergent nature of procedure.  Anesthesia None   Time Out Verified patient identification, verified procedure, site/side was marked, verified correct patient position, special equipment/implants available, medications/allergies/relevant history reviewed, required imaging and test results available.   Sterile Technique Maximal sterile technique including full sterile barrier drape, hand hygiene, sterile gown, sterile gloves, mask, hair covering, sterile ultrasound probe cover (if used).   Procedure Description Area of catheter insertion was cleaned with chlorhexidine  and draped in sterile fashion. Without real-time ultrasound guidance an arterial catheter was placed into the left radial artery.  Appropriate arterial tracings confirmed on monitor.     Complications/Tolerance None; patient tolerated the procedure well.   EBL Minimal   Specimen(s) None

## 2024-06-11 NOTE — Progress Notes (Signed)
 Pt moved to 4n26. [facility] monitoring. Push test button complete.

## 2024-06-11 NOTE — Plan of Care (Signed)
 Pt lethargic/drowsy with sedation off.  EEG in place with no noted seizure activity noted this shift.  VSS.  Remains NPO.  Infiltration to LUE. Otherwise skin intact.

## 2024-06-11 NOTE — H&P (Signed)
 NAME:  [name], MRN:  [id number], DOB:  Mar 07, 1956, LOS: 0 ADMISSION DATE:  06/11/2024, CONSULTATION DATE: 06/11/2024 REFERRING MD:  Ruthe Cornet , CHIEF COMPLAINT: Altered mental status  History of Present Illness:  68 year old female with seizure disorder, noncompliance with medications and alcohol  abuse was brought into the emergency department with altered mental status.  EMS witnessed multiple seizures, she received IM midazolam .  In the emergency department she was sedated, was intubated due to recurrent seizures, neurology was consulted, EEG is being hooked up. Of note patient had previous admission for same thing in the setting of alcohol  abuse and noncompliance with medications  Pertinent  Medical History   Past Medical History:  Diagnosis Date   Alcohol  dependence (HCC)    Anxiety    Chronic pain    Depression    Diverticulitis    Herniated cervical disc    Pancreatitis    Seizures (HCC)    alcoholic seizures     Significant Hospital Events: Including procedures, antibiotic start and stop dates in addition to other pertinent events     Interim History / Subjective:  As above  Objective    Blood pressure (!) 166/98, pulse 82, temperature 97.7 F (36.5 C), resp. rate 18, height 5' 1 (1.549 m), weight 72.6 kg, SpO2 99%.    Vent Mode: PRVC FiO2 (%):  [40 %-100 %] 40 % Set Rate:  [18 bmp] 18 bmp Vt Set:  [380 mL] 380 mL PEEP:  [5 cmH20] 5 cmH20 Plateau Pressure:  [15 cmH20] 15 cmH20  No intake or output data in the 24 hours ending 06/11/24 1046 Filed Weights   06/11/24 0800  Weight: 72.6 kg    Examination: General: Crtitically ill-appearing obese female, orally intubated HEENT: [place]/AT, eyes anicteric.  ETT and cortrak in place Neuro: Opens eyes with vocal stimuli, following simple commands, moving all 4 extremities Chest: Coarse breath sounds, no wheezes or rhonchi Heart: Regular rate and rhythm, no murmurs or gallops Abdomen: Soft,  nondistended, bowel sounds present  Labs and images reviewed  Patient Lines/Drains/Airways Status     Active Line/Drains/Airways     Name Placement date Placement time Site Days   Peripheral IV 06/11/24 20 G Anterior;Distal;Left;Upper Arm 06/11/24  0923  Arm  less than 1   NG/OG Vented/Dual Lumen 16 Fr. Oral 06/11/24  0815  Oral  less than 1   Urethral Catheter Shannon RN Temperature probe 14 Fr. 06/11/24  0826  Temperature probe  less than 1   Airway 7.5 mm 06/11/24  0810  -- less than 1   Intraosseous Line 06/11/24 06/11/24  0800  --  less than 1        Resolved problem list   Assessment and Plan  Status epilepticus with prior history of seizure disorder, likely due to noncompliance with medications Alcohol  abuse disorder Obesity Acute respiratory failure with hypoxia  Neurology is following Patient was loaded with Keppra  Continue Keppra  twice daily Continue video EEG Watch for signs of withdrawal Continue thiamine  and IV fluid Diet and exercise counseling as appropriate Continue lung protective ventilation VAP prevention bundle in place PAD protocol with propofol  and fentanyl  with RASS goal -2  Labs   CBC: Recent Labs  Lab 06/11/24 0753 06/11/24 0817 06/11/24 0845 06/11/24 0912  WBC  --   --  20.2*  --   NEUTROABS  --   --  18.6*  --   HGB 15.3* 18.4* 14.1 14.6  HCT 45.0 54.0* 43.1 43.0  MCV  --   --  107.8*  --   PLT  --   --  278  --     Basic Metabolic Panel: Recent Labs  Lab 06/11/24 0753 06/11/24 0817 06/11/24 0912  NA 132* 136 139  K 7.0* 5.2* 2.2*  CL  --  103  --   GLUCOSE  --  257*  --   BUN  --  <3*  --   CREATININE  --  0.70  --    GFR: Estimated Creatinine Clearance: 61.3 mL/min (by C-G formula based on SCr of 0.7 mg/dL). Recent Labs  Lab 06/11/24 0753 06/11/24 0845  WBC  --  20.2*  LATICACIDVEN 9.6*  --     Liver Function Tests: No results for input(s): AST, ALT, ALKPHOS, BILITOT, PROT, ALBUMIN in the last 168  hours. No results for input(s): LIPASE, AMYLASE in the last 168 hours. Recent Labs  Lab 06/11/24 0833  AMMONIA 34    ABG    Component Value Date/Time   PHART 7.356 06/11/2024 0912   PCO2ART 43.6 06/11/2024 0912   PO2ART 204 (H) 06/11/2024 0912   HCO3 24.6 06/11/2024 0912   TCO2 26 06/11/2024 0912   ACIDBASEDEF 1.0 06/11/2024 0912   O2SAT 100 06/11/2024 0912     Coagulation Profile: No results for input(s): INR, PROTIME in the last 168 hours.  Cardiac Enzymes: No results for input(s): CKTOTAL, CKMB, CKMBINDEX, TROPONINI in the last 168 hours.  HbA1C: Hgb A1c MFr Bld  Date/Time Value Ref Range Status  06/11/2024 09:23 AM 4.5 (L) 4.8 - 5.6 % Final    Comment:    (NOTE) Diagnosis of Diabetes The following HbA1c ranges recommended by the American Diabetes Association (ADA) may be used as an aid in the diagnosis of diabetes mellitus.  Hemoglobin             Suggested A1C NGSP%              Diagnosis  <5.7                   Non Diabetic  5.7-6.4                Pre-Diabetic  >6.4                   Diabetic  <7.0                   Glycemic control for                       adults with diabetes.    05/17/2022 03:38 PM 5.0 4.8 - 5.6 % Final    Comment:    (NOTE) Pre diabetes:          5.7%-6.4%  Diabetes:              >6.4%  Glycemic control for   <7.0% adults with diabetes     CBG: Recent Labs  Lab 06/11/24 0737  GLUCAP 249*    Review of Systems:   Unable to obtain as patient is intubated and sedated  Past Medical History:  She,  has a past medical history of Alcohol  dependence (HCC), Anxiety, Chronic pain, Depression, Diverticulitis, Herniated cervical disc, Pancreatitis, and Seizures (HCC).   Surgical History:   Past Surgical History:  Procedure Laterality Date   ABDOMINAL HYSTERECTOMY     ANKLE SURGERY Right    CARPAL TUNNEL RELEASE Bilateral    CERVICAL FUSION     CHOLECYSTECTOMY  KNEE SURGERY       Social History:    reports that she has been smoking cigarettes. She has a 1.8 pack-year smoking history. She has never used smokeless tobacco. She reports current alcohol  use. She reports that she does not currently use drugs after having used the following drugs: Marijuana.   Family History:  Her Family history is unknown by patient.   Allergies Allergies  Allergen Reactions   Keflex  [Cephalexin ] Hives   Cephalosporins Hives   Prednisone  Swelling   Toradol  [Ketorolac  Tromethamine ] Hives   Ultram  [Tramadol ] Hives     Home Medications  Prior to Admission medications   Medication Sig Start Date End Date Taking? Authorizing Provider  acetaminophen  (TYLENOL ) 500 MG tablet Take 1 tablet (500 mg total) by mouth every 6 (six) hours as needed for moderate pain (pain score 4-6). 04/01/24   Charlyn Sora, MD  cyanocobalamin  1000 MCG tablet Take 1 tablet (1,000 mcg total) by mouth daily. 09/28/23   Dennise Lavada POUR, MD  DULoxetine  (CYMBALTA ) 30 MG capsule Take 1 capsule (30 mg total) by mouth daily. 01/20/24 02/19/24  Vernon Ranks, MD  erythromycin  ophthalmic ointment Place a 1/2 Harrison ribbon of ointment into the lower eyelid. 04/01/24   Charlyn Sora, MD  folic acid  (FOLVITE ) 1 MG tablet Take 1 tablet (1 mg total) by mouth daily. 09/28/23   Singh, Prashant K, MD  gabapentin  (NEURONTIN ) 300 MG capsule Take 1 capsule (300 mg total) by mouth 3 (three) times daily. 10/23/23   Dean Clarity, MD  lacosamide  (VIMPAT ) 50 MG TABS tablet Take 1 tablet (50 mg total) by mouth 2 (two) times daily. 01/20/24   Vernon Ranks, MD  levETIRAcetam  (KEPPRA ) 500 MG tablet Take 1 tablet (500 mg total) by mouth 2 (two) times daily. 01/20/24 02/19/24  Vernon Ranks, MD  menthol -cetylpyridinium (CEPACOL) 3 MG lozenge Take 1 lozenge (3 mg total) by mouth as needed for sore throat. 09/28/23   Singh, Prashant K, MD  methocarbamol  (ROBAXIN ) 500 MG tablet Take 1 tablet (500 mg total) by mouth 2 (two) times daily. 10/23/23   Dean Clarity, MD   pantoprazole  (PROTONIX ) 40 MG tablet Take 1 tablet (40 mg total) by mouth daily. 01/20/24 03/20/24  Vernon Ranks, MD  risperiDONE  (RISPERDAL ) 0.5 MG tablet Take 1 tablet (0.5 mg total) by mouth 2 (two) times daily. 01/20/24 02/19/24  Vernon Ranks, MD  thiamine  (VITAMIN B1) 100 MG tablet Take 1 tablet (100 mg total) by mouth daily. 09/28/23   Singh, Prashant K, MD  metoCLOPramide  (REGLAN ) 10 MG tablet Take 1 tablet (10 mg total) by mouth every 6 (six) hours as needed for nausea (nausea/headache). 04/06/19 05/24/19  Mesner, Selinda, MD     Critical care time:      The patient is critically ill due to status epilepticus.  Critical care was necessary to treat or prevent imminent or life-threatening deterioration.  Critical care was time spent personally by me on the following activities: development of treatment plan with patient and/or surrogate as well as nursing, discussions with consultants, evaluation of patient's response to treatment, examination of patient, obtaining history from patient or surrogate, ordering and performing treatments and interventions, ordering and review of laboratory studies, ordering and review of radiographic studies, pulse oximetry, re-evaluation of patient's condition and participation in multidisciplinary rounds.   During this encounter critical care time was devoted to patient care services described in this note for 42 minutes.     Valinda Novas, MD [facility] See Amion for pager  If no response to pager, please call [phone] until 7pm After 7pm, Please call E-link [phone]

## 2024-06-11 NOTE — ED Provider Notes (Signed)
 [place] EMERGENCY DEPARTMENT AT [facility] Provider Note   CSN: [id number] Arrival date & time: 06/11/24  9268     Patient presents with: Seizures   [name] is a 68 y.o. female.   Level 5 caveat due to patient with altered mental status.  Requiring nonrebreather and being assisted with ventilations by EMS.  Multiple seizures prior to EMS arrival.  Had a seizure with EMS.  Was given IM Versed .  She is breathing on her own but sedated.  Oxygen is 100% but with BVM.  Sounds like she was admitted for something similar.  History of noncompliance.  Picked up at a hotel.  Possibly alcohol  use.  The history is provided by the EMS personnel.       Prior to Admission medications   Medication Sig Start Date End Date Taking? Authorizing Provider  acetaminophen  (TYLENOL ) 500 MG tablet Take 1 tablet (500 mg total) by mouth every 6 (six) hours as needed for moderate pain (pain score 4-6). 04/01/24   Charlyn Sora, MD  cyanocobalamin  1000 MCG tablet Take 1 tablet (1,000 mcg total) by mouth daily. 09/28/23   Singh, Prashant K, MD  DULoxetine  (CYMBALTA ) 30 MG capsule Take 1 capsule (30 mg total) by mouth daily. 01/20/24 02/19/24  Vernon Ranks, MD  erythromycin  ophthalmic ointment Place a 1/2 inch ribbon of ointment into the lower eyelid. 04/01/24   Charlyn Sora, MD  folic acid  (FOLVITE ) 1 MG tablet Take 1 tablet (1 mg total) by mouth daily. 09/28/23   Singh, Prashant K, MD  gabapentin  (NEURONTIN ) 300 MG capsule Take 1 capsule (300 mg total) by mouth 3 (three) times daily. 10/23/23   Dean Clarity, MD  lacosamide  (VIMPAT ) 50 MG TABS tablet Take 1 tablet (50 mg total) by mouth 2 (two) times daily. 01/20/24   Vernon Ranks, MD  levETIRAcetam  (KEPPRA ) 500 MG tablet Take 1 tablet (500 mg total) by mouth 2 (two) times daily. 01/20/24 02/19/24  Vernon Ranks, MD  menthol -cetylpyridinium (CEPACOL) 3 MG lozenge Take 1 lozenge (3 mg total) by mouth as needed for sore throat. 09/28/23    Singh, Prashant K, MD  methocarbamol  (ROBAXIN ) 500 MG tablet Take 1 tablet (500 mg total) by mouth 2 (two) times daily. 10/23/23   Haviland, Julie, MD  pantoprazole  (PROTONIX ) 40 MG tablet Take 1 tablet (40 mg total) by mouth daily. 01/20/24 03/20/24  Vernon Ranks, MD  risperiDONE  (RISPERDAL ) 0.5 MG tablet Take 1 tablet (0.5 mg total) by mouth 2 (two) times daily. 01/20/24 02/19/24  Vernon Ranks, MD  thiamine  (VITAMIN B1) 100 MG tablet Take 1 tablet (100 mg total) by mouth daily. 09/28/23   Singh, Prashant K, MD  metoCLOPramide  (REGLAN ) 10 MG tablet Take 1 tablet (10 mg total) by mouth every 6 (six) hours as needed for nausea (nausea/headache). 04/06/19 05/24/19  Mesner, Selinda, MD    Allergies: Keflex  [cephalexin ], Cephalosporins, Prednisone , Toradol  [ketorolac  tromethamine ], and Ultram  [tramadol ]    Review of Systems  Updated Vital Signs BP (!) 188/105   Pulse 98   Temp (!) 97.4 F (36.3 C)   Resp 20   Ht 5' 1 (1.549 m)   Wt 72.6 kg Comment: From 2 months ago - please reweigh when able  SpO2 100%   BMI 30.24 kg/m   Physical Exam Vitals and nursing note reviewed.  Constitutional:      General: She is in acute distress.     Appearance: She is well-developed. She is ill-appearing.     Comments: Sedated  HENT:     Head: Normocephalic and atraumatic.  Eyes:     Extraocular Movements: Extraocular movements intact.     Conjunctiva/sclera: Conjunctivae normal.     Pupils: Pupils are equal, round, and reactive to light.     Comments: Pupils are equal and reactive, no gaze preference  Cardiovascular:     Rate and Rhythm: Normal rate and regular rhythm.     Heart sounds: No murmur heard. Pulmonary:     Comments: Coarse breath sounds throughout with BVM Abdominal:     Palpations: Abdomen is soft.     Tenderness: There is no abdominal tenderness.  Musculoskeletal:        General: No swelling.     Cervical back: Neck supple.  Skin:    General: Skin is warm and dry.     Capillary Refill:  Capillary refill takes less than 2 seconds.  Neurological:     Comments: She does move extremities to painful stimuli, opening eyes spontaneously but sedated cannot follow commands  Psychiatric:        Mood and Affect: Mood normal.     (all labs ordered are listed, but only abnormal results are displayed) Labs Reviewed  CBC WITH DIFFERENTIAL/PLATELET - Abnormal; Notable for the following components:      Result Value   WBC 20.2 (*)    MCV 107.8 (*)    MCH 35.3 (*)    Neutro Abs 18.6 (*)    Lymphs Abs 0.6 (*)    Abs Immature Granulocytes 0.24 (*)    All other components within normal limits  CBG MONITORING, ED - Abnormal; Notable for the following components:   Glucose-Capillary 249 (*)    All other components within normal limits  I-STAT VENOUS BLOOD GAS, ED - Abnormal; Notable for the following components:   pCO2, Ven 38.9 (*)    pO2, Ven 91 (*)    TCO2 21 (*)    Acid-base deficit 5.0 (*)    Sodium 132 (*)    Potassium 7.0 (*)    Calcium , Ion 0.93 (*)    Hemoglobin 15.3 (*)    All other components within normal limits  I-STAT CG4 LACTIC ACID, ED - Abnormal; Notable for the following components:   Lactic Acid, Venous 9.6 (*)    All other components within normal limits  I-STAT CHEM 8, ED - Abnormal; Notable for the following components:   Potassium 5.2 (*)    BUN <3 (*)    Glucose, Bld 257 (*)    Calcium , Ion 1.07 (*)    TCO2 21 (*)    Hemoglobin 18.4 (*)    HCT 54.0 (*)    All other components within normal limits  I-STAT ARTERIAL BLOOD GAS, ED - Abnormal; Notable for the following components:   pO2, Arterial 204 (*)    Potassium 2.2 (*)    All other components within normal limits  MRSA NEXT GEN BY PCR, NASAL  ETHANOL  CBC WITH DIFFERENTIAL/PLATELET  LEVETIRACETAM  LEVEL  LACOSAMIDE   CBC WITH DIFFERENTIAL/PLATELET  RAPID URINE DRUG SCREEN, HOSP PERFORMED  BLOOD GAS, ARTERIAL  AMMONIA  LACTIC ACID, PLASMA  LACTIC ACID, PLASMA  COMPREHENSIVE METABOLIC PANEL  WITH GFR  HIV ANTIBODY (ROUTINE TESTING W REFLEX)  HEMOGLOBIN A1C    EKG: EKG Interpretation Date/Time:  Sunday June 11 2024 07:43:45 EDT Ventricular Rate:  128 PR Interval:    QRS Duration:  91 QT Interval:  347 QTC Calculation: 507 R Axis:   -85  Text Interpretation: sinus tachycardia  with artifact Confirmed by Ruthe Cornet [phone]) on 06/11/2024 8:23:08 AM  Radiology: No results found.   .Critical Care  Performed by: Ruthe Cornet, DO Authorized by: Ruthe Cornet, DO   Critical care provider statement:    Critical care time (minutes):  40   Critical care was necessary to treat or prevent imminent or life-threatening deterioration of the following conditions:  CNS failure or compromise   Critical care was time spent personally by me on the following activities:  Blood draw for specimens, development of treatment plan with patient or surrogate, discussions with consultants, discussions with primary provider, evaluation of patient's response to treatment, examination of patient, obtaining history from patient or surrogate, ordering and performing treatments and interventions, ordering and review of radiographic studies, ordering and review of laboratory studies, pulse oximetry, re-evaluation of patient's condition and review of old charts   Care discussed with: admitting provider      Medications Ordered in the ED  levETIRAcetam  (KEPPRA ) undiluted injection 1,500 mg (has no administration in time range)  diazepam  (VALIUM ) injection 10 mg (has no administration in time range)  diazepam  (VALIUM ) 5 MG/ML injection (has no administration in time range)  succinylcholine  (ANECTINE ) 200 MG/10ML syringe (has no administration in time range)  etomidate  (AMIDATE ) 2 MG/ML injection (has no administration in time range)  diazepam  (VALIUM ) injection (10 mg Intravenous Given 06/11/24 0758)  etomidate  (AMIDATE ) injection (20 mg Intravenous Given 06/11/24 0759)  succinylcholine  (ANECTINE )  injection (100 mg Intravenous Given 06/11/24 0800)  propofol  (DIPRIVAN ) 1000 MG/100ML infusion (30 mcg/kg/min  72.6 kg Intravenous New Bag/Given 06/11/24 0923)  Chlorhexidine  Gluconate Cloth 2 % PADS 6 each (has no administration in time range)  polyethylene glycol (MIRALAX  / GLYCOLAX ) packet 17 g (has no administration in time range)  enoxaparin  (LOVENOX ) injection 40 mg (has no administration in time range)  famotidine  (PEPCID ) tablet 20 mg (has no administration in time range)  lactated ringers  infusion ( Intravenous New Bag/Given 06/11/24 0924)  insulin  aspart (novoLOG ) injection 0-15 Units (has no administration in time range)  docusate (COLACE) 50 MG/5ML liquid 100 mg (has no administration in time range)  polyethylene glycol (MIRALAX  / GLYCOLAX ) packet 17 g (has no administration in time range)  fentaNYL  (SUBLIMAZE ) injection 25-50 mcg (has no administration in time range)  fentaNYL  in NS (62mcg/ml) infusion-PREMIX (has no administration in time range)  fentaNYL  (SUBLIMAZE ) bolus via infusion 25-100 mcg (has no administration in time range)  levETIRAcetam  (KEPPRA ) undiluted injection 3,000 mg (3,000 mg Intravenous Given 06/11/24 0807)  fentaNYL  (SUBLIMAZE ) 50 MCG/ML injection (50 mcg  Given 06/11/24 9085)                                    Medical Decision Making Amount and/or Complexity of Data Reviewed Labs: ordered. Radiology: ordered.  Risk Prescription drug management. Decision regarding hospitalization.   [name] is here unresponsive following seizure activities per EMS.  She arrives breathing on her own maintaining oxygen saturations but she is heavily sedated/postictal.  Looks like she bit her tongue.  Sounds like she has several seizures with report per EMS per witness.  Had 1 with them was given 5 of IM Versed .  I talked with Dr. Michaela with neurology.  Was given her some time to see if she would come around but she had another seizure when  neurology arrived therefore decision was made to intubate her for airway protection given that  now she needs to be further sedated.  On my attempt to intubate difficulty seeing airway.  She was given etomidate  and succinylcholine .  I asked my colleague Dr. Neysa to come in for assistance to help with cricoid pressure and airway positioning and eventually he was able to get her intubated.  She had an anterior airway with a large tongue but overall she was able to be intubated.  She did not have any prolonged hypoxia during intubation.  Recommend good airway positioning and having alternative techniques available especially bougie.  Ultimately have talked with ICU about admission given status epilepticus now intubated.  Will load her with Keppra  and Vimpat .  Will sedate with propofol .  Does seem like she is no longer seizing.  Lab work and CT head pending.  Per my review and interpretation of labs white count is 20.  Hemoglobin 14.  Blood gas shows a pH of 7.3.  Potassium is 5.2.  Lactic acid elevated likely secondary to seizures.  Head CT with no obvious head bleed per my review interpretation of head CT.  Head CT chest x-ray pending radiology report.  Remaining blood work pending.  ICU team to admit.  Patient had an IO placed by nursing staff.  I was able to get an ultrasound IV.  Patient seems to be mentating little bit better.  She is getting set up for continuous EEG.  To be admitted to ICU team for status epilepticus.  This chart was dictated using voice recognition software.  Despite best efforts to proofread,  errors can occur which can change the documentation meaning.      Final diagnoses:  Status epilepticus (HCC)  Acute respiratory failure with hypoxia [facility])    ED Discharge Orders     None          Ruthe Cornet, DO 06/11/24 9072

## 2024-06-11 NOTE — Progress Notes (Signed)
LTM EEG hooked up and running - no initial skin breakdown - push button tested - [facility] NOT monitoring. Pt is in ED 

## 2024-06-11 NOTE — ED Provider Notes (Signed)
 Procedure Name: Intubation Date/Time: 06/11/2024 8:13 AM  Performed by: Neysa Caron PARAS, DOPre-anesthesia Checklist: Patient identified, Patient being monitored, Emergency Drugs available, Timeout performed and Suction available Oxygen Delivery Method: Non-rebreather mask Preoxygenation: Pre-oxygenation with 100% oxygen Induction Type: Rapid sequence Ventilation: Mask ventilation without difficulty Laryngoscope Size: Glidescope and 3 Grade View: Grade IV Tube size: 7.5 mm Number of attempts: 2 (Colleague attempted intubation prior to my successful intubation) Airway Equipment and Method: Video-laryngoscopy Placement Confirmation: ETT inserted through vocal cords under direct vision, CO2 detector and Breath sounds checked- equal and bilateral Secured at: 24 cm Tube secured with: ETT holder Difficulty Due To: Difficult Airway- due to large tongue, Difficult Airway- due to reduced neck mobility, Difficult Airway- due to anterior larynx and Difficult Airway- due to limited oral opening Future Recommendations: Recommend- induction with short-acting agent, and alternative techniques readily available        Neysa Caron PARAS, DO 06/11/24 [id number]

## 2024-06-11 NOTE — Progress Notes (Signed)
 Date and time results received: 06/11/24 1300  Test: K+ and Lactic Acid Critical Value: 2.4 K+, and 2.7 Lactic Acid  Name of Provider Notified: Dr. Valinda Novas, CCM  Orders Received? Or Actions Taken?: Notified provider, awaiting new orders for electrolyte replacement at this time.  Elleanor Guyett, RN

## 2024-06-11 NOTE — Progress Notes (Signed)
 Patient transported from ED Trauma C to 4N26 with no complication noted.

## 2024-06-11 NOTE — Progress Notes (Signed)
 Pt has infiltration in left upper arm where propofol  and fentanyl  was infusing.  The propofol  is visible under the skin.  Notified pharmacy who states, no antidote for propofol  extravasion.  Could possibly have a lingering effect  Site marked, elevated, and warm compress applied.  Notified CCM and neuro of findings and pharmacist recs.  After moving sedation to working iv, pressures dropped and sedation has since been weaned off.  Notified CCM and neuro of this as well.  No further orders or instructions given at this time.

## 2024-06-11 NOTE — Progress Notes (Signed)
 Transported from the ED to CT and back to ED without complications

## 2024-06-12 ENCOUNTER — Inpatient Hospital Stay (HOSPITAL_COMMUNITY)

## 2024-06-12 DIAGNOSIS — F32A Depression, unspecified: Secondary | ICD-10-CM

## 2024-06-12 DIAGNOSIS — F1090 Alcohol use, unspecified, uncomplicated: Secondary | ICD-10-CM | POA: Diagnosis not present

## 2024-06-12 DIAGNOSIS — G40909 Epilepsy, unspecified, not intractable, without status epilepticus: Secondary | ICD-10-CM | POA: Diagnosis not present

## 2024-06-12 DIAGNOSIS — G40901 Epilepsy, unspecified, not intractable, with status epilepticus: Secondary | ICD-10-CM | POA: Diagnosis not present

## 2024-06-12 DIAGNOSIS — Z91148 Patient's other noncompliance with medication regimen for other reason: Secondary | ICD-10-CM | POA: Diagnosis not present

## 2024-06-12 DIAGNOSIS — J9601 Acute respiratory failure with hypoxia: Secondary | ICD-10-CM | POA: Diagnosis not present

## 2024-06-12 LAB — CBC
HCT: 36.8 % (ref 36.0–46.0)
Hemoglobin: 12.1 g/dL (ref 12.0–15.0)
MCH: 35.3 pg — ABNORMAL HIGH (ref 26.0–34.0)
MCHC: 32.9 g/dL (ref 30.0–36.0)
MCV: 107.3 fL — ABNORMAL HIGH (ref 80.0–100.0)
Platelets: 246 K/uL (ref 150–400)
RBC: 3.43 MIL/uL — ABNORMAL LOW (ref 3.87–5.11)
RDW: 13.7 % (ref 11.5–15.5)
WBC: 11.7 K/uL — ABNORMAL HIGH (ref 4.0–10.5)
nRBC: 0 % (ref 0.0–0.2)

## 2024-06-12 LAB — GLUCOSE, CAPILLARY
Glucose-Capillary: 105 mg/dL — ABNORMAL HIGH (ref 70–99)
Glucose-Capillary: 111 mg/dL — ABNORMAL HIGH (ref 70–99)
Glucose-Capillary: 135 mg/dL — ABNORMAL HIGH (ref 70–99)
Glucose-Capillary: 90 mg/dL (ref 70–99)
Glucose-Capillary: 90 mg/dL (ref 70–99)
Glucose-Capillary: 93 mg/dL (ref 70–99)
Glucose-Capillary: 95 mg/dL (ref 70–99)

## 2024-06-12 LAB — BASIC METABOLIC PANEL WITH GFR
Anion gap: 7 (ref 5–15)
BUN: 6 mg/dL — ABNORMAL LOW (ref 8–23)
CO2: 23 mmol/L (ref 22–32)
Calcium: 8.3 mg/dL — ABNORMAL LOW (ref 8.9–10.3)
Chloride: 109 mmol/L (ref 98–111)
Creatinine, Ser: 0.81 mg/dL (ref 0.44–1.00)
GFR, Estimated: >60 mL/min (ref >60)
Glucose, Bld: 103 mg/dL — ABNORMAL HIGH (ref 70–99)
Potassium: 4.3 mmol/L (ref 3.5–5.1)
Sodium: 139 mmol/L (ref 135–145)

## 2024-06-12 LAB — POCT I-STAT 7, (LYTES, BLD GAS, ICA,H+H)
Acid-Base Excess: 2 mmol/L (ref 0.0–2.0)
Bicarbonate: 26.4 mmol/L (ref 20.0–28.0)
Calcium, Ion: 1.16 mmol/L (ref 1.15–1.40)
HCT: 36 % (ref 36.0–46.0)
Hemoglobin: 12.2 g/dL (ref 12.0–15.0)
O2 Saturation: 97 %
Patient temperature: 37.8
Potassium: 4.4 mmol/L (ref 3.5–5.1)
Sodium: 138 mmol/L (ref 135–145)
TCO2: 28 mmol/L (ref 22–32)
pCO2 arterial: 42.9 mmHg (ref 32–48)
pH, Arterial: 7.402 (ref 7.35–7.45)
pO2, Arterial: 96 mmHg (ref 83–108)

## 2024-06-12 LAB — EXPECTORATED SPUTUM ASSESSMENT W GRAM STAIN, RFLX TO RESP C

## 2024-06-12 LAB — MRSA NEXT GEN BY PCR, NASAL: MRSA by PCR Next Gen: DETECTED — AB

## 2024-06-12 LAB — MAGNESIUM: Magnesium: 1.4 mg/dL — ABNORMAL LOW (ref 1.7–2.4)

## 2024-06-12 LAB — PHOSPHORUS: Phosphorus: 2.1 mg/dL — ABNORMAL LOW (ref 2.5–4.6)

## 2024-06-12 MED ORDER — SODIUM CHLORIDE 0.9 % IV SOLN
50.0000 mg | Freq: Two times a day (BID) | INTRAVENOUS | Status: DC
  Administered 2024-06-12 – 2024-06-13 (×2): 50 mg via INTRAVENOUS
  Filled 2024-06-12 (×3): qty 5

## 2024-06-12 MED ORDER — DEXMEDETOMIDINE HCL IN NACL 400 MCG/100ML IV SOLN
0.0000 ug/kg/h | INTRAVENOUS | Status: DC
  Filled 2024-06-12: qty 100

## 2024-06-12 MED ORDER — LACOSAMIDE 50 MG PO TABS
50.0000 mg | ORAL_TABLET | Freq: Two times a day (BID) | ORAL | Status: DC
  Administered 2024-06-12: 50 mg
  Filled 2024-06-12: qty 1

## 2024-06-12 MED ORDER — SODIUM PHOSPHATES 45 MMOLE/15ML IV SOLN
15.0000 mmol | Freq: Once | INTRAVENOUS | Status: AC
  Administered 2024-06-12: 15 mmol via INTRAVENOUS
  Filled 2024-06-12: qty 5

## 2024-06-12 MED ORDER — MAGNESIUM SULFATE 4 GM/100ML IV SOLN
4.0000 g | Freq: Once | INTRAVENOUS | Status: AC
  Administered 2024-06-12: 4 g via INTRAVENOUS
  Filled 2024-06-12: qty 100

## 2024-06-12 MED ORDER — SODIUM CHLORIDE 0.9 % IV SOLN
100.0000 mg | Freq: Two times a day (BID) | INTRAVENOUS | Status: DC
  Administered 2024-06-12 – 2024-06-14 (×4): 100 mg via INTRAVENOUS
  Filled 2024-06-12 (×5): qty 100

## 2024-06-12 MED ORDER — MUPIROCIN 2 % EX OINT
1.0000 | TOPICAL_OINTMENT | Freq: Two times a day (BID) | CUTANEOUS | Status: DC
  Administered 2024-06-12 – 2024-06-15 (×7): 1 via NASAL
  Filled 2024-06-12 (×3): qty 22

## 2024-06-12 MED ORDER — LACOSAMIDE 50 MG PO TABS: 50.0000 mg | ORAL_TABLET | Freq: Two times a day (BID) | ORAL | Status: DC

## 2024-06-12 MED ORDER — ORAL CARE MOUTH RINSE
15.0000 mL | OROMUCOSAL | Status: DC | PRN
Start: 2024-06-12 — End: 2024-06-15

## 2024-06-12 NOTE — Progress Notes (Signed)
 LTM maint complete - no skin breakdown under: Fp1 Fp2 Fpz Serviced fore mention leads. [facility] monitored, Event button test confirmed by [facility].

## 2024-06-12 NOTE — Evaluation (Signed)
 Physical Therapy Evaluation Patient Details Name: [name] MRN: [id number] DOB: 07/26/1956 Today's Date: 06/12/2024  History of Present Illness  25 yoF w/ PMH presented 06/11/24 with AMS.  Witnessed multiple seizures.  Pt intubated 8/24-8/25 for airway protection and O2 desaturations. Continuous EEG placed; PMH: Seizure disorder, ETOH withdrawal, pancreatitis, anxiety, depression and SAH.  Clinical Impression   Pt admitted secondary to problem above with deficits below. Patient's prior functional status, home set-up, and caregiver support currently unknown (pt unreliable and no good phone number per RN). Pt currently requires min assist for bed mobility and standing at EOB. Too many lines to ambulate +1, although anticipate she will soon be able to do so with assist. Discharge planning to be assessed as pt less confused and able to provide information or someone familiar with her can be located.  Anticipate patient will benefit from PT to address problems listed below. Will continue to follow acutely to maximize functional mobility, independence, and safety.           If plan is discharge home, recommend the following: A little help with walking and/or transfers;A little help with bathing/dressing/bathroom;Assistance with cooking/housework;Direct supervision/assist for medications management;Direct supervision/assist for financial management;Assist for transportation;Help with stairs or ramp for entrance;Supervision due to cognitive status   Can travel by private vehicle        Equipment Recommendations Rolling walker (2 wheels) (will continue to assess)  Recommendations for Other Services       Functional Status Assessment Patient has had a recent decline in their functional status and demonstrates the ability to make significant improvements in function in a reasonable and predictable amount of time.     Precautions / Restrictions Precautions Precautions: Fall;Other  (comment) Recall of Precautions/Restrictions: Impaired Precaution/Restrictions Comments: seizure; peripheral IV L foot; aline LUE, EEG      Mobility  Bed Mobility Overal bed mobility: Needs Assistance Bed Mobility: Supine to Sit, Sit to Supine     Supine to sit: Min assist, HOB elevated Sit to supine: Min assist   General bed mobility comments: assist to scoot to EOB once upright; assist to guide torso and raise legs to get back in bed    Transfers Overall transfer level: Needs assistance Equipment used: 1 person hand held assist Transfers: Sit to/from Stand Sit to Stand: Min assist, From elevated surface           General transfer comment: From ICU bed at lowest height; steadying assist    Ambulation/Gait               General Gait Details: unable due to multiple lines (including EEG, IV in foot, 2 IV poles)  Stairs            Wheelchair Mobility     Tilt Bed    Modified Rankin (Stroke Patients Only)       Balance Overall balance assessment: Needs assistance Sitting-balance support: No upper extremity supported, Feet unsupported Sitting balance-Leahy Scale: Fair     Standing balance support: Bilateral upper extremity supported Standing balance-Leahy Scale: Poor                               Pertinent Vitals/Pain Pain Assessment Pain Assessment: No/denies pain    Home Living Family/patient expects to be discharged to:: Unsure                   Additional Comments: pt confused and unable to state  her home setup    Prior Function Prior Level of Function : Patient poor historian/Family not available                     Extremity/Trunk Assessment   Upper Extremity Assessment Upper Extremity Assessment: LUE deficits/detail (limited use due to a-line and specialty line Lt shoulder)    Lower Extremity Assessment Lower Extremity Assessment: Overall WFL for tasks assessed    Cervical / Trunk  Assessment Cervical / Trunk Assessment: Normal  Communication   Communication Communication: No apparent difficulties    Cognition Arousal: Alert Behavior During Therapy: Flat affect   PT - Cognitive impairments: No family/caregiver present to determine baseline, Orientation   Orientation impairments: Person, Time, Situation (could not state last name)                     Following commands: Impaired Following commands impaired: Follows one step commands with increased time, Follows one step commands inconsistently     Cueing Cueing Techniques: Verbal cues, Gestural cues, Tactile cues     General Comments      Exercises     Assessment/Plan    PT Assessment Patient needs continued PT services  PT Problem List Decreased balance;Decreased mobility;Decreased cognition;Decreased knowledge of use of DME;Decreased safety awareness       PT Treatment Interventions DME instruction;Gait training;Stair training;Therapeutic activities;Functional mobility training;Therapeutic exercise;Balance training;Cognitive remediation;Patient/family education    PT Goals (Current goals can be found in the Care Plan section)  Acute Rehab PT Goals Patient Stated Goal: Unable to state PT Goal Formulation: Patient unable to participate in goal setting Time For Goal Achievement: 06/26/24 Potential to Achieve Goals: Good    Frequency Min 2X/week     Co-evaluation               AM-PAC PT 6 Clicks Mobility  Outcome Measure Help needed turning from your back to your side while in a flat bed without using bedrails?: A Little Help needed moving from lying on your back to sitting on the side of a flat bed without using bedrails?: A Little Help needed moving to and from a bed to a chair (including a wheelchair)?: A Little Help needed standing up from a chair using your arms (e.g., wheelchair or bedside chair)?: A Little Help needed to walk in hospital room?: Total Help needed  climbing 3-5 steps with a railing? : Total 6 Click Score: 14    End of Session Equipment Utilized During Treatment: Oxygen Activity Tolerance: Patient tolerated treatment well Patient left: in bed;with call bell/phone within reach (in chair position) Nurse Communication: Mobility status;Other (comment) (?family/support, home setup) PT Visit Diagnosis: Unsteadiness on feet (R26.81);Other symptoms and signs involving the nervous system (R29.898)    Time: 1340-1408 PT Time Calculation (min) (ACUTE ONLY): 28 min   Charges:   PT Evaluation $PT Eval Low Complexity: 1 Low PT Treatments $Therapeutic Activity: 8-22 mins PT General Charges $$ ACUTE PT VISIT: 1 Visit          Macario RAMAN, PT Acute Rehabilitation Services  Office [phone]   Macario SHAUNNA Soja 06/12/2024, 2:28 PM

## 2024-06-12 NOTE — Progress Notes (Signed)
 Pt's urine output 110 ml  over last 6 hr.  Notified ELink at this time.

## 2024-06-12 NOTE — Progress Notes (Signed)
 RN updated pts spouse, [name], by phone.  Pt stated that [name] have been together for 26 years, he is unsure about the rest of [name]'s family.  [name]'s mother and brother are deceased.    [name] and [name] are residing at the [facility] in [place], [place]. [name] can be reached by phone at [phone] - Room 606.    [name] is unable to drive because he has a history of seizures.  [name] stated that [name] was not currently seeing a physician for her seizures.

## 2024-06-12 NOTE — Progress Notes (Addendum)
 eLink Physician-Brief Progress Note Patient Name: [name] DOB: 04-15-1956 MRN: [id number]   Date of Service  06/12/2024  HPI/Events of Note  Urinary output only 110 so far this shift Vital signs are relatively stable No evidence of kidney injury +1.6 L for the past 24 hours  eICU Interventions  For now, continue to observe, no fluids for now.  Consider initiation of tube feeds in the morning   0315 - minimal improvement in oliguria in the past 3 hours. Only 30cc. No intervention indicated  Intervention Category Intermediate Interventions: Oliguria - evaluation and management  Lior Hoen 06/12/2024, 1:22 AM

## 2024-06-12 NOTE — Progress Notes (Signed)
 Subjective: opens eyes to tactile stimulation. Follows commands  ROS: Unable to obtain due to poor mental status  Examination  Vital signs in last 24 hours: Temp:  [98.8 F (37.1 C)-100.4 F (38 C)] 99.5 F (37.5 C) (08/25 1200) Pulse Rate:  [69-109] 92 (08/25 1200) Resp:  [14-27] 16 (08/25 1200) BP: (72-146)/(54-86) 106/67 (08/25 1200) SpO2:  [95 %-100 %] 96 % (08/25 1200) Arterial Line BP: (75-184)/(46-94) 133/68 (08/25 1200) FiO2 (%):  [40 %] 40 % (08/25 0857) Weight:  [66.9 kg] 66.9 kg (08/25 0422)  General: lying in bed, intubated Neuro: opens eyes, follows commands, PERLA, anti gravity strength in all extremities  Basic Metabolic Panel: Recent Labs  Lab 06/11/24 0817 06/11/24 0912 06/11/24 0921 06/11/24 1116 06/11/24 1211 06/12/24 0421 06/12/24 0429  NA 136   < > 140 138 139 139 138  K 5.2*   < > 2.3* 3.3* 2.4* 4.3 4.4  CL 103  --  103  --  101 109  --   CO2  --   --  19*  --  24 23  --   GLUCOSE 257*  --  238*  --  125* 103*  --   BUN <3*  --  <5*  --  <5* 6*  --   CREATININE 0.70  --  0.82  --  0.58 0.81  --   CALCIUM   --   --  9.4  --  9.2 8.3*  --   MG  --   --   --   --   --  1.4*  --   PHOS  --   --   --   --   --  2.1*  --    < > = values in this interval not displayed.    CBC: Recent Labs  Lab 06/11/24 0845 06/11/24 0912 06/11/24 1116 06/11/24 1211 06/12/24 0421 06/12/24 0429  WBC 20.2*  --   --  17.6* 11.7*  --   NEUTROABS 18.6*  --   --   --   --   --   HGB 14.1 14.6 13.3 13.1 12.1 12.2  HCT 43.1 43.0 39.0 38.7 36.8 36.0  MCV 107.8*  --   --  102.7* 107.3*  --   PLT 278  --   --  266 246  --      Coagulation Studies: No results for input(s): LABPROT, INR in the last 72 hours.  Imaging personally reviewed CTH wo contrast 06/11/2024: No acute intracranial abnormality. Mild cerebral atrophy and chronic small vessel disease.  ASSESSMENT AND PLAN:67 y.o. female with breakthrough seizures presenting as status epilepticus(recurrent  seizures without return to baseline) in the setting of suspected noncompliance.    Epilepsy with status epilepticus - Likely due to medication non compliance - No further sz - Weaned off prop today - Continue LEV 1000mg  BID and LCM 50mg  BID - DC ltm tomorrow if no sz    I personally spent a total of 36 minutes in the care of the patient today including getting/reviewing separately obtained history, performing a medically appropriate exam/evaluation, counseling and educating, placing orders, referring and communicating with other health care professionals, documenting clinical information in the EHR, independently interpreting results, and coordinating care.       Arlin Krebs [facility] For questions after 5pm please refer to AMION to reach the Neurologist on call

## 2024-06-12 NOTE — Progress Notes (Signed)
 [facility] ADULT ICU REPLACEMENT PROTOCOL   The patient does apply for the [facility] Adult ICU Electrolyte Replacment Protocol based on the criteria listed below:   1.Exclusion criteria: TCTS, ECMO, Dialysis, and Myasthenia Gravis patients 2. Is GFR >/= 30 ml/min? Yes.    Patient's GFR today is >60 3. Is SCr </= 2? Yes.   Patient's SCr is 0.81 mg/dL 4. Did SCr increase >/= 0.5 in 24 hours? No. 5.Pt's weight >40kg  Yes.   6. Abnormal electrolyte(s): Phos, Mag  7. Electrolytes replaced per protocol 8.  Call MD STAT for K+ </= 2.5, Phos </= 1, or Mag </= 1 Physician:  Haze Hunter BRAVO Denim Kalmbach 06/12/2024 5:29 AM

## 2024-06-12 NOTE — Plan of Care (Signed)

## 2024-06-12 NOTE — Procedures (Signed)
 Extubation Procedure Note  Patient Details:   Name: [name] DOB: 02-May-1956 MRN: [id number]   Airway Documentation:    Vent end date: 06/12/24 Vent end time: 1125   Evaluation  O2 sats: stable throughout Complications: No apparent complications Patient did tolerate procedure well. Bilateral Breath Sounds: Clear, Diminished   Yes  Pt was extubated per MD order and placed on 3 L [place]. Cuff leak was noted prior to extubation and no stridor post. Vitals are stable at this time. RT will monitor.   Teghan Philbin 06/12/2024, 11:28 AM

## 2024-06-12 NOTE — Progress Notes (Signed)
 NAME:  [name], MRN:  [id number], DOB:  1956-06-25, LOS: 1 ADMISSION DATE:  06/11/2024, CONSULTATION DATE: 06/11/2024 REFERRING MD:  Ruthe Cornet , CHIEF COMPLAINT: Altered mental status  History of Present Illness:  68 year old female with seizure disorder, noncompliance with medications and alcohol  abuse was brought into the emergency department with altered mental status.  EMS witnessed multiple seizures, she received IM midazolam .  In the emergency department she was sedated, was intubated due to recurrent seizures, neurology was consulted, EEG is being hooked up. Of note patient had previous admission for same thing in the setting of alcohol  abuse and noncompliance with medications  Pertinent  Medical History   Past Medical History:  Diagnosis Date   Alcohol  dependence (HCC)    Anxiety    Chronic pain    Depression    Diverticulitis    Herniated cervical disc    Pancreatitis    Seizures (HCC)    alcoholic seizures     Significant Hospital Events: Including procedures, antibiotic start and stop dates in addition to other pertinent events   8/24 admit with seizure/status intubated in ED  Interim History / Subjective:  No acute events overnight EEG not showing seizures. Slow.  Remains intubated. Off and on sedation overnight.  Low grade fevers Oliguric  Objective    Blood pressure 109/76, pulse 88, temperature (!) 100.4 F (38 C), resp. rate 14, height 5' 1 (1.549 m), weight 66.9 kg, SpO2 96%.    Vent Mode: PSV;CPAP FiO2 (%):  [40 %] 40 % Set Rate:  [18 bmp] (P) 18 bmp Vt Set:  [380 mL] (P) 380 mL PEEP:  [5 cmH20] 5 cmH20 Pressure Support:  [5 cmH20] 5 cmH20 Plateau Pressure:  [10 cmH20-19 cmH20] 15 cmH20   Intake/Output Summary (Last 24 hours) at 06/12/2024 0958 Last data filed at 06/12/2024 0900 Gross per 24 hour  Intake 2331.94 ml  Output 495 ml  Net 1836.94 ml   Filed Weights   06/11/24 0800 06/12/24 0422  Weight: 72.6 kg 66.9 kg     Examination:  General: adult female of normal body habitus in NAD HEENT: [place]/AT, PERRL, no JVD Neuro: Opens eyes to voice. Follows simple commands x 4 extremities.  Chest: Clear bilateral breath sounds Heart: RRR, no MRG Abdomen: Soft, NT, ND  Labs and images reviewed  Patient Lines/Drains/Airways Status     Active Line/Drains/Airways     Name Placement date Placement time Site Days   Peripheral IV 06/11/24 20 G Anterior;Distal;Left;Upper Arm 06/11/24  0923  Arm  less than 1   NG/OG Vented/Dual Lumen 16 Fr. Oral 06/11/24  0815  Oral  less than 1   Urethral Catheter Clotilda RN Temperature probe 14 Fr. 06/11/24  0826  Temperature probe  less than 1   Airway 7.5 mm 06/11/24  0810  -- less than 1   Intraosseous Line 06/11/24 06/11/24  0800  --  less than 1        Resolved problem list    Assessment and Plan  Status epilepticus with prior history of seizure disorder, likely due to noncompliance with medications - Intubated for airway protection - EEG ongoing - so far no seizures seen - Continue keppra  BID - Resume home vimpat  - Neurology is following  Acute respiratory failure with hypoxia: in the setting of status epilepticus - Continue lung protective ventilation - SBT now. Tolerating well. Too drowsy for extubation currently.  - VAP bundle - ABG and CXR PRN - PAD with propofol  and fentanyl   for RASS 0 to -1  Alcohol  abuse disorder -thiamine  and IV fluid  Major depressive disorder - holding gabapentin , duloxetine , risperidone     Critical care time: 48 mins     Deward Eastern, AGACNP-BC [facility]  See Amion for personal pager PCCM on call pager [phone] until 7pm. Please call Elink 7p-7a. [phone]  06/12/2024 10:02 AM

## 2024-06-12 NOTE — Procedures (Signed)
 Patient Name: Yareth Kearse  MRN: [id number]  Epilepsy Attending: Arlin MALVA Krebs  Referring Physician/Provider: Michaela Aisha SQUIBB, MD  Duration: 06/11/2024 0932 to 06/12/2024 0932  Patient history: 68 year old female with seizure disorder, noncompliance with medications and alcohol  abuse was brought into the emergency department with altered mental status.  EEG to evaluate for seizure   Level of alertness: Lethargic, asleep  AEDs during EEG study: Propofol , valium , LEV  Technical aspects: This EEG study was done with scalp electrodes positioned according to the 10-20 International system of electrode placement. Electrical activity was reviewed with band pass filter of 1-70Hz , sensitivity of 7 uV/mm, display speed of 68mm/sec with a 60Hz  notched filter applied as appropriate. EEG data were recorded continuously and digitally stored.  Video monitoring was available and reviewed as appropriate.  Description: EEG initially showed posterior dominant rhythm of 8 Hz activity of moderate voltage (25-35 uV) seen predominantly in posterior head regions, symmetric and reactive to eye opening and eye closing. Sleep was characterized by vertex waves, sleep spindles (12 to 14 Hz), maximal frontocentral region. As sedation was increased, EEG showed continuous generalized 3 to 6 Hz theta-delta slowing admixed with 13-15hz  beta activity. Hyperventilation and photic stimulation were not performed.     ABNORMALITY - Continuous slow, generalized  IMPRESSION: This study was suggestive of severe diffuse encephalopathy, likely related to sedation. No seizures or epileptiform discharges were seen throughout the recording.  Caroline Longie O Tanee Henery

## 2024-06-13 ENCOUNTER — Inpatient Hospital Stay (HOSPITAL_COMMUNITY)

## 2024-06-13 ENCOUNTER — Other Ambulatory Visit (HOSPITAL_COMMUNITY): Payer: Self-pay

## 2024-06-13 ENCOUNTER — Encounter (HOSPITAL_COMMUNITY)

## 2024-06-13 DIAGNOSIS — G40909 Epilepsy, unspecified, not intractable, without status epilepticus: Secondary | ICD-10-CM | POA: Diagnosis not present

## 2024-06-13 DIAGNOSIS — Z91148 Patient's other noncompliance with medication regimen for other reason: Secondary | ICD-10-CM | POA: Diagnosis not present

## 2024-06-13 DIAGNOSIS — F32A Depression, unspecified: Secondary | ICD-10-CM | POA: Diagnosis not present

## 2024-06-13 DIAGNOSIS — J9601 Acute respiratory failure with hypoxia: Secondary | ICD-10-CM | POA: Diagnosis not present

## 2024-06-13 DIAGNOSIS — F1090 Alcohol use, unspecified, uncomplicated: Secondary | ICD-10-CM | POA: Diagnosis not present

## 2024-06-13 DIAGNOSIS — G40901 Epilepsy, unspecified, not intractable, with status epilepticus: Secondary | ICD-10-CM | POA: Diagnosis not present

## 2024-06-13 LAB — COMPREHENSIVE METABOLIC PANEL WITH GFR
ALT: 45 U/L — ABNORMAL HIGH (ref 0–44)
AST: 176 U/L — ABNORMAL HIGH (ref 15–41)
Albumin: 2.4 g/dL — ABNORMAL LOW (ref 3.5–5.0)
Alkaline Phosphatase: 65 U/L (ref 38–126)
Anion gap: 9 (ref 5–15)
BUN: 7 mg/dL — ABNORMAL LOW (ref 8–23)
CO2: 23 mmol/L (ref 22–32)
Calcium: 7.9 mg/dL — ABNORMAL LOW (ref 8.9–10.3)
Chloride: 108 mmol/L (ref 98–111)
Creatinine, Ser: 0.64 mg/dL (ref 0.44–1.00)
GFR, Estimated: >60 mL/min (ref >60)
Glucose, Bld: 91 mg/dL (ref 70–99)
Potassium: 3.5 mmol/L (ref 3.5–5.1)
Sodium: 140 mmol/L (ref 135–145)
Total Bilirubin: 1.2 mg/dL (ref 0.0–1.2)
Total Protein: 5.3 g/dL — ABNORMAL LOW (ref 6.5–8.1)

## 2024-06-13 LAB — GLUCOSE, CAPILLARY
Glucose-Capillary: 81 mg/dL (ref 70–99)
Glucose-Capillary: 83 mg/dL (ref 70–99)
Glucose-Capillary: 84 mg/dL (ref 70–99)
Glucose-Capillary: 86 mg/dL (ref 70–99)
Glucose-Capillary: 89 mg/dL (ref 70–99)
Glucose-Capillary: 92 mg/dL (ref 70–99)

## 2024-06-13 LAB — CBC
HCT: 35.1 % — ABNORMAL LOW (ref 36.0–46.0)
Hemoglobin: 11.5 g/dL — ABNORMAL LOW (ref 12.0–15.0)
MCH: 34.8 pg — ABNORMAL HIGH (ref 26.0–34.0)
MCHC: 32.8 g/dL (ref 30.0–36.0)
MCV: 106.4 fL — ABNORMAL HIGH (ref 80.0–100.0)
Platelets: 239 K/uL (ref 150–400)
RBC: 3.3 MIL/uL — ABNORMAL LOW (ref 3.87–5.11)
RDW: 13.7 % (ref 11.5–15.5)
WBC: 7.7 K/uL (ref 4.0–10.5)
nRBC: 0 % (ref 0.0–0.2)

## 2024-06-13 LAB — MAGNESIUM: Magnesium: 1.9 mg/dL (ref 1.7–2.4)

## 2024-06-13 LAB — PHOSPHORUS: Phosphorus: 2.8 mg/dL (ref 2.5–4.6)

## 2024-06-13 MED ORDER — ACETAMINOPHEN 325 MG PO TABS: 650.0000 mg | ORAL_TABLET | Freq: Four times a day (QID) | ORAL | Status: DC | PRN

## 2024-06-13 MED ORDER — FAMOTIDINE 20 MG PO TABS
20.0000 mg | ORAL_TABLET | Freq: Two times a day (BID) | ORAL | Status: DC
  Administered 2024-06-13 – 2024-06-15 (×4): 20 mg via ORAL
  Filled 2024-06-13 (×4): qty 1

## 2024-06-13 MED ORDER — LEVETIRACETAM ER 500 MG PO TB24
1000.0000 mg | ORAL_TABLET | Freq: Every day | ORAL | Status: DC
  Administered 2024-06-13 – 2024-06-14 (×2): 1000 mg via ORAL
  Filled 2024-06-13 (×4): qty 2

## 2024-06-13 MED ORDER — DOCUSATE SODIUM 100 MG PO CAPS
100.0000 mg | ORAL_CAPSULE | Freq: Two times a day (BID) | ORAL | Status: DC
  Administered 2024-06-13 – 2024-06-15 (×4): 100 mg via ORAL
  Filled 2024-06-13 (×4): qty 1

## 2024-06-13 MED ORDER — CLOBAZAM 5 MG PO HALF TABLET
5.0000 mg | ORAL_TABLET | Freq: Every day | ORAL | Status: DC
  Administered 2024-06-13 – 2024-06-14 (×2): 5 mg via ORAL
  Filled 2024-06-13 (×2): qty 1

## 2024-06-13 MED ORDER — FOLIC ACID 5 MG/ML IJ SOLN
1.0000 mg | Freq: Every day | INTRAMUSCULAR | Status: DC
  Administered 2024-06-13 – 2024-06-14 (×2): 1 mg via INTRAVENOUS
  Filled 2024-06-13 (×2): qty 0.2

## 2024-06-13 MED ORDER — POLYETHYLENE GLYCOL 3350 17 G PO PACK: 17.0000 g | PACK | Freq: Every day | ORAL | Status: DC | PRN

## 2024-06-13 MED ORDER — GABAPENTIN 300 MG PO CAPS
300.0000 mg | ORAL_CAPSULE | Freq: Three times a day (TID) | ORAL | Status: DC
  Administered 2024-06-13 – 2024-06-15 (×8): 300 mg via ORAL
  Filled 2024-06-13 (×8): qty 1

## 2024-06-13 MED ORDER — OXYCODONE HCL 5 MG PO TABS
5.0000 mg | ORAL_TABLET | ORAL | Status: DC | PRN
  Administered 2024-06-13 – 2024-06-15 (×8): 5 mg via ORAL
  Filled 2024-06-13 (×8): qty 1

## 2024-06-13 MED ORDER — POLYETHYLENE GLYCOL 3350 17 G PO PACK
17.0000 g | PACK | Freq: Every day | ORAL | Status: DC
  Filled 2024-06-13 (×2): qty 1

## 2024-06-13 NOTE — Progress Notes (Signed)
 vLTM maintenance  Restarted eeg equipment  No skin breakdown noted at FP1 FP2 A1 A2  All impedances below 10kohms

## 2024-06-13 NOTE — Plan of Care (Signed)
  Problem: Coping: Goal: Ability to adjust to condition or change in health will improve Outcome: Progressing   Problem: Health Behavior/Discharge Planning: Goal: Ability to identify and utilize available resources and services will improve Outcome: Progressing Goal: Ability to manage health-related needs will improve Outcome: Progressing   Problem: Fluid Volume: Goal: Ability to maintain a balanced intake and output will improve Outcome: Not Progressing   Problem: Nutritional: Goal: Maintenance of adequate nutrition will improve Outcome: Not Progressing

## 2024-06-13 NOTE — Procedures (Addendum)
 Patient Name: Timaya Bojarski  MRN: [id number]  Epilepsy Attending: Arlin MALVA Krebs  Referring Physician/Provider: Michaela Aisha SQUIBB, MD  Duration: 06/12/2024 0932 to 06/13/2024 1236   Patient history: 68 year old female with seizure disorder, noncompliance with medications and alcohol  abuse was brought into the emergency department with altered mental status.  EEG to evaluate for seizure    Level of alertness: Lethargic, asleep   AEDs during EEG study: LEV, LCM   Technical aspects: This EEG study was done with scalp electrodes positioned according to the 10-20 International system of electrode placement. Electrical activity was reviewed with band pass filter of 1-70Hz , sensitivity of 7 uV/mm, display speed of 29mm/sec with a 60Hz  notched filter applied as appropriate. EEG data were recorded continuously and digitally stored.  Video monitoring was available and reviewed as appropriate.   Description: The posterior dominant rhythm consists of 8 Hz activity of moderate voltage (25-35 uV) seen predominantly in posterior head regions, symmetric and reactive to eye opening and eye closing. Sleep was characterized by vertex waves, sleep spindles (12 to 14 Hz), maximal frontocentral region. EEG also showed continuous generalized 3 to 6 Hz theta-delta slowing admixed with 13-15hz  beta activity. Hyperventilation and photic stimulation were not performed.     EEG was not recorded between 06/13/2024 0730 to 0837 due to technically difficulty   ABNORMALITY - Continuous slow, generalized   IMPRESSION: This study was suggestive of moderate diffuse encephalopathy. No seizures or epileptiform discharges were seen throughout the recording.   Tyah Acord O Kristian Mogg

## 2024-06-13 NOTE — Evaluation (Signed)
 Speech Language Pathology Evaluation Patient Details Name: [name] MRN: [id number] DOB: [birth year] Today's Date: 06/13/2024 Time: 9063-9046 SLP Time Calculation (min) (ACUTE ONLY): 17 min  Problem List:  Patient Active Problem List   Diagnosis Date Noted   Convulsions, status epilepticus (HCC) 06/11/2024   Recurrent seizures (HCC) 08/18/2023   History of seizure 05/05/2023   GAD (generalized anxiety disorder) 05/05/2023   Seizure-like activity (HCC) 12/18/2022   Seizure (HCC) 12/18/2022   Hyperkalemia 10/19/2022   Acute metabolic encephalopathy 10/18/2022   Seizures (HCC) 10/17/2022   Chronic pain    Hypoalbuminemia    Hyperglycemia    Nonadherence to medication    Seizure (HCC) 05/17/2022   Status epilepticus (HCC)    Hypokalemia    Alcohol -induced mood disorder (HCC)    Closed displaced oblique fracture of shaft of left humerus 08/01/2020   GERD (gastroesophageal reflux disease) 02/28/2020   Alcohol  withdrawal related seizure (HCC) 12/13/2018   Thrombocytopenia (HCC) 12/13/2018   Major depressive disorder 12/13/2018   Neuropathy 12/13/2018   Subarachnoid hemorrhage (HCC) 12/11/2018   Seizure due to alcohol  withdrawal (HCC) 12/11/2018   Alcohol  dependence with alcohol -induced mood disorder (HCC)    Major depressive disorder, 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   Alcohol  use disorder, severe, dependence (HCC) 05/16/2018   Past Medical History:  Past Medical History:  Diagnosis Date   Alcohol  dependence (HCC)    Anxiety    Chronic pain    Depression    Diverticulitis    Herniated cervical disc    Pancreatitis    Seizures (HCC)    alcoholic seizures   Past Surgical History:  Past Surgical History:  Procedure Laterality Date   ABDOMINAL HYSTERECTOMY     ANKLE SURGERY Right    CARPAL TUNNEL RELEASE Bilateral    CERVICAL FUSION     CHOLECYSTECTOMY     KNEE SURGERY     HPI:  68 yo F presenting 06/11/24 with AMS after  multiple witnessed seizures.  ETT 8/24-8/25. Continuous EEG placed. PMH: Seizure disorder, ETOH withdrawal, pancreatitis, anxiety, depression and SAH. Previous swallow evals in the setting of breakthrough seizures in June 2023 and August 2023 both recommended Dys 2/thin liquids due to AMS and dental problems.   Assessment / Plan / Recommendation Clinical Impression  Pt  is most troubled by reported changes to her speech. Her voice is dysphonic and her articulation is imprecise, which does impact intelligibility at the sentence level. SLP administered portions of the Cognistat with severe impairments in memory and mild impairments in sustained attention observed. She is also disoriented to time, needing frequent repetition for brief periods of delayed recall but improving the most when given visual aid for reminder. Expressive language is a relative strength for her. During conversation with MD regarding medications, pt exhibited reduced reasoning and understanding. 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 However, no family is available to confirm PLOF and pt is definitely concerned about what she reports to be an acute change in her speech. SLP will f/u and try to get additional confirmation from family.      SLP Assessment  SLP Recommendation/Assessment: Patient needs continued Speech Language Pathology Services SLP Visit Diagnosis: Dysarthria and anarthria (R47.1);Cognitive communication deficit (R41.841)     Assistance Recommended at Discharge  Frequent or constant  Supervision/Assistance  Functional Status Assessment Patient has had a recent decline in their functional status and demonstrates the ability to make significant improvements in function in a reasonable and predictable amount of time.  Frequency  and Duration min 2x/week  2 weeks      SLP Evaluation Cognition  Overall Cognitive Status: No family/caregiver present to determine baseline cognitive functioning Arousal/Alertness: Awake/alert Orientation Level: Oriented to person;Oriented to place;Oriented to situation;Disoriented to time Attention: Sustained Sustained Attention: Impaired Sustained Attention Impairment: Verbal basic Memory: Impaired Memory Impairment: Storage deficit;Retrieval deficit Awareness: Impaired Awareness Impairment: Other (comment) (online awareness) Executive Function: Reasoning Reasoning: Impaired Reasoning Impairment: Verbal complex Safety/Judgment: Impaired       Comprehension  Auditory Comprehension Overall Auditory Comprehension: Impaired Commands: Impaired One Step Basic Commands: 75-100% accurate Conversation: Simple    Expression Expression Primary Mode of Expression: Verbal Verbal Expression Overall Verbal Expression: Appears within functional limits for tasks assessed   Oral / Motor  Oral Motor/Sensory Function Overall Oral Motor/Sensory Function: Generalized oral weakness Motor Speech Overall Motor Speech: Impaired Respiration: Within functional limits Phonation: Hoarse Resonance: Within functional limits Articulation: Impaired Level of Impairment: Sentence Intelligibility: Intelligibility reduced Sentence: 75-100% accurate            Leita SAILOR., M.A. CCC-SLP Acute Rehabilitation Services Office: [phone]  Secure chat preferred  06/13/2024, 10:18 AM

## 2024-06-13 NOTE — TOC Initial Note (Signed)
 Transition of Care [facility]) - Initial/Assessment Note    Patient Details  Name: [name] MRN: [id number] Date of Birth: July 05, 1956  Transition [facility]) CM/SW Contact:    Inocente GORMAN Kindle, LCSW Phone Number: 06/13/2024, 3:44 PM  Clinical Narrative:                 Patient admitted from home with seizure workup. Patient extubated. ICM continuing to follow for potential therapy needs.     Barriers to Discharge: Continued Medical Work up   Patient Goals and CMS Choice            Expected Discharge Plan and Services                                              Prior Living Arrangements/Services   Lives with:: Self Patient language and need for interpreter reviewed:: Yes Do you feel safe going back to the place where you live?: Yes            Criminal Activity/Legal Involvement Pertinent to Current Situation/Hospitalization: No - Comment as needed  Activities of Daily Living      Permission Sought/Granted                  Emotional Assessment Appearance:: Appears stated age     Orientation: : Oriented to Self, Oriented to Place, Oriented to  Time, Oriented to Situation Alcohol  / Substance Use: Not Applicable Psych Involvement: No (comment)  Admission diagnosis:  Status epilepticus (HCC) [G40.901] Acute respiratory failure with hypoxia (HCC) [J96.01] Convulsions, status epilepticus (HCC) [G40.901] Patient Active Problem List   Diagnosis Date Noted   Convulsions, status epilepticus (HCC) 06/11/2024   Recurrent seizures (HCC) 08/18/2023   History of seizure 05/05/2023   GAD (generalized anxiety disorder) 05/05/2023   Seizure-like activity (HCC) 12/18/2022   Seizure (HCC) 12/18/2022   Hyperkalemia 10/19/2022   Acute metabolic encephalopathy 10/18/2022   Seizures (HCC) 10/17/2022   Chronic pain    Hypoalbuminemia    Hyperglycemia    Nonadherence to medication    Seizure (HCC) 05/17/2022   Status epilepticus (HCC)     Hypokalemia    Alcohol -induced mood disorder (HCC)    Closed displaced oblique fracture of shaft of left humerus 08/01/2020   GERD (gastroesophageal reflux disease) 02/28/2020   Alcohol  withdrawal related seizure (HCC) 12/13/2018   Thrombocytopenia (HCC) 12/13/2018   Major depressive disorder 12/13/2018   Neuropathy 12/13/2018   Subarachnoid hemorrhage (HCC) 12/11/2018   Seizure due to alcohol  withdrawal (HCC) 12/11/2018   Alcohol  dependence with alcohol -induced mood disorder (HCC)    Major depressive disorder, 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   Alcohol  use disorder, severe, dependence (HCC) 05/16/2018   PCP:  Lenon Nell SAILOR, FNP Pharmacy:   [facility] [address] [place] [address] Phone: [phone] Fax: [phone]  [facility] [address] [place] - [address] [place] [address] Phone: [phone] Fax: [phone]  [facility] [address] ([address] [place] - [address] [place] ([place]) [place] [address] Phone: [phone] Fax: [phone]     Social Drivers of Health (SDOH) Social History: SDOH Screenings   Food Insecurity: Patient Unable To Answer (06/12/2024)  Housing: Patient Unable To Answer (01/12/2024)  Transportation Needs: Patient Unable To Answer (01/12/2024)  Utilities: Patient  Unable To Answer (01/12/2024)  Alcohol  Screen: Medium Risk (12/18/2018)  Depression (PHQ2-9): Medium Risk (05/10/2021)  Financial Resource Strain: Medium Risk (09/27/2018)  Physical Activity: Unknown (09/27/2018)  Social Connections: Patient Unable To Answer (01/12/2024)  Stress: No Stress Concern Present (09/27/2018)  Tobacco Use: High Risk (01/22/2024)   SDOH Interventions:     Readmission Risk Interventions     No data to display

## 2024-06-13 NOTE — Progress Notes (Signed)
 NAME:  [name], MRN:  [id number], DOB:  11/10/1955, LOS: 2 ADMISSION DATE:  06/11/2024, CONSULTATION DATE: 06/11/2024 REFERRING MD:  Ruthe Cornet , CHIEF COMPLAINT: Altered mental status  History of Present Illness:  68 year old female with seizure disorder, noncompliance with medications and alcohol  abuse was brought into the emergency department with altered mental status.  EMS witnessed multiple seizures, she received IM midazolam .  In the emergency department she was sedated, was intubated due to recurrent seizures, neurology was consulted, EEG is being hooked up. Of note patient had previous admission for same thing in the setting of alcohol  abuse and noncompliance with medications  Pertinent  Medical History   Past Medical History:  Diagnosis Date   Alcohol  dependence (HCC)    Anxiety    Chronic pain    Depression    Diverticulitis    Herniated cervical disc    Pancreatitis    Seizures (HCC)    alcoholic seizures     Significant Hospital Events: Including procedures, antibiotic start and stop dates in addition to other pertinent events   8/24 admit with seizure/status intubated in ED  Interim History / Subjective:  No acute events overnight Remains on EEG  Objective    Blood pressure 138/77, pulse 74, temperature 98.4 F (36.9 C), resp. rate 12, height 5' 1 (1.549 m), weight 66.9 kg, SpO2 99%.    FiO2 (%):  [21 %] 21 %   Intake/Output Summary (Last 24 hours) at 06/13/2024 0926 Last data filed at 06/13/2024 0800 Gross per 24 hour  Intake 861.11 ml  Output 1065 ml  Net -203.89 ml   Filed Weights   06/11/24 0800 06/12/24 0422  Weight: 72.6 kg 66.9 kg    Examination:  General: Adult female in NAD HEENT: [place]/AT, PERRL, EEG leads, no JVD Neuro: Alert, oriented x 3, non-focal. A little sluggish with responses.  Chest: Clear bilateral breath sounds Heart: RRR, no MRG Abdomen: Soft, NT, ND  AM labs pending  Patient Lines/Drains/Airways Status      Active Line/Drains/Airways     Name Placement date Placement time Site Days   Peripheral IV 06/11/24 20 G Anterior;Distal;Left;Upper Arm 06/11/24  0923  Arm  less than 1   NG/OG Vented/Dual Lumen 16 Fr. Oral 06/11/24  0815  Oral  less than 1   Urethral Catheter Shannon RN Temperature probe 14 Fr. 06/11/24  0826  Temperature probe  less than 1   Airway 7.5 mm 06/11/24  0810  -- less than 1   Intraosseous Line 06/11/24 06/11/24  0800  --  less than 1        Resolved problem list    Assessment and Plan  Status epilepticus with prior history of seizure disorder, likely due to noncompliance with medications - EEG ongoing - so far no seizures seen - Keppra , Vimpat . Appreciate Dr. Shelton - Likely DC EEG today  Acute respiratory failure with hypoxia: in the setting of status epilepticus Concern for CAP - Extubated yesterday and tolerating well.  - Wean O2 to off - Tracheal aspirate pending - Doxy x 7 days  Alcohol  abuse disorder -Thiamine , folate -Monitor for signs of withdrawal.   Major depressive disorder - holding gabapentin , duloxetine , risperidone   Right upper arm pain: ? Seizure related trauma. Some bruising on exam. ? Cuff related.  - Xray   IV team consult for PIV placement and IO removal. Remove art line Remove foley   Critical care time:      Deward Eastern, AGACNP-BC [facility]  See Amion for personal pager PCCM on call pager [phone] until 7pm. Please call Elink 7p-7a. [phone]  06/13/2024 9:26 AM

## 2024-06-13 NOTE — Progress Notes (Signed)
 Bed CPT held due to contin. EEG. RN aware.

## 2024-06-13 NOTE — Progress Notes (Addendum)
 Subjective: No acute events overnight.  Patient states she takes gabapentin  but does not take Keppra  and Vimpat  at home because it makes her feel weird.  States she has not talked to any doctor about this.  ROS: negative except above Examination  Vital signs in last 24 hours: Temp:  [98.2 F (36.8 C)-100 F (37.8 C)] 98.4 F (36.9 C) (08/26 0815) Pulse Rate:  [64-99] 74 (08/26 0815) Resp:  [10-20] 12 (08/26 0815) BP: (100-145)/(59-85) 138/77 (08/26 0800) SpO2:  [93 %-100 %] 99 % (08/26 0815) Arterial Line BP: (90-173)/(61-90) 147/78 (08/26 0815) FiO2 (%):  [21 %] 21 % (08/25 1957)  General: lying in bed, NAD Neuro: MS: Alert, oriented, follows commands CN: pupils equal and reactive,  EOMI, face symmetric, tongue midline, normal sensation over face, Motor: 5/5 strength in all 4 extremities Coordination: normal Gait: not tested  Basic Metabolic Panel: Recent Labs  Lab 06/11/24 0817 06/11/24 0912 06/11/24 0921 06/11/24 1116 06/11/24 1211 06/12/24 0421 06/12/24 0429  NA 136   < > 140 138 139 139 138  K 5.2*   < > 2.3* 3.3* 2.4* 4.3 4.4  CL 103  --  103  --  101 109  --   CO2  --   --  19*  --  24 23  --   GLUCOSE 257*  --  238*  --  125* 103*  --   BUN <3*  --  <5*  --  <5* 6*  --   CREATININE 0.70  --  0.82  --  0.58 0.81  --   CALCIUM   --   --  9.4  --  9.2 8.3*  --   MG  --   --   --   --   --  1.4*  --   PHOS  --   --   --   --   --  2.1*  --    < > = values in this interval not displayed.    CBC: Recent Labs  Lab 06/11/24 0845 06/11/24 0912 06/11/24 1116 06/11/24 1211 06/12/24 0421 06/12/24 0429  WBC 20.2*  --   --  17.6* 11.7*  --   NEUTROABS 18.6*  --   --   --   --   --   HGB 14.1 14.6 13.3 13.1 12.1 12.2  HCT 43.1 43.0 39.0 38.7 36.8 36.0  MCV 107.8*  --   --  102.7* 107.3*  --   PLT 278  --   --  266 246  --      Coagulation Studies: No results for input(s): LABPROT, INR in the last 72 hours.  Imaging No new brain imaging  overnight    ASSESSMENT AND PLAN:67 y.o. female with breakthrough seizures presenting as status epilepticus(recurrent seizures without return to baseline) in the setting of suspected noncompliance.     Epilepsy with status epilepticus (resolved ) - Likely due to medication non compliance and alcohol  use -No further seizures, DC LTM EEG - Patient states she does not like taking her medications because they make her feel weird.  Does report taking gabapentin . -Resume home dose of gabapentin  300 mg 3 times daily -Discussed with patient that gabapentin  is an antiseizure medication but unfortunately she needs to be on another antiseizure medication because of breakthrough seizures. -I discussed about switching her to once daily medications to see if that helps her with compliance and minimize side effects.  Patient agrees with this -Therefore we will switch to Keppra  extended  release 1000 mg nightly.  Her co-pay for this is $0 -Will also stop Vimpat  and switch to Onfi  5 mg nightly -Will hold off on prescribing rescue medication due to history of alcohol  use and medication noncompliance for now - I again discussed seizure precautions including no driving -Counseled patient regarding alcohol  cessation -Discussed seizure provoking factors including lack of sleep, medication noncompliance, alcohol  use, sleep deprivation -Also discussed concern for status epilepticus, permanent brain injury, SUDEP -Recommend follow-up with neurology.  Order placed  Seizure precautions: Per   [place]V statutes, patients with seizures are not allowed to drive until they have been seizure-free for six months and cleared by a physician    Use caution when using heavy equipment or power tools. Avoid working on ladders or at heights. Take showers instead of baths. Ensure the water temperature is not too high on the home water heater. Do not go swimming alone. Do not lock yourself in a room alone (i.e. bathroom).  When caring for infants or small children, sit down when holding, feeding, or changing them to minimize risk of injury to the child in the event you have a seizure. Maintain good sleep hygiene. Avoid alcohol .    If patient has another seizure, call 911 and bring them back to the ED if: A.  The seizure lasts longer than 5 minutes.      B.  The patient doesn't wake shortly after the seizure or has new problems such as difficulty seeing, speaking or moving following the seizure C.  The patient was injured during the seizure D.  The patient has a temperature over 102 F (39C) E.  The patient vomited during the seizure and now is having trouble breathing    During the Seizure   - First, ensure adequate ventilation and place patients on the floor on their left side  Loosen clothing around the neck and ensure the airway is patent. If the patient is clenching the teeth, do not force the mouth open with any object as this can cause severe damage - Remove all items from the surrounding that can be hazardous. The patient may be oblivious to what's happening and may not even know what he or she is doing. If the patient is confused and wandering, either gently guide him/her away and block access to outside areas - Reassure the individual and be comforting - Call 911. In most cases, the seizure ends before EMS arrives. However, there are cases when seizures may last over 3 to 5 minutes. Or the individual may have developed breathing difficulties or severe injuries. If a pregnant patient or a person with diabetes develops a seizure, it is prudent to call an ambulance.    After the Seizure (Postictal Stage)   After a seizure, most patients experience confusion, fatigue, muscle pain and/or a headache. Thus, one should permit the individual to sleep. For the next few days, reassurance is essential. Being calm and helping reorient the person is also of importance.   Most seizures are painless and end  spontaneously. Seizures are not harmful to others but can lead to complications such as stress on the lungs, brain and the heart. Individuals with prior lung problems may develop labored breathing and respiratory distress.       I personally spent a total of 36 minutes in the care of the patient today including getting/reviewing separately obtained history, performing a medically appropriate exam/evaluation, counseling and educating, placing orders, referring and communicating with other health care professionals, documenting clinical information  in the EHR, independently interpreting results, and coordinating care.        [facility] For questions after 5pm please refer to AMION to reach the Neurologist on call

## 2024-06-13 NOTE — Evaluation (Signed)
 Clinical/Bedside Swallow Evaluation Patient Details  Name: [name] MRN: [id number] Date of Birth: 11-01-1955  Today's Date: 06/13/2024 Time: SLP Start Time (ACUTE ONLY): 0920 SLP Stop Time (ACUTE ONLY): 0936 SLP Time Calculation (min) (ACUTE ONLY): 16 min  Past Medical History:  Past Medical History:  Diagnosis Date   Alcohol  dependence (HCC)    Anxiety    Chronic pain    Depression    Diverticulitis    Herniated cervical disc    Pancreatitis    Seizures (HCC)    alcoholic seizures   Past Surgical History:  Past Surgical History:  Procedure Laterality Date   ABDOMINAL HYSTERECTOMY     ANKLE SURGERY Right    CARPAL TUNNEL RELEASE Bilateral    CERVICAL FUSION     CHOLECYSTECTOMY     KNEE SURGERY     HPI:  68 yo F presenting 06/11/24 with AMS after multiple witnessed seizures.  ETT 8/24-8/25. Continuous EEG placed. PMH: Seizure disorder, ETOH withdrawal, pancreatitis, anxiety, depression and SAH. Previous swallow evals in the setting of breakthrough seizures in June 2023 and August 2023 both recommended Dys 2/thin liquids due to AMS and dental problems.    Assessment / Plan / Recommendation  Clinical Impression  Pt has generalized weakness especially noted in her tongue, which pt reports to be very sore. SLP noted very small amounts of blood bilaterally during oral care - question if she bit her tongue during her seizures. She also has c/o a sore throat and there is some dysphonia present, likely from recent intubation. Pt has subtle signs of a post-extubation dysphagia that also include throat clearing and a little coughing with thin liquids. Pt also reports odynophagia with thin liquids more than other consistencies. There are no overt s/s of aspiration with purees and ice chips. Hopeful that pt will progress to PO diet quickly but at this time she is self-limiting due to pain. Recommend starting with small amounts of puree from the floor stock, which can also be used  to give crushed medications. Would also offer ice chips after oral care. SLP will f/u for readiness to advance diet as pain subsides and with increased time post-extubation.   SLP Visit Diagnosis: Dysphagia, unspecified (R13.10)    Aspiration Risk       Diet Recommendation NPO except meds;Ice chips PRN after oral care (bites of puree from floor stock)    Medication Administration: Crushed with puree    Other  Recommendations Oral Care Recommendations: Oral care QID     Assistance Recommended at Discharge    Functional Status Assessment Patient has had a recent decline in their functional status and demonstrates the ability to make significant improvements in function in a reasonable and predictable amount of time.  Frequency and Duration min 2x/week  2 weeks       Prognosis Prognosis for improved oropharyngeal function: Good Barriers to Reach Goals: Cognitive deficits      Swallow Study   General HPI: 68 yo F presenting 06/11/24 with AMS after multiple witnessed seizures.  ETT 8/24-8/25. Continuous EEG placed. PMH: Seizure disorder, ETOH withdrawal, pancreatitis, anxiety, depression and SAH. Previous swallow evals in the setting of breakthrough seizures in June 2023 and August 2023 both recommended Dys 2/thin liquids due to AMS and dental problems. Type of Study: Bedside Swallow Evaluation Previous Swallow Assessment: see HPI Diet Prior to this Study: NPO Temperature Spikes Noted: Yes (100.4) Respiratory Status: Nasal cannula History of Recent Intubation: Yes Total duration of intubation (days): 2 days  Date extubated: 06/12/24 Behavior/Cognition: Alert;Cooperative;Pleasant mood;Requires cueing Oral Cavity Assessment: Dry;Other (comment) (trace amount of blood bilaterally during oral care) Oral Care Completed by SLP: Yes (limited/gentle oral care provided due to bleeding) Oral Cavity - Dentition: Poor condition;Missing dentition Vision: Functional for self-feeding Self-Feeding  Abilities: Needs assist Patient Positioning: Upright in bed Baseline Vocal Quality: Hoarse Volitional Cough: Weak Volitional Swallow: Able to elicit    Oral/Motor/Sensory Function Overall Oral Motor/Sensory Function: Generalized oral weakness   Ice Chips Ice chips: Within functional limits Presentation: Spoon   Thin Liquid Thin Liquid: Impaired Presentation: Cup;Spoon;Straw Pharyngeal  Phase Impairments: Throat Clearing - Immediate;Cough - Immediate    Nectar Thick Nectar Thick Liquid: Not tested   Honey Thick Honey Thick Liquid: Not tested   Puree Puree: Within functional limits Presentation: Spoon   Solid     Solid: Not tested      Leita SAILOR., M.A. CCC-SLP Acute Rehabilitation Services Office: [phone]  Secure chat preferred  06/13/2024,10:04 AM

## 2024-06-13 NOTE — Progress Notes (Signed)
 LTM EEG D/C'd. No noted skin break down. [facility] notified.

## 2024-06-13 NOTE — TOC Benefit Eligibility Note (Signed)
 Pharmacy Patient Advocate Encounter  Insurance verification completed.    The patient is insured through [place] Part D. Patient has Medicare and is not eligible for a copay card, but may be able to apply for patient assistance or Medicare RX Payment Plan (Patient Must reach out to their plan, if eligible for payment plan), if available.    Ran test claim for Levetiracetam  500mg  XR and the current 30 day co-pay is $0.   This test claim was processed through [facility]- copay amounts may vary at other pharmacies due to [employer], or as the patient moves through the different stages of their insurance plan.

## 2024-06-13 NOTE — Procedures (Signed)
   AUDIOLOGICAL Consultation and Evaluation   NAME: [name]     DOB:   07-31-1956      MRN: [id number]                                                                                     DATE: 06/13/2024     REFERENT: Lenon Nell SAILOR, FNP STATUS: Outpatient DIAGNOSIS: Presbycusis    History: [name] was seen for an audiological evaluation at bedside due to reports of difficulty hearing. [name] says she is aware she has hearing loss and does not use hearing aids.  [name] denies pain, pressure, or tinnitus today.  [name] has history of hazardous noise exposure.  Medical history shows  risk for hearing loss.    Evaluation:  Otoscopy showed a clear view of the tympanic membranes, bilaterally Tympanometry results were consistent with normal middle ear compliance bilaterally   Audiometric screening of pure tones was completed using Conventional Audiometry techniques with insert earphones at bedside. 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   Results:  The test results were reviewed with [name]. She has sloping hearing loss and will need amplification. [name] will not be able to hear speech clearly unless she can see the providers face. She is lip reading high pitch consonants. She is aware she needs hearing aids.  Audiogram below.     Recommendations: While admitted [place] would benefit from use of PSAP amplifier [name]. The dial on the side controls volume. It will amplify providers voices into audible range for [name] during admission.  This can be requested through Interpreting. Audiologist will place request.  Recommend referral for complete audiologic evaluation at discharge. Refer to Lauraine Luria at Outpatient [facility] for [facility].    If you have any questions please feel free to contact audiology at outpatient office [phone] or secure chat  Lauraine Ka Stalnaker  Au.D.  Audiologist   06/13/2024  9:52 AM  Cc: Lenon Nell SAILOR, FNP

## 2024-06-14 ENCOUNTER — Other Ambulatory Visit (HOSPITAL_COMMUNITY): Payer: Self-pay

## 2024-06-14 ENCOUNTER — Encounter (HOSPITAL_COMMUNITY): Payer: Self-pay | Admitting: Internal Medicine

## 2024-06-14 ENCOUNTER — Telehealth: Payer: Self-pay

## 2024-06-14 ENCOUNTER — Other Ambulatory Visit: Payer: Self-pay

## 2024-06-14 DIAGNOSIS — Z91148 Patient's other noncompliance with medication regimen for other reason: Secondary | ICD-10-CM | POA: Diagnosis not present

## 2024-06-14 DIAGNOSIS — F1090 Alcohol use, unspecified, uncomplicated: Secondary | ICD-10-CM | POA: Diagnosis not present

## 2024-06-14 DIAGNOSIS — G40909 Epilepsy, unspecified, not intractable, without status epilepticus: Secondary | ICD-10-CM | POA: Diagnosis not present

## 2024-06-14 DIAGNOSIS — G40901 Epilepsy, unspecified, not intractable, with status epilepticus: Secondary | ICD-10-CM | POA: Diagnosis not present

## 2024-06-14 LAB — GLUCOSE, CAPILLARY
Glucose-Capillary: 76 mg/dL (ref 70–99)
Glucose-Capillary: 71 mg/dL (ref 70–99)
Glucose-Capillary: 127 mg/dL — ABNORMAL HIGH (ref 70–99)
Glucose-Capillary: 125 mg/dL — ABNORMAL HIGH (ref 70–99)

## 2024-06-14 LAB — COMPREHENSIVE METABOLIC PANEL WITH GFR
ALT: 52 U/L — ABNORMAL HIGH (ref 0–44)
AST: 166 U/L — ABNORMAL HIGH (ref 15–41)
Albumin: 2.8 g/dL — ABNORMAL LOW (ref 3.5–5.0)
Alkaline Phosphatase: 67 U/L (ref 38–126)
Anion gap: 7 (ref 5–15)
BUN: <5 mg/dL — ABNORMAL LOW (ref 8–23)
CO2: 25 mmol/L (ref 22–32)
Calcium: 8 mg/dL — ABNORMAL LOW (ref 8.9–10.3)
Chloride: 107 mmol/L (ref 98–111)
Creatinine, Ser: 0.61 mg/dL (ref 0.44–1.00)
GFR, Estimated: >60 mL/min (ref >60)
Glucose, Bld: 77 mg/dL (ref 70–99)
Potassium: 3.3 mmol/L — ABNORMAL LOW (ref 3.5–5.1)
Sodium: 139 mmol/L (ref 135–145)
Total Bilirubin: 1.2 mg/dL (ref 0.0–1.2)
Total Protein: 6.1 g/dL — ABNORMAL LOW (ref 6.5–8.1)

## 2024-06-14 LAB — CBC
HCT: 40.2 % (ref 36.0–46.0)
Hemoglobin: 13.1 g/dL (ref 12.0–15.0)
MCH: 34.7 pg — ABNORMAL HIGH (ref 26.0–34.0)
MCHC: 32.6 g/dL (ref 30.0–36.0)
MCV: 106.3 fL — ABNORMAL HIGH (ref 80.0–100.0)
Platelets: 318 K/uL (ref 150–400)
RBC: 3.78 MIL/uL — ABNORMAL LOW (ref 3.87–5.11)
RDW: 13.3 % (ref 11.5–15.5)
WBC: 5.5 K/uL (ref 4.0–10.5)
nRBC: 0 % (ref 0.0–0.2)

## 2024-06-14 LAB — MAGNESIUM: Magnesium: 1.6 mg/dL — ABNORMAL LOW (ref 1.7–2.4)

## 2024-06-14 LAB — LEVETIRACETAM LEVEL: Levetiracetam Lvl: 168 ug/mL — ABNORMAL HIGH (ref 10.0–40.0)

## 2024-06-14 LAB — CULTURE, RESPIRATORY W GRAM STAIN

## 2024-06-14 LAB — VITAMIN B12: Vitamin B-12: 1006 pg/mL — ABNORMAL HIGH (ref 180–914)

## 2024-06-14 LAB — PHOSPHORUS: Phosphorus: 1.9 mg/dL — ABNORMAL LOW (ref 2.5–4.6)

## 2024-06-14 MED ORDER — MAGNESIUM SULFATE 4 GM/100ML IV SOLN
4.0000 g | Freq: Once | INTRAVENOUS | Status: AC
  Administered 2024-06-14: 4 g via INTRAVENOUS
  Filled 2024-06-14: qty 100

## 2024-06-14 MED ORDER — GUAIFENESIN 100 MG/5ML PO LIQD: 5.0000 mL | ORAL | Status: DC | PRN

## 2024-06-14 MED ORDER — DEXTROSE-SODIUM CHLORIDE 5-0.45 % IV SOLN: INTRAVENOUS | Status: AC

## 2024-06-14 MED ORDER — FOLIC ACID 1 MG PO TABS
1.0000 mg | ORAL_TABLET | Freq: Every day | ORAL | Status: DC
  Administered 2024-06-15: 1 mg via ORAL
  Filled 2024-06-14: qty 1

## 2024-06-14 MED ORDER — DOXYCYCLINE HYCLATE 100 MG PO TABS
100.0000 mg | ORAL_TABLET | Freq: Two times a day (BID) | ORAL | Status: DC
  Administered 2024-06-14 – 2024-06-15 (×2): 100 mg via ORAL
  Filled 2024-06-14 (×3): qty 1

## 2024-06-14 MED ORDER — METOPROLOL TARTRATE 5 MG/5ML IV SOLN: 5.0000 mg | INTRAVENOUS | Status: DC | PRN

## 2024-06-14 MED ORDER — HYDRALAZINE HCL 20 MG/ML IJ SOLN: 10.0000 mg | INTRAMUSCULAR | Status: DC | PRN

## 2024-06-14 MED ORDER — THIAMINE MONONITRATE 100 MG PO TABS
100.0000 mg | ORAL_TABLET | Freq: Every day | ORAL | Status: DC
  Administered 2024-06-15: 100 mg via ORAL
  Filled 2024-06-14: qty 1

## 2024-06-14 MED ORDER — IPRATROPIUM-ALBUTEROL 0.5-2.5 (3) MG/3ML IN SOLN: 3.0000 mL | RESPIRATORY_TRACT | Status: DC | PRN

## 2024-06-14 MED ORDER — GLUCAGON HCL RDNA (DIAGNOSTIC) 1 MG IJ SOLR: 1.0000 mg | INTRAMUSCULAR | Status: DC | PRN

## 2024-06-14 MED ORDER — POTASSIUM PHOSPHATES 15 MMOLE/5ML IV SOLN
45.0000 mmol | Freq: Once | INTRAVENOUS | Status: AC
  Administered 2024-06-14: 45 mmol via INTRAVENOUS
  Filled 2024-06-14: qty 15

## 2024-06-14 MED ORDER — TRAZODONE HCL 50 MG PO TABS
50.0000 mg | ORAL_TABLET | Freq: Every evening | ORAL | Status: DC | PRN
  Administered 2024-06-14: 50 mg via ORAL
  Filled 2024-06-14: qty 1

## 2024-06-14 MED ORDER — ONDANSETRON HCL 4 MG/2ML IJ SOLN: 4.0000 mg | Freq: Four times a day (QID) | INTRAMUSCULAR | Status: DC | PRN

## 2024-06-14 NOTE — Telephone Encounter (Signed)
 Pharmacy Patient Advocate Encounter   Received notification from Inpatient Request that prior authorization for Valtoco  15mg  dose is required/requested.   Insurance verification completed.   The patient is insured through [facility] .   Per test claim: PA required; PA submitted to above mentioned insurance via Latent Key/confirmation #/EOC [facility] Status is pending

## 2024-06-14 NOTE — Plan of Care (Signed)
  Problem: Skin Integrity: Goal: Risk for impaired skin integrity will decrease Outcome: Progressing   Problem: Activity: Goal: Risk for activity intolerance will decrease Outcome: Progressing   Problem: Elimination: Goal: Will not experience complications related to bowel motility Outcome: Progressing   Problem: Pain Managment: Goal: General experience of comfort will improve and/or be controlled Outcome: Progressing

## 2024-06-14 NOTE — Progress Notes (Signed)
 PROGRESS NOTE    [name]  FMW:[id number] DOB: Feb 13, 1956 DOA: 06/11/2024 PCP: Lenon Nell SAILOR, FNP    Brief Narrative:  68 year old with history of seizure, medication noncompliance, alcohol  use, depression comes to the ED for change in mental status.  EMS witnessed multiple seizures.  Patient was admitted to the ICU intubated.  EEG did not demonstrate any further seizures therefore was discontinued.  Seen by neurologist recommended Keppra  and Vimpat .  Patient was eventually extubated on 8/25.  Started on 7 days of doxycycline  due to concerns of community-acquired pneumonia.   Assessment & Plan:  Principal Problem:   Convulsions, status epilepticus (HCC)    Status epilepticus with prior history of seizure disorder, likely due to noncompliance with medications Seen by neurology team, no further episodes of seizures noted.  This could have been likely due to medication noncompliance or alcohol  use.  Current regimen adjusted.  Now on Keppra  and Onfi  at bedtime   Acute respiratory failure with hypoxia: in the setting of status epilepticus Concern for CAP Currently on p.o. doxycycline .  Complete total 7 days of this.   Alcohol  abuse disorder Monitor for any withdrawal.    Major depressive disorder Resume home gabapentin    Right upper arm pain:  X-ray of the right shoulder shows remote injury but no acute pathology  Hypokalemia/hypomagnesemia/hypophosphatemia - As needed repletion  Speech and swallow evaluation  DVT prophylaxis: Lovenox     Code Status: Full Code Family Communication:   Status is: Inpatient Remains inpatient appropriate because: Continue hospital stay, hopefully discharge in next 24-48 hours   PT Follow up Recs: Other (Comment) (Prior Status And Home Set-Up Unknown As Pt Is Confused And Not Reliable)06/12/2024 1411  Subjective: Seen at bedside sitting up in the recliner.  No new complaints.   Examination:  General exam: Appears calm and  comfortable  Respiratory system: Clear to auscultation. Respiratory effort normal. Cardiovascular system: S1 & S2 heard, RRR. No JVD, murmurs, rubs, gallops or clicks. No pedal edema. Gastrointestinal system: Abdomen is nondistended, soft and nontender. No organomegaly or masses felt. Normal bowel sounds heard. Central nervous system: Alert and oriented. No focal neurological deficits. Extremities: Symmetric 5 x 5 power. Skin: No rashes, lesions or ulcers Psychiatry: Judgement and insight appear normal. Mood & affect appropriate.                Diet Orders (From admission, onward)     Start     Ordered   06/14/24 1200  Diet regular Room service appropriate? Yes; Fluid consistency: Thin  Diet effective now       Question Answer Comment  Room service appropriate? Yes   Fluid consistency: Thin      06/14/24 1159            Objective: Vitals:   06/14/24 0900 06/14/24 1000 06/14/24 1100 06/14/24 1150  BP: (!) 148/116 (!) 141/78 120/72   Pulse: 98 71 71   Resp: 20 11 16    Temp:    98.2 F (36.8 C)  TempSrc:    Oral  SpO2: 99% 99% 97%   Weight:      Height:        Intake/Output Summary (Last 24 hours) at 06/14/2024 1205 Last data filed at 06/13/2024 1700 Gross per 24 hour  Intake 225.26 ml  Output 250 ml  Net -24.74 ml   Filed Weights   06/11/24 0800 06/12/24 0422 06/14/24 0500  Weight: 72.6 kg 66.9 kg 63.1 kg    Scheduled Meds:  Chlorhexidine   Gluconate Cloth  6 each Topical Daily   cloBAZam   5 mg Oral QHS   docusate sodium   100 mg Oral BID   enoxaparin  (LOVENOX ) injection  40 mg Subcutaneous Q24H   famotidine   20 mg Oral BID   folic acid   1 mg Intravenous Daily   gabapentin   300 mg Oral TID   insulin  aspart  0-15 Units Subcutaneous Q4H   levETIRAcetam   1,000 mg Oral QHS   mupirocin  ointment  1 Application Nasal BID   polyethylene glycol  17 g Oral Daily   thiamine  (VITAMIN B1) injection  100 mg Intravenous Daily   Continuous Infusions:  dextrose  5  % and 0.45 % NaCl 75 mL/hr at 06/14/24 0812   doxycycline  (VIBRAMYCIN ) IV 100 mg (06/14/24 0109)   magnesium  sulfate bolus IVPB 4 g (06/14/24 1027)   potassium PHOSPHATE  IVPB (in mmol) 45 mmol (06/14/24 1012)    Nutritional status     Body mass index is 26.28 kg/m.  Data Reviewed:   CBC: Recent Labs  Lab 06/11/24 0845 06/11/24 0912 06/11/24 1211 06/12/24 0421 06/12/24 0429 06/13/24 0924 06/14/24 0811  WBC 20.2*  --  17.6* 11.7*  --  7.7 5.5  NEUTROABS 18.6*  --   --   --   --   --   --   HGB 14.1   < > 13.1 12.1 12.2 11.5* 13.1  HCT 43.1   < > 38.7 36.8 36.0 35.1* 40.2  MCV 107.8*  --  102.7* 107.3*  --  106.4* 106.3*  PLT 278  --  266 246  --  239 318   < > = values in this interval not displayed.   Basic Metabolic Panel: Recent Labs  Lab 06/11/24 0921 06/11/24 1116 06/11/24 1211 06/12/24 0421 06/12/24 0429 06/13/24 0924 06/14/24 0811  NA 140   < > 139 139 138 140 139  K 2.3*   < > 2.4* 4.3 4.4 3.5 3.3*  CL 103  --  101 109  --  108 107  CO2 19*  --  24 23  --  23 25  GLUCOSE 238*  --  125* 103*  --  91 77  BUN <5*  --  <5* 6*  --  7* <5*  CREATININE 0.82  --  0.58 0.81  --  0.64 0.61  CALCIUM  9.4  --  9.2 8.3*  --  7.9* 8.0*  MG  --   --   --  1.4*  --  1.9 1.6*  PHOS  --   --   --  2.1*  --  2.8 1.9*   < > = values in this interval not displayed.   GFR: Estimated Creatinine Clearance: 57.3 mL/min (by C-G formula based on SCr of 0.61 mg/dL). Liver Function Tests: Recent Labs  Lab 06/11/24 0921 06/13/24 0924 06/14/24 0811  AST 58* 176* 166*  ALT 27 45* 52*  ALKPHOS 86 65 67  BILITOT 1.2 1.2 1.2  PROT 6.9 5.3* 6.1*  ALBUMIN 3.9 2.4* 2.8*   No results for input(s): LIPASE, AMYLASE in the last 168 hours. Recent Labs  Lab 06/11/24 0833  AMMONIA 34   Coagulation Profile: No results for input(s): INR, PROTIME in the last 168 hours. Cardiac Enzymes: No results for input(s): CKTOTAL, CKMB, CKMBINDEX, TROPONINI in the last 168  hours. BNP (last 3 results) No results for input(s): PROBNP in the last 8760 hours. HbA1C: No results for input(s): HGBA1C in the last 72 hours. CBG: Recent Labs  Lab  06/13/24 1939 06/13/24 2339 06/14/24 0342 06/14/24 0757 06/14/24 1152  GLUCAP 86 81 76 71 127*   Lipid Profile: No results for input(s): CHOL, HDL, LDLCALC, TRIG, CHOLHDL, LDLDIRECT in the last 72 hours. Thyroid  Function Tests: No results for input(s): TSH, T4TOTAL, FREET4, T3FREE, THYROIDAB in the last 72 hours. Anemia Panel: Recent Labs    06/14/24 0811  VITAMINB12 1,006*   Sepsis Labs: Recent Labs  Lab 06/11/24 0753 06/11/24 1205  LATICACIDVEN 9.6* 2.7*    Recent Results (from the past 240 hours)  MRSA Next Gen by PCR, Nasal     Status: Abnormal   Collection Time: 06/11/24  9:21 AM   Specimen: Nasal Mucosa; Nasal Swab  Result Value Ref Range Status   MRSA by PCR Next Gen DETECTED (A) NOT DETECTED Final    Comment: RESULT CALLED TO, READ BACK BY AND VERIFIED WITH: RN MAGGIE C ON 06/12/24 @ 1434 BY DRT (NOTE) The GeneXpert MRSA Assay (FDA approved for NASAL specimens only), is one component of a comprehensive MRSA colonization surveillance program. It is not intended to diagnose MRSA infection nor to guide or monitor treatment for MRSA infections. Test performance is not FDA approved in patients less than 74 years old. Performed at [facility], [address] [place], [place] [address]   Expectorated Sputum Assessment w Gram Stain, Rflx to Resp Cult     Status: None   Collection Time: 06/12/24  8:52 AM   Specimen: Tracheal Aspirate; Sputum  Result Value Ref Range Status   Specimen Description TRACHEAL ASPIRATE  Final   Special Requests NONE  Final   Sputum evaluation   Final    THIS SPECIMEN IS ACCEPTABLE FOR SPUTUM CULTURE Performed at [facility], [address], [place], [place] [address]    Report Status 06/12/2024 FINAL  Final  Culture,  Respiratory w Gram Stain     Status: None   Collection Time: 06/12/24  8:52 AM   Specimen: Tracheal Aspirate  Result Value Ref Range Status   Specimen Description TRACHEAL ASPIRATE  Final   Special Requests NONE Reflexed from [id number]  Final   Gram Stain   Final    ABUNDANT WBC PRESENT, PREDOMINANTLY PMN ABUNDANT GRAM POSITIVE COCCI IN CLUSTERS Performed at [facility], [address] [place], [place] [address]    Culture   Final    ABUNDANT METHICILLIN RESISTANT STAPHYLOCOCCUS AUREUS   Report Status 06/14/2024 FINAL  Final   Organism ID, Bacteria METHICILLIN RESISTANT STAPHYLOCOCCUS AUREUS  Final      Susceptibility   Methicillin resistant staphylococcus aureus - MIC*    CIPROFLOXACIN  >=8 RESISTANT Resistant     ERYTHROMYCIN  >=8 RESISTANT Resistant     GENTAMICIN <=0.5 SENSITIVE Sensitive     OXACILLIN >=4 RESISTANT Resistant     TETRACYCLINE <=1 SENSITIVE Sensitive     VANCOMYCIN 1 SENSITIVE Sensitive     TRIMETH /SULFA  <=10 SENSITIVE Sensitive     CLINDAMYCIN <=0.25 SENSITIVE Sensitive     RIFAMPIN <=0.5 SENSITIVE Sensitive     Inducible Clindamycin NEGATIVE Sensitive     LINEZOLID 2 SENSITIVE Sensitive     * ABUNDANT METHICILLIN RESISTANT STAPHYLOCOCCUS AUREUS         Radiology Studies: DG Humerus Right Result Date: 06/13/2024 CLINICAL DATA:  Arm pain. EXAM: RIGHT HUMERUS - 2+ VIEW COMPARISON:  None Available. FINDINGS: There is no evidence of fracture. No erosive change. Deformity of the superolateral humeral head is suggestive of remote injury. Elbow alignment is maintained.  Shoulder alignment is not well assessed on provided view. Soft tissues are unremarkable. IMPRESSION: 1. No acute findings. 2. Deformity of the superolateral humeral head is suggestive of remote injury. Electronically Signed   By: Andrea Gasman M.D.   On: 06/13/2024 13:10           LOS: 3 days   Time spent= 35 mins    Aurie Harroun JAYSON Dare, MD [facility]  If 7PM-7AM, please  contact night-coverage  06/14/2024, 12:05 PM

## 2024-06-14 NOTE — Telephone Encounter (Signed)
 Pharmacy Patient Advocate Encounter  Received notification from [employer] that Prior Authorization for Valtoco  15mg  dose has been APPROVED from 06/14/24 to 10/18/24   PA #/Case ID/Reference #: [id number]

## 2024-06-14 NOTE — Progress Notes (Signed)
 Patient noted to have IV infiltration of right forearm with swelling and tenderness. No erythema noted. Site marked. Aspiration attempted with little output. Pharmacy notified. IV running D5 1/2NS and potassium phos in D5 at time of infiltration. Pharmacy advised RN to elevate and use cold compress.

## 2024-06-14 NOTE — Progress Notes (Signed)
 Physical Therapy Treatment Patient Details Name: [name] MRN: [id number] DOB: [birth year] Today's Date: 06/14/2024   History of Present Illness 32 yoF w/ PMH presented 06/11/24 with AMS.  Witnessed multiple seizures.  Pt intubated 8/24-8/25 for airway protection and O2 desaturations. Continuous EEG placed; PMH: Seizure disorder, ETOH withdrawal, pancreatitis, anxiety, depression and SAH.    PT Comments  Patient a&ox4 and able to provide home setup and prior functional status. Overall, pt moving well with use of RW and anticipate will do well with her cane. No followup PT or new DME recommended. Will follow acutely.     If plan is discharge home, recommend the following: Assistance with cooking/housework;Assist for transportation;Help with stairs or ramp for entrance   Can travel by private vehicle        Equipment Recommendations  None recommended by PT    Recommendations for Other Services       Precautions / Restrictions Precautions Precautions: Fall;Other (comment) Precaution/Restrictions Comments: seizure; peripheral IV L foot; Restrictions Weight Bearing Restrictions Per Provider Order: No     Mobility  Bed Mobility Overal bed mobility: Needs Assistance Bed Mobility: Supine to Sit     Supine to sit: Supervision     General bed mobility comments: no physical assist; supervision for lines    Transfers Overall transfer level: Needs assistance Equipment used: Rolling walker (2 wheels) Transfers: Sit to/from Stand Sit to Stand: Contact guard assist           General transfer comment: from ICU bed and recliner; vc for sequencing with RW (pt typically uses cane)    Ambulation/Gait Ambulation/Gait assistance: Contact guard assist Gait Distance (Feet): 150 Feet Assistive device: Rolling walker (2 wheels) Gait Pattern/deviations: WFL(Within Functional Limits)   Gait velocity interpretation: >2.62 ft/sec, indicative of community ambulatory    General Gait Details: in/out of bathroom with sloped entry and floor without difficulty; anticipate can use cane next visit   Stairs             Wheelchair Mobility     Tilt Bed    Modified Rankin (Stroke Patients Only)       Balance Overall balance assessment: Needs assistance Sitting-balance support: No upper extremity supported, Feet unsupported Sitting balance-Leahy Scale: Fair     Standing balance support: No upper extremity supported Standing balance-Leahy Scale: Fair Standing balance comment: without UE support able to wash hands at sink; reach overhead; bring feet together and eyes closed x 10 sec                            Communication Communication Communication: No apparent difficulties  Cognition Arousal: Alert Behavior During Therapy: Flat affect   PT - Cognitive impairments: No apparent impairments                         Following commands: Intact      Cueing Cueing Techniques: Verbal cues, Gestural cues, Tactile cues  Exercises      General Comments        Pertinent Vitals/Pain Pain Assessment Pain Assessment: No/denies pain    Home Living Family/patient expects to be discharged to:: Other (Comment)                   Additional Comments: stays at [facility]; no steps to enter; one level; tub/shower    Prior Function            PT  Goals (current goals can now be found in the care plan section) Acute Rehab PT Goals Patient Stated Goal: return home with husband Time For Goal Achievement: 06/26/24 Potential to Achieve Goals: Good Progress towards PT goals: Progressing toward goals    Frequency    Min 2X/week      PT Plan      Co-evaluation              AM-PAC PT 6 Clicks Mobility   Outcome Measure  Help needed turning from your back to your side while in a flat bed without using bedrails?: A Little Help needed moving from lying on your back to sitting on the side of a flat  bed without using bedrails?: A Little Help needed moving to and from a bed to a chair (including a wheelchair)?: A Little Help needed standing up from a chair using your arms (e.g., wheelchair or bedside chair)?: A Little Help needed to walk in hospital room?: A Little Help needed climbing 3-5 steps with a railing? : A Little 6 Click Score: 18    End of Session Equipment Utilized During Treatment: Gait belt Activity Tolerance: Patient tolerated treatment well Patient left: with call bell/phone within reach;in chair;with chair alarm set Nurse Communication: Mobility status PT Visit Diagnosis: Unsteadiness on feet (R26.81);Other symptoms and signs involving the nervous system (R29.898)     Time: 9151-9087 PT Time Calculation (min) (ACUTE ONLY): 24 min  Charges:    $Gait Training: 23-37 mins PT General Charges $$ ACUTE PT VISIT: 1 Visit                      Macario RAMAN, PT Acute Rehabilitation Services  Office [phone]    Macario SHAUNNA Soja 06/14/2024, 9:22 AM

## 2024-06-14 NOTE — Progress Notes (Signed)
 Report given to [facility] RN. Patient transported to 5C18 with belongings including shoes, clothing, ring.

## 2024-06-14 NOTE — Hospital Course (Addendum)
 Brief Narrative:  68 year old with history of seizure, medication noncompliance, alcohol  use, depression comes to the ED for change in mental status.  EMS witnessed multiple seizures.  Patient was admitted to the ICU intubated.  EEG did not demonstrate any further seizures therefore was discontinued.  Seen by neurologist recommended Keppra  and Vimpat .  Patient was eventually extubated on 8/25.  Started on 7 days of doxycycline  due to concerns of community-acquired pneumonia. Patient was eventually transitioned to Keppra  and Onfi  at bedtime.  Valtoco  was prescribed for breakthrough seizures as well.  Doing significantly well today.  Will discharge in stable condition  Assessment & Plan:  Principal Problem:   Convulsions, status epilepticus (HCC)    Status epilepticus with prior history of seizure disorder, likely due to noncompliance with medications Seen by neurology team, no further episodes of seizures noted.  This could have been likely due to medication noncompliance or alcohol  use.  Current regimen adjusted.  Now on Keppra  and Onfi  at bedtime.  Valtoco  given as needed for breakthrough seizures.   Acute respiratory failure with hypoxia: in the setting of status epilepticus Concern for CAP Currently on p.o. doxycycline .  Complete total 7 days of this.   Alcohol  abuse disorder Monitor for any withdrawal.    Major depressive disorder Resume home gabapentin    Right upper arm pain:  X-ray of the right shoulder shows remote injury but no acute pathology  Hypokalemia/hypomagnesemia/hypophosphatemia - As needed repletion  Speech and swallow evaluation-on regular diet  DVT prophylaxis: Lovenox     Code Status: Full Code Family Communication:   Status is: Inpatient Remains inpatient appropriate because: discharge  PT Follow up Recs: Other (Comment) (Prior Status And Home Set-Up Unknown As Pt Is Confused And Not Reliable)06/12/2024 1411  Subjective: Seen at bedside no  complaints.  Examination:  General exam: Appears calm and comfortable  Respiratory system: Clear to auscultation. Respiratory effort normal. Cardiovascular system: S1 & S2 heard, RRR. No JVD, murmurs, rubs, gallops or clicks. No pedal edema. Gastrointestinal system: Abdomen is nondistended, soft and nontender. No organomegaly or masses felt. Normal bowel sounds heard. Central nervous system: Alert and oriented. No focal neurological deficits. Extremities: Symmetric 5 x 5 power. Skin: No rashes, lesions or ulcers Psychiatry: Judgement and insight appear normal. Mood & affect appropriate.

## 2024-06-14 NOTE — TOC Benefit Eligibility Note (Signed)
 Pharmacy Patient Advocate Encounter   Received notification from Inpatient Request that prior authorization for Valtoco  15mg  dose is required/requested.   Insurance verification completed.   The patient is insured through [place] .   Per test claim: PA required and submitted KEY/EOC/Request #: [id number] from 06/14/24 to 10/18/24. Ran test claim, Copay is $0. This test claim was processed through [facility]- copay amounts may vary at other pharmacies due to pharmacy/plan contracts, or as the patient moves through the different stages of their insurance plan.   [id number]

## 2024-06-14 NOTE — Progress Notes (Signed)
 Subjective: No appearance overnight.  Denies any other concerns.  ROS: negative except above  Examination  Vital signs in last 24 hours: Temp:  [97.9 F (36.6 C)-98.6 F (37 C)] 97.9 F (36.6 C) (08/27 0700) Pulse Rate:  [69-93] 78 (08/27 0800) Resp:  [10-21] 14 (08/27 0800) BP: (102-150)/(64-111) 147/82 (08/27 0800) SpO2:  [92 %-100 %] 100 % (08/27 0800) Arterial Line BP: (118-155)/(80-83) 155/83 (08/26 1000) Weight:  [63.1 kg] 63.1 kg (08/27 0500)  General: lying in bed, NAD Neuro: MS: Alert, oriented, follows commands CN: pupils equal and reactive,  EOMI, face symmetric, tongue midline, normal sensation over face, Motor: 5/5 strength in all 4 extremities Coordination: normal Gait: not tested  Basic Metabolic Panel: Recent Labs  Lab 06/11/24 0921 06/11/24 1116 06/11/24 1211 06/12/24 0421 06/12/24 0429 06/13/24 0924 06/14/24 0811  NA 140   < > 139 139 138 140 139  K 2.3*   < > 2.4* 4.3 4.4 3.5 3.3*  CL 103  --  101 109  --  108 107  CO2 19*  --  24 23  --  23 25  GLUCOSE 238*  --  125* 103*  --  91 77  BUN <5*  --  <5* 6*  --  7* <5*  CREATININE 0.82  --  0.58 0.81  --  0.64 0.61  CALCIUM  9.4  --  9.2 8.3*  --  7.9* 8.0*  MG  --   --   --  1.4*  --  1.9 1.6*  PHOS  --   --   --  2.1*  --  2.8 1.9*   < > = values in this interval not displayed.    CBC: Recent Labs  Lab 06/11/24 0845 06/11/24 0912 06/11/24 1211 06/12/24 0421 06/12/24 0429 06/13/24 0924 06/14/24 0811  WBC 20.2*  --  17.6* 11.7*  --  7.7 5.5  NEUTROABS 18.6*  --   --   --   --   --   --   HGB 14.1   < > 13.1 12.1 12.2 11.5* 13.1  HCT 43.1   < > 38.7 36.8 36.0 35.1* 40.2  MCV 107.8*  --  102.7* 107.3*  --  106.4* 106.3*  PLT 278  --  266 246  --  239 318   < > = values in this interval not displayed.     Coagulation Studies: No results for input(s): LABPROT, INR in the last 72 hours.  Imaging No new brain imaging overnight       ASSESSMENT AND PLAN:68 y.o. female with  breakthrough seizures presenting as status epilepticus(recurrent seizures without return to baseline) in the setting of suspected noncompliance.     Epilepsy with status epilepticus (resolved ) - Likely due to medication non compliance and alcohol  use - Continue Keppra  1000 mg extended release at bedtime and Onfi  5 mg at bedtime - Rescue medication: Valtoco  15 mg to be used for seizure lasting more than 2 minutes.  Apply for prior authorization.  If not approved, recommend clonazepam 2 mg instead -Recommend follow-up with neurology.  Order placed -Discussed with Dr. Caleen at bedside   Seizure precautions: Per [place]  DMV statutes, patients with seizures are not allowed to drive until they have been seizure-free for six months and cleared by a physician    Use caution when using heavy equipment or power tools. Avoid working on ladders or at heights. Take showers instead of baths. Ensure the water temperature is not too high on  the home water heater. Do not go swimming alone. Do not lock yourself in a room alone (i.e. bathroom). When caring for infants or small children, sit down when holding, feeding, or changing them to minimize risk of injury to the child in the event you have a seizure. Maintain good sleep hygiene. Avoid alcohol .    If patient has another seizure, call 911 and bring them back to the ED if: A.  The seizure lasts longer than 5 minutes.      B.  The patient doesn't wake shortly after the seizure or has new problems such as difficulty seeing, speaking or moving following the seizure C.  The patient was injured during the seizure D.  The patient has a temperature over 102 F (39C) E.  The patient vomited during the seizure and now is having trouble breathing    During the Seizure   - First, ensure adequate ventilation and place patients on the floor on their left side  Loosen clothing around the neck and ensure the airway is patent. If the patient is clenching the teeth, do  not force the mouth open with any object as this can cause severe damage - Remove all items from the surrounding that can be hazardous. The patient may be oblivious to what's happening and may not even know what he or she is doing. If the patient is confused and wandering, either gently guide him/her away and block access to outside areas - Reassure the individual and be comforting - Call 911. In most cases, the seizure ends before EMS arrives. However, there are cases when seizures may last over 3 to 5 minutes. Or the individual may have developed breathing difficulties or severe injuries. If a pregnant patient or a person with diabetes develops a seizure, it is prudent to call an ambulance.    After the Seizure (Postictal Stage)   After a seizure, most patients experience confusion, fatigue, muscle pain and/or a headache. Thus, one should permit the individual to sleep. For the next few days, reassurance is essential. Being calm and helping reorient the person is also of importance.   Most seizures are painless and end spontaneously. Seizures are not harmful to others but can lead to complications such as stress on the lungs, brain and the heart. Individuals with prior lung problems may develop labored breathing and respiratory distress.     I personally spent a total of 35 minutes in the care of the patient today including getting/reviewing separately obtained history, performing a medically appropriate exam/evaluation, counseling and educating, placing orders, referring and communicating with other health care professionals, documenting clinical information in the EHR, independently interpreting results, and coordinating care.       [facility] For questions after 5pm please refer to AMION to reach the Neurologist on call

## 2024-06-14 NOTE — Progress Notes (Signed)
 Speech Language Pathology Treatment: Dysphagia  Patient Details Name: [name] MRN: [id number] DOB: 1956-02-03 Today's Date: 06/14/2024 Time: 8850-8798 SLP Time Calculation (min) (ACUTE ONLY): 12 min  Assessment / Plan / Recommendation Clinical Impression  Patient seen by SLP for skilled treatment focused on dysphagia goals. When SLP arrived into room, she was sitting in recliner chair, was awake and RN was in room looking at her IV. Patient's voice was clear, strong and oral cavity appeared moist and clean. She is not c/o odynophagia as she had been during initial evaluation two days ago. SLP assessed her toleration of thin liquids and regular solids. Mastication was [facility] and patient not c/o any difficulty or pain. SLP did discuss her prior comments regarding difficulty chewing because of dentition, but patient is denying this has occurred recently. No overt s/s aspiration with solids or liquids observed and patient feeding self. SLP will f/u at least once to ensure toleration.    HPI HPI: 68 yo F presenting 06/11/24 with AMS after multiple witnessed seizures.  ETT 8/24-8/25. Continuous EEG placed. PMH: Seizure disorder, ETOH withdrawal, pancreatitis, anxiety, depression and SAH. Previous swallow evals in the setting of breakthrough seizures in June 2023 and August 2023 both recommended Dys 2/thin liquids due to AMS and dental problems.      SLP Plan  Continue with current plan of care          Recommendations  Diet recommendations: Regular;Thin liquid Liquids provided via: Cup;Straw Medication Administration: Other (Comment) (as tolerated) Supervision: Patient able to self feed Compensations: Slow rate;Small sips/bites Postural Changes and/or Swallow Maneuvers: Seated upright 90 degrees                  Oral care BID   Set up Supervision/Assistance Dysphagia, unspecified (R13.10)     Continue with current plan of care   Norleen IVAR Blase, MA, CCC-SLP Speech  Therapy

## 2024-06-15 ENCOUNTER — Other Ambulatory Visit (HOSPITAL_COMMUNITY): Payer: Self-pay

## 2024-06-15 ENCOUNTER — Telehealth (HOSPITAL_COMMUNITY): Payer: Self-pay

## 2024-06-15 DIAGNOSIS — G40901 Epilepsy, unspecified, not intractable, with status epilepticus: Secondary | ICD-10-CM | POA: Diagnosis not present

## 2024-06-15 LAB — CBC
HCT: 35.1 % — ABNORMAL LOW (ref 36.0–46.0)
Hemoglobin: 11.8 g/dL — ABNORMAL LOW (ref 12.0–15.0)
MCH: 35.3 pg — ABNORMAL HIGH (ref 26.0–34.0)
MCHC: 33.6 g/dL (ref 30.0–36.0)
MCV: 105.1 fL — ABNORMAL HIGH (ref 80.0–100.0)
Platelets: 343 K/uL (ref 150–400)
RBC: 3.34 MIL/uL — ABNORMAL LOW (ref 3.87–5.11)
RDW: 13.2 % (ref 11.5–15.5)
WBC: 5 K/uL (ref 4.0–10.5)
nRBC: 0 % (ref 0.0–0.2)

## 2024-06-15 LAB — BASIC METABOLIC PANEL WITH GFR
Anion gap: 10 (ref 5–15)
BUN: <5 mg/dL — ABNORMAL LOW (ref 8–23)
CO2: 21 mmol/L — ABNORMAL LOW (ref 22–32)
Calcium: 8 mg/dL — ABNORMAL LOW (ref 8.9–10.3)
Chloride: 107 mmol/L (ref 98–111)
Creatinine, Ser: 0.6 mg/dL (ref 0.44–1.00)
GFR, Estimated: >60 mL/min (ref >60)
Glucose, Bld: 93 mg/dL (ref 70–99)
Potassium: 3.5 mmol/L (ref 3.5–5.1)
Sodium: 138 mmol/L (ref 135–145)

## 2024-06-15 LAB — MAGNESIUM: Magnesium: 1.9 mg/dL (ref 1.7–2.4)

## 2024-06-15 LAB — PHOSPHORUS: Phosphorus: 2.4 mg/dL — ABNORMAL LOW (ref 2.5–4.6)

## 2024-06-15 MED ORDER — POTASSIUM PHOSPHATES 15 MMOLE/5ML IV SOLN
15.0000 mmol | Freq: Once | INTRAVENOUS | Status: AC
  Administered 2024-06-15: 15 mmol via INTRAVENOUS
  Filled 2024-06-15: qty 5

## 2024-06-15 MED ORDER — DIAZEPAM (15 MG DOSE) 2 X 7.5 MG/0.1ML NA LQPK
15.0000 mg | NASAL | 5 refills | Status: AC | PRN
  Filled 2024-06-15: qty 5, 30d supply, fill #0

## 2024-06-15 MED ORDER — POLYETHYLENE GLYCOL 3350 17 GM/SCOOP PO POWD
17.0000 g | Freq: Every day | ORAL | 0 refills | Status: DC | PRN
  Filled 2024-06-15: qty 238, 14d supply, fill #0

## 2024-06-15 MED ORDER — CLOBAZAM 10 MG PO TABS
5.0000 mg | ORAL_TABLET | Freq: Every day | ORAL | 0 refills | Status: DC
  Filled 2024-06-15: qty 15, 30d supply, fill #0

## 2024-06-15 MED ORDER — DOXYCYCLINE HYCLATE 100 MG PO TABS
100.0000 mg | ORAL_TABLET | Freq: Two times a day (BID) | ORAL | 0 refills | Status: AC
  Filled 2024-06-15: qty 10, 5d supply, fill #0

## 2024-06-15 MED ORDER — LEVETIRACETAM ER 500 MG PO TB24
1000.0000 mg | ORAL_TABLET | Freq: Every day | ORAL | 0 refills | Status: DC
  Filled 2024-06-15: qty 60, 30d supply, fill #0

## 2024-06-15 NOTE — Progress Notes (Addendum)
 Transition of Care [facility]) - Inpatient Brief Assessment   Patient Details  Name: [name] MRN: [id number] Date of Birth: January 11, 1956  Transition of [facility]) CM/SW Contact:    Rosaline JONELLE Joe, RN Phone Number: 06/15/2024, 9:47 AM   Clinical Narrative: Patient admitted to hospital with seizures - plans to return to hotel when medically stable.   Patient was evaluated by PT and OPPT was recommended since patient is independent.  LCSW provided resources to the patient for follow up for counseling for ETOH abuse.  OPPT referral placed for [address] location, No DME needed - MD to co-sign.  No other IP Care management needs at this time.  06/15/24 1129 - Charge RN states that patient will discharge home after receiving IVF over the next 5 hours.  Patient requiring transportation assistance to home.  Taxi voucher sent to the charge RN for discharge later since patient will not be discharging through discharge lounge.   Transition of Care Asessment: Insurance and Status: (P) Insurance coverage has been reviewed Patient has primary care physician: (P) Yes Home environment has been reviewed: (P) from hotel Prior level of function:: (P) self Prior/Current Home Services: (P) No current home services Social Drivers of Health Review: (P) SDOH reviewed interventions complete Readmission risk has been reviewed: (P) Yes Transition of care needs: (P) transition of care needs identified, TOC will continue to follow

## 2024-06-15 NOTE — Telephone Encounter (Signed)
 Pharmacy Patient Advocate Encounter   Received notification from Inpatient Request that prior authorization for cloBAZam  10MG  tablets is required/requested.   Insurance verification completed.   The patient is insured through [employer] .   Per test claim: PA required; PA submitted to above mentioned insurance via Latent Key/confirmation #/EOC [id number] Status is pending

## 2024-06-15 NOTE — Discharge Instructions (Signed)
 [employer] assistance programs Crisis assistance programs  -Part[facility] End[facility] Homelessness Arts development officer. If you are experiencing homelessness in [place], [place] , your first point of contact should be Pensions consultant. You can reach Coordinated Entry by calling (336) ([phone] or by emailing [email or web address].  Community access points: [employer] [phone], [place]) every Tuesday from 9am-10am. [facility] ([address] [place]. [address] [place]) every Wednesday from 8am-9am.   -[place] Coordinated Re-entry Daniel Mcalpine: Dial 211 and request. Offers referrals to homeless shelters in the area.    -The [employer] 508-104-8561) offers several services to local families, as funding allows. The Emergency Assistance Program (EAP), which they administer, provides household goods, free food, clothing, and financial aid to people in need in the [place]  area. The EAP program does have some qualification, and counselors will interview clients for financial assistance by written referral only. Referrals need to be made by the Department of Social Services or by other EAP approved human services agencies or charities in the area.  -Open Door Ministries of [employer], which can be reached at (762)818-6460, offers emergency assistance programs for those in need of help, such as food, rent assistance, a soup kitchen, shelter, and clothing. They are based in [facility]  but provide a number of services to those that qualify for assistance.   [facility] Department of Social Services may be able to offer temporary financial assistance and cash grants for paying rent and utilities, Help may be provided for local county residents who may be experiencing personal crisis when other resources, including government programs, are not available. Call [phone]  -High [employer] Army is a [employer] agency, The organization can offer emergency assistance for paying rent, Caremark Rx, utilities, food, household products and furniture. They offer extensive emergency and transitional housing for families, children and single women, and also run a Boy's and [employer]. Thrift Shops, Secondary school teacher, and other aid offered too. [address], [place], [place]  [address], ([phone]  -[place] Low Income Energy Assistance Program -- This is offered for [facility] families. The federal government created [employer] Program provides a one-time cash grant payment to help eligible low-income families pay their electric and heating bills. [address], [place], [place]  [address], [phone]  -[place] Emergency Assistance -- A program offers emergency utility and rent funds for greater [employer] area residents. The program can also provide counseling and referrals to charities and government programs. Also provides food and a free meal program that serves lunch Mondays - Saturdays and dinner seven days per week to individuals in the community. [address] [place], [place]  [address], [phone]  -[employer] - Offers affordable apartment and housing communities across      [place] and [place]. The low income and seniors can access public housing, rental assistance to qualified applicants, and apply for the section 8 rent subsidy program. Other programs include Chiropractor and Engineer, maintenance. [address] [place], [place]  [address], dial [phone].  -The [facility] provides transitional housing to veterans and the disabled. Clients will also access other services too, including assistance in applying for Disability, life skills classes, case management, and assistance in finding permanent housing. [address], [place], [place]  [place] [address], call [phone]  -[place] Transitional Housing through [facility] is for people who were just  evicted or that are formerly homeless. The non-profit will also help then gain self-sufficiency, find a home or apartment to live in, and also provides information on rent assistance when needed. Phone [phone]  -The [place]  [employer] Financial Program helps low income, elderly, or disabled residents in seven counties in the [place]  ([place], [place], [place], [place], [place], [place], [place], and [place]) save energy and reduce their utility bills by improving energy efficiency. Phone [phone].  -[employer] is located in the [place] [facility] in the [employer], [address], [place], [place] [address]. Parking is in the rear of the building. Phone: [phone]   General Email: info@gsohc .org  GHC provides free housing counseling assistance in locating affordable rental housing or housing with support services for families and individuals in crisis and the chronically homeless. We provide potential resources for other housing needs like utilities. Our trained counselors also work with clients on budgeting and financial literacy in effort to empower them to take control of their financial situations. [employer] collaborates with homeless service providers and other stakeholders as part of the [employer] COC (Continuum of Care). The (COC) is a regional/local planning body that coordinates housing and services funding for homeless families and individuals. The role of [facility]HC in the COC is through housing counseling to work with people we serve on diversion strategies for those that are at imminent risk of becoming homeless. We also work with the Coordinated Assessment/Entry Specialist who attempts to find temporary solutions and/or connects the people  to Housing First, Rapid Re-housing or transitional housing programs. Our Homelessness Prevention Housing Counselors meet with clients on business days (Monday-Fridays, except scheduled holidays) from 8:30 am to 4:30 pm.  Legal assistance for evictions, foreclosure, and more -If you need free legal advice on civil issues, such as foreclosures, evictions, Electronics engineer, government programs, domestic issues and more, Landscape architect of [place]  [facility]) is a Associate Professor firm that provides free legal services and counsel to lower income people, seniors, disabled, and others, The goal is to ensure everyone has access to justice and fair representation. Call them at ([phone].  [facility] for Housing and Community Studies can provide info about obtaining legal assistance with evictions. Phone [phone].  Data processing manager  The [employer], [employer]. offers job and Dispensing optician. Resources are focused on helping students obtain the skills and experiences that are necessary to compete in today's challenging and tight job market. The non-profit faith-based community action agency offers internship trainings as well as classroom instruction. Classes are tailored to meet the needs of people in the [facility] region. [place], [place] [address], [phone]  Foreclosure prevention/Debt Services Family Services of the [employer] Credit Counseling Service inludes debt and foreclosure prevention programs for local families. This includes money management, financial advice, budget review and development of a written action plan with a Pensions consultant to help solve specific individual financial problems. In addition, housing and mortgage counselors can also provide pre- and post-purchase homeownership counseling, default resolution counseling (to prevent foreclosure) and reverse mortgage counseling. A Debt Management Program allows  people and families with a high level of credit card or medical debt to consolidate and repay consumer debt and loans to creditors and rebuild positive credit ratings and scores. Contact (336) D7650557.  Community clinics in [place] -Health Department [facility] Clinic: [address], [place], [address]. [phone].  -Health Department [place] Clinic: [phone]  [address], [facility], [address]. [phone].  -[facility] offers medical care through a group of doctors, pharmacies and other healthcare related agencies that offer services for low income, uninsured adults in [place]. Also offers adult Dental care and assistance with applying for an [employer]. Call [phone].   [facility]. This center provides low-cost health care to those without health insurance. Services offered include an onsite pharmacy. Phone ([phone]. [address], [address], [place].  -Medication Assistance Program serves as a link between pharmaceutical companies and patients to provide low cost or free prescription medications. This service is available for residents who meet certain income restrictions and have no insurance coverage. PLEASE CALL [phone] [place]) OR ([phone] ([place])  -One Step Further: Materials engineer, The [employer] Support & Nutrition Program, [employer]. Call [phone]/ [phone].  Food pantry and assistance -Urban Ministry-Food Bank: [address], [place] [address]. Phone [phone]  -Blessed Table Food Pantry: [address] [place], [place] [address]. [phone].  -Missionary Ministry: has the purpose of visiting the sick and shut-ins and provide for needs in the surrounding communities. Call [phone]. Email: [email or web address] This program provides: Food box for seniors, Financial assistance, Food to meet basic  nutritional needs.  -Meals on Wheels with Senior Resources: [facility] residents age 7 and over who are homebound and unable to obtain and prepare a nutritious meal for themselves are eligible for this service. There may be a waiting list in certain parts of [facility] if the route in that area is full. If you are in [facility] and [place] call [phone] to register. For all other areas call [phone] to register.  -Greater Dietitian: https://findfood.BargainContractor.[email or web address]  TRANSPORTATION: -[employer] Us Department of Health: Call [facility] and [employer] at [phone] for details. AttractionGuides.es  -Access [place]: Access [place] is the [employer] Agency's shared-ride transportation service for eligible riders who have a disability that prevents them from riding the fixed route bus. Call [phone]. Access [place] riders must pay a fare of $1.50 per trip, or may purchase a 10-ride punch card for $14.00 ($1.40 per ride) or a 40-ride punch card for $48.00 ($1.20 per ride).  -The Shepherd's WHEELS rideshare transportation service is provided for senior citizens (60+) who live independently within [place] city limits and are unable to drive or have limited access to transportation. Call [phone] to schedule an appointment.  -[place] Transportation: For Medicare or Medicaid recipients call [phone]?S[facility]RA Ambulance, wheelchair fleeta, and ambulatory quotes available.   FLEEING VIOLENCE: -Family Services of the [place]- 24/7 Crisis line [phone]) -[facility] Justice Centers: (336) [phone]-[phone] [phone])  [place] 2-1-1 is another useful way to locate resources in the community. Visit ShedSizes.ch to find service information online. If you need additional assistance, 2-1-1 Referral Specialists are available 24 hours a day, every day by dialing  2-1-1 or [phone] from any phone. The call is free, confidential, and available in any language.  Affordable Housing Search http://www.nchousingsearch.[facility])   M-F 8a-3p [address] [place]  [place], [place] [address] [phone] Services include: laundry, barbering, support groups, case management, phone & computer access, showers, AA/NA mtgs, mental health/substance abuse nurse, job skills class, disability information, VA assistance, spiritual classes, etc. Winter Shelter available when temperatures are less than 32 degrees.   HOMELESS SHELTERS [facility] at [facility]- Call [phone] ext. [phone]  or ext. [phone]. Located at [address] [place], [place] [address]  [facility] Mens Shelter- Call [phone]. Located at [address] [place] [address].  [facility]- [employer]. Call [phone]. Office located at [address], [place] [address].  Pathways Family Housing through [place] [phone].  [facility] Family Shelter- Call [phone]. Located at [address] [place], [place], [place] [address].  Room at the Inn-For Pregnant mothers. Call [phone]. Located at [address] [place], [address].  [facility] of Hope-For men in [place]. Call [phone]. [address]-Shelter in [place]. Call [phone].  Home of [employer] for [employer] [phone]. Office located at [address] [place], [address].  [employer] be agreeable to help with chores. Call [phone] ext. [phone].  Men's: [address] [place], [place] [address]. Women's: [facility]  [address] [place], [place] [address]  Crisis Services Therapeutic Alternatives Mobile Crisis Management- [phone]  [facility] [address], [place], [place] [address]. Phone: [phone] Rent/Utility Assistance in  [facility]:  INNOVATIVE PATHWAYS [address], [place], [place] [address] [phone] Mon 8:00am - 6:00pm; Tue 8:00am - 6:00pm; Wed 8:00am - 6:00pm; Thu 8:00am - 6:00pm; Fri 8:00am - 6:00pm; Email: [email or web address] Eligibility: Residents of [place], [place], [place], [place], [place] and [place] that meet income limits. Call or text for eligibility screening.   [facility] MINISTRY [address] [place], [place], [place] [address] [phone] (Main: Rental Assistance) [phone] (Main: Utility Assistance) Mon 8:30am - 5:00pm; Tue 8:30am - 5:00pm; Wed 8:30am - 5:00pm; Thu 8:30am - 5:00pm; Fri 8:30am - 5:00pm; Website: http://www.greensborourbanministry.org/emergency-assistance-program Eligibility: People who have an unexpected crisis or emergency that can be verified. Must have some form of income and meet income limits. At the first of the month, only helps with rent/mortgage assistance for those who have court ordered eviction notices. Call for application information. Call for exact documents that will be needed. Examples of documents that may be needed: Photo ID, Social Security cards for everyone in the household, and proof of income for previous 2 months. Copy of eviction notice for rent assistance and copy of final notice for utility assistance. Statements or receipts of bills for previous 2 months.   [facility] - [place] [address] [place], [place] [address] [phone] (Main) [phone] (Alternate) Mon 9:00am - 5:00pm; Tue 9:00am - 5:00pm; Wed 9:00am - 5:00pm; Thu 9:00am - 5:00pm; Fri 9:00am - 5:00pm; Website: http://[email or web address].salvationarmy.org/[place]/emergency-financial-assistance Email: [email or web address] Eligibility: People experiencing a housing crisis with past-due rent and/or utilities and meet income limits. Must be willing to take part in 6 Call or visit website to download  application. Return complete application by mail or email only. Documents: Help with Utilities: Photo ID, proof of household income, copies of monthly bills or receipts, and a final disconnection/shut-off notice. Help with Rent or Mortgage: Photo ID, proof of income, copies of monthly bills or receipts, and eviction notice. Help with Household Goods: Photo ID, proof of household income, copies of monthly bills or receipts, and a fire or flood report.  [facility] - [place] [address], [place], [place] [address] [phone] (Main) Mon 8:00am - 5:00pm; Tue 8:00am - 5:00pm; Wed 8:00am - 5:00pm; Thu 8:00am - 5:00pm; Fri 8:00am - 12:00pm; Website: http://[email or web address].salvationarmy.org/[place]/emergency-financial-assistance Email: [email or web address] Call for eligibility information. Apply :Utilities Assistance: Visit office by 8:30am on 1st and 4th Monday of each  month to pick up application. Rent and Mortgage Assistance: Visit office by 8:30am on 2nd and 3rd Monday of each month to pick up application. NOTE: If Monday falls on a holiday applications can be picked up the following Tuesday. Documents required will be listed on application.  [facility] [phone] (Main) Seen by appointment only. Call for more information. Eligibility: Meet income limits. Apply: Call for information on how to schedule an appointment. Each month there is a specific day to call to schedule an appointment. It is stated on the agency voicemail message. Appointments fill up quickly each month. Documents: Photo ID, copy of current utility bill.  [facility] HANDS [place] [address], [place], [place] [address] [phone] (Main) Tue 9:00am - 4:00pm; Wed 9:00am - 4:00pm; Thu 9:00am - 4:00pm; Website: http://www.[email or web address] Email: [email or web address] [email or web address] Eligibility: Utility Assistance: Meet income limits and be a Haematologist. [employer] customers do not qualify. Must not have received utility assistance for another agency within the last 90 days. Rent Assistance: Residents of [employer] who meet income limits. Must not have received rent assistance for another agency within the last 90 days. Apply: Call to schedule an appointment. Documents: Utility Assistance: Photo ID, City of [employer], copy of lease (if not paying a mortgage), proof of income, and monthly expenses. Rent Assistance: Photo ID, W-9 from the landlord, copy of the lease, proof of income, and a list of monthly expenses.  OPEN [facility]OR MINISTRIES - [place] [address], [place], [place] [address] [phone] (Main: Help With Rent) [phone] (Main: Help With Utilities) Mon 9:00am - 4:00pm; Tue 9:00am - 4:00pm; Wed 9:00am - 4:00pm; Thu 9:00am - 4:00pm; Fri 9:00am - 4:00pm; Website: MotivationalSites.no Email: [email or web address] Eligibility: People experiencing a financial crisis. Apply: Call to schedule an appointment Wednesday, 7:30am. Documents: Photo ID, Social Security card, proof of income, and proof of address. Other documents may be required, depending on service. Call for more information.  LOW INCOME ENERGY ASSISTANCE PROGRAM DEPARTMENT OF SOCIAL SERVICES - [facility] [address] [place], [place] [address] [phone] (Main) Mon 8:00am - 5:00pm; Tue 8:00am - 5:00pm; Wed 8:00am - 5:00pm; Thu 8:00am - 5:00pm; Fri 8:00am - 5:00pm; Website: [email or web address] Eligibility: Meet income limits and resource guidelines. Each household is only eligible once, even if multiple members apply. Apply: Call to see if funds are available. Visit to complete an application, call to have 1 mailed, or apply online at [email or web address] NOTE: Households with a person age 71  and over or a person with a documented disability can apply beginning December 1. Other households can apply beginning January 1. Documents: Photo ID, birth certificate, proof of household income, copy of utility bill, latest bank statement, the names and Social Security numbers for everyone in the household, and proof of disability if under age 71.  LOW INCOME ENERGY ASSISTANCE PROGRAM DEPARTMENT OF SOCIAL SERVICES - [facility] [address] [place], [place], [place] [address] [phone] (Main) Mon 8:00am - 5:00pm; Tue 8:00am - 5:00pm; Wed 8:00am - 5:00pm; Thu 8:00am - 5:00pm; Fri 8:00am - 5:00pm; Website: [email or web address] Eligibility: Meet income limits and resource guidelines. Each household is only eligible once, even if multiple members apply. Apply: Call to see if funds are available. Visit to complete an application, call to have 1 mailed, or apply online at [email or web address] NOTE: Households with a person age 56 and over or a person with a documented  disability can apply beginning December 1. Other households can apply beginning January 1. Documents: Photo ID, birth certificate, proof of household income, copy of utility bill, latest bank statement, the names and Social Security numbers for everyone in the household, and proof of disability if under age 50.  Homeless Shelter List:  Health and safety inspector [facility]) [address] [place], [place]  Phone: [phone]  [facility] [address], [place], [place] [address] Phone: [phone]  [facility] (Women only) [address] [place], [place] [address] Phone: [phone]  [facility] [address], [place] [address] Phone: [phone]  [facility] of Hope: [phone]. [address] [place], [place] [address] Phone: [phone]  [facility]  Shelter  [address] [place], [place] [address] (Check in at 6:00PM for placement at a local shelter) Phone: [phone]    [facility] MINISTRY Address: [address] [place], [place] [address] Phone Number: [phone] Hours of Operation: Residents of [place] can come to obtain food Monday through Friday from 8:30am until 3:30pm. Photo ID and Social Security cards required for all residents of a household. Can come six times a year  THE BLESSED TABLE Address: [address] [place], [place] [address] Phone Number: [phone] Hours of Operation: Operates Tuesday-Friday 10:00 a.m. to 1 p.m. Requirements: Referral from DSS needed. May come 6 times a year, 30 days apart. Photo ID and SS required for all residents of household.  [facility] MINISTRIES Address: [address] [place], [place] [address] Phone Number: [phone] Hours of Operation: Food pantry is open on the last Saturday of each month from 10:00 am - 12:00 noon. No appointment needed. No qualifications.  [facility] Address: [address] [place], [place] [address] Phone Number: [phone]. [phone] Hours of Operation: Must make reservations to pick up food on Saturdays. Sign ups for Saturday pick up beginning at 8:30 a.m. on Monday morning.  [facility] Address: [address] [place], [place] [address] Phone Number: [phone] Hours of Operation: If you need food, bring proper identification such as a driver's license to receive a bag of food once a month. Requirements: Can come once every 30 days with referral DSS, Holiday representative, Mental health etc. Each referral good for six visits. Photo ID required. *1st visit no referral required.  [facility] Address: [address] [place], [place] [address] Phone Number: [phone]  [place] [facility] Address: [address] [place], [place] [address] Phone  Number: [phone] Hours of Operation:  You can register at [email or web address]/ for free groceries  FREE INDEED FOOD PANTRY Address: [address] [place], [place] [address] Phone Number: [phone] Hours of Operation: Drive through giveaway, first come first served. Every 3rd Saturday 11AM - 1PM  [facility] [address] Address: [address] [place] [address] Phone Number: [phone]   [place]  HAND TO HAND FOOD PANTRY Address: [address] [place], [place] [address] Phone Number: [phone] Hours of Operation: Once a month every 3rd Saturday  [facility] Address: [address] [place], [place] [address] Phone Number: [phone] Hours of Operation: Distribution happens from 9:00-10:00 a.m. every Saturday.     [facility] Address: [address] [facility] [facility] [address] [place], [place] [address] Phone Number: [phone] Hours of Operation: ONCE a week for the community food distribution held every Tuesday, Wednesday and Thursday from 11 a.m. - 2:00 p.m.  Food is available on a first come, first serve basis and varies week to week. No appointment necessary for drive thru pick up.  [facility] Address: [address] [place], [place] [address] Phone Number: [phone] Hours of Operation: Open every 3rd Thursday 9:30 a.m. - 11:00 a.m.  [facility] Address: [address] [place], [place] [address] Phone Number: [phone] Hours of Operation: Please call for hours, directions, and questions  [facility] [place] [facility] Address: [address], [place], [place]  [address] Phone Number: [phone] Website: https://www.Hollyguns.co.za Food Finder app: https://findfood.ghpfa.org  [employer] Address: [address] [place], [place] [address] Phone Number: [phone] Hours of Operation: Contact Bree Harpe. Enrolled Substance Abuse Clients Only  [facility] Address: [address] [place], [address]  Phone Number: [phone] Hours of Operation: Contact Bartley Irving. Food pantry open the 3rd Saturday of each month from 9 a.m. -12 p.m. only  [place] [facility] Address: [address] [place], [place] [address] Phone Number: [phone] Hours of Operation: Contact Geni Lee. Emergency food bank open on Saturdays by appointment only  [facility] Address: [address] [place], [place] [address] Phone Number: [phone] Hours of Operation: No specific contact person; Anyone can help  [facility] Address: [address] [place], [place] [address] Phone Number: [phone] Hours of Operation: Contact Medford Molt. Agency gives out a bag of food every Thursday from 2-4 p.m. only, and also provides a community meal every Thursday between 5-6 p.m. Other services provided include rent/mortgage and utility assistance, women's winter shelter, thrift store, and senior adult activities.  [facility] [place] Address: [address] [place], [place] [address] Phone Number: [phone] Hours of Operation: The Emergency Food Assistance Program provides individuals and families with a generous supply of food including meat, fresh vegetables, and nonperishable items. The food box contains five days' worth of food, and each family or individual can receive a box once per month. M, W, Th, Fr 11am-2pm, walk-ins welcome.  [facility] Address: [address] [place], [place] [address]  Phone Number: [phone] Hours of Operation: Contact Asia Cathlean. Tuesdays and Thursdays from 11am - 3pm by appointment only  Sunday, by APPOINTMENT ONLY  [address] of [place], [address] [place], [address], [phone], 3.2 mi from [facility], call in advance for appointment at 10:00am or at 4:00pm, must provide valid photo ID  Monday  9:30am-5:00pm [facility], [address]  [place] [phone], [phone], 0.9 mi from [facility], can come four times per year, bring your photo ID and SS cards for other residents of household, will make appointments for those who work and need to come after 5pm  10:00am-12:00noon [employer], [address], [address], [phone], 1.7 mi from [facility], can come once every 60 days per household, need referral from DSS, [employer], etc., bring photo ID and SS card   10:00am-1:00pm Deitra [facility]  [address] [place], [address], [phone], 7.7 mi from [facility], can come once every thirty days with a referral from DSS, [employer], Mental Health, etc. -- each referral good for six visits, bring photo ID   10:00am-1:00pm [facility], [address] [address], [phone], 4.2 mi from [facility],  can come once every 6 months, open to [facility] residents, bring photo ID and copy of a current utility bill in your name, please call first to verify that food is available  6:30pm-8:30pm [facility], [address] [address], [phone], 3.2 mi from [facility], can come once every 30 days, maximum 6 times per year, bring your photo ID and SS numbers for other residents of household  Monday by APPOINTMENT ONLY  [facility], [address], [address],  [phone], 2.5 mi from [facility], call in advance for appointment between 10:00am-2:00pm, bring your photo ID and SS cards for all residents of household, can come once every 3 months  [facility], [address], [address], [phone], 0.7 mi from [facility], call in advance for appointment, can come once every 30 days, bring your photo ID and SS cards for other residents of household   Tuesday  9:00am-12:00noon [employer], [address] [address], [phone], 1.3 mi from [facility], can come once every 3  months, bring your photo ID and SS numbers for other residents of household   9:00am-1:00pm [facility] [address]  [place], [address], [phone]/Ext [phone], 1.6 mi from [facility], can come once every two weeks  9:30am-5:00pm [employer], [address] [place] [phone], [phone], 0.9 mi from [facility], can come four times per year, bring your photo ID and SS cards for other residents of household, will make appointments for those who work and need to come after 5pm  10:00am-12:00noon [employer], [address], [address], [phone], 1.7 mi from [facility], can come once every 60 days per household, need referral from DSS, [employer], etc., bring photo ID and SS card  10:00am-1:00pm [employer], [employer], [id number],  [phone], 3.8 mi from [facility], with referral from DSS, may come six times, 30 days apart, bring your photo ID and SS cards for all residents of household   10:00am-1:00pm [address] the [facility], [address] [place], [address], [phone], 7.7 mi from [facility], can come once every thirty days with a referral from DSS, [employer], Mental Health, etc.- each referral good for six visits, bring photo ID   10:00am-1:00pm [address], [address], [address], [phone], 4.2 mi from [facility], can come once every 6 months, open to [facility] residents, bring photo ID and copy of a current utility bill in your name, please call first to verify that food is available  2:00pm-3:30pm [facility], [address], [phone], [phone], 3.7 mi from [facility], can come twelve times per year, one bag per family, bring photo ID   FIRST AND THIRD Tuesdays  10:00am-1:00pm, [facility] [address], [address], [phone], 7.2 mi from [facility], can  come once every 30 days   Tuesday, WHEN FOOD IS AVAILABLE (call)  12:00noon-2:00pm [facility], [address] [address], [phone], 1.5 mi from  [facility], can come once every 30 days, bring photo ID   Tuesday, by APPOINTMENT ONLY  [facility], [address], [place],  [phone], 2.5 mi from [facility], call in advance for appointment between  1:00pm-4:00pm, bring your photo ID and SS cards for all residents of household, can come once every 3 months   [address] of [address], [address], [address], [phone], 5 mi from [facility], call one day ahead for appointment the next day between 10:00am and 12:00noon, can come once every 3 months, bring your photo ID and must qualify according to family income   [facility], [address], [address], [phone], 0.7 mi from [facility], call in advance for appointment, can come once every 30 days, bring your photo ID and SS cards for other residents of household   [address] of [place], [address], [address],  [phone], 5.1 mi from [facility], call between 9:00am and 1:00pm M-F to make appointment. Appointments are scheduled for Tues and Thurs from 10:00am-11:30am, bring photo ID, can come once every 6 months, limit three visits over 18 months, then must have referral  Wednesday  9:30am-5:00pm [facility], [address] [place] [address], [phone], 0.9 mi from [facility], can come four times per year, bring your photo ID and SS cards for other residents of household, will make appointments for those who work and need to come after 5pm  9:30am-11:30am [employer] [facility], [address], [address], [phone], 6.6 mi from [facility], can come once every 30 days, bring your photo ID, and SS cards for other residents of household, each monthly visit requires a written referral from GUM or  DSS with number in household on form  10:00am-12:00noon [address], [address]  [place], [address], [phone], 1.7 mi from [facility], can come once every 60 days per household, need referral from DSS, [employer], etc., bring photo ID and SS card  10:00am-1:00pm [facility], [id number],  [phone], 3.9 mi from [facility], with referral from DSS, may come six times, 30 days apart, bring your photo ID and SS cards for all residents of household   10:00am-1:00pm [address] the [facility], [address] [place], [address], [phone], 7.7 mi from [facility], can come once every thirty days with a referral from DSS, [employer], Mental Health, etc. - each referral good for six visits, bring photo ID   2:00pm-5:45pm [address] [place], [address], [phone], 3.2 mi from [facility], once every 30 days, first come/first served, limited to first 25, bring photo ID   6:30pm-8:30pm [facility], [address] [address], [phone], 3.2 mi from [facility], can come once every 30 days, maximum 6 times per year, bring your photo ID and SS numbers for other residents of household  FIRST and THIRD Wednesdays   9:00am-12:00noon, [facility], [address], [address], [phone], 4.2 mi from [facility], can come once a month. Please arrive and sign in no later than 11:15 so everyone can be served by 12 noon.  THIRD Wednesday  1:30pm-3:00pm, Mt. [address] [place], [address], [phone] or [phone], 2.1 mi from [facility]  Wednesday, by APPOINTMENT ONLY  [address] [facility], [address] [address], [phone], 5 mi from [facility], call one day ahead for appointment the next day between 10:00am and 12:00noon, can come once every 3 months, bring your photo ID  and must qualify according to  family income   [facility], [address], [address], [phone], 0.7 mi from [facility], call in advance for appointment, can come once every 30 days, bring your photo ID and SS cards for other residents of household   [address] of [place], [address] [place], [address], [phone], 3.2 mi from [facility], call in advance for appointment at 7:00pm, must provide valid photo ID  Thursday  9:00am-12:00noon [employer], [address], [address], [phone], 1.3 mi from [facility], can come once every 3 months, bring your photo ID and SS numbers for other residents of household   9:00am-1:00pm [facility] [address],  [place], [address], [phone]/Ext [phone], 1.6 mi from [facility], can come once every two weeks  9:30am-12:00noon [employer], [address], [address], [phone], 4.5 mi from [facility], can come once every 30 days, must state income [closed Thanksgiving and week of Christmas]  9:30am-5:00pm [employer], [address] [place] [phone], [phone], 0.9 mi from [facility], can come four times per year, bring your photo ID and SS card for other residents of household, will make appointments for those who work and need to come after 5pm  10:00am-1:00pm [employer], [address] [place], [id number],  [phone], 3.9 mi from [facility], with referral from DSS, may come six times, 30 days apart, bring your photo ID and SS cards for all residents of household   10:00am-1:00pm [address] the [facility], [address] [place], [address], [phone], 7.7 mi from [facility], can come once every thirty days with a referral from DSS, [employer], Mental Health, etc.- each referral good for six visits, bring photo ID   10:00am-1:00pm [facility], [address] [address], [phone], 4.2 mi from [facility], can come once every 6  months, open to [facility] residents, bring photo ID and copy of a current utility bill in your name, please call first to verify that food is available   SUNDAYS BREAKFAST TWO LOCATIONS: 8:00am served in [facility] by Awaken PPL Corporation 8:30am SHUTTLE provided from [facility], served at [employer] Corporation, [address] LUNCH TWO LOCATIONS [plus one additional third Sunday only] 10:30am - 12:30pm served at [employer], [employer], [place] [address] (1.2 miles from [facility]) 12:30pm served in [place] by Land O'Lakes Team (THIRD Sunday only) 1:30pm served at [facility] by [facility] one location [plus one additional third Sunday only] 5:00pm Every Sunday, served under the bridge at [address] by Dovie Under the 3M Company (.7 miles from [facility]) (THIRD Sunday ONLY) 4:00pm served in the parking garage, across from Nucor Corporation, corner of [place] and [place] by Ryland Group Works Ministries MONDAYS BREAKFAST 7:30am served in [employer] by the United States Steel Corporation and Friends LUNCH 10:30am - 12:30pm served at [employer], [employer], [place] [address] (1.2 miles from [facility]) DINNER TWO LOCATIONS: 7:00pm served in front of the courthouse at the corner of Goldman Sachs and Coca Cola. by [place] Monday Night Meal (3 blocks from [facility]) 4:30pm served at the AutoNation, [address] by Bank of [place] Company (0.6 miles from [facility]) Huntsman Corporation  8:00am - 9:00am served at The TJX Companies, [address] (0.3 miles from [place]) LUNCH 10:30am - 12:30pm served at the Ecol[employer], [employer] [address] (1.2 miles from [place]) DINNER 6:00pm served at CSX Corporation, enter from Capital One and go to the Sonic Automotive, (0.7 miles  from [place]) [facility] BREAKFAST 7:00am - 8:00am served at [employer], [employer] [address] (1.2 miles from [place]) LUNCH ONE LOCATION [plus two additional locations listed below] 10:30am - 12:30pm served at [employer], [employer] [address], (1.2 miles from [place]) (FIRST Wednesday ONLY) 11:30am served at Dillard's, [place] [address] (6.6 miles from [place]) (SECOND Wednesday ONLY) 11:00am served at [place]. Garnette Ava Blackwood of [address], [address] (1.3 miles from [place]) [place] TWO LOCATIONS 6:00pm served at W. R. Berkley, [place] W[address] Visteon Corporation. (1.3 miles from [facility]) 4:00pm - 6:00pm (hot dogs and chips) served at Levi Strauss of [facility], [address] (1.7 miles from [place]) [place] BREAKFAST NOT AVAILABLE AT THIS TIME LUNCH 10:30am - 12:30pm served at Ecol[employer], Liberty [employer], [place] [address], (1.2 miles from [place]) DINNER 6:00pm served at CSX Corporation, enter from Capital One and go to the Sonic Automotive, (0.7 miles from Nucor Corporation) [place] BREAKFAST NOT AVAILABLE AT THIS TIME LUNCH 10:30am - 12:30pm served at [employer], [employer] [address], (1.2 miles from [place]) DINNER TWO LOCATIONS, [plus one additional first Friday only] 6:00pm served under the bridge at [address] by Dovie Under CSX Corporation. (.7 miles from [facility]) 5:00pm - 7:00pm served at Levi Strauss of [facility], [address] (1.7 miles from [place]) (FIRST Friday ONLY) 5:45 pm - SHUTTLE provided from the LIBRARY at 5:45pm. Served at [facility], [address]. SATURDAYS BREAKFAST TWO LOCATIONS [plus one additional last Saturday only] 8:00am served at [facility] by Delphi 8:30am served at Pulte Homes, [address] (2.2 miles from [facility]) (LAST Saturday ONLY) 8:30am served at Beazer Homes, [address] (5 miles from [place]) LUNCH 10:30am - 12:30pm served at Ecolab, Liberty Global [address] (1.2 miles from [facility]) DINNER 6:00pm served under the bridge at [address] by World Fuel Services Corporation (0.7 miles from Nucor Corporation)  DIRECTIONS FROM [place] TO ALL MEAL LOCATIONS The Bridge at [address] (.7 miles from [address]) [address] on [place]. Turn Right onto DIRECTV 433 ft. Continue onto [address] under bridge, about 500 ft. Courthouse (3 blocks from [facility]) [place] on [address]. Turn right on [place]  1 block to PPL Corporation (.5 miles from [place]) [place] on [place]. YRC Worldwide. past Brink's Company to EMCOR. Enter from Capital One and go to the Affiliated Computer Services building W. R. Berkley [address] Visteon Corporation. (1.3 miles from [facility]) [address] on [place]. Turn Right onto [address]. [address]. church will be on the Left. The TJX Companies [address] (.3 miles from [facility]) Go .3  miles on W. Friendly Destination is on your right Dillard's at ONEOK (6.6 miles from [address]) [address] on [place] toward W Friendly Turn right onto Hershey Company Continue onto Alcoa Inc. Continue onto Toll Brothers. 5. Tommi is on right Sanmina-SCI Futures trader) [address] (.6 miles from [place]) [place] on [place]. [address] Turn Left onto [address] 0.3 miles Destination is on the Left. [place] Black & Decker at American Express (5 miles from Nucor Corporation) 1. Head south on [address]. Turn right onto W Friend[address] Turn slightly left onto Wachovia Corporation Continue onto Quest Diagnostics Turn right at Barnes & Noble Continue to church on right New Birth Sounds of [facility] [address] (1.7 miles from [place]) [address] on [place] 1.4 miles [place] becomes [place]. [address] Continue 0.6 miles and church will be on theright. [place] Guardian Life Insurance at [address] (2.5 miles from Nucor Corporation) Sells provided from Massachusetts Mutual Life Park] [place] on [place]. Elm toward Estée Lauder right onto Costco Wholesale left onto Emerson Electric Turn left onto Micron Technology [address] [place]  [address] (2.2 miles from [address]) [address] on [place] 1.4 miles [place] becomes [place]. [address] Turn right onto W. BB&T Corporation. and church will be on the Left. Potter's House/[place] AT&T [address] (1.2 miles from [facility]) 1.Turn right onto [facility] 2.Turn left onto GEANNIE Beagle 3.Micheline MICAEL Ruth 4.Destination is on your right [place]. Garnette Ava Tommi of [address] at ToysRus (1.3 miles from [facility]) [address] on [address] Turn left onto Genuine Parts right onto S. Maryellen Cassis. Continue onto KB Home	[place]. Turn left onto Smurfit-Stone Container. Turn right onto WellPoint. The First American Inpatient Behavioral Health/Residential Substance Abuse Treatment - Adults The United Way's "[id number]" is a great source of information about community services available.  Access by dialing 2-1-1 from anywhere in [place] , or by website -  PooledIncome.pl.    (Updated 01/2016)   Crisis Assistance 24 hours a day     Services Offered       Area [employer] Martin 24-hour crisis assistance: [phone] [place], [place]  [facility] 24-hour crisis assistance:[phone] [place], [place]  [place] 24-hour crisis assistance: [phone] [place], [place]  [facility] Access to Care Line 24-hour crisis assistance; [phone] All   [facility] 24-hour crisis response line: [phone] All    Other Local Resources (Updated 10/2015)   Inpatient Behavioral Health/Residential Substance Abuse Treatment Programs     Services          Address and Phone Number  [facility] (Alcohol  Drug Abuse Treatment Center)   14-day residential rehabilitation  [phone] [address] [place], [place]  [facility] ([facility])    Detox - private pay only 14-day residential rehabilitation -  Medicaid, insurance, private pay only [phone], or [phone] [address], [place], [place] [address]   [facility] only Multiple facilities [phone] admissions    [facility]ATS ([facility])   90-day program Must be homeless to participate   [phone], or [phone] Daniel Mcalpine, [facility]  [facility] only [phone], or  [phone] [address] [place], [place] [address]  [facility] Residential Treatment  Services Must make an appointment Accepts private pay, [facility] [phone]  [address] [place], [place] [address]   Dove's Nest Females only Associated with the [facility] [phone] [address] [place], [place] [address]  [facility]ship [facility] [place] only [phone], or [phone] [address] [place], [id number]  [facility]     Detox Residential rehabilitation Private insurance only Multiple locations [phone] admissions  [facility]     Private pay Private insurance [phone] [address] [place], [place] [address]  [facility]     Males only Fee required at time of admission [phone] [address] [place], [place] [address]  [facility] of [facility]     Private pay only   [phone] [address] [place], [place]  RTS (Residential Firefighter)    Detox - private pay,  Medicaid Residential rehabilitation for males  - Medicare, Medicaid, insurance, private pay [phone] [address] [place], [place]   UMNDJ              Walk-in interviews Monday - Saturday from 8 am - 4 pm Individuals with legal charges are not eligible [phone] [address] [place], [place] [address]  [facility]  Must be willing to work Must attend Alcoholics Anonymous meetings [phone] [address] [place], [place]   [facility] [employer]    Faith-based program Private pay only [phone] [address] [place], [place]                                        Outpatient Substance Abuse  Treatment- uninsured  Narcotics Anonymous 24-HOUR HELPLINE Pre-recorded for Meeting Schedules [facility] [phone]  [email or web address] ALCOHOLICS ANONYMOUS  [place]   Answering Service [phone] Please Note: All [place] Meetings are Non-smoking [email or web address].es  Alcohol  and Drug Services -  Insurance: Medicaid /State funding/private insurance Methadone, suboxone/Intensive outpatient  [place]   [phone] Fax: [phone] [address] [place], [place], [address] [place] [phone] Fax: [phone]    [address], [place], [place], [address] ([address] [place], [place], [place], [place], [place], [place], [place], [place]) [facility] [email or web address] Accepts State funding/Medicaid Transitional housing, Intensive Outpatient Treatment, Outpatient treatment, Veterans Services  Phone: [phone] Fax: [phone] Address: [address], [place] [address]   [employer] of Care ([email or web address]/) Insurance: Medicaid Case Management, Administrator, arts, Medication Management, Outpatient Therapy, Psychosocial Rehabilitation, Substance Abuse Intensive Outpatient  Phone: [phone] Fax: [phone] [address], [place], [place],  [address]  [facility] Medicaid, most private insurance providers Types of Program: Individual/Group Therapy, Substance Abuse Treatment  Phone: [place] [phone] Fax: [phone] [address], [place], [place], [address] [place] [phone] [address] [place], [place], [address]  [facility]  Medicaid Types of Program: SAIOP  Phone: [phone] Fax: [phone] [address] [place], [place], [place], [address] [facility] Medicaid/state funds Crisis line [phone] [facility] [phone] [place] [phone] Eva [place] [phone]  [facility] [address];  [place], [place] [address] [phone]  Substance Abuse Intensive Outpatient Program [facility] [address] [place], [place] [address] [phone]- Substance abuse treatment  Successful Transitions  Insurance: [facility], [address], sliding scale Types of  Program: substance abuse treatment, transportation assistance Phone: [phone] Fax: [phone] Address: [address], [place] [address] [facility] ([email or web address]) Insurance: UHC, [place], [place], [place] Program: addiction counseling, detoxification,  Phone: [phone]  Fax: [phone] Address: [address] [place], [place] [address]  [facility] (statewide facilities/programs) [address] (Medicaid/state funds) [place], [place] [address]                      [email or web address]/ [phone] Daniel mcalpine- [phone] [place]- [phone] [facility] [place] (2 Locations) (Medicaid/state funds) --[address]  walk in 8:30-12 and 1-2:30 [place], [id number]   [facility]- [phone] --[address] [place], [place] [address]  [phone] walk in 8:30-12 and 2-3:30  [facility] state funds/medicaid [address] [place], [place] [address] [phone] [facility] (Suboxone clinic) Medicaid/state funds  [address]  [place], [place] [address] [phone]   [facility]  [address] [place], [place] [address]  [phone] (24 hours) [place]- [address] [place], [place] [address]  [phone] (24 hours) [place]- [address] [place] [phone] [place]- [address] [place] [phone] [place] [address] [place] [phone] [facility]- Medicaid and state funds  [place]- [address] [place], [place] [address] [phone] (24 hours) [place]- [address] [place], [place] [address] [phone] [facility]- [address] [place], [place] [address] [phone] (24 hours) [place] [address] [place], [place]  [address] [phone] [place]- [address] [facility] [address] [place] [phone]           Outpatient Substance Use Treatment Services       [facility]    Chemical Dependence Intensive Outpatient Program   [address]   [place], [place] [address]    [phone]   Private insurance, Medicare A&B, and [facility]       ADS (Alcohol  and Drug Services)    [address]    [place], [place] [address]   [phone]   Medicaid, Self Pay       [facility]        [address]    [place], [place]   [phone]   Medicaid and [facility], Self Pay       [facility]   [address]    [place], [place]    [phone]   [facility], and Self Pay      [facility] [place]        [address]      [place], [place] [address]    [phone] or [phone]   Private Insurance Only                                                 [facility]   [address]    [place],   [phone]   [phone]   Medicaid, Medicare, Private Insurance      [facility] Counseling  Services at the [facility]   [address]    [place], [place] [address]   [phone]   Services are free or reduced      [facility]    [address]   [phone]  Self Pay only, sliding scale      [facility]    [address]    [place], [place] [address]   [phone]   (Open Door ministry)   Self Pay, Medicaid Only       [facility]   [address], [place] [address]   [phone]   Medicaid, Medicare, Private Insurance                                                               Adolescent Substance Use Treatment Services          [facility]   [address]    [place], [place]    [phone]   Self Pay   Offer scholarships from the [facility] to help pay for treatment    Website: [email or web address]      [facility]   Adolescent Substance use Program   Males ages: 91-17   Adolescent Substance use Program   Females: 12-17      [facility]   [address]  [place], [place] [address]   (ph) [phone]  (fax) [phone]      [facility]    [address]  [place], [place] [address]   (ph) [phone]  (fax) [phone]      [facility]   [address]  [place], [place] [address]   (ph) [phone]  (fax) [phone]      [facility]   [address], [place], [place] [address]   (ph) [phone]   (fax) [phone]      Website: [email or web address]                           [facility]   Substance Abuse Intensive Outpatient Program for Adolescents   Phone: [phone]   Address: [address], [place], [place]   Website: [email or web address]/          Residential Substance Use Armed forces logistics/support/administrative officer ([facility]    [address]    [place], [place] [address]    [phone] or [phone]   Detox (Medicare, Medicaid, private insurance, and self pay)    Residential Rehab 14 days (Medicare, Medicaid, private insurance, and self pay)       [facility] (Residential Treatment Services)    [address] [place], [place]    [phone]    Female and Female Detox (Self Pay and Medicaid limited availability)    Rehab only Female (Medicaid and self pay only)       [facility] [address]        [address]    [place], [place] [address]    [phone] or [phone]   Detox and Residential Treatment   Private Insurance Only       [facility]    [address]    [place], [place] [address]    [phone]    Treatment Only, must make assessment appointment, and must be sober for assessment appointment.    Self Pay Only, Medicare A&B, [facility] [place] ID only!   *  Transportation assistance offered from [place] on [employer]       [address]   [place], [place] [address]   Walk in interviews M-Sat 8-4p   No pending legal charges   [phone]               ADATC:  [facility]   Referral    [address]   [place], [place]   [phone]   (Self Pay, [facility])      [facility]   [address]   [place], [place] [address]   [phone]   Detox and Residential Treatment   Medicare and Private Insurance      [place]   [address]    [place], [place] [address]   28 Day Women's Facility: [phone]   28 Day Men's Facility: [phone]   Long-term Residential Program:    [phone] Males 25 and Over   (No Insurance, upfront fee)      [facility]    [address]   [place], [place] [address]   [phone]   Private Insurance with Cigna, Private Pay     [facility]   [address]  [place], [place] [address]  Local?[phone]   Private Insurance Only      [facility]   [address]    [place], [place] [address]    [phone]   (Males, upfront fee)      [facility]   [address]    [place], [address]   [phone]   Private Insurance                [facility] Locations      [facility]    [address]    [place], [place]    [phone]   [place] Based Program for individuals experiencing homelessness   Self Pay, No insurance      [facility]    Men's program: [facility]   [address] [place], [place] [address]   [phone]      [facility]   Women's program: [facility]   [address]  [place], [place] [address]   [phone]   Christian Based Program for individuals experiencing homelessness   Self Pay, No insurance      [facility]   [address] [place], [place] [address]    [phone]   [place] Based Program for individuals experiencing homelessness   Self Pay, No insurance      [facility]   [address] [place], [place] [address]   [phone]   [place] Based Program for individuals experiencing homelessness   Self Pay, No insurance      [facility]   [address] [place], [place]   [phone]   [place] Based Program for males experiencing homelessness   Self Pay, No insurance   Mental Health Resources 24 Hour Mental Health Crisis Services *[facility] - walk in urgent care 24/7 for all insurances/uninsured - ([address] [place], [place], [place] [address]) [phone] *[facility] Mobile Crisis [phone] *[place]  National Suicide Hotline [phone])045-5244 *Family Services of the [employer] - domestic violence, rape,  victim assistance [phone] *[facility] [employer] [phone] 8-5pm or [phone] after hours  Other Mental Health (Therapy/Counseling/Psychiatry) Services -[facility] ([address] [place], [place] [address]) [phone] - Medicaid, uninsured   -[facility] [phone], [place], [place] [phone] - Medicare   -[facility] ([address], [place], [place] [address]) [phone]  -[facility] ([address], [place], [place] [address]) [phone] - Medicare  -[facility] ([address], [place], [place] [phone] - Medicaid  -[facility] [phone] [address]  [place], [place], [place] [phone] - Medicaid, sliding scale   -[facility] ([address] [place], [place] [phone] - Medicaid   -[facility] ([address] [place], [place], [place] [phone]   -[facility] [phone], [place], [place]) [phone] - uninsured and underinsured patients   -[facility] ([address], [place], [place] [address]) [phone] - Medicare and Medicaid   -Psychotherapeutic Services/ [facility]T Services ([address] [place], [place] [phone] - Medicaid   -[facility] ([place] and [place] locations) [phone] - Medicare and Medicaid   -Ima Anon, MD ([address] [place], [place] [address]) [phone]  [facility] ([address] [place], [place] [address]) [phone] - subsidized costs available  -[facility] ([address], [place], [place] [phone]  -[facility] ([address], [place], [place] [address]) [phone]  [facility] HOLLIS FORBES [address] [place], [place], [place] [address]) [phone]  -[facility] ([address] [place]na [address], [place], [place] [address]) [phone]  [facility] ([address] [place], [place], [place] [phone]  -[facility] Counseling ([address] [place], [place], [place]) [phone]  -[facility] ([address] [place], [place], [place] [address]) [phone]  -[address] [place], [place] [address]) [phone]  -[facility] ([address] [place], [place], [place] [phone] - Medicaid and Medicare, payment plans available  -[facility] ([address], [place], [place]) [phone]  -[address] Counseling ([address], [place], [place] [phone] -   -[facility] - Social Emotional Learning ([address], [place], [place] [phone] - Medicaid  -[facility] ([address] [place], [place], [place] [phone] - Medicaid  -[facility] ([address] [place], [place] [phone] - Medicare  -[facility] ([address] [place], 3rd Floor, [place], [place] [phone]- Medicaid, income based sliding scale   -[facility] [phone] [place], [place], [place] [address]) [phone] - Medicaid  -[facility] [place] [facility] ([address], [place], [place]) [phone]   [facility] on Mental Illness - [facility] [phone]; [place] [phone]

## 2024-06-15 NOTE — TOC Progression Note (Signed)
 Transition of Care [facility]) - Progression Note    Patient Details  Name: [name] MRN: [id number] Date of Birth: [birth year]  Transition of [facility]) CM/SW Contact  Lauraine FORBES Saa, LCSWA Phone Number: 06/15/2024, 9:52 AM  Clinical Narrative:     9:52 AM CSW introduced self and role to patient. Per patient requested, CSW provided SDOH, substance use, and mental health resources. Patient stated that she is to return to motel via friend and requested bus passes. CSW informed patient that bus passes could be requested in discharge lounge. Patient expressed understanding of the information.  Expected Discharge Plan: OP Rehab Barriers to Discharge: Barriers Resolved               Expected Discharge Plan and Services In-house Referral: Clinical Social Work     Living arrangements for the past 2 months: Hotel/Motel Expected Discharge Date: 06/15/24                                     Social Drivers of Health (SDOH) Interventions SDOH Screenings   Food Insecurity: Food Insecurity Present (06/14/2024)  Housing: High Risk (06/14/2024)  Transportation Needs: Unmet Transportation Needs (06/14/2024)  Utilities: At Risk (06/14/2024)  Alcohol  Screen: High Risk (06/14/2024)  Depression (PHQ2-9): Medium Risk (05/10/2021)  Financial Resource Strain: High Risk (06/14/2024)  Physical Activity: Inactive (06/14/2024)  Social Connections: Patient Unable To Answer (01/12/2024)  Stress: No Stress Concern Present (09/27/2018)  Tobacco Use: High Risk (06/14/2024)    Readmission Risk Interventions     No data to display

## 2024-06-15 NOTE — Plan of Care (Signed)

## 2024-06-15 NOTE — Progress Notes (Signed)
 Physical Therapy Treatment Patient Details Name: [name] MRN: [id number] DOB: 11/17/1955 Today's Date: 06/15/2024   History of Present Illness 37 yoF w/ PMH presented 06/11/24 with AMS.  Witnessed multiple seizures.  Pt intubated 8/24-8/25 for airway protection and O2 desaturations. Continuous EEG placed; PMH: Seizure disorder, ETOH withdrawal, pancreatitis, anxiety, depression and SAH.    PT Comments  The pt is making continued good functional progress, now performing transfers and ambulating mod I. She is able to ambulate in the room without AD but utilizes her [facility] in the hallways. She did express desire for [facility] assistance with resources for emotional/behavioral assistance and alcohol  cessation assistance. Notified TOC. She also requested follow-up PT for chronic L shoulder and neck pain. Educated pt on A/AAROM exercises for neck and shoulder to her tolerance. Updated d/c recs to OPPT per pt request.     If plan is discharge home, recommend the following: Assistance with cooking/housework;Assist for transportation;Help with stairs or ramp for entrance   Can travel by private vehicle        Equipment Recommendations  None recommended by PT    Recommendations for Other Services       Precautions / Restrictions Precautions Precautions: Fall;Other (comment) Precaution/Restrictions Comments: seizure Restrictions Weight Bearing Restrictions Per Provider Order: No     Mobility  Bed Mobility               General bed mobility comments: Pt in bathroom upon arrival, sitting in chair end of session.    Transfers Overall transfer level: Modified independent Equipment used: None Transfers: Sit to/from Stand Sit to Stand: Modified independent (Device/Increase time)           General transfer comment: Pt standing from toilet without assistance or LOB    Ambulation/Gait Ambulation/Gait assistance: Modified independent (Device/Increase time) Gait Distance  (Feet): 440 Feet Assistive device: None, Straight cane Gait Pattern/deviations: WFL(Within Functional Limits) Gait velocity: reduced Gait velocity interpretation: >2.62 ft/sec, indicative of community ambulatory   General Gait Details: Pt ambulates slowly but fairly steadily in room without UE support and in hallway with [facility] support. No LOB.   Stairs             Wheelchair Mobility     Tilt Bed    Modified Rankin (Stroke Patients Only)       Balance Overall balance assessment: Needs assistance Sitting-balance support: No upper extremity supported, Feet supported Sitting balance-Leahy Scale: Good     Standing balance support: No upper extremity supported, Single extremity supported, During functional activity Standing balance-Leahy Scale: Good                              Communication Communication Communication: No apparent difficulties  Cognition Arousal: Alert Behavior During Therapy: WFL for tasks assessed/performed   PT - Cognitive impairments: No apparent impairments                       PT - Cognition Comments: Pt sometimes vague with answers or difficult to follow track of thought, but may be baseline Following commands: Intact      Cueing Cueing Techniques: Verbal cues, Gestural cues, Tactile cues  Exercises      General Comments General comments (skin integrity, edema, etc.): Encouraged A/AAROM of neck and L shoulder to tolerance.      Pertinent Vitals/Pain Pain Assessment Pain Assessment: Faces Faces Pain Scale: Hurts a little bit Pain Location: chronic L  shoulder and neck Pain Descriptors / Indicators: Sore, Discomfort Pain Intervention(s): Limited activity within patient's tolerance, Monitored during session    Home Living                          Prior Function            PT Goals (current goals can now be found in the care plan section) Acute Rehab PT Goals Patient Stated Goal: return home with  fiance PT Goal Formulation: With patient Time For Goal Achievement: 06/26/24 Potential to Achieve Goals: Good Progress towards PT goals: Progressing toward goals    Frequency    Min 2X/week      PT Plan      Co-evaluation              AM-PAC PT 6 Clicks Mobility   Outcome Measure  Help needed turning from your back to your side while in a flat bed without using bedrails?: None Help needed moving from lying on your back to sitting on the side of a flat bed without using bedrails?: None Help needed moving to and from a bed to a chair (including a wheelchair)?: None Help needed standing up from a chair using your arms (e.g., wheelchair or bedside chair)?: None Help needed to walk in hospital room?: None Help needed climbing 3-5 steps with a railing? : A Little 6 Click Score: 23    End of Session   Activity Tolerance: Patient tolerated treatment well Patient left: in chair (pt mobilizing in room independently upon arrival and able to ambulate to call button if needed) Nurse Communication: Other (comment) (TOC - pt's updated d/c needs and resources request) PT Visit Diagnosis: Unsteadiness on feet (R26.81);Other symptoms and signs involving the nervous system (R29.898)     Time: 9078-9062 PT Time Calculation (min) (ACUTE ONLY): 16 min  Charges:    $Therapeutic Activity: 8-22 mins PT General Charges $$ ACUTE PT VISIT: 1 Visit                     Theo Ferretti, PT, DPT Acute Rehabilitation Services  Office: [phone]    Theo CHRISTELLA Ferretti 06/15/2024, 9:46 AM

## 2024-06-15 NOTE — Discharge Summary (Signed)
 Physician Discharge Summary  [name] FMW:[id number] DOB: 22-Aug-1956 DOA: 06/11/2024  PCP: Lenon Nell SAILOR, FNP  Admit date: 06/11/2024 Discharge date: 06/15/2024  Admitted From: Home Home Disposition: Home  Recommendations for Outpatient Follow-up:  Follow up with PCP in 1-2 weeks Please obtain BMP/CBC in one week your next doctors visit.  Antiepileptic regimen changed to Keppra  and Onfi  at bedtime along with Valtoco  for breakthrough seizure [facility] neurolog[facility] referral has been given. Advised to be compliant with her medications   Discharge Condition: Stable CODE STATUS: Full code Diet recommendation: Regular  Brief/Interim Summary: Brief Narrative:  68 year old with history of seizure, medication noncompliance, alcohol  use, depression comes to the ED for change in mental status.  EMS witnessed multiple seizures.  Patient was admitted to the ICU intubated.  EEG did not demonstrate any further seizures therefore was discontinued.  Seen by neurologist recommended Keppra  and Vimpat .  Patient was eventually extubated on 8/25.  Started on 7 days of doxycycline  due to concerns of community-acquired pneumonia. Patient was eventually transitioned to Keppra  and Onfi  at bedtime.  Valtoco  was prescribed for breakthrough seizures as well.  Doing significantly well today.  Will discharge in stable condition  Assessment & Plan:  Principal Problem:   Convulsions, status epilepticus (HCC)    Status epilepticus with prior history of seizure disorder, likely due to noncompliance with medications Seen by neurology team, no further episodes of seizures noted.  This could have been likely due to medication noncompliance or alcohol  use.  Current regimen adjusted.  Now on Keppra  and Onfi  at bedtime.  Valtoco  given as needed for breakthrough seizures.   Acute respiratory failure with hypoxia: in the setting of status epilepticus Concern for CAP Currently on p.o. doxycycline .  Complete total  7 days of this.   Alcohol  abuse disorder Monitor for any withdrawal.    Major depressive disorder Resume home gabapentin    Right upper arm pain:  X-ray of the right shoulder shows remote injury but no acute pathology  Hypokalemia/hypomagnesemia/hypophosphatemia - As needed repletion  Speech and swallow evaluation-on regular diet  DVT prophylaxis: Lovenox     Code Status: Full Code Family Communication:   Status is: Inpatient Remains inpatient appropriate because: discharge  PT Follow up Recs: Other (Comment) (Prior Status And Home Set-Up Unknown As Pt Is Confused And Not Reliable)06/12/2024 1411  Subjective: Seen at bedside no complaints.  Examination:  General exam: Appears calm and comfortable  Respiratory system: Clear to auscultation. Respiratory effort normal. Cardiovascular system: S1 & S2 heard, RRR. No JVD, murmurs, rubs, gallops or clicks. No pedal edema. Gastrointestinal system: Abdomen is nondistended, soft and nontender. No organomegaly or masses felt. Normal bowel sounds heard. Central nervous system: Alert and oriented. No focal neurological deficits. Extremities: Symmetric 5 x 5 power. Skin: No rashes, lesions or ulcers Psychiatry: Judgement and insight appear normal. Mood & affect appropriate.    Discharge Diagnoses:  Principal Problem:   Convulsions, status epilepticus [facility])      Discharge Exam: Vitals:   06/14/24 2126 06/15/24 0254  BP: 134/84 132/87  Pulse: 97 (!) 102  Resp: 16 20  Temp: 99.3 F (37.4 C) 97.9 F (36.6 C)  SpO2: 98% 96%   Vitals:   06/14/24 1600 06/14/24 1623 06/14/24 2126 06/15/24 0254  BP: (!) 135/102 111/75 134/84 132/87  Pulse: 96 88 97 (!) 102  Resp: 12 20 16 20   Temp:  98.4 F (36.9 C) 99.3 F (37.4 C) 97.9 F (36.6 C)  TempSrc:  Oral Oral Oral  SpO2:  94% 98% 98% 96%  Weight:      Height:          Discharge Instructions  Discharge Instructions     Ambulatory referral to Neurology   Complete by: As  directed    An appointment is requested in approximately: 8-12 weeks   Ambulatory referral to Physical Therapy   Complete by: As directed    Iontophoresis - 4 mg/ml of dexamethasone: No   T.E.N.S. Unit Evaluation and Dispense as Indicated: No      Allergies as of 06/15/2024       Reactions   Keflex  [cephalexin ] Hives   Cephalosporins Hives   Prednisone  Swelling   Toradol  [ketorolac  Tromethamine ] Hives   Ultram  [tramadol ] Hives        Medication List     STOP taking these medications    lacosamide  50 MG Tabs tablet Commonly known as: VIMPAT    levETIRAcetam  500 MG tablet Commonly known as: KEPPRA  Replaced by: levETIRAcetam  500 MG 24 hr tablet       TAKE these medications    acetaminophen  500 MG tablet Commonly known as: TYLENOL  Take 1 tablet (500 mg total) by mouth every 6 (six) hours as needed for moderate pain (pain score 4-6).   cloBAZam  10 MG tablet Commonly known as: ONFI  Take 0.5 tablets (5 mg total) by mouth at bedtime.   cyanocobalamin  1000 MCG tablet Take 1 tablet (1,000 mcg total) by mouth daily.   diazePAM  (15 MG Dose) 2 x 7.5 MG/0.1ML Lqpk Place 15 mg into the nose as needed.   doxycycline  100 MG tablet Commonly known as: VIBRA -TABS Take 1 tablet (100 mg total) by mouth every 12 (twelve) hours for 5 days.   DULoxetine  30 MG capsule Commonly known as: CYMBALTA  Take 1 capsule (30 mg total) by mouth daily.   erythromycin  ophthalmic ointment Place a 1/2 inch ribbon of ointment into the lower eyelid.   folic acid  1 MG tablet Commonly known as: FOLVITE  Take 1 tablet (1 mg total) by mouth daily.   gabapentin  300 MG capsule Commonly known as: Neurontin  Take 1 capsule (300 mg total) by mouth 3 (three) times daily.   levETIRAcetam  500 MG 24 hr tablet Commonly known as: KEPPRA  XR Take 2 tablets (1,000 mg total) by mouth at bedtime. Replaces: levETIRAcetam  500 MG tablet   menthol -cetylpyridinium 3 MG lozenge Commonly known as: CEPACOL Take 1  lozenge (3 mg total) by mouth as needed for sore throat.   methocarbamol  500 MG tablet Commonly known as: ROBAXIN  Take 1 tablet (500 mg total) by mouth 2 (two) times daily.   pantoprazole  40 MG tablet Commonly known as: Protonix  Take 1 tablet (40 mg total) by mouth daily.   polyethylene glycol powder 17 GM/SCOOP powder Commonly known as: GLYCOLAX /MIRALAX  Take 17 g by mouth daily as needed for moderate constipation.   risperiDONE  0.5 MG tablet Commonly known as: RISPERDAL  Take 1 tablet (0.5 mg total) by mouth 2 (two) times daily.   thiamine  100 MG tablet Commonly known as: VITAMIN B1 Take 1 tablet (100 mg total) by mouth daily.        Follow-up Information     Lenon Nell SAILOR, FNP Follow up in 1 week(s).   Specialty: Nurse Practitioner Contact information: [address] [place] [address] [phone]         [facility] Follow up.   Specialty: Rehabilitation Why: Please call and follow up regarding Outpatient therapy needs.  You can schedule  transportation through [facility] transportation 48 hours in advance to scheduled appointment. Contact information: [address] [place]  [phone]               Allergies  Allergen Reactions   Keflex  [Cephalexin ] Hives   Cephalosporins Hives   Prednisone  Swelling   Toradol  [Ketorolac  Tromethamine ] Hives   Ultram  [Tramadol ] Hives    You were cared for by a hospitalist during your hospital stay. If you have any questions about your discharge medications or the care you received while you were in the hospital after you are discharged, you can call the unit and asked to speak with the hospitalist on call if the hospitalist that took care of you is not available. Once you are discharged, your primary care physician will handle any further medical issues. Please note that no refills for any discharge medications will  be authorized once you are discharged, as it is imperative that you return to your primary care physician (or establish a relationship with a primary care physician if you do not have one) for your aftercare needs so that they can reassess your need for medications and monitor your lab values.  You were cared for by a hospitalist during your hospital stay. If you have any questions about your discharge medications or the care you received while you were in the hospital after you are discharged, you can call the unit and asked to speak with the hospitalist on call if the hospitalist that took care of you is not available. Once you are discharged, your primary care physician will handle any further medical issues. Please note that NO REFILLS for any discharge medications will be authorized once you are discharged, as it is imperative that you return to your primary care physician (or establish a relationship with a primary care physician if you do not have one) for your aftercare needs so that they can reassess your need for medications and monitor your lab values.  Please request your Prim.MD to go over all Hospital Tests and Procedure/Radiological results at the follow up, please get all Hospital records sent to your Prim MD by signing hospital release before you go home.  Get CBC, CMP, 2 view Chest X ray checked  by Primary MD during your next visit or SNF MD in 5-7 days ( we routinely change or add medications that can affect your baseline labs and fluid status, therefore we recommend that you get the mentioned basic workup next visit with your PCP, your PCP may decide not to get them or add new tests based on their clinical decision)  On your next visit with your primary care physician please Get Medicines reviewed and adjusted.  If you experience worsening of your admission symptoms, develop shortness of breath, life threatening emergency, suicidal or homicidal thoughts you must seek medical attention  immediately by calling 911 or calling your MD immediately  if symptoms less severe.  You Must read complete instructions/literature along with all the possible adverse reactions/side effects for all the Medicines you take and that have been prescribed to you. Take any new Medicines after you have completely understood and accpet all the possible adverse reactions/side effects.   Do not drive, operate heavy machinery, perform activities at heights, swimming or participation in water activities or provide baby sitting services if your were admitted for syncope or siezures until you have seen by Primary MD or a Neurologist and advised to do so again.  Do not drive when taking  Pain medications.   Procedures/Studies: DG Humerus Right Result Date: 06/13/2024 CLINICAL DATA:  Arm pain. EXAM: RIGHT HUMERUS - 2+ VIEW COMPARISON:  None Available. FINDINGS: There is no evidence of fracture. No erosive change. Deformity of the superolateral humeral head is suggestive of remote injury. Elbow alignment is maintained. Shoulder alignment is not well assessed on provided view. Soft tissues are unremarkable. IMPRESSION: 1. No acute findings. 2. Deformity of the superolateral humeral head is suggestive of remote injury. Electronically Signed   By: Andrea Gasman M.D.   On: 06/13/2024 13:10   Overnight EEG with video Result Date: 06/12/2024 Shelton Arlin KIDD, MD     06/13/2024  9:22 AM Patient Name: [name] MRN: [id number] Epilepsy Attending: Arlin KIDD Shelton Referring Physician/Provider: Michaela Aisha SQUIBB, MD Duration: 06/11/2024 0932 to 06/12/2024 0932 Patient history: 68 year old female with seizure disorder, noncompliance with medications and alcohol  abuse was brought into the emergency department with altered mental status.  EEG to evaluate for seizure Level of alertness: Lethargic, asleep AEDs during EEG study: Propofol , valium , LEV Technical aspects: This EEG study was done with scalp electrodes  positioned according to the 10-20 International system of electrode placement. Electrical activity was reviewed with band pass filter of 1-70Hz , sensitivity of 7 uV/mm, display speed of 51mm/sec with a 60Hz  notched filter applied as appropriate. EEG data were recorded continuously and digitally stored.  Video monitoring was available and reviewed as appropriate. Description: EEG initially showed posterior dominant rhythm of 8 Hz activity of moderate voltage (25-35 uV) seen predominantly in posterior head regions, symmetric and reactive to eye opening and eye closing. Sleep was characterized by vertex waves, sleep spindles (12 to 14 Hz), maximal frontocentral region. As sedation was increased, EEG showed continuous generalized 3 to 6 Hz theta-delta slowing admixed with 13-15hz  beta activity. Hyperventilation and photic stimulation were not performed.   ABNORMALITY - Continuous slow, generalized IMPRESSION: This study was suggestive of severe diffuse encephalopathy, likely related to sedation. No seizures or epileptiform discharges were seen throughout the recording. Arlin KIDD Shelton   DG Abd 1 View Result Date: 06/11/2024 CLINICAL DATA:  OG tube placement. EXAM: ABDOMEN - 1 VIEW COMPARISON:  None Available. FINDINGS: Endotracheal tube tip is about 9 mm above the carina. NG tube tip is in the stomach with proximal side port well below the GE junction. Moderate gaseous distention of the stomach evident. Asymmetric elevation right hemidiaphragm. IMPRESSION: 1. Endotracheal tube tip is about 9 mm above the carina. The tip projects lower on this study than on the x-ray from about 2 hours ago, potentially due to positioning. Repeat chest x-ray could be formed to reassess as clinically warranted. 2. NG tube tip is in the stomach. These results will be called to the ordering clinician or representative by the Radiologist Assistant, and communication documented in the PACS or [employer]. Electronically Signed   By:  Camellia Candle M.D.   On: 06/11/2024 11:37   DG Chest Portable 1 View Result Date: 06/11/2024 CLINICAL DATA:  Unresponsive.  Endotracheal tube present. EXAM: PORTABLE CHEST 1 VIEW COMPARISON:  01/13/2024 FINDINGS: Endotracheal tube remains in appropriate position. Improved aeration is seen in the lung bases with decreased atelectasis. No new or worsening areas of pulmonary opacity are seen. Normal heart size. IMPRESSION: Improved aeration in both lung bases with decreased atelectasis. Electronically Signed   By: Norleen DELENA Kil M.D.   On: 06/11/2024 09:41   CT Head Wo Contrast Result Date: 06/11/2024 CLINICAL DATA:  Seizure.  Altered mental status.  EXAM: CT HEAD WITHOUT CONTRAST TECHNIQUE: Contiguous axial images were obtained from the base of the skull through the vertex without intravenous contrast. RADIATION DOSE REDUCTION: This exam was performed according to the departmental dose-optimization program which includes automated exposure control, adjustment of the mA and/or kV according to patient size and/or use of iterative reconstruction technique. COMPARISON:  04/01/2024 FINDINGS: Brain: No evidence of intracranial hemorrhage, acute infarction, hydrocephalus, extra-axial collection, or mass lesion/mass effect. Mild diffuse cerebral atrophy and chronic small vessel disease, without significant change. Vascular:  No hyperdense vessel or other acute findings. Skull: No evidence of fracture or other significant bone abnormality. Sinuses/Orbits:  No acute findings. Other: None. IMPRESSION: No acute intracranial abnormality. Mild cerebral atrophy and chronic small vessel disease. Electronically Signed   By: Norleen DELENA Kil M.D.   On: 06/11/2024 09:38     The results of significant diagnostics from this hospitalization (including imaging, microbiology, ancillary and laboratory) are listed below for reference.     Microbiology: Recent Results (from the past 240 hours)  MRSA Next Gen by PCR, Nasal     Status:  Abnormal   Collection Time: 06/11/24  9:21 AM   Specimen: Nasal Mucosa; Nasal Swab  Result Value Ref Range Status   MRSA by PCR Next Gen DETECTED (A) NOT DETECTED Final    Comment: RESULT CALLED TO, READ BACK BY AND VERIFIED WITH: RN MAGGIE C ON 06/12/24 @ 1434 BY DRT (NOTE) The GeneXpert MRSA Assay (FDA approved for NASAL specimens only), is one component of a comprehensive MRSA colonization surveillance program. It is not intended to diagnose MRSA infection nor to guide or monitor treatment for MRSA infections. Test performance is not FDA approved in patients less than 94 years old. Performed at [facility], [address], [place], [place] [address]   Expectorated Sputum Assessment w Gram Stain, Rflx to Resp Cult     Status: None   Collection Time: 06/12/24  8:52 AM   Specimen: Tracheal Aspirate; Sputum  Result Value Ref Range Status   Specimen Description TRACHEAL ASPIRATE  Final   Special Requests NONE  Final   Sputum evaluation   Final    THIS SPECIMEN IS ACCEPTABLE FOR SPUTUM CULTURE Performed at [facility], [address] [place], [place] [address]    Report Status 06/12/2024 FINAL  Final  Culture, Respiratory w Gram Stain     Status: None   Collection Time: 06/12/24  8:52 AM   Specimen: Tracheal Aspirate  Result Value Ref Range Status   Specimen Description TRACHEAL ASPIRATE  Final   Special Requests NONE Reflexed from [id number]  Final   Gram Stain   Final    ABUNDANT WBC PRESENT, PREDOMINANTLY PMN ABUNDANT GRAM POSITIVE COCCI IN CLUSTERS Performed at [facility], [address] [place], [place] [address]    Culture   Final    ABUNDANT METHICILLIN RESISTANT STAPHYLOCOCCUS AUREUS   Report Status 06/14/2024 FINAL  Final   Organism ID, Bacteria METHICILLIN RESISTANT STAPHYLOCOCCUS AUREUS  Final      Susceptibility   Methicillin resistant staphylococcus aureus - MIC*    CIPROFLOXACIN  >=8 RESISTANT Resistant     ERYTHROMYCIN  >=8 RESISTANT Resistant      GENTAMICIN <=0.5 SENSITIVE Sensitive     OXACILLIN >=4 RESISTANT Resistant     TETRACYCLINE <=1 SENSITIVE Sensitive     VANCOMYCIN 1 SENSITIVE Sensitive     TRIMETH /SULFA  <=10 SENSITIVE Sensitive     CLINDAMYCIN <=0.25 SENSITIVE Sensitive  RIFAMPIN <=0.5 SENSITIVE Sensitive     Inducible Clindamycin NEGATIVE Sensitive     LINEZOLID 2 SENSITIVE Sensitive     * ABUNDANT METHICILLIN RESISTANT STAPHYLOCOCCUS AUREUS     Labs: BNP (last 3 results) No results for input(s): BNP in the last 8760 hours. Basic Metabolic Panel: Recent Labs  Lab 06/11/24 1211 06/12/24 0421 06/12/24 0429 06/13/24 0924 06/14/24 0811 06/15/24 0349  NA 139 139 138 140 139 138  K 2.4* 4.3 4.4 3.5 3.3* 3.5  CL 101 109  --  108 107 107  CO2 24 23  --  23 25 21*  GLUCOSE 125* 103*  --  91 77 93  BUN <5* 6*  --  7* <5* <5*  CREATININE 0.58 0.81  --  0.64 0.61 0.60  CALCIUM  9.2 8.3*  --  7.9* 8.0* 8.0*  MG  --  1.4*  --  1.9 1.6* 1.9  PHOS  --  2.1*  --  2.8 1.9* 2.4*   Liver Function Tests: Recent Labs  Lab 06/11/24 0921 06/13/24 0924 06/14/24 0811  AST 58* 176* 166*  ALT 27 45* 52*  ALKPHOS 86 65 67  BILITOT 1.2 1.2 1.2  PROT 6.9 5.3* 6.1*  ALBUMIN 3.9 2.4* 2.8*   No results for input(s): LIPASE, AMYLASE in the last 168 hours. Recent Labs  Lab 06/11/24 0833  AMMONIA 34   CBC: Recent Labs  Lab 06/11/24 0845 06/11/24 0912 06/11/24 1211 06/12/24 0421 06/12/24 0429 06/13/24 0924 06/14/24 0811 06/15/24 0958  WBC 20.2*  --  17.6* 11.7*  --  7.7 5.5 5.0  NEUTROABS 18.6*  --   --   --   --   --   --   --   HGB 14.1   < > 13.1 12.1 12.2 11.5* 13.1 11.8*  HCT 43.1   < > 38.7 36.8 36.0 35.1* 40.2 35.1*  MCV 107.8*  --  102.7* 107.3*  --  106.4* 106.3* 105.1*  PLT 278  --  266 246  --  239 318 343   < > = values in this interval not displayed.   Cardiac Enzymes: No results for input(s): CKTOTAL, CKMB, CKMBINDEX, TROPONINI in the last 168 hours. BNP: Invalid input(s):  POCBNP CBG: Recent Labs  Lab 06/13/24 2339 06/14/24 0342 06/14/24 0757 06/14/24 1152 06/14/24 2127  GLUCAP 81 76 71 127* 125*   D-Dimer No results for input(s): DDIMER in the last 72 hours. Hgb A1c No results for input(s): HGBA1C in the last 72 hours. Lipid Profile No results for input(s): CHOL, HDL, LDLCALC, TRIG, CHOLHDL, LDLDIRECT in the last 72 hours. Thyroid  function studies No results for input(s): TSH, T4TOTAL, T3FREE, THYROIDAB in the last 72 hours.  Invalid input(s): FREET3 Anemia work up Recent Labs    06/14/24 0811  VITAMINB12 1,006*   Urinalysis    Component Value Date/Time   COLORURINE STRAW (A) 10/22/2023 1752   APPEARANCEUR CLEAR 10/22/2023 1752   LABSPEC 1.002 (L) 10/22/2023 1752   PHURINE 6.0 10/22/2023 1752   GLUCOSEU NEGATIVE 10/22/2023 1752   HGBUR NEGATIVE 10/22/2023 1752   BILIRUBINUR NEGATIVE 10/22/2023 1752   KETONESUR NEGATIVE 10/22/2023 1752   PROTEINUR NEGATIVE 10/22/2023 1752   UROBILINOGEN 0.2 07/14/2019 1451   NITRITE NEGATIVE 10/22/2023 1752   LEUKOCYTESUR NEGATIVE 10/22/2023 1752   Sepsis Labs Recent Labs  Lab 06/12/24 0421 06/13/24 0924 06/14/24 0811 06/15/24 0958  WBC 11.7* 7.7 5.5 5.0   Microbiology Recent Results (from the past 240 hours)  MRSA Next Gen by  PCR, Nasal     Status: Abnormal   Collection Time: 06/11/24  9:21 AM   Specimen: Nasal Mucosa; Nasal Swab  Result Value Ref Range Status   MRSA by PCR Next Gen DETECTED (A) NOT DETECTED Final    Comment: RESULT CALLED TO, READ BACK BY AND VERIFIED WITH: RN MAGGIE C ON 06/12/24 @ 1434 BY DRT (NOTE) The GeneXpert MRSA Assay (FDA approved for NASAL specimens only), is one component of a comprehensive MRSA colonization surveillance program. It is not intended to diagnose MRSA infection nor to guide or monitor treatment for MRSA infections. Test performance is not FDA approved in patients less than 66 years old. Performed at [facility], [address], [place], [place] [address]   Expectorated Sputum Assessment w Gram Stain, Rflx to Resp Cult     Status: None   Collection Time: 06/12/24  8:52 AM   Specimen: Tracheal Aspirate; Sputum  Result Value Ref Range Status   Specimen Description TRACHEAL ASPIRATE  Final   Special Requests NONE  Final   Sputum evaluation   Final    THIS SPECIMEN IS ACCEPTABLE FOR SPUTUM CULTURE Performed at [facility], [address], [place], [place] [address]    Report Status 06/12/2024 FINAL  Final  Culture, Respiratory w Gram Stain     Status: None   Collection Time: 06/12/24  8:52 AM   Specimen: Tracheal Aspirate  Result Value Ref Range Status   Specimen Description TRACHEAL ASPIRATE  Final   Special Requests NONE Reflexed from [id number]  Final   Gram Stain   Final    ABUNDANT WBC PRESENT, PREDOMINANTLY PMN ABUNDANT GRAM POSITIVE COCCI IN CLUSTERS Performed at [facility], [address], [place], [place] [address]    Culture   Final    ABUNDANT METHICILLIN RESISTANT STAPHYLOCOCCUS AUREUS   Report Status 06/14/2024 FINAL  Final   Organism ID, Bacteria METHICILLIN RESISTANT STAPHYLOCOCCUS AUREUS  Final      Susceptibility   Methicillin resistant staphylococcus aureus - MIC*    CIPROFLOXACIN  >=8 RESISTANT Resistant     ERYTHROMYCIN  >=8 RESISTANT Resistant     GENTAMICIN <=0.5 SENSITIVE Sensitive     OXACILLIN >=4 RESISTANT Resistant     TETRACYCLINE <=1 SENSITIVE Sensitive     VANCOMYCIN 1 SENSITIVE Sensitive     TRIMETH /SULFA  <=10 SENSITIVE Sensitive     CLINDAMYCIN <=0.25 SENSITIVE Sensitive     RIFAMPIN <=0.5 SENSITIVE Sensitive     Inducible Clindamycin NEGATIVE Sensitive     LINEZOLID 2 SENSITIVE Sensitive     * ABUNDANT METHICILLIN RESISTANT STAPHYLOCOCCUS AUREUS     Time coordinating discharge:  I have spent 35 minutes face to face with the patient and on the ward discussing the patients care, assessment, plan and disposition with other care givers.  >50% of the time was devoted counseling the patient about the risks and benefits of treatment/Discharge disposition and coordinating care.   SIGNED:   Burgess JAYSON Dare, MD  [facility] 06/15/2024, 10:37 AM   If 7PM-7AM, please contact night-coverage

## 2024-06-22 ENCOUNTER — Other Ambulatory Visit (HOSPITAL_COMMUNITY): Payer: Self-pay

## 2024-06-22 LAB — LACOSAMIDE: Lacosamide: <0.5 ug/mL — ABNORMAL LOW (ref 5.0–10.0)

## 2024-07-11 ENCOUNTER — Encounter: Payer: Self-pay | Admitting: Neurology

## 2024-08-22 ENCOUNTER — Encounter (HOSPITAL_COMMUNITY): Payer: Self-pay | Admitting: Emergency Medicine

## 2024-08-22 ENCOUNTER — Emergency Department (HOSPITAL_COMMUNITY)

## 2024-08-22 ENCOUNTER — Emergency Department (HOSPITAL_COMMUNITY)
Admission: EM | Admit: 2024-08-22 | Discharge: 2024-08-22 | Disposition: A | Discharge status: Home / Self Care | Attending: Emergency Medicine | Admitting: Emergency Medicine

## 2024-08-22 ENCOUNTER — Other Ambulatory Visit: Payer: Self-pay

## 2024-08-22 DIAGNOSIS — F10920 Alcohol use, unspecified with intoxication, uncomplicated: Secondary | ICD-10-CM

## 2024-08-22 DIAGNOSIS — F10120 Alcohol abuse with intoxication, uncomplicated: Secondary | ICD-10-CM | POA: Diagnosis present

## 2024-08-22 DIAGNOSIS — R4182 Altered mental status, unspecified: Secondary | ICD-10-CM | POA: Insufficient documentation

## 2024-08-22 DIAGNOSIS — Y908 Blood alcohol level of 240 mg/100 ml or more: Secondary | ICD-10-CM | POA: Diagnosis not present

## 2024-08-22 DIAGNOSIS — F17210 Nicotine dependence, cigarettes, uncomplicated: Secondary | ICD-10-CM | POA: Diagnosis not present

## 2024-08-22 DIAGNOSIS — E876 Hypokalemia: Secondary | ICD-10-CM | POA: Diagnosis not present

## 2024-08-22 LAB — SALICYLATE LEVEL: Salicylate Lvl: <7 mg/dL — ABNORMAL LOW (ref 7.0–30.0)

## 2024-08-22 LAB — I-STAT VENOUS BLOOD GAS, ED
Acid-Base Excess: 0 mmol/L (ref 0.0–2.0)
Bicarbonate: 27.2 mmol/L (ref 20.0–28.0)
Calcium, Ion: 1.14 mmol/L — ABNORMAL LOW (ref 1.15–1.40)
HCT: 43 % (ref 36.0–46.0)
Hemoglobin: 14.6 g/dL (ref 12.0–15.0)
O2 Saturation: 48 %
Potassium: 3.1 mmol/L — ABNORMAL LOW (ref 3.5–5.1)
Sodium: 148 mmol/L — ABNORMAL HIGH (ref 135–145)
TCO2: 29 mmol/L (ref 22–32)
pCO2, Ven: 52 mmHg (ref 44–60)
pH, Ven: 7.326 (ref 7.25–7.43)
pO2, Ven: 28 mmHg — CL (ref 32–45)

## 2024-08-22 LAB — COMPREHENSIVE METABOLIC PANEL WITH GFR
ALT: 15 U/L (ref 0–44)
AST: 31 U/L (ref 15–41)
Albumin: 3.8 g/dL (ref 3.5–5.0)
Alkaline Phosphatase: 63 U/L (ref 38–126)
Anion gap: 11 (ref 5–15)
BUN: <5 mg/dL — ABNORMAL LOW (ref 8–23)
CO2: 27 mmol/L (ref 22–32)
Calcium: 9.1 mg/dL (ref 8.9–10.3)
Chloride: 109 mmol/L (ref 98–111)
Creatinine, Ser: 0.67 mg/dL (ref 0.44–1.00)
GFR, Estimated: >60 mL/min (ref >60)
Glucose, Bld: 90 mg/dL (ref 70–99)
Potassium: 3.1 mmol/L — ABNORMAL LOW (ref 3.5–5.1)
Sodium: 147 mmol/L — ABNORMAL HIGH (ref 135–145)
Total Bilirubin: 0.3 mg/dL (ref 0.0–1.2)
Total Protein: 6.8 g/dL (ref 6.5–8.1)

## 2024-08-22 LAB — ETHANOL: Alcohol, Ethyl (B): 279 mg/dL — ABNORMAL HIGH (ref <15)

## 2024-08-22 LAB — ACETAMINOPHEN LEVEL: Acetaminophen (Tylenol), Serum: <10 ug/mL — ABNORMAL LOW (ref 10–30)

## 2024-08-22 LAB — CBC WITH DIFFERENTIAL/PLATELET
Abs Immature Granulocytes: 0.02 K/uL (ref 0.00–0.07)
Basophils Absolute: 0.1 K/uL (ref 0.0–0.1)
Basophils Relative: 1 %
Eosinophils Absolute: 0.2 K/uL (ref 0.0–0.5)
Eosinophils Relative: 3 %
HCT: 43 % (ref 36.0–46.0)
Hemoglobin: 14.1 g/dL (ref 12.0–15.0)
Immature Granulocytes: 0 %
Lymphocytes Relative: 58 %
Lymphs Abs: 2.8 K/uL (ref 0.7–4.0)
MCH: 34.4 pg — ABNORMAL HIGH (ref 26.0–34.0)
MCHC: 32.8 g/dL (ref 30.0–36.0)
MCV: 104.9 fL — ABNORMAL HIGH (ref 80.0–100.0)
Monocytes Absolute: 0.3 K/uL (ref 0.1–1.0)
Monocytes Relative: 7 %
Neutro Abs: 1.5 K/uL — ABNORMAL LOW (ref 1.7–7.7)
Neutrophils Relative %: 31 %
Platelets: 547 K/uL — ABNORMAL HIGH (ref 150–400)
RBC: 4.1 MIL/uL (ref 3.87–5.11)
RDW: 15.2 % (ref 11.5–15.5)
WBC: 4.8 K/uL (ref 4.0–10.5)
nRBC: 0 % (ref 0.0–0.2)

## 2024-08-22 LAB — CK: Total CK: 46 U/L (ref 38–234)

## 2024-08-22 MED ORDER — LACTATED RINGERS IV BOLUS
1000.0000 mL | Freq: Once | INTRAVENOUS | Status: AC
  Administered 2024-08-22: 1000 mL via INTRAVENOUS

## 2024-08-22 MED ORDER — DIAZEPAM 5 MG PO TABS
5.0000 mg | ORAL_TABLET | Freq: Once | ORAL | Status: AC
  Administered 2024-08-22: 5 mg via ORAL
  Filled 2024-08-22: qty 1

## 2024-08-22 MED ORDER — LEVETIRACETAM (KEPPRA) 500 MG/5 ML ADULT IV PUSH
1000.0000 mg | Freq: Once | INTRAVENOUS | Status: AC
  Administered 2024-08-22: 1000 mg via INTRAVENOUS
  Filled 2024-08-22: qty 10

## 2024-08-22 MED ORDER — POTASSIUM CHLORIDE CRYS ER 20 MEQ PO TBCR
20.0000 meq | EXTENDED_RELEASE_TABLET | Freq: Two times a day (BID) | ORAL | 0 refills | Status: DC
  Filled 2024-08-22: qty 14, 7d supply, fill #0

## 2024-08-22 NOTE — Progress Notes (Signed)
 This writer walked into patients room to find patient at the foot of the bed, cussing, asking who stole her pocket book? The patient had no belongings when she was brought to room 11. This clinical research associate told Press Photographer, Ozell about patients request for her pocket book and he stated she was brought in by [facility] with no belongings. This writer found patients IV pulled out of her L AC in the floor. This writer accompanied patient back into her bed. Patient is currently resting. -------------------------------------------------------------------- Nat SQUIBB. Franchot, EMT

## 2024-08-22 NOTE — ED Notes (Signed)
 Pt refused DC VS. Pt given cab voucher to [facility]

## 2024-08-22 NOTE — ED Provider Notes (Signed)
 [place] EMERGENCY DEPARTMENT AT [facility] Provider Note  CSN: [id number] Arrival date & time: 08/22/24 1514  Chief Complaint(s) Altered Mental Status  HPI [name] is a 68 y.o. female who is here today after she was found down on the ground in a motel fused.  Patient reports alcohol  use.  She has a history of seizures, alcohol  withdrawal seizures.  Patient is unable to provide any meaningful history.   Past Medical History Past Medical History:  Diagnosis Date   Alcohol  dependence (HCC)    Anxiety    Chronic pain    Depression    Diverticulitis    Herniated cervical disc    Pancreatitis    Seizures (HCC)    alcoholic seizures   Patient Active Problem List   Diagnosis Date Noted   Convulsions, status epilepticus (HCC) 06/11/2024   Recurrent seizures (HCC) 08/18/2023   History of seizure 05/05/2023   GAD (generalized anxiety disorder) 05/05/2023   Seizure-like activity (HCC) 12/18/2022   Seizure (HCC) 12/18/2022   Hyperkalemia 10/19/2022   Acute metabolic encephalopathy 10/18/2022   Seizures (HCC) 10/17/2022   Chronic pain    Hypoalbuminemia    Hyperglycemia    Nonadherence to medication    Seizure (HCC) 05/17/2022   Status epilepticus (HCC)    Hypokalemia    Alcohol -induced mood disorder (HCC)    Closed displaced oblique fracture of shaft of left humerus 08/01/2020   GERD (gastroesophageal reflux disease) 02/28/2020   Alcohol  withdrawal related seizure (HCC) 12/13/2018   Thrombocytopenia 12/13/2018   Major depressive disorder 12/13/2018   Neuropathy 12/13/2018   Subarachnoid hemorrhage (HCC) 12/11/2018   Seizure due to alcohol  withdrawal (HCC) 12/11/2018   Alcohol  dependence with alcohol -induced mood disorder (HCC)    Major depressive disorder, 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 Authorizing Provider  potassium chloride  SA (KLOR-CON  M) 20 MEQ tablet Take 1 tablet (20 mEq total) by mouth 2 (two) times daily. 08/22/24  Yes Mannie Pac T, DO  acetaminophen  (TYLENOL ) 500 MG tablet Take 1 tablet (500 mg total) by mouth every 6 (six) hours as needed for moderate pain (pain score 4-6). 04/01/24   Charlyn Sora, MD  cloBAZam  (ONFI ) 10 MG tablet Take 0.5 tablets (5 mg total) by mouth at bedtime. 06/15/24   Amin, Ankit C, MD  cyanocobalamin  1000 MCG tablet Take 1 tablet (1,000 mcg total) by mouth daily. 09/28/23   Singh, Prashant K, MD  diazePAM , 15 MG Dose, 2 x 7.5 MG/0.1ML LQPK Place 15 mg into the nose as needed. 06/15/24   Amin, Ankit C, MD  DULoxetine  (CYMBALTA ) 30 MG capsule Take 1 capsule (30 mg total) by mouth daily. 01/20/24 02/19/24  Vernon Ranks, MD  erythromycin  ophthalmic ointment Place a 1/2 inch ribbon of ointment into the lower eyelid. 04/01/24   Charlyn Sora, MD  folic acid  (FOLVITE ) 1 MG tablet Take 1 tablet (1 mg total) by mouth daily. 09/28/23   Singh, Prashant K, MD  gabapentin  (NEURONTIN ) 300 MG capsule Take 1 capsule (300 mg total) by mouth 3 (three) times daily. 10/23/23   Haviland, Julie, MD  levETIRAcetam  (KEPPRA  XR) 500 MG 24 hr tablet Take 2 tablets (1,000 mg total) by mouth at bedtime. 06/15/24 07/15/24  Amin, Sora BROCKS, MD  menthol -cetylpyridinium (CEPACOL) 3 MG lozenge Take 1 lozenge (3 mg total) by mouth as needed for  sore throat. 09/28/23   Singh, Prashant K, MD  methocarbamol  (ROBAXIN ) 500 MG tablet Take 1 tablet (500 mg total) by mouth 2 (two) times daily. 10/23/23   Haviland, Julie, MD  pantoprazole  (PROTONIX ) 40 MG tablet Take 1 tablet (40 mg total) by mouth daily. 01/20/24 03/20/24  Pahwani, Ravi, MD  polyethylene glycol powder (GLYCOLAX /MIRALAX ) 17 GM/SCOOP powder Take 17 g by mouth daily as needed for moderate constipation. 06/15/24   Amin, Ankit C, MD  risperiDONE  (RISPERDAL ) 0.5 MG tablet Take 1 tablet (0.5 mg total) by mouth 2  (two) times daily. 01/20/24 02/19/24  Vernon Ranks, MD  thiamine  (VITAMIN B1) 100 MG tablet Take 1 tablet (100 mg total) by mouth daily. 09/28/23   Singh, Prashant K, MD  metoCLOPramide  (REGLAN ) 10 MG tablet Take 1 tablet (10 mg total) by mouth every 6 (six) hours as needed for nausea (nausea/headache). 04/06/19 05/24/19  Mesner, Selinda, MD                                                                                                                                    Past Surgical History Past Surgical History:  Procedure Laterality Date   ABDOMINAL HYSTERECTOMY     ANKLE SURGERY Right    CARPAL TUNNEL RELEASE Bilateral    CERVICAL FUSION     CHOLECYSTECTOMY     KNEE SURGERY     Family History Family History  Family history unknown: Yes    Social History Social History   Tobacco Use   Smoking status: Every Day    Current packs/day: 0.25    Average packs/day: 0.3 packs/day for 7.0 years (1.8 ttl pk-yrs)    Types: Cigarettes    Passive exposure: Current   Smokeless tobacco: Never  Vaping Use   Vaping status: Never Used  Substance Use Topics   Alcohol  use: Yes    Comment: drinks a fifth of vodka daily   Drug use: Not Currently    Types: Marijuana   Allergies Keflex  [cephalexin ], Cephalosporins, Prednisone , Toradol  [ketorolac  tromethamine ], and Ultram  [tramadol ]  Review of Systems Review of Systems  Physical Exam Vital Signs  I have reviewed the triage vital signs BP 122/68 (BP Location: Left Arm)   Pulse 67   Temp 97.9 F (36.6 C) (Oral)   Resp 16   SpO2 100%   Physical Exam Vitals and nursing note reviewed.  Constitutional:      Appearance: She is not toxic-appearing.  HENT:     Head: Normocephalic and atraumatic.  Eyes:     Pupils: Pupils are equal, round, and reactive to light.  Pulmonary:     Effort: Pulmonary effort is normal.  Abdominal:     General: Abdomen is flat. There is no distension.     Palpations: Abdomen is soft.     Tenderness: There is no  abdominal tenderness.  Musculoskeletal:        General: Normal range of motion.  Cervical back: Normal range of motion.  Neurological:     Mental Status: She is alert. She is disoriented.     Cranial Nerves: No cranial nerve deficit.     Motor: No weakness.     ED Results and Treatments Labs (all labs ordered are listed, but only abnormal results are displayed) Labs Reviewed  COMPREHENSIVE METABOLIC PANEL WITH GFR - Abnormal; Notable for the following components:      Result Value   Sodium 147 (*)    Potassium 3.1 (*)    BUN <5 (*)    All other components within normal limits  ETHANOL - Abnormal; Notable for the following components:   Alcohol , Ethyl (B) 279 (*)    All other components within normal limits  ACETAMINOPHEN  LEVEL - Abnormal; Notable for the following components:   Acetaminophen  (Tylenol ), Serum <10 (*)    All other components within normal limits  SALICYLATE LEVEL - Abnormal; Notable for the following components:   Salicylate Lvl <7.0 (*)    All other components within normal limits  CBC WITH DIFFERENTIAL/PLATELET - Abnormal; Notable for the following components:   MCV 104.9 (*)    MCH 34.4 (*)    Platelets 547 (*)    Neutro Abs 1.5 (*)    All other components within normal limits  I-STAT VENOUS BLOOD GAS, ED - Abnormal; Notable for the following components:   pO2, Ven 28 (*)    Sodium 148 (*)    Potassium 3.1 (*)    Calcium , Ion 1.14 (*)    All other components within normal limits  CK  URINALYSIS, W/ REFLEX TO CULTURE (INFECTION SUSPECTED)  RAPID URINE DRUG SCREEN, HOSP PERFORMED  LEVETIRACETAM  LEVEL  CBG MONITORING, ED                                                                                                                          Radiology CT Head Wo Contrast Result Date: 08/22/2024 EXAM: CT HEAD WITHOUT CONTRAST 08/22/2024 05:15:28 PM TECHNIQUE: CT of the head was performed without the administration of intravenous contrast. Automated  exposure control, iterative reconstruction, and/or weight based adjustment of the mA/kV was utilized to reduce the radiation dose to as low as reasonably achievable. COMPARISON: CT head 06/11/2024 CLINICAL HISTORY: Delirium FINDINGS: BRAIN AND VENTRICLES: No acute hemorrhage. No evidence of acute infarct. Small remote left caudate fg No hydrocephalus. No extra-axial collection. No mass effect or midline shift. ORBITS: No acute abnormality. SINUSES: No acute abnormality. SOFT TISSUES AND SKULL: No acute soft tissue abnormality. No skull fracture. IMPRESSION: 1. No acute intracranial abnormality. Electronically signed by: Gilmore Molt MD 08/22/2024 05:21 PM EST RP Workstation: [id number]    Pertinent labs & imaging results that were available during my care of the patient were reviewed by me and considered in my medical decision making (see MDM for details).  Medications Ordered in ED Medications  diazepam  (VALIUM ) tablet 5 mg (5 mg Oral Given 08/22/24 1657)  lactated ringers  bolus  1,000 mL (0 mLs Intravenous Stopped 08/22/24 1910)  levETIRAcetam  (KEPPRA ) undiluted injection 1,000 mg (1,000 mg Intravenous Given 08/22/24 1725)                                                                                                                                     Procedures Procedures  (including critical care time)  Medical Decision Making / ED Course   This patient presents to the ED for concern of being found down, intoxication, altered mental status, this involves an extensive number of treatment options, and is a complaint that carries with it a high risk of complications and morbidity.  The differential diagnosis includes alcohol  intoxication, seizure disorder, polysubstance use, alcohol  withdrawal, rhabdomyolysis, ICH, electrolyte abnormalities.  MDM: Patient found down, is unable to provide much of a history due to what appears to be a level of intoxication.  Patient with a known history of  alcohol  abuse, also does have a history of alcohol  withdrawal seizures.  I am going to provide the patient with Keppra  and Valium  as I await her labs.  Will obtain imaging of the patient's head.  Blood glucose ordered, do not see 1 reported by EMS.  Patient is moving all extremities spontaneously, does not appear to be in any acute distress.  Reassessment 7:45 PM-patient CT head, per my dependent review, shows no intracranial hemorrhage.  Patient's ethanol elevated.  During her course in the ED, patient perked up, I believe the alcohol  tox occasion was the cause of her problem.  I am answering all questions appropriately.  She is steady on her feet.  Unclear what her baseline ethanol level is.  Patient appears clinically sober.  I have arranged for taxi transport for the patient back to her hotel.  We confirmed her address.  She is appropriate for discharge.  I have sent her a prescription for potassium pills at home.  Additional history obtained: -Additional history obtained from EMS -External records from outside source obtained and reviewed including: Chart review including previous notes, labs, imaging, consultation notes   Lab Tests: -I ordered, reviewed, and interpreted labs.   The pertinent results include:   Labs Reviewed  COMPREHENSIVE METABOLIC PANEL WITH GFR - Abnormal; Notable for the following components:      Result Value   Sodium 147 (*)    Potassium 3.1 (*)    BUN <5 (*)    All other components within normal limits  ETHANOL - Abnormal; Notable for the following components:   Alcohol , Ethyl (B) 279 (*)    All other components within normal limits  ACETAMINOPHEN  LEVEL - Abnormal; Notable for the following components:   Acetaminophen  (Tylenol ), Serum <10 (*)    All other components within normal limits  SALICYLATE LEVEL - Abnormal; Notable for the following components:   Salicylate Lvl <7.0 (*)    All other components within normal limits  CBC WITH DIFFERENTIAL/PLATELET -  Abnormal; Notable  for the following components:   MCV 104.9 (*)    MCH 34.4 (*)    Platelets 547 (*)    Neutro Abs 1.5 (*)    All other components within normal limits  I-STAT VENOUS BLOOD GAS, ED - Abnormal; Notable for the following components:   pO2, Ven 28 (*)    Sodium 148 (*)    Potassium 3.1 (*)    Calcium , Ion 1.14 (*)    All other components within normal limits  CK  URINALYSIS, W/ REFLEX TO CULTURE (INFECTION SUSPECTED)  RAPID URINE DRUG SCREEN, HOSP PERFORMED  LEVETIRACETAM  LEVEL  CBG MONITORING, ED     Imaging Studies ordered: I ordered imaging studies including CT head I independently visualized and interpreted imaging. I agree with the radiologist interpretation   Medicines ordered and prescription drug management: Meds ordered this encounter  Medications   diazepam  (VALIUM ) tablet 5 mg   lactated ringers  bolus 1,000 mL   levETIRAcetam  (KEPPRA ) undiluted injection 1,000 mg   potassium chloride  SA (KLOR-CON  M) 20 MEQ tablet    Sig: Take 1 tablet (20 mEq total) by mouth 2 (two) times daily.    Dispense:  14 tablet    Refill:  0    -I have reviewed the patients home medicines and have made adjustments as needed   Cardiac Monitoring: The patient was maintained on a cardiac monitor.  I personally viewed and interpreted the cardiac monitored which showed an underlying rhythm of: Normal sinus rhythm  Social Determinants of Health:  Factors impacting patients care include: Lack of access to primary care   Reevaluation: After the interventions noted above, I reevaluated the patient and found that they have :improved  Co morbidities that complicate the patient evaluation  Past Medical History:  Diagnosis Date   Alcohol  dependence (HCC)    Anxiety    Chronic pain    Depression    Diverticulitis    Herniated cervical disc    Pancreatitis    Seizures (HCC)    alcoholic seizures      Dispostion: I considered admission for this patient, however the  patient has reassuring medical workup, has achieved clinical sobriety is appropriate for discharge.     Final Clinical Impression(s) / ED Diagnoses Final diagnoses:  Hypokalemia  Alcoholic intoxication without complication     @PCDICTATION @    Mannie Pac T, DO 08/22/24 1949

## 2024-08-22 NOTE — ED Triage Notes (Signed)
 Pt BIB [facility] from motel with reports of being found on the ground and altered. Pt reports ETOH use. PT does not know how she ended up on the ground. No signs of trauma. Pt with hx of seizures.

## 2024-08-22 NOTE — Discharge Instructions (Addendum)
 Continue taking all medications as prescribed.  Have sent your prescription for potassium.  Take 1 pill once in the morning once in the evening for the next 1 week.  Follow-up with your PCP.

## 2024-08-23 ENCOUNTER — Other Ambulatory Visit (HOSPITAL_COMMUNITY): Payer: Self-pay

## 2024-08-29 ENCOUNTER — Inpatient Hospital Stay (HOSPITAL_COMMUNITY)
Admission: EM | Admit: 2024-08-29 | Discharge: 2024-09-05 | DRG: 100 | Disposition: A | Discharge status: Home / Self Care | Attending: Emergency Medicine | Admitting: Emergency Medicine

## 2024-08-29 ENCOUNTER — Ambulatory Visit: Admitting: Neurology

## 2024-08-29 ENCOUNTER — Encounter (HOSPITAL_COMMUNITY): Payer: Self-pay

## 2024-08-29 ENCOUNTER — Other Ambulatory Visit: Payer: Self-pay

## 2024-08-29 ENCOUNTER — Encounter: Payer: Self-pay | Admitting: Neurology

## 2024-08-29 ENCOUNTER — Emergency Department (HOSPITAL_COMMUNITY)

## 2024-08-29 DIAGNOSIS — F329 Major depressive disorder, single episode, unspecified: Secondary | ICD-10-CM | POA: Diagnosis present

## 2024-08-29 DIAGNOSIS — G9340 Encephalopathy, unspecified: Secondary | ICD-10-CM | POA: Diagnosis present

## 2024-08-29 DIAGNOSIS — Z91148 Patient's other noncompliance with medication regimen for other reason: Secondary | ICD-10-CM

## 2024-08-29 DIAGNOSIS — R569 Unspecified convulsions: Principal | ICD-10-CM

## 2024-08-29 DIAGNOSIS — R410 Disorientation, unspecified: Secondary | ICD-10-CM

## 2024-08-29 DIAGNOSIS — Z9049 Acquired absence of other specified parts of digestive tract: Secondary | ICD-10-CM

## 2024-08-29 DIAGNOSIS — F411 Generalized anxiety disorder: Secondary | ICD-10-CM | POA: Diagnosis present

## 2024-08-29 DIAGNOSIS — Z888 Allergy status to other drugs, medicaments and biological substances status: Secondary | ICD-10-CM

## 2024-08-29 DIAGNOSIS — G40909 Epilepsy, unspecified, not intractable, without status epilepticus: Principal | ICD-10-CM | POA: Diagnosis present

## 2024-08-29 DIAGNOSIS — Z6827 Body mass index (BMI) 27.0-27.9, adult: Secondary | ICD-10-CM

## 2024-08-29 DIAGNOSIS — Z90710 Acquired absence of both cervix and uterus: Secondary | ICD-10-CM

## 2024-08-29 DIAGNOSIS — F10239 Alcohol dependence with withdrawal, unspecified: Secondary | ICD-10-CM | POA: Diagnosis present

## 2024-08-29 DIAGNOSIS — E876 Hypokalemia: Secondary | ICD-10-CM | POA: Diagnosis present

## 2024-08-29 DIAGNOSIS — D72829 Elevated white blood cell count, unspecified: Secondary | ICD-10-CM | POA: Diagnosis present

## 2024-08-29 DIAGNOSIS — Z885 Allergy status to narcotic agent status: Secondary | ICD-10-CM

## 2024-08-29 DIAGNOSIS — Z881 Allergy status to other antibiotic agents status: Secondary | ICD-10-CM

## 2024-08-29 DIAGNOSIS — R4701 Aphasia: Secondary | ICD-10-CM | POA: Diagnosis present

## 2024-08-29 DIAGNOSIS — G9341 Metabolic encephalopathy: Secondary | ICD-10-CM | POA: Diagnosis present

## 2024-08-29 DIAGNOSIS — G8929 Other chronic pain: Secondary | ICD-10-CM | POA: Diagnosis present

## 2024-08-29 DIAGNOSIS — F1024 Alcohol dependence with alcohol-induced mood disorder: Secondary | ICD-10-CM | POA: Diagnosis present

## 2024-08-29 DIAGNOSIS — Z79899 Other long term (current) drug therapy: Secondary | ICD-10-CM

## 2024-08-29 DIAGNOSIS — E871 Hypo-osmolality and hyponatremia: Secondary | ICD-10-CM | POA: Diagnosis present

## 2024-08-29 DIAGNOSIS — E669 Obesity, unspecified: Secondary | ICD-10-CM | POA: Diagnosis present

## 2024-08-29 DIAGNOSIS — Z781 Physical restraint status: Secondary | ICD-10-CM

## 2024-08-29 DIAGNOSIS — F22 Delusional disorders: Secondary | ICD-10-CM | POA: Diagnosis present

## 2024-08-29 DIAGNOSIS — Z981 Arthrodesis status: Secondary | ICD-10-CM

## 2024-08-29 DIAGNOSIS — F17210 Nicotine dependence, cigarettes, uncomplicated: Secondary | ICD-10-CM | POA: Diagnosis present

## 2024-08-29 DIAGNOSIS — F322 Major depressive disorder, single episode, severe without psychotic features: Secondary | ICD-10-CM

## 2024-08-29 DIAGNOSIS — I10 Essential (primary) hypertension: Secondary | ICD-10-CM | POA: Diagnosis present

## 2024-08-29 LAB — COMPREHENSIVE METABOLIC PANEL WITH GFR
ALT: 27 U/L (ref 0–44)
AST: 62 U/L — ABNORMAL HIGH (ref 15–41)
Albumin: 3.9 g/dL (ref 3.5–5.0)
Alkaline Phosphatase: 64 U/L (ref 38–126)
Anion gap: 21 — ABNORMAL HIGH (ref 5–15)
BUN: <5 mg/dL — ABNORMAL LOW (ref 8–23)
CO2: 24 mmol/L (ref 22–32)
Calcium: 11.6 mg/dL — ABNORMAL HIGH (ref 8.9–10.3)
Chloride: 87 mmol/L — ABNORMAL LOW (ref 98–111)
Creatinine, Ser: 0.89 mg/dL (ref 0.44–1.00)
GFR, Estimated: >60 mL/min (ref >60)
Glucose, Bld: 122 mg/dL — ABNORMAL HIGH (ref 70–99)
Potassium: 3.6 mmol/L (ref 3.5–5.1)
Sodium: 132 mmol/L — ABNORMAL LOW (ref 135–145)
Total Bilirubin: 1.5 mg/dL — ABNORMAL HIGH (ref 0.0–1.2)
Total Protein: 7 g/dL (ref 6.5–8.1)

## 2024-08-29 LAB — URINALYSIS, ROUTINE W REFLEX MICROSCOPIC
Bacteria, UA: NONE SEEN
Bilirubin Urine: NEGATIVE
Glucose, UA: NEGATIVE mg/dL
Ketones, ur: NEGATIVE mg/dL
Leukocytes,Ua: NEGATIVE
Nitrite: NEGATIVE
Protein, ur: 100 mg/dL — AB
Specific Gravity, Urine: 1.012 (ref 1.005–1.030)
pH: 7 (ref 5.0–8.0)

## 2024-08-29 LAB — CBC
HCT: 43.5 % (ref 36.0–46.0)
Hemoglobin: 15 g/dL (ref 12.0–15.0)
MCH: 34.4 pg — ABNORMAL HIGH (ref 26.0–34.0)
MCHC: 34.5 g/dL (ref 30.0–36.0)
MCV: 99.8 fL (ref 80.0–100.0)
Platelets: 206 K/uL (ref 150–400)
RBC: 4.36 MIL/uL (ref 3.87–5.11)
RDW: 15.1 % (ref 11.5–15.5)
WBC: 11.3 K/uL — ABNORMAL HIGH (ref 4.0–10.5)
nRBC: 0 % (ref 0.0–0.2)

## 2024-08-29 LAB — PHOSPHORUS: Phosphorus: 3.1 mg/dL (ref 2.5–4.6)

## 2024-08-29 LAB — ETHANOL: Alcohol, Ethyl (B): <15 mg/dL (ref <15)

## 2024-08-29 LAB — RAPID URINE DRUG SCREEN, HOSP PERFORMED
Amphetamines: NOT DETECTED
Barbiturates: NOT DETECTED
Benzodiazepines: NOT DETECTED
Cocaine: NOT DETECTED
Opiates: NOT DETECTED
Tetrahydrocannabinol: NOT DETECTED

## 2024-08-29 LAB — MAGNESIUM: Magnesium: 1.4 mg/dL — ABNORMAL LOW (ref 1.7–2.4)

## 2024-08-29 LAB — VITAMIN B12: Vitamin B-12: 615 pg/mL (ref 180–914)

## 2024-08-29 MED ORDER — THIAMINE HCL 100 MG/ML IJ SOLN
500.0000 mg | Freq: Three times a day (TID) | INTRAVENOUS | Status: AC
Start: 2024-08-29 — End: 2024-08-31
  Administered 2024-08-30 – 2024-08-31 (×5): 500 mg via INTRAVENOUS
  Filled 2024-08-29: qty 5
  Filled 2024-08-29: qty 500
  Filled 2024-08-29: qty 5
  Filled 2024-08-29: qty 500
  Filled 2024-08-29 (×2): qty 5
  Filled 2024-08-29: qty 500

## 2024-08-29 MED ORDER — LORAZEPAM 2 MG/ML IJ SOLN
1.0000 mg | Freq: Once | INTRAMUSCULAR | Status: AC
  Administered 2024-08-29: 1 mg via INTRAVENOUS
  Filled 2024-08-29: qty 1

## 2024-08-29 MED ORDER — LORAZEPAM 2 MG/ML IJ SOLN: 0.0000 mg | Freq: Two times a day (BID) | INTRAMUSCULAR | Status: DC

## 2024-08-29 MED ORDER — MAGNESIUM SULFATE 2 GM/50ML IV SOLN
2.0000 g | Freq: Once | INTRAVENOUS | Status: AC
  Administered 2024-08-29: 2 g via INTRAVENOUS
  Filled 2024-08-29: qty 50

## 2024-08-29 MED ORDER — SODIUM CHLORIDE 0.9 % IV BOLUS
1000.0000 mL | Freq: Once | INTRAVENOUS | Status: AC
  Administered 2024-08-30: 1000 mL via INTRAVENOUS

## 2024-08-29 MED ORDER — FOLIC ACID 1 MG PO TABS: 1.0000 mg | ORAL_TABLET | Freq: Every day | ORAL | Status: DC

## 2024-08-29 MED ORDER — LORAZEPAM 2 MG/ML IJ SOLN
0.0000 mg | Freq: Four times a day (QID) | INTRAMUSCULAR | Status: DC
  Administered 2024-08-29: 1 mg via INTRAVENOUS
  Filled 2024-08-29: qty 1

## 2024-08-29 MED ORDER — ADULT MULTIVITAMIN W/MINERALS CH: 1.0000 | ORAL_TABLET | Freq: Every day | ORAL | Status: DC

## 2024-08-29 MED ORDER — LEVETIRACETAM (KEPPRA) 500 MG/5 ML ADULT IV PUSH
1500.0000 mg | Freq: Once | INTRAVENOUS | Status: AC
  Administered 2024-08-29: 1500 mg via INTRAVENOUS
  Filled 2024-08-29: qty 15

## 2024-08-29 NOTE — ED Notes (Signed)
 EDP notified of continued difficulty getting IV access.

## 2024-08-29 NOTE — ED Notes (Signed)
 1 bed bug found on patient. Patient decontaminated. No additional bugs found.

## 2024-08-29 NOTE — ED Notes (Signed)
 Multiple unsuccessful attempts by this RN and second RN at IV insertion. IV team to see.

## 2024-08-29 NOTE — ED Notes (Signed)
 Pt removing all monitoring equipment, very difficult to redirect. MD aware.

## 2024-08-29 NOTE — ED Triage Notes (Signed)
 Pt bib [facility] after pt's husband called EMS and stated that pt was about to have a seizure. On EMS arrival, EMS witnessed grand-mal seizure lasting about 8 seconds. Pt does have hx of seizures. Pt alert but disoriented x4 during triage. Pt ripping off all monitoring equipment, difficult to redirect.

## 2024-08-29 NOTE — ED Provider Notes (Addendum)
 [place] EMERGENCY DEPARTMENT AT [facility] Provider Note   CSN: [id number] Arrival date & time: 08/29/24  1813     Patient presents with: Seizures   [name] is a 68 y.o. female.   68 year old female with a history of seizures and alcohol  dependence who presents to the emergency department for seizure.  EMS was called by the patient's husband because he thought that she was going to have a seizure.  When they arrived she had a grand mall seizure that aborted without any intervention.  Does have a history of admission to the hospital for status epilepticus and heavy alcohol  use as well as medication noncompliance.  Patient is altered and postictal and not able to provide additional history       Prior to Admission medications   Medication Sig Start Date End Date Taking? Authorizing Provider  acetaminophen  (TYLENOL ) 500 MG tablet Take 1 tablet (500 mg total) by mouth every 6 (six) hours as needed for moderate pain (pain score 4-6). 04/01/24   Charlyn Sora, MD  cloBAZam  (ONFI ) 10 MG tablet Take 0.5 tablets (5 mg total) by mouth at bedtime. 06/15/24   Amin, Ankit C, MD  cyanocobalamin  1000 MCG tablet Take 1 tablet (1,000 mcg total) by mouth daily. 09/28/23   Singh, Prashant K, MD  diazePAM , 15 MG Dose, 2 x 7.5 MG/0.1ML LQPK Place 15 mg into the nose as needed. 06/15/24   Amin, Ankit C, MD  DULoxetine  (CYMBALTA ) 30 MG capsule Take 1 capsule (30 mg total) by mouth daily. 01/20/24 02/19/24  Vernon Ranks, MD  erythromycin  ophthalmic ointment Place a 1/2 inch ribbon of ointment into the lower eyelid. 04/01/24   Charlyn Sora, MD  folic acid  (FOLVITE ) 1 MG tablet Take 1 tablet (1 mg total) by mouth daily. 09/28/23   Singh, Prashant K, MD  gabapentin  (NEURONTIN ) 300 MG capsule Take 1 capsule (300 mg total) by mouth 3 (three) times daily. 10/23/23   Dean Clarity, MD  levETIRAcetam  (KEPPRA  XR) 500 MG 24 hr tablet Take 2 tablets (1,000 mg total) by mouth at bedtime. 06/15/24  07/15/24  Caleen Sora BROCKS, MD  menthol -cetylpyridinium (CEPACOL) 3 MG lozenge Take 1 lozenge (3 mg total) by mouth as needed for sore throat. 09/28/23   Singh, Prashant K, MD  methocarbamol  (ROBAXIN ) 500 MG tablet Take 1 tablet (500 mg total) by mouth 2 (two) times daily. 10/23/23   Haviland, Julie, MD  pantoprazole  (PROTONIX ) 40 MG tablet Take 1 tablet (40 mg total) by mouth daily. 01/20/24 03/20/24  Pahwani, Ravi, MD  polyethylene glycol powder (GLYCOLAX /MIRALAX ) 17 GM/SCOOP powder Take 17 g by mouth daily as needed for moderate constipation. 06/15/24   Amin, Ankit C, MD  potassium chloride  SA (KLOR-CON  M) 20 MEQ tablet Take 1 tablet (20 mEq total) by mouth 2 (two) times daily. 08/22/24   Mannie Pac T, DO  risperiDONE  (RISPERDAL ) 0.5 MG tablet Take 1 tablet (0.5 mg total) by mouth 2 (two) times daily. 01/20/24 02/19/24  Vernon Ranks, MD  thiamine  (VITAMIN B1) 100 MG tablet Take 1 tablet (100 mg total) by mouth daily. 09/28/23   Singh, Prashant K, MD  metoCLOPramide  (REGLAN ) 10 MG tablet Take 1 tablet (10 mg total) by mouth every 6 (six) hours as needed for nausea (nausea/headache). 04/06/19 05/24/19  Mesner, Selinda, MD    Allergies: Keflex  [place] ], Cephalosporins, Prednisone , Toradol  [ketorolac  tromethamine ], and Ultram  [tramadol ]    Review of Systems  Updated Vital Signs BP 118/76   Pulse 89   Temp  98.5 F (36.9 C) (Oral)   Resp 18   SpO2 93%   Physical Exam Constitutional:      Comments: Confused.  Eyes are open and occasionally mumbling nonsensical words.  Somewhat agitated  HENT:     Head: Normocephalic and atraumatic.     Mouth/Throat:     Mouth: Mucous membranes are moist.     Pharynx: Oropharynx is clear.  Eyes:     Extraocular Movements: Extraocular movements intact.     Conjunctiva/sclera: Conjunctivae normal.     Pupils: Pupils are equal, round, and reactive to light.     Comments: Pupils 5 mm bilaterally  Cardiovascular:     Rate and Rhythm: Regular rhythm. Tachycardia  present.  Pulmonary:     Effort: Pulmonary effort is normal.     Breath sounds: Normal breath sounds.  Musculoskeletal:     Cervical back: Normal range of motion and neck supple.  Neurological:     Mental Status: She is alert.     Comments: Moving all 4 extremities.  No obvious cranial nerve deficits.  Unable to cooperate with exam otherwise     (all labs ordered are listed, but only abnormal results are displayed) Labs Reviewed  COMPREHENSIVE METABOLIC PANEL WITH GFR - Abnormal; Notable for the following components:      Result Value   Sodium 132 (*)    Chloride 87 (*)    Glucose, Bld 122 (*)    BUN <5 (*)    Calcium  11.6 (*)    AST 62 (*)    Total Bilirubin 1.5 (*)    Anion gap 21 (*)    All other components within normal limits  CBC - Abnormal; Notable for the following components:   WBC 11.3 (*)    MCH 34.4 (*)    All other components within normal limits  URINALYSIS, ROUTINE W REFLEX MICROSCOPIC - Abnormal; Notable for the following components:   Hgb urine dipstick SMALL (*)    Protein, ur 100 (*)    All other components within normal limits  MAGNESIUM  - Abnormal; Notable for the following components:   Magnesium  1.4 (*)    All other components within normal limits  PHOSPHORUS  ETHANOL  RAPID URINE DRUG SCREEN, HOSP PERFORMED  LEVETIRACETAM  LEVEL  VITAMIN B1  VITAMIN B12  CBG MONITORING, ED    EKG: EKG Interpretation Date/Time:  Tuesday August 29 2024 19:53:51 EST Ventricular Rate:  103 PR Interval:  181 QRS Duration:  89 QT Interval:  366 QTC Calculation: 480 R Axis:   -70  Text Interpretation: Sinus tachycardia Probable left atrial enlargement Left anterior fascicular block Abnormal R-wave progression, late transition Confirmed by Yolande Charleston [phone]) on 08/29/2024 9:39:13 PM  Radiology: CT Head Wo Contrast Result Date: 08/29/2024 EXAM: CT HEAD WITHOUT CONTRAST 08/29/2024 10:58:06 PM TECHNIQUE: CT of the head was performed without the  administration of intravenous contrast. Automated exposure control, iterative reconstruction, and/or weight based adjustment of the mA/kV was utilized to reduce the radiation dose to as low as reasonably achievable. COMPARISON: 08/22/2024 CLINICAL HISTORY: ams FINDINGS: BRAIN AND VENTRICLES: No acute hemorrhage. No evidence of acute infarct. No hydrocephalus. No extra-axial collection. No mass effect or midline shift. Global cortical atrophy. Subcortical and periventricular small vessel ischemic changes. ORBITS: No acute abnormality. SINUSES: No acute abnormality. SOFT TISSUES AND SKULL: No acute soft tissue abnormality. No skull fracture. IMPRESSION: 1. No acute intracranial abnormality. Electronically signed by: Pinkie Pebbles MD 08/29/2024 11:02 PM EST RP Workstation: [id number]  Procedures   Medications Ordered in the ED  folic acid  (FOLVITE ) tablet 1 mg (has no administration in time range)  multivitamin with minerals tablet 1 tablet (has no administration in time range)  LORazepam  (ATIVAN ) injection 0-4 mg (has no administration in time range)    Followed by  LORazepam  (ATIVAN ) injection 0-4 mg (has no administration in time range)  thiamine  (VITAMIN B1) 500 mg in sodium chloride  0.9 % 50 mL IVPB (has no administration in time range)  sodium chloride  0.9 % bolus 1,000 mL (has no administration in time range)  LORazepam  (ATIVAN ) injection 1 mg (1 mg Intravenous Given 08/29/24 1946)  levETIRAcetam  (KEPPRA ) undiluted injection 1,500 mg (1,500 mg Intravenous Given 08/29/24 1943)  magnesium  sulfate IVPB 2 g 50 mL (0 g Intravenous Stopped 08/29/24 2236)    Clinical Course as of 08/29/24 2354  Tue Aug 29, 2024  2328 Discussed with Dr Vanessa who will evaluate the patient.  [RP]  2352 Dr Charlton from hospitalist consulted for admission.  [RP]    Clinical Course User Index [RP] Yolande Lamar BROCKS, MD                                 Medical Decision Making Amount and/or Complexity of  Data Reviewed Labs: ordered. Radiology: ordered.  Risk Prescription drug management. Decision regarding hospitalization.   [name] is a 68 year old female with a history of seizures and alcohol  dependence who presents to the emergency department for seizure.   Initial Ddx:  Seizure, postictal state, Warnicke's encephalopathy, alcohol  withdrawal, alcohol  intoxication, substance use, electrolyte abnormality, meningitis/encephalitis  MDM/Course:  Patient presents to the emergency department with a seizure.  Appears that she supposed to be on Keppra  and Onfi  but does have a history of medication noncompliance.  Did have witnessed seizure by EMS that spontaneously aborted.  She is encephalopathic on my exam.  Blood pressure WNL.  She is afebrile.  Her neck is supple.  She does not have any focal neurologic deficits.  Mild hyponatremia with sodium 132 without any other significant electrolyte abnormalities aside from mild elevation in her calcium  to 11.6 and hypomagnesemia of 1.4.  Was given IV fluids for this.  Magnesium  was also replenished via IV.  PTH and CXR ordered to investigate hypercalcemia. Urinalysis without evidence of UTI and rapid drug screen is pending.  Did also send a random Keppra  level on her and B12.  Ethanol level is undetectably low.  Upon re-evaluation remained confused.  CT head without acute abnormality.  Suspect that she likely is postictal but could also have wernike's encephalopathy so high-dose thiamine  was ordered.  Neuro consulted and will see the patient and eval for EEG since she is still confused. Did attempt multiple times to reach the patient's husband and daughter for collateral information without response.   This patient presents to the ED for concern of complaints listed in HPI, this involves an extensive number of treatment options, and is a complaint that carries with it a high risk of complications and morbidity. Disposition including potential need  for admission considered.   Dispo: Admit  Additional history obtained from EMS Records reviewed ED Visit Notes The following labs were independently interpreted: Chemistry and show hypercalcemia I independently reviewed the following imaging with scope of interpretation limited to determining acute life threatening conditions related to emergency care: CT Head and agree with the radiologist interpretation with the following exceptions: none I personally reviewed  and interpreted cardiac monitoring: normal sinus rhythm  I personally reviewed and interpreted the pt's EKG: see above for interpretation  I have reviewed the patients home medications and made adjustments as needed Consults: Hospitalist and Neurology Social Determinants of health:  Geriatric  Portions of this note were generated with Scientist, clinical (histocompatibility and immunogenetics). Dictation errors may occur despite best attempts at proofreading.     Final diagnoses:  Seizure [facility])  Delirium    ED Discharge Orders     None          Yolande Lamar BROCKS, MD 08/29/24 2334    Yolande Lamar BROCKS, MD 08/29/24 2340

## 2024-08-30 ENCOUNTER — Observation Stay (HOSPITAL_COMMUNITY)

## 2024-08-30 ENCOUNTER — Other Ambulatory Visit: Payer: Self-pay

## 2024-08-30 ENCOUNTER — Encounter (HOSPITAL_COMMUNITY): Payer: Self-pay | Admitting: Family Medicine

## 2024-08-30 DIAGNOSIS — F411 Generalized anxiety disorder: Secondary | ICD-10-CM | POA: Diagnosis present

## 2024-08-30 DIAGNOSIS — R569 Unspecified convulsions: Secondary | ICD-10-CM | POA: Diagnosis present

## 2024-08-30 DIAGNOSIS — Z981 Arthrodesis status: Secondary | ICD-10-CM | POA: Diagnosis not present

## 2024-08-30 DIAGNOSIS — G9341 Metabolic encephalopathy: Secondary | ICD-10-CM | POA: Diagnosis present

## 2024-08-30 DIAGNOSIS — F329 Major depressive disorder, single episode, unspecified: Secondary | ICD-10-CM | POA: Diagnosis present

## 2024-08-30 DIAGNOSIS — G40409 Other generalized epilepsy and epileptic syndromes, not intractable, without status epilepticus: Secondary | ICD-10-CM | POA: Diagnosis not present

## 2024-08-30 DIAGNOSIS — G40909 Epilepsy, unspecified, not intractable, without status epilepticus: Secondary | ICD-10-CM | POA: Diagnosis present

## 2024-08-30 DIAGNOSIS — E871 Hypo-osmolality and hyponatremia: Secondary | ICD-10-CM | POA: Diagnosis present

## 2024-08-30 DIAGNOSIS — Z6827 Body mass index (BMI) 27.0-27.9, adult: Secondary | ICD-10-CM | POA: Diagnosis not present

## 2024-08-30 DIAGNOSIS — E669 Obesity, unspecified: Secondary | ICD-10-CM | POA: Diagnosis present

## 2024-08-30 DIAGNOSIS — Z781 Physical restraint status: Secondary | ICD-10-CM | POA: Diagnosis not present

## 2024-08-30 DIAGNOSIS — F1024 Alcohol dependence with alcohol-induced mood disorder: Secondary | ICD-10-CM | POA: Diagnosis present

## 2024-08-30 DIAGNOSIS — F10239 Alcohol dependence with withdrawal, unspecified: Secondary | ICD-10-CM | POA: Diagnosis present

## 2024-08-30 DIAGNOSIS — E876 Hypokalemia: Secondary | ICD-10-CM | POA: Diagnosis present

## 2024-08-30 DIAGNOSIS — Z885 Allergy status to narcotic agent status: Secondary | ICD-10-CM | POA: Diagnosis not present

## 2024-08-30 DIAGNOSIS — Z888 Allergy status to other drugs, medicaments and biological substances status: Secondary | ICD-10-CM | POA: Diagnosis not present

## 2024-08-30 DIAGNOSIS — R4701 Aphasia: Secondary | ICD-10-CM | POA: Diagnosis present

## 2024-08-30 DIAGNOSIS — I10 Essential (primary) hypertension: Secondary | ICD-10-CM | POA: Diagnosis present

## 2024-08-30 DIAGNOSIS — F22 Delusional disorders: Secondary | ICD-10-CM | POA: Diagnosis present

## 2024-08-30 DIAGNOSIS — Z79899 Other long term (current) drug therapy: Secondary | ICD-10-CM | POA: Diagnosis not present

## 2024-08-30 DIAGNOSIS — F17210 Nicotine dependence, cigarettes, uncomplicated: Secondary | ICD-10-CM | POA: Diagnosis present

## 2024-08-30 DIAGNOSIS — Z9049 Acquired absence of other specified parts of digestive tract: Secondary | ICD-10-CM | POA: Diagnosis not present

## 2024-08-30 DIAGNOSIS — G9340 Encephalopathy, unspecified: Secondary | ICD-10-CM | POA: Diagnosis not present

## 2024-08-30 DIAGNOSIS — Z881 Allergy status to other antibiotic agents status: Secondary | ICD-10-CM | POA: Diagnosis not present

## 2024-08-30 LAB — CBC
HCT: 38.6 % (ref 36.0–46.0)
Hemoglobin: 13.2 g/dL (ref 12.0–15.0)
MCH: 34 pg (ref 26.0–34.0)
MCHC: 34.2 g/dL (ref 30.0–36.0)
MCV: 99.5 fL (ref 80.0–100.0)
Platelets: 179 K/uL (ref 150–400)
RBC: 3.88 MIL/uL (ref 3.87–5.11)
RDW: 14.6 % (ref 11.5–15.5)
WBC: 8 K/uL (ref 4.0–10.5)
nRBC: 0 % (ref 0.0–0.2)

## 2024-08-30 LAB — COMPREHENSIVE METABOLIC PANEL WITH GFR
ALT: 22 U/L (ref 0–44)
AST: 50 U/L — ABNORMAL HIGH (ref 15–41)
Albumin: 3.7 g/dL (ref 3.5–5.0)
Alkaline Phosphatase: 56 U/L (ref 38–126)
Anion gap: 16 — ABNORMAL HIGH (ref 5–15)
BUN: 6 mg/dL — ABNORMAL LOW (ref 8–23)
CO2: 27 mmol/L (ref 22–32)
Calcium: 9.5 mg/dL (ref 8.9–10.3)
Chloride: 97 mmol/L — ABNORMAL LOW (ref 98–111)
Creatinine, Ser: 0.72 mg/dL (ref 0.44–1.00)
GFR, Estimated: >60 mL/min (ref >60)
Glucose, Bld: 62 mg/dL — ABNORMAL LOW (ref 70–99)
Potassium: 3.2 mmol/L — ABNORMAL LOW (ref 3.5–5.1)
Sodium: 140 mmol/L (ref 135–145)
Total Bilirubin: 1.7 mg/dL — ABNORMAL HIGH (ref 0.0–1.2)
Total Protein: 6.6 g/dL (ref 6.5–8.1)

## 2024-08-30 LAB — TSH: TSH: 1.124 u[IU]/mL (ref 0.350–4.500)

## 2024-08-30 LAB — MAGNESIUM: Magnesium: 1.7 mg/dL (ref 1.7–2.4)

## 2024-08-30 LAB — RPR: RPR Ser Ql: NONREACTIVE

## 2024-08-30 LAB — FOLATE: Folate: 12.7 ng/mL (ref >5.9)

## 2024-08-30 MED ORDER — DULOXETINE HCL 30 MG PO CPEP
30.0000 mg | ORAL_CAPSULE | Freq: Every day | ORAL | Status: DC
  Administered 2024-08-31 – 2024-09-05 (×6): 30 mg via ORAL
  Filled 2024-08-30 (×6): qty 1

## 2024-08-30 MED ORDER — CLOBAZAM 5 MG PO HALF TABLET
5.0000 mg | ORAL_TABLET | Freq: Every day | ORAL | Status: DC
  Administered 2024-08-30 – 2024-09-04 (×6): 5 mg via ORAL
  Filled 2024-08-30 (×6): qty 1

## 2024-08-30 MED ORDER — ENOXAPARIN SODIUM 40 MG/0.4ML IJ SOSY
40.0000 mg | PREFILLED_SYRINGE | INTRAMUSCULAR | Status: DC
  Administered 2024-08-30 – 2024-09-04 (×6): 40 mg via SUBCUTANEOUS
  Filled 2024-08-30 (×6): qty 0.4

## 2024-08-30 MED ORDER — LORAZEPAM 2 MG/ML IJ SOLN
1.0000 mg | INTRAMUSCULAR | Status: DC | PRN
  Administered 2024-08-30: 2 mg via INTRAVENOUS
  Administered 2024-09-01: 1 mg via INTRAVENOUS
  Filled 2024-08-30 (×2): qty 1

## 2024-08-30 MED ORDER — SODIUM CHLORIDE 0.9% FLUSH
3.0000 mL | Freq: Two times a day (BID) | INTRAVENOUS | Status: DC
  Administered 2024-08-30 – 2024-09-05 (×11): 3 mL via INTRAVENOUS

## 2024-08-30 MED ORDER — LORAZEPAM 2 MG/ML IJ SOLN
0.0000 mg | INTRAMUSCULAR | Status: DC
  Administered 2024-08-30: 2 mg via INTRAVENOUS
  Administered 2024-08-30: 1 mg via INTRAVENOUS
  Administered 2024-08-30: 2 mg via INTRAVENOUS
  Administered 2024-08-31 – 2024-09-01 (×3): 1 mg via INTRAVENOUS
  Filled 2024-08-30 (×6): qty 1

## 2024-08-30 MED ORDER — LORAZEPAM 1 MG PO TABS: 1.0000 mg | ORAL_TABLET | ORAL | Status: DC | PRN

## 2024-08-30 MED ORDER — SODIUM CHLORIDE 0.9 % IV SOLN
200.0000 mg | Freq: Once | INTRAVENOUS | Status: AC
  Administered 2024-08-30: 200 mg via INTRAVENOUS
  Filled 2024-08-30: qty 20

## 2024-08-30 MED ORDER — LEVETIRACETAM ER 500 MG PO TB24: 1000.0000 mg | ORAL_TABLET | Freq: Every day | ORAL | Status: DC

## 2024-08-30 MED ORDER — LEVETIRACETAM (KEPPRA) 500 MG/5 ML ADULT IV PUSH
1000.0000 mg | Freq: Two times a day (BID) | INTRAVENOUS | Status: DC
  Administered 2024-08-30 – 2024-09-05 (×13): 1000 mg via INTRAVENOUS
  Filled 2024-08-30 (×13): qty 10

## 2024-08-30 MED ORDER — ACETAMINOPHEN 650 MG RE SUPP: 650.0000 mg | Freq: Four times a day (QID) | RECTAL | Status: DC | PRN

## 2024-08-30 MED ORDER — PANTOPRAZOLE SODIUM 40 MG PO TBEC
40.0000 mg | DELAYED_RELEASE_TABLET | Freq: Every day | ORAL | Status: DC
  Administered 2024-08-31 – 2024-09-05 (×6): 40 mg via ORAL
  Filled 2024-08-30 (×6): qty 1

## 2024-08-30 MED ORDER — GABAPENTIN 300 MG PO CAPS
300.0000 mg | ORAL_CAPSULE | Freq: Three times a day (TID) | ORAL | Status: DC
  Administered 2024-08-30 – 2024-09-05 (×17): 300 mg via ORAL
  Filled 2024-08-30 (×17): qty 1

## 2024-08-30 MED ORDER — RISPERIDONE 0.5 MG PO TABS
0.5000 mg | ORAL_TABLET | Freq: Two times a day (BID) | ORAL | Status: DC
  Administered 2024-08-30 – 2024-09-05 (×12): 0.5 mg via ORAL
  Filled 2024-08-30 (×12): qty 1

## 2024-08-30 MED ORDER — ADULT MULTIVITAMIN W/MINERALS CH
1.0000 | ORAL_TABLET | Freq: Every day | ORAL | Status: DC
  Administered 2024-08-31 – 2024-09-05 (×6): 1 via ORAL
  Filled 2024-08-30 (×6): qty 1

## 2024-08-30 MED ORDER — ACETAMINOPHEN 325 MG PO TABS
650.0000 mg | ORAL_TABLET | Freq: Four times a day (QID) | ORAL | Status: DC | PRN
  Administered 2024-08-31 – 2024-09-04 (×10): 650 mg via ORAL
  Filled 2024-08-30 (×10): qty 2

## 2024-08-30 MED ORDER — PROCHLORPERAZINE EDISYLATE 10 MG/2ML IJ SOLN
5.0000 mg | Freq: Four times a day (QID) | INTRAMUSCULAR | Status: DC | PRN
  Administered 2024-09-04: 5 mg via INTRAVENOUS
  Filled 2024-08-30: qty 2

## 2024-08-30 MED ORDER — SENNOSIDES-DOCUSATE SODIUM 8.6-50 MG PO TABS
1.0000 | ORAL_TABLET | Freq: Every evening | ORAL | Status: DC | PRN
Start: 2024-08-30 — End: 2024-09-02

## 2024-08-30 MED ORDER — FOLIC ACID 1 MG PO TABS
1.0000 mg | ORAL_TABLET | Freq: Every day | ORAL | Status: DC
  Administered 2024-08-31 – 2024-09-05 (×6): 1 mg via ORAL
  Filled 2024-08-30 (×6): qty 1

## 2024-08-30 MED ORDER — LORAZEPAM 2 MG/ML IJ SOLN
0.0000 mg | Freq: Three times a day (TID) | INTRAMUSCULAR | Status: DC
  Administered 2024-09-01: 1 mg via INTRAVENOUS
  Administered 2024-09-02 (×2): 2 mg via INTRAVENOUS
  Filled 2024-08-30 (×3): qty 1

## 2024-08-30 NOTE — ED Notes (Signed)
 Messaged pharmacy about missing thiamine  medication. Medication not received

## 2024-08-30 NOTE — Consult Note (Addendum)
 NEUROLOGY CONSULT NOTE   Date of service: August 30, 2024 Patient Name: Kenlee Vogt MRN:  [id number] DOB:  01/28/1956 Chief Complaint: seizure, prolonged post ictal vs subclinical seizures Requesting Provider: Charlton Evalene RAMAN, MD  History of Present Illness  Nykiah Mahi Zabriskie is a 68 y.o. female with hx of EtOH use, Seizures and supposed to be on Keppra , gabapentin  and onfi  and noncompliance with her AEDs who presents with Grand mal seizure at home that self aborted.  She was observed in the Ed for over 5 hours and still encephalopathic with aphasia out of proportion to encephalopathy prompting neurology consult. She was loaded with Keppra  1500 and did get a total of 2mg  of ativan  in the ED.  She had a CT Head w/o contrast which is negative for any acute abnormalities.  She is unable to provide any meaningful history. Its seems like back in may 2023, she had post ictal R sided weakness and aphasia.  Unable to get in touch with significant other over phone to get more history.   ROS  Unable to ascertain due to encephalopathy and aphasia.  Past History   Past Medical History:  Diagnosis Date   Alcohol  dependence (HCC)    Anxiety    Chronic pain    Depression    Diverticulitis    Herniated cervical disc    Pancreatitis    Seizures (HCC)    alcoholic seizures    Past Surgical History:  Procedure Laterality Date   ABDOMINAL HYSTERECTOMY     ANKLE SURGERY Right    CARPAL TUNNEL RELEASE Bilateral    CERVICAL FUSION     CHOLECYSTECTOMY     KNEE SURGERY      Family History: Family History  Family history unknown: Yes    Social History  reports that she has been smoking cigarettes. She has a 1.8 pack-year smoking history. She has been exposed to tobacco smoke. She has never used smokeless tobacco. She reports current alcohol  use. She reports that she does not currently use drugs after having used the following drugs: Marijuana.  Allergies  Allergen  Reactions   Keflex  [Cephalexin ] Hives   Cephalosporins Hives   Prednisone  Swelling   Toradol  [Ketorolac  Tromethamine ] Hives   Ultram  [Tramadol ] Hives    Medications   Current Facility-Administered Medications:    acetaminophen  (TYLENOL ) tablet 650 mg, 650 mg, Oral, Q6H PRN **OR** acetaminophen  (TYLENOL ) suppository 650 mg, 650 mg, Rectal, Q6H PRN, Opyd, Evalene RAMAN, MD   cloBAZam  (ONFI ) tablet 5 mg, 5 mg, Oral, QHS, Opyd, Timothy S, MD   DULoxetine  (CYMBALTA ) DR capsule 30 mg, 30 mg, Oral, Daily, Opyd, Timothy S, MD   enoxaparin  (LOVENOX ) injection 40 mg, 40 mg, Subcutaneous, Q24H, Opyd, Timothy S, MD   folic acid  (FOLVITE ) tablet 1 mg, 1 mg, Oral, Daily, Opyd, Timothy S, MD   gabapentin  (NEURONTIN ) capsule 300 mg, 300 mg, Oral, TID, Opyd, Timothy S, MD   levETIRAcetam  (KEPPRA ) undiluted injection 1,000 mg, 1,000 mg, Intravenous, Q12H, Opyd, Timothy S, MD   LORazepam  (ATIVAN ) injection 0-4 mg, 0-4 mg, Intravenous, Q4H **FOLLOWED BY** [START ON 09/01/2024] LORazepam  (ATIVAN ) injection 0-4 mg, 0-4 mg, Intravenous, Q8H, Opyd, Timothy S, MD   LORazepam  (ATIVAN ) tablet 1-4 mg, 1-4 mg, Oral, Q1H PRN **OR** LORazepam  (ATIVAN ) injection 1-4 mg, 1-4 mg, Intravenous, Q1H PRN, Opyd, Timothy S, MD   multivitamin with minerals tablet 1 tablet, 1 tablet, Oral, Daily, Opyd, Timothy S, MD   pantoprazole  (PROTONIX ) EC tablet 40 mg, 40 mg, Oral, Daily,  Opyd, Timothy S, MD   prochlorperazine (COMPAZINE) injection 5 mg, 5 mg, Intravenous, Q6H PRN, Opyd, Timothy S, MD   risperiDONE  (RISPERDAL ) tablet 0.5 mg, 0.5 mg, Oral, BID, Opyd, Timothy S, MD   senna-docusate (Senokot-S) tablet 1 tablet, 1 tablet, Oral, QHS PRN, Opyd, Timothy S, MD   sodium chloride  flush (NS) 0.9 % injection 3 mL, 3 mL, Intravenous, Q12H, Opyd, Timothy S, MD   thiamine  (VITAMIN B1) 500 mg in sodium chloride  0.9 % 50 mL IVPB, 500 mg, Intravenous, TID, Opyd, Timothy S, MD, Stopped at 08/30/24 0047  Current Outpatient Medications:     acetaminophen  (TYLENOL ) 500 MG tablet, Take 1 tablet (500 mg total) by mouth every 6 (six) hours as needed for moderate pain (pain score 4-6)., Disp: 30 tablet, Rfl: 0   cloBAZam  (ONFI ) 10 MG tablet, Take 0.5 tablets (5 mg total) by mouth at bedtime., Disp: 15 tablet, Rfl: 0   cyanocobalamin  1000 MCG tablet, Take 1 tablet (1,000 mcg total) by mouth daily., Disp: 30 tablet, Rfl: 2   diazePAM , 15 MG Dose, 2 x 7.5 MG/0.1ML LQPK, Place 15 mg into the nose as needed., Disp: 5 each, Rfl: 5   DULoxetine  (CYMBALTA ) 30 MG capsule, Take 1 capsule (30 mg total) by mouth daily., Disp: 30 capsule, Rfl: 0   erythromycin  ophthalmic ointment, Place a 1/2 inch ribbon of ointment into the lower eyelid., Disp: 3.5 g, Rfl: 0   folic acid  (FOLVITE ) 1 MG tablet, Take 1 tablet (1 mg total) by mouth daily., Disp: 30 tablet, Rfl: 2   gabapentin  (NEURONTIN ) 300 MG capsule, Take 1 capsule (300 mg total) by mouth 3 (three) times daily., Disp: 90 capsule, Rfl: 0   levETIRAcetam  (KEPPRA  XR) 500 MG 24 hr tablet, Take 2 tablets (1,000 mg total) by mouth at bedtime., Disp: 60 tablet, Rfl: 0   menthol -cetylpyridinium (CEPACOL) 3 MG lozenge, Take 1 lozenge (3 mg total) by mouth as needed for sore throat., Disp: 100 tablet, Rfl: 12   methocarbamol  (ROBAXIN ) 500 MG tablet, Take 1 tablet (500 mg total) by mouth 2 (two) times daily., Disp: 20 tablet, Rfl: 0   pantoprazole  (PROTONIX ) 40 MG tablet, Take 1 tablet (40 mg total) by mouth daily., Disp: 30 tablet, Rfl: 0   polyethylene glycol powder (GLYCOLAX /MIRALAX ) 17 GM/SCOOP powder, Take 17 g by mouth daily as needed for moderate constipation., Disp: 238 g, Rfl: 0   potassium chloride  SA (KLOR-CON  M) 20 MEQ tablet, Take 1 tablet (20 mEq total) by mouth 2 (two) times daily., Disp: 14 tablet, Rfl: 0   risperiDONE  (RISPERDAL ) 0.5 MG tablet, Take 1 tablet (0.5 mg total) by mouth 2 (two) times daily., Disp: 60 tablet, Rfl: 0   thiamine  (VITAMIN B1) 100 MG tablet, Take 1 tablet (100 mg total) by  mouth daily., Disp: 30 tablet, Rfl: 2  Vitals   Vitals:   08/30/24 0100 08/30/24 0130 08/30/24 0145 08/30/24 0200  BP: 134/71 131/66 136/69 101/71  Pulse: 87 78 91 95  Resp: (!) 21 19 18  (!) 25  Temp:      TempSrc:      SpO2: 99% 98% 94% 100%    There is no height or weight on file to calculate BMI.   Physical Exam   General: dishelved, snoring, in no acute distress. HENT: Normal oropharynx and mucosa. Normal external appearance of ears and nose.  Neck: Supple, no pain or tenderness  CV: No JVD. No peripheral edema.  Pulmonary: Symmetric Chest rise. Normal respiratory effort.  Abdomen: Soft  to touch, non-tender.  Ext: No cyanosis, edema, or deformity  Skin: No rash. Normal palpation of skin.   Musculoskeletal: Normal digits and nails by inspection. No clubbing.   Neurologic Examination  Mental status/Cognition: opens eyes to loud voice, does not answer any orientation questions. Requires constant stimulation to keep her awake. Speech/language: incomprehensible sounds, does not follow commands. Cranial nerves:   CN II Pupils equal and reactive to light, blinks to threat BL   CN III,IV,VI EOM intact, no gaze preference or deviation, makes eye contact on let and right   CN V normal sensation in V1, V2, and V3 segments bilaterally   CN VII no asymmetry, no nasolabial fold flattening    CN VIII normal hearing to speech    CN IX & X Gag intact to tongue depressor   CN XI Head is midline   CN XII Does not protrude tongue on command.   Sensory/Motor: No obviosu jerking concerning for seizures noted. Muscle bulk: poor, tone normal Moves all extremities spontaneously and antigravity. Localizes to proximal ppinch in all extremities.  Coordination/Complex Motor:  Unable to assess  Labs/Imaging/Neurodiagnostic studies   CBC:  Recent Labs  Lab 11-Sep-2024 1819  WBC 11.3*  HGB 15.0  HCT 43.5  MCV 99.8  PLT 206   Basic Metabolic Panel:  Lab Results  Component Value Date    NA 132 (L) 09-11-24   K 3.6 09/11/24   CO2 24 09-11-2024   GLUCOSE 122 (H) 2024/09/11   BUN <5 (L) 2024-09-11   CREATININE 0.89 09-11-24   CALCIUM  11.6 (H) 09-11-2024   GFRNONAA >60 09/11/24   GFRAA >60 06/09/2020   Lipid Panel:  Lab Results  Component Value Date   LDLCALC 58 06/09/2020   HgbA1c:  Lab Results  Component Value Date   HGBA1C 4.5 (L) 06/11/2024   Urine Drug Screen:     Component Value Date/Time   LABOPIA NONE DETECTED 09-11-24 2230   COCAINSCRNUR NONE DETECTED 2024-09-11 2230   LABBENZ NONE DETECTED 09/11/24 2230   AMPHETMU NONE DETECTED Sep 11, 2024 2230   THCU NONE DETECTED September 11, 2024 2230   LABBARB NONE DETECTED September 11, 2024 2230    Alcohol  Level     Component Value Date/Time   ETH <15 Sep 11, 2024 1833   INR  Lab Results  Component Value Date   INR 1.0 05/17/2022   APTT  Lab Results  Component Value Date   APTT 22 (L) 05/17/2022   AED levels:  Lab Results  Component Value Date   PHENYTOIN  <2.5 (L) 10/17/2022   LEVETIRACETA 168.0 (H) 06/11/2024    CT Head without contrast(Personally reviewed): CTH was negative for a large hypodensity concerning for a large territory infarct or hyperdensity concerning for an ICH  Neurodiagnostics cEEG:  pending  ASSESSMENT   Katryna Theora Vankirk is a 68 y.o. female  with hx of EtOH use, Seizures and supposed to be on Keppra , gabapentin  and onfi  and noncompliance with her AEDs who presents with Grand mal seizure at home that self aborted.  She was observed in the Ed for over 5 hours and still encephalopathic with aphasia out of proportion to encephalopathy prompting neurology consult. She was loaded with Keppra  1500 and did get a total of 2mg  of ativan  in the ED.  She had a CT Head w/o contrast which is negative for any acute abnormalities.  She is unable to provide any meaningful history. Its seems like back in may 2023, she had post ictal R sided weakness and aphasia.  Neuro exam  with no  obvious jerking concerning for seizures but notable global aphasia with incomprehensible sounds. Still suspect that this is likely post ictal as she has had aphasia in the post ictal phase in the past.  RECOMMENDATIONS  - cEEG - Agree with Keppra  1500mg  IV once, continue Keppra  1000mg  BID - Onfi  0.5mg  at bedtime, Gabapentin  300mg  TID - Will load with Vimpat  200mg  Iv once for now and start Vimpat  100mg  BID until she is awake enough to take PO Onfi  and Gabapentin . ______________________________________________________________________  Plan discussed with Dr. Yolande with the ED team.  This patient is critically ill and at significant risk of neurological worsening, death and care requires constant monitoring of vital signs, hemodynamics,respiratory and cardiac monitoring, neurological assessment, discussion with family, other specialists and medical decision making of high complexity. I spent 60 minutes of neurocritical care time  in the care of  this patient. This was time spent independent of any time provided by nurse practitioner or PA.  [place] [facility] 08/30/2024  3:47 AM   Signed, Jona Erkkila, MD [facility]

## 2024-08-30 NOTE — Progress Notes (Signed)
 PROGRESS NOTE  Nadra Hritz FMW:[id number] DOB: November 30, 1955 DOA: 08/29/2024 PCP: Lenon Nell SAILOR, FNP   LOS: 0 days   Brief narrative:  [name] is a 68 y.o. female with medical history significant for alcohol  dependence, depression, paranoia, hypertension, seizures, and nonadherence with medications presented to hospital with confusion and restlessness after having a generalized self-limited seizure at home and subsequently remained encephalopathic.  In the ED patient had tachycardia and hypotension.  Labs were notable for hyponatremia with magnesium  of 1.4 and calcium  of 11.6 with mild leukocytosis at 11 300.  Urine drug screen was negative including alcohol  levels.  Neurology was consulted and patient was given 2 g of IV magnesium  sulfate 1 mg of Ativan  and 500 mg of thiamine  in IV Keppra  1500 mg.  Patient was then admitted hospital for further evaluation and treatment.  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 Continue supportive care.  Aspiration precautions.  On continuous EEG monitoring.  Received 1500 mg of care IV Keppra  in the ED.  CT head scan was negative for acute findings.  Vitamin B12 normal.  Neurology on board.  Also receiving IVF improved.  RPR was nonreactive.  TSH within normal range.  Urine drug screen was negative urinalysis without signs of infection.  Generalized seizures.  Continue Keppra  and IV Vimpat .  Will resume Onfi  and gabapentin  when p.o. okay.  History of alcohol  dependence.  On CIWA protocol.  On wrist restraints as well.  Watch closely for withdrawal.  Currently somnolent.  Hypomagnesemia.  Replenished.  Check levels.  Hypercalcemia.  Received IV fluids  will continue.  Check calcium  levels in AM.  Check PTH.  Mild hyponatremia.  Will continue to monitor.  Check BMP in AM.  DVT prophylaxis: enoxaparin  (LOVENOX ) injection 40 mg Start: 08/30/24 1400   Disposition: Uncertain at this time, likely home in 1 to 2 days  Status is: Observation The patient will require care spanning > 2 midnights and should be moved to inpatient because: Somnolence, pending clinical improvement    Code Status:     Code Status: Full Code  Family Communication: None at bedside  Consultants: Neurology  Procedures: Continuous EEG  Anti-infectives:  None  Anti-infectives (From admission, onward)    None       Subjective: Today, patient was seen and examined at bedside.  Patient appears to be somnolent and barely Communicative.  On restraints  Objective: Vitals:   08/30/24 0921 08/30/24 0945  BP: 130/75 121/83  Pulse: 71 78  Resp:  (!) 22  Temp:    SpO2:  100%    Intake/Output Summary (Last 24 hours) at 08/30/2024 1015 Last data filed at 08/30/2024 0450 Gross per 24 hour  Intake 1145 ml  Output --  Net 1145 ml   There were no vitals filed for this visit. There is no height or weight on file to calculate BMI.   Physical Exam: GENERAL: Patient is somnolent, barely responsive.  On restraints.  Obesity. HENT: No scleral pallor or icterus. Pupils equally reactive to light. Oral mucosa is moist NECK: is supple, no gross swelling noted. CHEST:  Diminished breath sounds bilaterally. CVS: S1 and S2 heard, no murmur. Regular rate and rhythm.  ABDOMEN: Soft, non-tender, bowel sounds are present. EXTREMITIES: No edema. CNS:  Somnolent, on restraints SKIN: warm and dry without rashes.  Data Review: I have personally reviewed the following laboratory data and studies,  CBC: Recent Labs  Lab 08/29/24 1819  WBC 11.3*  HGB 15.0  HCT 43.5  MCV 99.8  PLT 206   Basic Metabolic Panel: Recent Labs  Lab 08/29/24 1819 08/29/24 1833  NA  132*  --   K 3.6  --   CL 87*  --   CO2 24  --   GLUCOSE 122*  --   BUN <5*  --   CREATININE 0.89  --   CALCIUM  11.6*  --   MG  --  1.4*  PHOS  --  3.1   Liver Function Tests: Recent Labs  Lab 08/29/24 1819  AST 62*  ALT 27  ALKPHOS 64  BILITOT 1.5*  PROT 7.0  ALBUMIN 3.9   No results for input(s): LIPASE, AMYLASE in the last 168 hours. No results for input(s): AMMONIA in the last 168 hours. Cardiac Enzymes: No results for input(s): CKTOTAL, CKMB, CKMBINDEX, TROPONINI in the last 168 hours. BNP (last 3 results) No results for input(s): BNP in the last 8760 hours.  ProBNP (last 3 results) No results for input(s): PROBNP in the last 8760 hours.  CBG: No results for input(s): GLUCAP in the last 168 hours. No results found for this or any previous visit (from the past 240 hours).   Studies: DG Chest Portable 1 View Result Date: 08/30/2024 EXAM: 1 VIEW(S) XRAY OF THE CHEST 08/30/2024 09:07:51 AM COMPARISON: 06/11/2024 CLINICAL HISTORY: hypercalcemia FINDINGS: LUNGS AND PLEURA: No focal pulmonary opacity. No pulmonary edema. No pleural effusion. No pneumothorax. HEART AND MEDIASTINUM: Stable cardiomediastinal silhouette. BONES AND SOFT TISSUES: No acute osseous abnormality. IMPRESSION: 1. No acute cardiopulmonary process. Electronically signed by: Lynwood Seip MD 08/30/2024 09:30 AM EST RP Workstation: [id number]   CT Head Wo Contrast Result Date: 08/29/2024 EXAM: CT HEAD WITHOUT CONTRAST 08/29/2024 10:58:06 PM TECHNIQUE: CT of the head was performed without the administration of intravenous contrast. Automated exposure control, iterative reconstruction, and/or weight based adjustment of the mA/kV was utilized to reduce the radiation dose to as low as reasonably achievable. COMPARISON: 08/22/2024 CLINICAL HISTORY: ams FINDINGS: BRAIN AND VENTRICLES: No acute hemorrhage. No evidence of acute infarct. No hydrocephalus. No extra-axial collection. No mass effect or  midline shift. Global cortical atrophy. Subcortical and periventricular small vessel ischemic changes. ORBITS: No acute abnormality. SINUSES: No acute abnormality. SOFT TISSUES AND SKULL: No acute soft tissue abnormality. No skull fracture. IMPRESSION: 1. No acute intracranial abnormality. Electronically signed by: Pinkie Pebbles MD 08/29/2024 11:02 PM EST RP Workstation: [id number]      Vernal Alstrom, MD  [facility] 08/30/2024  If 7PM-7AM, please contact night-coverage

## 2024-08-30 NOTE — H&P (Signed)
 History and Physical    [name] FMW:[id number] DOB: 14-Aug-1956 DOA: 08/29/2024  PCP: Lenon Nell SAILOR, FNP   Patient coming from: Home  Chief Complaint: Seizure, confusion  HPI: [name] is a 68 y.o. female with medical history significant for alcohol  dependence, depression, paranoia, hypertension, seizures, and nonadherence with medications who presents with confusion and restlessness after a generalized seizure.  Patient had a brief, self-limited generalized seizure at home.  She was brought into the ED where she has remained encephalopathic.  She is unable to contribute to the history at all.  Per chart review, she had postictal right-sided weakness and aphasia previously.  ED Course: Upon arrival to the ED, patient is found to be afebrile and saturating well on room air with elevated heart rate and elevated blood pressure.  Labs are most notable for sodium 132, normal creatinine, magnesium  1.4, calcium  11.6, total bilirubin 1.5, WBC 11,300, normal hemoglobin, negative UDS, and undetectable ethanol.  Neurology was consulted by the ED physician and the patient was treated with a liter of saline, 2 g IV magnesium , 1 mg Ativan , 500 mg thiamine , and 1500 mg IV Keppra .  Review of Systems:  Unable to complete ROS secondary to the patient's clinical condition.  Past Medical History:  Diagnosis Date   Alcohol  dependence (HCC)    Anxiety    Chronic pain    Depression    Diverticulitis    Herniated cervical disc    Pancreatitis    Seizures (HCC)    alcoholic seizures    Past Surgical History:  Procedure Laterality Date   ABDOMINAL HYSTERECTOMY     ANKLE SURGERY Right    CARPAL TUNNEL RELEASE Bilateral    CERVICAL FUSION     CHOLECYSTECTOMY     KNEE SURGERY      Social History:   reports that she has been smoking cigarettes. She has a 1.8 pack-year smoking history. She has been exposed to tobacco smoke. She has never used smokeless tobacco. She  reports current alcohol  use. She reports that she does not currently use drugs after having used the following drugs: Marijuana.  Allergies  Allergen Reactions   Keflex  [Cephalexin ] Hives   Cephalosporins Hives   Prednisone  Swelling   Toradol  [Ketorolac  Tromethamine ] Hives   Ultram  [Tramadol ] Hives    Family History  Family history unknown: Yes     Prior to Admission medications   Medication Sig Start Date End Date Taking? Authorizing Provider  acetaminophen  (TYLENOL ) 500 MG tablet Take 1 tablet (500 mg total) by mouth every 6 (six) hours as needed for moderate pain (pain score 4-6). 04/01/24   Nanavati, Ankit, MD  cloBAZam  (ONFI ) 10 MG tablet Take 0.5 tablets (5 mg total) by mouth at bedtime. 06/15/24   Amin, Ankit C, MD  cyanocobalamin  1000 MCG tablet Take 1 tablet (1,000 mcg total) by mouth daily. 09/28/23   Singh, Prashant K, MD  diazePAM , 15 MG Dose, 2 x 7.5 MG/0.1ML LQPK Place 15 mg into the nose as needed. 06/15/24   Amin, Ankit C, MD  DULoxetine  (CYMBALTA ) 30 MG capsule Take 1 capsule (30 mg total) by mouth daily. 01/20/24 02/19/24  Vernon Ranks, MD  erythromycin  ophthalmic ointment Place a 1/2 inch ribbon of ointment into the lower eyelid. 04/01/24   Charlyn Sora, MD  folic acid  (FOLVITE ) 1 MG tablet Take 1 tablet (1 mg total) by mouth daily. 09/28/23   Dennise Lavada POUR, MD  gabapentin  (NEURONTIN ) 300 MG capsule Take 1 capsule (300 mg total)  by mouth 3 (three) times daily. 10/23/23   Haviland, Julie, MD  levETIRAcetam  (KEPPRA  XR) 500 MG 24 hr tablet Take 2 tablets (1,000 mg total) by mouth at bedtime. 06/15/24 07/15/24  Caleen Burgess BROCKS, MD  menthol -cetylpyridinium (CEPACOL) 3 MG lozenge Take 1 lozenge (3 mg total) by mouth as needed for sore throat. 09/28/23   Singh, Prashant K, MD  methocarbamol  (ROBAXIN ) 500 MG tablet Take 1 tablet (500 mg total) by mouth 2 (two) times daily. 10/23/23   Dean Clarity, MD  pantoprazole  (PROTONIX ) 40 MG tablet Take 1 tablet (40 mg total) by mouth daily.  01/20/24 03/20/24  Vernon Ranks, MD  polyethylene glycol powder (GLYCOLAX /MIRALAX ) 17 GM/SCOOP powder Take 17 g by mouth daily as needed for moderate constipation. 06/15/24   Amin, Ankit C, MD  potassium chloride  SA (KLOR-CON  M) 20 MEQ tablet Take 1 tablet (20 mEq total) by mouth 2 (two) times daily. 08/22/24   Mannie Pac T, DO  risperiDONE  (RISPERDAL ) 0.5 MG tablet Take 1 tablet (0.5 mg total) by mouth 2 (two) times daily. 01/20/24 02/19/24  Vernon Ranks, MD  thiamine  (VITAMIN B1) 100 MG tablet Take 1 tablet (100 mg total) by mouth daily. 09/28/23   Singh, Prashant K, MD  metoCLOPramide  (REGLAN ) 10 MG tablet Take 1 tablet (10 mg total) by mouth every 6 (six) hours as needed for nausea (nausea/headache). 04/06/19 05/24/19  Mesner, Selinda, MD    Physical Exam: Vitals:   08/30/24 0100 08/30/24 0130 08/30/24 0145 08/30/24 0200  BP: 134/71 131/66 136/69 101/71  Pulse: 87 78 91 95  Resp: (!) 21 19 18  (!) 25  Temp:      TempSrc:      SpO2: 99% 98% 94% 100%    Constitutional: NAD, no pallor or diaphoresis   Eyes: PERTLA, lids and conjunctivae normal ENMT: Mucous membranes are moist. Posterior pharynx clear of any exudate or lesions.   Neck: supple, no masses  Respiratory: No wheezing, no crackles. No accessory muscle use.  Cardiovascular: S1 & S2 heard, regular rate and rhythm. Mild lower extremity edema.  Abdomen: No tenderness, soft. Bowel sounds active.  Musculoskeletal: no clubbing / cyanosis. No joint deformity upper and lower extremities.   Skin: no significant rashes, lesions, ulcers. Warm, dry, well-perfused. Neurologic: CN 2-12 grossly intact. Moving all extremities. Wakes to voice but does not provide intelligible answer to simple questions.     Labs and Imaging on Admission: I have personally reviewed following labs and imaging studies  CBC: Recent Labs  Lab 08/29/24 1819  WBC 11.3*  HGB 15.0  HCT 43.5  MCV 99.8  PLT 206   Basic Metabolic Panel: Recent Labs  Lab  08/29/24 1819 08/29/24 1833  NA 132*  --   K 3.6  --   CL 87*  --   CO2 24  --   GLUCOSE 122*  --   BUN <5*  --   CREATININE 0.89  --   CALCIUM  11.6*  --   MG  --  1.4*  PHOS  --  3.1   GFR: CrCl cannot be calculated (Unknown ideal weight.). Liver Function Tests: Recent Labs  Lab 08/29/24 1819  AST 62*  ALT 27  ALKPHOS 64  BILITOT 1.5*  PROT 7.0  ALBUMIN 3.9   No results for input(s): LIPASE, AMYLASE in the last 168 hours. No results for input(s): AMMONIA in the last 168 hours. Coagulation Profile: No results for input(s): INR, PROTIME in the last 168 hours. Cardiac Enzymes: No results for input(s):  CKTOTAL, CKMB, CKMBINDEX, TROPONINI in the last 168 hours. BNP (last 3 results) No results for input(s): PROBNP in the last 8760 hours. HbA1C: No results for input(s): HGBA1C in the last 72 hours. CBG: No results for input(s): GLUCAP in the last 168 hours. Lipid Profile: No results for input(s): CHOL, HDL, LDLCALC, TRIG, CHOLHDL, LDLDIRECT in the last 72 hours. Thyroid  Function Tests: No results for input(s): TSH, T4TOTAL, FREET4, T3FREE, THYROIDAB in the last 72 hours. Anemia Panel: Recent Labs    08/29/24 2230  VITAMINB12 615   Urine analysis:    Component Value Date/Time   COLORURINE YELLOW 08/29/2024 2230   APPEARANCEUR CLEAR 08/29/2024 2230   LABSPEC 1.012 08/29/2024 2230   PHURINE 7.0 08/29/2024 2230   GLUCOSEU NEGATIVE 08/29/2024 2230   HGBUR SMALL (A) 08/29/2024 2230   BILIRUBINUR NEGATIVE 08/29/2024 2230   KETONESUR NEGATIVE 08/29/2024 2230   PROTEINUR 100 (A) 08/29/2024 2230   UROBILINOGEN 0.2 07/14/2019 1451   NITRITE NEGATIVE 08/29/2024 2230   LEUKOCYTESUR NEGATIVE 08/29/2024 2230   Sepsis Labs: @LABRCNTIP (procalcitonin:4,lacticidven:4) )No results found for this or any previous visit (from the past 240 hours).   Radiological Exams on Admission: CT Head Wo Contrast Result Date:  08/29/2024 EXAM: CT HEAD WITHOUT CONTRAST 08/29/2024 10:58:06 PM TECHNIQUE: CT of the head was performed without the administration of intravenous contrast. Automated exposure control, iterative reconstruction, and/or weight based adjustment of the mA/kV was utilized to reduce the radiation dose to as low as reasonably achievable. COMPARISON: 08/22/2024 CLINICAL HISTORY: ams FINDINGS: BRAIN AND VENTRICLES: No acute hemorrhage. No evidence of acute infarct. No hydrocephalus. No extra-axial collection. No mass effect or midline shift. Global cortical atrophy. Subcortical and periventricular small vessel ischemic changes. ORBITS: No acute abnormality. SINUSES: No acute abnormality. SOFT TISSUES AND SKULL: No acute soft tissue abnormality. No skull fracture. IMPRESSION: 1. No acute intracranial abnormality. Electronically signed by: Pinkie Pebbles MD 08/29/2024 11:02 PM EST RP Workstation: [id number]    EKG: Independently reviewed. Sinus tachycardia, rate 103, LAFB.   Assessment/Plan   1. Acute encephalopathy; seizures  - Patient with hx of seizures had self-limited generalized seizure witnessed by EMS but has remained encephalopathic  - No acute findings on head CT; B12 is normal in ED  - She was given 1500 mg IV Keppra  in ED  - Appreciate neurology consultation, planning for continuous EEG, continue Keppra  1000 mg BID, and IV Vimpat  until she is alert enough to resume Onfi  0.5 mg at bedtime and gabapentin  300 mg TID    2. Depression, anxiety, paranoia  - Continue Risperdal , Cymbalta     3. Hx of alcohol  dependence   - Monitor with CIWA, use Ativan  if needed, supplement vitamins    4. Hypomagnesemia  - Replacing, will repeat chemistries in am   5. Hypercalcemia  - Continue IVF hydration, check PTH     DVT prophylaxis: Lovenox   Code Status: Full  Level of Care: Level of care: Progressive Family Communication: Unable to reach her listed contacts Disposition Plan:  Patient is from: Home   Anticipated d/c is to: TBD Anticipated d/c date is: TBD Patient currently: Pending EEG, improved mental status  Consults called: Neurology  Admission status: Observation     Evalene GORMAN Sprinkles, MD [facility]  08/30/2024, 4:12 AM

## 2024-08-30 NOTE — Progress Notes (Signed)
 STAT LTM EEG hooked up and running - no initial skin breakdown - push button tested - Unmonitored [facility] monitoring. CT leads used

## 2024-08-30 NOTE — Progress Notes (Addendum)
 Subjective: No further seizures overnight.Waking up, able to tell me her name but did not answer other questions today.  ROS: Unable to obtain due to poor mental status  Examination  Vital signs in last 24 hours: Temp:  [98.5 F (36.9 C)-99.8 F (37.7 C)] 98.5 F (36.9 C) (11/12 1500) Pulse Rate:  [71-123] 73 (11/12 1215) Resp:  [18-25] 22 (11/12 1215) BP: (101-173)/(53-97) 152/86 (11/12 1500) SpO2:  [93 %-100 %] 100 % (11/12 1215)  General: lying in bed, NAD Neuro: Opens eyes to repeated tactile stimulation, able to tell me her name, did not answer other mentation questions, did not follow commands, however pupils appear equally round and reactive, able to track examiner in room, blinks to threat, no apparent facial asymmetry, spontaneously moving all 4 extremities in bed  Basic Metabolic Panel: Recent Labs  Lab 08/29/24 1819 08/29/24 1833  NA 132*  --   K 3.6  --   CL 87*  --   CO2 24  --   GLUCOSE 122*  --   BUN <5*  --   CREATININE 0.89  --   CALCIUM  11.6*  --   MG  --  1.4*  PHOS  --  3.1    CBC: Recent Labs  Lab 08/29/24 1819  WBC 11.3*  HGB 15.0  HCT 43.5  MCV 99.8  PLT 206     Coagulation Studies: No results for input(s): LABPROT, INR in the last 72 hours.  Imaging personally reviewed  CT head without contrast 08/29/2024: No acute abnormality.   ASSESSMENT AND PLAN: 68 year old female who is well-known to neurology team due to history of seizures and repeated breakthrough seizures in the setting of medication noncompliance.  She again presented with breakthrough seizures.  Epilepsy with breakthrough seizure - Continuous EEG hooked up this morning and reviewed, no seizures.  Will await final report tomorrow morning - In the interim, please continue Keppra  1000 mg twice daily  - Will resume Onfi  5 mg nightly and gabapentin  milligrams 3 times daily.  However patient may not be able to receive this if she continues to be altered - Also on CIWA  protocol and thiamine  - Rest of the management per primary team - Plan communicated plan with Dr. Sonjia via secure chat    I personally spent a total of 38 minutes in the care of the patient today including getting/reviewing separately obtained history, performing a medically appropriate exam/evaluation, counseling and educating, placing orders, referring and communicating with other health care professionals, documenting clinical information in the EHR, independently interpreting results, and coordinating care.         Arlin Krebs Epilepsy [facility] For questions after 5pm please refer to AMION to reach the Neurologist on call

## 2024-08-30 NOTE — ED Notes (Signed)
 Called EEG x3 with no answer. Voicemail left.

## 2024-08-30 NOTE — ED Notes (Addendum)
 CBC, CMP, and ammonia hemolyzed per lab. Multiple attempts for bloodwork by RN's and phlebotomist with no success. Spoke with MD - holding off on lab work at this time. Will reassess need for different access with day team.

## 2024-08-30 NOTE — ED Notes (Signed)
 Called EEG for overnight monitoring but received no answer.

## 2024-08-30 NOTE — Progress Notes (Signed)
 LTM EEG transferred [id number]. Machine was not transported with pt, was left outside in the ED. EEG team no notifued from ED or 5W of the transfer from ED or arrival to 5W.   [facility] now monitoring this patient.

## 2024-08-31 ENCOUNTER — Inpatient Hospital Stay (HOSPITAL_COMMUNITY)

## 2024-08-31 DIAGNOSIS — G40909 Epilepsy, unspecified, not intractable, without status epilepticus: Secondary | ICD-10-CM | POA: Diagnosis not present

## 2024-08-31 DIAGNOSIS — R569 Unspecified convulsions: Secondary | ICD-10-CM | POA: Diagnosis not present

## 2024-08-31 DIAGNOSIS — G9340 Encephalopathy, unspecified: Secondary | ICD-10-CM | POA: Diagnosis not present

## 2024-08-31 DIAGNOSIS — F411 Generalized anxiety disorder: Secondary | ICD-10-CM | POA: Diagnosis not present

## 2024-08-31 DIAGNOSIS — F1024 Alcohol dependence with alcohol-induced mood disorder: Secondary | ICD-10-CM | POA: Diagnosis not present

## 2024-08-31 LAB — BASIC METABOLIC PANEL WITH GFR
Anion gap: 7 (ref 5–15)
BUN: 6 mg/dL — ABNORMAL LOW (ref 8–23)
CO2: 29 mmol/L (ref 22–32)
Calcium: 8.8 mg/dL — ABNORMAL LOW (ref 8.9–10.3)
Chloride: 101 mmol/L (ref 98–111)
Creatinine, Ser: 0.77 mg/dL (ref 0.44–1.00)
GFR, Estimated: >60 mL/min (ref >60)
Glucose, Bld: 106 mg/dL — ABNORMAL HIGH (ref 70–99)
Potassium: 2.7 mmol/L — CL (ref 3.5–5.1)
Sodium: 137 mmol/L (ref 135–145)

## 2024-08-31 LAB — COMPREHENSIVE METABOLIC PANEL WITH GFR
ALT: 19 U/L (ref 0–44)
AST: 38 U/L (ref 15–41)
Albumin: 3.1 g/dL — ABNORMAL LOW (ref 3.5–5.0)
Alkaline Phosphatase: 45 U/L (ref 38–126)
Anion gap: 9 (ref 5–15)
BUN: 6 mg/dL — ABNORMAL LOW (ref 8–23)
CO2: 29 mmol/L (ref 22–32)
Calcium: 9.2 mg/dL (ref 8.9–10.3)
Chloride: 99 mmol/L (ref 98–111)
Creatinine, Ser: 0.66 mg/dL (ref 0.44–1.00)
GFR, Estimated: >60 mL/min (ref >60)
Glucose, Bld: 85 mg/dL (ref 70–99)
Potassium: 2.4 mmol/L — CL (ref 3.5–5.1)
Sodium: 137 mmol/L (ref 135–145)
Total Bilirubin: 1.1 mg/dL (ref 0.0–1.2)
Total Protein: 5.7 g/dL — ABNORMAL LOW (ref 6.5–8.1)

## 2024-08-31 LAB — CBC
HCT: 39.2 % (ref 36.0–46.0)
Hemoglobin: 13.4 g/dL (ref 12.0–15.0)
MCH: 34.3 pg — ABNORMAL HIGH (ref 26.0–34.0)
MCHC: 34.2 g/dL (ref 30.0–36.0)
MCV: 100.3 fL — ABNORMAL HIGH (ref 80.0–100.0)
Platelets: 147 K/uL — ABNORMAL LOW (ref 150–400)
RBC: 3.91 MIL/uL (ref 3.87–5.11)
RDW: 14.6 % (ref 11.5–15.5)
WBC: 8.8 K/uL (ref 4.0–10.5)
nRBC: 0 % (ref 0.0–0.2)

## 2024-08-31 LAB — MAGNESIUM: Magnesium: 1.5 mg/dL — ABNORMAL LOW (ref 1.7–2.4)

## 2024-08-31 LAB — AMMONIA: Ammonia: 29 umol/L (ref 9–35)

## 2024-08-31 MED ORDER — MAGNESIUM SULFATE 4 GM/100ML IV SOLN
INTRAVENOUS | Status: AC
  Administered 2024-08-31: 4 g via INTRAVENOUS
  Filled 2024-08-31: qty 100

## 2024-08-31 MED ORDER — MAGNESIUM SULFATE 4 GM/100ML IV SOLN
4.0000 g | Freq: Once | INTRAVENOUS | Status: AC
  Filled 2024-08-31: qty 100

## 2024-08-31 MED ORDER — POTASSIUM CHLORIDE CRYS ER 20 MEQ PO TBCR
40.0000 meq | EXTENDED_RELEASE_TABLET | Freq: Once | ORAL | Status: AC
  Administered 2024-08-31: 40 meq via ORAL
  Filled 2024-08-31: qty 2

## 2024-08-31 MED ORDER — POTASSIUM CHLORIDE 10 MEQ/100ML IV SOLN
10.0000 meq | INTRAVENOUS | Status: AC
  Administered 2024-08-31 (×2): 10 meq via INTRAVENOUS
  Filled 2024-08-31 (×3): qty 100

## 2024-08-31 MED ORDER — POTASSIUM CHLORIDE 10 MEQ/100ML IV SOLN
10.0000 meq | INTRAVENOUS | Status: AC
  Administered 2024-08-31 (×3): 10 meq via INTRAVENOUS
  Filled 2024-08-31 (×3): qty 100

## 2024-08-31 MED ORDER — POTASSIUM CHLORIDE CRYS ER 20 MEQ PO TBCR
20.0000 meq | EXTENDED_RELEASE_TABLET | Freq: Once | ORAL | Status: AC
  Administered 2024-08-31: 20 meq via ORAL
  Filled 2024-08-31: qty 1

## 2024-08-31 MED ORDER — POTASSIUM CHLORIDE 10 MEQ/100ML IV SOLN
10.0000 meq | INTRAVENOUS | Status: AC
  Administered 2024-08-31 – 2024-09-01 (×4): 10 meq via INTRAVENOUS
  Filled 2024-08-31 (×4): qty 100

## 2024-08-31 MED ORDER — MAGNESIUM SULFATE IN D5W 1-5 GM/100ML-% IV SOLN
1.0000 g | Freq: Once | INTRAVENOUS | Status: DC
  Filled 2024-08-31: qty 100

## 2024-08-31 NOTE — Procedures (Signed)
 Patient Name: [name]  MRN: [id number]  Epilepsy Attending: Arlin MALVA Krebs  Referring Physician/Provider: Khaliqdina, Salman, MD  Duration: 08/31/2024  9161 to 08/31/2024 1053   Patient history: 68 year old female with history of seizures and repeated breakthrough seizures in the setting of medication noncompliance. EEG to evaluate for seizure   Level of alertness: Awake, asleep   AEDs during EEG study: Onfi , GBP, LEV, ativan    Technical aspects: This EEG study was done with scalp electrodes positioned according to the 10-20 International system of electrode placement. Electrical activity was reviewed with band pass filter of 1-70Hz , sensitivity of 7 uV/mm, display speed of 52mm/sec with a 60Hz  notched filter applied as appropriate. EEG data were recorded continuously and digitally stored.  Video monitoring was available and reviewed as appropriate.   Description: No clear posterior dominant rhythm was seen. Sleep was characterized by vertex waves, sleep spindles (12 to 14 Hz), maximal frontocentral region. EEG showed continuous generalized and maximal left posterior quadrant 3 to 6 Hz theta-delta slowing. There is an excessive amount of 15 to 18 Hz beta activity distributed symmetrically and diffusely. Hyperventilation and photic stimulation were not performed.      ABNORMALITY - Continuous slow, generalized generalized and maximal left posterior quadrant - Excessive beta, generalized   IMPRESSION: This study is suggestive of cortical dysfunction arising from left posterior temporal region likely secondary to underlying structural abnormality. Additionally there is generalize cerebral dysfunction (encephalopathy). The excessive beta activity seen in the background is most likely due to the effect of benzodiazepine and is a benign EEG pattern. No seizures or epileptiform discharges were seen throughout the recording.   Darryl Blumenstein O Shanaia Sievers

## 2024-08-31 NOTE — Progress Notes (Signed)
 PROGRESS NOTE        PATIENT DETAILS Name: [name] Age: 68 y.o. Sex: female Date of Birth: [birth year] Admit Date: 08/29/2024 Admitting Physician Evalene GORMAN Sprinkles, MD ERE:[email or web address], Nell SAILOR, FNP  Brief Summary: Patient is a 68 y.o. female  with medical history significant for alcohol  dependence, depression, paranoia, hypertension, seizures, and nonadherence with medications presented to hospital with confusion and restlessness after having a generalized self-limited seizure at home and subsequently remained encephalopathic.  In the ED patient had tachycardia and hypotension.  Labs were notable for hyponatremia with magnesium  of 1.4 and calcium  of 11.6 with mild leukocytosis at 11 300.  Urine drug screen was negative including alcohol  levels.  Neurology was consulted and patient was given 2 g of IV magnesium  sulfate 1 mg of Ativan  and 500 mg of thiamine  in IV Keppra  1500 mg.  Patient was then admitted hospital for further evaluation and treatment.    Significant events: 11/11>> admitted seizures and prolonged post-ictal period   Significant studies: EEG>> cortical dysfunction from left posterior temporal region. generalize cerebral dysfunction (encephalopathy). No seizures or epileptiform discharges   Significant microbiology data:   Procedures:   Consults: None  Subjective: Laying in bed, sleeping and difficult to awaken. Alert when awake and aware of situation. No new or worsening concerns. Feels nearly back to her normal self but is not oriented to time.   Objective: Vitals: Blood pressure (!) 142/89, pulse 77, temperature 97.6 F (36.4 C), temperature source Axillary, resp. rate 20, height 5' 1 (1.549 m), weight 66.8 kg, SpO2 100%.   Exam: Constitutional:Tired appearing elderly female laying in bed. In no acute distress. HENT: Normocephalic, atraumatic,  Eyes: Sclera non-icteric, PERRL, EOM intact Cardio:Regular rate and rhythm. 2+  bilateral radial and dorsalis pedis  pulses. Pulm:Clear to auscultation bilaterally in the anterior fields. Normal work of breathing on room air. Abdomen: Soft, non-tender, non-distended, positive bowel sounds. FDX:Wzhjupcz for extremity edema. Skin:Warm and dry. Neuro:Alert and oriented to person, place, and situation but not time. Moves all 4 extremities against gravity.  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 Improved with treatment of seizures, still not at baseline, not oriented to time. Remains on LTM Received 1500 mg of care IV Keppra  in the ED.  CT head scan was negative for acute findings.  Vitamin B12 normal.  Neurology on board.  RPR was nonreactive.  TSH within normal range.  Urine drug screen was negative urinalysis without signs of infection.  Continue supportive care and seizure meds as below   Generalized seizures.   Continue Keppra  and IV Vimpat .  Resumed onfi  and gabapentin , neurology following, no seizures on initial EEG, remains on LTM.    History of alcohol  dependence.   EtOH level negative on admission. On CIWA protocol.  Watch closely for withdrawal.  IV thiamine    Hypomagnesemia Hypokalemia K 2.5 and Mg 1.5, additional replacement today. Recheck in AM    Hypercalcemia.   Resolved after IVF. PTH pending   Mild hyponatremia.   Resolved.  Code status:   Code Status: Full Code   DVT Prophylaxis: enoxaparin  (LOVENOX ) injection 40 mg Start: 08/30/24 1400     Family Communication: None at bedside   Disposition Plan: Status is: Inpatient Remains inpatient appropriate because: severity of disease   Planned Discharge Destination:Home vs SNF, pending therapy recs   Diet: Diet Order             Diet regular Room service appropriate? Yes; Fluid consistency: Thin  Diet effective now                     Antimicrobial agents: Anti-infectives (From admission, onward)    None        MEDICATIONS: Scheduled Meds:  cloBAZam   5 mg Oral QHS   DULoxetine   30 mg Oral Daily   enoxaparin  (LOVENOX ) injection  40 mg Subcutaneous Q24H   folic acid   1 mg Oral Daily   gabapentin   300 mg Oral TID   levETIRAcetam   1,000 mg Intravenous Q12H   LORazepam   0-4 mg Intravenous Q4H   Followed by   NOREEN ON 09/01/2024] LORazepam   0-4 mg Intravenous Q8H   multivitamin with minerals  1 tablet Oral Daily   pantoprazole   40 mg Oral Daily   risperiDONE   0.5 mg Oral BID   sodium chloride  flush  3 mL Intravenous Q12H   Continuous Infusions:  magnesium  sulfate bolus IVPB     potassium chloride      thiamine  (VITAMIN B1) injection 500 mg (08/30/24 2304)   PRN Meds:.acetaminophen  **OR** acetaminophen , LORazepam  **OR** LORazepam , prochlorperazine, senna-docusate   I have personally reviewed following labs and imaging studies  LABORATORY DATA: CBC: Recent Labs  Lab 08/29/24 1819 08/30/24 2140 08/31/24 0208  WBC 11.3* 8.0 8.8  HGB 15.0 13.2 13.4  HCT 43.5 38.6 39.2  MCV 99.8 99.5 100.3*  PLT 206 179 147*    Basic Metabolic Panel: Recent Labs  Lab 08/29/24 1819 08/29/24 1833 08/30/24 2122 08/31/24 0208  NA 132*  --  140 137  K 3.6  --  3.2* 2.4*  CL 87*  --  97* 99  CO2 24  --  27 29  GLUCOSE 122*  --  62* 85  BUN <5*  --   6* 6*  CREATININE 0.89  --  0.72 0.66  CALCIUM  11.6*  --  9.5 9.2  MG  --  1.4* 1.7 1.5*  PHOS  --  3.1  --   --     GFR: Estimated Creatinine Clearance: 58.9 mL/min (by C-G formula based on SCr of 0.66 mg/dL).  Liver Function Tests: Recent Labs  Lab 08/29/24 1819 08/30/24 2122 08/31/24 0208  AST 62* 50* 38  ALT 27 22 19   ALKPHOS 64 56 45  BILITOT 1.5* 1.7* 1.1  PROT 7.0 6.6 5.7*  ALBUMIN 3.9 3.7 3.1*   No results for input(s): LIPASE, AMYLASE in the last 168 hours. Recent Labs  Lab 08/31/24 0208  AMMONIA 29  Coagulation Profile: No results for input(s): INR, PROTIME in the last 168 hours.  Cardiac Enzymes: No results for input(s): CKTOTAL, CKMB, CKMBINDEX, TROPONINI in the last 168 hours.  BNP (last 3 results) No results for input(s): PROBNP in the last 8760 hours.  Lipid Profile: No results for input(s): CHOL, HDL, LDLCALC, TRIG, CHOLHDL, LDLDIRECT in the last 72 hours.  Thyroid  Function Tests: Recent Labs    08/30/24 0320  TSH 1.124    Anemia Panel: Recent Labs    08/29/24 2230 08/30/24 2122  VITAMINB12 615  --   FOLATE  --  12.7    Urine analysis:    Component Value Date/Time   COLORURINE YELLOW 08/29/2024 2230   APPEARANCEUR CLEAR 08/29/2024 2230   LABSPEC 1.012 08/29/2024 2230   PHURINE 7.0 08/29/2024 2230   GLUCOSEU NEGATIVE 08/29/2024 2230   HGBUR SMALL (A) 08/29/2024 2230   BILIRUBINUR NEGATIVE 08/29/2024 2230   KETONESUR NEGATIVE 08/29/2024 2230   PROTEINUR 100 (A) 08/29/2024 2230   UROBILINOGEN 0.2 07/14/2019 1451   NITRITE NEGATIVE 08/29/2024 2230   LEUKOCYTESUR NEGATIVE 08/29/2024 2230    Sepsis Labs: Lactic Acid, Venous    Component Value Date/Time   LATICACIDVEN 2.7 (HH) 06/11/2024 1205    MICROBIOLOGY: No results found for this or any previous visit (from the past 240 hours).  RADIOLOGY STUDIES/RESULTS: Overnight EEG with video Result Date: 08/31/2024 Shelton Arlin KIDD, MD      08/31/2024  7:27 AM Patient Name: Fareedah Mahler MRN: [id number] Epilepsy Attending: Arlin KIDD Shelton Referring Physician/Provider: Khaliqdina, Salman, MD Duration: 08/30/2024  0838 to 08/31/2024 0730 Patient history: 68 year old female with history of seizures and repeated breakthrough seizures in the setting of medication noncompliance. EEG to evaluate for seizure Level of alertness: Awake, asleep AEDs during EEG study: Onfi , GBP, LEV, ativan  Technical aspects: This EEG study was done with scalp electrodes positioned according to the 10-20 International system of electrode placement. Electrical activity was reviewed with band pass filter of 1-70Hz , sensitivity of 7 uV/mm, display speed of 65mm/sec with a 60Hz  notched filter applied as appropriate. EEG data were recorded continuously and digitally stored.  Video monitoring was available and reviewed as appropriate. Description: No clear posterior dominant rhythm was seen. Sleep was characterized by vertex waves, sleep spindles (12 to 14 Hz), maximal frontocentral region. EEG showed continuous generalized and maximal left posterior quadrant 3 to 6 Hz theta-delta slowing. There is an excessive amount of 15 to 18 Hz beta activity distributed symmetrically and diffusely. Hyperventilation and photic stimulation were not performed.   ABNORMALITY - Continuous slow, generalized generalized and maximal left posterior quadrant - Excessive beta, generalized IMPRESSION: This study is suggestive of cortical dysfunction arising from left posterior temporal region likely secondary to underlying structural abnormality. Additionally there is generalize cerebral dysfunction (encephalopathy). The excessive beta activity seen in the background is most likely due to the effect of benzodiazepine and is a benign EEG pattern. No seizures or epileptiform discharges were seen throughout the recording. Arlin KIDD Shelton   DG Chest Portable 1 View Result Date: 08/30/2024 EXAM: 1  VIEW(S) XRAY OF THE CHEST 08/30/2024 09:07:51 AM COMPARISON: 06/11/2024 CLINICAL HISTORY: hypercalcemia FINDINGS: LUNGS AND PLEURA: No focal pulmonary opacity. No pulmonary edema. No pleural effusion. No pneumothorax. HEART AND MEDIASTINUM: Stable cardiomediastinal silhouette. BONES AND SOFT TISSUES: No acute osseous abnormality. IMPRESSION: 1. No acute cardiopulmonary process. Electronically signed by: Lynwood Seip MD 08/30/2024 09:30 AM EST RP Workstation: [id number]   CT Head Wo Contrast Result Date: 08/29/2024 EXAM: CT HEAD  WITHOUT CONTRAST 08/29/2024 10:58:06 PM TECHNIQUE: CT of the head was performed without the administration of intravenous contrast. Automated exposure control, iterative reconstruction, and/or weight based adjustment of the mA/kV was utilized to reduce the radiation dose to as low as reasonably achievable. COMPARISON: 08/22/2024 CLINICAL HISTORY: ams FINDINGS: BRAIN AND VENTRICLES: No acute hemorrhage. No evidence of acute infarct. No hydrocephalus. No extra-axial collection. No mass effect or midline shift. Global cortical atrophy. Subcortical and periventricular small vessel ischemic changes. ORBITS: No acute abnormality. SINUSES: No acute abnormality. SOFT TISSUES AND SKULL: No acute soft tissue abnormality. No skull fracture. IMPRESSION: 1. No acute intracranial abnormality. Electronically signed by: Pinkie Pebbles MD 08/29/2024 11:02 PM EST RP Workstation: [id number]     LOS: 1 day   Fairy Pool, DO Internal Medicine Resident, PGY-3 7:56 AM 08/31/2024    To contact the attending provider between 7A-7P or the covering provider during after hours 7P-7A, please log into the web site [email or web address] and access using universal [place] password for that web site. If you do not have the password, please call the hospital operator.  08/31/2024, 7:56 AM

## 2024-08-31 NOTE — Progress Notes (Signed)
 LTM VIDEO EEG discontinued - no skin breakdown at [facility].

## 2024-08-31 NOTE — Evaluation (Addendum)
 Physical Therapy Evaluation Patient Details Name: [name] MRN: [id number] DOB: Jun 07, 1956 Today's Date: 08/31/2024  History of Present Illness  Pt is a 68 y.o. female who presented 08/29/24 with confusion and restlessness after a generalizes seizure and subsequently remained encephalopathic. PMH: Seizure disorder, ETOH withdrawal, pancreatitis, anxiety, depression and SAH.   Clinical Impression  Pt presents with condition above and deficits mentioned below, see PT Problem List. The pt is currently disoriented to date, location, and situation. She is likely a poor historian, but she reports at baseline she is mod I with a SPC and she lives with her boyfriend in a 1-level apartment with a level entry. Currently, the pt displays deficits in cognition, balance, coordination, strength, and activity tolerance. She is currently requiring up to [facility] for bed mobility, transfers, and ambulating up to ~6 ft with bil HHA. She was limited in advancing gait due to her HR elevating up to 141 bpm when standing this date. The pt is at high risk for falls and has had a functional decline and thereby could benefit from short-term inpatient rehab, < 3 hours/day. Will continue to follow acutely.      If plan is discharge home, recommend the following: A lot of help with walking and/or transfers;A lot of help with bathing/dressing/bathroom;Assistance with cooking/housework;Direct supervision/assist for medications management;Direct supervision/assist for financial management;Help with stairs or ramp for entrance;Assist for transportation;Supervision due to cognitive status   Can travel by private vehicle   Yes    Equipment Recommendations Rolling walker (2 wheels);BSC/3in1 (pending progress)  Recommendations for Other Services       Functional Status Assessment Patient has had a recent decline in their functional status and demonstrates the ability to make significant improvements in function in a  reasonable and predictable amount of time.     Precautions / Restrictions Precautions Precautions: Fall;Other (comment) Recall of Precautions/Restrictions: Impaired Precaution/Restrictions Comments: seizures; watch HR Restrictions Weight Bearing Restrictions Per Provider Order: No      Mobility  Bed Mobility Overal bed mobility: Needs Assistance Bed Mobility: Sit to Supine, Supine to Sit     Supine to sit: Mod assist, HOB elevated Sit to supine: Min assist   General bed mobility comments: Cues needed to manage each leg off R EOB one at a time and ascend trunk, modA. MinA to return to supine    Transfers Overall transfer level: Needs assistance Equipment used: 1 person hand held assist Transfers: Sit to/from Stand Sit to Stand: Min assist, Mod assist           General transfer comment: Pt stood from EOB 2x, modA the first rep and minA the second, needing assistance to initiate power up to stand and gain balance    Ambulation/Gait Ambulation/Gait assistance: Min assist, Mod assist Gait Distance (Feet): 6 Feet (x2 bouts of ~6 ft > ~2 ft) Assistive device: 1 person hand held assist Gait Pattern/deviations: Step-to pattern, Decreased step length - right, Decreased step length - left, Decreased stride length, Staggering right, Staggering left Gait velocity: reduced Gait velocity interpretation: <1.31 ft/sec, indicative of household ambulator   General Gait Details: Pt with slow, unsteady, staggering steps around EOB and anterior <> posterior at EOB. Pt staggering and needing min-modA with bil HHA to maintain her balance and safety. Distance limited by elevated HR up to 141.  Stairs            Wheelchair Mobility     Tilt Bed    Modified Rankin (Stroke Patients Only)  Balance Overall balance assessment: Needs assistance Sitting-balance support: No upper extremity supported, Feet supported Sitting balance-Leahy Scale: Fair Sitting balance - Comments:  static sitting EOB with CGA for safety   Standing balance support: Bilateral upper extremity supported, During functional activity, Reliant on assistive device for balance Standing balance-Leahy Scale: Poor Standing balance comment: reliant on bil HHA and min-modA                             Pertinent Vitals/Pain Pain Assessment Pain Assessment: 0-10 Pain Score: 9  Pain Location: all over Pain Descriptors / Indicators: Discomfort, Aching Pain Intervention(s): Limited activity within patient's tolerance, Monitored during session, Repositioned, Patient requesting pain meds-RN notified    Home Living Family/patient expects to be discharged to:: Private residence Living Arrangements: Spouse/significant other (boyfriend) Available Help at Discharge: Family;Available 24 hours/day Type of Home: Apartment Home Access: Level entry       Home Layout: One level Home Equipment: Cane - single point Additional Comments: unsure of reliability of info as pt is disoriented currently and no one present to confirm, recent admission reported pt was homeless    Prior Function Prior Level of Function : Independent/Modified Independent             Mobility Comments: Uses SPC mod I       Extremity/Trunk Assessment   Upper Extremity Assessment Upper Extremity Assessment: Defer to OT evaluation    Lower Extremity Assessment Lower Extremity Assessment: Generalized weakness (grossly 3+ to 4- MMT bil; denied numbness/tingling bil)    Cervical / Trunk Assessment Cervical / Trunk Assessment: Normal  Communication   Communication Communication: Impaired Factors Affecting Communication: Other (comment) (soft spoken)    Cognition Arousal: Lethargic Behavior During Therapy: Flat affect   PT - Cognitive impairments: Orientation, Awareness, Memory, Attention, Initiation, Sequencing, Problem solving, Safety/Judgement   Orientation impairments: Place, Time, Situation                    PT - Cognition Comments: Pt thought it was March and she was at home, was unaware of seizure. Pt slow to process cues and follow simple commands. Poor awareness of her deficits and safety. Lethargic when supine, needing a lot of stimulation to arouse, remained awake when sitting/standing. Following commands: Impaired Following commands impaired: Follows one step commands with increased time     Cueing Cueing Techniques: Verbal cues, Tactile cues     General Comments General comments (skin integrity, edema, etc.): HR up to 141 bpm with standing mobility, recovered quickly to 120s once sitting EOB    Exercises     Assessment/Plan    PT Assessment Patient needs continued PT services  PT Problem List Decreased strength;Decreased activity tolerance;Decreased balance;Decreased mobility;Decreased cognition;Decreased safety awareness;Cardiopulmonary status limiting activity       PT Treatment Interventions DME instruction;Gait training;Functional mobility training;Therapeutic activities;Therapeutic exercise;Balance training;Neuromuscular re-education;Cognitive remediation;Patient/family education;Stair training    PT Goals (Current goals can be found in the Care Plan section)  Acute Rehab PT Goals Patient Stated Goal: to reduce her pain PT Goal Formulation: With patient Time For Goal Achievement: 09/14/24 Potential to Achieve Goals: Good    Frequency Min 3X/week     Co-evaluation               AM-PAC PT 6 Clicks Mobility  Outcome Measure Help needed turning from your back to your side while in a flat bed without using bedrails?: A Lot Help needed moving from  lying on your back to sitting on the side of a flat bed without using bedrails?: A Lot Help needed moving to and from a bed to a chair (including a wheelchair)?: A Lot Help needed standing up from a chair using your arms (e.g., wheelchair or bedside chair)?: A Lot Help needed to walk in hospital room?:  Total (<20 ft) Help needed climbing 3-5 steps with a railing? : Total 6 Click Score: 10    End of Session   Activity Tolerance: Treatment limited secondary to medical complications (Comment) (HR elevation) Patient left: in bed;with call bell/phone within reach;with bed alarm set;with nursing/sitter in room Nurse Communication: Mobility status;Patient requests pain meds;Other (comment) (HR) PT Visit Diagnosis: Unsteadiness on feet (R26.81);Other abnormalities of gait and mobility (R26.89);Muscle weakness (generalized) (M62.81);Difficulty in walking, not elsewhere classified (R26.2)    Time: 8489-8474 PT Time Calculation (min) (ACUTE ONLY): 15 min   Charges:   PT Evaluation $PT Eval Moderate Complexity: 1 Mod   PT General Charges $$ ACUTE PT VISIT: 1 Visit         Theo Ferretti, PT, DPT Acute Rehabilitation Services  Office: [phone]   Theo CHRISTELLA Ferretti 08/31/2024, 3:52 PM

## 2024-08-31 NOTE — Progress Notes (Signed)
 VAST consult received to obtain 2nd IV access for multiple medications. Patient's arms were assessed bilaterally utilizing ultrasound. Pt's right hand swollen; pt reports her hand has been swollen, but can't recall when it started. Right arm without appropriate vessels for cannulation. Able to place a 22g 1.75 inch USGIV in patient's upper left arm, but it was the last appropriate vessel noted for IV placement. If patient needs further IV access, a PICC or CVC will be needed due to patient's lack of vasculature for IV placement.

## 2024-08-31 NOTE — Plan of Care (Signed)

## 2024-08-31 NOTE — TOC Initial Note (Signed)
 Transition of Care [facility]) - Initial/Assessment Note    Patient Details  Name: [name] MRN: [id number] Date of Birth: 11-07-1955  Transition of Care [facility]) CM/SW Contact:    Marval Gell, RN Phone Number: 08/31/2024, 8:09 AM  Clinical Narrative:                  Per chart review.  Patient admited after sz, etoh withdrawal, con't CIWA- improving.  From motel.  Will need ETOH resources as mentation improves   Expected Discharge Plan: Home/Self Care Barriers to Discharge: Continued Medical Work up   Patient Goals and CMS Choice            Expected Discharge Plan and Services   Discharge Planning Services: CM Consult   Living arrangements for the past 2 months: Hotel/Motel                                      Prior Living Arrangements/Services Living arrangements for the past 2 months: Hotel/Motel Lives with:: Self                   Activities of Daily Living   ADL Screening (condition at time of admission) Independently performs ADLs?: Yes (appropriate for developmental age) Is the patient deaf or have difficulty hearing?: No Does the patient have difficulty seeing, even when wearing glasses/contacts?: No Does the patient have difficulty concentrating, remembering, or making decisions?: No  Permission Sought/Granted                  Emotional Assessment              Admission diagnosis:  Hypercalcemia [E83.52] Delirium [R41.0] Seizure (HCC) [R56.9] Acute encephalopathy [G93.40] Patient Active Problem List   Diagnosis Date Noted   Hypomagnesemia 08/30/2024   Hypercalcemia 08/30/2024   Acute encephalopathy 08/29/2024   Convulsions, status epilepticus (HCC) 06/11/2024   Recurrent seizures (HCC) 08/18/2023   History of seizure 05/05/2023   GAD (generalized anxiety disorder) 05/05/2023   Seizure-like activity (HCC) 12/18/2022   Seizure (HCC) 12/18/2022   Hyperkalemia 10/19/2022   Acute metabolic encephalopathy  10/18/2022   Seizures (HCC) 10/17/2022   Chronic pain    Hypoalbuminemia    Hyperglycemia    Nonadherence to medication    Seizure (HCC) 05/17/2022   Status epilepticus (HCC)    Hypokalemia    Alcohol -induced mood disorder (HCC)    Closed displaced oblique fracture of shaft of left humerus 08/01/2020   GERD (gastroesophageal reflux disease) 02/28/2020   Alcohol  withdrawal related seizure (HCC) 12/13/2018   Major depressive disorder 12/13/2018   Neuropathy 12/13/2018   Seizure due to alcohol  withdrawal (HCC) 12/11/2018   Alcohol  dependence with alcohol -induced mood disorder (HCC)    Major depressive disorder, 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   Alcohol  use disorder, severe, dependence (HCC) 05/16/2018   PCP:  Lenon Nell SAILOR, FNP Pharmacy:   [facility] [address] [place] [address] Phone: [phone] Fax: [phone]  [facility] [address], [place] - [address] [place] [address] Phone: [phone] Fax: [phone]  [facility] [address] ([address] [place] [address] [place]) [place] [address] Phone: [phone] Fax: [phone]     Social Drivers of Health (SDOH) Social History: SDOH Screenings   Food Insecurity: Food Insecurity Present (08/30/2024)  Housing:  High Risk (08/30/2024)  Transportation Needs: Unmet Transportation Needs (08/30/2024)  Utilities: At Risk (08/30/2024)  Alcohol  Screen: High Risk (06/14/2024)  Depression (PHQ2-9): Medium Risk (05/10/2021)  Financial Resource Strain: High Risk (06/14/2024)  Physical Activity: Inactive (06/14/2024)  Social Connections: Patient Unable To Answer (08/30/2024)  Stress: No Stress Concern Present (09/27/2018)  Tobacco Use: High Risk (08/30/2024)   SDOH Interventions:     Readmission Risk Interventions     No data to display

## 2024-08-31 NOTE — Progress Notes (Addendum)
 Subjective: NAEO. No new concerns. More awake, conversing today. States she drinks alcohol  for her diffuse body ache  ROS: negative except above  Examination  Vital signs in last 24 hours: Temp:  [97.4 F (36.3 C)-98.5 F (36.9 C)] 97.8 F (36.6 C) (11/13 1127) Pulse Rate:  [70-91] 91 (11/13 1000) Resp:  [20] 20 (11/12 1500) BP: (97-152)/(67-89) 118/77 (11/13 1127) Weight:  [66.8 kg] 66.8 kg (11/12 1500)  General: lying in bed, NAD Neuro: MS: Alert, oriented to self and place, month: March, follows commands CN: pupils equal and reactive,  EOMI, face symmetric, tongue midline, normal sensation over face, Motor: antigravity strength in all 4 extremities Gait: not tested  Basic Metabolic Panel: Recent Labs  Lab 08/29/24 1819 08/29/24 1833 08/30/24 2122 08/31/24 0208  NA 132*  --  140 137  K 3.6  --  3.2* 2.4*  CL 87*  --  97* 99  CO2 24  --  27 29  GLUCOSE 122*  --  62* 85  BUN <5*  --  6* 6*  CREATININE 0.89  --  0.72 0.66  CALCIUM  11.6*  --  9.5 9.2  MG  --  1.4* 1.7 1.5*  PHOS  --  3.1  --   --     CBC: Recent Labs  Lab 08/29/24 1819 08/30/24 2140 08/31/24 0208  WBC 11.3* 8.0 8.8  HGB 15.0 13.2 13.4  HCT 43.5 38.6 39.2  MCV 99.8 99.5 100.3*  PLT 206 179 147*     Coagulation Studies: No results for input(s): LABPROT, INR in the last 72 hours.  Imaging No new brain imaging   ASSESSMENT AND PLAN:68 year old female who is well-known to neurology team due to history of seizures and repeated breakthrough seizures in the setting of medication noncompliance.  She again presented with breakthrough seizures.   Epilepsy with breakthrough seizure - No sz, dc ltm - Continue Keppra  1000 mg twice daily , Onfi  5 mg nightly and gabapentin  milligrams 3 times daily. LEV level pending - Also on CIWA protocol and thiamine  - Counseled against alcohol  use - Rest of the management per primary team - Plan communicated plan with Dr. Raenelle via secure chat     I  personally spent a total of 35 minutes in the care of the patient today including getting/reviewing separately obtained history, performing a medically appropriate exam/evaluation, counseling and educating, placing orders, referring and communicating with other health care professionals, documenting clinical information in the EHR, independently interpreting results, and coordinating care.          Arlin Krebs [facility] For questions after 5pm please refer to AMION to reach the Neurologist on call

## 2024-08-31 NOTE — Procedures (Addendum)
 Patient Name: [name]  MRN: [id number]  Epilepsy Attending: Arlin MALVA Krebs  Referring Physician/Provider: Khaliqdina, Salman, MD  Duration: 08/30/2024  9161 to 08/31/2024 9161  Patient history: 68 year old female with history of seizures and repeated breakthrough seizures in the setting of medication noncompliance. EEG to evaluate for seizure  Level of alertness: Awake, asleep  AEDs during EEG study: Onfi , GBP, LEV, ativan   Technical aspects: This EEG study was done with scalp electrodes positioned according to the 10-20 International system of electrode placement. Electrical activity was reviewed with band pass filter of 1-70Hz , sensitivity of 7 uV/mm, display speed of 34mm/sec with a 60Hz  notched filter applied as appropriate. EEG data were recorded continuously and digitally stored.  Video monitoring was available and reviewed as appropriate.  Description: No clear posterior dominant rhythm was seen. Sleep was characterized by vertex waves, sleep spindles (12 to 14 Hz), maximal frontocentral region. EEG showed continuous generalized and maximal left posterior quadrant 3 to 6 Hz theta-delta slowing. There is an excessive amount of 15 to 18 Hz beta activity distributed symmetrically and diffusely. Hyperventilation and photic stimulation were not performed.     ABNORMALITY - Continuous slow, generalized generalized and maximal left posterior quadrant - Excessive beta, generalized  IMPRESSION: This study is suggestive of cortical dysfunction arising from left posterior temporal region likely secondary to underlying structural abnormality. Additionally there is generalize cerebral dysfunction (encephalopathy). The excessive beta activity seen in the background is most likely due to the effect of benzodiazepine and is a benign EEG pattern. No seizures or epileptiform discharges were seen throughout the recording.  Javia Dillow O Jeniya Flannigan

## 2024-09-01 DIAGNOSIS — R569 Unspecified convulsions: Secondary | ICD-10-CM | POA: Diagnosis not present

## 2024-09-01 DIAGNOSIS — G9340 Encephalopathy, unspecified: Secondary | ICD-10-CM | POA: Diagnosis not present

## 2024-09-01 DIAGNOSIS — F1024 Alcohol dependence with alcohol-induced mood disorder: Secondary | ICD-10-CM | POA: Diagnosis not present

## 2024-09-01 DIAGNOSIS — F411 Generalized anxiety disorder: Secondary | ICD-10-CM | POA: Diagnosis not present

## 2024-09-01 LAB — CBC
HCT: 40.8 % (ref 36.0–46.0)
Hemoglobin: 13.6 g/dL (ref 12.0–15.0)
MCH: 34.1 pg — ABNORMAL HIGH (ref 26.0–34.0)
MCHC: 33.3 g/dL (ref 30.0–36.0)
MCV: 102.3 fL — ABNORMAL HIGH (ref 80.0–100.0)
Platelets: 126 K/uL — ABNORMAL LOW (ref 150–400)
RBC: 3.99 MIL/uL (ref 3.87–5.11)
RDW: 14.6 % (ref 11.5–15.5)
WBC: 7.3 K/uL (ref 4.0–10.5)
nRBC: 0 % (ref 0.0–0.2)

## 2024-09-01 LAB — PHOSPHORUS: Phosphorus: 2 mg/dL — ABNORMAL LOW (ref 2.5–4.6)

## 2024-09-01 LAB — COMPREHENSIVE METABOLIC PANEL WITH GFR
ALT: 18 U/L (ref 0–44)
AST: 35 U/L (ref 15–41)
Albumin: 2.8 g/dL — ABNORMAL LOW (ref 3.5–5.0)
Alkaline Phosphatase: 49 U/L (ref 38–126)
Anion gap: 10 (ref 5–15)
BUN: 8 mg/dL (ref 8–23)
CO2: 23 mmol/L (ref 22–32)
Calcium: 8.6 mg/dL — ABNORMAL LOW (ref 8.9–10.3)
Chloride: 103 mmol/L (ref 98–111)
Creatinine, Ser: 0.74 mg/dL (ref 0.44–1.00)
GFR, Estimated: >60 mL/min (ref >60)
Glucose, Bld: 81 mg/dL (ref 70–99)
Potassium: 4 mmol/L (ref 3.5–5.1)
Sodium: 136 mmol/L (ref 135–145)
Total Bilirubin: 0.8 mg/dL (ref 0.0–1.2)
Total Protein: 5.4 g/dL — ABNORMAL LOW (ref 6.5–8.1)

## 2024-09-01 LAB — PARATHYROID HORMONE, INTACT (NO CA): PTH: 14 pg/mL — ABNORMAL LOW (ref 15–65)

## 2024-09-01 LAB — MAGNESIUM: Magnesium: 2.1 mg/dL (ref 1.7–2.4)

## 2024-09-01 MED ORDER — OXYCODONE HCL 5 MG PO TABS
5.0000 mg | ORAL_TABLET | Freq: Once | ORAL | Status: AC | PRN
  Administered 2024-09-01: 5 mg via ORAL
  Filled 2024-09-01: qty 1

## 2024-09-01 MED ORDER — SODIUM PHOSPHATES 45 MMOLE/15ML IV SOLN
30.0000 mmol | Freq: Once | INTRAVENOUS | Status: AC
  Administered 2024-09-01: 30 mmol via INTRAVENOUS
  Filled 2024-09-01: qty 10

## 2024-09-01 NOTE — TOC Progression Note (Signed)
 Transition of Care [facility]) - Progression Note    Patient Details  Name: [name] MRN: [id number] Date of Birth: Sep 17, 1956  Transition of Care [facility]) CM/SW Contact  [name], [place] Phone Number: 09/01/2024, 3:14 PM  Clinical Narrative:    SW met with patient at bedside to explore disposition (PT/OT rec SNF) and discuss SUD (etoh). Patient A&O x2 to self and place, not to time - believed it was 10 and [name] was president. We briefly discussed SNF placement for short-term rehab, patient participated in conversation and asked appropriate questions regarding SNF process. Education tentatively provided. Patient did provide consent to contact their significant other [name] to coordinate disposition. Also provided consent to talk to daughter [name] but states they have not spoken in 3 years and are not on good terms.   SW reached out to both #'s for [name] in the chart, VMM left on one number. The other number was for the motel she is staying at with [name] [facility], [address]). Called the room but no response.   FL2 completed in preparation for SNF discharge. TOC following for resolution of symptoms and medical stability.     Expected Discharge Plan: Home/Self Care Barriers to Discharge: Continued Medical Work up               Expected Discharge Plan and Services   Discharge Planning Services: CM Consult   Living arrangements for the past 2 months: Hotel/Motel                                       Social Drivers of Health (SDOH) Interventions SDOH Screenings   Food Insecurity: Food Insecurity Present (08/30/2024)  Housing: High Risk (08/30/2024)  Transportation Needs: Unmet Transportation Needs (08/30/2024)  Utilities: At Risk (08/30/2024)  Alcohol  Screen: High Risk (06/14/2024)  Depression (PHQ2-9): Medium Risk (05/10/2021)  Financial Resource Strain: High Risk (06/14/2024)  Physical Activity: Inactive (06/14/2024)  Social Connections:  Patient Unable To Answer (08/30/2024)  Stress: No Stress Concern Present (09/27/2018)  Tobacco Use: High Risk (08/30/2024)    Readmission Risk Interventions     No data to display

## 2024-09-01 NOTE — Evaluation (Signed)
 Occupational Therapy Evaluation Patient Details Name: [name] MRN: [id number] DOB: 06/07/1956 Today's Date: 09/01/2024   History of Present Illness   Pt is a 68 y.o. female who presented 08/29/24 with confusion and restlessness after a generalizes seizure and subsequently remained encephalopathic. PMH: Seizure disorder, ETOH withdrawal, pancreatitis, anxiety, depression and SAH.     Clinical Impressions Patient admitted for the diagnosis above.  Presents as confused, poor insight into her deficits, and decreased safety.   Patient is needing Mod A for simple transfers, and up to Max A for ADL completion.  Compared to her stated prior level of function, she is performing significantly under that status currently.  OT will follow in the acute setting to address deficits, and Patient will benefit from continued inpatient follow up therapy, <3 hours/day.  Patient does not recognize the need for follow up rehab prior to home, unsure what level of assist her SO can provide at home.      If plan is discharge home, recommend the following:   A lot of help with bathing/dressing/bathroom;A little help with bathing/dressing/bathroom;Assist for transportation;Assistance with cooking/housework;Direct supervision/assist for medications management;Supervision due to cognitive status     Functional Status Assessment   Patient has had a recent decline in their functional status and demonstrates the ability to make significant improvements in function in a reasonable and predictable amount of time.     Equipment Recommendations   BSC/3in1     Recommendations for Other Services         Precautions/Restrictions   Precautions Precautions: Fall;Other (comment) Recall of Precautions/Restrictions: Impaired Precaution/Restrictions Comments: seizures Restrictions Weight Bearing Restrictions Per Provider Order: No     Mobility Bed Mobility Overal bed mobility: Needs  Assistance Bed Mobility: Sit to Supine, Supine to Sit     Supine to sit: Mod assist, HOB elevated Sit to supine: Mod assist        Transfers                   General transfer comment: deferred OOB and to the recliner      Balance Overall balance assessment: Needs assistance Sitting-balance support: No upper extremity supported, Feet supported Sitting balance-Leahy Scale: Fair                                     ADL either performed or assessed with clinical judgement   ADL Overall ADL's : Needs assistance/impaired Eating/Feeding: Set up;Bed level   Grooming: Wash/dry hands;Wash/dry face;Set up;Bed level   Upper Body Bathing: Moderate assistance;Bed level   Lower Body Bathing: Maximal assistance;Bed level   Upper Body Dressing : Moderate assistance;Bed level   Lower Body Dressing: Maximal assistance;Bed level                       Vision Patient Visual Report: No change from baseline       Perception Perception: Within Functional Limits       Praxis Praxis: [facility]       Pertinent Vitals/Pain Pain Assessment Pain Assessment: Faces Faces Pain Scale: No hurt Pain Intervention(s): Monitored during session     Extremity/Trunk Assessment Upper Extremity Assessment Upper Extremity Assessment: Generalized weakness;Right hand dominant;LUE deficits/detail LUE Deficits / Details: describes prior humal fx with plate and screws.  Difficulty touching the top of her head LUE Sensation: WNL LUE Coordination: WNL   Lower Extremity Assessment Lower Extremity Assessment: Defer  to PT evaluation   Cervical / Trunk Assessment Cervical / Trunk Assessment: Normal;Other exceptions Cervical / Trunk Exceptions: Prior ACDF   Communication Communication Communication: No apparent difficulties   Cognition Arousal: Alert Behavior During Therapy: WFL for tasks assessed/performed Cognition: Cognition impaired   Orientation impairments: Time    Memory impairment (select all impairments): Short-term memory, Working memory Attention impairment (select first level of impairment): Sustained attention Executive functioning impairment (select all impairments): Reasoning, Problem solving                   Following commands: Impaired Following commands impaired: Follows one step commands with increased time     Cueing  General Comments   Cueing Techniques: Verbal cues   VSS on RA   Exercises     Shoulder Instructions      Home Living Family/patient expects to be discharged to:: Private residence Living Arrangements: Spouse/significant other Available Help at Discharge: Family;Available 24 hours/day Type of Home: Apartment Home Access: Stairs to enter     Home Layout: One level     Bathroom Shower/Tub: Chief Strategy Officer: Standard Bathroom Accessibility: Yes   Home Equipment: [place] - single point   Additional Comments: Patient stating they live in a hotoe, not an apartment.      Prior Functioning/Environment Prior Level of Function : Independent/Modified Independent             Mobility Comments: Uses SPC mod I ADLs Comments: Pt independent with ADLs, SO and patient share IADLs, take public transportation, SO does most of the cooking    OT Problem List: Decreased strength;Decreased activity tolerance;Impaired balance (sitting and/or standing);Decreased safety awareness;Decreased cognition   OT Treatment/Interventions: Self-care/ADL training;Therapeutic activities;Patient/family education;Balance training;DME and/or AE instruction      OT Goals(Current goals can be found in the care plan section)   Acute Rehab OT Goals Patient Stated Goal: Return home OT Goal Formulation: With patient Time For Goal Achievement: 09/15/24 Potential to Achieve Goals: Fair ADL Goals Pt Will Perform Grooming: with set-up;sitting Pt Will Perform Upper Body Dressing: with set-up;sitting Pt Will  Perform Lower Body Dressing: with contact guard assist;sit to/from stand Pt Will Transfer to Toilet: with contact guard assist;ambulating;regular height toilet   OT Frequency:  Min 2X/week    Co-evaluation              AM-PAC OT 6 Clicks Daily Activity     Outcome Measure Help from another person eating meals?: A Little Help from another person taking care of personal grooming?: A Little Help from another person toileting, which includes using toliet, bedpan, or urinal?: A Lot Help from another person bathing (including washing, rinsing, drying)?: A Lot Help from another person to put on and taking off regular upper body clothing?: A Lot Help from another person to put on and taking off regular lower body clothing?: A Lot 6 Click Score: 14   End of Session Nurse Communication: Mobility status  Activity Tolerance: Patient tolerated treatment well Patient left: in bed;with call bell/phone within reach;with chair alarm set  OT Visit Diagnosis: Unsteadiness on feet (R26.81);Muscle weakness (generalized) (M62.81)                Time: 8860-8841 OT Time Calculation (min): 19 min Charges:  OT General Charges $OT Visit: 1 Visit OT Evaluation $OT Eval Moderate Complexity: 1 Mod  09/01/2024  RP, OTR/L  Acute Rehabilitation Services  Office:  [phone]   Charlie JONETTA Halsted 09/01/2024, 12:42 PM

## 2024-09-01 NOTE — Plan of Care (Signed)
  Problem: Education: Goal: Knowledge of General Education information will improve Description: Including pain rating scale, medication(s)/side effects and non-pharmacologic comfort measures Outcome: Progressing   Problem: Clinical Measurements: Goal: Diagnostic test results will improve Outcome: Progressing Goal: Cardiovascular complication will be avoided Outcome: Progressing   Problem: Activity: Goal: Risk for activity intolerance will decrease Outcome: Progressing   Problem: Coping: Goal: Level of anxiety will decrease Outcome: Progressing   Problem: Pain Managment: Goal: General experience of comfort will improve and/or be controlled Outcome: Progressing   Problem: Safety: Goal: Ability to remain free from injury will improve Outcome: Progressing

## 2024-09-01 NOTE — [place] FL2 (Signed)
 [place]  MEDICAID FL2 LEVEL OF CARE FORM     IDENTIFICATION  Patient Name: [name] Birthdate: 14-Mar-1956 Sex: female Admission Date (Current Location): 08/29/2024  [facility] and [place] Number:  Producer, Television/film/video and Address:  [facility] [place], [address] [place], [place] [address]      Provider Number: [id number]  Attending Physician Name and Address:  Raenelle Donalda HERO, MD  Relative Name and Phone Number:       Current Level of Care: Hospital Recommended Level of Care: Skilled Nursing Facility Prior Approval Number:    Date Approved/Denied:   PASRR Number: [id number]  Discharge Plan: SNF    Current Diagnoses: Patient Active Problem List   Diagnosis Date Noted   Hypomagnesemia 08/30/2024   Hypercalcemia 08/30/2024   Acute encephalopathy 08/29/2024   Convulsions, status epilepticus (HCC) 06/11/2024   Recurrent seizures (HCC) 08/18/2023   History of seizure 05/05/2023   GAD (generalized anxiety disorder) 05/05/2023   Seizure-like activity (HCC) 12/18/2022   Seizure (HCC) 12/18/2022   Hyperkalemia 10/19/2022   Acute metabolic encephalopathy 10/18/2022   Seizures (HCC) 10/17/2022   Chronic pain    Hypoalbuminemia    Hyperglycemia    Nonadherence to medication    Seizure (HCC) 05/17/2022   Status epilepticus (HCC)    Hypokalemia    Alcohol -induced mood disorder (HCC)    Closed displaced oblique fracture of shaft of left humerus 08/01/2020   GERD (gastroesophageal reflux disease) 02/28/2020   Alcohol  withdrawal related seizure (HCC) 12/13/2018   Major depressive disorder 12/13/2018   Neuropathy 12/13/2018   Seizure due to alcohol  withdrawal (HCC) 12/11/2018   Alcohol  dependence with alcohol -induced mood disorder (HCC)    Major depressive disorder, 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   Alcohol  use disorder, severe, dependence (HCC) 05/16/2018    Orientation RESPIRATION BLADDER Height &  Weight     Self, Time  Normal Continent Weight: 147 lb 4.3 oz (66.8 kg) Height:  5' 1 (154.9 cm)  BEHAVIORAL SYMPTOMS/MOOD NEUROLOGICAL BOWEL NUTRITION STATUS    Convulsions/Seizures Continent  (Please see d/c summary)  AMBULATORY STATUS COMMUNICATION OF NEEDS Skin   Extensive Assist Verbally Normal                       Personal Care Assistance Level of Assistance  Bathing, Dressing Bathing Assistance: Maximum assistance   Dressing Assistance: Maximum assistance     Functional Limitations Info             SPECIAL CARE FACTORS FREQUENCY  PT (By licensed PT), OT (By licensed OT)     PT Frequency: 5x/week OT Frequency: 5x/week            Contractures Contractures Info: Not present    Additional Factors Info  Allergies, Code Status Code Status Info: Full Allergies Info: Keflex  (Cephalexin ), Cephalosporins, Prednisone , Toradol  (Ketorolac  Tromethamine ), Ultram  (Tramadol )           Current Medications (09/01/2024):  This is the current hospital active medication list Current Facility-Administered Medications  Medication Dose Route Frequency Provider Last Rate Last Admin   acetaminophen  (TYLENOL ) tablet 650 mg  650 mg Oral Q6H PRN Opyd, Timothy S, MD   650 mg at 09/01/24 1226   Or   acetaminophen  (TYLENOL ) suppository 650 mg  650 mg Rectal Q6H PRN Opyd, Evalene RAMAN, MD       cloBAZam  (ONFI ) tablet 5 mg  5 mg Oral QHS Opyd, Timothy S, MD   5 mg  at 08/31/24 2156   DULoxetine  (CYMBALTA ) DR capsule 30 mg  30 mg Oral Daily Opyd, Timothy S, MD   30 mg at 09/01/24 0932   enoxaparin  (LOVENOX ) injection 40 mg  40 mg Subcutaneous Q24H Opyd, Timothy S, MD   40 mg at 09/01/24 1342   folic acid  (FOLVITE ) tablet 1 mg  1 mg Oral Daily Opyd, Timothy S, MD   1 mg at 09/01/24 0932   gabapentin  (NEURONTIN ) capsule 300 mg  300 mg Oral TID Opyd, Timothy S, MD   300 mg at 09/01/24 0932   levETIRAcetam  (KEPPRA ) undiluted injection 1,000 mg  1,000 mg Intravenous Q12H Opyd, Timothy S, MD    1,000 mg at 09/01/24 0531   LORazepam  (ATIVAN ) injection 0-4 mg  0-4 mg Intravenous Q8H Opyd, Timothy S, MD   1 mg at 09/01/24 1325   LORazepam  (ATIVAN ) tablet 1-4 mg  1-4 mg Oral Q1H PRN Opyd, Timothy S, MD       Or   LORazepam  (ATIVAN ) injection 1-4 mg  1-4 mg Intravenous Q1H PRN Opyd, Timothy S, MD   2 mg at 08/30/24 9167   multivitamin with minerals tablet 1 tablet  1 tablet Oral Daily Opyd, Timothy S, MD   1 tablet at 09/01/24 0932   pantoprazole  (PROTONIX ) EC tablet 40 mg  40 mg Oral Daily Opyd, Timothy S, MD   40 mg at 09/01/24 0932   prochlorperazine (COMPAZINE) injection 5 mg  5 mg Intravenous Q6H PRN Opyd, Evalene RAMAN, MD       risperiDONE  (RISPERDAL ) tablet 0.5 mg  0.5 mg Oral BID Opyd, Timothy S, MD   0.5 mg at 09/01/24 0932   senna-docusate (Senokot-S) tablet 1 tablet  1 tablet Oral QHS PRN Opyd, Timothy S, MD       sodium chloride  flush (NS) 0.9 % injection 3 mL  3 mL Intravenous Q12H Opyd, Timothy S, MD   3 mL at 09/01/24 0934   sodium phosphate  30 mmol in sodium chloride  0.9 % 250 mL infusion  30 mmol Intravenous Once Ghimire, Shanker M, MD 43 mL/hr at 09/01/24 0937 30 mmol at 09/01/24 9062     Discharge Medications: Please see discharge summary for a list of discharge medications.  Relevant Imaging Results:  Relevant Lab Results:   Additional Information SSN: [id number]  [name], [place]

## 2024-09-01 NOTE — Progress Notes (Signed)
 PROGRESS NOTE        PATIENT DETAILS Name: [name] Age: 68 y.o. Sex: female Date of Birth: [birth year] Admit Date: 08/29/2024 Admitting Physician Evalene GORMAN Sprinkles, MD ERE:Jwizmdnw, Nell SAILOR, FNP  Brief Summary: Patient is a 68 y.o.  female with history of EtOH use-longstanding seizures-noncompliance-presented with breakthrough seizures and prolonged postictal state.  Significant events: 11/11>> admit to [facility]  Significant studies: 11/11>> CT head: No acute intracranial abnormality 11/12>> CXR: No acute cardiopulmonary process. 11/12-11/13>> LTM EEG: No active seizures.  Significant microbiology data: None  Procedures: None  Consults: Neurology  Subjective: Sleeping comfortably-still somewhat confused/weak-but redirectable.  RN-patient received several doses of IV Ativan  in the past 24 hours.  Objective: Vitals: Blood pressure (!) 140/95, pulse 65, temperature (!) 97.5 F (36.4 C), temperature source Oral, resp. rate 13, height 5' 1 (1.549 m), weight 66.8 kg, SpO2 100%.   Exam: Gen Exam:Alert awake-not in any distress HEENT:atraumatic, normocephalic Chest: B/L clear to auscultation anteriorly CVS:S1S2 regular Abdomen:soft non tender, non distended Extremities:no edema Neurology: Non focal-generalized weakness Skin: no rash  Pertinent Labs/Radiology:    Latest Ref Rng & Units 09/01/2024    3:37 AM 08/31/2024    2:08 AM 08/30/2024    9:40 PM  CBC  WBC 4.0 - 10.5 K/uL 7.3  8.8  8.0   Hemoglobin 12.0 - 15.0 g/dL 86.3  86.5  86.7   Hematocrit 36.0 - 46.0 % 40.8  39.2  38.6   Platelets 150 - 400 K/uL 126  147  179     Lab Results  Component Value Date   NA 136 09/01/2024   K 4.0 09/01/2024   CL 103 09/01/2024   CO2 23 09/01/2024      Assessment/Plan: Acute metabolic encephalopathy Likely secondary to prolonged postictal state/EtOH withdrawal Slowly improved-still lethargic-confused but redirectable Continue to  treat underlying issues.   Breakthrough seizures No further seizures LTM EEG negative Remains on Keppra /Onfi /gabapentin  Needs to stop drinking Neurology following.   EtOH abuse Awake/alert-somewhat sleepy but no major withdrawal issues Continue Ativan  per CIWA protocol.   Hypokalemia/hypomagnesemia Repleted  Hypophosphatemia Replete/recheck   Mood disorder Continue Cymbalta /risperidone    Non-compliance to meds Will continue coounseling  Code status:   Code Status: Full Code   DVT Prophylaxis: enoxaparin  (LOVENOX ) injection 40 mg Start: 08/30/24 1400   Family Communication: Significant other-[phone] room 606 updated on 11/13   Disposition Plan: Status is: Inpatient Remains inpatient appropriate because: Severity of illness   Planned Discharge Destination:Skilled nursing facility   Diet: Diet Order             Diet regular Room service appropriate? Yes; Fluid consistency: Thin  Diet effective now                     Antimicrobial agents: Anti-infectives (From admission, onward)    None        MEDICATIONS: Scheduled Meds:  cloBAZam   5 mg Oral QHS   DULoxetine   30 mg Oral Daily   enoxaparin  (LOVENOX ) injection  40 mg Subcutaneous Q24H   folic acid   1 mg Oral Daily   gabapentin   300 mg Oral TID   levETIRAcetam   1,000 mg Intravenous Q12H   LORazepam   0-4 mg Intravenous Q8H   multivitamin with minerals  1 tablet Oral Daily   pantoprazole   40 mg Oral Daily   risperiDONE   0.5 mg Oral BID   sodium chloride  flush  3 mL Intravenous Q12H   Continuous Infusions:  sodium PHOSPHATE  IVPB (in mmol) 30 mmol (09/01/24 0937)   PRN Meds:.acetaminophen  **OR** acetaminophen , LORazepam  **OR** LORazepam , prochlorperazine, senna-docusate   I have personally reviewed following labs and imaging studies  LABORATORY DATA: CBC: Recent Labs  Lab 08/29/24 1819 08/30/24 2140 08/31/24 0208 09/01/24 0337  WBC 11.3* 8.0 8.8 7.3  HGB 15.0 13.2 13.4  13.6  HCT 43.5 38.6 39.2 40.8  MCV 99.8 99.5 100.3* 102.3*  PLT 206 179 147* 126*    Basic Metabolic Panel: Recent Labs  Lab 08/29/24 1819 08/29/24 1833 08/30/24 2122 08/31/24 0208 08/31/24 1749 09/01/24 0337  NA 132*  --  140 137 137 136  K 3.6  --  3.2* 2.4* 2.7* 4.0  CL 87*  --  97* 99 101 103  CO2 24  --  27 29 29 23   GLUCOSE 122*  --  62* 85 106* 81  BUN <5*  --  6* 6* 6* 8  CREATININE 0.89  --  0.72 0.66 0.77 0.74  CALCIUM  11.6*  --  9.5 9.2 8.8* 8.6*  MG  --  1.4* 1.7 1.5*  --  2.1  PHOS  --  3.1  --   --   --  2.0*    GFR: Estimated Creatinine Clearance: 58.9 mL/min (by C-G formula based on SCr of 0.74 mg/dL).  Liver Function Tests: Recent Labs  Lab 08/29/24 1819 08/30/24 2122 08/31/24 0208 09/01/24 0337  AST 62* 50* 38 35  ALT 27 22 19 18   ALKPHOS 64 56 45 49  BILITOT 1.5* 1.7* 1.1 0.8  PROT 7.0 6.6 5.7* 5.4*  ALBUMIN 3.9 3.7 3.1* 2.8*   No results for input(s): LIPASE, AMYLASE in the last 168 hours. Recent Labs  Lab 08/31/24 0208  AMMONIA 29    Coagulation Profile: No results for input(s): INR, PROTIME in the last 168 hours.  Cardiac Enzymes: No results for input(s): CKTOTAL, CKMB, CKMBINDEX, TROPONINI in the last 168 hours.  BNP (last 3 results) No results for input(s): PROBNP in the last 8760 hours.  Lipid Profile: No results for input(s): CHOL, HDL, LDLCALC, TRIG, CHOLHDL, LDLDIRECT in the last 72 hours.  Thyroid  Function Tests: Recent Labs    08/30/24 0320  TSH 1.124    Anemia Panel: Recent Labs    08/29/24 2230 08/30/24 2122  VITAMINB12 615  --   FOLATE  --  12.7    Urine analysis:    Component Value Date/Time   COLORURINE YELLOW 08/29/2024 2230   APPEARANCEUR CLEAR 08/29/2024 2230   LABSPEC 1.012 08/29/2024 2230   PHURINE 7.0 08/29/2024 2230   GLUCOSEU NEGATIVE 08/29/2024 2230   HGBUR SMALL (A) 08/29/2024 2230   BILIRUBINUR NEGATIVE 08/29/2024 2230   KETONESUR NEGATIVE 08/29/2024  2230   PROTEINUR 100 (A) 08/29/2024 2230   UROBILINOGEN 0.2 07/14/2019 1451   NITRITE NEGATIVE 08/29/2024 2230   LEUKOCYTESUR NEGATIVE 08/29/2024 2230    Sepsis Labs: Lactic Acid, Venous    Component Value Date/Time   LATICACIDVEN 2.7 (HH) 06/11/2024 1205    MICROBIOLOGY: No results found for this or any previous visit (from the past 240 hours).  RADIOLOGY STUDIES/RESULTS: Overnight EEG with video Result Date: 08/31/2024 Shelton Arlin KIDD, MD     08/31/2024 12:43 PM Patient Name: [name] MRN: [id number] Epilepsy Attending: Arlin KIDD Shelton Referring Physician/Provider: Khaliqdina, Salman, MD Duration: 08/30/2024  9161 to 08/31/2024 9161 Patient history: 67 year old female with history  of seizures and repeated breakthrough seizures in the setting of medication noncompliance. EEG to evaluate for seizure Level of alertness: Awake, asleep AEDs during EEG study: Onfi , GBP, LEV, ativan  Technical aspects: This EEG study was done with scalp electrodes positioned according to the 10-20 International system of electrode placement. Electrical activity was reviewed with band pass filter of 1-70Hz , sensitivity of 7 uV/mm, display speed of 61mm/sec with a 60Hz  notched filter applied as appropriate. EEG data were recorded continuously and digitally stored.  Video monitoring was available and reviewed as appropriate. Description: No clear posterior dominant rhythm was seen. Sleep was characterized by vertex waves, sleep spindles (12 to 14 Hz), maximal frontocentral region. EEG showed continuous generalized and maximal left posterior quadrant 3 to 6 Hz theta-delta slowing. There is an excessive amount of 15 to 18 Hz beta activity distributed symmetrically and diffusely. Hyperventilation and photic stimulation were not performed.   ABNORMALITY - Continuous slow, generalized generalized and maximal left posterior quadrant - Excessive beta, generalized IMPRESSION: This study is suggestive of cortical  dysfunction arising from left posterior temporal region likely secondary to underlying structural abnormality. Additionally there is generalize cerebral dysfunction (encephalopathy). The excessive beta activity seen in the background is most likely due to the effect of benzodiazepine and is a benign EEG pattern. No seizures or epileptiform discharges were seen throughout the recording. Priyanka MALVA Krebs     LOS: 2 days   Donalda Applebaum, MD  [facility]    To contact the attending provider between 7A-7P or the covering provider during after hours 7P-7A, please log into the web site [email or web address] and access using universal [place] password for that web site. If you do not have the password, please call the hospital operator.  09/01/2024, 11:27 AM

## 2024-09-02 DIAGNOSIS — F1024 Alcohol dependence with alcohol-induced mood disorder: Secondary | ICD-10-CM | POA: Diagnosis not present

## 2024-09-02 DIAGNOSIS — F411 Generalized anxiety disorder: Secondary | ICD-10-CM | POA: Diagnosis not present

## 2024-09-02 DIAGNOSIS — G9340 Encephalopathy, unspecified: Secondary | ICD-10-CM | POA: Diagnosis not present

## 2024-09-02 DIAGNOSIS — R569 Unspecified convulsions: Secondary | ICD-10-CM | POA: Diagnosis not present

## 2024-09-02 LAB — MAGNESIUM: Magnesium: 1.7 mg/dL (ref 1.7–2.4)

## 2024-09-02 LAB — BASIC METABOLIC PANEL WITH GFR
Anion gap: 14 (ref 5–15)
BUN: 7 mg/dL — ABNORMAL LOW (ref 8–23)
CO2: 24 mmol/L (ref 22–32)
Calcium: 8.9 mg/dL (ref 8.9–10.3)
Chloride: 102 mmol/L (ref 98–111)
Creatinine, Ser: 0.63 mg/dL (ref 0.44–1.00)
GFR, Estimated: >60 mL/min (ref >60)
Glucose, Bld: 84 mg/dL (ref 70–99)
Potassium: 3.7 mmol/L (ref 3.5–5.1)
Sodium: 140 mmol/L (ref 135–145)

## 2024-09-02 LAB — PHOSPHORUS: Phosphorus: 3.6 mg/dL (ref 2.5–4.6)

## 2024-09-02 MED ORDER — MAGNESIUM SULFATE 2 GM/50ML IV SOLN
2.0000 g | Freq: Once | INTRAVENOUS | Status: AC
  Administered 2024-09-02: 2 g via INTRAVENOUS
  Filled 2024-09-02: qty 50

## 2024-09-02 MED ORDER — SENNOSIDES-DOCUSATE SODIUM 8.6-50 MG PO TABS
2.0000 | ORAL_TABLET | Freq: Every day | ORAL | Status: DC
  Administered 2024-09-02 – 2024-09-04 (×3): 2 via ORAL
  Filled 2024-09-02 (×3): qty 2

## 2024-09-02 MED ORDER — ENSURE PLUS HIGH PROTEIN PO LIQD
237.0000 mL | Freq: Two times a day (BID) | ORAL | Status: DC
  Administered 2024-09-02 – 2024-09-05 (×6): 237 mL via ORAL

## 2024-09-02 MED ORDER — AMLODIPINE BESYLATE 5 MG PO TABS
5.0000 mg | ORAL_TABLET | Freq: Every day | ORAL | Status: DC
  Administered 2024-09-02: 5 mg via ORAL
  Filled 2024-09-02: qty 1

## 2024-09-02 MED ORDER — BISACODYL 10 MG RE SUPP: 10.0000 mg | Freq: Every day | RECTAL | Status: DC | PRN

## 2024-09-02 MED ORDER — METOPROLOL TARTRATE 25 MG PO TABS
25.0000 mg | ORAL_TABLET | Freq: Two times a day (BID) | ORAL | Status: DC
  Administered 2024-09-02: 25 mg via ORAL
  Filled 2024-09-02: qty 1

## 2024-09-02 MED ORDER — POLYETHYLENE GLYCOL 3350 17 G PO PACK
17.0000 g | PACK | Freq: Every day | ORAL | Status: DC
  Administered 2024-09-02 – 2024-09-04 (×3): 17 g via ORAL
  Filled 2024-09-02 (×3): qty 1

## 2024-09-02 NOTE — Progress Notes (Signed)
   09/02/24 1544  Assess: MEWS Score  Temp 98 F (36.7 C)  BP 131/88  MAP (mmHg) 103  Pulse Rate (!) 128  ECG Heart Rate (!) 128  Resp 19  Level of Consciousness Alert  SpO2 93 %  O2 Device Room Air  Assess: if the MEWS score is Yellow or Red  Were vital signs accurate and taken at a resting state? Yes  Does the patient meet 2 or more of the SIRS criteria? No  MEWS guidelines implemented  Yes, yellow  Treat  MEWS Interventions Considered administering scheduled or prn medications/treatments as ordered  Take Vital Signs  Increase Vital Sign Frequency  Yellow: Q2hr x1, continue Q4hrs until patient remains green for 12hrs  Escalate  MEWS: Escalate Yellow: Discuss with charge nurse and consider notifying provider and/or RRT  Provider Notification  Provider Name/Title Dr. Donalda Applebaum  Date Provider Notified 09/02/24  Time Provider Notified 1534  Method of Notification Page (Secure chat)  Notification Reason Change in status  Provider response See new orders  Date of Provider Response 09/02/24  Time of Provider Response 1535  Assess: SIRS CRITERIA  SIRS Temperature  0  SIRS Respirations  0  SIRS Pulse 1  SIRS WBC 0  SIRS Score Sum  1

## 2024-09-02 NOTE — Plan of Care (Signed)
  Problem: Clinical Measurements: Goal: Diagnostic test results will improve Outcome: Progressing   Problem: Coping: Goal: Level of anxiety will decrease Outcome: Progressing   

## 2024-09-02 NOTE — Plan of Care (Signed)
  Problem: Education: Goal: Knowledge of General Education information will improve Description: Including pain rating scale, medication(s)/side effects and non-pharmacologic comfort measures Outcome: Progressing   Problem: Health Behavior/Discharge Planning: Goal: Ability to manage health-related needs will improve Outcome: Progressing   Problem: Clinical Measurements: Goal: Diagnostic test results will improve Outcome: Progressing Goal: Respiratory complications will improve Outcome: Progressing   Problem: Activity: Goal: Risk for activity intolerance will decrease Outcome: Progressing   Problem: Coping: Goal: Level of anxiety will decrease Outcome: Progressing   Problem: Safety: Goal: Ability to remain free from injury will improve Outcome: Progressing

## 2024-09-02 NOTE — Progress Notes (Signed)
 PROGRESS NOTE        PATIENT DETAILS Name: [name] Age: 68 y.o. Sex: female Date of Birth: 01-17-1956 Admit Date: 08/29/2024 Admitting Physician Evalene GORMAN Sprinkles, MD ERE:Jwizmdnw, Nell SAILOR, FNP  Brief Summary: Patient is a 68 y.o.  female with history of EtOH use-longstanding seizures-noncompliance-presented with breakthrough seizures and prolonged postictal state.  Significant events: 11/11>> admit to [facility]  Significant studies: 11/11>> CT head: No acute intracranial abnormality 11/12>> CXR: No acute cardiopulmonary process. 11/12-11/13>> LTM EEG: No active seizures.  Significant microbiology data: None  Procedures: None  Consults: Neurology  Subjective: Much more awake/alert today-still somewhat groggy but answers simple questions appropriately.  Acknowledges that she has to stop drinking-that she is much weaker than usual baseline and understands that she needs to go to SNF.  Objective: Vitals: Blood pressure (!) 150/106, pulse 88, temperature 97.9 F (36.6 C), temperature source Oral, resp. rate 19, height 5' 1 (1.549 m), weight 66.8 kg, SpO2 100%.   Exam: Gen Exam:Alert awake-not in any distress HEENT:atraumatic, normocephalic Chest: B/L clear to auscultation anteriorly CVS:S1S2 regular Abdomen:soft non tender, non distended Extremities:no edema Neurology: Non focal-but with generalized weakness Skin: no rash  Pertinent Labs/Radiology:    Latest Ref Rng & Units 09/01/2024    3:37 AM 08/31/2024    2:08 AM 08/30/2024    9:40 PM  CBC  WBC 4.0 - 10.5 K/uL 7.3  8.8  8.0   Hemoglobin 12.0 - 15.0 g/dL 86.3  86.5  86.7   Hematocrit 36.0 - 46.0 % 40.8  39.2  38.6   Platelets 150 - 400 K/uL 126  147  179     Lab Results  Component Value Date   NA 140 09/02/2024   K 3.7 09/02/2024   CL 102 09/02/2024   CO2 24 09/02/2024      Assessment/Plan: Acute metabolic encephalopathy Likely secondary to prolonged postictal  state/EtOH withdrawal Mentation gradually improving-much more awake/alert today but still groggy/lethargic. Continue to treat underlying etiologies.    Breakthrough seizures No further seizures LTM EEG negative Remains on Keppra /Onfi /gabapentin  Needs to stop drinking Appreciate neurology follow-up   EtOH abuse Awake/alert-somewhat sleepy but no major withdrawal issues-should be in the last days of alcohol  withdrawal at this point. Continue Ativan  per CIWA protocol.   Hypokalemia/hypomagnesemia Repleted  Hypophosphatemia Replete/recheck   Mood disorder Continue Cymbalta /risperidone    Non-compliance to meds Will continue coounseling  Code status:   Code Status: Full Code   DVT Prophylaxis: enoxaparin  (LOVENOX ) injection 40 mg Start: 08/30/24 1400   Family Communication: Significant other-Tony-[phone] room 606 -called-no response-unable to leave voicemail on 11/15.    Disposition Plan: Status is: Inpatient Remains inpatient appropriate because: Severity of illness   Planned Discharge Destination:Skilled nursing facility   Diet: Diet Order             Diet regular Room service appropriate? Yes; Fluid consistency: Thin  Diet effective now                     Antimicrobial agents: Anti-infectives (From admission, onward)    None        MEDICATIONS: Scheduled Meds:  amLODipine   5 mg Oral Daily   cloBAZam   5 mg Oral QHS   DULoxetine   30 mg Oral Daily   enoxaparin  (LOVENOX ) injection  40 mg Subcutaneous Q24H   feeding supplement  237  mL Oral BID BM   folic acid   1 mg Oral Daily   gabapentin   300 mg Oral TID   levETIRAcetam   1,000 mg Intravenous Q12H   LORazepam   0-4 mg Intravenous Q8H   multivitamin with minerals  1 tablet Oral Daily   pantoprazole   40 mg Oral Daily   risperiDONE   0.5 mg Oral BID   sodium chloride  flush  3 mL Intravenous Q12H   Continuous Infusions:   PRN Meds:.acetaminophen  **OR** acetaminophen , prochlorperazine,  senna-docusate   I have personally reviewed following labs and imaging studies  LABORATORY DATA: CBC: Recent Labs  Lab 08/29/24 1819 08/30/24 2140 08/31/24 0208 09/01/24 0337  WBC 11.3* 8.0 8.8 7.3  HGB 15.0 13.2 13.4 13.6  HCT 43.5 38.6 39.2 40.8  MCV 99.8 99.5 100.3* 102.3*  PLT 206 179 147* 126*    Basic Metabolic Panel: Recent Labs  Lab 08/29/24 1833 08/30/24 2122 08/31/24 0208 08/31/24 1749 09/01/24 0337 09/02/24 0750  NA  --  140 137 137 136 140  K  --  3.2* 2.4* 2.7* 4.0 3.7  CL  --  97* 99 101 103 102  CO2  --  27 29 29 23 24   GLUCOSE  --  62* 85 106* 81 84  BUN  --  6* 6* 6* 8 7*  CREATININE  --  0.72 0.66 0.77 0.74 0.63  CALCIUM   --  9.5 9.2 8.8* 8.6* 8.9  MG 1.4* 1.7 1.5*  --  2.1 1.7  PHOS 3.1  --   --   --  2.0* 3.6    GFR: Estimated Creatinine Clearance: 58.9 mL/min (by C-G formula based on SCr of 0.63 mg/dL).  Liver Function Tests: Recent Labs  Lab 08/29/24 1819 08/30/24 2122 08/31/24 0208 09/01/24 0337  AST 62* 50* 38 35  ALT 27 22 19 18   ALKPHOS 64 56 45 49  BILITOT 1.5* 1.7* 1.1 0.8  PROT 7.0 6.6 5.7* 5.4*  ALBUMIN 3.9 3.7 3.1* 2.8*   No results for input(s): LIPASE, AMYLASE in the last 168 hours. Recent Labs  Lab 08/31/24 0208  AMMONIA 29    Coagulation Profile: No results for input(s): INR, PROTIME in the last 168 hours.  Cardiac Enzymes: No results for input(s): CKTOTAL, CKMB, CKMBINDEX, TROPONINI in the last 168 hours.  BNP (last 3 results) No results for input(s): PROBNP in the last 8760 hours.  Lipid Profile: No results for input(s): CHOL, HDL, LDLCALC, TRIG, CHOLHDL, LDLDIRECT in the last 72 hours.  Thyroid  Function Tests: No results for input(s): TSH, T4TOTAL, FREET4, T3FREE, THYROIDAB in the last 72 hours.   Anemia Panel: Recent Labs    08/30/24 2122  FOLATE 12.7    Urine analysis:    Component Value Date/Time   COLORURINE YELLOW 08/29/2024 2230   APPEARANCEUR  CLEAR 08/29/2024 2230   LABSPEC 1.012 08/29/2024 2230   PHURINE 7.0 08/29/2024 2230   GLUCOSEU NEGATIVE 08/29/2024 2230   HGBUR SMALL (A) 08/29/2024 2230   BILIRUBINUR NEGATIVE 08/29/2024 2230   KETONESUR NEGATIVE 08/29/2024 2230   PROTEINUR 100 (A) 08/29/2024 2230   UROBILINOGEN 0.2 07/14/2019 1451   NITRITE NEGATIVE 08/29/2024 2230   LEUKOCYTESUR NEGATIVE 08/29/2024 2230    Sepsis Labs: Lactic Acid, Venous    Component Value Date/Time   LATICACIDVEN 2.7 (HH) 06/11/2024 1205    MICROBIOLOGY: No results found for this or any previous visit (from the past 240 hours).  RADIOLOGY STUDIES/RESULTS: No results found.    LOS: 3 days   Donalda Applebaum, MD  [facility]    To contact the attending provider between 7A-7P or the covering provider during after hours 7P-7A, please log into the web site [email or web address] and access using universal  password for that web site. If you do not have the password, please call the hospital operator.  09/02/2024, 9:33 AM

## 2024-09-03 DIAGNOSIS — G9340 Encephalopathy, unspecified: Secondary | ICD-10-CM | POA: Diagnosis not present

## 2024-09-03 LAB — BASIC METABOLIC PANEL WITH GFR
Anion gap: 16 — ABNORMAL HIGH (ref 5–15)
BUN: 7 mg/dL — ABNORMAL LOW (ref 8–23)
CO2: 24 mmol/L (ref 22–32)
Calcium: 9.5 mg/dL (ref 8.9–10.3)
Chloride: 100 mmol/L (ref 98–111)
Creatinine, Ser: 0.6 mg/dL (ref 0.44–1.00)
GFR, Estimated: >60 mL/min (ref >60)
Glucose, Bld: 92 mg/dL (ref 70–99)
Potassium: 3.1 mmol/L — ABNORMAL LOW (ref 3.5–5.1)
Sodium: 140 mmol/L (ref 135–145)

## 2024-09-03 LAB — MAGNESIUM: Magnesium: 1.8 mg/dL (ref 1.7–2.4)

## 2024-09-03 LAB — VITAMIN B1: Vitamin B1 (Thiamine): 226.2 nmol/L — ABNORMAL HIGH (ref 66.5–200.0)

## 2024-09-03 MED ORDER — POTASSIUM CHLORIDE CRYS ER 20 MEQ PO TBCR
40.0000 meq | EXTENDED_RELEASE_TABLET | Freq: Once | ORAL | Status: AC
  Administered 2024-09-03: 40 meq via ORAL
  Filled 2024-09-03: qty 2

## 2024-09-03 MED ORDER — POTASSIUM CHLORIDE 2 MEQ/ML IV SOLN
INTRAVENOUS | Status: AC
  Filled 2024-09-03: qty 1000

## 2024-09-03 MED ORDER — OXYCODONE HCL 5 MG PO TABS
2.5000 mg | ORAL_TABLET | Freq: Three times a day (TID) | ORAL | Status: DC | PRN
  Administered 2024-09-03 – 2024-09-04 (×2): 2.5 mg via ORAL
  Filled 2024-09-03 (×2): qty 1

## 2024-09-03 MED ORDER — TRAMADOL HCL 50 MG PO TABS: 50.0000 mg | ORAL_TABLET | Freq: Two times a day (BID) | ORAL | Status: DC | PRN

## 2024-09-03 MED ORDER — METOPROLOL TARTRATE 50 MG PO TABS
50.0000 mg | ORAL_TABLET | Freq: Two times a day (BID) | ORAL | Status: DC
  Administered 2024-09-03 – 2024-09-05 (×5): 50 mg via ORAL
  Filled 2024-09-03 (×5): qty 1

## 2024-09-03 MED ORDER — AMLODIPINE BESYLATE 10 MG PO TABS
10.0000 mg | ORAL_TABLET | Freq: Every day | ORAL | Status: DC
  Administered 2024-09-03 – 2024-09-05 (×3): 10 mg via ORAL
  Filled 2024-09-03 (×3): qty 1

## 2024-09-03 NOTE — Progress Notes (Addendum)
 PROGRESS NOTE        PATIENT DETAILS Name: [name] Age: 68 y.o. Sex: female Date of Birth: 04-03-1956 Admit Date: 08/29/2024 Admitting Physician Evalene GORMAN Sprinkles, MD ERE:Jwizmdnw, Nell SAILOR, FNP  Brief Summary: Patient is a 68 y.o.  female with history of EtOH use-longstanding seizures-noncompliance-presented with breakthrough seizures and prolonged postictal state.  Significant events: 11/11>> admit to [facility]  Significant studies: 11/11>> CT head: No acute intracranial abnormality 11/12>> CXR: No acute cardiopulmonary process. 11/12-11/13>> LTM EEG: No active seizures.  Significant microbiology data: None  Procedures: None  Consults: Neurology  Subjective:  [name], appears comfortable, denies any headache, no fever, no chest pain or pressure, no shortness of breath , no abdominal pain. No new focal weakness.  Objective: Vitals: Blood pressure (!) 154/88, pulse 79, temperature 97.6 F (36.4 C), temperature source Oral, resp. rate 19, height 5' 1 (1.549 m), weight 66.8 kg, SpO2 92%.    Exam:  Awake Alert, No new F.N deficits, Normal affect [place].AT,PERRAL Supple Neck, No JVD,   Symmetrical Chest wall movement, Good air movement bilaterally, CTAB RRR,No Gallops, Rubs or new Murmurs,  +ve B.Sounds, Abd Soft, No tenderness,   No Cyanosis, Clubbing or edema    Assessment/Plan:  Acute metabolic encephalopathy Likely secondary to prolonged postictal state/EtOH withdrawal Mentation gradually improving-improving continue to monitor, no signs of active DTs or seizures now Continue to treat underlying etiologies.    Breakthrough seizures No further seizures LTM EEG negative Remains on Keppra /Onfi /gabapentin  Needs to stop drinking Appreciate neurology follow-up   EtOH abuse Awake/alert-somewhat sleepy but no major withdrawal issues-should be in the last days of alcohol  withdrawal at this point. Continue Ativan  per CIWA  protocol.   Hypokalemia/hypomagnesemia/hypophosphatemia Repleted  Hypertension.  On Norvasc  and Lopressor  dose adjusted for better control.    Mood disorder Continue Cymbalta /risperidone    Non-compliance to meds Will continue coounseling  Code status:   Code Status: Full Code   DVT Prophylaxis: enoxaparin  (LOVENOX ) injection 40 mg Start: 08/30/24 1400   Family Communication: Significant other-[phone] room 606 -called-no response-unable to leave voicemail on 09/03/2024 11.34 am   Disposition Plan: Status is: Inpatient Remains inpatient appropriate because: Severity of illness   Planned Discharge Destination:Skilled nursing facility   Diet: Diet Order             Diet regular Room service appropriate? Yes; Fluid consistency: Thin  Diet effective now                     Antimicrobial agents: Anti-infectives (From admission, onward)    None        MEDICATIONS: Scheduled Meds:  amLODipine   10 mg Oral Daily   cloBAZam   5 mg Oral QHS   DULoxetine   30 mg Oral Daily   enoxaparin  (LOVENOX ) injection  40 mg Subcutaneous Q24H   feeding supplement  237 mL Oral BID BM   folic acid   1 mg Oral Daily   gabapentin   300 mg Oral TID   levETIRAcetam   1,000 mg Intravenous Q12H   metoprolol  tartrate  50 mg Oral BID   multivitamin with minerals  1 tablet Oral Daily   pantoprazole   40 mg Oral Daily   polyethylene glycol  17 g Oral Daily   potassium chloride   40 mEq Oral Once   risperiDONE   0.5 mg Oral BID   senna-docusate  2 tablet Oral QHS   sodium chloride  flush  3 mL Intravenous Q12H   Continuous Infusions:  lactated ringers  1,000 mL with potassium chloride  20 mEq infusion      PRN Meds:.acetaminophen  **OR** acetaminophen , bisacodyl, prochlorperazine   I have personally reviewed following labs and imaging studies  LABORATORY DATA: CBC: Recent Labs  Lab 08/29/24 1819 08/30/24 2140 08/31/24 0208 09/01/24 0337  WBC 11.3* 8.0 8.8 7.3  HGB 15.0  13.2 13.4 13.6  HCT 43.5 38.6 39.2 40.8  MCV 99.8 99.5 100.3* 102.3*  PLT 206 179 147* 126*    Basic Metabolic Panel: Recent Labs  Lab 08/29/24 1833 08/30/24 2122 08/31/24 0208 08/31/24 1749 09/01/24 0337 09/02/24 0750 09/03/24 0642  NA  --  140 137 137 136 140 140  K  --  3.2* 2.4* 2.7* 4.0 3.7 3.1*  CL  --  97* 99 101 103 102 100  CO2  --  27 29 29 23 24 24   GLUCOSE  --  62* 85 106* 81 84 92  BUN  --  6* 6* 6* 8 7* 7*  CREATININE  --  0.72 0.66 0.77 0.74 0.63 0.60  CALCIUM   --  9.5 9.2 8.8* 8.6* 8.9 9.5  MG 1.4* 1.7 1.5*  --  2.1 1.7 1.8  PHOS 3.1  --   --   --  2.0* 3.6  --     GFR: Estimated Creatinine Clearance: 58.9 mL/min (by C-G formula based on SCr of 0.6 mg/dL).  Liver Function Tests: Recent Labs  Lab 08/29/24 1819 08/30/24 2122 08/31/24 0208 09/01/24 0337  AST 62* 50* 38 35  ALT 27 22 19 18   ALKPHOS 64 56 45 49  BILITOT 1.5* 1.7* 1.1 0.8  PROT 7.0 6.6 5.7* 5.4*  ALBUMIN 3.9 3.7 3.1* 2.8*   No results for input(s): LIPASE, AMYLASE in the last 168 hours. Recent Labs  Lab 08/31/24 0208  AMMONIA 29    Coagulation Profile: No results for input(s): INR, PROTIME in the last 168 hours.  Cardiac Enzymes: No results for input(s): CKTOTAL, CKMB, CKMBINDEX, TROPONINI in the last 168 hours.  BNP (last 3 results) No results for input(s): PROBNP in the last 8760 hours.  Lipid Profile: No results for input(s): CHOL, HDL, LDLCALC, TRIG, CHOLHDL, LDLDIRECT in the last 72 hours.  Thyroid  Function Tests: No results for input(s): TSH, T4TOTAL, FREET4, T3FREE, THYROIDAB in the last 72 hours.   Anemia Panel: No results for input(s): VITAMINB12, FOLATE, FERRITIN, TIBC, IRON, RETICCTPCT in the last 72 hours.   Urine analysis:    Component Value Date/Time   COLORURINE YELLOW 08/29/2024 2230   APPEARANCEUR CLEAR 08/29/2024 2230   LABSPEC 1.012 08/29/2024 2230   PHURINE 7.0 08/29/2024 2230   GLUCOSEU  NEGATIVE 08/29/2024 2230   HGBUR SMALL (A) 08/29/2024 2230   BILIRUBINUR NEGATIVE 08/29/2024 2230   KETONESUR NEGATIVE 08/29/2024 2230   PROTEINUR 100 (A) 08/29/2024 2230   UROBILINOGEN 0.2 07/14/2019 1451   NITRITE NEGATIVE 08/29/2024 2230   LEUKOCYTESUR NEGATIVE 08/29/2024 2230    Sepsis Labs: Lactic Acid, Venous    Component Value Date/Time   LATICACIDVEN 2.7 (HH) 06/11/2024 1205    MICROBIOLOGY: No results found for this or any previous visit (from the past 240 hours).  RADIOLOGY STUDIES/RESULTS: No results found.    LOS: 4 days   Lavada Stank, MD  [facility]    To contact the attending provider between 7A-7P or the covering provider during after hours 7P-7A, please log into the web site  [email or web address] and access using universal [place] password for that web site. If you do not have the password, please call the hospital operator.  09/03/2024, 9:11 AM

## 2024-09-03 NOTE — Plan of Care (Signed)
   Problem: Coping: Goal: Level of anxiety will decrease Outcome: Progressing

## 2024-09-04 DIAGNOSIS — G9340 Encephalopathy, unspecified: Secondary | ICD-10-CM | POA: Diagnosis not present

## 2024-09-04 LAB — BASIC METABOLIC PANEL WITH GFR
Anion gap: 10 (ref 5–15)
BUN: 10 mg/dL (ref 8–23)
CO2: 24 mmol/L (ref 22–32)
Calcium: 9.4 mg/dL (ref 8.9–10.3)
Chloride: 107 mmol/L (ref 98–111)
Creatinine, Ser: 0.73 mg/dL (ref 0.44–1.00)
GFR, Estimated: >60 mL/min (ref >60)
Glucose, Bld: 84 mg/dL (ref 70–99)
Potassium: 3.7 mmol/L (ref 3.5–5.1)
Sodium: 141 mmol/L (ref 135–145)

## 2024-09-04 LAB — CBC WITH DIFFERENTIAL/PLATELET
Abs Immature Granulocytes: 0.02 K/uL (ref 0.00–0.07)
Basophils Absolute: 0 K/uL (ref 0.0–0.1)
Basophils Relative: 0 %
Eosinophils Absolute: 0.1 K/uL (ref 0.0–0.5)
Eosinophils Relative: 2 %
HCT: 40.9 % (ref 36.0–46.0)
Hemoglobin: 13.4 g/dL (ref 12.0–15.0)
Immature Granulocytes: 0 %
Lymphocytes Relative: 48 %
Lymphs Abs: 2.2 K/uL (ref 0.7–4.0)
MCH: 33.5 pg (ref 26.0–34.0)
MCHC: 32.8 g/dL (ref 30.0–36.0)
MCV: 102.3 fL — ABNORMAL HIGH (ref 80.0–100.0)
Monocytes Absolute: 0.5 K/uL (ref 0.1–1.0)
Monocytes Relative: 11 %
Neutro Abs: 1.8 K/uL (ref 1.7–7.7)
Neutrophils Relative %: 39 %
Platelets: 158 K/uL (ref 150–400)
RBC: 4 MIL/uL (ref 3.87–5.11)
RDW: 15.2 % (ref 11.5–15.5)
WBC: 4.7 K/uL (ref 4.0–10.5)
nRBC: 0 % (ref 0.0–0.2)

## 2024-09-04 LAB — PHOSPHORUS: Phosphorus: 3.9 mg/dL (ref 2.5–4.6)

## 2024-09-04 LAB — MAGNESIUM: Magnesium: 1.7 mg/dL (ref 1.7–2.4)

## 2024-09-04 MED ORDER — OXYCODONE HCL 5 MG PO TABS
2.5000 mg | ORAL_TABLET | Freq: Two times a day (BID) | ORAL | Status: DC | PRN
  Administered 2024-09-04: 2.5 mg via ORAL
  Filled 2024-09-04: qty 1

## 2024-09-04 MED ORDER — VITAMIN B-12 1000 MCG PO TABS
1000.0000 ug | ORAL_TABLET | Freq: Every day | ORAL | Status: DC
  Administered 2024-09-04 – 2024-09-05 (×2): 1000 ug via ORAL
  Filled 2024-09-04 (×2): qty 1
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
## 2024-09-04 NOTE — Progress Notes (Signed)
 PROGRESS NOTE        PATIENT DETAILS Name: [name] Age: 68 y.o. Sex: female Date of Birth: 02/16/1956 Admit Date: 08/29/2024 Admitting Physician Evalene GORMAN Sprinkles, MD ERE:Jwizmdnw, Nell SAILOR, FNP  Brief Summary: Patient is a 68 y.o.  female with history of EtOH use-longstanding seizures-noncompliance-presented with breakthrough seizures and prolonged postictal state.  Significant events: 11/11>> admit to [facility]  Significant studies: 11/11>> CT head: No acute intracranial abnormality 11/12>> CXR: No acute cardiopulmonary process. 11/12-11/13>> LTM EEG: No active seizures.  Significant microbiology data: None  Procedures: None  Consults: Neurology  Subjective: [name], appears comfortable, denies any headache, no fever, no chest pain or pressure, no shortness of breath , no abdominal pain. No new focal weakness.  Wants to make sure she is getting her pain medicines on time.   Objective: Vitals: Blood pressure 115/77, pulse 72, temperature 97.6 F (36.4 C), temperature source Oral, resp. rate 11, height 5' 1 (1.549 m), weight 66.8 kg, SpO2 (!) 88%.    Exam:  More awake and alert morning of 09/04/2024 having breakfast, no new F.N deficits, Normal affect [place].AT,PERRAL Supple Neck, No JVD,   Symmetrical Chest wall movement, Good air movement bilaterally, CTAB RRR,No Gallops, Rubs or new Murmurs,  +ve B.Sounds, Abd Soft, No tenderness,   No Cyanosis, Clubbing or edema    Assessment/Plan:  Acute metabolic encephalopathy Likely secondary to prolonged postictal state/EtOH withdrawal Mentation gradually improving-improving continue to monitor, no signs of active DTs or seizures now Continue to treat underlying etiologies.    Breakthrough seizures No further seizures LTM EEG negative Remains on Keppra /Onfi /gabapentin  Needs to stop drinking Appreciate neurology follow-up   EtOH abuse Awake/alert-somewhat sleepy but no major  withdrawal issues-should be in the last days of alcohol  withdrawal at this point. No signs of DTs.   Hypokalemia/hypomagnesemia/hypophosphatemia Repleted  Hypertension.  On Norvasc  and Lopressor  dose adjusted for better control.    Mood disorder Continue Cymbalta /risperidone , requesting low-dose narcotic will be added with caution.   Non-compliance to meds Will continue coounseling  Code status:   Code Status: Full Code   DVT Prophylaxis: enoxaparin  (LOVENOX ) injection 40 mg Start: 08/30/24 1400   Family Communication: Significant other-[phone] room 606 -called-no response-unable to leave voicemail on 09/03/2024 11.34 am   Disposition Plan: Status is: Inpatient Remains inpatient appropriate because: Severity of illness   Planned Discharge Destination:Skilled nursing facility   Diet: Diet Order             Diet regular Room service appropriate? Yes; Fluid consistency: Thin  Diet effective now                     Antimicrobial agents: Anti-infectives (From admission, onward)    None        MEDICATIONS: Scheduled Meds:  amLODipine   10 mg Oral Daily   cloBAZam   5 mg Oral QHS   cyanocobalamin   1,000 mcg Oral Daily   DULoxetine   30 mg Oral Daily   enoxaparin  (LOVENOX ) injection  40 mg Subcutaneous Q24H   feeding supplement  237 mL Oral BID BM   folic acid   1 mg Oral Daily   gabapentin   300 mg Oral TID   levETIRAcetam   1,000 mg Intravenous Q12H   metoprolol  tartrate  50 mg Oral BID   multivitamin with minerals  1 tablet Oral Daily   pantoprazole   40 mg Oral Daily   polyethylene glycol  17 g Oral Daily   risperiDONE   0.5 mg Oral BID   senna-docusate  2 tablet Oral QHS   sodium chloride  flush  3 mL Intravenous Q12H   Continuous Infusions:    PRN Meds:.acetaminophen  **OR** acetaminophen , bisacodyl, oxyCODONE , prochlorperazine   I have personally reviewed following labs and imaging studies  LABORATORY DATA: CBC: Recent Labs  Lab  08/29/24 1819 08/30/24 2140 08/31/24 0208 09/01/24 0337 09/04/24 0240  WBC 11.3* 8.0 8.8 7.3 4.7  NEUTROABS  --   --   --   --  1.8  HGB 15.0 13.2 13.4 13.6 13.4  HCT 43.5 38.6 39.2 40.8 40.9  MCV 99.8 99.5 100.3* 102.3* 102.3*  PLT 206 179 147* 126* 158    Basic Metabolic Panel: Recent Labs  Lab 08/29/24 1833 08/30/24 2122 08/31/24 0208 08/31/24 1749 09/01/24 0337 09/02/24 0750 09/03/24 0642 09/04/24 0240  NA  --    < > 137 137 136 140 140 141  K  --    < > 2.4* 2.7* 4.0 3.7 3.1* 3.7  CL  --    < > 99 101 103 102 100 107  CO2  --    < > 29 29 23 24 24 24   GLUCOSE  --    < > 85 106* 81 84 92 84  BUN  --    < > 6* 6* 8 7* 7* 10  CREATININE  --    < > 0.66 0.77 0.74 0.63 0.60 0.73  CALCIUM   --    < > 9.2 8.8* 8.6* 8.9 9.5 9.4  MG 1.4*   < > 1.5*  --  2.1 1.7 1.8 1.7  PHOS 3.1  --   --   --  2.0* 3.6  --  3.9   < > = values in this interval not displayed.    GFR: Estimated Creatinine Clearance: 58.9 mL/min (by C-G formula based on SCr of 0.73 mg/dL).  Liver Function Tests: Recent Labs  Lab 08/29/24 1819 08/30/24 2122 08/31/24 0208 09/01/24 0337  AST 62* 50* 38 35  ALT 27 22 19 18   ALKPHOS 64 56 45 49  BILITOT 1.5* 1.7* 1.1 0.8  PROT 7.0 6.6 5.7* 5.4*  ALBUMIN 3.9 3.7 3.1* 2.8*   No results for input(s): LIPASE, AMYLASE in the last 168 hours. Recent Labs  Lab 08/31/24 0208  AMMONIA 29    Coagulation Profile: No results for input(s): INR, PROTIME in the last 168 hours.  Cardiac Enzymes: No results for input(s): CKTOTAL, CKMB, CKMBINDEX, TROPONINI in the last 168 hours.  BNP (last 3 results) No results for input(s): PROBNP in the last 8760 hours.  Lipid Profile: No results for input(s): CHOL, HDL, LDLCALC, TRIG, CHOLHDL, LDLDIRECT in the last 72 hours.  Thyroid  Function Tests: No results for input(s): TSH, T4TOTAL, FREET4, T3FREE, THYROIDAB in the last 72 hours.   Anemia Panel: No results for input(s):  VITAMINB12, FOLATE, FERRITIN, TIBC, IRON, RETICCTPCT in the last 72 hours.   Urine analysis:    Component Value Date/Time   COLORURINE YELLOW 08/29/2024 2230   APPEARANCEUR CLEAR 08/29/2024 2230   LABSPEC 1.012 08/29/2024 2230   PHURINE 7.0 08/29/2024 2230   GLUCOSEU NEGATIVE 08/29/2024 2230   HGBUR SMALL (A) 08/29/2024 2230   BILIRUBINUR NEGATIVE 08/29/2024 2230   KETONESUR NEGATIVE 08/29/2024 2230   PROTEINUR 100 (A) 08/29/2024 2230   UROBILINOGEN 0.2 07/14/2019 1451   NITRITE NEGATIVE 08/29/2024 2230   LEUKOCYTESUR NEGATIVE  08/29/2024 2230    Sepsis Labs: Lactic Acid, Venous    Component Value Date/Time   LATICACIDVEN 2.7 (HH) 06/11/2024 1205    MICROBIOLOGY: No results found for this or any previous visit (from the past 240 hours).  RADIOLOGY STUDIES/RESULTS: No results found.    LOS: 5 days   Lavada Stank, MD  [facility]    To contact the attending provider between 7A-7P or the covering provider during after hours 7P-7A, please log into the web site [email or web address] and access using universal [place] password for that web site. If you do not have the password, please call the hospital operator.  09/04/2024, 7:29 AM

## 2024-09-04 NOTE — Progress Notes (Signed)
 PT Cancellation Note  Patient Details Name: Andriea Hasegawa MRN: [id number] DOB: April 14, 1956   Cancelled Treatment:    Reason Eval/Treat Not Completed: Other (comment) (Pt working with CM, politely request PT to come back later. Will follow up later if time allows.)   Anjel Perfetti 09/04/2024, 11:22 AM

## 2024-09-04 NOTE — Progress Notes (Signed)
 Physical Therapy Treatment Patient Details Name: [name] MRN: [id number] DOB: [birth year] Today's Date: 09/04/2024   History of Present Illness Pt is a 68 y.o. female who presented 08/29/24 with confusion and restlessness after a generalizes seizure and subsequently remained encephalopathic. PMH: Seizure disorder, ETOH withdrawal, pancreatitis, anxiety, depression and SAH.    PT Comments  Pt tolerated treatment well today. Pt today was able to transfer to chair with CGA for safety. Ambulation deferred as pt noted to still have poor activity tolerance. No change in DC/DME recs at this time. PT will continue to follow.     If plan is discharge home, recommend the following: A lot of help with walking and/or transfers;A lot of help with bathing/dressing/bathroom;Assistance with cooking/housework;Direct supervision/assist for medications management;Direct supervision/assist for financial management;Help with stairs or ramp for entrance;Assist for transportation;Supervision due to cognitive status   Can travel by private vehicle     Yes  Equipment Recommendations  Rolling walker (2 wheels);BSC/3in1    Recommendations for Other Services       Precautions / Restrictions Precautions Precautions: Fall;Other (comment) Recall of Precautions/Restrictions: Impaired Precaution/Restrictions Comments: seizures Restrictions Weight Bearing Restrictions Per Provider Order: No     Mobility  Bed Mobility Overal bed mobility: Needs Assistance Bed Mobility: Supine to Sit     Supine to sit: Contact guard     General bed mobility comments: CGA for safety.    Transfers Overall transfer level: Needs assistance Equipment used: 1 person hand held assist Transfers: Sit to/from Stand, Bed to chair/wheelchair/BSC Sit to Stand: Contact guard assist   Step pivot transfers: Contact guard assist       General transfer comment: CGA to step pivot transfer into chair. Ambulation deferred  due to fatigue.    Ambulation/Gait                   Stairs             Wheelchair Mobility     Tilt Bed    Modified Rankin (Stroke Patients Only)       Balance Overall balance assessment: Needs assistance Sitting-balance support: No upper extremity supported, Feet supported Sitting balance-Leahy Scale: Fair Sitting balance - Comments: static sitting EOB with CGA for safety   Standing balance support: Bilateral upper extremity supported, During functional activity, Reliant on assistive device for balance Standing balance-Leahy Scale: Poor Standing balance comment: reliant on bil HHA and min-modA                            Communication Communication Communication: No apparent difficulties  Cognition Arousal: Alert Behavior During Therapy: WFL for tasks assessed/performed   PT - Cognitive impairments: Orientation, Awareness, Memory, Attention, Initiation, Sequencing, Problem solving, Safety/Judgement   Orientation impairments: Place, Time, Situation                   PT - Cognition Comments: Pt appeared to be more oriented compared to previous sessions. 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 Following commands: Intact Following commands impaired: Follows one step commands with increased time    Cueing Cueing Techniques: Verbal cues  Exercises      General Comments General comments (skin integrity, edema, etc.): VSS      Pertinent Vitals/Pain Pain Assessment Pain Assessment: No/denies pain    Home Living  Prior Function            PT Goals (current goals can now be found in the care plan section) Progress towards PT goals: Progressing toward goals    Frequency    Min 3X/week      PT Plan      Co-evaluation              AM-PAC PT 6 Clicks Mobility   Outcome Measure  Help needed turning from  your back to your side while in a flat bed without using bedrails?: A Little Help needed moving from lying on your back to sitting on the side of a flat bed without using bedrails?: A Little Help needed moving to and from a bed to a chair (including a wheelchair)?: A Little Help needed standing up from a chair using your arms (e.g., wheelchair or bedside chair)?: A Little Help needed to walk in hospital room?: A Lot Help needed climbing 3-5 steps with a railing? : Total 6 Click Score: 15    End of Session Equipment Utilized During Treatment: Gait belt Activity Tolerance: Patient tolerated treatment well Patient left: in chair;with call bell/phone within reach;with chair alarm set Nurse Communication: Mobility status PT Visit Diagnosis: Unsteadiness on feet (R26.81);Other abnormalities of gait and mobility (R26.89);Muscle weakness (generalized) (M62.81);Difficulty in walking, not elsewhere classified (R26.2)     Time: 8495-8481 PT Time Calculation (min) (ACUTE ONLY): 14 min  Charges:    $Therapeutic Activity: 8-22 mins PT General Charges $$ ACUTE PT VISIT: 1 Visit                     Ambreen Tufte B, PT, DPT Acute Rehab Services [phone]    Mariona Scholes 09/04/2024, 4:06 PM
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
## 2024-09-04 NOTE — Care Management Important Message (Signed)
 Important Message  Patient Details  Name: [name] MRN: [id number] Date of Birth: 09-13-1956   Important Message Given:  Yes - Medicare IM     Claretta Deed 09/04/2024, 1:56 PM

## 2024-09-04 NOTE — Plan of Care (Signed)

## 2024-09-05 ENCOUNTER — Other Ambulatory Visit (HOSPITAL_COMMUNITY): Payer: Self-pay

## 2024-09-05 DIAGNOSIS — G9340 Encephalopathy, unspecified: Secondary | ICD-10-CM | POA: Diagnosis not present

## 2024-09-05 LAB — CBC WITH DIFFERENTIAL/PLATELET
Abs Immature Granulocytes: 0.01 K/uL (ref 0.00–0.07)
Basophils Absolute: 0 K/uL (ref 0.0–0.1)
Basophils Relative: 0 %
Eosinophils Absolute: 0.2 K/uL (ref 0.0–0.5)
Eosinophils Relative: 3 %
HCT: 37.7 % (ref 36.0–46.0)
Hemoglobin: 12.5 g/dL (ref 12.0–15.0)
Immature Granulocytes: 0 %
Lymphocytes Relative: 20 %
Lymphs Abs: 1 K/uL (ref 0.7–4.0)
MCH: 33.7 pg (ref 26.0–34.0)
MCHC: 33.2 g/dL (ref 30.0–36.0)
MCV: 101.6 fL — ABNORMAL HIGH (ref 80.0–100.0)
Monocytes Absolute: 0.7 K/uL (ref 0.1–1.0)
Monocytes Relative: 14 %
Neutro Abs: 3 K/uL (ref 1.7–7.7)
Neutrophils Relative %: 63 %
Platelets: 220 K/uL (ref 150–400)
RBC: 3.71 MIL/uL — ABNORMAL LOW (ref 3.87–5.11)
RDW: 15.1 % (ref 11.5–15.5)
WBC: 4.9 K/uL (ref 4.0–10.5)
nRBC: 0 % (ref 0.0–0.2)

## 2024-09-05 LAB — BASIC METABOLIC PANEL WITH GFR
Anion gap: 12 (ref 5–15)
BUN: 11 mg/dL (ref 8–23)
CO2: 22 mmol/L (ref 22–32)
Calcium: 8.8 mg/dL — ABNORMAL LOW (ref 8.9–10.3)
Chloride: 105 mmol/L (ref 98–111)
Creatinine, Ser: 0.76 mg/dL (ref 0.44–1.00)
GFR, Estimated: >60 mL/min (ref >60)
Glucose, Bld: 83 mg/dL (ref 70–99)
Potassium: 3.6 mmol/L (ref 3.5–5.1)
Sodium: 139 mmol/L (ref 135–145)

## 2024-09-05 LAB — MAGNESIUM: Magnesium: 1.8 mg/dL (ref 1.7–2.4)

## 2024-09-05 LAB — PHOSPHORUS: Phosphorus: 4.3 mg/dL (ref 2.5–4.6)

## 2024-09-05 MED ORDER — AMLODIPINE BESYLATE 10 MG PO TABS
10.0000 mg | ORAL_TABLET | Freq: Every day | ORAL | 0 refills | Status: DC
  Filled 2024-09-05: qty 30, 30d supply, fill #0

## 2024-09-05 MED ORDER — ACETAMINOPHEN 500 MG PO TABS
500.0000 mg | ORAL_TABLET | Freq: Four times a day (QID) | ORAL | 0 refills | Status: AC | PRN
  Filled 2024-09-05: qty 20, 5d supply, fill #0

## 2024-09-05 MED ORDER — METOPROLOL TARTRATE 50 MG PO TABS
50.0000 mg | ORAL_TABLET | Freq: Two times a day (BID) | ORAL | 0 refills | Status: DC
  Filled 2024-09-05: qty 60, 30d supply, fill #0

## 2024-09-05 MED ORDER — LEVETIRACETAM 1000 MG PO TABS
1000.0000 mg | ORAL_TABLET | Freq: Two times a day (BID) | ORAL | 0 refills | Status: DC
  Filled 2024-09-05: qty 60, 30d supply, fill #0

## 2024-09-05 NOTE — Progress Notes (Signed)
 Explained discharge instructions to patient. Reviewed follow up appointment and next medication administration times. Also reviewed education. Patient verbalized having an understanding for instructions given. All belongings are in the patient's possession to include TOC meds. IV and telemetry were removed by patient's RN. No other needs verbalized. Transported by water quality scientist downstairs to the discharge lounge to await taxi.

## 2024-09-05 NOTE — Plan of Care (Signed)
  Problem: Safety: Goal: Non-violent Restraint(s) Outcome: Adequate for Discharge   Problem: Education: Goal: Knowledge of General Education information will improve Description: Including pain rating scale, medication(s)/side effects and non-pharmacologic comfort measures Outcome: Adequate for Discharge   Problem: Health Behavior/Discharge Planning: Goal: Ability to manage health-related needs will improve Outcome: Adequate for Discharge   Problem: Clinical Measurements: Goal: Ability to maintain clinical measurements within normal limits will improve Outcome: Adequate for Discharge Goal: Will remain free from infection Outcome: Adequate for Discharge Goal: Diagnostic test results will improve Outcome: Adequate for Discharge Goal: Respiratory complications will improve Outcome: Adequate for Discharge Goal: Cardiovascular complication will be avoided Outcome: Adequate for Discharge   Problem: Activity: Goal: Risk for activity intolerance will decrease Outcome: Adequate for Discharge   Problem: Nutrition: Goal: Adequate nutrition will be maintained Outcome: Adequate for Discharge   Problem: Coping: Goal: Level of anxiety will decrease Outcome: Adequate for Discharge   Problem: Elimination: Goal: Will not experience complications related to bowel motility Outcome: Adequate for Discharge Goal: Will not experience complications related to urinary retention Outcome: Adequate for Discharge   Problem: Pain Managment: Goal: General experience of comfort will improve and/or be controlled Outcome: Adequate for Discharge   Problem: Safety: Goal: Ability to remain free from injury will improve Outcome: Adequate for Discharge   Problem: Skin Integrity: Goal: Risk for impaired skin integrity will decrease Outcome: Adequate for Discharge   Problem: Acute Rehab PT Goals(only PT should resolve) Goal: Pt Will Go Supine/Side To Sit Outcome: Adequate for Discharge Goal: Pt Will  Go Sit To Supine/Side Outcome: Adequate for Discharge Goal: Patient Will Transfer Sit To/From Stand Outcome: Adequate for Discharge Goal: Pt Will Transfer Bed To Chair/Chair To Bed Outcome: Adequate for Discharge Goal: Pt Will Ambulate Outcome: Adequate for Discharge   Problem: Acute Rehab OT Goals (only OT should resolve) Goal: Pt. Will Perform Grooming Outcome: Adequate for Discharge Goal: Pt. Will Perform Upper Body Dressing Outcome: Adequate for Discharge Goal: Pt. Will Perform Lower Body Dressing Outcome: Adequate for Discharge Goal: Pt. Will Transfer To Toilet Outcome: Adequate for Discharge
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
## 2024-09-05 NOTE — TOC Transition Note (Addendum)
 Transition of Care [facility]) - Discharge Note   Patient Details  Name: [name] MRN: [id number] Date of Birth: [birth year]  [facility] CM/SW Contact:  Marval Gell, RN Phone Number: 09/05/2024, 9:17 AM   Clinical Narrative:     [name] w patient at bedside. She is alert and oriented x4. Discussed choices and plan for DC.  She understands that due to payor source, home location, and insurance type that [facility] services may not be available. She states that is familiar with using her insurance for transportation and would be able to set up transportation for herself if she is referred to outpatient therapies.  She states that she has 2 canes at her hotel room and would prfere to continue with the cane and not a RW. She was agreeable to 3/1 and would like that delivered through [employer] to her hotel room. I verified her hotel room is  [address]  [place] [address] .  Patient will need cab voucher from [facility].   HH sent out in [facility]- declined over the phone [facility] accepted over the phone, added to HUB and VS     Final next level of care: Home w Home Health Services Barriers to Discharge: No Barriers Identified   Patient Goals and CMS Choice   CMS Medicare.gov Compare Post Acute Care list provided to:: Patient Choice offered to / list presented to : Patient [place] ownership interest in [facility].provided to:: Patient    Discharge Placement                       Discharge Plan and Services Additional resources added to the After Visit Summary for   In-house Referral: Clinical Social Work Discharge Planning Services: CM Consult Post Acute Care Choice: Skilled Nursing Facility                               Social Drivers of Health (SDOH) Interventions SDOH Screenings   Food Insecurity: Food Insecurity Present (08/30/2024)  Housing: High Risk (08/30/2024)  Transportation Needs: Unmet  Transportation Needs (08/30/2024)  Utilities: At Risk (08/30/2024)  Alcohol  Screen: High Risk (06/14/2024)  Depression (PHQ2-9): Medium Risk (05/10/2021)  Financial Resource Strain: High Risk (06/14/2024)  Physical Activity: Inactive (06/14/2024)  Social Connections: Patient Unable To Answer (08/30/2024)  Stress: No Stress Concern Present (09/27/2018)  Tobacco Use: High Risk (08/30/2024)     Readmission Risk Interventions     No data to display

## 2024-09-05 NOTE — Discharge Instructions (Signed)
 Do not drive, operate heavy machinery, perform activities at heights, swimming or participation in water activities or provide baby sitting services until you have seen by Primary MD or a Neurologist and advised to do so again.  Follow with Primary MD Emily Nell SAILOR, FNP in 7 days   Get CBC, CMP, Magnesium , 2 view Chest X ray -  checked next visit with your primary MD    Activity: As tolerated with Full fall precautions use walker/cane & assistance as needed  Disposition Home   Diet: Heart Healthy    Special Instructions: If you have smoked or chewed Tobacco  in the last 2 yrs please stop smoking, stop any regular Alcohol   and or any Recreational drug use.  On your next visit with your primary care physician please Get Medicines reviewed and adjusted.  Please request your Prim.MD to go over all Hospital Tests and Procedure/Radiological results at the follow up, please get all Hospital records sent to your Prim MD by signing hospital release before you go home.  If you experience worsening of your admission symptoms, develop shortness of breath, life threatening emergency, suicidal or homicidal thoughts you must seek medical attention immediately by calling 911 or calling your MD immediately  if symptoms less severe.  You Must read complete instructions/literature along with all the possible adverse reactions/side effects for all the Medicines you take and that have been prescribed to you. Take any new Medicines after you have completely understood and accpet all the possible adverse reactions/side effects.   Do not drive when taking Pain medications.  Do not take more than prescribed Pain, Sleep and Anxiety Medications  Wear Seat belts while driving.

## 2024-09-05 NOTE — Plan of Care (Signed)

## 2024-09-25 ENCOUNTER — Telehealth: Payer: Self-pay | Admitting: Neurology

## 2024-09-25 ENCOUNTER — Encounter: Payer: Self-pay | Admitting: Neurology

## 2024-09-25 NOTE — Telephone Encounter (Signed)
 Pt. Had missed NP appointment due to Sz, no longer in ED and would like to sched appt

## 2024-10-16 ENCOUNTER — Ambulatory Visit: Admitting: Neurology

## 2024-11-01 ENCOUNTER — Inpatient Hospital Stay (HOSPITAL_COMMUNITY)
Admission: EM | Admit: 2024-11-01 | Discharge: 2024-11-06 | DRG: 100 | Disposition: A | Discharge status: Home / Self Care | Attending: Family Medicine | Admitting: Family Medicine

## 2024-11-01 ENCOUNTER — Emergency Department (HOSPITAL_COMMUNITY)

## 2024-11-01 DIAGNOSIS — R569 Unspecified convulsions: Secondary | ICD-10-CM | POA: Diagnosis not present

## 2024-11-01 DIAGNOSIS — N3000 Acute cystitis without hematuria: Secondary | ICD-10-CM

## 2024-11-01 DIAGNOSIS — Z91148 Patient's other noncompliance with medication regimen for other reason: Secondary | ICD-10-CM | POA: Diagnosis not present

## 2024-11-01 DIAGNOSIS — Z90710 Acquired absence of both cervix and uterus: Secondary | ICD-10-CM | POA: Diagnosis not present

## 2024-11-01 DIAGNOSIS — R4701 Aphasia: Secondary | ICD-10-CM | POA: Diagnosis present

## 2024-11-01 DIAGNOSIS — F17210 Nicotine dependence, cigarettes, uncomplicated: Secondary | ICD-10-CM | POA: Diagnosis present

## 2024-11-01 DIAGNOSIS — B9620 Unspecified Escherichia coli [E. coli] as the cause of diseases classified elsewhere: Secondary | ICD-10-CM | POA: Diagnosis present

## 2024-11-01 DIAGNOSIS — Z79899 Other long term (current) drug therapy: Secondary | ICD-10-CM

## 2024-11-01 DIAGNOSIS — N390 Urinary tract infection, site not specified: Secondary | ICD-10-CM | POA: Diagnosis present

## 2024-11-01 DIAGNOSIS — G40901 Epilepsy, unspecified, not intractable, with status epilepticus: Secondary | ICD-10-CM | POA: Diagnosis present

## 2024-11-01 DIAGNOSIS — Z881 Allergy status to other antibiotic agents status: Secondary | ICD-10-CM

## 2024-11-01 DIAGNOSIS — G9341 Metabolic encephalopathy: Secondary | ICD-10-CM | POA: Diagnosis present

## 2024-11-01 DIAGNOSIS — F10239 Alcohol dependence with withdrawal, unspecified: Secondary | ICD-10-CM | POA: Diagnosis present

## 2024-11-01 DIAGNOSIS — F1020 Alcohol dependence, uncomplicated: Secondary | ICD-10-CM | POA: Diagnosis present

## 2024-11-01 DIAGNOSIS — G40909 Epilepsy, unspecified, not intractable, without status epilepticus: Secondary | ICD-10-CM

## 2024-11-01 DIAGNOSIS — I10 Essential (primary) hypertension: Secondary | ICD-10-CM | POA: Diagnosis present

## 2024-11-01 DIAGNOSIS — G9340 Encephalopathy, unspecified: Secondary | ICD-10-CM

## 2024-11-01 DIAGNOSIS — Z888 Allergy status to other drugs, medicaments and biological substances status: Secondary | ICD-10-CM

## 2024-11-01 DIAGNOSIS — Z885 Allergy status to narcotic agent status: Secondary | ICD-10-CM | POA: Diagnosis not present

## 2024-11-01 DIAGNOSIS — E876 Hypokalemia: Secondary | ICD-10-CM | POA: Diagnosis present

## 2024-11-01 DIAGNOSIS — G8929 Other chronic pain: Secondary | ICD-10-CM | POA: Diagnosis present

## 2024-11-01 DIAGNOSIS — W19XXXA Unspecified fall, initial encounter: Secondary | ICD-10-CM

## 2024-11-01 LAB — URINALYSIS, W/ REFLEX TO CULTURE (INFECTION SUSPECTED)
Bilirubin Urine: NEGATIVE
Glucose, UA: NEGATIVE mg/dL
Ketones, ur: 20 mg/dL — AB
Nitrite: POSITIVE — AB
Protein, ur: 100 mg/dL — AB
Specific Gravity, Urine: 1.012 (ref 1.005–1.030)
WBC, UA: >50 WBC/hpf (ref 0–5)
pH: 5 (ref 5.0–8.0)

## 2024-11-01 LAB — I-STAT CG4 LACTIC ACID, ED: Lactic Acid, Venous: >15 mmol/L (ref 0.5–1.9)

## 2024-11-01 LAB — CBC WITH DIFFERENTIAL/PLATELET
Abs Immature Granulocytes: 0.08 K/uL — ABNORMAL HIGH (ref 0.00–0.07)
Basophils Absolute: 0 K/uL (ref 0.0–0.1)
Basophils Relative: 1 %
Eosinophils Absolute: 0 K/uL (ref 0.0–0.5)
Eosinophils Relative: 0 %
HCT: 42 % (ref 36.0–46.0)
Hemoglobin: 13.9 g/dL (ref 12.0–15.0)
Immature Granulocytes: 1 %
Lymphocytes Relative: 14 %
Lymphs Abs: 0.8 K/uL (ref 0.7–4.0)
MCH: 34.6 pg — ABNORMAL HIGH (ref 26.0–34.0)
MCHC: 33.1 g/dL (ref 30.0–36.0)
MCV: 104.5 fL — ABNORMAL HIGH (ref 80.0–100.0)
Monocytes Absolute: 0.3 K/uL (ref 0.1–1.0)
Monocytes Relative: 5 %
Neutro Abs: 4.6 K/uL (ref 1.7–7.7)
Neutrophils Relative %: 79 %
Platelets: 110 K/uL — ABNORMAL LOW (ref 150–400)
RBC: 4.02 MIL/uL (ref 3.87–5.11)
RDW: 16.4 % — ABNORMAL HIGH (ref 11.5–15.5)
WBC: 5.8 K/uL (ref 4.0–10.5)
nRBC: 0.3 % — ABNORMAL HIGH (ref 0.0–0.2)

## 2024-11-01 LAB — I-STAT CHEM 8, ED
BUN: <3 mg/dL — ABNORMAL LOW (ref 8–23)
Calcium, Ion: 0.97 mmol/L — ABNORMAL LOW (ref 1.15–1.40)
Chloride: 96 mmol/L — ABNORMAL LOW (ref 98–111)
Creatinine, Ser: 0.6 mg/dL (ref 0.44–1.00)
Glucose, Bld: 146 mg/dL — ABNORMAL HIGH (ref 70–99)
HCT: 47 % — ABNORMAL HIGH (ref 36.0–46.0)
Hemoglobin: 16 g/dL — ABNORMAL HIGH (ref 12.0–15.0)
Potassium: 2.9 mmol/L — ABNORMAL LOW (ref 3.5–5.1)
Sodium: 135 mmol/L (ref 135–145)
TCO2: 14 mmol/L — ABNORMAL LOW (ref 22–32)

## 2024-11-01 LAB — URINE DRUG SCREEN
Amphetamines: NEGATIVE
Barbiturates: NEGATIVE
Benzodiazepines: NEGATIVE
Cocaine: NEGATIVE
Fentanyl: NEGATIVE
Methadone Scn, Ur: NEGATIVE
Opiates: NEGATIVE
Tetrahydrocannabinol: NEGATIVE

## 2024-11-01 LAB — COMPREHENSIVE METABOLIC PANEL WITH GFR
ALT: 25 U/L (ref 0–44)
AST: 61 U/L — ABNORMAL HIGH (ref 15–41)
Albumin: 4.7 g/dL (ref 3.5–5.0)
Alkaline Phosphatase: 129 U/L — ABNORMAL HIGH (ref 38–126)
Anion gap: 40 — ABNORMAL HIGH (ref 5–15)
BUN: <5 mg/dL — ABNORMAL LOW (ref 8–23)
CO2: 10 mmol/L — ABNORMAL LOW (ref 22–32)
Calcium: 8.7 mg/dL — ABNORMAL LOW (ref 8.9–10.3)
Chloride: 88 mmol/L — ABNORMAL LOW (ref 98–111)
Creatinine, Ser: 0.67 mg/dL (ref 0.44–1.00)
GFR, Estimated: >60 mL/min
Glucose, Bld: 147 mg/dL — ABNORMAL HIGH (ref 70–99)
Potassium: 3 mmol/L — ABNORMAL LOW (ref 3.5–5.1)
Sodium: 138 mmol/L (ref 135–145)
Total Bilirubin: 1.1 mg/dL (ref 0.0–1.2)
Total Protein: 8.1 g/dL (ref 6.5–8.1)

## 2024-11-01 LAB — MAGNESIUM: Magnesium: 1.7 mg/dL (ref 1.7–2.4)

## 2024-11-01 LAB — CBG MONITORING, ED: Glucose-Capillary: 154 mg/dL — ABNORMAL HIGH (ref 70–99)

## 2024-11-01 MED ORDER — SODIUM CHLORIDE 0.9% FLUSH
3.0000 mL | Freq: Two times a day (BID) | INTRAVENOUS | Status: DC
  Administered 2024-11-02 – 2024-11-06 (×10): 3 mL via INTRAVENOUS

## 2024-11-01 MED ORDER — BISACODYL 5 MG PO TBEC: 5.0000 mg | DELAYED_RELEASE_TABLET | Freq: Every day | ORAL | Status: DC | PRN

## 2024-11-01 MED ORDER — LEVOFLOXACIN IN D5W 750 MG/150ML IV SOLN
750.0000 mg | INTRAVENOUS | Status: DC
  Administered 2024-11-01: 750 mg via INTRAVENOUS
  Filled 2024-11-01: qty 150

## 2024-11-01 MED ORDER — LORAZEPAM 1 MG PO TABS
1.0000 mg | ORAL_TABLET | ORAL | Status: AC | PRN
  Administered 2024-11-03: 1 mg via ORAL
  Filled 2024-11-01: qty 1

## 2024-11-01 MED ORDER — LEVETIRACETAM (KEPPRA) 500 MG/5 ML ADULT IV PUSH
60.0000 mg/kg | Freq: Once | INTRAVENOUS | Status: AC
  Administered 2024-11-01: 4000 mg via INTRAVENOUS
  Filled 2024-11-01: qty 40

## 2024-11-01 MED ORDER — LACTATED RINGERS IV BOLUS
1000.0000 mL | Freq: Once | INTRAVENOUS | Status: AC
  Administered 2024-11-01: 1000 mL via INTRAVENOUS

## 2024-11-01 MED ORDER — ACETAMINOPHEN 500 MG PO TABS
500.0000 mg | ORAL_TABLET | Freq: Two times a day (BID) | ORAL | Status: DC | PRN
  Administered 2024-11-05 – 2024-11-06 (×2): 500 mg via ORAL
  Filled 2024-11-01 (×2): qty 1

## 2024-11-01 MED ORDER — POTASSIUM CHLORIDE 10 MEQ/100ML IV SOLN
10.0000 meq | INTRAVENOUS | Status: AC
  Administered 2024-11-01: 10 meq via INTRAVENOUS
  Filled 2024-11-01: qty 100

## 2024-11-01 MED ORDER — ADULT MULTIVITAMIN W/MINERALS CH
1.0000 | ORAL_TABLET | Freq: Every day | ORAL | Status: DC
  Administered 2024-11-02 – 2024-11-06 (×5): 1 via ORAL
  Filled 2024-11-01 (×5): qty 1

## 2024-11-01 MED ORDER — SODIUM CHLORIDE 0.9 % IV SOLN
1.0000 g | Freq: Once | INTRAVENOUS | Status: DC
  Filled 2024-11-01: qty 10

## 2024-11-01 MED ORDER — THIAMINE HCL 100 MG/ML IJ SOLN: 100.0000 mg | Freq: Every day | INTRAMUSCULAR | Status: DC

## 2024-11-01 MED ORDER — ONDANSETRON HCL 4 MG PO TABS: 4.0000 mg | ORAL_TABLET | Freq: Four times a day (QID) | ORAL | Status: DC | PRN

## 2024-11-01 MED ORDER — ACETAMINOPHEN 650 MG RE SUPP: 650.0000 mg | Freq: Four times a day (QID) | RECTAL | Status: DC | PRN

## 2024-11-01 MED ORDER — FOLIC ACID 1 MG PO TABS
1.0000 mg | ORAL_TABLET | Freq: Every day | ORAL | Status: DC
  Administered 2024-11-02 – 2024-11-06 (×5): 1 mg via ORAL
  Filled 2024-11-01 (×5): qty 1

## 2024-11-01 MED ORDER — POTASSIUM CHLORIDE 10 MEQ/100ML IV SOLN
10.0000 meq | INTRAVENOUS | Status: AC
  Filled 2024-11-01: qty 100

## 2024-11-01 MED ORDER — LORAZEPAM 2 MG/ML IJ SOLN
INTRAMUSCULAR | Status: AC
  Administered 2024-11-01: 2 mg
  Filled 2024-11-01: qty 1

## 2024-11-01 MED ORDER — THIAMINE HCL 100 MG/ML IJ SOLN
250.0000 mg | Freq: Every day | INTRAVENOUS | Status: DC
  Administered 2024-11-03 – 2024-11-06 (×4): 250 mg via INTRAVENOUS
  Filled 2024-11-01 (×4): qty 2.5

## 2024-11-01 MED ORDER — ONDANSETRON HCL 4 MG/2ML IJ SOLN: 4.0000 mg | Freq: Four times a day (QID) | INTRAMUSCULAR | Status: DC | PRN

## 2024-11-01 MED ORDER — THIAMINE HCL 100 MG/ML IJ SOLN
500.0000 mg | Freq: Three times a day (TID) | INTRAVENOUS | Status: AC
  Administered 2024-11-01 – 2024-11-03 (×6): 500 mg via INTRAVENOUS
  Filled 2024-11-01: qty 480
  Filled 2024-11-01: qty 500
  Filled 2024-11-01 (×2): qty 5
  Filled 2024-11-01: qty 480
  Filled 2024-11-01: qty 500
  Filled 2024-11-01: qty 5

## 2024-11-01 MED ORDER — LORAZEPAM 2 MG/ML IJ SOLN
1.0000 mg | INTRAMUSCULAR | Status: AC | PRN
  Administered 2024-11-04: 2 mg via INTRAVENOUS
  Filled 2024-11-01: qty 1

## 2024-11-01 NOTE — ED Notes (Signed)
 Pt began to seize during provider assesment RN terra called to bedside. See mar actions. Djibril Glogowski RN called to bedside.

## 2024-11-01 NOTE — Significant Event (Addendum)
 Hospitalist cross cover  I was just informed that this post ictal very encephalopathic patient fell and hit the floor for a second time. ( The first time was 3 hours ago)  She already had mit restraints on and has one on one supervision. The fall was witnessed. She is right beside the nursing station.  Plan: - Consider Posey or some other restraint. She is having loose stool which may be prompting here to try to get out of bed. - The ED doctor examined her and did not feel that another Head CT and C spine CT were needed.

## 2024-11-01 NOTE — H&P (Signed)
 " History and Physical    Patient: [name] FMW:[id number] DOB: [birth year] DOA: 11/01/2024 DOS: the patient was seen and examined on 11/01/2024 PCP: Lenon Nell SAILOR, FNP  Patient coming from: Home  Chief Complaint: No chief complaint on file.  HPI: [name] is a 69 y.o. female with medical history significant seizure disorder and seizure medication noncompliance as well as alcohol  dependence and alcohol  related seizures. She was last admitted 2 months ago with seizures and prolonged postictal phase. All of my history comes from the patient's medical record.  Apparently her significant other called EMS because of a prolonged seizure lasting at least 7 minutes at home.  The patient did have a seizure witnessed by EMS and another 1 here in the emergency department. The patient has not had any further seizures since being loaded with Keppra  and given Ativan .  Neurology is following.  At approximately 5:20 PM the patient was found on the floor having stool and urine all around her.  After this unwitnessed fall she did have a CT of her C-spine as well as head which did not reveal any intracranial bleed or significant trauma. The patient has remained encephalopathic but no additional changes. She is currently on continuous seizure monitoring and will be admitted to the hospitalist service. Workup in the emergency department included blood work that showed a normal sodium.  Potassium 2.9.  Creatinine 0.6.  Her lactic acid was elevated at greater than 15.  Her alk phos was elevated at 129 but T. bili was 1.1 AST 61.  ALT 25.  Magnesium  level 1.7.  Her white blood cell count was normal at 5.8 hemoglobin initially 13.9.  Urinalysis did reveal positive nitrate and large leukocytes.   Review of Systems: unable to review all systems due to the inability of the patient to answer questions. Past Medical History:  Diagnosis Date   Alcohol  dependence (HCC)    Anxiety    Chronic pain     Depression    Diverticulitis    Herniated cervical disc    Pancreatitis    Seizures (HCC)    alcoholic seizures   Past Surgical History:  Procedure Laterality Date   ABDOMINAL HYSTERECTOMY     ANKLE SURGERY Right    CARPAL TUNNEL RELEASE Bilateral    CERVICAL FUSION     CHOLECYSTECTOMY     KNEE SURGERY     Social History:  reports that she has been smoking cigarettes. She has a 1.8 pack-year smoking history. She has been exposed to tobacco smoke. She has never used smokeless tobacco. She reports current alcohol  use. She reports that she does not currently use drugs after having used the following drugs: Marijuana.  Allergies[1]  Family History  Family history unknown: Yes    Prior to Admission medications  Medication Sig Start Date End Date Taking? Authorizing Provider  acetaminophen  (TYLENOL ) 500 MG tablet Take 1 tablet (500 mg total) by mouth every 6 (six) hours as needed for moderate pain (pain score 4-6) or mild pain (pain score 1-3). 09/05/24   Dennise Lavada POUR, MD  amLODipine  (NORVASC ) 10 MG tablet Take 1 tablet (10 mg total) by mouth daily. 09/05/24   Dennise Lavada POUR, MD  cloBAZam  (ONFI ) 10 MG tablet Take 0.5 tablets (5 mg total) by mouth at bedtime. 06/15/24   Amin, Ankit C, MD  cyanocobalamin  1000 MCG tablet Take 1 tablet (1,000 mcg total) by mouth daily. 09/28/23   Singh, Prashant K, MD  diazePAM , 15 MG Dose, 2  x 7.5 MG/0.1ML LQPK Place 15 mg into the nose as needed. 06/15/24   Amin, Ankit C, MD  DULoxetine  (CYMBALTA ) 30 MG capsule Take 1 capsule (30 mg total) by mouth daily. 01/20/24 02/19/24  Vernon Ranks, MD  erythromycin  ophthalmic ointment Place a 1/2 inch ribbon of ointment into the lower eyelid. 04/01/24   Charlyn Sora, MD  folic acid  (FOLVITE ) 1 MG tablet Take 1 tablet (1 mg total) by mouth daily. 09/28/23   Singh, Prashant K, MD  gabapentin  (NEURONTIN ) 300 MG capsule Take 1 capsule (300 mg total) by mouth 3 (three) times daily. 10/23/23   Haviland, Julie, MD   levETIRAcetam  (KEPPRA ) 1000 MG tablet Take 1 tablet (1,000 mg total) by mouth 2 (two) times daily. 09/05/24   Singh, Prashant K, MD  menthol -cetylpyridinium (CEPACOL) 3 MG lozenge Take 1 lozenge (3 mg total) by mouth as needed for sore throat. 09/28/23   Singh, Prashant K, MD  metoprolol  tartrate (LOPRESSOR ) 50 MG tablet Take 1 tablet (50 mg total) by mouth 2 (two) times daily. 09/05/24   Dennise Lavada POUR, MD  pantoprazole  (PROTONIX ) 40 MG tablet Take 1 tablet (40 mg total) by mouth daily. 01/20/24 03/20/24  Pahwani, Ravi, MD  polyethylene glycol powder (GLYCOLAX /MIRALAX ) 17 GM/SCOOP powder Take 17 g by mouth daily as needed for moderate constipation. 06/15/24   Amin, Ankit C, MD  potassium chloride  SA (KLOR-CON  M) 20 MEQ tablet Take 1 tablet (20 mEq total) by mouth 2 (two) times daily. 08/22/24   Mannie Fairy DASEN, DO  risperiDONE  (RISPERDAL ) 0.5 MG tablet Take 1 tablet (0.5 mg total) by mouth 2 (two) times daily. 01/20/24 02/19/24  Vernon Ranks, MD  thiamine  (VITAMIN B1) 100 MG tablet Take 1 tablet (100 mg total) by mouth daily. 09/28/23   Singh, Prashant K, MD  metoCLOPramide  (REGLAN ) 10 MG tablet Take 1 tablet (10 mg total) by mouth every 6 (six) hours as needed for nausea (nausea/headache). 04/06/19 05/24/19  Mesner, Selinda, MD    Physical Exam: Vitals:   11/01/24 1445 11/01/24 1500 11/01/24 1530 11/01/24 1700  BP:    118/86  Pulse: (!) 59 (!) 55 64   Resp:    18  Temp:      TempSrc:      SpO2:       Physical Exam:  General: pale, disoriented, disheveled HEENT: Normocephalic, atraumatic, PERRL Cardiovascular: Normal rate and rhythm. Pulmonary: Normal pulmonary effort, normal breath sounds Gastrointestinal: Nondistended abdomen, soft, non-tender, normoactive bowel sounds Musculoskeletal:Normal ROM, no lower ext edema Skin: Skin is warm and dry. Neuro: When I ask a question the patient just stares at me.  She answers occasionally but does not seem to follow what I am saying.  I asked her if  she wanted something to drink she eventually said a Coke.  When I brought her up cup and put it to her mouth she did not drink even with a straw she was not able to suck.  When I removed the cup from her lips she motion to her hands as if she had a cup and it and tilted her head back as if she were drinking but there was no cup in her hand. She does not follow any commands.  PSYCH: Confused  Data Reviewed:  Results for orders placed or performed during the hospital encounter of 11/01/24 (from the past 24 hours)  CBG monitoring, ED     Status: Abnormal   Collection Time: 11/01/24 11:35 AM  Result Value Ref Range   Glucose-Capillary  154 (H) 70 - 99 mg/dL  CBC with Differential     Status: Abnormal   Collection Time: 11/01/24 11:38 AM  Result Value Ref Range   WBC 5.8 4.0 - 10.5 K/uL   RBC 4.02 3.87 - 5.11 MIL/uL   Hemoglobin 13.9 12.0 - 15.0 g/dL   HCT 57.9 63.9 - 53.9 %   MCV 104.5 (H) 80.0 - 100.0 fL   MCH 34.6 (H) 26.0 - 34.0 pg   MCHC 33.1 30.0 - 36.0 g/dL   RDW 83.5 (H) 88.4 - 84.4 %   Platelets 110 (L) 150 - 400 K/uL   nRBC 0.3 (H) 0.0 - 0.2 %   Neutrophils Relative % 79 %   Neutro Abs 4.6 1.7 - 7.7 K/uL   Lymphocytes Relative 14 %   Lymphs Abs 0.8 0.7 - 4.0 K/uL   Monocytes Relative 5 %   Monocytes Absolute 0.3 0.1 - 1.0 K/uL   Eosinophils Relative 0 %   Eosinophils Absolute 0.0 0.0 - 0.5 K/uL   Basophils Relative 1 %   Basophils Absolute 0.0 0.0 - 0.1 K/uL   Immature Granulocytes 1 %   Abs Immature Granulocytes 0.08 (H) 0.00 - 0.07 K/uL  Comprehensive metabolic panel     Status: Abnormal   Collection Time: 11/01/24 11:38 AM  Result Value Ref Range   Sodium 138 135 - 145 mmol/L   Potassium 3.0 (L) 3.5 - 5.1 mmol/L   Chloride 88 (L) 98 - 111 mmol/L   CO2 10 (L) 22 - 32 mmol/L   Glucose, Bld 147 (H) 70 - 99 mg/dL   BUN <5 (L) 8 - 23 mg/dL   Creatinine, Ser 9.32 0.44 - 1.00 mg/dL   Calcium  8.7 (L) 8.9 - 10.3 mg/dL   Total Protein 8.1 6.5 - 8.1 g/dL   Albumin 4.7 3.5  - 5.0 g/dL   AST 61 (H) 15 - 41 U/L   ALT 25 0 - 44 U/L   Alkaline Phosphatase 129 (H) 38 - 126 U/L   Total Bilirubin 1.1 0.0 - 1.2 mg/dL   GFR, Estimated >39 >39 mL/min   Anion gap 40 (H) 5 - 15  Magnesium      Status: None   Collection Time: 11/01/24 11:38 AM  Result Value Ref Range   Magnesium  1.7 1.7 - 2.4 mg/dL  Urine Drug Screen     Status: None   Collection Time: 11/01/24 11:47 AM  Result Value Ref Range   Opiates NEGATIVE NEGATIVE   Cocaine NEGATIVE NEGATIVE   Benzodiazepines NEGATIVE NEGATIVE   Amphetamines NEGATIVE NEGATIVE   Tetrahydrocannabinol NEGATIVE NEGATIVE   Barbiturates NEGATIVE NEGATIVE   Methadone Scn, Ur NEGATIVE NEGATIVE   Fentanyl  NEGATIVE NEGATIVE  Urinalysis, w/ Reflex to Culture (Infection Suspected) -Urine, Clean Catch     Status: Abnormal   Collection Time: 11/01/24 11:47 AM  Result Value Ref Range   Specimen Source URINE, CLEAN CATCH    Color, Urine YELLOW YELLOW   APPearance CLOUDY (A) CLEAR   Specific Gravity, Urine 1.012 1.005 - 1.030   pH 5.0 5.0 - 8.0   Glucose, UA NEGATIVE NEGATIVE mg/dL   Hgb urine dipstick MODERATE (A) NEGATIVE   Bilirubin Urine NEGATIVE NEGATIVE   Ketones, ur 20 (A) NEGATIVE mg/dL   Protein, ur 899 (A) NEGATIVE mg/dL   Nitrite POSITIVE (A) NEGATIVE   Leukocytes,Ua LARGE (A) NEGATIVE   RBC / HPF 6-10 0 - 5 RBC/hpf   WBC, UA >50 0 - 5 WBC/hpf   Bacteria, UA FEW (  A) NONE SEEN   Squamous Epithelial / HPF 0-5 0 - 5 /HPF   WBC Clumps PRESENT    Mucus PRESENT    Hyaline Casts, UA PRESENT   Urine Culture     Status: None (Preliminary result)   Collection Time: 11/01/24 11:47 AM   Specimen: Urine, Catheterized  Result Value Ref Range   Specimen Description URINE, CATHETERIZED    Special Requests      NONE Reflexed from [id number] Performed at [facility], [address], [place], [place] [address]    Culture PENDING    Report Status PENDING   I-Stat CG4 Lactic Acid     Status: Abnormal   Collection Time: 11/01/24  12:08 PM  Result Value Ref Range   Lactic Acid, Venous >15.0 (HH) 0.5 - 1.9 mmol/L   Comment NOTIFIED PHYSICIAN   I-stat chem 8, ED (not at [facility], [facility] or [facility])     Status: Abnormal   Collection Time: 11/01/24 12:09 PM  Result Value Ref Range   Sodium 135 135 - 145 mmol/L   Potassium 2.9 (L) 3.5 - 5.1 mmol/L   Chloride 96 (L) 98 - 111 mmol/L   BUN <3 (L) 8 - 23 mg/dL   Creatinine, Ser 9.39 0.44 - 1.00 mg/dL   Glucose, Bld 853 (H) 70 - 99 mg/dL   Calcium , Ion 0.97 (L) 1.15 - 1.40 mmol/L   TCO2 14 (L) 22 - 32 mmol/L   Hemoglobin 16.0 (H) 12.0 - 15.0 g/dL   HCT 52.9 (H) 63.9 - 53.9 %     Assessment and Plan: Status epilepticus - now post ictal . She is hooked up to continuous seizure monitoring at this time. - Appreciate neurology management and recommendations - She has been loaded with Keppra  - Patient needs close monitoring at this time especially given her earlier fall from bed.  She will be admitted to the progressive care unit.  2.  History of alcohol  dependence and alcohol  withdrawal seizures - It is unclear if that is playing a role in this. - Will start CIWA protocol - She is not tachycardic or shaky.  3.  Severe hypokalemia  - Replete  4. UTI - IV Levaquin  ordered given her cephalosporin allergy.  5. Bedbugs reported by EMS from where she was picked up.    Advance Care Planning:   Code Status: Full Code  The patient is encephalopathic and unable to participate in this conversation.  She will be full code by default.  Consults: Neurology  Family Communication: none  Severity of Illness: The appropriate patient status for this patient is INPATIENT. Inpatient status is judged to be reasonable and necessary in order to provide the required intensity of service to ensure the patient's safety. The patient's presenting symptoms, physical exam findings, and initial radiographic and laboratory data in the context of their chronic comorbidities is felt to place them at high  risk for further clinical deterioration. Furthermore, it is not anticipated that the patient will be medically stable for discharge from the hospital within 2 midnights of admission.   * I certify that at the point of admission it is my clinical judgment that the patient will require inpatient hospital care spanning beyond 2 midnights from the point of admission due to high intensity of service, high risk for further deterioration and high frequency of surveillance required.*  Author: ARTHEA CHILD, MD 11/01/2024 7:32 PM  For on call review [email or web address].     [1]  Allergies Allergen Reactions  Keflex  [Cephalexin ] Hives   Cephalosporins Hives   Prednisone  Swelling   Toradol  [Ketorolac  Tromethamine ] Hives   Ultram  [Tramadol ] Hives   "

## 2024-11-01 NOTE — ED Provider Notes (Signed)
 Care assumed from Dr. Dreama.  At time of transfer of care, patient is awaiting CT head imaging, neurology consultation, and admission with concern for recurrent seizures without return to baseline.  Previous team suspects is similar to episodes in the past.  They did find a UTI and Rocephin  was ordered.  5:20 PM Nursing informed me that the patient was found on the floor having had stool and urine all over the floor.  We were able to get the patient back in the bed and she tells me she feels well and has no pain.  I do not see significant bruising on her head or torso initially.  Lungs are clear.  Patient is now resting.  I went to discuss with the CT imaging team and was informed that they are waiting on the patient to have decontamination process performed so that she can get her CT imaging.  It is unclear where on the process of decontamination however with the new fall of the bed with the patient, I will add a CT of the neck and will see if we can expedite CT imaging of her head and neck and I will contact neurology as well.  \  She will need admission.   Neurology was informed of this fall and CT team will be able to accommodate getting expedient CT imaging.  Will get the CT scan and anticipate admission.  6:22 PM CT of the head and neck returned reassuring without evidence of intracranial bleed.  No evidence of acute trauma from the fall.  She is still on LTM and neurology requested medical admission so we will call for medicine admit.   Zakyah Yanes, Lonni PARAS, MD 11/01/24 [phone]

## 2024-11-01 NOTE — Progress Notes (Signed)
 LTM EEG hooked up and running - no initial skin breakdown - push button tested - unmonitored at this time patient is in the ED.  CT leads used.

## 2024-11-01 NOTE — ED Triage Notes (Signed)
 According to [facility]: Husband reports witnessing multiple seizures lasting 7 minutes, she does have a history of seizure and is noncompliant with medication. Dried blood around pt mouth but pt is unable or unwilling ot open mouth long enough for ems to see.  Vitals: 152/90 HR 114 Spo 95 CBG 130

## 2024-11-01 NOTE — ED Notes (Signed)
 Multiple Nursing staff unable to obtain labs, phlebotomy asked to stick.

## 2024-11-01 NOTE — Progress Notes (Signed)
 Bruises and scattered scratches noted on chest/arms. Seizure pads added to bed

## 2024-11-01 NOTE — ED Notes (Signed)
 Pt is unable or unwilling to communicate at this time for triage questions.

## 2024-11-01 NOTE — Consult Note (Signed)
 NEUROLOGY CONSULT NOTE   Date of service: November 01, 2024 Patient Name: [name] MRN:  [id number] DOB:  [birth year] Chief Complaint: seizures  Requesting Provider: Dreama Longs, MD  History of Present Illness  [name] is a 69 y.o. female with hx of noncompliance with medications,  ETOH abuse, chronic pain, seizures on Keppra  and Onfi  and gabapentin , anxiety and depression, pancreatitis who presents from home via EMS for having multiple seizures.  She was given 4000mg  IV Keppra  and 2 mg IV ativan . She does have dried blood around her mouth, unable to visualize the inside of her mouth to examine as she is not cooperative with exam at the moment  She is well known to the neurology service and is noted to be noncomplaint with medications. UDS is negative, Ethanol level is pending, K 3.0, UA positive for UTI. Keppra  level pending   On exam, no family at the bedside. Patient is drowsy awakens to noxious stimuli, not following commands, garbled incomprehensible speech, moving all 4 extremities.     ROS   Comprehensive ROS Unable to ascertain due to AMS  Past History   Past Medical History:  Diagnosis Date   Alcohol  dependence (HCC)    Anxiety    Chronic pain    Depression    Diverticulitis    Herniated cervical disc    Pancreatitis    Seizures (HCC)    alcoholic seizures    Past Surgical History:  Procedure Laterality Date   ABDOMINAL HYSTERECTOMY     ANKLE SURGERY Right    CARPAL TUNNEL RELEASE Bilateral    CERVICAL FUSION     CHOLECYSTECTOMY     KNEE SURGERY      Family History: Family History  Family history unknown: Yes    Social History  reports that she has been smoking cigarettes. She has a 1.8 pack-year smoking history. She has been exposed to tobacco smoke. She has never used smokeless tobacco. She reports current alcohol  use. She reports that she does not currently use drugs after having used the following drugs:  Marijuana.  Allergies[1]  Medications  Current Medications[2]  Vitals   Vitals:   11/01/24 1130 11/01/24 1145 11/01/24 1200 11/01/24 1300  BP: (!) 159/101 (!) 163/90  (!) 137/93  Pulse: (!) 117 (!) 122  93  Resp: 16 18  (!) 27  Temp:   97.6 F (36.4 C)   TempSrc:   Axillary   SpO2: 98% 100%  98%    There is no height or weight on file to calculate BMI.   Physical Exam   Constitutional: Appears well-developed and well-nourished.   Psych: Affect appropriate to situation.   Eyes: No scleral injection.   HENT: No OP obstruction.   Head: Normocephalic.   Cardiovascular: Normal rate and regular rhythm.   Respiratory: Effort normal, non-labored breathing.   GI: Soft.  No distension. There is no tenderness.   Skin: WDI.    Neurologic Examination   Mental Status -  Drowsy, responds to noxious stimuli, not following commands   Cranial Nerves II - XII - II - Visual field intact inconsistent blink to threat bilaterally  III, IV, VI - Extraocular movements intact, tracking V - Facial sensation intact bilaterally . VII - Facial movement intact bilaterally . VIII - Hearing & vestibular intact bilaterally . X - XII unable to assess   Motor Strength - appears to be moving all 4 extremities equal and spontaneously   Bulk was normal and fasciculations were absent .  Motor Tone - Muscle tone was assessed at the neck and appendages and was normal . Sensory - withdraws to noxious stimuli  Coordination - unable to assess   Gait and Station - deferred.  Labs/Imaging/Neurodiagnostic studies   CBC:  Recent Labs  Lab 11-19-24 1138 2024/11/19 1209  WBC 5.8  --   NEUTROABS 4.6  --   HGB 13.9 16.0*  HCT 42.0 47.0*  MCV 104.5*  --   PLT 110*  --    Basic Metabolic Panel:  Lab Results  Component Value Date   NA 135 2024-11-19   K 2.9 (L) 2024-11-19   CO2 10 (L) 11/19/2024   GLUCOSE 146 (H) 19-Nov-2024   BUN <3 (L) 11-19-2024   CREATININE 0.60 11-19-2024   CALCIUM  8.7 (L)  11-19-2024   GFRNONAA >60 11-19-24   GFRAA >60 06/09/2020   Lipid Panel:  Lab Results  Component Value Date   LDLCALC 58 06/09/2020   HgbA1c:  Lab Results  Component Value Date   HGBA1C 4.5 (L) 06/11/2024   Urine Drug Screen:     Component Value Date/Time   LABOPIA NEGATIVE 19-Nov-2024 1147   COCAINSCRNUR NEGATIVE 11/19/24 1147   LABBENZ NEGATIVE 2024/11/19 1147   AMPHETMU NEGATIVE 11-19-24 1147   THCU NEGATIVE 11-19-24 1147   LABBARB NEGATIVE November 19, 2024 1147    Alcohol  Level     Component Value Date/Time   ETH <15 08/29/2024 1833   INR  Lab Results  Component Value Date   INR 1.0 05/17/2022   APTT  Lab Results  Component Value Date   APTT 22 (L) 05/17/2022   AED levels:  Lab Results  Component Value Date   PHENYTOIN  <2.5 (L) 10/17/2022   LEVETIRACETA 168.0 (H) 06/11/2024    CT Head without contrast(Personally reviewed):  ordered  Neurodiagnostics C EEG:  Ordered   ASSESSMENT   [name] is a 69 y.o. female   hx of noncompliance with medications,  ETOH abuse, chronic pain, seizures on Keppra  and Onfi  and gabapentin , anxiety and depression, pancreatitis who presents from home via EMS for having multiple seizures.  She was given 4000mg  IV Keppra  and 2 mg IV ativan . She does have dried blood around her mouth, unable to visualize the inside of her mouth to examine as she is not cooperative with exam at the moment   Likely breakthrough seizures in the setting of UTI and possible non compliance with medications   RECOMMENDATIONS  - Seizure precautions  - cEEG to evaluate for seizures  - CT head w/o to evaluate for acute process.  - continue home Keppra  1000mg  BID IV  - If able to swallow continue home gabapentin  and onfi , may require NG tube if unable to take pos  - Neurology will continue to follow  ______________________________________________________________________    Signed, Karna DELENA Geralds, NP [facility]  NEUROHOSPITALIST ADDENDUM Performed a face to face diagnostic evaluation.   I have reviewed the contents of history and physical exam as documented by PA/ARNP/Resident and agree with above documentation.  I have discussed and formulated the above plan as documented. Edits to the note have been made as needed.  Impression/Key exam findings/Plan: well known to our service. She has a hx of seizure, epilepsy with independt right and left sided epileptiforn discharges on prior EEGs. She drinks EtOh and despite repeated conversations about medication compliance, she does not take her AEDs. She had a prolonged seizure and is now encephalopathic with aphasia out of proportion to encephalopathy noted on exam  today. This was also noted back in Nov of 2025 when I saw her.  Concern for subclinical seizure vs post ictal aphasia.  Will put her up on LTM EEG  Plan to get CT Head  Will load her up with Keppra   Will have to reinterate importance of compliance with AEDs when she is improved.  Belisa Eichholz, MD [facility] [phone]   If 7pm to 7am, please call on call as listed on AMION.      [1]  Allergies Allergen Reactions   Keflex  [Cephalexin ] Hives   Cephalosporins Hives   Prednisone  Swelling   Toradol  [Ketorolac  Tromethamine ] Hives   Ultram  [Tramadol ] Hives  [2] No current facility-administered medications for this encounter.  Current Outpatient Medications:    acetaminophen  (TYLENOL ) 500 MG tablet, Take 1 tablet (500 mg total) by mouth every 6 (six) hours as needed for moderate pain (pain score 4-6) or mild pain (pain score 1-3)., Disp: 20 tablet, Rfl: 0   amLODipine  (NORVASC ) 10 MG tablet, Take 1 tablet (10 mg total) by mouth daily., Disp: 30 tablet, Rfl: 0   cloBAZam  (ONFI ) 10 MG tablet, Take 0.5 tablets (5 mg total) by mouth at bedtime., Disp: 15 tablet, Rfl: 0   cyanocobalamin  1000 MCG tablet, Take 1 tablet (1,000 mcg total) by mouth daily.,  Disp: 30 tablet, Rfl: 2   diazePAM , 15 MG Dose, 2 x 7.5 MG/0.1ML LQPK, Place 15 mg into the nose as needed., Disp: 5 each, Rfl: 5   DULoxetine  (CYMBALTA ) 30 MG capsule, Take 1 capsule (30 mg total) by mouth daily., Disp: 30 capsule, Rfl: 0   erythromycin  ophthalmic ointment, Place a 1/2 inch ribbon of ointment into the lower eyelid., Disp: 3.5 g, Rfl: 0   folic acid  (FOLVITE ) 1 MG tablet, Take 1 tablet (1 mg total) by mouth daily., Disp: 30 tablet, Rfl: 2   gabapentin  (NEURONTIN ) 300 MG capsule, Take 1 capsule (300 mg total) by mouth 3 (three) times daily., Disp: 90 capsule, Rfl: 0   levETIRAcetam  (KEPPRA ) 1000 MG tablet, Take 1 tablet (1,000 mg total) by mouth 2 (two) times daily., Disp: 60 tablet, Rfl: 0   menthol -cetylpyridinium (CEPACOL) 3 MG lozenge, Take 1 lozenge (3 mg total) by mouth as needed for sore throat., Disp: 100 tablet, Rfl: 12   metoprolol  tartrate (LOPRESSOR ) 50 MG tablet, Take 1 tablet (50 mg total) by mouth 2 (two) times daily., Disp: 60 tablet, Rfl: 0   pantoprazole  (PROTONIX ) 40 MG tablet, Take 1 tablet (40 mg total) by mouth daily., Disp: 30 tablet, Rfl: 0   polyethylene glycol powder (GLYCOLAX /MIRALAX ) 17 GM/SCOOP powder, Take 17 g by mouth daily as needed for moderate constipation., Disp: 238 g, Rfl: 0   potassium chloride  SA (KLOR-CON  M) 20 MEQ tablet, Take 1 tablet (20 mEq total) by mouth 2 (two) times daily., Disp: 14 tablet, Rfl: 0   risperiDONE  (RISPERDAL ) 0.5 MG tablet, Take 1 tablet (0.5 mg total) by mouth 2 (two) times daily., Disp: 60 tablet, Rfl: 0   thiamine  (VITAMIN B1) 100 MG tablet, Take 1 tablet (100 mg total) by mouth daily., Disp: 30 tablet, Rfl: 2

## 2024-11-01 NOTE — Progress Notes (Signed)
 Pt. Is being cleaned up, this IV Nurse told to come back once she's ready for PIV start.

## 2024-11-01 NOTE — ED Notes (Signed)
 [name] from Phlebotomy tried and was not able to get labs either

## 2024-11-01 NOTE — TOC CM/SW Note (Signed)
 TOC consult received for substance abuse. Follow-up to be completed with patient as appropriate.    Merilee Batty, MSN, RN Case Management [phone]

## 2024-11-01 NOTE — ED Notes (Signed)
 CCMD called to add pt to monitoring services.

## 2024-11-01 NOTE — ED Notes (Signed)
 Pt found on ground by cleaning staff, pt appears to have slid out of the bed. Tegler MD and Medford RN called to bedside by NT.

## 2024-11-01 NOTE — ED Provider Notes (Addendum)
 " [facility] Provider Note   CSN: [id number] Arrival date & time: 11/01/24  1108     Patient presents with: No chief complaint on file.   [name] is a 69 y.o. female.   HPI     69 year old female with a history of alcohol  dependence, seizures, hypertension who presents with concern for seizures.  Partner had reported to EMS witnessing multiple seizures lasting 7 minutes and that she had been nonadherent to medication.  When I walked into the room to evaluate her, she was having an active seizure.  Unable to obtain history from patient due to active seizure and following postictal state and altered mental status.  Attempted to call partner listed and was not able to get in touch with him.    Past Medical History:  Diagnosis Date   Alcohol  dependence (HCC)    Anxiety    Chronic pain    Depression    Diverticulitis    Herniated cervical disc    Pancreatitis    Seizures (HCC)    alcoholic seizures     Prior to Admission medications  Medication Sig Start Date End Date Taking? Authorizing Provider  acetaminophen  (TYLENOL ) 500 MG tablet Take 1 tablet (500 mg total) by mouth every 6 (six) hours as needed for moderate pain (pain score 4-6) or mild pain (pain score 1-3). 09/05/24   Dennise Lavada POUR, MD  amLODipine  (NORVASC ) 10 MG tablet Take 1 tablet (10 mg total) by mouth daily. 09/05/24   Singh, Prashant K, MD  cloBAZam  (ONFI ) 10 MG tablet Take 0.5 tablets (5 mg total) by mouth at bedtime. 06/15/24   Amin, Ankit C, MD  cyanocobalamin  1000 MCG tablet Take 1 tablet (1,000 mcg total) by mouth daily. 09/28/23   Singh, Prashant K, MD  diazePAM , 15 MG Dose, 2 x 7.5 MG/0.1ML LQPK Place 15 mg into the nose as needed. 06/15/24   Amin, Ankit C, MD  DULoxetine  (CYMBALTA ) 30 MG capsule Take 1 capsule (30 mg total) by mouth daily. 01/20/24 02/19/24  Vernon Ranks, MD  erythromycin  ophthalmic ointment Place a 1/2 inch ribbon of ointment  into the lower eyelid. 04/01/24   Charlyn Sora, MD  folic acid  (FOLVITE ) 1 MG tablet Take 1 tablet (1 mg total) by mouth daily. 09/28/23   Singh, Prashant K, MD  gabapentin  (NEURONTIN ) 300 MG capsule Take 1 capsule (300 mg total) by mouth 3 (three) times daily. 10/23/23   Dean Clarity, MD  levETIRAcetam  (KEPPRA ) 1000 MG tablet Take 1 tablet (1,000 mg total) by mouth 2 (two) times daily. 09/05/24   Singh, Prashant K, MD  menthol -cetylpyridinium (CEPACOL) 3 MG lozenge Take 1 lozenge (3 mg total) by mouth as needed for sore throat. 09/28/23   Singh, Prashant K, MD  metoprolol  tartrate (LOPRESSOR ) 50 MG tablet Take 1 tablet (50 mg total) by mouth 2 (two) times daily. 09/05/24   Dennise Lavada POUR, MD  pantoprazole  (PROTONIX ) 40 MG tablet Take 1 tablet (40 mg total) by mouth daily. 01/20/24 03/20/24  Pahwani, Ravi, MD  polyethylene glycol powder (GLYCOLAX /MIRALAX ) 17 GM/SCOOP powder Take 17 g by mouth daily as needed for moderate constipation. 06/15/24   Amin, Sora BROCKS, MD  potassium chloride  SA (KLOR-CON  M) 20 MEQ tablet Take 1 tablet (20 mEq total) by mouth 2 (two) times daily. 08/22/24   Mannie Fairy DASEN, DO  risperiDONE  (RISPERDAL ) 0.5 MG tablet Take 1 tablet (0.5 mg total) by mouth 2 (two) times daily. 01/20/24 02/19/24  Vernon Ranks, MD  thiamine  (VITAMIN B1) 100 MG tablet Take 1 tablet (100 mg total) by mouth daily. 09/28/23   Singh, Prashant K, MD  metoCLOPramide  (REGLAN ) 10 MG tablet Take 1 tablet (10 mg total) by mouth every 6 (six) hours as needed for nausea (nausea/headache). 04/06/19 05/24/19  Mesner, Selinda, MD    Allergies: Keflex  [cephalexin ], Cephalosporins, Prednisone , Toradol  [ketorolac  tromethamine ], and Ultram  [tramadol ]    Review of Systems  Updated Vital Signs BP (!) 149/92 (BP Location: Right Arm)   Pulse (!) 36   Temp 97.6 F (36.4 C) (Axillary)   Resp (!) 23   SpO2 91%   Physical Exam Vitals and nursing note reviewed.  Constitutional:      Appearance: She is well-developed. She  is ill-appearing. She is not diaphoretic.  HENT:     Head: Normocephalic and atraumatic.  Eyes:     Conjunctiva/sclera: Conjunctivae normal.  Cardiovascular:     Rate and Rhythm: Normal rate and regular rhythm.     Heart sounds: Normal heart sounds. No murmur heard.    No friction rub. No gallop.  Pulmonary:     Effort: Pulmonary effort is normal. No respiratory distress.     Breath sounds: Normal breath sounds. No wheezing or rales.  Abdominal:     General: There is no distension.     Palpations: Abdomen is soft.     Tenderness: There is no abdominal tenderness. There is no guarding.  Musculoskeletal:        General: No tenderness.     Cervical back: Normal range of motion.  Skin:    General: Skin is warm and dry.     Findings: No erythema or rash.  Neurological:     GCS: GCS eye subscore is 4. GCS verbal subscore is 2. GCS motor subscore is 5.     Comments: Sleepy following seizure, difficult to understand, localizing to pain bilaterally, 4 extremities seen with movement, left arm with more movements, picking at things, but right arm does move to painful stimuli and localize, does not follow commands. Face symmetric, EOM appear normal, pupils normal     (all labs ordered are listed, but only abnormal results are displayed) Labs Reviewed  CBC WITH DIFFERENTIAL/PLATELET - Abnormal; Notable for the following components:      Result Value   MCV 104.5 (*)    MCH 34.6 (*)    RDW 16.4 (*)    Platelets 110 (*)    nRBC 0.3 (*)    Abs Immature Granulocytes 0.08 (*)    All other components within normal limits  COMPREHENSIVE METABOLIC PANEL WITH GFR - Abnormal; Notable for the following components:   Potassium 3.0 (*)    Chloride 88 (*)    CO2 10 (*)    Glucose, Bld 147 (*)    BUN <5 (*)    Calcium  8.7 (*)    AST 61 (*)    Alkaline Phosphatase 129 (*)    Anion gap 40 (*)    All other components within normal limits  URINALYSIS, W/ REFLEX TO CULTURE (INFECTION SUSPECTED) -  Abnormal; Notable for the following components:   APPearance CLOUDY (*)    Hgb urine dipstick MODERATE (*)    Ketones, ur 20 (*)    Protein, ur 100 (*)    Nitrite POSITIVE (*)    Leukocytes,Ua LARGE (*)    Bacteria, UA FEW (*)    All other components within normal limits  CBG MONITORING, ED - Abnormal; Notable for  the following components:   Glucose-Capillary 154 (*)    All other components within normal limits  I-STAT CHEM 8, ED - Abnormal; Notable for the following components:   Potassium 2.9 (*)    Chloride 96 (*)    BUN <3 (*)    Glucose, Bld 146 (*)    Calcium , Ion 0.97 (*)    TCO2 14 (*)    Hemoglobin 16.0 (*)    HCT 47.0 (*)    All other components within normal limits  I-STAT CG4 LACTIC ACID, ED - Abnormal; Notable for the following components:   Lactic Acid, Venous >15.0 (*)    All other components within normal limits  URINE CULTURE  URINE DRUG SCREEN  MAGNESIUM   LEVETIRACETAM  LEVEL  ETHANOL  I-STAT CG4 LACTIC ACID, ED    EKG: EKG Interpretation Date/Time:  Wednesday November 01 2024 11:45:55 EST Ventricular Rate:  122 PR Interval:    QRS Duration:  92 QT Interval:  334 QTC Calculation: 474 R Axis:   -87  Text Interpretation: Sinus tachycardia Ventricular premature complex Left anterior fascicular block Consider anterior infarct Nonspecific T abnormalities, inferior leads artifact Confirmed by Dreama Longs ([id number]) on 11/01/2024 12:34:35 PM  Radiology: CT Cervical Spine Wo Contrast Result Date: 11/01/2024 EXAM: CT CERVICAL SPINE WITHOUT CONTRAST 11/01/2024 05:49:25 PM TECHNIQUE: CT of the cervical spine was performed without the administration of intravenous contrast. Multiplanar reformatted images are provided for review. Automated exposure control, iterative reconstruction, and/or weight based adjustment of the mA/kV was utilized to reduce the radiation dose to as low as reasonably achievable. COMPARISON: None available. CLINICAL HISTORY: Polytrauma, blunt;  Fall out of emergency department bed. FINDINGS: BONES AND ALIGNMENT: C4 to C7 anterior cervical discectomy and fusion surgical hardware. Diffusely decreased bone density. No acute fracture or traumatic malalignment. DEGENERATIVE CHANGES: Multilevel mild degenerative change of the spine. SOFT TISSUES: No prevertebral soft tissue swelling. IMPRESSION: 1. No evidence of acute traumatic injury. 2. C4 to C7 anterior cervical discectomy and fusion surgical hardware. Electronically signed by: Morgane Naveau MD 11/01/2024 05:57 PM EST RP Workstation: [id number]   CT Head Wo Contrast Result Date: 11/01/2024 EXAM: CT HEAD WITHOUT CONTRAST 11/01/2024 05:49:25 PM TECHNIQUE: CT of the head was performed without the administration of intravenous contrast. Automated exposure control, iterative reconstruction, and/or weight based adjustment of the mA/kV was utilized to reduce the radiation dose to as low as reasonably achievable. COMPARISON: 08/29/2024 CLINICAL HISTORY: Seizure disorder, clinical change. The patient has a seizure disorder and has recently experienced a clinical change. FINDINGS: BRAIN AND VENTRICLES: No acute hemorrhage. No evidence of acute infarct. No hydrocephalus. No extra-axial collection. No mass effect or midline shift. Proportional prominence of ventricles and sulci, consistent with diffuse cerebral parenchymal volume loss. Patchy and confluent areas of decreased attenuation are noted throughout the deep and periventricular white matter of the cerebral hemispheres bilaterally suggestive of chronic microvascular ischemic changes. ORBITS: Right lens replacement. SINUSES: No acute abnormality. SOFT TISSUES AND SKULL: No acute soft tissue abnormality. No skull fracture. IMPRESSION: 1. No acute intracranial abnormality. Electronically signed by: Morgane Naveau MD 11/01/2024 05:55 PM EST RP Workstation: [id number]   DG Chest Portable 1 View Result Date: 11/01/2024 CLINICAL DATA:  Shortness of breath. EXAM:  PORTABLE CHEST 1 VIEW COMPARISON:  08/30/2024 FINDINGS: Lungs are hypoinflated and otherwise clear. Cardiomediastinal silhouette and remainder the exam is unchanged. IMPRESSION: Hypoinflation without acute cardiopulmonary disease. Electronically Signed   By: Toribio Agreste M.D.   On: 11/01/2024 12:52     .Critical Care  Performed by: Dreama Longs, MD Authorized by: Dreama Longs, MD   Critical care provider statement:    Critical care time (minutes):  30   Critical care was time spent personally by me on the following activities:  Discussions with consultants, evaluation of patient's response to treatment, examination of patient, ordering and review of laboratory studies, ordering and review of radiographic studies, ordering and performing treatments and interventions, pulse oximetry, re-evaluation of patient's condition and review of old charts    Medications Ordered in the ED  cefTRIAXone  (ROCEPHIN ) 1 g in sodium chloride  0.9 % 100 mL IVPB (has no administration in time range)  potassium chloride  10 mEq in 100 mL IVPB (has no administration in time range)  thiamine  (VITAMIN B1) 500 mg in sodium chloride  0.9 % 50 mL IVPB (has no administration in time range)    Followed by  thiamine  (VITAMIN B1) 250 mg in sodium chloride  0.9 % 50 mL IVPB (has no administration in time range)    Followed by  thiamine  (VITAMIN B1) injection 100 mg (has no administration in time range)  LORazepam  (ATIVAN ) 2 MG/ML injection (2 mg  Given 11/01/24 1125)  levETIRAcetam  (KEPPRA ) undiluted injection 4,000 mg (4,000 mg Intravenous Given 11/01/24 1230)                                    Medical Decision Making Amount and/or Complexity of Data Reviewed Labs: ordered. Radiology: ordered.  Risk Prescription drug management. Decision regarding hospitalization.    69 year old female with a history of alcohol  dependence, seizures, hypertension who presents with concern for seizures.  Report of  nonadherence to medications, 7 minute seizure per EMS history from partner. Attempted to call number in chart without success.   When I entered room she was having a seizure--lasted one minute after I arrived into the room,improving prior to being given 2mg  IM ativan .   She has purposeful movements of left arm, picking at things, moving things, and localizing able to move right arm and localize but less so than left, does have jumbled sounding speech---unclear if postictal phase, other encephalopathy, with ddx also including CVA, ICH.  No fever, hx of seizures and doubt infectious encephalitis/meningitis. Vital signs do not suggest alcohol  withdrawal, not having tremors.    Labs with significant lactic acidosis likely secondary to seizure, potassium low, AG metabolic acidosis at this time suspect likely secondary to seizure.  Has had admission in the past with seizures, encephalopathy. Would consider other etiologies (toxic etoh etc pending history from family, and expected improvement of lactate.    UA is consistent with UTI and given rocephin .  Continued altered mental status--discussed with Neurology who will begin LTM for possible subclinical status.  Will continue to discuss ?MRI, CT head still pending at this time as well as repeat lactic acid.    Given keppra  60mg /kg, 2mg  ativan , K replacement x3 ordered.      Final diagnoses:  Status epilepticus [facility])    ED Discharge Orders     None          Dreama Longs, MD 11/01/24 [id number]    Dreama Longs, MD 11/01/24 2226  "

## 2024-11-02 ENCOUNTER — Inpatient Hospital Stay (HOSPITAL_COMMUNITY)

## 2024-11-02 DIAGNOSIS — N390 Urinary tract infection, site not specified: Secondary | ICD-10-CM | POA: Diagnosis not present

## 2024-11-02 DIAGNOSIS — Z91148 Patient's other noncompliance with medication regimen for other reason: Secondary | ICD-10-CM

## 2024-11-02 DIAGNOSIS — R569 Unspecified convulsions: Secondary | ICD-10-CM | POA: Diagnosis not present

## 2024-11-02 DIAGNOSIS — F1020 Alcohol dependence, uncomplicated: Secondary | ICD-10-CM | POA: Diagnosis not present

## 2024-11-02 DIAGNOSIS — G40909 Epilepsy, unspecified, not intractable, without status epilepticus: Secondary | ICD-10-CM | POA: Diagnosis not present

## 2024-11-02 DIAGNOSIS — G40901 Epilepsy, unspecified, not intractable, with status epilepticus: Secondary | ICD-10-CM | POA: Diagnosis not present

## 2024-11-02 DIAGNOSIS — E876 Hypokalemia: Secondary | ICD-10-CM | POA: Diagnosis not present

## 2024-11-02 LAB — COMPREHENSIVE METABOLIC PANEL WITH GFR
ALT: 16 U/L (ref 0–44)
AST: 35 U/L (ref 15–41)
Albumin: 3.8 g/dL (ref 3.5–5.0)
Alkaline Phosphatase: 83 U/L (ref 38–126)
Anion gap: 14 (ref 5–15)
BUN: 5 mg/dL — ABNORMAL LOW (ref 8–23)
CO2: 26 mmol/L (ref 22–32)
Calcium: 8.5 mg/dL — ABNORMAL LOW (ref 8.9–10.3)
Chloride: 97 mmol/L — ABNORMAL LOW (ref 98–111)
Creatinine, Ser: 0.64 mg/dL (ref 0.44–1.00)
GFR, Estimated: >60 mL/min
Glucose, Bld: 83 mg/dL (ref 70–99)
Potassium: 3 mmol/L — ABNORMAL LOW (ref 3.5–5.1)
Sodium: 137 mmol/L (ref 135–145)
Total Bilirubin: 1.4 mg/dL — ABNORMAL HIGH (ref 0.0–1.2)
Total Protein: 6.2 g/dL — ABNORMAL LOW (ref 6.5–8.1)

## 2024-11-02 LAB — CBC
HCT: 32.6 % — ABNORMAL LOW (ref 36.0–46.0)
Hemoglobin: 11.7 g/dL — ABNORMAL LOW (ref 12.0–15.0)
MCH: 34.9 pg — ABNORMAL HIGH (ref 26.0–34.0)
MCHC: 35.9 g/dL (ref 30.0–36.0)
MCV: 97.3 fL (ref 80.0–100.0)
Platelets: 86 K/uL — ABNORMAL LOW (ref 150–400)
RBC: 3.35 MIL/uL — ABNORMAL LOW (ref 3.87–5.11)
RDW: 15.9 % — ABNORMAL HIGH (ref 11.5–15.5)
WBC: 4.9 K/uL (ref 4.0–10.5)
nRBC: 0 % (ref 0.0–0.2)

## 2024-11-02 LAB — C DIFFICILE QUICK SCREEN W PCR REFLEX
C Diff antigen: NEGATIVE
C Diff interpretation: NOT DETECTED
C Diff toxin: NEGATIVE

## 2024-11-02 LAB — POTASSIUM: Potassium: 2.5 mmol/L — CL (ref 3.5–5.1)

## 2024-11-02 LAB — TSH: TSH: 2.65 u[IU]/mL (ref 0.350–4.500)

## 2024-11-02 LAB — GLUCOSE, CAPILLARY: Glucose-Capillary: 91 mg/dL (ref 70–99)

## 2024-11-02 LAB — MRSA NEXT GEN BY PCR, NASAL: MRSA by PCR Next Gen: NOT DETECTED

## 2024-11-02 LAB — ETHANOL: Alcohol, Ethyl (B): <15 mg/dL

## 2024-11-02 LAB — MAGNESIUM: Magnesium: 1.3 mg/dL — ABNORMAL LOW (ref 1.7–2.4)

## 2024-11-02 MED ORDER — AMOXICILLIN-POT CLAVULANATE 875-125 MG PO TABS
1.0000 | ORAL_TABLET | Freq: Two times a day (BID) | ORAL | Status: AC
  Administered 2024-11-02 – 2024-11-05 (×8): 1 via ORAL
  Filled 2024-11-02 (×8): qty 1

## 2024-11-02 MED ORDER — POTASSIUM CHLORIDE 10 MEQ/100ML IV SOLN
10.0000 meq | INTRAVENOUS | Status: AC
  Administered 2024-11-02 – 2024-11-03 (×5): 10 meq via INTRAVENOUS
  Filled 2024-11-02 (×5): qty 100

## 2024-11-02 MED ORDER — ENSURE PLUS HIGH PROTEIN PO LIQD
237.0000 mL | Freq: Two times a day (BID) | ORAL | Status: DC
  Administered 2024-11-02 – 2024-11-06 (×8): 237 mL via ORAL

## 2024-11-02 MED ORDER — MAGNESIUM SULFATE 4 GM/100ML IV SOLN
4.0000 g | Freq: Once | INTRAVENOUS | Status: AC
  Administered 2024-11-02: 4 g via INTRAVENOUS
  Filled 2024-11-02: qty 100

## 2024-11-02 MED ORDER — ZINC OXIDE 40 % EX OINT
1.0000 | TOPICAL_OINTMENT | Freq: Every day | CUTANEOUS | Status: DC | PRN
  Administered 2024-11-02: 1 via TOPICAL
  Filled 2024-11-02: qty 57

## 2024-11-02 MED ORDER — LEVETIRACETAM (KEPPRA) 500 MG/5 ML ADULT IV PUSH
1000.0000 mg | Freq: Two times a day (BID) | INTRAVENOUS | Status: DC
  Administered 2024-11-02 – 2024-11-03 (×2): 1000 mg via INTRAVENOUS
  Filled 2024-11-02 (×2): qty 10

## 2024-11-02 MED ORDER — CLOBAZAM 5 MG PO HALF TABLET
5.0000 mg | ORAL_TABLET | Freq: Every day | ORAL | Status: DC
  Administered 2024-11-02 – 2024-11-05 (×4): 5 mg via ORAL
  Filled 2024-11-02 (×5): qty 1

## 2024-11-02 MED ORDER — POTASSIUM CHLORIDE 10 MEQ/100ML IV SOLN
10.0000 meq | INTRAVENOUS | Status: AC
  Administered 2024-11-02 (×4): 10 meq via INTRAVENOUS
  Filled 2024-11-02 (×6): qty 100

## 2024-11-02 NOTE — Progress Notes (Signed)
 LTM EEG disconnected - no skin breakdown at [facility]. [facility] notified.

## 2024-11-02 NOTE — Progress Notes (Signed)
 [facility]  PROGRESS NOTE  [name] FMW:[id number] DOB: 28-May-1956 DOA: 11/01/2024 PCP: Lenon Nell SAILOR, FNP   Brief HPI:   69 y.o. female with medical history significant seizure disorder and seizure medication noncompliance as well as alcohol  dependence and alcohol  related seizures. She was last admitted 2 months ago with seizures and prolonged postictal phase.  history comes from the patient's medical record.  Apparently her significant other called EMS because of a prolonged seizure lasting at least 7 minutes at home.  The patient did have a seizure witnessed by EMS and another 1 here in the emergency department. The patient has not had any further seizures since being loaded with Keppra  and given Ativan .  Neurology is following.  At approximately 5:20 PM the patient was found on the floor having stool and urine all around her.  After this unwitnessed fall she did have a CT of her C-spine as well as head which did not reveal any intracranial bleed or significant trauma. The patient  remained encephalopathic but no additional changes. She is currently on continuous seizure monitoring and will be admitted to the hospitalist service. Workup in the emergency department included blood work that showed a normal sodium.  Potassium 2.9.  Creatinine 0.6.  Her lactic acid was elevated at greater than 15.  Her alk phos was elevated at 129 but T. bili was 1.1 AST 61.  ALT 25.  Magnesium  level 1.7.  Her white blood cell count was normal at 5.8 hemoglobin initially 13.9.  Urinalysis did reveal positive nitrate and large leukocytes.     Assessment/Plan:   Seizure -Breakthrough seizure likely in setting of UTI and noncompliance with medications -Neurology saw the patient and started on continuous EEG monitoring -CT head showed no acute abnormality -Patient was given Keppra  in the ED - Patient is supposed to on Keppra  1 g IV every 12 hours; will start Keppra  -Neurology  following  History of alcohol  dependence -COVID 12 seizures - Started on CIWA protocol, - Continue Ativan  as needed per CIWA protocol - Continue thiamine , folate  Severe hypokalemia - Potassium is 3.0 - Will replace potassium and follow BMP in am  Hypomagnesemia - Magnesium  1.3 - Replace magnesium  and follow mag level in a.m.  UTI - Urine culture grew E. Coli - Changed from Levaquin  to Augmentin  as Levaquin  can lower seizure threshold - Follow culture results  Bedbugs reported by EMS where she was picked up - Social work consult      DVT prophylaxis: SCDs  Medications     amoxicillin -clavulanate  1 tablet Oral Q12H   folic acid   1 mg Oral Daily   multivitamin with minerals  1 tablet Oral Daily   sodium chloride  flush  3 mL Intravenous Q12H   [START ON 11/09/2024] thiamine  (VITAMIN B1) injection  100 mg Intravenous Daily     Data Reviewed:   CBG:  Recent Labs  Lab 11/01/24 1135 11/02/24 0746  GLUCAP 154* 91    SpO2: 96 %    Vitals:   11/02/24 0339 11/02/24 0530 11/02/24 0732 11/02/24 1126  BP: 138/73 122/83 (!) 140/78 116/73  Pulse: 65 78 (!) 56 87  Resp: 20 20 15 17   Temp: 98.1 F (36.7 C) 99 F (37.2 C) 98.3 F (36.8 C) 99.1 F (37.3 C)  TempSrc: Axillary Axillary Oral Oral  SpO2: 100% 100% 93% 96%      Data Reviewed:  Basic Metabolic Panel: Recent Labs  Lab 11/01/24 1138 11/01/24 1209 11/02/24 0824  NA  138 135 137  K 3.0* 2.9* 3.0*  CL 88* 96* 97*  CO2 10*  --  26  GLUCOSE 147* 146* 83  BUN <5* <3* 5*  CREATININE 0.67 0.60 0.64  CALCIUM  8.7*  --  8.5*  MG 1.7  --  1.3*    CBC: Recent Labs  Lab 11/01/24 1138 11/01/24 1209 11/02/24 0824  WBC 5.8  --  4.9  NEUTROABS 4.6  --   --   HGB 13.9 16.0* 11.7*  HCT 42.0 47.0* 32.6*  MCV 104.5*  --  97.3  PLT 110*  --  86*    LFT Recent Labs  Lab 11/01/24 1138 11/02/24 0824  AST 61* 35  ALT 25 16  ALKPHOS 129* 83  BILITOT 1.1 1.4*  PROT 8.1 6.2*  ALBUMIN 4.7 3.8      Antibiotics: Anti-infectives (From admission, onward)    Start     Dose/Rate Route Frequency Ordered Stop   11/02/24 1145  amoxicillin -clavulanate (AUGMENTIN ) 875-125 MG per tablet 1 tablet        1 tablet Oral Every 12 hours 11/02/24 1057     11/01/24 1848  levofloxacin  (LEVAQUIN ) IVPB 750 mg  Status:  Discontinued        750 mg 100 mL/hr over 90 Minutes Intravenous Every 24 hours 11/01/24 1848 11/02/24 1057   11/01/24 1515  cefTRIAXone  (ROCEPHIN ) 1 g in sodium chloride  0.9 % 100 mL IVPB  Status:  Discontinued        1 g 200 mL/hr over 30 Minutes Intravenous  Once 11/01/24 1503 11/01/24 1848        CONSULTS neurology  Code Status: Full code  Family Communication: No family at bedside     Subjective   Patient is alert, communicating well.  Denies any complaints.   Objective    Physical Examination:   General-appears in no acute distress Heart-S1-S2, regular, no murmur auscultated Lungs-clear to auscultation bilaterally, no wheezing or crackles auscultated Abdomen-soft, nontender, no organomegaly Extremities-no edema in the lower extremities Neuro-alert, oriented x3, no focal deficit noted           Byrd Rushlow S Seyon Strader   [facility] If 7PM-7AM, please contact night-coverage at [email or web address], Office  [phone]   11/02/2024, 2:21 PM  LOS: 1 day

## 2024-11-02 NOTE — TOC CM/SW Note (Addendum)
 Transition of Care [facility]) - Inpatient Brief Assessment   Patient Details  Name: August Gosser MRN: [id number] Date of Birth: 06-20-1956  [facility] CM/SW Contact:    Andrez JULIANNA George, RN Phone Number: 11/02/2024, 2:32 PM   Clinical Narrative:  Pt is know to CM from previous admissions. Pt lives in [place] with her SO. She is a frequent admit due to alcohol  abuse.  IP Care management following for disposition.  SDOH Interventions Today    Flowsheet Row Most Recent Value  SDOH Interventions   Food Insecurity Interventions [place] [facility] Referral, Inpatient TOC  Housing Interventions [facility] Referral, Inpatient TOC  Utilities Interventions [facility] [facility] Referral, Inpatient TOC  Financial Strain Interventions [facility] [facility] Referral, Inpatient [facility]  Physical Activity Interventions Inpatient [facility]    Transportation information added to AVS.  Awaiting pt to have therapy evals to see what physical activity interventions she will need.   Transition of Care Asessment:   Patient has primary care physician: Yes Home environment has been reviewed: motel with SO   Prior/Current Home Services: No current home services Social Drivers of Health Review: SDOH reviewed needs interventions Readmission risk has been reviewed: Yes Transition of care needs: transition of care needs identified, TOC will continue to follow

## 2024-11-02 NOTE — Progress Notes (Signed)
 NEUROLOGY CONSULT FOLLOW UP NOTE   Date of service: November 02, 2024 Patient Name: [name] MRN:  [id number] DOB:  [birth year]  Interval Hx/subjective   Awake, alert, back to her baseline. She missed several doses. Does understand that she needs to take her medication. No issues or side effects from meds. When asked if there is anything we can do to help with compliance, she says no.  Vitals   Vitals:   11/02/24 0339 11/02/24 0530 11/02/24 0732 11/02/24 1126  BP: 138/73 122/83 (!) 140/78 116/73  Pulse: 65 78 (!) 56 87  Resp: 20 20 15 17   Temp: 98.1 F (36.7 C) 99 F (37.2 C) 98.3 F (36.8 C) 99.1 F (37.3 C)  TempSrc: Axillary Axillary Oral Oral  SpO2: 100% 100% 93% 96%     There is no height or weight on file to calculate BMI.  Physical Exam   General: Laying comfortably in bed; in no acute distress.  HENT: Normal oropharynx and mucosa. Normal external appearance of ears and nose.  Neck: Supple, no pain or tenderness  CV: No JVD. No peripheral edema. Pulmonary: Symmetric Chest rise. Normal respiratory effort.  Abdomen: Soft to touch, non-tender.  Ext: No cyanosis, edema, or deformity  Skin: No rash. Normal palpation of skin.   Musculoskeletal: Normal digits and nails by inspection. No clubbing.   Neurologic Examination  Mental status/Cognition: Alert, oriented to self, place, month but not to year, fair attention.  Speech/language: Fluent, comprehension intact, object naming intact, repetition intact.  Cranial nerves:   CN II Pupils equal and reactive to light, no VF deficits    CN III,IV,VI EOM intact, no gaze preference or deviation, no nystagmus    CN V normal sensation in V1, V2, and V3 segments bilaterally    CN VII no asymmetry, no nasolabial fold flattening    CN VIII normal hearing to speech    CN IX & X normal palatal elevation, no uvular deviation    CN XI 5/5 head turn and 5/5 shoulder shrug bilaterally    CN XII midline tongue protrusion     Motor:  Muscle bulk: poo, tone normal., pronator drift none. tremor none Mvmt Root Nerve  Muscle Right Left Comments  SA C5/6 Ax Deltoid     EF C5/6 Mc Biceps 5 5   EE C6/7/8 Rad Triceps 5 5   WF C6/7 Med FCR     WE C7/8 PIN ECU     F Ab C8/T1 U ADM/FDI 5 5   HF L1/2/3 Fem Illopsoas 5 5   KE L2/3/4 Fem Quad     DF L4/5 D Peron Tib Ant     PF S1/2 Tibial Grc/Sol      Sensation:  Light touch Intact throughout   Pin prick    Temperature    Vibration   Proprioception    Coordination/Complex Motor:  - Finger to Nose intact BL Medications Current Medications[1]  Labs and Diagnostic Imaging   CBC:  Recent Labs  Lab 11/01/24 1138 11/01/24 1209 11/02/24 0824  WBC 5.8  --  4.9  NEUTROABS 4.6  --   --   HGB 13.9 16.0* 11.7*  HCT 42.0 47.0* 32.6*  MCV 104.5*  --  97.3  PLT 110*  --  86*    Basic Metabolic Panel:  Lab Results  Component Value Date   NA 137 11/02/2024   K 3.0 (L) 11/02/2024   CO2 26 11/02/2024   GLUCOSE 83 11/02/2024   BUN 5 (  L) 11/02/2024   CREATININE 0.64 11/02/2024   CALCIUM  8.5 (L) 11/02/2024   GFRNONAA >60 11/02/2024   GFRAA >60 06/09/2020   Lipid Panel:  Lab Results  Component Value Date   LDLCALC 58 06/09/2020   HgbA1c:  Lab Results  Component Value Date   HGBA1C 4.5 (L) 06/11/2024   Urine Drug Screen:     Component Value Date/Time   LABOPIA NEGATIVE 11/01/2024 1147   COCAINSCRNUR NEGATIVE 11/01/2024 1147   LABBENZ NEGATIVE 11/01/2024 1147   AMPHETMU NEGATIVE 11/01/2024 1147   THCU NEGATIVE 11/01/2024 1147   LABBARB NEGATIVE 11/01/2024 1147    Alcohol  Level     Component Value Date/Time   ETH <15 11/02/2024 0824   INR  Lab Results  Component Value Date   INR 1.0 05/17/2022   APTT  Lab Results  Component Value Date   APTT 22 (L) 05/17/2022   AED levels:  Lab Results  Component Value Date   PHENYTOIN  <2.5 (L) 10/17/2022   LEVETIRACETA 168.0 (H) 06/11/2024    CT Head without contrast(Personally reviewed): CTH  was negative for a large hypodensity concerning for a large territory infarct or hyperdensity concerning for an ICH  cEEG:  This study is suggestive of cortical dysfunction arising from left posterior temporal region likely secondary to underlying structural abnormality. Additionally there is generalize cerebral dysfunction (encephalopathy). The excessive beta activity seen in the background is most likely due to the effect of benzodiazepine and is a benign EEG pattern. No seizures or epileptiform discharges were seen throughout the recording.   Assessment   [name] is a 69 y.o. female [name] is a 69 y.o. female   hx of noncompliance with medications,  ETOH abuse, chronic pain, seizures on Keppra  and Onfi  and gabapentin , anxiety and depression, pancreatitis who presents from home via EMS for having multiple seizures in the setting of noncompliance with home medications.  She was initially noted to have aphasia and therefore concern for subclinical seizures. LTM EEG with no seizures and no longer aphasic on exam. Likely this was secondary to post ictal state.  Counseled her on the importance of taking home AEDs.  Recommendations  - continue Keppra  1000mg  BID, Onfi  5mg  at bedtime - discontinue LTM EEG. - neurology will signoff. - treatment of UTI per primary team. ______________________________________________________________________  Plan discussed with Dr. Drusilla with the Hospitalist team.  Signed, Caine Barfield, MD [facility]     [1]  Current Facility-Administered Medications:    acetaminophen  (TYLENOL ) tablet 500 mg, 500 mg, Oral, Q12H PRN **OR** acetaminophen  (TYLENOL ) suppository 650 mg, 650 mg, Rectal, Q6H PRN, Claiborne, Claudia, MD   amoxicillin -clavulanate (AUGMENTIN ) 875-125 MG per tablet 1 tablet, 1 tablet, Oral, Q12H, Drusilla, Sabas RAMAN, MD, 1 tablet at 11/02/24 1150   bisacodyl  (DULCOLAX) EC tablet 5 mg, 5 mg, Oral, Daily PRN,  Claiborne, Claudia, MD   folic acid  (FOLVITE ) tablet 1 mg, 1 mg, Oral, Daily, Claiborne, Claudia, MD, 1 mg at 11/02/24 1009   liver oil-zinc  oxide (DESITIN) 40 % ointment 1 Application, 1 Application, Topical, Daily PRN, Drusilla Sabas RAMAN, MD, 1 Application at 11/02/24 1216   LORazepam  (ATIVAN ) tablet 1-4 mg, 1-4 mg, Oral, Q1H PRN **OR** LORazepam  (ATIVAN ) injection 1-4 mg, 1-4 mg, Intravenous, Q1H PRN, Claiborne, Claudia, MD   magnesium  sulfate IVPB 4 g 100 mL, 4 g, Intravenous, Once, Drusilla Sabas RAMAN, MD, Last Rate: 50 mL/hr at 11/02/24 1157, 4 g at 11/02/24 1157   multivitamin with minerals tablet 1 tablet, 1  tablet, Oral, Daily, Claiborne, Claudia, MD, 1 tablet at 11/02/24 1009   ondansetron  (ZOFRAN ) tablet 4 mg, 4 mg, Oral, Q6H PRN **OR** ondansetron  (ZOFRAN ) injection 4 mg, 4 mg, Intravenous, Q6H PRN, Claiborne, Claudia, MD   sodium chloride  flush (NS) 0.9 % injection 3 mL, 3 mL, Intravenous, Q12H, Claiborne, Claudia, MD, 3 mL at 11/02/24 1009   thiamine  (VITAMIN B1) 500 mg in sodium chloride  0.9 % 50 mL IVPB, 500 mg, Intravenous, Q8H, Last Rate: 110 mL/hr at 11/02/24 0944, 500 mg at 11/02/24 0944 **FOLLOWED BY** [START ON 11/03/2024] thiamine  (VITAMIN B1) 250 mg in sodium chloride  0.9 % 50 mL IVPB, 250 mg, Intravenous, Daily **FOLLOWED BY** [START ON 11/09/2024] thiamine  (VITAMIN B1) injection 100 mg, 100 mg, Intravenous, Daily, Trevonne Nyland, MD

## 2024-11-02 NOTE — Procedures (Addendum)
 Patient Name: [name]  MRN: [id number]  Epilepsy Attending: Arlin MALVA Krebs  Referring Physician/Provider: Khaliqdina, Salman, MD  Duration: 11/01/2024 1458 to to 11/02/2024 1312   Patient history: 69 year old female with history of seizures and repeated breakthrough seizures in the setting of medication noncompliance. EEG to evaluate for seizure   Level of alertness: Awake, asleep   AEDs during EEG study:  LEV, Ativan    Technical aspects: This EEG study was done with scalp electrodes positioned according to the 10-20 International system of electrode placement. Electrical activity was reviewed with band pass filter of 1-70Hz , sensitivity of 7 uV/mm, display speed of 55mm/sec with a 60Hz  notched filter applied as appropriate. EEG data were recorded continuously and digitally stored.  Video monitoring was available and reviewed as appropriate.   Description: No clear posterior dominant rhythm was seen. Sleep was characterized by vertex waves, sleep spindles (12 to 14 Hz), maximal frontocentral region. EEG showed continuous generalized and maximal left posterior quadrant 3 to 6 Hz theta-delta slowing. There is an excessive amount of 15 to 18 Hz beta activity distributed symmetrically and diffusely. Hyperventilation and photic stimulation were not performed.     EEG was disconnected between 11/01/2024 2053 to 11/03/2024 9479 for unclear reasons   ABNORMALITY - Continuous slow, generalized generalized and maximal left posterior quadrant - Excessive beta, generalized   IMPRESSION: This study is suggestive of cortical dysfunction arising from left posterior temporal region likely secondary to underlying structural abnormality. Additionally there is generalize cerebral dysfunction (encephalopathy). The excessive beta activity seen in the background is most likely due to the effect of benzodiazepine and is a benign EEG pattern. No seizures or epileptiform discharges were seen throughout the  recording.   Saavi Mceachron O Margarine Grosshans

## 2024-11-02 NOTE — Progress Notes (Signed)
 Initial Nutrition Assessment  DOCUMENTATION CODES:   Not applicable  INTERVENTION:   Feeding and ordering assistance while restrained.   Continue MVI with minerals, Folate and Thiamin while recovering from alcohol  use.   Ensure Plus High Protein po BID, each supplement provides 350 kcal and 20 grams of protein.    NUTRITION DIAGNOSIS:   Inadequate oral intake related to lethargy/confusion as evidenced by per patient/family report.   GOAL:   Patient will meet greater than or equal to 90% of their needs   MONITOR:   PO intake, Supplement acceptance  REASON FOR ASSESSMENT:   Malnutrition Screening Tool    ASSESSMENT:   Pt presented with seizures lasting 7 minutes, PMH of seizures, medicine noncompliance, alcohol  dependence (vitamins), AMS.  Pt reported nutrition hx but presented with some confusion. Pt reported low appetite and only eating apple sauce, crackers, and coke today. PTA she would eat chicken pot pie, mashed potatoes, baked chicken, with beer or coke. She would have meals 2 times a day. Pt used to weigh 157.5 lbs but currently weighs 143.4 lbs. Physical exam shows mild muscle loss but fat stores are intact. Pt uses a cane to get around.  Pt started on CIWA this morning.  08/2024- admitted with seizures 1/14- presented to ED with seizures  --> 2 falls 3hr apart   Labs reviewed:  K: 3.0  Cl: 97  BUN: 5  Ca: 8.5  Mg: 1.3   Scheduled Meds:  folic acid   1 mg Oral Daily   multivitamin with minerals  1 tablet Oral Daily   [START ON 11/09/2024] thiamine  (VITAMIN B1) injection  100 mg Intravenous Daily   Continuous Infusions:  levofloxacin  (LEVAQUIN ) IV Stopped (11/02/24 0214)   thiamine  (VITAMIN B1) injection 500 mg (11/02/24 0944)   Followed by   NOREEN ON 11/03/2024] thiamine  (VITAMIN B1) injection     PRN Meds: ondansetron  (ZOFRAN ) IV, LORazepam     NUTRITION - FOCUSED PHYSICAL EXAM:  Flowsheet Row Most Recent Value  Orbital Region No depletion   Upper Arm Region No depletion  Thoracic and Lumbar Region No depletion  Buccal Region No depletion  Temple Region No depletion  Clavicle Bone Region No depletion  Clavicle and Acromion Bone Region Mild depletion  Scapular Bone Region No depletion  Dorsal Hand Unable to assess  [mittens]  Patellar Region Moderate depletion  Anterior Thigh Region Moderate depletion  Posterior Calf Region Mild depletion  Edema (RD Assessment) None  Hair Reviewed  Eyes Reviewed  Mouth Reviewed  Skin Reviewed  Nails Reviewed    Diet Order:   Diet Order             Diet regular Room service appropriate? Yes; Fluid consistency: Thin  Diet effective now                   EDUCATION NEEDS:   Education needs have been addressed  Skin:  Skin Assessment: Reviewed RN Assessment  Last BM:  1/15  Height:   Ht Readings from Last 1 Encounters:  08/30/24 5' 1 (1.549 m)    Weight:   Wt Readings from Last 1 Encounters:  11/02/24 65.2 kg    BMI:  Body mass index is 27.16 kg/m.  Estimated Nutritional Needs:   Kcal:  1,400-1,600  Protein:  75-90  Fluid:  >1.5 L/day    Con Friends, Dietetic Intern

## 2024-11-02 NOTE — Plan of Care (Signed)
 She was in soft wrist restraints throughout the day.     Problem: Safety: Goal: Non-violent Restraint(s) Outcome: Progressing   Problem: Education: Goal: Knowledge of General Education information will improve Description: Including pain rating scale, medication(s)/side effects and non-pharmacologic comfort measures Outcome: Progressing   Problem: Clinical Measurements: Goal: Will remain free from infection Outcome: Progressing   Problem: Activity: Goal: Risk for activity intolerance will decrease Outcome: Progressing   Problem: Nutrition: Goal: Adequate nutrition will be maintained Outcome: Progressing   Problem: Coping: Goal: Level of anxiety will decrease Outcome: Progressing   Problem: Skin Integrity: Goal: Risk for impaired skin integrity will decrease Outcome: Progressing   Problem: Safety: Goal: Ability to remain free from injury will improve Outcome: Progressing

## 2024-11-03 DIAGNOSIS — E876 Hypokalemia: Secondary | ICD-10-CM | POA: Diagnosis not present

## 2024-11-03 DIAGNOSIS — N390 Urinary tract infection, site not specified: Secondary | ICD-10-CM | POA: Diagnosis not present

## 2024-11-03 DIAGNOSIS — F1020 Alcohol dependence, uncomplicated: Secondary | ICD-10-CM | POA: Diagnosis not present

## 2024-11-03 DIAGNOSIS — G40901 Epilepsy, unspecified, not intractable, with status epilepticus: Secondary | ICD-10-CM | POA: Diagnosis not present

## 2024-11-03 LAB — POTASSIUM: Potassium: 2.8 mmol/L — ABNORMAL LOW (ref 3.5–5.1)

## 2024-11-03 LAB — COMPREHENSIVE METABOLIC PANEL WITH GFR
ALT: 16 U/L (ref 0–44)
AST: 32 U/L (ref 15–41)
Albumin: 3.7 g/dL (ref 3.5–5.0)
Alkaline Phosphatase: 80 U/L (ref 38–126)
Anion gap: 11 (ref 5–15)
BUN: 6 mg/dL — ABNORMAL LOW (ref 8–23)
CO2: 28 mmol/L (ref 22–32)
Calcium: 8.9 mg/dL (ref 8.9–10.3)
Chloride: 99 mmol/L (ref 98–111)
Creatinine, Ser: 0.64 mg/dL (ref 0.44–1.00)
GFR, Estimated: >60 mL/min
Glucose, Bld: 96 mg/dL (ref 70–99)
Potassium: 3 mmol/L — ABNORMAL LOW (ref 3.5–5.1)
Sodium: 137 mmol/L (ref 135–145)
Total Bilirubin: 0.9 mg/dL (ref 0.0–1.2)
Total Protein: 6.1 g/dL — ABNORMAL LOW (ref 6.5–8.1)

## 2024-11-03 LAB — URINE CULTURE: Culture: >=100000 — AB

## 2024-11-03 LAB — CBC
HCT: 31.6 % — ABNORMAL LOW (ref 36.0–46.0)
Hemoglobin: 11.3 g/dL — ABNORMAL LOW (ref 12.0–15.0)
MCH: 35.1 pg — ABNORMAL HIGH (ref 26.0–34.0)
MCHC: 35.8 g/dL (ref 30.0–36.0)
MCV: 98.1 fL (ref 80.0–100.0)
Platelets: 102 K/uL — ABNORMAL LOW (ref 150–400)
RBC: 3.22 MIL/uL — ABNORMAL LOW (ref 3.87–5.11)
RDW: 16 % — ABNORMAL HIGH (ref 11.5–15.5)
WBC: 4 K/uL (ref 4.0–10.5)
nRBC: 0 % (ref 0.0–0.2)

## 2024-11-03 LAB — MAGNESIUM: Magnesium: 1.8 mg/dL (ref 1.7–2.4)

## 2024-11-03 MED ORDER — NICOTINE 21 MG/24HR TD PT24
21.0000 mg | MEDICATED_PATCH | Freq: Every day | TRANSDERMAL | Status: DC
  Administered 2024-11-03 – 2024-11-06 (×4): 21 mg via TRANSDERMAL
  Filled 2024-11-03 (×4): qty 1

## 2024-11-03 MED ORDER — POTASSIUM CHLORIDE 10 MEQ/100ML IV SOLN
10.0000 meq | INTRAVENOUS | Status: AC
  Administered 2024-11-03 (×3): 10 meq via INTRAVENOUS
  Filled 2024-11-03 (×2): qty 100

## 2024-11-03 MED ORDER — LEVETIRACETAM 500 MG PO TABS
1000.0000 mg | ORAL_TABLET | Freq: Two times a day (BID) | ORAL | Status: DC
  Administered 2024-11-03 – 2024-11-06 (×6): 1000 mg via ORAL
  Filled 2024-11-03 (×6): qty 2

## 2024-11-03 NOTE — Progress Notes (Signed)
 [facility]  PROGRESS NOTE  [name] FMW:[id number] DOB: 01-26-1956 DOA: 11/01/2024 PCP: Lenon Nell SAILOR, FNP   Brief HPI:   69 y.o. female with medical history significant seizure disorder and seizure medication noncompliance as well as alcohol  dependence and alcohol  related seizures. She was last admitted 2 months ago with seizures and prolonged postictal phase.  history comes from the patient's medical record.  Apparently her significant other called EMS because of a prolonged seizure lasting at least 7 minutes at home.  The patient did have a seizure witnessed by EMS and another 1 here in the emergency department. The patient has not had any further seizures since being loaded with Keppra  and given Ativan .  Neurology is following.  At approximately 5:20 PM the patient was found on the floor having stool and urine all around her.  After this unwitnessed fall she did have a CT of her C-spine as well as head which did not reveal any intracranial bleed or significant trauma. The patient  remained encephalopathic but no additional changes. She is currently on continuous seizure monitoring and will be admitted to the hospitalist service. Workup in the emergency department included blood work that showed a normal sodium.  Potassium 2.9.  Creatinine 0.6.  Her lactic acid was elevated at greater than 15.  Her alk phos was elevated at 129 but T. bili was 1.1 AST 61.  ALT 25.  Magnesium  level 1.7.  Her white blood cell count was normal at 5.8 hemoglobin initially 13.9.  Urinalysis did reveal positive nitrate and large leukocytes.     Assessment/Plan:   Seizure -Breakthrough seizure likely in setting of UTI and noncompliance with medications -Neurology saw the patient and started on continuous EEG monitoring -CT head showed no acute abnormality -Patient was given Keppra  in the ED - Patient is supposed to on Keppra  1 g IV every 12 hours; will start Keppra  -Neurology has signed  off, recommend to continue with Onfi  and Keppra   Acute metabolic encephalopathy/history of alcohol  dependence - Likely in setting of alcohol  withdrawal - Continue Ativan  per CIWA protocol - Continue thiamine /folate   Severe hypokalemia - Potassium is 3.0 - Will replace potassium and follow BMP in am  Hypomagnesemia - Replete  UTI - Urine culture grew E. Coli - Changed from Levaquin  to Augmentin  as Levaquin  can lower seizure threshold - Follow culture results  Bedbugs reported by EMS where she was picked up - Social work consult      DVT prophylaxis: SCDs  Medications     amoxicillin -clavulanate  1 tablet Oral Q12H   cloBAZam   5 mg Oral QHS   feeding supplement  237 mL Oral BID WC   folic acid   1 mg Oral Daily   levETIRAcetam   1,000 mg Oral BID   multivitamin with minerals  1 tablet Oral Daily   nicotine   21 mg Transdermal Daily   sodium chloride  flush  3 mL Intravenous Q12H   [START ON 11/09/2024] thiamine  (VITAMIN B1) injection  100 mg Intravenous Daily     Data Reviewed:   CBG:  Recent Labs  Lab 11/01/24 1135 11/02/24 0746  GLUCAP 154* 91    SpO2: 97 %    Vitals:   11/03/24 0349 11/03/24 0736 11/03/24 1215 11/03/24 1300  BP: 127/86 136/85 (!) 131/91 124/87  Pulse: 82 98 (!) 102 (!) 105  Resp:  20 16   Temp: 98.3 F (36.8 C) 98.8 F (37.1 C)    TempSrc: Oral Oral    SpO2:  97% 98% 97%   Weight:          Data Reviewed:  Basic Metabolic Panel: Recent Labs  Lab 11/01/24 1138 11/01/24 1209 11/02/24 0824 11/02/24 1703 11/03/24 0249  NA 138 135 137  --  137  K 3.0* 2.9* 3.0* 2.5* 2.8*  3.0*  CL 88* 96* 97*  --  99  CO2 10*  --  26  --  28  GLUCOSE 147* 146* 83  --  96  BUN <5* <3* 5*  --  6*  CREATININE 0.67 0.60 0.64  --  0.64  CALCIUM  8.7*  --  8.5*  --  8.9  MG 1.7  --  1.3*  --  1.8    CBC: Recent Labs  Lab 11/01/24 1138 11/01/24 1209 11/02/24 0824 11/03/24 0639  WBC 5.8  --  4.9 4.0  NEUTROABS 4.6  --   --   --   HGB  13.9 16.0* 11.7* 11.3*  HCT 42.0 47.0* 32.6* 31.6*  MCV 104.5*  --  97.3 98.1  PLT 110*  --  86* 102*    LFT Recent Labs  Lab 11/01/24 1138 11/02/24 0824 11/03/24 0249  AST 61* 35 32  ALT 25 16 16   ALKPHOS 129* 83 80  BILITOT 1.1 1.4* 0.9  PROT 8.1 6.2* 6.1*  ALBUMIN 4.7 3.8 3.7     Antibiotics: Anti-infectives (From admission, onward)    Start     Dose/Rate Route Frequency Ordered Stop   11/02/24 1145  amoxicillin -clavulanate (AUGMENTIN ) 875-125 MG per tablet 1 tablet        1 tablet Oral Every 12 hours 11/02/24 1057     11/01/24 1848  levofloxacin  (LEVAQUIN ) IVPB 750 mg  Status:  Discontinued        750 mg 100 mL/hr over 90 Minutes Intravenous Every 24 hours 11/01/24 1848 11/02/24 1057   11/01/24 1515  cefTRIAXone  (ROCEPHIN ) 1 g in sodium chloride  0.9 % 100 mL IVPB  Status:  Discontinued        1 g 200 mL/hr over 30 Minutes Intravenous  Once 11/01/24 1503 11/01/24 1848        CONSULTS neurology  Code Status: Full code  Family Communication: No family at bedside     Subjective   Patient seen and examined, continues to be confused.   Objective    Physical Examination:  Appears in no acute distress, confused S1-S2, regular, no murmur auscultated Lungs clear to auscultation bilaterally Abdomen is soft, nontender           Jmichael Gille S Noeh Sparacino   [facility] If 7PM-7AM, please contact night-coverage at [email or web address], Office  [phone]   11/03/2024, 1:40 PM  LOS: 2 days

## 2024-11-03 NOTE — Plan of Care (Signed)
 Was on soft wrist restraints throughout the day.    Problem: Safety: Goal: Non-violent Restraint(s) Outcome: Progressing   Problem: Education: Goal: Knowledge of General Education information will improve Description: Including pain rating scale, medication(s)/side effects and non-pharmacologic comfort measures Outcome: Progressing   Problem: Clinical Measurements: Goal: Will remain free from infection Outcome: Progressing   Problem: Activity: Goal: Risk for activity intolerance will decrease Outcome: Progressing   Problem: Nutrition: Goal: Adequate nutrition will be maintained Outcome: Progressing   Problem: Coping: Goal: Level of anxiety will decrease Outcome: Progressing   Problem: Safety: Goal: Ability to remain free from injury will improve Outcome: Progressing   Problem: Skin Integrity: Goal: Risk for impaired skin integrity will decrease Outcome: Progressing

## 2024-11-03 NOTE — Progress Notes (Signed)
PHARMACIST - PHYSICIAN COMMUNICATION  DR:   Sharl Ma  CONCERNING: IV to Oral Route Change Policy  RECOMMENDATION: This patient is receiving Keppra by the intravenous route.  Based on criteria approved by the Pharmacy and Therapeutics Committee, the intravenous medication(s) is/are being converted to the equivalent oral dose form(s).   DESCRIPTION: These criteria include: The patient is eating (either orally or via tube) and/or has been taking other orally administered medications for a least 24 hours The patient has no evidence of active gastrointestinal bleeding or impaired GI absorption (gastrectomy, short bowel, patient on TNA or NPO).  If you have questions about this conversion, please contact the Pharmacy Department  []   [phone] )  [facility] []   [phone] )  [facility] [x]   [phone] )  [facility] []   [phone] )  [facility] []   [phone] )  [facility]

## 2024-11-03 NOTE — Plan of Care (Signed)
  Problem: Clinical Measurements: Goal: Ability to maintain clinical measurements within normal limits will improve Outcome: Progressing Goal: Will remain free from infection Outcome: Progressing Goal: Diagnostic test results will improve Outcome: Progressing Goal: Respiratory complications will improve Outcome: Progressing Goal: Cardiovascular complication will be avoided Outcome: Progressing   Problem: Activity: Goal: Risk for activity intolerance will decrease Outcome: Progressing   Problem: Elimination: Goal: Will not experience complications related to bowel motility Outcome: Progressing Goal: Will not experience complications related to urinary retention Outcome: Progressing   Problem: Pain Managment: Goal: General experience of comfort will improve and/or be controlled Outcome: Progressing   Problem: Safety: Goal: Ability to remain free from injury will improve Outcome: Progressing   Problem: Skin Integrity: Goal: Risk for impaired skin integrity will decrease Outcome: Progressing

## 2024-11-04 DIAGNOSIS — E876 Hypokalemia: Secondary | ICD-10-CM | POA: Diagnosis not present

## 2024-11-04 DIAGNOSIS — G40901 Epilepsy, unspecified, not intractable, with status epilepticus: Secondary | ICD-10-CM | POA: Diagnosis not present

## 2024-11-04 DIAGNOSIS — N390 Urinary tract infection, site not specified: Secondary | ICD-10-CM | POA: Diagnosis not present

## 2024-11-04 DIAGNOSIS — F1020 Alcohol dependence, uncomplicated: Secondary | ICD-10-CM | POA: Diagnosis not present

## 2024-11-04 LAB — CBC
HCT: 40.3 % (ref 36.0–46.0)
Hemoglobin: 14.1 g/dL (ref 12.0–15.0)
MCH: 34.9 pg — ABNORMAL HIGH (ref 26.0–34.0)
MCHC: 35 g/dL (ref 30.0–36.0)
MCV: 99.8 fL (ref 80.0–100.0)
Platelets: 138 K/uL — ABNORMAL LOW (ref 150–400)
RBC: 4.04 MIL/uL (ref 3.87–5.11)
RDW: 15.5 % (ref 11.5–15.5)
WBC: 5.6 K/uL (ref 4.0–10.5)
nRBC: 0 % (ref 0.0–0.2)

## 2024-11-04 LAB — COMPREHENSIVE METABOLIC PANEL WITH GFR
ALT: 21 U/L (ref 0–44)
AST: 58 U/L — ABNORMAL HIGH (ref 15–41)
Albumin: 4.2 g/dL (ref 3.5–5.0)
Alkaline Phosphatase: 91 U/L (ref 38–126)
Anion gap: 16 — ABNORMAL HIGH (ref 5–15)
BUN: <5 mg/dL — ABNORMAL LOW (ref 8–23)
CO2: 24 mmol/L (ref 22–32)
Calcium: 9.6 mg/dL (ref 8.9–10.3)
Chloride: 96 mmol/L — ABNORMAL LOW (ref 98–111)
Creatinine, Ser: 0.58 mg/dL (ref 0.44–1.00)
GFR, Estimated: >60 mL/min
Glucose, Bld: 101 mg/dL — ABNORMAL HIGH (ref 70–99)
Potassium: 3.7 mmol/L (ref 3.5–5.1)
Sodium: 136 mmol/L (ref 135–145)
Total Bilirubin: 0.9 mg/dL (ref 0.0–1.2)
Total Protein: 7.5 g/dL (ref 6.5–8.1)

## 2024-11-04 LAB — LEVETIRACETAM LEVEL: Levetiracetam Lvl: <2 ug/mL — ABNORMAL LOW (ref 10.0–40.0)

## 2024-11-04 NOTE — Progress Notes (Signed)
 [facility]  PROGRESS NOTE  [name] FMW:[id number] DOB: [birth year] DOA: 11/01/2024 PCP: Lenon Nell SAILOR, FNP   Brief HPI:   69 y.o. female with medical history significant seizure disorder and seizure medication noncompliance as well as alcohol  dependence and alcohol  related seizures. She was last admitted 2 months ago with seizures and prolonged postictal phase.  history comes from the patient's medical record.  Apparently her significant other called EMS because of a prolonged seizure lasting at least 7 minutes at home.  The patient did have a seizure witnessed by EMS and another 1 here in the emergency department. The patient has not had any further seizures since being loaded with Keppra  and given Ativan .  Neurology is following.  At approximately 5:20 PM the patient was found on the floor having stool and urine all around her.  After this unwitnessed fall she did have a CT of her C-spine as well as head which did not reveal any intracranial bleed or significant trauma. The patient  remained encephalopathic but no additional changes. She is currently on continuous seizure monitoring and will be admitted to the hospitalist service. Workup in the emergency department included blood work that showed a normal sodium.  Potassium 2.9.  Creatinine 0.6.  Her lactic acid was elevated at greater than 15.  Her alk phos was elevated at 129 but T. bili was 1.1 AST 61.  ALT 25.  Magnesium  level 1.7.  Her white blood cell count was normal at 5.8 hemoglobin initially 13.9.  Urinalysis did reveal positive nitrate and large leukocytes.     Assessment/Plan:   Seizure -Breakthrough seizure likely in setting of UTI and noncompliance with medications -Neurology saw the patient and started on continuous EEG monitoring -CT head showed no acute abnormality -Patient was given Keppra  in the ED - Patient is supposed to on Keppra  1 g IV every 12 hours; will start Keppra  -Neurology has signed  off, recommend to continue with Onfi  and Keppra   Acute metabolic encephalopathy/history of alcohol  dependence - Likely in setting of alcohol  withdrawal - Continue Ativan  per CIWA protocol - Continue thiamine /folate   Severe hypokalemia - Replete Hypomagnesemia - Replete  UTI - Urine culture grew E. Coli - Changed from Levaquin  to Augmentin  as Levaquin  can lower seizure threshold - E. coli is sensitive to ampicillin /sulbactam - Will continue with Augmentin   Bedbugs reported by EMS where she was picked up - Social work consult      DVT prophylaxis: SCDs  Medications     amoxicillin -clavulanate  1 tablet Oral Q12H   cloBAZam   5 mg Oral QHS   feeding supplement  237 mL Oral BID WC   folic acid   1 mg Oral Daily   levETIRAcetam   1,000 mg Oral BID   multivitamin with minerals  1 tablet Oral Daily   nicotine   21 mg Transdermal Daily   sodium chloride  flush  3 mL Intravenous Q12H   [START ON 11/09/2024] thiamine  (VITAMIN B1) injection  100 mg Intravenous Daily     Data Reviewed:   CBG:  Recent Labs  Lab 11/01/24 1135 11/02/24 0746  GLUCAP 154* 91    SpO2: 96 %    Vitals:   11/04/24 0048 11/04/24 0423 11/04/24 0822 11/04/24 1129  BP: (!) 140/102 (!) 132/90 (!) 151/103 136/89  Pulse: (!) 107 87 (!) 106 96  Resp:  17 18 17   Temp:  97.8 F (36.6 C) 98.9 F (37.2 C) 98.8 F (37.1 C)  TempSrc:  Oral Oral Oral  SpO2: 100% 98% 98% 96%  Weight:          Data Reviewed:  Basic Metabolic Panel: Recent Labs  Lab 11/01/24 1138 11/01/24 1209 11/02/24 0824 11/02/24 1703 11/03/24 0249 11/04/24 0854  NA 138 135 137  --  137 136  K 3.0* 2.9* 3.0* 2.5* 2.8*  3.0* 3.7  CL 88* 96* 97*  --  99 96*  CO2 10*  --  26  --  28 24  GLUCOSE 147* 146* 83  --  96 101*  BUN <5* <3* 5*  --  6* <5*  CREATININE 0.67 0.60 0.64  --  0.64 0.58  CALCIUM  8.7*  --  8.5*  --  8.9 9.6  MG 1.7  --  1.3*  --  1.8  --     CBC: Recent Labs  Lab 11/01/24 1138 11/01/24 1209  11/02/24 0824 11/03/24 0639 11/04/24 0854  WBC 5.8  --  4.9 4.0 5.6  NEUTROABS 4.6  --   --   --   --   HGB 13.9 16.0* 11.7* 11.3* 14.1  HCT 42.0 47.0* 32.6* 31.6* 40.3  MCV 104.5*  --  97.3 98.1 99.8  PLT 110*  --  86* 102* 138*    LFT Recent Labs  Lab 11/01/24 1138 11/02/24 0824 11/03/24 0249 11/04/24 0854  AST 61* 35 32 58*  ALT 25 16 16 21   ALKPHOS 129* 83 80 91  BILITOT 1.1 1.4* 0.9 0.9  PROT 8.1 6.2* 6.1* 7.5  ALBUMIN 4.7 3.8 3.7 4.2     Antibiotics: Anti-infectives (From admission, onward)    Start     Dose/Rate Route Frequency Ordered Stop   11/02/24 1145  amoxicillin -clavulanate (AUGMENTIN ) 875-125 MG per tablet 1 tablet        1 tablet Oral Every 12 hours 11/02/24 1057     11/01/24 1848  levofloxacin  (LEVAQUIN ) IVPB 750 mg  Status:  Discontinued        750 mg 100 mL/hr over 90 Minutes Intravenous Every 24 hours 11/01/24 1848 11/02/24 1057   11/01/24 1515  cefTRIAXone  (ROCEPHIN ) 1 g in sodium chloride  0.9 % 100 mL IVPB  Status:  Discontinued        1 g 200 mL/hr over 30 Minutes Intravenous  Once 11/01/24 1503 11/01/24 1848        CONSULTS neurology  Code Status: Full code  Family Communication: No family at bedside     Subjective    Patient seen and examined, continues to be confused.  Objective    Physical Examination:  Alert, confused, following commands S1-S2, regular, no murmur auscultated Lungs clear to auscultation bilaterally Abdomen is soft, nontender, no organomegaly          [name] S [name]   [facility] If 7PM-7AM, please contact night-coverage at [email or web address], Office  [phone]   11/04/2024, 1:17 PM  LOS: 3 days

## 2024-11-04 NOTE — Plan of Care (Signed)

## 2024-11-04 NOTE — Progress Notes (Signed)
 Physical soft restraint on B/L arm  removed at 0820. Circulation and neurovascular assessment done and pt is allow to take meal by herself.

## 2024-11-04 NOTE — Plan of Care (Signed)
 After removal of restrain at morning pt does not got agitated, was sleeping during the day time. Is alert and follows command but confused and is hallucinating intermittently. Frequent reorientation to environment done as well as address her physical needs.  Problem: Safety: Goal: Non-violent Restraint(s) Outcome: Progressing   Problem: Clinical Measurements: Goal: Ability to maintain clinical measurements within normal limits will improve Outcome: Progressing   Problem: Clinical Measurements: Goal: Will remain free from infection Outcome: Progressing   Problem: Clinical Measurements: Goal: Diagnostic test results will improve Outcome: Progressing   Problem: Nutrition: Goal: Adequate nutrition will be maintained Outcome: Progressing   Problem: Activity: Goal: Risk for activity intolerance will decrease Outcome: Progressing   Problem: Elimination: Goal: Will not experience complications related to urinary retention Outcome: Progressing   Problem: Elimination: Goal: Will not experience complications related to bowel motility Outcome: Progressing   Problem: Pain Managment: Goal: General experience of comfort will improve and/or be controlled Outcome: Progressing   Problem: Skin Integrity: Goal: Risk for impaired skin integrity will decrease Outcome: Progressing   Problem: Safety: Goal: Ability to remain free from injury will improve Outcome: Progressing

## 2024-11-05 DIAGNOSIS — E876 Hypokalemia: Secondary | ICD-10-CM | POA: Diagnosis not present

## 2024-11-05 DIAGNOSIS — N390 Urinary tract infection, site not specified: Secondary | ICD-10-CM | POA: Diagnosis not present

## 2024-11-05 DIAGNOSIS — G40901 Epilepsy, unspecified, not intractable, with status epilepticus: Secondary | ICD-10-CM | POA: Diagnosis not present

## 2024-11-05 DIAGNOSIS — F1020 Alcohol dependence, uncomplicated: Secondary | ICD-10-CM | POA: Diagnosis not present

## 2024-11-05 NOTE — Plan of Care (Signed)
 Noticed that she is not having urination from morning. Frequently offered for fluids. MD notified. Bladder scan done as per order. Made walk to the bathroom, used front wheel walker to go the restroom.. After that had urination. Again bladder scan done and was 0ml. Md notified  Problem: Safety: Goal: Verbalization of understanding the information provided will improve Outcome: Progressing   Problem: Clinical Measurements: Goal: Complications related to the disease process, condition or treatment will be avoided or minimized Outcome: Progressing   Problem: Self-Concept: Goal: Ability to verbalize feelings about condition will improve Outcome: Progressing   Problem: Self-Concept: Goal: Level of anxiety will decrease Outcome: Progressing   Problem: Health Behavior/Discharge Planning: Goal: Compliance with prescribed medication regimen will improve Outcome: Progressing   Problem: Education: Goal: Expressions of having a comfortable level of knowledge regarding the disease process will increase Outcome: Progressing   Problem: Skin Integrity: Goal: Risk for impaired skin integrity will decrease Outcome: Progressing   Problem: Safety: Goal: Ability to remain free from injury will improve Outcome: Progressing   Problem: Elimination: Goal: Will not experience complications related to urinary retention Outcome: Progressing   Problem: Pain Managment: Goal: General experience of comfort will improve and/or be controlled Outcome: Progressing   Problem: Nutrition: Goal: Adequate nutrition will be maintained Outcome: Progressing   Problem: Clinical Measurements: Goal: Will remain free from infection Outcome: Progressing   Problem: Health Behavior/Discharge Planning: Goal: Ability to manage health-related needs will improve Outcome: Progressing   Problem: Education: Goal: Knowledge of General Education information will improve Description: Including pain rating scale,  medication(s)/side effects and non-pharmacologic comfort measures Outcome: Progressing

## 2024-11-05 NOTE — Progress Notes (Signed)
 [facility]  PROGRESS NOTE  [name] FMW:[id number] DOB: 10/12/1956 DOA: 11/01/2024 PCP: Lenon Nell SAILOR, FNP   Brief HPI:   69 y.o. female with medical history significant seizure disorder and seizure medication noncompliance as well as alcohol  dependence and alcohol  related seizures. She was last admitted 2 months ago with seizures and prolonged postictal phase.  history comes from the patient'Emily medical record.  Apparently her significant other called EMS because of a prolonged seizure lasting at least 7 minutes at home.  The patient did have a seizure witnessed by EMS and another 1 here in the emergency department. The patient has not had any further seizures since being loaded with Keppra  and given Ativan .  Neurology is following.  At approximately 5:20 PM the patient was found on the floor having stool and urine all around her.  After this unwitnessed fall she did have a CT of her C-spine as well as head which did not reveal any intracranial bleed or significant trauma. The patient  remained encephalopathic but no additional changes. She is currently on continuous seizure monitoring and will be admitted to the hospitalist service. Workup in the emergency department included blood work that showed a normal sodium.  Potassium 2.9.  Creatinine 0.6.  Her lactic acid was elevated at greater than 15.  Her alk phos was elevated at 129 but T. bili was 1.1 AST 61.  ALT 25.  Magnesium  level 1.7.  Her white blood cell count was normal at 5.8 hemoglobin initially 13.9.  Urinalysis did reveal positive nitrate and large leukocytes.     Assessment/Plan:   Seizure -Breakthrough seizure likely in setting of UTI and noncompliance with medications -Neurology saw the patient and started on continuous EEG monitoring -CT head showed no acute abnormality -Patient was given Keppra  in the ED - Patient is supposed to on Keppra  1 g IV every 12 hours; will start Keppra  -Neurology has signed  off, recommend to continue with Onfi  and Keppra   Acute metabolic encephalopathy/history of alcohol  dependence -Resolved, back to baseline - Likely in setting of alcohol  withdrawal - Continue Ativan  per CIWA protocol - Continue thiamine /folate   Severe hypokalemia - Replete  Hypomagnesemia - Replete  UTI - Urine culture grew E. Coli - Changed from Levaquin  to Augmentin  as Levaquin  can lower seizure threshold - E. coli is sensitive to ampicillin /sulbactam - Will continue with Augmentin   Bedbugs reported by EMS where she was picked up - Social work consult      DVT prophylaxis: SCDs  Medications     amoxicillin -clavulanate  1 tablet Oral Q12H   cloBAZam   5 mg Oral QHS   feeding supplement  237 mL Oral BID WC   folic acid   1 mg Oral Daily   levETIRAcetam   1,000 mg Oral BID   multivitamin with minerals  1 tablet Oral Daily   nicotine   21 mg Transdermal Daily   sodium chloride  flush  3 mL Intravenous Q12H   [START ON 11/09/2024] thiamine  (VITAMIN B1) injection  100 mg Intravenous Daily     Data Reviewed:   CBG:  Recent Labs  Lab 11/01/24 1135 11/02/24 0746  GLUCAP 154* 91    SpO2: 98 %    Vitals:   11/04/24 2017 11/05/24 0035 11/05/24 0416 11/05/24 0800  BP: 135/89 129/81 (!) 124/90   Pulse: (!) 107 98 89 87  Resp:  20 18   Temp: 98.6 F (37 C) 98 F (36.7 C) (!) 97.5 F (36.4 C) 98.4 F (36.9 C)  TempSrc: Oral Oral Oral Oral  SpO2: 93% 97% 98% 98%  Weight:          Data Reviewed:  Basic Metabolic Panel: Recent Labs  Lab 11/01/24 1138 11/01/24 1209 11/02/24 0824 11/02/24 1703 11/03/24 0249 11/04/24 0854  NA 138 135 137  --  137 136  K 3.0* 2.9* 3.0* 2.5* 2.8*  3.0* 3.7  CL 88* 96* 97*  --  99 96*  CO2 10*  --  26  --  28 24  GLUCOSE 147* 146* 83  --  96 101*  BUN <5* <3* 5*  --  6* <5*  CREATININE 0.67 0.60 0.64  --  0.64 0.58  CALCIUM  8.7*  --  8.5*  --  8.9 9.6  MG 1.7  --  1.3*  --  1.8  --     CBC: Recent Labs  Lab  11/01/24 1138 11/01/24 1209 11/02/24 0824 11/03/24 0639 11/04/24 0854  WBC 5.8  --  4.9 4.0 5.6  NEUTROABS 4.6  --   --   --   --   HGB 13.9 16.0* 11.7* 11.3* 14.1  HCT 42.0 47.0* 32.6* 31.6* 40.3  MCV 104.5*  --  97.3 98.1 99.8  PLT 110*  --  86* 102* 138*    LFT Recent Labs  Lab 11/01/24 1138 11/02/24 0824 11/03/24 0249 11/04/24 0854  AST 61* 35 32 58*  ALT 25 16 16 21   ALKPHOS 129* 83 80 91  BILITOT 1.1 1.4* 0.9 0.9  PROT 8.1 6.2* 6.1* 7.5  ALBUMIN 4.7 3.8 3.7 4.2     Antibiotics: Anti-infectives (From admission, onward)    Start     Dose/Rate Route Frequency Ordered Stop   11/02/24 1145  amoxicillin -clavulanate (AUGMENTIN ) 875-125 MG per tablet 1 tablet        1 tablet Oral Every 12 hours 11/02/24 1057     11/01/24 1848  levofloxacin  (LEVAQUIN ) IVPB 750 mg  Status:  Discontinued        750 mg 100 mL/hr over 90 Minutes Intravenous Every 24 hours 11/01/24 1848 11/02/24 1057   11/01/24 1515  cefTRIAXone  (ROCEPHIN ) 1 g in sodium chloride  0.9 % 100 mL IVPB  Status:  Discontinued        1 g 200 mL/hr over 30 Minutes Intravenous  Once 11/01/24 1503 11/01/24 1848        CONSULTS neurology  Code Status: Full code  Family Communication: No family at bedside     Subjective   Patient seen and examined, denies any complaints.  Knows that she is in the hospital, knows why she is here.  Was able to tell me correctly month.   Objective    Physical Examination:  General-appears in no acute distress Heart-S1-S2, regular, no murmur auscultated Lungs-clear to auscultation bilaterally, no wheezing or crackles auscultated Abdomen-soft, nontender, no organomegaly Extremities-no edema in the lower extremities Neuro-alert, oriented x3, no focal deficit noted         [name] [name]   [facility] If 7PM-7AM, please contact night-coverage at [email or web address], Office  [phone]   11/05/2024, 8:38 AM  LOS: 4 days

## 2024-11-05 NOTE — Plan of Care (Signed)

## 2024-11-06 ENCOUNTER — Telehealth (HOSPITAL_COMMUNITY): Payer: Self-pay

## 2024-11-06 ENCOUNTER — Other Ambulatory Visit (HOSPITAL_COMMUNITY): Payer: Self-pay

## 2024-11-06 MED ORDER — CLOBAZAM 10 MG PO TABS
5.0000 mg | ORAL_TABLET | Freq: Every day | ORAL | 3 refills | Status: AC
  Filled 2024-11-06: qty 15, 30d supply, fill #0

## 2024-11-06 MED ORDER — PANTOPRAZOLE SODIUM 40 MG PO TBEC
40.0000 mg | DELAYED_RELEASE_TABLET | Freq: Every day | ORAL | 1 refills | Status: AC
  Filled 2024-11-06: qty 30, 30d supply, fill #0

## 2024-11-06 MED ORDER — OXYCODONE HCL 5 MG PO TABS
5.0000 mg | ORAL_TABLET | Freq: Four times a day (QID) | ORAL | Status: DC | PRN
  Administered 2024-11-06: 5 mg via ORAL
  Filled 2024-11-06: qty 1

## 2024-11-06 MED ORDER — LEVETIRACETAM 1000 MG PO TABS
1000.0000 mg | ORAL_TABLET | Freq: Two times a day (BID) | ORAL | 3 refills | Status: AC
  Filled 2024-11-06: qty 60, 30d supply, fill #0

## 2024-11-06 NOTE — Evaluation (Signed)
 Physical Therapy Evaluation Patient Details Name: [name] MRN: [id number] DOB: [birth year] Today's Date: 11/06/2024  History of Present Illness  69 y.o. female presents to [facility] hospital on 11/01/2024 after a seizure. UA concerning for UTI. PMH includes seizure disorder with medication noncompliance and alcohol  dependence, MDD, GERD.  Clinical Impression  Pt presents to PT without significant deficits in mobility compared to reported baseline. Pt reports being able to ambulate within her motel room with use of a SPC independently. On eval the pt is able to ambulate for household distances with use of RW or SPC, demonstrating good stability. Pt is eager to discharge home. Pt appears near her baseline, and has no current PT or DME needs. Acute PT signing off.      If plan is discharge home, recommend the following: Assistance with cooking/housework;Assist for transportation   Can travel by private vehicle        Equipment Recommendations None recommended by PT  Recommendations for Other Services       Functional Status Assessment Patient has not had a recent decline in their functional status     Precautions / Restrictions Precautions Precautions: Fall Recall of Precautions/Restrictions: Intact Restrictions Weight Bearing Restrictions Per Provider Order: No      Mobility  Bed Mobility Overal bed mobility: Modified Independent                  Transfers Overall transfer level: Modified independent Equipment used: Rolling walker (2 wheels)                    Ambulation/Gait Ambulation/Gait assistance: Modified independent (Device/Increase time) Gait Distance (Feet): 200 Feet (additional trial of 100') Assistive device: Rolling walker (2 wheels), Straight cane Gait Pattern/deviations: Step-through pattern Gait velocity: reduced Gait velocity interpretation: 1.31 - 2.62 ft/sec, indicative of limited community ambulator   General Gait Details:  steady step-through gait, no significant balance deviations noted  Stairs            Wheelchair Mobility     Tilt Bed    Modified Rankin (Stroke Patients Only)       Balance Overall balance assessment: Needs assistance Sitting-balance support: No upper extremity supported, Feet supported Sitting balance-Leahy Scale: Good     Standing balance support: Single extremity supported, Reliant on assistive device for balance Standing balance-Leahy Scale: Poor                               Pertinent Vitals/Pain Pain Assessment Pain Assessment: 0-10 Pain Score: 9  Pain Location: everywhere Pain Descriptors / Indicators: Aching Pain Intervention(s): Monitored during session    Home Living Family/patient expects to be discharged to:: Other (Comment)                   Additional Comments: Motel, level entry, lives with spouse    Prior Function Prior Level of Function : Needs assist             Mobility Comments: ambulates with use of SPC in the motel room primarily ADLs Comments: assistance for IADLs     Extremity/Trunk Assessment   Upper Extremity Assessment Upper Extremity Assessment: Overall WFL for tasks assessed    Lower Extremity Assessment Lower Extremity Assessment: Overall WFL for tasks assessed    Cervical / Trunk Assessment Cervical / Trunk Assessment: Normal  Communication   Communication Communication: No apparent difficulties    Cognition Arousal: Alert Behavior During Therapy:  WFL for tasks assessed/performed   PT - Cognitive impairments: No family/caregiver present to determine baseline                       PT - Cognition Comments: appropriate for this session, follows commands well Following commands: Intact       Cueing Cueing Techniques: Verbal cues     General Comments General comments (skin integrity, edema, etc.): pt in NAD    Exercises     Assessment/Plan    PT Assessment Patient does  not need any further PT services  PT Problem List         PT Treatment Interventions      PT Goals (Current goals can be found in the Care Plan section)       Frequency       Co-evaluation               AM-PAC PT 6 Clicks Mobility  Outcome Measure Help needed turning from your back to your side while in a flat bed without using bedrails?: None Help needed moving from lying on your back to sitting on the side of a flat bed without using bedrails?: None Help needed moving to and from a bed to a chair (including a wheelchair)?: None Help needed standing up from a chair using your arms (e.g., wheelchair or bedside chair)?: None Help needed to walk in hospital room?: None Help needed climbing 3-5 steps with a railing? : None 6 Click Score: 24    End of Session Equipment Utilized During Treatment: Gait belt Activity Tolerance: Patient tolerated treatment well Patient left: in bed;with call bell/phone within reach;with bed alarm set Nurse Communication: Mobility status PT Visit Diagnosis: Other abnormalities of gait and mobility (R26.89)    Time: 8955-8887 PT Time Calculation (min) (ACUTE ONLY): 28 min   Charges:   PT Evaluation $PT Eval Low Complexity: 1 Low   PT General Charges $$ ACUTE PT VISIT: 1 Visit         Bernardino JINNY Ruth, PT, DPT Acute Rehabilitation Office [phone]   Bernardino JINNY Ruth 11/06/2024, 11:17 AM

## 2024-11-06 NOTE — Plan of Care (Signed)

## 2024-11-06 NOTE — Progress Notes (Signed)
 Discharged to home accompanied by transporter. Taxi voucher handed to patient.

## 2024-11-06 NOTE — Progress Notes (Incomplete)
 [facility]  PROGRESS NOTE  [name] FMW:[id number] DOB: 03-13-1956 DOA: 11/01/2024 PCP: Emily Nell SAILOR, FNP   Brief HPI:   69 y.o. female with medical history significant seizure disorder and seizure medication noncompliance as well as alcohol  dependence and alcohol  related seizures. She was last admitted 2 months ago with seizures and prolonged postictal phase.  history comes from the patient's medical record.  Apparently her significant other called EMS because of a prolonged seizure lasting at least 7 minutes at home.  The patient did have a seizure witnessed by EMS and another 1 here in the emergency department. The patient has not had any further seizures since being loaded with Keppra  and given Ativan .  Neurology is following.  At approximately 5:20 PM the patient was found on the floor having stool and urine all around her.  After this unwitnessed fall she did have a CT of her C-spine as well as head which did not reveal any intracranial bleed or significant trauma. The patient  remained encephalopathic but no additional changes. She is currently on continuous seizure monitoring and will be admitted to the hospitalist service. Workup in the emergency department included blood work that showed a normal sodium.  Potassium 2.9.  Creatinine 0.6.  Her lactic acid was elevated at greater than 15.  Her alk phos was elevated at 129 but T. bili was 1.1 AST 61.  ALT 25.  Magnesium  level 1.7.  Her white blood cell count was normal at 5.8 hemoglobin initially 13.9.  Urinalysis did reveal positive nitrate and large leukocytes.     Assessment/Plan:   Seizure -Breakthrough seizure likely in setting of UTI and noncompliance with medications -Neurology saw the patient and started on continuous EEG monitoring -CT head showed no acute abnormality -Patient was given Keppra  in the ED - Patient is supposed to on Keppra  1 g IV every 12 hours; will start Keppra  -Neurology has signed  off, recommend to continue with Onfi  and Keppra   Acute metabolic encephalopathy/history of alcohol  dependence -Resolved, back to baseline - Likely in setting of alcohol  withdrawal - Continue Ativan  per CIWA protocol - Continue thiamine /folate   Severe hypokalemia - Replete  Hypomagnesemia - Replete  UTI - Urine culture grew E. Coli - Changed from Levaquin  to Augmentin  as Levaquin  can lower seizure threshold - E. coli is sensitive to ampicillin /sulbactam - Will continue with Augmentin   Bedbugs reported by EMS where she was picked up - Social work consult      DVT prophylaxis: SCDs  Medications     cloBAZam   5 mg Oral QHS   feeding supplement  237 mL Oral BID WC   folic acid   1 mg Oral Daily   levETIRAcetam   1,000 mg Oral BID   multivitamin with minerals  1 tablet Oral Daily   nicotine   21 mg Transdermal Daily   sodium chloride  flush  3 mL Intravenous Q12H   [START ON 11/09/2024] thiamine  (VITAMIN B1) injection  100 mg Intravenous Daily     Data Reviewed:   CBG:  Recent Labs  Lab 11/01/24 1135 11/02/24 0746  GLUCAP 154* 91    SpO2: 96 %    Vitals:   11/05/24 2027 11/06/24 0035 11/06/24 0455 11/06/24 0757  BP: 111/88 120/76 123/71 122/88  Pulse: 93 82 82 78  Resp: 20 17 17 18   Temp: 98.5 F (36.9 C) 98.3 F (36.8 C) (!) 97.5 F (36.4 C) 97.8 F (36.6 C)  TempSrc: Oral Oral Oral Oral  SpO2: 97% 97% 96%  96%  Weight:          Data Reviewed:  Basic Metabolic Panel: Recent Labs  Lab 11/01/24 1138 11/01/24 1209 11/02/24 0824 11/02/24 1703 11/03/24 0249 11/04/24 0854  NA 138 135 137  --  137 136  K 3.0* 2.9* 3.0* 2.5* 2.8*  3.0* 3.7  CL 88* 96* 97*  --  99 96*  CO2 10*  --  26  --  28 24  GLUCOSE 147* 146* 83  --  96 101*  BUN <5* <3* 5*  --  6* <5*  CREATININE 0.67 0.60 0.64  --  0.64 0.58  CALCIUM  8.7*  --  8.5*  --  8.9 9.6  MG 1.7  --  1.3*  --  1.8  --     CBC: Recent Labs  Lab 11/01/24 1138 11/01/24 1209 11/02/24 0824  11/03/24 0639 11/04/24 0854  WBC 5.8  --  4.9 4.0 5.6  NEUTROABS 4.6  --   --   --   --   HGB 13.9 16.0* 11.7* 11.3* 14.1  HCT 42.0 47.0* 32.6* 31.6* 40.3  MCV 104.5*  --  97.3 98.1 99.8  PLT 110*  --  86* 102* 138*    LFT Recent Labs  Lab 11/01/24 1138 11/02/24 0824 11/03/24 0249 11/04/24 0854  AST 61* 35 32 58*  ALT 25 16 16 21   ALKPHOS 129* 83 80 91  BILITOT 1.1 1.4* 0.9 0.9  PROT 8.1 6.2* 6.1* 7.5  ALBUMIN 4.7 3.8 3.7 4.2     Antibiotics: Anti-infectives (From admission, onward)    Start     Dose/Rate Route Frequency Ordered Stop   11/02/24 1145  amoxicillin -clavulanate (AUGMENTIN ) 875-125 MG per tablet 1 tablet        1 tablet Oral Every 12 hours 11/02/24 1057 11/05/24 2247   11/01/24 1848  levofloxacin  (LEVAQUIN ) IVPB 750 mg  Status:  Discontinued        750 mg 100 mL/hr over 90 Minutes Intravenous Every 24 hours 11/01/24 1848 11/02/24 1057   11/01/24 1515  cefTRIAXone  (ROCEPHIN ) 1 g in sodium chloride  0.9 % 100 mL IVPB  Status:  Discontinued        1 g 200 mL/hr over 30 Minutes Intravenous  Once 11/01/24 1503 11/01/24 1848        CONSULTS neurology  Code Status: Full code  Family Communication: No family at bedside     Subjective      Objective    Physical Examination:          [name] [name]   [facility] If 7PM-7AM, please contact night-coverage at [email or web address], Office  [phone]   11/06/2024, 9:33 AM  LOS: 5 days

## 2024-11-06 NOTE — Care Management Important Message (Signed)
 Important Message  Patient Details  Name: [name] MRN: [id number] Date of Birth: [birth year]   Important Message Given:  Yes - Medicare IM     Claretta Deed 11/06/2024, 3:48 PM

## 2024-11-06 NOTE — Discharge Summary (Signed)
 " Physician Discharge Summary   Patient: [name] MRN: [id number] DOB: [birth year]  Admit date:     11/01/2024  Discharge date: 11/06/24  Discharge Physician: Sabas GORMAN Brod   PCP: Lenon Nell SAILOR, FNP   Recommendations at discharge:   Follow-up PCP in 1 week  Discharge Diagnoses: Principal Problem:   Status epilepticus (HCC) Active Problems:   Alcohol  use disorder, severe, dependence (HCC)   Hypokalemia  Resolved Problems:   * No resolved hospital problems. *  Hospital Course: 69 y.o. female with medical history significant seizure disorder and seizure medication noncompliance as well as alcohol  dependence and alcohol  related seizures. She was last admitted 2 months ago with seizures and prolonged postictal phase.  history comes from the patient's medical record.  Apparently her significant other called EMS because of a prolonged seizure lasting at least 7 minutes at home.  The patient did have a seizure witnessed by EMS and another 1 here in the emergency department. The patient has not had any further seizures since being loaded with Keppra  and given Ativan .  Neurology is following.  At approximately 5:20 PM the patient was found on the floor having stool and urine all around her.  After this unwitnessed fall she did have a CT of her C-spine as well as head which did not reveal any intracranial bleed or significant trauma. The patient  remained encephalopathic but no additional changes. She is currently on continuous seizure monitoring and will be admitted to the hospitalist service. Workup in the emergency department included blood work that showed a normal sodium.  Potassium 2.9.  Creatinine 0.6.  Her lactic acid was elevated at greater than 15.  Her alk phos was elevated at 129 but T. bili was 1.1 AST 61.  ALT 25.  Magnesium  level 1.7.  Her white blood cell count was normal at 5.8 hemoglobin initially 13.9.  Urinalysis did reveal positive nitrate and large leukocytes.      Assessment and Plan:  Seizure -Breakthrough seizure likely in setting of UTI and noncompliance with medications -Neurology saw the patient and started on continuous EEG monitoring -CT head showed no acute abnormality -Patient was given Keppra  in the ED - Patient is supposed to on Keppra  1 g IV every 12 hours; will start Keppra  -Neurology has signed off, recommend to continue with Onfi  and Keppra    Acute metabolic encephalopathy/history of alcohol  dependence -Resolved, back to baseline - Likely in setting of alcohol  withdrawal - Continue thiamine /folate     Severe hypokalemia - Replete   Hypomagnesemia - Replete   UTI - Urine culture grew E. Coli - Changed from Levaquin  to Augmentin  as Levaquin  can lower seizure threshold - E. coli is sensitive to ampicillin /sulbactam - Completed treatment with Augmentin  in the hospital   Bedbugs reported by EMS where she was picked up - Patient lives with a boyfriend in [place]. -She will go back to motel.         Consultants: Neurology Procedures performed: Continuous EEG monitoring Disposition: Home Diet recommendation:  Regular diet DISCHARGE MEDICATION: Allergies as of 11/06/2024       Reactions   Keflex  [cephalexin ] Hives   Cephalosporins Hives   Prednisone  Swelling   Toradol  [ketorolac  Tromethamine ] Hives   Ultram  [tramadol ] Hives        Medication List     STOP taking these medications    amLODipine  10 MG tablet Commonly known as: NORVASC    DULoxetine  30 MG capsule Commonly known as: CYMBALTA    erythromycin  ophthalmic ointment  gabapentin  300 MG capsule Commonly known as: Neurontin    menthol  3 MG lozenge Commonly known as: CEPACOL   metoprolol  tartrate 50 MG tablet Commonly known as: LOPRESSOR    polyethylene glycol powder 17 GM/SCOOP powder Commonly known as: GLYCOLAX /MIRALAX    potassium chloride  SA 20 MEQ tablet Commonly known as: KLOR-CON  M   risperiDONE  0.5 MG tablet Commonly known as:  RISPERDAL        TAKE these medications    Acetaminophen  Extra Strength 500 MG Tabs Take 1 tablet (500 mg total) by mouth every 6 (six) hours as needed for moderate pain (pain score 4-6) or mild pain (pain score 1-3).   cloBAZam  10 MG tablet Commonly known as: ONFI  Take 0.5 tablets (5 mg total) by mouth at bedtime.   cyanocobalamin  1000 MCG tablet Take 1 tablet (1,000 mcg total) by mouth daily.   folic acid  1 MG tablet Commonly known as: FOLVITE  Take 1 tablet (1 mg total) by mouth daily.   levETIRAcetam  1000 MG tablet Commonly known as: Keppra  Take 1 tablet (1,000 mg total) by mouth 2 (two) times daily.   pantoprazole  40 MG tablet Commonly known as: Protonix  Take 1 tablet (40 mg total) by mouth daily.   thiamine  100 MG tablet Commonly known as: VITAMIN B1 Take 1 tablet (100 mg total) by mouth daily.   Valtoco  15 MG Dose 2 x 7.5 MG/0.1ML Lqpk Generic drug: diazePAM  (15 MG Dose) Place 15 mg into the nose as needed.        Follow-up Information     [employer] transportation Follow up.   Why: Call 2-3 days in advance to schedule transportation for appointments Contact information: [phone]        [facility] Follow up.   Why: Call for assistance with activities Contact information: [address], [place], [place] [address] Phone: [phone]        Lenon Nell SAILOR, FNP Follow up in 1 week(s).   Specialty: Nurse Practitioner Contact information: [address] [place] [address] [phone]                Discharge Exam: [name]   11/02/24 1459  Weight: 65.2 kg   General-appears in no acute distress Heart-S1-S2, regular, no murmur auscultated Lungs-clear to auscultation bilaterally, no wheezing or crackles auscultated Abdomen-soft, nontender, no organomegaly Extremities-no edema in the lower extremities Neuro-alert, oriented x3, no focal deficit noted  Condition at discharge: good  The  results of significant diagnostics from this hospitalization (including imaging, microbiology, ancillary and laboratory) are listed below for reference.   Imaging Studies: Overnight EEG with video Result Date: 11/02/2024 Shelton Arlin KIDD, MD     11/03/2024  4:54 PM Patient Name: Anntionette Madkins MRN: [id number] Epilepsy Attending: Arlin KIDD Shelton Referring Physician/Provider: Khaliqdina, Salman, MD Duration: 11/01/2024 1458 to to 11/02/2024 1312  Patient history: 69 year old female with history of seizures and repeated breakthrough seizures in the setting of medication noncompliance. EEG to evaluate for seizure  Level of alertness: Awake, asleep  AEDs during EEG study:  LEV, Ativan   Technical aspects: This EEG study was done with scalp electrodes positioned according to the 10-20 International system of electrode placement. Electrical activity was reviewed with band pass filter of 1-70Hz , sensitivity of 7 uV/mm, display speed of 48mm/sec with a 60Hz  notched filter applied as appropriate. EEG data were recorded continuously and digitally stored.  Video monitoring was available and reviewed as appropriate.  Description: No clear posterior dominant rhythm was seen. Sleep was  characterized by vertex waves, sleep spindles (12 to 14 Hz), maximal frontocentral region. EEG showed continuous generalized and maximal left posterior quadrant 3 to 6 Hz theta-delta slowing. There is an excessive amount of 15 to 18 Hz beta activity distributed symmetrically and diffusely. Hyperventilation and photic stimulation were not performed.   EEG was disconnected between 11/01/2024 2053 to 11/02/2024 9479 for unclear reasons  ABNORMALITY - Continuous slow, generalized generalized and maximal left posterior quadrant - Excessive beta, generalized  IMPRESSION: This study is suggestive of cortical dysfunction arising from left posterior temporal region likely secondary to underlying structural abnormality. Additionally there is generalize  cerebral dysfunction (encephalopathy). The excessive beta activity seen in the background is most likely due to the effect of benzodiazepine and is a benign EEG pattern. No seizures or epileptiform discharges were seen throughout the recording.  Priyanka O Yadav   CT Cervical Spine Wo Contrast Result Date: 11/01/2024 EXAM: CT CERVICAL SPINE WITHOUT CONTRAST 11/01/2024 05:49:25 PM TECHNIQUE: CT of the cervical spine was performed without the administration of intravenous contrast. Multiplanar reformatted images are provided for review. Automated exposure control, iterative reconstruction, and/or weight based adjustment of the mA/kV was utilized to reduce the radiation dose to as low as reasonably achievable. COMPARISON: None available. CLINICAL HISTORY: Polytrauma, blunt; Fall out of emergency department bed. FINDINGS: BONES AND ALIGNMENT: C4 to C7 anterior cervical discectomy and fusion surgical hardware. Diffusely decreased bone density. No acute fracture or traumatic malalignment. DEGENERATIVE CHANGES: Multilevel mild degenerative change of the spine. SOFT TISSUES: No prevertebral soft tissue swelling. IMPRESSION: 1. No evidence of acute traumatic injury. 2. C4 to C7 anterior cervical discectomy and fusion surgical hardware. Electronically signed by: Morgane Naveau MD 11/01/2024 05:57 PM EST RP Workstation: [id number]   CT Head Wo Contrast Result Date: 11/01/2024 EXAM: CT HEAD WITHOUT CONTRAST 11/01/2024 05:49:25 PM TECHNIQUE: CT of the head was performed without the administration of intravenous contrast. Automated exposure control, iterative reconstruction, and/or weight based adjustment of the mA/kV was utilized to reduce the radiation dose to as low as reasonably achievable. COMPARISON: 08/29/2024 CLINICAL HISTORY: Seizure disorder, clinical change. The patient has a seizure disorder and has recently experienced a clinical change. FINDINGS: BRAIN AND VENTRICLES: No acute hemorrhage. No evidence of acute  infarct. No hydrocephalus. No extra-axial collection. No mass effect or midline shift. Proportional prominence of ventricles and sulci, consistent with diffuse cerebral parenchymal volume loss. Patchy and confluent areas of decreased attenuation are noted throughout the deep and periventricular white matter of the cerebral hemispheres bilaterally suggestive of chronic microvascular ischemic changes. ORBITS: Right lens replacement. SINUSES: No acute abnormality. SOFT TISSUES AND SKULL: No acute soft tissue abnormality. No skull fracture. IMPRESSION: 1. No acute intracranial abnormality. Electronically signed by: Morgane Naveau MD 11/01/2024 05:55 PM EST RP Workstation: [id number]   DG Chest Portable 1 View Result Date: 11/01/2024 CLINICAL DATA:  Shortness of breath. EXAM: PORTABLE CHEST 1 VIEW COMPARISON:  08/30/2024 FINDINGS: Lungs are hypoinflated and otherwise clear. Cardiomediastinal silhouette and remainder the exam is unchanged. IMPRESSION: Hypoinflation without acute cardiopulmonary disease. Electronically Signed   By: Toribio Agreste M.D.   On: 11/01/2024 12:52    Microbiology: Results for orders placed or performed during the hospital encounter of 11/01/24  Urine Culture     Status: Abnormal   Collection Time: 11/01/24 11:47 AM   Specimen: Urine, Catheterized  Result Value Ref Range Status   Specimen Description URINE, CATHETERIZED  Final   Special Requests   Final    NONE Reflexed from [id number]  Performed at [facility], [address], [place], [place] [address]    Culture >=100,000 COLONIES/mL ESCHERICHIA COLI (A)  Final   Report Status 11/03/2024 FINAL  Final   Organism ID, Bacteria ESCHERICHIA COLI (A)  Final      Susceptibility   Escherichia coli - MIC*    AMPICILLIN  8 SENSITIVE Sensitive     CEFAZOLIN (URINE) Value in next row Sensitive      2 SENSITIVEThis is a modified FDA-approved test that has been validated and its performance characteristics determined by the reporting  laboratory.  This laboratory is certified under the Clinical Laboratory Improvement Amendments CLIA as qualified to perform high complexity clinical laboratory testing.    CEFEPIME Value in next row Sensitive      2 SENSITIVEThis is a modified FDA-approved test that has been validated and its performance characteristics determined by the reporting laboratory.  This laboratory is certified under the Clinical Laboratory Improvement Amendments CLIA as qualified to perform high complexity clinical laboratory testing.    ERTAPENEM Value in next row Sensitive      2 SENSITIVEThis is a modified FDA-approved test that has been validated and its performance characteristics determined by the reporting laboratory.  This laboratory is certified under the Clinical Laboratory Improvement Amendments CLIA as qualified to perform high complexity clinical laboratory testing.    CEFTRIAXONE  Value in next row Sensitive      2 SENSITIVEThis is a modified FDA-approved test that has been validated and its performance characteristics determined by the reporting laboratory.  This laboratory is certified under the Clinical Laboratory Improvement Amendments CLIA as qualified to perform high complexity clinical laboratory testing.    CIPROFLOXACIN  Value in next row Sensitive      2 SENSITIVEThis is a modified FDA-approved test that has been validated and its performance characteristics determined by the reporting laboratory.  This laboratory is certified under the Clinical Laboratory Improvement Amendments CLIA as qualified to perform high complexity clinical laboratory testing.    GENTAMICIN Value in next row Sensitive      2 SENSITIVEThis is a modified FDA-approved test that has been validated and its performance characteristics determined by the reporting laboratory.  This laboratory is certified under the Clinical Laboratory Improvement Amendments CLIA as qualified to perform high complexity clinical laboratory testing.     NITROFURANTOIN Value in next row Sensitive      2 SENSITIVEThis is a modified FDA-approved test that has been validated and its performance characteristics determined by the reporting laboratory.  This laboratory is certified under the Clinical Laboratory Improvement Amendments CLIA as qualified to perform high complexity clinical laboratory testing.    TRIMETH /SULFA  Value in next row Resistant      2 SENSITIVEThis is a modified FDA-approved test that has been validated and its performance characteristics determined by the reporting laboratory.  This laboratory is certified under the Clinical Laboratory Improvement Amendments CLIA as qualified to perform high complexity clinical laboratory testing.    AMPICILLIN /SULBACTAM Value in next row Sensitive      2 SENSITIVEThis is a modified FDA-approved test that has been validated and its performance characteristics determined by the reporting laboratory.  This laboratory is certified under the Clinical Laboratory Improvement Amendments CLIA as qualified to perform high complexity clinical laboratory testing.    PIP/TAZO Value in next row Sensitive      <=4 SENSITIVEThis is a modified FDA-approved test that has been validated and its performance characteristics determined by the reporting laboratory.  This laboratory is certified under  the Clinical Laboratory Improvement Amendments CLIA as qualified to perform high complexity clinical laboratory testing.    MEROPENEM Value in next row Sensitive      <=4 SENSITIVEThis is a modified FDA-approved test that has been validated and its performance characteristics determined by the reporting laboratory.  This laboratory is certified under the Clinical Laboratory Improvement Amendments CLIA as qualified to perform high complexity clinical laboratory testing.    * >=100,000 COLONIES/mL ESCHERICHIA COLI  MRSA Next Gen by PCR, Nasal     Status: None   Collection Time: 11/02/24 10:31 AM   Specimen: Nasal Mucosa; Nasal  Swab  Result Value Ref Range Status   MRSA by PCR Next Gen NOT DETECTED NOT DETECTED Final    Comment: (NOTE) The GeneXpert MRSA Assay (FDA approved for NASAL specimens only), is one component of a comprehensive MRSA colonization surveillance program. It is not intended to diagnose MRSA infection nor to guide or monitor treatment for MRSA infections. Test performance is not FDA approved in patients less than 82 years old. Performed at [facility], [address], [place], [place] [address]   C Difficile Quick Screen w PCR reflex     Status: None   Collection Time: 11/02/24  2:22 PM   Specimen: STOOL  Result Value Ref Range Status   C Diff antigen NEGATIVE NEGATIVE Final   C Diff toxin NEGATIVE NEGATIVE Final   C Diff interpretation No C. difficile detected.  Final    Comment: Performed at [facility], [address] [place], [place] [address]    Labs: CBC: Recent Labs  Lab 11/01/24 1138 11/01/24 1209 11/02/24 0824 11/03/24 0639 11/04/24 0854  WBC 5.8  --  4.9 4.0 5.6  NEUTROABS 4.6  --   --   --   --   HGB 13.9 16.0* 11.7* 11.3* 14.1  HCT 42.0 47.0* 32.6* 31.6* 40.3  MCV 104.5*  --  97.3 98.1 99.8  PLT 110*  --  86* 102* 138*   Basic Metabolic Panel: Recent Labs  Lab 11/01/24 1138 11/01/24 1209 11/02/24 0824 11/02/24 1703 11/03/24 0249 11/04/24 0854  NA 138 135 137  --  137 136  K 3.0* 2.9* 3.0* 2.5* 2.8*  3.0* 3.7  CL 88* 96* 97*  --  99 96*  CO2 10*  --  26  --  28 24  GLUCOSE 147* 146* 83  --  96 101*  BUN <5* <3* 5*  --  6* <5*  CREATININE 0.67 0.60 0.64  --  0.64 0.58  CALCIUM  8.7*  --  8.5*  --  8.9 9.6  MG 1.7  --  1.3*  --  1.8  --    Liver Function Tests: Recent Labs  Lab 11/01/24 1138 11/02/24 0824 11/03/24 0249 11/04/24 0854  AST 61* 35 32 58*  ALT 25 16 16 21   ALKPHOS 129* 83 80 91  BILITOT 1.1 1.4* 0.9 0.9  PROT 8.1 6.2* 6.1* 7.5  ALBUMIN 4.7 3.8 3.7 4.2   CBG: Recent Labs  Lab 11/01/24 1135 11/02/24 0746  GLUCAP  154* 91    Discharge time spent: greater than 30 minutes.  Signed: Sabas GORMAN Brod, MD [facility] 11/06/2024 "

## 2024-11-06 NOTE — TOC CAGE-AID Note (Signed)
 Transition of Care [facility]) - CAGE-AID Screening   Patient Details  Name: [name] MRN: [id number] Date of Birth: September 28, 1956  [facility]) CM/SW Contact:    Emily JULIANNA George, RN Phone Number: 11/06/2024, 11:54 AM   Clinical Narrative: Pt denied the need for counseling resources   CAGE-AID Screening: Substance Abuse Screening unable to be completed due to: : Patient unable to participate  Have You Ever Felt You Ought to Cut Down on Your Drinking or Drug Use?: No Have People Annoyed You By Critizing Your Drinking Or Drug Use?: Yes Have You Felt Bad Or Guilty About Your Drinking Or Drug Use?: No Have You Ever Had a Drink or Used Drugs First Thing In The Morning to Steady Your Nerves or to Get Rid of a Hangover?: No CAGE-AID Score: 1  Substance Abuse Education Offered: Yes (denied)

## 2024-11-06 NOTE — Telephone Encounter (Signed)
 Pharmacy Patient Advocate Encounter  Received notification from [facility] MEDICARE that Prior Authorization for cloBAZam  10MG  tablets has been APPROVED from 11/06/24 to 10/18/25   PA #/Case ID/Reference #: [id number]

## 2024-11-06 NOTE — TOC Transition Note (Signed)
 Transition of Care [facility]) - Discharge Note   Patient Details  Name: [name] MRN: [id number] Date of Birth: April 29, 1956  Transition [facility]) CM/SW Contact:  Andrez JULIANNA George, RN Phone Number: 11/06/2024, 11:51 AM   Clinical Narrative:     Pt is discharging home with self care. No follow up per PT.  Pt stays at [facility] and states that's where she will return. Her SO Koren should be at [employer]. CM attempted to reach [place] without success. Pt states he will be at the motel. Cab voucher provided to bedside RN.   Final next level of care: Home/Self Care Barriers to Discharge: No Barriers Identified   Patient Goals and CMS Choice            Discharge Placement                       Discharge Plan and Services Additional resources added to the After Visit Summary for                                       Social Drivers of Health (SDOH) Interventions SDOH Screenings   Food Insecurity: Food Insecurity Present (08/30/2024)  Housing: High Risk (08/30/2024)  Transportation Needs: Unmet Transportation Needs (08/30/2024)  Utilities: At Risk (08/30/2024)  Alcohol  Screen: High Risk (06/14/2024)  Financial Resource Strain: High Risk (06/14/2024)  Physical Activity: Inactive (06/14/2024)  Social Connections: Patient Unable To Answer (08/30/2024)  Tobacco Use: High Risk (08/30/2024)     Readmission Risk Interventions     No data to display

## 2024-11-09 ENCOUNTER — Ambulatory Visit: Admitting: Neurology

## 2024-12-29 ENCOUNTER — Ambulatory Visit: Admitting: Neurology
